# Patient Record
Sex: Male | Born: 1940 | Race: White | Hispanic: No | Smoking: Former smoker
Health system: Southern US, Community
[De-identification: ages and names within clinical notes are randomized; demographics above are authoritative.]

## PROBLEM LIST (undated history)

## (undated) DIAGNOSIS — H9190 Unspecified hearing loss, unspecified ear: Secondary | ICD-10-CM

## (undated) DIAGNOSIS — G4730 Sleep apnea, unspecified: Secondary | ICD-10-CM

## (undated) DIAGNOSIS — Z9981 Dependence on supplemental oxygen: Secondary | ICD-10-CM

## (undated) DIAGNOSIS — Z8719 Personal history of other diseases of the digestive system: Secondary | ICD-10-CM

## (undated) DIAGNOSIS — C801 Malignant (primary) neoplasm, unspecified: Secondary | ICD-10-CM

## (undated) DIAGNOSIS — I10 Essential (primary) hypertension: Secondary | ICD-10-CM

## (undated) DIAGNOSIS — J449 Chronic obstructive pulmonary disease, unspecified: Secondary | ICD-10-CM

## (undated) DIAGNOSIS — E785 Hyperlipidemia, unspecified: Secondary | ICD-10-CM

## (undated) DIAGNOSIS — I272 Pulmonary hypertension, unspecified: Secondary | ICD-10-CM

## (undated) DIAGNOSIS — R1310 Dysphagia, unspecified: Secondary | ICD-10-CM

## (undated) DIAGNOSIS — Z9989 Dependence on other enabling machines and devices: Secondary | ICD-10-CM

## (undated) DIAGNOSIS — J189 Pneumonia, unspecified organism: Secondary | ICD-10-CM

## (undated) DIAGNOSIS — J439 Emphysema, unspecified: Secondary | ICD-10-CM

## (undated) DIAGNOSIS — K219 Gastro-esophageal reflux disease without esophagitis: Secondary | ICD-10-CM

## (undated) DIAGNOSIS — N400 Enlarged prostate without lower urinary tract symptoms: Secondary | ICD-10-CM

## (undated) DIAGNOSIS — D649 Anemia, unspecified: Secondary | ICD-10-CM

## (undated) HISTORY — PX: TONSILLECTOMY: SUR1361

## (undated) HISTORY — PX: CATARACT EXTRACTION W/ INTRAOCULAR LENS  IMPLANT, BILATERAL: SHX1307

## (undated) HISTORY — PX: CARDIAC CATHETERIZATION: SHX172

## (undated) HISTORY — PX: LUNG SURGERY: SHX703

## (undated) HISTORY — DX: Emphysema, unspecified: J43.9

---

## 2013-11-22 DIAGNOSIS — K219 Gastro-esophageal reflux disease without esophagitis: Secondary | ICD-10-CM | POA: Diagnosis not present

## 2013-11-22 DIAGNOSIS — I2789 Other specified pulmonary heart diseases: Secondary | ICD-10-CM | POA: Diagnosis not present

## 2013-11-22 DIAGNOSIS — G4700 Insomnia, unspecified: Secondary | ICD-10-CM | POA: Diagnosis not present

## 2013-12-31 DIAGNOSIS — N401 Enlarged prostate with lower urinary tract symptoms: Secondary | ICD-10-CM | POA: Diagnosis not present

## 2013-12-31 DIAGNOSIS — N138 Other obstructive and reflux uropathy: Secondary | ICD-10-CM | POA: Diagnosis not present

## 2014-01-06 DIAGNOSIS — J449 Chronic obstructive pulmonary disease, unspecified: Secondary | ICD-10-CM | POA: Diagnosis not present

## 2014-01-06 DIAGNOSIS — J9610 Chronic respiratory failure, unspecified whether with hypoxia or hypercapnia: Secondary | ICD-10-CM | POA: Diagnosis not present

## 2014-01-06 DIAGNOSIS — I2789 Other specified pulmonary heart diseases: Secondary | ICD-10-CM | POA: Diagnosis not present

## 2014-01-11 DIAGNOSIS — L820 Inflamed seborrheic keratosis: Secondary | ICD-10-CM | POA: Diagnosis not present

## 2014-01-11 DIAGNOSIS — L570 Actinic keratosis: Secondary | ICD-10-CM | POA: Diagnosis not present

## 2014-01-11 DIAGNOSIS — Z85828 Personal history of other malignant neoplasm of skin: Secondary | ICD-10-CM | POA: Diagnosis not present

## 2014-01-11 DIAGNOSIS — D492 Neoplasm of unspecified behavior of bone, soft tissue, and skin: Secondary | ICD-10-CM | POA: Diagnosis not present

## 2014-01-26 DIAGNOSIS — L820 Inflamed seborrheic keratosis: Secondary | ICD-10-CM | POA: Diagnosis not present

## 2014-01-26 DIAGNOSIS — L570 Actinic keratosis: Secondary | ICD-10-CM | POA: Diagnosis not present

## 2014-02-21 DIAGNOSIS — E041 Nontoxic single thyroid nodule: Secondary | ICD-10-CM | POA: Diagnosis not present

## 2014-02-21 DIAGNOSIS — J449 Chronic obstructive pulmonary disease, unspecified: Secondary | ICD-10-CM | POA: Diagnosis not present

## 2014-02-21 DIAGNOSIS — I2789 Other specified pulmonary heart diseases: Secondary | ICD-10-CM | POA: Diagnosis not present

## 2014-02-23 DIAGNOSIS — E041 Nontoxic single thyroid nodule: Secondary | ICD-10-CM | POA: Diagnosis not present

## 2014-05-27 DIAGNOSIS — M2550 Pain in unspecified joint: Secondary | ICD-10-CM | POA: Diagnosis not present

## 2014-05-27 DIAGNOSIS — G4700 Insomnia, unspecified: Secondary | ICD-10-CM | POA: Diagnosis not present

## 2014-06-24 DIAGNOSIS — F5102 Adjustment insomnia: Secondary | ICD-10-CM | POA: Diagnosis not present

## 2014-07-08 DIAGNOSIS — J449 Chronic obstructive pulmonary disease, unspecified: Secondary | ICD-10-CM | POA: Diagnosis not present

## 2014-07-08 DIAGNOSIS — D485 Neoplasm of uncertain behavior of skin: Secondary | ICD-10-CM | POA: Diagnosis not present

## 2014-07-08 DIAGNOSIS — E041 Nontoxic single thyroid nodule: Secondary | ICD-10-CM | POA: Diagnosis not present

## 2014-08-11 DIAGNOSIS — E0789 Other specified disorders of thyroid: Secondary | ICD-10-CM | POA: Diagnosis not present

## 2014-08-11 DIAGNOSIS — E041 Nontoxic single thyroid nodule: Secondary | ICD-10-CM | POA: Diagnosis not present

## 2014-08-22 DIAGNOSIS — L219 Seborrheic dermatitis, unspecified: Secondary | ICD-10-CM | POA: Diagnosis not present

## 2014-08-22 DIAGNOSIS — L820 Inflamed seborrheic keratosis: Secondary | ICD-10-CM | POA: Diagnosis not present

## 2014-08-26 DIAGNOSIS — J449 Chronic obstructive pulmonary disease, unspecified: Secondary | ICD-10-CM | POA: Diagnosis not present

## 2014-08-26 DIAGNOSIS — I2510 Atherosclerotic heart disease of native coronary artery without angina pectoris: Secondary | ICD-10-CM | POA: Diagnosis not present

## 2014-08-26 DIAGNOSIS — R0600 Dyspnea, unspecified: Secondary | ICD-10-CM | POA: Diagnosis not present

## 2014-08-26 DIAGNOSIS — J439 Emphysema, unspecified: Secondary | ICD-10-CM | POA: Diagnosis not present

## 2014-08-26 DIAGNOSIS — Z7189 Other specified counseling: Secondary | ICD-10-CM | POA: Diagnosis not present

## 2014-09-05 DIAGNOSIS — D1801 Hemangioma of skin and subcutaneous tissue: Secondary | ICD-10-CM | POA: Diagnosis not present

## 2014-09-06 DIAGNOSIS — Z23 Encounter for immunization: Secondary | ICD-10-CM | POA: Diagnosis not present

## 2014-09-27 DIAGNOSIS — N401 Enlarged prostate with lower urinary tract symptoms: Secondary | ICD-10-CM | POA: Diagnosis not present

## 2014-09-27 DIAGNOSIS — N390 Urinary tract infection, site not specified: Secondary | ICD-10-CM | POA: Diagnosis not present

## 2014-09-30 DIAGNOSIS — F5101 Primary insomnia: Secondary | ICD-10-CM | POA: Diagnosis not present

## 2014-09-30 DIAGNOSIS — J449 Chronic obstructive pulmonary disease, unspecified: Secondary | ICD-10-CM | POA: Diagnosis not present

## 2014-09-30 DIAGNOSIS — R05 Cough: Secondary | ICD-10-CM | POA: Diagnosis not present

## 2014-10-21 DIAGNOSIS — I2510 Atherosclerotic heart disease of native coronary artery without angina pectoris: Secondary | ICD-10-CM | POA: Diagnosis not present

## 2014-10-21 DIAGNOSIS — J439 Emphysema, unspecified: Secondary | ICD-10-CM | POA: Diagnosis not present

## 2014-10-21 DIAGNOSIS — I272 Other secondary pulmonary hypertension: Secondary | ICD-10-CM | POA: Diagnosis not present

## 2014-10-21 DIAGNOSIS — J449 Chronic obstructive pulmonary disease, unspecified: Secondary | ICD-10-CM | POA: Diagnosis not present

## 2014-10-21 DIAGNOSIS — R0600 Dyspnea, unspecified: Secondary | ICD-10-CM | POA: Diagnosis not present

## 2014-10-25 DIAGNOSIS — J449 Chronic obstructive pulmonary disease, unspecified: Secondary | ICD-10-CM | POA: Diagnosis not present

## 2014-10-25 DIAGNOSIS — R0602 Shortness of breath: Secondary | ICD-10-CM | POA: Diagnosis not present

## 2014-10-31 DIAGNOSIS — L219 Seborrheic dermatitis, unspecified: Secondary | ICD-10-CM | POA: Diagnosis not present

## 2014-11-07 DIAGNOSIS — J449 Chronic obstructive pulmonary disease, unspecified: Secondary | ICD-10-CM | POA: Diagnosis not present

## 2014-11-21 DIAGNOSIS — E041 Nontoxic single thyroid nodule: Secondary | ICD-10-CM | POA: Diagnosis not present

## 2014-12-14 DIAGNOSIS — F5102 Adjustment insomnia: Secondary | ICD-10-CM | POA: Diagnosis not present

## 2015-01-23 DIAGNOSIS — E041 Nontoxic single thyroid nodule: Secondary | ICD-10-CM | POA: Diagnosis not present

## 2015-01-23 DIAGNOSIS — G4700 Insomnia, unspecified: Secondary | ICD-10-CM | POA: Diagnosis not present

## 2015-01-23 DIAGNOSIS — Z23 Encounter for immunization: Secondary | ICD-10-CM | POA: Diagnosis not present

## 2015-01-23 DIAGNOSIS — R64 Cachexia: Secondary | ICD-10-CM | POA: Diagnosis not present

## 2015-01-23 DIAGNOSIS — I272 Other secondary pulmonary hypertension: Secondary | ICD-10-CM | POA: Diagnosis not present

## 2015-02-13 DIAGNOSIS — D485 Neoplasm of uncertain behavior of skin: Secondary | ICD-10-CM | POA: Diagnosis not present

## 2015-02-13 DIAGNOSIS — Z85828 Personal history of other malignant neoplasm of skin: Secondary | ICD-10-CM | POA: Diagnosis not present

## 2015-02-13 DIAGNOSIS — D2262 Melanocytic nevi of left upper limb, including shoulder: Secondary | ICD-10-CM | POA: Diagnosis not present

## 2015-02-13 DIAGNOSIS — I781 Nevus, non-neoplastic: Secondary | ICD-10-CM | POA: Diagnosis not present

## 2015-02-13 DIAGNOSIS — L570 Actinic keratosis: Secondary | ICD-10-CM | POA: Diagnosis not present

## 2015-02-13 DIAGNOSIS — C44519 Basal cell carcinoma of skin of other part of trunk: Secondary | ICD-10-CM | POA: Diagnosis not present

## 2015-02-13 DIAGNOSIS — L814 Other melanin hyperpigmentation: Secondary | ICD-10-CM | POA: Diagnosis not present

## 2015-02-13 DIAGNOSIS — D1801 Hemangioma of skin and subcutaneous tissue: Secondary | ICD-10-CM | POA: Diagnosis not present

## 2015-02-17 DIAGNOSIS — J449 Chronic obstructive pulmonary disease, unspecified: Secondary | ICD-10-CM | POA: Diagnosis not present

## 2015-02-17 DIAGNOSIS — K219 Gastro-esophageal reflux disease without esophagitis: Secondary | ICD-10-CM | POA: Diagnosis not present

## 2015-02-17 DIAGNOSIS — J439 Emphysema, unspecified: Secondary | ICD-10-CM | POA: Diagnosis not present

## 2015-02-17 DIAGNOSIS — Z87891 Personal history of nicotine dependence: Secondary | ICD-10-CM | POA: Diagnosis not present

## 2015-02-17 DIAGNOSIS — R0902 Hypoxemia: Secondary | ICD-10-CM | POA: Diagnosis not present

## 2015-02-17 DIAGNOSIS — I272 Other secondary pulmonary hypertension: Secondary | ICD-10-CM | POA: Diagnosis not present

## 2015-02-27 DIAGNOSIS — L929 Granulomatous disorder of the skin and subcutaneous tissue, unspecified: Secondary | ICD-10-CM | POA: Diagnosis not present

## 2015-02-27 DIAGNOSIS — L570 Actinic keratosis: Secondary | ICD-10-CM | POA: Diagnosis not present

## 2015-02-27 DIAGNOSIS — C44519 Basal cell carcinoma of skin of other part of trunk: Secondary | ICD-10-CM | POA: Diagnosis not present

## 2015-02-27 DIAGNOSIS — D485 Neoplasm of uncertain behavior of skin: Secondary | ICD-10-CM | POA: Diagnosis not present

## 2015-03-29 DIAGNOSIS — N400 Enlarged prostate without lower urinary tract symptoms: Secondary | ICD-10-CM | POA: Diagnosis not present

## 2015-03-29 DIAGNOSIS — N390 Urinary tract infection, site not specified: Secondary | ICD-10-CM | POA: Diagnosis not present

## 2015-03-29 DIAGNOSIS — Z125 Encounter for screening for malignant neoplasm of prostate: Secondary | ICD-10-CM | POA: Diagnosis not present

## 2015-04-26 DIAGNOSIS — E041 Nontoxic single thyroid nodule: Secondary | ICD-10-CM | POA: Diagnosis not present

## 2015-04-26 DIAGNOSIS — Z6825 Body mass index (BMI) 25.0-25.9, adult: Secondary | ICD-10-CM | POA: Diagnosis not present

## 2015-04-26 DIAGNOSIS — E559 Vitamin D deficiency, unspecified: Secondary | ICD-10-CM | POA: Diagnosis not present

## 2015-04-26 DIAGNOSIS — Z125 Encounter for screening for malignant neoplasm of prostate: Secondary | ICD-10-CM | POA: Diagnosis not present

## 2015-04-26 DIAGNOSIS — F419 Anxiety disorder, unspecified: Secondary | ICD-10-CM | POA: Diagnosis not present

## 2015-04-26 DIAGNOSIS — Z131 Encounter for screening for diabetes mellitus: Secondary | ICD-10-CM | POA: Diagnosis not present

## 2015-04-26 DIAGNOSIS — N400 Enlarged prostate without lower urinary tract symptoms: Secondary | ICD-10-CM | POA: Diagnosis not present

## 2015-04-26 DIAGNOSIS — E784 Other hyperlipidemia: Secondary | ICD-10-CM | POA: Diagnosis not present

## 2015-04-26 DIAGNOSIS — Z79899 Other long term (current) drug therapy: Secondary | ICD-10-CM | POA: Diagnosis not present

## 2015-04-26 DIAGNOSIS — J449 Chronic obstructive pulmonary disease, unspecified: Secondary | ICD-10-CM | POA: Diagnosis not present

## 2015-04-26 DIAGNOSIS — I272 Other secondary pulmonary hypertension: Secondary | ICD-10-CM | POA: Diagnosis not present

## 2015-04-26 DIAGNOSIS — N401 Enlarged prostate with lower urinary tract symptoms: Secondary | ICD-10-CM | POA: Diagnosis not present

## 2015-04-26 DIAGNOSIS — I10 Essential (primary) hypertension: Secondary | ICD-10-CM | POA: Diagnosis not present

## 2015-04-26 DIAGNOSIS — R5383 Other fatigue: Secondary | ICD-10-CM | POA: Diagnosis not present

## 2015-04-28 DIAGNOSIS — E042 Nontoxic multinodular goiter: Secondary | ICD-10-CM | POA: Diagnosis not present

## 2015-04-28 DIAGNOSIS — E041 Nontoxic single thyroid nodule: Secondary | ICD-10-CM | POA: Diagnosis not present

## 2015-05-29 DIAGNOSIS — K5900 Constipation, unspecified: Secondary | ICD-10-CM | POA: Diagnosis not present

## 2015-05-29 DIAGNOSIS — Z6825 Body mass index (BMI) 25.0-25.9, adult: Secondary | ICD-10-CM | POA: Diagnosis not present

## 2015-05-29 DIAGNOSIS — F5101 Primary insomnia: Secondary | ICD-10-CM | POA: Diagnosis not present

## 2015-05-29 DIAGNOSIS — R630 Anorexia: Secondary | ICD-10-CM | POA: Diagnosis not present

## 2015-05-29 DIAGNOSIS — E041 Nontoxic single thyroid nodule: Secondary | ICD-10-CM | POA: Diagnosis not present

## 2015-06-26 DIAGNOSIS — D440 Neoplasm of uncertain behavior of thyroid gland: Secondary | ICD-10-CM | POA: Diagnosis not present

## 2015-06-26 DIAGNOSIS — E042 Nontoxic multinodular goiter: Secondary | ICD-10-CM | POA: Diagnosis not present

## 2015-07-31 DIAGNOSIS — L905 Scar conditions and fibrosis of skin: Secondary | ICD-10-CM | POA: Diagnosis not present

## 2015-07-31 DIAGNOSIS — D1801 Hemangioma of skin and subcutaneous tissue: Secondary | ICD-10-CM | POA: Diagnosis not present

## 2015-07-31 DIAGNOSIS — Z85828 Personal history of other malignant neoplasm of skin: Secondary | ICD-10-CM | POA: Diagnosis not present

## 2015-07-31 DIAGNOSIS — L570 Actinic keratosis: Secondary | ICD-10-CM | POA: Diagnosis not present

## 2015-07-31 DIAGNOSIS — L814 Other melanin hyperpigmentation: Secondary | ICD-10-CM | POA: Diagnosis not present

## 2015-08-01 DIAGNOSIS — Z23 Encounter for immunization: Secondary | ICD-10-CM | POA: Diagnosis not present

## 2015-08-17 DIAGNOSIS — K219 Gastro-esophageal reflux disease without esophagitis: Secondary | ICD-10-CM | POA: Diagnosis not present

## 2015-08-17 DIAGNOSIS — I272 Other secondary pulmonary hypertension: Secondary | ICD-10-CM | POA: Diagnosis not present

## 2015-08-17 DIAGNOSIS — J439 Emphysema, unspecified: Secondary | ICD-10-CM | POA: Diagnosis not present

## 2015-08-17 DIAGNOSIS — R0902 Hypoxemia: Secondary | ICD-10-CM | POA: Diagnosis not present

## 2015-08-21 DIAGNOSIS — L820 Inflamed seborrheic keratosis: Secondary | ICD-10-CM | POA: Diagnosis not present

## 2015-08-21 DIAGNOSIS — L720 Epidermal cyst: Secondary | ICD-10-CM | POA: Diagnosis not present

## 2015-08-21 DIAGNOSIS — R208 Other disturbances of skin sensation: Secondary | ICD-10-CM | POA: Diagnosis not present

## 2015-08-24 DIAGNOSIS — R509 Fever, unspecified: Secondary | ICD-10-CM | POA: Diagnosis not present

## 2015-08-24 DIAGNOSIS — B349 Viral infection, unspecified: Secondary | ICD-10-CM | POA: Diagnosis not present

## 2015-08-24 DIAGNOSIS — R0602 Shortness of breath: Secondary | ICD-10-CM | POA: Diagnosis not present

## 2015-08-28 DIAGNOSIS — F5101 Primary insomnia: Secondary | ICD-10-CM | POA: Diagnosis not present

## 2015-08-28 DIAGNOSIS — Z6825 Body mass index (BMI) 25.0-25.9, adult: Secondary | ICD-10-CM | POA: Diagnosis not present

## 2015-09-15 DIAGNOSIS — I10 Essential (primary) hypertension: Secondary | ICD-10-CM | POA: Diagnosis not present

## 2015-09-15 DIAGNOSIS — E785 Hyperlipidemia, unspecified: Secondary | ICD-10-CM | POA: Diagnosis not present

## 2015-09-15 DIAGNOSIS — G4700 Insomnia, unspecified: Secondary | ICD-10-CM | POA: Diagnosis not present

## 2015-09-15 DIAGNOSIS — I272 Other secondary pulmonary hypertension: Secondary | ICD-10-CM | POA: Diagnosis not present

## 2015-09-15 DIAGNOSIS — N400 Enlarged prostate without lower urinary tract symptoms: Secondary | ICD-10-CM | POA: Diagnosis not present

## 2015-09-15 DIAGNOSIS — E041 Nontoxic single thyroid nodule: Secondary | ICD-10-CM | POA: Diagnosis not present

## 2015-09-15 DIAGNOSIS — Z23 Encounter for immunization: Secondary | ICD-10-CM | POA: Diagnosis not present

## 2015-09-15 DIAGNOSIS — Z79899 Other long term (current) drug therapy: Secondary | ICD-10-CM | POA: Diagnosis not present

## 2015-09-15 DIAGNOSIS — K219 Gastro-esophageal reflux disease without esophagitis: Secondary | ICD-10-CM | POA: Diagnosis not present

## 2015-09-15 DIAGNOSIS — J439 Emphysema, unspecified: Secondary | ICD-10-CM | POA: Diagnosis not present

## 2015-10-02 ENCOUNTER — Ambulatory Visit (INDEPENDENT_AMBULATORY_CARE_PROVIDER_SITE_OTHER): Payer: Medicare Other | Admitting: Pulmonary Disease

## 2015-10-02 ENCOUNTER — Encounter: Payer: Self-pay | Admitting: Pulmonary Disease

## 2015-10-02 ENCOUNTER — Ambulatory Visit (INDEPENDENT_AMBULATORY_CARE_PROVIDER_SITE_OTHER)
Admission: RE | Admit: 2015-10-02 | Discharge: 2015-10-02 | Disposition: A | Discharge status: Home / Self Care | Payer: Medicare Other | Source: Ambulatory Visit | Attending: Pulmonary Disease | Admitting: Pulmonary Disease

## 2015-10-02 VITALS — BP 134/68 | HR 81 | Temp 98.1°F | Ht 67.0 in | Wt 155.2 lb

## 2015-10-02 DIAGNOSIS — I272 Other secondary pulmonary hypertension: Secondary | ICD-10-CM

## 2015-10-02 DIAGNOSIS — J438 Other emphysema: Secondary | ICD-10-CM

## 2015-10-02 DIAGNOSIS — Z8679 Personal history of other diseases of the circulatory system: Secondary | ICD-10-CM

## 2015-10-02 DIAGNOSIS — J9611 Chronic respiratory failure with hypoxia: Secondary | ICD-10-CM

## 2015-10-02 DIAGNOSIS — J439 Emphysema, unspecified: Secondary | ICD-10-CM | POA: Insufficient documentation

## 2015-10-02 DIAGNOSIS — R0600 Dyspnea, unspecified: Secondary | ICD-10-CM

## 2015-10-02 NOTE — Patient Instructions (Signed)
Jaren-- it was a pleasure meeting you today!  We discussed establishing a baseline data base for your lung condition>    We checked a CXR, Spriometry breathing test, and an ambulatory oxygen test today...    We will check your Alpha-1-antitrypsin blood level due to your bullous emphysema history...    We will check a follow up 2DEchocardiogram to get an estimate of your pulmonary artery pressure...         We will contact you w/ the results when available...   For now we will continue your stable pulmonary regien>>    [id number]- one inhalation twice daily...    SPIRIVA- inhale the contents of 1 capsule via handihaler daily...    OXYGEN- 2L/min via concentrator w/ pulse-dose administration...    BiPAP as you have been doing w/ settings per [facility]...    REVATIO 52m- one tab thrre times daily as you have done for years...  We will attempt to get your pertinent O[place]   ([place] of [place] we need this info as well)  Call for any questions...  Let's plan a follow up visit in [id number] if needed for problems...Marland KitchenMarland Kitchen

## 2015-10-02 NOTE — Progress Notes (Addendum)
Subjective:     Patient ID: Andrew [name], male   DOB: 1941-06-27, 74 y.o.   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  He describes himself as being rock-solid stable on this exact regimen since 2007- he had Cath (?left & right heart) in 2007, told 1 blockage, good LVF ?right heart results, started on O2, BiPAP, and Revatio but he does not know why?  He notes min cough when supine & in early AM attributed to reflux; min if any sput production, no hemoptysis, he denies SOB but states DOE "if I over-exert" eg- walking, lifting/carrying, stairs, etc; he notes that ADLs are ok- no problem (he is stoic);  He denies CP, palpit, f/c/s, edema... He hasn't been to an ER since 2007 he says & that was also the last time he had any Pred; he thinks he had CXR, PFT, 2DEcho all earlier this yr...    Smoking Hx>  He is an ex-smoker, started in his teens, smoked for 30 yrs up to 1ppd, quit in 1986; This is a 30 pack-yr hx, he does not recall ever being checked for A1AT defic...  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 at 2L/min since 2007;  He tells me that he was started on BiPAP about that same time but he doesn't know why- never had sleep study, not on CPAP prev, he does not know about pCO2 levels etc ("I like the fresh cool air");  His BiPAP came from [facility] in [place]- states he does not know the settings, machine never downloaded, etc;  He has also been on Revatio20Tid since 2007, apparently never tried on other meds, dose never adjusted- he knows about "pulmonary hypertension" but he doesn't know any details and it doesn't appear to have been followed up, and meds kept the same from doctor to doctor...   Medical Hx>  HBP, ?nonobstructive CAD, HL, thyroid nodule, GERD, constipation, BPH, insomnia...  Family Hx>  Father died w/ Emphysema & was a former smoker; no other hx lung dis in the family; Alpha-1 status is unknown...  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   Current Meds>  Oxygen 2L/min pulse-dose concentrator, Advair250Bid, Spiriva daily, Revatio20Tid, CardizemCD240,  Crestor10, Nexium20, Proscar5, Restoril30...   EXAM shows Afeb, VSS, O2sat=93% on 2L/min pulse-dose;  Heent- neg, mallampati1;  Chest- decr BS at bases, can't augment BS voluntarily, w/o w/r/r;  Heart- RR w/o m/r/g;  Abd- soft, neg;  Ext- neg w/o c/c/e;  Neuro- intact...  CXR 10/02/15 showed norm heart size, COPD, bullous emphysema/ hyperinflation, scarring right apex, NAD...   Spirometry 10/02/15 showed FVC=2.70 (69%), FEV1=1.12 (37%), %1sec=41, mid-flows reduced at 18% predicted; this is c/w severe airflow obstruction & GOLD Stage 3 COPD  Ambulatory oxygen saturation test 10/02/15> on O2 at 2L/min pulse-dose concentrator: O2sat=96% on 2L at rest w/ pulse=87; he walked 2 laps w/ his  O2, stopped due to dyspnea, lowest O2sat=89% w/ pulse=113/min...  LAB 10/02/15>  Alpha-1-Antitrypsin level => pending  2DEchocardiogram 10/09/15 showed norm LVF w/ EF=55-60%, norm wall motion, mild MR, mild RA dil, PAsys est59mmHg... Pt on Revatio 20Tid x 59yrs & I rec we wean slowly (Decr to Bid now)...     IMP >>     COPD/ bullous emphysema> s/p RUL "bleb-ectomy" in 2007, severe airflow obstruction w/ GOLD Stage3 COPD> on Advair250Bid & Spiriva daily; apparently he has no use for a rescue inhaler; prev on NEB w/ Albut but not for several yrs.     Chronic hypoxemic respiratory failure on O2 at 2L/min via pulse-dose concentrator...    Pt reports using BiPAP since 2007, never been on CPAP, never had sleep study he says, unknown ABGs or pCO2 data; machine from [place] we will try to get the old data...    Hx pulmonary hypertension on Revatio20Tid since 2007 w/o additional med trials or dose adjustments> we do not have any of the objective data from his prev physician teams... Current 2DEcho w/ PAsys est 29mmHg 7 we will wean the Revatio to Bid at this point...    Medical issues include:  HBP, ?nonobstructive CAD, HL, thyroid nodule, GERD, BPH, insomnia...  PLAN >>     Andrew [name] has a distinctly patchy history to go along w/ his severe airflow obstruction & bullous emphysema;  We really need his old objective data from 2007 when he was started on O2 & BiPAP after his RUL bleb reduction surg;  He will try to get names and numbers for [place]- in the meanwhile we will contact his last physician in [facility] for their more recent data as we establish out data base here in [place];  He is very concerned that he wants [place] to continue his current regimen which has served him well over the last 38yrs;  Continue Advair250Bid, Spiriva daily, Revatio20tid, O2 at 2L/min, and the BiPAP nightly as currently set... We plan ROV recheck in 1 month... NOTE> 2DEcho w/ PAsys est ~58mmHg & we will slowly wean  Revatio...    Past Medical History  Diagnosis Date  . Emphysema lung (HCC)   Medical Hx>  HBP, ?nonobstructive CAD, HL, thyroid nodule, GERD, constipation, BPH, insomnia...   No past surgical history on file. Hx Thoracotomy & RUL Bleb-ectomy 2007 in [place], [place]...   Outpatient Encounter Prescriptions as of 10/02/2015  Medication Sig  . ADVAIR DISKUS 250-50 MCG/DOSE AEPB 1 puff 2 (two) times daily.  Marland Kitchen diltiazem (CARDIZEM LA) 120 MG 24 hr tablet Take 240 mg by mouth daily.  Marland Kitchen esomeprazole (NEXIUM) 20 MG capsule Take 20 mg by mouth daily at 12 noon.  . finasteride (PROSCAR) 5 MG tablet Take 10 mg by mouth daily.  . rosuvastatin (CRESTOR) 10 MG tablet Take 10 mg by mouth  daily.  . sildenafil (REVATIO) 20 MG tablet Take 20 mg by mouth 3 (three) times daily.  Marland Kitchen SPIRIVA HANDIHALER 18 MCG inhalation capsule Place 1 capsule into inhaler and inhale daily.  . temazepam (RESTORIL) 30 MG capsule Take 30 mg by mouth at bedtime.   No facility-administered encounter medications on file as of 10/02/2015.    No Known Allergies   Family History  Problem Relation Age of Onset  . Bone cancer Maternal Uncle   Father died at 80 from emphysema, he was a smoker... Mother died at 72 from pancreatic cancer... He has 2 half-brothers and 3 half-sisters; no hx lung diseases, one Bro w/ prostate Ca...   Social History   Social History  . Marital Status: Divorced    Spouse Name: N/A  . Number of Children: N/A  . Years of Education: N/A   Occupational History  . Not on file.   Social History Main Topics  . Smoking status: Former Smoker -- 1.00 packs/day for 30 years    Types: Cigarettes    Quit date: 103  . Smokeless tobacco: Not on file  . Alcohol Use: Not on file  . Drug Use: Not on file  . Sexual Activity: Not on file   Other Topics Concern  . Not on file   Social History Narrative  . No narrative on file    Current Medications, Allergies, Past Medical History, Past Surgical  History, Family History, and Social History were reviewed in [facility] record.   Review of Systems             All symptoms NEG except where BOLDED >>  Constitutional:  F/C/S, fatigue, anorexia, unexpected weight change. HEENT:  HA, visual changes, hearing loss, earache, nasal symptoms, sore throat, mouth sores, hoarseness. Resp:  cough, sputum, hemoptysis; SOB, tightness, wheezing. Cardio:  CP, palpit, DOE, orthopnea, edema. GI:  N/V/D/C, blood in stool; reflux, abd pain, distention, gas. GU:  dysuria, freq, urgency, hematuria, flank pain, voiding difficulty. MS:  joint pain, swelling, tenderness, decr ROM; neck pain, back pain, etc. Neuro:  HA, tremors, seizures, dizziness, syncope, weakness, numbness, gait abn. Skin:  suspicious lesions or skin rash. Heme:  adenopathy, bruising, bleeding. Psyche:  confusion, agitation, sleep disturbance, hallucinations, anxiety, depression suicidal.   Objective:   Physical Exam       Vital Signs:  Reviewed...  General:  WD, WN, 74 y/o WM in NAD; alert & oriented; pleasant & cooperative... HEENT:  [place]/AT; Conjunctiva- pink, Sclera- nonicteric, EOM-wnl, PERRLA, EACs-clear, TMs-wnl; NOSE-clear; THROAT-clear & wnl. Neck:  Supple w/ fair ROM; no JVD; normal carotid impulses w/o bruits; no thyromegaly +small nodule palpated; no lymphadenopathy. Chest:  Overinflated, resonant percussion note, decr BS bilat & can't augment BS voluntarily, no w/r/r heard... Heart:  Regular Rhythm; norm S1 & S2 without murmurs, rubs, or gallops detected. Abdomen:  Soft & nontender- no guarding or rebound; normal bowel sounds; no organomegaly or masses palpated. Ext:  decr ROM; without deformities or arthritic changes; no varicose veins, +venous insuffic, no edema;  Pulses intact w/o bruits. Neuro:  No focal neuro deficits; sensory testing normal; gait normal & balance OK. Derm:  No lesions noted; no rash etc. Lymph:  No cervical, supraclavicular,  axillary, or inguinal adenopathy palpated.   Assessment:      IMP >>     COPD/ bullous emphysema> s/p RUL "bleb-ectomy" in 2007, severe airflow obstruction w/ GOLD Stage3 COPD> on Advair250Bid & Spiriva daily; apparently he has no use for a  rescue inhaler; prev on NEB w/ Albut but not for several yrs.     Chronic hypoxemic respiratory failure on O2 at 2L/min via pulse-dose concentrator...    Pt reports using BiPAP since 2007, never been on CPAP, never had sleep study he says, unknown ABGs or pCO2 data; machine from [place] we will try to get the old data...    Hx pulmonary hypertension on Revatio20Tid since 2007 w/o additional med trials or dose adjustments> we do not have any of the objective data from his prev physician teams...    Medical issues include:  HBP, ?nonobstructive CAD, HL, thyroid nodule, GERD, BPH, insomnia...  PLAN >>     Andrew [name] has a distinctly patchy history to go along w/ his severe airflow obstruction & bullous emphysema;  We really need his old objective data from 2007 when he was started on O2 & BiPAP after his RUL bleb reduction surg;  He will try to get names and numbers for [place]- in the meanwhile we will contact his last physician in [facility] for their more recent data as we establish out data base here in [place];  He is very concerned that he wants [place] to continue his current regimen which has served him well over the last 79yrs;  Continue Advair250Bid, Spiriva daily, Revatio20tid, O2 at 2L/min, and the BiPAP nightly as currently set... We plan ROV recheck in 1 month...     Plan:     Patient's Medications  New Prescriptions   No medications on file  Previous Medications   ADVAIR DISKUS 250-50 MCG/DOSE AEPB    1 puff 2 (two) times daily.   DILTIAZEM (CARDIZEM LA) 120 MG 24 HR TABLET    Take 240 mg by mouth daily.   ESOMEPRAZOLE (NEXIUM) 20 MG CAPSULE    Take 20 mg by mouth daily at 12 noon.   FINASTERIDE (PROSCAR) 5 MG TABLET    Take 10 mg by mouth daily.    ROSUVASTATIN (CRESTOR) 10 MG TABLET    Take 10 mg by mouth daily.   SILDENAFIL (REVATIO) 20 MG TABLET    Take 20 mg by mouth 3 (three) times daily.   SPIRIVA HANDIHALER 18 MCG INHALATION CAPSULE    Place 1 capsule into inhaler and inhale daily.   TEMAZEPAM (RESTORIL) 30 MG CAPSULE    Take 30 mg by mouth at bedtime.  Modified Medications   No medications on file  Discontinued Medications   No medications on file

## 2015-10-09 ENCOUNTER — Other Ambulatory Visit: Payer: Self-pay

## 2015-10-09 ENCOUNTER — Ambulatory Visit (HOSPITAL_COMMUNITY): Payer: Medicare Other | Attending: Cardiology

## 2015-10-09 DIAGNOSIS — Z8679 Personal history of other diseases of the circulatory system: Secondary | ICD-10-CM

## 2015-10-09 DIAGNOSIS — J9611 Chronic respiratory failure with hypoxia: Secondary | ICD-10-CM

## 2015-10-09 DIAGNOSIS — I272 Other secondary pulmonary hypertension: Secondary | ICD-10-CM | POA: Diagnosis not present

## 2015-10-09 DIAGNOSIS — I071 Rheumatic tricuspid insufficiency: Secondary | ICD-10-CM | POA: Diagnosis not present

## 2015-10-09 DIAGNOSIS — I340 Nonrheumatic mitral (valve) insufficiency: Secondary | ICD-10-CM | POA: Diagnosis not present

## 2015-10-09 DIAGNOSIS — J438 Other emphysema: Secondary | ICD-10-CM

## 2015-10-09 DIAGNOSIS — R0600 Dyspnea, unspecified: Secondary | ICD-10-CM | POA: Diagnosis not present

## 2015-10-10 ENCOUNTER — Other Ambulatory Visit (HOSPITAL_COMMUNITY): Payer: Medicare Other

## 2015-10-10 DIAGNOSIS — D485 Neoplasm of uncertain behavior of skin: Secondary | ICD-10-CM | POA: Diagnosis not present

## 2015-10-10 DIAGNOSIS — D0461 Carcinoma in situ of skin of right upper limb, including shoulder: Secondary | ICD-10-CM | POA: Diagnosis not present

## 2015-10-10 DIAGNOSIS — L570 Actinic keratosis: Secondary | ICD-10-CM | POA: Diagnosis not present

## 2015-10-10 DIAGNOSIS — J439 Emphysema, unspecified: Secondary | ICD-10-CM | POA: Diagnosis not present

## 2015-10-10 DIAGNOSIS — I10 Essential (primary) hypertension: Secondary | ICD-10-CM | POA: Diagnosis not present

## 2015-10-10 DIAGNOSIS — D225 Melanocytic nevi of trunk: Secondary | ICD-10-CM | POA: Diagnosis not present

## 2015-10-10 DIAGNOSIS — L821 Other seborrheic keratosis: Secondary | ICD-10-CM | POA: Diagnosis not present

## 2015-10-19 NOTE — Progress Notes (Signed)
Quick Note:  Called and spoke with patient. Informed him of results and recs. Patient voiced understanding and had no further questions. ______ 

## 2015-10-30 DIAGNOSIS — D045 Carcinoma in situ of skin of trunk: Secondary | ICD-10-CM | POA: Diagnosis not present

## 2015-11-01 ENCOUNTER — Encounter: Payer: Self-pay | Admitting: Pulmonary Disease

## 2015-11-01 ENCOUNTER — Telehealth: Payer: Self-pay | Admitting: Pulmonary Disease

## 2015-11-01 ENCOUNTER — Ambulatory Visit (INDEPENDENT_AMBULATORY_CARE_PROVIDER_SITE_OTHER): Payer: Medicare Other | Admitting: Pulmonary Disease

## 2015-11-01 VITALS — BP 132/60 | HR 83 | Temp 97.6°F | Wt 150.0 lb

## 2015-11-01 DIAGNOSIS — J438 Other emphysema: Secondary | ICD-10-CM | POA: Diagnosis not present

## 2015-11-01 DIAGNOSIS — I272 Other secondary pulmonary hypertension: Secondary | ICD-10-CM | POA: Diagnosis not present

## 2015-11-01 DIAGNOSIS — Z8679 Personal history of other diseases of the circulatory system: Secondary | ICD-10-CM | POA: Diagnosis not present

## 2015-11-01 DIAGNOSIS — R0600 Dyspnea, unspecified: Secondary | ICD-10-CM

## 2015-11-01 DIAGNOSIS — J9611 Chronic respiratory failure with hypoxia: Secondary | ICD-10-CM

## 2015-11-01 MED ORDER — ADVAIR DISKUS 250-50 MCG/DOSE IN AEPB: 1.0000 | INHALATION_SPRAY | Freq: Two times a day (BID) | RESPIRATORY_TRACT | Status: DC

## 2015-11-01 MED ORDER — AZITHROMYCIN 250 MG PO TABS: ORAL_TABLET | ORAL | Status: DC

## 2015-11-01 MED ORDER — SPIRIVA HANDIHALER 18 MCG IN CAPS: 1.0000 | ORAL_CAPSULE | Freq: Every day | RESPIRATORY_TRACT | Status: DC

## 2015-11-01 NOTE — Telephone Encounter (Signed)
Orders cancelled at local pharmacy and submitted to [facility] per request  Nothing further is needed

## 2015-11-01 NOTE — Patient Instructions (Signed)
Today we updated your med list in our EPIC system...    Continue your current medications the same...  We added a ZPak to use during the winter months as needed for infection...  Call for any questions...  Let's plan a follow up visit in 3-62mo, sooner if needed for problems.Marland KitchenMarland Kitchen

## 2015-11-01 NOTE — Progress Notes (Signed)
Subjective:     Patient ID: Andrew [name], male   DOB: 09/18/1941, 74 y.o.   MRN: [id number]  HPI  ~  October 02, 2015:  Initial pulmonary evaluation by SN>  His PCP is Dr. Lujean Amel, [name]...       Andrew [name] is a 74 y/o gentleman from [place]- moved here to [place] ~40mo ago to live w/ his daughter & son-in-law;  He has a long convoluted history & we have none of his prev objective data to review;  He tells me that he has known about COPD/Emphysema for 74 yrs and in 2007 he had right thoracotomy & "bleb-ectomy";  After this procedure he was placed on Oxygen at 2L/min and BiPAP to use at night, along w/ ADVAIR250Bid & SPIRIVA daily, plus REVATIO20Tid for pulmonary HTN;  He was also treated w/ NEBS for about 1-64yrs then this was discontinued;  He has pretty much been on this same regimen for the past 9 yrs w/o much change, despite or maybe because he has moved around a lot- [place] (surg done there in 2007), to [place], back to [place], on to [place] for 6 yrs, then back to [place] over the last 74 yr or so...  He describes himself as being rock-solid stable on this exact regimen since 2007- he had Cath (?left & right heart) in 2007, told 1 blockage, good LVF ?right heart results, started on O2, BiPAP, and Revatio but he does not know why?  He notes min cough when supine & in early AM attributed to reflux; min if any sput production, no hemoptysis, he denies SOB but states DOE "if I over-exert" eg- walking, lifting/carrying, stairs, etc; he notes that ADLs are ok- no problem (he is stoic);  He denies CP, palpit, f/c/s, edema... He hasn't been to an ER since 2007 he says & that was also the last time he had any Pred; he thinks he had CXR, PFT, 2DEcho all earlier this yr...   Smoking Hx>  He is an ex-smoker, started in his teens, smoked for 30 yrs up to 1ppd, quit in 1986; This is a 30 pack-yr hx, he does not recall ever being checked for A1AT defic...  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 at 2L/min since 2007;  He tells me that he was started on BiPAP about that same time but he doesn't know why- never had sleep study, not on CPAP prev, he does not know about pCO2 levels etc ("I like the fresh cool air");  His BiPAP came from [facility] in [place]- states he does not know the settings, machine never downloaded, etc;  He has also been on Revatio20Tid since 2007, apparently never tried on other meds, dose never adjusted- he knows about "pulmonary hypertension" but he doesn't know any details and it doesn't appear to have been followed up, and meds kept the same from doctor to doctor...   Medical Hx>  HBP, ?nonobstructive CAD, HL, thyroid nodule, GERD, constipation, BPH, insomnia...  Family Hx>  Father died w/ Emphysema & was a former smoker; no other hx lung dis in the family; Alpha-1 status is unknown...  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   Current Meds>  Oxygen  2L/min pulse-dose concentrator, Advair250Bid, Spiriva daily, Revatio20Tid, CardizemCD240, Crestor10, Nexium20, Proscar5, Restoril30...  EXAM shows Afeb, VSS, O2sat=93% on 2L/min pulse-dose;  Heent- neg, mallampati1;  Chest- decr BS at bases, can't augment BS voluntarily, w/o w/r/r;  Heart- RR w/o m/r/g;  Abd- soft, neg;  Ext- neg w/o c/c/e;  Neuro- intact...  CXR 10/02/15 showed norm heart size, COPD, bullous emphysema/ hyperinflation, scarring right apex, NAD...   Spirometry 10/02/15 showed FVC=2.70 (69%), FEV1=1.12 (37%), %1sec=41, mid-flows reduced at 18% predicted; this is c/w severe airflow obstruction & GOLD Stage 3 COPD  Ambulatory oxygen saturation test 10/02/15> on O2 at 2L/min pulse-dose concentrator: O2sat=96% on 2L  at rest w/ pulse=87; he walked 2 laps w/ his O2, stopped due to dyspnea, lowest O2sat=89% w/ pulse=113/min...  LAB 10/02/15>  Alpha-1-Antitrypsin level => pending (he never went to the lab for this blood test)  2DEchocardiogram 10/09/15 showed norm LVF w/ EF=55-60%, norm wall motion, mild MR, mild RA dil, PAsys est 43mmHg... Pt on Revatio 20Tid x 74yrs & I rec we wean slowly (Decr to Bid now)...    IMP >>     COPD/ bullous emphysema> s/p RUL "bleb-ectomy" in 2007, severe airflow obstruction w/ GOLD Stage3 COPD> on Advair250Bid & Spiriva daily; apparently he has no use for a rescue inhaler; prev on NEB w/ Albut but not for several yrs.     Chronic hypoxemic respiratory failure on O2 at 2L/min via pulse-dose concentrator...    Pt reports using BiPAP since 2007, never been on CPAP, never had sleep study he says, unknown ABGs or pCO2 data; machine from [facility] we will try to get the old data => none received.    Hx pulmonary hypertension on Revatio20Tid since 2007 w/o additional med trials or dose adjustments> we do not have any of the objective data from his prev physician teams... Current 2DEcho w/ PAsys est 103mmHg & we will wean the Revatio to Bid at this point => he does not want to wean further for "other" reasons...    Medical issues include:  HBP, ?nonobstructive CAD, HL, thyroid nodule, GERD, BPH, insomnia... PLAN >>     Andrew [name] has a distinctly patchy history to go along w/ his severe airflow obstruction & bullous emphysema;  We really need his old objective data from 2007 when he was started on O2 & BiPAP after his RUL bleb reduction surg;  He will try to get names and numbers for [place]- in the meanwhile we will contact his last physician in [facility] for their more recent data as we establish out data base here in [place];  He is very concerned that he wants [place] to continue his current regimen which has served him well over the last 76yrs;  Continue Advair250Bid, Spiriva daily,  Revatio20Tid=>Bid, O2 at 2L/min, and the BiPAP nightly as currently set... We plan ROV recheck in 1 month... NOTE> 2DEcho w/ PAsys est ~61mmHg & we will slowly wean Revatio...  ~  November 01, 2015:  81mo ROV & Andrew [name] reports stable, doing satis & notes no untoward effects from cutting the Revatio to 20mg Bid; he denies CP, palpit, incr SOB, edema, etc; he continues on the O2 at 2L/min, BiPAP from [facility], Advair250Bid & Spiriva once daily; we have not received any records from his mult physicians ([place], [place], [place])...    EXAM shows Afeb, VSS, O2sat=92% on 2L/min pulse-dose;  Heent- neg, mallampati1;  Chest- decr BS at bases, can't augment BS voluntarily, w/o w/r/r;  Heart- RR w/o m/r/g;  Abd-  soft, neg;  Ext- neg w/o c/c/e;  Neuro- intact... IMP/PLAN>>  See prob list above- he is stable on this regimen but does not want to wean the Revatio further for "other" reasons; OK to continue current med regimen- advised regular exercise vs pulm rehab program; given ZPak for prn use over the winter & he knows to call for any resp issues, incr dyspnea, etc... He remains on BiPAP but we do not have any data- no records received from prev pulm physicians, no notes from [place], no download data from his machine- we will again try to make contact w/ his DME company... We plan ROV in 3-22mo.    Past Medical History  Diagnosis Date  . Emphysema lung (HCC)   Medical Hx>  HBP, ?nonobstructive CAD, HL, thyroid nodule, GERD, constipation, BPH, insomnia... Meds include>  CardizemCD240, Crestor10, Nexium20, Miralax, Proscar10, Restoril30...   No past surgical history on file. Hx Thoracotomy & RUL Bleb-ectomy 2007 in [place], [place]...   Outpatient Encounter Prescriptions as of 11/01/2015  Medication Sig  . calcium citrate (CALCITRATE - DOSED IN MG ELEMENTAL CALCIUM) 950 MG tablet Take 200 mg of elemental calcium by mouth daily.  Marland Kitchen diltiazem (CARDIZEM LA) 120 MG 24 hr tablet Take 240 mg by mouth daily.    Marland Kitchen esomeprazole (NEXIUM) 20 MG capsule Take 20 mg by mouth daily at 12 noon.  . finasteride (PROSCAR) 5 MG tablet Take 10 mg by mouth daily.  . Multiple Vitamins-Minerals (MULTIVITAMIN WITH MINERALS) tablet Take 1 tablet by mouth daily.  . polyethylene glycol (MIRALAX / GLYCOLAX) packet Take 17 g by mouth at bedtime.  . rosuvastatin (CRESTOR) 10 MG tablet Take 10 mg by mouth daily.  . sildenafil (REVATIO) 20 MG tablet Take 20 mg by mouth 2 (two) times daily.   . temazepam (RESTORIL) 30 MG capsule Take 30 mg by mouth at bedtime.  . vitamin E 1000 UNIT capsule Take 1,000 Units by mouth daily.  Marland Kitchen ADVAIR DISKUS 250-50 MCG/DOSE AEPB Inhale 1 puff into the lungs 2 (two) times daily.  Marland Kitchen SPIRIVA HANDIHALER 18 MCG inhalation capsule Place 1 capsule (18 mcg total) into inhaler and inhale daily.    No Known Allergies   Family History  Problem Relation Age of Onset  . Bone cancer Maternal Uncle   Father died at 70 from emphysema, he was a smoker... Mother died at 64 from pancreatic cancer... He has 2 half-brothers and 3 half-sisters; no hx lung diseases, one Bro w/ prostate Ca...   Current Medications, Allergies, Past Medical History, Past Surgical History, Family History, and Social History were reviewed in [facility] record.   Review of Systems             All symptoms NEG except where BOLDED >>  Constitutional:  F/C/S, fatigue, anorexia, unexpected weight change. HEENT:  HA, visual changes, hearing loss, earache, nasal symptoms, sore throat, mouth sores, hoarseness. Resp:  cough, sputum, hemoptysis; SOB, tightness, wheezing. Cardio:  CP, palpit, DOE, orthopnea, edema. GI:  N/V/D/C, blood in stool; reflux, abd pain, distention, gas. GU:  dysuria, freq, urgency, hematuria, flank pain, voiding difficulty. MS:  joint pain, swelling, tenderness, decr ROM; neck pain, back pain, etc. Neuro:  HA, tremors, seizures, dizziness, syncope, weakness, numbness, gait abn. Skin:   suspicious lesions or skin rash. Heme:  adenopathy, bruising, bleeding. Psyche:  confusion, agitation, sleep disturbance, hallucinations, anxiety, depression suicidal.   Objective:   Physical Exam       Vital Signs:  Reviewed...  General:  WD, WN, 74 y/o WM in NAD; alert & oriented; pleasant & cooperative... HEENT:  Andrew [name]/AT; Conjunctiva- pink, Sclera- nonicteric, EOM-wnl, PERRLA, EACs-clear, TMs-wnl; NOSE-clear; THROAT-clear & wnl. Neck:  Supple w/ fair ROM; no JVD; normal carotid impulses w/o bruits; no thyromegaly +small nodule palpated; no lymphadenopathy. Chest:  Overinflated, resonant percussion note, decr BS bilat & can't augment BS voluntarily, no w/r/r heard... Heart:  Regular Rhythm; norm S1 & S2 without murmurs, rubs, or gallops detected. Abdomen:  Soft & nontender- no guarding or rebound; normal bowel sounds; no organomegaly or masses palpated. Ext:  decr ROM; without deformities or arthritic changes; no varicose veins, +venous insuffic, no edema;  Pulses intact w/o bruits. Neuro:  No focal neuro deficits; sensory testing normal; gait normal & balance OK. Derm:  No lesions noted; no rash etc. Lymph:  No cervical, supraclavicular, axillary, or inguinal adenopathy palpated.   Assessment:      IMP >>     1) COPD/ bullous emphysema> s/p RUL "bleb-ectomy" in 2007, severe airflow obstruction w/ GOLD Stage3 COPD> on Advair250Bid & Spiriva daily; apparently he has no use for a rescue inhaler; prev on NEB w/ Albut but not for several yrs.     2) Chronic hypoxemic respiratory failure on O2 at 2L/min via pulse-dose concentrator...    3) Pt reports using BiPAP since 2007, never been on CPAP, never had sleep study he says, unknown ABGs or pCO2 data; machine from [place] [facility] we will try to get the old data...    4) Hx pulmonary hypertension on Revatio20Tid since 2007 w/o additional med trials or dose adjustments> we do not have any of the objective data from his prev physician teams.Marland KitchenMarland Kitchen    5)  Medical issues include:  HBP, ?nonobstructive CAD, HL, thyroid nodule, GERD, BPH, insomnia...  PLAN >>  11/14>   [place] has a distinctly patchy history to go along w/ his severe airflow obstruction & bullous emphysema;  We really need his old objective data from 2007 when he was started on O2 & BiPAP after his RUL bleb reduction surg;  He will try to get names and numbers for [place]- in the meanwhile we will contact his last physician in [facility] for their more recent data as we establish out data base here in [place];  He is very concerned that he wants [place] to continue his current regimen which has served him well over the last 31yrs;  Continue Advair250Bid, Spiriva daily, Revatio20tid, O2 at 2L/min, and the BiPAP nightly as currently set...  12/14>  See prob list above- he is stable on this regimen but does not want to wean the Revatio further for "other" reasons; OK to continue current med regimen- advised regular exercise vs pulm rehab program; given ZPak for prn use over the winter & he knows to call for any resp issues, incr dyspnea, etc... He remains on BiPAP but we do not have any data- no records received from prev pulm physicians, no notes from [place], no download data from his machine- we will again try to make contact w/ his DME company...     Plan:     Patient's Medications  New Prescriptions   AZITHROMYCIN (ZITHROMAX Z-PAK) 250 MG TABLET    Use as directed  Previous Medications   CALCIUM CITRATE (CALCITRATE - DOSED IN MG ELEMENTAL CALCIUM) 950 MG TABLET    Take 200 mg of elemental calcium by mouth daily.   DILTIAZEM (CARDIZEM LA) 120 MG 24 HR TABLET    Take 240 mg  by mouth daily.   ESOMEPRAZOLE (NEXIUM) 20 MG CAPSULE    Take 20 mg by mouth daily at 12 noon.   FINASTERIDE (PROSCAR) 5 MG TABLET    Take 10 mg by mouth daily.   MULTIPLE VITAMINS-MINERALS (MULTIVITAMIN WITH MINERALS) TABLET    Take 1 tablet by mouth daily.   POLYETHYLENE GLYCOL (MIRALAX / GLYCOLAX) PACKET    Take 17 g by  mouth at bedtime.   ROSUVASTATIN (CRESTOR) 10 MG TABLET    Take 10 mg by mouth daily.   SILDENAFIL (REVATIO) 20 MG TABLET    Take 20 mg by mouth 2 (two) times daily.    TEMAZEPAM (RESTORIL) 30 MG CAPSULE    Take 30 mg by mouth at bedtime.   VITAMIN E 1000 UNIT CAPSULE    Take 1,000 Units by mouth daily.  Modified Medications   Modified Medication Previous Medication   ADVAIR DISKUS 250-50 MCG/DOSE AEPB ADVAIR DISKUS 250-50 MCG/DOSE AEPB      Inhale 1 puff into the lungs 2 (two) times daily.    1 puff 2 (two) times daily.   SPIRIVA HANDIHALER 18 MCG INHALATION CAPSULE SPIRIVA HANDIHALER 18 MCG inhalation capsule      Place 1 capsule (18 mcg total) into inhaler and inhale daily.    Place 1 capsule into inhaler and inhale daily.  Discontinued Medications   No medications on file

## 2015-11-28 ENCOUNTER — Telehealth: Payer: Self-pay | Admitting: Pulmonary Disease

## 2015-11-28 DIAGNOSIS — G4733 Obstructive sleep apnea (adult) (pediatric): Secondary | ICD-10-CM

## 2015-11-28 NOTE — Telephone Encounter (Signed)
Called # provided and went straight to VM--LMTCB x1

## 2015-11-29 ENCOUNTER — Telehealth: Payer: Self-pay | Admitting: Pulmonary Disease

## 2015-11-29 DIAGNOSIS — G4733 Obstructive sleep apnea (adult) (pediatric): Secondary | ICD-10-CM

## 2015-11-29 NOTE — Telephone Encounter (Signed)
Spoke with pt. States that he needs an order sent to [facility] for BiPAP supplies. Order will be sent in. Nothing further was needed.

## 2015-11-29 NOTE — Telephone Encounter (Signed)
Gilda from [facility] returned call.  Needing the patient's sleep study fax [phone].  CB is [phone].

## 2015-11-29 NOTE — Telephone Encounter (Signed)
Per SN >> pt will need to repeat sleep study.  Pt is aware that he will need to repeat his sleep study. Order has been placed for a split night study. lmtcb for [facility] to make them aware of this.

## 2015-11-29 NOTE — Telephone Encounter (Signed)
There isn't a sleep study in the system. lmtcb x1 for pt.

## 2015-11-29 NOTE — Telephone Encounter (Signed)
Spoke with [facility]'s answering service. They will call us back when they return from lunch.

## 2015-11-29 NOTE — Telephone Encounter (Signed)
Pt returning call and can be reached @ same.Andrew [name] ° °

## 2015-11-29 NOTE — Telephone Encounter (Signed)
Pt states that he does not have a copy of this study - states it was done about 10+ years ago.  Will send to Dr Lenna Gilford to advise. Thanks.

## 2015-11-30 NOTE — Telephone Encounter (Signed)
Spoke with Bethanne Ginger at [facility] to make aware.  Nothing further needed at this time.

## 2015-12-08 DIAGNOSIS — J439 Emphysema, unspecified: Secondary | ICD-10-CM | POA: Diagnosis not present

## 2015-12-08 DIAGNOSIS — J019 Acute sinusitis, unspecified: Secondary | ICD-10-CM | POA: Diagnosis not present

## 2015-12-08 DIAGNOSIS — D229 Melanocytic nevi, unspecified: Secondary | ICD-10-CM | POA: Diagnosis not present

## 2015-12-18 DIAGNOSIS — I10 Essential (primary) hypertension: Secondary | ICD-10-CM | POA: Diagnosis not present

## 2015-12-18 DIAGNOSIS — E785 Hyperlipidemia, unspecified: Secondary | ICD-10-CM | POA: Diagnosis not present

## 2015-12-18 DIAGNOSIS — I272 Other secondary pulmonary hypertension: Secondary | ICD-10-CM | POA: Diagnosis not present

## 2015-12-18 DIAGNOSIS — Z136 Encounter for screening for cardiovascular disorders: Secondary | ICD-10-CM | POA: Diagnosis not present

## 2015-12-18 DIAGNOSIS — Z79899 Other long term (current) drug therapy: Secondary | ICD-10-CM | POA: Diagnosis not present

## 2015-12-18 DIAGNOSIS — G4700 Insomnia, unspecified: Secondary | ICD-10-CM | POA: Diagnosis not present

## 2015-12-18 DIAGNOSIS — N400 Enlarged prostate without lower urinary tract symptoms: Secondary | ICD-10-CM | POA: Diagnosis not present

## 2015-12-18 DIAGNOSIS — Z0001 Encounter for general adult medical examination with abnormal findings: Secondary | ICD-10-CM | POA: Diagnosis not present

## 2015-12-18 DIAGNOSIS — E041 Nontoxic single thyroid nodule: Secondary | ICD-10-CM | POA: Diagnosis not present

## 2015-12-18 DIAGNOSIS — J439 Emphysema, unspecified: Secondary | ICD-10-CM | POA: Diagnosis not present

## 2015-12-18 DIAGNOSIS — K219 Gastro-esophageal reflux disease without esophagitis: Secondary | ICD-10-CM | POA: Diagnosis not present

## 2015-12-20 ENCOUNTER — Other Ambulatory Visit: Payer: Self-pay | Admitting: Family Medicine

## 2015-12-20 DIAGNOSIS — Z136 Encounter for screening for cardiovascular disorders: Secondary | ICD-10-CM

## 2015-12-20 DIAGNOSIS — E041 Nontoxic single thyroid nodule: Secondary | ICD-10-CM

## 2015-12-22 ENCOUNTER — Ambulatory Visit
Admission: RE | Admit: 2015-12-22 | Discharge: 2015-12-22 | Disposition: A | Discharge status: Home / Self Care | Payer: Medicare Other | Source: Ambulatory Visit | Attending: Family Medicine | Admitting: Family Medicine

## 2015-12-22 DIAGNOSIS — Z136 Encounter for screening for cardiovascular disorders: Secondary | ICD-10-CM | POA: Diagnosis not present

## 2015-12-22 DIAGNOSIS — E042 Nontoxic multinodular goiter: Secondary | ICD-10-CM | POA: Diagnosis not present

## 2015-12-22 DIAGNOSIS — E041 Nontoxic single thyroid nodule: Secondary | ICD-10-CM

## 2015-12-26 DIAGNOSIS — N401 Enlarged prostate with lower urinary tract symptoms: Secondary | ICD-10-CM | POA: Diagnosis not present

## 2015-12-26 DIAGNOSIS — R3912 Poor urinary stream: Secondary | ICD-10-CM | POA: Diagnosis not present

## 2015-12-26 DIAGNOSIS — R3121 Asymptomatic microscopic hematuria: Secondary | ICD-10-CM | POA: Diagnosis not present

## 2015-12-26 DIAGNOSIS — Z0000 Encounter for general adult medical examination without abnormal findings: Secondary | ICD-10-CM | POA: Diagnosis not present

## 2015-12-28 ENCOUNTER — Inpatient Hospital Stay (HOSPITAL_COMMUNITY)
Admission: EM | Admit: 2015-12-28 | Discharge: 2016-01-03 | DRG: 871 | Disposition: A | Discharge status: Home Health | Payer: Medicare Other | Attending: Family Medicine | Admitting: Family Medicine

## 2015-12-28 ENCOUNTER — Encounter (HOSPITAL_COMMUNITY): Payer: Self-pay | Admitting: Emergency Medicine

## 2015-12-28 ENCOUNTER — Emergency Department (HOSPITAL_COMMUNITY): Payer: Medicare Other

## 2015-12-28 DIAGNOSIS — Z79899 Other long term (current) drug therapy: Secondary | ICD-10-CM | POA: Diagnosis not present

## 2015-12-28 DIAGNOSIS — R0989 Other specified symptoms and signs involving the circulatory and respiratory systems: Secondary | ICD-10-CM

## 2015-12-28 DIAGNOSIS — A419 Sepsis, unspecified organism: Secondary | ICD-10-CM | POA: Diagnosis not present

## 2015-12-28 DIAGNOSIS — I2510 Atherosclerotic heart disease of native coronary artery without angina pectoris: Secondary | ICD-10-CM | POA: Diagnosis present

## 2015-12-28 DIAGNOSIS — Z66 Do not resuscitate: Secondary | ICD-10-CM | POA: Diagnosis present

## 2015-12-28 DIAGNOSIS — R109 Unspecified abdominal pain: Secondary | ICD-10-CM | POA: Diagnosis not present

## 2015-12-28 DIAGNOSIS — I10 Essential (primary) hypertension: Secondary | ICD-10-CM | POA: Diagnosis present

## 2015-12-28 DIAGNOSIS — Z87891 Personal history of nicotine dependence: Secondary | ICD-10-CM | POA: Diagnosis not present

## 2015-12-28 DIAGNOSIS — Z8679 Personal history of other diseases of the circulatory system: Secondary | ICD-10-CM | POA: Diagnosis not present

## 2015-12-28 DIAGNOSIS — R0600 Dyspnea, unspecified: Secondary | ICD-10-CM | POA: Diagnosis not present

## 2015-12-28 DIAGNOSIS — R338 Other retention of urine: Secondary | ICD-10-CM | POA: Diagnosis present

## 2015-12-28 DIAGNOSIS — R069 Unspecified abnormalities of breathing: Secondary | ICD-10-CM

## 2015-12-28 DIAGNOSIS — E872 Acidosis: Secondary | ICD-10-CM | POA: Diagnosis present

## 2015-12-28 DIAGNOSIS — R000 Tachycardia, unspecified: Secondary | ICD-10-CM | POA: Diagnosis not present

## 2015-12-28 DIAGNOSIS — J438 Other emphysema: Secondary | ICD-10-CM | POA: Diagnosis not present

## 2015-12-28 DIAGNOSIS — J441 Chronic obstructive pulmonary disease with (acute) exacerbation: Secondary | ICD-10-CM | POA: Diagnosis present

## 2015-12-28 DIAGNOSIS — R739 Hyperglycemia, unspecified: Secondary | ICD-10-CM | POA: Diagnosis not present

## 2015-12-28 DIAGNOSIS — N401 Enlarged prostate with lower urinary tract symptoms: Secondary | ICD-10-CM | POA: Diagnosis present

## 2015-12-28 DIAGNOSIS — R509 Fever, unspecified: Secondary | ICD-10-CM | POA: Diagnosis not present

## 2015-12-28 DIAGNOSIS — T380X5A Adverse effect of glucocorticoids and synthetic analogues, initial encounter: Secondary | ICD-10-CM | POA: Diagnosis not present

## 2015-12-28 DIAGNOSIS — J440 Chronic obstructive pulmonary disease with acute lower respiratory infection: Secondary | ICD-10-CM | POA: Diagnosis present

## 2015-12-28 DIAGNOSIS — J439 Emphysema, unspecified: Secondary | ICD-10-CM | POA: Diagnosis present

## 2015-12-28 DIAGNOSIS — R0603 Acute respiratory distress: Secondary | ICD-10-CM

## 2015-12-28 DIAGNOSIS — Z9981 Dependence on supplemental oxygen: Secondary | ICD-10-CM | POA: Diagnosis not present

## 2015-12-28 DIAGNOSIS — I272 Other secondary pulmonary hypertension: Secondary | ICD-10-CM | POA: Diagnosis present

## 2015-12-28 DIAGNOSIS — R05 Cough: Secondary | ICD-10-CM | POA: Diagnosis not present

## 2015-12-28 DIAGNOSIS — J9621 Acute and chronic respiratory failure with hypoxia: Secondary | ICD-10-CM | POA: Diagnosis not present

## 2015-12-28 DIAGNOSIS — J189 Pneumonia, unspecified organism: Secondary | ICD-10-CM | POA: Diagnosis not present

## 2015-12-28 DIAGNOSIS — N400 Enlarged prostate without lower urinary tract symptoms: Secondary | ICD-10-CM | POA: Diagnosis present

## 2015-12-28 HISTORY — DX: Essential (primary) hypertension: I10

## 2015-12-28 HISTORY — DX: Pulmonary hypertension, unspecified: I27.20

## 2015-12-28 LAB — URINE MICROSCOPIC-ADD ON

## 2015-12-28 LAB — CBC WITH DIFFERENTIAL/PLATELET
Basophils Absolute: 0 K/uL (ref 0.0–0.1)
Basophils Relative: 0 %
Eosinophils Absolute: 0 K/uL (ref 0.0–0.7)
Eosinophils Relative: 0 %
HCT: 35.8 % — ABNORMAL LOW (ref 39.0–52.0)
Hemoglobin: 11.9 g/dL — ABNORMAL LOW (ref 13.0–17.0)
Lymphocytes Relative: 4 %
Lymphs Abs: 0.9 K/uL (ref 0.7–4.0)
MCH: 31.8 pg (ref 26.0–34.0)
MCHC: 33.2 g/dL (ref 30.0–36.0)
MCV: 95.7 fL (ref 78.0–100.0)
Monocytes Absolute: 1 K/uL (ref 0.1–1.0)
Monocytes Relative: 5 %
Neutro Abs: 19.2 K/uL — ABNORMAL HIGH (ref 1.7–7.7)
Neutrophils Relative %: 91 %
Platelets: 188 K/uL (ref 150–400)
RBC: 3.74 MIL/uL — ABNORMAL LOW (ref 4.22–5.81)
RDW: 12.9 % (ref 11.5–15.5)
WBC: 21.1 K/uL — ABNORMAL HIGH (ref 4.0–10.5)

## 2015-12-28 LAB — COMPREHENSIVE METABOLIC PANEL
ALBUMIN: 3.9 g/dL (ref 3.5–5.0)
ALK PHOS: 69 U/L (ref 38–126)
ALT: 25 U/L (ref 17–63)
ALT: 22 U/L (ref 17–63)
ANION GAP: 15 (ref 5–15)
ANION GAP: 9 (ref 5–15)
AST: 20 U/L (ref 15–41)
AST: 22 U/L (ref 15–41)
Albumin: 3 g/dL — ABNORMAL LOW (ref 3.5–5.0)
Alkaline Phosphatase: 56 U/L (ref 38–126)
BILIRUBIN TOTAL: 0.8 mg/dL (ref 0.3–1.2)
BILIRUBIN TOTAL: 0.5 mg/dL (ref 0.3–1.2)
BUN: 16 mg/dL (ref 6–20)
BUN: 10 mg/dL (ref 6–20)
CALCIUM: 9.2 mg/dL (ref 8.9–10.3)
CO2: 23 mmol/L (ref 22–32)
CO2: 23 mmol/L (ref 22–32)
Calcium: 8.4 mg/dL — ABNORMAL LOW (ref 8.9–10.3)
Chloride: 100 mmol/L — ABNORMAL LOW (ref 101–111)
Chloride: 109 mmol/L (ref 101–111)
Creatinine, Ser: 1.05 mg/dL (ref 0.61–1.24)
Creatinine, Ser: 0.95 mg/dL (ref 0.61–1.24)
GFR calc Af Amer: >60 mL/min (ref >60)
GLUCOSE: 177 mg/dL — AB (ref 65–99)
Glucose, Bld: 150 mg/dL — ABNORMAL HIGH (ref 65–99)
POTASSIUM: 4 mmol/L (ref 3.5–5.1)
Potassium: 3.8 mmol/L (ref 3.5–5.1)
Sodium: 138 mmol/L (ref 135–145)
Sodium: 141 mmol/L (ref 135–145)
TOTAL PROTEIN: 6.8 g/dL (ref 6.5–8.1)
TOTAL PROTEIN: 5.8 g/dL — AB (ref 6.5–8.1)

## 2015-12-28 LAB — MAGNESIUM: Magnesium: 1.8 mg/dL (ref 1.7–2.4)

## 2015-12-28 LAB — URINALYSIS, ROUTINE W REFLEX MICROSCOPIC
Bilirubin Urine: NEGATIVE
Bilirubin Urine: NEGATIVE
GLUCOSE, UA: 100 mg/dL — AB
Glucose, UA: 100 mg/dL — AB
Ketones, ur: >80 mg/dL — AB
Ketones, ur: NEGATIVE mg/dL
LEUKOCYTES UA: NEGATIVE
LEUKOCYTES UA: NEGATIVE
NITRITE: NEGATIVE
Nitrite: NEGATIVE
PH: 5.5 (ref 5.0–8.0)
PH: 6.5 (ref 5.0–8.0)
Protein, ur: NEGATIVE mg/dL
Protein, ur: NEGATIVE mg/dL
SPECIFIC GRAVITY, URINE: 1.009 (ref 1.005–1.030)
Specific Gravity, Urine: 1.019 (ref 1.005–1.030)

## 2015-12-28 LAB — CBC
HCT: 41.1 % (ref 39.0–52.0)
HEMOGLOBIN: 13.9 g/dL (ref 13.0–17.0)
MCH: 32.4 pg (ref 26.0–34.0)
MCHC: 33.8 g/dL (ref 30.0–36.0)
MCV: 95.8 fL (ref 78.0–100.0)
PLATELETS: 226 10*3/uL (ref 150–400)
RBC: 4.29 MIL/uL (ref 4.22–5.81)
RDW: 12.8 % (ref 11.5–15.5)
WBC: 16.8 10*3/uL — ABNORMAL HIGH (ref 4.0–10.5)

## 2015-12-28 LAB — INFLUENZA PANEL BY PCR (TYPE A & B)
H1N1 flu by pcr: NOT DETECTED
Influenza A By PCR: NEGATIVE
Influenza B By PCR: NEGATIVE

## 2015-12-28 LAB — I-STAT TROPONIN, ED: Troponin i, poc: 0.01 ng/mL (ref 0.00–0.08)

## 2015-12-28 LAB — DIFFERENTIAL
Basophils Absolute: 0 10*3/uL (ref 0.0–0.1)
Basophils Relative: 0 %
EOS PCT: 0 %
Eosinophils Absolute: 0 10*3/uL (ref 0.0–0.7)
LYMPHS ABS: 0.8 10*3/uL (ref 0.7–4.0)
LYMPHS PCT: 5 %
MONO ABS: 0.3 10*3/uL (ref 0.1–1.0)
Monocytes Relative: 2 %
NEUTROS PCT: 93 %
Neutro Abs: 15.2 10*3/uL — ABNORMAL HIGH (ref 1.7–7.7)

## 2015-12-28 LAB — I-STAT CG4 LACTIC ACID, ED: LACTIC ACID, VENOUS: 2.53 mmol/L — AB (ref 0.5–2.0)

## 2015-12-28 LAB — LACTIC ACID, PLASMA
LACTIC ACID, VENOUS: 1.2 mmol/L (ref 0.5–2.0)
LACTIC ACID, VENOUS: 1.1 mmol/L (ref 0.5–2.0)
Lactic Acid, Venous: 2.4 mmol/L (ref 0.5–2.0)

## 2015-12-28 LAB — STREP PNEUMONIAE URINARY ANTIGEN: Strep Pneumo Urinary Antigen: NEGATIVE

## 2015-12-28 LAB — PROCALCITONIN: Procalcitonin: 0.36 ng/mL

## 2015-12-28 LAB — MRSA PCR SCREENING: MRSA by PCR: NEGATIVE

## 2015-12-28 MED ORDER — SODIUM CHLORIDE 0.9 % IV BOLUS (SEPSIS)
1000.0000 mL | Freq: Once | INTRAVENOUS | Status: AC
  Administered 2015-12-28: 1000 mL via INTRAVENOUS

## 2015-12-28 MED ORDER — MULTI-VITAMIN/MINERALS PO TABS
1.0000 | ORAL_TABLET | Freq: Every day | ORAL | Status: DC
Start: 2015-12-28 — End: 2015-12-28

## 2015-12-28 MED ORDER — ALBUTEROL SULFATE (2.5 MG/3ML) 0.083% IN NEBU
5.0000 mg | INHALATION_SOLUTION | Freq: Once | RESPIRATORY_TRACT | Status: AC
  Administered 2015-12-28: 5 mg via RESPIRATORY_TRACT
  Filled 2015-12-28: qty 6

## 2015-12-28 MED ORDER — SIMETHICONE 80 MG PO CHEW
160.0000 mg | CHEWABLE_TABLET | Freq: Four times a day (QID) | ORAL | Status: DC | PRN
  Administered 2015-12-30 – 2016-01-01 (×6): 160 mg via ORAL
  Filled 2015-12-28 (×6): qty 2

## 2015-12-28 MED ORDER — SILDENAFIL CITRATE 20 MG PO TABS
20.0000 mg | ORAL_TABLET | Freq: Two times a day (BID) | ORAL | Status: DC
  Administered 2015-12-28 – 2016-01-03 (×13): 20 mg via ORAL
  Filled 2015-12-28 (×16): qty 1

## 2015-12-28 MED ORDER — TEMAZEPAM 15 MG PO CAPS
30.0000 mg | ORAL_CAPSULE | Freq: Every day | ORAL | Status: DC
  Administered 2015-12-28 – 2016-01-02 (×6): 30 mg via ORAL
  Filled 2015-12-28 (×6): qty 2

## 2015-12-28 MED ORDER — SODIUM CHLORIDE 0.9% FLUSH
3.0000 mL | Freq: Two times a day (BID) | INTRAVENOUS | Status: DC
Start: 2015-12-28 — End: 2016-01-03
  Administered 2015-12-28 – 2016-01-03 (×10): 3 mL via INTRAVENOUS

## 2015-12-28 MED ORDER — CETYLPYRIDINIUM CHLORIDE 0.05 % MT LIQD
7.0000 mL | Freq: Two times a day (BID) | OROMUCOSAL | Status: DC
  Administered 2015-12-28 – 2016-01-03 (×11): 7 mL via OROMUCOSAL

## 2015-12-28 MED ORDER — VANCOMYCIN HCL 500 MG IV SOLR
500.0000 mg | Freq: Two times a day (BID) | INTRAVENOUS | Status: DC
  Administered 2015-12-28 – 2015-12-29 (×2): 500 mg via INTRAVENOUS
  Filled 2015-12-28 (×4): qty 500

## 2015-12-28 MED ORDER — METHYLPREDNISOLONE SODIUM SUCC 125 MG IJ SOLR
60.0000 mg | INTRAMUSCULAR | Status: DC
  Administered 2015-12-28 – 2016-01-02 (×6): 60 mg via INTRAVENOUS
  Filled 2015-12-28 (×6): qty 2

## 2015-12-28 MED ORDER — DM-GUAIFENESIN ER 30-600 MG PO TB12
1.0000 | ORAL_TABLET | Freq: Two times a day (BID) | ORAL | Status: DC
Start: 2015-12-28 — End: 2016-01-03
  Administered 2015-12-28 – 2016-01-03 (×13): 1 via ORAL
  Filled 2015-12-28 (×14): qty 1

## 2015-12-28 MED ORDER — FINASTERIDE 5 MG PO TABS
5.0000 mg | ORAL_TABLET | Freq: Every day | ORAL | Status: DC
  Administered 2015-12-28 – 2016-01-03 (×7): 5 mg via ORAL
  Filled 2015-12-28 (×7): qty 1

## 2015-12-28 MED ORDER — PIPERACILLIN-TAZOBACTAM 3.375 G IVPB 30 MIN
3.3750 g | Freq: Once | INTRAVENOUS | Status: AC
  Administered 2015-12-28: 3.375 g via INTRAVENOUS
  Filled 2015-12-28: qty 50

## 2015-12-28 MED ORDER — SODIUM CHLORIDE 0.9 % IV SOLN
INTRAVENOUS | Status: DC
  Administered 2015-12-28 – 2015-12-30 (×6): via INTRAVENOUS
  Administered 2015-12-30: 100 mL/h via INTRAVENOUS

## 2015-12-28 MED ORDER — ADULT MULTIVITAMIN W/MINERALS CH
1.0000 | ORAL_TABLET | Freq: Every day | ORAL | Status: DC
Start: 2015-12-28 — End: 2016-01-03
  Administered 2015-12-28 – 2016-01-03 (×7): 1 via ORAL
  Filled 2015-12-28 (×7): qty 1

## 2015-12-28 MED ORDER — ENOXAPARIN SODIUM 40 MG/0.4ML ~~LOC~~ SOLN
40.0000 mg | SUBCUTANEOUS | Status: DC
  Administered 2015-12-28 – 2016-01-02 (×6): 40 mg via SUBCUTANEOUS
  Filled 2015-12-28 (×7): qty 0.4

## 2015-12-28 MED ORDER — ACETAMINOPHEN 500 MG PO TABS
1000.0000 mg | ORAL_TABLET | Freq: Once | ORAL | Status: AC
  Administered 2015-12-28: 1000 mg via ORAL
  Filled 2015-12-28: qty 2

## 2015-12-28 MED ORDER — ROSUVASTATIN CALCIUM 10 MG PO TABS
10.0000 mg | ORAL_TABLET | Freq: Every day | ORAL | Status: DC
  Administered 2015-12-28 – 2016-01-02 (×6): 10 mg via ORAL
  Filled 2015-12-28 (×7): qty 1

## 2015-12-28 MED ORDER — TIOTROPIUM BROMIDE MONOHYDRATE 18 MCG IN CAPS
1.0000 | ORAL_CAPSULE | Freq: Every day | RESPIRATORY_TRACT | Status: DC
  Administered 2015-12-28: 18 ug via RESPIRATORY_TRACT
  Filled 2015-12-28: qty 5

## 2015-12-28 MED ORDER — PIPERACILLIN-TAZOBACTAM 3.375 G IVPB
3.3750 g | Freq: Three times a day (TID) | INTRAVENOUS | Status: DC
  Administered 2015-12-28 – 2015-12-31 (×10): 3.375 g via INTRAVENOUS
  Filled 2015-12-28 (×11): qty 50

## 2015-12-28 MED ORDER — SODIUM CHLORIDE 0.9 % IV BOLUS (SEPSIS): 1000.0000 mL | Freq: Once | INTRAVENOUS | Status: DC

## 2015-12-28 MED ORDER — MOMETASONE FURO-FORMOTEROL FUM 100-5 MCG/ACT IN AERO
2.0000 | INHALATION_SPRAY | Freq: Two times a day (BID) | RESPIRATORY_TRACT | Status: DC
  Administered 2015-12-28 – 2016-01-02 (×11): 2 via RESPIRATORY_TRACT
  Filled 2015-12-28 (×3): qty 8.8

## 2015-12-28 MED ORDER — PANTOPRAZOLE SODIUM 40 MG PO TBEC
40.0000 mg | DELAYED_RELEASE_TABLET | Freq: Every day | ORAL | Status: DC
  Administered 2015-12-28 – 2016-01-03 (×7): 40 mg via ORAL
  Filled 2015-12-28 (×7): qty 1

## 2015-12-28 MED ORDER — DILTIAZEM HCL ER COATED BEADS 240 MG PO CP24
240.0000 mg | ORAL_CAPSULE | Freq: Every day | ORAL | Status: DC
  Administered 2015-12-28 – 2016-01-03 (×7): 240 mg via ORAL
  Filled 2015-12-28 (×6): qty 2
  Filled 2015-12-28: qty 1
  Filled 2015-12-28 (×7): qty 2

## 2015-12-28 MED ORDER — POLYETHYLENE GLYCOL 3350 17 G PO PACK
17.0000 g | PACK | Freq: Every day | ORAL | Status: DC
  Administered 2015-12-28 – 2016-01-02 (×6): 17 g via ORAL
  Filled 2015-12-28 (×6): qty 1

## 2015-12-28 MED ORDER — SODIUM CHLORIDE 0.9 % IV SOLN
INTRAVENOUS | Status: DC
  Administered 2015-12-28: 250 mL via INTRAVENOUS

## 2015-12-28 MED ORDER — VANCOMYCIN HCL 10 G IV SOLR
1500.0000 mg | Freq: Once | INTRAVENOUS | Status: AC
  Administered 2015-12-28: 1500 mg via INTRAVENOUS
  Filled 2015-12-28: qty 1500

## 2015-12-28 MED ORDER — IPRATROPIUM-ALBUTEROL 0.5-2.5 (3) MG/3ML IN SOLN
3.0000 mL | Freq: Four times a day (QID) | RESPIRATORY_TRACT | Status: DC
  Administered 2015-12-28 – 2016-01-01 (×16): 3 mL via RESPIRATORY_TRACT
  Filled 2015-12-28 (×17): qty 3

## 2015-12-28 MED ORDER — ACETAMINOPHEN 325 MG PO TABS: 650.0000 mg | ORAL_TABLET | Freq: Four times a day (QID) | ORAL | Status: DC | PRN

## 2015-12-28 NOTE — Progress Notes (Signed)
Dr. Sherral Hammers notified of pt having difficulty voiding, and that in past admissions and has had to have foley inserted.  Orders received.  Will continue to monitor pt closely.

## 2015-12-28 NOTE — H&P (Signed)
[facility] History and Physical  [name] [id number] DOB: Apr 16, 1941 DOA: 12/28/2015  Referring physician: EDP PCP: Lujean Amel, MD   Chief Complaint: Respiratory distress   HPI: Andrew [name] is a 75 y.o. male with h/o COPD, PAH.  Patient is on 2L o2 during day and 3L at night time at baseline.  Patient presents to ED with respiratory distress, chills, nausea, vomiting, non-productive cough.  Symptoms of cough have persisted since a sinus infection 2 weeks ago which resolved.  Symptoms markedly worsened today and are associated with fevers, chills and dyspnea.  On arrival to ED patient has increased O2 requirement from baseline.  Fever 103.2, tachycardia to 140s, WBC 16.5k.  Work up including UA and CXR is negative for source though PNA is still suspected given the increased O2 requirement.  Review of Systems: Systems reviewed.  As above, otherwise negative  Past Medical History  Diagnosis Date  . Emphysema lung ([place])   . Hypertension   . Pulmonary hypertension [facility])    Past Surgical History  Procedure Laterality Date  . Tonsillectomy    . Lung surgery     Social History:  reports that he quit smoking about 31 years ago. His smoking use included Cigarettes. He smoked 0.00 packs per day for 0 years. He does not have any smokeless tobacco history on file. He reports that he does not drink alcohol or use illicit drugs.  No Known Allergies  Family History  Problem Relation Age of Onset  . Bone cancer Maternal Uncle      Prior to Admission medications   Medication Sig Start Date End Date Taking? Authorizing Provider  ADVAIR DISKUS 250-50 MCG/DOSE AEPB Inhale 1 puff into the lungs 2 (two) times daily. 11/01/15  Yes Noralee Space, MD  CALCIUM PO Take 1 tablet by mouth daily.   Yes Historical Provider, MD  diltiazem (CARDIZEM LA) 120 MG 24 hr tablet Take 240 mg by mouth daily.   Yes Historical Provider, MD  esomeprazole (NEXIUM) 20 MG capsule Take 20 mg by mouth  daily at 12 noon.   Yes Historical Provider, MD  finasteride (PROSCAR) 5 MG tablet Take 5 mg by mouth daily.    Yes Historical Provider, MD  Multiple Vitamins-Minerals (MULTIVITAMIN WITH MINERALS) tablet Take 1 tablet by mouth daily.   Yes Historical Provider, MD  polyethylene glycol (MIRALAX / GLYCOLAX) packet Take 17 g by mouth at bedtime.   Yes Historical Provider, MD  rosuvastatin (CRESTOR) 10 MG tablet Take 10 mg by mouth daily.   Yes Historical Provider, MD  sildenafil (REVATIO) 20 MG tablet Take 20 mg by mouth 2 (two) times daily.    Yes Historical Provider, MD  SPIRIVA HANDIHALER 18 MCG inhalation capsule Place 1 capsule (18 mcg total) into inhaler and inhale daily. 11/01/15  Yes Noralee Space, MD  temazepam (RESTORIL) 30 MG capsule Take 30 mg by mouth at bedtime.   Yes Historical Provider, MD  vitamin E 1000 UNIT capsule Take 1,000 Units by mouth daily.   Yes Historical Provider, MD  azithromycin (ZITHROMAX Z-PAK) 250 MG tablet Use as directed Patient not taking: Reported on 12/28/2015 11/01/15   Noralee Space, MD   Physical Exam: Filed Vitals:   12/28/15 0245 12/28/15 0300  BP: 142/68 146/67  Pulse: 116 116  Temp:    Resp: 20 19    BP 146/67 mmHg  Pulse 116  Temp(Src) 103.2 F (39.6 C) (Rectal)  Resp 19  SpO2 94%  General Appearance:  Alert, oriented, no distress, appears stated age  Head:    Normocephalic, atraumatic  Eyes:    PERRL, EOMI, sclera non-icteric        Nose:   Nares without drainage or epistaxis. Mucosa, turbinates normal  Throat:   Moist mucous membranes. Oropharynx without erythema or exudate.  Neck:   Supple. No carotid bruits.  No thyromegaly.  No lymphadenopathy.   Back:     No CVA tenderness, no spinal tenderness  Lungs:     Clear to auscultation bilaterally, without wheezes, rhonchi or rales  Chest wall:    No tenderness to palpitation  Heart:    Regular rate and rhythm without murmurs, gallops, rubs  Abdomen:     Soft, non-tender, nondistended,  normal bowel sounds, no organomegaly  Genitalia:    deferred  Rectal:    deferred  Extremities:   No clubbing, cyanosis or edema.  Pulses:   2+ and symmetric all extremities  Skin:   Skin color, texture, turgor normal, no rashes or lesions  Lymph nodes:   Cervical, supraclavicular, and axillary nodes normal  Neurologic:   CNII-XII intact. Normal strength, sensation and reflexes      throughout    Labs on Admission:  Basic Metabolic Panel:  Recent Labs Lab 12/28/15 0130  NA 138  K 3.8  CL 100*  CO2 23  GLUCOSE 177*  BUN 16  CREATININE 1.05  CALCIUM 9.2   Liver Function Tests:  Recent Labs Lab 12/28/15 0130  AST 20  ALT 25  ALKPHOS 69  BILITOT 0.8  PROT 6.8  ALBUMIN 3.9   No results for input(s): LIPASE, AMYLASE in the last 168 hours. No results for input(s): AMMONIA in the last 168 hours. CBC:  Recent Labs Lab 12/28/15 0130  WBC 16.8*  NEUTROABS 15.2*  HGB 13.9  HCT 41.1  MCV 95.8  PLT 226   Cardiac Enzymes: No results for input(s): CKTOTAL, CKMB, CKMBINDEX, TROPONINI in the last 168 hours.  BNP (last 3 results) No results for input(s): PROBNP in the last 8760 hours. CBG: No results for input(s): GLUCAP in the last 168 hours.  Radiological Exams on Admission: Dg Chest Port 1 View  12/28/2015  CLINICAL DATA:  75 year old male with fever and cough EXAM: PORTABLE CHEST 1 VIEW COMPARISON:  Chest radiograph dated 10/02/2015 FINDINGS: There is severe falls emphysematous changes of the lungs. There are bibasilar atelectatic changes/ scarring. There is a large bulla in the right upper lobe. No focal consolidation, pleural effusion, or pneumothorax. The cardiac silhouette is within normal limits. No acute osseous pathology. IMPRESSION: Emphysema.  No focal consolidation or pneumothorax. Electronically Signed   By: Anner Crete M.D.   On: 12/28/2015 02:01    EKG: Independently reviewed.  Assessment/Plan Principal Problem:   Sepsis ([place]) Active Problems:    COPD with emphysema ([place])   History of pulmonary hypertension   Acute on chronic respiratory failure with hypoxia ([place])   1. Sepsis - 1. Work up negative for source, but PNA is still suspicious given the increased O2 requirement. 2. Sepsis pathway 3. Cultures pending 4. Empiric zosyn and vanc for now 5. Tylenol PRN fever 6. procalcitonin pending 7. Serial lactates 8. IVF strategy noted below, getting 2nd L bolus in ED 2. COPD - continue home meds, no wheezing or obvious signs of exacerbation to indicate steroids at this piont 3. H/o pulm HTN - will do boluses for treatment of sepsis as opposed to a single large fluid bolus to try and keep  him from becoming fluid overloaded with this history.  Will continue PDE5 4. Acute on chronic respiratory failure with hypoxia - monitor O2 sat    Code Status: DNR - patient states he would like to live but "no heroics" please Family Communication: No family in room Disposition Plan: admit to inpatient   Time spent: 70 min  Caryl Manas M. [facility] Pager [phone]  If 7AM-7PM, please contact the day team taking care of the patient [email or web address] Password [email or web address] 12/28/2015, 3:17 AM

## 2015-12-28 NOTE — Progress Notes (Signed)
Utilization review completed. Kitzia Camus, RN, BSN. 

## 2015-12-28 NOTE — Progress Notes (Signed)
**Andrew Andrew** Andrew Andrew  Andrew Andrew [id number] DOB: 05/15/1941 DOA: 12/28/2015 PCP: Andrew Amel, MD  Admit HPI / Brief Narrative: 75 y.o. 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   Presents to ED with respiratory distress, chills, nausea, vomiting, non-productive cough. Symptoms of cough have persisted since a sinus infection 2 weeks ago which resolved. Symptoms markedly worsened today and are associated with fevers, chills and dyspnea.  On arrival to ED patient has increased O2 requirement from baseline. Fever 103.2, tachycardia to 140s, WBC 16.5k. Work up including UA and CXR is negative for source though PNA is still suspected given the increased O2 requirement  HPI/Subjective: 2/9 A/O 4, positive chills  Assessment/Plan: Sepsis unspecified organism/CAP/COPD exacerbation - -Bolus 2 L normal saline in ED -Work up negative for source, but PNA is still suspicious given the increased O2 requirement. -DuoNeb QID -Flutter valve -Mucinex DM -Continue current antibiotics -Titrate O2 to maintain SPO2 89-93%  Acute on chronic respiratory failure with hypoxia  - monitor O2 sat         Code Status: FULL Family Communication: no family present at time of exam Disposition Plan:     Consultants:   Procedure/Significant Events:    Culture 2/9 blood pending 2/9 urine pending 2/9 MRSA by PCR negative 2/9 strep pneumo/Legionella urine antigen pending 2/9 influenza panel pending 2/9 respiratory virus panel pending   Antibiotics: 2/9 Zosyn>> 2/9 vancomycin>>   DVT prophylaxis: Lovenox   Devices    LINES / TUBES:      Continuous Infusions: . sodium chloride 125 mL/hr at 12/28/15 0644    Objective: VITAL SIGNS: Temp: 97.5 F (36.4 C) (02/09 0630) Temp Source: Oral (02/09 0630) BP: 116/70 mmHg (02/09 0600) Pulse Rate: 101 (02/09  0600) SPO2; FIO2:   Intake/Output Summary (Last 24 hours) at 12/28/15 0733 Last data filed at 12/28/15 [id number]  Gross per 24 hour  Intake   2000 ml  Output      0 ml  Net   2000 ml     Exam: General:  A/O 4, positive chills, acute on chronic respiratory distress Eyes: Negative headache, negative scleral hemorrhage ENT: Negative Runny nose, negative gingival bleeding, Neck:  Negative scars, masses, torticollis, lymphadenopathy, JVD Lungs: diffusely diminished breath sounds (per patient bilateral lobe resection), without wheezes or crackles Cardiovascular: Tachycardia, Regular  rhythm without murmur gallop or rub normal S1 and S2 Abdomen:negative abdominal pain, nondistended, positive soft, bowel sounds, no rebound, no ascites, no appreciable mass Extremities: No significant cyanosis, clubbing, or edema bilateral lower extremities Psychiatric:  Negative depression, negative anxiety, negative fatigue, negative mania  Neurologic:  Cranial nerves II through XII intact, tongue/uvula midline, all extremities muscle strength 5/5, sensation intact throughout, negative dysarthria, negative expressive aphasia, negative receptive aphasia.   Data Reviewed: Basic Metabolic Panel:  Recent Labs Lab 12/28/15 0130  NA 138  K 3.8  CL 100*  CO2 23  GLUCOSE 177*  BUN 16  CREATININE 1.05  CALCIUM 9.2   Liver Function Tests:  Recent Labs Lab 12/28/15 0130  AST 20  ALT 25  ALKPHOS 69  BILITOT 0.8  PROT 6.8  ALBUMIN 3.9   No results for input(s): LIPASE, AMYLASE in the last 168 hours. No results for input(s): AMMONIA in the last 168 hours. CBC:  Recent Labs Lab 12/28/15 0130  WBC 16.8*  NEUTROABS 15.2*  HGB 13.9  HCT 41.1  MCV 95.8  PLT 226  Cardiac Enzymes: No results for input(s): CKTOTAL, CKMB, CKMBINDEX, TROPONINI in the last 168 hours. BNP (last 3 results) No results for input(s): BNP in the last 8760 hours.  ProBNP (last 3 results) No results for input(s): PROBNP  in the last 8760 hours.  CBG: No results for input(s): GLUCAP in the last 168 hours.  Recent Results (from the past 240 hour(s))  Blood Culture (routine x 2)     Status: None (Preliminary result)   Collection Time: 12/28/15  1:59 AM  Result Value Ref Range Status   Specimen Description BLOOD RIGHT FOREARM  Final   Special Requests BOTTLES DRAWN AEROBIC AND ANAEROBIC 5ML  Final   Culture PENDING  Incomplete   Report Status PENDING  Incomplete     Studies:  Recent x-ray studies have been reviewed in detail by the Attending Physician  Scheduled Meds:  Scheduled Meds: . diltiazem  240 mg Oral Daily  . enoxaparin (LOVENOX) injection  40 mg Subcutaneous Q24H  . finasteride  5 mg Oral Daily  . mometasone-formoterol  2 puff Inhalation BID  . multivitamin with minerals  1 tablet Oral Daily  . pantoprazole  40 mg Oral Daily  . piperacillin-tazobactam (ZOSYN)  IV  3.375 g Intravenous Q8H  . polyethylene glycol  17 g Oral QHS  . rosuvastatin  10 mg Oral q1800  . sildenafil  20 mg Oral BID  . sodium chloride flush  3 mL Intravenous Q12H  . temazepam  30 mg Oral QHS  . tiotropium  1 capsule Inhalation Daily  . vancomycin  500 mg Intravenous Q12H    Time spent on care of this patient: 40 mins   Andrew Andrew, Andrew Andrew , MD  [facility] Office  [phone] Pager [phone]  On-Call/Text Page:      [email or web address]      password [email or web address]  If 7PM-7AM, please contact night-coverage [email or web address] Password [email or web address] 12/28/2015, 7:33 AM   LOS: 0 days   Care during the described time interval was provided by me .  I have reviewed this patient's available data, including medical history, events of Andrew, physical examination, and all test results as part of my evaluation. I have personally reviewed and interpreted all radiology studies.   Andrew Crawford, MD [phone] Pager

## 2015-12-28 NOTE — ED Notes (Signed)
Pt. reports sudden onset SOB with productive cough , emesis and " shivering" this evening  . Denies fever .

## 2015-12-28 NOTE — ED Notes (Signed)
[facility] contacted to call Code Sepsis 

## 2015-12-28 NOTE — Progress Notes (Signed)
CRITICAL VALUE ALERT  Critical value received:  Lactic acid 2.4 Date of notification:  12/28/2015  Time of notification:  1130  Critical value read back:Yes.    Nurse who received alert:  Marian Sorrow  MD notified (1st page): Dr. Sherral Hammers  Time of first page:  1145  MD notified (2nd page)Dr. Sherral Hammers Time of second page:1300  Responding MD: Dia Crawford Time MD responded:  [phone]

## 2015-12-28 NOTE — Progress Notes (Signed)
Pharmacy Antibiotic Note  Andrew [name] is a 75 y.o. male admitted on 12/28/2015 with sepsis.  Pharmacy has been consulted for Vancocin and Zosyn dosing.  Plan: Rec'd vanc 1.5g and Zosyn 3.375g IV in ED; will continue with vancomycin 500mg  IV Q12H and Zosyn 3.375g IV Q8H and monitor CBC, Cx, levels prn.  Height: 5\' 7"  (170.2 cm) Weight: 149 lb 14.6 oz (68 kg) IBW/kg (Calculated) : 66.1  Temp (24hrs), Avg:101.2 F (38.4 C), Min:99.1 F (37.3 C), Max:103.2 F (39.6 C)   Recent Labs Lab 12/28/15 0130 12/28/15 0204  WBC 16.8*  --   CREATININE 1.05  --   LATICACIDVEN  --  2.53*    Estimated Creatinine Clearance: 56 mL/min (by C-G formula based on Cr of 1.05).    No Known Allergies   Thank you for allowing pharmacy to be a part of this patient's care.  Wynona Neat, PharmD, BCPS  12/28/2015 3:27 AM

## 2015-12-28 NOTE — ED Provider Notes (Signed)
CSN: [id number]     Arrival date & time 12/28/15  0109 History  By signing my name below, I, Andrew [name], attest that this documentation has been prepared under the direction and in the presence of [name], MD. Electronically Signed: [name], ED Scribe. 12/28/2015. 1:51 AM   Chief Complaint  Patient presents with  . COPD    The history is provided by the patient. No language interpreter was used.   Andrew [name] is a 75 y.o. male with history of emphysema and pulmonary HTN who presents to the Emergency Department complaining of a variably productive cough with onset 2 weeks ago. He states that he got over a sinus infection 2 weeks ago but the cough has lingered and notes that it is productive at night.  Pt is on 2 L of oxygen in the daytime and 3 L at night but has still been short of breath. He did not have a breathing treatment at home. Associated symptoms include chills/shivering at night, nausea, and emesis. Denies any change in bowel pattern (notes taking Miralax which occasionally makes his stools soft, but this is not new) and difficulty urinating.  Pt denies history of pneumonia and any recent hospitalization.  He takes a daily 81 mg aspirin but denies other blood thinners. Past surgical history includes lobectomy.    Past Medical History  Diagnosis Date  . Emphysema lung ([place])   . Hypertension   . Pulmonary hypertension [facility])    Past Surgical History  Procedure Laterality Date  . Tonsillectomy    . Lung surgery     Family History  Problem Relation Age of Onset  . Bone cancer Maternal Uncle    Social History  Substance Use Topics  . Smoking status: Former Smoker -- 0.00 packs/day for 0 years    Types: Cigarettes    Quit date: 10/01/1984  . Smokeless tobacco: None  . Alcohol Use: No    Review of Systems  10 Systems reviewed and all are negative for acute change except as noted in the HPI.  Allergies  Review of patient's allergies indicates no known  allergies.  Home Medications   Prior to Admission medications   Medication Sig Start Date End Date Taking? Authorizing Provider  ADVAIR DISKUS 250-50 MCG/DOSE AEPB Inhale 1 puff into the lungs 2 (two) times daily. 11/01/15   Noralee Space, MD  azithromycin (ZITHROMAX Z-PAK) 250 MG tablet Use as directed 11/01/15   Noralee Space, MD  calcium citrate (CALCITRATE - DOSED IN MG ELEMENTAL CALCIUM) 950 MG tablet Take 200 mg of elemental calcium by mouth daily.    Historical Provider, MD  diltiazem (CARDIZEM LA) 120 MG 24 hr tablet Take 240 mg by mouth daily.    Historical Provider, MD  esomeprazole (NEXIUM) 20 MG capsule Take 20 mg by mouth daily at 12 noon.    Historical Provider, MD  finasteride (PROSCAR) 5 MG tablet Take 10 mg by mouth daily.    Historical Provider, MD  Multiple Vitamins-Minerals (MULTIVITAMIN WITH MINERALS) tablet Take 1 tablet by mouth daily.    Historical Provider, MD  polyethylene glycol (MIRALAX / GLYCOLAX) packet Take 17 g by mouth at bedtime.    Historical Provider, MD  rosuvastatin (CRESTOR) 10 MG tablet Take 10 mg by mouth daily.    Historical Provider, MD  sildenafil (REVATIO) 20 MG tablet Take 20 mg by mouth 2 (two) times daily.     Historical Provider, MD  SPIRIVA HANDIHALER 18 MCG inhalation capsule Place 1  capsule (18 mcg total) into inhaler and inhale daily. 11/01/15   Noralee Space, MD  temazepam (RESTORIL) 30 MG capsule Take 30 mg by mouth at bedtime.    Historical Provider, MD  vitamin E 1000 UNIT capsule Take 1,000 Units by mouth daily.    Historical Provider, MD   BP 129/76 mmHg  Pulse 142  Temp(Src) 99.1 F (37.3 C) (Oral)  Resp 22  SpO2 80% Physical Exam  Constitutional: He is oriented to person, place, and time. Vital signs are normal. He appears well-developed and well-nourished.  Non-toxic appearance. He does not appear ill. He appears distressed. Nasal cannula in place.  HENT:  Head: Normocephalic and atraumatic.  Nose: Nose normal.   Mouth/Throat: Oropharynx is clear and moist. No oropharyngeal exudate.  Eyes: Conjunctivae and EOM are normal. Pupils are equal, round, and reactive to light. No scleral icterus.  Neck: Normal range of motion. Neck supple. No tracheal deviation, no edema, no erythema and normal range of motion present. No thyroid mass and no thyromegaly present.  Cardiovascular: Regular rhythm, S1 normal, S2 normal, normal heart sounds, intact distal pulses and normal pulses.  Tachycardia present.  Exam reveals no gallop and no friction rub.   No murmur heard. Pulmonary/Chest: Breath sounds normal. Tachypnea noted. He is in respiratory distress. He has no wheezes. He has no rhonchi. He has no rales.  [place] in place at 4L Increased work of breathing  Abdominal: Soft. Normal appearance and bowel sounds are normal. He exhibits no distension, no ascites and no mass. There is no hepatosplenomegaly. There is no tenderness. There is no rebound, no guarding and no CVA tenderness.  Musculoskeletal: Normal range of motion. He exhibits no edema or tenderness.  Lymphadenopathy:    He has no cervical adenopathy.  Neurological: He is alert and oriented to person, place, and time. He has normal strength. No cranial nerve deficit or sensory deficit.  Skin: Skin is warm and intact. No petechiae and no rash noted. He is diaphoretic. No erythema. No pallor.  Tactile fever  Psychiatric: He has a normal mood and affect. His behavior is normal. Judgment normal.  Nursing note and vitals reviewed.   ED Course  Procedures (including critical care time) COORDINATION OF CARE: 1:42 AM Discussed treatment plan which includes lab work, CXR, EKG, Zosyn, vancomycin, IVF, a breathing treatment, and admission to the hospital  with pt at bedside and pt agreed to plan.  Labs Review Labs Reviewed  CBC - Abnormal; Notable for the following:    WBC 16.8 (*)    All other components within normal limits  COMPREHENSIVE METABOLIC PANEL - Abnormal;  Notable for the following:    Chloride 100 (*)    Glucose, Bld 177 (*)    All other components within normal limits  DIFFERENTIAL - Abnormal; Notable for the following:    Neutro Abs 15.2 (*)    All other components within normal limits  I-STAT CG4 LACTIC ACID, ED - Abnormal; Notable for the following:    Lactic Acid, Venous 2.53 (*)    All other components within normal limits  CULTURE, BLOOD (ROUTINE X 2)  CULTURE, BLOOD (ROUTINE X 2)  URINE CULTURE  CBC WITH DIFFERENTIAL/PLATELET  URINALYSIS, ROUTINE W REFLEX MICROSCOPIC (NOT AT [facility])  Randolm Idol, ED    Imaging Review Dg Chest Port 1 View  12/28/2015  CLINICAL DATA:  75 year old male with fever and cough EXAM: PORTABLE CHEST 1 VIEW COMPARISON:  Chest radiograph dated 10/02/2015 FINDINGS: There is severe falls  emphysematous changes of the lungs. There are bibasilar atelectatic changes/ scarring. There is a large bulla in the right upper lobe. No focal consolidation, pleural effusion, or pneumothorax. The cardiac silhouette is within normal limits. No acute osseous pathology. IMPRESSION: Emphysema.  No focal consolidation or pneumothorax. Electronically Signed   By: Anner Crete M.D.   On: 12/28/2015 02:01   I have personally reviewed and evaluated these images and lab results as part of my medical decision-making.   EKG Interpretation None      MDM   Final diagnoses:  None    Patient presents to the ED for respiratory distress.  This is likely sepsis from pneumonia.  He is hypoxic, tachycardic, tachypneic and feels warm to touch.  Will obtain rectal temperature. Code sepsis called and patient will be given abx.  He will certainly be admitted for further care.  Surprisingly, CXR is negative for pneumonia.  Patient is still req inc O2 compared to his normal and is febrile.  He was given tylenol and broad spectrum abx still running.  Will obtain UA as well.  Lactate is 2.53 and WBC is 16.8.  Will page [facility]  for admission.    CRITICAL CARE Performed by: [name]   Total critical care time: 40 minutes - respiratory distress  Critical care time was exclusive of separately billable procedures and treating other patients.  Critical care was necessary to treat or prevent imminent or life-threatening deterioration.  Critical care was time spent personally by me on the following activities: development of treatment plan with patient and/or surrogate as well as nursing, discussions with consultants, evaluation of patient's response to treatment, examination of patient, obtaining history from patient or surrogate, ordering and performing treatments and interventions, ordering and review of laboratory studies, ordering and review of radiographic studies, pulse oximetry and re-evaluation of patient's condition.    I personally performed the services described in this documentation, which was scribed in my presence. The recorded information has been reviewed and is accurate.     [name], MD 12/28/15 [phone]

## 2015-12-29 DIAGNOSIS — N400 Enlarged prostate without lower urinary tract symptoms: Secondary | ICD-10-CM | POA: Diagnosis present

## 2015-12-29 LAB — CBC WITH DIFFERENTIAL/PLATELET
BASOS PCT: 0 %
Basophils Absolute: 0 10*3/uL (ref 0.0–0.1)
Eosinophils Absolute: 0 10*3/uL (ref 0.0–0.7)
Eosinophils Relative: 0 %
HEMATOCRIT: 31.6 % — AB (ref 39.0–52.0)
HEMOGLOBIN: 11 g/dL — AB (ref 13.0–17.0)
LYMPHS ABS: 0.4 10*3/uL — AB (ref 0.7–4.0)
Lymphocytes Relative: 3 %
MCH: 33.2 pg (ref 26.0–34.0)
MCHC: 34.8 g/dL (ref 30.0–36.0)
MCV: 95.5 fL (ref 78.0–100.0)
MONO ABS: 0.2 10*3/uL (ref 0.1–1.0)
MONOS PCT: 2 %
NEUTROS ABS: 10.5 10*3/uL — AB (ref 1.7–7.7)
NEUTROS PCT: 95 %
Platelets: 181 10*3/uL (ref 150–400)
RBC: 3.31 MIL/uL — ABNORMAL LOW (ref 4.22–5.81)
RDW: 13.1 % (ref 11.5–15.5)
WBC: 11.1 10*3/uL — ABNORMAL HIGH (ref 4.0–10.5)

## 2015-12-29 LAB — COMPREHENSIVE METABOLIC PANEL
ALBUMIN: 2.7 g/dL — AB (ref 3.5–5.0)
ALK PHOS: 49 U/L (ref 38–126)
ALT: 21 U/L (ref 17–63)
AST: 15 U/L (ref 15–41)
Anion gap: 7 (ref 5–15)
BUN: 9 mg/dL (ref 6–20)
CALCIUM: 8.3 mg/dL — AB (ref 8.9–10.3)
CHLORIDE: 110 mmol/L (ref 101–111)
CO2: 23 mmol/L (ref 22–32)
CREATININE: 0.81 mg/dL (ref 0.61–1.24)
GFR calc Af Amer: >60 mL/min (ref >60)
GFR calc non Af Amer: >60 mL/min (ref >60)
GLUCOSE: 133 mg/dL — AB (ref 65–99)
Potassium: 3.9 mmol/L (ref 3.5–5.1)
SODIUM: 140 mmol/L (ref 135–145)
Total Bilirubin: 0.5 mg/dL (ref 0.3–1.2)
Total Protein: 5.7 g/dL — ABNORMAL LOW (ref 6.5–8.1)

## 2015-12-29 LAB — LEGIONELLA ANTIGEN, URINE

## 2015-12-29 LAB — LACTIC ACID, PLASMA: LACTIC ACID, VENOUS: 1.2 mmol/L (ref 0.5–2.0)

## 2015-12-29 LAB — URINE CULTURE: Culture: NO GROWTH

## 2015-12-29 LAB — MAGNESIUM: MAGNESIUM: 1.8 mg/dL (ref 1.7–2.4)

## 2015-12-29 NOTE — Progress Notes (Signed)
Progress Note   Andrew [name] OFB:[id number] DOB: [birth year] DOA: 2016-01-03 PCP: Lujean Amel, MD   Brief Narrative:   Andrew [name] is an 75 y.o. male with a PMH of COPD, PAH, chronic respiratory failure on 2 L of home oxygen in the day and 3 L at bedtime who was admitted 2016-01-03 with a chief complaint of progressive cough that started with a sinus infection 2 weeks prior to presentation, the recent onset of nausea and vomiting, and worsening respiratory status. Upon arrival to the ED, the patient was noted to be febrile (103.93F), tachycardic with heart rate in the 140s, with increased oxygen requirement and a WBC of 16.5. Chest x-ray initially showed emphysema without evidence of focal consolidation.  Assessment/Plan:   Principal Problem:   Sepsis ([place]) secondary to suspected community-acquired, possibly viral pneumonia - Sepsis criteria met on admission. Given increased oxygen requirement and cough, source felt to be from lungs. - Influenza panel negative. Strep pneumonia antigen negative. Legionella antigen and respiratory virus panel pending. - Lactic acid improved calcitonin both elevated, consistent with sepsis. - Initially managed with vancomycin and Zosyn, but no concerns for HCAP & MRSA screen neg, so will d/c Vanc. - Received IV fluid boluses in the ED. - Continue droplet precautions until respiratory virus panel resulted. - Repeat lactic acid.  Active Problems:   COPD with emphysema ([place])   History of pulmonary hypertension   Acute on chronic respiratory failure with hypoxia ([facility]) - Continue supplemental oxygen. - Continue Solu-Medrol. - Continue sildenafil. - Continue flutter valve.  Acute urinary retention - Foley placed.  Will need voiding trial 12/30/15.    DVT Prophylaxis - Lovenox ordered.   Family Communication/Anticipated D/C date and plan/Code Status   Family Communication: No family currently at the bedside. Disposition Plan: Home when respiratory  status stable and patient afebrile for at least 24 hours. Code Status: DO NOT RESUSCITATE.   IV Access:    Peripheral IV   Procedures and diagnostic studies:   Dg Chest Port 1 View  2016-01-03  CLINICAL DATA:  75 year old male with fever and cough EXAM: PORTABLE CHEST 1 VIEW COMPARISON:  Chest radiograph dated 10/02/2015 FINDINGS: There is severe falls emphysematous changes of the lungs. There are bibasilar atelectatic changes/ scarring. There is a large bulla in the right upper lobe. No focal consolidation, pleural effusion, or pneumothorax. The cardiac silhouette is within normal limits. No acute osseous pathology. IMPRESSION: Emphysema.  No focal consolidation or pneumothorax. Electronically Signed   By: Anner Crete M.D.   On: Jan 03, 2016 02:01     Medical Consultants:    None.  Anti-Infectives:   Vancomycin January 03, 2016---> 12/29/15 Zosyn 2016/01/03--->   Subjective:    [name] is still having some dyspnea and cough productive of clear sputum.  Appetite is fair.  No N/V. Had to have a foley placed for urinary retention.  Objective:    Filed Vitals:   12/29/15 0700 12/29/15 0800 12/29/15 0833 12/29/15 0908  BP: 128/63 130/67  158/68  Pulse: 81 83    Temp:      TempSrc:      Resp: 19 21    Height:      Weight:      SpO2: 95% 96% 96%     Intake/Output Summary (Last 24 hours) at 12/29/15 0909 Last data filed at 12/29/15 0700  Gross per 24 hour  Intake 4626.52 ml  Output   2695 ml  Net 1931.52 ml   Filed Weights   2016-01-03 0300  12/28/15 0630  Weight: 68 kg (149 lb 14.6 oz) 66.2 kg (145 lb 15.1 oz)    Exam: Gen:  NAD Cardiovascular:  RRR, No M/R/G Respiratory:  Lungs CTAB Gastrointestinal:  Abdomen soft, NT/ND, + BS Extremities:  No C/E/C   Data Reviewed:    Labs: Basic Metabolic Panel:  Recent Labs Lab 12/28/15 0130 12/28/15 0954  NA 138 141  K 3.8 4.0  CL 100* 109  CO2 23 23  GLUCOSE 177* 150*  BUN 16 10  CREATININE 1.05 0.95  CALCIUM  9.2 8.4*  MG  --  1.8   GFR Estimated Creatinine Clearance: 61.9 mL/min (by C-G formula based on Cr of 0.95). Liver Function Tests:  Recent Labs Lab 12/28/15 0130 12/28/15 0954  AST 20 22  ALT 25 22  ALKPHOS 69 56  BILITOT 0.8 0.5  PROT 6.8 5.8*  ALBUMIN 3.9 3.0*   CBC:  Recent Labs Lab 12/28/15 0130 12/28/15 0954  WBC 16.8* 21.1*  NEUTROABS 15.2* 19.2*  HGB 13.9 11.9*  HCT 41.1 35.8*  MCV 95.8 95.7  PLT 226 188   Sepsis Labs:  Recent Labs Lab 12/28/15 0130 12/28/15 0204 12/28/15 0327 12/28/15 0637 12/28/15 0954  PROCALCITON  --   --  0.36  --   --   WBC 16.8*  --   --   --  21.1*  LATICACIDVEN  --  2.53* 1.2 1.1 2.4*   Microbiology Recent Results (from the past 240 hour(s))  Blood Culture (routine x 2)     Status: None (Preliminary result)   Collection Time: 12/28/15  1:59 AM  Result Value Ref Range Status   Specimen Description BLOOD RIGHT FOREARM  Final   Special Requests BOTTLES DRAWN AEROBIC AND ANAEROBIC 5ML  Final   Culture PENDING  Incomplete   Report Status PENDING  Incomplete  MRSA PCR Screening     Status: None   Collection Time: 12/28/15  6:30 AM  Result Value Ref Range Status   MRSA by PCR NEGATIVE NEGATIVE Final    Comment:        The GeneXpert MRSA Assay (FDA approved for NASAL specimens only), is one component of a comprehensive MRSA colonization surveillance program. It is not intended to diagnose MRSA infection nor to guide or monitor treatment for MRSA infections.      Medications:   . antiseptic oral rinse  7 mL Mouth Rinse BID  . dextromethorphan-guaiFENesin  1 tablet Oral BID  . diltiazem  240 mg Oral Daily  . enoxaparin (LOVENOX) injection  40 mg Subcutaneous Q24H  . finasteride  5 mg Oral Daily  . ipratropium-albuterol  3 mL Nebulization Q6H  . methylPREDNISolone (SOLU-MEDROL) injection  60 mg Intravenous Q24H  . mometasone-formoterol  2 puff Inhalation BID  . multivitamin with minerals  1 tablet Oral Daily  .  pantoprazole  40 mg Oral Daily  . piperacillin-tazobactam (ZOSYN)  IV  3.375 g Intravenous Q8H  . polyethylene glycol  17 g Oral QHS  . rosuvastatin  10 mg Oral q1800  . sildenafil  20 mg Oral BID  . sodium chloride flush  3 mL Intravenous Q12H  . temazepam  30 mg Oral QHS  . vancomycin  500 mg Intravenous Q12H   Continuous Infusions: . sodium chloride 125 mL/hr at 12/29/15 0600  . sodium chloride Stopped (12/28/15 6440)    Time spent: 35 minutes with > 50% of time discussing current diagnostic test results, clinical impression and plan of care.   LOS: 1 day  [facility] Pager [phone]. If unable to reach me by pager, please call my cell phone at [phone].  *Please refer to [email or web address], password [email or web address] to get updated schedule on who will round on this patient, as hospitalists switch teams weekly. If 7PM-7AM, please contact night-coverage at [email or web address], password [email or web address] for any overnight needs.  12/29/2015, 9:09 AM

## 2015-12-30 ENCOUNTER — Inpatient Hospital Stay (HOSPITAL_COMMUNITY): Payer: Medicare Other

## 2015-12-30 LAB — COMPREHENSIVE METABOLIC PANEL
ALBUMIN: 2.7 g/dL — AB (ref 3.5–5.0)
ALK PHOS: 48 U/L (ref 38–126)
ALT: 37 U/L (ref 17–63)
ANION GAP: 8 (ref 5–15)
AST: 20 U/L (ref 15–41)
BUN: 10 mg/dL (ref 6–20)
CO2: 24 mmol/L (ref 22–32)
Calcium: 8.4 mg/dL — ABNORMAL LOW (ref 8.9–10.3)
Chloride: 109 mmol/L (ref 101–111)
Creatinine, Ser: 0.75 mg/dL (ref 0.61–1.24)
GFR calc Af Amer: >60 mL/min (ref >60)
GFR calc non Af Amer: >60 mL/min (ref >60)
GLUCOSE: 166 mg/dL — AB (ref 65–99)
POTASSIUM: 3.4 mmol/L — AB (ref 3.5–5.1)
SODIUM: 141 mmol/L (ref 135–145)
Total Bilirubin: 0.4 mg/dL (ref 0.3–1.2)
Total Protein: 5.1 g/dL — ABNORMAL LOW (ref 6.5–8.1)

## 2015-12-30 LAB — CBC WITH DIFFERENTIAL/PLATELET
Basophils Absolute: 0 10*3/uL (ref 0.0–0.1)
Basophils Relative: 0 %
Eosinophils Absolute: 0 10*3/uL (ref 0.0–0.7)
Eosinophils Relative: 0 %
HEMATOCRIT: 32.5 % — AB (ref 39.0–52.0)
Hemoglobin: 10.7 g/dL — ABNORMAL LOW (ref 13.0–17.0)
LYMPHS ABS: 0.4 10*3/uL — AB (ref 0.7–4.0)
LYMPHS PCT: 6 %
MCH: 31.4 pg (ref 26.0–34.0)
MCHC: 32.9 g/dL (ref 30.0–36.0)
MCV: 95.3 fL (ref 78.0–100.0)
MONO ABS: 0.1 10*3/uL (ref 0.1–1.0)
MONOS PCT: 2 %
NEUTROS ABS: 6.1 10*3/uL (ref 1.7–7.7)
Neutrophils Relative %: 92 %
Platelets: 166 10*3/uL (ref 150–400)
RBC: 3.41 MIL/uL — ABNORMAL LOW (ref 4.22–5.81)
RDW: 13.1 % (ref 11.5–15.5)
WBC: 6.6 10*3/uL (ref 4.0–10.5)

## 2015-12-30 LAB — MAGNESIUM: Magnesium: 1.8 mg/dL (ref 1.7–2.4)

## 2015-12-30 MED ORDER — ENSURE ENLIVE PO LIQD
237.0000 mL | Freq: Two times a day (BID) | ORAL | Status: DC
  Administered 2015-12-30 – 2016-01-03 (×5): 237 mL via ORAL

## 2015-12-30 MED ORDER — SODIUM CHLORIDE 0.9 % IV SOLN: INTRAVENOUS | Status: DC

## 2015-12-30 MED ORDER — ALBUTEROL SULFATE (2.5 MG/3ML) 0.083% IN NEBU
2.5000 mg | INHALATION_SOLUTION | RESPIRATORY_TRACT | Status: DC | PRN
  Administered 2016-01-01: 2.5 mg via RESPIRATORY_TRACT
  Filled 2015-12-30: qty 3

## 2015-12-30 MED ORDER — ALPRAZOLAM 0.25 MG PO TABS
0.2500 mg | ORAL_TABLET | Freq: Three times a day (TID) | ORAL | Status: DC | PRN
  Administered 2016-01-01 – 2016-01-02 (×3): 0.25 mg via ORAL
  Filled 2015-12-30 (×3): qty 1

## 2015-12-30 NOTE — Progress Notes (Signed)
Progress Note   Andrew [name] [id number] DOB: 24-Oct-1941 DOA: 01/19/16 PCP: Lujean Amel, MD   Brief Narrative:   Andrew [name] is an 75 y.o. male with a PMH of COPD, PAH, chronic respiratory failure on 2 L of home oxygen in the day and 3 L at bedtime who was admitted Jan 19, 2016 with a chief complaint of progressive cough that started with a sinus infection 2 weeks prior to presentation, the recent onset of nausea and vomiting, and worsening respiratory status. Upon arrival to the ED, the patient was noted to be febrile (103.26F), tachycardic with heart rate in the 140s, with increased oxygen requirement and a WBC of 16.5. Chest x-ray initially showed emphysema without evidence of focal consolidation.  Assessment/Plan:   Principal Problem:   Sepsis ([place]) secondary to suspected community-acquired, possibly viral pneumonia - Sepsis criteria met on admission. Given increased oxygen requirement and cough, source felt to be from lungs. - Influenza panel negative. Strep pneumonia antigen negative. Legionella antigen negative  and respiratory virus panel pending. - Lactic acid improved calcitonin both elevated, consistent with sepsis. - Initially managed with vancomycin and Zosyn, but no concerns for HCAP & MRSA screen neg, so Vanc was discontinue 2-10 - Received IV fluid boluses in the ED. - Continue droplet precautions until respiratory virus panel resulted. - lactic acid decreased to 1.2.  -continue with zosyn.   Active Problems:   COPD with emphysema ([place])   History of pulmonary hypertension   Acute on chronic respiratory failure with hypoxia (HCC) - Continue supplemental oxygen. - Continue Solu-Medrol. - Continue sildenafil. - Continue flutter valve. -Nebulizer  Acute urinary retention - Foley placed.  Will ordered voiding trial 12/30/15.    DVT Prophylaxis - Lovenox ordered.   Family Communication/Anticipated D/C date and plan/Code Status   Family Communication: No  family currently at the bedside. Disposition Plan: Home when respiratory status stable and patient afebrile for at least 24 hours., transfer to med-surgery  Code Status: DO NOT RESUSCITATE.   IV Access:    Peripheral IV   Procedures and diagnostic studies:   Dg Chest Port 1 View  2016-01-19  CLINICAL DATA:  75 year old male with fever and cough EXAM: PORTABLE CHEST 1 VIEW COMPARISON:  Chest radiograph dated 10/02/2015 FINDINGS: There is severe falls emphysematous changes of the lungs. There are bibasilar atelectatic changes/ scarring. There is a large bulla in the right upper lobe. No focal consolidation, pleural effusion, or pneumothorax. The cardiac silhouette is within normal limits. No acute osseous pathology. IMPRESSION: Emphysema.  No focal consolidation or pneumothorax. Electronically Signed   By: Anner Crete M.D.   On: January 19, 2016 02:01     Medical Consultants:    None.  Anti-Infectives:   Vancomycin Jan 19, 2016---> 12/29/15 Zosyn Jan 19, 2016--->   Subjective:    Andrew [name] is feeling better, breathing better  . Had BM 2 days ago. Not eating well.   Objective:    Filed Vitals:   12/30/15 0358 12/30/15 0400 12/30/15 0800 12/30/15 0824  BP:  122/68 140/76   Pulse:  89 67   Temp: 97.6 F (36.4 C)  98.7 F (37.1 C)   TempSrc: Oral  Rectal   Resp:  18 17   Height:      Weight:      SpO2:  96% 96% 94%    Intake/Output Summary (Last 24 hours) at 12/30/15 1148 Last data filed at 12/30/15 1000  Gross per 24 hour  Intake   3488 ml  Output   2365 ml  Net   1123 ml   Filed Weights   12/28/15 0300 12/28/15 0630  Weight: 68 kg (149 lb 14.6 oz) 66.2 kg (145 lb 15.1 oz)    Exam: Gen:  NAD Cardiovascular:  RRR, No M/R/G Respiratory:  Lungs CTAB Gastrointestinal:  Abdomen soft, NT/ND, + BS Extremities:  No C/E/C   Data Reviewed:    Labs: Basic Metabolic Panel:  Recent Labs Lab 12/28/15 0130 12/28/15 0954 12/29/15 0541 12/30/15 0233  NA 138 141 140  141  K 3.8 4.0 3.9 3.4*  CL 100* 109 110 109  CO2 _0 GLUCOSE 177* 150* 133* 166*  BUN _1 CREATININE 1.05 0.95 0.81 0.75  CALCIUM 9.2 8.4* 8.3* 8.4*  MG  --  1.8 1.8 1.8   GFR Estimated Creatinine Clearance: 73.6 mL/min (by C-G formula based on Cr of 0.75). Liver Function Tests:  Recent Labs Lab 12/28/15 0130 12/28/15 0954 12/29/15 0541 12/30/15 0233  AST _2 ALT _3 37  ALKPHOS 69 56 49 48  BILITOT 0.8 0.5 0.5 0.4  PROT 6.8 5.8* 5.7* 5.1*  ALBUMIN 3.9 3.0* 2.7* 2.7*   CBC:  Recent Labs Lab 12/28/15 0130 12/28/15 0954 12/29/15 0541 12/30/15 0233  WBC 16.8* 21.1* 11.1* 6.6  NEUTROABS 15.2* 19.2* 10.5* 6.1  HGB 13.9 11.9* 11.0* 10.7*  HCT 41.1 35.8* 31.6* 32.5*  MCV 95.8 95.7 95.5 95.3  PLT 226 188 181 166   Sepsis Labs:  Recent Labs Lab 12/28/15 0130  12/28/15 0327 12/28/15 0637 12/28/15 0954 12/29/15 0541 12/29/15 1439 12/30/15 0233  PROCALCITON  --   --  0.36  --   --   --   --   --   WBC 16.8*  --   --   --  21.1* 11.1*  --  6.6  LATICACIDVEN  --   < > 1.2 1.1 2.4*  --  1.2  --   < > = values in this interval not displayed. Microbiology Recent Results (from the past 240 hour(s))  Blood Culture (routine x 2)     Status: None (Preliminary result)   Collection Time: 12/28/15  1:44 AM  Result Value Ref Range Status   Specimen Description BLOOD LEFT ARM  Final   Special Requests BOTTLES DRAWN AEROBIC AND ANAEROBIC 5ML  Final   Culture NO GROWTH 2 DAYS  Final   Report Status PENDING  Incomplete  Blood Culture (routine x 2)     Status: None (Preliminary result)   Collection Time: 12/28/15  1:59 AM  Result Value Ref Range Status   Specimen Description BLOOD RIGHT FOREARM  Final   Special Requests BOTTLES DRAWN AEROBIC AND ANAEROBIC 5ML  Final   Culture NO GROWTH 2 DAYS  Final   Report Status PENDING  Incomplete  Urine culture     Status: None   Collection Time: 12/28/15  2:30 AM  Result Value Ref Range Status    Specimen Description URINE, CATHETERIZED  Final   Special Requests NONE  Final   Culture NO GROWTH 1 DAY  Final   Report Status 12/29/2015 FINAL  Final  MRSA PCR Screening     Status: None   Collection Time: 12/28/15  6:30 AM  Result Value Ref Range Status   MRSA by PCR NEGATIVE NEGATIVE Final    Comment:        The GeneXpert MRSA Assay (FDA approved for NASAL specimens only), is one component of a comprehensive  MRSA colonization surveillance program. It is not intended to diagnose MRSA infection nor to guide or monitor treatment for MRSA infections.      Medications:   . antiseptic oral rinse  7 mL Mouth Rinse BID  . dextromethorphan-guaiFENesin  1 tablet Oral BID  . diltiazem  240 mg Oral Daily  . enoxaparin (LOVENOX) injection  40 mg Subcutaneous Q24H  . finasteride  5 mg Oral Daily  . ipratropium-albuterol  3 mL Nebulization Q6H  . methylPREDNISolone (SOLU-MEDROL) injection  60 mg Intravenous Q24H  . mometasone-formoterol  2 puff Inhalation BID  . multivitamin with minerals  1 tablet Oral Daily  . pantoprazole  40 mg Oral Daily  . piperacillin-tazobactam (ZOSYN)  IV  3.375 g Intravenous Q8H  . polyethylene glycol  17 g Oral QHS  . rosuvastatin  10 mg Oral q1800  . sildenafil  20 mg Oral BID  . sodium chloride flush  3 mL Intravenous Q12H  . temazepam  30 mg Oral QHS   Continuous Infusions: . sodium chloride 125 mL/hr at 12/30/15 0800  . sodium chloride 10 mL/hr at 12/29/15 1300    Time spent: 35 minutes with > 50% of time discussing current diagnostic test results, clinical impression and plan of care.   LOS: 2 days   Niel Hummer A  [facility] Pager [phone]  Please refer to [email or web address], password [email or web address] to get updated schedule on who will round on this patient, as hospitalists switch teams weekly. If 7PM-7AM, please contact night-coverage at [email or web address], password [email or web address] for any overnight needs.  12/30/2015, 11:48 AM

## 2015-12-30 NOTE — Progress Notes (Signed)
Order to d/c foley. Spoke with MD regarding order since pt had foley placed due to 1.7L retained urine evening of 12/27/15.  Pt says he has spasms at times and difficulty with passing urine when in hospital setting.  MD says to still remove catheter. Pt to transfer to med-surg.  If patient has difficulty with urine, bladder scan and call to place another foley catheter.

## 2015-12-31 DIAGNOSIS — J441 Chronic obstructive pulmonary disease with (acute) exacerbation: Secondary | ICD-10-CM

## 2015-12-31 LAB — CBC WITH DIFFERENTIAL/PLATELET
Basophils Absolute: 0 10*3/uL (ref 0.0–0.1)
Basophils Relative: 0 %
Eosinophils Absolute: 0 10*3/uL (ref 0.0–0.7)
Eosinophils Relative: 0 %
HEMATOCRIT: 33.6 % — AB (ref 39.0–52.0)
HEMOGLOBIN: 11.1 g/dL — AB (ref 13.0–17.0)
LYMPHS ABS: 0.4 10*3/uL — AB (ref 0.7–4.0)
Lymphocytes Relative: 8 %
MCH: 31.2 pg (ref 26.0–34.0)
MCHC: 33 g/dL (ref 30.0–36.0)
MCV: 94.4 fL (ref 78.0–100.0)
MONOS PCT: 4 %
Monocytes Absolute: 0.2 10*3/uL (ref 0.1–1.0)
NEUTROS ABS: 4.8 10*3/uL (ref 1.7–7.7)
NEUTROS PCT: 88 %
Platelets: 171 10*3/uL (ref 150–400)
RBC: 3.56 MIL/uL — AB (ref 4.22–5.81)
RDW: 13.1 % (ref 11.5–15.5)
WBC: 5.4 10*3/uL (ref 4.0–10.5)

## 2015-12-31 LAB — COMPREHENSIVE METABOLIC PANEL
ALT: 51 U/L (ref 17–63)
ANION GAP: 10 (ref 5–15)
AST: 20 U/L (ref 15–41)
Albumin: 2.8 g/dL — ABNORMAL LOW (ref 3.5–5.0)
Alkaline Phosphatase: 46 U/L (ref 38–126)
BUN: 12 mg/dL (ref 6–20)
CHLORIDE: 107 mmol/L (ref 101–111)
CO2: 23 mmol/L (ref 22–32)
Calcium: 8.6 mg/dL — ABNORMAL LOW (ref 8.9–10.3)
Creatinine, Ser: 0.73 mg/dL (ref 0.61–1.24)
Glucose, Bld: 152 mg/dL — ABNORMAL HIGH (ref 65–99)
POTASSIUM: 3.9 mmol/L (ref 3.5–5.1)
SODIUM: 140 mmol/L (ref 135–145)
Total Bilirubin: 0.3 mg/dL (ref 0.3–1.2)
Total Protein: 5.3 g/dL — ABNORMAL LOW (ref 6.5–8.1)

## 2015-12-31 LAB — MAGNESIUM: MAGNESIUM: 1.9 mg/dL (ref 1.7–2.4)

## 2015-12-31 MED ORDER — LEVOFLOXACIN IN D5W 500 MG/100ML IV SOLN
500.0000 mg | INTRAVENOUS | Status: AC
  Administered 2015-12-31 – 2016-01-02 (×3): 500 mg via INTRAVENOUS
  Filled 2015-12-31 (×4): qty 100

## 2015-12-31 MED ORDER — TAMSULOSIN HCL 0.4 MG PO CAPS
0.4000 mg | ORAL_CAPSULE | Freq: Every day | ORAL | Status: DC
  Administered 2015-12-31 – 2016-01-03 (×4): 0.4 mg via ORAL
  Filled 2015-12-31 (×5): qty 1

## 2015-12-31 NOTE — Progress Notes (Signed)
[facility] PROGRESS NOTE    Progress Note   Andrew [name] [id number] DOB: [birth year] DOA: 12/28/2015 PCP: Lujean Amel, MD   Brief Narrative:   Andrew [name] is an 75 y.o. male  with a PMH of COPD, PAH, chronic respiratory failure on 2 L of home oxygen in the day and 3 L at bedtime who was admitted 12/28/15 with a chief complaint of progressive cough that started with a sinus infection 2 weeks prior to presentation, the recent onset of nausea and vomiting, and worsening respiratory status. Upon arrival to the ED, the patient was noted to be febrile (103.36F), tachycardic with heart rate in the 140s, with increased oxygen requirement and a WBC of 16.5.  Assessment/Plan:   Sepsis ([place]) secondary suspected community-acquired pneumonia: Given oxygen requirement on admission with cough source felt to be likely from lung. Influenza PCR was negative, lactic acid resolve with IV fluids. He was started on admission on vancomycin and Zosyn MRSA swab screening was negative and vancomycin was eventually discontinued. De-escalate antibiotic regimen to Levaquin.  Acute on chronic respiratory failure with hypoxia COPD with emphysema (HCC)/pulmonary hypertension: Started on IV Solu-Medrol inhalers and antibiotics on admission. Saturations have remained stable. We'll get physical therapy patient can be transferred to regular floor. Continue send in a fill-in flutter valve.  Acute urinary retention: Foley placed, continue finasteride and start flomax. Family to follow-up with urology in 2 weeks. We'll give a voiding trial in 24 hours. History of pulmonary hypertension     DVT Prophylaxis - Lovenox ordered.  Family Communication: none Disposition Plan: Home in 2-3 days Code Status:     Code Status Orders        Start     Ordered   12/28/15 0319  Do not attempt resuscitation (DNR)   Continuous    Question Answer Comment  In the event of cardiac or respiratory ARREST Do not call a  "code blue"   In the event of cardiac or respiratory ARREST Do not perform Intubation, CPR, defibrillation or ACLS   In the event of cardiac or respiratory ARREST Use medication by any route, position, wound care, and other measures to relive pain and suffering. May use oxygen, suction and manual treatment of airway obstruction as needed for comfort.   Comments "No heroics" per patient      12/28/15 0318    Code Status History    Date Active Date Inactive Code Status Order ID Comments User Context   12/28/2015  3:17 AM 12/28/2015  3:18 AM Full Code [id number]  Etta Quill, DO ED        IV Access:    Peripheral IV   Procedures and diagnostic studies:   Dg Chest Port 1 View  2016/01/13  CLINICAL DATA:  Decreased respirations EXAM: PORTABLE CHEST 1 VIEW COMPARISON:  12/28/2015 FINDINGS: Emphysematous changes are again identified. There is new linear density identified in the left mid lung consistent with atelectasis or early infiltrate. Cardiac shadow is stable. No acute bony abnormality is noted. IMPRESSION: New linear density in the right mid lung. Electronically Signed   By: Inez Catalina M.D.   On: 01-13-2016 18:49   Dg Abd Portable 1v  January 13, 2016  CLINICAL DATA:  Abdominal pain EXAM: PORTABLE ABDOMEN - 1 VIEW COMPARISON:  None. FINDINGS: Scattered large and small bowel gas is noted. No obstructive changes are seen. No free air is noted. No acute bony abnormality is seen. IMPRESSION: No acute abnormality noted. Electronically Signed   By: Inez Catalina  M.D.   On: 12/30/2015 18:50     Medical Consultants:    None.  Anti-Infectives:   Anti-infectives    Start     Dose/Rate Route Frequency Ordered Stop   12/28/15 1200  vancomycin (VANCOCIN) 500 mg in sodium chloride 0.9 % 100 mL IVPB  Status:  Discontinued     500 mg 100 mL/hr over 60 Minutes Intravenous Every 12 hours 12/28/15 0330 12/29/15 0923   12/28/15 0800  piperacillin-tazobactam (ZOSYN) IVPB 3.375 g     3.375 g 12.5  mL/hr over 240 Minutes Intravenous Every 8 hours 12/28/15 0330     12/28/15 0200  piperacillin-tazobactam (ZOSYN) IVPB 3.375 g     3.375 g 100 mL/hr over 30 Minutes Intravenous  Once 12/28/15 0145 12/28/15 0241   12/28/15 0145  vancomycin (VANCOCIN) 1,500 mg in sodium chloride 0.9 % 500 mL IVPB     1,500 mg 250 mL/hr over 120 Minutes Intravenous  Once 12/28/15 0145 12/28/15 0411     Van 2.9.2017>>2.10.2017  Subjective:    Andrew [name] he relates his breathing and cough are much better. He still feels mildly distended.  Objective:    Filed Vitals:   12/31/15 0329 12/31/15 0740 12/31/15 0755 12/31/15 0900  BP: 131/64  145/74   Pulse: 65   94  Temp: 97.8 F (36.6 C)  98 F (36.7 C)   TempSrc: Oral  Oral   Resp: 19  17 17   Height:      Weight:      SpO2: 95% 94% 94% 94%    Intake/Output Summary (Last 24 hours) at 12/31/15 1025 Last data filed at 12/31/15 0950  Gross per 24 hour  Intake 2359.58 ml  Output   2700 ml  Net -340.42 ml   Filed Weights   12/28/15 0300 12/28/15 0630  Weight: 68 kg (149 lb 14.6 oz) 66.2 kg (145 lb 15.1 oz)    Exam: Gen:  NAD Cardiovascular:  RRR, No M/R/G Chest and lungs:   Moderate air movement and clear bilaterally. Abdomen:  Abdomen soft, NT/ND, + BS Extremities:  No edema   Data Reviewed:    Labs: Basic Metabolic Panel:  Recent Labs Lab 12/28/15 0130 12/28/15 0954 12/29/15 0541 12/30/15 0233 12/31/15 0550  NA 138 141 140 141 140  K 3.8 4.0 3.9 3.4* 3.9  CL 100* 109 110 109 107  CO2 23 23 23 24 23   GLUCOSE 177* 150* 133* 166* 152*  BUN 16 10 9 10 12   CREATININE 1.05 0.95 0.81 0.75 0.73  CALCIUM 9.2 8.4* 8.3* 8.4* 8.6*  MG  --  1.8 1.8 1.8 1.9   GFR Estimated Creatinine Clearance: 73.6 mL/min (by C-G formula based on Cr of 0.73). Liver Function Tests:  Recent Labs Lab 12/28/15 0130 12/28/15 0954 12/29/15 0541 12/30/15 0233 12/31/15 0550  AST 20 22 15 20 20   ALT 25 22 21  37 51  ALKPHOS 69 56 49 48 46    BILITOT 0.8 0.5 0.5 0.4 0.3  PROT 6.8 5.8* 5.7* 5.1* 5.3*  ALBUMIN 3.9 3.0* 2.7* 2.7* 2.8*   No results for input(s): LIPASE, AMYLASE in the last 168 hours. No results for input(s): AMMONIA in the last 168 hours. Coagulation profile No results for input(s): INR, PROTIME in the last 168 hours.  CBC:  Recent Labs Lab 12/28/15 0130 12/28/15 0954 12/29/15 0541 12/30/15 0233 12/31/15 0550  WBC 16.8* 21.1* 11.1* 6.6 5.4  NEUTROABS 15.2* 19.2* 10.5* 6.1 4.8  HGB 13.9 11.9* 11.0* 10.7* 11.1*  HCT 41.1 35.8* 31.6* 32.5* 33.6*  MCV 95.8 95.7 95.5 95.3 94.4  PLT 226 188 181 166 171   Cardiac Enzymes: No results for input(s): CKTOTAL, CKMB, CKMBINDEX, TROPONINI in the last 168 hours. BNP (last 3 results) No results for input(s): PROBNP in the last 8760 hours. CBG: No results for input(s): GLUCAP in the last 168 hours. D-Dimer: No results for input(s): DDIMER in the last 72 hours. Hgb A1c: No results for input(s): HGBA1C in the last 72 hours. Lipid Profile: No results for input(s): CHOL, HDL, LDLCALC, TRIG, CHOLHDL, LDLDIRECT in the last 72 hours. Thyroid function studies: No results for input(s): TSH, T4TOTAL, T3FREE, THYROIDAB in the last 72 hours.  Invalid input(s): FREET3 Anemia work up: No results for input(s): VITAMINB12, FOLATE, FERRITIN, TIBC, IRON, RETICCTPCT in the last 72 hours. Sepsis Labs:  Recent Labs Lab 12/28/15 0327 12/28/15 [id number] 12/28/15 0954 12/29/15 0541 12/29/15 1439 12/30/15 0233 12/31/15 0550  PROCALCITON 0.36  --   --   --   --   --   --   WBC  --   --  21.1* 11.1*  --  6.6 5.4  LATICACIDVEN 1.2 1.1 2.4*  --  1.2  --   --    Microbiology Recent Results (from the past 240 hour(s))  Blood Culture (routine x 2)     Status: None (Preliminary result)   Collection Time: 12/28/15  1:44 AM  Result Value Ref Range Status   Specimen Description BLOOD LEFT ARM  Final   Special Requests BOTTLES DRAWN AEROBIC AND ANAEROBIC 5ML  Final   Culture NO  GROWTH 2 DAYS  Final   Report Status PENDING  Incomplete  Blood Culture (routine x 2)     Status: None (Preliminary result)   Collection Time: 12/28/15  1:59 AM  Result Value Ref Range Status   Specimen Description BLOOD RIGHT FOREARM  Final   Special Requests BOTTLES DRAWN AEROBIC AND ANAEROBIC 5ML  Final   Culture NO GROWTH 2 DAYS  Final   Report Status PENDING  Incomplete  Urine culture     Status: None   Collection Time: 12/28/15  2:30 AM  Result Value Ref Range Status   Specimen Description URINE, CATHETERIZED  Final   Special Requests NONE  Final   Culture NO GROWTH 1 DAY  Final   Report Status 12/29/2015 FINAL  Final  MRSA PCR Screening     Status: None   Collection Time: 12/28/15  6:30 AM  Result Value Ref Range Status   MRSA by PCR NEGATIVE NEGATIVE Final    Comment:        The GeneXpert MRSA Assay (FDA approved for NASAL specimens only), is one component of a comprehensive MRSA colonization surveillance program. It is not intended to diagnose MRSA infection nor to guide or monitor treatment for MRSA infections.      Medications:   . antiseptic oral rinse  7 mL Mouth Rinse BID  . dextromethorphan-guaiFENesin  1 tablet Oral BID  . diltiazem  240 mg Oral Daily  . enoxaparin (LOVENOX) injection  40 mg Subcutaneous Q24H  . feeding supplement (ENSURE ENLIVE)  237 mL Oral BID BM  . finasteride  5 mg Oral Daily  . ipratropium-albuterol  3 mL Nebulization Q6H  . methylPREDNISolone (SOLU-MEDROL) injection  60 mg Intravenous Q24H  . mometasone-formoterol  2 puff Inhalation BID  . multivitamin with minerals  1 tablet Oral Daily  . pantoprazole  40 mg Oral Daily  . piperacillin-tazobactam (ZOSYN)  IV  3.375 g Intravenous Q8H  . polyethylene glycol  17 g Oral QHS  . rosuvastatin  10 mg Oral q1800  . sildenafil  20 mg Oral BID  . sodium chloride flush  3 mL Intravenous Q12H  . temazepam  30 mg Oral QHS   Continuous Infusions: . sodium chloride 10 mL/hr at 12/31/15  0800  . sodium chloride      Time spent: 25 min   LOS: 3 days   Charlynne Cousins  [facility] Pager [phone]  *Please refer to [email or web address], password [email or web address] to get updated schedule on who will round on this patient, as hospitalists switch teams weekly. If 7PM-7AM, please contact night-coverage at [email or web address], password [email or web address] for any overnight needs.  12/31/2015, 10:25 AM

## 2015-12-31 NOTE — Progress Notes (Signed)
Pharmacy Antibiotic Note  Andrew [name] is a 75 y.o. male admitted on 12/28/2015 with sepsis.  Pharmacy has been consulted for Zosyn dosing. WBC= 16.8>11.1, afebrile, LA= 2.53>>1.1>>2.4, PCT= 0.36  Plan: Continue Zosyn 3.375g IV Q8h (4 hr inf) F/U c/s, renal fxn, LOT  Height: 5\' 9"  (175.3 cm) Weight: 145 lb 15.1 oz (66.2 kg) IBW/kg (Calculated) : 70.7  Temp (24hrs), Avg:98 F (36.7 C), Min:97.8 F (36.6 C), Max:98.1 F (36.7 C)   Recent Labs Lab 12/28/15 0130 12/28/15 0204 12/28/15 0327 12/28/15 [id number] 12/28/15 0954 12/29/15 0541 12/29/15 1439 12/30/15 0233 12/31/15 0550  WBC 16.8*  --   --   --  21.1* 11.1*  --  6.6 5.4  CREATININE 1.05  --   --   --  0.95 0.81  --  0.75 0.73  LATICACIDVEN  --  2.53* 1.2 1.1 2.4*  --  1.2  --   --     Estimated Creatinine Clearance: 73.6 mL/min (by C-G formula based on Cr of 0.73).    No Known Allergies  Antimicrobials this admission: 2/9 zosyn>>  2/9 vanc>>2/10   Microbiology results: 2/9 urine>>ng2d  2/9 blood>>ng2d  Thank you for allowing pharmacy to be a part of this patient's care.  Conrad Westmorland 12/31/2015 9:17 AM

## 2015-12-31 NOTE — Progress Notes (Signed)
Pt only able to void small amounts of urine at a time. Pt bladder scanned with greater than 200 ml in bladder. Pt complaining of increasing pressure in his lower abdomen. Order received to reinsert foley catheter. Foley huddle preformed. Catheter was inserted with Page RN using sterile technique. Peri care was preformed before and after insertion. 850 ml of clear yellow urine returned with relief of pts abdominal pressure.

## 2016-01-01 LAB — CBC WITH DIFFERENTIAL/PLATELET
Basophils Absolute: 0 10*3/uL (ref 0.0–0.1)
Basophils Relative: 0 %
Eosinophils Absolute: 0 10*3/uL (ref 0.0–0.7)
Eosinophils Relative: 0 %
HCT: 34.2 % — ABNORMAL LOW (ref 39.0–52.0)
HEMOGLOBIN: 11.4 g/dL — AB (ref 13.0–17.0)
LYMPHS ABS: 0.3 10*3/uL — AB (ref 0.7–4.0)
LYMPHS PCT: 7 %
MCH: 31.5 pg (ref 26.0–34.0)
MCHC: 33.3 g/dL (ref 30.0–36.0)
MCV: 94.5 fL (ref 78.0–100.0)
MONOS PCT: 3 %
Monocytes Absolute: 0.2 10*3/uL (ref 0.1–1.0)
NEUTROS PCT: 90 %
Neutro Abs: 4.3 10*3/uL (ref 1.7–7.7)
Platelets: 201 10*3/uL (ref 150–400)
RBC: 3.62 MIL/uL — AB (ref 4.22–5.81)
RDW: 12.9 % (ref 11.5–15.5)
WBC: 4.8 10*3/uL (ref 4.0–10.5)

## 2016-01-01 LAB — COMPREHENSIVE METABOLIC PANEL
ALT: 51 U/L (ref 17–63)
AST: 15 U/L (ref 15–41)
Albumin: 2.7 g/dL — ABNORMAL LOW (ref 3.5–5.0)
Alkaline Phosphatase: 47 U/L (ref 38–126)
Anion gap: 11 (ref 5–15)
BUN: 15 mg/dL (ref 6–20)
CHLORIDE: 104 mmol/L (ref 101–111)
CO2: 25 mmol/L (ref 22–32)
CREATININE: 0.76 mg/dL (ref 0.61–1.24)
Calcium: 8.7 mg/dL — ABNORMAL LOW (ref 8.9–10.3)
GFR calc Af Amer: >60 mL/min (ref >60)
Glucose, Bld: 146 mg/dL — ABNORMAL HIGH (ref 65–99)
POTASSIUM: 3.9 mmol/L (ref 3.5–5.1)
SODIUM: 140 mmol/L (ref 135–145)
Total Bilirubin: 0.4 mg/dL (ref 0.3–1.2)
Total Protein: 5.3 g/dL — ABNORMAL LOW (ref 6.5–8.1)

## 2016-01-01 LAB — MAGNESIUM: Magnesium: 2 mg/dL (ref 1.7–2.4)

## 2016-01-01 MED ORDER — IPRATROPIUM-ALBUTEROL 0.5-2.5 (3) MG/3ML IN SOLN
3.0000 mL | Freq: Four times a day (QID) | RESPIRATORY_TRACT | Status: DC
  Administered 2016-01-01 – 2016-01-03 (×7): 3 mL via RESPIRATORY_TRACT
  Filled 2016-01-01 (×7): qty 3

## 2016-01-01 NOTE — Evaluation (Signed)
Physical Therapy Evaluation Patient Details Name: Andrew [name] MRN: [id number] DOB: 01/14/1941 Today's Date: 01/01/2016   History of Present Illness  75 y.o. male with a PMH of COPD, PAH, chronic respiratory failure on 2 L of home oxygen in the day and 3 L at bedtime who was admitted 12/28/15 with a chief complaint of progressive cough that started with a sinus infection 2 weeks prior to presentation, the recent onset of nausea and vomiting, and worsening respiratory status. Work up revealed Sepsis with suspects community acquired pneumonia.  Clinical Impression  Patient demonstrates deficits in functional mobility and activity tolerance as indicated below. Will benefit from continued skilled PT to address deficits and maximize function. Will see as indicated and progress as tolerated. Patient receptive to HHPT to aide functional recovery and safety with mobility upon discharge.  OF NOTE: BP prior to activity 150s/70s, after activity 170/85.  HR at rest 90s, elevated to 130s with activity, non-sustained intermittent Vtac on monitor, asymptomic (question artifact, nsg aware).  O2 saturations on 2 liters >92%  98% at rest    Follow Up Recommendations Home health PT;Supervision - Intermittent    Equipment Recommendations  None recommended by PT    Recommendations for Other Services       Precautions / Restrictions Precautions Precautions:  (Oxygen needs) Restrictions Weight Bearing Restrictions: No      Mobility  Bed Mobility Overal bed mobility: Modified Independent             General bed mobility comments: increased time to perform. no physical assist needed  Transfers Overall transfer level: Needs assistance Equipment used: None Transfers: Sit to/from Stand Sit to Stand: Min guard         General transfer comment: min guard for stability and line management. no physical assist required.  Ambulation/Gait Ambulation/Gait assistance: Min assist Ambulation Distance  (Feet): 40 Feet Assistive device:  (pushing IV pole) Gait Pattern/deviations: Step-through pattern;Decreased stride length;Narrow base of support Gait velocity: decreased Gait velocity interpretation: Below normal speed for age/gender General Gait Details: generalized fatigue and limited activity tolerance with ambulation on 2 liters in room. Elevated HR 130s, but came down to 100s with short rest.  Stairs            Wheelchair Mobility    Modified Rankin (Stroke Patients Only)       Balance Overall balance assessment: No apparent balance deficits (not formally assessed)                                           Pertinent Vitals/Pain Pain Assessment: No/denies pain    Home Living Family/patient expects to be discharged to:: Private residence Living Arrangements: Children Available Help at Discharge: Family Type of Home: House Home Access: Level entry     Home Layout: One level Home Equipment: None      Prior Function Level of Independence: Independent               Hand Dominance   Dominant Hand: Right    Extremity/Trunk Assessment   Upper Extremity Assessment: Overall WFL for tasks assessed           Lower Extremity Assessment: Overall WFL for tasks assessed         Communication   Communication: No difficulties  Cognition Arousal/Alertness: Awake/alert Behavior During Therapy: WFL for tasks assessed/performed Overall Cognitive Status: Within Functional Limits for tasks assessed  General Comments General comments (skin integrity, edema, etc.): spoke with patient at length regarding activity tolerance and home management. Patient with concerns regarding level of fatigue. discussed potential benefits of home PT services.     Exercises        Assessment/Plan    PT Assessment Patient needs continued PT services  PT Diagnosis Difficulty walking   PT Problem List Decreased  strength;Decreased activity tolerance;Decreased balance;Decreased mobility;Cardiopulmonary status limiting activity  PT Treatment Interventions Gait training;Functional mobility training;Therapeutic activities;Balance training;Therapeutic exercise;Patient/family education   PT Goals (Current goals can be found in the Care Plan section) Acute Rehab PT Goals Patient Stated Goal: to get back to normal PT Goal Formulation: With patient Time For Goal Achievement: 01/15/16 Potential to Achieve Goals: Good    Frequency Min 3X/week   Barriers to discharge        Co-evaluation               End of Session Equipment Utilized During Treatment: Gait belt;Oxygen Activity Tolerance: Patient limited by fatigue Patient left: in bed;with call bell/phone within reach Nurse Communication: Mobility status         Time: [id number] PT Time Calculation (min) (ACUTE ONLY): 22 min   Charges:   PT Evaluation $PT Eval Moderate Complexity: 1 Procedure     PT G CodesDuncan Dull 01-14-16, 1:59 PM Alben Deeds, [place] DPT  [phone]

## 2016-01-01 NOTE — Progress Notes (Signed)
[place] TEAM 1 - Stepdown/ICU TEAM PROGRESS NOTE  Andrew [name] [id number] DOB: 12-May-1941 DOA: 12/28/2015 PCP: Lujean Amel, MD  Admit HPI / Brief Narrative: 74 y.o. male with a hx of COPD, PAH, chronic respiratory failure on 2 L of home oxygen in the day and 3 L at bedtime who was admitted 12/28/15 with a chief complaint of progressive cough that started with a "sinus infection" 2 weeks prior to presentation. Upon arrival to the ED, the patient was noted to be febrile (103.36F), tachycardic in the 140s, and with increased oxygen requirement and a WBC of 16.5.  HPI/Subjective: The patient states he feels very weak in general.  Attempts to discontinue his Foley yesterday failed.  He denies chest pain shortness of breath fevers chills nausea or vomiting.  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 Lenna Gilford  Hyperglycemia  Most likely simply due to steroid dosing - check A1c  Acute urinary retention Foley re-placed - continue finasteride and flomax - attempt to remove foley again in AM   HTN BP currently reasonably controlled - follow   Code Status: DNR / NO CODE  Family Communication: no family present at time of exam Disposition Plan: transfer to med bed - ambulate - d/c foley in AM   Consultants: none  Procedures: none  Antibiotics: Zosyn 2/8 > 2/12 Vancomycin 2/8 > 2/9 Levaquin 2/12 >  DVT prophylaxis: lovenox   Objective: Blood pressure 154/89, pulse 81, temperature 98.3 F (36.8 C),  temperature source Oral, resp. rate 18, height 5\' 9"  (1.753 m), weight 66.2 kg (145 lb 15.1 oz), SpO2 96 %.  Intake/Output Summary (Last 24 hours) at 01/01/16 1508 Last data filed at 01/01/16 1300  Gross per 24 hour  Intake   1420 ml  Output   1800 ml  Net   -380 ml   Exam: General: No acute respiratory distress in bed  Lungs: Distant breath sounds in all fields  - mild right midfield crackles  - no wheeze  Cardiovascular: Regular rate and rhythm without murmur gallop or rub - distant heart sounds  Abdomen: Nontender, nondistended, soft, bowel sounds positive, no rebound, no ascites, no appreciable mass Extremities: No significant cyanosis, clubbing, or edema bilateral lower extremities  Data Reviewed:  Basic Metabolic Panel:  Recent Labs Lab 12/28/15 0954 12/29/15 0541 12/30/15 0233 12/31/15 0550 01/01/16 0251  NA 141 140 141 140 140  K 4.0 3.9 3.4* 3.9 3.9  CL 109 110 109 107 104  CO2 23 23 24 23 25   GLUCOSE 150* 133* 166* 152* 146*  BUN 10 9 10 12 15   CREATININE 0.95 0.81 0.75 0.73 0.76  CALCIUM 8.4* 8.3* 8.4* 8.6* 8.7*  MG 1.8 1.8 1.8 1.9 2.0    CBC:  Recent Labs Lab 12/28/15 0954 12/29/15 0541 12/30/15 0233 12/31/15 0550 01/01/16 0251  WBC 21.1* 11.1* 6.6 5.4 4.8  NEUTROABS 19.2* 10.5* 6.1 4.8 4.3  HGB 11.9* 11.0* 10.7* 11.1* 11.4*  HCT 35.8* 31.6* 32.5* 33.6* 34.2*  MCV 95.7 95.5 95.3 94.4 94.5  PLT 188 181 166 171 201    Liver Function Tests:  Recent Labs Lab 12/28/15 0954 12/29/15 0541 12/30/15 0233 12/31/15 0550 01/01/16 0251  AST 22 15 20 20 15   ALT 22 21 37 51 51  ALKPHOS 56 49 48 46 47  BILITOT 0.5 0.5 0.4 0.3 0.4  PROT 5.8* 5.7* 5.1* 5.3* 5.3*  ALBUMIN 3.0* 2.7* 2.7* 2.8* 2.7*    Recent Results (from the past 240 hour(s))  Blood Culture (routine x 2)     Status: None (Preliminary result)   Collection Time: 12/28/15  1:44 AM  Result Value Ref Range Status   Specimen Description BLOOD LEFT ARM  Final   Special Requests BOTTLES  DRAWN AEROBIC AND ANAEROBIC 5ML  Final   Culture NO GROWTH 4 DAYS  Final   Report Status PENDING  Incomplete  Blood Culture (routine x 2)     Status: None (Preliminary result)   Collection Time: 12/28/15  1:59 AM  Result Value Ref Range Status   Specimen Description BLOOD RIGHT FOREARM  Final   Special Requests BOTTLES DRAWN AEROBIC AND ANAEROBIC 5ML  Final   Culture NO GROWTH 4 DAYS  Final   Report Status PENDING  Incomplete  Urine culture     Status: None   Collection Time: 12/28/15  2:30 AM  Result Value Ref Range Status   Specimen Description URINE, CATHETERIZED  Final   Special Requests NONE  Final   Culture NO GROWTH 1 DAY  Final   Report Status 12/29/2015 FINAL  Final  MRSA PCR Screening     Status: None   Collection Time: 12/28/15  6:30 AM  Result Value Ref Range Status   MRSA by PCR NEGATIVE NEGATIVE Final    Comment:        The GeneXpert MRSA Assay (FDA approved for NASAL specimens only), is one component of a comprehensive MRSA colonization surveillance program. It is not intended to diagnose MRSA infection nor to guide or monitor treatment for MRSA infections.      Studies:   Recent x-ray studies have been reviewed in detail by the Attending Physician  Scheduled Meds:  Scheduled Meds: . antiseptic oral rinse  7 mL Mouth Rinse BID  . dextromethorphan-guaiFENesin  1 tablet Oral BID  . diltiazem  240 mg Oral Daily  . enoxaparin (LOVENOX) injection  40 mg Subcutaneous Q24H  . feeding supplement (ENSURE ENLIVE)  237 mL Oral BID BM  . finasteride  5 mg Oral Daily  . ipratropium-albuterol  3 mL Nebulization Q6H  . levofloxacin (LEVAQUIN) IV  500 mg Intravenous Q24H  . methylPREDNISolone (SOLU-MEDROL) injection  60 mg Intravenous Q24H  . mometasone-formoterol  2 puff Inhalation BID  . multivitamin with minerals  1 tablet Oral Daily  . pantoprazole  40 mg Oral Daily  . polyethylene glycol  17 g Oral QHS  . rosuvastatin  10 mg Oral q1800  . sildenafil  20 mg  Oral BID  . sodium chloride flush  3 mL Intravenous Q12H  . tamsulosin  0.4 mg Oral QPC breakfast  . temazepam  30 mg Oral QHS    Time spent on care of this patient: 35 mins   MCCLUNG,JEFFREY T , MD   [facility] Office  [phone] Pager - Text Page per Shea Evans as per below:  On-Call/Text Page:      [email or web address]      password [email or web address]  If 7PM-7AM, please contact night-coverage [email or web address] Password [email or web address] 01/01/2016, 3:08 PM   LOS: 4 days

## 2016-01-01 NOTE — Care Management Important Message (Signed)
Important Message  Patient Details  Name: Andrew [name] MRN: [id number] Date of Birth: 1941/08/12   Medicare Important Message Given:  Yes    Lacretia Leigh, RN 01/01/2016, 12:06 PM

## 2016-01-02 DIAGNOSIS — J189 Pneumonia, unspecified organism: Secondary | ICD-10-CM | POA: Insufficient documentation

## 2016-01-02 LAB — CBC
HCT: 34.3 % — ABNORMAL LOW (ref 39.0–52.0)
Hemoglobin: 11.6 g/dL — ABNORMAL LOW (ref 13.0–17.0)
MCH: 31.9 pg (ref 26.0–34.0)
MCHC: 33.8 g/dL (ref 30.0–36.0)
MCV: 94.2 fL (ref 78.0–100.0)
PLATELETS: 221 10*3/uL (ref 150–400)
RBC: 3.64 MIL/uL — AB (ref 4.22–5.81)
RDW: 12.7 % (ref 11.5–15.5)
WBC: 4.9 10*3/uL (ref 4.0–10.5)

## 2016-01-02 LAB — RESPIRATORY VIRUS PANEL
ADENOVIRUS: NEGATIVE
INFLUENZA B 1: NEGATIVE
Influenza A: NEGATIVE
METAPNEUMOVIRUS: NEGATIVE
PARAINFLUENZA 1 A: NEGATIVE
PARAINFLUENZA 2 A: NEGATIVE
Parainfluenza 3: NEGATIVE
Respiratory Syncytial Virus A: NEGATIVE
Respiratory Syncytial Virus B: NEGATIVE
Rhinovirus: NEGATIVE

## 2016-01-02 LAB — CULTURE, BLOOD (ROUTINE X 2)
Culture: NO GROWTH
Culture: NO GROWTH

## 2016-01-02 LAB — BASIC METABOLIC PANEL
Anion gap: 12 (ref 5–15)
BUN: 16 mg/dL (ref 6–20)
CO2: 26 mmol/L (ref 22–32)
CREATININE: 0.72 mg/dL (ref 0.61–1.24)
Calcium: 8.9 mg/dL (ref 8.9–10.3)
Chloride: 101 mmol/L (ref 101–111)
GFR calc Af Amer: >60 mL/min (ref >60)
GLUCOSE: 145 mg/dL — AB (ref 65–99)
POTASSIUM: 3.9 mmol/L (ref 3.5–5.1)
Sodium: 139 mmol/L (ref 135–145)

## 2016-01-02 LAB — BRAIN NATRIURETIC PEPTIDE: B Natriuretic Peptide: 60 pg/mL (ref 0.0–100.0)

## 2016-01-02 MED ORDER — LACTULOSE 10 GM/15ML PO SOLN
20.0000 g | ORAL | Status: AC
  Administered 2016-01-02: 20 g via ORAL
  Filled 2016-01-02: qty 30

## 2016-01-02 NOTE — Progress Notes (Signed)
Physical Therapy Treatment Patient Details Name: Andrew [name] MRN: [id number] DOB: 10-18-1941 Today's Date: 01/02/2016    History of Present Illness 75 y.o. male with a PMH of COPD, PAH, chronic respiratory failure on 2 L of home oxygen in the day and 3 L at bedtime who was admitted 12/28/15 with a chief complaint of progressive cough that started with a sinus infection 2 weeks prior to presentation, the recent onset of nausea and vomiting, and worsening respiratory status. Work up revealed Sepsis with suspects community acquired pneumonia.    PT Comments    Patient seen for mobility progression. Significantly limited with minimal ambulation. Ambulated on 4 liters  with desaturation to 86% and HR elevating to upper 160s sustained and lips becoming discolored with labored breaths. Patient stopped for extended rest break at 32ft. And then returned to room. Patient very DOE.  At this time, patient is not maintaining saturations on 4 liters (usually uses 2 at baseline), may need to consider increased O2 for activity. Will continue to see and progress as tolerated.  Follow Up Recommendations  Home health PT;Supervision - Intermittent     Equipment Recommendations  None recommended by PT    Recommendations for Other Services       Precautions / Restrictions Restrictions Weight Bearing Restrictions: No    Mobility  Bed Mobility Overal bed mobility: Modified Independent             General bed mobility comments: increased time to perform. no physical assist needed  Transfers Overall transfer level: Needs assistance Equipment used: None Transfers: Sit to/from Stand Sit to Stand: Min guard         General transfer comment: min guard for stability and line management. no physical assist required.  Ambulation/Gait Ambulation/Gait assistance: Min guard Ambulation Distance (Feet): 120 Feet Assistive device:  (holding on to railings) Gait Pattern/deviations: Step-through  pattern;Decreased stride length;Narrow base of support Gait velocity: decreased Gait velocity interpretation: Below normal speed for age/gender General Gait Details: ambulated on 4 liters, significant DOE, HR elevated upper 160s sustained, increased rest time required at 60 ft to calm HR. Some modest instability noted   Stairs            Wheelchair Mobility    Modified Rankin (Stroke Patients Only)       Balance                                    Cognition Arousal/Alertness: Awake/alert Behavior During Therapy: WFL for tasks assessed/performed Overall Cognitive Status: Within Functional Limits for tasks assessed                      Exercises      General Comments        Pertinent Vitals/Pain Pain Assessment: No/denies pain    Home Living                      Prior Function            PT Goals (current goals can now be found in the care plan section) Acute Rehab PT Goals Patient Stated Goal: to get back to normal PT Goal Formulation: With patient Time For Goal Achievement: 01/15/16 Potential to Achieve Goals: Good Progress towards PT goals: Progressing toward goals    Frequency  Min 3X/week    PT Plan Current plan remains appropriate    Co-evaluation  End of Session Equipment Utilized During Treatment: Gait belt;Oxygen Activity Tolerance: Patient limited by fatigue;Treatment limited secondary to medical complications (Comment) (HR to 160s, O2 dropping to 86% on 4 liters with ambulation) Patient left: in bed;with call bell/phone within reach (sitting EOB)     Time: [id number] PT Time Calculation (min) (ACUTE ONLY): 18 min  Charges:  $Gait Training: 8-22 mins                    G CodesDuncan Dull 2016-01-26, 11:25 AM Alben Deeds, PT DPT  [phone]

## 2016-01-02 NOTE — Progress Notes (Signed)
PROGRESS NOTE  Andrew [name] [id number] DOB: 24-May-1941 DOA: 12/28/2015 PCP: Lujean Amel, MD  HPI/Recap of past 25 hours: 75 year old male with past history of COPD, BPH, pulmonary hypertension and chronic respiratory failure with a baseline of 2 L oxygen daytime/3 L daily at bedtime admitted on 2/9 after 2 weeks of progressive cough with sinus infection. Patient found to be septic felt to be secondary to pneumonia as well as had worsening respiratory failure.  Patient treated for COPD exacerbation with IV steroids, nebulizers, IV antibiotics and has made some improvements. Hospital course complicated by some acute urinary retention requiring Foley replacement.  Initially oxygen able to be weaned down to baseline as of 2/13  Today patient doing okay. Ambulate with PT which led to him feeling very short of breath and found to be more hypoxic acquiring increase in O2 up to 4 L. For catheter re--removed this morning and waiting to see patient will void. He himself otherwise has no complaints other than some abdominal distention and feeling gassy. He is only had some very small bowel movements in the last few days when normally he goes twice a day. No other complaints.  Assessment/Plan: Principal Problem:   Sepsis ([employer]) secondary to community-acquired pneumonia: Patient met criteria on admission given tachypnea, tachycardia, leukocytosis and lactic acidosis with pulmonary source. With IV fluids and IV antibiotics, patient has significantly improved and sepsis resolved. Antibiotics D escalated to Levaquin. Active Problems:  Acute on chronic respiratory failure with hypoxia secondary to COPD exacerbation with emphysema/pulmonary hypertension and acute community-acquired pneumonia: Tapering down Solu-Medrol, continue oxygen, continue nebulizers and antibiotics. Patient not yet at baseline. Checking BNP    Acute urinary retention: For catheter removed again. Awaiting voiding. If he is not able to  void, we'll replace Foley catheter and have patient follow up with urology as outpatient. Continue BPH meds. Patient with history of BPH  Code Status: Full code   Family Communication: left message with family  Disposition Plan:   anticipate discharge hopefully in next 1-2 days once oxygenation at baseline    Consultants None  Procedures:  None  Antibiotics:  Zosyn 2/8-2/12  Vancomycin 2/80-2/9  Levaquin 2/12-present    Objective: BP 152/78 mmHg  Pulse 97  Temp(Src) 97.4 F (36.3 C) (Oral)  Resp 17  Ht _0  (1.753 m)  Wt 66.2 kg (145 lb 15.1 oz)  BMI 21.54 kg/m2  SpO2 94%  Intake/Output Summary (Last 24 hours) at 01/02/16 1352 Last data filed at 01/02/16 0900  Gross per 24 hour  Intake    220 ml  Output   2675 ml  Net  -2455 ml   Filed Weights   12/28/15 0300 12/28/15 0630  Weight: 68 kg (149 lb 14.6 oz) 66.2 kg (145 lb 15.1 oz)    Exam:   General:  alert and oriented 3    Cardiovascular: Regular rate and rhythm, S1-S2    Respiratory: decreased breath sounds throughout, no crackles or wheezes    Abdomen: Soft, nontender, nondistended, positive bowel sounds   Musculoskeletal: No clubbing or cyanosis or edema     Data Reviewed: Basic Metabolic Panel:  Recent Labs Lab 12/28/15 0954 12/29/15 0541 12/30/15 0233 12/31/15 0550 01/01/16 0251 01/02/16 0229  NA 141 140 141 140 140 139  K 4.0 3.9 3.4* 3.9 3.9 3.9  CL 109 110 109 107 104 101  CO2 _1 GLUCOSE 150* 133* 166* 152* 146* 145*  BUN _2 CREATININE  0.95 0.81 0.75 0.73 0.76 0.72  CALCIUM 8.4* 8.3* 8.4* 8.6* 8.7* 8.9  MG 1.8 1.8 1.8 1.9 2.0  --    Liver Function Tests:  Recent Labs Lab 12/28/15 0954 12/29/15 0541 12/30/15 0233 12/31/15 0550 01/01/16 0251  AST _0 ALT 22 21 37 51 51  ALKPHOS 56 49 48 46 47  BILITOT 0.5 0.5 0.4 0.3 0.4  PROT 5.8* 5.7* 5.1* 5.3* 5.3*  ALBUMIN 3.0* 2.7* 2.7* 2.8* 2.7*   No results for input(s): LIPASE,  AMYLASE in the last 168 hours. No results for input(s): AMMONIA in the last 168 hours. CBC:  Recent Labs Lab 12/28/15 0954 12/29/15 0541 12/30/15 0233 12/31/15 0550 01/01/16 0251 01/02/16 0229  WBC 21.1* 11.1* 6.6 5.4 4.8 4.9  NEUTROABS 19.2* 10.5* 6.1 4.8 4.3  --   HGB 11.9* 11.0* 10.7* 11.1* 11.4* 11.6*  HCT 35.8* 31.6* 32.5* 33.6* 34.2* 34.3*  MCV 95.7 95.5 95.3 94.4 94.5 94.2  PLT 188 181 166 171 201 221   Cardiac Enzymes:   No results for input(s): CKTOTAL, CKMB, CKMBINDEX, TROPONINI in the last 168 hours. BNP (last 3 results) No results for input(s): BNP in the last 8760 hours.  ProBNP (last 3 results) No results for input(s): PROBNP in the last 8760 hours.  CBG: No results for input(s): GLUCAP in the last 168 hours.  Recent Results (from the past 240 hour(s))  Blood Culture (routine x 2)     Status: None (Preliminary result)   Collection Time: 12/28/15  1:44 AM  Result Value Ref Range Status   Specimen Description BLOOD LEFT ARM  Final   Special Requests BOTTLES DRAWN AEROBIC AND ANAEROBIC 5ML  Final   Culture NO GROWTH 4 DAYS  Final   Report Status PENDING  Incomplete  Blood Culture (routine x 2)     Status: None (Preliminary result)   Collection Time: 12/28/15  1:59 AM  Result Value Ref Range Status   Specimen Description BLOOD RIGHT FOREARM  Final   Special Requests BOTTLES DRAWN AEROBIC AND ANAEROBIC 5ML  Final   Culture NO GROWTH 4 DAYS  Final   Report Status PENDING  Incomplete  Urine culture     Status: None   Collection Time: 12/28/15  2:30 AM  Result Value Ref Range Status   Specimen Description URINE, CATHETERIZED  Final   Special Requests NONE  Final   Culture NO GROWTH 1 DAY  Final   Report Status 12/29/2015 FINAL  Final  MRSA PCR Screening     Status: None   Collection Time: 12/28/15  6:30 AM  Result Value Ref Range Status   MRSA by PCR NEGATIVE NEGATIVE Final    Comment:        The GeneXpert MRSA Assay (FDA approved for NASAL  specimens only), is one component of a comprehensive MRSA colonization surveillance program. It is not intended to diagnose MRSA infection nor to guide or monitor treatment for MRSA infections.   Respiratory virus panel     Status: None   Collection Time: 12/28/15  2:00 PM  Result Value Ref Range Status   Respiratory Syncytial Virus A Negative Negative Final   Respiratory Syncytial Virus B Negative Negative Final   Influenza A Negative Negative Final   Influenza B Negative Negative Final   Parainfluenza 1 Negative Negative Final   Parainfluenza 2 Negative Negative Final   Parainfluenza 3 Negative Negative Final   Metapneumovirus Negative Negative Final   Rhinovirus Negative Negative Final  Adenovirus Negative Negative Final    Comment: (NOTE) Performed At: [facility] [place], [place] [id number] Lindon Romp MD PH:[phone]      Studies: No results found.  Scheduled Meds: . antiseptic oral rinse  7 mL Mouth Rinse BID  . dextromethorphan-guaiFENesin  1 tablet Oral BID  . diltiazem  240 mg Oral Daily  . enoxaparin (LOVENOX) injection  40 mg Subcutaneous Q24H  . feeding supplement (ENSURE ENLIVE)  237 mL Oral BID BM  . finasteride  5 mg Oral Daily  . ipratropium-albuterol  3 mL Nebulization QID  . lactulose  20 g Oral NOW  . levofloxacin (LEVAQUIN) IV  500 mg Intravenous Q24H  . methylPREDNISolone (SOLU-MEDROL) injection  60 mg Intravenous Q24H  . mometasone-formoterol  2 puff Inhalation BID  . multivitamin with minerals  1 tablet Oral Daily  . pantoprazole  40 mg Oral Daily  . polyethylene glycol  17 g Oral QHS  . rosuvastatin  10 mg Oral q1800  . sildenafil  20 mg Oral BID  . sodium chloride flush  3 mL Intravenous Q12H  . tamsulosin  0.4 mg Oral QPC breakfast  . temazepam  30 mg Oral QHS    Continuous Infusions:    Time 25 minutes   [facility] Pager [phone]  . If 7PM-7AM, please contact  night-coverage at [email or web address], password [facility] 01/02/2016, 1:52 PM  LOS: 5 days

## 2016-01-03 DIAGNOSIS — R338 Other retention of urine: Secondary | ICD-10-CM

## 2016-01-03 DIAGNOSIS — A419 Sepsis, unspecified organism: Principal | ICD-10-CM

## 2016-01-03 DIAGNOSIS — J189 Pneumonia, unspecified organism: Secondary | ICD-10-CM

## 2016-01-03 DIAGNOSIS — J438 Other emphysema: Secondary | ICD-10-CM

## 2016-01-03 LAB — HEMOGLOBIN A1C
HEMOGLOBIN A1C: 6.2 % — AB (ref 4.8–5.6)
Mean Plasma Glucose: 131 mg/dL

## 2016-01-03 MED ORDER — DM-GUAIFENESIN ER 30-600 MG PO TB12: 1.0000 | ORAL_TABLET | Freq: Two times a day (BID) | ORAL | Status: DC

## 2016-01-03 MED ORDER — PREDNISONE 20 MG PO TABS
40.0000 mg | ORAL_TABLET | Freq: Every day | ORAL | Status: DC
  Administered 2016-01-03: 40 mg via ORAL
  Filled 2016-01-03: qty 2

## 2016-01-03 MED ORDER — ALBUTEROL SULFATE HFA 108 (90 BASE) MCG/ACT IN AERS: 2.0000 | INHALATION_SPRAY | Freq: Four times a day (QID) | RESPIRATORY_TRACT | Status: DC | PRN

## 2016-01-03 MED ORDER — TAMSULOSIN HCL 0.4 MG PO CAPS: 0.4000 mg | ORAL_CAPSULE | Freq: Every day | ORAL | Status: DC

## 2016-01-03 MED ORDER — SALINE SPRAY 0.65 % NA SOLN
1.0000 | NASAL | Status: DC | PRN
  Administered 2016-01-03: 1 via NASAL
  Filled 2016-01-03: qty 44

## 2016-01-03 MED ORDER — PREDNISONE 20 MG PO TABS: 40.0000 mg | ORAL_TABLET | Freq: Every day | ORAL | Status: DC

## 2016-01-03 MED ORDER — TAMSULOSIN HCL 0.4 MG PO CAPS: 0.4000 mg | ORAL_CAPSULE | Freq: Every day | ORAL | Status: AC

## 2016-01-03 NOTE — Care Management Note (Signed)
Case Management Note  Patient Details  Name: Andrew [name] MRN: [id number] Date of Birth: 09-10-1941  Subjective/Objective:                    Action/Plan: Confirmed face sheet information with patient .   Patient receives home oxygen through [facility] [id number], flow increasing to 4 L continuous .   Patient has portable oxygen  concentrator in hospital room he can use on way home.  Called Amanda at [facility] , made aware of increase in liter flow . Estill Bamberg has assess to [facility] . She requests patient to call [employer] when he arrives home and [employer] will pick up concentrator and provide tanks . Patient aware and [employer] number given to him. He states he will call.  Expected Discharge Date:                  Expected Discharge Plan:  [place]  In-House Referral:     Discharge planning Services  CM Consult  Post Acute Care Choice:  Durable Medical Equipment, Home Health Choice offered to:  Patient  DME Arranged:  Oxygen DME Agency:  [facility]  HH Arranged:  PT, OT, Nurse's Aide [place] Agency:  [facility]  Status of Service:  Completed, signed off  Medicare Important Message Given:  Yes Date Medicare IM Given:    Medicare IM give by:    Date Additional Medicare IM Given:    Additional Medicare Important Message give by:     If discussed at [place] of Stay Meetings, dates discussed:    Additional Comments:  Marilu Favre, RN 01/03/2016, 11:19 AM

## 2016-01-03 NOTE — Progress Notes (Signed)
Discharge instructions reviewed with patient. Questions answered re: medications and new prescriptions. M.D. Notified of patient's preference for pharmacy. Printed AVS given to patient. Patient discharged to home on oxygen via wheelchair.

## 2016-01-03 NOTE — Discharge Summary (Signed)
Physician Discharge Summary  Andrew [name] PNT:[id number] DOB: 02/09/1941 DOA: 12/28/2015  PCP: Lujean Amel, MD  Admit date: 12/28/2015 Discharge date: 01/03/2016  Time spent: 50 minutes  Recommendations for Outpatient Follow-up:  1. Needs screening CXR in 1 mo denote clearing 2. Might benefit from repeat screening for diabetes mellitus-was on steroids during this admission 3. Repeat CBC as well as Chem-7 in about one week 4. Should follow-up with Dr. Lenna Gilford of pulmonology in about 2-3 weeks 5. Medications on discharge include albuterol inhaler, prednisone burst of 40 mg for 5 days, Flomax.  Completed 7 days of antibiotics in hospital. 6. Will temporarily need 4 L of oxygen up from baseline of 2 L of oxygen and this can be rechecked as an outpatient  Discharge Diagnoses:  Principal Problem:   Sepsis ([place]) Active Problems:   COPD with emphysema ([place])   History of pulmonary hypertension   Acute on chronic respiratory failure with hypoxia ([place])   Acute urinary retention   CAP (community acquired pneumonia)   Discharge Condition: Improved  Diet recommendation: Heart healthy low-salt  Filed Weights   12/28/15 0300 12/28/15 0630  Weight: 68 kg (149 lb 14.6 oz) 66.2 kg (145 lb 39.35 oz)   75 year old male  Recently moved from [place] November 2016 known COPD/emphysema s/p right thoracotomy and blebectomy-on chronic oxygen 2 L.min, 3 L at night Pulmonary hypertension-on Revatio 20 3 times a day CAD? Left /right heart cath 1 blockage Smoker 1 pack per day and quit in [place] to stepdown unit 12/28/15 fever--had a sinus infection 2 weeks prior to admission Found to have sepsis secondary to community-acquired pneumonia, also developed urinary retention Initially febrile 103.2, tachycardic 140s oxygen requirement increased and WBC 16  Patient met criteria of tachycardia 10 A leukocytosis for sepsis and improved and resolved. His acute on chronic respiratory failure (pulmonary  hypertension, emphysema, COPD) also did resolve although he still required slightly higher oxygen levels of 4 L on discharge Acute urinary retention resolved and he was voiding reasonably well.  Patient was stabilized and physical therapy recommended home health therapy on discharge Should follow with his primary physician as an outpatient  -Would recommend diabetes mellitus screening as an outpatient   Discharge Exam: Filed Vitals:   01/03/16 0625 01/03/16 1026  BP: 150/75 154/74  Pulse: 71 87  Temp: 97.3 F (36.3 C)   Resp: 17     General: EOMI NCAT Cardiovascular: S1-S2 no murmur rub or gallop clinically clear otherwise Respiratory: No wheeze, no rales no rhonchi  Discharge Instructions   Discharge Instructions    Diet - low sodium heart healthy    Complete by:  As directed      Discharge instructions    Complete by:  As directed   Patient should complete 5 days more of prednisone 40 mg daily in an effort to decrease shortness of breath wheezing We have added on this admission Flomax to finasteride as he had some urinary retention which is now resolved New prescription of albuterol will be prescribed to be taken every 4 hours as needed for wheezing or shortness of breath No other significant changes to medications Please follow-up with the primary physician as you may need next ray in about one month and you should have some formalized testing for diabetes at some point [U on steroids and this can raise the blood sugar artificially and cause you to appear to have diabetes and this may need to be retested]     Increase activity slowly  Complete by:  As directed           Current Discharge Medication List    START taking these medications   Details  albuterol (PROVENTIL HFA;VENTOLIN HFA) 108 (90 Base) MCG/ACT inhaler Inhale 2 puffs into the lungs every 6 (six) hours as needed for wheezing or shortness of breath. Qty: 1 Inhaler, Refills: 2     dextromethorphan-guaiFENesin (MUCINEX DM) 30-600 MG 12hr tablet Take 1 tablet by mouth 2 (two) times daily. Qty: 60 tablet, Refills: 0    predniSONE (DELTASONE) 20 MG tablet Take 2 tablets (40 mg total) by mouth daily before breakfast. Qty: 8 tablet, Refills: 0    tamsulosin (FLOMAX) 0.4 MG CAPS capsule Take 1 capsule (0.4 mg total) by mouth daily after breakfast. Qty: 30 capsule, Refills: 0      CONTINUE these medications which have NOT CHANGED   Details  ADVAIR DISKUS 250-50 MCG/DOSE AEPB Inhale 1 puff into the lungs 2 (two) times daily. Qty: 60 each, Refills: 2   Associated Diagnoses: Pulmonary hypertension ([place]); Other emphysema ([place]); Chronic hypoxemic respiratory failure ([place]); History of pulmonary hypertension; Dyspnea    CALCIUM PO Take 1 tablet by mouth daily.    diltiazem (CARDIZEM LA) 120 MG 24 hr tablet Take 240 mg by mouth daily.   Associated Diagnoses: Pulmonary hypertension ([place]); Other emphysema ([place]); Chronic hypoxemic respiratory failure ([place]); History of pulmonary hypertension; Dyspnea    esomeprazole (NEXIUM) 20 MG capsule Take 20 mg by mouth daily at 12 noon.   Associated Diagnoses: Pulmonary hypertension ([place]); Other emphysema ([place]); Chronic hypoxemic respiratory failure ([place]); History of pulmonary hypertension; Dyspnea    finasteride (PROSCAR) 5 MG tablet Take 5 mg by mouth daily.    Associated Diagnoses: Pulmonary hypertension ([place]); Other emphysema ([place]); Chronic hypoxemic respiratory failure ([place]); History of pulmonary hypertension; Dyspnea    Multiple Vitamins-Minerals (MULTIVITAMIN WITH MINERALS) tablet Take 1 tablet by mouth daily.    polyethylene glycol (MIRALAX / GLYCOLAX) packet Take 17 g by mouth at bedtime.    rosuvastatin (CRESTOR) 10 MG tablet Take 10 mg by mouth daily.   Associated Diagnoses: Pulmonary hypertension ([place]); Other emphysema ([place]); Chronic hypoxemic respiratory failure ([place]); History of pulmonary hypertension; Dyspnea    sildenafil  (REVATIO) 20 MG tablet Take 20 mg by mouth 2 (two) times daily.    Associated Diagnoses: Pulmonary hypertension ([place]); Other emphysema ([place]); Chronic hypoxemic respiratory failure ([place]); History of pulmonary hypertension; Dyspnea    SPIRIVA HANDIHALER 18 MCG inhalation capsule Place 1 capsule (18 mcg total) into inhaler and inhale daily. Qty: 30 capsule, Refills: 2   Associated Diagnoses: Pulmonary hypertension ([place]); Other emphysema ([place]); Chronic hypoxemic respiratory failure ([place]); History of pulmonary hypertension; Dyspnea    temazepam (RESTORIL) 30 MG capsule Take 30 mg by mouth at bedtime.   Associated Diagnoses: Pulmonary hypertension ([place]); Other emphysema ([place]); Chronic hypoxemic respiratory failure ([place]); History of pulmonary hypertension; Dyspnea    vitamin E 1000 UNIT capsule Take 1,000 Units by mouth daily.       No Known Allergies    The results of significant diagnostics from this hospitalization (including imaging, microbiology, ancillary and laboratory) are listed below for reference.    Significant Diagnostic Studies: US Soft Tissue Head/neck  12/22/2015  CLINICAL DATA:  Thyroid nodule EXAM: THYROID ULTRASOUND TECHNIQUE: Ultrasound examination of the thyroid gland and adjacent soft tissues was performed. COMPARISON:  None. FINDINGS: Right thyroid lobe Measurements: 41 x 18 x 14 mm. Mild hyperemia. Several cysts, largest 8 x 5 x 8 mm,  mid lobe. 10 x 7 x 8 mm solid nodule, superior pole. Left thyroid lobe Measurements: 46 x 19 x 22 mm. Heterogeneous background echotexture. 10 x 7 x 7 mm nodule and adjacent 10 x 7 x 6 mm nodule in the background of relatively hypoechoic parenchyma. Isthmus Thickness: 5 mm.  No nodules visualized. Lymphadenopathy None visualized. IMPRESSION: 1. Normal-sized thyroid with small bilateral nodules. Findings do not meet current consensus criteria for biopsy. Follow-up by clinical exam is recommended. If patient has known risk factors for thyroid  carcinoma, consider follow-up ultrasound in 12 months. If patient is clinically hyperthyroid, consider nuclear medicine thyroid uptake and scan. This recommendation follows the consensus statement: Management of Thyroid Nodules Detected as [place]: Society of Radiologists in [place]. Radiology 2005; [id number]. Electronically Signed   By: Lucrezia Europe M.D.   On: 12/22/2015 15:12   [place] Screening Aaa  12/22/2015  CLINICAL DATA:  Medicare screening exam for abdominal aortic aneurysm. EXAM: ABDOMINAL AORTA SCREENING ULTRASOUND TECHNIQUE: Ultrasound examination of the abdominal aorta was performed as a screening evaluation for abdominal aortic aneurysm. COMPARISON:  None. FINDINGS: Abdominal Aorta No aneurysm identified. Maximum Diameter: 2.6 cm, proximal segment. Iliac arteries both measure 1 cm maximum diameter. IMPRESSION: 1. Negative for abdominal aortic aneurysm. Electronically Signed   By: Lucrezia Europe M.D.   On: 12/22/2015 15:13   Dg Chest Port 1 View  12/30/2015  CLINICAL DATA:  Decreased respirations EXAM: PORTABLE CHEST 1 VIEW COMPARISON:  12/28/2015 FINDINGS: Emphysematous changes are again identified. There is new linear density identified in the left mid lung consistent with atelectasis or early infiltrate. Cardiac shadow is stable. No acute bony abnormality is noted. IMPRESSION: New linear density in the right mid lung. Electronically Signed   By: Inez Catalina M.D.   On: 12/30/2015 18:49   Dg Chest Port 1 View  12/28/2015  CLINICAL DATA:  75 year old male with fever and cough EXAM: PORTABLE CHEST 1 VIEW COMPARISON:  Chest radiograph dated 10/02/2015 FINDINGS: There is severe falls emphysematous changes of the lungs. There are bibasilar atelectatic changes/ scarring. There is a large bulla in the right upper lobe. No focal consolidation, pleural effusion, or pneumothorax. The cardiac silhouette is within normal limits. No acute osseous pathology. IMPRESSION: Emphysema.  No  focal consolidation or pneumothorax. Electronically Signed   By: Anner Crete M.D.   On: 12/28/2015 02:01   Dg Abd Portable 1v  12/30/2015  CLINICAL DATA:  Abdominal pain EXAM: PORTABLE ABDOMEN - 1 VIEW COMPARISON:  None. FINDINGS: Scattered large and small bowel gas is noted. No obstructive changes are seen. No free air is noted. No acute bony abnormality is seen. IMPRESSION: No acute abnormality noted. Electronically Signed   By: Inez Catalina M.D.   On: 12/30/2015 18:50    Microbiology: Recent Results (from the past 240 hour(s))  Blood Culture (routine x 2)     Status: None   Collection Time: 12/28/15  1:44 AM  Result Value Ref Range Status   Specimen Description BLOOD LEFT ARM  Final   Special Requests BOTTLES DRAWN AEROBIC AND ANAEROBIC 5ML  Final   Culture NO GROWTH 5 DAYS  Final   Report Status 01/02/2016 FINAL  Final  Blood Culture (routine x 2)     Status: None   Collection Time: 12/28/15  1:59 AM  Result Value Ref Range Status   Specimen Description BLOOD RIGHT FOREARM  Final   Special Requests BOTTLES DRAWN AEROBIC AND ANAEROBIC 5ML  Final   Culture  NO GROWTH 5 DAYS  Final   Report Status 01/02/2016 FINAL  Final  Urine culture     Status: None   Collection Time: 12/28/15  2:30 AM  Result Value Ref Range Status   Specimen Description URINE, CATHETERIZED  Final   Special Requests NONE  Final   Culture NO GROWTH 1 DAY  Final   Report Status 12/29/2015 FINAL  Final  MRSA PCR Screening     Status: None   Collection Time: 12/28/15  6:30 AM  Result Value Ref Range Status   MRSA by PCR NEGATIVE NEGATIVE Final    Comment:        The GeneXpert MRSA Assay (FDA approved for NASAL specimens only), is one component of a comprehensive MRSA colonization surveillance program. It is not intended to diagnose MRSA infection nor to guide or monitor treatment for MRSA infections.   Respiratory virus panel     Status: None   Collection Time: 12/28/15  2:00 PM  Result Value Ref  Range Status   Respiratory Syncytial Virus A Negative Negative Final   Respiratory Syncytial Virus B Negative Negative Final   Influenza A Negative Negative Final   Influenza B Negative Negative Final   Parainfluenza 1 Negative Negative Final   Parainfluenza 2 Negative Negative Final   Parainfluenza 3 Negative Negative Final   Metapneumovirus Negative Negative Final   Rhinovirus Negative Negative Final   Adenovirus Negative Negative Final    Comment: (NOTE) Performed At: [facility] [place], [place] [id number] Lindon Romp MD IO:[id number]      Labs: Basic Metabolic Panel:  Recent Labs Lab 12/28/15 0954 12/29/15 0541 12/30/15 0233 12/31/15 0550 01/01/16 0251 01/02/16 0229  NA 141 140 141 140 140 139  K 4.0 3.9 3.4* 3.9 3.9 3.9  CL 109 110 109 107 104 101  CO2 _0 GLUCOSE 150* 133* 166* 152* 146* 145*  BUN _1 CREATININE 0.95 0.81 0.75 0.73 0.76 0.72  CALCIUM 8.4* 8.3* 8.4* 8.6* 8.7* 8.9  MG 1.8 1.8 1.8 1.9 2.0  --    Liver Function Tests:  Recent Labs Lab 12/28/15 0954 12/29/15 0541 12/30/15 0233 12/31/15 0550 01/01/16 0251  AST _2 ALT 22 21 37 51 51  ALKPHOS 56 49 48 46 47  BILITOT 0.5 0.5 0.4 0.3 0.4  PROT 5.8* 5.7* 5.1* 5.3* 5.3*  ALBUMIN 3.0* 2.7* 2.7* 2.8* 2.7*   No results for input(s): LIPASE, AMYLASE in the last 168 hours. No results for input(s): AMMONIA in the last 168 hours. CBC:  Recent Labs Lab 12/28/15 0954 12/29/15 0541 12/30/15 0233 12/31/15 0550 01/01/16 0251 01/02/16 0229  WBC 21.1* 11.1* 6.6 5.4 4.8 4.9  NEUTROABS 19.2* 10.5* 6.1 4.8 4.3  --   HGB 11.9* 11.0* 10.7* 11.1* 11.4* 11.6*  HCT 35.8* 31.6* 32.5* 33.6* 34.2* 34.3*  MCV 95.7 95.5 95.3 94.4 94.5 94.2  PLT 188 181 166 171 201 221   Cardiac Enzymes: No results for input(s): CKTOTAL, CKMB, CKMBINDEX, TROPONINI in the last 168 hours. BNP: BNP (last 3 results)  Recent Labs  01/02/16 1318  BNP  60.0    ProBNP (last 3 results) No results for input(s): PROBNP in the last 8760 hours.  CBG: No results for input(s): GLUCAP in the last 168 hours.     SignedNita Sells MD   [facility] 01/03/2016, 10:56 AM

## 2016-01-07 DIAGNOSIS — Z87891 Personal history of nicotine dependence: Secondary | ICD-10-CM | POA: Diagnosis not present

## 2016-01-07 DIAGNOSIS — R339 Retention of urine, unspecified: Secondary | ICD-10-CM | POA: Diagnosis not present

## 2016-01-07 DIAGNOSIS — J9621 Acute and chronic respiratory failure with hypoxia: Secondary | ICD-10-CM | POA: Diagnosis not present

## 2016-01-07 DIAGNOSIS — I10 Essential (primary) hypertension: Secondary | ICD-10-CM | POA: Diagnosis not present

## 2016-01-07 DIAGNOSIS — I272 Other secondary pulmonary hypertension: Secondary | ICD-10-CM | POA: Diagnosis not present

## 2016-01-07 DIAGNOSIS — Z8701 Personal history of pneumonia (recurrent): Secondary | ICD-10-CM | POA: Diagnosis not present

## 2016-01-07 DIAGNOSIS — J449 Chronic obstructive pulmonary disease, unspecified: Secondary | ICD-10-CM | POA: Diagnosis not present

## 2016-01-10 ENCOUNTER — Emergency Department (HOSPITAL_COMMUNITY): Payer: Medicare Other

## 2016-01-10 ENCOUNTER — Encounter (HOSPITAL_COMMUNITY): Payer: Self-pay | Admitting: Emergency Medicine

## 2016-01-10 ENCOUNTER — Emergency Department (HOSPITAL_COMMUNITY)
Admission: EM | Admit: 2016-01-10 | Discharge: 2016-01-10 | Disposition: A | Discharge status: Home / Self Care | Payer: Medicare Other | Attending: Emergency Medicine | Admitting: Emergency Medicine

## 2016-01-10 DIAGNOSIS — Y9289 Other specified places as the place of occurrence of the external cause: Secondary | ICD-10-CM | POA: Insufficient documentation

## 2016-01-10 DIAGNOSIS — I951 Orthostatic hypotension: Secondary | ICD-10-CM | POA: Diagnosis not present

## 2016-01-10 DIAGNOSIS — W01198A Fall on same level from slipping, tripping and stumbling with subsequent striking against other object, initial encounter: Secondary | ICD-10-CM | POA: Diagnosis not present

## 2016-01-10 DIAGNOSIS — Z87891 Personal history of nicotine dependence: Secondary | ICD-10-CM | POA: Diagnosis not present

## 2016-01-10 DIAGNOSIS — S0003XA Contusion of scalp, initial encounter: Secondary | ICD-10-CM | POA: Diagnosis not present

## 2016-01-10 DIAGNOSIS — R531 Weakness: Secondary | ICD-10-CM | POA: Diagnosis not present

## 2016-01-10 DIAGNOSIS — J439 Emphysema, unspecified: Secondary | ICD-10-CM | POA: Diagnosis not present

## 2016-01-10 DIAGNOSIS — R404 Transient alteration of awareness: Secondary | ICD-10-CM | POA: Diagnosis not present

## 2016-01-10 DIAGNOSIS — Z79899 Other long term (current) drug therapy: Secondary | ICD-10-CM | POA: Insufficient documentation

## 2016-01-10 DIAGNOSIS — I10 Essential (primary) hypertension: Secondary | ICD-10-CM | POA: Diagnosis not present

## 2016-01-10 DIAGNOSIS — Y9389 Activity, other specified: Secondary | ICD-10-CM | POA: Insufficient documentation

## 2016-01-10 DIAGNOSIS — Y998 Other external cause status: Secondary | ICD-10-CM | POA: Diagnosis not present

## 2016-01-10 DIAGNOSIS — R55 Syncope and collapse: Secondary | ICD-10-CM | POA: Diagnosis present

## 2016-01-10 LAB — I-STAT CHEM 8, ED
BUN: 17 mg/dL (ref 6–20)
CALCIUM ION: 0.99 mmol/L — AB (ref 1.13–1.30)
CREATININE: 0.8 mg/dL (ref 0.61–1.24)
Chloride: 99 mmol/L — ABNORMAL LOW (ref 101–111)
GLUCOSE: 103 mg/dL — AB (ref 65–99)
HCT: 39 % (ref 39.0–52.0)
HEMOGLOBIN: 13.3 g/dL (ref 13.0–17.0)
Potassium: 4.1 mmol/L (ref 3.5–5.1)
Sodium: 137 mmol/L (ref 135–145)
TCO2: 26 mmol/L (ref 0–100)

## 2016-01-10 LAB — I-STAT TROPONIN, ED: TROPONIN I, POC: 0 ng/mL (ref 0.00–0.08)

## 2016-01-10 MED ORDER — SODIUM CHLORIDE 0.9 % IV BOLUS (SEPSIS)
1000.0000 mL | Freq: Once | INTRAVENOUS | Status: AC
  Administered 2016-01-10: 1000 mL via INTRAVENOUS

## 2016-01-10 NOTE — ED Notes (Signed)
MD at bedside. 

## 2016-01-10 NOTE — ED Notes (Signed)
Per [facility] pt from home, per family pt had syncopal episode that lasted 30 second to 1 min. Per pt he was laying down for 1 hour and when he stood up he felt dizzy walked to kitchen where he had syncope , fell backward, right shoulder and back of head injury. per ems pt has occipital hematoma. Pt denies anticoagulant intake. Per EMS pt is orthostatic at scene. denies pain at all at this time. Alert and oriented x 4. Pt is on home oxygen 4 L [place]. Pt denies headache nor weakness to extremities per ems per pt.

## 2016-01-10 NOTE — Discharge Instructions (Signed)

## 2016-01-10 NOTE — ED Provider Notes (Signed)
CSN: [id number]     Arrival date & time 01/10/16  [id number] History   First MD Initiated Contact with Patient 01/10/16 [phone]     Chief Complaint  Patient presents with  . Loss of Consciousness      HPI Patient stated had been laying down when he got up.  He felt lightheaded and had brief syncopal episode.  He stated he felt like he got up too fast.  He's had similar events in the past.  Patient denies neck pain.  Did hit his head on the back.  Is not currently taking blood thinners.  Patient denies chest pain or abdominal pain.  Patient denies weakness or numbness.  Feels back to baseline. Past Medical History  Diagnosis Date  . Emphysema lung ([place])   . Hypertension   . Pulmonary hypertension [facility])    Past Surgical History  Procedure Laterality Date  . Tonsillectomy    . Lung surgery     Family History  Problem Relation Age of Onset  . Bone cancer Maternal Uncle    Social History  Substance Use Topics  . Smoking status: Former Smoker -- 0.00 packs/day for 0 years    Types: Cigarettes    Quit date: 10/01/1984  . Smokeless tobacco: None  . Alcohol Use: No    Review of Systems  All other systems reviewed and are negative.     Allergies  Review of patient's allergies indicates no known allergies.  Home Medications   Prior to Admission medications   Medication Sig Start Date End Date Taking? Authorizing Provider  ADVAIR DISKUS 250-50 MCG/DOSE AEPB Inhale 1 puff into the lungs 2 (two) times daily. 11/01/15  Yes Noralee Space, MD  albuterol (PROVENTIL HFA;VENTOLIN HFA) 108 (90 Base) MCG/ACT inhaler Inhale 2 puffs into the lungs every 6 (six) hours as needed for wheezing or shortness of breath. 01/03/16  Yes Nita Sells, MD  CALCIUM PO Take 1 tablet by mouth daily.   Yes Historical Provider, MD  dextromethorphan-guaiFENesin (MUCINEX DM) 30-600 MG 12hr tablet Take 1 tablet by mouth 2 (two) times daily. 01/03/16  Yes Nita Sells, MD  diltiazem (TIAZAC) 240 MG 24 hr  capsule Take 240 mg by mouth daily.   Yes Historical Provider, MD  esomeprazole (NEXIUM) 20 MG capsule Take 20 mg by mouth 2 (two) times daily before a meal.    Yes Historical Provider, MD  finasteride (PROSCAR) 5 MG tablet Take 5 mg by mouth daily.    Yes Historical Provider, MD  magic mouthwash SOLN Take 5-10 mLs by mouth every 4 (four) hours as needed for mouth pain. Gargle and spit 01/08/16  Yes Historical Provider, MD  Multiple Vitamins-Minerals (MULTIVITAMIN WITH MINERALS) tablet Take 1 tablet by mouth daily.   Yes Historical Provider, MD  polyethylene glycol (MIRALAX / GLYCOLAX) packet Take 17 g by mouth at bedtime.   Yes Historical Provider, MD  rosuvastatin (CRESTOR) 10 MG tablet Take 10 mg by mouth daily.   Yes Historical Provider, MD  sildenafil (REVATIO) 20 MG tablet Take 20 mg by mouth 2 (two) times daily.    Yes Historical Provider, MD  SPIRIVA HANDIHALER 18 MCG inhalation capsule Place 1 capsule (18 mcg total) into inhaler and inhale daily. 11/01/15  Yes Noralee Space, MD  tamsulosin (FLOMAX) 0.4 MG CAPS capsule Take 1 capsule (0.4 mg total) by mouth daily after breakfast. 01/03/16  Yes Nita Sells, MD  temazepam (RESTORIL) 30 MG capsule Take 30 mg by mouth at bedtime.  Yes Historical Provider, MD  vitamin E 1000 UNIT capsule Take 1,000 Units by mouth daily.   Yes Historical Provider, MD  nystatin (MYCOSTATIN) 100000 UNIT/ML suspension Take 6 mLs by mouth 4 (four) times daily. Reported on 01/10/2016 01/08/16   Historical Provider, MD  predniSONE (DELTASONE) 20 MG tablet Take 2 tablets (40 mg total) by mouth daily before breakfast. Patient not taking: Reported on 01/10/2016 01/04/16   Nita Sells, MD   BP 110/64 mmHg  Pulse 85  Temp(Src) 97.9 F (36.6 C) (Oral)  Resp 19  SpO2 96% Physical Exam  Constitutional: He is oriented to person, place, and time. He appears well-developed and well-nourished. No distress.  HENT:  Head: Normocephalic.    Eyes: Pupils are  equal, round, and reactive to light.  Neck: Normal range of motion.  Cardiovascular: Normal rate and intact distal pulses.   Pulmonary/Chest: No respiratory distress.  Abdominal: Normal appearance. He exhibits no distension.  Musculoskeletal: Normal range of motion.  Neurological: He is alert and oriented to person, place, and time. He has normal strength. No cranial nerve deficit or sensory deficit. GCS eye subscore is 4. GCS verbal subscore is 5. GCS motor subscore is 6.  Skin: Skin is warm and dry. No rash noted.  Psychiatric: He has a normal mood and affect. His behavior is normal.  Nursing note and vitals reviewed.   ED Course  Procedures (including critical care time) Labs Review Labs Reviewed  I-STAT CHEM 8, ED - Abnormal; Notable for the following:    Chloride 99 (*)    Glucose, Bld 103 (*)    Calcium, Ion 0.99 (*)    All other components within normal limits  I-STAT TROPOININ, ED    Imaging Review Ct Head Wo Contrast  01/10/2016  CLINICAL DATA:  75 year old male with syncope, loss of consciousness lasting up to 1 minute. Occipital hematoma. Initial encounter. EXAM: CT HEAD WITHOUT CONTRAST TECHNIQUE: Contiguous axial images were obtained from the base of the skull through the vertex without intravenous contrast. COMPARISON:  None. FINDINGS: Low-density fluid level in the right maxillary sinus. Trace inferior mastoid fluid bilaterally. Small volume retained secretions in the nasopharynx. Other Visualized paranasal sinuses and mastoids are clear. Mild right posterior convexity scalp hematoma measuring up to 6 mm in thickness. Underlying calvarium intact. No acute osseous abnormality identified. No other acute orbit or scalp soft tissue finding. Calcified atherosclerosis at the skull base. Cerebral volume is within normal limits for age. No midline shift, ventriculomegaly, mass effect, evidence of mass lesion, intracranial hemorrhage or evidence of cortically based acute infarction.  Gray-white matter differentiation is within normal limits throughout the brain. No suspicious intracranial vascular hyperdensity. Mild dural calcifications suspected along the tentorium (series 2, image 12). IMPRESSION: 1.  Normal for age non contrast CT appearance of the brain. 2. Right posterior scalp hematoma without underlying fracture. 3. Low-density fluid level in the right maxillary sinus, favor inflammatory. Electronically Signed   By: Genevie Ann M.D.   On: 01/10/2016 10:29   I have personally reviewed and evaluated these images and lab results as part of my medical decision-making.   EKG Interpretation   Date/Time:  Wednesday January 10 2016 09:36:23 EST Ventricular Rate:  69 PR Interval:  140 QRS Duration: 86 QT Interval:  398 QTC Calculation: 426 R Axis:   29 Text Interpretation:  Normal sinus rhythm Septal infarct , age  undetermined Abnormal ECG No significant change since last tracing  Baseline wander Confirmed by Jaedyn Lard  MD, Hailley Byers ([id number])  on 01/10/2016  9:45:49 AM     After 2 L of fluid patient's symptoms have improved dramatically and he is no longer dizzy when he stands up.  We discharged home with encouragement to drink plenty of fluids. MDM   Final diagnoses:  Syncope due to orthostatic hypotension        Leonard Schwartz, MD 01/10/16 1524

## 2016-01-10 NOTE — ED Notes (Signed)
Meal tray was given to pt.  

## 2016-01-10 NOTE — ED Notes (Signed)
Bed: [facility] Expected date:  Expected time:  Means of arrival:  Comments: EMS 50yr syncope  HX Phneumonia

## 2016-01-11 DIAGNOSIS — I10 Essential (primary) hypertension: Secondary | ICD-10-CM | POA: Diagnosis not present

## 2016-01-11 DIAGNOSIS — J9621 Acute and chronic respiratory failure with hypoxia: Secondary | ICD-10-CM | POA: Diagnosis not present

## 2016-01-11 DIAGNOSIS — J449 Chronic obstructive pulmonary disease, unspecified: Secondary | ICD-10-CM | POA: Diagnosis not present

## 2016-01-11 DIAGNOSIS — R339 Retention of urine, unspecified: Secondary | ICD-10-CM | POA: Diagnosis not present

## 2016-01-11 DIAGNOSIS — Z8701 Personal history of pneumonia (recurrent): Secondary | ICD-10-CM | POA: Diagnosis not present

## 2016-01-11 DIAGNOSIS — I272 Other secondary pulmonary hypertension: Secondary | ICD-10-CM | POA: Diagnosis not present

## 2016-01-12 DIAGNOSIS — R339 Retention of urine, unspecified: Secondary | ICD-10-CM | POA: Diagnosis not present

## 2016-01-12 DIAGNOSIS — Z8701 Personal history of pneumonia (recurrent): Secondary | ICD-10-CM | POA: Diagnosis not present

## 2016-01-12 DIAGNOSIS — I272 Other secondary pulmonary hypertension: Secondary | ICD-10-CM | POA: Diagnosis not present

## 2016-01-12 DIAGNOSIS — I10 Essential (primary) hypertension: Secondary | ICD-10-CM | POA: Diagnosis not present

## 2016-01-12 DIAGNOSIS — J449 Chronic obstructive pulmonary disease, unspecified: Secondary | ICD-10-CM | POA: Diagnosis not present

## 2016-01-12 DIAGNOSIS — J9621 Acute and chronic respiratory failure with hypoxia: Secondary | ICD-10-CM | POA: Diagnosis not present

## 2016-01-13 DIAGNOSIS — Z8701 Personal history of pneumonia (recurrent): Secondary | ICD-10-CM | POA: Diagnosis not present

## 2016-01-13 DIAGNOSIS — R339 Retention of urine, unspecified: Secondary | ICD-10-CM | POA: Diagnosis not present

## 2016-01-13 DIAGNOSIS — J9621 Acute and chronic respiratory failure with hypoxia: Secondary | ICD-10-CM | POA: Diagnosis not present

## 2016-01-13 DIAGNOSIS — I272 Other secondary pulmonary hypertension: Secondary | ICD-10-CM | POA: Diagnosis not present

## 2016-01-13 DIAGNOSIS — I10 Essential (primary) hypertension: Secondary | ICD-10-CM | POA: Diagnosis not present

## 2016-01-13 DIAGNOSIS — J449 Chronic obstructive pulmonary disease, unspecified: Secondary | ICD-10-CM | POA: Diagnosis not present

## 2016-01-15 DIAGNOSIS — R339 Retention of urine, unspecified: Secondary | ICD-10-CM | POA: Diagnosis not present

## 2016-01-15 DIAGNOSIS — J449 Chronic obstructive pulmonary disease, unspecified: Secondary | ICD-10-CM | POA: Diagnosis not present

## 2016-01-15 DIAGNOSIS — I272 Other secondary pulmonary hypertension: Secondary | ICD-10-CM | POA: Diagnosis not present

## 2016-01-15 DIAGNOSIS — I10 Essential (primary) hypertension: Secondary | ICD-10-CM | POA: Diagnosis not present

## 2016-01-15 DIAGNOSIS — Z8701 Personal history of pneumonia (recurrent): Secondary | ICD-10-CM | POA: Diagnosis not present

## 2016-01-15 DIAGNOSIS — J9621 Acute and chronic respiratory failure with hypoxia: Secondary | ICD-10-CM | POA: Diagnosis not present

## 2016-01-16 DIAGNOSIS — R339 Retention of urine, unspecified: Secondary | ICD-10-CM | POA: Diagnosis not present

## 2016-01-16 DIAGNOSIS — J9621 Acute and chronic respiratory failure with hypoxia: Secondary | ICD-10-CM | POA: Diagnosis not present

## 2016-01-16 DIAGNOSIS — I10 Essential (primary) hypertension: Secondary | ICD-10-CM | POA: Diagnosis not present

## 2016-01-16 DIAGNOSIS — Z8701 Personal history of pneumonia (recurrent): Secondary | ICD-10-CM | POA: Diagnosis not present

## 2016-01-16 DIAGNOSIS — J449 Chronic obstructive pulmonary disease, unspecified: Secondary | ICD-10-CM | POA: Diagnosis not present

## 2016-01-16 DIAGNOSIS — I272 Other secondary pulmonary hypertension: Secondary | ICD-10-CM | POA: Diagnosis not present

## 2016-01-17 DIAGNOSIS — Z79899 Other long term (current) drug therapy: Secondary | ICD-10-CM | POA: Diagnosis not present

## 2016-01-17 DIAGNOSIS — R55 Syncope and collapse: Secondary | ICD-10-CM | POA: Diagnosis not present

## 2016-01-17 DIAGNOSIS — B001 Herpesviral vesicular dermatitis: Secondary | ICD-10-CM | POA: Diagnosis not present

## 2016-01-17 DIAGNOSIS — I272 Other secondary pulmonary hypertension: Secondary | ICD-10-CM | POA: Diagnosis not present

## 2016-01-17 DIAGNOSIS — J439 Emphysema, unspecified: Secondary | ICD-10-CM | POA: Diagnosis not present

## 2016-01-17 DIAGNOSIS — I10 Essential (primary) hypertension: Secondary | ICD-10-CM | POA: Diagnosis not present

## 2016-01-18 DIAGNOSIS — I272 Other secondary pulmonary hypertension: Secondary | ICD-10-CM | POA: Diagnosis not present

## 2016-01-18 DIAGNOSIS — R339 Retention of urine, unspecified: Secondary | ICD-10-CM | POA: Diagnosis not present

## 2016-01-18 DIAGNOSIS — J449 Chronic obstructive pulmonary disease, unspecified: Secondary | ICD-10-CM | POA: Diagnosis not present

## 2016-01-18 DIAGNOSIS — J9621 Acute and chronic respiratory failure with hypoxia: Secondary | ICD-10-CM | POA: Diagnosis not present

## 2016-01-18 DIAGNOSIS — I10 Essential (primary) hypertension: Secondary | ICD-10-CM | POA: Diagnosis not present

## 2016-01-18 DIAGNOSIS — Z8701 Personal history of pneumonia (recurrent): Secondary | ICD-10-CM | POA: Diagnosis not present

## 2016-01-19 DIAGNOSIS — I272 Other secondary pulmonary hypertension: Secondary | ICD-10-CM | POA: Diagnosis not present

## 2016-01-19 DIAGNOSIS — J9621 Acute and chronic respiratory failure with hypoxia: Secondary | ICD-10-CM | POA: Diagnosis not present

## 2016-01-19 DIAGNOSIS — Z8701 Personal history of pneumonia (recurrent): Secondary | ICD-10-CM | POA: Diagnosis not present

## 2016-01-19 DIAGNOSIS — R339 Retention of urine, unspecified: Secondary | ICD-10-CM | POA: Diagnosis not present

## 2016-01-19 DIAGNOSIS — J449 Chronic obstructive pulmonary disease, unspecified: Secondary | ICD-10-CM | POA: Diagnosis not present

## 2016-01-19 DIAGNOSIS — I10 Essential (primary) hypertension: Secondary | ICD-10-CM | POA: Diagnosis not present

## 2016-01-22 ENCOUNTER — Encounter (HOSPITAL_BASED_OUTPATIENT_CLINIC_OR_DEPARTMENT_OTHER): Payer: Medicare Other

## 2016-01-22 DIAGNOSIS — J9621 Acute and chronic respiratory failure with hypoxia: Secondary | ICD-10-CM | POA: Diagnosis not present

## 2016-01-22 DIAGNOSIS — Z8701 Personal history of pneumonia (recurrent): Secondary | ICD-10-CM | POA: Diagnosis not present

## 2016-01-22 DIAGNOSIS — I10 Essential (primary) hypertension: Secondary | ICD-10-CM | POA: Diagnosis not present

## 2016-01-22 DIAGNOSIS — J449 Chronic obstructive pulmonary disease, unspecified: Secondary | ICD-10-CM | POA: Diagnosis not present

## 2016-01-22 DIAGNOSIS — R339 Retention of urine, unspecified: Secondary | ICD-10-CM | POA: Diagnosis not present

## 2016-01-22 DIAGNOSIS — I272 Other secondary pulmonary hypertension: Secondary | ICD-10-CM | POA: Diagnosis not present

## 2016-01-23 DIAGNOSIS — Z8701 Personal history of pneumonia (recurrent): Secondary | ICD-10-CM | POA: Diagnosis not present

## 2016-01-23 DIAGNOSIS — J9621 Acute and chronic respiratory failure with hypoxia: Secondary | ICD-10-CM | POA: Diagnosis not present

## 2016-01-23 DIAGNOSIS — J449 Chronic obstructive pulmonary disease, unspecified: Secondary | ICD-10-CM | POA: Diagnosis not present

## 2016-01-23 DIAGNOSIS — I272 Other secondary pulmonary hypertension: Secondary | ICD-10-CM | POA: Diagnosis not present

## 2016-01-23 DIAGNOSIS — R339 Retention of urine, unspecified: Secondary | ICD-10-CM | POA: Diagnosis not present

## 2016-01-23 DIAGNOSIS — I10 Essential (primary) hypertension: Secondary | ICD-10-CM | POA: Diagnosis not present

## 2016-01-24 DIAGNOSIS — J449 Chronic obstructive pulmonary disease, unspecified: Secondary | ICD-10-CM | POA: Diagnosis not present

## 2016-01-24 DIAGNOSIS — J9621 Acute and chronic respiratory failure with hypoxia: Secondary | ICD-10-CM | POA: Diagnosis not present

## 2016-01-24 DIAGNOSIS — Z8701 Personal history of pneumonia (recurrent): Secondary | ICD-10-CM | POA: Diagnosis not present

## 2016-01-24 DIAGNOSIS — I272 Other secondary pulmonary hypertension: Secondary | ICD-10-CM | POA: Diagnosis not present

## 2016-01-24 DIAGNOSIS — I10 Essential (primary) hypertension: Secondary | ICD-10-CM | POA: Diagnosis not present

## 2016-01-24 DIAGNOSIS — R339 Retention of urine, unspecified: Secondary | ICD-10-CM | POA: Diagnosis not present

## 2016-01-25 DIAGNOSIS — Z8701 Personal history of pneumonia (recurrent): Secondary | ICD-10-CM | POA: Diagnosis not present

## 2016-01-25 DIAGNOSIS — J9621 Acute and chronic respiratory failure with hypoxia: Secondary | ICD-10-CM | POA: Diagnosis not present

## 2016-01-25 DIAGNOSIS — R339 Retention of urine, unspecified: Secondary | ICD-10-CM | POA: Diagnosis not present

## 2016-01-25 DIAGNOSIS — I272 Other secondary pulmonary hypertension: Secondary | ICD-10-CM | POA: Diagnosis not present

## 2016-01-25 DIAGNOSIS — I10 Essential (primary) hypertension: Secondary | ICD-10-CM | POA: Diagnosis not present

## 2016-01-25 DIAGNOSIS — J449 Chronic obstructive pulmonary disease, unspecified: Secondary | ICD-10-CM | POA: Diagnosis not present

## 2016-01-26 DIAGNOSIS — R339 Retention of urine, unspecified: Secondary | ICD-10-CM | POA: Diagnosis not present

## 2016-01-26 DIAGNOSIS — I10 Essential (primary) hypertension: Secondary | ICD-10-CM | POA: Diagnosis not present

## 2016-01-26 DIAGNOSIS — I272 Other secondary pulmonary hypertension: Secondary | ICD-10-CM | POA: Diagnosis not present

## 2016-01-26 DIAGNOSIS — J9621 Acute and chronic respiratory failure with hypoxia: Secondary | ICD-10-CM | POA: Diagnosis not present

## 2016-01-26 DIAGNOSIS — J449 Chronic obstructive pulmonary disease, unspecified: Secondary | ICD-10-CM | POA: Diagnosis not present

## 2016-01-26 DIAGNOSIS — Z8701 Personal history of pneumonia (recurrent): Secondary | ICD-10-CM | POA: Diagnosis not present

## 2016-01-29 DIAGNOSIS — J449 Chronic obstructive pulmonary disease, unspecified: Secondary | ICD-10-CM | POA: Diagnosis not present

## 2016-01-29 DIAGNOSIS — I272 Other secondary pulmonary hypertension: Secondary | ICD-10-CM | POA: Diagnosis not present

## 2016-01-29 DIAGNOSIS — Z8701 Personal history of pneumonia (recurrent): Secondary | ICD-10-CM | POA: Diagnosis not present

## 2016-01-29 DIAGNOSIS — I10 Essential (primary) hypertension: Secondary | ICD-10-CM | POA: Diagnosis not present

## 2016-01-29 DIAGNOSIS — J9621 Acute and chronic respiratory failure with hypoxia: Secondary | ICD-10-CM | POA: Diagnosis not present

## 2016-01-29 DIAGNOSIS — R339 Retention of urine, unspecified: Secondary | ICD-10-CM | POA: Diagnosis not present

## 2016-01-30 DIAGNOSIS — J449 Chronic obstructive pulmonary disease, unspecified: Secondary | ICD-10-CM | POA: Diagnosis not present

## 2016-01-30 DIAGNOSIS — I272 Other secondary pulmonary hypertension: Secondary | ICD-10-CM | POA: Diagnosis not present

## 2016-01-30 DIAGNOSIS — J9621 Acute and chronic respiratory failure with hypoxia: Secondary | ICD-10-CM | POA: Diagnosis not present

## 2016-01-30 DIAGNOSIS — Z8701 Personal history of pneumonia (recurrent): Secondary | ICD-10-CM | POA: Diagnosis not present

## 2016-01-30 DIAGNOSIS — I10 Essential (primary) hypertension: Secondary | ICD-10-CM | POA: Diagnosis not present

## 2016-01-30 DIAGNOSIS — R339 Retention of urine, unspecified: Secondary | ICD-10-CM | POA: Diagnosis not present

## 2016-01-31 DIAGNOSIS — J9621 Acute and chronic respiratory failure with hypoxia: Secondary | ICD-10-CM | POA: Diagnosis not present

## 2016-01-31 DIAGNOSIS — R339 Retention of urine, unspecified: Secondary | ICD-10-CM | POA: Diagnosis not present

## 2016-01-31 DIAGNOSIS — I272 Other secondary pulmonary hypertension: Secondary | ICD-10-CM | POA: Diagnosis not present

## 2016-01-31 DIAGNOSIS — I10 Essential (primary) hypertension: Secondary | ICD-10-CM | POA: Diagnosis not present

## 2016-01-31 DIAGNOSIS — Z8701 Personal history of pneumonia (recurrent): Secondary | ICD-10-CM | POA: Diagnosis not present

## 2016-01-31 DIAGNOSIS — J449 Chronic obstructive pulmonary disease, unspecified: Secondary | ICD-10-CM | POA: Diagnosis not present

## 2016-02-01 DIAGNOSIS — I10 Essential (primary) hypertension: Secondary | ICD-10-CM | POA: Diagnosis not present

## 2016-02-01 DIAGNOSIS — J9621 Acute and chronic respiratory failure with hypoxia: Secondary | ICD-10-CM | POA: Diagnosis not present

## 2016-02-01 DIAGNOSIS — J449 Chronic obstructive pulmonary disease, unspecified: Secondary | ICD-10-CM | POA: Diagnosis not present

## 2016-02-01 DIAGNOSIS — I272 Other secondary pulmonary hypertension: Secondary | ICD-10-CM | POA: Diagnosis not present

## 2016-02-01 DIAGNOSIS — Z8701 Personal history of pneumonia (recurrent): Secondary | ICD-10-CM | POA: Diagnosis not present

## 2016-02-01 DIAGNOSIS — R339 Retention of urine, unspecified: Secondary | ICD-10-CM | POA: Diagnosis not present

## 2016-02-05 DIAGNOSIS — I272 Other secondary pulmonary hypertension: Secondary | ICD-10-CM | POA: Diagnosis not present

## 2016-02-05 DIAGNOSIS — R339 Retention of urine, unspecified: Secondary | ICD-10-CM | POA: Diagnosis not present

## 2016-02-05 DIAGNOSIS — Z8701 Personal history of pneumonia (recurrent): Secondary | ICD-10-CM | POA: Diagnosis not present

## 2016-02-05 DIAGNOSIS — I10 Essential (primary) hypertension: Secondary | ICD-10-CM | POA: Diagnosis not present

## 2016-02-05 DIAGNOSIS — J449 Chronic obstructive pulmonary disease, unspecified: Secondary | ICD-10-CM | POA: Diagnosis not present

## 2016-02-05 DIAGNOSIS — J9621 Acute and chronic respiratory failure with hypoxia: Secondary | ICD-10-CM | POA: Diagnosis not present

## 2016-02-06 DIAGNOSIS — R339 Retention of urine, unspecified: Secondary | ICD-10-CM | POA: Diagnosis not present

## 2016-02-06 DIAGNOSIS — I10 Essential (primary) hypertension: Secondary | ICD-10-CM | POA: Diagnosis not present

## 2016-02-06 DIAGNOSIS — I272 Other secondary pulmonary hypertension: Secondary | ICD-10-CM | POA: Diagnosis not present

## 2016-02-06 DIAGNOSIS — J9621 Acute and chronic respiratory failure with hypoxia: Secondary | ICD-10-CM | POA: Diagnosis not present

## 2016-02-06 DIAGNOSIS — Z8701 Personal history of pneumonia (recurrent): Secondary | ICD-10-CM | POA: Diagnosis not present

## 2016-02-06 DIAGNOSIS — J449 Chronic obstructive pulmonary disease, unspecified: Secondary | ICD-10-CM | POA: Diagnosis not present

## 2016-02-07 DIAGNOSIS — J9621 Acute and chronic respiratory failure with hypoxia: Secondary | ICD-10-CM | POA: Diagnosis not present

## 2016-02-07 DIAGNOSIS — I10 Essential (primary) hypertension: Secondary | ICD-10-CM | POA: Diagnosis not present

## 2016-02-07 DIAGNOSIS — Z8701 Personal history of pneumonia (recurrent): Secondary | ICD-10-CM | POA: Diagnosis not present

## 2016-02-07 DIAGNOSIS — I272 Other secondary pulmonary hypertension: Secondary | ICD-10-CM | POA: Diagnosis not present

## 2016-02-07 DIAGNOSIS — J449 Chronic obstructive pulmonary disease, unspecified: Secondary | ICD-10-CM | POA: Diagnosis not present

## 2016-02-07 DIAGNOSIS — R339 Retention of urine, unspecified: Secondary | ICD-10-CM | POA: Diagnosis not present

## 2016-02-08 DIAGNOSIS — R339 Retention of urine, unspecified: Secondary | ICD-10-CM | POA: Diagnosis not present

## 2016-02-08 DIAGNOSIS — J449 Chronic obstructive pulmonary disease, unspecified: Secondary | ICD-10-CM | POA: Diagnosis not present

## 2016-02-08 DIAGNOSIS — I10 Essential (primary) hypertension: Secondary | ICD-10-CM | POA: Diagnosis not present

## 2016-02-08 DIAGNOSIS — Z8701 Personal history of pneumonia (recurrent): Secondary | ICD-10-CM | POA: Diagnosis not present

## 2016-02-08 DIAGNOSIS — J9621 Acute and chronic respiratory failure with hypoxia: Secondary | ICD-10-CM | POA: Diagnosis not present

## 2016-02-08 DIAGNOSIS — I272 Other secondary pulmonary hypertension: Secondary | ICD-10-CM | POA: Diagnosis not present

## 2016-02-09 DIAGNOSIS — J9621 Acute and chronic respiratory failure with hypoxia: Secondary | ICD-10-CM | POA: Diagnosis not present

## 2016-02-09 DIAGNOSIS — I10 Essential (primary) hypertension: Secondary | ICD-10-CM | POA: Diagnosis not present

## 2016-02-09 DIAGNOSIS — I272 Other secondary pulmonary hypertension: Secondary | ICD-10-CM | POA: Diagnosis not present

## 2016-02-09 DIAGNOSIS — J449 Chronic obstructive pulmonary disease, unspecified: Secondary | ICD-10-CM | POA: Diagnosis not present

## 2016-02-09 DIAGNOSIS — Z8701 Personal history of pneumonia (recurrent): Secondary | ICD-10-CM | POA: Diagnosis not present

## 2016-02-09 DIAGNOSIS — R339 Retention of urine, unspecified: Secondary | ICD-10-CM | POA: Diagnosis not present

## 2016-02-11 ENCOUNTER — Emergency Department (HOSPITAL_COMMUNITY): Payer: Medicare Other

## 2016-02-11 ENCOUNTER — Encounter (HOSPITAL_COMMUNITY): Payer: Self-pay | Admitting: Emergency Medicine

## 2016-02-11 ENCOUNTER — Inpatient Hospital Stay (HOSPITAL_COMMUNITY)
Admission: EM | Admit: 2016-02-11 | Discharge: 2016-02-15 | DRG: 190 | Disposition: A | Discharge status: Skilled Nursing Facility | Payer: Medicare Other | Attending: Internal Medicine | Admitting: Internal Medicine

## 2016-02-11 DIAGNOSIS — N401 Enlarged prostate with lower urinary tract symptoms: Secondary | ICD-10-CM | POA: Diagnosis present

## 2016-02-11 DIAGNOSIS — Z87891 Personal history of nicotine dependence: Secondary | ICD-10-CM | POA: Diagnosis not present

## 2016-02-11 DIAGNOSIS — J449 Chronic obstructive pulmonary disease, unspecified: Secondary | ICD-10-CM | POA: Diagnosis not present

## 2016-02-11 DIAGNOSIS — J9600 Acute respiratory failure, unspecified whether with hypoxia or hypercapnia: Secondary | ICD-10-CM | POA: Diagnosis not present

## 2016-02-11 DIAGNOSIS — R0689 Other abnormalities of breathing: Secondary | ICD-10-CM | POA: Diagnosis present

## 2016-02-11 DIAGNOSIS — J438 Other emphysema: Secondary | ICD-10-CM

## 2016-02-11 DIAGNOSIS — R0902 Hypoxemia: Secondary | ICD-10-CM | POA: Diagnosis not present

## 2016-02-11 DIAGNOSIS — J9601 Acute respiratory failure with hypoxia: Secondary | ICD-10-CM | POA: Diagnosis present

## 2016-02-11 DIAGNOSIS — Z7952 Long term (current) use of systemic steroids: Secondary | ICD-10-CM

## 2016-02-11 DIAGNOSIS — R069 Unspecified abnormalities of breathing: Secondary | ICD-10-CM | POA: Diagnosis not present

## 2016-02-11 DIAGNOSIS — Z8701 Personal history of pneumonia (recurrent): Secondary | ICD-10-CM | POA: Diagnosis not present

## 2016-02-11 DIAGNOSIS — M6281 Muscle weakness (generalized): Secondary | ICD-10-CM | POA: Diagnosis not present

## 2016-02-11 DIAGNOSIS — J9621 Acute and chronic respiratory failure with hypoxia: Secondary | ICD-10-CM | POA: Diagnosis present

## 2016-02-11 DIAGNOSIS — E785 Hyperlipidemia, unspecified: Secondary | ICD-10-CM | POA: Diagnosis not present

## 2016-02-11 DIAGNOSIS — I272 Other secondary pulmonary hypertension: Secondary | ICD-10-CM | POA: Diagnosis present

## 2016-02-11 DIAGNOSIS — R0602 Shortness of breath: Secondary | ICD-10-CM

## 2016-02-11 DIAGNOSIS — J441 Chronic obstructive pulmonary disease with (acute) exacerbation: Principal | ICD-10-CM | POA: Diagnosis present

## 2016-02-11 DIAGNOSIS — J439 Emphysema, unspecified: Secondary | ICD-10-CM | POA: Diagnosis not present

## 2016-02-11 DIAGNOSIS — I10 Essential (primary) hypertension: Secondary | ICD-10-CM | POA: Diagnosis not present

## 2016-02-11 DIAGNOSIS — Z9981 Dependence on supplemental oxygen: Secondary | ICD-10-CM

## 2016-02-11 DIAGNOSIS — Z8679 Personal history of other diseases of the circulatory system: Secondary | ICD-10-CM | POA: Diagnosis not present

## 2016-02-11 DIAGNOSIS — D72829 Elevated white blood cell count, unspecified: Secondary | ICD-10-CM | POA: Diagnosis present

## 2016-02-11 DIAGNOSIS — G4733 Obstructive sleep apnea (adult) (pediatric): Secondary | ICD-10-CM | POA: Diagnosis present

## 2016-02-11 DIAGNOSIS — R338 Other retention of urine: Secondary | ICD-10-CM | POA: Diagnosis present

## 2016-02-11 DIAGNOSIS — R2681 Unsteadiness on feet: Secondary | ICD-10-CM | POA: Diagnosis not present

## 2016-02-11 DIAGNOSIS — R339 Retention of urine, unspecified: Secondary | ICD-10-CM | POA: Diagnosis not present

## 2016-02-11 DIAGNOSIS — Z79899 Other long term (current) drug therapy: Secondary | ICD-10-CM | POA: Diagnosis not present

## 2016-02-11 DIAGNOSIS — R1311 Dysphagia, oral phase: Secondary | ICD-10-CM | POA: Diagnosis not present

## 2016-02-11 LAB — BASIC METABOLIC PANEL
ANION GAP: 12 (ref 5–15)
BUN: 12 mg/dL (ref 6–20)
CALCIUM: 9.1 mg/dL (ref 8.9–10.3)
CO2: 24 mmol/L (ref 22–32)
CREATININE: 0.86 mg/dL (ref 0.61–1.24)
Chloride: 103 mmol/L (ref 101–111)
GFR calc Af Amer: >60 mL/min (ref >60)
GLUCOSE: 144 mg/dL — AB (ref 65–99)
Potassium: 4.3 mmol/L (ref 3.5–5.1)
Sodium: 139 mmol/L (ref 135–145)

## 2016-02-11 LAB — CBC WITH DIFFERENTIAL/PLATELET
BASOS PCT: 0 %
Basophils Absolute: 0 10*3/uL (ref 0.0–0.1)
Eosinophils Absolute: 0 10*3/uL (ref 0.0–0.7)
Eosinophils Relative: 0 %
HEMATOCRIT: 36.8 % — AB (ref 39.0–52.0)
HEMOGLOBIN: 12.6 g/dL — AB (ref 13.0–17.0)
LYMPHS PCT: 2 %
Lymphs Abs: 0.5 10*3/uL — ABNORMAL LOW (ref 0.7–4.0)
MCH: 31.4 pg (ref 26.0–34.0)
MCHC: 34.2 g/dL (ref 30.0–36.0)
MCV: 91.8 fL (ref 78.0–100.0)
Monocytes Absolute: 0.5 10*3/uL (ref 0.1–1.0)
Monocytes Relative: 3 %
NEUTROS ABS: 19.5 10*3/uL — AB (ref 1.7–7.7)
NEUTROS PCT: 95 %
PLATELETS: 314 10*3/uL (ref 150–400)
RBC: 4.01 MIL/uL — ABNORMAL LOW (ref 4.22–5.81)
RDW: 12.7 % (ref 11.5–15.5)
WBC: 20.6 10*3/uL — ABNORMAL HIGH (ref 4.0–10.5)

## 2016-02-11 LAB — PHOSPHORUS: Phosphorus: 2.8 mg/dL (ref 2.5–4.6)

## 2016-02-11 LAB — MAGNESIUM: Magnesium: 1.9 mg/dL (ref 1.7–2.4)

## 2016-02-11 LAB — MRSA PCR SCREENING: MRSA BY PCR: NEGATIVE

## 2016-02-11 MED ORDER — TAMSULOSIN HCL 0.4 MG PO CAPS
0.4000 mg | ORAL_CAPSULE | Freq: Every day | ORAL | Status: DC
  Administered 2016-02-12 – 2016-02-15 (×4): 0.4 mg via ORAL
  Filled 2016-02-11 (×5): qty 1

## 2016-02-11 MED ORDER — MAGNESIUM SULFATE 50 % IJ SOLN: 2.0000 g | Freq: Once | INTRAMUSCULAR | Status: DC

## 2016-02-11 MED ORDER — ONDANSETRON HCL 4 MG/2ML IJ SOLN: 4.0000 mg | Freq: Four times a day (QID) | INTRAMUSCULAR | Status: DC | PRN

## 2016-02-11 MED ORDER — SODIUM CHLORIDE 0.9 % IV SOLN
INTRAVENOUS | Status: AC
  Administered 2016-02-11 – 2016-02-12 (×2): via INTRAVENOUS

## 2016-02-11 MED ORDER — DILTIAZEM HCL ER BEADS 240 MG PO CP24
240.0000 mg | ORAL_CAPSULE | Freq: Every day | ORAL | Status: DC
  Administered 2016-02-11 – 2016-02-15 (×5): 240 mg via ORAL
  Filled 2016-02-11 (×3): qty 1
  Filled 2016-02-11 (×2): qty 2
  Filled 2016-02-11 (×2): qty 1

## 2016-02-11 MED ORDER — ACETAMINOPHEN 650 MG RE SUPP: 650.0000 mg | Freq: Four times a day (QID) | RECTAL | Status: DC | PRN

## 2016-02-11 MED ORDER — SODIUM CHLORIDE 0.9% FLUSH
3.0000 mL | Freq: Two times a day (BID) | INTRAVENOUS | Status: DC
Start: 2016-02-11 — End: 2016-02-15
  Administered 2016-02-12 – 2016-02-13 (×4): 3 mL via INTRAVENOUS

## 2016-02-11 MED ORDER — MAGNESIUM SULFATE 2 GM/50ML IV SOLN
2.0000 g | Freq: Once | INTRAVENOUS | Status: AC
  Administered 2016-02-11: 2 g via INTRAVENOUS
  Filled 2016-02-11: qty 50

## 2016-02-11 MED ORDER — IPRATROPIUM-ALBUTEROL 0.5-2.5 (3) MG/3ML IN SOLN
3.0000 mL | Freq: Four times a day (QID) | RESPIRATORY_TRACT | Status: DC
  Administered 2016-02-11 – 2016-02-14 (×11): 3 mL via RESPIRATORY_TRACT
  Filled 2016-02-11 (×12): qty 3

## 2016-02-11 MED ORDER — DEXTROSE 5 % IV SOLN
500.0000 mg | INTRAVENOUS | Status: DC
  Administered 2016-02-11 – 2016-02-13 (×3): 500 mg via INTRAVENOUS
  Filled 2016-02-11 (×3): qty 500

## 2016-02-11 MED ORDER — SILDENAFIL CITRATE 20 MG PO TABS
20.0000 mg | ORAL_TABLET | Freq: Two times a day (BID) | ORAL | Status: DC
  Administered 2016-02-11 – 2016-02-15 (×8): 20 mg via ORAL
  Filled 2016-02-11 (×10): qty 1

## 2016-02-11 MED ORDER — ALBUTEROL (5 MG/ML) CONTINUOUS INHALATION SOLN
10.0000 mg/h | INHALATION_SOLUTION | Freq: Once | RESPIRATORY_TRACT | Status: AC
  Administered 2016-02-11: 10 mg/h via RESPIRATORY_TRACT
  Filled 2016-02-11: qty 20

## 2016-02-11 MED ORDER — ALBUTEROL SULFATE (2.5 MG/3ML) 0.083% IN NEBU
2.5000 mg | INHALATION_SOLUTION | RESPIRATORY_TRACT | Status: DC | PRN
  Administered 2016-02-12 – 2016-02-15 (×3): 2.5 mg via RESPIRATORY_TRACT
  Filled 2016-02-11 (×3): qty 3

## 2016-02-11 MED ORDER — POLYETHYLENE GLYCOL 3350 17 G PO PACK
17.0000 g | PACK | Freq: Every day | ORAL | Status: DC
  Administered 2016-02-12 – 2016-02-14 (×3): 17 g via ORAL
  Filled 2016-02-11 (×6): qty 1

## 2016-02-11 MED ORDER — ACETAMINOPHEN 325 MG PO TABS: 650.0000 mg | ORAL_TABLET | Freq: Four times a day (QID) | ORAL | Status: DC | PRN

## 2016-02-11 MED ORDER — DM-GUAIFENESIN ER 30-600 MG PO TB12
1.0000 | ORAL_TABLET | Freq: Two times a day (BID) | ORAL | Status: DC
  Administered 2016-02-11 – 2016-02-15 (×8): 1 via ORAL
  Filled 2016-02-11 (×9): qty 1

## 2016-02-11 MED ORDER — ENOXAPARIN SODIUM 40 MG/0.4ML ~~LOC~~ SOLN
40.0000 mg | SUBCUTANEOUS | Status: DC
  Administered 2016-02-11 – 2016-02-14 (×4): 40 mg via SUBCUTANEOUS
  Filled 2016-02-11 (×5): qty 0.4

## 2016-02-11 MED ORDER — PANTOPRAZOLE SODIUM 40 MG PO TBEC
40.0000 mg | DELAYED_RELEASE_TABLET | Freq: Every day | ORAL | Status: DC
  Administered 2016-02-12 – 2016-02-15 (×4): 40 mg via ORAL
  Filled 2016-02-11 (×4): qty 1

## 2016-02-11 MED ORDER — ONDANSETRON HCL 4 MG PO TABS: 4.0000 mg | ORAL_TABLET | Freq: Four times a day (QID) | ORAL | Status: DC | PRN

## 2016-02-11 MED ORDER — TEMAZEPAM 15 MG PO CAPS
30.0000 mg | ORAL_CAPSULE | Freq: Every day | ORAL | Status: DC
  Administered 2016-02-11 – 2016-02-14 (×4): 30 mg via ORAL
  Filled 2016-02-11 (×4): qty 2

## 2016-02-11 MED ORDER — LORAZEPAM 2 MG/ML IJ SOLN: 1.0000 mg | Freq: Four times a day (QID) | INTRAMUSCULAR | Status: DC | PRN

## 2016-02-11 MED ORDER — SODIUM CHLORIDE 0.9 % IV SOLN
INTRAVENOUS | Status: DC
  Administered 2016-02-11: 18:00:00 via INTRAVENOUS

## 2016-02-11 MED ORDER — METHYLPREDNISOLONE SODIUM SUCC 125 MG IJ SOLR
80.0000 mg | Freq: Four times a day (QID) | INTRAMUSCULAR | Status: DC
  Administered 2016-02-11 – 2016-02-12 (×2): 80 mg via INTRAVENOUS
  Filled 2016-02-11 (×2): qty 2

## 2016-02-11 MED ORDER — ROSUVASTATIN CALCIUM 10 MG PO TABS
10.0000 mg | ORAL_TABLET | Freq: Every day | ORAL | Status: DC
  Administered 2016-02-11 – 2016-02-15 (×5): 10 mg via ORAL
  Filled 2016-02-11 (×5): qty 1

## 2016-02-11 MED ORDER — FINASTERIDE 5 MG PO TABS
5.0000 mg | ORAL_TABLET | Freq: Every day | ORAL | Status: DC
  Administered 2016-02-12 – 2016-02-15 (×4): 5 mg via ORAL
  Filled 2016-02-11 (×4): qty 1

## 2016-02-11 MED ORDER — METHYLPREDNISOLONE SODIUM SUCC 125 MG IJ SOLR
125.0000 mg | Freq: Once | INTRAMUSCULAR | Status: AC
  Administered 2016-02-11: 125 mg via INTRAVENOUS
  Filled 2016-02-11: qty 2

## 2016-02-11 MED ORDER — CEFTRIAXONE SODIUM 1 G IJ SOLR
1.0000 g | INTRAMUSCULAR | Status: DC
  Administered 2016-02-11: 1 g via INTRAVENOUS
  Filled 2016-02-11: qty 10

## 2016-02-11 NOTE — ED Notes (Signed)
Pt stated he does not feel like putting on the gown at this time.

## 2016-02-11 NOTE — ED Notes (Signed)
Patient needs to be held down in the ED per Dr. Keturah Barre. Andrew [name] til he reassess patients condition to make sure patient is appropriate for the assigned floor.

## 2016-02-11 NOTE — H&P (Signed)
[facility] History and Physical  Junius Vien [id number] DOB: October 09, 1941 DOA: 02/11/2016  Referring physician: Lacretia Leigh, M.D. PCP: Lujean Amel, MD   Chief Complaint: Shortness of breath.  HPI: Andrew [name] is a 75 y.o. male with a past medical history hypertension, hyperlipidemia, pulmonary hypertension, COPD/emphysema who comes to the emergency department via EMS due to shortness of breath since yesterday.  Per patient, since yesterday he has been having worsening dyspnea with wheezing and occasionally productive cough, which does not respond to his rescue inhaler. Today he felt so this neck that he called EMS. When EMS arrived, the patient was in respiratory distress he was given supplemental oxygen, 5 mg albuterol nebulizer treatment and 125 mg of IV Solu-Medrol. He denies chest pain, palpitations, dizziness, diaphoresis, pitting edema lower extremities.  When seen in the emergency department, the patient was in respiratory distress and was upgraded from telemetry to the stepdown unit. Workup was significant for leukocytosis and a chest radiograph showing chronic emphysematous changes, but no acute infiltrates.   Review of Systems:  Constitutional:  Positive fatigue.  No weight loss, night sweats, Fevers, chills HEENT:  No headaches, Difficulty swallowing,Tooth/dental problems,Sore throat,  No sneezing, itching, ear ache, nasal congestion, post nasal drip,  Cardio-vascular:  No chest pain, Orthopnea, PND, swelling in lower extremities, anasarca, dizziness, palpitations  GI:  No heartburn, indigestion, abdominal pain, nausea, vomiting, diarrhea, change in bowel habits, loss of appetite  Resp:  Positive for dyspnea, wheezing, cough with occasional sputum production. No hemoptysis. Skin:  no rash or lesions.  GU:  no dysuria, change in color of urine, no urgency or frequency. No flank pain.  Musculoskeletal:  No joint pain or swelling. No decreased range of  motion. No back pain.  Psych:  No change in mood or affect. No depression or anxiety. No memory loss.   Past Medical History  Diagnosis Date  . Emphysema lung ([place])   . Hypertension   . Pulmonary hypertension [facility])    Past Surgical History  Procedure Laterality Date  . Tonsillectomy    . Lung surgery     Social History:  reports that he quit smoking about 31 years ago. His smoking use included Cigarettes. He smoked 0.00 packs per day for 0 years. He does not have any smokeless tobacco history on file. He reports that he does not drink alcohol or use illicit drugs.  No Known Allergies  Family History  Problem Relation Age of Onset  . Bone cancer Maternal Uncle     Prior to Admission medications   Medication Sig Start Date End Date Taking? Authorizing Provider  ADVAIR DISKUS 250-50 MCG/DOSE AEPB Inhale 1 puff into the lungs 2 (two) times daily. 11/01/15  Yes Noralee Space, MD  albuterol (PROVENTIL HFA;VENTOLIN HFA) 108 (90 Base) MCG/ACT inhaler Inhale 2 puffs into the lungs every 6 (six) hours as needed for wheezing or shortness of breath. 01/03/16  Yes Nita Sells, MD  Ascorbic Acid (VITAMIN C PO) Take 1 tablet by mouth daily.   Yes Historical Provider, MD  CALCIUM PO Take 1 tablet by mouth daily.   Yes Historical Provider, MD  dextromethorphan-guaiFENesin (MUCINEX DM) 30-600 MG 12hr tablet Take 1 tablet by mouth 2 (two) times daily. 01/03/16  Yes Nita Sells, MD  diltiazem (TIAZAC) 240 MG 24 hr capsule Take 240 mg by mouth daily.   Yes Historical Provider, MD  esomeprazole (NEXIUM) 20 MG capsule Take 20 mg by mouth 2 (two) times daily before a meal.  Yes Historical Provider, MD  finasteride (PROSCAR) 5 MG tablet Take 5 mg by mouth daily.    Yes Historical Provider, MD  Multiple Vitamins-Minerals (MULTIVITAMIN WITH MINERALS) tablet Take 1 tablet by mouth daily.   Yes Historical Provider, MD  mupirocin ointment (BACTROBAN) 2 % Apply 1 application topically 2 (two)  times daily. 01/18/16  Yes Historical Provider, MD  nystatin (MYCOSTATIN) 100000 UNIT/ML suspension Take 6 mLs by mouth 4 (four) times daily. Reported on 01/10/2016 01/08/16  Yes Historical Provider, MD  polyethylene glycol (MIRALAX / GLYCOLAX) packet Take 17 g by mouth at bedtime.   Yes Historical Provider, MD  rosuvastatin (CRESTOR) 10 MG tablet Take 10 mg by mouth daily.   Yes Historical Provider, MD  sildenafil (REVATIO) 20 MG tablet Take 20 mg by mouth 2 (two) times daily.    Yes Historical Provider, MD  SPIRIVA HANDIHALER 18 MCG inhalation capsule Place 1 capsule (18 mcg total) into inhaler and inhale daily. 11/01/15  Yes Noralee Space, MD  tamsulosin (FLOMAX) 0.4 MG CAPS capsule Take 1 capsule (0.4 mg total) by mouth daily after breakfast. 01/03/16  Yes Nita Sells, MD  temazepam (RESTORIL) 30 MG capsule Take 30 mg by mouth at bedtime.   Yes Historical Provider, MD  vitamin E 1000 UNIT capsule Take 1,000 Units by mouth daily.   Yes Historical Provider, MD  predniSONE (DELTASONE) 20 MG tablet Take 2 tablets (40 mg total) by mouth daily before breakfast. Patient not taking: Reported on 01/10/2016 01/04/16   Nita Sells, MD   Physical Exam: Filed Vitals:   02/11/16 1930 02/11/16 1942 02/11/16 2000 02/11/16 2031  BP: 132/72 132/75 128/75 131/74  Pulse: 109 111 115 112  Temp:      TempSrc:      Resp: 21 20 22 20   SpO2: 93% 93% 97% 96%    Wt Readings from Last 3 Encounters:  12/28/15 66.2 kg (145 lb 15.1 oz)  11/01/15 68.04 kg (150 lb)  10/02/15 70.398 kg (155 lb 3.2 oz)    General:  Appears calm and comfortable Eyes: PERRL, normal lids, irises & conjunctiva ENT: grossly normal hearing, lips & tongue Neck: no LAD, masses or thyromegaly Cardiovascular: Tachycardic, no m/r/g. No LE edema. Telemetry: Sinus tachycardia at 112 bpm. Respiratory: Positive accessory muscle use. Decreased breath sounds bilaterally with scattered wheezing and rhonchi. No crackles. Abdomen: soft,  ntnd Skin: no rash or induration seen on limited exam Musculoskeletal: grossly normal tone BUE/BLE Psychiatric: grossly normal mood and affect, speech fluent and appropriate Neurologic: Awake, alert, oriented 3, grossly non-focal.          Labs on Admission:  Basic Metabolic Panel:  Recent Labs Lab 02/11/16 1656  NA 139  K 4.3  CL 103  CO2 24  GLUCOSE 144*  BUN 12  CREATININE 0.86  CALCIUM 9.1  MG 1.9  PHOS 2.8   CBC:  Recent Labs Lab 02/11/16 1656  WBC 20.6*  NEUTROABS 19.5*  HGB 12.6*  HCT 36.8*  MCV 91.8  PLT 314    BNP (last 3 results)  Recent Labs  01/02/16 1318  BNP 60.0    Radiological Exams on Admission: Dg Chest 2 View  02/11/2016  CLINICAL DATA:  Shortness of breath, COPD, emphysema EXAM: CHEST  2 VIEW COMPARISON:  12/30/2015 FINDINGS: Cardiomediastinal silhouette is stable. Hyperinflation and emphysematous bullous changes again noted. Stable streaky scarring in right midlung and bilateral lower lobes. No definite superimposed infiltrate or pulmonary edema. IMPRESSION: Hyperinflation and emphysematous bullous changes again noted.  Stable streaky scarring in right midlung and bilateral lower lobes. No definite superimposed infiltrate or pulmonary edema. Electronically Signed   By: Lahoma Crocker M.D.   On: 02/11/2016 17:09  Echocardiogram 10/09/2015  ------------------------------------------------------------------- LV EF: 55% - 60%  ------------------------------------------------------------------- Indications: Dyspnea (R06.0).  ------------------------------------------------------------------- Study Conclusions  - Left ventricle: The cavity size was normal. Systolic function was  normal. The estimated ejection fraction was in the range of 55%  to 60%. Wall motion was normal; there were no regional wall  motion abnormalities. - Mitral valve: There was mild regurgitation. - Right atrium: The atrium was mildly dilated. - Tricuspid  valve: There was trivial regurgitation. - Pulmonary arteries: PA peak pressure: 36 mm Hg (S).  Impressions:  - The right ventricular systolic pressure was increased consistent  with mild pulmonary hypertension.  Assessment/Plan Principal Problem:   Acute respiratory insufficiency   COPD (chronic obstructive pulmonary disease) (HCC) Admit to the stepdown unit. Continue supplemental oxygen. Will start BiPAP ventilation to provide relief to the patient. The patient is on BiPAP at night at home. Continue bronchodilators. Continue glucocorticoids.    Leukocytosis Given the patient's significant respiratory symptomatology and drastic change in WBC, will cover with Rocephin and Zithromax IV.  Active Problems:   History of pulmonary hypertension Continue calcium channel blocker.     Hypertension Continue calcium channel blocker and monitor blood pressure periodically.    Hyperlipidemia Continue Crestor and monitor LFTs periodically.    Code Status: Full code. DVT Prophylaxis: Lovenox SQ. Family Communication:  Disposition Plan: Admit to a stepdown for close monitoring.   Time spent: Over 70 minutes were used in the presence admission.  Reubin Milan [facility] Pager [phone].

## 2016-02-11 NOTE — ED Notes (Signed)
Dr. Abundio Miu is changing his level of care to [facility]. Will await for bed assignment.

## 2016-02-11 NOTE — ED Provider Notes (Signed)
CSN: [id number]     Arrival date & time 02/11/16  1551 History   First MD Initiated Contact with Patient 02/11/16 1601     Chief Complaint  Patient presents with  . Shortness of Breath     (Consider location/radiation/quality/duration/timing/severity/associated sxs/prior Treatment) HPI Comments: Patient here with 24 hours a worsening shortness of breath. Has history of COPD and has been using his home inhalers without relief. Has had no fever but has had no productive cough. He chronically uses 2-3 L of oxygen at home. No vomiting or diarrhea. No recent URI symptoms. EMS was called and patient given one 5 mg treatment along with one treatment, Solu-Medrol and transported here. States he feels better after the breathing treatment.  Patient is a 75 y.o. male presenting with shortness of breath. The history is provided by the patient.  Shortness of Breath   Past Medical History  Diagnosis Date  . Emphysema lung ([place])   . Hypertension   . Pulmonary hypertension [facility])    Past Surgical History  Procedure Laterality Date  . Tonsillectomy    . Lung surgery     Family History  Problem Relation Age of Onset  . Bone cancer Maternal Uncle    Social History  Substance Use Topics  . Smoking status: Former Smoker -- 0.00 packs/day for 0 years    Types: Cigarettes    Quit date: 10/01/1984  . Smokeless tobacco: Not on file  . Alcohol Use: No    Review of Systems  Respiratory: Positive for shortness of breath.   All other systems reviewed and are negative.     Allergies  Review of patient's allergies indicates no known allergies.  Home Medications   Prior to Admission medications   Medication Sig Start Date End Date Taking? Authorizing Provider  ADVAIR DISKUS 250-50 MCG/DOSE AEPB Inhale 1 puff into the lungs 2 (two) times daily. 11/01/15   Noralee Space, MD  albuterol (PROVENTIL HFA;VENTOLIN HFA) 108 (90 Base) MCG/ACT inhaler Inhale 2 puffs into the lungs every 6 (six) hours as  needed for wheezing or shortness of breath. 01/03/16   Nita Sells, MD  CALCIUM PO Take 1 tablet by mouth daily.    Historical Provider, MD  dextromethorphan-guaiFENesin (MUCINEX DM) 30-600 MG 12hr tablet Take 1 tablet by mouth 2 (two) times daily. 01/03/16   Nita Sells, MD  diltiazem (TIAZAC) 240 MG 24 hr capsule Take 240 mg by mouth daily.    Historical Provider, MD  esomeprazole (NEXIUM) 20 MG capsule Take 20 mg by mouth 2 (two) times daily before a meal.     Historical Provider, MD  finasteride (PROSCAR) 5 MG tablet Take 5 mg by mouth daily.     Historical Provider, MD  magic mouthwash SOLN Take 5-10 mLs by mouth every 4 (four) hours as needed for mouth pain. Gargle and spit 01/08/16   Historical Provider, MD  Multiple Vitamins-Minerals (MULTIVITAMIN WITH MINERALS) tablet Take 1 tablet by mouth daily.    Historical Provider, MD  nystatin (MYCOSTATIN) 100000 UNIT/ML suspension Take 6 mLs by mouth 4 (four) times daily. Reported on 01/10/2016 01/08/16   Historical Provider, MD  polyethylene glycol (MIRALAX / GLYCOLAX) packet Take 17 g by mouth at bedtime.    Historical Provider, MD  predniSONE (DELTASONE) 20 MG tablet Take 2 tablets (40 mg total) by mouth daily before breakfast. Patient not taking: Reported on 01/10/2016 01/04/16   Nita Sells, MD  rosuvastatin (CRESTOR) 10 MG tablet Take 10 mg by mouth daily.  Historical Provider, MD  sildenafil (REVATIO) 20 MG tablet Take 20 mg by mouth 2 (two) times daily.     Historical Provider, MD  SPIRIVA HANDIHALER 18 MCG inhalation capsule Place 1 capsule (18 mcg total) into inhaler and inhale daily. 11/01/15   Noralee Space, MD  tamsulosin (FLOMAX) 0.4 MG CAPS capsule Take 1 capsule (0.4 mg total) by mouth daily after breakfast. 01/03/16   Nita Sells, MD  temazepam (RESTORIL) 30 MG capsule Take 30 mg by mouth at bedtime.    Historical Provider, MD  vitamin E 1000 UNIT capsule Take 1,000 Units by mouth daily.    Historical  Provider, MD   BP 152/63 mmHg  Pulse 112  Temp(Src) 98.1 F (36.7 C) (Oral)  Resp 22  SpO2 100% Physical Exam  Constitutional: He is oriented to person, place, and time. He appears well-developed and well-nourished.  Non-toxic appearance. No distress.  HENT:  Head: Normocephalic and atraumatic.  Eyes: Conjunctivae, EOM and lids are normal. Pupils are equal, round, and reactive to light.  Neck: Normal range of motion. Neck supple. No tracheal deviation present. No thyroid mass present.  Cardiovascular: Regular rhythm and normal heart sounds.  Tachycardia present.  Exam reveals no gallop.   No murmur heard. Pulmonary/Chest: Effort normal. No stridor. No respiratory distress. He has decreased breath sounds. He has wheezes. He has no rhonchi. He has no rales.  Abdominal: Soft. Normal appearance and bowel sounds are normal. He exhibits no distension. There is no tenderness. There is no rebound and no CVA tenderness.  Musculoskeletal: Normal range of motion. He exhibits no edema or tenderness.  Neurological: He is alert and oriented to person, place, and time. He has normal strength. No cranial nerve deficit or sensory deficit. GCS eye subscore is 4. GCS verbal subscore is 5. GCS motor subscore is 6.  Skin: Skin is warm and dry. No abrasion and no rash noted.  Psychiatric: He has a normal mood and affect. His speech is normal and behavior is normal.  Nursing note and vitals reviewed.   ED Course  Procedures (including critical care time) Labs Review Labs Reviewed - No data to display  Imaging Review No results found. I have personally reviewed and evaluated these images and lab results as part of my medical decision-making.   EKG Interpretation None      MDM   Final diagnoses:  None    Patient given albuterol treatment and continues to have wheezing. Also given magnesium and is still not improved. Will admit to the hospital    Lacretia Leigh, MD 02/11/16 [phone]

## 2016-02-11 NOTE — ED Notes (Signed)
Pt placed on 3 liters nasal canula per Dr. Zenia Resides

## 2016-02-11 NOTE — ED Notes (Signed)
Hospitalist at bedside 

## 2016-02-11 NOTE — ED Notes (Signed)
Pt from home via EMS c/o SOB x1 day. Pt has hx of COPD and  reports that he used inhaler w/o relief. Pt was given 1 neb (5 Albuterol) and 125 solumedrol en route with EMS. Pt is A&O.

## 2016-02-12 ENCOUNTER — Inpatient Hospital Stay (HOSPITAL_COMMUNITY): Payer: Medicare Other

## 2016-02-12 DIAGNOSIS — J9621 Acute and chronic respiratory failure with hypoxia: Secondary | ICD-10-CM

## 2016-02-12 DIAGNOSIS — J9601 Acute respiratory failure with hypoxia: Secondary | ICD-10-CM

## 2016-02-12 DIAGNOSIS — J438 Other emphysema: Secondary | ICD-10-CM

## 2016-02-12 LAB — CBC WITH DIFFERENTIAL/PLATELET
BASOS ABS: 0 10*3/uL (ref 0.0–0.1)
BASOS PCT: 0 %
EOS ABS: 0 10*3/uL (ref 0.0–0.7)
EOS PCT: 0 %
HCT: 34.3 % — ABNORMAL LOW (ref 39.0–52.0)
Hemoglobin: 11.8 g/dL — ABNORMAL LOW (ref 13.0–17.0)
Lymphocytes Relative: 2 %
Lymphs Abs: 0.4 10*3/uL — ABNORMAL LOW (ref 0.7–4.0)
MCH: 31.4 pg (ref 26.0–34.0)
MCHC: 34.4 g/dL (ref 30.0–36.0)
MCV: 91.2 fL (ref 78.0–100.0)
MONO ABS: 0.3 10*3/uL (ref 0.1–1.0)
Monocytes Relative: 1 %
Neutro Abs: 19.7 10*3/uL — ABNORMAL HIGH (ref 1.7–7.7)
Neutrophils Relative %: 97 %
PLATELETS: 304 10*3/uL (ref 150–400)
RBC: 3.76 MIL/uL — ABNORMAL LOW (ref 4.22–5.81)
RDW: 12.6 % (ref 11.5–15.5)
WBC: 20.3 10*3/uL — ABNORMAL HIGH (ref 4.0–10.5)

## 2016-02-12 LAB — COMPREHENSIVE METABOLIC PANEL
ALBUMIN: 3.4 g/dL — AB (ref 3.5–5.0)
ALT: 18 U/L (ref 17–63)
AST: 14 U/L — AB (ref 15–41)
Alkaline Phosphatase: 72 U/L (ref 38–126)
Anion gap: 10 (ref 5–15)
BUN: 12 mg/dL (ref 6–20)
CHLORIDE: 104 mmol/L (ref 101–111)
CO2: 25 mmol/L (ref 22–32)
Calcium: 8.8 mg/dL — ABNORMAL LOW (ref 8.9–10.3)
Creatinine, Ser: 0.66 mg/dL (ref 0.61–1.24)
GFR calc Af Amer: >60 mL/min (ref >60)
GLUCOSE: 199 mg/dL — AB (ref 65–99)
POTASSIUM: 4 mmol/L (ref 3.5–5.1)
SODIUM: 139 mmol/L (ref 135–145)
Total Bilirubin: 0.6 mg/dL (ref 0.3–1.2)
Total Protein: 6.6 g/dL (ref 6.5–8.1)

## 2016-02-12 MED ORDER — METHYLPREDNISOLONE SODIUM SUCC 40 MG IJ SOLR
40.0000 mg | Freq: Two times a day (BID) | INTRAMUSCULAR | Status: DC
  Administered 2016-02-12 – 2016-02-13 (×2): 40 mg via INTRAVENOUS
  Filled 2016-02-12 (×2): qty 1

## 2016-02-12 MED ORDER — AZITHROMYCIN 500 MG PO TABS
500.0000 mg | ORAL_TABLET | Freq: Every day | ORAL | Status: DC
  Administered 2016-02-12 – 2016-02-15 (×4): 500 mg via ORAL
  Filled 2016-02-12 (×2): qty 2
  Filled 2016-02-12 (×2): qty 1

## 2016-02-12 NOTE — Care Management Note (Signed)
Case Management Note  Patient Details  Name: Andrew [name] MRN: [id number] Date of Birth: 05/12/1941  Subjective/Objective:    copd                Action/Plan:Date:  February 12, 2016 Chart reviewed for concurrent status and case management needs. Will continue to follow patient for changes and needs: Velva Harman, BSN, RN, [place]   [phone]   Expected Discharge Date:                  Expected Discharge Plan:  Home/Self Care  In-House Referral:  NA  Discharge planning Services  CM Consult  Post Acute Care Choice:  NA Choice offered to:  NA  DME Arranged:    DME Agency:     HH Arranged:    [place] Agency:     Status of Service:  Completed, signed off  Medicare Important Message Given:    Date Medicare IM Given:    Medicare IM give by:    Date Additional Medicare IM Given:    Additional Medicare Important Message give by:     If discussed at [place] of Stay Meetings, dates discussed:    Additional Comments:  Leeroy Cha, RN 02/12/2016, 9:25 AM

## 2016-02-12 NOTE — Progress Notes (Signed)
[facility] Progress Note   Andrew [name]  [id number]  DOB: Apr 01, 1941  DOA: 02/11/2016 PCP: Lujean Amel, MD  Brief narrative: Andrew [name] is a 75 y.o. 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 Found to have wheezing, productive cough. Not improving with rescue inhaler. Admitted for COPD exacerbation.  Recently admitted for community acquired pneumonia from 2/9 through 2/15   Subjective: Dyspnea and cough improving.   Assessment/Plan: Principal Problem:   Acute respiratory failure with hypoxia/ COPD exacerbation/   Leukocytosis - CXR x 2 negative for pneumonia- stop Rocephin- cont Zithromax to cover for acute bronchitiis  - taper solumedrol to Q 12 - cont Duonebs - wean O2  Active Problems:    History of pulmonary hypertension  - on Revatio     Hypertension - Diltiazem    BPH - Flomax, Proscar    Antibiotics: Anti-infectives    Start     Dose/Rate Route Frequency Ordered Stop   02/11/16 2200  cefTRIAXone (ROCEPHIN) 1 g in dextrose 5 % 50 mL IVPB     1 g 100 mL/hr over 30 Minutes Intravenous Every 24 hours 02/11/16 2135     02/11/16 2200  azithromycin (ZITHROMAX) 500 mg in dextrose 5 % 250 mL IVPB     500 mg 250 mL/hr over 60 Minutes Intravenous Every 24 hours 02/11/16 2135       Code Status:     Code Status Orders        Start     Ordered   02/11/16 2136  Full code   Continuous     02/11/16 2135    Code Status History    Date Active Date Inactive Code Status Order ID Comments User Context   12/28/2015  3:18 AM 01/03/2016  5:39 PM DNR [id number]  Etta Quill, DO ED   12/28/2015  3:17 AM 12/28/2015  3:18 AM Full Code [id number]  Etta Quill, DO ED     Family Communication:   Disposition Plan: home in 1-2 days DVT prophylaxis:  Lovenox Consultants:  Procedures:     Objective: Filed Weights   02/12/16 0000  Weight: 58.4 kg (128 lb 12 oz)    Intake/Output Summary (Last 24 hours) at 02/12/16 1205 Last data filed at 02/12/16 1100  Gross per 24 hour  Intake    866 ml  Output   1300 ml  Net   -434 ml     Vitals Filed Vitals:   02/12/16 0400 02/12/16 0500 02/12/16 0600 02/12/16 0739  BP: 125/63  127/55   Pulse: 81 84 83   Temp: 97.4 F (36.3 C)   97.7 F (36.5 C)  TempSrc: Axillary   Axillary  Resp: 15 16 13    Height:      Weight:      SpO2: 98% 98% 99%     Exam:  General:  Pt is alert, not in acute distress  HEENT: No icterus, No thrush, oral mucosa moist  Cardiovascular: regular rate and rhythm, S1/S2 No murmur  Respiratory: rhonchi, congestion, no wheeze or crackles.   Abdomen: Soft, +Bowel sounds, non tender, non distended, no guarding  MSK: No cyanosis or clubbing- no pedal edema   Data Reviewed: Basic Metabolic Panel:  Recent Labs Lab 02/11/16 1656 02/12/16 0327  NA 139 139  K 4.3 4.0  CL 103 104  CO2 24  25  GLUCOSE 144* 199*  BUN 12 12  CREATININE 0.86 0.66  CALCIUM 9.1 8.8*  MG 1.9  --   PHOS 2.8  --    Liver Function Tests:  Recent Labs Lab 02/12/16 0327  AST 14*  ALT 18  ALKPHOS 72  BILITOT 0.6  PROT 6.6  ALBUMIN 3.4*   No results for input(s): LIPASE, AMYLASE in the last 168 hours. No results for input(s): AMMONIA in the last 168 hours. CBC:  Recent Labs Lab 02/11/16 1656 02/12/16 0327  WBC 20.6* 20.3*  NEUTROABS 19.5* 19.7*  HGB 12.6* 11.8*  HCT 36.8* 34.3*  MCV 91.8 91.2  PLT 314 304   Cardiac Enzymes: No results for input(s): CKTOTAL, CKMB, CKMBINDEX, TROPONINI in the last 168 hours. BNP (last 3 results)  Recent Labs  01/02/16 1318  BNP 60.0    ProBNP (last 3 results) No results for input(s): PROBNP in the last 8760 hours.  CBG: No results for input(s): GLUCAP in the last 168 hours.  Recent Results (from the past 240  hour(s))  MRSA PCR Screening     Status: None   Collection Time: 02/11/16  9:38 PM  Result Value Ref Range Status   MRSA by PCR NEGATIVE NEGATIVE Final    Comment:        The GeneXpert MRSA Assay (FDA approved for NASAL specimens only), is one component of a comprehensive MRSA colonization surveillance program. It is not intended to diagnose MRSA infection nor to guide or monitor treatment for MRSA infections.      Studies: Dg Chest 2 View  02/11/2016  CLINICAL DATA:  Shortness of breath, COPD, emphysema EXAM: CHEST  2 VIEW COMPARISON:  12/30/2015 FINDINGS: Cardiomediastinal silhouette is stable. Hyperinflation and emphysematous bullous changes again noted. Stable streaky scarring in right midlung and bilateral lower lobes. No definite superimposed infiltrate or pulmonary edema. IMPRESSION: Hyperinflation and emphysematous bullous changes again noted. Stable streaky scarring in right midlung and bilateral lower lobes. No definite superimposed infiltrate or pulmonary edema. Electronically Signed   By: Lahoma Crocker M.D.   On: 02/11/2016 17:09   Dg Chest Port 1 View  02/12/2016  CLINICAL DATA:  Hypoxia, emphysema EXAM: PORTABLE CHEST 1 VIEW COMPARISON:  02/11/2016 FINDINGS: Hyperinflation and severe bullous emphysematous changes within the lungs. Stable scarring in the right mid lung and lung bases. No acute airspace opacities. Heart is normal size. No effusions or acute bony abnormality. 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 No active disease. Electronically Signed   By: Rolm Baptise M.D.   On: 02/12/2016 09:06    Scheduled Meds:  Scheduled Meds: . azithromycin  500 mg Intravenous Q24H  . cefTRIAXone (ROCEPHIN)  IV  1 g Intravenous Q24H  . dextromethorphan-guaiFENesin  1 tablet Oral BID  . diltiazem  240 mg Oral Daily  . enoxaparin (LOVENOX) injection  40 mg Subcutaneous Q24H  . finasteride  5 mg Oral Daily  . ipratropium-albuterol  3 mL Nebulization Q6H   . methylPREDNISolone (SOLU-MEDROL) injection  40 mg Intravenous Q12H  . pantoprazole  40 mg Oral Daily  . polyethylene glycol  17 g Oral QHS  . rosuvastatin  10 mg Oral Daily  . sildenafil  20 mg Oral BID  . sodium chloride flush  3 mL Intravenous Q12H  . tamsulosin  0.4 mg Oral QPC breakfast  . temazepam  30 mg Oral QHS   Continuous Infusions: . sodium chloride 50 mL/hr at 02/11/16 2157    Time spent on care of  this patient: 56 min   [place], MD 02/12/2016, 12:05 PM  LOS: 1 day   [facility] Office  [phone] Pager - Text Page per [email or web address] If 7PM-7AM, please contact night-coverage [email or web address]

## 2016-02-13 ENCOUNTER — Inpatient Hospital Stay: Payer: Medicare Other | Admitting: Pulmonary Disease

## 2016-02-13 DIAGNOSIS — J441 Chronic obstructive pulmonary disease with (acute) exacerbation: Secondary | ICD-10-CM

## 2016-02-13 LAB — CBC WITH DIFFERENTIAL/PLATELET
BASOS ABS: 0 10*3/uL (ref 0.0–0.1)
BASOS PCT: 0 %
EOS ABS: 0 10*3/uL (ref 0.0–0.7)
EOS PCT: 0 %
HCT: 31.3 % — ABNORMAL LOW (ref 39.0–52.0)
HEMOGLOBIN: 10.4 g/dL — AB (ref 13.0–17.0)
LYMPHS ABS: 0.4 10*3/uL — AB (ref 0.7–4.0)
Lymphocytes Relative: 2 %
MCH: 30.4 pg (ref 26.0–34.0)
MCHC: 33.2 g/dL (ref 30.0–36.0)
MCV: 91.5 fL (ref 78.0–100.0)
Monocytes Absolute: 0.5 10*3/uL (ref 0.1–1.0)
Monocytes Relative: 3 %
NEUTROS PCT: 95 %
Neutro Abs: 18.2 10*3/uL — ABNORMAL HIGH (ref 1.7–7.7)
PLATELETS: 281 10*3/uL (ref 150–400)
RBC: 3.42 MIL/uL — AB (ref 4.22–5.81)
RDW: 12.8 % (ref 11.5–15.5)
WBC: 19.1 10*3/uL — AB (ref 4.0–10.5)

## 2016-02-13 LAB — BASIC METABOLIC PANEL
ANION GAP: 8 (ref 5–15)
BUN: 19 mg/dL (ref 6–20)
CHLORIDE: 107 mmol/L (ref 101–111)
CO2: 25 mmol/L (ref 22–32)
Calcium: 8.8 mg/dL — ABNORMAL LOW (ref 8.9–10.3)
Creatinine, Ser: 0.69 mg/dL (ref 0.61–1.24)
Glucose, Bld: 166 mg/dL — ABNORMAL HIGH (ref 65–99)
POTASSIUM: 4.4 mmol/L (ref 3.5–5.1)
SODIUM: 140 mmol/L (ref 135–145)

## 2016-02-13 MED ORDER — METHYLPREDNISOLONE SODIUM SUCC 40 MG IJ SOLR: 40.0000 mg | Freq: Every day | INTRAMUSCULAR | Status: DC

## 2016-02-13 MED ORDER — PREDNISONE 50 MG PO TABS
60.0000 mg | ORAL_TABLET | Freq: Every day | ORAL | Status: DC
  Administered 2016-02-14 – 2016-02-15 (×2): 60 mg via ORAL
  Filled 2016-02-13 (×3): qty 1

## 2016-02-13 MED ORDER — MENTHOL 3 MG MT LOZG: 1.0000 | LOZENGE | OROMUCOSAL | Status: DC | PRN

## 2016-02-13 NOTE — Progress Notes (Addendum)
[facility] Progress Note   Andrew [name]  [id number]  DOB: 03-28-1941  DOA: 02/11/2016 PCP: Lujean Amel, MD  Brief narrative: Andrew [name] is a 75 y.o. 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 Found to have wheezing with productive cough. Not improving with rescue inhaler. Admitted for COPD exacerbation.  Recently admitted for community acquired pneumonia from 2/9 through 2/15   Subjective: Cough nearly resolved but very short or breath with walking in hall today.   Assessment/Plan: Principal Problem:   Acute respiratory failure with hypoxia/ COPD exacerbation/   Leukocytosis - CXR x 2 negative for pneumonia- stopped Rocephin- cont Zithromax to cover for acute bronchitiis  - tapered solumedrol to daily - transiton to Prednisone tomorrow - cont Duonebs - wean O2  Active Problems:    History of pulmonary hypertension  - on Revatio     Hypertension - Diltiazem    BPH - Flomax, Proscar  OSA - BiPAP at bedtime    Antibiotics: Anti-infectives    Start     Dose/Rate Route Frequency Ordered Stop   02/12/16 1300  azithromycin (ZITHROMAX) tablet 500 mg     500 mg Oral Daily 02/12/16 1209     02/11/16 2200  cefTRIAXone (ROCEPHIN) 1 g in dextrose 5 % 50 mL IVPB  Status:  Discontinued     1 g 100 mL/hr over 30 Minutes Intravenous Every 24 hours 02/11/16 2135 02/12/16 1208   02/11/16 2200  azithromycin (ZITHROMAX) 500 mg in dextrose 5 % 250 mL IVPB     500 mg 250 mL/hr over 60 Minutes Intravenous Every 24 hours 02/11/16 2135       Code Status:     Code Status Orders        Start     Ordered   02/11/16 2136  Full code   Continuous     02/11/16 2135    Code Status History    Date Active Date Inactive Code Status Order ID Comments User Context   12/28/2015   3:18 AM 01/03/2016  5:39 PM DNR [id number]  Andrew Quill, DO ED   12/28/2015  3:17 AM 12/28/2015  3:18 AM Full Code [id number]  Andrew Quill, DO ED     Family Communication:   Disposition Plan: possible SNF- PT eval- tx out of SDU to med surg today  DVT prophylaxis: Lovenox Consultants:  Procedures:     Objective: Filed Weights   02/12/16 0000  Weight: 58.4 kg (128 lb 12 oz)    Intake/Output Summary (Last 24 hours) at 02/13/16 1555 Last data filed at 02/13/16 1215  Gross per 24 hour  Intake    500 ml  Output    550 ml  Net    -50 ml     Vitals Filed Vitals:   02/13/16 0900 02/13/16 1000 02/13/16 1134 02/13/16 1414  BP: 113/41 115/44 124/60   Pulse: 78 84 79   Temp:      TempSrc:      Resp: 13 20 20    Height:      Weight:      SpO2: 95% 94% 97% 98%    Exam:  General:  Pt is alert, not in acute distress  HEENT: No icterus, No thrush, oral mucosa moist  Cardiovascular: regular rate and rhythm, S1/S2 No murmur  Respiratory: clear to auscultation bilaterally today  Abdomen: Soft, +Bowel sounds, non tender, non distended, no guarding  MSK: No cyanosis or clubbing- no pedal edema   Data Reviewed: Basic Metabolic Panel:  Recent Labs Lab 02/11/16 1656 02/12/16 0327 02/13/16 0330  NA 139 139 140  K 4.3 4.0 4.4  CL 103 104 107  CO2 24 25 25   GLUCOSE 144* 199* 166*  BUN 12 12 19   CREATININE 0.86 0.66 0.69  CALCIUM 9.1 8.8* 8.8*  MG 1.9  --   --   PHOS 2.8  --   --    Liver Function Tests:  Recent Labs Lab 02/12/16 0327  AST 14*  ALT 18  ALKPHOS 72  BILITOT 0.6  PROT 6.6  ALBUMIN 3.4*   No results for input(s): LIPASE, AMYLASE in the last 168 hours. No results for input(s): AMMONIA in the last 168 hours. CBC:  Recent Labs Lab 02/11/16 1656 02/12/16 0327 02/13/16 0330  WBC 20.6* 20.3* 19.1*  NEUTROABS 19.5* 19.7* 18.2*  HGB 12.6* 11.8* 10.4*  HCT 36.8* 34.3* 31.3*  MCV 91.8 91.2 91.5  PLT 314 304 281   Cardiac Enzymes: No results  for input(s): CKTOTAL, CKMB, CKMBINDEX, TROPONINI in the last 168 hours. BNP (last 3 results)  Recent Labs  01/02/16 1318  BNP 60.0    ProBNP (last 3 results) No results for input(s): PROBNP in the last 8760 hours.  CBG: No results for input(s): GLUCAP in the last 168 hours.  Recent Results (from the past 240 hour(s))  MRSA PCR Screening     Status: None   Collection Time: 02/11/16  9:38 PM  Result Value Ref Range Status   MRSA by PCR NEGATIVE NEGATIVE Final    Comment:        The GeneXpert MRSA Assay (FDA approved for NASAL specimens only), is one component of a comprehensive MRSA colonization surveillance program. It is not intended to diagnose MRSA infection nor to guide or monitor treatment for MRSA infections.      Studies: Dg Chest 2 View  02/11/2016  CLINICAL DATA:  Shortness of breath, COPD, emphysema EXAM: CHEST  2 VIEW COMPARISON:  12/30/2015 FINDINGS: Cardiomediastinal silhouette is stable. Hyperinflation and emphysematous bullous changes again noted. Stable streaky scarring in right midlung and bilateral lower lobes. No definite superimposed infiltrate or pulmonary edema. IMPRESSION: Hyperinflation and emphysematous bullous changes again noted. Stable streaky scarring in right midlung and bilateral lower lobes. No definite superimposed infiltrate or pulmonary edema. Electronically Signed   By: Lahoma Crocker M.D.   On: 02/11/2016 17:09   Dg Chest Port 1 View  02/12/2016  CLINICAL DATA:  Hypoxia, emphysema EXAM: PORTABLE CHEST 1 VIEW COMPARISON:  02/11/2016 FINDINGS: Hyperinflation and severe bullous emphysematous changes within the lungs. Stable scarring in the right mid lung and lung bases. No acute airspace opacities. Heart is normal size. No effusions or acute bony abnormality. 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 No active disease. Electronically Signed   By: Rolm Baptise M.D.   On: 02/12/2016 09:06    Scheduled Meds:  Scheduled  Meds: . azithromycin  500 mg Intravenous Q24H  . azithromycin  500 mg Oral Daily  . dextromethorphan-guaiFENesin  1 tablet Oral BID  . diltiazem  240 mg Oral Daily  . enoxaparin (LOVENOX) injection  40 mg Subcutaneous Q24H  . finasteride  5 mg Oral Daily  . ipratropium-albuterol  3 mL Nebulization Q6H  . pantoprazole  40 mg Oral Daily  . polyethylene glycol  17 g Oral QHS  . [START  ON 02/14/2016] predniSONE  60 mg Oral Q breakfast  . rosuvastatin  10 mg Oral Daily  . sildenafil  20 mg Oral BID  . sodium chloride flush  3 mL Intravenous Q12H  . tamsulosin  0.4 mg Oral QPC breakfast  . temazepam  30 mg Oral QHS   Continuous Infusions:    Time spent on care of this patient: 35 min   [place], MD 02/13/2016, 3:55 PM  LOS: 2 days   [facility] Office  [phone] Pager - Text Page per [email or web address] If 7PM-7AM, please contact night-coverage [email or web address]

## 2016-02-13 NOTE — Progress Notes (Signed)
Pt transferred from ICU to 1514. Pt AO x 4. Pt made aware of unit procedures and when to call for assitance to get out of bed. Pt has no questions or concerns at this time. Pt belonings are at bedside. Report received from Ginger, RN  Novella Olive, RN

## 2016-02-13 NOTE — Progress Notes (Signed)
[facility]  Patient Status: Active (receiving services up to time of hospitalization)  [facility] is providing the following services: RN, PT, OT and HHA  If patient discharges after hours, please call [phone].   Andrew [name] 02/13/2016, 12:21 PM

## 2016-02-14 DIAGNOSIS — R0689 Other abnormalities of breathing: Secondary | ICD-10-CM

## 2016-02-14 DIAGNOSIS — E785 Hyperlipidemia, unspecified: Secondary | ICD-10-CM

## 2016-02-14 DIAGNOSIS — I10 Essential (primary) hypertension: Secondary | ICD-10-CM

## 2016-02-14 DIAGNOSIS — J9601 Acute respiratory failure with hypoxia: Secondary | ICD-10-CM

## 2016-02-14 DIAGNOSIS — D72829 Elevated white blood cell count, unspecified: Secondary | ICD-10-CM

## 2016-02-14 DIAGNOSIS — J441 Chronic obstructive pulmonary disease with (acute) exacerbation: Principal | ICD-10-CM

## 2016-02-14 DIAGNOSIS — Z8679 Personal history of other diseases of the circulatory system: Secondary | ICD-10-CM

## 2016-02-14 MED ORDER — IPRATROPIUM-ALBUTEROL 0.5-2.5 (3) MG/3ML IN SOLN
3.0000 mL | Freq: Three times a day (TID) | RESPIRATORY_TRACT | Status: DC
  Administered 2016-02-15 (×2): 3 mL via RESPIRATORY_TRACT
  Filled 2016-02-14 (×2): qty 3

## 2016-02-14 NOTE — Evaluation (Signed)
Physical Therapy Evaluation Patient Details Name: Andrew [name] MRN: [id number] DOB: Dec 19, 1940 Today's Date: 02/14/2016   History of Present Illness  75 y.o. male with a past medical history hypertension, hyperlipidemia, pulmonary hypertension, COPD/emphysema chronically on 2L O2 during the day and 3L at night, s/p right thoracotomy and blebectomy and admitted COPD exacerbation  Clinical Impression  Pt admitted with above diagnosis. Pt currently with functional limitations due to the deficits listed below (see PT Problem List).  Pt will benefit from skilled PT to increase their independence and safety with mobility to allow discharge to the venue listed below.   Pt reports recent admission one month ago for pneumonia and states he plans to d/c to SNF for rehab prior to return home.     Follow Up Recommendations SNF    Equipment Recommendations  None recommended by PT    Recommendations for Other Services       Precautions / Restrictions Precautions Precautions: Fall Precaution Comments: chronic oxygen      Mobility  Bed Mobility Overal bed mobility: Modified Independent                Transfers Overall transfer level: Needs assistance Equipment used: None Transfers: Sit to/from Stand Sit to Stand: Min guard         General transfer comment: min/guard for safety  Ambulation/Gait Ambulation/Gait assistance: Min guard Ambulation Distance (Feet): 200 Feet Assistive device:  (pushing IV pole) Gait Pattern/deviations: Step-through pattern;Decreased stride length     General Gait Details: pt pushed IV pole for a little support, ambulated on 4L O2 [place] with SpO2 93%  Stairs            Wheelchair Mobility    Modified Rankin (Stroke Patients Only)       Balance                                             Pertinent Vitals/Pain Pain Assessment: No/denies pain    Home Living Family/patient expects to be discharged to:: Private  residence Living Arrangements: Children Available Help at Discharge: Family Type of Home: House Home Access: Level entry     Home Layout: One level Home Equipment: None      Prior Function Level of Independence: Independent         Comments: Chronic 2LO2 during day and 3L at night     Hand Dominance        Extremity/Trunk Assessment               Lower Extremity Assessment: Generalized weakness         Communication   Communication: No difficulties  Cognition Arousal/Alertness: Awake/alert Behavior During Therapy: WFL for tasks assessed/performed Overall Cognitive Status: Within Functional Limits for tasks assessed                      General Comments      Exercises        Assessment/Plan    PT Assessment Patient needs continued PT services  PT Diagnosis Difficulty walking   PT Problem List Decreased strength;Decreased activity tolerance;Decreased mobility;Cardiopulmonary status limiting activity  PT Treatment Interventions DME instruction;Gait training;Functional mobility training;Patient/family education;Therapeutic activities;Therapeutic exercise   PT Goals (Current goals can be found in the Care Plan section) Acute Rehab PT Goals PT Goal Formulation: With patient Time For Goal Achievement: 02/21/16 Potential to Achieve  Goals: Good    Frequency Min 3X/week   Barriers to discharge        Co-evaluation               End of Session Equipment Utilized During Treatment: Oxygen;Gait belt Activity Tolerance: Patient tolerated treatment well Patient left: in chair;with call bell/phone within reach           Time: 1243-1256 PT Time Calculation (min) (ACUTE ONLY): 13 min   Charges:   PT Evaluation $PT Eval Low Complexity: 1 Procedure     PT G Codes:        Rayshaun Needle,KATHrine E 02/14/2016, 2:42 PM Carmelia Bake, PT, DPT 02/14/2016 Pager: [phone]

## 2016-02-14 NOTE — Care Management Important Message (Signed)
Important Message  Patient Details  Name: Andrew [name] MRN: [id number] Date of Birth: 1941-07-25   Medicare Important Message Given:  Yes    Camillo Flaming 02/14/2016, 9:09 AMImportant Message  Patient Details  Name: Andrew [name] MRN: [id number] Date of Birth: 1941/01/30   Medicare Important Message Given:  Yes    Camillo Flaming 02/14/2016, 9:09 AM

## 2016-02-14 NOTE — Progress Notes (Signed)
[facility] Progress Note   Andrew [name]  [id number]  DOB: Jan 16, 1941  DOA: 02/11/2016 PCP: Lujean Amel, MD  Brief narrative: Andrew [name] is a 75 y.o. 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 Found to have wheezing with productive cough. Not improving with rescue inhaler. Admitted for COPD exacerbation.  Recently admitted for community acquired pneumonia from 2/9 through 2/15  Subjective: Feels better, improved.  Assessment/Plan:    Acute COPD exacerbation - CXR x 2 negative for pneumonia- stopped Rocephin- cont Zithromax to cover for acute bronchitiis  - tapered solumedrol to daily, currently on prednisone. - Continue supportive management with bronchodilators, mucolytics, antitussives and oxygen as needed.  Chronic hypoxic respiratory failure Chronically on oxygen, continue to wean off of oxygen and keep oxygen saturation above 90%.    History of pulmonary hypertension  - on Revatio     Hypertension - Diltiazem    BPH - Flomax, Proscar  OSA - BiPAP at bedtime  Debility Seen by PT/OT and recommended SNF, CSW to evaluate for SNF placement.  Antibiotics: Anti-infectives    Start     Dose/Rate Route Frequency Ordered Stop   02/12/16 1300  azithromycin (ZITHROMAX) tablet 500 mg     500 mg Oral Daily 02/12/16 1209     02/11/16 2200  cefTRIAXone (ROCEPHIN) 1 g in dextrose 5 % 50 mL IVPB  Status:  Discontinued     1 g 100 mL/hr over 30 Minutes Intravenous Every 24 hours 02/11/16 2135 02/12/16 1208   02/11/16 2200  azithromycin (ZITHROMAX) 500 mg in dextrose 5 % 250 mL IVPB  Status:  Discontinued     500 mg 250 mL/hr over 60 Minutes Intravenous Every 24 hours 02/11/16 2135 02/14/16 1022     Code Status:     Code Status Orders        Start     Ordered    02/11/16 2136  Full code   Continuous     02/11/16 2135    Code Status History    Date Active Date Inactive Code Status Order ID Comments User Context   12/28/2015  3:18 AM 01/03/2016  5:39 PM DNR [id number]  Etta Quill, DO ED   12/28/2015  3:17 AM 12/28/2015  3:18 AM Full Code [id number]  Etta Quill, DO ED     Family Communication:   Disposition Plan: possible SNF- PT eval- tx out of SDU to med surg today  DVT prophylaxis: Lovenox Consultants:  Procedures:     Objective: Filed Weights   02/12/16 0000  Weight: 58.4 kg (128 lb 12 oz)    Intake/Output Summary (Last 24 hours) at 02/14/16 1656 Last data filed at 02/14/16 1351  Gross per 24 hour  Intake    850 ml  Output   1325 ml  Net   -475 ml     Vitals Filed Vitals:   02/14/16 0417 02/14/16 0849 02/14/16 0959 02/14/16 1458  BP: 123/62  128/52 110/62  Pulse: 96 91  77  Temp: 97.4 F (36.3 C)   97.4 F (36.3 C)  TempSrc: Axillary   Oral  Resp: 20 20  20   Height:      Weight:      SpO2: 98% 95%  100%    Exam:  General:  Pt is alert, not in acute distress  HEENT: No icterus, No  thrush, oral mucosa moist  Cardiovascular: regular rate and rhythm, S1/S2 No murmur  Respiratory: clear to auscultation bilaterally today   Abdomen: Soft, +Bowel sounds, non tender, non distended, no guarding  MSK: No cyanosis or clubbing- no pedal edema   Data Reviewed: Basic Metabolic Panel:  Recent Labs Lab 02/11/16 1656 02/12/16 0327 02/13/16 0330  NA 139 139 140  K 4.3 4.0 4.4  CL 103 104 107  CO2 24 25 25   GLUCOSE 144* 199* 166*  BUN 12 12 19   CREATININE 0.86 0.66 0.69  CALCIUM 9.1 8.8* 8.8*  MG 1.9  --   --   PHOS 2.8  --   --    Liver Function Tests:  Recent Labs Lab 02/12/16 0327  AST 14*  ALT 18  ALKPHOS 72  BILITOT 0.6  PROT 6.6  ALBUMIN 3.4*   No results for input(s): LIPASE, AMYLASE in the last 168 hours. No results for input(s): AMMONIA in the last 168 hours. CBC:  Recent Labs Lab  02/11/16 1656 02/12/16 0327 02/13/16 0330  WBC 20.6* 20.3* 19.1*  NEUTROABS 19.5* 19.7* 18.2*  HGB 12.6* 11.8* 10.4*  HCT 36.8* 34.3* 31.3*  MCV 91.8 91.2 91.5  PLT 314 304 281   Cardiac Enzymes: No results for input(s): CKTOTAL, CKMB, CKMBINDEX, TROPONINI in the last 168 hours. BNP (last 3 results)  Recent Labs  01/02/16 1318  BNP 60.0    ProBNP (last 3 results) No results for input(s): PROBNP in the last 8760 hours.  CBG: No results for input(s): GLUCAP in the last 168 hours.  Recent Results (from the past 240 hour(s))  MRSA PCR Screening     Status: None   Collection Time: 02/11/16  9:38 PM  Result Value Ref Range Status   MRSA by PCR NEGATIVE NEGATIVE Final    Comment:        The GeneXpert MRSA Assay (FDA approved for NASAL specimens only), is one component of a comprehensive MRSA colonization surveillance program. It is not intended to diagnose MRSA infection nor to guide or monitor treatment for MRSA infections.      Studies: No results found.  Scheduled Meds:  Scheduled Meds: . azithromycin  500 mg Oral Daily  . dextromethorphan-guaiFENesin  1 tablet Oral BID  . diltiazem  240 mg Oral Daily  . enoxaparin (LOVENOX) injection  40 mg Subcutaneous Q24H  . finasteride  5 mg Oral Daily  . ipratropium-albuterol  3 mL Nebulization Q6H  . pantoprazole  40 mg Oral Daily  . polyethylene glycol  17 g Oral QHS  . predniSONE  60 mg Oral Q breakfast  . rosuvastatin  10 mg Oral Daily  . sildenafil  20 mg Oral BID  . sodium chloride flush  3 mL Intravenous Q12H  . tamsulosin  0.4 mg Oral QPC breakfast  . temazepam  30 mg Oral QHS   Continuous Infusions:    Time spent on care of this patient: 35 min   Hanne Kegg A, MD 02/14/2016, 4:56 PM  LOS: 3 days   [facility] Office  [phone] Pager - Text Page per [email or web address] If 7PM-7AM, please contact night-coverage [email or web address]

## 2016-02-14 NOTE — Progress Notes (Signed)
44fr foley catheter inserted due to urinary retention. Initially upon insertion of catheter bright red blood began to drain around insertion site and into catheter but then clear, yellow urine followed and continues to drain into catheter at this time. 1000cc of urine emptied from catheter. Pt tolerated insertion well.

## 2016-02-14 NOTE — Progress Notes (Signed)
Pt voided 125cc at approximately 0400. Pt had c/o feeling pressure in lower abdomen but did not feel the urge to void. Pt bladder scanned and results show >986cc in bladder. Josephine Cables paged. Awaiting further orders.

## 2016-02-15 DIAGNOSIS — E785 Hyperlipidemia, unspecified: Secondary | ICD-10-CM | POA: Diagnosis not present

## 2016-02-15 DIAGNOSIS — M6281 Muscle weakness (generalized): Secondary | ICD-10-CM | POA: Diagnosis not present

## 2016-02-15 DIAGNOSIS — J9601 Acute respiratory failure with hypoxia: Secondary | ICD-10-CM | POA: Diagnosis not present

## 2016-02-15 DIAGNOSIS — J9611 Chronic respiratory failure with hypoxia: Secondary | ICD-10-CM | POA: Diagnosis not present

## 2016-02-15 DIAGNOSIS — R1311 Dysphagia, oral phase: Secondary | ICD-10-CM | POA: Diagnosis not present

## 2016-02-15 DIAGNOSIS — K219 Gastro-esophageal reflux disease without esophagitis: Secondary | ICD-10-CM | POA: Diagnosis not present

## 2016-02-15 DIAGNOSIS — I272 Other secondary pulmonary hypertension: Secondary | ICD-10-CM | POA: Diagnosis not present

## 2016-02-15 DIAGNOSIS — R0902 Hypoxemia: Secondary | ICD-10-CM | POA: Diagnosis not present

## 2016-02-15 DIAGNOSIS — N400 Enlarged prostate without lower urinary tract symptoms: Secondary | ICD-10-CM | POA: Diagnosis not present

## 2016-02-15 DIAGNOSIS — R0689 Other abnormalities of breathing: Secondary | ICD-10-CM | POA: Diagnosis not present

## 2016-02-15 DIAGNOSIS — J9621 Acute and chronic respiratory failure with hypoxia: Secondary | ICD-10-CM | POA: Diagnosis not present

## 2016-02-15 DIAGNOSIS — R0602 Shortness of breath: Secondary | ICD-10-CM | POA: Diagnosis not present

## 2016-02-15 DIAGNOSIS — Z9981 Dependence on supplemental oxygen: Secondary | ICD-10-CM | POA: Diagnosis not present

## 2016-02-15 DIAGNOSIS — G4733 Obstructive sleep apnea (adult) (pediatric): Secondary | ICD-10-CM | POA: Diagnosis not present

## 2016-02-15 DIAGNOSIS — J438 Other emphysema: Secondary | ICD-10-CM | POA: Diagnosis not present

## 2016-02-15 DIAGNOSIS — R2681 Unsteadiness on feet: Secondary | ICD-10-CM | POA: Diagnosis not present

## 2016-02-15 DIAGNOSIS — Z8679 Personal history of other diseases of the circulatory system: Secondary | ICD-10-CM | POA: Diagnosis not present

## 2016-02-15 DIAGNOSIS — J9600 Acute respiratory failure, unspecified whether with hypoxia or hypercapnia: Secondary | ICD-10-CM | POA: Diagnosis not present

## 2016-02-15 DIAGNOSIS — I10 Essential (primary) hypertension: Secondary | ICD-10-CM | POA: Diagnosis not present

## 2016-02-15 DIAGNOSIS — J441 Chronic obstructive pulmonary disease with (acute) exacerbation: Secondary | ICD-10-CM | POA: Diagnosis not present

## 2016-02-15 MED ORDER — LEVOFLOXACIN 750 MG PO TABS: 750.0000 mg | ORAL_TABLET | Freq: Every day | ORAL | Status: DC

## 2016-02-15 MED ORDER — PREDNISONE 10 MG PO TABS: ORAL_TABLET | ORAL | Status: DC

## 2016-02-15 NOTE — Clinical Social Work Note (Signed)
[facility] has a bed for pt today to begin rehab.  CSW coordinated pt's daughter and husband picking up pt and transporting to SNF and will sign paperwork at facility.  CSW will send d/c summary through the HUB once completed.   Dede Query, LCSW [facility] Worker - Weekend Coverage cell #: [phone]

## 2016-02-15 NOTE — Clinical Social Work Placement (Signed)
   CLINICAL SOCIAL WORK PLACEMENT  NOTE  Date:  02/15/2016  Patient Details  Name: Andrew [name] MRN: [id number] Date of Birth: 1941-07-17  Clinical Social Work is seeking post-discharge placement for this patient at the [place] level of care (*CSW will initial, date and re-position this form in  chart as items are completed):  Yes   Patient/family provided with [place] Work Department's list of facilities offering this level of care within the geographic area requested by the patient (or if unable, by the patient's family).  Yes   Patient/family informed of their freedom to choose among providers that offer the needed level of care, that participate in Medicare, Medicaid or managed care program needed by the patient, have an available bed and are willing to accept the patient.  Yes   Patient/family informed of [facility]'s ownership interest in [facility] and [facility], as well as of the fact that they are under no obligation to receive care at these facilities.  PASRR submitted to EDS on 02/15/16     PASRR number received on 02/15/16     Existing PASRR number confirmed on       FL2 transmitted to all facilities in geographic area requested by pt/family on 02/15/16     FL2 transmitted to all facilities within larger geographic area on       Patient informed that his/her managed care company has contracts with or will negotiate with certain facilities, including the following:        Yes   Patient/family informed of bed offers received.  Patient chooses bed at  ([facility])     Physician recommends and patient chooses bed at      Patient to be transferred to [facility] on 02/15/16.  Patient to be transferred to facility by ambulance     Patient family notified on 02/15/16 of transfer.  Name of family member notified:  daughter/lorie     PHYSICIAN       Additional Comment:     _______________________________________________ Carlean Jews, LCSW 02/15/2016, 3:37 PM

## 2016-02-15 NOTE — NC FL2 (Signed)
[place] LEVEL OF CARE SCREENING TOOL     IDENTIFICATION  Patient Name: Andrew [name] Birthdate: 1940/11/27 Sex: male Admission Date (Current Location): 02/11/2016  [facility] and [place] Number:  Herbalist and Address:  [facility],  [place] [address], [place]      Provider Number: [id number]  Attending Physician Name and Address:  Verlee Monte, MD  Relative Name and Phone Number:       Current Level of Care: Hospital Recommended Level of Care: [place] Prior Approval Number:    Date Approved/Denied:   PASRR Number: [id number]  Discharge Plan: SNF    Current Diagnoses: Patient Active Problem List   Diagnosis Date Noted  . COPD exacerbation ([place]) 02/13/2016  . Acute respiratory failure with hypoxia ([place]) 02/12/2016  . Leukocytosis 02/11/2016  . Hypertension 02/11/2016  . Hyperlipidemia 02/11/2016  . CAP (community acquired pneumonia)   . Acute urinary retention 12/29/2015  . Sepsis ([place]) 12/28/2015  . Acute on chronic respiratory failure with hypoxia ([place]) 12/28/2015  . 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 Chronic hypoxemic respiratory failure ([place]) 10/02/2015  . History of pulmonary hypertension 10/02/2015    Orientation RESPIRATION BLADDER Height & Weight     Self, Time, Situation, Place  O2 (4 liters) Continent, Indwelling catheter Weight: 128 lb 12 oz (58.4 kg) Height:  5\' 7"  (170.2 cm)  BEHAVIORAL SYMPTOMS/MOOD NEUROLOGICAL BOWEL NUTRITION STATUS      Continent    AMBULATORY STATUS COMMUNICATION OF NEEDS Skin   Limited Assist Verbally Normal                       Personal Care Assistance Level of Assistance  Bathing, Dressing Bathing Assistance: Limited assistance   Dressing Assistance: Limited assistance     Functional Limitations Info             SPECIAL CARE FACTORS FREQUENCY                       Contractures      Additional Factors Info  Code Status,  Allergies Code Status Info: full code Allergies Info: no known allergies           Current Medications (02/15/2016):  This is the current hospital active medication list Current Facility-Administered Medications  Medication Dose Route Frequency Provider Last Rate Last Dose  . acetaminophen (TYLENOL) tablet 650 mg  650 mg Oral Q6H PRN Reubin Milan, MD       Or  . acetaminophen (TYLENOL) suppository 650 mg  650 mg Rectal Q6H PRN Reubin Milan, MD      . albuterol (PROVENTIL) (2.5 MG/3ML) 0.083% nebulizer solution 2.5 mg  2.5 mg Nebulization Q4H PRN Reubin Milan, MD   2.5 mg at 02/15/16 0253  . azithromycin (ZITHROMAX) tablet 500 mg  500 mg Oral Daily Debbe Odea, MD   500 mg at 02/15/16 0950  . dextromethorphan-guaiFENesin (MUCINEX DM) 30-600 MG per 12 hr tablet 1 tablet  1 tablet Oral BID Reubin Milan, MD   1 tablet at 02/15/16 0950  . diltiazem (TIAZAC) 24 hr capsule 240 mg  240 mg Oral Daily Reubin Milan, MD   240 mg at 02/15/16 0950  . enoxaparin (LOVENOX) injection 40 mg  40 mg Subcutaneous Q24H Reubin Milan, MD   40 mg at 02/14/16 2150  . finasteride (PROSCAR) tablet 5 mg  5 mg Oral Daily Reubin Milan, MD  5 mg at 02/15/16 0950  . ipratropium-albuterol (DUONEB) 0.5-2.5 (3) MG/3ML nebulizer solution 3 mL  3 mL Nebulization TID Verlee Monte, MD   3 mL at 02/15/16 0752  . LORazepam (ATIVAN) injection 1 mg  1 mg Intravenous Q6H PRN Reubin Milan, MD      . menthol-cetylpyridinium (CEPACOL) lozenge 3 mg  1 lozenge Oral PRN Debbe Odea, MD      . ondansetron [facility]) tablet 4 mg  4 mg Oral Q6H PRN Reubin Milan, MD       Or  . ondansetron [facility]) injection 4 mg  4 mg Intravenous Q6H PRN Reubin Milan, MD      . pantoprazole (PROTONIX) EC tablet 40 mg  40 mg Oral Daily Reubin Milan, MD   40 mg at 02/15/16 0950  . polyethylene glycol (MIRALAX / GLYCOLAX) packet 17 g  17 g Oral QHS Reubin Milan, MD   17 g at 02/14/16 2150  .  predniSONE (DELTASONE) tablet 60 mg  60 mg Oral Q breakfast Debbe Odea, MD   60 mg at 02/15/16 0826  . rosuvastatin (CRESTOR) tablet 10 mg  10 mg Oral Daily Reubin Milan, MD   10 mg at 02/15/16 0950  . sildenafil (REVATIO) tablet 20 mg  20 mg Oral BID Reubin Milan, MD   20 mg at 02/15/16 0950  . sodium chloride flush (NS) 0.9 % injection 3 mL  3 mL Intravenous Q12H Reubin Milan, MD   3 mL at 02/13/16 2150  . tamsulosin (FLOMAX) capsule 0.4 mg  0.4 mg Oral QPC breakfast Reubin Milan, MD   0.4 mg at 02/15/16 0825  . temazepam (RESTORIL) capsule 30 mg  30 mg Oral QHS Reubin Milan, MD   30 mg at 02/14/16 2150     Discharge Medications: Please see discharge summary for a list of discharge medications.  Relevant Imaging Results:  Relevant Lab Results:   Additional Information [phone]  Carlean Jews, [place]

## 2016-02-15 NOTE — Clinical Social Work Note (Signed)
CSW spoke with pt's daughter and provided bed options.  Pt's first choice is [place], Second is [facility].  CSW  left messages with [facility] and [facility] to assess bed availability.   Dede Query, LCSW [place] Worker - Weekend Coverage cell #: [phone]

## 2016-02-15 NOTE — Progress Notes (Signed)
Discharge instructions accompanied pt, left the unit in stable condition via ambulance  to SNF.

## 2016-02-15 NOTE — Clinical Social Work Note (Signed)
Clinical Social Work Assessment  Patient Details  Name: Andrew [name] MRN: [id number] Date of Birth: 11-Oct-1941  Date of referral:  02/15/16               Reason for consult:  Facility Placement                Permission sought to share information with:  Facility Sport and exercise psychologist, Family Supports Permission granted to share information::  Yes, Verbal Permission Granted  Name::        Agency::     Relationship::     Contact Information:     Housing/Transportation Living arrangements for the past 2 months:  Single Family Home Source of Information:  Patient Patient Interpreter Needed:    Criminal Activity/Legal Involvement Pertinent to Current Situation/Hospitalization:    Significant Relationships:  Adult Children Lives with:    Do you feel safe going back to the place where you live?    Need for family participation in patient care:  Yes (Comment)  Care giving concerns:  CSW waiting to hear back from pt's daughter   Social Worker assessment / plan:  CSW met with pt at bedside to discuss discharge needs.  CSW met with pt at bedside to discuss discharge needs.  CSW prompted pt to discuss history and needs.  CSW provided information regarding rehab and protocol for finding SNF bed.  CSW encouraged pt to explore thoughts and feelings related to rehab at discharge.  CSW will fax pt information to SNF's in [place] and follow up with pt's daughter with help identifying rehab bed for pt.  Pt stated "I feel good today" and was ready to leave the hospital.   Employment status:  Retired Insurance underwriter information:  Medicare PT Recommendations:  [place] / Referral to community resources:     Patient/Family's Response to care:  Pt discussed moving from [place] 5 months ago to live with his daughter/Andrew [name].  Pt discussed past physical therapy but no SNF placement history.  Pt wants CSW to send information out and follow up with his daughter.  Pt discussed having a  son who resides in [name]/Andrew [name] [phone].    Patient/Family's Understanding of and Emotional Response to Diagnosis, Current Treatment, and Prognosis:  Pt understands that he requires rehab at discharge to gain his strength back before returning home with his daughter.   Emotional Assessment Appearance:  Appears stated age Attitude/Demeanor/Rapport:  Lethargic Affect (typically observed):  Accepting Orientation:  Oriented to Self, Oriented to Place, Oriented to  Time, Oriented to Situation Alcohol / Substance use:    Psych involvement (Current and /or in the community):  No (Comment)  Discharge Needs  Concerns to be addressed:    Readmission within the last 30 days:  No Current discharge risk:    Barriers to Discharge:  No Barriers Identified   Andrew [name] G, LCSW 02/15/2016, 1:10 PM

## 2016-02-15 NOTE — Discharge Summary (Signed)
Physician Discharge Summary  Andrew [name] [id number] DOB: 1941/01/07 DOA: 02/11/2016  PCP: Lujean Amel, MD  Admit date: 02/11/2016 Discharge date: 02/15/2016  Time spent: 40 minutes  Recommendations for Outpatient Follow-up:  1. Follow-up with primary care physician within one week. 2. Levofloxacin for 4 more days and prednisone taper. 3. Keep Foley catheter in on discharge, voiding trials in 3-4 days, if fails reinsert Foley and referred to urology as outpatient.   Discharge Diagnoses:  Principal Problem:   Acute respiratory failure with hypoxia ([place]) Active Problems:   History of pulmonary hypertension   Leukocytosis   Hypertension   Hyperlipidemia   COPD exacerbation ([place])   Discharge Condition: Stable  Diet recommendation: Heart healthy  Filed Weights   02/12/16 0000  Weight: 58.4 kg (128 lb 12 oz)    History of present illness:  Andrew [name] is a 75 y.o. male with a past medical history hypertension, hyperlipidemia, pulmonary hypertension, COPD/emphysema who comes to the emergency department via EMS due to shortness of breath since yesterday. Per patient, since yesterday he has been having worsening dyspnea with wheezing and occasionally productive cough, which does not respond to his rescue inhaler. Today he felt so this neck that he called EMS. When EMS arrived, the patient was in respiratory distress he was given supplemental oxygen, 5 mg albuterol nebulizer treatment and 125 mg of IV Solu-Medrol. He denies chest pain, palpitations, dizziness, diaphoresis, pitting edema lower extremities.  When seen in the emergency department, the patient was in respiratory distress and was upgraded from telemetry to the stepdown unit. Workup was significant for leukocytosis and a chest radiograph showing chronic emphysematous changes, but no acute infiltrates.  Hospital Course:   Acute COPD exacerbation - CXR x 2 negative for pneumonia, Was on Rocephin and Zithromax.  -  Respiratory virus panel and influenza PCR negative for coronary, not repeated this time. - Started on Solu-Medrol and Zithromax continued. - Treated with supportive management with bronchodilators, mucolytics, antitussives and oxygen. -  At discharge levofloxacin for 4 more days and prednisone taper.  Chronic hypoxic respiratory failure -Chronically on oxygen, wean off to keep on his home dose of 2-3 L of oxygen.   History of pulmonary hypertension - on Revatio, continue oxygen and home medications.   Hypertension - Diltiazem   BPH - Flomax, Proscar, developed retention while in the hospital, Foley catheter placed. - Foley catheter removed and he still has urine retention, Foley reinserted. - Keep Foley on discharge, voiding trial in 3-4 days if continues to have urine retention reinserted Foley and referred to urology as outpatient.  OSA - BiPAP at bedtime  Debility -Seen by PT/OT and recommended SNF, CSW to evaluate for SNF placement.    Procedures:  None  Consultations:  None  Discharge Exam: Filed Vitals:   02/15/16 1300 02/15/16 1400  BP:  109/53  Pulse: 86 91  Temp:  97.5 F (36.4 C)  Resp: 18 18   General: Alert and awake, oriented x3, not in any acute distress. HEENT: anicteric sclera, pupils reactive to light and accommodation, EOMI CVS: S1-S2 clear, no murmur rubs or gallops Chest: clear to auscultation bilaterally, no wheezing, rales or rhonchi Abdomen: soft nontender, nondistended, normal bowel sounds, no organomegaly Extremities: no cyanosis, clubbing or edema noted bilaterally Neuro: Cranial nerves II-XII intact, no focal neurological deficits  Discharge Instructions   Discharge Instructions    Diet - low sodium heart healthy    Complete by:  As directed      Increase activity slowly  Complete by:  As directed           Current Discharge Medication List    START taking these medications   Details  levofloxacin (LEVAQUIN) 750 MG  tablet Take 1 tablet (750 mg total) by mouth daily. Qty: 4 tablet, Refills: 0      CONTINUE these medications which have CHANGED   Details  predniSONE (DELTASONE) 10 MG tablet Take 4 tabs PO for 2 days, then take 3 tabs PO for 2 days, then take 2 tabs PO for 2 days, then take 1 tabs PO for 2 days, then stop. Qty: 21 tablet, Refills: 0      CONTINUE these medications which have NOT CHANGED   Details  ADVAIR DISKUS 250-50 MCG/DOSE AEPB Inhale 1 puff into the lungs 2 (two) times daily. Qty: 60 each, Refills: 2   Associated Diagnoses: Pulmonary hypertension ([place]); Other emphysema ([place]); Chronic hypoxemic respiratory failure ([place]); History of pulmonary hypertension; Dyspnea    albuterol (PROVENTIL HFA;VENTOLIN HFA) 108 (90 Base) MCG/ACT inhaler Inhale 2 puffs into the lungs every 6 (six) hours as needed for wheezing or shortness of breath. Qty: 1 Inhaler, Refills: 2    Ascorbic Acid (VITAMIN C PO) Take 1 tablet by mouth daily.    CALCIUM PO Take 1 tablet by mouth daily.    dextromethorphan-guaiFENesin (MUCINEX DM) 30-600 MG 12hr tablet Take 1 tablet by mouth 2 (two) times daily. Qty: 60 tablet, Refills: 0    diltiazem (TIAZAC) 240 MG 24 hr capsule Take 240 mg by mouth daily.    esomeprazole (NEXIUM) 20 MG capsule Take 20 mg by mouth 2 (two) times daily before a meal.    Associated Diagnoses: Pulmonary hypertension ([place]); Other emphysema ([place]); Chronic hypoxemic respiratory failure ([place]); History of pulmonary hypertension; Dyspnea    finasteride (PROSCAR) 5 MG tablet Take 5 mg by mouth daily.    Associated Diagnoses: Pulmonary hypertension ([place]); Other emphysema ([place]); Chronic hypoxemic respiratory failure ([place]); History of pulmonary hypertension; Dyspnea    Multiple Vitamins-Minerals (MULTIVITAMIN WITH MINERALS) tablet Take 1 tablet by mouth daily.    nystatin (MYCOSTATIN) 100000 UNIT/ML suspension Take 6 mLs by mouth 4 (four) times daily. Reported on 01/10/2016    polyethylene glycol  (MIRALAX / GLYCOLAX) packet Take 17 g by mouth at bedtime.    rosuvastatin (CRESTOR) 10 MG tablet Take 10 mg by mouth daily.   Associated Diagnoses: Pulmonary hypertension ([place]); Other emphysema ([place]); Chronic hypoxemic respiratory failure ([place]); History of pulmonary hypertension; Dyspnea    sildenafil (REVATIO) 20 MG tablet Take 20 mg by mouth 2 (two) times daily.    Associated Diagnoses: Pulmonary hypertension ([place]); Other emphysema ([place]); Chronic hypoxemic respiratory failure ([place]); History of pulmonary hypertension; Dyspnea    SPIRIVA HANDIHALER 18 MCG inhalation capsule Place 1 capsule (18 mcg total) into inhaler and inhale daily. Qty: 30 capsule, Refills: 2   Associated Diagnoses: Pulmonary hypertension ([place]); Other emphysema ([place]); Chronic hypoxemic respiratory failure ([place]); History of pulmonary hypertension; Dyspnea    tamsulosin (FLOMAX) 0.4 MG CAPS capsule Take 1 capsule (0.4 mg total) by mouth daily after breakfast. Qty: 30 capsule, Refills: 0    temazepam (RESTORIL) 30 MG capsule Take 30 mg by mouth at bedtime.   Associated Diagnoses: Pulmonary hypertension ([place]); Other emphysema ([place]); Chronic hypoxemic respiratory failure ([place]); History of pulmonary hypertension; Dyspnea    vitamin E 1000 UNIT capsule Take 1,000 Units by mouth daily.      STOP taking these medications     mupirocin ointment (BACTROBAN) 2 %  No Known Allergies    The results of significant diagnostics from this hospitalization (including imaging, microbiology, ancillary and laboratory) are listed below for reference.    Significant Diagnostic Studies: Dg Chest 2 View  02/11/2016  CLINICAL DATA:  Shortness of breath, COPD, emphysema EXAM: CHEST  2 VIEW COMPARISON:  12/30/2015 FINDINGS: Cardiomediastinal silhouette is stable. Hyperinflation and emphysematous bullous changes again noted. Stable streaky scarring in right midlung and bilateral lower lobes. No definite superimposed infiltrate or  pulmonary edema. IMPRESSION: Hyperinflation and emphysematous bullous changes again noted. Stable streaky scarring in right midlung and bilateral lower lobes. No definite superimposed infiltrate or pulmonary edema. Electronically Signed   By: Lahoma Crocker M.D.   On: 02/11/2016 17:09   Dg Chest Port 1 View  02/12/2016  CLINICAL DATA:  Hypoxia, emphysema EXAM: PORTABLE CHEST 1 VIEW COMPARISON:  02/11/2016 FINDINGS: Hyperinflation and severe bullous emphysematous changes within the lungs. Stable scarring in the right mid lung and lung bases. No acute airspace opacities. Heart is normal size. No effusions or acute bony abnormality. 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 No active disease. Electronically Signed   By: Rolm Baptise M.D.   On: 02/12/2016 09:06    Microbiology: Recent Results (from the past 240 hour(s))  MRSA PCR Screening     Status: None   Collection Time: 02/11/16  9:38 PM  Result Value Ref Range Status   MRSA by PCR NEGATIVE NEGATIVE Final    Comment:        The GeneXpert MRSA Assay (FDA approved for NASAL specimens only), is one component of a comprehensive MRSA colonization surveillance program. It is not intended to diagnose MRSA infection nor to guide or monitor treatment for MRSA infections.      Labs: Basic Metabolic Panel:  Recent Labs Lab 02/11/16 1656 02/12/16 0327 02/13/16 0330  NA 139 139 140  K 4.3 4.0 4.4  CL 103 104 107  CO2 24 25 25   GLUCOSE 144* 199* 166*  BUN 12 12 19   CREATININE 0.86 0.66 0.69  CALCIUM 9.1 8.8* 8.8*  MG 1.9  --   --   PHOS 2.8  --   --    Liver Function Tests:  Recent Labs Lab 02/12/16 0327  AST 14*  ALT 18  ALKPHOS 72  BILITOT 0.6  PROT 6.6  ALBUMIN 3.4*   No results for input(s): LIPASE, AMYLASE in the last 168 hours. No results for input(s): AMMONIA in the last 168 hours. CBC:  Recent Labs Lab 02/11/16 1656 02/12/16 0327 02/13/16 0330  WBC 20.6* 20.3* 19.1*  NEUTROABS 19.5*  19.7* 18.2*  HGB 12.6* 11.8* 10.4*  HCT 36.8* 34.3* 31.3*  MCV 91.8 91.2 91.5  PLT 314 304 281   Cardiac Enzymes: No results for input(s): CKTOTAL, CKMB, CKMBINDEX, TROPONINI in the last 168 hours. BNP: BNP (last 3 results)  Recent Labs  01/02/16 1318  BNP 60.0    ProBNP (last 3 results) No results for input(s): PROBNP in the last 8760 hours.  CBG: No results for input(s): GLUCAP in the last 168 hours.     Signed:  Birdie Hopes MD.  [facility] 02/15/2016, 2:42 PM

## 2016-02-15 NOTE — Clinical Social Work Note (Signed)
CSW called [facility] for pt transport to [facility].  Pt's daughter brought pt some clothes and will be heading over to sign paperwork.    Dede Query, LCSW [place] Worker - Weekend Coverage cell #: [phone]

## 2016-02-15 NOTE — Clinical Social Work Note (Signed)
CSW called and left message for pt's daughter to provide SNF bed offers.   Dede Query, LCSW [place] Worker - Weekend Coverage cell #: [phone]

## 2016-02-16 ENCOUNTER — Encounter: Payer: Self-pay | Admitting: Internal Medicine

## 2016-02-16 ENCOUNTER — Non-Acute Institutional Stay (SKILLED_NURSING_FACILITY): Payer: Medicare Other | Admitting: Internal Medicine

## 2016-02-16 DIAGNOSIS — G4733 Obstructive sleep apnea (adult) (pediatric): Secondary | ICD-10-CM

## 2016-02-16 DIAGNOSIS — J441 Chronic obstructive pulmonary disease with (acute) exacerbation: Secondary | ICD-10-CM

## 2016-02-16 DIAGNOSIS — J9611 Chronic respiratory failure with hypoxia: Secondary | ICD-10-CM

## 2016-02-16 DIAGNOSIS — I10 Essential (primary) hypertension: Secondary | ICD-10-CM | POA: Diagnosis not present

## 2016-02-16 DIAGNOSIS — N401 Enlarged prostate with lower urinary tract symptoms: Secondary | ICD-10-CM

## 2016-02-16 DIAGNOSIS — N400 Enlarged prostate without lower urinary tract symptoms: Secondary | ICD-10-CM

## 2016-02-16 DIAGNOSIS — E785 Hyperlipidemia, unspecified: Secondary | ICD-10-CM

## 2016-02-16 DIAGNOSIS — R338 Other retention of urine: Secondary | ICD-10-CM

## 2016-02-16 DIAGNOSIS — I272 Other secondary pulmonary hypertension: Secondary | ICD-10-CM

## 2016-02-16 DIAGNOSIS — K219 Gastro-esophageal reflux disease without esophagitis: Secondary | ICD-10-CM

## 2016-02-16 NOTE — Progress Notes (Signed)
Patient ID: Andrew [name], male   DOB: Apr 07, 1941, 75 y.o.   MRN: [id number] MRN: [id number] Name: Andrew [name]  Sex: male Age: 75 y.o. DOB: 1941/04/27  [place] [facility] Facility/Room: 104 P Level Of Care: SNF Provider: Hennie Duos Emergency Contacts: Extended Emergency Contact Information Primary Emergency Contact: [place] Phone: [phone] Relation: Daughter  Code Status:   Allergies: Review of patient's allergies indicates no known allergies.  Chief Complaint  Patient presents with  . New Admit To SNF    Admission to facility    HPI: Patient is 75 y.o. male whhypertension, hyperlipidemia, pulmonary hypertension, COPD/emphysema who comes to the emergency department via EMS due to shortness of breath  FOR 1 DAY. PT admitted to [facility] from 3/26-30 were he was treated for acute COPD exacerbation. Hospital course was complicated by acute urinary retention requiring a foley. Pt is admitted to SNF for generalized weakness for OT/PT. While at SNF pt will be followed for HTN, tx withdiltiazem, pulmonary HTN, tx with revatio and OSA, tx with BiPAP.  Past Medical History  Diagnosis Date  . Emphysema lung ([place])   . Hypertension   . Pulmonary hypertension [facility])     Past Surgical History  Procedure Laterality Date  . Tonsillectomy    . Lung surgery        Medication List       This list is accurate as of: 02/16/16 11:59 PM.  Always use your most recent med list.               ADVAIR DISKUS 250-50 MCG/DOSE Aepb  Generic drug:  Fluticasone-Salmeterol  Inhale 1 puff into the lungs 2 (two) times daily.     albuterol 108 (90 Base) MCG/ACT inhaler  Commonly known as:  PROVENTIL HFA;VENTOLIN HFA  Inhale 2 puffs into the lungs every 6 (six) hours as needed for wheezing or shortness of breath.     CALCIUM PO  Take 500 mg by mouth daily.     dextromethorphan-guaiFENesin 30-600 MG 12hr tablet  Commonly known as:  MUCINEX DM  Take 1  tablet by mouth 2 (two) times daily.     diltiazem 240 MG 24 hr capsule  Commonly known as:  TIAZAC  Take 240 mg by mouth daily.     esomeprazole 20 MG capsule  Commonly known as:  NEXIUM  Take 20 mg by mouth 2 (two) times daily before a meal.     finasteride 5 MG tablet  Commonly known as:  PROSCAR  Take 5 mg by mouth daily.     levofloxacin 750 MG tablet  Commonly known as:  LEVAQUIN  Take 1 tablet (750 mg total) by mouth daily.     multivitamin with minerals tablet  Take 1 tablet by mouth daily.     nystatin 100000 UNIT/ML suspension  Commonly known as:  MYCOSTATIN  Take 6 mLs by mouth 4 (four) times daily. Reported on 01/10/2016     polyethylene glycol packet  Commonly known as:  MIRALAX / GLYCOLAX  Take 17 g by mouth at bedtime.     predniSONE 10 MG tablet  Commonly known as:  DELTASONE  Take 4 tabs PO for 2 days, then take 3 tabs PO for 2 days, then take 2 tabs PO for 2 days, then take 1 tabs PO for 2 days, then stop.     RESTORIL 30 MG capsule  Generic drug:  temazepam  Take 30 mg by mouth at bedtime.  rosuvastatin 10 MG tablet  Commonly known as:  CRESTOR  Take 10 mg by mouth daily.     sildenafil 20 MG tablet  Commonly known as:  REVATIO  Take 20 mg by mouth 2 (two) times daily.     SPIRIVA HANDIHALER 18 MCG inhalation capsule  Generic drug:  tiotropium  Place 1 capsule (18 mcg total) into inhaler and inhale daily.     tamsulosin 0.4 MG Caps capsule  Commonly known as:  FLOMAX  Take 1 capsule (0.4 mg total) by mouth daily after breakfast.     VITAMIN C PO  Take 500 mg by mouth daily.     vitamin E 1000 UNIT capsule  Take 1,000 Units by mouth daily.        No orders of the defined types were placed in this encounter.    Immunization History  Administered Date(s) Administered  . Influenza-Unspecified 09/19/2015  . PPD Test 02/15/2016  . Pneumococcal Conjugate-13 09/19/2015    Social History  Substance Use Topics  . Smoking status:  Former Smoker -- 0.00 packs/day for 0 years    Types: Cigarettes    Quit date: 10/01/1984  . Smokeless tobacco: Not on file  . Alcohol Use: No    Family history is + bone CA  Review of Systems  DATA OBTAINED: from patient GENERAL:  no fevers,+ fatigue, gets SOB chewing food-would like ensure SKIN: No itching, rash or wounds EYES: No eye pain, redness, discharge EARS: No earache, tinnitus, change in hearing NOSE: No congestion, drainage or bleeding  MOUTH/THROAT: No mouth or tooth pain, No sore throat RESPIRATORY: No cough, wheezing, SOB CARDIAC: No chest pain, palpitations, lower extremity edema  GI: No abdominal pain, No N/V/D or constipation, No heartburn or reflux  GU: No dysuria, frequency or urgency, or incontinence  MUSCULOSKELETAL: No unrelieved bone/joint pain NEUROLOGIC: No headache, dizziness or focal weakness PSYCHIATRIC: No c/o anxiety or sadness   Filed Vitals:   02/16/16 1057  BP: 109/53  Pulse: 91  Temp: 97.5 F (36.4 C)  Resp: 18    SpO2 Readings from Last 1 Encounters:  02/15/16 97%        Physical Exam  GENERAL APPEARANCE: Alert, conversant,  No acute distress.  SKIN: No diaphoresis rash HEAD: Normocephalic, atraumatic  EYES: Conjunctiva/lids clear. Pupils round, reactive. EOMs intact.  EARS: External exam WNL, canals clear. Hearing grossly normal.  NOSE: No deformity or discharge.  MOUTH/THROAT: Lips w/o lesions  RESPIRATORY: Breathing is even, unlabored. Lung sounds are diffusely decreased; pt with SOB while wearing [place] with speaking    CARDIOVASCULAR: Heart RRR no murmurs, rubs or gallops. No peripheral edema.   GASTROINTESTINAL: Abdomen is soft, non-tender, not distended w/ normal bowel sounds. GENITOURINARY: Bladder non tender, not distended  MUSCULOSKELETAL: No abnormal joints or musculature NEUROLOGIC:  Cranial nerves 2-12 grossly intact. Moves all extremities  PSYCHIATRIC: Mood and affect appropriate to situation, no behavioral  issues  Patient Active Problem List   Diagnosis Date Noted  . Pulmonary hypertension ([place]) 02/20/2016  . OSA (obstructive sleep apnea) 02/20/2016  . GERD (gastroesophageal reflux disease) 02/20/2016  . COPD exacerbation ([place]) 02/13/2016  . Acute respiratory failure with hypoxia ([place]) 02/12/2016  . Leukocytosis 02/11/2016  . Hypertension 02/11/2016  . Hyperlipidemia 02/11/2016  . CAP (community acquired pneumonia)   . BPH (benign prostatic hypertrophy) with urinary retention 12/29/2015  . Sepsis ([place]) 12/28/2015  . Acute on chronic respiratory failure with hypoxia ([place]) 12/28/2015  . COPD with emphysema ([place]) 10/02/2015  .  Chronic hypoxemic respiratory failure ([place]) 10/02/2015  . History of pulmonary hypertension 10/02/2015    CBC    Component Value Date/Time   WBC 19.1* 02/13/2016 0330   RBC 3.42* 02/13/2016 0330   HGB 10.4* 02/13/2016 0330   HCT 31.3* 02/13/2016 0330   PLT 281 02/13/2016 0330   MCV 91.5 02/13/2016 0330   LYMPHSABS 0.4* 02/13/2016 0330   MONOABS 0.5 02/13/2016 0330   EOSABS 0.0 02/13/2016 0330   BASOSABS 0.0 02/13/2016 0330    CMP     Component Value Date/Time   NA 140 02/13/2016 0330   K 4.4 02/13/2016 0330   CL 107 02/13/2016 0330   CO2 25 02/13/2016 0330   GLUCOSE 166* 02/13/2016 0330   BUN 19 02/13/2016 0330   CREATININE 0.69 02/13/2016 0330   CALCIUM 8.8* 02/13/2016 0330   PROT 6.6 02/12/2016 0327   ALBUMIN 3.4* 02/12/2016 0327   AST 14* 02/12/2016 0327   ALT 18 02/12/2016 0327   ALKPHOS 72 02/12/2016 0327   BILITOT 0.6 02/12/2016 0327   GFRNONAA >60 02/13/2016 0330   GFRAA >60 02/13/2016 0330    Lab Results  Component Value Date   HGBA1C 6.2* 01/02/2016     Dg Chest 2 View  02/11/2016  CLINICAL DATA:  Shortness of breath, COPD, emphysema EXAM: CHEST  2 VIEW COMPARISON:  12/30/2015 FINDINGS: Cardiomediastinal silhouette is stable. Hyperinflation and emphysematous bullous changes again noted. Stable streaky scarring in right midlung  and bilateral lower lobes. No definite superimposed infiltrate or pulmonary edema. IMPRESSION: Hyperinflation and emphysematous bullous changes again noted. Stable streaky scarring in right midlung and bilateral lower lobes. No definite superimposed infiltrate or pulmonary edema. Electronically Signed   By: Lahoma Crocker M.D.   On: 02/11/2016 17:09   Dg Chest Port 1 View  02/12/2016  CLINICAL DATA:  Hypoxia, emphysema EXAM: PORTABLE CHEST 1 VIEW COMPARISON:  02/11/2016 FINDINGS: Hyperinflation and severe bullous emphysematous changes within the lungs. Stable scarring in the right mid lung and lung bases. No acute airspace opacities. Heart is normal size. No effusions or acute bony abnormality. 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 No active disease. Electronically Signed   By: Rolm Baptise M.D.   On: 02/12/2016 09:06    Not all labs, radiology exams or other studies done during hospitalization come through on my EPIC note; however they are reviewed by me.    Assessment and Plan  COPD exacerbation ([place]) CXR x 2 negative for pneumonia, Was on Rocephin and Zithromax.  - Respiratory virus panel and influenza PCR negative for coronary, not repeated this time. - Started on Solu-Medrol and Zithromax continued. - Treated with supportive management with bronchodilators, mucolytics, antitussives and oxygen. - At discharge levofloxacin for 4 more days and prednisone taper. SNF- cont with spiriva,muccinex,advair  Chronic hypoxemic respiratory failure (HCC) SNF - Chronically on oxygen, wean off to keep on his home dose of 2-3 L of oxygen.   Pulmonary hypertension (HCC) SNF - on Revatio, continue oxygen   Hypertension SNF - controlled;cont diltiazem 240 mg daily  BPH (benign prostatic hypertrophy) with urinary retention Flomax, Proscar, developed retention while in the hospital, Foley catheter placed. - Foley catheter removed and he still has urine retention, Foley  reinserted. SNF -  Foley on discharge, voiding trial in 3-4 days if continues to have urine retention reinserted Foley and referred to urology as outpatient  OSA (obstructive sleep apnea) SNF - cont nightly BiPAP  Hyperlipidemia SNF - not stated as uncontrolled ;cont crestor 10  mg daily  GERD (gastroesophageal reflux disease) SNF - not stated as uncontrolled; cont nexium 20 mg BID   Time spent > 45 min;> 50% of time with patient was spent reviewing records, labs, tests and studies, counseling and developing plan of care  Inocencio Homes, MD

## 2016-02-20 DIAGNOSIS — I272 Pulmonary hypertension, unspecified: Secondary | ICD-10-CM | POA: Insufficient documentation

## 2016-02-20 DIAGNOSIS — K219 Gastro-esophageal reflux disease without esophagitis: Secondary | ICD-10-CM | POA: Insufficient documentation

## 2016-02-20 NOTE — Assessment & Plan Note (Signed)
SNF - not stated as uncontrolled ;cont crestor 10 mg daily

## 2016-02-20 NOTE — Assessment & Plan Note (Signed)
SNF - cont nightly BiPAP

## 2016-02-20 NOTE — Assessment & Plan Note (Signed)
SNF - on Revatio, continue oxygen

## 2016-02-20 NOTE — Assessment & Plan Note (Signed)
CXR x 2 negative for pneumonia, Was on Rocephin and Zithromax.  - Respiratory virus panel and influenza PCR negative for coronary, not repeated this time. - Started on Solu-Medrol and Zithromax continued. - Treated with supportive management with bronchodilators, mucolytics, antitussives and oxygen. - At discharge levofloxacin for 4 more days and prednisone taper. SNF- cont with spiriva,muccinex,advair

## 2016-02-20 NOTE — Assessment & Plan Note (Signed)
SNF - controlled;cont diltiazem 240 mg daily

## 2016-02-20 NOTE — Assessment & Plan Note (Signed)
Flomax, Proscar, developed retention while in the hospital, Foley catheter placed. - Foley catheter removed and he still has urine retention, Foley reinserted. SNF -  Foley on discharge, voiding trial in 3-4 days if continues to have urine retention reinserted Foley and referred to urology as outpatient

## 2016-02-20 NOTE — Assessment & Plan Note (Signed)
SNF - not stated as uncontrolled; cont nexium 20 mg BID

## 2016-02-20 NOTE — Assessment & Plan Note (Signed)
SNF - Chronically on oxygen, wean off to keep on his home dose of 2-3 L of oxygen.

## 2016-03-01 ENCOUNTER — Non-Acute Institutional Stay (SKILLED_NURSING_FACILITY): Payer: Medicare Other | Admitting: Internal Medicine

## 2016-03-01 ENCOUNTER — Encounter: Payer: Self-pay | Admitting: Internal Medicine

## 2016-03-01 DIAGNOSIS — J9611 Chronic respiratory failure with hypoxia: Secondary | ICD-10-CM

## 2016-03-01 DIAGNOSIS — I272 Other secondary pulmonary hypertension: Secondary | ICD-10-CM

## 2016-03-01 DIAGNOSIS — G4733 Obstructive sleep apnea (adult) (pediatric): Secondary | ICD-10-CM

## 2016-03-01 DIAGNOSIS — J438 Other emphysema: Secondary | ICD-10-CM

## 2016-03-01 DIAGNOSIS — E785 Hyperlipidemia, unspecified: Secondary | ICD-10-CM | POA: Diagnosis not present

## 2016-03-01 DIAGNOSIS — I10 Essential (primary) hypertension: Secondary | ICD-10-CM | POA: Diagnosis not present

## 2016-03-01 DIAGNOSIS — K219 Gastro-esophageal reflux disease without esophagitis: Secondary | ICD-10-CM | POA: Diagnosis not present

## 2016-03-01 DIAGNOSIS — N400 Enlarged prostate without lower urinary tract symptoms: Secondary | ICD-10-CM

## 2016-03-01 DIAGNOSIS — N401 Enlarged prostate with lower urinary tract symptoms: Secondary | ICD-10-CM

## 2016-03-01 DIAGNOSIS — R338 Other retention of urine: Secondary | ICD-10-CM

## 2016-03-01 NOTE — Progress Notes (Signed)
MRN: [id number] Name: Andrew [name]  Sex: male Age: 75 y.o. DOB: Dec 22, 1940  [place] [facility] Facility/Room: 104 Level Of Care: SNF Provider: Inocencio Homes D Emergency Contacts: Extended Emergency Contact Information Primary Emergency Contact: [place] Phone: [phone] Relation: Daughter  Code Status:   Allergies: Review of patient's allergies indicates no known allergies.  Chief Complaint  Patient presents with  . Discharge Note    HPI: Patient is 75 y.o. male with HTN, hyperlipidemia, pulmonary hypertension, COPD/emphysema who comes to the emergency department via EMS due to shortness of breath FOR 1 DAY. PT admitted to [facility] from 3/26-30 were he was treated for acute COPD exacerbation. Hospital course was complicated by acute urinary retention requiring a foley. Pt was admitted to SNF for generalized weakness for OT/PT andis now ready to be d/c to home.  Past Medical History  Diagnosis Date  . Emphysema lung ([place])   . Hypertension   . Pulmonary hypertension [facility])     Past Surgical History  Procedure Laterality Date  . Tonsillectomy    . Lung surgery        Medication List       This list is accurate as of: 03/01/16  4:46 PM.  Always use your most recent med list.               ADVAIR DISKUS 250-50 MCG/DOSE Aepb  Generic drug:  Fluticasone-Salmeterol  Inhale 1 puff into the lungs 2 (two) times daily.     albuterol 108 (90 Base) MCG/ACT inhaler  Commonly known as:  PROVENTIL HFA;VENTOLIN HFA  Inhale 2 puffs into the lungs every 6 (six) hours as needed for wheezing or shortness of breath.     CALCIUM PO  Take 500 mg by mouth daily.     dextromethorphan-guaiFENesin 30-600 MG 12hr tablet  Commonly known as:  MUCINEX DM  Take 1 tablet by mouth 2 (two) times daily.     diltiazem 240 MG 24 hr capsule  Commonly known as:  TIAZAC  Take 240 mg by mouth daily.     esomeprazole 20 MG capsule  Commonly known as:  NEXIUM   Take 20 mg by mouth 2 (two) times daily before a meal.     finasteride 5 MG tablet  Commonly known as:  PROSCAR  Take 5 mg by mouth daily.     levofloxacin 750 MG tablet  Commonly known as:  LEVAQUIN  Take 1 tablet (750 mg total) by mouth daily.     multivitamin with minerals tablet  Take 1 tablet by mouth daily.     nystatin 100000 UNIT/ML suspension  Commonly known as:  MYCOSTATIN  Take 6 mLs by mouth 4 (four) times daily. Reported on 01/10/2016     polyethylene glycol packet  Commonly known as:  MIRALAX / GLYCOLAX  Take 17 g by mouth at bedtime.     predniSONE 10 MG tablet  Commonly known as:  DELTASONE  Take 4 tabs PO for 2 days, then take 3 tabs PO for 2 days, then take 2 tabs PO for 2 days, then take 1 tabs PO for 2 days, then stop.     RESTORIL 30 MG capsule  Generic drug:  temazepam  Take 30 mg by mouth at bedtime.     rosuvastatin 10 MG tablet  Commonly known as:  CRESTOR  Take 10 mg by mouth daily.     sildenafil 20 MG tablet  Commonly known as:  REVATIO  Take  20 mg by mouth 2 (two) times daily.     SPIRIVA HANDIHALER 18 MCG inhalation capsule  Generic drug:  tiotropium  Place 1 capsule (18 mcg total) into inhaler and inhale daily.     tamsulosin 0.4 MG Caps capsule  Commonly known as:  FLOMAX  Take 1 capsule (0.4 mg total) by mouth daily after breakfast.     VITAMIN C PO  Take 500 mg by mouth daily.     vitamin E 1000 UNIT capsule  Take 1,000 Units by mouth daily.        No orders of the defined types were placed in this encounter.    Immunization History  Administered Date(s) Administered  . Influenza-Unspecified 09/19/2015  . PPD Test 02/15/2016  . Pneumococcal Conjugate-13 09/19/2015    Social History  Substance Use Topics  . Smoking status: Former Smoker -- 0.00 packs/day for 0 years    Types: Cigarettes    Quit date: 10/01/1984  . Smokeless tobacco: Not on file  . Alcohol Use: No    Filed Vitals:   03/01/16 1642  BP: 133/74   Pulse: 68  Temp: 97.6 F (36.4 C)  Resp: 18    Physical Exam  GENERAL APPEARANCE: Alert, conversant. No acute distress.  HEENT: Unremarkable. RESPIRATORY: Breathing is even, unlabored. Lung sounds are clear   CARDIOVASCULAR: Heart RRR no murmurs, rubs or gallops. No peripheral edema.  GASTROINTESTINAL: Abdomen is soft, non-tender, not distended w/ normal bowel sounds.  NEUROLOGIC: Cranial nerves 2-12 grossly intact. Moves all extremities  Patient Active Problem List   Diagnosis Date Noted  . Pulmonary hypertension ([place]) 02/20/2016  . OSA (obstructive sleep apnea) 02/20/2016  . GERD (gastroesophageal reflux disease) 02/20/2016  . COPD exacerbation ([place]) 02/13/2016  . Acute respiratory failure with hypoxia ([place]) 02/12/2016  . Leukocytosis 02/11/2016  . Hypertension 02/11/2016  . Hyperlipidemia 02/11/2016  . CAP (community acquired pneumonia)   . BPH (benign prostatic hypertrophy) with urinary retention 12/29/2015  . Sepsis ([place]) 12/28/2015  . Acute on chronic respiratory failure with hypoxia ([place]) 12/28/2015  . COPD with emphysema ([place]) 10/02/2015  . Chronic hypoxemic respiratory failure ([place]) 10/02/2015  . History of pulmonary hypertension 10/02/2015    CBC    Component Value Date/Time   WBC 19.1* 02/13/2016 0330   RBC 3.42* 02/13/2016 0330   HGB 10.4* 02/13/2016 0330   HCT 31.3* 02/13/2016 0330   PLT 281 02/13/2016 0330   MCV 91.5 02/13/2016 0330   LYMPHSABS 0.4* 02/13/2016 0330   MONOABS 0.5 02/13/2016 0330   EOSABS 0.0 02/13/2016 0330   BASOSABS 0.0 02/13/2016 0330    CMP     Component Value Date/Time   NA 140 02/13/2016 0330   K 4.4 02/13/2016 0330   CL 107 02/13/2016 0330   CO2 25 02/13/2016 0330   GLUCOSE 166* 02/13/2016 0330   BUN 19 02/13/2016 0330   CREATININE 0.69 02/13/2016 0330   CALCIUM 8.8* 02/13/2016 0330   PROT 6.6 02/12/2016 0327   ALBUMIN 3.4* 02/12/2016 0327   AST 14* 02/12/2016 0327   ALT 18 02/12/2016 0327   ALKPHOS 72 02/12/2016  0327   BILITOT 0.6 02/12/2016 0327   GFRNONAA >60 02/13/2016 0330   GFRAA >60 02/13/2016 0330    Assessment and Plan  Pt is d/c to home with HH/OT/PT/Nursing. Medications have been reconciled and Rx's written.  Hennie Duos, MD

## 2016-03-04 ENCOUNTER — Ambulatory Visit: Payer: Medicare Other | Admitting: Pulmonary Disease

## 2016-03-06 DIAGNOSIS — I10 Essential (primary) hypertension: Secondary | ICD-10-CM | POA: Diagnosis not present

## 2016-03-06 DIAGNOSIS — Z8701 Personal history of pneumonia (recurrent): Secondary | ICD-10-CM | POA: Diagnosis not present

## 2016-03-06 DIAGNOSIS — J9621 Acute and chronic respiratory failure with hypoxia: Secondary | ICD-10-CM | POA: Diagnosis not present

## 2016-03-06 DIAGNOSIS — I272 Other secondary pulmonary hypertension: Secondary | ICD-10-CM | POA: Diagnosis not present

## 2016-03-06 DIAGNOSIS — J449 Chronic obstructive pulmonary disease, unspecified: Secondary | ICD-10-CM | POA: Diagnosis not present

## 2016-03-06 DIAGNOSIS — R339 Retention of urine, unspecified: Secondary | ICD-10-CM | POA: Diagnosis not present

## 2016-03-07 DIAGNOSIS — L89151 Pressure ulcer of sacral region, stage 1: Secondary | ICD-10-CM | POA: Diagnosis not present

## 2016-03-07 DIAGNOSIS — I10 Essential (primary) hypertension: Secondary | ICD-10-CM | POA: Diagnosis not present

## 2016-03-07 DIAGNOSIS — K219 Gastro-esophageal reflux disease without esophagitis: Secondary | ICD-10-CM | POA: Diagnosis not present

## 2016-03-07 DIAGNOSIS — E785 Hyperlipidemia, unspecified: Secondary | ICD-10-CM | POA: Diagnosis not present

## 2016-03-07 DIAGNOSIS — J441 Chronic obstructive pulmonary disease with (acute) exacerbation: Secondary | ICD-10-CM | POA: Diagnosis not present

## 2016-03-07 DIAGNOSIS — R339 Retention of urine, unspecified: Secondary | ICD-10-CM | POA: Diagnosis not present

## 2016-03-07 DIAGNOSIS — N400 Enlarged prostate without lower urinary tract symptoms: Secondary | ICD-10-CM | POA: Diagnosis not present

## 2016-03-07 DIAGNOSIS — J439 Emphysema, unspecified: Secondary | ICD-10-CM | POA: Diagnosis not present

## 2016-03-07 DIAGNOSIS — I272 Other secondary pulmonary hypertension: Secondary | ICD-10-CM | POA: Diagnosis not present

## 2016-03-07 DIAGNOSIS — G4733 Obstructive sleep apnea (adult) (pediatric): Secondary | ICD-10-CM | POA: Diagnosis not present

## 2016-03-07 DIAGNOSIS — Z8701 Personal history of pneumonia (recurrent): Secondary | ICD-10-CM | POA: Diagnosis not present

## 2016-03-07 DIAGNOSIS — J9621 Acute and chronic respiratory failure with hypoxia: Secondary | ICD-10-CM | POA: Diagnosis not present

## 2016-03-07 DIAGNOSIS — Z87891 Personal history of nicotine dependence: Secondary | ICD-10-CM | POA: Diagnosis not present

## 2016-03-11 DIAGNOSIS — G4700 Insomnia, unspecified: Secondary | ICD-10-CM | POA: Diagnosis not present

## 2016-03-11 DIAGNOSIS — J441 Chronic obstructive pulmonary disease with (acute) exacerbation: Secondary | ICD-10-CM | POA: Diagnosis not present

## 2016-03-11 DIAGNOSIS — K219 Gastro-esophageal reflux disease without esophagitis: Secondary | ICD-10-CM | POA: Diagnosis not present

## 2016-03-11 DIAGNOSIS — J9621 Acute and chronic respiratory failure with hypoxia: Secondary | ICD-10-CM | POA: Diagnosis not present

## 2016-03-11 DIAGNOSIS — J439 Emphysema, unspecified: Secondary | ICD-10-CM | POA: Diagnosis not present

## 2016-03-11 DIAGNOSIS — R339 Retention of urine, unspecified: Secondary | ICD-10-CM | POA: Diagnosis not present

## 2016-03-11 DIAGNOSIS — I272 Other secondary pulmonary hypertension: Secondary | ICD-10-CM | POA: Diagnosis not present

## 2016-03-11 DIAGNOSIS — I10 Essential (primary) hypertension: Secondary | ICD-10-CM | POA: Diagnosis not present

## 2016-03-11 DIAGNOSIS — N400 Enlarged prostate without lower urinary tract symptoms: Secondary | ICD-10-CM | POA: Diagnosis not present

## 2016-03-11 DIAGNOSIS — Z79899 Other long term (current) drug therapy: Secondary | ICD-10-CM | POA: Diagnosis not present

## 2016-03-11 DIAGNOSIS — L89151 Pressure ulcer of sacral region, stage 1: Secondary | ICD-10-CM | POA: Diagnosis not present

## 2016-03-12 DIAGNOSIS — J9621 Acute and chronic respiratory failure with hypoxia: Secondary | ICD-10-CM | POA: Diagnosis not present

## 2016-03-12 DIAGNOSIS — I272 Other secondary pulmonary hypertension: Secondary | ICD-10-CM | POA: Diagnosis not present

## 2016-03-12 DIAGNOSIS — L89151 Pressure ulcer of sacral region, stage 1: Secondary | ICD-10-CM | POA: Diagnosis not present

## 2016-03-12 DIAGNOSIS — I10 Essential (primary) hypertension: Secondary | ICD-10-CM | POA: Diagnosis not present

## 2016-03-12 DIAGNOSIS — R339 Retention of urine, unspecified: Secondary | ICD-10-CM | POA: Diagnosis not present

## 2016-03-12 DIAGNOSIS — J441 Chronic obstructive pulmonary disease with (acute) exacerbation: Secondary | ICD-10-CM | POA: Diagnosis not present

## 2016-03-13 DIAGNOSIS — I10 Essential (primary) hypertension: Secondary | ICD-10-CM | POA: Diagnosis not present

## 2016-03-13 DIAGNOSIS — R339 Retention of urine, unspecified: Secondary | ICD-10-CM | POA: Diagnosis not present

## 2016-03-13 DIAGNOSIS — L89151 Pressure ulcer of sacral region, stage 1: Secondary | ICD-10-CM | POA: Diagnosis not present

## 2016-03-13 DIAGNOSIS — I272 Other secondary pulmonary hypertension: Secondary | ICD-10-CM | POA: Diagnosis not present

## 2016-03-13 DIAGNOSIS — J9621 Acute and chronic respiratory failure with hypoxia: Secondary | ICD-10-CM | POA: Diagnosis not present

## 2016-03-13 DIAGNOSIS — J441 Chronic obstructive pulmonary disease with (acute) exacerbation: Secondary | ICD-10-CM | POA: Diagnosis not present

## 2016-03-14 DIAGNOSIS — L89151 Pressure ulcer of sacral region, stage 1: Secondary | ICD-10-CM | POA: Diagnosis not present

## 2016-03-14 DIAGNOSIS — J9621 Acute and chronic respiratory failure with hypoxia: Secondary | ICD-10-CM | POA: Diagnosis not present

## 2016-03-14 DIAGNOSIS — I272 Other secondary pulmonary hypertension: Secondary | ICD-10-CM | POA: Diagnosis not present

## 2016-03-14 DIAGNOSIS — I10 Essential (primary) hypertension: Secondary | ICD-10-CM | POA: Diagnosis not present

## 2016-03-14 DIAGNOSIS — J441 Chronic obstructive pulmonary disease with (acute) exacerbation: Secondary | ICD-10-CM | POA: Diagnosis not present

## 2016-03-14 DIAGNOSIS — R339 Retention of urine, unspecified: Secondary | ICD-10-CM | POA: Diagnosis not present

## 2016-03-15 DIAGNOSIS — J441 Chronic obstructive pulmonary disease with (acute) exacerbation: Secondary | ICD-10-CM | POA: Diagnosis not present

## 2016-03-15 DIAGNOSIS — J9621 Acute and chronic respiratory failure with hypoxia: Secondary | ICD-10-CM | POA: Diagnosis not present

## 2016-03-15 DIAGNOSIS — R339 Retention of urine, unspecified: Secondary | ICD-10-CM | POA: Diagnosis not present

## 2016-03-15 DIAGNOSIS — I10 Essential (primary) hypertension: Secondary | ICD-10-CM | POA: Diagnosis not present

## 2016-03-15 DIAGNOSIS — L89151 Pressure ulcer of sacral region, stage 1: Secondary | ICD-10-CM | POA: Diagnosis not present

## 2016-03-15 DIAGNOSIS — I272 Other secondary pulmonary hypertension: Secondary | ICD-10-CM | POA: Diagnosis not present

## 2016-03-16 DIAGNOSIS — L89151 Pressure ulcer of sacral region, stage 1: Secondary | ICD-10-CM | POA: Diagnosis not present

## 2016-03-16 DIAGNOSIS — J441 Chronic obstructive pulmonary disease with (acute) exacerbation: Secondary | ICD-10-CM | POA: Diagnosis not present

## 2016-03-16 DIAGNOSIS — J9621 Acute and chronic respiratory failure with hypoxia: Secondary | ICD-10-CM | POA: Diagnosis not present

## 2016-03-16 DIAGNOSIS — I10 Essential (primary) hypertension: Secondary | ICD-10-CM | POA: Diagnosis not present

## 2016-03-16 DIAGNOSIS — I272 Other secondary pulmonary hypertension: Secondary | ICD-10-CM | POA: Diagnosis not present

## 2016-03-16 DIAGNOSIS — R339 Retention of urine, unspecified: Secondary | ICD-10-CM | POA: Diagnosis not present

## 2016-03-18 ENCOUNTER — Encounter: Payer: Self-pay | Admitting: Pulmonary Disease

## 2016-03-18 ENCOUNTER — Ambulatory Visit (INDEPENDENT_AMBULATORY_CARE_PROVIDER_SITE_OTHER): Payer: Medicare Other | Admitting: Pulmonary Disease

## 2016-03-18 VITALS — BP 118/64 | HR 78 | Temp 98.0°F | Ht 67.0 in | Wt 133.4 lb

## 2016-03-18 DIAGNOSIS — R600 Localized edema: Secondary | ICD-10-CM | POA: Diagnosis not present

## 2016-03-18 DIAGNOSIS — J441 Chronic obstructive pulmonary disease with (acute) exacerbation: Secondary | ICD-10-CM

## 2016-03-18 DIAGNOSIS — G4733 Obstructive sleep apnea (adult) (pediatric): Secondary | ICD-10-CM | POA: Diagnosis not present

## 2016-03-18 DIAGNOSIS — J9611 Chronic respiratory failure with hypoxia: Secondary | ICD-10-CM

## 2016-03-18 DIAGNOSIS — I272 Other secondary pulmonary hypertension: Secondary | ICD-10-CM | POA: Diagnosis not present

## 2016-03-18 DIAGNOSIS — J432 Centrilobular emphysema: Secondary | ICD-10-CM

## 2016-03-18 MED ORDER — IPRATROPIUM-ALBUTEROL 0.5-2.5 (3) MG/3ML IN SOLN: 3.0000 mL | Freq: Four times a day (QID) | RESPIRATORY_TRACT | Status: DC | PRN

## 2016-03-18 NOTE — Patient Instructions (Signed)
Today we updated your med list in our EPIC system...    Continue your current medications the same...  We decided to change your NEBULIZER medication from plain Albuterol to the combination DUONEB>    Use in NEBULIZER three times daily followed by the [place] as we discussed...  It looks like you will need a diuretic added to eliminate the edema>    Be sure to avoid sodium (salt) in your diet...    Your PCP should prescribe a diuretic- let me know if you have any questions...  Try to stay as active as possible, but avoid upper resp infections!!!  Call for any questions...  Let's plan a follow up visit in 56mo, sooner if needed for problems.Marland KitchenMarland Kitchen

## 2016-03-19 ENCOUNTER — Encounter: Payer: Self-pay | Admitting: Pulmonary Disease

## 2016-03-19 DIAGNOSIS — I10 Essential (primary) hypertension: Secondary | ICD-10-CM | POA: Diagnosis not present

## 2016-03-19 DIAGNOSIS — I272 Other secondary pulmonary hypertension: Secondary | ICD-10-CM | POA: Diagnosis not present

## 2016-03-19 DIAGNOSIS — J441 Chronic obstructive pulmonary disease with (acute) exacerbation: Secondary | ICD-10-CM | POA: Diagnosis not present

## 2016-03-19 DIAGNOSIS — J9621 Acute and chronic respiratory failure with hypoxia: Secondary | ICD-10-CM | POA: Diagnosis not present

## 2016-03-19 DIAGNOSIS — R339 Retention of urine, unspecified: Secondary | ICD-10-CM | POA: Diagnosis not present

## 2016-03-19 DIAGNOSIS — L89151 Pressure ulcer of sacral region, stage 1: Secondary | ICD-10-CM | POA: Diagnosis not present

## 2016-03-19 NOTE — Progress Notes (Signed)
Subjective:     Patient ID: Andrew [name], male   DOB: 1941/01/28, 75 y.o.   MRN: [id number]  HPI  ~  October 02, 2015:  Initial pulmonary evaluation by SN>  His PCP is Dr. Lujean Amel, [name]...       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  He describes himself as being rock-solid stable on this exact regimen since 2007- he had Cath (?left & right heart) in 2007, told 1 blockage, good LVF ?right heart results, started on O2, BiPAP, and Revatio but he does not know why?  He notes min cough when supine & in early AM attributed to reflux; min if any sput production, no hemoptysis, he denies SOB but states DOE "if I over-exert" eg- walking, lifting/carrying, stairs, etc; he notes that ADLs are ok- no problem (he is stoic);  He denies CP, palpit, f/c/s, edema... He hasn't been to an ER since 2007 he says & that was also the last time he had any Pred; he thinks he had CXR, PFT, 2DEcho all earlier this yr...   Smoking Hx>  He is an ex-smoker, started in his teens, smoked for 30 yrs up to 1ppd, quit in 1986; This is a 30 pack-yr hx, he does not recall ever being checked for A1AT defic...  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 at 2L/min since 2007;  He tells me that he was started on BiPAP about that same time but he doesn't know why- never had sleep study, not on CPAP prev, he does not know about pCO2 levels etc ("I like the fresh cool air");  His BiPAP came from [facility] in [place]- states he does not know the settings, machine never downloaded, etc;  He has also been on Revatio20Tid since 2007, apparently never tried on other meds, dose never adjusted- he knows about "pulmonary hypertension" but he doesn't know any details and it doesn't appear to have been followed up, and meds kept the same from doctor to doctor...   Medical Hx>  HBP, ?nonobstructive CAD, HL, thyroid nodule, GERD, constipation, BPH, insomnia...  Family Hx>  Father died w/ Emphysema & was a former smoker; no other hx lung dis in the family; Alpha-1 status is unknown...  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   Current Meds>  Oxygen  2L/min pulse-dose concentrator, Advair250Bid, Spiriva daily, Revatio20Tid, CardizemCD240, Crestor10, Nexium20, Proscar5, Restoril30...  EXAM shows Afeb, VSS, O2sat=93% on 2L/min pulse-dose;  Heent- neg, mallampati1;  Chest- decr BS at bases, can't augment BS voluntarily, w/o w/r/r;  Heart- RR w/o m/r/g;  Abd- soft, neg;  Ext- neg w/o c/c/e;  Neuro- intact...  CXR 10/02/15 showed norm heart size, COPD, bullous emphysema/ hyperinflation, scarring right apex, NAD...   Spirometry 10/02/15 showed FVC=2.70 (69%), FEV1=1.12 (37%), %1sec=41, mid-flows reduced at 18% predicted; this is c/w severe airflow obstruction & GOLD Stage 3 COPD  Ambulatory oxygen saturation test 10/02/15> on O2 at 2L/min pulse-dose concentrator: O2sat=96% on 2L  at rest w/ pulse=87; he walked 2 laps w/ his O2, stopped due to dyspnea, lowest O2sat=89% w/ pulse=113/min...  LAB 10/02/15>  Alpha-1-Antitrypsin level => pending (he never went to the lab for this blood test)  2DEchocardiogram 10/09/15 showed norm LVF w/ EF=55-60%, norm wall motion, mild MR, mild RA dil, PAsys est 9mmHg... Pt on Revatio 20Tid x 83yrs & I rec we wean slowly (Decr to Bid now)...    IMP >>     COPD/ bullous emphysema> s/p RUL "bleb-ectomy" in 2007, severe airflow obstruction w/ GOLD Stage3 COPD> on Advair250Bid & Spiriva daily; apparently he has no use for a rescue inhaler; prev on NEB w/ Albut but not for several yrs.     Chronic hypoxemic respiratory failure on O2 at 2L/min via pulse-dose concentrator...    Pt reports using BiPAP since 2007, never been on CPAP, never had sleep study he says, unknown ABGs or pCO2 data; machine from [place] we will try to get the old data => none received.    Hx pulmonary hypertension on Revatio20Tid since 2007 w/o additional med trials or dose adjustments> we do not have any of the objective data from his prev physician teams... Current 2DEcho w/ PAsys est 48mmHg & we will wean the Revatio to Bid at this point => he does not want to wean further for "other" reasons...    Medical issues include:  HBP, ?nonobstructive CAD, HL, thyroid nodule, GERD, BPH, insomnia... PLAN >>     Andrew [name] has a distinctly patchy history to go along w/ his severe airflow obstruction & bullous emphysema;  We really need his old objective data from 2007 when he was started on O2 & BiPAP after his RUL bleb reduction surg;  He will try to get names and numbers for [place]- in the meanwhile we will contact his last physician in [facility] for their more recent data as we establish out data base here in [place];  He is very concerned that he wants [place] to continue his current regimen which has served him well over the last 74yrs;  Continue Advair250Bid, Spiriva daily,  Revatio20Tid=>Bid, O2 at 2L/min, and the BiPAP nightly as currently set... We plan ROV recheck in 1 month... NOTE> 2DEcho w/ PAsys est ~9mmHg & we will slowly wean Revatio...  ~  November 01, 2015:  68mo ROV & Andrew [name] reports stable, doing satis & notes no untoward effects from cutting the Revatio to 20mg Bid; he denies CP, palpit, incr SOB, edema, etc; he continues on the O2 at 2L/min, BiPAP from [employer], [place] once daily; we have not received any records from his mult physicians ([place], [place], [place])...    EXAM shows Afeb, VSS, O2sat=92% on 2L/min pulse-dose;  Heent- neg, mallampati1;  Chest- decr BS at bases, can't augment BS voluntarily, w/o w/r/r;  Heart- RR w/o m/r/g;  Abd-  soft, neg;  Ext- neg w/o c/c/e;  Neuro- intact... IMP/PLAN>>  See prob list above- he is stable on this regimen but does not want to wean the Revatio further for "other" reasons; OK to continue current med regimen- advised regular exercise vs pulm rehab program; given ZPak for prn use over the winter & he knows to call for any resp issues, incr dyspnea, etc... He remains on BiPAP but we do not have any data- no records received from prev pulm physicians, no notes from [place], no download data from his machine- we will again try to make contact w/ his DME company... We plan ROV in 3-87mo.  ~  Mar 18, 2016:  4-1mo ROV & post hosp visit>  Andrew [name] has severe COPD/Emphysema, GOLD Stage 3 w/ FEV1=1.12L (37%predicted) in [id number],  Hx right thoracotomy & "bleb-ectomy" in 2007;  after this procedure he says he was placed on Oxygen at 2L/min and BiPAP to use at night, along w/ ADVAIR250Bid, SPIRIVA daily, and REVATIO20Tid for pulmonary HTN (2DEcho here 11/16 showed PAsys est=69mmHg)-- we do not have any of that data from [place] (we have been unsuccessful in obtaining old data from any of his prev physicians); he has been resistent to any adjustment in his medication regimen for various reasons...     He's been [place]  x2 since last OV> 1st [facility] 2/9 - 01/03/16 by [facility] w/ CAP- CXR showed severe bullous emphysema, incr markings in RLL and atelectasis in R-mid lung, Temp 103, Lactate=2.4, WBC 16K; treated w/ O2, Solumedrol=>Pred, Zosyn/Vanco=>Levaquin, NEBs, etc; disch home w/ home health help- ?seen by his PCP after disch...    2nd [facility] 3/26 - 02/15/16 by [facility] w/ 1d hx incr SOB, wheezing, productive cough & felt to have a COPD exac; CXR showed his COPE/E, persistent incr markings in right base, WBC was elev at 20K, BNP=60, no pos cultures; he was treaed w/ O2, Solumedrol, Roceph/Zithro, NEBs, etc; he was disch on Levaquin & Pred; he developed urinary retention & foley placed (weaned off in NH); he was debilitated & sent to NH for rehab...     He was disch to [facility] for rehab & attended by [facility] note dated 03/01/16 reviewed-- finished Levaquin, weaned off the Pred, they were able to discontinue the foley & he passed voiding trial; disch home after 19d in rehab...     Now back home on same O2= 2L/min days & 3L/min night w/ BiPAP ?settings (he has been on this for yrs and never re-assessed), NEB w/ AlbutTid, Advair250Bid, Spiriva daily, Revatio20Bid (pt refuses to taper this med further);  He has developed pedal edema x3d & needs a low sodium diet + diuretic started but he tells me he is sched to see his PCP soon for this problem...    EXAM shows Afeb, VSS, O2sat=95% on 2L/min pulse-dose;  Heent- neg, mallampati1;  Chest- decr BS at bases, can't augment BS voluntarily, w/o w/r/r;  Heart- RR w/o m/r/g;  Abd- soft, neg;  Ext- neg w/o c/c/e;  Neuro- intact...  CXR 02/11/16 showed hyperinflation, bullous emphysema, some scarring in right mid lung & both lower lobes, no infiltrate, no edema...  LABS 01/2016> all reviewed in Epic... IMP/PLAN>>  Andrew [name] has had a protracted resp exac- triggered by prob RLL pneumonia (NOS) superimposed on his severe COPD/bullous emphysema;  Rec to change the Albut for Neb to [place] &  continue treatments Tid; continue other meds regularly as outlined;  He is referred to [facility] & hopes to start  soon;  We will plan ROV in 57mo sooner if needed.    Past Medical History  Diagnosis Date  . Emphysema lung ([place])   . Hypertension   . Pulmonary hypertension ([facility])   Medical Hx>  HBP, ?nonobstructive CAD, HL, thyroid nodule, GERD, constipation, BPH, insomnia... Meds include>  CardizemCD240, Crestor10, Nexium20, Miralax, Proscar10, Restoril30...   Past Surgical History  Procedure Laterality Date  . Tonsillectomy    . Lung surgery    Hx Thoracotomy & RUL Bleb-ectomy 2007 in [place], [place]...   Outpatient Encounter Prescriptions as of 11/01/2015  Medication Sig  . calcium citrate (CALCITRATE - DOSED IN MG ELEMENTAL CALCIUM) 950 MG tablet Take 200 mg of elemental calcium by mouth daily.  Marland Kitchen diltiazem (CARDIZEM LA) 120 MG 24 hr tablet Take 240 mg by mouth daily.  Marland Kitchen esomeprazole (NEXIUM) 20 MG capsule Take 20 mg by mouth daily at 12 noon.  . finasteride (PROSCAR) 5 MG tablet Take 10 mg by mouth daily.  . Multiple Vitamins-Minerals (MULTIVITAMIN WITH MINERALS) tablet Take 1 tablet by mouth daily.  . polyethylene glycol (MIRALAX / GLYCOLAX) packet Take 17 g by mouth at bedtime.  . rosuvastatin (CRESTOR) 10 MG tablet Take 10 mg by mouth daily.  . sildenafil (REVATIO) 20 MG tablet Take 20 mg by mouth 2 (two) times daily.   . temazepam (RESTORIL) 30 MG capsule Take 30 mg by mouth at bedtime.  . vitamin E 1000 UNIT capsule Take 1,000 Units by mouth daily.  Marland Kitchen ADVAIR DISKUS 250-50 MCG/DOSE AEPB Inhale 1 puff into the lungs 2 (two) times daily.  Marland Kitchen SPIRIVA HANDIHALER 18 MCG inhalation capsule Place 1 capsule (18 mcg total) into inhaler and inhale daily.    No Known Allergies   Current Medications, Allergies, Past Medical History, Past Surgical History, Family History, and Social History were reviewed in [employer] record.   Review of Systems              All symptoms NEG except where BOLDED >>  Constitutional:  F/C/S, fatigue, anorexia, unexpected weight change. HEENT:  HA, visual changes, hearing loss, earache, nasal symptoms, sore throat, mouth sores, hoarseness. Resp:  cough, sputum, hemoptysis; SOB, tightness, wheezing. Cardio:  CP, palpit, DOE, orthopnea, edema. GI:  N/V/D/C, blood in stool; reflux, abd pain, distention, gas. GU:  dysuria, freq, urgency, hematuria, flank pain, voiding difficulty. MS:  joint pain, swelling, tenderness, decr ROM; neck pain, back pain, etc. Neuro:  HA, tremors, seizures, dizziness, syncope, weakness, numbness, gait abn. Skin:  suspicious lesions or skin rash. Heme:  adenopathy, bruising, bleeding. Psyche:  confusion, agitation, sleep disturbance, hallucinations, anxiety, depression suicidal.   Objective:   Physical Exam       Vital Signs:  Reviewed...   General:  WD, WN, 75 y/o WM in NAD; alert & oriented; pleasant & cooperative... HEENT:  [place]/AT; Conjunctiva- pink, Sclera- nonicteric, EOM-wnl, PERRLA, EACs-clear, TMs-wnl; NOSE-clear; THROAT-clear & wnl.  Neck:  Supple w/ fair ROM; no JVD; normal carotid impulses w/o bruits; no thyromegaly +small nodule palpated; no lymphadenopathy.  Chest:  Overinflated, resonant percussion note, decr BS bilat & can't augment BS voluntarily, no w/r/r heard... Heart:  Regular Rhythm; norm S1 & S2 without murmurs, rubs, or gallops detected. Abdomen:  Soft & nontender- no guarding or rebound; normal bowel sounds; no organomegaly or masses palpated. Ext:  decr ROM; without deformities or arthritic changes; no varicose veins, +venous insuffic, no edema;  Pulses intact w/o bruits. Neuro:  No focal neuro deficits; sensory testing normal;  gait normal & balance OK. Derm:  No lesions noted; no rash etc. Lymph:  No cervical, supraclavicular, axillary, or inguinal adenopathy palpated.   Assessment:      IMP >>     1) COPD/ bullous emphysema> s/p RUL "bleb-ectomy" in 2007,  severe airflow obstruction w/ GOLD Stage3 COPD> on Advair250Bid & Spiriva daily; apparently he has no use for a rescue inhaler; prev on NEB w/ Albut but not for several yrs.     2) Chronic hypoxemic respiratory failure on O2 at 2L/min via pulse-dose concentrator...    3) Pt reports using BiPAP since 2007, never been on CPAP, never had sleep study he says, unknown ABGs or pCO2 data; machine from [facility] we will try to get the old data...    4) Hx pulmonary hypertension on Revatio20Tid since 2007 w/o additional med trials or dose adjustments> we do not have any of the objective data from his prev physician teams.Marland KitchenMarland Kitchen    5) Medical issues include:  HBP, ?nonobstructive CAD, HL, thyroid nodule, GERD, BPH, insomnia...  PLAN >>  10/02/15>   Elysha has a distinctly patchy history to go along w/ his severe airflow obstruction & bullous emphysema;  We really need his old objective data from 2007 when he was started on O2 & BiPAP after his RUL bleb reduction surg;  He will try to get names and numbers for [place]- in the meanwhile we will contact his last physician in [facility] for their more recent data as we establish out data base here in [place];  He is very concerned that he wants [place] to continue his current regimen which has served him well over the last 54yrs;  Continue Advair250Bid, Spiriva daily, Revatio20tid, O2 at 2L/min, and the BiPAP nightly as currently set...  11/01/15>  See prob list above- he is stable on this regimen but does not want to wean the Revatio further for "other" reasons; OK to continue current med regimen- advised regular exercise vs pulm rehab program; given ZPak for prn use over the winter & he knows to call for any resp issues, incr dyspnea, etc... He remains on BiPAP but we do not have any data- no records received from prev pulm physicians, no notes from [place], no download data from his machine- we will again try to make contact w/ his DME company... 03/18/16>   Andrew [name] has had a  protracted resp [place] x2 this spring- triggered by prob RLL pneumonia (NOS) superimposed on his severe COPD/bullous emphysema;  Rec to change the Albut for Neb to [place] & continue treatments Tid; continue other meds regularly as outlined;  He is referred to [facility] & hopes to start soon;  We will plan ROV in 46mo sooner if needed.     Plan:     Patient's Medications  New Prescriptions   IPRATROPIUM-ALBUTEROL (DUONEB) 0.5-2.5 (3) MG/3ML SOLN    Take 3 mLs by nebulization every 6 (six) hours as needed. Dx: J44.9  Previous Medications   ADVAIR DISKUS 250-50 MCG/DOSE AEPB    Inhale 1 puff into the lungs 2 (two) times daily.   ALBUTEROL (PROVENTIL HFA;VENTOLIN HFA) 108 (90 BASE) MCG/ACT INHALER    Inhale 2 puffs into the lungs every 6 (six) hours as needed for wheezing or shortness of breath.   ASCORBIC ACID (VITAMIN C PO)    Take 500 mg by mouth daily.    CALCIUM PO    Take 500 mg by mouth daily.    DILTIAZEM (TIAZAC) 240  MG 24 HR CAPSULE    Take 240 mg by mouth daily.   ESOMEPRAZOLE (NEXIUM) 20 MG CAPSULE    Take 20 mg by mouth 2 (two) times daily before a meal.    FINASTERIDE (PROSCAR) 5 MG TABLET    Take 5 mg by mouth daily.    MULTIPLE VITAMINS-MINERALS (MULTIVITAMIN WITH MINERALS) TABLET    Take 1 tablet by mouth daily.   POLYETHYLENE GLYCOL (MIRALAX / GLYCOLAX) PACKET    Take 17 g by mouth at bedtime.   ROSUVASTATIN (CRESTOR) 10 MG TABLET    Take 10 mg by mouth daily.   SILDENAFIL (REVATIO) 20 MG TABLET    Take 20 mg by mouth 2 (two) times daily.    SPIRIVA HANDIHALER 18 MCG INHALATION CAPSULE    Place 1 capsule (18 mcg total) into inhaler and inhale daily.   TAMSULOSIN (FLOMAX) 0.4 MG CAPS CAPSULE    Take 1 capsule (0.4 mg total) by mouth daily after breakfast.   TEMAZEPAM (RESTORIL) 30 MG CAPSULE    Take 30 mg by mouth at bedtime.   VITAMIN E 1000 UNIT CAPSULE    Take 1,000 Units by mouth daily.  Modified Medications   No medications on file  Discontinued Medications    ALBUTEROL (PROVENTIL) (2.5 MG/3ML) 0.083% NEBULIZER SOLUTION    Inhale 3 mLs into the lungs 3 (three) times daily.   DEXTROMETHORPHAN-GUAIFENESIN (MUCINEX DM) 30-600 MG 12HR TABLET    Take 1 tablet by mouth 2 (two) times daily.

## 2016-03-20 DIAGNOSIS — R339 Retention of urine, unspecified: Secondary | ICD-10-CM | POA: Diagnosis not present

## 2016-03-20 DIAGNOSIS — J9621 Acute and chronic respiratory failure with hypoxia: Secondary | ICD-10-CM | POA: Diagnosis not present

## 2016-03-20 DIAGNOSIS — I272 Other secondary pulmonary hypertension: Secondary | ICD-10-CM | POA: Diagnosis not present

## 2016-03-20 DIAGNOSIS — L89151 Pressure ulcer of sacral region, stage 1: Secondary | ICD-10-CM | POA: Diagnosis not present

## 2016-03-20 DIAGNOSIS — J441 Chronic obstructive pulmonary disease with (acute) exacerbation: Secondary | ICD-10-CM | POA: Diagnosis not present

## 2016-03-20 DIAGNOSIS — I10 Essential (primary) hypertension: Secondary | ICD-10-CM | POA: Diagnosis not present

## 2016-03-21 DIAGNOSIS — L89151 Pressure ulcer of sacral region, stage 1: Secondary | ICD-10-CM | POA: Diagnosis not present

## 2016-03-21 DIAGNOSIS — I272 Other secondary pulmonary hypertension: Secondary | ICD-10-CM | POA: Diagnosis not present

## 2016-03-21 DIAGNOSIS — J441 Chronic obstructive pulmonary disease with (acute) exacerbation: Secondary | ICD-10-CM | POA: Diagnosis not present

## 2016-03-21 DIAGNOSIS — I10 Essential (primary) hypertension: Secondary | ICD-10-CM | POA: Diagnosis not present

## 2016-03-21 DIAGNOSIS — R339 Retention of urine, unspecified: Secondary | ICD-10-CM | POA: Diagnosis not present

## 2016-03-21 DIAGNOSIS — J9621 Acute and chronic respiratory failure with hypoxia: Secondary | ICD-10-CM | POA: Diagnosis not present

## 2016-03-26 ENCOUNTER — Telehealth: Payer: Self-pay | Admitting: *Deleted

## 2016-03-26 NOTE — Telephone Encounter (Signed)
Initiated PA for Xopenex thru [facility]. Key: [place] for review.  [place] (p) [phone]   (f) [phone]

## 2016-03-26 NOTE — Telephone Encounter (Signed)
Ipratropium-Albuterol has been denied by insurance. No alternatives given.

## 2016-03-27 ENCOUNTER — Telehealth: Payer: Self-pay | Admitting: Pulmonary Disease

## 2016-03-27 ENCOUNTER — Ambulatory Visit: Payer: Medicare Other | Admitting: Pulmonary Disease

## 2016-03-27 DIAGNOSIS — I10 Essential (primary) hypertension: Secondary | ICD-10-CM | POA: Diagnosis not present

## 2016-03-27 DIAGNOSIS — I272 Other secondary pulmonary hypertension: Secondary | ICD-10-CM | POA: Diagnosis not present

## 2016-03-27 DIAGNOSIS — J9621 Acute and chronic respiratory failure with hypoxia: Secondary | ICD-10-CM | POA: Diagnosis not present

## 2016-03-27 DIAGNOSIS — L89151 Pressure ulcer of sacral region, stage 1: Secondary | ICD-10-CM | POA: Diagnosis not present

## 2016-03-27 DIAGNOSIS — J441 Chronic obstructive pulmonary disease with (acute) exacerbation: Secondary | ICD-10-CM | POA: Diagnosis not present

## 2016-03-27 DIAGNOSIS — R339 Retention of urine, unspecified: Secondary | ICD-10-CM | POA: Diagnosis not present

## 2016-03-27 NOTE — Telephone Encounter (Signed)
LMTCB

## 2016-03-28 MED ORDER — PREDNISONE 10 MG (21) PO TBPK: ORAL_TABLET | ORAL | Status: DC

## 2016-03-28 NOTE — Telephone Encounter (Signed)
Katlin returned call, CB [phone]

## 2016-03-28 NOTE — Telephone Encounter (Signed)
Katelin called back. She states that patient called triage line last night and is having congestion and a hard time breathing. She states her vm is confidential so you can leave a detailed message. She can be reached at [phone]. -prm

## 2016-03-28 NOTE — Telephone Encounter (Signed)
Spoke with Fara Chute - pt having increased nasal congestion with difficulty breathing at times d/t stuffiness. Pt using humidifier and nasal spray. Denies any use OTC antihistamines.  Please advise Dr Lenna Gilford. Thanks.   No Known Allergies   Medication List       This list is accurate as of: 03/27/16 11:59 PM.  Always use your most recent med list.               ADVAIR DISKUS 250-50 MCG/DOSE Aepb  Generic drug:  Fluticasone-Salmeterol  Inhale 1 puff into the lungs 2 (two) times daily.     albuterol 108 (90 Base) MCG/ACT inhaler  Commonly known as:  PROVENTIL HFA;VENTOLIN HFA  Inhale 2 puffs into the lungs every 6 (six) hours as needed for wheezing or shortness of breath.     CALCIUM PO  Take 500 mg by mouth daily.     diltiazem 240 MG 24 hr capsule  Commonly known as:  TIAZAC  Take 240 mg by mouth daily.     esomeprazole 20 MG capsule  Commonly known as:  NEXIUM  Take 20 mg by mouth 2 (two) times daily before a meal.     finasteride 5 MG tablet  Commonly known as:  PROSCAR  Take 5 mg by mouth daily.     ipratropium-albuterol 0.5-2.5 (3) MG/3ML Soln  Commonly known as:  DUONEB  Take 3 mLs by nebulization every 6 (six) hours as needed. Dx: J44.9     multivitamin with minerals tablet  Take 1 tablet by mouth daily.     polyethylene glycol packet  Commonly known as:  MIRALAX / GLYCOLAX  Take 17 g by mouth at bedtime.     RESTORIL 30 MG capsule  Generic drug:  temazepam  Take 30 mg by mouth at bedtime.     rosuvastatin 10 MG tablet  Commonly known as:  CRESTOR  Take 10 mg by mouth daily.     sildenafil 20 MG tablet  Commonly known as:  REVATIO  Take 20 mg by mouth 2 (two) times daily.     SPIRIVA HANDIHALER 18 MCG inhalation capsule  Generic drug:  tiotropium  Place 1 capsule (18 mcg total) into inhaler and inhale daily.     tamsulosin 0.4 MG Caps capsule  Commonly known as:  FLOMAX  Take 1 capsule (0.4 mg total) by mouth daily after breakfast.     VITAMIN C  PO  Take 500 mg by mouth daily.     vitamin E 1000 UNIT capsule  Take 1,000 Units by mouth daily.

## 2016-03-28 NOTE — Telephone Encounter (Signed)
Called, spoke with Andrew [name].  Discussed below per SN.  Andrew [name] verbalized understanding and is aware we have also spoken with pt regarding recs.  Andrew [name] voiced no further questions or concerns at this time.

## 2016-03-28 NOTE — Telephone Encounter (Signed)
Per SN:  (1)For the nasal congestion, use Afrin 2sprays each nostril BID.  (2)Nasal saline wash 1-2 times a day.  (3)Allegra 180mg  QD.  (4)Pred dose pack 10mg  6day pack take as directed.  (5)Next step is ENT eval.

## 2016-03-28 NOTE — Telephone Encounter (Signed)
Called, spoke with pt.  Discussed below per SN.  Pt verbalized understanding, is in agreement with plan, and will call back if symptoms do not improve for ENT referral.  Pt aware pred pack rx sent to [facility].  lmomtcb for Katlin to advise of below per SN as well

## 2016-03-28 NOTE — Telephone Encounter (Signed)
lmomtcb for Andrew [name] with [facility].

## 2016-04-10 ENCOUNTER — Telehealth: Payer: Self-pay | Admitting: Pulmonary Disease

## 2016-04-10 NOTE — Telephone Encounter (Signed)
Per chart no PFT done. Order for spiro but not completed yet.  LMTCB for [employer]

## 2016-04-11 NOTE — Telephone Encounter (Signed)
lmtcb x2 for [place].

## 2016-04-11 NOTE — Telephone Encounter (Signed)
Spoke with Andrew [name]. She is aware that we do not have a PFT on file for the pt. Andrew [name] will let pulmonary rehab know. Nothing further was needed.

## 2016-04-13 ENCOUNTER — Telehealth: Payer: Self-pay | Admitting: Internal Medicine

## 2016-04-13 NOTE — Telephone Encounter (Signed)
Misunderstood how to use afrin and still on it from 5/10 with rebound issues though  worked initially  so Scientist, physiological otc bid and only use afrin on one designated side for the next 5 days then no more afrin   Change allergra to allegra d prn  F/u ent next week if not all better

## 2016-04-22 ENCOUNTER — Other Ambulatory Visit: Payer: Self-pay | Admitting: Pulmonary Disease

## 2016-04-23 DIAGNOSIS — D1801 Hemangioma of skin and subcutaneous tissue: Secondary | ICD-10-CM | POA: Diagnosis not present

## 2016-04-23 DIAGNOSIS — L570 Actinic keratosis: Secondary | ICD-10-CM | POA: Diagnosis not present

## 2016-04-23 DIAGNOSIS — Z85828 Personal history of other malignant neoplasm of skin: Secondary | ICD-10-CM | POA: Diagnosis not present

## 2016-04-23 DIAGNOSIS — L821 Other seborrheic keratosis: Secondary | ICD-10-CM | POA: Diagnosis not present

## 2016-04-29 ENCOUNTER — Encounter (HOSPITAL_COMMUNITY): Payer: Self-pay

## 2016-04-29 ENCOUNTER — Telehealth: Payer: Self-pay | Admitting: Pulmonary Disease

## 2016-04-29 ENCOUNTER — Telehealth: Payer: Self-pay | Admitting: Internal Medicine

## 2016-04-29 ENCOUNTER — Encounter (HOSPITAL_COMMUNITY)
Admission: RE | Admit: 2016-04-29 | Discharge: 2016-04-29 | Disposition: A | Discharge status: Home / Self Care | Payer: Medicare Other | Source: Ambulatory Visit | Attending: Family Medicine | Admitting: Family Medicine

## 2016-04-29 VITALS — BP 132/55 | HR 92 | Ht 67.0 in | Wt 132.0 lb

## 2016-04-29 DIAGNOSIS — J439 Emphysema, unspecified: Secondary | ICD-10-CM | POA: Insufficient documentation

## 2016-04-29 DIAGNOSIS — Z79899 Other long term (current) drug therapy: Secondary | ICD-10-CM | POA: Diagnosis not present

## 2016-04-29 DIAGNOSIS — Z87891 Personal history of nicotine dependence: Secondary | ICD-10-CM | POA: Diagnosis not present

## 2016-04-29 DIAGNOSIS — R0981 Nasal congestion: Secondary | ICD-10-CM

## 2016-04-29 DIAGNOSIS — I272 Other secondary pulmonary hypertension: Secondary | ICD-10-CM | POA: Insufficient documentation

## 2016-04-29 MED ORDER — METHYLPREDNISOLONE 4 MG PO TBPK: ORAL_TABLET | ORAL | Status: DC

## 2016-04-29 NOTE — Telephone Encounter (Signed)
Per SN: continue with what he is doing and we will call in medrol dosepack  Spoke with the pt and notified of recs and he verbalized understanding  Rx was sent to pharm

## 2016-04-29 NOTE — Telephone Encounter (Signed)
Spoke with the pt  He called earlier today for ENT eval for nasal congestion and this was made  He is now calling asking what to do in the meantime "nose is completely stopped up" He is still taking allegra, nasal washes and taking afrin 3 days on 3 days off  He does not want to call his PCP for this b/c SN has already addressed this issue before  Please advise thanks!

## 2016-04-29 NOTE — Telephone Encounter (Signed)
Spoke with the pt  He stated called in May with c/o's of nasal congestion, and was advised would need ENT ref if not improving  He states that he is needing referral now, he had improved and now worse again   See phone note dated 03/27/16:  Rinaldo Ratel, CMA at 03/28/2016 4:03 PM     Status: Signed       Expand All Collapse All   Per SN: (1)For the nasal congestion, use Afrin 2sprays each nostril BID. (2)Nasal saline wash 1-2 times a day. (3)Allegra 180mg  QD. (4)Pred dose pack 10mg  6day pack take as directed. (5)Next step is ENT eval.       Referral was made and nothing further needed per pt

## 2016-04-29 NOTE — Progress Notes (Signed)
Andrew [name] 75 y.o. male Pulmonary Rehab Orientation Note Patient arrived today in Cardiac and Pulmonary Rehab for orientation to Pulmonary Rehab. He was transported from [employer] via wheel chair. He does carry portable oxygen. Per pt, he uses oxygen continuously. Color good, skin warm and dry. Patient is oriented to time and place. Patient's medical history, psychosocial health, and medications reviewed. Psychosocial assessment reveals pt lives with their daughter, his son-in-law, and 3 grandchildren. He moved from [place] 9 months ago to live with family. Pt is currently retired since 2006 and actually worked for [employer] in the past. Pt hobbies include vintage cars. He has a [employer] and participates in cruise in's.  He states he has no stress.   Pt does not exhibit signs of depression.  PHQ2/9 score 0/0. Pt shows good  coping skills with positive outlook .  Will continue to monitor and evaluate progress toward psychosocial goal(s) of increased strength and stamina . Physical assessment reveals heart rate is normal, breath sounds clear to auscultation, no wheezes, rales, or rhonchi. Grip strength equal, strong. Distal pulses 2+ posterior tibial pulses present. Patient reports he does take medications as prescribed. Patient states he follows a Regular diet. The patient has been trying to gain weight by eating higher caloric foods and has gained 10 poonds recently.  He lost 30 pounds after 2 hospitalization with pneumonia.  .. Patient's weight will be monitored closely. Demonstration and practice of PLB using pulse oximeter. Patient able to return demonstration satisfactorily. Safety and hand hygiene in the exercise area reviewed with patient. Patient voices understanding of the information reviewed. Department expectations discussed with patient and achievable goals were set. The patient shows enthusiasm about attending the program and we look forward to working with this nice gentleman. The patient  is scheduled for a 6 min walk test on Thursday, May 02, 2016 @ 3:30 pm and to begin exercise on Thursday, May 09, 2016 in the 1030 class.   [id number]

## 2016-05-02 ENCOUNTER — Encounter (HOSPITAL_COMMUNITY)
Admission: RE | Admit: 2016-05-02 | Discharge: 2016-05-02 | Disposition: A | Discharge status: Home / Self Care | Payer: Medicare Other | Source: Ambulatory Visit | Attending: Family Medicine | Admitting: Family Medicine

## 2016-05-02 DIAGNOSIS — I272 Other secondary pulmonary hypertension: Secondary | ICD-10-CM | POA: Diagnosis not present

## 2016-05-02 DIAGNOSIS — J439 Emphysema, unspecified: Secondary | ICD-10-CM | POA: Diagnosis not present

## 2016-05-02 DIAGNOSIS — Z79899 Other long term (current) drug therapy: Secondary | ICD-10-CM | POA: Diagnosis not present

## 2016-05-02 DIAGNOSIS — Z87891 Personal history of nicotine dependence: Secondary | ICD-10-CM | POA: Diagnosis not present

## 2016-05-03 ENCOUNTER — Telehealth: Payer: Self-pay | Admitting: Pulmonary Disease

## 2016-05-03 NOTE — Telephone Encounter (Signed)
I called pt & discussed what I knew about the [facility] and their stem cell treatment for lung disease.., he told me their treatments cost $8,500-$12,000 and the web site indicates that all claims of efficacy are anecdotal... I mentioned to [facility] that if he is truly interested in experimental/ cutting-edge pulnmonary research/ trials/ etc that he would be better served going to [facility] or [facility] or [facility] for a pulmonary second opinion & review of his situation... He will let me know

## 2016-05-03 NOTE — Progress Notes (Signed)
Pulmonary Individual Treatment Plan  Patient Details  Name: Andrew [name] MRN: [id number] Date of Birth: October 22, 1941 Referring Provider:        Pulmonary Rehab Walk Test from 05/02/2016 in [place]   Referring Provider  Dr. Dorthy Cooler      Initial Encounter Date:       Pulmonary Rehab Walk Test from 05/02/2016 in [place]   Date  05/02/16   Referring Provider  Dr. Dorthy Cooler      Visit Diagnosis: No diagnosis found.  Patient's Home Medications on Admission:   Current outpatient prescriptions:  .  ADVAIR DISKUS 250-50 MCG/DOSE AEPB, Inhale 1 puff into the lungs 2 (two) times daily., Disp: 60 each, Rfl: 2 .  albuterol (PROVENTIL HFA;VENTOLIN HFA) 108 (90 Base) MCG/ACT inhaler, Inhale 2 puffs into the lungs every 6 (six) hours as needed for wheezing or shortness of breath. (Patient taking differently: Inhale 2 puffs into the lungs 3 (three) times daily. And q 6 hr PRN), Disp: 1 Inhaler, Rfl: 2 .  Ascorbic Acid (VITAMIN C PO), Take 500 mg by mouth daily. , Disp: , Rfl:  .  CALCIUM PO, Take 500 mg by mouth daily. , Disp: , Rfl:  .  diltiazem (TIAZAC) 240 MG 24 hr capsule, Take 240 mg by mouth daily., Disp: , Rfl:  .  esomeprazole (NEXIUM) 20 MG capsule, Take 20 mg by mouth 2 (two) times daily before a meal. , Disp: , Rfl:  .  finasteride (PROSCAR) 5 MG tablet, Take 5 mg by mouth daily. , Disp: , Rfl:  .  ipratropium-albuterol (DUONEB) 0.5-2.5 (3) MG/3ML SOLN, Take 3 mLs by nebulization every 6 (six) hours as needed. Dx: J44.9, Disp: 360 mL, Rfl: 12 .  methylPREDNISolone (MEDROL DOSEPAK) 4 MG TBPK tablet, Take as directed, Disp: 21 tablet, Rfl: 0 .  Multiple Vitamins-Minerals (MULTIVITAMIN WITH MINERALS) tablet, Take 1 tablet by mouth daily., Disp: , Rfl:  .  polyethylene glycol (MIRALAX / GLYCOLAX) packet, Take 17 g by mouth at bedtime., Disp: , Rfl:  .  predniSONE (STERAPRED UNI-PAK 21 TAB) 10 MG (21) TBPK tablet, Take as  directed - 6 day pack (Patient not taking: Reported on 04/29/2016), Disp: 21 tablet, Rfl: 0 .  rosuvastatin (CRESTOR) 10 MG tablet, Take 10 mg by mouth daily., Disp: , Rfl:  .  sildenafil (REVATIO) 20 MG tablet, Take 20 mg by mouth 2 (two) times daily. , Disp: , Rfl:  .  SPIRIVA HANDIHALER 18 MCG inhalation capsule, INHALE THE CONTENTS OF 1 CAPSULE EVERY DAY, Disp: 90 capsule, Rfl: 2 .  tamsulosin (FLOMAX) 0.4 MG CAPS capsule, Take 1 capsule (0.4 mg total) by mouth daily after breakfast., Disp: 30 capsule, Rfl: 0 .  temazepam (RESTORIL) 30 MG capsule, Take 30 mg by mouth at bedtime., Disp: , Rfl:  .  vitamin E 1000 UNIT capsule, Take 1,000 Units by mouth daily., Disp: , Rfl:   Past Medical History: Past Medical History  Diagnosis Date  . 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 Hypertension   . Pulmonary hypertension ([facility])     Tobacco Use: History  Smoking status  . Former Smoker -- 0.00 packs/day for 0 years  . Types: Cigarettes  . Quit date: 10/01/1984  Smokeless tobacco  . Not on file    Labs: Recent Review Flowsheet Data    Labs for ITP Cardiac and Pulmonary Rehab Latest Ref Rng 01/02/2016 01/10/2016   Hemoglobin A1c 4.8 - 5.6 % 6.2(H) -  TCO2 0 - 100 mmol/L - 26      Capillary Blood Glucose: No results found for: GLUCAP   ADL UCSD:   Pulmonary Function Assessment:   Exercise Target Goals: Date: 05/02/16  Exercise Program Goal: Individual exercise prescription set with THRR, safety & activity barriers. Participant demonstrates ability to understand and report RPE using BORG scale, to self-measure pulse accurately, and to acknowledge the importance of the exercise prescription.  Exercise Prescription Goal: Starting with aerobic activity 30 plus minutes a day, 3 days per week for initial exercise prescription. Provide home exercise prescription and guidelines that participant acknowledges understanding prior to discharge.  Activity Barriers & Risk Stratification:     Activity  Barriers & Cardiac Risk Stratification - 04/29/16 1233    Activity Barriers & Cardiac Risk Stratification   Activity Barriers Shortness of Breath;Muscular Weakness;Deconditioning      6 Minute Walk:     6 Minute Walk      05/02/16 1704       6 Minute Walk   Phase Initial     Distance 900 feet     Walk Time 5.45 minutes     # of Rest Breaks 1     MPH 1.77     METS 2.77     RPE 13     Perceived Dyspnea  2     VO2 Peak 9.72     Symptoms No     Resting HR 111 bpm     Resting BP 130/60 mmHg     Max Ex. HR 126 bpm     Max Ex. BP 160/70 mmHg     2 Minute Post BP 160/70 mmHg     Interval HR   Baseline HR 111     1 Minute HR 119     2 Minute HR 122     3 Minute HR 126     4 Minute HR 123     5 Minute HR 120     6 Minute HR 118     2 Minute Post HR 116     Interval Heart Rate? Yes     Interval Oxygen   Interval Oxygen? Yes     Baseline Oxygen Saturation % 94 %     Baseline Liters of Oxygen 2 L     1 Minute Oxygen Saturation % 119 %     1 Minute Liters of Oxygen 2 L     2 Minute Oxygen Saturation % 122 %     2 Minute Liters of Oxygen 2 L     3 Minute Oxygen Saturation % 126 %     3 Minute Liters of Oxygen 2 L     4 Minute Oxygen Saturation % 123 %     4 Minute Liters of Oxygen 2 L     5 Minute Oxygen Saturation % 120 %     5 Minute Liters of Oxygen 2 L     6 Minute Oxygen Saturation % 118 %     6 Minute Liters of Oxygen 2 L     2 Minute Post Oxygen Saturation % 116 %     2 Minute Post Liters of Oxygen 2 L        Initial Exercise Prescription:     Initial Exercise Prescription - 05/03/16 1600    Date of Initial Exercise RX and Referring Provider   Date 05/02/16   Referring Provider Dr. Dorthy Cooler   Oxygen   Oxygen Continuous  Liters 2   Bike   Level 0.3   Minutes 15   NuStep   Level 2   Minutes 15   METs 1.7   Track   Laps 7   Minutes 15   Prescription Details   Frequency (times per week) 2   Duration Progress to 45 minutes of aerobic exercise  without signs/symptoms of physical distress   Intensity   THRR 40-80% of Max Heartrate 58-115   Ratings of Perceived Exertion 11-13   Perceived Dyspnea 0-4   Progression   Progression Continue progressive overload as per policy without signs/symptoms or physical distress.   Resistance Training   Training Prescription Yes   Weight orange bands   Reps 10-12      Perform Capillary Blood Glucose checks as needed.  Exercise Prescription Changes:   Exercise Comments:   Discharge Exercise Prescription (Final Exercise Prescription Changes):    Nutrition:  Target Goals: Understanding of nutrition guidelines, daily intake of sodium 1500mg , cholesterol 200mg , calories 30% from fat and 7% or less from saturated fats, daily to have 5 or more servings of fruits and vegetables.  Biometrics:     Pre Biometrics - 04/29/16 1235    Pre Biometrics   Grip Strength 28 kg       Nutrition Therapy Plan and Nutrition Goals:   Nutrition Discharge: Rate Your Plate Scores:   Psychosocial: Target Goals: Acknowledge presence or absence of depression, maximize coping skills, provide positive support system. Participant is able to verbalize types and ability to use techniques and skills needed for reducing stress and depression.  Initial Review & Psychosocial Screening:     Initial Psych Review & Screening - 04/29/16 1243    Initial Review   Current issues with --  none identified   Family Dynamics   Good Support System? Yes   Barriers   Psychosocial barriers to participate in program There are no identifiable barriers or psychosocial needs.   Screening Interventions   Interventions Encouraged to exercise      Quality of Life Scores:   PHQ-9:     Recent Review Flowsheet Data    Depression screen [facility] 2/9 04/29/2016   Decreased Interest 0   Down, Depressed, Hopeless 0   PHQ - 2 Score 0      Psychosocial Evaluation and Intervention:     Psychosocial Evaluation - 04/29/16  1247    Psychosocial Evaluation & Interventions   Interventions Encouraged to exercise with the program and follow exercise prescription   Comments none identified   Continued Psychosocial Services Needed No      Psychosocial Re-Evaluation:  Education: Education Goals: Education classes will be provided on a weekly basis, covering required topics. Participant will state understanding/return demonstration of topics presented.  Learning Barriers/Preferences:     Learning Barriers/Preferences - 04/29/16 1233    Learning Barriers/Preferences   Learning Barriers None   Learning Preferences Skilled Demonstration;Group Instruction      Education Topics: Risk Factor Reduction:  -Group instruction that is supported by a PowerPoint presentation. Instructor discusses the definition of a risk factor, different risk factors for pulmonary disease, and how the heart and lungs work together.     Nutrition for Pulmonary Patient:  -Group instruction provided by PowerPoint slides, verbal discussion, and written materials to support subject matter. The instructor gives an explanation and review of healthy diet recommendations, which includes a discussion on weight management, recommendations for fruit and vegetable consumption, as well as protein, fluid, caffeine, fiber, sodium, sugar, and  alcohol. Tips for eating when patients are short of breath are discussed.   Pursed Lip Breathing:  -Group instruction that is supported by demonstration and informational handouts. Instructor discusses the benefits of pursed lip and diaphragmatic breathing and detailed demonstration on how to preform both.     Oxygen Safety:  -Group instruction provided by PowerPoint, verbal discussion, and written material to support subject matter. There is an overview of "What is Oxygen" and "Why do we need it".  Instructor also reviews how to create a safe environment for oxygen use, the importance of using oxygen as  prescribed, and the risks of noncompliance. There is a brief discussion on traveling with oxygen and resources the patient may utilize.   Oxygen Equipment:  -Group instruction provided by [facility] Staff utilizing handouts, written materials, and equipment demonstrations.   Signs and Symptoms:  -Group instruction provided by written material and verbal discussion to support subject matter. Warning signs and symptoms of infection, stroke, and heart attack are reviewed and when to call the physician/911 reinforced. Tips for preventing the spread of infection discussed.   Advanced Directives:  -Group instruction provided by verbal instruction and written material to support subject matter. Instructor reviews Advanced Directive laws and proper instruction for filling out document.   Pulmonary Video:  -Group video education that reviews the importance of medication and oxygen compliance, exercise, good nutrition, pulmonary hygiene, and pursed lip and diaphragmatic breathing for the pulmonary patient.   Exercise for the Pulmonary Patient:  -Group instruction that is supported by a PowerPoint presentation. Instructor discusses benefits of exercise, core components of exercise, frequency, duration, and intensity of an exercise routine, importance of utilizing pulse oximetry during exercise, safety while exercising, and options of places to exercise outside of rehab.     Pulmonary Medications:  -Verbally interactive group education provided by instructor with focus on inhaled medications and proper administration.   Anatomy and Physiology of the Respiratory System and Intimacy:  -Group instruction provided by PowerPoint, verbal discussion, and written material to support subject matter. Instructor reviews respiratory cycle and anatomical components of the respiratory system and their functions. Instructor also reviews differences in obstructive and restrictive respiratory diseases with examples  of each. Intimacy, Sex, and Sexuality differences are reviewed with a discussion on how relationships can change when diagnosed with pulmonary disease. Common sexual concerns are reviewed.   Knowledge Questionnaire Score:   Core Components/Risk Factors/Patient Goals at Admission:     Personal Goals and Risk Factors at Admission - 04/29/16 1239    Core Components/Risk Factors/Patient Goals on Admission   Increase Strength and Stamina Yes   Intervention Provide advice, education, support and counseling about physical activity/exercise needs.;Develop an individualized exercise prescription for aerobic and resistive training based on initial evaluation findings, risk stratification, comorbidities and participant's personal goals.   Expected Outcomes Achievement of increased cardiorespiratory fitness and enhanced flexibility, muscular endurance and strength shown through measurements of functional capacity and personal statement of participant.   Improve shortness of breath with ADL's Yes   Intervention Provide education, individualized exercise plan and daily activity instruction to help decrease symptoms of SOB with activities of daily living.   Expected Outcomes Short Term: Achieves a reduction of symptoms when performing activities of daily living.   Develop more efficient breathing techniques such as purse lipped breathing and diaphragmatic breathing; and practicing self-pacing with activity Yes   Intervention Provide education, demonstration and support about specific breathing techniuqes utilized for more efficient breathing. Include techniques such as pursed  lipped breathing, diaphragmatic breathing and self-pacing activity.   Expected Outcomes Short Term: Participant will be able to demonstrate and use breathing techniques as needed throughout daily activities.      Core Components/Risk Factors/Patient Goals Review:      Goals and Risk Factor Review      04/29/16 1242            Core Components/Risk Factors/Patient Goals Review   Personal Goals Review Increase Strength and Stamina;Develop more efficient breathing techniques such as purse lipped breathing and diaphragmatic breathing and practicing self-pacing with activity.;Improve shortness of breath with ADL's       Review Increase in stamina through aerobic exercise       Expected Outcomes Improved strength and stamina          Core Components/Risk Factors/Patient Goals at Discharge (Final Review):      Goals and Risk Factor Review - 04/29/16 1242    Core Components/Risk Factors/Patient Goals Review   Personal Goals Review Increase Strength and Stamina;Develop more efficient breathing techniques such as purse lipped breathing and diaphragmatic breathing and practicing self-pacing with activity.;Improve shortness of breath with ADL's   Review Increase in stamina through aerobic exercise   Expected Outcomes Improved strength and stamina      ITP Comments:   Comments:

## 2016-05-03 NOTE — Telephone Encounter (Signed)
Patient received email from [place] that does reginerative lung tissue and he is wanting to see if Dr. Lenna Gilford has any knowledge of this and what his input is.  Patient requesting call from Dr. Lenna Gilford to discuss.  Dr. Lenna Gilford, patient can be reached at [phone]

## 2016-05-06 DIAGNOSIS — N401 Enlarged prostate with lower urinary tract symptoms: Secondary | ICD-10-CM | POA: Diagnosis not present

## 2016-05-06 DIAGNOSIS — R3912 Poor urinary stream: Secondary | ICD-10-CM | POA: Diagnosis not present

## 2016-05-06 DIAGNOSIS — R3121 Asymptomatic microscopic hematuria: Secondary | ICD-10-CM | POA: Diagnosis not present

## 2016-05-08 ENCOUNTER — Emergency Department (HOSPITAL_COMMUNITY): Payer: Medicare Other

## 2016-05-08 ENCOUNTER — Emergency Department (HOSPITAL_COMMUNITY)
Admission: EM | Admit: 2016-05-08 | Discharge: 2016-05-08 | Disposition: A | Discharge status: Home / Self Care | Payer: Medicare Other | Attending: Emergency Medicine | Admitting: Emergency Medicine

## 2016-05-08 ENCOUNTER — Encounter (HOSPITAL_COMMUNITY): Payer: Self-pay | Admitting: Emergency Medicine

## 2016-05-08 DIAGNOSIS — K6389 Other specified diseases of intestine: Secondary | ICD-10-CM | POA: Diagnosis not present

## 2016-05-08 DIAGNOSIS — Z87891 Personal history of nicotine dependence: Secondary | ICD-10-CM | POA: Insufficient documentation

## 2016-05-08 DIAGNOSIS — R509 Fever, unspecified: Secondary | ICD-10-CM | POA: Insufficient documentation

## 2016-05-08 DIAGNOSIS — K5900 Constipation, unspecified: Secondary | ICD-10-CM | POA: Insufficient documentation

## 2016-05-08 DIAGNOSIS — Z79899 Other long term (current) drug therapy: Secondary | ICD-10-CM | POA: Diagnosis not present

## 2016-05-08 DIAGNOSIS — I10 Essential (primary) hypertension: Secondary | ICD-10-CM | POA: Diagnosis not present

## 2016-05-08 DIAGNOSIS — R1084 Generalized abdominal pain: Secondary | ICD-10-CM | POA: Diagnosis present

## 2016-05-08 DIAGNOSIS — J439 Emphysema, unspecified: Secondary | ICD-10-CM | POA: Diagnosis not present

## 2016-05-08 DIAGNOSIS — N3289 Other specified disorders of bladder: Secondary | ICD-10-CM | POA: Diagnosis not present

## 2016-05-08 LAB — COMPREHENSIVE METABOLIC PANEL
ALT: 17 U/L (ref 17–63)
ANION GAP: 6 (ref 5–15)
AST: 13 U/L — ABNORMAL LOW (ref 15–41)
Albumin: 3.6 g/dL (ref 3.5–5.0)
Alkaline Phosphatase: 57 U/L (ref 38–126)
BILIRUBIN TOTAL: 0.8 mg/dL (ref 0.3–1.2)
BUN: 15 mg/dL (ref 6–20)
CALCIUM: 8.6 mg/dL — AB (ref 8.9–10.3)
CHLORIDE: 99 mmol/L — AB (ref 101–111)
CO2: 27 mmol/L (ref 22–32)
Creatinine, Ser: 0.85 mg/dL (ref 0.61–1.24)
GLUCOSE: 149 mg/dL — AB (ref 65–99)
POTASSIUM: 4 mmol/L (ref 3.5–5.1)
Sodium: 132 mmol/L — ABNORMAL LOW (ref 135–145)
Total Protein: 6.1 g/dL — ABNORMAL LOW (ref 6.5–8.1)

## 2016-05-08 LAB — LIPASE, BLOOD: LIPASE: 17 U/L (ref 11–51)

## 2016-05-08 LAB — CBC
HCT: 34.9 % — ABNORMAL LOW (ref 39.0–52.0)
HEMOGLOBIN: 11.7 g/dL — AB (ref 13.0–17.0)
MCH: 29.3 pg (ref 26.0–34.0)
MCHC: 33.5 g/dL (ref 30.0–36.0)
MCV: 87.5 fL (ref 78.0–100.0)
Platelets: 268 10*3/uL (ref 150–400)
RBC: 3.99 MIL/uL — AB (ref 4.22–5.81)
RDW: 14.9 % (ref 11.5–15.5)
WBC: 15 10*3/uL — ABNORMAL HIGH (ref 4.0–10.5)

## 2016-05-08 LAB — URINALYSIS, ROUTINE W REFLEX MICROSCOPIC
Bilirubin Urine: NEGATIVE
Glucose, UA: NEGATIVE mg/dL
Hgb urine dipstick: NEGATIVE
Ketones, ur: NEGATIVE mg/dL
LEUKOCYTES UA: NEGATIVE
NITRITE: NEGATIVE
Protein, ur: NEGATIVE mg/dL
SPECIFIC GRAVITY, URINE: 1.011 (ref 1.005–1.030)
pH: 6.5 (ref 5.0–8.0)

## 2016-05-08 MED ORDER — IOPAMIDOL (ISOVUE-300) INJECTION 61%
100.0000 mL | Freq: Once | INTRAVENOUS | Status: AC | PRN
  Administered 2016-05-08: 100 mL via INTRAVENOUS

## 2016-05-08 MED ORDER — FLEET ENEMA 7-19 GM/118ML RE ENEM: 1.0000 | ENEMA | Freq: Every day | RECTAL | Status: DC | PRN

## 2016-05-08 MED ORDER — POLYETHYLENE GLYCOL 3350 17 G PO PACK: 17.0000 g | PACK | Freq: Every day | ORAL | Status: AC

## 2016-05-08 NOTE — ED Notes (Signed)
Patient transported to X-ray 

## 2016-05-08 NOTE — ED Provider Notes (Signed)
CSN: [id number]     Arrival date & time 05/08/16  1349 History   First MD Initiated Contact with Patient 05/08/16 1606     Chief Complaint  Patient presents with  . Abdominal Pain     (Consider location/radiation/quality/duration/timing/severity/associated sxs/prior Treatment) Patient is a 75 y.o. male presenting with abdominal pain.  Abdominal Pain Pain location:  Generalized Pain quality: aching, bloating and pressure   Pain radiates to:  Does not radiate Pain severity:  Mild Onset quality:  Gradual Duration:  3 days Timing:  Constant Progression:  Worsening Chronicity:  New Context: not alcohol use and not sick contacts   Associated symptoms: fatigue and fever   Associated symptoms: no chest pain, no chills, no cough and no shortness of breath     Past Medical History  Diagnosis Date  . Emphysema lung ([place])   . Hypertension   . Pulmonary hypertension [facility])    Past Surgical History  Procedure Laterality Date  . Tonsillectomy    . Lung surgery     Family History  Problem Relation Age of Onset  . Bone cancer Maternal Uncle    Social History  Substance Use Topics  . Smoking status: Former Smoker -- 0.00 packs/day for 0 years    Types: Cigarettes    Quit date: 10/01/1984  . Smokeless tobacco: None  . Alcohol Use: No    Review of Systems  Constitutional: Positive for fever, appetite change, fatigue and unexpected weight change. Negative for chills.  Eyes: Negative for pain.  Respiratory: Negative for cough and shortness of breath.   Cardiovascular: Negative for chest pain.  Gastrointestinal: Positive for abdominal pain.  Endocrine: Negative for polyuria.  Genitourinary: Negative for enuresis and genital sores.  All other systems reviewed and are negative.     Allergies  Review of patient's allergies indicates no known allergies.  Home Medications   Prior to Admission medications   Medication Sig Start Date End Date Taking? Authorizing Provider   ADVAIR DISKUS 250-50 MCG/DOSE AEPB Inhale 1 puff into the lungs 2 (two) times daily. 11/01/15  Yes Noralee Space, MD  albuterol (PROVENTIL HFA;VENTOLIN HFA) 108 (90 Base) MCG/ACT inhaler Inhale 2 puffs into the lungs every 6 (six) hours as needed for wheezing or shortness of breath. 01/03/16  Yes Nita Sells, MD  Ascorbic Acid (VITAMIN C PO) Take 500 mg by mouth daily.    Yes Historical Provider, MD  CALCIUM PO Take 500 mg by mouth daily.    Yes Historical Provider, MD  diltiazem (TIAZAC) 240 MG 24 hr capsule Take 240 mg by mouth daily.   Yes Historical Provider, MD  esomeprazole (NEXIUM) 20 MG capsule Take 20 mg by mouth 2 (two) times daily before a meal.    Yes Historical Provider, MD  finasteride (PROSCAR) 5 MG tablet Take 5 mg by mouth daily.    Yes Historical Provider, MD  ipratropium-albuterol (DUONEB) 0.5-2.5 (3) MG/3ML SOLN Take 3 mLs by nebulization every 6 (six) hours as needed. Dx: J44.9 Patient taking differently: Take 3 mLs by nebulization 3 (three) times daily. Dx: J44.9 03/18/16  Yes Noralee Space, MD  Multiple Vitamins-Minerals (MULTIVITAMIN WITH MINERALS) tablet Take 1 tablet by mouth daily.   Yes Historical Provider, MD  rosuvastatin (CRESTOR) 10 MG tablet Take 10 mg by mouth daily.   Yes Historical Provider, MD  sildenafil (REVATIO) 20 MG tablet Take 20 mg by mouth 2 (two) times daily.    Yes Historical Provider, MD  SPIRIVA HANDIHALER 18 MCG inhalation  capsule INHALE THE CONTENTS OF 1 CAPSULE EVERY DAY 04/23/16  Yes Noralee Space, MD  tamsulosin [facility]) 0.4 MG CAPS capsule Take 1 capsule (0.4 mg total) by mouth daily after breakfast. 01/03/16  Yes Nita Sells, MD  temazepam (RESTORIL) 30 MG capsule Take 30 mg by mouth at bedtime.   Yes Historical Provider, MD  vitamin E 1000 UNIT capsule Take 1,000 Units by mouth daily.   Yes Historical Provider, MD  polyethylene glycol (MIRALAX / GLYCOLAX) packet Take 17 g by mouth daily. 05/08/16   Merrily Pew, MD  sodium phosphate  (FLEET) 7-19 GM/118ML ENEM Place 133 mLs (1 enema total) rectally daily as needed for moderate constipation or severe constipation. 05/08/16   Corene Cornea Tarini Carrier, MD   BP 105/65 mmHg  Pulse 76  Temp(Src) 98.8 F (37.1 C) (Oral)  Resp 17  SpO2 100% Physical Exam  Constitutional: He is oriented to person, place, and time. He appears well-developed and well-nourished.  HENT:  Head: Normocephalic and atraumatic.  Eyes: Pupils are equal, round, and reactive to light.  Neck: Normal range of motion.  Cardiovascular: Normal rate.   Pulmonary/Chest: Effort normal and breath sounds normal. No respiratory distress. He has no rales.  Abdominal: Soft. He exhibits no distension. There is no rebound.  Musculoskeletal: Normal range of motion. He exhibits no edema or tenderness.  Neurological: He is alert and oriented to person, place, and time.  Skin: Skin is warm and dry. No erythema.  Nursing note and vitals reviewed.   ED Course  Procedures (including critical care time) Labs Review Labs Reviewed  COMPREHENSIVE METABOLIC PANEL - Abnormal; Notable for the following:    Sodium 132 (*)    Chloride 99 (*)    Glucose, Bld 149 (*)    Calcium 8.6 (*)    Total Protein 6.1 (*)    AST 13 (*)    All other components within normal limits  CBC - Abnormal; Notable for the following:    WBC 15.0 (*)    RBC 3.99 (*)    Hemoglobin 11.7 (*)    HCT 34.9 (*)    All other components within normal limits  LIPASE, BLOOD  URINALYSIS, ROUTINE W REFLEX MICROSCOPIC (NOT AT [facility])    Imaging Review Dg Chest 2 View  05/08/2016  CLINICAL DATA:  Abdominal pain and distension 3 days. Fever. Two previous right-sided lobectomies. EXAM: CHEST  2 VIEW COMPARISON:  02/12/2016 and 02/11/2016 as well as 10/02/2015 FINDINGS: Lungs are adequately inflated demonstrate moderate bullous emphysematous disease with mild chronic changes in the lung bases. Surgical suture lines over the right mid and lower lung. Minimal blunting of the  right costophrenic angle which may represent pleural parenchymal scarring versus a small amount right pleural fluid. Cardiomediastinal silhouette is within normal. There is very minimal calcified plaque over the thoracic aorta. Remainder of the exam is unchanged. IMPRESSION: No acute cardiopulmonary disease. Moderate emphysematous disease with postsurgical changes of the right lung. Chronic bibasilar changes. Possible small amount right pleural fluid versus pleural parenchymal scarring. Aortic atherosclerosis. Electronically Signed   By: Marin Olp M.D.   On: 05/08/2016 17:37   Ct Abdomen Pelvis W Contrast  05/08/2016  CLINICAL DATA:  Abdominal pain and distention for 2 days. Low grade fever. On home oxygen. EXAM: CT ABDOMEN AND PELVIS WITH CONTRAST TECHNIQUE: Multidetector CT imaging of the abdomen and pelvis was performed using the standard protocol following bolus administration of intravenous contrast. CONTRAST:  142mL ISOVUE-300 IOPAMIDOL (ISOVUE-300) INJECTION 61% COMPARISON:  Abdominal  radiographs 12/30/2015. FINDINGS: Lower chest: Severe emphysematous changes are present at both lung bases. There is superimposed scarring on the right. No significant pleural or pericardial effusion. Hepatobiliary: 7 mm hypervascular lesion in the right hepatic lobe on image 14 is nonspecific, although likely a small hemangioma or other incidental vascular finding. There is a tiny cyst inferiorly in the right hepatic lobe, best seen on coronal image 43. No evidence of gallstones, gallbladder wall thickening or biliary dilatation. Pancreas: Unremarkable. No pancreatic ductal dilatation or surrounding inflammatory changes. Spleen: Normal in size with scattered calcified granulomas. No suspicious findings. Adrenals/Urinary Tract: Both adrenal glands appear normal. The kidneys appear normal without evidence of urinary tract calculus, suspicious lesion or hydronephrosis. The bladder is moderately distended without focal  abnormality. Stomach/Bowel: No evidence of bowel wall thickening, distention or surrounding inflammatory change. There is a large amount of stool throughout the colon. The appendix appears normal. Vascular/Lymphatic: There are no enlarged abdominal or pelvic lymph nodes. There is aortic and branch vessel atherosclerosis. No evidence of large vessel occlusion. Reproductive: The prostate gland and seminal vesicles appear unremarkable. Other: No evidence of abdominal wall hernia or ascites. Musculoskeletal: There are bilateral L5 pars defects with a minimal anterolisthesis and mild biforaminal narrowing at L5-S1. Mild lumbar spine degenerative changes are present. IMPRESSION: 1. The colon is distended with a large amount of stool. There is no evidence of bowel wall thickening, obstruction or surrounding inflammation. The appendix appears normal. 2. No ascites. 3. Distended bladder without hydronephrosis. 4. Aortic atherosclerosis. 5. Probable incidental hypervascular liver lesion. 6. Bilateral L5 pars defects. Electronically Signed   By: Richardean Sale M.D.   On: 05/08/2016 17:52   I have personally reviewed and evaluated these images and lab results as part of my medical decision-making.   EKG Interpretation None      MDM   Final diagnoses:  Constipation, unspecified constipation type   Need evaluation for obstruction v free air v ascites. Plan for ct scan.  CT without any evidence for infection. This has marked constipation. No evidence of free air either. Just has significant distention secondary to the gas. Patient will try MiraLAX and enemas at home to relieve his constipation. If he is not improving in 2 days to return here for further evaluation.  New Prescriptions: New Prescriptions   POLYETHYLENE GLYCOL (MIRALAX / GLYCOLAX) PACKET    Take 17 g by mouth daily.   SODIUM PHOSPHATE (FLEET) 7-19 GM/118ML ENEM    Place 133 mLs (1 enema total) rectally daily as needed for moderate constipation or  severe constipation.     I have personally and contemperaneously reviewed labs and imaging and used in my decision making as above.   A medical screening exam was performed and I feel the patient has had an appropriate workup for their chief complaint at this time and likelihood of emergent condition existing is low and thus workup can continue on an outpatient basis.. Their vital signs are stable. They have been counseled on decision, discharge, follow up and which symptoms necessitate immediate return to the emergency department.  They verbally stated understanding and agreement with plan and discharged in stable condition.      Merrily Pew, MD 05/08/16 [phone]

## 2016-05-08 NOTE — ED Notes (Signed)
Made request for urine sample,pt unable to provide one at this time

## 2016-05-08 NOTE — Discharge Instructions (Signed)
MiraLAX is an osmotic laxative. This means that it will keep electrolytes and water in your stool and allow you to have easier bowel movements. You  can take this medication up to 3 times daily as needed produce bowel movements. However while you're taking this much MiraLAX you need to make sure you are replenishing her electrolytes and water loss. He should also not take it more than 2 days in a row at this level. Generally I recommend people workup to this. For example take 1 capful of MiraLAX once a day for 2-3 days and if you do not get improvement in your bowel habits then take 1 capful twice a day for 2-3 days and if they don't get relief from this and you can take 1 capful 3 times a day for 2 days. If you start having diarrhea, decrease the amount of MiraLAX you are using until you have soft formed stools once to twice a day. Remember during this process that you need to increase your electrolytes and water intake, so oftentimes sports drinks are good for this. Some people end up taking half or one whole capful daily for the rest of her lives to have regular bowel movements you can do this as you feel is proper. ° °

## 2016-05-08 NOTE — ED Notes (Signed)
Pt reports abd pain and distention since Monday night. Also reports elevated temp of 100.2F this am that decreased with ibuprofen. No nvd. Pt on home O2

## 2016-05-09 ENCOUNTER — Encounter (HOSPITAL_COMMUNITY)
Admission: RE | Admit: 2016-05-09 | Discharge: 2016-05-09 | Disposition: A | Discharge status: Home / Self Care | Payer: Medicare Other | Source: Ambulatory Visit | Attending: Family Medicine | Admitting: Family Medicine

## 2016-05-09 VITALS — Wt 131.2 lb

## 2016-05-09 DIAGNOSIS — Z79899 Other long term (current) drug therapy: Secondary | ICD-10-CM | POA: Diagnosis not present

## 2016-05-09 DIAGNOSIS — Z87891 Personal history of nicotine dependence: Secondary | ICD-10-CM | POA: Diagnosis not present

## 2016-05-09 DIAGNOSIS — J439 Emphysema, unspecified: Secondary | ICD-10-CM

## 2016-05-09 DIAGNOSIS — I272 Other secondary pulmonary hypertension: Secondary | ICD-10-CM | POA: Diagnosis not present

## 2016-05-09 NOTE — Progress Notes (Signed)
Daily Session Note  Patient Details  Name: Andrew [name] MRN: [id number] Date of Birth: 06-May-1941 Referring Provider:        Pulmonary Rehab Walk Test from 05/02/2016 in [place]   Referring Provider  Dr. Dorthy Cooler      Encounter Date: 05/09/2016  Check In:     Session Check In - 05/09/16 1050    Check-In   Location [facility]   Staff Present Su Hilt, MS, ACSM RCEP, Exercise Physiologist;Lisa Ysidro Evert, RN;Joan Leonia Reeves, RN, BSN   Supervising physician immediately available to respond to emergencies [facility] immediately available   Physician(s) Dr. Marily Memos   Medication changes reported     No   Fall or balance concerns reported    No   Warm-up and Cool-down Performed as group-led instruction   Resistance Training Performed Yes   VAD Patient? No   Pain Assessment   Currently in Pain? No/denies   Multiple Pain Sites No      Capillary Blood Glucose: Results for orders placed or performed during the hospital encounter of 05/08/16 (from the past 24 hour(s))  Lipase, blood     Status: None   Collection Time: 05/08/16  3:06 PM  Result Value Ref Range   Lipase 17 11 - 51 U/L  Comprehensive metabolic panel     Status: Abnormal   Collection Time: 05/08/16  3:06 PM  Result Value Ref Range   Sodium 132 (L) 135 - 145 mmol/L   Potassium 4.0 3.5 - 5.1 mmol/L   Chloride 99 (L) 101 - 111 mmol/L   CO2 27 22 - 32 mmol/L   Glucose, Bld 149 (H) 65 - 99 mg/dL   BUN 15 6 - 20 mg/dL   Creatinine, Ser 0.85 0.61 - 1.24 mg/dL   Calcium 8.6 (L) 8.9 - 10.3 mg/dL   Total Protein 6.1 (L) 6.5 - 8.1 g/dL   Albumin 3.6 3.5 - 5.0 g/dL   AST 13 (L) 15 - 41 U/L   ALT 17 17 - 63 U/L   Alkaline Phosphatase 57 38 - 126 U/L   Total Bilirubin 0.8 0.3 - 1.2 mg/dL   GFR calc non Af Amer >60 >60 mL/min   GFR calc Af Amer >60 >60 mL/min   Anion gap 6 5 - 15  CBC     Status: Abnormal   Collection Time: 05/08/16  3:06 PM  Result Value Ref  Range   WBC 15.0 (H) 4.0 - 10.5 K/uL   RBC 3.99 (L) 4.22 - 5.81 MIL/uL   Hemoglobin 11.7 (L) 13.0 - 17.0 g/dL   HCT 34.9 (L) 39.0 - 52.0 %   MCV 87.5 78.0 - 100.0 fL   MCH 29.3 26.0 - 34.0 pg   MCHC 33.5 30.0 - 36.0 g/dL   RDW 14.9 11.5 - 15.5 %   Platelets 268 150 - 400 K/uL  Urinalysis, Routine w reflex microscopic     Status: None   Collection Time: 05/08/16  6:30 PM  Result Value Ref Range   Color, Urine YELLOW YELLOW   APPearance CLEAR CLEAR   Specific Gravity, Urine 1.011 1.005 - 1.030   pH 6.5 5.0 - 8.0   Glucose, UA NEGATIVE NEGATIVE mg/dL   Hgb urine dipstick NEGATIVE NEGATIVE   Bilirubin Urine NEGATIVE NEGATIVE   Ketones, ur NEGATIVE NEGATIVE mg/dL   Protein, ur NEGATIVE NEGATIVE mg/dL   Nitrite NEGATIVE NEGATIVE   Leukocytes, UA NEGATIVE NEGATIVE        Exercise  Prescription Changes - 05/09/16 1200    Response to Exercise   Blood Pressure (Admit) 100/50 mmHg   Blood Pressure (Exercise) 130/60 mmHg   Blood Pressure (Exit) 104/60 mmHg   Heart Rate (Admit) 87 bpm   Heart Rate (Exercise) 103 bpm   Heart Rate (Exit) 90 bpm   Oxygen Saturation (Admit) 98 %   Oxygen Saturation (Exercise) 94 %   Oxygen Saturation (Exit) 100 %   Rating of Perceived Exertion (Exercise) 13   Perceived Dyspnea (Exercise) 2   Duration Progress to 45 minutes of aerobic exercise without signs/symptoms of physical distress   Intensity THRR unchanged   Progression   Progression Continue progressive overload as per policy without signs/symptoms or physical distress.   Resistance Training   Training Prescription Yes   Weight orange bands   Reps 10-12   Oxygen   Oxygen Continuous   Liters 2   Bike   Level 0.3   Minutes 15   Track   Laps 8   Minutes 15     Goals Met:  Exercise tolerated well No report of cardiac concerns or symptoms Strength training completed today  Goals Unmet:  Not Applicable  Comments: Service time is from 10:30am to 12:05pm    Dr. Rush Farmer is  Medical Director for Pulmonary Rehab at [facility].

## 2016-05-14 ENCOUNTER — Encounter (HOSPITAL_COMMUNITY)
Admission: RE | Admit: 2016-05-14 | Discharge: 2016-05-14 | Disposition: A | Discharge status: Home / Self Care | Payer: Medicare Other | Source: Ambulatory Visit | Attending: Family Medicine | Admitting: Family Medicine

## 2016-05-14 VITALS — Wt 132.5 lb

## 2016-05-14 DIAGNOSIS — Z87891 Personal history of nicotine dependence: Secondary | ICD-10-CM | POA: Diagnosis not present

## 2016-05-14 DIAGNOSIS — I272 Other secondary pulmonary hypertension: Secondary | ICD-10-CM | POA: Diagnosis not present

## 2016-05-14 DIAGNOSIS — Z79899 Other long term (current) drug therapy: Secondary | ICD-10-CM | POA: Diagnosis not present

## 2016-05-14 DIAGNOSIS — J439 Emphysema, unspecified: Secondary | ICD-10-CM | POA: Diagnosis not present

## 2016-05-14 NOTE — Progress Notes (Signed)
Daily Session Note  Patient Details  Name: Andrew [name] MRN: [id number] Date of Birth: 1941/09/29 Referring Provider:        Pulmonary Rehab Walk Test from 05/02/2016 in [place]   Referring Provider  Dr. Dorthy Cooler      Encounter Date: 05/14/2016  Check In:     Session Check In - 05/14/16 1035    Check-In   Location [facility]   Staff Present Rosebud Poles, RN, BSN;Lisa Ysidro Evert, RN;Portia Rollene Rotunda, RN, BSN;Ramon Dredge, RN, MHA;Dary Dilauro, MS, ACSM RCEP, Exercise Physiologist   Supervising physician immediately available to respond to emergencies [facility] immediately available   Physician(s) Dr. Marily Memos   Medication changes reported     No   Fall or balance concerns reported    No   Warm-up and Cool-down Performed as group-led instruction   Resistance Training Performed Yes   VAD Patient? No   Pain Assessment   Currently in Pain? No/denies   Multiple Pain Sites No      Capillary Blood Glucose: No results found for this or any previous visit (from the past 24 hour(s)).      Exercise Prescription Changes - 05/14/16 1200    Response to Exercise   Blood Pressure (Admit) 100/46 mmHg   Blood Pressure (Exercise) 132/68 mmHg   Blood Pressure (Exit) 102/60 mmHg   Heart Rate (Admit) 92 bpm   Heart Rate (Exercise) 111 bpm   Heart Rate (Exit) 93 bpm   Oxygen Saturation (Admit) 99 %   Oxygen Saturation (Exercise) 92 %   Oxygen Saturation (Exit) 100 %   Rating of Perceived Exertion (Exercise) 12   Perceived Dyspnea (Exercise) 1   Duration Progress to 45 minutes of aerobic exercise without signs/symptoms of physical distress   Intensity THRR unchanged   Progression   Progression Continue progressive overload as per policy without signs/symptoms or physical distress.   Resistance Training   Training Prescription Yes   Weight orange bands   Reps 10-12   Oxygen   Oxygen Continuous   Liters 2   Bike   Level  0.3   Minutes 17   NuStep   Level 1   Minutes 17   METs 1.6   Track   Laps 12   Minutes 17     Goals Met:  Exercise tolerated well No report of cardiac concerns or symptoms Strength training completed today  Goals Unmet:  Not Applicable  Comments: Service time is from 10:30am to 12:10pm    Dr. Rush Farmer is Medical Director for Pulmonary Rehab at [facility].

## 2016-05-16 ENCOUNTER — Encounter (HOSPITAL_COMMUNITY)
Admission: RE | Admit: 2016-05-16 | Discharge: 2016-05-16 | Disposition: A | Discharge status: Home / Self Care | Payer: Medicare Other | Source: Ambulatory Visit | Attending: Family Medicine | Admitting: Family Medicine

## 2016-05-16 ENCOUNTER — Encounter (HOSPITAL_COMMUNITY): Payer: Self-pay

## 2016-05-16 VITALS — Wt 135.1 lb

## 2016-05-16 DIAGNOSIS — Z87891 Personal history of nicotine dependence: Secondary | ICD-10-CM | POA: Diagnosis not present

## 2016-05-16 DIAGNOSIS — I272 Other secondary pulmonary hypertension: Secondary | ICD-10-CM | POA: Diagnosis not present

## 2016-05-16 DIAGNOSIS — J310 Chronic rhinitis: Secondary | ICD-10-CM | POA: Diagnosis not present

## 2016-05-16 DIAGNOSIS — K219 Gastro-esophageal reflux disease without esophagitis: Secondary | ICD-10-CM | POA: Diagnosis not present

## 2016-05-16 DIAGNOSIS — Z79899 Other long term (current) drug therapy: Secondary | ICD-10-CM | POA: Diagnosis not present

## 2016-05-16 DIAGNOSIS — J439 Emphysema, unspecified: Secondary | ICD-10-CM

## 2016-05-16 NOTE — Progress Notes (Signed)
Daily Session Note  Patient Details  Name: Andrew [name] MRN: [id number] Date of Birth: October 16, 1941 Referring Provider:        Pulmonary Rehab Walk Test from 05/02/2016 in [place]   Referring Provider  Dr. Dorthy Cooler      Encounter Date: 05/16/2016  Check In:     Session Check In - 05/16/16 1022    Check-In   Location [facility]   Staff Present Rosebud Poles, RN, BSN;Lisa Ysidro Evert, RN;Portia Rollene Rotunda, RN, BSN;Molly diVincenzo, MS, ACSM RCEP, Exercise Physiologist   Supervising physician immediately available to respond to emergencies [facility] immediately available   Physician(s) Dr. Waldron Labs   Medication changes reported     No   Fall or balance concerns reported    No   Warm-up and Cool-down Performed as group-led instruction   Resistance Training Performed Yes   VAD Patient? No   Pain Assessment   Currently in Pain? No/denies   Multiple Pain Sites No      Capillary Blood Glucose: No results found for this or any previous visit (from the past 24 hour(s)).      Exercise Prescription Changes - 05/16/16 1600    Exercise Review   Progression Yes   Response to Exercise   Blood Pressure (Admit) 124/60 mmHg   Blood Pressure (Exercise) 120/60 mmHg   Blood Pressure (Exit) 110/60 mmHg   Heart Rate (Admit) 80 bpm   Heart Rate (Exercise) 108 bpm   Heart Rate (Exit) 88 bpm   Oxygen Saturation (Admit) 96 %   Oxygen Saturation (Exercise) 88 %   Oxygen Saturation (Exit) 88 %   Rating of Perceived Exertion (Exercise) 13   Perceived Dyspnea (Exercise) 3   Duration Progress to 45 minutes of aerobic exercise without signs/symptoms of physical distress   Intensity THRR unchanged   Progression   Progression Continue progressive overload as per policy without signs/symptoms or physical distress.   Resistance Training   Training Prescription Yes   Weight orange bands   Reps 10-12   Interval Training   Interval Training Yes   Oxygen   Oxygen Continuous   Liters 2   Bike   Level 0.3   Minutes 17   NuStep   Level 3   Minutes 17   METs 1.9     Goals Met:  Exercise tolerated well Queuing for purse lip breathing Strength training completed today  Goals Unmet:  Not Applicable  Comments: Service time is from 1030 to 1245    Dr. Rush Farmer is Medical Director for Pulmonary Rehab at [facility].

## 2016-05-17 NOTE — Telephone Encounter (Signed)
Andrew [name] - do you know if this was ever handled? I don't see any alternative medication prescribed. Thanks!

## 2016-05-21 ENCOUNTER — Encounter (HOSPITAL_COMMUNITY): Payer: Medicare Other

## 2016-05-23 ENCOUNTER — Encounter (HOSPITAL_COMMUNITY)
Admission: RE | Admit: 2016-05-23 | Discharge: 2016-05-23 | Disposition: A | Discharge status: Home / Self Care | Payer: Medicare Other | Source: Ambulatory Visit | Attending: Family Medicine | Admitting: Family Medicine

## 2016-05-23 VITALS — Wt 130.7 lb

## 2016-05-23 DIAGNOSIS — Z79899 Other long term (current) drug therapy: Secondary | ICD-10-CM | POA: Insufficient documentation

## 2016-05-23 DIAGNOSIS — Z87891 Personal history of nicotine dependence: Secondary | ICD-10-CM | POA: Diagnosis not present

## 2016-05-23 DIAGNOSIS — J439 Emphysema, unspecified: Secondary | ICD-10-CM | POA: Diagnosis not present

## 2016-05-23 DIAGNOSIS — I272 Other secondary pulmonary hypertension: Secondary | ICD-10-CM | POA: Diagnosis not present

## 2016-05-23 NOTE — Progress Notes (Signed)
Daily Session Note  Patient Details  Name: Andrew [name] MRN: [id number] Date of Birth: 16-Jul-1941 Referring Provider:        Pulmonary Rehab Walk Test from 05/02/2016 in [place]   Referring Provider  Dr. Dorthy Cooler      Encounter Date: 05/23/2016  Check In:     Session Check In - 05/23/16 1017    Check-In   Location [facility]   Staff Present Su Hilt, MS, ACSM RCEP, Exercise Physiologist;Joan Leonia Reeves, RN, Roque Cash, RN   Supervising physician immediately available to respond to emergencies [facility] immediately available   Physician(s) Dr. Cruzita Lederer   Medication changes reported     No   Fall or balance concerns reported    No   Warm-up and Cool-down Performed as group-led instruction   Resistance Training Performed Yes   VAD Patient? No   Pain Assessment   Currently in Pain? No/denies   Multiple Pain Sites No      Capillary Blood Glucose: No results found for this or any previous visit (from the past 24 hour(s)).      Exercise Prescription Changes - 05/23/16 1200    Exercise Review   Progression Yes   Response to Exercise   Blood Pressure (Admit) 118/72 mmHg   Blood Pressure (Exercise) 138/64 mmHg   Blood Pressure (Exit) 98/60 mmHg   Heart Rate (Admit) 87 bpm   Heart Rate (Exercise) 114 bpm   Heart Rate (Exit) 89 bpm   Oxygen Saturation (Admit) 98 %   Oxygen Saturation (Exercise) 89 %   Oxygen Saturation (Exit) 95 %   Rating of Perceived Exertion (Exercise) 13   Perceived Dyspnea (Exercise) 2   Duration Progress to 45 minutes of aerobic exercise without signs/symptoms of physical distress   Intensity THRR unchanged   Progression   Progression Continue to progress workloads to maintain intensity without signs/symptoms of physical distress.   Resistance Training   Training Prescription Yes   Weight orange bands   Reps 10-12   Interval Training   Interval Training No   Oxygen   Oxygen  Continuous   Liters 2   Bike   Level 0.4   Minutes 17   NuStep   Level 4   Minutes 17   METs 2.4     Goals Met:  Exercise tolerated well No report of cardiac concerns or symptoms Strength training completed today  Goals Unmet:  Not Applicable  Comments: Service time is from 1030 to 1215    Dr. Rush Farmer is Medical Director for Pulmonary Rehab at [facility].

## 2016-05-27 NOTE — Telephone Encounter (Signed)
Dr Lenna Gilford, please advise on an alternative for Ipratropium-Albuterol neb soln.  Thanks.

## 2016-05-28 ENCOUNTER — Encounter (HOSPITAL_COMMUNITY)
Admission: RE | Admit: 2016-05-28 | Discharge: 2016-05-28 | Disposition: A | Discharge status: Home / Self Care | Payer: Medicare Other | Source: Ambulatory Visit | Attending: Family Medicine | Admitting: Family Medicine

## 2016-05-28 VITALS — Wt 133.2 lb

## 2016-05-28 DIAGNOSIS — Z79899 Other long term (current) drug therapy: Secondary | ICD-10-CM | POA: Diagnosis not present

## 2016-05-28 DIAGNOSIS — J439 Emphysema, unspecified: Secondary | ICD-10-CM | POA: Diagnosis not present

## 2016-05-28 DIAGNOSIS — Z87891 Personal history of nicotine dependence: Secondary | ICD-10-CM | POA: Diagnosis not present

## 2016-05-28 DIAGNOSIS — I272 Other secondary pulmonary hypertension: Secondary | ICD-10-CM | POA: Diagnosis not present

## 2016-05-28 NOTE — Telephone Encounter (Signed)
Per SN: submit separate Albuterol neb TID and Ipratropium neb TID #90 vials of each.  [facility] and spoke with pharmacist Clarise Cruz.  Verbal Rx's given for the above medications w/ dx of J43.9 and run through insurance while I was on the phone with her.  Clarise Cruz then stated that pt's Duoneb is actually covered by his insurance at $3 per month.  Clarise Cruz is aware to keep Rx as is.  Nothing further needed; will sign off.

## 2016-05-28 NOTE — Progress Notes (Signed)
Daily Session Note  Patient Details  Name: Andrew [name] MRN: [id number] Date of Birth: Sep 19, 1941 Referring Provider:        Pulmonary Rehab Walk Test from 05/02/2016 in [place]   Referring Provider  Dr. Dorthy Cooler      Encounter Date: 05/28/2016  Check In:     Session Check In - 05/28/16 1025    Check-In   Location [facility]   Staff Present Rosebud Poles, RN, BSN;Lisa Ysidro Evert, RN;Portia Rollene Rotunda, RN, BSN;Ramon Dredge, RN, [facility]   Supervising physician immediately available to respond to emergencies [facility] immediately available   Physician(s) Dr. Marily Memos   Medication changes reported     No   Fall or balance concerns reported    No   Warm-up and Cool-down Performed as group-led instruction   Resistance Training Performed Yes   VAD Patient? No   Pain Assessment   Currently in Pain? No/denies   Multiple Pain Sites No      Capillary Blood Glucose: No results found for this or any previous visit (from the past 24 hour(s)).      Exercise Prescription Changes - 05/28/16 1200    Response to Exercise   Blood Pressure (Admit) 100/50 mmHg   Blood Pressure (Exercise) 136/58 mmHg   Blood Pressure (Exit) 100/60 mmHg   Heart Rate (Admit) 68 bpm   Heart Rate (Exercise) 103 bpm   Heart Rate (Exit) 76 bpm   Oxygen Saturation (Admit) 98 %   Oxygen Saturation (Exercise) 92 %   Oxygen Saturation (Exit) 98 %   Rating of Perceived Exertion (Exercise) 13   Perceived Dyspnea (Exercise) 2   Duration Progress to 45 minutes of aerobic exercise without signs/symptoms of physical distress   Intensity THRR unchanged   Progression   Progression Continue to progress workloads to maintain intensity without signs/symptoms of physical distress.   Resistance Training   Training Prescription Yes   Weight orange bands   Reps 10-12   Interval Training   Interval Training No   Oxygen   Oxygen Continuous   Liters 2   Bike   Level 0.3    Minutes 17   NuStep   Level 4   Minutes 17   METs 2.3   Track   Laps 11   Minutes 17     Goals Met:  Improved SOB with ADL's Exercise tolerated well Strength training completed today  Goals Unmet:  Not Applicable  Comments: Service time is from 1030 to 1205    Dr. Rush Farmer is Medical Director for Pulmonary Rehab at [facility].

## 2016-05-29 DIAGNOSIS — B078 Other viral warts: Secondary | ICD-10-CM | POA: Diagnosis not present

## 2016-05-29 DIAGNOSIS — D485 Neoplasm of uncertain behavior of skin: Secondary | ICD-10-CM | POA: Diagnosis not present

## 2016-05-29 DIAGNOSIS — L570 Actinic keratosis: Secondary | ICD-10-CM | POA: Diagnosis not present

## 2016-05-30 ENCOUNTER — Encounter (HOSPITAL_COMMUNITY)
Admission: RE | Admit: 2016-05-30 | Discharge: 2016-05-30 | Disposition: A | Discharge status: Home / Self Care | Payer: Medicare Other | Source: Ambulatory Visit | Attending: Family Medicine | Admitting: Family Medicine

## 2016-05-30 DIAGNOSIS — Z87891 Personal history of nicotine dependence: Secondary | ICD-10-CM | POA: Diagnosis not present

## 2016-05-30 DIAGNOSIS — Z79899 Other long term (current) drug therapy: Secondary | ICD-10-CM | POA: Diagnosis not present

## 2016-05-30 DIAGNOSIS — I272 Other secondary pulmonary hypertension: Secondary | ICD-10-CM | POA: Diagnosis not present

## 2016-05-30 DIAGNOSIS — J439 Emphysema, unspecified: Secondary | ICD-10-CM | POA: Diagnosis not present

## 2016-05-30 NOTE — Progress Notes (Signed)
Daily Session Note  Patient Details  Name: Andrew [name] MRN: [id number] Date of Birth: 1941-07-11 Referring Provider:        Pulmonary Rehab Walk Test from 05/02/2016 in [place]   Referring Provider  Dr. Dorthy Cooler      Encounter Date: 05/30/2016  Check In:     Session Check In - 05/30/16 1240    Check-In   Location [facility]   Staff Present Rosebud Poles, RN, BSN;Lisa Ysidro Evert, RN;Portia Rollene Rotunda, RN, BSN;Ramon Dredge, RN, [facility]   Supervising physician immediately available to respond to emergencies [facility] immediately available   Physician(s) [place]   Medication changes reported     No   Fall or balance concerns reported    No   Warm-up and Cool-down Performed as group-led instruction   Resistance Training Performed Yes   VAD Patient? No   Pain Assessment   Currently in Pain? No/denies   Multiple Pain Sites No      Capillary Blood Glucose: No results found for this or any previous visit (from the past 24 hour(s)).      Exercise Prescription Changes - 05/30/16 1200    Response to Exercise   Blood Pressure (Admit) 108/50 mmHg   Blood Pressure (Exercise) 108/60 mmHg   Blood Pressure (Exit) 94/50 mmHg   Heart Rate (Admit) 73 bpm   Heart Rate (Exercise) 104 bpm   Heart Rate (Exit) 76 bpm   Oxygen Saturation (Admit) 98 %   Oxygen Saturation (Exercise) 92 %   Oxygen Saturation (Exit) 99 %   Rating of Perceived Exertion (Exercise) 11   Perceived Dyspnea (Exercise) 1   Duration Progress to 45 minutes of aerobic exercise without signs/symptoms of physical distress   Intensity THRR unchanged   Progression   Progression Continue to progress workloads to maintain intensity without signs/symptoms of physical distress.   Resistance Training   Training Prescription Yes   Weight orange bands   Reps 10-12   Interval Training   Interval Training No   Oxygen   Oxygen Continuous   Liters 2   Bike   Level 0.4   Minutes 17   Track   Laps 14   Minutes 17     Goals Met:  Improved SOB with ADL's Using PLB without cueing & demonstrates good technique Exercise tolerated well Strength training completed today  Goals Unmet:  Not Applicable  Comments: Service time is from 1030 to 1220    Dr. Rush Farmer is Medical Director for Pulmonary Rehab at [facility].

## 2016-05-30 NOTE — Progress Notes (Signed)
Pulmonary Individual Treatment Plan  Patient Details  Name: Andrew [name] MRN: [id number] Date of Birth: [birth year] Referring Provider: Dr. Lenna Gilford      Pulmonary Rehab Walk Test from 05/02/2016 in [place]   Referring Provider  Dr. Dorthy Cooler      Initial Encounter Date:       Pulmonary Rehab Walk Test from 05/02/2016 in [place]   Date  05/02/16   Referring Provider  Dr. Dorthy Cooler      Visit Diagnosis: Pulmonary emphysema, unspecified emphysema type ([place])  Patient's Home Medications on Admission:   Current outpatient prescriptions:  .  ADVAIR DISKUS 250-50 MCG/DOSE AEPB, Inhale 1 puff into the lungs 2 (two) times daily., Disp: 60 each, Rfl: 2 .  albuterol (PROVENTIL HFA;VENTOLIN HFA) 108 (90 Base) MCG/ACT inhaler, Inhale 2 puffs into the lungs every 6 (six) hours as needed for wheezing or shortness of breath., Disp: 1 Inhaler, Rfl: 2 .  Ascorbic Acid (VITAMIN C PO), Take 500 mg by mouth daily. , Disp: , Rfl:  .  CALCIUM PO, Take 500 mg by mouth daily. , Disp: , Rfl:  .  diltiazem (TIAZAC) 240 MG 24 hr capsule, Take 240 mg by mouth daily., Disp: , Rfl:  .  esomeprazole (NEXIUM) 20 MG capsule, Take 20 mg by mouth 2 (two) times daily before a meal. , Disp: , Rfl:  .  finasteride (PROSCAR) 5 MG tablet, Take 5 mg by mouth daily. , Disp: , Rfl:  .  ipratropium-albuterol (DUONEB) 0.5-2.5 (3) MG/3ML SOLN, Take 3 mLs by nebulization every 6 (six) hours as needed. Dx: J44.9 (Patient taking differently: Take 3 mLs by nebulization 3 (three) times daily. Dx: J44.9), Disp: 360 mL, Rfl: 12 .  Multiple Vitamins-Minerals (MULTIVITAMIN WITH MINERALS) tablet, Take 1 tablet by mouth daily., Disp: , Rfl:  .  polyethylene glycol (MIRALAX / GLYCOLAX) packet, Take 17 g by mouth daily., Disp: 14 each, Rfl: 1 .  rosuvastatin (CRESTOR) 10 MG tablet, Take 10 mg by mouth daily., Disp: , Rfl:  .  sildenafil (REVATIO) 20 MG tablet, Take 20 mg by mouth 2  (two) times daily. , Disp: , Rfl:  .  sodium phosphate (FLEET) 7-19 GM/118ML ENEM, Place 133 mLs (1 enema total) rectally daily as needed for moderate constipation or severe constipation., Disp: 10 enema, Rfl: 0 .  SPIRIVA HANDIHALER 18 MCG inhalation capsule, INHALE THE CONTENTS OF 1 CAPSULE EVERY DAY, Disp: 90 capsule, Rfl: 2 .  tamsulosin (FLOMAX) 0.4 MG CAPS capsule, Take 1 capsule (0.4 mg total) by mouth daily after breakfast., Disp: 30 capsule, Rfl: 0 .  temazepam (RESTORIL) 30 MG capsule, Take 30 mg by mouth at bedtime., Disp: , Rfl:  .  vitamin E 1000 UNIT capsule, Take 1,000 Units by mouth daily., Disp: , Rfl:   Past Medical History: Past Medical History  Diagnosis Date  . Emphysema lung ([place])   . Hypertension   . Pulmonary hypertension ([facility])     Tobacco Use: History  Smoking status  . Former Smoker -- 0.00 packs/day for 0 years  . Types: Cigarettes  . Quit date: 10/01/1984  Smokeless tobacco  . Not on file    Labs: Recent Review Flowsheet Data    Labs for ITP Cardiac and Pulmonary Rehab Latest Ref Rng 01/02/2016 01/10/2016   Hemoglobin A1c 4.8 - 5.6 % 6.2(H) -   TCO2 0 - 100 mmol/L - 26      Capillary Blood Glucose: No results found  for: GLUCAP   ADL UCSD:     Pulmonary Assessment Scores      05/09/16 0756       ADL UCSD   ADL Phase Entry     SOB Score total 66        Pulmonary Function Assessment:   Exercise Target Goals:    Exercise Program Goal: Individual exercise prescription set with THRR, safety & activity barriers. Participant demonstrates ability to understand and report RPE using BORG scale, to self-measure pulse accurately, and to acknowledge the importance of the exercise prescription.  Exercise Prescription Goal: Starting with aerobic activity 30 plus minutes a day, 3 days per week for initial exercise prescription. Provide home exercise prescription and guidelines that participant acknowledges understanding prior to  discharge.  Activity Barriers & Risk Stratification:     Activity Barriers & Cardiac Risk Stratification - 04/29/16 1233    Activity Barriers & Cardiac Risk Stratification   Activity Barriers Shortness of Breath;Muscular Weakness;Deconditioning      6 Minute Walk:     6 Minute Walk      05/02/16 1704       6 Minute Walk   Phase Initial     Distance 900 feet     Walk Time 5.45 minutes     # of Rest Breaks 1     MPH 1.77     METS 2.77     RPE 13     Perceived Dyspnea  2     VO2 Peak 9.72     Symptoms No     Resting HR 111 bpm     Resting BP 130/60 mmHg     Max Ex. HR 126 bpm     Max Ex. BP 160/70 mmHg     2 Minute Post BP 160/70 mmHg     Interval HR   Baseline HR 111     1 Minute HR 119     2 Minute HR 122     3 Minute HR 126     4 Minute HR 123     5 Minute HR 120     6 Minute HR 118     2 Minute Post HR 116     Interval Heart Rate? Yes     Interval Oxygen   Interval Oxygen? Yes     Baseline Oxygen Saturation % 94 %     Baseline Liters of Oxygen 2 L     1 Minute Oxygen Saturation % 119 %     1 Minute Liters of Oxygen 2 L     2 Minute Oxygen Saturation % 122 %     2 Minute Liters of Oxygen 2 L     3 Minute Oxygen Saturation % 126 %     3 Minute Liters of Oxygen 2 L     4 Minute Oxygen Saturation % 123 %     4 Minute Liters of Oxygen 2 L     5 Minute Oxygen Saturation % 120 %     5 Minute Liters of Oxygen 2 L     6 Minute Oxygen Saturation % 118 %     6 Minute Liters of Oxygen 2 L     2 Minute Post Oxygen Saturation % 116 %     2 Minute Post Liters of Oxygen 2 L        Initial Exercise Prescription:     Initial Exercise Prescription - 05/03/16 1600    Date of Initial Exercise RX  and Referring Provider   Date 05/02/16   Referring Provider Dr. Dorthy Cooler   Oxygen   Oxygen Continuous   Liters 2   Bike   Level 0.3   Minutes 15   NuStep   Level 2   Minutes 15   METs 1.7   Track   Laps 7   Minutes 15   Prescription Details   Frequency (times  per week) 2   Duration Progress to 45 minutes of aerobic exercise without signs/symptoms of physical distress   Intensity   THRR 40-80% of Max Heartrate 58-115   Ratings of Perceived Exertion 11-13   Perceived Dyspnea 0-4   Progression   Progression Continue progressive overload as per policy without signs/symptoms or physical distress.   Resistance Training   Training Prescription Yes   Weight orange bands   Reps 10-12      Perform Capillary Blood Glucose checks as needed.  Exercise Prescription Changes:     Exercise Prescription Changes      05/09/16 1200 05/14/16 1200 05/16/16 1600 05/23/16 1200 05/28/16 1200   Exercise Review   Progression   Yes Yes    Response to Exercise   Blood Pressure (Admit) 100/50 mmHg 100/46 mmHg 124/60 mmHg 118/72 mmHg 100/50 mmHg   Blood Pressure (Exercise) 130/60 mmHg 132/68 mmHg 120/60 mmHg 138/64 mmHg 136/58 mmHg   Blood Pressure (Exit) 104/60 mmHg 102/60 mmHg 110/60 mmHg 98/60 mmHg 100/60 mmHg   Heart Rate (Admit) 87 bpm 92 bpm 80 bpm 87 bpm 68 bpm   Heart Rate (Exercise) 103 bpm 111 bpm 108 bpm 114 bpm 103 bpm   Heart Rate (Exit) 90 bpm 93 bpm 88 bpm 89 bpm 76 bpm   Oxygen Saturation (Admit) 98 % 99 % 96 % 98 % 98 %   Oxygen Saturation (Exercise) 94 % 92 % 88 % 89 % 92 %   Oxygen Saturation (Exit) 100 % 100 % 88 % 95 % 98 %   Rating of Perceived Exertion (Exercise) 13 12 13 13 13    Perceived Dyspnea (Exercise) 2 1 3 2 2    Duration Progress to 45 minutes of aerobic exercise without signs/symptoms of physical distress Progress to 45 minutes of aerobic exercise without signs/symptoms of physical distress Progress to 45 minutes of aerobic exercise without signs/symptoms of physical distress Progress to 45 minutes of aerobic exercise without signs/symptoms of physical distress Progress to 45 minutes of aerobic exercise without signs/symptoms of physical distress   Intensity THRR unchanged THRR unchanged THRR unchanged THRR unchanged THRR unchanged    Progression   Progression Continue progressive overload as per policy without signs/symptoms or physical distress. Continue progressive overload as per policy without signs/symptoms or physical distress. Continue progressive overload as per policy without signs/symptoms or physical distress. Continue to progress workloads to maintain intensity without signs/symptoms of physical distress. Continue to progress workloads to maintain intensity without signs/symptoms of physical distress.   Resistance Training   Training Prescription Yes Yes Yes Yes Yes   Weight orange bands orange bands orange bands orange bands orange bands   Reps 10-12 10-12 10-12 10-12 10-12   Interval Training   Interval Training   Yes No No   Oxygen   Oxygen Continuous Continuous Continuous Continuous Continuous   Liters 2 2 2 2 2    Bike   Level 0.3 0.3 0.3 0.4 0.3   Minutes 15 17 17 17 17    NuStep   Level  1 3 4 4    Minutes  [id number]  17 17   METs  1.6 1.9 2.4 2.3   Track   Laps 8 12   11    Minutes 15 17   17       Exercise Comments:     Exercise Comments      05/30/16 0807           Exercise Comments Patient is tolerating workload increases and is showing improvement in stamina and strength          Discharge Exercise Prescription (Final Exercise Prescription Changes):     Exercise Prescription Changes - 05/28/16 1200    Response to Exercise   Blood Pressure (Admit) 100/50 mmHg   Blood Pressure (Exercise) 136/58 mmHg   Blood Pressure (Exit) 100/60 mmHg   Heart Rate (Admit) 68 bpm   Heart Rate (Exercise) 103 bpm   Heart Rate (Exit) 76 bpm   Oxygen Saturation (Admit) 98 %   Oxygen Saturation (Exercise) 92 %   Oxygen Saturation (Exit) 98 %   Rating of Perceived Exertion (Exercise) 13   Perceived Dyspnea (Exercise) 2   Duration Progress to 45 minutes of aerobic exercise without signs/symptoms of physical distress   Intensity THRR unchanged   Progression   Progression Continue to progress workloads  to maintain intensity without signs/symptoms of physical distress.   Resistance Training   Training Prescription Yes   Weight orange bands   Reps 10-12   Interval Training   Interval Training No   Oxygen   Oxygen Continuous   Liters 2   Bike   Level 0.3   Minutes 17   NuStep   Level 4   Minutes 17   METs 2.3   Track   Laps 11   Minutes 17       Nutrition:  Target Goals: Understanding of nutrition guidelines, daily intake of sodium 1500mg , cholesterol 200mg , calories 30% from fat and 7% or less from saturated fats, daily to have 5 or more servings of fruits and vegetables.  Biometrics:     Pre Biometrics - 04/29/16 1235    Pre Biometrics   Grip Strength 28 kg       Nutrition Therapy Plan and Nutrition Goals:   Nutrition Discharge: Rate Your Plate Scores:   Psychosocial: Target Goals: Acknowledge presence or absence of depression, maximize coping skills, provide positive support system. Participant is able to verbalize types and ability to use techniques and skills needed for reducing stress and depression.  Initial Review & Psychosocial Screening:     Initial Psych Review & Screening - 04/29/16 1243    Initial Review   Current issues with --  none identified   Family Dynamics   Good Support System? Yes   Barriers   Psychosocial barriers to participate in program There are no identifiable barriers or psychosocial needs.   Screening Interventions   Interventions Encouraged to exercise      Quality of Life Scores:     Quality of Life - 05/09/16 0755    Quality of Life Scores   Health/Function Pre 21.83 %   Socioeconomic Pre 24.43 %   Psych/Spiritual Pre 22.5 %   Family Pre 25.5 %   GLOBAL Pre 22.97 %      PHQ-9:     Recent Review Flowsheet Data    Depression screen [facility] 2/9 04/29/2016   Decreased Interest 0   Down, Depressed, Hopeless 0   PHQ - 2 Score 0      Psychosocial Evaluation and Intervention:     Psychosocial  Evaluation -  04/29/16 1247    Psychosocial Evaluation & Interventions   Interventions Encouraged to exercise with the program and follow exercise prescription   Comments none identified   Continued Psychosocial Services Needed No      Psychosocial Re-Evaluation:     Psychosocial Re-Evaluation      05/30/16 0716           Psychosocial Re-Evaluation   Interventions Encouraged to attend Pulmonary Rehabilitation for the exercise       Comments no psychosocial barriers identified within his first 30 days         Education: Education Goals: Education classes will be provided on a weekly basis, covering required topics. Participant will state understanding/return demonstration of topics presented.  Learning Barriers/Preferences:     Learning Barriers/Preferences - 04/29/16 1233    Learning Barriers/Preferences   Learning Barriers None   Learning Preferences Skilled Demonstration;Group Instruction      Education Topics: Risk Factor Reduction:  -Group instruction that is supported by a PowerPoint presentation. Instructor discusses the definition of a risk factor, different risk factors for pulmonary disease, and how the heart and lungs work together.     Nutrition for Pulmonary Patient:  -Group instruction provided by PowerPoint slides, verbal discussion, and written materials to support subject matter. The instructor gives an explanation and review of healthy diet recommendations, which includes a discussion on weight management, recommendations for fruit and vegetable consumption, as well as protein, fluid, caffeine, fiber, sodium, sugar, and alcohol. Tips for eating when patients are short of breath are discussed.   Pursed Lip Breathing:  -Group instruction that is supported by demonstration and informational handouts. Instructor discusses the benefits of pursed lip and diaphragmatic breathing and detailed demonstration on how to preform both.            PULMONARY REHAB OTHER RESPIRATORY  from 05/23/2016 in [place]   Date  05/09/16   Educator  rt   Instruction Review Code  2- meets goals/outcomes      Oxygen Safety:  -Group instruction provided by PowerPoint, verbal discussion, and written material to support subject matter. There is an overview of "What is Oxygen" and "Why do we need it".  Instructor also reviews how to create a safe environment for oxygen use, the importance of using oxygen as prescribed, and the risks of noncompliance. There is a brief discussion on traveling with oxygen and resources the patient may utilize.   Oxygen Equipment:  -Group instruction provided by [facility] Staff utilizing handouts, written materials, and equipment demonstrations.      PULMONARY REHAB OTHER RESPIRATORY from 05/23/2016 in [place]   Date  05/16/16   Educator  [employer] rep   Instruction Review Code  2- meets goals/outcomes      Signs and Symptoms:  -Group instruction provided by written material and verbal discussion to support subject matter. Warning signs and symptoms of infection, stroke, and heart attack are reviewed and when to call the physician/911 reinforced. Tips for preventing the spread of infection discussed.   Advanced Directives:  -Group instruction provided by verbal instruction and written material to support subject matter. Instructor reviews Advanced Directive laws and proper instruction for filling out document.   Pulmonary Video:  -Group video education that reviews the importance of medication and oxygen compliance, exercise, good nutrition, pulmonary hygiene, and pursed lip and diaphragmatic breathing for the pulmonary patient.   Exercise for the Pulmonary Patient:  -Group instruction  that is supported by a PowerPoint presentation. Instructor discusses benefits of exercise, core components of exercise, frequency, duration, and intensity of an exercise routine, importance of utilizing  pulse oximetry during exercise, safety while exercising, and options of places to exercise outside of rehab.        PULMONARY REHAB OTHER RESPIRATORY from 05/23/2016 in [place]   Date  05/23/16   Educator  EP   Instruction Review Code  2- meets goals/outcomes      Pulmonary Medications:  -Verbally interactive group education provided by instructor with focus on inhaled medications and proper administration.   Anatomy and Physiology of the Respiratory System and Intimacy:  -Group instruction provided by PowerPoint, verbal discussion, and written material to support subject matter. Instructor reviews respiratory cycle and anatomical components of the respiratory system and their functions. Instructor also reviews differences in obstructive and restrictive respiratory diseases with examples of each. Intimacy, Sex, and Sexuality differences are reviewed with a discussion on how relationships can change when diagnosed with pulmonary disease. Common sexual concerns are reviewed.   Knowledge Questionnaire Score:     Knowledge Questionnaire Score - 05/09/16 0754    Knowledge Questionnaire Score   Pre Score 10/13      Core Components/Risk Factors/Patient Goals at Admission:     Personal Goals and Risk Factors at Admission - 04/29/16 1239    Core Components/Risk Factors/Patient Goals on Admission   Increase Strength and Stamina Yes   Intervention Provide advice, education, support and counseling about physical activity/exercise needs.;Develop an individualized exercise prescription for aerobic and resistive training based on initial evaluation findings, risk stratification, comorbidities and participant's personal goals.   Expected Outcomes Achievement of increased cardiorespiratory fitness and enhanced flexibility, muscular endurance and strength shown through measurements of functional capacity and personal statement of participant.   Improve shortness of  breath with ADL's Yes   Intervention Provide education, individualized exercise plan and daily activity instruction to help decrease symptoms of SOB with activities of daily living.   Expected Outcomes Short Term: Achieves a reduction of symptoms when performing activities of daily living.   Develop more efficient breathing techniques such as purse lipped breathing and diaphragmatic breathing; and practicing self-pacing with activity Yes   Intervention Provide education, demonstration and support about specific breathing techniuqes utilized for more efficient breathing. Include techniques such as pursed lipped breathing, diaphragmatic breathing and self-pacing activity.   Expected Outcomes Short Term: Participant will be able to demonstrate and use breathing techniques as needed throughout daily activities.      Core Components/Risk Factors/Patient Goals Review:      Goals and Risk Factor Review      04/29/16 1242 05/30/16 0717         Core Components/Risk Factors/Patient Goals Review   Personal Goals Review Increase Strength and Stamina;Develop more efficient breathing techniques such as purse lipped breathing and diaphragmatic breathing and practicing self-pacing with activity.;Improve shortness of breath with ADL's Increase Strength and Stamina;Develop more efficient breathing techniques such as purse lipped breathing and diaphragmatic breathing and practicing self-pacing with activity.;Improve shortness of breath with ADL's      Review Increase in stamina through aerobic exercise see comment section on ITP      Expected Outcomes Improved strength and stamina see admission expected outcomes         Core Components/Risk Factors/Patient Goals at Discharge (Final Review):      Goals and Risk Factor Review - 05/30/16 0717    Core Components/Risk Factors/Patient  Goals Review   Personal Goals Review Increase Strength and Stamina;Develop more efficient breathing techniques such as purse  lipped breathing and diaphragmatic breathing and practicing self-pacing with activity.;Improve shortness of breath with ADL's   Review see comment section on ITP   Expected Outcomes see admission expected outcomes      ITP Comments:   Comments: ITP REVIEW Pt is making expected progress toward personal goals after completing 5 sessions. Recommend continued exercise, life style modification, education, and utilization of breathing techniques to increase stamina and strength and decrease shortness of breath with exertion.

## 2016-06-04 ENCOUNTER — Encounter (HOSPITAL_COMMUNITY)
Admission: RE | Admit: 2016-06-04 | Discharge: 2016-06-04 | Disposition: A | Discharge status: Home / Self Care | Payer: Medicare Other | Source: Ambulatory Visit | Attending: Family Medicine | Admitting: Family Medicine

## 2016-06-04 VITALS — Wt 135.1 lb

## 2016-06-04 DIAGNOSIS — Z79899 Other long term (current) drug therapy: Secondary | ICD-10-CM | POA: Diagnosis not present

## 2016-06-04 DIAGNOSIS — J439 Emphysema, unspecified: Secondary | ICD-10-CM

## 2016-06-04 DIAGNOSIS — Z87891 Personal history of nicotine dependence: Secondary | ICD-10-CM | POA: Diagnosis not present

## 2016-06-04 DIAGNOSIS — I272 Other secondary pulmonary hypertension: Secondary | ICD-10-CM | POA: Diagnosis not present

## 2016-06-04 NOTE — Progress Notes (Signed)
Daily Session Note  Patient Details  Name: Andrew [name] MRN: [id number] Date of Birth: 05/17/1941 Referring Provider:        Pulmonary Rehab Walk Test from 05/02/2016 in [place]   Referring Provider  Dr. Dorthy Cooler      Encounter Date: 06/04/2016  Check In:     Session Check In - 06/04/16 1126    Check-In   Staff Present Rosebud Poles, RN, BSN;Lisa Ysidro Evert, RN;Portia Rollene Rotunda, RN, BSN;Ramon Dredge, RN, MHA;Molly diVincenzo, MS, ACSM RCEP, Exercise Physiologist   Supervising physician immediately available to respond to emergencies [facility] immediately available   Physician(s) Dr. Marily Memos   Medication changes reported     No   Fall or balance concerns reported    No   Warm-up and Cool-down Performed as group-led instruction   Resistance Training Performed Yes   VAD Patient? No   Pain Assessment   Currently in Pain? No/denies   Multiple Pain Sites No      Capillary Blood Glucose: No results found for this or any previous visit (from the past 24 hour(s)).      Exercise Prescription Changes - 06/04/16 1200    Response to Exercise   Blood Pressure (Admit) 110/54 mmHg   Blood Pressure (Exercise) 124/50 mmHg   Blood Pressure (Exit) 100/60 mmHg   Heart Rate (Admit) 73 bpm   Heart Rate (Exercise) 94 bpm   Heart Rate (Exit) 72 bpm   Oxygen Saturation (Admit) 100 %   Oxygen Saturation (Exercise) 94 %   Oxygen Saturation (Exit) 99 %   Rating of Perceived Exertion (Exercise) 11   Perceived Dyspnea (Exercise) 1   Duration Progress to 45 minutes of aerobic exercise without signs/symptoms of physical distress   Intensity THRR unchanged   Progression   Progression Continue to progress workloads to maintain intensity without signs/symptoms of physical distress.   Resistance Training   Training Prescription Yes   Weight orange bands   Reps 10-12  10 minutes of strength training   Oxygen   Oxygen Continuous   Liters 2   Bike   Level  0.4   Minutes 17   NuStep   Level 4   Minutes 17   METs 2.9   Track   Laps 14   Minutes 17     Goals Met:  Exercise tolerated well Strength training completed today  Goals Unmet:  Not Applicable  Comments: Service time is from 1030 to 1205    Dr. Rush Farmer is Medical Director for Pulmonary Rehab at [facility].

## 2016-06-06 ENCOUNTER — Telehealth: Payer: Self-pay | Admitting: Pulmonary Disease

## 2016-06-06 ENCOUNTER — Encounter (HOSPITAL_COMMUNITY)
Admission: RE | Admit: 2016-06-06 | Discharge: 2016-06-06 | Disposition: A | Discharge status: Home / Self Care | Payer: Medicare Other | Source: Ambulatory Visit | Attending: Family Medicine | Admitting: Family Medicine

## 2016-06-06 VITALS — Wt 135.8 lb

## 2016-06-06 DIAGNOSIS — J439 Emphysema, unspecified: Secondary | ICD-10-CM

## 2016-06-06 DIAGNOSIS — I272 Other secondary pulmonary hypertension: Secondary | ICD-10-CM | POA: Diagnosis not present

## 2016-06-06 DIAGNOSIS — J432 Centrilobular emphysema: Secondary | ICD-10-CM

## 2016-06-06 DIAGNOSIS — Z87891 Personal history of nicotine dependence: Secondary | ICD-10-CM | POA: Diagnosis not present

## 2016-06-06 DIAGNOSIS — Z79899 Other long term (current) drug therapy: Secondary | ICD-10-CM | POA: Diagnosis not present

## 2016-06-06 NOTE — Telephone Encounter (Signed)
Called and lm for molly to return our call.  i dont see where a POC was discussed with pt by SN.

## 2016-06-06 NOTE — Progress Notes (Signed)
Daily Session Note  Patient Details  Name: Andrew [name] MRN: [id number] Date of Birth: Jan 26, 1941 Referring Provider:        Pulmonary Rehab Walk Test from 05/02/2016 in [place]   Referring Provider  Dr. Dorthy Cooler      Encounter Date: 06/06/2016  Check In:     Session Check In - 06/06/16 1016    Check-In   Location [facility]   Staff Present Su Hilt, MS, ACSM RCEP, Exercise Physiologist;Portia Rollene Rotunda, RN, Maxcine Ham, RN, BSN   Supervising physician immediately available to respond to emergencies [facility] immediately available   Physician(s) Dr. Cruzita Lederer   Medication changes reported     No   Fall or balance concerns reported    No   Warm-up and Cool-down Performed as group-led instruction   Resistance Training Performed Yes   VAD Patient? No   Pain Assessment   Currently in Pain? No/denies   Multiple Pain Sites No      Capillary Blood Glucose: No results found for this or any previous visit (from the past 24 hour(s)).      Exercise Prescription Changes - 06/06/16 1300    Response to Exercise   Blood Pressure (Admit) 110/54 mmHg   Blood Pressure (Exercise) 136/78 mmHg   Blood Pressure (Exit) 102/50 mmHg   Heart Rate (Admit) 75 bpm   Heart Rate (Exercise) 99 bpm   Heart Rate (Exit) 78 bpm   Oxygen Saturation (Admit) 99 %   Oxygen Saturation (Exercise) 94 %   Oxygen Saturation (Exit) 99 %   Rating of Perceived Exertion (Exercise) 11   Perceived Dyspnea (Exercise) 1   Duration Progress to 45 minutes of aerobic exercise without signs/symptoms of physical distress   Intensity THRR unchanged   Progression   Progression Continue to progress workloads to maintain intensity without signs/symptoms of physical distress.   Resistance Training   Training Prescription Yes   Weight orange bands   Reps 10-12  10 minutes of strength training   Oxygen   Oxygen Continuous   Liters 2   NuStep   Level 4    Minutes 17   METs 2.2   Track   Laps 16   Minutes 17     Goals Met:  Exercise tolerated well No report of cardiac concerns or symptoms Strength training completed today  Goals Unmet:  Not Applicable  Comments: Service time is from 10:30am to 12:30pm    Dr. Rush Farmer is Medical Director for Pulmonary Rehab at [facility].

## 2016-06-07 NOTE — Telephone Encounter (Signed)
Molly from pulm rehab calling back [phone]

## 2016-06-07 NOTE — Telephone Encounter (Signed)
LM for molly to return call.

## 2016-06-07 NOTE — Telephone Encounter (Signed)
LM for Molly to return call. 

## 2016-06-11 ENCOUNTER — Encounter (HOSPITAL_COMMUNITY)
Admission: RE | Admit: 2016-06-11 | Discharge: 2016-06-11 | Disposition: A | Discharge status: Home / Self Care | Payer: Medicare Other | Source: Ambulatory Visit | Attending: Family Medicine | Admitting: Family Medicine

## 2016-06-11 VITALS — Wt 134.9 lb

## 2016-06-11 DIAGNOSIS — J439 Emphysema, unspecified: Secondary | ICD-10-CM

## 2016-06-11 DIAGNOSIS — Z79899 Other long term (current) drug therapy: Secondary | ICD-10-CM | POA: Diagnosis not present

## 2016-06-11 DIAGNOSIS — I272 Other secondary pulmonary hypertension: Secondary | ICD-10-CM | POA: Diagnosis not present

## 2016-06-11 DIAGNOSIS — Z87891 Personal history of nicotine dependence: Secondary | ICD-10-CM | POA: Diagnosis not present

## 2016-06-11 NOTE — Telephone Encounter (Signed)
Last ov with SN on 03/18/16 Instructions   Patient Instructions    Today we updated your med list in our EPIC system...    Continue your current medications the same...  We decided to change your NEBULIZER medication from plain Albuterol to the combination DUONEB>    Use in NEBULIZER three times daily followed by the [place] as we discussed...  It looks like you will need a diuretic added to eliminate the edema>    Be sure to avoid sodium (salt) in your diet...    Your PCP should prescribe a diuretic- let me know if you have any questions...  Try to stay as active as possible, but avoid upper resp infections!!!  Call for any questions...  Let's plan a follow up visit in 42mo, sooner if needed for problems...     Called spoke with [facility] at Pulmonary rehab. She states that the patient has been complaining that he has not received his POC. I explained to her that a order was not placed for a POC. She requested a message be sent to [facility] for his recs. She states that the patient is currently using 2L continuously with activity at pulmonary rehab. I informed her that I would send the message and return her call with SN's recs. She voiced understanding and had no further questions.   SN please advise

## 2016-06-11 NOTE — Progress Notes (Signed)
Daily Session Note  Patient Details  Name: Andrew [name] MRN: [id number] Date of Birth: 1941-06-05 Referring Provider:   [name] Pulmonary Rehab Walk Test from 05/02/2016 in [place]  Referring Provider  Dr. Dorthy Cooler      Encounter Date: 06/11/2016  Check In:     Session Check In - 06/11/16 1012      Check-In   Location [facility]   Staff Present Rosebud Poles, RN, BSN;Lisa Ysidro Evert, RN;Portia Rollene Rotunda, RN, BSN;Ramon Dredge, RN, MHA;Olinty Celesta Aver, MS, ACSM CEP, Exercise Physiologist   Supervising physician immediately available to respond to emergencies [facility] immediately available   Physician(s) Dr. Marily Memos   Medication changes reported     No   Fall or balance concerns reported    No   Warm-up and Cool-down Performed as group-led instruction   Resistance Training Performed Yes   VAD Patient? No     Pain Assessment   Currently in Pain? No/denies   Multiple Pain Sites No      Capillary Blood Glucose: No results found for this or any previous visit (from the past 24 hour(s)).      Exercise Prescription Changes - 06/11/16 1200      Response to Exercise   Blood Pressure (Admit) 104/60   Blood Pressure (Exercise) 132/70   Blood Pressure (Exit) 118/66   Heart Rate (Admit) 79 bpm   Heart Rate (Exercise) 114 bpm   Heart Rate (Exit) 80 bpm   Oxygen Saturation (Admit) 99 %   Oxygen Saturation (Exercise) 92 %   Oxygen Saturation (Exit) 99 %   Rating of Perceived Exertion (Exercise) 12   Perceived Dyspnea (Exercise) 1   Duration Progress to 45 minutes of aerobic exercise without signs/symptoms of physical distress   Intensity THRR unchanged     Progression   Progression Continue to progress workloads to maintain intensity without signs/symptoms of physical distress.     Resistance Training   Training Prescription Yes   Weight orange bands   Reps 10-12  10 minutes of strength training     Interval Training   Interval Training No     Oxygen   Oxygen Continuous   Liters 2     Bike   Level 0.4   Minutes 17     NuStep   Level 4   Minutes 17   METs 2.5     Track   Laps 15   Minutes 17     Goals Met:  [place] with exercise equipment Improved SOB with ADL's Exercise tolerated well Strength training completed today  Goals Unmet:  Not Applicable  Comments: Service time is from 1030 to 1215    Dr. Rush Farmer is Medical Director for Pulmonary Rehab at [facility].

## 2016-06-11 NOTE — Telephone Encounter (Signed)
Called and lm for molly to return my call

## 2016-06-13 ENCOUNTER — Encounter (HOSPITAL_COMMUNITY)
Admission: RE | Admit: 2016-06-13 | Discharge: 2016-06-13 | Disposition: A | Discharge status: Home / Self Care | Payer: Medicare Other | Source: Ambulatory Visit | Attending: Family Medicine | Admitting: Family Medicine

## 2016-06-13 VITALS — Wt 136.9 lb

## 2016-06-13 DIAGNOSIS — Z87891 Personal history of nicotine dependence: Secondary | ICD-10-CM | POA: Diagnosis not present

## 2016-06-13 DIAGNOSIS — J439 Emphysema, unspecified: Secondary | ICD-10-CM | POA: Diagnosis not present

## 2016-06-13 DIAGNOSIS — Z79899 Other long term (current) drug therapy: Secondary | ICD-10-CM | POA: Diagnosis not present

## 2016-06-13 DIAGNOSIS — I272 Other secondary pulmonary hypertension: Secondary | ICD-10-CM | POA: Diagnosis not present

## 2016-06-13 NOTE — Telephone Encounter (Signed)
Called pt, LM to return call x 1

## 2016-06-13 NOTE — Telephone Encounter (Signed)
Spoke with the patient - pt states that he currently has O2 bottles and wants something lighter weight and more portable. Pt states that he used to have one in the past. Pt is requesting if we order something to make sure that it is specified in the order that it can be used to travel with and is Cytogeneticist Approved.  Pt states that we should have record of his previous use of a POC in the past - pt states that his previous POC came from [facility] here in [place], not [place]. Pt states that he has been working on trying to his this ordered for 4 months and wants this done as soon as possible.   Please advise Dr Lenna Gilford if you are okay with [place] ordering a POC eval with [facility] for a small, lightweight POC. Thanks.

## 2016-06-13 NOTE — Telephone Encounter (Signed)
Per SN---  At pts last ov the POC was not discussed with SN.  Pt has been on his oxygen since 2007 with [facility] in [place]. He came to [place] on oxygen at 2 liters during the days and BIPAP at night.    Need to speak with the pt about issues that he may be having. thanks

## 2016-06-13 NOTE — Progress Notes (Signed)
Daily Session Note  Patient Details  Name: Andrew [name] MRN: [id number] Date of Birth: December 27, 1940 Referring Provider:   [name] Pulmonary Rehab Walk Test from 05/02/2016 in [place]  Referring Provider  Dr. Dorthy Cooler      Encounter Date: 06/13/2016  Check In:     Session Check In - 06/13/16 1030      Check-In   Location [facility]   Staff Present Rosebud Poles, RN, Luisa Hart, RN, BSN;Ramon Dredge, RN, MHA;Molly diVincenzo, MS, ACSM RCEP, Exercise Physiologist   Supervising physician immediately available to respond to emergencies [facility] immediately available   Physician(s) Dr. Waldron Labs   Medication changes reported     No   Fall or balance concerns reported    No   Warm-up and Cool-down Performed as group-led instruction   Resistance Training Performed Yes   VAD Patient? No     Pain Assessment   Currently in Pain? No/denies   Multiple Pain Sites No      Capillary Blood Glucose: No results found for this or any previous visit (from the past 24 hour(s)).      Exercise Prescription Changes - 06/13/16 1254      Response to Exercise   Blood Pressure (Admit) 104/60   Blood Pressure (Exercise) 148/64   Blood Pressure (Exit) 118/60   Heart Rate (Admit) 72 bpm   Heart Rate (Exercise) 85 bpm   Heart Rate (Exit) 78 bpm   Oxygen Saturation (Admit) 100 %   Oxygen Saturation (Exercise) 96 %   Oxygen Saturation (Exit) 97 %   Rating of Perceived Exertion (Exercise) 11   Perceived Dyspnea (Exercise) 1   Duration Progress to 45 minutes of aerobic exercise without signs/symptoms of physical distress   Intensity THRR unchanged     Progression   Progression Continue to progress workloads to maintain intensity without signs/symptoms of physical distress.     Resistance Training   Training Prescription Yes   Weight orange bands   Reps 10-12  10 minutes of strength training     Interval Training    Interval Training No     Oxygen   Oxygen Continuous   Liters 2     Bike   Level 0.4   Minutes 17     NuStep   Level 4   Minutes 17   METs 2.4     Goals Met:  Improved SOB with ADL's Using PLB without cueing & demonstrates good technique Exercise tolerated well No report of cardiac concerns or symptoms Strength training completed today  Goals Unmet:  Not Applicable  Comments: Service time is from 1030 to 1210   Dr. Rush Farmer is Medical Director for Pulmonary Rehab at [facility].

## 2016-06-13 NOTE — Telephone Encounter (Signed)
Per SN---  Ok to place the order for the POC.  This has been done and I have called the pt to make him aware.  Nothing further is needed.

## 2016-06-14 DIAGNOSIS — K219 Gastro-esophageal reflux disease without esophagitis: Secondary | ICD-10-CM | POA: Diagnosis not present

## 2016-06-14 DIAGNOSIS — I272 Other secondary pulmonary hypertension: Secondary | ICD-10-CM | POA: Diagnosis not present

## 2016-06-14 DIAGNOSIS — G4700 Insomnia, unspecified: Secondary | ICD-10-CM | POA: Diagnosis not present

## 2016-06-14 DIAGNOSIS — I10 Essential (primary) hypertension: Secondary | ICD-10-CM | POA: Diagnosis not present

## 2016-06-14 DIAGNOSIS — E041 Nontoxic single thyroid nodule: Secondary | ICD-10-CM | POA: Diagnosis not present

## 2016-06-14 DIAGNOSIS — Z79899 Other long term (current) drug therapy: Secondary | ICD-10-CM | POA: Diagnosis not present

## 2016-06-14 DIAGNOSIS — J439 Emphysema, unspecified: Secondary | ICD-10-CM | POA: Diagnosis not present

## 2016-06-18 ENCOUNTER — Encounter (HOSPITAL_COMMUNITY)
Admission: RE | Admit: 2016-06-18 | Discharge: 2016-06-18 | Disposition: A | Discharge status: Home / Self Care | Payer: Medicare Other | Source: Ambulatory Visit | Attending: Family Medicine | Admitting: Family Medicine

## 2016-06-18 VITALS — Wt 138.0 lb

## 2016-06-18 DIAGNOSIS — I272 Other secondary pulmonary hypertension: Secondary | ICD-10-CM | POA: Insufficient documentation

## 2016-06-18 DIAGNOSIS — Z87891 Personal history of nicotine dependence: Secondary | ICD-10-CM | POA: Insufficient documentation

## 2016-06-18 DIAGNOSIS — J439 Emphysema, unspecified: Secondary | ICD-10-CM | POA: Insufficient documentation

## 2016-06-18 DIAGNOSIS — D485 Neoplasm of uncertain behavior of skin: Secondary | ICD-10-CM | POA: Diagnosis not present

## 2016-06-18 DIAGNOSIS — D225 Melanocytic nevi of trunk: Secondary | ICD-10-CM | POA: Diagnosis not present

## 2016-06-18 DIAGNOSIS — Z79899 Other long term (current) drug therapy: Secondary | ICD-10-CM | POA: Diagnosis not present

## 2016-06-18 DIAGNOSIS — B078 Other viral warts: Secondary | ICD-10-CM | POA: Diagnosis not present

## 2016-06-18 DIAGNOSIS — C44629 Squamous cell carcinoma of skin of left upper limb, including shoulder: Secondary | ICD-10-CM | POA: Diagnosis not present

## 2016-06-18 NOTE — Progress Notes (Signed)
Daily Session Note  Patient Details  Name: Andrew [name] MRN: [id number] Date of Birth: 05/20/1941 Referring Provider:   [name] Pulmonary Rehab Walk Test from 05/02/2016 in [place]  Referring Provider  Dr. Dorthy Cooler      Encounter Date: 06/18/2016  Check In:     Session Check In - 06/18/16 1016      Check-In   Location [facility]   Staff Present Rosebud Poles, RN, Luisa Hart, RN, BSN;Ramon Dredge, RN, MHA;Rhona Fusilier Ysidro Evert, RN;Molly diVincenzo, MS, ACSM RCEP, Exercise Physiologist   Supervising physician immediately available to respond to emergencies [facility] immediately available   Physician(s) Dr. Marily Memos   Medication changes reported     No   Fall or balance concerns reported    No   Warm-up and Cool-down Performed as group-led instruction   Resistance Training Performed Yes   VAD Patient? No     Pain Assessment   Currently in Pain? No/denies   Multiple Pain Sites No      Capillary Blood Glucose: No results found for this or any previous visit (from the past 24 hour(s)).      Exercise Prescription Changes - 06/18/16 1200      Response to Exercise   Blood Pressure (Admit) 106/60   Blood Pressure (Exercise) 122/46   Blood Pressure (Exit) 106/70   Heart Rate (Admit) 82 bpm   Heart Rate (Exercise) 98 bpm   Heart Rate (Exit) 80 bpm   Oxygen Saturation (Admit) 98 %   Oxygen Saturation (Exercise) 94 %   Oxygen Saturation (Exit) 100 %   Rating of Perceived Exertion (Exercise) 11   Perceived Dyspnea (Exercise) 1   Duration Progress to 45 minutes of aerobic exercise without signs/symptoms of physical distress   Intensity THRR unchanged     Progression   Progression Continue to progress workloads to maintain intensity without signs/symptoms of physical distress.     Resistance Training   Training Prescription Yes   Weight orange bands   Reps 10-12  10 minutes of strength training     Interval Training   Interval Training No     Oxygen   Oxygen Continuous   Liters 2     Bike   Level 0.4   Minutes 17     NuStep   Level 4   Minutes 17   METs 2.4     Track   Laps 15   Minutes 17     Goals Met:  Exercise tolerated well No report of cardiac concerns or symptoms Strength training completed today  Goals Unmet:  Not Applicable  Comments: Service time is from 1030 to 1200     Dr. Rush Farmer is Medical Director for Pulmonary Rehab at [facility].

## 2016-06-20 ENCOUNTER — Encounter (HOSPITAL_COMMUNITY): Payer: Medicare Other

## 2016-06-20 ENCOUNTER — Ambulatory Visit (INDEPENDENT_AMBULATORY_CARE_PROVIDER_SITE_OTHER): Payer: Medicare Other | Admitting: Pulmonary Disease

## 2016-06-20 ENCOUNTER — Encounter: Payer: Self-pay | Admitting: Pulmonary Disease

## 2016-06-20 VITALS — BP 120/62 | HR 76 | Temp 97.9°F | Ht 67.0 in | Wt 136.1 lb

## 2016-06-20 DIAGNOSIS — I272 Other secondary pulmonary hypertension: Secondary | ICD-10-CM | POA: Diagnosis not present

## 2016-06-20 DIAGNOSIS — J9611 Chronic respiratory failure with hypoxia: Secondary | ICD-10-CM | POA: Diagnosis not present

## 2016-06-20 DIAGNOSIS — J432 Centrilobular emphysema: Secondary | ICD-10-CM | POA: Diagnosis not present

## 2016-06-20 DIAGNOSIS — G4733 Obstructive sleep apnea (adult) (pediatric): Secondary | ICD-10-CM

## 2016-06-20 NOTE — Patient Instructions (Addendum)
Today we updated your med list in our EPIC system...    Continue your current medications the same...  Keep up the great job w/ your PULMONARY REHAB...  Be sure to get the 2017 Flu vaccine when avail...  Call for any questions...  Let's plan a follow up visit in 69mo, sooner if needed for problems.Marland KitchenMarland Kitchen

## 2016-06-20 NOTE — Progress Notes (Signed)
Subjective:     Patient ID: Andrew [name], male   DOB: 1940/12/26, 75 y.o.   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 Lujean Amel, [name]...       Aliberti is a 75 y/o gentleman from [place]- moved here to [place] ~37mo ago to live w/ his daughter & son-in-law;  He has a long convoluted history & we have none of his prev objective data to review;  He tells me that he has known about COPD/Emphysema for >10 yrs and in 2007 he had right thoracotomy & "bleb-ectomy";  After this procedure he was placed on Oxygen at 2L/min and BiPAP to use at night, along w/ ADVAIR250Bid & SPIRIVA daily, plus REVATIO20Tid for pulmonary HTN;  He was also treated w/ NEBS for about 1-107yrs then this was discontinued;  He has pretty much been on this same regimen for the past 9 yrs w/o much change, despite or maybe because he has moved around a lot- [employer] (surg done there in 2007), to [place], back to [place], on to [place] for 6 yrs, then back to [place] over the last yr or so...  He describes himself as being rock-solid stable on this exact regimen since 2007- he had Cath (?left & right heart) in 2007, told 1 blockage, good LVF ?right heart results, started on O2, BiPAP, and Revatio but he does not know why?  He notes min cough when supine & in early AM attributed to reflux; min if any sput production, no hemoptysis, he denies SOB but states DOE "if I over-exert" eg- walking, lifting/carrying, stairs, etc; he notes that ADLs are ok- no problem (he is stoic);  He denies CP, palpit, f/c/s, edema... He hasn't been  to an ER since 2007 he says & that was also the last time he had any Pred; he thinks he had CXR, PFT, 2DEcho all earlier this yr...   Smoking Hx>  He is an ex-smoker, started in his teens, smoked for 30 yrs up to 1ppd, quit in 1986; This is a 30 pack-yr hx, he does not recall ever being checked for A1AT defic...  Pulmonary Hx>  COPD/ Emphysema w/ right upper lobe "bleb-ectomy) 2007 in [place]; he has chronic hypoxemic resp failure on O2 at 2L/min since 2007;  He tells me that he was started on BiPAP about that same time but he doesn't know why- never had sleep study, not on CPAP prev, he does not know about pCO2 levels etc ("I like the fresh cool air");  His BiPAP came from [facility] in [place]- states he does not know the settings, machine never downloaded, etc;  He has also been on Revatio20Tid since 2007, apparently never tried on other meds, dose never adjusted- he knows about "pulmonary hypertension" but he doesn't know any details and it doesn't appear to have been followed up, and meds kept the same from doctor to doctor...   Medical Hx>  HBP, ?nonobstructive CAD, HL, thyroid nodule, GERD, constipation, BPH, insomnia...  Family Hx>  Father died  w/ Emphysema & was a former smoker; no other hx lung dis in the family; Alpha-1 status is unknown...  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   Current Meds>  Oxygen 2L/min pulse-dose concentrator, Advair250Bid, Spiriva daily, Revatio20Tid, CardizemCD240, Crestor10, Nexium20, Proscar5, Restoril30...  EXAM shows Afeb, VSS, O2sat=93% on 2L/min pulse-dose;  Heent- neg, mallampati1;  Chest- decr BS at bases, can't augment BS voluntarily, w/o w/r/r;  Heart- RR w/o m/r/g;  Abd- soft, neg;  Ext- neg w/o c/c/e;  Neuro- intact...  CXR 10/02/15 showed norm heart size,  COPD, bullous emphysema/ hyperinflation, scarring right apex, NAD...   Spirometry 10/02/15 showed FVC=2.70 (69%), FEV1=1.12 (37%), %1sec=41, mid-flows reduced at 18% predicted; this is c/w severe airflow obstruction & GOLD Stage 3 COPD  Ambulatory oxygen saturation test 10/02/15> on O2 at 2L/min pulse-dose concentrator: O2sat=96% on 2L at rest w/ pulse=87; he walked 2 laps w/ his O2, stopped due to dyspnea, lowest O2sat=89% w/ pulse=113/min...  LAB 10/02/15>  Alpha-1-Antitrypsin level => pending (he never went to the lab for this blood test)  2DEchocardiogram 10/09/15 showed norm LVF w/ EF=55-60%, norm wall motion, mild MR, mild RA dil, PAsys est 79mmHg... Pt on Revatio 20Tid x 87yrs & I rec we wean slowly (Decr to Bid now)...    IMP >>     COPD/ bullous emphysema> s/p RUL "bleb-ectomy" in 2007, severe airflow obstruction w/ GOLD Stage3 COPD> on Advair250Bid & Spiriva daily; apparently he has no use for a rescue inhaler; prev on NEB w/ Albut but not for several yrs.     Chronic hypoxemic respiratory failure on O2 at 2L/min via pulse-dose concentrator...    Pt reports using BiPAP since 2007, never been on CPAP, never had sleep study he says, unknown ABGs or pCO2 data; machine from [place] we will try to get the old data => none received.    Hx pulmonary hypertension on Revatio20Tid since 2007 w/o additional med trials or dose adjustments> we do not have any of the objective data from his prev physician teams... Current 2DEcho w/ PAsys est 32mmHg & we will wean the Revatio to Bid at this point => he does not want to wean further for "other" reasons...    Medical issues include:  HBP, ?nonobstructive CAD, HL, thyroid nodule, GERD, BPH, insomnia... PLAN >>     Dakhari has a distinctly patchy history to go along w/ his severe airflow obstruction & bullous emphysema;  We really need his old objective data from 2007 when he was started on O2 & BiPAP after his RUL bleb reduction surg;  He will try to get  names and numbers for [place]- in the meanwhile we will contact his last physician in [facility] for their more recent data as we establish out data base here in [place];  He is very concerned that he wants [place] to continue his current regimen which has served him well over the last 30yrs;  Continue Advair250Bid, Spiriva daily, Revatio20Tid=>Bid, O2 at 2L/min, and the BiPAP nightly as currently set... We plan ROV recheck in 1 month... NOTE> 2DEcho w/ PAsys est ~39mmHg & we will slowly wean Revatio...  ~  November 01, 2015:  19mo ROV & MrBowman reports stable, doing satis & notes no untoward effects from cutting the Revatio to 20mg Bid; he denies CP, palpit, incr SOB,  edema, etc; he continues on the O2 at 2L/min, BiPAP from [place], [place] once daily; we have not received any records from his mult physicians ([place], [place], [place])...    EXAM shows Afeb, VSS, O2sat=92% on 2L/min pulse-dose;  Heent- neg, mallampati1;  Chest- decr BS at bases, can't augment BS voluntarily, w/o w/r/r;  Heart- RR w/o m/r/g;  Abd- soft, neg;  Ext- neg w/o c/c/e;  Neuro- intact... IMP/PLAN>>  See prob list above- he is stable on this regimen but does not want to wean the Revatio further for "other" reasons; OK to continue current med regimen- advised regular exercise vs pulm rehab program; given ZPak for prn use over the winter & he knows to call for any resp issues, incr dyspnea, etc... He remains on BiPAP but we do not have any data- no records received from prev pulm physicians, no notes from [place], no download data from his machine- we will again try to make contact w/ his DME company... We plan ROV in 3-71mo.  ~  Mar 18, 2016:  4-47mo ROV & post hosp visit>  Jontavis has severe COPD/Emphysema, GOLD Stage 3 w/ FEV1=1.12L (37%predicted) in [id number],  Hx right thoracotomy & "bleb-ectomy" in 2007;  after this procedure he says he was placed on Oxygen at 2L/min and BiPAP to use at night, along w/ ADVAIR250Bid, SPIRIVA  daily, and REVATIO20Tid for pulmonary HTN (2DEcho here 11/16 showed PAsys est=20mmHg)-- we do not have any of that data from [place] (we have been unsuccessful in obtaining old data from any of his prev physicians); he has been resistent to any adjustment in his medication regimen for various reasons...     He's been [place] x2 since last OV> 1st [place] 2/9 - 01/03/16 by [facility] w/ CAP- CXR showed severe bullous emphysema, incr markings in RLL and atelectasis in R-mid lung, Temp 103, Lactate=2.4, WBC 16K; treated w/ O2, Solumedrol=>Pred, Zosyn/Vanco=>Levaquin, NEBs, etc; disch home w/ home health help- ?seen by his PCP after disch...    2nd [facility] 3/26 - 02/15/16 by [facility] w/ 1d hx incr SOB, wheezing, productive cough & felt to have a COPD exac; CXR showed his COPE/E, persistent incr markings in right base, WBC was elev at 20K, BNP=60, no pos cultures; he was treaed w/ O2, Solumedrol, Roceph/Zithro, NEBs, etc; he was disch on Levaquin & Pred; he developed urinary retention & foley placed (weaned off in NH); he was debilitated & sent to NH for rehab...     He was disch to [facility] for rehab & attended by [facility] note dated 03/01/16 reviewed-- finished Levaquin, weaned off the Pred, they were able to discontinue the foley & he passed voiding trial; disch home after 19d in rehab...     Now back home on same O2= 2L/min days & 3L/min night w/ BiPAP ?settings (he has been on this for yrs and never re-assessed), NEB w/ AlbutTid, Advair250Bid, Spiriva daily, Revatio20Bid (pt refuses to taper this med further);  He has developed pedal edema x3d & needs a low sodium diet + diuretic started but he tells me he is sched to see his PCP soon for this problem...    EXAM shows Afeb, VSS, O2sat=95% on 2L/min pulse-dose;  Heent- neg, mallampati1;  Chest- decr BS at bases, can't augment BS voluntarily, w/o w/r/r;  Heart- RR w/o m/r/g;  Abd- soft, neg;  Ext- neg w/o c/c/e;  Neuro- intact...  CXR 02/11/16 showed  hyperinflation, bullous emphysema, some scarring in right mid lung & both  lower lobes, no infiltrate, no edema...  LABS 01/2016> all reviewed in Epic... IMP/PLAN>>  Elnora has had a protracted resp exac- triggered by prob RLL pneumonia (NOS) superimposed on his severe COPD/bullous emphysema;  Rec to change the Albut for Neb to [place] & continue treatments Tid; continue other meds regularly as outlined;  He is referred to [facility] & hopes to start soon;  We will plan ROV in 44mo sooner if needed.  ~  June 20, 2016:  77mo ROV w/ SN>  Marv returns for a 77mo ROV & states that he is doing very well- no new complaints or concerns at this time, his PCP is DrKoirala; He started [facility] in June & continues in this program at present; Pt prev inquired about treatments at "[facility]" for regenerative lung tissue (stem cell therapy) which costs ~$10K per treatment & all benefits are antecdotal; I offered to refer him to [facility] [place] [facility] if he wants cutting edge research approach...     EPIC records indicate ER visit 05/08/16 for Abd Pain> VSS, exam was neg, CT Abd showed large fecal burden otherw neg, distended urinary bladder, atherosclerosis, bilat L5 pars defects; Rec to take laxatives...     Severe COPD- GOLD Stage3, bullous emphysema, s/p resection of RUL bleb in 2007> on Advair250Bid & Spiriva daily; he has NEB w/ Duoneb for prn use; Spirometry 09/2015 w/ FEV1=1.12 (37%); he is enrolled in [place] & wants a POC instead of tanks;     Chronic hypoxemic respiratory failure on O2 at 2L/min via POC> ambulatory O2sats 09/2015 dropped to 89% on 2L/min after 2 Laps.    Hx prob hypercarbic resp failure inferred from Pt report of BiPAP ([facility]) since 2007, never been on CPAP, never had sleep study, unknown ABGs or pCO2 data>    Hx cor pulmonale/ pulmonary hypertension on Revatio20Tid since 2007 w/o additional med trials or dose adjustments> we do not have any of the objective data from his prev  physician teams; 2DEcho here 09/2015 showed PAsys=36 and we tried to wean his Revatio but he refused due to "other reasons", finally compromised on Revatio20Bid...    Hx CAP- Hosp 12/2015 w/ RLL opac (nos) & responded to broad spectrum Ab coverage + Sulomedrol, O2, Nebs, etc; readmitted 01/2016 w/ COPD exac- similar Rx but sent to NH for rehab at disch...    Cardiac issues>  ?nonobstructive CAD, cor pulmonale w/ mild pulmHTN & 2DEcho 09/2015 showing norm LVF w/ EF=55-60%, norm wall motion, mild MR, mild RA dil, PAsys est 50mmHg...    Medical issues include:  HBP (on CardizemCD240), HL (on Cres10), hx thyroid nodule, GERD (on Nexium40), Constipation, BPH (on Proscar5 & Flomax0.4) w/ hx urinary retention, insomnia (on Restoril30)... EXAM shows Afeb, VSS, O2sat=93% on 2L/min pulse-dose;  Heent- neg, mallampati1;  Chest- decr BS at bases, can't augment BS voluntarily, w/o w/r/r;  Heart- RR w/o m/r/g;  Abd- soft, neg;  Ext- neg w/o c/c/e;  Neuro- intact...  CXR 05/08/16>  Mod bullous emphysema w/ chr changes at the lung bases w/ pleuroparenchymal scarring, surg suture lines ove the right mid lung...  LABS 04/2016 in epic> [place]- ok, BS=149;  CBC- anemia w/ Hg= 10.4-11.7, WBC=15K IMP/PLAN>>  Marv is stable on his baseline regimen + pulm rehab, etc; rec to continue current meds and exercise; he needs the 2017 Flu vaccine when avail & will call prn any breathing problems; we plan ROV in 32mo...     Past Medical History:  Diagnosis  Date  . Emphysema lung ([place])   . Hypertension   . Pulmonary hypertension ([facility])   Medical Hx>  HBP, ?nonobstructive CAD, HL, thyroid nodule, GERD, constipation, BPH, insomnia... Meds include>  CardizemCD240, Crestor10, Nexium20, Miralax, Proscar10, Restoril30...   Past Surgical History:  Procedure Laterality Date  . LUNG SURGERY    . TONSILLECTOMY    Hx Thoracotomy & RUL Bleb-ectomy 2007 in [place], [place]...   Outpatient Encounter Prescriptions as of 06/20/2016  Medication Sig   . ADVAIR DISKUS 250-50 MCG/DOSE AEPB Inhale 1 puff into the lungs 2 (two) times daily.  Marland Kitchen albuterol (PROVENTIL HFA;VENTOLIN HFA) 108 (90 Base) MCG/ACT inhaler Inhale 2 puffs into the lungs every 6 (six) hours as needed for wheezing or shortness of breath.  . esomeprazole (NEXIUM) 20 MG capsule Take 20 mg by mouth 2 (two) times daily before a meal.   . finasteride (PROSCAR) 5 MG tablet Take 5 mg by mouth at bedtime.   Marland Kitchen ipratropium-albuterol (DUONEB) 0.5-2.5 (3) MG/3ML SOLN Take 3 mLs by nebulization every 6 (six) hours as needed. Dx: J44.9 (Patient taking differently: Take 3 mLs by nebulization every 6 (six) hours as needed (shortness of breath/ wheezing). Dx: J44.9)  . Multiple Vitamins-Minerals (MULTIVITAMIN WITH MINERALS) tablet Take 1 tablet by mouth daily.  . polyethylene glycol (MIRALAX / GLYCOLAX) packet Take 17 g by mouth daily. (Patient taking differently: Take 17 g by mouth at bedtime. Mix in 8 oz liquid and drink)  . rosuvastatin (CRESTOR) 10 MG tablet Take 10 mg by mouth daily.  Marland Kitchen SPIRIVA HANDIHALER 18 MCG inhalation capsule INHALE THE CONTENTS OF 1 CAPSULE EVERY DAY (Patient taking differently: INHALE THE CONTENTS OF 1 CAPSULE EVERY DAY AT NOON)  . tamsulosin (FLOMAX) 0.4 MG CAPS capsule Take 1 capsule (0.4 mg total) by mouth daily after breakfast.  . temazepam (RESTORIL) 30 MG capsule Take 30 mg by mouth at bedtime.    No Known Allergies   Current Medications, Allergies, Past Medical History, Past Surgical History, Family History, and Social History were reviewed in [employer] record.   Review of Systems             All symptoms NEG except where BOLDED >>  Constitutional:  F/C/S, fatigue, anorexia, unexpected weight change. HEENT:  HA, visual changes, hearing loss, earache, nasal symptoms, sore throat, mouth sores, hoarseness. Resp:  cough, sputum, hemoptysis; SOB, tightness, wheezing. Cardio:  CP, palpit, DOE, orthopnea, edema. GI:  N/V/D/C, blood in  stool; reflux, abd pain, distention, gas. GU:  dysuria, freq, urgency, hematuria, flank pain, voiding difficulty. MS:  joint pain, swelling, tenderness, decr ROM; neck pain, back pain, etc. Neuro:  HA, tremors, seizures, dizziness, syncope, weakness, numbness, gait abn. Skin:  suspicious lesions or skin rash. Heme:  adenopathy, bruising, bleeding. Psyche:  confusion, agitation, sleep disturbance, hallucinations, anxiety, depression suicidal.   Objective:   Physical Exam       Vital Signs:  Reviewed...   General:  WD, WN, 75 y/o WM in NAD; alert & oriented; pleasant & cooperative... HEENT:  [place]/AT; Conjunctiva- pink, Sclera- nonicteric, EOM-wnl, PERRLA, EACs-clear, TMs-wnl; NOSE-clear; THROAT-clear & wnl.  Neck:  Supple w/ fair ROM; no JVD; normal carotid impulses w/o bruits; no thyromegaly +small nodule palpated; no lymphadenopathy.  Chest:  Overinflated, resonant percussion note, decr BS bilat & can't augment BS voluntarily, no w/r/r heard... Heart:  Regular Rhythm; norm S1 & S2 without murmurs, rubs, or gallops detected. Abdomen:  Soft & nontender- no guarding or rebound; normal bowel sounds; no  organomegaly or masses palpated. Ext:  decr ROM; without deformities or arthritic changes; no varicose veins, +venous insuffic, no edema;  Pulses intact w/o bruits. Neuro:  No focal neuro deficits; sensory testing normal; gait normal & balance OK. Derm:  No lesions noted; no rash etc. Lymph:  No cervical, supraclavicular, axillary, or inguinal adenopathy palpated.   Assessment:      IMP >>     Severe COPD- GOLD Stage3, bullous emphysema, s/p resection of RUL bleb in 2007> on Advair250Bid & Spiriva daily; he has NEB w/ Duoneb for prn use; Spirometry 09/2015 w/ FEV1=1.12 (37%); he is enrolled in [place] & wants a POC instead of tanks;     Chronic hypoxemic respiratory failure on O2 at 2L/min via POC> ambulatory O2sats 09/2015 dropped to 89% on 2L/min after 2 Laps.    Hx prob hypercarbic resp  failure inferred from Pt report of BiPAP ([facility]) since 2007, never been on CPAP, never had sleep study, unknown ABGs or pCO2 data>    Hx cor pulmonale/ pulmonary hypertension on Revatio20Tid since 2007 w/o additional med trials or dose adjustments> we do not have any of the objective data from his prev physician teams; 2DEcho here 09/2015 showed PAsys=36 and we tried to wean his Revatio but he refused due to "other reasons", finally compromised on Revatio20Bid...    Hx CAP- Hosp 12/2015 w/ RLL opac (nos) & responded to broad spectrum Ab coverage + Sulomedrol, O2, Nebs, etc; readmitted 01/2016 w/ COPD exac- similar Rx but sent to NH for rehab at disch...    Cardiac issues>  ?nonobstructive CAD, cor pulmonale w/ mild pulmHTN & 2DEcho 09/2015 showing norm LVF w/ EF=55-60%, norm wall motion, mild MR, mild RA dil, PAsys est 65mmHg...    Medical issues include:  HBP (on CardizemCD240), HL (on Cres10), hx thyroid nodule, GERD (on Nexium40), constipation, BPH (on Proscar5 & Flomax0.4) w/ hx urinary retention, insomnia (on Restoril30)...  PLAN >>  10/02/15>   Genie has a distinctly patchy history to go along w/ his severe airflow obstruction & bullous emphysema;  We really need his old objective data from 2007 when he was started on O2 & BiPAP after his RUL bleb reduction surg;  He will try to get names and numbers for [place]- in the meanwhile we will contact his last physician in [facility] for their more recent data as we establish out data base here in [place];  He is very concerned that he wants [place] to continue his current regimen which has served him well over the last 53yrs;  Continue Advair250Bid, Spiriva daily, Revatio20tid, O2 at 2L/min, and the BiPAP nightly as currently set...  11/01/15>  See prob list above- he is stable on this regimen but does not want to wean the Revatio further for "other" reasons; OK to continue current med regimen- advised regular exercise vs pulm rehab program; given ZPak for prn  use over the winter & he knows to call for any resp issues, incr dyspnea, etc... He remains on BiPAP but we do not have any data- no records received from prev pulm physicians, no notes from [place], no download data from his machine- we will again try to make contact w/ his DME company... 03/18/16>   Jonpaul has had a protracted resp [place] x2 this spring- triggered by prob RLL pneumonia (NOS) superimposed on his severe COPD/bullous emphysema;  Rec to change the Albut for Neb to [place] & continue treatments Tid; continue other meds regularly as outlined;  He  is referred to [facility] & hopes to start soon;  We will plan ROV in 50mo sooner if needed. 06/20/16>   Marv is stable on his baseline regimen + pulm rehab, etc; rec to continue current meds and exercise; he needs the 2017 Flu vaccine when avail & will call prn any breathing problems; we plan ROV in 5mo...      Plan:     Patient's Medications  New Prescriptions   No medications on file  Previous Medications   ADVAIR DISKUS 250-50 MCG/DOSE AEPB    Inhale 1 puff into the lungs 2 (two) times daily.   ALBUTEROL (PROVENTIL HFA;VENTOLIN HFA) 108 (90 BASE) MCG/ACT INHALER    Inhale 2 puffs into the lungs every 6 (six) hours as needed for wheezing or shortness of breath.   ASCORBIC ACID (VITAMIN C PO)    Take 500 mg by mouth daily.    CALCIUM PO    Take 500 mg by mouth daily.    DILTIAZEM (TIAZAC) 240 MG 24 HR CAPSULE    Take 240 mg by mouth daily.   ESOMEPRAZOLE (NEXIUM) 20 MG CAPSULE    Take 20 mg by mouth 2 (two) times daily before a meal.    FINASTERIDE (PROSCAR) 5 MG TABLET    Take 5 mg by mouth daily.    IPRATROPIUM-ALBUTEROL (DUONEB) 0.5-2.5 (3) MG/3ML SOLN    Take 3 mLs by nebulization every 6 (six) hours as needed. Dx: J44.9   MULTIPLE VITAMINS-MINERALS (MULTIVITAMIN WITH MINERALS) TABLET    Take 1 tablet by mouth daily.   POLYETHYLENE GLYCOL (MIRALAX / GLYCOLAX) PACKET    Take 17 g by mouth daily.   ROSUVASTATIN (CRESTOR) 10 MG TABLET     Take 10 mg by mouth daily.   SILDENAFIL (REVATIO) 20 MG TABLET    Take 20 mg by mouth 2 (two) times daily.    SPIRIVA HANDIHALER 18 MCG INHALATION CAPSULE    INHALE THE CONTENTS OF 1 CAPSULE EVERY DAY   TAMSULOSIN (FLOMAX) 0.4 MG CAPS CAPSULE    Take 1 capsule (0.4 mg total) by mouth daily after breakfast.   TEMAZEPAM (RESTORIL) 30 MG CAPSULE    Take 30 mg by mouth at bedtime.   VITAMIN E 1000 UNIT CAPSULE    Take 1,000 Units by mouth daily.  Modified Medications   No medications on file  Discontinued Medications   SODIUM PHOSPHATE (FLEET) 7-19 GM/118ML ENEM    Place 133 mLs (1 enema total) rectally daily as needed for moderate constipation or severe constipation.

## 2016-06-25 ENCOUNTER — Encounter (HOSPITAL_COMMUNITY)
Admission: RE | Admit: 2016-06-25 | Discharge: 2016-06-25 | Disposition: A | Discharge status: Home / Self Care | Payer: Medicare Other | Source: Ambulatory Visit | Attending: Family Medicine | Admitting: Family Medicine

## 2016-06-25 VITALS — Wt 136.0 lb

## 2016-06-25 DIAGNOSIS — Z87891 Personal history of nicotine dependence: Secondary | ICD-10-CM | POA: Diagnosis not present

## 2016-06-25 DIAGNOSIS — J439 Emphysema, unspecified: Secondary | ICD-10-CM

## 2016-06-25 DIAGNOSIS — Z79899 Other long term (current) drug therapy: Secondary | ICD-10-CM | POA: Diagnosis not present

## 2016-06-25 DIAGNOSIS — I272 Other secondary pulmonary hypertension: Secondary | ICD-10-CM | POA: Diagnosis not present

## 2016-06-25 NOTE — Progress Notes (Signed)
Daily Session Note  Patient Details  Name: Andrew [name] MRN: [id number] Date of Birth: 22-Mar-1941 Referring Provider:   [name] [facility] Walk Test from 05/02/2016 in [place]  Referring Provider  Dr. Dorthy Cooler      Encounter Date: 06/25/2016  Check In:     Session Check In - 06/25/16 1016      Check-In   Location [facility]   Staff Present Rosebud Poles, RN, BSN;Ramon Dredge, RN, MHA;Lisa Ysidro Evert, RN;Keisuke Hollabaugh, MS, ACSM RCEP, Exercise Physiologist   Supervising physician immediately available to respond to emergencies [facility] immediately available   Physician(s) Dr. Marily Memos   Medication changes reported     No   Fall or balance concerns reported    No   Warm-up and Cool-down Performed as group-led instruction   Resistance Training Performed Yes   VAD Patient? No     Pain Assessment   Currently in Pain? No/denies   Multiple Pain Sites No      Capillary Blood Glucose: No results found for this or any previous visit (from the past 24 hour(s)).      Exercise Prescription Changes - 06/25/16 1200      Response to Exercise   Blood Pressure (Admit) 98/62   Blood Pressure (Exercise) 120/50   Blood Pressure (Exit) 100/58   Heart Rate (Admit) 66 bpm   Heart Rate (Exercise) 101 bpm   Heart Rate (Exit) 76 bpm   Oxygen Saturation (Admit) 98 %   Oxygen Saturation (Exercise) 96 %   Oxygen Saturation (Exit) 100 %   Rating of Perceived Exertion (Exercise) 11   Perceived Dyspnea (Exercise) 1   Duration Progress to 45 minutes of aerobic exercise without signs/symptoms of physical distress   Intensity THRR unchanged     Progression   Progression Continue to progress workloads to maintain intensity without signs/symptoms of physical distress.     Resistance Training   Training Prescription Yes   Weight orange bands   Reps 10-12  10 minutes of strength training     Interval Training   Interval Training No     Oxygen   Oxygen Continuous   Liters 2     Bike   Level 0.4   Minutes 17     NuStep   Level 4   Minutes 17   METs 2.3     Track   Laps 14   Minutes 17     Goals Met:  Exercise tolerated well No report of cardiac concerns or symptoms Strength training completed today  Goals Unmet:  Not Applicable  Comments: Service time is from 10:30am to 12:00noon    Dr. Rush Farmer is Medical Director for Pulmonary Rehab at [facility].

## 2016-06-27 ENCOUNTER — Encounter (HOSPITAL_COMMUNITY)
Admission: RE | Admit: 2016-06-27 | Discharge: 2016-06-27 | Disposition: A | Discharge status: Home / Self Care | Payer: Medicare Other | Source: Ambulatory Visit | Attending: Family Medicine | Admitting: Family Medicine

## 2016-06-27 VITALS — Wt 136.0 lb

## 2016-06-27 DIAGNOSIS — J439 Emphysema, unspecified: Secondary | ICD-10-CM

## 2016-06-27 NOTE — Progress Notes (Signed)
Daily Session Note  Patient Details  Name: Andrew [name] MRN: [id number] Date of Birth: 10/18/1941 Referring Provider:   [name] [facility]monary Rehab Walk Test from 05/02/2016 in [place]  Referring Provider  Dr. Dorthy Cooler      Encounter Date: 06/27/2016  Check In:     Session Check In - 06/27/16 1018      Check-In   Location [facility]   Staff Present Rosebud Poles, RN, BSN;Molly diVincenzo, MS, ACSM RCEP, Exercise Physiologist;Deforrest Bogle Ysidro Evert, RN;Other   Supervising physician immediately available to respond to emergencies [facility] immediately available   Physician(s) Dr. Marthenia Rolling   Medication changes reported     No   Fall or balance concerns reported    No   Warm-up and Cool-down Performed as group-led instruction   Resistance Training Performed Yes   VAD Patient? No     Pain Assessment   Currently in Pain? No/denies   Multiple Pain Sites No      Capillary Blood Glucose: No results found for this or any previous visit (from the past 24 hour(s)).      Exercise Prescription Changes - 06/27/16 1200      Response to Exercise   Blood Pressure (Admit) 116/60   Blood Pressure (Exercise) 140/66   Blood Pressure (Exit) 90/50  gave gatoraide recheck BP 108/60   Heart Rate (Admit) 77 bpm   Heart Rate (Exercise) 115 bpm   Heart Rate (Exit) 71 bpm   Oxygen Saturation (Admit) 98 %   Oxygen Saturation (Exercise) 93 %   Oxygen Saturation (Exit) 96 %   Rating of Perceived Exertion (Exercise) 11   Perceived Dyspnea (Exercise) 2   Duration Progress to 45 minutes of aerobic exercise without signs/symptoms of physical distress   Intensity THRR unchanged     Progression   Progression Continue to progress workloads to maintain intensity without signs/symptoms of physical distress.     Resistance Training   Training Prescription Yes   Weight orange bands   Reps 10-12  10 minutes of strength training     Interval Training    Interval Training No     Oxygen   Oxygen Continuous   Liters 2     NuStep   Level 4   Minutes 17   METs 2.4     Track   Laps 15   Minutes 17     Goals Met:  Exercise tolerated well Queuing for purse lip breathing Strength training completed today  Goals Unmet:  Not Applicable  Comments: Service time is from 1030 to 1240    Dr. Rush Farmer is Medical Director for Pulmonary Rehab at [facility].

## 2016-06-28 ENCOUNTER — Inpatient Hospital Stay (HOSPITAL_COMMUNITY)
Admission: EM | Admit: 2016-06-28 | Discharge: 2016-07-01 | DRG: 871 | Disposition: A | Discharge status: Home / Self Care | Payer: Medicare Other | Attending: Internal Medicine | Admitting: Internal Medicine

## 2016-06-28 ENCOUNTER — Encounter (HOSPITAL_COMMUNITY): Payer: Self-pay

## 2016-06-28 ENCOUNTER — Emergency Department (HOSPITAL_COMMUNITY): Payer: Medicare Other

## 2016-06-28 DIAGNOSIS — D649 Anemia, unspecified: Secondary | ICD-10-CM | POA: Diagnosis present

## 2016-06-28 DIAGNOSIS — I272 Other secondary pulmonary hypertension: Secondary | ICD-10-CM | POA: Diagnosis present

## 2016-06-28 DIAGNOSIS — Z7951 Long term (current) use of inhaled steroids: Secondary | ICD-10-CM

## 2016-06-28 DIAGNOSIS — R0602 Shortness of breath: Secondary | ICD-10-CM | POA: Diagnosis not present

## 2016-06-28 DIAGNOSIS — G4733 Obstructive sleep apnea (adult) (pediatric): Secondary | ICD-10-CM | POA: Diagnosis present

## 2016-06-28 DIAGNOSIS — Z808 Family history of malignant neoplasm of other organs or systems: Secondary | ICD-10-CM

## 2016-06-28 DIAGNOSIS — Z9981 Dependence on supplemental oxygen: Secondary | ICD-10-CM

## 2016-06-28 DIAGNOSIS — Z87891 Personal history of nicotine dependence: Secondary | ICD-10-CM

## 2016-06-28 DIAGNOSIS — J441 Chronic obstructive pulmonary disease with (acute) exacerbation: Secondary | ICD-10-CM | POA: Diagnosis not present

## 2016-06-28 DIAGNOSIS — J440 Chronic obstructive pulmonary disease with acute lower respiratory infection: Secondary | ICD-10-CM | POA: Diagnosis present

## 2016-06-28 DIAGNOSIS — I10 Essential (primary) hypertension: Secondary | ICD-10-CM | POA: Diagnosis present

## 2016-06-28 DIAGNOSIS — K219 Gastro-esophageal reflux disease without esophagitis: Secondary | ICD-10-CM | POA: Diagnosis not present

## 2016-06-28 DIAGNOSIS — J189 Pneumonia, unspecified organism: Secondary | ICD-10-CM | POA: Diagnosis present

## 2016-06-28 DIAGNOSIS — L89329 Pressure ulcer of left buttock, unspecified stage: Secondary | ICD-10-CM | POA: Diagnosis present

## 2016-06-28 DIAGNOSIS — A419 Sepsis, unspecified organism: Secondary | ICD-10-CM | POA: Diagnosis not present

## 2016-06-28 DIAGNOSIS — Z79899 Other long term (current) drug therapy: Secondary | ICD-10-CM | POA: Diagnosis not present

## 2016-06-28 DIAGNOSIS — R509 Fever, unspecified: Secondary | ICD-10-CM | POA: Diagnosis not present

## 2016-06-28 DIAGNOSIS — J9621 Acute and chronic respiratory failure with hypoxia: Secondary | ICD-10-CM | POA: Diagnosis not present

## 2016-06-28 DIAGNOSIS — G4700 Insomnia, unspecified: Secondary | ICD-10-CM | POA: Diagnosis present

## 2016-06-28 DIAGNOSIS — L89309 Pressure ulcer of unspecified buttock, unspecified stage: Secondary | ICD-10-CM | POA: Diagnosis present

## 2016-06-28 LAB — COMPREHENSIVE METABOLIC PANEL
ALK PHOS: 74 U/L (ref 38–126)
ALT: 22 U/L (ref 17–63)
AST: 21 U/L (ref 15–41)
Albumin: 4 g/dL (ref 3.5–5.0)
Anion gap: 8 (ref 5–15)
BUN: 14 mg/dL (ref 6–20)
CALCIUM: 8.9 mg/dL (ref 8.9–10.3)
CHLORIDE: 105 mmol/L (ref 101–111)
CO2: 26 mmol/L (ref 22–32)
CREATININE: 1.02 mg/dL (ref 0.61–1.24)
GFR calc Af Amer: >60 mL/min (ref >60)
GFR calc non Af Amer: >60 mL/min (ref >60)
Glucose, Bld: 121 mg/dL — ABNORMAL HIGH (ref 65–99)
Potassium: 3.9 mmol/L (ref 3.5–5.1)
SODIUM: 139 mmol/L (ref 135–145)
Total Bilirubin: 0.3 mg/dL (ref 0.3–1.2)
Total Protein: 6.7 g/dL (ref 6.5–8.1)

## 2016-06-28 LAB — URINALYSIS, ROUTINE W REFLEX MICROSCOPIC
BILIRUBIN URINE: NEGATIVE
Glucose, UA: NEGATIVE mg/dL
HGB URINE DIPSTICK: NEGATIVE
Ketones, ur: 15 mg/dL — AB
Leukocytes, UA: NEGATIVE
Nitrite: NEGATIVE
PROTEIN: NEGATIVE mg/dL
Specific Gravity, Urine: 1.017 (ref 1.005–1.030)
pH: 7.5 (ref 5.0–8.0)

## 2016-06-28 LAB — CBC WITH DIFFERENTIAL/PLATELET
BASOS ABS: 0.2 10*3/uL — AB (ref 0.0–0.1)
Basophils Relative: 1 %
EOS ABS: 0 10*3/uL (ref 0.0–0.7)
EOS PCT: 0 %
HCT: 35.1 % — ABNORMAL LOW (ref 39.0–52.0)
HEMOGLOBIN: 11.3 g/dL — AB (ref 13.0–17.0)
LYMPHS ABS: 0.8 10*3/uL (ref 0.7–4.0)
LYMPHS PCT: 5 %
MCH: 28.4 pg (ref 26.0–34.0)
MCHC: 32.2 g/dL (ref 30.0–36.0)
MCV: 88.2 fL (ref 78.0–100.0)
Monocytes Absolute: 0.9 10*3/uL (ref 0.1–1.0)
Monocytes Relative: 6 %
NEUTROS PCT: 88 %
Neutro Abs: 13.3 10*3/uL — ABNORMAL HIGH (ref 1.7–7.7)
PLATELETS: 242 10*3/uL (ref 150–400)
RBC: 3.98 MIL/uL — AB (ref 4.22–5.81)
RDW: 15.4 % (ref 11.5–15.5)
WBC: 15.1 10*3/uL — AB (ref 4.0–10.5)

## 2016-06-28 LAB — BRAIN NATRIURETIC PEPTIDE: B Natriuretic Peptide: 24.3 pg/mL (ref 0.0–100.0)

## 2016-06-28 LAB — I-STAT ARTERIAL BLOOD GAS, ED
BICARBONATE: 24.1 meq/L — AB (ref 20.0–24.0)
O2 Saturation: 94 %
PCO2 ART: 35.2 mmHg (ref 35.0–45.0)
PO2 ART: 69 mmHg — AB (ref 80.0–100.0)
Patient temperature: 98.6
TCO2: 25 mmol/L (ref 0–100)
pH, Arterial: 7.444 (ref 7.350–7.450)

## 2016-06-28 LAB — D-DIMER, QUANTITATIVE: D-Dimer, Quant: 0.4 ug/mL-FEU (ref 0.00–0.50)

## 2016-06-28 LAB — I-STAT CG4 LACTIC ACID, ED
LACTIC ACID, VENOUS: 1.29 mmol/L (ref 0.5–1.9)
LACTIC ACID, VENOUS: 0.58 mmol/L (ref 0.5–1.9)

## 2016-06-28 LAB — TROPONIN I: Troponin I: <0.03 ng/mL (ref <0.03)

## 2016-06-28 MED ORDER — METHYLPREDNISOLONE SODIUM SUCC 125 MG IJ SOLR
60.0000 mg | Freq: Four times a day (QID) | INTRAMUSCULAR | Status: DC
  Administered 2016-06-29 – 2016-07-01 (×9): 60 mg via INTRAVENOUS
  Filled 2016-06-28 (×10): qty 2

## 2016-06-28 MED ORDER — DEXTROSE 5 % IV SOLN
1.0000 g | INTRAVENOUS | Status: DC
  Administered 2016-06-28 – 2016-07-01 (×3): 1 g via INTRAVENOUS
  Filled 2016-06-28 (×3): qty 10

## 2016-06-28 MED ORDER — SODIUM CHLORIDE 0.9 % IV SOLN
INTRAVENOUS | Status: AC
  Administered 2016-06-28: 22:00:00 via INTRAVENOUS

## 2016-06-28 MED ORDER — IPRATROPIUM-ALBUTEROL 0.5-2.5 (3) MG/3ML IN SOLN
3.0000 mL | RESPIRATORY_TRACT | Status: DC | PRN
  Administered 2016-06-28 – 2016-06-30 (×4): 3 mL via RESPIRATORY_TRACT
  Filled 2016-06-28 (×4): qty 3

## 2016-06-28 MED ORDER — DEXTROSE 5 % IV SOLN: 500.0000 mg | Freq: Once | INTRAVENOUS | Status: DC

## 2016-06-28 MED ORDER — VANCOMYCIN HCL IN DEXTROSE 1-5 GM/200ML-% IV SOLN: 1000.0000 mg | Freq: Once | INTRAVENOUS | Status: DC

## 2016-06-28 MED ORDER — SODIUM CHLORIDE 0.9 % IV BOLUS (SEPSIS)
1000.0000 mL | Freq: Once | INTRAVENOUS | Status: AC
  Administered 2016-06-28: 1000 mL via INTRAVENOUS

## 2016-06-28 MED ORDER — FINASTERIDE 5 MG PO TABS
5.0000 mg | ORAL_TABLET | Freq: Every day | ORAL | Status: DC
  Administered 2016-06-28 – 2016-07-01 (×3): 5 mg via ORAL
  Filled 2016-06-28 (×3): qty 1

## 2016-06-28 MED ORDER — TAMSULOSIN HCL 0.4 MG PO CAPS
0.4000 mg | ORAL_CAPSULE | Freq: Every day | ORAL | Status: DC
  Administered 2016-06-29 – 2016-07-01 (×3): 0.4 mg via ORAL
  Filled 2016-06-28 (×3): qty 1

## 2016-06-28 MED ORDER — VANCOMYCIN HCL 10 G IV SOLR
1500.0000 mg | Freq: Once | INTRAVENOUS | Status: AC
  Administered 2016-06-28: 1500 mg via INTRAVENOUS
  Filled 2016-06-28: qty 1500

## 2016-06-28 MED ORDER — MOMETASONE FURO-FORMOTEROL FUM 200-5 MCG/ACT IN AERO
2.0000 | INHALATION_SPRAY | Freq: Two times a day (BID) | RESPIRATORY_TRACT | Status: DC
  Administered 2016-06-29 – 2016-07-01 (×5): 2 via RESPIRATORY_TRACT
  Filled 2016-06-28: qty 8.8

## 2016-06-28 MED ORDER — PANTOPRAZOLE SODIUM 40 MG PO TBEC
40.0000 mg | DELAYED_RELEASE_TABLET | Freq: Every day | ORAL | Status: DC
  Administered 2016-06-29 – 2016-07-01 (×3): 40 mg via ORAL
  Filled 2016-06-28 (×3): qty 1

## 2016-06-28 MED ORDER — DEXTROSE 5 % IV SOLN: 1.0000 g | Freq: Once | INTRAVENOUS | Status: DC

## 2016-06-28 MED ORDER — PIPERACILLIN-TAZOBACTAM 3.375 G IVPB 30 MIN
3.3750 g | Freq: Once | INTRAVENOUS | Status: AC
  Administered 2016-06-28: 3.375 g via INTRAVENOUS
  Filled 2016-06-28: qty 50

## 2016-06-28 MED ORDER — TIOTROPIUM BROMIDE MONOHYDRATE 18 MCG IN CAPS
18.0000 ug | ORAL_CAPSULE | Freq: Every day | RESPIRATORY_TRACT | Status: DC
  Administered 2016-06-29 – 2016-07-01 (×3): 18 ug via RESPIRATORY_TRACT
  Filled 2016-06-28: qty 5

## 2016-06-28 MED ORDER — SODIUM CHLORIDE 0.9 % IV BOLUS (SEPSIS): 1000.0000 mL | Freq: Once | INTRAVENOUS | Status: DC

## 2016-06-28 MED ORDER — METHYLPREDNISOLONE SODIUM SUCC 125 MG IJ SOLR
125.0000 mg | Freq: Once | INTRAMUSCULAR | Status: AC
  Administered 2016-06-28: 125 mg via INTRAVENOUS
  Filled 2016-06-28: qty 2

## 2016-06-28 MED ORDER — ROSUVASTATIN CALCIUM 10 MG PO TABS
10.0000 mg | ORAL_TABLET | Freq: Every day | ORAL | Status: DC
  Administered 2016-06-29 – 2016-07-01 (×3): 10 mg via ORAL
  Filled 2016-06-28 (×4): qty 1

## 2016-06-28 MED ORDER — SODIUM CHLORIDE 0.9 % IV SOLN
INTRAVENOUS | Status: DC
  Administered 2016-06-28: 21:00:00 via INTRAVENOUS

## 2016-06-28 MED ORDER — TEMAZEPAM 15 MG PO CAPS
30.0000 mg | ORAL_CAPSULE | Freq: Every day | ORAL | Status: DC
  Administered 2016-06-28 – 2016-07-01 (×3): 30 mg via ORAL
  Filled 2016-06-28 (×4): qty 2

## 2016-06-28 MED ORDER — DILTIAZEM HCL ER COATED BEADS 120 MG PO CP24
240.0000 mg | ORAL_CAPSULE | Freq: Every day | ORAL | Status: DC
  Administered 2016-06-28 – 2016-07-01 (×3): 240 mg via ORAL
  Filled 2016-06-28 (×3): qty 2

## 2016-06-28 MED ORDER — PIPERACILLIN-TAZOBACTAM 3.375 G IVPB
3.3750 g | Freq: Three times a day (TID) | INTRAVENOUS | Status: DC
  Filled 2016-06-28: qty 50

## 2016-06-28 MED ORDER — POLYETHYLENE GLYCOL 3350 17 G PO PACK
17.0000 g | PACK | Freq: Every day | ORAL | Status: DC
  Administered 2016-06-28 – 2016-07-01 (×3): 17 g via ORAL
  Filled 2016-06-28 (×3): qty 1

## 2016-06-28 MED ORDER — IPRATROPIUM-ALBUTEROL 0.5-2.5 (3) MG/3ML IN SOLN
3.0000 mL | Freq: Once | RESPIRATORY_TRACT | Status: AC
  Administered 2016-06-28: 3 mL via RESPIRATORY_TRACT
  Filled 2016-06-28: qty 3

## 2016-06-28 MED ORDER — ENOXAPARIN SODIUM 40 MG/0.4ML ~~LOC~~ SOLN
40.0000 mg | SUBCUTANEOUS | Status: DC
  Administered 2016-06-28 – 2016-07-01 (×3): 40 mg via SUBCUTANEOUS
  Filled 2016-06-28 (×3): qty 0.4

## 2016-06-28 MED ORDER — DEXTROSE 5 % IV SOLN
500.0000 mg | INTRAVENOUS | Status: DC
  Administered 2016-06-28 – 2016-07-01 (×3): 500 mg via INTRAVENOUS
  Filled 2016-06-28 (×3): qty 500

## 2016-06-28 MED ORDER — VANCOMYCIN HCL 500 MG IV SOLR
500.0000 mg | Freq: Two times a day (BID) | INTRAVENOUS | Status: DC
  Filled 2016-06-28: qty 500

## 2016-06-28 MED ORDER — ACETAMINOPHEN 325 MG PO TABS
650.0000 mg | ORAL_TABLET | Freq: Once | ORAL | Status: AC
  Administered 2016-06-28: 650 mg via ORAL
  Filled 2016-06-28: qty 2

## 2016-06-28 NOTE — ED Provider Notes (Signed)
[facility] DEPT Provider Note   CSN: [id number] Arrival date & time: 06/28/16  1729  First Provider Contact:  First MD Initiated Contact with Patient 06/28/16 1733        History   Chief Complaint Chief Complaint  Patient presents with  . Shortness of Breath    HPI Andrew [name] is a 75 y.o. male.  Patient presents from home by EMS with acute onset of shortness of breath this evening. States he went for a walk which he usually does and became more dyspneic with exertion and desaturated into the 70s. He normally wears 2 L at all times. He denies any chest pain is felt chills and subjective fever without checked his temperature. His cough is not productive. Denies any leg pain or leg swelling. Denies abdominal pain, nausea or vomiting. Denies any history of heart attack stents in his heart. He has a history of COPD and sleep apnea on BiPAP at night. Also with history of pulmonary hypertension. He's been taking his Advair and Symbicort but has not used his rescue inhaler lately.   The history is provided by the patient and the EMS personnel. The history is limited by the condition of the patient.  Shortness of Breath  Associated symptoms include a fever and cough. Pertinent negatives include no chest pain, no vomiting, no abdominal pain, no rash and no leg swelling.    Past Medical History:  Diagnosis Date  . 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 Hypertension   . Pulmonary hypertension [facility])     Patient Active Problem List   Diagnosis Date Noted  . Pulmonary hypertension ([place]) 02/20/2016  . OSA (obstructive sleep apnea) 02/20/2016  . GERD (gastroesophageal reflux disease) 02/20/2016  . COPD exacerbation ([place]) 02/13/2016  . Acute respiratory failure with hypoxia ([place]) 02/12/2016  . Leukocytosis 02/11/2016  . Hypertension 02/11/2016  . Hyperlipidemia 02/11/2016  . CAP (community acquired pneumonia)   . BPH (benign prostatic hypertrophy) with urinary retention 12/29/2015  . Sepsis  ([place]) 12/28/2015  . Acute on chronic respiratory failure with hypoxia ([place]) 12/28/2015  . 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 History of pulmonary hypertension 10/02/2015    Past Surgical History:  Procedure Laterality Date  . LUNG SURGERY    . TONSILLECTOMY         Home Medications    Prior to Admission medications   Medication Sig Start Date End Date Taking? Authorizing Provider  ADVAIR DISKUS 250-50 MCG/DOSE AEPB Inhale 1 puff into the lungs 2 (two) times daily. 11/01/15   Noralee Space, MD  albuterol (PROVENTIL HFA;VENTOLIN HFA) 108 (90 Base) MCG/ACT inhaler Inhale 2 puffs into the lungs every 6 (six) hours as needed for wheezing or shortness of breath. 01/03/16   Nita Sells, MD  Ascorbic Acid (VITAMIN C PO) Take 500 mg by mouth daily.     Historical Provider, MD  CALCIUM PO Take 500 mg by mouth daily.     Historical Provider, MD  diltiazem (TIAZAC) 240 MG 24 hr capsule Take 240 mg by mouth daily.    Historical Provider, MD  esomeprazole (NEXIUM) 20 MG capsule Take 20 mg by mouth 2 (two) times daily before a meal.     Historical Provider, MD  finasteride (PROSCAR) 5 MG tablet Take 5 mg by mouth daily.     Historical Provider, MD  ipratropium-albuterol (DUONEB) 0.5-2.5 (3) MG/3ML SOLN Take 3 mLs by nebulization every 6 (six) hours as needed. Dx: [id number] Patient  taking differently: Take 3 mLs by nebulization 3 (three) times daily. Dx: [id number] 03/18/16   Noralee Space, MD  Multiple Vitamins-Minerals (MULTIVITAMIN WITH MINERALS) tablet Take 1 tablet by mouth daily.    Historical Provider, MD  polyethylene glycol (MIRALAX / GLYCOLAX) packet Take 17 g by mouth daily. 05/08/16   Merrily Pew, MD  rosuvastatin (CRESTOR) 10 MG tablet Take 10 mg by mouth daily.    Historical Provider, MD  sildenafil (REVATIO) 20 MG tablet Take 20 mg by mouth 2 (two) times daily.     Historical Provider, MD  SPIRIVA HANDIHALER 18 MCG inhalation  capsule INHALE THE CONTENTS OF 1 CAPSULE EVERY DAY 04/23/16   Noralee Space, MD  tamsulosin (FLOMAX) 0.4 MG CAPS capsule Take 1 capsule (0.4 mg total) by mouth daily after breakfast. 01/03/16   Nita Sells, MD  temazepam (RESTORIL) 30 MG capsule Take 30 mg by mouth at bedtime.    Historical Provider, MD  vitamin E 1000 UNIT capsule Take 1,000 Units by mouth daily.    Historical Provider, MD    Family History Family History  Problem Relation Age of Onset  . Bone cancer Maternal Uncle     Social History Social History  Substance Use Topics  . Smoking status: Former Smoker    Packs/day: 0.00    Years: 0.00    Types: Cigarettes    Quit date: 10/01/1984  . Smokeless tobacco: Never Used  . Alcohol use No     Allergies   Review of patient's allergies indicates no known allergies.   Review of Systems Review of Systems  Constitutional: Positive for activity change, appetite change, chills, fatigue and fever.  HENT: Positive for congestion.   Eyes: Negative for visual disturbance.  Respiratory: Positive for cough and shortness of breath. Negative for chest tightness.   Cardiovascular: Negative for chest pain and leg swelling.  Gastrointestinal: Negative for abdominal pain, nausea and vomiting.  Genitourinary: Negative for dysuria, testicular pain and urgency.  Musculoskeletal: Positive for arthralgias and myalgias.  Skin: Negative for rash.  Neurological: Positive for weakness. Negative for dizziness.  A complete 10 system review of systems was obtained and all systems are negative except as noted in the HPI and PMH.     Physical Exam Updated Vital Signs BP 161/77   Pulse 118   Temp 100.3 F (37.9 C)   Resp 20   SpO2 97%   Physical Exam  Constitutional: He is oriented to person, place, and time. He appears well-developed and well-nourished. He appears distressed.  Shaking chills  HENT:  Head: Normocephalic and atraumatic.  Mouth/Throat: No oropharyngeal exudate.    Eyes: Conjunctivae and EOM are normal. Pupils are equal, round, and reactive to light.  Neck: Normal range of motion. Neck supple.  Cardiovascular: Normal rate, regular rhythm and normal heart sounds.   tachycardic  Pulmonary/Chest: He is in respiratory distress. He has wheezes.  Decreased throughout with scattered expiratory wheezing  Abdominal: Soft. He exhibits no mass. There is no tenderness. There is no guarding.  Musculoskeletal: Normal range of motion. He exhibits tenderness. He exhibits no edema.  Neurological: He is alert and oriented to person, place, and time. He has normal reflexes. No cranial nerve deficit.  Skin: Skin is warm.     ED Treatments / Results  Labs (all labs ordered are listed, but only abnormal results are displayed) Labs Reviewed  CBC WITH DIFFERENTIAL/PLATELET - Abnormal; Notable for the following:       Result Value  WBC 15.1 (*)    RBC 3.98 (*)    Hemoglobin 11.3 (*)    HCT 35.1 (*)    Neutro Abs 13.3 (*)    Basophils Absolute 0.2 (*)    All other components within normal limits  COMPREHENSIVE METABOLIC PANEL - Abnormal; Notable for the following:    Glucose, Bld 121 (*)    All other components within normal limits  URINALYSIS, ROUTINE W REFLEX MICROSCOPIC (NOT AT [facility]) - Abnormal; Notable for the following:    Ketones, ur 15 (*)    All other components within normal limits  I-STAT ARTERIAL BLOOD GAS, ED - Abnormal; Notable for the following:    pO2, Arterial 69.0 (*)    Bicarbonate 24.1 (*)    All other components within normal limits  CULTURE, BLOOD (ROUTINE X 2)  CULTURE, BLOOD (ROUTINE X 2)  URINE CULTURE  CULTURE, EXPECTORATED SPUTUM-ASSESSMENT  GRAM STAIN  BRAIN NATRIURETIC PEPTIDE  TROPONIN I  D-DIMER, QUANTITATIVE (NOT AT [facility])  HIV ANTIBODY (ROUTINE TESTING)  STREP PNEUMONIAE URINARY ANTIGEN  LEGIONELLA PNEUMOPHILA SEROGP 1 UR AG  BASIC METABOLIC PANEL  I-STAT CG4 LACTIC ACID, ED  I-STAT CG4 LACTIC ACID, ED    EKG  EKG  Interpretation  Date/Time:  Friday June 28 2016 17:57:17 EDT Ventricular Rate:  117 PR Interval:    QRS Duration: 96 QT Interval:  295 QTC Calculation: 412 R Axis:   -27 Text Interpretation:  Sinus tachycardia Borderline left axis deviation RSR' in V1 or V2, right VCD or RVH Rate faster worsening ST depressions Confirmed by Wyvonnia Dusky  MD, Matilynn Dacey [phone]) on 06/28/2016 6:16:32 PM       Radiology Dg Chest Portable 1 View  Result Date: 06/28/2016 CLINICAL DATA:  Shortness of breath since 3 p.m. today. Hard to breathe with or without oxygen. EXAM: PORTABLE CHEST 1 VIEW COMPARISON:  05/08/2016 FINDINGS: Lungs are hyperinflated. Marked emphysematous changes are identified at the apices. Crowded lung markings are identified at the bases. No focal consolidations or pleural effusions. IMPRESSION: Hyperinflation/emphysema. No focal acute pulmonary abnormality. Electronically Signed   By: Nolon Nations M.D.   On: 06/28/2016 17:56    Procedures Procedures (including critical care time)  Medications Ordered in ED Medications  ipratropium-albuterol (DUONEB) 0.5-2.5 (3) MG/3ML nebulizer solution 3 mL (not administered)  sodium chloride 0.9 % bolus 1,000 mL (not administered)     Initial Impression / Assessment and Plan / ED Course  I have reviewed the triage vital signs and the nursing notes.  Pertinent labs & imaging results that were available during my care of the patient were reviewed by me and considered in my medical decision making (see chart for details).  Clinical Course  COPD patient home oxygen presenting with acute onset of shaking chills, shortness of breath and hypoxia. Tachycardic and febrile on arrival. O2 saturation 97% on 2 L.  Nebulizer given. Code sepsis activated, temp 103 with tachycardia to 120s.  IVF, broad spectrum antibiotics given after cultures obtained.  CXR without obvious infiltrate but still suspect pneumonia. ABG with low PO2, no CO2 retention.  D-dimer  negative, doubt PE.  Improving with fluids and antipyretics in ED. Antibiotics given. Admission for sepsis and presumed CAP d/w Dr. Myna Hidalgo.   CRITICAL CARE Performed by: Ezequiel Essex Total critical care time: 35 minutes Critical care time was exclusive of separately billable procedures and treating other patients. Critical care was necessary to treat or prevent imminent or life-threatening deterioration. Critical care was time spent personally by me on the  following activities: development of treatment plan with patient and/or surrogate as well as nursing, discussions with consultants, evaluation of patient's response to treatment, examination of patient, obtaining history from patient or surrogate, ordering and performing treatments and interventions, ordering and review of laboratory studies, ordering and review of radiographic studies, pulse oximetry and re-evaluation of patient's condition.  Final Clinical Impressions(s) / ED Diagnoses   Final diagnoses:  Sepsis, due to unspecified organism ([place])  CAP (community acquired pneumonia)    New Prescriptions New Prescriptions   No medications on file     Ezequiel Essex, MD 06/29/16 0105

## 2016-06-28 NOTE — ED Notes (Signed)
ACTIVATED CODE SEPSIS WITH CARELINK @ 18:20

## 2016-06-28 NOTE — Progress Notes (Signed)
Pharmacy Antibiotic Note  Andrew [name] is a 75 y.o. male admitted on 06/28/2016 with sepsis.  Pharmacy has been consulted for vanc/zosyn dosing. Given 1x doses of CTX/azithro in the ED. Tmax 103, labs pending.  Plan: Vanc 1500mg  IV x1; then 500mg  IV q12h Zosyn 3.375g IV (6min inf) x1; then 3.375g IV q8h (4h inf) Monitor clinical progress, c/s, renal function, abx plan/LOT VT@SS  as indicated   Height: 5\' 7"  (170.2 cm) Weight: 136 lb (61.7 kg) IBW/kg (Calculated) : 66.1  Temp (24hrs), Avg:101.7 F (38.7 C), Min:100.3 F (37.9 C), Max:103 F (39.4 C)  No results for input(s): WBC, CREATININE, LATICACIDVEN, VANCOTROUGH, VANCOPEAK, VANCORANDOM, GENTTROUGH, GENTPEAK, GENTRANDOM, TOBRATROUGH, TOBRAPEAK, TOBRARND, AMIKACINPEAK, AMIKACINTROU, AMIKACIN in the last 168 hours.  CrCl cannot be calculated (Patient's most recent lab result is older than the maximum 21 days allowed.).    No Known Allergies  Antimicrobials this admission: 8/11 vanc >>  8/11 zosyn >>   Dose adjustments this admission:   Microbiology results:    Elicia Lamp, PharmD, [facility] Clinical Pharmacist Pager [phone] 06/28/2016 6:47 PM

## 2016-06-28 NOTE — H&P (Signed)
History and Physical    Andrew [name] [id number] DOB: [birth year] DOA: 06/28/2016  PCP: Lujean Amel, MD   Patient coming from: Home   Chief Complaint: Dyspnea, chills   HPI: Andrew [name] is a 75 y.o. male with medical history significant for COPD on 2 L/m supplemental oxygen at baseline, hypertension, and GERD who presents the emergency department for evaluation of acute dyspnea and chills. Patient is taking part in outpatient pulmonary rehabilitation and reports walking daily. He went for his typical walk today, but found himself to be unusually short of breath, checked his oxygen saturation and found it to be in the 70s. He tended to walk prematurely, return back home, and notes that it took longer than expected for his oxygen saturations to stabilize in the high 80s despite resting. He had also developed chills by this point and came into the ED for evaluation. He reports going to bed yesterday in his usual state and felt normal this morning upon waking. There is been no increase in his cough or sputum production, no chest pain, and no palpitations. He denies lower extremity edema, orthopnea, or PND. He denies sick contacts per se, but notes that he does live with his school-age grandchild. There is been no recent long distance travel and has not been on antibiotics recently.  ED Course: Upon arrival to the ED, patient is found to be febrile to 39.4 C, saturating 96% on 3 L/m supplemental oxygen, tachypneic to 27, tachycardic in the 120s, and with stable blood pressure. EKG demonstrates a sinus tachycardia with rate 117 and chest x-ray is notable for hyperinflation and emphysema but negative for acute cardiopulmonary disease. Chemistry panel is largely unremarkable and CBC is notable for a stable normocytic anemia with hemoglobin of 11.3, and stable leukocytosis with WBC of 15,100. Lactic acid is reassuring at 1.29, troponin is undetectable, BNP is within the normal limits, and urinalysis is  unremarkable. Given the acute onset of the patient's symptoms, d-dimer was obtained and within the normal limits at 0.40. Blood and urine cultures were obtained, 2 L of normal saline were given as a bolus, patient was treated with duo nebs and acetaminophen, and empiric antibiotics were initiated in the form of vancomycin and Zosyn. Tachycardia improved considerably in the emergency department and the patient reported some subjective improvement in his dyspnea following the breathing treatment. He remains hemodynamically stable and will be admitted to the telemetry unit for ongoing evaluation and management of acute on chronic hypoxic respiratory failure with acute exacerbation and COPD, suspected secondary to CAP.  Review of Systems:  All other systems reviewed and apart from HPI, are negative.  Past Medical History:  Diagnosis Date  . Emphysema lung ([place])   . Hypertension   . Pulmonary hypertension ([place])     Past Surgical History:  Procedure Laterality Date  . LUNG SURGERY    . TONSILLECTOMY       reports that he quit smoking about 31 years ago. His smoking use included Cigarettes. He smoked 0.00 packs per day for 0.00 years. He has never used smokeless tobacco. He reports that he does not drink alcohol or use drugs.  No Known Allergies  Family History  Problem Relation Age of Onset  . Bone cancer Maternal Uncle      Prior to Admission medications   Medication Sig Start Date End Date Taking? Authorizing Provider  ADVAIR DISKUS 250-50 MCG/DOSE AEPB Inhale 1 puff into the lungs 2 (two) times daily. 11/01/15  Yes Noralee Space, MD  diltiazem (CARTIA XT) 240 MG 24 hr capsule Take 240 mg by mouth at bedtime.   Yes Historical Provider, MD  esomeprazole (NEXIUM) 20 MG capsule Take 20 mg by mouth 2 (two) times daily before a meal.    Yes Historical Provider, MD  finasteride (PROSCAR) 5 MG tablet Take 5 mg by mouth at bedtime.    Yes Historical Provider, MD  ibuprofen (ADVIL,MOTRIN) 200 MG  tablet Take 400 mg by mouth every 6 (six) hours as needed (pain).   Yes Historical Provider, MD  ipratropium-albuterol (DUONEB) 0.5-2.5 (3) MG/3ML SOLN Take 3 mLs by nebulization every 6 (six) hours as needed. Dx: J44.9 Patient taking differently: Take 3 mLs by nebulization every 6 (six) hours as needed (shortness of breath/ wheezing). Dx: J44.9 03/18/16  Yes Noralee Space, MD  liver oil-zinc oxide (DESITIN) 40 % ointment Apply 1 application topically See admin instructions. Apply to left butt cheek for wound care approximately every 4 hours while awake   Yes Historical Provider, MD  Multiple Vitamins-Minerals (MULTIVITAMIN WITH MINERALS) tablet Take 1 tablet by mouth daily.   Yes Historical Provider, MD  OVER THE COUNTER MEDICATION Apply 1 application topically See admin instructions. Protective ointment from home health care - apply approximately every 4 hours to left butt cheek for wound care   Yes Historical Provider, MD  OXYGEN Inhale 2-3 L into the lungs continuous. 2L daytime, 3L at night   Yes Historical Provider, MD  polyethylene glycol (MIRALAX / GLYCOLAX) packet Take 17 g by mouth daily. Patient taking differently: Take 17 g by mouth at bedtime. Mix in 8 oz liquid and drink 05/08/16  Yes Merrily Pew, MD  rosuvastatin (CRESTOR) 10 MG tablet Take 10 mg by mouth daily.   Yes Historical Provider, MD  sildenafil (REVATIO) 20 MG tablet Take 20 mg by mouth 2 (two) times daily.    Yes Historical Provider, MD  SPIRIVA HANDIHALER 18 MCG inhalation capsule INHALE THE CONTENTS OF 1 CAPSULE EVERY DAY Patient taking differently: INHALE THE CONTENTS OF 1 CAPSULE EVERY DAY AT NOON 04/23/16  Yes Noralee Space, MD  tamsulosin (FLOMAX) 0.4 MG CAPS capsule Take 1 capsule (0.4 mg total) by mouth daily after breakfast. 01/03/16  Yes Nita Sells, MD  temazepam (RESTORIL) 30 MG capsule Take 30 mg by mouth at bedtime.   Yes Historical Provider, MD  vitamin C (ASCORBIC ACID) 500 MG tablet Take 500 mg by mouth  daily.   Yes Historical Provider, MD  VITAMIN E PO Take 1 capsule by mouth daily.   Yes Historical Provider, MD  albuterol (PROVENTIL HFA;VENTOLIN HFA) 108 (90 Base) MCG/ACT inhaler Inhale 2 puffs into the lungs every 6 (six) hours as needed for wheezing or shortness of breath. Patient not taking: Reported on 06/28/2016 01/03/16   Nita Sells, MD    Physical Exam: Vitals:   06/28/16 2030 06/28/16 2035 06/28/16 2100 06/28/16 2143  BP: 124/55  132/57 140/67  Pulse: 102  105 96  Resp: (!) 27  21 (!) 21  Temp:  100.6 F (38.1 C)  98.3 F (36.8 C)  TempSrc:  Oral  Oral  SpO2: 96%  95% 97%  Weight:    61.7 kg (136 lb)  Height:    5\' 7"  (1.702 m)      Constitutional: In respiratory distress with accessory muscle use, no pallor or cyanosis Eyes: PERTLA, lids and conjunctivae normal ENMT: Mucous membranes are moist. Posterior pharynx clear of any exudate or lesions.   Neck: normal, supple, no  masses, no thyromegaly Respiratory: Diminished bilaterally with occasional expiratory wheeze. Increased work of breathing.  Cardiovascular: Rate ~100 and regular. No extremity edema. No significant JVD. Abdomen: No distension, no tenderness, no masses palpated. Bowel sounds normal.  Musculoskeletal: no clubbing / cyanosis. No joint deformity upper and lower extremities. Normal muscle tone.  Skin: no significant rashes, lesions, ulcers. Faint erythema overlies superior left buttocks. Warm, dry, well-perfused. Neurologic: CN 2-12 grossly intact. Sensation intact, DTR normal. Strength 5/5 in all 4 limbs.  Psychiatric: Normal judgment and insight. Alert and oriented x 3. Normal mood and affect.     Labs on Admission: I have personally reviewed following labs and imaging studies  CBC:  Recent Labs Lab 06/28/16 1807  WBC 15.1*  NEUTROABS 13.3*  HGB 11.3*  HCT 35.1*  MCV 88.2  PLT XX123456   Basic Metabolic Panel:  Recent Labs Lab 06/28/16 1807  NA 139  K 3.9  CL 105  CO2 26  GLUCOSE  121*  BUN 14  CREATININE 1.02  CALCIUM 8.9   GFR: Estimated Creatinine Clearance: 53.8 mL/min (by C-G formula based on SCr of 1.02 mg/dL). Liver Function Tests:  Recent Labs Lab 06/28/16 1807  AST 21  ALT 22  ALKPHOS 74  BILITOT 0.3  PROT 6.7  ALBUMIN 4.0   No results for input(s): LIPASE, AMYLASE in the last 168 hours. No results for input(s): AMMONIA in the last 168 hours. Coagulation Profile: No results for input(s): INR, PROTIME in the last 168 hours. Cardiac Enzymes:  Recent Labs Lab 06/28/16 1807  TROPONINI <0.03   BNP (last 3 results) No results for input(s): PROBNP in the last 8760 hours. HbA1C: No results for input(s): HGBA1C in the last 72 hours. CBG: No results for input(s): GLUCAP in the last 168 hours. Lipid Profile: No results for input(s): CHOL, HDL, LDLCALC, TRIG, CHOLHDL, LDLDIRECT in the last 72 hours. Thyroid Function Tests: No results for input(s): TSH, T4TOTAL, FREET4, T3FREE, THYROIDAB in the last 72 hours. Anemia Panel: No results for input(s): VITAMINB12, FOLATE, FERRITIN, TIBC, IRON, RETICCTPCT in the last 72 hours. Urine analysis:    Component Value Date/Time   COLORURINE YELLOW 06/28/2016 1913   APPEARANCEUR CLEAR 06/28/2016 1913   LABSPEC 1.017 06/28/2016 1913   PHURINE 7.5 06/28/2016 1913   GLUCOSEU NEGATIVE 06/28/2016 1913   HGBUR NEGATIVE 06/28/2016 [place] NEGATIVE 06/28/2016 1913   KETONESUR 15 (A) 06/28/2016 1913   PROTEINUR NEGATIVE 06/28/2016 1913   NITRITE NEGATIVE 06/28/2016 1913   LEUKOCYTESUR NEGATIVE 06/28/2016 1913   Sepsis Labs: @LABRCNTIP (procalcitonin:4,lacticidven:4) )No results found for this or any previous visit (from the past 240 hour(s)).   Radiological Exams on Admission: Dg Chest Portable 1 View  Result Date: 06/28/2016 CLINICAL DATA:  Shortness of breath since 3 p.m. today. Hard to breathe with or without oxygen. EXAM: PORTABLE CHEST 1 VIEW COMPARISON:  05/08/2016 FINDINGS: Lungs are  hyperinflated. Marked emphysematous changes are identified at the apices. Crowded lung markings are identified at the bases. No focal consolidations or pleural effusions. IMPRESSION: Hyperinflation/emphysema. No focal acute pulmonary abnormality. Electronically Signed   By: Nolon Nations M.D.   On: 06/28/2016 17:56    EKG: Independently reviewed. Sinus tachycardia (rate 117), ST-depression anteriorly  Assessment/Plan  1. Sepsis, suspected secondary to CAP  - Pt presents with tachypnea, tachycardia, fever, leukocytosis, and suspected pulmonary etiology  - No definite infiltrate noted on CXR, though hazy opacity in RLL  - 30 cc/kg NS bolus given in ED  - Blood and urine  cultures are incubating, sputum culture and GS requested  - Check urine for strep pneumo and legionella antigens - Lactate reassuring at 1.29; trend PCT  - Empiric treatment with vancomycin and Zosyn started in ED, will continue with Rocephin and azithromycin while awaiting culture data   2. COPD with acute exacerbation and chronic hypoxic respiratory failure  - Requires 2 Lpm supplemental O2 around the clock  - Suspect an infectious etiology for the current exacerbation given fever  - Systemic steroids with Solu-Medrol 60 mg IV q6h  - DuoNebs prn; scheduled Dulera and Spiriva  - Monitor with continuous pulse oximetry and titrate FiO2 to maintain sat 88-92%  - Wean steroids as appropriate   3. OSA  - Stable, continue CPAP qHS    4. GERD  - Stable, managed with Nexium at home  - Continue PPI therapy with Protonix while in hospital   5. Normocytic anemia  - Hgb 11.3 on admission and stable relative to priors  - No sign of active blood-loss  6. Hypertension  - At goal currently  - Continue current management with diltiazem   7. Insomnia  - Continue temazepam prn   8. Pressure sore  - Present on arrival at left buttock - Wound consult requested    DVT prophylaxis: sq Lovenox  Code Status: Full  Family  Communication: Discussed with patient Disposition Plan: Admit to telemetry Consults called: None Admission status: Inpatient    Vianne Bulls, MD [facility] Pager [phone]  If 7PM-7AM, please contact night-coverage [email or web address] Password [email or web address]  06/28/2016, 10:06 PM

## 2016-06-28 NOTE — ED Notes (Signed)
On checking rectal temp noted redness to sacrum; it blanches, there is no broken skin. There is a dry, scaly patch of skin on the buttock below sacrum as well. Patient reports this is not new.

## 2016-06-28 NOTE — ED Triage Notes (Signed)
Pt presents to the ed for shortness of breath x 24 hours , patient has a history of pneumonia, patient is 95% on 3L, patient reports being on 2L at home, denies pain

## 2016-06-29 DIAGNOSIS — L89300 Pressure ulcer of unspecified buttock, unstageable: Secondary | ICD-10-CM

## 2016-06-29 DIAGNOSIS — I10 Essential (primary) hypertension: Secondary | ICD-10-CM

## 2016-06-29 DIAGNOSIS — A419 Sepsis, unspecified organism: Principal | ICD-10-CM

## 2016-06-29 DIAGNOSIS — D72829 Elevated white blood cell count, unspecified: Secondary | ICD-10-CM

## 2016-06-29 DIAGNOSIS — J441 Chronic obstructive pulmonary disease with (acute) exacerbation: Secondary | ICD-10-CM

## 2016-06-29 DIAGNOSIS — J9621 Acute and chronic respiratory failure with hypoxia: Secondary | ICD-10-CM

## 2016-06-29 DIAGNOSIS — D649 Anemia, unspecified: Secondary | ICD-10-CM

## 2016-06-29 DIAGNOSIS — J189 Pneumonia, unspecified organism: Secondary | ICD-10-CM

## 2016-06-29 DIAGNOSIS — K219 Gastro-esophageal reflux disease without esophagitis: Secondary | ICD-10-CM

## 2016-06-29 DIAGNOSIS — G4733 Obstructive sleep apnea (adult) (pediatric): Secondary | ICD-10-CM

## 2016-06-29 LAB — HIV ANTIBODY (ROUTINE TESTING W REFLEX): HIV Screen 4th Generation wRfx: NONREACTIVE

## 2016-06-29 LAB — BASIC METABOLIC PANEL
Anion gap: 9 (ref 5–15)
BUN: 9 mg/dL (ref 6–20)
CHLORIDE: 107 mmol/L (ref 101–111)
CO2: 25 mmol/L (ref 22–32)
CREATININE: 0.78 mg/dL (ref 0.61–1.24)
Calcium: 8.7 mg/dL — ABNORMAL LOW (ref 8.9–10.3)
GFR calc Af Amer: >60 mL/min (ref >60)
GFR calc non Af Amer: >60 mL/min (ref >60)
GLUCOSE: 158 mg/dL — AB (ref 65–99)
POTASSIUM: 3.8 mmol/L (ref 3.5–5.1)
SODIUM: 141 mmol/L (ref 135–145)

## 2016-06-29 LAB — URINE CULTURE

## 2016-06-29 LAB — STREP PNEUMONIAE URINARY ANTIGEN: STREP PNEUMO URINARY ANTIGEN: NEGATIVE

## 2016-06-29 MED ORDER — CETYLPYRIDINIUM CHLORIDE 0.05 % MT LIQD
7.0000 mL | Freq: Two times a day (BID) | OROMUCOSAL | Status: DC
  Administered 2016-06-29 – 2016-07-01 (×6): 7 mL via OROMUCOSAL

## 2016-06-29 NOTE — Consult Note (Signed)
[place] Nurse wound consult note Reason for Consult:Intertriginous dermatitis at the gluteal cleft with two areas of partial thickness tissue loss Wound type:Moisture associated skin damage, specifically intertriginous dermatitis Pressure Ulcer POA: No Measurement:Left area of denudation is > right.  Left area of partial thickness tissue loss measures 0.6cm round x 0.1cm.  The right area measures 0.4cm round and is recently reepithelialized.  Patient reports that he sits on a ROHO pressure redistribution chair cushion at home (a high end product with superior pressure redistribution properties), so no cushion will be provided for him here.  He will turn and reposition off of the affected area while here, avoiding the supine position except for meals. Wound DO:[id number], pink, dry Drainage (amount, consistency, odor) None Periwound: Mild blanching erythema in the gluteal cleft Dressing procedure/placement/frequency: I have provided the patient with our house, no rinse, pH balanced spray cleanser (EasiCleanse) and two tubes of our house moisture barrier ointment (Critic Aid).  He is taught to use a pea sized drop of the product to provide coverage to the area as "less is more".  He indicates understanding and appreciation for the visit and product. [place] nursing team will not follow, but will remain available to this patient, the nursing and medical teams.  Please re-consult if needed. Thanks, Maudie Flakes, MSN, RN, [place], Arther Abbott  Pager# [phone]

## 2016-06-29 NOTE — Progress Notes (Signed)
Patient arrived on unit via stretcher. Patient alert and oriented x4. Patient oriented to room, staff and unit. No family at bedside. Patient placed on telemetry, [place] notified. Skin assessment completed with Andrew Hua, RN, check flowsheets. Patient denies pain. NS infusing through L AC IV. Safety Fall Prevention Plan discussed with patient. Orders have been reviewed and implemented. Will continue to monitor the patient. Call light has been placed within reach and bed alarm has been activated.   [name] BSN, RN  Phone Number: [phone]

## 2016-06-29 NOTE — Progress Notes (Addendum)
PROGRESS NOTE    Benjermin Curtsinger  [id number] DOB: 1941/05/21 DOA: 06/28/2016 PCP: Lujean Amel, MD   Chief Complaint  Patient presents with  . Shortness of Breath    Brief Narrative:  HPI on 06/28/2016 by Dr. Christia Reading Opyd Zyien Dieken is a 75 y.o. male with medical history significant for COPD on 2 L/m supplemental oxygen at baseline, hypertension, and GERD who presents the emergency department for evaluation of acute dyspnea and chills. Patient is taking part in outpatient pulmonary rehabilitation and reports walking daily. He went for his typical walk today, but found himself to be unusually short of breath, checked his oxygen saturation and found it to be in the 70s. He tended to walk prematurely, return back home, and notes that it took longer than expected for his oxygen saturations to stabilize in the high 80s despite resting. He had also developed chills by this point and came into the ED for evaluation. He reports going to bed yesterday in his usual state and felt normal this morning upon waking. There is been no increase in his cough or sputum production, no chest pain, and no palpitations. He denies lower extremity edema, orthopnea, or PND. He denies sick contacts per se, but notes that he does live with his school-age grandchild. There is been no recent long distance travel and has not been on antibiotics recently.  Assessment & Plan   Sepsis, suspected secondary to CAP  -Upon admission, patient was tachypneic, tachycardic, febrile with leukocytosis, and suspected pulmonary etiology  -CXR: hyperinflation/emphysema, no definite infiltrate, ?RLL opacity -Blood cultures show no growth to date -strep pneumonia urine antigen negative, legionella pending - Check urine for strep pneumo and legionella antigens -Patient was given vancomycin and zosyn in the ED -Continue azithromycin and ceftriaxone  COPD with acute exacerbation and chronic hypoxic respiratory failure -Requires 2 L  supplemental O2 during the day, and sometimes 3L at night -Suspect an infectious etiology for the current exacerbation given fever  -Continue solumedrol, scheduled dulera and spiriva, debs PRN  OSA  -Stable, continue CPAP qHS    GERD  -Stable, continue PPI  Normocytic anemia  -Hemoglobin 11.3 on admission and stable relative to priors  -Continue to monitor CBC  Hypertension  -Stable, continue cardizem  Insomnia  -Continue temazepam prn   Pressure sore  -Present on arrival at left buttock -Wound care consulted.   DVT Prophylaxis  lovenox  Code Status: Full  Family Communication: None at bedside  Disposition Plan: Admitted.   Consultants None  Procedures  None  Antibiotics   Anti-infectives    Start     Dose/Rate Route Frequency Ordered Stop   06/29/16 0630  vancomycin (VANCOCIN) 500 mg in sodium chloride 0.9 % 100 mL IVPB  Status:  Discontinued     500 mg 100 mL/hr over 60 Minutes Intravenous Every 12 hours 06/28/16 1922 06/28/16 2205   06/29/16 0230  piperacillin-tazobactam (ZOSYN) IVPB 3.375 g  Status:  Discontinued     3.375 g 12.5 mL/hr over 240 Minutes Intravenous Every 8 hours 06/28/16 1922 06/28/16 2205   06/28/16 2330  cefTRIAXone (ROCEPHIN) 1 g in dextrose 5 % 50 mL IVPB     1 g 100 mL/hr over 30 Minutes Intravenous Every 24 hours 06/28/16 2205 07/05/16 2329   06/28/16 2330  azithromycin (ZITHROMAX) 500 mg in dextrose 5 % 250 mL IVPB     500 mg 250 mL/hr over 60 Minutes Intravenous Every 24 hours 06/28/16 2205 07/05/16 2329   06/28/16 1830  vancomycin (VANCOCIN)  1,500 mg in sodium chloride 0.9 % 500 mL IVPB     1,500 mg 250 mL/hr over 120 Minutes Intravenous  Once 06/28/16 1829 06/28/16 2045   06/28/16 1815  cefTRIAXone (ROCEPHIN) 1 g in dextrose 5 % 50 mL IVPB  Status:  Discontinued     1 g 100 mL/hr over 30 Minutes Intravenous  Once 06/28/16 1815 06/28/16 1827   06/28/16 1815  azithromycin (ZITHROMAX) 500 mg in dextrose 5 % 250 mL IVPB   Status:  Discontinued     500 mg 250 mL/hr over 60 Minutes Intravenous  Once 06/28/16 1815 06/28/16 1827   06/28/16 1815  piperacillin-tazobactam (ZOSYN) IVPB 3.375 g     3.375 g 100 mL/hr over 30 Minutes Intravenous  Once 06/28/16 1815 06/28/16 1915   06/28/16 1815  vancomycin (VANCOCIN) IVPB 1000 mg/200 mL premix  Status:  Discontinued     1,000 mg 200 mL/hr over 60 Minutes Intravenous  Once 06/28/16 1815 06/28/16 1829      Subjective:   [name] seen and examined today.  Patient states he is feeling mildly better as compared to yesterday but still does not "feel like himself."   Denies chest pain.  Does have occasional cough. Denies wheezing. States he had been using his neb treatments at home but did not feel they made a difference.  Denies abdominal pain, nausea, vomiting, diarrhea, constipation, dizziness, headache.  Objective:   Vitals:   06/28/16 2335 06/29/16 0510 06/29/16 0817 06/29/16 0944  BP:  (!) 114/53  127/66  Pulse:  83  83  Resp:  20  18  Temp:  98.7 F (37.1 C)  97.9 F (36.6 C)  TempSrc:  Oral  Oral  SpO2: 99% 97% 95% 99%  Weight:      Height:        Intake/Output Summary (Last 24 hours) at 06/29/16 1048 Last data filed at 06/29/16 0900  Gross per 24 hour  Intake          1601.25 ml  Output             1915 ml  Net          -313.75 ml   Filed Weights   06/28/16 1801 06/28/16 2143  Weight: 61.7 kg (136 lb) 61.7 kg (136 lb)    Exam  General: Well developed, well nourished, NAD, appears stated age  HEENT: NCAT, mucous membranes moist.   Cardiovascular: S1 S2 auscultated, RRR, no murmurs  Respiratory: Clear but diminished, mild exp wheezing.   Abdomen: Soft, nontender, nondistended, + bowel sounds  Extremities: warm dry without cyanosis clubbing or edema  Neuro: AAOx3, nonfocal  Psych: Normal affect and demeanor with intact judgement and insight   Data Reviewed: I have personally reviewed following labs and imaging  studies  CBC:  Recent Labs Lab 06/28/16 1807  WBC 15.1*  NEUTROABS 13.3*  HGB 11.3*  HCT 35.1*  MCV 88.2  PLT [id number]   Basic Metabolic Panel:  Recent Labs Lab 06/28/16 1807 06/29/16 0612  NA 139 141  K 3.9 3.8  CL 105 107  CO2 26 25  GLUCOSE 121* 158*  BUN 14 9  CREATININE 1.02 0.78  CALCIUM 8.9 8.7*   GFR: Estimated Creatinine Clearance: 68.6 mL/min (by C-G formula based on SCr of 0.8 mg/dL). Liver Function Tests:  Recent Labs Lab 06/28/16 1807  AST 21  ALT 22  ALKPHOS 74  BILITOT 0.3  PROT 6.7  ALBUMIN 4.0   No results for input(s): LIPASE,  AMYLASE in the last 168 hours. No results for input(s): AMMONIA in the last 168 hours. Coagulation Profile: No results for input(s): INR, PROTIME in the last 168 hours. Cardiac Enzymes:  Recent Labs Lab 06/28/16 1807  TROPONINI <0.03   BNP (last 3 results) No results for input(s): PROBNP in the last 8760 hours. HbA1C: No results for input(s): HGBA1C in the last 72 hours. CBG: No results for input(s): GLUCAP in the last 168 hours. Lipid Profile: No results for input(s): CHOL, HDL, LDLCALC, TRIG, CHOLHDL, LDLDIRECT in the last 72 hours. Thyroid Function Tests: No results for input(s): TSH, T4TOTAL, FREET4, T3FREE, THYROIDAB in the last 72 hours. Anemia Panel: No results for input(s): VITAMINB12, FOLATE, FERRITIN, TIBC, IRON, RETICCTPCT in the last 72 hours. Urine analysis:    Component Value Date/Time   COLORURINE YELLOW 06/28/2016 1913   APPEARANCEUR CLEAR 06/28/2016 1913   LABSPEC 1.017 06/28/2016 1913   PHURINE 7.5 06/28/2016 1913   GLUCOSEU NEGATIVE 06/28/2016 1913   HGBUR NEGATIVE 06/28/2016 [place] NEGATIVE 06/28/2016 1913   KETONESUR 15 (A) 06/28/2016 1913   PROTEINUR NEGATIVE 06/28/2016 1913   NITRITE NEGATIVE 06/28/2016 1913   LEUKOCYTESUR NEGATIVE 06/28/2016 1913   Sepsis Labs: @LABRCNTIP (procalcitonin:4,lacticidven:4)  )No results found for this or any previous visit (from the  past 240 hour(s)).    Radiology Studies: Dg Chest Portable 1 View  Result Date: 06/28/2016 CLINICAL DATA:  Shortness of breath since 3 p.m. today. Hard to breathe with or without oxygen. EXAM: PORTABLE CHEST 1 VIEW COMPARISON:  05/08/2016 FINDINGS: Lungs are hyperinflated. Marked emphysematous changes are identified at the apices. Crowded lung markings are identified at the bases. No focal consolidations or pleural effusions. IMPRESSION: Hyperinflation/emphysema. No focal acute pulmonary abnormality. Electronically Signed   By: Nolon Nations M.D.   On: 06/28/2016 17:56     Scheduled Meds: . antiseptic oral rinse  7 mL Mouth Rinse BID  . azithromycin  500 mg Intravenous Q24H  . cefTRIAXone (ROCEPHIN)  IV  1 g Intravenous Q24H  . diltiazem  240 mg Oral QHS  . enoxaparin (LOVENOX) injection  40 mg Subcutaneous Q24H  . finasteride  5 mg Oral QHS  . methylPREDNISolone (SOLU-MEDROL) injection  60 mg Intravenous Q6H  . mometasone-formoterol  2 puff Inhalation BID  . pantoprazole  40 mg Oral Daily  . polyethylene glycol  17 g Oral QHS  . rosuvastatin  10 mg Oral Daily  . tamsulosin  0.4 mg Oral QPC breakfast  . temazepam  30 mg Oral QHS  . tiotropium  18 mcg Inhalation Daily   Continuous Infusions:    LOS: 1 day   Time Spent in minutes   30 minutes  Cadee Agro D.O. on 06/29/2016 at 10:48 AM  Between 7am to 7pm - Pager - [phone]  After 7pm go to [email or web address] - password [email or web address]  And look for the night coverage person covering for me after hours  [facility] Office  [phone]

## 2016-06-30 MED ORDER — SILDENAFIL CITRATE 20 MG PO TABS
20.0000 mg | ORAL_TABLET | Freq: Two times a day (BID) | ORAL | Status: DC
  Administered 2016-06-30 – 2016-07-01 (×3): 20 mg via ORAL
  Filled 2016-06-30 (×3): qty 1

## 2016-06-30 MED ORDER — IPRATROPIUM-ALBUTEROL 0.5-2.5 (3) MG/3ML IN SOLN
3.0000 mL | Freq: Three times a day (TID) | RESPIRATORY_TRACT | Status: DC
  Administered 2016-06-30 – 2016-07-01 (×3): 3 mL via RESPIRATORY_TRACT
  Filled 2016-06-30 (×3): qty 3

## 2016-06-30 MED ORDER — SALINE SPRAY 0.65 % NA SOLN
1.0000 | NASAL | Status: DC | PRN
  Administered 2016-06-30 – 2016-07-01 (×3): 1 via NASAL
  Filled 2016-06-30: qty 44

## 2016-06-30 NOTE — Progress Notes (Signed)
PROGRESS NOTE    Andrew [name]  [id number] DOB: 06-09-1941 DOA: 06/28/2016 PCP: Lujean Amel, MD   Chief Complaint  Patient presents with  . Shortness of Breath    Brief Narrative:  HPI on 06/28/2016 by Dr. Christia Reading Opyd Artemus Andrew [name] is a 75 y.o. male with medical history significant for COPD on 2 L/m supplemental oxygen at baseline, hypertension, and GERD who presents the emergency department for evaluation of acute dyspnea and chills. Patient is taking part in outpatient pulmonary rehabilitation and reports walking daily. He went for his typical walk today, but found himself to be unusually short of breath, checked his oxygen saturation and found it to be in the 70s. He tended to walk prematurely, return back home, and notes that it took longer than expected for his oxygen saturations to stabilize in the high 80s despite resting. He had also developed chills by this point and came into the ED for evaluation. He reports going to bed yesterday in his usual state and felt normal this morning upon waking. There is been no increase in his cough or sputum production, no chest pain, and no palpitations. He denies lower extremity edema, orthopnea, or PND. He denies sick contacts per se, but notes that he does live with his school-age grandchild. There is been no recent long distance travel and has not been on antibiotics recently.  Assessment & Plan   Sepsis, suspected secondary to CAP  -Upon admission, patient was tachypneic, tachycardic, febrile with leukocytosis, and suspected pulmonary etiology  -CXR: hyperinflation/emphysema, no definite infiltrate, ?RLL opacity -Blood cultures show no growth to date -strep pneumonia urine antigen negative, legionella pending - Check urine for strep pneumo and legionella antigens -Patient was given vancomycin and zosyn in the ED -Continue azithromycin and ceftriaxone -Would like to patient to get out of bed today  COPD with acute exacerbation and  chronic hypoxic respiratory failure/ PAH -Requires 2 L supplemental O2 during the day, and sometimes 3L at night -Suspect an infectious etiology for the current exacerbation given fever  -Continue solumedrol, scheduled dulera and spiriva, debs PRN -Will restart Sildenafil  OSA  -Stable, continue CPAP qHS    GERD  -Stable, continue PPI  Normocytic anemia  -Hemoglobin 11.3 on admission and stable relative to priors  -Continue to monitor CBC  Hypertension  -Stable, continue cardizem  Insomnia  -Continue temazepam prn   Pressure sore  -Present on arrival at left buttock -Wound care consulted.   Nasal congestion -Will order nasal saline  DVT Prophylaxis  lovenox  Code Status: Full  Family Communication: None at bedside  Disposition Plan: Admitted. Possible discharge within 24-48 hours  Consultants None  Procedures  None  Antibiotics   Anti-infectives    Start     Dose/Rate Route Frequency Ordered Stop   06/29/16 0630  vancomycin (VANCOCIN) 500 mg in sodium chloride 0.9 % 100 mL IVPB  Status:  Discontinued     500 mg 100 mL/hr over 60 Minutes Intravenous Every 12 hours 06/28/16 1922 06/28/16 2205   06/29/16 0230  piperacillin-tazobactam (ZOSYN) IVPB 3.375 g  Status:  Discontinued     3.375 g 12.5 mL/hr over 240 Minutes Intravenous Every 8 hours 06/28/16 1922 06/28/16 2205   06/28/16 2330  cefTRIAXone (ROCEPHIN) 1 g in dextrose 5 % 50 mL IVPB     1 g 100 mL/hr over 30 Minutes Intravenous Every 24 hours 06/28/16 2205 07/05/16 2329   06/28/16 2330  azithromycin (ZITHROMAX) 500 mg in dextrose 5 % 250 mL IVPB  500 mg 250 mL/hr over 60 Minutes Intravenous Every 24 hours 06/28/16 2205 07/05/16 2329   06/28/16 1830  vancomycin (VANCOCIN) 1,500 mg in sodium chloride 0.9 % 500 mL IVPB     1,500 mg 250 mL/hr over 120 Minutes Intravenous  Once 06/28/16 1829 06/28/16 2045   06/28/16 1815  cefTRIAXone (ROCEPHIN) 1 g in dextrose 5 % 50 mL IVPB  Status:  Discontinued      1 g 100 mL/hr over 30 Minutes Intravenous  Once 06/28/16 1815 06/28/16 1827   06/28/16 1815  azithromycin (ZITHROMAX) 500 mg in dextrose 5 % 250 mL IVPB  Status:  Discontinued     500 mg 250 mL/hr over 60 Minutes Intravenous  Once 06/28/16 1815 06/28/16 1827   06/28/16 1815  piperacillin-tazobactam (ZOSYN) IVPB 3.375 g     3.375 g 100 mL/hr over 30 Minutes Intravenous  Once 06/28/16 1815 06/28/16 1915   06/28/16 1815  vancomycin (VANCOCIN) IVPB 1000 mg/200 mL premix  Status:  Discontinued     1,000 mg 200 mL/hr over 60 Minutes Intravenous  Once 06/28/16 1815 06/28/16 1829      Subjective:   Andrew [name] seen and examined today.  Patient feels his breathing has improved and close to his baseline. Does complain of nasal congestion.  Denies chest pain.   Denies abdominal pain, nausea, vomiting, diarrhea, constipation, dizziness, headache.  Objective:   Vitals:   06/29/16 2019 06/29/16 2053 06/30/16 0528 06/30/16 0939  BP:  125/60 114/61 116/61  Pulse:  90 66 80  Resp:  16 16 18   Temp:  98.6 F (37 C) 98.7 F (37.1 C)   TempSrc:  Oral Oral   SpO2: 98% 98% 96% 100%  Weight:      Height:        Intake/Output Summary (Last 24 hours) at 06/30/16 1053 Last data filed at 06/30/16 0900  Gross per 24 hour  Intake              600 ml  Output             1101 ml  Net             -501 ml   Filed Weights   06/28/16 1801 06/28/16 2143  Weight: 61.7 kg (136 lb) 61.7 kg (136 lb)    Exam  General: Well developed, well nourished, NAD, appears stated age  HEENT: NCAT, mucous membranes moist.   Cardiovascular: S1 S2 auscultated, RRR, no murmurs  Respiratory: Clear but diminished, no wheezing noted.   Abdomen: Soft, nontender, nondistended, + bowel sounds  Extremities: warm dry without cyanosis clubbing or edema  Neuro: AAOx3, nonfocal  Psych: Normal affect and demeanor with intact judgement and insight, pleasant   Data Reviewed: I have personally reviewed following labs  and imaging studies  CBC:  Recent Labs Lab 06/28/16 1807  WBC 15.1*  NEUTROABS 13.3*  HGB 11.3*  HCT 35.1*  MCV 88.2  PLT [id number]   Basic Metabolic Panel:  Recent Labs Lab 06/28/16 1807 06/29/16 0612  NA 139 141  K 3.9 3.8  CL 105 107  CO2 26 25  GLUCOSE 121* 158*  BUN 14 9  CREATININE 1.02 0.78  CALCIUM 8.9 8.7*   GFR: Estimated Creatinine Clearance: 68.6 mL/min (by C-G formula based on SCr of 0.8 mg/dL). Liver Function Tests:  Recent Labs Lab 06/28/16 1807  AST 21  ALT 22  ALKPHOS 74  BILITOT 0.3  PROT 6.7  ALBUMIN 4.0   No results for  input(s): LIPASE, AMYLASE in the last 168 hours. No results for input(s): AMMONIA in the last 168 hours. Coagulation Profile: No results for input(s): INR, PROTIME in the last 168 hours. Cardiac Enzymes:  Recent Labs Lab 06/28/16 1807  TROPONINI <0.03   BNP (last 3 results) No results for input(s): PROBNP in the last 8760 hours. HbA1C: No results for input(s): HGBA1C in the last 72 hours. CBG: No results for input(s): GLUCAP in the last 168 hours. Lipid Profile: No results for input(s): CHOL, HDL, LDLCALC, TRIG, CHOLHDL, LDLDIRECT in the last 72 hours. Thyroid Function Tests: No results for input(s): TSH, T4TOTAL, FREET4, T3FREE, THYROIDAB in the last 72 hours. Anemia Panel: No results for input(s): VITAMINB12, FOLATE, FERRITIN, TIBC, IRON, RETICCTPCT in the last 72 hours. Urine analysis:    Component Value Date/Time   COLORURINE YELLOW 06/28/2016 1913   APPEARANCEUR CLEAR 06/28/2016 1913   LABSPEC 1.017 06/28/2016 1913   PHURINE 7.5 06/28/2016 1913   GLUCOSEU NEGATIVE 06/28/2016 1913   HGBUR NEGATIVE 06/28/2016 [place] 06/28/2016 1913   KETONESUR 15 (A) 06/28/2016 1913   PROTEINUR NEGATIVE 06/28/2016 1913   NITRITE NEGATIVE 06/28/2016 1913   LEUKOCYTESUR NEGATIVE 06/28/2016 1913   Sepsis Labs: @LABRCNTIP (procalcitonin:4,lacticidven:4)  ) Recent Results (from the past 240 hour(s))    Blood culture (routine x 2)     Status: None (Preliminary result)   Collection Time: 06/28/16  6:07 PM  Result Value Ref Range Status   Specimen Description BLOOD LEFT ANTECUBITAL  Final   Special Requests BOTTLES DRAWN AEROBIC AND ANAEROBIC 5ML   Final   Culture NO GROWTH < 24 HOURS  Final   Report Status PENDING  Incomplete  Blood culture (routine x 2)     Status: None (Preliminary result)   Collection Time: 06/28/16  6:09 PM  Result Value Ref Range Status   Specimen Description BLOOD RIGHT HAND  Final   Special Requests BOTTLES DRAWN AEROBIC ONLY 5CC  Final   Culture NO GROWTH < 24 HOURS  Final   Report Status PENDING  Incomplete  Urine culture     Status: Abnormal   Collection Time: 06/28/16  7:14 PM  Result Value Ref Range Status   Specimen Description URINE, RANDOM  Final   Special Requests NONE  Final   Culture MULTIPLE SPECIES PRESENT, SUGGEST RECOLLECTION (A)  Final   Report Status 06/29/2016 FINAL  Final      Radiology Studies: Dg Chest Portable 1 View  Result Date: 06/28/2016 CLINICAL DATA:  Shortness of breath since 3 p.m. today. Hard to breathe with or without oxygen. EXAM: PORTABLE CHEST 1 VIEW COMPARISON:  05/08/2016 FINDINGS: Lungs are hyperinflated. Marked emphysematous changes are identified at the apices. Crowded lung markings are identified at the bases. No focal consolidations or pleural effusions. IMPRESSION: Hyperinflation/emphysema. No focal acute pulmonary abnormality. Electronically Signed   By: Nolon Nations M.D.   On: 06/28/2016 17:56     Scheduled Meds: . antiseptic oral rinse  7 mL Mouth Rinse BID  . azithromycin  500 mg Intravenous Q24H  . cefTRIAXone (ROCEPHIN)  IV  1 g Intravenous Q24H  . diltiazem  240 mg Oral QHS  . enoxaparin (LOVENOX) injection  40 mg Subcutaneous Q24H  . finasteride  5 mg Oral QHS  . ipratropium-albuterol  3 mL Nebulization TID  . methylPREDNISolone (SOLU-MEDROL) injection  60 mg Intravenous Q6H  .  mometasone-formoterol  2 puff Inhalation BID  . pantoprazole  40 mg Oral Daily  . polyethylene glycol  17 g Oral QHS  . rosuvastatin  10 mg Oral Daily  . sildenafil  20 mg Oral BID  . tamsulosin  0.4 mg Oral QPC breakfast  . temazepam  30 mg Oral QHS  . tiotropium  18 mcg Inhalation Daily   Continuous Infusions:    LOS: 2 days   Time Spent in minutes   30 minutes  Jacklyne Baik D.O. on 06/30/2016 at 10:53 AM  Between 7am to 7pm - Pager - [phone]  After 7pm go to [email or web address] - password [email or web address]  And look for the night coverage person covering for me after hours  [facility] Office  [phone]

## 2016-07-01 LAB — BASIC METABOLIC PANEL
Anion gap: 8 (ref 5–15)
BUN: 20 mg/dL (ref 6–20)
CHLORIDE: 107 mmol/L (ref 101–111)
CO2: 24 mmol/L (ref 22–32)
CREATININE: 0.88 mg/dL (ref 0.61–1.24)
Calcium: 8.9 mg/dL (ref 8.9–10.3)
GFR calc non Af Amer: >60 mL/min (ref >60)
Glucose, Bld: 167 mg/dL — ABNORMAL HIGH (ref 65–99)
Potassium: 3.8 mmol/L (ref 3.5–5.1)
Sodium: 139 mmol/L (ref 135–145)

## 2016-07-01 LAB — CBC
HEMATOCRIT: 30.8 % — AB (ref 39.0–52.0)
HEMOGLOBIN: 9.8 g/dL — AB (ref 13.0–17.0)
MCH: 28.3 pg (ref 26.0–34.0)
MCHC: 31.8 g/dL (ref 30.0–36.0)
MCV: 89 fL (ref 78.0–100.0)
Platelets: 181 10*3/uL (ref 150–400)
RBC: 3.46 MIL/uL — ABNORMAL LOW (ref 4.22–5.81)
RDW: 16.2 % — ABNORMAL HIGH (ref 11.5–15.5)
WBC: 12.2 10*3/uL — ABNORMAL HIGH (ref 4.0–10.5)

## 2016-07-01 LAB — LEGIONELLA PNEUMOPHILA SEROGP 1 UR AG: L. pneumophila Serogp 1 Ur Ag: NEGATIVE

## 2016-07-01 MED ORDER — PREDNISONE 10 MG PO TABS: ORAL_TABLET | ORAL | 0 refills | Status: DC

## 2016-07-01 MED ORDER — AZITHROMYCIN 500 MG PO TABS: 500.0000 mg | ORAL_TABLET | Freq: Every day | ORAL | 0 refills | Status: DC

## 2016-07-01 MED ORDER — CEFUROXIME AXETIL 500 MG PO TABS: 500.0000 mg | ORAL_TABLET | Freq: Two times a day (BID) | ORAL | 0 refills | Status: DC

## 2016-07-01 NOTE — Discharge Summary (Signed)
Physician Discharge Summary  Andrew [name] [id number] DOB: 12-06-1940 DOA: 06/28/2016  PCP: Lujean Amel, MD  Admit date: 06/28/2016 Discharge date: 07/01/2016  Time spent: 45 minutes  Recommendations for Outpatient Follow-up:  Patient will be discharged to home.  Patient will need to follow up with primary care provider within one week of discharge, repeat CBC. Follow up with pulmonology. Patient should continue medications as prescribed.  Patient should follow a regualr diet.   Discharge Diagnoses:  Principal Problem:   CAP (community acquired pneumonia) Active Problems:   Sepsis ([place])   Acute on chronic respiratory failure with hypoxia (HCC)   Leukocytosis   Hypertension   COPD exacerbation (HCC)   OSA (obstructive sleep apnea)   GERD (gastroesophageal reflux disease)   Normocytic anemia   Pressure sore on buttocks   Discharge Condition: Stable  Diet recommendation: regular  Filed Weights   06/28/16 1801 06/28/16 2143  Weight: 61.7 kg (136 lb) 61.7 kg (136 lb)    History of present illness:  on 06/28/2016 by Dr. Benjamine Mola Bowmanis a 75 y.o.malewith medical history significant for COPD on 2 L/m supplemental oxygen at baseline, hypertension, and GERD who presents the emergency department for evaluation of acute dyspnea and chills. Patient is taking part in outpatient pulmonary rehabilitation and reports walking daily. He went for his typical walk today, but found himself to be unusually short of breath, checked his oxygen saturation and found it to be in the 70s. He tended to walk prematurely, return back home, and notes that it took longer than expected for his oxygen saturations to stabilize in the high 80s despite resting. He had also developed chills by this point and came into the ED for evaluation. He reports going to bed yesterday in his usual state and felt normal this morning upon waking. There is been no increase in his cough or sputum production, no  chest pain, and no palpitations. He denies lower extremity edema, orthopnea, or PND. He denies sick contacts per se, but notes that he does live with his school-age grandchild. There is been no recent long distance travel and has not been on antibiotics recently.   Hospital Course:  Sepsis, suspected secondary to CAP  -Upon admission, patient was tachypneic, tachycardic, febrile with leukocytosis, and suspected pulmonary etiology  -leukocytosis improving, vital signs stablized -CXR: hyperinflation/emphysema, no definite infiltrate, ?RLL opacity -Blood cultures show no growth to date -strep pneumonia and legionella urine antigens negative -Check urine for strep pneumo and legionella antigens -Patient was given vancomycin and zosyn in the ED -placed onazithromycin and ceftriaxone -Will discharge with azithromycin and ceftin  COPD with acute exacerbation and chronic hypoxic respiratory failure/ PAH -Requires 2 L supplemental O2 during the day, and sometimes 3L at night -Suspect an infectious etiology for the current exacerbation given fever  -Continue solumedrol, scheduled dulera and spiriva, debs PRN -Continue Sildenafil -Follow up with pulmonology  -Discharge with prednisone taper  OSA  -Stable, continue CPAP qHS   GERD -Stable, continue PPI  Normocytic anemia  -Hemoglobin 9.8 -Repeat CBC in one week  Hypertension  -Stable, continue cardizem  Insomnia  -Continue temazepam prn   Pressure sore  -Present on arrival at left buttock -Wound care consulted.   Nasal congestion -Continue nasal saline  Consultants None  Procedures  None  Discharge Exam: Vitals:   07/01/16 0539 07/01/16 0900  BP: (!) 128/58 (!) 128/48  Pulse: 72 78  Resp: 16 18  Temp: 98.7 F (37.1 C) 97.4 F (36.3 C)  Exam  General: Well developed, well nourished, NAD, appears stated age  HEENT: NCAT, mucous membranes moist.   Cardiovascular: S1 S2 auscultated, RRR, no  murmurs  Respiratory: Clear but diminished, no wheezing noted.   Abdomen: Soft, nontender, nondistended, + bowel sounds  Extremities: warm dry without cyanosis clubbing or edema  Neuro: AAOx3, nonfocal  Psych: Normal affect and demeanor with intact judgement and insight, pleasant  Discharge Instructions Discharge Instructions    Discharge instructions    Complete by:  As directed   Patient will be discharged to home.  Patient will need to follow up with primary care provider within one week of discharge, repeat CBC. Follow up with pulmonology. Patient should continue medications as prescribed.  Patient should follow a regualr diet.     Current Discharge Medication List    START taking these medications   Details  azithromycin (ZITHROMAX) 500 MG tablet Take 1 tablet (500 mg total) by mouth daily. Qty: 5 tablet, Refills: 0    cefUROXime (CEFTIN) 500 MG tablet Take 1 tablet (500 mg total) by mouth 2 (two) times daily with a meal. Qty: 10 tablet, Refills: 0    predniSONE (DELTASONE) 10 MG tablet Take (4 tabs) x 2 days, then taper to 30mg  (3 tabs) x 2 days, then 20mg  (2 tabs) x 2days, then 10mg  (1 tab) x 2days Qty: 20 tablet, Refills: 0      CONTINUE these medications which have NOT CHANGED   Details  ADVAIR DISKUS 250-50 MCG/DOSE AEPB Inhale 1 puff into the lungs 2 (two) times daily. Qty: 60 each, Refills: 2   Associated Diagnoses: Pulmonary hypertension ([place]); Other emphysema ([place]); Chronic hypoxemic respiratory failure ([place]); History of pulmonary hypertension; Dyspnea    diltiazem (CARTIA XT) 240 MG 24 hr capsule Take 240 mg by mouth at bedtime.    esomeprazole (NEXIUM) 20 MG capsule Take 20 mg by mouth 2 (two) times daily before a meal.    Associated Diagnoses: Pulmonary hypertension ([place]); Other emphysema ([place]); Chronic hypoxemic respiratory failure ([place]); History of pulmonary hypertension; Dyspnea    finasteride (PROSCAR) 5 MG tablet Take 5 mg by mouth at bedtime.     Associated Diagnoses: Pulmonary hypertension ([place]); Other emphysema ([place]); Chronic hypoxemic respiratory failure ([place]); History of pulmonary hypertension; Dyspnea    ipratropium-albuterol (DUONEB) 0.5-2.5 (3) MG/3ML SOLN Take 3 mLs by nebulization every 6 (six) hours as needed. Dx: J44.9 Qty: 360 mL, Refills: 12   Associated Diagnoses: Centrilobular emphysema ([place]); Chronic hypoxemic respiratory failure ([place]); Pulmonary hypertension (HCC); OSA (obstructive sleep apnea); COPD exacerbation (HCC)    liver oil-zinc oxide (DESITIN) 40 % ointment Apply 1 application topically See admin instructions. Apply to left butt cheek for wound care approximately every 4 hours while awake    Multiple Vitamins-Minerals (MULTIVITAMIN WITH MINERALS) tablet Take 1 tablet by mouth daily.    OVER THE COUNTER MEDICATION Apply 1 application topically See admin instructions. Protective ointment from home health care - apply approximately every 4 hours to left butt cheek for wound care    OXYGEN Inhale 2-3 L into the lungs continuous. 2L daytime, 3L at night    polyethylene glycol (MIRALAX / GLYCOLAX) packet Take 17 g by mouth daily. Qty: 14 each, Refills: 1    rosuvastatin (CRESTOR) 10 MG tablet Take 10 mg by mouth daily.   Associated Diagnoses: Pulmonary hypertension ([place]); Other emphysema ([place]); Chronic hypoxemic respiratory failure ([place]); History of pulmonary hypertension; Dyspnea    sildenafil (REVATIO) 20 MG tablet Take 20 mg by mouth 2 (  two) times daily.    Associated Diagnoses: Pulmonary hypertension ([place]); Other emphysema ([place]); Chronic hypoxemic respiratory failure ([place]); History of pulmonary hypertension; Dyspnea    SPIRIVA HANDIHALER 18 MCG inhalation capsule INHALE THE CONTENTS OF 1 CAPSULE EVERY DAY Qty: 90 capsule, Refills: 2    tamsulosin (FLOMAX) 0.4 MG CAPS capsule Take 1 capsule (0.4 mg total) by mouth daily after breakfast. Qty: 30 capsule, Refills: 0    temazepam (RESTORIL) 30 MG capsule Take  30 mg by mouth at bedtime.   Associated Diagnoses: Pulmonary hypertension ([place]); Other emphysema ([place]); Chronic hypoxemic respiratory failure ([place]); History of pulmonary hypertension; Dyspnea    vitamin C (ASCORBIC ACID) 500 MG tablet Take 500 mg by mouth daily.    VITAMIN E PO Take 1 capsule by mouth daily.    albuterol (PROVENTIL HFA;VENTOLIN HFA) 108 (90 Base) MCG/ACT inhaler Inhale 2 puffs into the lungs every 6 (six) hours as needed for wheezing or shortness of breath. Qty: 1 Inhaler, Refills: 2      STOP taking these medications     ibuprofen (ADVIL,MOTRIN) 200 MG tablet        No Known Allergies Follow-up Information    KOIRALA,DIBAS, MD. Call in 1 week(s).   Specialty:  Family Medicine Why:  Hospital follow up Contact information: [address] [place] [address] [phone]            The results of significant diagnostics from this hospitalization (including imaging, microbiology, ancillary and laboratory) are listed below for reference.    Significant Diagnostic Studies: Dg Chest Portable 1 View  Result Date: 06/28/2016 CLINICAL DATA:  Shortness of breath since 3 p.m. today. Hard to breathe with or without oxygen. EXAM: PORTABLE CHEST 1 VIEW COMPARISON:  05/08/2016 FINDINGS: Lungs are hyperinflated. Marked emphysematous changes are identified at the apices. Crowded lung markings are identified at the bases. No focal consolidations or pleural effusions. IMPRESSION: Hyperinflation/emphysema. No focal acute pulmonary abnormality. Electronically Signed   By: Nolon Nations M.D.   On: 06/28/2016 17:56    Microbiology: Recent Results (from the past 240 hour(s))  Blood culture (routine x 2)     Status: None (Preliminary result)   Collection Time: 06/28/16  6:07 PM  Result Value Ref Range Status   Specimen Description BLOOD LEFT ANTECUBITAL  Final   Special Requests BOTTLES DRAWN AEROBIC AND ANAEROBIC 5ML   Final   Culture NO GROWTH 2 DAYS   Final   Report Status PENDING  Incomplete  Blood culture (routine x 2)     Status: None (Preliminary result)   Collection Time: 06/28/16  6:09 PM  Result Value Ref Range Status   Specimen Description BLOOD RIGHT HAND  Final   Special Requests BOTTLES DRAWN AEROBIC ONLY 5CC  Final   Culture NO GROWTH 2 DAYS  Final   Report Status PENDING  Incomplete  Urine culture     Status: Abnormal   Collection Time: 06/28/16  7:14 PM  Result Value Ref Range Status   Specimen Description URINE, RANDOM  Final   Special Requests NONE  Final   Culture MULTIPLE SPECIES PRESENT, SUGGEST RECOLLECTION (A)  Final   Report Status 06/29/2016 FINAL  Final     Labs: Basic Metabolic Panel:  Recent Labs Lab 06/28/16 1807 06/29/16 0612 07/01/16 0548  NA 139 141 139  K 3.9 3.8 3.8  CL 105 107 107  CO2 26 25 24   GLUCOSE 121* 158* 167*  BUN 14 9 20   CREATININE 1.02 0.78  0.88  CALCIUM 8.9 8.7* 8.9   Liver Function Tests:  Recent Labs Lab 06/28/16 1807  AST 21  ALT 22  ALKPHOS 74  BILITOT 0.3  PROT 6.7  ALBUMIN 4.0   No results for input(s): LIPASE, AMYLASE in the last 168 hours. No results for input(s): AMMONIA in the last 168 hours. CBC:  Recent Labs Lab 06/28/16 1807 07/01/16 0548  WBC 15.1* 12.2*  NEUTROABS 13.3*  --   HGB 11.3* 9.8*  HCT 35.1* 30.8*  MCV 88.2 89.0  PLT 242 181   Cardiac Enzymes:  Recent Labs Lab 06/28/16 1807  TROPONINI <0.03   BNP: BNP (last 3 results)  Recent Labs  01/02/16 1318 06/28/16 1828  BNP 60.0 24.3    ProBNP (last 3 results) No results for input(s): PROBNP in the last 8760 hours.  CBG: No results for input(s): GLUCAP in the last 168 hours.     SignedCristal Ford  [facility] 07/01/2016, 10:36 AM

## 2016-07-01 NOTE — Progress Notes (Signed)
discharge instruction Rx's and follow up appts explained and provided to patient and daughter, verbalized understanding. Left floor via wheelchair accompanied by staff no c/o pain or shortness of breath.  Hilliard Borges, Tivis Ringer, RN

## 2016-07-01 NOTE — Discharge Instructions (Signed)
Chronic Obstructive Pulmonary Disease Chronic obstructive pulmonary disease (COPD) is a common lung condition in which airflow from the lungs is limited. COPD is a general term that can be used to describe many different lung problems that limit airflow, including both chronic bronchitis and emphysema. If you have COPD, your lung function will probably never return to normal, but there are measures you can take to improve lung function and make yourself feel better. CAUSES   Smoking (common).  Exposure to secondhand smoke.  Genetic problems.  Chronic inflammatory lung diseases or recurrent infections. SYMPTOMS  Shortness of breath, especially with physical activity.  Deep, persistent (chronic) cough with a large amount of thick mucus.  Wheezing.  Rapid breaths (tachypnea).  Gray or bluish discoloration (cyanosis) of the skin, especially in your fingers, toes, or lips.  Fatigue.  Weight loss.  Frequent infections or episodes when breathing symptoms become much worse (exacerbations).  Chest tightness. DIAGNOSIS Your health care provider will take a medical history and perform a physical examination to diagnose COPD. Additional tests for COPD may include:  Lung (pulmonary) function tests.  Chest X-ray.  CT scan.  Blood tests. TREATMENT  Treatment for COPD may include:  Inhaler and nebulizer medicines. These help manage the symptoms of COPD and make your breathing more comfortable.  Supplemental oxygen. Supplemental oxygen is only helpful if you have a low oxygen level in your blood.  Exercise and physical activity. These are beneficial for nearly all people with COPD.  Lung surgery or transplant.  Nutrition therapy to gain weight, if you are underweight.  Pulmonary rehabilitation. This may involve working with a team of health care providers and specialists, such as respiratory, occupational, and physical therapists. HOME CARE INSTRUCTIONS  Take all medicines  (inhaled or pills) as directed by your health care provider.  Avoid over-the-counter medicines or cough syrups that dry up your airway (such as antihistamines) and slow down the elimination of secretions unless instructed otherwise by your health care provider.  If you are a smoker, the most important thing that you can do is stop smoking. Continuing to smoke will cause further lung damage and breathing trouble. Ask your health care provider for help with quitting smoking. He or she can direct you to community resources or hospitals that provide support.  Avoid exposure to irritants such as smoke, chemicals, and fumes that aggravate your breathing.  Use oxygen therapy and pulmonary rehabilitation if directed by your health care provider. If you require home oxygen therapy, ask your health care provider whether you should purchase a pulse oximeter to measure your oxygen level at home.  Avoid contact with individuals who have a contagious illness.  Avoid extreme temperature and humidity changes.  Eat healthy foods. Eating smaller, more frequent meals and resting before meals may help you maintain your strength.  Stay active, but balance activity with periods of rest. Exercise and physical activity will help you maintain your ability to do things you want to do.  Preventing infection and hospitalization is very important when you have COPD. Make sure to receive all the vaccines your health care provider recommends, especially the pneumococcal and influenza vaccines. Ask your health care provider whether you need a pneumonia vaccine.  Learn and use relaxation techniques to manage stress.  Learn and use controlled breathing techniques as directed by your health care provider. Controlled breathing techniques include:  Pursed lip breathing. Start by breathing in (inhaling) through your nose for 1 second. Then, purse your lips as if you were  going to whistle and breathe out (exhale) through the  pursed lips for 2 seconds. °¨ Diaphragmatic breathing. Start by putting one hand on your abdomen just above your waist. Inhale slowly through your nose. The hand on your abdomen should move out. Then purse your lips and exhale slowly. You should be able to feel the hand on your abdomen moving in as you exhale. °· Learn and use controlled coughing to clear mucus from your lungs. Controlled coughing is a series of short, progressive coughs. The steps of controlled coughing are: °1. Lean your head slightly forward. °2. Breathe in deeply using diaphragmatic breathing. °3. Try to hold your breath for 3 seconds. °4. Keep your mouth slightly open while coughing twice. °5. Spit any mucus out into a tissue. °6. Rest and repeat the steps once or twice as needed. °SEEK MEDICAL CARE IF: °· You are coughing up more mucus than usual. °· There is a change in the color or thickness of your mucus. °· Your breathing is more labored than usual. °· Your breathing is faster than usual. °SEEK IMMEDIATE MEDICAL CARE IF: °· You have shortness of breath while you are resting. °· You have shortness of breath that prevents you from: °¨ Being able to talk. °¨ Performing your usual physical activities. °· You have chest pain lasting longer than 5 minutes. °· Your skin color is more cyanotic than usual. °· You measure low oxygen saturations for longer than 5 minutes with a pulse oximeter. °MAKE SURE YOU: °· Understand these instructions. °· Will watch your condition. °· Will get help right away if you are not doing well or get worse. °  °This information is not intended to replace advice given to you by your health care provider. Make sure you discuss any questions you have with your health care provider. °  °Document Released: 08/14/2005 Document Revised: 11/25/2014 Document Reviewed: 07/01/2013 °[employer] Interactive Patient Education ©2016 [employer] °Community-Acquired Pneumonia, Adult °Pneumonia is an infection of the lungs. There are  different types of pneumonia. One type can develop while a person is in a hospital. A different type, called community-acquired pneumonia, develops in people who are not, or have not recently been, in the hospital or other health care facility.  °CAUSES °Pneumonia may be caused by bacteria, viruses, or funguses. Community-acquired pneumonia is often caused by Streptococcus pneumonia bacteria. These bacteria are often passed from one person to another by breathing in droplets from the cough or sneeze of an infected person. °RISK FACTORS °The condition is more likely to develop in: °· People who have chronic diseases, such as chronic obstructive pulmonary disease (COPD), asthma, congestive heart failure, cystic fibrosis, diabetes, or kidney disease. °· People who have early-stage or late-stage HIV. °· People who have sickle cell disease. °· People who have had their spleen removed (splenectomy). °· People who have poor dental hygiene. °· People who have medical conditions that increase the risk of breathing in (aspirating) secretions their own mouth and nose.   °· People who have a weakened immune system (immunocompromised). °· People who smoke. °· People who travel to areas where pneumonia-causing germs commonly exist. °· People who are around animal habitats or animals that have pneumonia-causing germs, including birds, bats, rabbits, cats, and farm animals. °SYMPTOMS °Symptoms of this condition include: °· A dry cough. °· A wet (productive) cough. °· Fever. °· Sweating. °· Chest pain, especially when breathing deeply or coughing. °· Rapid breathing or difficulty breathing. °· Shortness of breath. °· Shaking chills. °· Fatigue. °· Muscle aches. °DIAGNOSIS °Your health care provider will take a   medical history and perform a physical exam. You may also have other tests, including:  Imaging studies of your chest, including X-rays.  Tests to check your blood oxygen level and other blood gases.  Other tests on  blood, mucus (sputum), fluid around your lungs (pleural fluid), and urine. If your pneumonia is severe, other tests may be done to identify the specific cause of your illness. TREATMENT The type of treatment that you receive depends on many factors, such as the cause of your pneumonia, the medicines you take, and other medical conditions that you have. For most adults, treatment and recovery from pneumonia may occur at home. In some cases, treatment must happen in a hospital. Treatment may include:  Antibiotic medicines, if the pneumonia was caused by bacteria.  Antiviral medicines, if the pneumonia was caused by a virus.  Medicines that are given by mouth or through an IV tube.  Oxygen.  Respiratory therapy. Although rare, treating severe pneumonia may include:  Mechanical ventilation. This is done if you are not breathing well on your own and you cannot maintain a safe blood oxygen level.  Thoracentesis. This procedureremoves fluid around one lung or both lungs to help you breathe better. HOME CARE INSTRUCTIONS  Take over-the-counter and prescription medicines only as told by your health care provider.  Only takecough medicine if you are losing sleep. Understand that cough medicine can prevent your body's natural ability to remove mucus from your lungs.  If you were prescribed an antibiotic medicine, take it as told by your health care provider. Do not stop taking the antibiotic even if you start to feel better.  Sleep in a semi-upright position at night. Try sleeping in a reclining chair, or place a few pillows under your head.  Do not use tobacco products, including cigarettes, chewing tobacco, and e-cigarettes. If you need help quitting, ask your health care provider.  Drink enough water to keep your urine clear or pale yellow. This will help to thin out mucus secretions in your lungs. PREVENTION There are ways that you can decrease your risk of developing community-acquired  pneumonia. Consider getting a pneumococcal vaccine if:  You are older than 77 years of age.  You are older than 77 years of age and are undergoing cancer treatment, have chronic lung disease, or have other medical conditions that affect your immune system. Ask your health care provider if this applies to you. There are different types and schedules of pneumococcal vaccines. Ask your health care provider which vaccination option is best for you. You may also prevent community-acquired pneumonia if you take these actions:  Get an influenza vaccine every year. Ask your health care provider which type of influenza vaccine is best for you.  Go to the dentist on a regular basis.  Wash your hands often. Use hand sanitizer if soap and water are not available. SEEK MEDICAL CARE IF:  You have a fever.  You are losing sleep because you cannot control your cough with cough medicine. SEEK IMMEDIATE MEDICAL CARE IF:  You have worsening shortness of breath.  You have increased chest pain.  Your sickness becomes worse, especially if you are an older adult or have a weakened immune system.  You cough up blood.   This information is not intended to replace advice given to you by your health care provider. Make sure you discuss any questions you have with your health care provider.   Document Released: 11/04/2005 Document Revised: 07/26/2015 Document Reviewed: 03/01/2015 [employer] Interactive Patient  Education ©2016 [employer] ° °

## 2016-07-02 ENCOUNTER — Encounter (HOSPITAL_COMMUNITY): Payer: Self-pay

## 2016-07-02 ENCOUNTER — Encounter (HOSPITAL_COMMUNITY)
Admission: RE | Admit: 2016-07-02 | Discharge: 2016-07-02 | Disposition: A | Discharge status: Home / Self Care | Payer: Medicare Other | Source: Ambulatory Visit | Attending: Family Medicine | Admitting: Family Medicine

## 2016-07-02 DIAGNOSIS — J439 Emphysema, unspecified: Secondary | ICD-10-CM

## 2016-07-02 NOTE — Progress Notes (Signed)
Pulmonary Individual Treatment Plan  Patient Details  Name: Andrew [name] MRN: [id number] Date of Birth: 1941-06-16 Referring Provider:   [name] Pulmonary Rehab Walk Test from 05/02/2016 in [place]  Referring Provider  Dr. Dorthy Cooler      Initial Encounter Date:  Flowsheet Row Pulmonary Rehab Walk Test from 05/02/2016 in [place]  Date  05/02/16  Referring Provider  Dr. Dorthy Cooler      Visit Diagnosis: Pulmonary emphysema, unspecified emphysema type ([place])  Patient's Home Medications on Admission:   Current Outpatient Prescriptions:  .  ADVAIR DISKUS 250-50 MCG/DOSE AEPB, Inhale 1 puff into the lungs 2 (two) times daily., Disp: 60 each, Rfl: 2 .  albuterol (PROVENTIL HFA;VENTOLIN HFA) 108 (90 Base) MCG/ACT inhaler, Inhale 2 puffs into the lungs every 6 (six) hours as needed for wheezing or shortness of breath. (Patient not taking: Reported on 06/28/2016), Disp: 1 Inhaler, Rfl: 2 .  azithromycin (ZITHROMAX) 500 MG tablet, Take 1 tablet (500 mg total) by mouth daily., Disp: 5 tablet, Rfl: 0 .  cefUROXime (CEFTIN) 500 MG tablet, Take 1 tablet (500 mg total) by mouth 2 (two) times daily with a meal., Disp: 10 tablet, Rfl: 0 .  diltiazem (CARTIA XT) 240 MG 24 hr capsule, Take 240 mg by mouth at bedtime., Disp: , Rfl:  .  esomeprazole (NEXIUM) 20 MG capsule, Take 20 mg by mouth 2 (two) times daily before a meal. , Disp: , Rfl:  .  finasteride (PROSCAR) 5 MG tablet, Take 5 mg by mouth at bedtime. , Disp: , Rfl:  .  ipratropium-albuterol (DUONEB) 0.5-2.5 (3) MG/3ML SOLN, Take 3 mLs by nebulization every 6 (six) hours as needed. Dx: J44.9 (Patient taking differently: Take 3 mLs by nebulization every 6 (six) hours as needed (shortness of breath/ wheezing). Dx: J44.9), Disp: 360 mL, Rfl: 12 .  liver oil-zinc oxide (DESITIN) 40 % ointment, Apply 1 application topically See admin instructions. Apply to left butt cheek for wound care  approximately every 4 hours while awake, Disp: , Rfl:  .  Multiple Vitamins-Minerals (MULTIVITAMIN WITH MINERALS) tablet, Take 1 tablet by mouth daily., Disp: , Rfl:  .  OVER THE COUNTER MEDICATION, Apply 1 application topically See admin instructions. Protective ointment from home health care - apply approximately every 4 hours to left butt cheek for wound care, Disp: , Rfl:  .  OXYGEN, Inhale 2-3 L into the lungs continuous. 2L daytime, 3L at night, Disp: , Rfl:  .  polyethylene glycol (MIRALAX / GLYCOLAX) packet, Take 17 g by mouth daily. (Patient taking differently: Take 17 g by mouth at bedtime. Mix in 8 oz liquid and drink), Disp: 14 each, Rfl: 1 .  predniSONE (DELTASONE) 10 MG tablet, Take (4 tabs) x 2 days, then taper to 30mg  (3 tabs) x 2 days, then 20mg  (2 tabs) x 2days, then 10mg  (1 tab) x 2days, Disp: 20 tablet, Rfl: 0 .  rosuvastatin (CRESTOR) 10 MG tablet, Take 10 mg by mouth daily., Disp: , Rfl:  .  sildenafil (REVATIO) 20 MG tablet, Take 20 mg by mouth 2 (two) times daily. , Disp: , Rfl:  .  SPIRIVA HANDIHALER 18 MCG inhalation capsule, INHALE THE CONTENTS OF 1 CAPSULE EVERY DAY (Patient taking differently: INHALE THE CONTENTS OF 1 CAPSULE EVERY DAY AT NOON), Disp: 90 capsule, Rfl: 2 .  tamsulosin (FLOMAX) 0.4 MG CAPS capsule, Take 1 capsule (0.4 mg total) by mouth daily after breakfast., Disp: 30 capsule, Rfl: 0 .  temazepam (RESTORIL) 30 MG capsule, Take 30 mg by mouth at bedtime., Disp: , Rfl:  .  vitamin C (ASCORBIC ACID) 500 MG tablet, Take 500 mg by mouth daily., Disp: , Rfl:  .  VITAMIN E PO, Take 1 capsule by mouth daily., Disp: , Rfl:   Past Medical History: Past Medical History:  Diagnosis Date  . Emphysema lung ([place])   . Hypertension   . Pulmonary hypertension ([facility])     Tobacco Use: History  Smoking Status  . Former Smoker  . Packs/day: 0.00  . Years: 0.00  . Types: Cigarettes  . Quit date: 10/01/1984  Smokeless Tobacco  . Never Used    Labs: Recent Review  Flowsheet Data    Labs for ITP Cardiac and Pulmonary Rehab Latest Ref Rng & Units 01/02/2016 01/10/2016 06/28/2016   Hemoglobin A1c 4.8 - 5.6 % 6.2(H) - -   PHART 7.350 - 7.450 - - 7.444   PCO2ART 35.0 - 45.0 mmHg - - 35.2   HCO3 20.0 - 24.0 mEq/L - - 24.1(H)   TCO2 0 - 100 mmol/L - 26 25   O2SAT % - - 94.0      Capillary Blood Glucose: No results found for: GLUCAP   ADL UCSD:     Pulmonary Assessment Scores    Row Name 05/09/16 0756         ADL UCSD   ADL Phase Entry     SOB Score total 66        Pulmonary Function Assessment:   Exercise Target Goals:    Exercise Program Goal: Individual exercise prescription set with THRR, safety & activity barriers. Participant demonstrates ability to understand and report RPE using BORG scale, to self-measure pulse accurately, and to acknowledge the importance of the exercise prescription.  Exercise Prescription Goal: Starting with aerobic activity 30 plus minutes a day, 3 days per week for initial exercise prescription. Provide home exercise prescription and guidelines that participant acknowledges understanding prior to discharge.  Activity Barriers & Risk Stratification:   6 Minute Walk:   Initial Exercise Prescription:   Perform Capillary Blood Glucose checks as needed.  Exercise Prescription Changes:     Exercise Prescription Changes    Row Name 05/09/16 1200 05/14/16 1200 05/16/16 1600 05/23/16 1200 05/28/16 1200     Exercise Review   Progression  -  - Yes Yes  -     Response to Exercise   Blood Pressure (Admit) 100/50 100/46 124/60 118/72 100/50   Blood Pressure (Exercise) 130/60 132/68 120/60 138/64 136/58   Blood Pressure (Exit) 104/60 102/60 110/60 98/60 100/60   Heart Rate (Admit) 87 bpm 92 bpm 80 bpm 87 bpm 68 bpm   Heart Rate (Exercise) 103 bpm 111 bpm 108 bpm 114 bpm 103 bpm   Heart Rate (Exit) 90 bpm 93 bpm 88 bpm 89 bpm 76 bpm   Oxygen Saturation (Admit) 98 % 99 % 96 % 98 % 98 %   Oxygen Saturation  (Exercise) 94 % 92 % 88 % 89 % 92 %   Oxygen Saturation (Exit) 100 % 100 % 88 % 95 % 98 %   Rating of Perceived Exertion (Exercise) 13 12 13 13 13    Perceived Dyspnea (Exercise) 2 1 3 2 2    Duration Progress to 45 minutes of aerobic exercise without signs/symptoms of physical distress Progress to 45 minutes of aerobic exercise without signs/symptoms of physical distress Progress to 45 minutes of aerobic exercise without signs/symptoms of physical distress Progress to 45  minutes of aerobic exercise without signs/symptoms of physical distress Progress to 45 minutes of aerobic exercise without signs/symptoms of physical distress   Intensity THRR unchanged THRR unchanged THRR unchanged THRR unchanged THRR unchanged     Progression   Progression Continue progressive overload as per policy without signs/symptoms or physical distress. Continue progressive overload as per policy without signs/symptoms or physical distress. Continue progressive overload as per policy without signs/symptoms or physical distress. Continue to progress workloads to maintain intensity without signs/symptoms of physical distress. Continue to progress workloads to maintain intensity without signs/symptoms of physical distress.     Resistance Training   Training Prescription Yes Yes Yes Yes Yes   Weight orange bands orange bands orange bands orange bands orange bands   Reps 10-12 10-12 10-12 10-12 10-12     Interval Training   Interval Training  -  - Yes No No     Oxygen   Oxygen Continuous Continuous Continuous Continuous Continuous   Liters 2 2 2 2 2      Bike   Level 0.3 0.3 0.3 0.4 0.3   Minutes 15 17 17 17 17      NuStep   Level  - 1 3 4 4    Minutes  - 17 17 17 17    METs  - 1.6 1.9 2.4 2.3     Track   Laps 8 12  -  - 11   Minutes 15 17  -  - 17   Row Name 05/30/16 1200 06/04/16 1200 06/06/16 1300 06/11/16 1200 06/13/16 1254     Response to Exercise   Blood Pressure (Admit) 108/50 110/54 110/54 104/60 104/60    Blood Pressure (Exercise) 108/60 124/50 136/78 132/70 148/64   Blood Pressure (Exit) 94/50 100/60 102/50 118/66 118/60   Heart Rate (Admit) 73 bpm 73 bpm 75 bpm 79 bpm 72 bpm   Heart Rate (Exercise) 104 bpm 94 bpm 99 bpm 114 bpm 85 bpm   Heart Rate (Exit) 76 bpm 72 bpm 78 bpm 80 bpm 78 bpm   Oxygen Saturation (Admit) 98 % 100 % 99 % 99 % 100 %   Oxygen Saturation (Exercise) 92 % 94 % 94 % 92 % 96 %   Oxygen Saturation (Exit) 99 % 99 % 99 % 99 % 97 %   Rating of Perceived Exertion (Exercise) 11 11 11 12 11    Perceived Dyspnea (Exercise) 1 1 1 1 1    Duration Progress to 45 minutes of aerobic exercise without signs/symptoms of physical distress Progress to 45 minutes of aerobic exercise without signs/symptoms of physical distress Progress to 45 minutes of aerobic exercise without signs/symptoms of physical distress Progress to 45 minutes of aerobic exercise without signs/symptoms of physical distress Progress to 45 minutes of aerobic exercise without signs/symptoms of physical distress   Intensity THRR unchanged THRR unchanged THRR unchanged THRR unchanged THRR unchanged     Progression   Progression Continue to progress workloads to maintain intensity without signs/symptoms of physical distress. Continue to progress workloads to maintain intensity without signs/symptoms of physical distress. Continue to progress workloads to maintain intensity without signs/symptoms of physical distress. Continue to progress workloads to maintain intensity without signs/symptoms of physical distress. Continue to progress workloads to maintain intensity without signs/symptoms of physical distress.     Resistance Training   Training Prescription Yes Yes Yes Yes Yes   Weight orange bands orange bands orange bands orange bands orange bands   Reps 10-12 10-12  10 minutes of strength training 10-12  10 minutes of strength training 10-12  10 minutes of strength training 10-12  10 minutes of strength training      Interval Training   Interval Training No  -  - No No     Oxygen   Oxygen Continuous Continuous Continuous Continuous Continuous   Liters 2 2 2 2 2      Bike   Level 0.4 0.4  - 0.4 0.4   Minutes 17 17  - 17 17     NuStep   Level  - 4 4 4 4    Minutes  - 17 17 17 17    METs  - 2.9 2.2 2.5 2.4     Track   Laps 14 14 16 15   -   Minutes 17 17 17 17   -     Home Exercise Plan   Plans to continue exercise at  -  - Home  -  -   Frequency  -  - Add 3 additional days to program exercise sessions.  -  -   Row Name 06/18/16 1200 06/25/16 1200 06/27/16 1200         Response to Exercise   Blood Pressure (Admit) 106/60 98/62 116/60     Blood Pressure (Exercise) 122/46 120/50 140/66     Blood Pressure (Exit) 106/70 100/58 90/50  gave gatoraide recheck BP 108/60     Heart Rate (Admit) 82 bpm 66 bpm 77 bpm     Heart Rate (Exercise) 98 bpm 101 bpm 115 bpm     Heart Rate (Exit) 80 bpm 76 bpm 71 bpm     Oxygen Saturation (Admit) 98 % 98 % 98 %     Oxygen Saturation (Exercise) 94 % 96 % 93 %     Oxygen Saturation (Exit) 100 % 100 % 96 %     Rating of Perceived Exertion (Exercise) 11 11 11      Perceived Dyspnea (Exercise) 1 1 2      Duration Progress to 45 minutes of aerobic exercise without signs/symptoms of physical distress Progress to 45 minutes of aerobic exercise without signs/symptoms of physical distress Progress to 45 minutes of aerobic exercise without signs/symptoms of physical distress     Intensity THRR unchanged THRR unchanged THRR unchanged       Progression   Progression Continue to progress workloads to maintain intensity without signs/symptoms of physical distress. Continue to progress workloads to maintain intensity without signs/symptoms of physical distress. Continue to progress workloads to maintain intensity without signs/symptoms of physical distress.       Resistance Training   Training Prescription Yes Yes Yes     Weight orange bands orange bands orange bands     Reps  10-12  10 minutes of strength training 10-12  10 minutes of strength training 10-12  10 minutes of strength training       Interval Training   Interval Training No No No       Oxygen   Oxygen Continuous Continuous Continuous     Liters 2 2 2        Bike   Level 0.4 0.4  -     Minutes 17 17  -       NuStep   Level 4 4 4      Minutes 17 17 17      METs 2.4 2.3 2.4       Track   Laps 15 14 15      Minutes [id number]  Exercise Comments:     Exercise Comments    Row Name 05/30/16 [id number] 06/06/16 1410 07/02/16 0733       Exercise Comments Patient is tolerating workload increases and is showing improvement in stamina and strength home exercise completed Patient is progressing well at Rehab. Patient is walking at home on his days off from rehab. Will cont. to monitor.         Discharge Exercise Prescription (Final Exercise Prescription Changes):     Exercise Prescription Changes - 06/27/16 1200      Response to Exercise   Blood Pressure (Admit) 116/60   Blood Pressure (Exercise) 140/66   Blood Pressure (Exit) 90/50  gave gatoraide recheck BP 108/60   Heart Rate (Admit) 77 bpm   Heart Rate (Exercise) 115 bpm   Heart Rate (Exit) 71 bpm   Oxygen Saturation (Admit) 98 %   Oxygen Saturation (Exercise) 93 %   Oxygen Saturation (Exit) 96 %   Rating of Perceived Exertion (Exercise) 11   Perceived Dyspnea (Exercise) 2   Duration Progress to 45 minutes of aerobic exercise without signs/symptoms of physical distress   Intensity THRR unchanged     Progression   Progression Continue to progress workloads to maintain intensity without signs/symptoms of physical distress.     Resistance Training   Training Prescription Yes   Weight orange bands   Reps 10-12  10 minutes of strength training     Interval Training   Interval Training No     Oxygen   Oxygen Continuous   Liters 2     NuStep   Level 4   Minutes 17   METs 2.4     Track   Laps 15   Minutes 17        Nutrition:  Target Goals: Understanding of nutrition guidelines, daily intake of sodium 1500mg , cholesterol 200mg , calories 30% from fat and 7% or less from saturated fats, daily to have 5 or more servings of fruits and vegetables.  Biometrics:    Nutrition Therapy Plan and Nutrition Goals:   Nutrition Discharge: Rate Your Plate Scores:   Psychosocial: Target Goals: Acknowledge presence or absence of depression, maximize coping skills, provide positive support system. Participant is able to verbalize types and ability to use techniques and skills needed for reducing stress and depression.  Initial Review & Psychosocial Screening:   Quality of Life Scores:     Quality of Life - 05/09/16 0755      Quality of Life Scores   Health/Function Pre 21.83 %   Socioeconomic Pre 24.43 %   Psych/Spiritual Pre 22.5 %   Family Pre 25.5 %   GLOBAL Pre 22.97 %      PHQ-9: Recent Review Flowsheet Data    Depression screen [facility] 2/9 06/20/2016 04/29/2016   Decreased Interest 0 0   Down, Depressed, Hopeless 0 0   PHQ - 2 Score 0 0      Psychosocial Evaluation and Intervention:   Psychosocial Re-Evaluation:     Psychosocial Re-Evaluation    Row Name 05/30/16 0716 07/01/16 1002           Psychosocial Re-Evaluation   Interventions Encouraged to attend Pulmonary Rehabilitation for the exercise Encouraged to attend Pulmonary Rehabilitation for the exercise      Comments no psychosocial barriers identified within his first 30 days no psychosocial barriers identified during the last 30 days        Education: Education Goals: Education classes will be provided on a weekly basis,  covering required topics. Participant will state understanding/return demonstration of topics presented.  Learning Barriers/Preferences:   Education Topics: Risk Factor Reduction:  -Group instruction that is supported by a PowerPoint presentation. Instructor discusses the definition of a risk  factor, different risk factors for pulmonary disease, and how the heart and lungs work together.     Nutrition for Pulmonary Patient:  -Group instruction provided by PowerPoint slides, verbal discussion, and written materials to support subject matter. The instructor gives an explanation and review of healthy diet recommendations, which includes a discussion on weight management, recommendations for fruit and vegetable consumption, as well as protein, fluid, caffeine, fiber, sodium, sugar, and alcohol. Tips for eating when patients are short of breath are discussed. Flowsheet Row PULMONARY REHAB OTHER RESPIRATORY from 06/27/2016 in [name]  Date  06/27/16  Educator  edna  Instruction Review Code  2- meets goals/outcomes      Pursed Lip Breathing:  -Group instruction that is supported by demonstration and informational handouts. Instructor discusses the benefits of pursed lip and diaphragmatic breathing and detailed demonstration on how to preform both.   Flowsheet Row PULMONARY REHAB OTHER RESPIRATORY from 06/27/2016 in [place]  Date  06/13/16  Educator  EP  Instruction Review Code  2- meets goals/outcomes      Oxygen Safety:  -Group instruction provided by PowerPoint, verbal discussion, and written material to support subject matter. There is an overview of "What is Oxygen" and "Why do we need it".  Instructor also reviews how to create a safe environment for oxygen use, the importance of using oxygen as prescribed, and the risks of noncompliance. There is a brief discussion on traveling with oxygen and resources the patient may utilize.   Oxygen Equipment:  -Group instruction provided by [facility] Staff utilizing handouts, written materials, and equipment demonstrations. Flowsheet Row PULMONARY REHAB OTHER RESPIRATORY from 05/30/2016 in [place]  Date  05/16/16  Educator  [employer]   Instruction Review Code  2- meets goals/outcomes      Signs and Symptoms:  -Group instruction provided by written material and verbal discussion to support subject matter. Warning signs and symptoms of infection, stroke, and heart attack are reviewed and when to call the physician/911 reinforced. Tips for preventing the spread of infection discussed.   Advanced Directives:  -Group instruction provided by verbal instruction and written material to support subject matter. Instructor reviews Advanced Directive laws and proper instruction for filling out document.   Pulmonary Video:  -Group video education that reviews the importance of medication and oxygen compliance, exercise, good nutrition, pulmonary hygiene, and pursed lip and diaphragmatic breathing for the pulmonary patient.   Exercise for the Pulmonary Patient:  -Group instruction that is supported by a PowerPoint presentation. Instructor discusses benefits of exercise, core components of exercise, frequency, duration, and intensity of an exercise routine, importance of utilizing pulse oximetry during exercise, safety while exercising, and options of places to exercise outside of rehab.   Flowsheet Row PULMONARY REHAB OTHER RESPIRATORY from 05/30/2016 in [place]  Date  05/23/16  Educator  EP  Instruction Review Code  2- meets goals/outcomes      Pulmonary Medications:  -Verbally interactive group education provided by instructor with focus on inhaled medications and proper administration.   Anatomy and Physiology of the Respiratory System and Intimacy:  -Group instruction provided by PowerPoint, verbal discussion, and written material to support subject matter. Instructor  reviews respiratory cycle and anatomical components of the respiratory system and their functions. Instructor also reviews differences in obstructive and restrictive respiratory diseases with examples of each. Intimacy, Sex, and  Sexuality differences are reviewed with a discussion on how relationships can change when diagnosed with pulmonary disease. Common sexual concerns are reviewed. Flowsheet Row PULMONARY REHAB OTHER RESPIRATORY from 06/27/2016 in [place]  Date  06/06/16  Educator  RN  Instruction Review Code  2- meets goals/outcomes      Knowledge Questionnaire Score:     Knowledge Questionnaire Score - 05/09/16 0754      Knowledge Questionnaire Score   Pre Score 10/13      Core Components/Risk Factors/Patient Goals at Admission:   Core Components/Risk Factors/Patient Goals Review:      Goals and Risk Factor Review    Row Name 05/30/16 0717 07/01/16 1002           Core Components/Risk Factors/Patient Goals Review   Personal Goals Review Increase Strength and Stamina;Develop more efficient breathing techniques such as purse lipped breathing and diaphragmatic breathing and practicing self-pacing with activity.;Improve shortness of breath with ADL's Increase Strength and Stamina;Develop more efficient breathing techniques such as purse lipped breathing and diaphragmatic breathing and practicing self-pacing with activity.;Improve shortness of breath with ADL's      Review see comment section on ITP see comment section on ITP      Expected Outcomes see admission expected outcomes see admission expected outcomes         Core Components/Risk Factors/Patient Goals at Discharge (Final Review):      Goals and Risk Factor Review - 07/01/16 1002      Core Components/Risk Factors/Patient Goals Review   Personal Goals Review Increase Strength and Stamina;Develop more efficient breathing techniques such as purse lipped breathing and diaphragmatic breathing and practicing self-pacing with activity.;Improve shortness of breath with ADL's   Review see comment section on ITP   Expected Outcomes see admission expected outcomes      ITP Comments:   Comments: ITP  REVIEW Pt is making expected progress toward personal goals after completing 13 sessions. He has had a hospitalization for pneumonia and was discharged today. Will follow-up with patient via telephone. Will recommend continued exercise, life style modification, education, and utilization of breathing techniques once cleared to resume exercise inorder to increase stamina and strength and decrease shortness of breath with exertion.

## 2016-07-03 LAB — CULTURE, BLOOD (ROUTINE X 2)
CULTURE: NO GROWTH
Culture: NO GROWTH

## 2016-07-04 ENCOUNTER — Encounter (HOSPITAL_COMMUNITY): Admission: RE | Admit: 2016-07-04 | Payer: Medicare Other | Source: Ambulatory Visit

## 2016-07-09 ENCOUNTER — Encounter (HOSPITAL_COMMUNITY): Payer: Medicare Other

## 2016-07-10 DIAGNOSIS — J439 Emphysema, unspecified: Secondary | ICD-10-CM | POA: Diagnosis not present

## 2016-07-10 DIAGNOSIS — Z79899 Other long term (current) drug therapy: Secondary | ICD-10-CM | POA: Diagnosis not present

## 2016-07-10 DIAGNOSIS — I10 Essential (primary) hypertension: Secondary | ICD-10-CM | POA: Diagnosis not present

## 2016-07-10 DIAGNOSIS — G4700 Insomnia, unspecified: Secondary | ICD-10-CM | POA: Diagnosis not present

## 2016-07-10 DIAGNOSIS — L89151 Pressure ulcer of sacral region, stage 1: Secondary | ICD-10-CM | POA: Diagnosis not present

## 2016-07-11 ENCOUNTER — Encounter (HOSPITAL_COMMUNITY)
Admission: RE | Admit: 2016-07-11 | Discharge: 2016-07-11 | Disposition: A | Discharge status: Home / Self Care | Payer: Medicare Other | Source: Ambulatory Visit | Attending: Family Medicine | Admitting: Family Medicine

## 2016-07-11 VITALS — Wt 133.6 lb

## 2016-07-11 DIAGNOSIS — I272 Other secondary pulmonary hypertension: Secondary | ICD-10-CM | POA: Diagnosis not present

## 2016-07-11 DIAGNOSIS — Z87891 Personal history of nicotine dependence: Secondary | ICD-10-CM | POA: Diagnosis not present

## 2016-07-11 DIAGNOSIS — J439 Emphysema, unspecified: Secondary | ICD-10-CM

## 2016-07-11 DIAGNOSIS — Z79899 Other long term (current) drug therapy: Secondary | ICD-10-CM | POA: Diagnosis not present

## 2016-07-11 NOTE — Progress Notes (Signed)
Daily Session Note  Patient Details  Name: Andrew [name] MRN: [id number] Date of Birth: 08-24-1941 Referring Provider:   [name] Pulmonary Rehab Walk Test from 05/02/2016 in [place]  Referring Provider  Dr. Dorthy Cooler      Encounter Date: 07/11/2016  Check In:     Session Check In - 07/11/16 1024      Check-In   Location [facility]   Staff Present Su Hilt, MS, ACSM RCEP, Exercise Physiologist;Annedrea Stackhouse, RN, MHA;Tomasz Steeves Rollene Rotunda, RN, Roque Cash, RN   Supervising physician immediately available to respond to emergencies [facility] immediately available   Physician(s) Dr. Allyson Sabal   Medication changes reported     No   Fall or balance concerns reported    No   Warm-up and Cool-down Performed as group-led instruction   Resistance Training Performed Yes   VAD Patient? No     Pain Assessment   Currently in Pain? No/denies   Multiple Pain Sites No      Capillary Blood Glucose: No results found for this or any previous visit (from the past 24 hour(s)).      Exercise Prescription Changes - 07/11/16 1259      Response to Exercise   Blood Pressure (Admit) 108/50   Blood Pressure (Exercise) 110/58   Blood Pressure (Exit) 100/56  gave gatoraide recheck BP 108/60   Heart Rate (Admit) 85 bpm   Heart Rate (Exercise) 96 bpm   Heart Rate (Exit) 81 bpm   Oxygen Saturation (Admit) 97 %   Oxygen Saturation (Exercise) 97 %   Oxygen Saturation (Exit) 100 %   Rating of Perceived Exertion (Exercise) 12   Perceived Dyspnea (Exercise) 3   Duration Progress to 45 minutes of aerobic exercise without signs/symptoms of physical distress   Intensity THRR unchanged     Progression   Progression Continue to progress workloads to maintain intensity without signs/symptoms of physical distress.     Resistance Training   Training Prescription Yes   Weight orange bands   Reps 10-12  10 minutes of strength training      Interval Training   Interval Training No     Oxygen   Oxygen Continuous   Liters 2     NuStep   Level 4   Minutes 17     Track   Laps 10   Minutes 17     Goals Met:  Improved SOB with ADL's Using PLB without cueing & demonstrates good technique Exercise tolerated well No report of cardiac concerns or symptoms Strength training completed today  Goals Unmet:  Not Applicable  Comments: Service time is from 1030 to 1235   Dr. Rush Farmer is Medical Director for Pulmonary Rehab at [facility].

## 2016-07-16 ENCOUNTER — Encounter (HOSPITAL_COMMUNITY)
Admission: RE | Admit: 2016-07-16 | Discharge: 2016-07-16 | Disposition: A | Discharge status: Home / Self Care | Payer: Medicare Other | Source: Ambulatory Visit | Attending: Family Medicine | Admitting: Family Medicine

## 2016-07-16 ENCOUNTER — Other Ambulatory Visit (HOSPITAL_COMMUNITY): Payer: Self-pay | Admitting: Physician Assistant

## 2016-07-16 VITALS — Wt 133.6 lb

## 2016-07-16 DIAGNOSIS — R1310 Dysphagia, unspecified: Secondary | ICD-10-CM

## 2016-07-16 DIAGNOSIS — G4733 Obstructive sleep apnea (adult) (pediatric): Secondary | ICD-10-CM | POA: Diagnosis not present

## 2016-07-16 DIAGNOSIS — Z79899 Other long term (current) drug therapy: Secondary | ICD-10-CM | POA: Diagnosis not present

## 2016-07-16 DIAGNOSIS — Z9989 Dependence on other enabling machines and devices: Secondary | ICD-10-CM | POA: Diagnosis not present

## 2016-07-16 DIAGNOSIS — Z9981 Dependence on supplemental oxygen: Secondary | ICD-10-CM | POA: Diagnosis not present

## 2016-07-16 DIAGNOSIS — J342 Deviated nasal septum: Secondary | ICD-10-CM | POA: Diagnosis not present

## 2016-07-16 DIAGNOSIS — I272 Other secondary pulmonary hypertension: Secondary | ICD-10-CM | POA: Diagnosis not present

## 2016-07-16 DIAGNOSIS — Z87891 Personal history of nicotine dependence: Secondary | ICD-10-CM | POA: Diagnosis not present

## 2016-07-16 DIAGNOSIS — J439 Emphysema, unspecified: Secondary | ICD-10-CM | POA: Diagnosis not present

## 2016-07-16 DIAGNOSIS — K219 Gastro-esophageal reflux disease without esophagitis: Secondary | ICD-10-CM | POA: Diagnosis not present

## 2016-07-16 NOTE — Progress Notes (Signed)
Daily Session Note  Patient Details  Name: Andrew [name] MRN: [id number] Date of Birth: 02-13-1941 Referring Provider:   [name] Pulmonary Rehab Walk Test from 05/02/2016 in [place]  Referring Provider  Dr. Dorthy Cooler      Encounter Date: 07/16/2016  Check In:     Session Check In - 07/16/16 1059      Check-In   Location [facility]   Staff Present Rosebud Poles, RN, BSN;Molly diVincenzo, MS, ACSM RCEP, Exercise Physiologist;Lisa Ysidro Evert, Felipe Drone, RN, MHA;Alexus Michael Rollene Rotunda, RN, BSN   Supervising physician immediately available to respond to emergencies [facility] immediately available   Physician(s) Dr. Marily Memos   Medication changes reported     No   Fall or balance concerns reported    No   Warm-up and Cool-down Performed as group-led instruction   Resistance Training Performed Yes   VAD Patient? No     Pain Assessment   Currently in Pain? No/denies   Multiple Pain Sites No      Capillary Blood Glucose: No results found for this or any previous visit (from the past 24 hour(s)).      Exercise Prescription Changes - 07/16/16 1223      Response to Exercise   Blood Pressure (Admit) 110/60   Blood Pressure (Exercise) 116/58   Blood Pressure (Exit) 100/54   Heart Rate (Admit) 83 bpm   Heart Rate (Exercise) 100 bpm   Heart Rate (Exit) 78 bpm   Oxygen Saturation (Admit) 98 %   Oxygen Saturation (Exercise) 95 %   Oxygen Saturation (Exit) 100 %   Rating of Perceived Exertion (Exercise) 11   Perceived Dyspnea (Exercise) 0   Duration Progress to 45 minutes of aerobic exercise without signs/symptoms of physical distress   Intensity THRR unchanged     Progression   Progression Continue to progress workloads to maintain intensity without signs/symptoms of physical distress.     Resistance Training   Training Prescription Yes   Weight orange bands   Reps 10-12  10 minutes of strength training     Interval Training   Interval Training No     Oxygen   Oxygen Continuous   Liters 2     NuStep   Level 4   Minutes 34   METs 1.8     Track   Laps 6   Minutes 17     Goals Met:  Improved SOB with ADL's Exercise tolerated well Queuing for purse lip breathing No report of cardiac concerns or symptoms Strength training completed today  Goals Unmet:  Not Applicable  Comments: Service time is from 1030 to 1200   Dr. Rush Farmer is Medical Director for Pulmonary Rehab at [facility].

## 2016-07-16 NOTE — Progress Notes (Signed)
Andrew [name] 75 y.o. male Nutrition Note Spoke with pt. Pt is at increased nutrition risk for a pulmonary pt due to BMI < 21. Pt is 24 lb below his reported UBW of 160 lb.  Pt eats 4-5 meals a day. Pt's Rate Your Plate results not reviewed with pt due to pt's need to gain wt. Pt educated re: high calorie, high protein diet. Pt is pre-diabetic according to his last A1c. Pt was unaware of pre-diabetes. Pre-diabetes discussed. Pt encouraged to focus on wt gain as the first priority and to discuss pre-diabetes/further testing with his PCP. Pt expressed understanding of the information reviewed via feedback method.    Lab Results  Component Value Date   HGBA1C 6.2 (H) 01/02/2016   Nutrition Diagnosis ? Food-and nutrition-related knowledge deficit related to lack of exposure to information as related to diagnosis of pulmonary disease ? Increased energy expenditure related to increased energy requirements during pna as evidenced by BMI <21 and recent h/o wt loss. Nutrition Intervention ? Pt's individual nutrition plan and goals reviewed with pt. ? Benefits of adopting healthy eating habits discussed when pt's Rate Your Plate reviewed. ? Pt given handouts for: ? Pre-diabetes; High Calorie, High Protein diet, Suggestions for increasing kcal and prot; and High Calorie, High Protein recipes ? Pt to attend the Nutrition and Lung Disease class ? Continual client-centered nutrition education by RD, as part of interdisciplinary care. Goal(s) 1. The pt will recognize symptoms that can interfere with adequate oral intake, such as shortness of breath, N/V, early satiety, fatigue, ability to secure and prepare food, taste and smell changes, chewing/swallowing difficulties, and/ or pain when eating. 2. Identify food quantities necessary to achieve wt loss of 1 -2# per week to a goal wt loss of 2.7-10.9 kg (6-24 lb) at graduation from pulmonary rehab. Monitor and Evaluate progress toward nutrition goal with team.    Derek Mound, M.Ed, RD, LDN, CDE 07/16/2016 3:18 PM

## 2016-07-17 ENCOUNTER — Other Ambulatory Visit (INDEPENDENT_AMBULATORY_CARE_PROVIDER_SITE_OTHER): Payer: Medicare Other

## 2016-07-17 ENCOUNTER — Encounter: Payer: Self-pay | Admitting: Adult Health

## 2016-07-17 ENCOUNTER — Telehealth: Payer: Self-pay | Admitting: Adult Health

## 2016-07-17 ENCOUNTER — Ambulatory Visit (INDEPENDENT_AMBULATORY_CARE_PROVIDER_SITE_OTHER): Payer: Medicare Other | Admitting: Adult Health

## 2016-07-17 VITALS — BP 108/58 | HR 78 | Ht 67.0 in | Wt 132.6 lb

## 2016-07-17 DIAGNOSIS — J189 Pneumonia, unspecified organism: Secondary | ICD-10-CM

## 2016-07-17 DIAGNOSIS — J9621 Acute and chronic respiratory failure with hypoxia: Secondary | ICD-10-CM | POA: Diagnosis not present

## 2016-07-17 DIAGNOSIS — J432 Centrilobular emphysema: Secondary | ICD-10-CM

## 2016-07-17 LAB — CBC
HCT: 33.6 % — ABNORMAL LOW (ref 39.0–52.0)
HEMOGLOBIN: 11.1 g/dL — AB (ref 13.0–17.0)
MCHC: 32.9 g/dL (ref 30.0–36.0)
MCV: 87.9 fl (ref 78.0–100.0)
Platelets: 248 10*3/uL (ref 150.0–400.0)
RBC: 3.82 Mil/uL — ABNORMAL LOW (ref 4.22–5.81)
RDW: 16.9 % — AB (ref 11.5–15.5)
WBC: 5.3 10*3/uL (ref 4.0–10.5)

## 2016-07-17 NOTE — Patient Instructions (Signed)
Glad you are feeling better!  Continue oxygen as before 24/7  We will check your blood counts on your way out today  Continue pulmonary rehab  Follow up with Dr. Lenna Gilford in February as previously scheduled  Please contact office for sooner follow up if symptoms do not improve or worsen or seek emergency care

## 2016-07-17 NOTE — Progress Notes (Signed)
Chief Complaint  Patient presents with  . Hospitalization Follow-up    SN patient: Pt seen in [facility] 8/11-8/14 for PNA. Pt states that since d/c he has been doing well. Denies cough, wheeze and chest congestion.      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 Emphysema lung (Andrew [name])   . Hypertension   . Pulmonary hypertension ([facility])      Past surgical hx, Allergies, Family hx, Social hx all reviewed.  Current Outpatient Prescriptions on File Prior to Visit  Medication Sig  . ADVAIR DISKUS 250-50 MCG/DOSE AEPB Inhale 1 puff into the lungs 2 (two) times daily.  Marland Kitchen albuterol (PROVENTIL HFA;VENTOLIN HFA) 108 (90 Base) MCG/ACT inhaler Inhale 2 puffs into the lungs every 6 (six) hours as needed for wheezing or shortness of breath.  . diltiazem (CARTIA XT) 240 MG 24 hr capsule Take 240 mg by mouth at bedtime.  Marland Kitchen esomeprazole (NEXIUM) 20 MG capsule Take 20 mg by mouth 2 (two) times daily before a meal.   . finasteride (PROSCAR) 5 MG tablet Take 5 mg by mouth at bedtime.   Marland Kitchen ipratropium-albuterol (DUONEB) 0.5-2.5 (3) MG/3ML SOLN Take 3 mLs by nebulization every 6 (six) hours as needed. Dx: J44.9 (Patient taking differently: Take 3 mLs by nebulization every 6 (six) hours as needed (shortness of breath/ wheezing). Dx: J44.9)  . liver oil-zinc oxide (DESITIN) 40 % ointment Apply 1 application topically See admin instructions. Apply to left butt cheek for wound care approximately every 4 hours while awake  . Multiple Vitamins-Minerals (MULTIVITAMIN WITH MINERALS) tablet Take 1 tablet by mouth daily.  Marland Kitchen OVER THE COUNTER MEDICATION Apply 1 application topically See admin instructions. Protective ointment from home health care - apply approximately every 4 hours to left butt cheek for wound care  . OXYGEN Inhale 2-3  L into the lungs continuous. 2L daytime, 3L at night  . polyethylene glycol (MIRALAX / GLYCOLAX) packet Take 17 g by mouth daily. (Patient taking differently: Take 17 g by mouth at bedtime. Mix in 8 oz liquid and drink)  . rosuvastatin (CRESTOR) 10 MG tablet Take 10 mg by mouth daily.  . sildenafil (REVATIO) 20 MG tablet Take 20 mg by mouth 2 (two) times daily.   Marland Kitchen SPIRIVA HANDIHALER 18 MCG inhalation capsule INHALE THE CONTENTS OF 1 CAPSULE EVERY DAY (Patient taking differently: INHALE THE CONTENTS OF 1 CAPSULE EVERY DAY AT NOON)  . tamsulosin (FLOMAX) 0.4 MG CAPS capsule Take 1 capsule (0.4 mg total) by mouth daily after breakfast.  . temazepam (RESTORIL) 30 MG capsule Take 30 mg by mouth at bedtime.  . vitamin C (ASCORBIC ACID) 500 MG tablet Take 500 mg by mouth daily.  Marland Kitchen VITAMIN E PO Take 1 capsule by mouth daily.   No current facility-administered medications on file prior to visit.      Vital Signs BP (!) 108/58 (BP Location: Left Arm, Cuff Size: Normal)   Pulse 78   Ht 5\' 7"  (1.702 m)   Wt 132 lb 9.6 oz (60.1 kg)   SpO2 97%   BMI 20.77 kg/m   History of Present Illness Andrew [name] is a 75 y.o. male with hx COPD, HTN, OSA, GERD with recent hospital admission from 8/11-8/14 for acute on chronic respiratory failure r/t CAP and AECOPD with sepsis.  He was treated with  IV abx, IV steroids and BD's.  He was transitioned to PO azithro and ceftin on discharge.  He presents today for hospital follow up.   Breathing feels better, back to baseline but he remains weak and tires easily.  Denies cough, chest pain, hemoptysis, purulent sputum, fevers, chills.  Back to his baseline 2L Andrew [name].    Physical Exam  General - thin male, No distress  ENT - No sinus tenderness, no oral exudate, no LAN Cardiac - s1s2 regular, no murmur Chest - resps even non labored on [place], slightly diminished R, no audible wheeze  Back - No focal tenderness Abd - Soft, non-tender Ext - No edema Neuro - Normal  strength Skin - No rashes Psych - normal mood, and behavior   Assessment/Plan  Acute on chronic respiratory failure - resolved.  COPD  CAP - resolved  Leukocytosis   Patient Instructions  Glad you are feeling better!  Continue oxygen as before 24/7  We will check your blood counts on your way out today  Continue pulmonary rehab  Follow up with Dr. Lenna Gilford in February as previously scheduled  Please contact office for sooner follow up if symptoms do not improve or worsen or seek emergency care    Nickolas Madrid, NP 07/17/2016  3:15 PM

## 2016-07-17 NOTE — Telephone Encounter (Signed)
Notes Recorded by Inge Rise, [place] on 07/17/2016 at 4:21 PM EDT [facility] x1 ------  Notes Recorded by Marijean Heath, NP on 07/17/2016 at 3:08 PM EDT Please let patient know that his WBC is back to normal after his pneumonia. ----------------------------------------------  Spoke with pt. He is aware of results. Nothing further was needed.

## 2016-07-18 ENCOUNTER — Encounter (HOSPITAL_COMMUNITY)
Admission: RE | Admit: 2016-07-18 | Discharge: 2016-07-18 | Disposition: A | Discharge status: Home / Self Care | Payer: Medicare Other | Source: Ambulatory Visit | Attending: Family Medicine | Admitting: Family Medicine

## 2016-07-18 VITALS — Wt 133.4 lb

## 2016-07-18 DIAGNOSIS — Z87891 Personal history of nicotine dependence: Secondary | ICD-10-CM | POA: Diagnosis not present

## 2016-07-18 DIAGNOSIS — I272 Other secondary pulmonary hypertension: Secondary | ICD-10-CM | POA: Diagnosis not present

## 2016-07-18 DIAGNOSIS — J439 Emphysema, unspecified: Secondary | ICD-10-CM | POA: Diagnosis not present

## 2016-07-18 DIAGNOSIS — Z79899 Other long term (current) drug therapy: Secondary | ICD-10-CM | POA: Diagnosis not present

## 2016-07-18 NOTE — Progress Notes (Signed)
Daily Session Note  Patient Details  Name: Andrew [name] MRN: [id number] Date of Birth: [birth year] Referring Provider:   [name] Pulmonary Rehab Walk Test from 05/02/2016 in [place]  Referring Provider  Dr. Dorthy Cooler      Encounter Date: 07/18/2016  Check In:     Session Check In - 07/18/16 1020      Check-In   Location [facility]   Staff Present Su Hilt, MS, ACSM RCEP, Exercise Physiologist;Annedrea Stackhouse, RN, MHA;Portia Rollene Rotunda, RN, Maxcine Ham, RN, Roque Cash, RN   Supervising physician immediately available to respond to emergencies [facility] immediately available   Physician(s) Dr. Marily Memos   Medication changes reported     No   Fall or balance concerns reported    No   Warm-up and Cool-down Performed as group-led instruction   Resistance Training Performed Yes   VAD Patient? No     Pain Assessment   Currently in Pain? No/denies   Multiple Pain Sites No      Capillary Blood Glucose: No results found for this or any previous visit (from the past 24 hour(s)).      Exercise Prescription Changes - 07/18/16 1200      Response to Exercise   Blood Pressure (Admit) 100/60   Blood Pressure (Exercise) 126/60   Blood Pressure (Exit) 108/54   Heart Rate (Admit) 79 bpm   Heart Rate (Exercise) 110 bpm   Heart Rate (Exit) 89 bpm   Oxygen Saturation (Admit) 100 %   Oxygen Saturation (Exercise) 96 %   Oxygen Saturation (Exit) 100 %   Rating of Perceived Exertion (Exercise) 11   Perceived Dyspnea (Exercise) 1   Duration Progress to 45 minutes of aerobic exercise without signs/symptoms of physical distress   Intensity THRR unchanged     Progression   Progression Continue to progress workloads to maintain intensity without signs/symptoms of physical distress.     Resistance Training   Training Prescription Yes   Weight orange bands   Reps 10-12  10 minutes of strength training     Interval Training   Interval Training No     Oxygen   Oxygen Continuous   Liters 2     NuStep   Level 4   Minutes 17   METs 2.2     Track   Laps 12   Minutes 17     Goals Met:  Exercise tolerated well No report of cardiac concerns or symptoms Strength training completed today  Goals Unmet:  Not Applicable  Comments: Service time is from 10:30am to 12:00pm    Dr. Rush Farmer is Medical Director for Pulmonary Rehab at [facility].

## 2016-07-23 ENCOUNTER — Encounter (HOSPITAL_COMMUNITY)
Admission: RE | Admit: 2016-07-23 | Discharge: 2016-07-23 | Disposition: A | Discharge status: Home / Self Care | Payer: Medicare Other | Source: Ambulatory Visit | Attending: Family Medicine | Admitting: Family Medicine

## 2016-07-23 VITALS — Wt 134.7 lb

## 2016-07-23 DIAGNOSIS — Z79899 Other long term (current) drug therapy: Secondary | ICD-10-CM | POA: Insufficient documentation

## 2016-07-23 DIAGNOSIS — Z87891 Personal history of nicotine dependence: Secondary | ICD-10-CM | POA: Insufficient documentation

## 2016-07-23 DIAGNOSIS — I272 Other secondary pulmonary hypertension: Secondary | ICD-10-CM | POA: Insufficient documentation

## 2016-07-23 DIAGNOSIS — J439 Emphysema, unspecified: Secondary | ICD-10-CM | POA: Insufficient documentation

## 2016-07-23 NOTE — Progress Notes (Addendum)
Daily Session Note  Patient Details  Name: Andrew [name] MRN: [id number] Date of Birth: 12/21/1940 Referring Provider:   [name] Pulmonary Rehab Walk Test from 05/02/2016 in [place]  Referring Provider  Dr. Dorthy Cooler      Encounter Date: 07/23/2016  Check In:     Session Check In - 07/23/16 1030      Check-In   Location [facility]   Staff Present Rosebud Poles, RN, BSN;Chaniah Cisse Ysidro Evert, Felipe Drone, RN, MHA;Portia Rollene Rotunda, RN, BSN   Supervising physician immediately available to respond to emergencies [facility] immediately available   Physician(s) Dr. Marily Memos   Medication changes reported     No   Fall or balance concerns reported    No   Warm-up and Cool-down Performed as group-led instruction   Resistance Training Performed Yes   VAD Patient? No     Pain Assessment   Currently in Pain? No/denies   Multiple Pain Sites No      Capillary Blood Glucose: No results found for this or any previous visit (from the past 24 hour(s)).      Exercise Prescription Changes - 07/23/16 1200      Exercise Review   Progression Yes     Response to Exercise   Blood Pressure (Admit) 104/60   Blood Pressure (Exercise) 134/60   Blood Pressure (Exit) 104/62   Heart Rate (Admit) 78 bpm   Heart Rate (Exercise) 111 bpm   Heart Rate (Exit) 83 bpm   Oxygen Saturation (Admit) 98 %   Oxygen Saturation (Exercise) 94 %   Oxygen Saturation (Exit) 100 %   Rating of Perceived Exertion (Exercise) 11   Perceived Dyspnea (Exercise) 1   Duration Progress to 45 minutes of aerobic exercise without signs/symptoms of physical distress   Intensity THRR unchanged     Progression   Progression Continue to progress workloads to maintain intensity without signs/symptoms of physical distress.     Resistance Training   Training Prescription Yes   Weight orange bands   Reps 10-12  10 minutes of strength training     Interval Training    Interval Training No     Oxygen   Oxygen Continuous   Liters 2     NuStep   Level 5   Minutes 34   METs 2     Track   Laps 10   Minutes 17     Goals Met:  Exercise tolerated well No report of cardiac concerns or symptoms Strength training completed today  Goals Unmet:  Not Applicable  Comments: Service time is from 1030 to 1215    Dr. Rush Farmer is Medical Director for Pulmonary Rehab at [facility].

## 2016-07-25 ENCOUNTER — Ambulatory Visit (HOSPITAL_COMMUNITY)
Admission: RE | Admit: 2016-07-25 | Discharge: 2016-07-25 | Disposition: A | Discharge status: Home / Self Care | Payer: Medicare Other | Source: Ambulatory Visit | Attending: Physician Assistant | Admitting: Physician Assistant

## 2016-07-25 ENCOUNTER — Encounter (HOSPITAL_COMMUNITY)
Admission: RE | Admit: 2016-07-25 | Discharge: 2016-07-25 | Disposition: A | Discharge status: Home / Self Care | Payer: Medicare Other | Source: Ambulatory Visit | Attending: Family Medicine | Admitting: Family Medicine

## 2016-07-25 VITALS — Wt 134.0 lb

## 2016-07-25 DIAGNOSIS — J439 Emphysema, unspecified: Secondary | ICD-10-CM

## 2016-07-25 DIAGNOSIS — K219 Gastro-esophageal reflux disease without esophagitis: Secondary | ICD-10-CM | POA: Insufficient documentation

## 2016-07-25 DIAGNOSIS — Z87891 Personal history of nicotine dependence: Secondary | ICD-10-CM | POA: Diagnosis not present

## 2016-07-25 DIAGNOSIS — Z79899 Other long term (current) drug therapy: Secondary | ICD-10-CM | POA: Diagnosis not present

## 2016-07-25 DIAGNOSIS — K449 Diaphragmatic hernia without obstruction or gangrene: Secondary | ICD-10-CM | POA: Insufficient documentation

## 2016-07-25 DIAGNOSIS — I272 Other secondary pulmonary hypertension: Secondary | ICD-10-CM | POA: Diagnosis not present

## 2016-07-25 DIAGNOSIS — R1310 Dysphagia, unspecified: Secondary | ICD-10-CM | POA: Insufficient documentation

## 2016-07-25 NOTE — Progress Notes (Signed)
Daily Session Note  Patient Details  Name: Andrew [name] MRN: [id number] Date of Birth: Sep 03, 1941 Referring Provider:   [name] Pulmonary Rehab Walk Test from 05/02/2016 in [place]  Referring Provider  Dr. Dorthy Cooler      Encounter Date: 07/25/2016  Check In:     Session Check In - 07/25/16 1018      Check-In   Location [facility]   Staff Present Su Hilt, MS, ACSM RCEP, Exercise Physiologist;Joan Leonia Reeves, Therapist, sports, BSN;Ramon Dredge, RN, MHA;Portia Rollene Rotunda, RN, BSN   Supervising physician immediately available to respond to emergencies [facility] immediately available   Physician(s) Dr. Allyson Sabal   Medication changes reported     No   Fall or balance concerns reported    No   Warm-up and Cool-down Performed as group-led instruction   Resistance Training Performed Yes   VAD Patient? No     Pain Assessment   Currently in Pain? No/denies   Multiple Pain Sites No      Capillary Blood Glucose: No results found for this or any previous visit (from the past 24 hour(s)).      Exercise Prescription Changes - 07/25/16 1300      Response to Exercise   Blood Pressure (Admit) 122/56   Blood Pressure (Exercise) 150/62   Blood Pressure (Exit) 114/62   Heart Rate (Admit) 81 bpm   Heart Rate (Exercise) 105 bpm   Heart Rate (Exit) 84 bpm   Oxygen Saturation (Admit) 89 %   Oxygen Saturation (Exercise) 94 %   Oxygen Saturation (Exit) 99 %   Rating of Perceived Exertion (Exercise) 11   Perceived Dyspnea (Exercise) 1   Duration Progress to 45 minutes of aerobic exercise without signs/symptoms of physical distress   Intensity THRR unchanged     Progression   Progression Continue to progress workloads to maintain intensity without signs/symptoms of physical distress.     Resistance Training   Training Prescription Yes   Weight orange bands   Reps 10-12  10 minutes of strength training     Interval Training   Interval Training No     Oxygen   Oxygen Continuous   Liters 2     NuStep   Level 5   Minutes 17   METs 2.4     Track   Laps 13   Minutes 17     Goals Met:  Exercise tolerated well No report of cardiac concerns or symptoms Strength training completed today  Goals Unmet:  Not Applicable  Comments: Service time is from 10:30am to 12:10pm    Dr. Rush Farmer is Medical Director for Pulmonary Rehab at [facility].

## 2016-07-29 DIAGNOSIS — Z23 Encounter for immunization: Secondary | ICD-10-CM | POA: Diagnosis not present

## 2016-07-30 ENCOUNTER — Encounter (HOSPITAL_COMMUNITY)
Admission: RE | Admit: 2016-07-30 | Discharge: 2016-07-30 | Disposition: A | Discharge status: Home / Self Care | Payer: Medicare Other | Source: Ambulatory Visit | Attending: Family Medicine | Admitting: Family Medicine

## 2016-07-30 VITALS — Wt 135.4 lb

## 2016-07-30 DIAGNOSIS — Z79899 Other long term (current) drug therapy: Secondary | ICD-10-CM | POA: Diagnosis not present

## 2016-07-30 DIAGNOSIS — J439 Emphysema, unspecified: Secondary | ICD-10-CM

## 2016-07-30 DIAGNOSIS — I272 Other secondary pulmonary hypertension: Secondary | ICD-10-CM | POA: Diagnosis not present

## 2016-07-30 DIAGNOSIS — Z87891 Personal history of nicotine dependence: Secondary | ICD-10-CM | POA: Diagnosis not present

## 2016-07-30 NOTE — Progress Notes (Signed)
Daily Session Note  Patient Details  Name: Andrew [name] MRN: [id number] Date of Birth: November 13, 1941 Referring Provider:   [name] Pulmonary Rehab Walk Test from 05/02/2016 in [place]  Referring Provider  Dr. Dorthy Cooler      Encounter Date: 07/30/2016  Check In:     Session Check In - 07/30/16 1016      Check-In   Location [facility]   Staff Present Su Hilt, MS, ACSM RCEP, Exercise Physiologist;Lisa Ysidro Evert, RN;Portia Rollene Rotunda, Therapist, sports, BSN;Ramon Dredge, RN, [facility]   Supervising physician immediately available to respond to emergencies [facility] immediately available   Physician(s) Dr. Marily Memos   Medication changes reported     No   Fall or balance concerns reported    No   Warm-up and Cool-down Performed as group-led instruction   Resistance Training Performed Yes   VAD Patient? No     Pain Assessment   Currently in Pain? No/denies   Multiple Pain Sites No      Capillary Blood Glucose: No results found for this or any previous visit (from the past 24 hour(s)).      Exercise Prescription Changes - 07/30/16 1200      Exercise Review   Progression Yes     Response to Exercise   Blood Pressure (Admit) 118/64   Blood Pressure (Exercise) 108/60   Blood Pressure (Exit) 102/60   Heart Rate (Admit) 85 bpm   Heart Rate (Exercise) 120 bpm   Heart Rate (Exit) 93 bpm   Oxygen Saturation (Admit) 100 %   Oxygen Saturation (Exercise) 93 %   Oxygen Saturation (Exit) 93 %   Rating of Perceived Exertion (Exercise) 11   Perceived Dyspnea (Exercise) 2   Duration Progress to 45 minutes of aerobic exercise without signs/symptoms of physical distress   Intensity THRR unchanged     Progression   Progression Continue to progress workloads to maintain intensity without signs/symptoms of physical distress.     Resistance Training   Training Prescription Yes   Weight orange bands   Reps 10-12  10 minutes of strength  training     Interval Training   Interval Training No     Oxygen   Oxygen Continuous   Liters 2     NuStep   Level 5   Minutes 34   METs 2.3     Track   Laps 14   Minutes 17     Goals Met:  Exercise tolerated well No report of cardiac concerns or symptoms Strength training completed today  Goals Unmet:  Not Applicable  Comments: Service time is from 10:30AM to 12:10PM    Dr. Rush Farmer is Medical Director for Pulmonary Rehab at [facility].

## 2016-08-01 ENCOUNTER — Encounter (HOSPITAL_COMMUNITY)
Admission: RE | Admit: 2016-08-01 | Discharge: 2016-08-01 | Disposition: A | Discharge status: Home / Self Care | Payer: Medicare Other | Source: Ambulatory Visit | Attending: Family Medicine | Admitting: Family Medicine

## 2016-08-01 DIAGNOSIS — Z87891 Personal history of nicotine dependence: Secondary | ICD-10-CM | POA: Diagnosis not present

## 2016-08-01 DIAGNOSIS — Z79899 Other long term (current) drug therapy: Secondary | ICD-10-CM | POA: Diagnosis not present

## 2016-08-01 DIAGNOSIS — J439 Emphysema, unspecified: Secondary | ICD-10-CM

## 2016-08-01 DIAGNOSIS — I272 Other secondary pulmonary hypertension: Secondary | ICD-10-CM | POA: Diagnosis not present

## 2016-08-01 NOTE — Progress Notes (Signed)
Daily Session Note  Patient Details  Name: Andrew [name] MRN: [id number] Date of Birth: 12-26-1940 Referring Provider:   [name] Pulmonary Rehab Walk Test from 05/02/2016 in [place]  Referring Provider  Dr. Dorthy Cooler      Encounter Date: 08/01/2016  Check In:     Session Check In - 08/01/16 1031      Check-In   [facility]   Staff Present Rosebud Poles, RN, BSN;Molly diVincenzo, MS, ACSM RCEP, Exercise Physiologist;Lisa Ysidro Evert, Felipe Drone, RN, MHA;Portia Rollene Rotunda, RN, BSN   Supervising physician immediately available to respond to emergencies [facility] immediately available   Physician(s) Dr. Waldron Labs   Medication changes reported     No   Fall or balance concerns reported    No   Warm-up and Cool-down Performed as group-led instruction   Resistance Training Performed Yes   VAD Patient? No     Pain Assessment   Currently in Pain? No/denies   Multiple Pain Sites No      Capillary Blood Glucose: No results found for this or any previous visit (from the past 24 hour(s)).      Exercise Prescription Changes - 08/01/16 1200      Response to Exercise   Blood Pressure (Admit) 98/50   Blood Pressure (Exercise) 124/56   Blood Pressure (Exit) 90/50   Heart Rate (Admit) 84 bpm   Heart Rate (Exercise) 114 bpm   Heart Rate (Exit) 94 bpm   Oxygen Saturation (Admit) 98 %   Oxygen Saturation (Exercise) 94 %   Oxygen Saturation (Exit) 100 %   Rating of Perceived Exertion (Exercise) 12   Perceived Dyspnea (Exercise) 1   Duration Progress to 45 minutes of aerobic exercise without signs/symptoms of physical distress   Intensity THRR unchanged     Progression   Progression Continue to progress workloads to maintain intensity without signs/symptoms of physical distress.     Resistance Training   Training Prescription Yes   Weight orange bands   Reps 10-12  10 minutes of strength training     Interval Training   Interval Training No     Oxygen   Oxygen Continuous   Liters 2     NuStep   Level 5   Minutes 17   METs 2.6     Track   Laps 16   Minutes 17     Goals Met:  Exercise tolerated well Strength training completed today  Goals Unmet:  Not Applicable  Comments: Service time is from 1030 to 1205    Dr. Rush Farmer is Medical Director for Pulmonary Rehab at [facility].

## 2016-08-01 NOTE — Progress Notes (Signed)
Pulmonary Individual Treatment Plan  Patient Details  Name: Andrew [name] MRN: [id number] Date of Birth: 10-23-1941 Referring Provider:   [name] Pulmonary Rehab Walk Test from 05/02/2016 in [place]  Referring Provider  Dr. Dorthy Cooler      Initial Encounter Date:  Flowsheet Row Pulmonary Rehab Walk Test from 05/02/2016 in [place]  Date  05/02/16  Referring Provider  Dr. Dorthy Cooler      Visit Diagnosis: Pulmonary emphysema, unspecified emphysema type ([place])  Patient's Home Medications on Admission:   Current Outpatient Prescriptions:  .  ADVAIR DISKUS 250-50 MCG/DOSE AEPB, Inhale 1 puff into the lungs 2 (two) times daily., Disp: 60 each, Rfl: 2 .  albuterol (PROVENTIL HFA;VENTOLIN HFA) 108 (90 Base) MCG/ACT inhaler, Inhale 2 puffs into the lungs every 6 (six) hours as needed for wheezing or shortness of breath., Disp: 1 Inhaler, Rfl: 2 .  diltiazem (CARTIA XT) 240 MG 24 hr capsule, Take 240 mg by mouth at bedtime., Disp: , Rfl:  .  esomeprazole (NEXIUM) 20 MG capsule, Take 20 mg by mouth 2 (two) times daily before a meal. , Disp: , Rfl:  .  finasteride (PROSCAR) 5 MG tablet, Take 5 mg by mouth at bedtime. , Disp: , Rfl:  .  ipratropium-albuterol (DUONEB) 0.5-2.5 (3) MG/3ML SOLN, Take 3 mLs by nebulization every 6 (six) hours as needed. Dx: J44.9 (Patient taking differently: Take 3 mLs by nebulization every 6 (six) hours as needed (shortness of breath/ wheezing). Dx: J44.9), Disp: 360 mL, Rfl: 12 .  liver oil-zinc oxide (DESITIN) 40 % ointment, Apply 1 application topically See admin instructions. Apply to left butt cheek for wound care approximately every 4 hours while awake, Disp: , Rfl:  .  Multiple Vitamins-Minerals (MULTIVITAMIN WITH MINERALS) tablet, Take 1 tablet by mouth daily., Disp: , Rfl:  .  OVER THE COUNTER MEDICATION, Apply 1 application topically See admin instructions. Protective ointment from home health  care - apply approximately every 4 hours to left butt cheek for wound care, Disp: , Rfl:  .  OXYGEN, Inhale 2-3 L into the lungs continuous. 2L daytime, 3L at night, Disp: , Rfl:  .  polyethylene glycol (MIRALAX / GLYCOLAX) packet, Take 17 g by mouth daily. (Patient taking differently: Take 17 g by mouth at bedtime. Mix in 8 oz liquid and drink), Disp: 14 each, Rfl: 1 .  rosuvastatin (CRESTOR) 10 MG tablet, Take 10 mg by mouth daily., Disp: , Rfl:  .  sildenafil (REVATIO) 20 MG tablet, Take 20 mg by mouth 2 (two) times daily. , Disp: , Rfl:  .  SPIRIVA HANDIHALER 18 MCG inhalation capsule, INHALE THE CONTENTS OF 1 CAPSULE EVERY DAY (Patient taking differently: INHALE THE CONTENTS OF 1 CAPSULE EVERY DAY AT NOON), Disp: 90 capsule, Rfl: 2 .  tamsulosin (FLOMAX) 0.4 MG CAPS capsule, Take 1 capsule (0.4 mg total) by mouth daily after breakfast., Disp: 30 capsule, Rfl: 0 .  temazepam (RESTORIL) 30 MG capsule, Take 30 mg by mouth at bedtime., Disp: , Rfl:  .  vitamin C (ASCORBIC ACID) 500 MG tablet, Take 500 mg by mouth daily., Disp: , Rfl:  .  VITAMIN E PO, Take 1 capsule by mouth daily., Disp: , Rfl:   Past Medical History: Past Medical History:  Diagnosis Date  . Emphysema lung ([place])   . Hypertension   . Pulmonary hypertension ([facility])     Tobacco Use: History  Smoking Status  . Former Smoker  .  Packs/day: 0.00  . Years: 0.00  . Types: Cigarettes  . Quit date: 10/01/1984  Smokeless Tobacco  . Never Used    Labs: Recent Review Flowsheet Data    Labs for ITP Cardiac and Pulmonary Rehab Latest Ref Rng & Units 01/02/2016 01/10/2016 06/28/2016   Hemoglobin A1c 4.8 - 5.6 % 6.2(H) - -   PHART 7.350 - 7.450 - - 7.444   PCO2ART 35.0 - 45.0 mmHg - - 35.2   HCO3 20.0 - 24.0 mEq/L - - 24.1(H)   TCO2 0 - 100 mmol/L - 26 25   O2SAT % - - 94.0      Capillary Blood Glucose: No results found for: GLUCAP   ADL UCSD:     Pulmonary Assessment Scores    Row Name 05/09/16 0756         ADL  UCSD   ADL Phase Entry     SOB Score total 66        Pulmonary Function Assessment:   Exercise Target Goals:    Exercise Program Goal: Individual exercise prescription set with THRR, safety & activity barriers. Participant demonstrates ability to understand and report RPE using BORG scale, to self-measure pulse accurately, and to acknowledge the importance of the exercise prescription.  Exercise Prescription Goal: Starting with aerobic activity 30 plus minutes a day, 3 days per week for initial exercise prescription. Provide home exercise prescription and guidelines that participant acknowledges understanding prior to discharge.  Activity Barriers & Risk Stratification:     Activity Barriers & Cardiac Risk Stratification - 04/29/16 1233      Activity Barriers & Cardiac Risk Stratification   Activity Barriers Shortness of Breath;Muscular Weakness;Deconditioning      6 Minute Walk:     6 Minute Walk    Row Name 05/02/16 1704         6 Minute Walk   Phase Initial     Distance 900 feet     Walk Time 5.45 minutes     # of Rest Breaks 1     MPH 1.77     METS 2.77     RPE 13     Perceived Dyspnea  2     VO2 Peak 9.72     Symptoms No     Resting HR 111 bpm     Resting BP 130/60     Max Ex. HR 126 bpm     Max Ex. BP 160/70     2 Minute Post BP 160/70       Interval HR   Baseline HR 111     1 Minute HR 119     2 Minute HR 122     3 Minute HR 126     4 Minute HR 123     5 Minute HR 120     6 Minute HR 118     2 Minute Post HR 116     Interval Heart Rate? Yes       Interval Oxygen   Interval Oxygen? Yes     Baseline Oxygen Saturation % 94 %     Baseline Liters of Oxygen 2 L     1 Minute Oxygen Saturation % 119 %     1 Minute Liters of Oxygen 2 L     2 Minute Oxygen Saturation % 122 %     2 Minute Liters of Oxygen 2 L     3 Minute Oxygen Saturation % 126 %     3 Minute  Liters of Oxygen 2 L     4 Minute Oxygen Saturation % 123 %     4 Minute Liters of  Oxygen 2 L     5 Minute Oxygen Saturation % 120 %     5 Minute Liters of Oxygen 2 L     6 Minute Oxygen Saturation % 118 %     6 Minute Liters of Oxygen 2 L     2 Minute Post Oxygen Saturation % 116 %     2 Minute Post Liters of Oxygen 2 L        Initial Exercise Prescription:     Initial Exercise Prescription - 05/03/16 1600      Date of Initial Exercise RX and Referring Provider   Date 05/02/16   Referring Provider Dr. Dorthy Cooler     Oxygen   Oxygen Continuous   Liters 2     Bike   Level 0.3   Minutes 15     NuStep   Level 2   Minutes 15   METs 1.7     Track   Laps 7   Minutes 15     Prescription Details   Frequency (times per week) 2   Duration Progress to 45 minutes of aerobic exercise without signs/symptoms of physical distress     Intensity   THRR 40-80% of Max Heartrate 58-115   Ratings of Perceived Exertion 11-13   Perceived Dyspnea 0-4     Progression   Progression Continue progressive overload as per policy without signs/symptoms or physical distress.     Resistance Training   Training Prescription Yes   Weight orange bands   Reps 10-12      Perform Capillary Blood Glucose checks as needed.  Exercise Prescription Changes:     Exercise Prescription Changes    Row Name 05/09/16 1200 05/14/16 1200 05/16/16 1600 05/23/16 1200 05/28/16 1200     Exercise Review   Progression  -  - Yes Yes  -     Response to Exercise   Blood Pressure (Admit) 100/50 100/46 124/60 118/72 100/50   Blood Pressure (Exercise) 130/60 132/68 120/60 138/64 136/58   Blood Pressure (Exit) 104/60 102/60 110/60 98/60 100/60   Heart Rate (Admit) 87 bpm 92 bpm 80 bpm 87 bpm 68 bpm   Heart Rate (Exercise) 103 bpm 111 bpm 108 bpm 114 bpm 103 bpm   Heart Rate (Exit) 90 bpm 93 bpm 88 bpm 89 bpm 76 bpm   Oxygen Saturation (Admit) 98 % 99 % 96 % 98 % 98 %   Oxygen Saturation (Exercise) 94 % 92 % 88 % 89 % 92 %   Oxygen Saturation (Exit) 100 % 100 % 88 % 95 % 98 %   Rating of  Perceived Exertion (Exercise) 13 12 13 13 13    Perceived Dyspnea (Exercise) 2 1 3 2 2    Duration Progress to 45 minutes of aerobic exercise without signs/symptoms of physical distress Progress to 45 minutes of aerobic exercise without signs/symptoms of physical distress Progress to 45 minutes of aerobic exercise without signs/symptoms of physical distress Progress to 45 minutes of aerobic exercise without signs/symptoms of physical distress Progress to 45 minutes of aerobic exercise without signs/symptoms of physical distress   Intensity THRR unchanged THRR unchanged THRR unchanged THRR unchanged THRR unchanged     Progression   Progression Continue progressive overload as per policy without signs/symptoms or physical distress. Continue progressive overload as per policy without signs/symptoms or physical distress. Continue progressive  overload as per policy without signs/symptoms or physical distress. Continue to progress workloads to maintain intensity without signs/symptoms of physical distress. Continue to progress workloads to maintain intensity without signs/symptoms of physical distress.     Resistance Training   Training Prescription Yes Yes Yes Yes Yes   Weight orange bands orange bands orange bands orange bands orange bands   Reps 10-12 10-12 10-12 10-12 10-12     Interval Training   Interval Training  -  - Yes No No     Oxygen   Oxygen Continuous Continuous Continuous Continuous Continuous   Liters 2 2 2 2 2      Bike   Level 0.3 0.3 0.3 0.4 0.3   Minutes 15 17 17 17 17      NuStep   Level  - 1 3 4 4    Minutes  - 17 17 17 17    METs  - 1.6 1.9 2.4 2.3     Track   Laps 8 12  -  - 11   Minutes 15 17  -  - 17   Row Name 05/30/16 1200 06/04/16 1200 06/06/16 1300 06/11/16 1200 06/13/16 1254     Response to Exercise   Blood Pressure (Admit) 108/50 110/54 110/54 104/60 104/60   Blood Pressure (Exercise) 108/60 124/50 136/78 132/70 148/64   Blood Pressure (Exit) 94/50 100/60  102/50 118/66 118/60   Heart Rate (Admit) 73 bpm 73 bpm 75 bpm 79 bpm 72 bpm   Heart Rate (Exercise) 104 bpm 94 bpm 99 bpm 114 bpm 85 bpm   Heart Rate (Exit) 76 bpm 72 bpm 78 bpm 80 bpm 78 bpm   Oxygen Saturation (Admit) 98 % 100 % 99 % 99 % 100 %   Oxygen Saturation (Exercise) 92 % 94 % 94 % 92 % 96 %   Oxygen Saturation (Exit) 99 % 99 % 99 % 99 % 97 %   Rating of Perceived Exertion (Exercise) 11 11 11 12 11    Perceived Dyspnea (Exercise) 1 1 1 1 1    Duration Progress to 45 minutes of aerobic exercise without signs/symptoms of physical distress Progress to 45 minutes of aerobic exercise without signs/symptoms of physical distress Progress to 45 minutes of aerobic exercise without signs/symptoms of physical distress Progress to 45 minutes of aerobic exercise without signs/symptoms of physical distress Progress to 45 minutes of aerobic exercise without signs/symptoms of physical distress   Intensity THRR unchanged THRR unchanged THRR unchanged THRR unchanged THRR unchanged     Progression   Progression Continue to progress workloads to maintain intensity without signs/symptoms of physical distress. Continue to progress workloads to maintain intensity without signs/symptoms of physical distress. Continue to progress workloads to maintain intensity without signs/symptoms of physical distress. Continue to progress workloads to maintain intensity without signs/symptoms of physical distress. Continue to progress workloads to maintain intensity without signs/symptoms of physical distress.     Resistance Training   Training Prescription Yes Yes Yes Yes Yes   Weight orange bands orange bands orange bands orange bands orange bands   Reps 10-12 10-12  10 minutes of strength training 10-12  10 minutes of strength training 10-12  10 minutes of strength training 10-12  10 minutes of strength training     Interval Training   Interval Training No  -  - No No     Oxygen   Oxygen Continuous Continuous  Continuous Continuous Continuous   Liters 2 2 2 2 2      Bike   Level  0.4 0.4  - 0.4 0.4   Minutes 17 17  - 17 17     NuStep   Level  - 4 4 4 4    Minutes  - 17 17 17 17    METs  - 2.9 2.2 2.5 2.4     Track   Laps 14 14 16 15   -   Minutes 17 17 17 17   -     Home Exercise Plan   Plans to continue exercise at  -  - Home  -  -   Frequency  -  - Add 3 additional days to program exercise sessions.  -  -   Row Name 06/18/16 1200 06/25/16 1200 06/27/16 1200 07/11/16 1259 07/16/16 1223     Response to Exercise   Blood Pressure (Admit) 106/60 98/62 116/60 108/50 110/60   Blood Pressure (Exercise) 122/46 120/50 140/66 110/58 116/58   Blood Pressure (Exit) 106/70 100/58 90/50  gave gatoraide recheck BP 108/60 100/56  gave gatoraide recheck BP 108/60 100/54   Heart Rate (Admit) 82 bpm 66 bpm 77 bpm 85 bpm 83 bpm   Heart Rate (Exercise) 98 bpm 101 bpm 115 bpm 96 bpm 100 bpm   Heart Rate (Exit) 80 bpm 76 bpm 71 bpm 81 bpm 78 bpm   Oxygen Saturation (Admit) 98 % 98 % 98 % 97 % 98 %   Oxygen Saturation (Exercise) 94 % 96 % 93 % 97 % 95 %   Oxygen Saturation (Exit) 100 % 100 % 96 % 100 % 100 %   Rating of Perceived Exertion (Exercise) 11 11 11 12 11    Perceived Dyspnea (Exercise) 1 1 2 3  0   Duration Progress to 45 minutes of aerobic exercise without signs/symptoms of physical distress Progress to 45 minutes of aerobic exercise without signs/symptoms of physical distress Progress to 45 minutes of aerobic exercise without signs/symptoms of physical distress Progress to 45 minutes of aerobic exercise without signs/symptoms of physical distress Progress to 45 minutes of aerobic exercise without signs/symptoms of physical distress   Intensity THRR unchanged THRR unchanged THRR unchanged THRR unchanged THRR unchanged     Progression   Progression Continue to progress workloads to maintain intensity without signs/symptoms of physical distress. Continue to progress workloads to maintain intensity without  signs/symptoms of physical distress. Continue to progress workloads to maintain intensity without signs/symptoms of physical distress. Continue to progress workloads to maintain intensity without signs/symptoms of physical distress. Continue to progress workloads to maintain intensity without signs/symptoms of physical distress.     Resistance Training   Training Prescription Yes Yes Yes Yes Yes   Weight orange bands orange bands orange bands orange bands orange bands   Reps 10-12  10 minutes of strength training 10-12  10 minutes of strength training 10-12  10 minutes of strength training 10-12  10 minutes of strength training 10-12  10 minutes of strength training     Interval Training   Interval Training No No No No No     Oxygen   Oxygen Continuous Continuous Continuous Continuous Continuous   Liters 2 2 2 2 2      Bike   Level 0.4 0.4  -  -  -   Minutes 17 17  -  -  -     NuStep   Level 4 4 4 4 4    Minutes 17 17 17 17  34   METs 2.4 2.3 2.4  - 1.8     Track   Laps 15  14 15 10 6    Minutes 17 17 17 17 17    Row Name 07/18/16 1200 07/23/16 1200 07/25/16 1300 07/30/16 1200       Exercise Review   Progression  - Yes  - Yes      Response to Exercise   Blood Pressure (Admit) 100/60 104/60 122/56 118/64    Blood Pressure (Exercise) 126/60 134/60 150/62 108/60    Blood Pressure (Exit) 108/54 104/62 114/62 102/60    Heart Rate (Admit) 79 bpm 78 bpm 81 bpm 85 bpm    Heart Rate (Exercise) 110 bpm 111 bpm 105 bpm 120 bpm    Heart Rate (Exit) 89 bpm 83 bpm 84 bpm 93 bpm    Oxygen Saturation (Admit) 100 % 98 % 89 % 100 %    Oxygen Saturation (Exercise) 96 % 94 % 94 % 93 %    Oxygen Saturation (Exit) 100 % 100 % 99 % 93 %    Rating of Perceived Exertion (Exercise) 11 11 11 11     Perceived Dyspnea (Exercise) 1 1 1 2     Duration Progress to 45 minutes of aerobic exercise without signs/symptoms of physical distress Progress to 45 minutes of aerobic exercise without signs/symptoms of  physical distress Progress to 45 minutes of aerobic exercise without signs/symptoms of physical distress Progress to 45 minutes of aerobic exercise without signs/symptoms of physical distress    Intensity THRR unchanged THRR unchanged THRR unchanged THRR unchanged      Progression   Progression Continue to progress workloads to maintain intensity without signs/symptoms of physical distress. Continue to progress workloads to maintain intensity without signs/symptoms of physical distress. Continue to progress workloads to maintain intensity without signs/symptoms of physical distress. Continue to progress workloads to maintain intensity without signs/symptoms of physical distress.      Resistance Training   Training Prescription Yes Yes Yes Yes    Weight orange bands orange bands orange bands orange bands    Reps 10-12  10 minutes of strength training 10-12  10 minutes of strength training 10-12  10 minutes of strength training 10-12  10 minutes of strength training      Interval Training   Interval Training No No No No      Oxygen   Oxygen Continuous Continuous Continuous Continuous    Liters 2 2 2 2       NuStep   Level 4 5 5 5     Minutes 17 34 17 34    METs 2.2 2 2.4 2.3      Track   Laps 12 10 13 14     Minutes 17 17 17 17        Exercise Comments:     Exercise Comments    Row Name 05/30/16 0807 06/06/16 1410 07/02/16 0733 07/29/16 1140     Exercise Comments Patient is tolerating workload increases and is showing improvement in stamina and strength home exercise completed Patient is progressing well at Rehab. Patient is walking at home on his days off from rehab. Will cont. to monitor.  Patient missed two weeks due to hospitalization--trying to regain strength. Will continue to monitor.        Discharge Exercise Prescription (Final Exercise Prescription Changes):     Exercise Prescription Changes - 07/30/16 1200      Exercise Review   Progression Yes     Response to  Exercise   Blood Pressure (Admit) 118/64   Blood Pressure (Exercise) 108/60   Blood Pressure (Exit) 102/60   Heart  Rate (Admit) 85 bpm   Heart Rate (Exercise) 120 bpm   Heart Rate (Exit) 93 bpm   Oxygen Saturation (Admit) 100 %   Oxygen Saturation (Exercise) 93 %   Oxygen Saturation (Exit) 93 %   Rating of Perceived Exertion (Exercise) 11   Perceived Dyspnea (Exercise) 2   Duration Progress to 45 minutes of aerobic exercise without signs/symptoms of physical distress   Intensity THRR unchanged     Progression   Progression Continue to progress workloads to maintain intensity without signs/symptoms of physical distress.     Resistance Training   Training Prescription Yes   Weight orange bands   Reps 10-12  10 minutes of strength training     Interval Training   Interval Training No     Oxygen   Oxygen Continuous   Liters 2     NuStep   Level 5   Minutes 34   METs 2.3     Track   Laps 14   Minutes 17       Nutrition:  Target Goals: Understanding of nutrition guidelines, daily intake of sodium 1500mg , cholesterol 200mg , calories 30% from fat and 7% or less from saturated fats, daily to have 5 or more servings of fruits and vegetables.  Biometrics:     Pre Biometrics - 04/29/16 1235      Pre Biometrics   Grip Strength 28 kg       Nutrition Therapy Plan and Nutrition Goals:     Nutrition Therapy & Goals - 07/16/16 1516      Nutrition Therapy   Diet High Calorie, High Protein diet     Personal Nutrition Goals   Personal Goal #1 1-2 lb wt gain per week to a wt gain goal of 6-24 lb at graduation from [place], educate and counsel regarding individualized specific dietary modifications aiming towards targeted core components such as weight, hypertension, lipid management, diabetes, heart failure and other comorbidities.;Nutrition handout(s) given to patient.  High Calorie, High Protein diet;  Suggestions for increasing calories and protein; High Calorie, High Protein recipes; and Pre-diabetes   Expected Outcomes Long Term Goal: Adherence to prescribed nutrition plan.;Short Term Goal: A plan has been developed with personal nutrition goals set during dietitian appointment.      Nutrition Discharge: Rate Your Plate Scores:     Nutrition Assessments - 07/16/16 1515      Rate Your Plate Scores   Pre Score 42  Score less than 49 desired due to pt needs to gain wt      Psychosocial: Target Goals: Acknowledge presence or absence of depression, maximize coping skills, provide positive support system. Participant is able to verbalize types and ability to use techniques and skills needed for reducing stress and depression.  Initial Review & Psychosocial Screening:     Initial Psych Review & Screening - 04/29/16 1243      Initial Review   Current issues with --  none identified     Family Dynamics   Good Support System? Yes     Barriers   Psychosocial barriers to participate in program There are no identifiable barriers or psychosocial needs.     Screening Interventions   Interventions Encouraged to exercise      Quality of Life Scores:     Quality of Life - 05/09/16 0755      Quality of Life Scores   Health/Function Pre 21.83 %   Socioeconomic Pre 24.43 %  Psych/Spiritual Pre 22.5 %   Family Pre 25.5 %   GLOBAL Pre 22.97 %      PHQ-9: Recent Review Flowsheet Data    Depression screen [facility] 2/9 06/20/2016 04/29/2016   Decreased Interest 0 0   Down, Depressed, Hopeless 0 0   PHQ - 2 Score 0 0      Psychosocial Evaluation and Intervention:     Psychosocial Evaluation - 04/29/16 1247      Psychosocial Evaluation & Interventions   Interventions Encouraged to exercise with the program and follow exercise prescription   Comments none identified   Continued Psychosocial Services Needed No      Psychosocial Re-Evaluation:     Psychosocial  Re-Evaluation    [place] Name 05/30/16 0716 07/01/16 1002 07/30/16 0722         Psychosocial Re-Evaluation   Interventions Encouraged to attend Pulmonary Rehabilitation for the exercise Encouraged to attend Pulmonary Rehabilitation for the exercise Encouraged to attend Pulmonary Rehabilitation for the exercise     Comments no psychosocial barriers identified within his first 30 days no psychosocial barriers identified during the last 30 days no psychosocial barriers identified during the last 30 days       Education: Education Goals: Education classes will be provided on a weekly basis, covering required topics. Participant will state understanding/return demonstration of topics presented.  Learning Barriers/Preferences:     Learning Barriers/Preferences - 04/29/16 1233      Learning Barriers/Preferences   Learning Barriers None   Learning Preferences Skilled Demonstration;Group Instruction      Education Topics: Risk Factor Reduction:  -Group instruction that is supported by a PowerPoint presentation. Instructor discusses the definition of a risk factor, different risk factors for pulmonary disease, and how the heart and lungs work together.     Nutrition for Pulmonary Patient:  -Group instruction provided by PowerPoint slides, verbal discussion, and written materials to support subject matter. The instructor gives an explanation and review of healthy diet recommendations, which includes a discussion on weight management, recommendations for fruit and vegetable consumption, as well as protein, fluid, caffeine, fiber, sodium, sugar, and alcohol. Tips for eating when patients are short of breath are discussed. Flowsheet Row PULMONARY REHAB OTHER RESPIRATORY from 07/25/2016 in [place]  Date  06/27/16  Educator  [place]  Instruction Review Code  2- meets goals/outcomes      Pursed Lip Breathing:  -Group instruction that is supported by demonstration and  informational handouts. Instructor discusses the benefits of pursed lip and diaphragmatic breathing and detailed demonstration on how to preform both.   Flowsheet Row PULMONARY REHAB OTHER RESPIRATORY from 07/25/2016 in [place]  Date  07/25/16  Educator  RT  Instruction Review Code  R- Review/reinforce      Oxygen Safety:  -Group instruction provided by PowerPoint, verbal discussion, and written material to support subject matter. There is an overview of "What is Oxygen" and "Why do we need it".  Instructor also reviews how to create a safe environment for oxygen use, the importance of using oxygen as prescribed, and the risks of noncompliance. There is a brief discussion on traveling with oxygen and resources the patient may utilize. Flowsheet Row PULMONARY REHAB OTHER RESPIRATORY from 07/25/2016 in [place]  Date  07/11/16  Educator  RN  Instruction Review Code  2- meets goals/outcomes      Oxygen Equipment:  -Group instruction provided by [facility] Staff utilizing handouts, written  materials, and Insurance underwriter. Flowsheet Row PULMONARY REHAB OTHER RESPIRATORY from 05/30/2016 in [place]  Date  05/16/16  Educator  [employer] rep  Instruction Review Code  2- meets goals/outcomes      Signs and Symptoms:  -Group instruction provided by written material and verbal discussion to support subject matter. Warning signs and symptoms of infection, stroke, and heart attack are reviewed and when to call the physician/911 reinforced. Tips for preventing the spread of infection discussed.   Advanced Directives:  -Group instruction provided by verbal instruction and written material to support subject matter. Instructor reviews Advanced Directive laws and proper instruction for filling out document.   Pulmonary Video:  -Group video education that reviews the importance of medication and  oxygen compliance, exercise, good nutrition, pulmonary hygiene, and pursed lip and diaphragmatic breathing for the pulmonary patient. Flowsheet Row PULMONARY REHAB OTHER RESPIRATORY from 07/25/2016 in [place]  Date  07/18/16  Instruction Review Code  2- meets goals/outcomes      Exercise for the Pulmonary Patient:  -Group instruction that is supported by a PowerPoint presentation. Instructor discusses benefits of exercise, core components of exercise, frequency, duration, and intensity of an exercise routine, importance of utilizing pulse oximetry during exercise, safety while exercising, and options of places to exercise outside of rehab.   Flowsheet Row PULMONARY REHAB OTHER RESPIRATORY from 05/30/2016 in [place]  Date  05/23/16  Educator  EP  Instruction Review Code  2- meets goals/outcomes      Pulmonary Medications:  -Verbally interactive group education provided by instructor with focus on inhaled medications and proper administration.   Anatomy and Physiology of the Respiratory System and Intimacy:  -Group instruction provided by PowerPoint, verbal discussion, and written material to support subject matter. Instructor reviews respiratory cycle and anatomical components of the respiratory system and their functions. Instructor also reviews differences in obstructive and restrictive respiratory diseases with examples of each. Intimacy, Sex, and Sexuality differences are reviewed with a discussion on how relationships can change when diagnosed with pulmonary disease. Common sexual concerns are reviewed. Flowsheet Row PULMONARY REHAB OTHER RESPIRATORY from 07/25/2016 in [place]  Date  06/06/16  Educator  RN  Instruction Review Code  2- meets goals/outcomes      Knowledge Questionnaire Score:     Knowledge Questionnaire Score - 05/09/16 0754      Knowledge Questionnaire Score   Pre  Score 10/13      Core Components/Risk Factors/Patient Goals at Admission:     Personal Goals and Risk Factors at Admission - 04/29/16 1239      Core Components/Risk Factors/Patient Goals on Admission   Increase Strength and Stamina Yes   Intervention Provide advice, education, support and counseling about physical activity/exercise needs.;Develop an individualized exercise prescription for aerobic and resistive training based on initial evaluation findings, risk stratification, comorbidities and participant's personal goals.   Expected Outcomes Achievement of increased cardiorespiratory fitness and enhanced flexibility, muscular endurance and strength shown through measurements of functional capacity and personal statement of participant.   Improve shortness of breath with ADL's Yes   Intervention Provide education, individualized exercise plan and daily activity instruction to help decrease symptoms of SOB with activities of daily living.   Expected Outcomes Short Term: Achieves a reduction of symptoms when performing activities of daily living.   Develop more efficient breathing techniques such as purse lipped breathing and diaphragmatic breathing; and practicing self-pacing  with activity Yes   Intervention Provide education, demonstration and support about specific breathing techniuqes utilized for more efficient breathing. Include techniques such as pursed lipped breathing, diaphragmatic breathing and self-pacing activity.   Expected Outcomes Short Term: Participant will be able to demonstrate and use breathing techniques as needed throughout daily activities.      Core Components/Risk Factors/Patient Goals Review:      Goals and Risk Factor Review    Row Name 04/29/16 1242 05/30/16 0717 07/01/16 1002 07/30/16 0721       Core Components/Risk Factors/Patient Goals Review   Personal Goals Review Increase Strength and Stamina;Develop more efficient breathing techniques such as purse  lipped breathing and diaphragmatic breathing and practicing self-pacing with activity.;Improve shortness of breath with ADL's Increase Strength and Stamina;Develop more efficient breathing techniques such as purse lipped breathing and diaphragmatic breathing and practicing self-pacing with activity.;Improve shortness of breath with ADL's Increase Strength and Stamina;Develop more efficient breathing techniques such as purse lipped breathing and diaphragmatic breathing and practicing self-pacing with activity.;Improve shortness of breath with ADL's  -    Review Increase in stamina through aerobic exercise see comment section on ITP see comment section on ITP see comment section on ITP    Expected Outcomes Improved strength and stamina see admission expected outcomes see admission expected outcomes see admission expected outcomes       Core Components/Risk Factors/Patient Goals at Discharge (Final Review):      Goals and Risk Factor Review - 07/30/16 0721      Core Components/Risk Factors/Patient Goals Review   Review see comment section on ITP   Expected Outcomes see admission expected outcomes      ITP Comments:   Comments: ITP REVIEW Pt is making expected progress toward pulmonary rehab goals after completing 19 sessions. Recommend continued exercise, life style modification, education, and utilization of breathing techniques to increase stamina and strength and decrease shortness of breath with exertion.

## 2016-08-06 ENCOUNTER — Encounter (HOSPITAL_COMMUNITY)
Admission: RE | Admit: 2016-08-06 | Discharge: 2016-08-06 | Disposition: A | Discharge status: Home / Self Care | Payer: Medicare Other | Source: Ambulatory Visit | Attending: Family Medicine | Admitting: Family Medicine

## 2016-08-06 VITALS — Wt 134.7 lb

## 2016-08-06 DIAGNOSIS — I272 Other secondary pulmonary hypertension: Secondary | ICD-10-CM | POA: Diagnosis not present

## 2016-08-06 DIAGNOSIS — J439 Emphysema, unspecified: Secondary | ICD-10-CM | POA: Diagnosis not present

## 2016-08-06 DIAGNOSIS — Z87891 Personal history of nicotine dependence: Secondary | ICD-10-CM | POA: Diagnosis not present

## 2016-08-06 DIAGNOSIS — Z79899 Other long term (current) drug therapy: Secondary | ICD-10-CM | POA: Diagnosis not present

## 2016-08-06 NOTE — Progress Notes (Signed)
Daily Session Note  Patient Details  Name: Andrew [name] MRN: [id number] Date of Birth: 1941-03-31 Referring Provider:   [name] Pulmonary Rehab Walk Test from 05/02/2016 in [place]  Referring Provider  Dr. Dorthy Cooler      Encounter Date: 08/06/2016  Check In:     Session Check In - 08/06/16 1026      Check-In   Location [facility]   Staff Present Rosebud Poles, RN, BSN;Elven Laboy, MS, ACSM RCEP, Exercise Physiologist;Lisa Ysidro Evert, RN;Portia Rollene Rotunda, RN, BSN   Supervising physician immediately available to respond to emergencies [facility] immediately available   Physician(s) Dr. Waldron Labs   Medication changes reported     No   Fall or balance concerns reported    No   Warm-up and Cool-down Performed as group-led instruction   Resistance Training Performed Yes     Pain Assessment   Currently in Pain? No/denies   Multiple Pain Sites No      Capillary Blood Glucose: No results found for this or any previous visit (from the past 24 hour(s)).      Exercise Prescription Changes - 08/06/16 1200      Response to Exercise   Blood Pressure (Admit) 112/52   Blood Pressure (Exercise) 130/60   Blood Pressure (Exit) 108/60   Heart Rate (Admit) 87 bpm   Heart Rate (Exercise) 111 bpm   Heart Rate (Exit) 90 bpm   Oxygen Saturation (Admit) 98 %   Oxygen Saturation (Exercise) 92 %   Oxygen Saturation (Exit) 98 %   Rating of Perceived Exertion (Exercise) 11   Perceived Dyspnea (Exercise) 1   Duration Progress to 45 minutes of aerobic exercise without signs/symptoms of physical distress   Intensity THRR unchanged     Progression   Progression Continue to progress workloads to maintain intensity without signs/symptoms of physical distress.     Resistance Training   Training Prescription Yes   Weight orange bands   Reps 10-12  10 minutes of strength training     Interval Training   Interval Training No     Oxygen   Oxygen Continuous   Liters 2     NuStep   Level 5   Minutes 30   METs 2.4     Track   Laps 13   Minutes 17     Goals Met:  Exercise tolerated well No report of cardiac concerns or symptoms Strength training completed today  Goals Unmet:  Not Applicable  Comments: Service time is from 10:30AM to 12:10PM    Dr. Rush Farmer is Medical Director for Pulmonary Rehab at [facility].

## 2016-08-08 ENCOUNTER — Encounter (HOSPITAL_COMMUNITY)
Admission: RE | Admit: 2016-08-08 | Discharge: 2016-08-08 | Disposition: A | Discharge status: Home / Self Care | Payer: Medicare Other | Source: Ambulatory Visit | Attending: Family Medicine | Admitting: Family Medicine

## 2016-08-08 VITALS — Wt 132.7 lb

## 2016-08-08 DIAGNOSIS — J439 Emphysema, unspecified: Secondary | ICD-10-CM

## 2016-08-08 DIAGNOSIS — Z87891 Personal history of nicotine dependence: Secondary | ICD-10-CM | POA: Diagnosis not present

## 2016-08-08 DIAGNOSIS — Z79899 Other long term (current) drug therapy: Secondary | ICD-10-CM | POA: Diagnosis not present

## 2016-08-08 DIAGNOSIS — I272 Other secondary pulmonary hypertension: Secondary | ICD-10-CM | POA: Diagnosis not present

## 2016-08-08 NOTE — Progress Notes (Signed)
Daily Session Note  Patient Details  Name: Andrew [name] MRN: [id number] Date of Birth: 03/07/1941 Referring Provider:   [name] Pulmonary Rehab Walk Test from 05/02/2016 in [place]  Referring Provider  Dr. Dorthy Cooler      Encounter Date: 08/08/2016  Check In:     Session Check In - 08/08/16 1025      Check-In   Location [facility]   Staff Present Su Hilt, MS, ACSM RCEP, Exercise Physiologist;Xabi Wittler Leonia Reeves, RN, BSN;Lisa Hughes, RN;Portia Rollene Rotunda, RN, BSN   Supervising physician immediately available to respond to emergencies [facility] immediately available   Physician(s) Dr. Dyann Kief   Medication changes reported     No   Fall or balance concerns reported    No   Warm-up and Cool-down Performed as group-led instruction   Resistance Training Performed Yes   VAD Patient? No     Pain Assessment   Currently in Pain? No/denies   Multiple Pain Sites No      Capillary Blood Glucose: No results found for this or any previous visit (from the past 24 hour(s)).   Goals Met:  Exercise tolerated well Strength training completed today  Goals Unmet:  Not Applicable  Comments: Service time is from 1030 to 1225    Dr. Rush Farmer is Medical Director for Pulmonary Rehab at [facility].

## 2016-08-09 DIAGNOSIS — R1310 Dysphagia, unspecified: Secondary | ICD-10-CM | POA: Diagnosis not present

## 2016-08-09 DIAGNOSIS — K219 Gastro-esophageal reflux disease without esophagitis: Secondary | ICD-10-CM | POA: Diagnosis not present

## 2016-08-13 ENCOUNTER — Encounter (HOSPITAL_COMMUNITY)
Admission: RE | Admit: 2016-08-13 | Discharge: 2016-08-13 | Disposition: A | Discharge status: Home / Self Care | Payer: Medicare Other | Source: Ambulatory Visit | Attending: Family Medicine | Admitting: Family Medicine

## 2016-08-13 VITALS — Wt 134.0 lb

## 2016-08-13 DIAGNOSIS — J439 Emphysema, unspecified: Secondary | ICD-10-CM | POA: Diagnosis not present

## 2016-08-13 DIAGNOSIS — Z79899 Other long term (current) drug therapy: Secondary | ICD-10-CM | POA: Diagnosis not present

## 2016-08-13 DIAGNOSIS — I272 Other secondary pulmonary hypertension: Secondary | ICD-10-CM | POA: Diagnosis not present

## 2016-08-13 DIAGNOSIS — Z87891 Personal history of nicotine dependence: Secondary | ICD-10-CM | POA: Diagnosis not present

## 2016-08-13 NOTE — Progress Notes (Signed)
Daily Session Note  Patient Details  Name: Andrew [name] MRN: [id number] Date of Birth: 02-23-1941 Referring Provider:   [name] Pulmonary Rehab Walk Test from 05/02/2016 in [place]  Referring Provider  Dr. Dorthy Cooler      Encounter Date: 08/13/2016  Check In:     Session Check In - 08/13/16 1022      Check-In   Location [facility]   Staff Present Su Hilt, MS, ACSM RCEP, Exercise Physiologist;Mekai Wilkinson Leonia Reeves, RN, BSN;Lisa Ysidro Evert, RN;Portia Rollene Rotunda, RN, BSN   Supervising physician immediately available to respond to emergencies [facility] immediately available   Physician(s) Dr. Dyann Kief   Medication changes reported     No   Fall or balance concerns reported    No   Warm-up and Cool-down Performed as group-led instruction   Resistance Training Performed Yes   VAD Patient? No     Pain Assessment   Currently in Pain? No/denies   Multiple Pain Sites No      Capillary Blood Glucose: No results found for this or any previous visit (from the past 24 hour(s)).      Exercise Prescription Changes - 08/13/16 1200      Response to Exercise   Blood Pressure (Admit) 120/64   Blood Pressure (Exercise) 124/80   Blood Pressure (Exit) 140/62   Heart Rate (Admit) 85 bpm   Heart Rate (Exercise) 116 bpm   Heart Rate (Exit) 93 bpm   Oxygen Saturation (Admit) 99 %   Oxygen Saturation (Exercise) 95 %   Oxygen Saturation (Exit) 98 %   Rating of Perceived Exertion (Exercise) 11   Perceived Dyspnea (Exercise) 1   Duration Progress to 45 minutes of aerobic exercise without signs/symptoms of physical distress   Intensity THRR unchanged     Progression   Progression Continue to progress workloads to maintain intensity without signs/symptoms of physical distress.     Resistance Training   Training Prescription Yes   Weight orange bands   Reps 10-12  10 minutes of strength training     Interval Training   Interval  Training No     Oxygen   Oxygen Continuous   Liters 2     NuStep   Level 5   Minutes 34   METs 2.3     Track   Laps 19   Minutes 17     Goals Met:  Exercise tolerated well Strength training completed today  Goals Unmet:  Not Applicable  Comments: Service time is from 1030 to 1210    Dr. Rush Farmer is Medical Director for Pulmonary Rehab at [facility].

## 2016-08-15 ENCOUNTER — Encounter (HOSPITAL_COMMUNITY)
Admission: RE | Admit: 2016-08-15 | Discharge: 2016-08-15 | Disposition: A | Discharge status: Home / Self Care | Payer: Medicare Other | Source: Ambulatory Visit | Attending: Family Medicine | Admitting: Family Medicine

## 2016-08-15 DIAGNOSIS — I272 Other secondary pulmonary hypertension: Secondary | ICD-10-CM | POA: Diagnosis not present

## 2016-08-15 DIAGNOSIS — J439 Emphysema, unspecified: Secondary | ICD-10-CM

## 2016-08-15 DIAGNOSIS — Z87891 Personal history of nicotine dependence: Secondary | ICD-10-CM | POA: Diagnosis not present

## 2016-08-15 DIAGNOSIS — Z79899 Other long term (current) drug therapy: Secondary | ICD-10-CM | POA: Diagnosis not present

## 2016-08-20 ENCOUNTER — Encounter (HOSPITAL_COMMUNITY): Admission: RE | Admit: 2016-08-20 | Payer: Medicare Other | Source: Ambulatory Visit

## 2016-08-21 NOTE — Progress Notes (Signed)
other [facility] Weekly Session   Patient Details  Name: [name] MRN: [id number] Date of Birth: 1941/03/01 Age: 75 y.o. PCP: Lujean Amel, MD  Vitals:   08/21/16 1245  Weight: 137 lb 12.8 oz (62.5 kg)        [facility] Weekly seesion - 08/21/16 1200      Weekly Session   Topic Discussed Hitting roadblocks   Minutes exercised this week 80 minutes  cardio   Classes attended to date 1   Comments recovering from pna        Vanita Ingles 08/21/2016, 12:46 PM

## 2016-08-22 ENCOUNTER — Encounter (HOSPITAL_COMMUNITY): Payer: Self-pay | Admitting: *Deleted

## 2016-08-22 ENCOUNTER — Other Ambulatory Visit: Payer: Self-pay | Admitting: Gastroenterology

## 2016-08-22 NOTE — Progress Notes (Signed)
Pt stated that he does not take Aspirin but made aware to stop all vitamins, fish oil and herbal medications.  Do not take any NSAIDs ie: Ibuprofen, Advil, Naproxen, BC and Goody Powder. Pt stated that he is dependent on supplemental oxygen ( 2L during the day and 3L at HS). Pt denies history of OSA but stated that he wears a  Bipap. Pt verbalized understanding of all pre-op instructions.

## 2016-08-23 ENCOUNTER — Ambulatory Visit (HOSPITAL_COMMUNITY): Payer: Medicare Other | Admitting: Emergency Medicine

## 2016-08-23 ENCOUNTER — Encounter (HOSPITAL_COMMUNITY)
Admission: RE | Disposition: A | Discharge status: Home / Self Care | Payer: Self-pay | Source: Ambulatory Visit | Attending: Gastroenterology

## 2016-08-23 ENCOUNTER — Ambulatory Visit (HOSPITAL_COMMUNITY)
Admission: RE | Admit: 2016-08-23 | Discharge: 2016-08-23 | Disposition: A | Discharge status: Home / Self Care | Payer: Medicare Other | Source: Ambulatory Visit | Attending: Gastroenterology | Admitting: Gastroenterology

## 2016-08-23 ENCOUNTER — Encounter (HOSPITAL_COMMUNITY): Payer: Self-pay | Admitting: *Deleted

## 2016-08-23 DIAGNOSIS — B3781 Candidal esophagitis: Secondary | ICD-10-CM | POA: Diagnosis not present

## 2016-08-23 DIAGNOSIS — Z9981 Dependence on supplemental oxygen: Secondary | ICD-10-CM | POA: Diagnosis not present

## 2016-08-23 DIAGNOSIS — B379 Candidiasis, unspecified: Secondary | ICD-10-CM | POA: Diagnosis not present

## 2016-08-23 DIAGNOSIS — Z87891 Personal history of nicotine dependence: Secondary | ICD-10-CM | POA: Diagnosis not present

## 2016-08-23 DIAGNOSIS — E785 Hyperlipidemia, unspecified: Secondary | ICD-10-CM | POA: Diagnosis not present

## 2016-08-23 DIAGNOSIS — N529 Male erectile dysfunction, unspecified: Secondary | ICD-10-CM | POA: Insufficient documentation

## 2016-08-23 DIAGNOSIS — R1310 Dysphagia, unspecified: Secondary | ICD-10-CM | POA: Diagnosis not present

## 2016-08-23 DIAGNOSIS — I10 Essential (primary) hypertension: Secondary | ICD-10-CM | POA: Diagnosis not present

## 2016-08-23 DIAGNOSIS — N400 Enlarged prostate without lower urinary tract symptoms: Secondary | ICD-10-CM | POA: Insufficient documentation

## 2016-08-23 DIAGNOSIS — J449 Chronic obstructive pulmonary disease, unspecified: Secondary | ICD-10-CM | POA: Insufficient documentation

## 2016-08-23 DIAGNOSIS — K449 Diaphragmatic hernia without obstruction or gangrene: Secondary | ICD-10-CM | POA: Insufficient documentation

## 2016-08-23 DIAGNOSIS — J9601 Acute respiratory failure with hypoxia: Secondary | ICD-10-CM | POA: Diagnosis not present

## 2016-08-23 DIAGNOSIS — K210 Gastro-esophageal reflux disease with esophagitis: Secondary | ICD-10-CM | POA: Diagnosis not present

## 2016-08-23 DIAGNOSIS — G4700 Insomnia, unspecified: Secondary | ICD-10-CM | POA: Diagnosis not present

## 2016-08-23 DIAGNOSIS — K219 Gastro-esophageal reflux disease without esophagitis: Secondary | ICD-10-CM | POA: Diagnosis not present

## 2016-08-23 HISTORY — DX: Unspecified hearing loss, unspecified ear: H91.90

## 2016-08-23 HISTORY — DX: Dependence on supplemental oxygen: Z99.81

## 2016-08-23 HISTORY — DX: Malignant (primary) neoplasm, unspecified: C80.1

## 2016-08-23 HISTORY — DX: Dysphagia, unspecified: R13.10

## 2016-08-23 HISTORY — DX: Pneumonia, unspecified organism: J18.9

## 2016-08-23 HISTORY — DX: Gastro-esophageal reflux disease without esophagitis: K21.9

## 2016-08-23 HISTORY — DX: Dependence on other enabling machines and devices: Z99.89

## 2016-08-23 HISTORY — PX: ESOPHAGOGASTRODUODENOSCOPY (EGD) WITH PROPOFOL: SHX5813

## 2016-08-23 SURGERY — ESOPHAGOGASTRODUODENOSCOPY (EGD) WITH PROPOFOL
Anesthesia: Monitor Anesthesia Care

## 2016-08-23 MED ORDER — LACTATED RINGERS IV SOLN
INTRAVENOUS | Status: DC
  Administered 2016-08-23 (×2): via INTRAVENOUS

## 2016-08-23 MED ORDER — SODIUM CHLORIDE 0.9 % IV SOLN: INTRAVENOUS | Status: DC

## 2016-08-23 MED ORDER — PROPOFOL 10 MG/ML IV BOLUS
INTRAVENOUS | Status: DC | PRN
  Administered 2016-08-23: 25 mg via INTRAVENOUS

## 2016-08-23 MED ORDER — PROPOFOL 500 MG/50ML IV EMUL
INTRAVENOUS | Status: DC | PRN
  Administered 2016-08-23: 50 ug/kg/min via INTRAVENOUS

## 2016-08-23 MED ORDER — BUTAMBEN-TETRACAINE-BENZOCAINE 2-2-14 % EX AERO
INHALATION_SPRAY | CUTANEOUS | Status: DC | PRN
  Administered 2016-08-23: 2 via TOPICAL

## 2016-08-23 NOTE — H&P (Signed)
Date of Initial H&P: 08/09/16  History reviewed, patient examined, no change in status, stable for surgery.

## 2016-08-23 NOTE — Discharge Instructions (Addendum)
YOU HAD AN ENDOSCOPIC PROCEDURE TODAY: Refer to the procedure report and other information in the discharge instructions given to you for any specific questions about what was found during the examination. If this information does not answer your questions, please call [facility] office at [phone] to clarify.   YOU SHOULD EXPECT: Some feelings of bloating in the abdomen. Passage of more gas than usual. Walking can help get rid of the air that was put into your GI tract during the procedure and reduce the bloating. If you had a lower endoscopy (such as a colonoscopy or flexible sigmoidoscopy) you may notice spotting of blood in your stool or on the toilet paper. Some abdominal soreness may be present for a day or two, also.  DIET: Your first meal following the procedure should be a light meal and then it is ok to progress to your normal diet. A half-sandwich or bowl of soup is an example of a good first meal. Heavy or fried foods are harder to digest and may make you feel nauseous or bloated. Drink plenty of fluids but you should avoid alcoholic beverages for 24 hours. If you had a esophageal dilation, please see attached instructions for diet.   ACTIVITY: Your care partner should take you home directly after the procedure. You should plan to take it easy, moving slowly for the rest of the day. You can resume normal activity the day after the procedure however YOU SHOULD NOT DRIVE, use power tools, machinery or perform tasks that involve climbing or major physical exertion for 24 hours (because of the sedation medicines used during the test).   SYMPTOMS TO REPORT IMMEDIATELY: A gastroenterologist can be reached at any hour. Please call [phone]  for any of the following symptoms:  Following lower endoscopy (colonoscopy, flexible sigmoidoscopy) Excessive amounts of blood in the stool  Significant tenderness, worsening of abdominal pains  Swelling of the abdomen that is new, acute  Fever of 100 or  higher  Following upper endoscopy (EGD, EUS, ERCP, esophageal dilation) Vomiting of blood or coffee ground material  New, significant abdominal pain  New, significant chest pain or pain under the shoulder blades  Painful or persistently difficult swallowing  New shortness of breath  Black, tarry-looking or red, bloody stools  FOLLOW UP:  If any biopsies were taken you will be contacted by phone or by letter within the next 1-3 weeks. Call [phone]  if you have not heard about the biopsies in 3 weeks.  Please also call with any specific questions about appointments or follow up tests. Will prescribe Nystatin swish and swallow (see bottle for directions) to treat mild yeast in the esophagus.

## 2016-08-23 NOTE — Op Note (Signed)
[facility] Patient Name: Andrew [name] Procedure Date : 08/23/2016 MRN: [id number] Attending MD: Lear Ng , MD Date of Birth: 11/04/1941 CSN: [id number] Age: 75 Admit Type: Outpatient Procedure:                Upper GI endoscopy Indications:              Dysphagia, Esophageal reflux Providers:                Lear Ng, MD, Malka So, RN, Alfonso Patten, Technician, Rejeana Brock, CRNA Referring MD:              Medicines:                Propofol per Anesthesia, Monitored Anesthesia Care Complications:            No immediate complications. Estimated Blood Loss:     Estimated blood loss: none. Procedure:                Pre-Anesthesia Assessment:                           - Prior to the procedure, a History and Physical                            was performed, and patient medications and                            allergies were reviewed. The patient's tolerance of                            previous anesthesia was also reviewed. The risks                            and benefits of the procedure and the sedation                            options and risks were discussed with the patient.                            All questions were answered, and informed consent                            was obtained. Prior Anticoagulants: The patient has                            taken no previous anticoagulant or antiplatelet                            agents. ASA Grade Assessment: III - A patient with                            severe systemic disease. After reviewing the risks  and benefits, the patient was deemed in                            satisfactory condition to undergo the procedure.                           After obtaining informed consent, the endoscope was                            passed under direct vision. Throughout the                            procedure, the patient's blood pressure,  pulse, and                            oxygen saturations were monitored continuously. The                            [id number]) scope was introduced through the                            mouth, and advanced to the second part of duodenum.                            The upper GI endoscopy was accomplished without                            difficulty. The patient tolerated the procedure                            fairly well. Scope In: Scope Out: Findings:      The Z-line was regular and was found 46 cm from the incisors.      Patchy candidiasis was found in the middle third of the esophagus and in       the lower third of the esophagus. Cells for cytology were obtained by       brushing.      A small hiatal hernia was present.      Localized mildly erythematous mucosa without bleeding was found in the       prepyloric region of the stomach.      The examined duodenum was normal. Impression:               - Z-line regular, 46 cm from the incisors.                           - Monilial esophagitis. Cells for cytology obtained.                           - Small hiatal hernia.                           - Erythematous mucosa in the prepyloric region of                            the  stomach.                           - Normal examined duodenum. Moderate Sedation:      N/A - MAC procedure Recommendation:           - Await pathology results.                           - Nystatin suspension 100,000 units PO QID for 1                            week.                           - Resume previous diet.                           - Continue present medications.                           - Advance diet as tolerated and full liquid diet. Procedure Code(s):        --- Professional ---                           [phone], Esophagogastroduodenoscopy, flexible,                            transoral; diagnostic, including collection of                            specimen(s) by brushing or washing,  when performed                            (separate procedure) Diagnosis Code(s):        --- Professional ---                           R13.10, Dysphagia, unspecified                           K21.9, Gastro-esophageal reflux disease without                            esophagitis                           B37.81, Candidal esophagitis                           K44.9, Diaphragmatic hernia without obstruction or                            gangrene                           K31.89, Other diseases of stomach and duodenum CPT copyright 2016 American Medical Association. All rights reserved. The codes documented in this report are preliminary and upon coder review may  be revised to  meet current compliance requirements. Lear Ng, MD 08/23/2016 9:54:51 AM This report has been signed electronically. Number of Addenda: 0

## 2016-08-23 NOTE — Transfer of Care (Signed)
Immediate Anesthesia Transfer of Care Note  Patient: Andrew [name]  Procedure(s) Performed: Procedure(s): ESOPHAGOGASTRODUODENOSCOPY (EGD) WITH PROPOFOL (N/A)  Patient Location: PACU  Anesthesia Type:MAC  Level of Consciousness: awake, alert  and oriented  Airway & Oxygen Therapy: Patient Spontanous Breathing  Post-op Assessment: Report given to RN, Post -op Vital signs reviewed and stable and Patient moving all extremities X 4  Post vital signs: Reviewed and stable  Last Vitals:  Vitals:   08/23/16 0750 08/23/16 0951  BP: (!) 121/54 (!) 107/59  Pulse: 65 67  Resp:  20  Temp: 36.6 C     Last Pain:  Vitals:   08/23/16 0750  TempSrc: Oral         Complications: No apparent anesthesia complications

## 2016-08-23 NOTE — Interval H&P Note (Signed)
History and Physical Interval Note:  08/23/2016 8:30 AM  Andrew [name]  has presented today for surgery, with the diagnosis of dysphasia  The various methods of treatment have been discussed with the patient and family. After consideration of risks, benefits and other options for treatment, the patient has consented to  Procedure(s): ESOPHAGOGASTRODUODENOSCOPY (EGD) WITH PROPOFOL (N/A) BALLOON DILATION (N/A) SAVORY DILATION (N/A) as a surgical intervention .  The patient's history has been reviewed, patient examined, no change in status, stable for surgery.  I have reviewed the patient's chart and labs.  Questions were answered to the patient's satisfaction.     [place] C.

## 2016-08-23 NOTE — Anesthesia Postprocedure Evaluation (Signed)
Anesthesia Post Note  Patient: Andrew [name]  Procedure(s) Performed: Procedure(s) (LRB): ESOPHAGOGASTRODUODENOSCOPY (EGD) WITH PROPOFOL (N/A)  Patient location during evaluation: Endoscopy Anesthesia Type: MAC Level of consciousness: awake and awake and alert Pain management: pain level controlled Vital Signs Assessment: post-procedure vital signs reviewed and stable Respiratory status: spontaneous breathing, nonlabored ventilation and respiratory function stable Cardiovascular status: blood pressure returned to baseline Anesthetic complications: no    Last Vitals:  Vitals:   08/23/16 1000 08/23/16 1010  BP: (!) 121/59 (!) 113/97  Pulse:    Resp:    Temp:      Last Pain:  Vitals:   08/23/16 0951  TempSrc: Oral                 [name] COKER

## 2016-08-23 NOTE — Anesthesia Preprocedure Evaluation (Signed)
Anesthesia Evaluation  Patient identified by MRN, date of birth, ID band Patient awake    Reviewed: Allergy & Precautions, NPO status , Patient's Chart, lab work & pertinent test results  Airway Mallampati: II  TM Distance: >3 FB Neck ROM: Full    Dental  (+) Teeth Intact   Pulmonary former smoker,    breath sounds clear to auscultation + decreased breath sounds      Cardiovascular hypertension,  Rhythm:Regular Rate:Normal     Neuro/Psych    GI/Hepatic   Endo/Other    Renal/GU      Musculoskeletal   Abdominal   Peds  Hematology   Anesthesia Other Findings   Reproductive/Obstetrics                             Anesthesia Physical Anesthesia Plan  ASA: III  Anesthesia Plan: MAC   Post-op Pain Management:    Induction: Intravenous  Airway Management Planned: Natural Airway and Nasal Cannula  Additional Equipment:   Intra-op Plan:   Post-operative Plan:   Informed Consent: I have reviewed the patients History and Physical, chart, labs and discussed the procedure including the risks, benefits and alternatives for the proposed anesthesia with the patient or authorized representative who has indicated his/her understanding and acceptance.   Dental advisory given  Plan Discussed with: CRNA and Anesthesiologist  Anesthesia Plan Comments:         Anesthesia Quick Evaluation

## 2016-08-26 ENCOUNTER — Encounter (HOSPITAL_COMMUNITY): Payer: Self-pay | Admitting: Gastroenterology

## 2016-08-28 NOTE — Progress Notes (Signed)
[facility] PREP Weekly Session   Patient Details  Name: Andrew [name] MRN: [id number] Date of Birth: May 30, 1941 Age: 75 y.o. PCP: Lujean Amel, MD  Vitals:   08/28/16 1230  Weight: 133 lb 9.6 oz (60.6 kg)        [facility] Weekly seesion - 08/28/16 1200      Weekly Session   Topic Discussed Water   Minutes exercised this week 95 minutes  cardio   Classes attended to date --  2   Comments grateful for health and recovery       Vanita Ingles 08/28/2016, 12:35 PM

## 2016-08-29 NOTE — Progress Notes (Signed)
Discharge Summary  Patient Details  Name: Andrew [name] MRN: [id number] Date of Birth: September 20, 1941 Referring Provider:   [name] Pulmonary Rehab Walk Test from 05/02/2016 in [place]  Referring Provider  Dr. Dorthy Cooler       Number of Visits: 24  Reason for Discharge:  Patient reached a stable level of exercise. Patient independent in their exercise.  Smoking History:  History  Smoking Status  . Former Smoker  . Packs/day: 0.00  . Years: 0.00  . Types: Cigarettes  . Quit date: 10/01/1984  Smokeless Tobacco  . Never Used    Diagnosis:  Pulmonary emphysema, unspecified emphysema type ([place])  ADL UCSD:     Pulmonary Assessment Scores    Row Name 05/09/16 0756 08/13/16 1431       ADL UCSD   ADL Phase Entry Exit    SOB Score total 66 45       Initial Exercise Prescription:     Initial Exercise Prescription - 05/03/16 1600      Date of Initial Exercise RX and Referring Provider   Date 05/02/16   Referring Provider Dr. Dorthy Cooler     Oxygen   Oxygen Continuous   Liters 2     Bike   Level 0.3   Minutes 15     NuStep   Level 2   Minutes 15   METs 1.7     Track   Laps 7   Minutes 15     Prescription Details   Frequency (times per week) 2   Duration Progress to 45 minutes of aerobic exercise without signs/symptoms of physical distress     Intensity   THRR 40-80% of Max Heartrate 58-115   Ratings of Perceived Exertion 11-13   Perceived Dyspnea 0-4     Progression   Progression Continue progressive overload as per policy without signs/symptoms or physical distress.     Resistance Training   Training Prescription Yes   Weight orange bands   Reps 10-12      Discharge Exercise Prescription (Final Exercise Prescription Changes):     Exercise Prescription Changes - 08/13/16 1200      Response to Exercise   Blood Pressure (Admit) 120/64   Blood Pressure (Exercise) 124/80   Blood Pressure (Exit) 140/62   Heart Rate (Admit) 85 bpm   Heart Rate (Exercise) 116 bpm   Heart Rate (Exit) 93 bpm   Oxygen Saturation (Admit) 99 %   Oxygen Saturation (Exercise) 95 %   Oxygen Saturation (Exit) 98 %   Rating of Perceived Exertion (Exercise) 11   Perceived Dyspnea (Exercise) 1   Duration Progress to 45 minutes of aerobic exercise without signs/symptoms of physical distress   Intensity THRR unchanged     Progression   Progression Continue to progress workloads to maintain intensity without signs/symptoms of physical distress.     Resistance Training   Training Prescription Yes   Weight orange bands   Reps 10-12  10 minutes of strength training     Interval Training   Interval Training No     Oxygen   Oxygen Continuous   Liters 2     NuStep   Level 5   Minutes 34   METs 2.3     Track   Laps 19   Minutes 17      Functional Capacity:     6 Minute Walk    Row Name 05/02/16 1704 08/15/16 1557       6 Minute  Walk   Phase Initial Discharge    Distance 900 feet 1200 feet    Walk Time 5.45 minutes 6 minutes    # of Rest Breaks 1 0    MPH 1.77 2.27    METS 2.77 2.68    RPE 13 12    Perceived Dyspnea  2 1    VO2 Peak 9.72  -    Symptoms No No    Resting HR 111 bpm 80 bpm    Resting BP 130/60 96/50    Max Ex. HR 126 bpm 113 bpm    Max Ex. BP 160/70 150/60    2 Minute Post BP 160/70  -      Interval HR   Baseline HR 111 80    1 Minute HR 119 90    2 Minute HR 122 103    3 Minute HR 126 113    4 Minute HR 123 111    5 Minute HR 120 105    6 Minute HR 118 115    2 Minute Post HR 116 102    Interval Heart Rate? Yes Yes      Interval Oxygen   Interval Oxygen? Yes Yes    Baseline Oxygen Saturation % 94 % 94 %    Baseline Liters of Oxygen 2 L 2 L    1 Minute Oxygen Saturation % 119 % 98 %    1 Minute Liters of Oxygen 2 L 2 L    2 Minute Oxygen Saturation % 122 % 98 %    2 Minute Liters of Oxygen 2 L 2 L    3 Minute Oxygen Saturation % 126 % 97 %    3 Minute Liters of  Oxygen 2 L 2 L    4 Minute Oxygen Saturation % 123 % 94 %    4 Minute Liters of Oxygen 2 L 2 L    5 Minute Oxygen Saturation % 120 % 93 %    5 Minute Liters of Oxygen 2 L 2 L    6 Minute Oxygen Saturation % 118 % 91 %    6 Minute Liters of Oxygen 2 L 2 L    2 Minute Post Oxygen Saturation % 116 % 97 %    2 Minute Post Liters of Oxygen 2 L 2 L       Psychological, QOL, Others - Outcomes: PHQ 2/9: Depression screen [facility] 2/9 08/15/2016 06/20/2016 04/29/2016  Decreased Interest 0 0 0  Down, Depressed, Hopeless 0 0 0  PHQ - 2 Score 0 0 0    Quality of Life:     Quality of Life - 08/13/16 1428      Quality of Life Scores   Health/Function Post 27.21 %   Socioeconomic Post 23.5 %   Psych/Spiritual Post 28.29 %   Family Post 26.25 %   GLOBAL Post 26.42 %      Personal Goals: Goals established at orientation with interventions provided to work toward goal.     Personal Goals and Risk Factors at Admission - 04/29/16 1239      Core Components/Risk Factors/Patient Goals on Admission   Increase Strength and Stamina Yes   Intervention Provide advice, education, support and counseling about physical activity/exercise needs.;Develop an individualized exercise prescription for aerobic and resistive training based on initial evaluation findings, risk stratification, comorbidities and participant's personal goals.   Expected Outcomes Achievement of increased cardiorespiratory fitness and enhanced flexibility, muscular endurance and strength shown through measurements of  functional capacity and personal statement of participant.   Improve shortness of breath with ADL's Yes   Intervention Provide education, individualized exercise plan and daily activity instruction to help decrease symptoms of SOB with activities of daily living.   Expected Outcomes Short Term: Achieves a reduction of symptoms when performing activities of daily living.   Develop more efficient breathing techniques such as  purse lipped breathing and diaphragmatic breathing; and practicing self-pacing with activity Yes   Intervention Provide education, demonstration and support about specific breathing techniuqes utilized for more efficient breathing. Include techniques such as pursed lipped breathing, diaphragmatic breathing and self-pacing activity.   Expected Outcomes Short Term: Participant will be able to demonstrate and use breathing techniques as needed throughout daily activities.       Personal Goals Discharge:     Goals and Risk Factor Review    Row Name 04/29/16 1242 05/30/16 0717 07/01/16 1002 07/30/16 0721       Core Components/Risk Factors/Patient Goals Review   Personal Goals Review Increase Strength and Stamina;Develop more efficient breathing techniques such as purse lipped breathing and diaphragmatic breathing and practicing self-pacing with activity.;Improve shortness of breath with ADL's Increase Strength and Stamina;Develop more efficient breathing techniques such as purse lipped breathing and diaphragmatic breathing and practicing self-pacing with activity.;Improve shortness of breath with ADL's Increase Strength and Stamina;Develop more efficient breathing techniques such as purse lipped breathing and diaphragmatic breathing and practicing self-pacing with activity.;Improve shortness of breath with ADL's  -    Review Increase in stamina through aerobic exercise see comment section on ITP see comment section on ITP see comment section on ITP    Expected Outcomes Improved strength and stamina see admission expected outcomes see admission expected outcomes see admission expected outcomes       Nutrition & Weight - Outcomes:     Pre Biometrics - 04/29/16 1235      Pre Biometrics   Grip Strength 28 kg       Nutrition:     Nutrition Therapy & Goals - 07/16/16 1516      Nutrition Therapy   Diet High Calorie, High Protein diet     Personal Nutrition Goals   Personal Goal #1 1-2 lb  wt gain per week to a wt gain goal of 6-24 lb at graduation from [place], educate and counsel regarding individualized specific dietary modifications aiming towards targeted core components such as weight, hypertension, lipid management, diabetes, heart failure and other comorbidities.;Nutrition handout(s) given to patient.  High Calorie, High Protein diet; Suggestions for increasing calories and protein; High Calorie, High Protein recipes; and Pre-diabetes   Expected Outcomes Long Term Goal: Adherence to prescribed nutrition plan.;Short Term Goal: A plan has been developed with personal nutrition goals set during dietitian appointment.      Nutrition Discharge:     Nutrition Assessments - 08/15/16 0943      Rate Your Plate Scores   Pre Score 42   Post Score 39      Education Questionnaire Score:     Knowledge Questionnaire Score - 08/13/16 1431      Knowledge Questionnaire Score   Post Score 11/13      Patient has successfully graduated from the pulmonary rehab program. He met all of his rehab goals. He plans to continue to exercise post discharge thru the PREP program at [facility].

## 2016-08-29 NOTE — Addendum Note (Signed)
Encounter addended by: Benedetto Goad, RN on: 08/29/2016  7:39 AM<BR>    Actions taken: Episode resolved

## 2016-09-03 ENCOUNTER — Telehealth: Payer: Self-pay | Admitting: Pulmonary Disease

## 2016-09-03 DIAGNOSIS — R0600 Dyspnea, unspecified: Secondary | ICD-10-CM

## 2016-09-03 DIAGNOSIS — I272 Pulmonary hypertension, unspecified: Secondary | ICD-10-CM

## 2016-09-03 DIAGNOSIS — Z8679 Personal history of other diseases of the circulatory system: Secondary | ICD-10-CM

## 2016-09-03 DIAGNOSIS — J9611 Chronic respiratory failure with hypoxia: Secondary | ICD-10-CM

## 2016-09-03 DIAGNOSIS — J438 Other emphysema: Secondary | ICD-10-CM

## 2016-09-03 MED ORDER — SILDENAFIL CITRATE 20 MG PO TABS: 20.0000 mg | ORAL_TABLET | Freq: Two times a day (BID) | ORAL | 3 refills | Status: DC

## 2016-09-03 NOTE — Telephone Encounter (Signed)
Per SN---ok to give refill of the revatio for 90 day supply.    Called and spoke with pt and he stated that he just needed a 90 day supply of the revatio with refills.  He is enrolled in the [employer] and gets this for free.  I called [facility] at [phone] and they stated that they would need a new rx.  This will need to be faxed to [facility] at [phone] with a cover sheet and demo on pt.  Pt is aware that this has been done and nothing further is needed.

## 2016-09-03 NOTE — Telephone Encounter (Signed)
Patient states that he spoke with Dr. Lenna Gilford about refilling his Revatio for a 1 year supply.  States that Dr. Lenna Gilford advised he would be okay refilling this for him.  Advised patient that I would have to ask Dr. Jeannine Kitten approval and would call him back.   Dr. Lenna Gilford, please advise if okay to refill patient's Revatio for 1 year supply?  Current Outpatient Prescriptions on File Prior to Visit  Medication Sig Dispense Refill  . ADVAIR DISKUS 250-50 MCG/DOSE AEPB Inhale 1 puff into the lungs 2 (two) times daily. 60 each 2  . albuterol (PROVENTIL HFA;VENTOLIN HFA) 108 (90 Base) MCG/ACT inhaler Inhale 2 puffs into the lungs every 6 (six) hours as needed for wheezing or shortness of breath. 1 Inhaler 2  . diltiazem (CARTIA XT) 240 MG 24 hr capsule Take 240 mg by mouth at bedtime.    Marland Kitchen esomeprazole (NEXIUM) 20 MG capsule Take 20 mg by mouth 2 (two) times daily before a meal.     . finasteride (PROSCAR) 5 MG tablet Take 5 mg by mouth at bedtime.     Marland Kitchen ipratropium-albuterol (DUONEB) 0.5-2.5 (3) MG/3ML SOLN Take 3 mLs by nebulization every 6 (six) hours as needed. Dx: J44.9 (Patient taking differently: Take 3 mLs by nebulization every 6 (six) hours as needed (shortness of breath/ wheezing). Dx: J44.9) 360 mL 12  . liver oil-zinc oxide (DESITIN) 40 % ointment Apply 1 application topically See admin instructions. Apply to left butt cheek for wound care approximately every 4 hours while awake    . Multiple Vitamins-Minerals (MULTIVITAMIN WITH MINERALS) tablet Take 1 tablet by mouth daily.    Marland Kitchen OVER THE COUNTER MEDICATION Apply 1 application topically See admin instructions. Protective ointment from home health care - apply approximately every 4 hours to left butt cheek for wound care    . OXYGEN Inhale 2-3 L into the lungs continuous. 2L daytime, 3L at night    . polyethylene glycol (MIRALAX / GLYCOLAX) packet Take 17 g by mouth daily. (Patient taking differently: Take 17 g by mouth at bedtime. Mix in 8 oz liquid  and drink) 14 each 1  . rosuvastatin (CRESTOR) 10 MG tablet Take 10 mg by mouth daily.    . sildenafil (REVATIO) 20 MG tablet Take 20 mg by mouth 2 (two) times daily.     Marland Kitchen SPIRIVA HANDIHALER 18 MCG inhalation capsule INHALE THE CONTENTS OF 1 CAPSULE EVERY DAY (Patient taking differently: INHALE THE CONTENTS OF 1 CAPSULE EVERY DAY AT NOON) 90 capsule 2  . tamsulosin (FLOMAX) 0.4 MG CAPS capsule Take 1 capsule (0.4 mg total) by mouth daily after breakfast. 30 capsule 0  . temazepam (RESTORIL) 30 MG capsule Take 30 mg by mouth at bedtime.    . vitamin C (ASCORBIC ACID) 500 MG tablet Take 500 mg by mouth daily.    Marland Kitchen VITAMIN E PO Take 1 capsule by mouth daily.     No current facility-administered medications on file prior to visit.   Marland Kitchen No Known Allergies

## 2016-09-04 NOTE — Progress Notes (Signed)
[facility] PREP Weekly Session   Patient Details  Name: Andrew [name] MRN: [id number] Date of Birth: 04/29/1941 Age: 75 y.o. PCP: Lujean Amel, MD  Vitals:   09/04/16 1339  Weight: 132 lb 12.8 oz (60.2 kg)        [facility] Weekly seesion - 09/04/16 1300      Weekly Session   Topic Discussed Water   Minutes exercised this week 180 minutes  90 cardio/90 strength   Classes attended to date 3   Comments "grateful for health"       Vanita Ingles 09/04/2016, 1:40 PM

## 2016-09-11 NOTE — Progress Notes (Signed)
[facility] PREP Weekly Session   Patient Details  Name: Andrew [name] MRN: [id number] Date of Birth: January 26, 1941 Age: 75 y.o. PCP: Lujean Amel, MD  Vitals:   09/11/16 1312  Weight: 135 lb (61.2 kg)        [facility] Weekly seesion - 09/11/16 1300      Weekly Session   Topic Discussed Other  fat and calorie detective   Minutes exercised this week 180 minutes  90 cardio/90 strenght   Classes attended to date 4   Comments "grateful for health and gained weight." "Struggling with drinking water"       Vanita Ingles 09/11/2016, 1:13 PM

## 2016-09-18 NOTE — Progress Notes (Signed)
[facility] PREP Weekly Session   Patient Details  Name: Andrew [name] MRN: [id number] Date of Birth: July 24, 1941 Age: 75 y.o. PCP: Lujean Amel, MD  Vitals:   09/18/16 1239  Weight: 133 lb (60.3 kg)        [facility] Weekly seesion - 09/18/16 1200      Weekly Session   Topic Discussed Healthy eating tips   Minutes exercised this week 240 minutes  90 cardio/150 strength   Classes attended to date 5   Comments "grateful for health but struggling with gaining weight"       Vanita Ingles 09/18/2016, 12:40 PM

## 2016-09-25 NOTE — Progress Notes (Signed)
[facility] PREP Weekly Session   Patient Details  Name: Andrew [name] MRN: [id number] Date of Birth: 1941-05-22 Age: 75 y.o. PCP: Lujean Amel, MD  Vitals:   09/25/16 1500  Weight: 133 lb 12.8 oz (60.7 kg)        [facility] Weekly seesion - 09/25/16 1500      Weekly Session   Topic Discussed Other ways to be active   Minutes exercised this week 320 minutes  90 cardio/230 strength   Classes attended to date 6   Comments "eating more veggies.  Still struggling with drinking enough water"       Vanita Ingles 09/25/2016, 3:02 PM

## 2016-10-02 NOTE — Progress Notes (Signed)
[facility] PREP Weekly Session   Patient Details  Name: Andrew [name] MRN: [id number] Date of Birth: 1941-05-26 Age: 75 y.o. PCP: Lujean Amel, MD  Vitals:   10/02/16 1233  Weight: 135 lb (61.2 kg)        [facility] Weekly seesion - 10/02/16 1200      Weekly Session   Topic Discussed Eating for the season   Minutes exercised this week 360 minutes  90 cardio/270 strength   Classes attended to date 7   Comments states he ate more veggies this past week.       Vanita Ingles 10/02/2016, 12:34 PM

## 2016-10-15 DIAGNOSIS — R1310 Dysphagia, unspecified: Secondary | ICD-10-CM | POA: Diagnosis not present

## 2016-10-16 NOTE — Progress Notes (Signed)
[facility] PREP Weekly Session   Patient Details  Name: Andrew [name] MRN: [id number] Date of Birth: 02/05/1941 Age: 75 y.o. PCP: Lujean Amel, MD  Vitals:   10/16/16 1251  Weight: 133 lb 3.2 oz (60.4 kg)        [facility] Weekly seesion - 10/16/16 1200      Weekly Session   Topic Discussed Health habits   Minutes exercised this week 450 minutes  90 cardio/360 strength   Classes attended to date 8   Comments "struggling to gain weight"       Vanita Ingles 10/16/2016, 12:52 PM

## 2016-10-17 ENCOUNTER — Telehealth: Payer: Self-pay | Admitting: Pulmonary Disease

## 2016-10-17 NOTE — Telephone Encounter (Signed)
Will forward paper work to SN to have this completed and will call pt once this is ready to be picked up.

## 2016-10-21 ENCOUNTER — Telehealth: Payer: Self-pay | Admitting: Pulmonary Disease

## 2016-10-21 DIAGNOSIS — R0600 Dyspnea, unspecified: Secondary | ICD-10-CM

## 2016-10-21 DIAGNOSIS — J438 Other emphysema: Secondary | ICD-10-CM

## 2016-10-21 DIAGNOSIS — J9611 Chronic respiratory failure with hypoxia: Secondary | ICD-10-CM

## 2016-10-21 DIAGNOSIS — Z8679 Personal history of other diseases of the circulatory system: Secondary | ICD-10-CM

## 2016-10-21 DIAGNOSIS — I272 Pulmonary hypertension, unspecified: Secondary | ICD-10-CM

## 2016-10-21 NOTE — Telephone Encounter (Signed)
These papers are on SN desk to sign.

## 2016-10-21 NOTE — Telephone Encounter (Signed)
Will fill these out for the pt and have SN sign once done.

## 2016-10-21 NOTE — Telephone Encounter (Signed)
Paperwork has been completed.  I have notified pt and he will come and pick these up. Nothing further is needed.

## 2016-10-23 MED ORDER — SILDENAFIL CITRATE 20 MG PO TABS: 20.0000 mg | ORAL_TABLET | Freq: Two times a day (BID) | ORAL | 3 refills | Status: DC

## 2016-10-23 NOTE — Telephone Encounter (Signed)
These forms have been completed by SN and I have called to make the pt aware.  He will pick up copies of these later this week.

## 2016-10-23 NOTE — Progress Notes (Signed)
[facility] PREP Weekly Session   Patient Details  Name: Andrew [name] MRN: [id number] Date of Birth: 04-Apr-1941 Age: 75 y.o. PCP: Lujean Amel, MD  Vitals:   10/23/16 1324  Weight: 134 lb 12.8 oz (61.1 kg)        [facility] Weekly seesion - 10/23/16 1300      Weekly Session   Topic Discussed Restaurant Eating   Minutes exercised this week 405 minutes  90 cardio/315 strength   Classes attended to date 9   Comments "Grateful for health but struggling to maintain weight"       Vanita Ingles 10/23/2016, 1:25 PM

## 2016-10-24 DIAGNOSIS — L905 Scar conditions and fibrosis of skin: Secondary | ICD-10-CM | POA: Diagnosis not present

## 2016-10-24 DIAGNOSIS — L570 Actinic keratosis: Secondary | ICD-10-CM | POA: Diagnosis not present

## 2016-10-24 DIAGNOSIS — Z85828 Personal history of other malignant neoplasm of skin: Secondary | ICD-10-CM | POA: Diagnosis not present

## 2016-10-24 DIAGNOSIS — D1801 Hemangioma of skin and subcutaneous tissue: Secondary | ICD-10-CM | POA: Diagnosis not present

## 2016-10-24 DIAGNOSIS — L821 Other seborrheic keratosis: Secondary | ICD-10-CM | POA: Diagnosis not present

## 2016-10-31 ENCOUNTER — Telehealth: Payer: Self-pay | Admitting: Pulmonary Disease

## 2016-10-31 NOTE — Telephone Encounter (Signed)
Called and spoke to pt. Pt states he dropped off a form today that needs to be completed. Pt states a form was already completed but there was one page that was not completed filled out, this is what the pt brought by today 10/31/16. Pt states the forms are to help the pt get his medications. Pt is requesting we fax the form once completed and then call the pt to pick up the original.   Leigh please advise if you have this. Thanks.

## 2016-11-04 NOTE — Telephone Encounter (Signed)
Form has been completed by SN and this has been faxed.  I have left this up front for the pt to pick up.  Pt is aware.

## 2016-11-06 NOTE — Progress Notes (Signed)
[facility] PREP Weekly Session   Patient Details  Name: Andrew [name] MRN: [id number] Date of Birth: 03/12/1941 Age: 75 y.o. PCP: Lujean Amel, MD  Vitals:   11/06/16 1345  Weight: 136 lb 6.4 oz (61.9 kg)        [facility] Weekly seesion - 11/06/16 1300      Weekly Session   Topic Discussed Importance of resistance training   Minutes exercised this week 390 minutes  90 cardio/300 strength   Classes attended to date 10   Comments "greatful for health & recovery"  nutrition celebration: "eating more"       Vanita Ingles 11/06/2016, 1:46 PM

## 2016-11-25 ENCOUNTER — Encounter: Payer: Self-pay | Admitting: Pulmonary Disease

## 2016-11-25 ENCOUNTER — Ambulatory Visit (INDEPENDENT_AMBULATORY_CARE_PROVIDER_SITE_OTHER)
Admission: RE | Admit: 2016-11-25 | Discharge: 2016-11-25 | Disposition: A | Discharge status: Home / Self Care | Payer: Medicare Other | Source: Ambulatory Visit | Attending: Pulmonary Disease | Admitting: Pulmonary Disease

## 2016-11-25 ENCOUNTER — Other Ambulatory Visit (INDEPENDENT_AMBULATORY_CARE_PROVIDER_SITE_OTHER): Payer: Medicare Other

## 2016-11-25 ENCOUNTER — Ambulatory Visit (INDEPENDENT_AMBULATORY_CARE_PROVIDER_SITE_OTHER): Payer: Medicare Other | Admitting: Pulmonary Disease

## 2016-11-25 VITALS — BP 104/62 | HR 81 | Temp 98.4°F | Resp 18 | Ht 67.0 in | Wt 138.0 lb

## 2016-11-25 DIAGNOSIS — I272 Pulmonary hypertension, unspecified: Secondary | ICD-10-CM | POA: Diagnosis not present

## 2016-11-25 DIAGNOSIS — E611 Iron deficiency: Secondary | ICD-10-CM

## 2016-11-25 DIAGNOSIS — J9611 Chronic respiratory failure with hypoxia: Secondary | ICD-10-CM | POA: Diagnosis not present

## 2016-11-25 DIAGNOSIS — E538 Deficiency of other specified B group vitamins: Secondary | ICD-10-CM

## 2016-11-25 DIAGNOSIS — E162 Hypoglycemia, unspecified: Secondary | ICD-10-CM

## 2016-11-25 DIAGNOSIS — J439 Emphysema, unspecified: Secondary | ICD-10-CM | POA: Diagnosis not present

## 2016-11-25 DIAGNOSIS — R531 Weakness: Secondary | ICD-10-CM

## 2016-11-25 DIAGNOSIS — J441 Chronic obstructive pulmonary disease with (acute) exacerbation: Secondary | ICD-10-CM

## 2016-11-25 DIAGNOSIS — R05 Cough: Secondary | ICD-10-CM | POA: Diagnosis not present

## 2016-11-25 LAB — VITAMIN B12: Vitamin B-12: 661 pg/mL (ref 211–911)

## 2016-11-25 LAB — COMPREHENSIVE METABOLIC PANEL
ALT: 21 U/L (ref 0–53)
AST: 17 U/L (ref 0–37)
Albumin: 4.2 g/dL (ref 3.5–5.2)
Alkaline Phosphatase: 86 U/L (ref 39–117)
BILIRUBIN TOTAL: 0.3 mg/dL (ref 0.2–1.2)
BUN: 19 mg/dL (ref 6–23)
CO2: 28 meq/L (ref 19–32)
CREATININE: 0.94 mg/dL (ref 0.40–1.50)
Calcium: 9.3 mg/dL (ref 8.4–10.5)
Chloride: 103 mEq/L (ref 96–112)
GFR: 82.66 mL/min (ref >60.00)
GLUCOSE: 108 mg/dL — AB (ref 70–99)
Potassium: 4.5 mEq/L (ref 3.5–5.1)
SODIUM: 139 meq/L (ref 135–145)
Total Protein: 6.9 g/dL (ref 6.0–8.3)

## 2016-11-25 LAB — CBC WITH DIFFERENTIAL/PLATELET
BASOS PCT: 0.4 % (ref 0.0–3.0)
Basophils Absolute: 0 10*3/uL (ref 0.0–0.1)
EOS ABS: 0.1 10*3/uL (ref 0.0–0.7)
Eosinophils Relative: 0.8 % (ref 0.0–5.0)
HEMATOCRIT: 36.9 % — AB (ref 39.0–52.0)
HEMOGLOBIN: 12.3 g/dL — AB (ref 13.0–17.0)
Lymphocytes Relative: 15.1 % (ref 12.0–46.0)
Lymphs Abs: 1.1 10*3/uL (ref 0.7–4.0)
MCHC: 33.3 g/dL (ref 30.0–36.0)
MCV: 90 fl (ref 78.0–100.0)
MONO ABS: 0.6 10*3/uL (ref 0.1–1.0)
Monocytes Relative: 9 % (ref 3.0–12.0)
Neutro Abs: 5.4 10*3/uL (ref 1.4–7.7)
Neutrophils Relative %: 74.7 % (ref 43.0–77.0)
Platelets: 222 10*3/uL (ref 150.0–400.0)
RBC: 4.1 Mil/uL — ABNORMAL LOW (ref 4.22–5.81)
RDW: 16.2 % — AB (ref 11.5–15.5)
WBC: 7.2 10*3/uL (ref 4.0–10.5)

## 2016-11-25 LAB — FERRITIN: Ferritin: 8.7 ng/mL — ABNORMAL LOW (ref 22.0–322.0)

## 2016-11-25 LAB — IBC PANEL
IRON: 31 ug/dL — AB (ref 42–165)
Saturation Ratios: 6.6 % — ABNORMAL LOW (ref 20.0–50.0)
TRANSFERRIN: 337 mg/dL (ref 212.0–360.0)

## 2016-11-25 LAB — HEMOGLOBIN A1C: HEMOGLOBIN A1C: 6.1 % (ref 4.6–6.5)

## 2016-11-25 MED ORDER — PREDNISONE 20 MG PO TABS: ORAL_TABLET | ORAL | 0 refills | Status: DC

## 2016-11-25 MED ORDER — LEVOFLOXACIN 500 MG PO TABS: 500.0000 mg | ORAL_TABLET | Freq: Every day | ORAL | 0 refills | Status: DC

## 2016-11-25 MED ORDER — METHYLPREDNISOLONE ACETATE 80 MG/ML IJ SUSP
80.0000 mg | Freq: Once | INTRAMUSCULAR | Status: AC
  Administered 2016-11-25: 80 mg via INTRAMUSCULAR

## 2016-11-25 MED ORDER — FLUCONAZOLE 100 MG PO TABS: 100.0000 mg | ORAL_TABLET | Freq: Every day | ORAL | 0 refills | Status: DC

## 2016-11-25 NOTE — Patient Instructions (Signed)
Today we updated your med list in our EPIC system...    Continue your current medications the same...  Today we checked your O2sats on your oxygen while walking... We also did a follow up CXR & blood work...    We will contact you w/ the results when available...   We decided to treat you w/ a tapering course of oral PREDNISONE>    Start the Pred 20mg  tabs w/ one tab twice daily for 5 days...    Then decrease to one tab each AM for 5 days...    Then decrease to 1/2 tab each AM for 5 days...    Then decrease to 1/2 tab every other day til gone (1/2, 0, 1/2, 0, etc)...  We wrote for an antibiotic LEVAQUIN 500mg  take one tab daily til gone...  And in light of your recent esophageal prob we will add DIFLUCAN 100mg  take one tab daily til gone...  Continue your NEBULIZER three times daily...    And continue the [place] as you are doing...  Call for any questions >> BUT IF YOU ARE NOT RESPONDING AS HOPED- you need to go to the ER for admission.  Let's plan a follow up visit in 3-4 weeks, sooner if needed for problems.Marland KitchenMarland Kitchen

## 2016-11-28 NOTE — Progress Notes (Signed)
Subjective:     Patient ID: Andrew [name], male   DOB: October 17, 1941, 76 y.o.   MRN: [id number]  HPI 76 y/o WM, an ex-smoker quit in 1986, w/ severe bullous emphysema & hx of RUL bullous resection in 2007; Hx both hypercarbic & hypoxemic resp failure w/ cor pulmonale & secondary pulm HTN on Revatio x yrs; He has been stable for >10 yrs on the same pulm regimen as he has [place] living in [place], [place], and [place]...   ~  October 02, 2015:  Initial pulmonary evaluation by SN>  His PCP is Dr. Lujean Amel, [name]...       Andrew [name] is a 76 y/o gentleman from [place]- moved here to [place] ~83mo ago to live w/ his daughter & son-in-law;  He has a long convoluted history & we have none of his prev objective data to review;  He tells me that he has known about COPD/Emphysema for >10 yrs and in 2007 he had right thoracotomy & "bleb-ectomy";  After this procedure he was placed on Oxygen at 2L/min and BiPAP to use at night, along w/ ADVAIR250Bid & SPIRIVA daily, plus REVATIO20Tid for pulmonary HTN;  He was also treated w/ NEBS for about 1-45yrs then this was discontinued;  He has pretty much been on this same regimen for the past 9 yrs w/o much change, despite or maybe because he has moved around a lot- [place] (surg done there in 2007), to [place], back to [place], on to [place] for 6 yrs, then back to [place] over the last yr or so...  He describes himself as being rock-solid stable on this exact regimen since 2007- he had Cath (?left & right heart) in 2007, told 1 blockage, good LVF ?right heart results, started on O2, BiPAP, and Revatio but he does not know why?  He notes min cough when supine & in early AM attributed to reflux; min if any sput production, no hemoptysis, he denies SOB but states DOE "if I over-exert" eg- walking, lifting/carrying, stairs, etc; he notes that ADLs are ok- no problem (he is stoic);  He denies CP, palpit, f/c/s, edema... He hasn't been  to an ER since 2007 he says & that was also the last time he had any Pred; he thinks he had CXR, PFT, 2DEcho all earlier this yr...   Smoking Hx>  He is an ex-smoker, started in his teens, smoked for 30 yrs up to 1ppd, quit in 1986; This is a 30 pack-yr hx, he does not recall ever being checked for A1AT defic...  Pulmonary Hx>  COPD/ Emphysema w/ right upper lobe "bleb-ectomy) 2007 in [place]; he has chronic hypoxemic resp failure on O2 at 2L/min since 2007;  He tells me that he was started on BiPAP about that same time but he doesn't know why- never had sleep study, not on CPAP prev, he does not know about pCO2 levels etc ("I like the fresh cool air");  His BiPAP came from [facility] in [place]- states he does not know the settings, machine never downloaded, etc;  He has also been on Revatio20Tid since 2007, apparently never tried on other meds, dose never adjusted- he knows about "pulmonary hypertension" but he doesn't know any details and it doesn't appear to have been followed up, and meds kept the same from doctor to doctor...   Medical Hx>  HBP, ?nonobstructive CAD, HL, thyroid nodule, GERD, constipation, BPH, insomnia...  Family Hx>  Father died w/  Emphysema & was a former smoker; no other hx lung dis in the family; Alpha-1 status is unknown...  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   Current Meds>  Oxygen 2L/min pulse-dose concentrator, Advair250Bid, Spiriva daily, Revatio20Tid, CardizemCD240, Crestor10, Nexium20, Proscar5, Restoril30...  EXAM shows Afeb, VSS, O2sat=93% on 2L/min pulse-dose;  Heent- neg, mallampati1;  Chest- decr BS at bases, can't augment BS voluntarily, w/o w/r/r;  Heart- RR w/o m/r/g;  Abd- soft, neg;  Ext- neg w/o c/c/e;  Neuro- intact...  CXR 10/02/15 showed norm heart size,  COPD, bullous emphysema/ hyperinflation, scarring right apex, NAD...   Spirometry 10/02/15 showed FVC=2.70 (69%), FEV1=1.12 (37%), %1sec=41, mid-flows reduced at 18% predicted; this is c/w severe airflow obstruction & GOLD Stage 3 COPD  Ambulatory oxygen saturation test 10/02/15> on O2 at 2L/min pulse-dose concentrator: O2sat=96% on 2L at rest w/ pulse=87; he walked 2 laps w/ his O2, stopped due to dyspnea, lowest O2sat=89% w/ pulse=113/min...  LAB 10/02/15>  Alpha-1-Antitrypsin level => pending (he never went to the lab for this blood test)  2DEchocardiogram 10/09/15 showed norm LVF w/ EF=55-60%, norm wall motion, mild MR, mild RA dil, PAsys est 93mmHg... Pt on Revatio 20Tid x 59yrs & I rec we wean slowly (Decr to Bid now)...    IMP >>     COPD/ bullous emphysema> s/p RUL "bleb-ectomy" in 2007, severe airflow obstruction w/ GOLD Stage3 COPD> on Advair250Bid & Spiriva daily; apparently he has no use for a rescue inhaler; prev on NEB w/ Albut but not for several yrs.     Chronic hypoxemic respiratory failure on O2 at 2L/min via pulse-dose concentrator...    Pt reports using BiPAP since 2007, never been on CPAP, never had sleep study he says, unknown ABGs or pCO2 data; machine from [facility] we will try to get the old data => none received.    Hx pulmonary hypertension on Revatio20Tid since 2007 w/o additional med trials or dose adjustments> we do not have any of the objective data from his prev physician teams... Current 2DEcho w/ PAsys est 41mmHg & we will wean the Revatio to Bid at this point => he does not want to wean further for "other" reasons...    Medical issues include:  HBP, ?nonobstructive CAD, HL, thyroid nodule, GERD, BPH, insomnia... PLAN >>     Andrew [name] has a distinctly patchy history to go along w/ his severe airflow obstruction & bullous emphysema;  We really need his old objective data from 2007 when he was started on O2 & BiPAP after his RUL bleb reduction surg;  He will try to get  names and numbers for [place]- in the meanwhile we will contact his last physician in [facility] for their more recent data as we establish out data base here in [place];  He is very concerned that he wants [place] to continue his current regimen which has served him well over the last 47yrs;  Continue Advair250Bid, Spiriva daily, Revatio20Tid=>Bid, O2 at 2L/min, and the BiPAP nightly as currently set... We plan ROV recheck in 1 month... NOTE> 2DEcho w/ PAsys est ~8mmHg & we will slowly wean Revatio...  ~  November 01, 2015:  64mo ROV & Andrew [name] reports stable, doing satis & notes no untoward effects from cutting the Revatio to 20mg Bid; he denies CP, palpit, incr SOB, edema,  etc; he continues on the O2 at 2L/min, BiPAP from [place], [place] once daily; we have not received any records from his mult physicians ([place], [place], [place])...    EXAM shows Afeb, VSS, O2sat=92% on 2L/min pulse-dose;  Heent- neg, mallampati1;  Chest- decr BS at bases, can't augment BS voluntarily, w/o w/r/r;  Heart- RR w/o m/r/g;  Abd- soft, neg;  Ext- neg w/o c/c/e;  Neuro- intact... IMP/PLAN>>  See prob list above- he is stable on this regimen but does not want to wean the Revatio further for "other" reasons; OK to continue current med regimen- advised regular exercise vs pulm rehab program; given ZPak for prn use over the winter & he knows to call for any resp issues, incr dyspnea, etc... He remains on BiPAP but we do not have any data- no records received from prev pulm physicians, no notes from [place], no download data from his machine- we will again try to make contact w/ his DME company... We plan ROV in 3-39mo.  ~  Mar 18, 2016:  4-37mo ROV & post hosp visit>  Andrew [name] has severe COPD/Emphysema, GOLD Stage 3 w/ FEV1=1.12L (37%predicted) in [id number],  Hx right thoracotomy & "bleb-ectomy" in 2007;  after this procedure he says he was placed on Oxygen at 2L/min and BiPAP to use at night, along w/ ADVAIR250Bid, SPIRIVA  daily, and REVATIO20Tid for pulmonary HTN (2DEcho here 11/16 showed PAsys est=74mmHg)-- we do not have any of that data from [place] (we have been unsuccessful in obtaining old data from any of his prev physicians); he has been resistent to any adjustment in his medication regimen for various reasons...     He's been [place] x2 since last OV> 1st [place] 2/9 - 01/03/16 by [facility] w/ CAP- CXR showed severe bullous emphysema, incr markings in RLL and atelectasis in R-mid lung, Temp 103, Lactate=2.4, WBC 16K; treated w/ O2, Solumedrol=>Pred, Zosyn/Vanco=>Levaquin, NEBs, etc; disch home w/ home health help- ?seen by his PCP after disch...    2nd [facility] 3/26 - 02/15/16 by [facility] w/ 1d hx incr SOB, wheezing, productive cough & felt to have a COPD exac; CXR showed his COPE/E, persistent incr markings in right base, WBC was elev at 20K, BNP=60, no pos cultures; he was treaed w/ O2, Solumedrol, Roceph/Zithro, NEBs, etc; he was disch on Levaquin & Pred; he developed urinary retention & foley placed (weaned off in NH); he was debilitated & sent to NH for rehab...     He was disch to [facility] for rehab & attended by [facility] note dated 03/01/16 reviewed-- finished Levaquin, weaned off the Pred, they were able to discontinue the foley & he passed voiding trial; disch home after 19d in rehab...     Now back home on same O2= 2L/min days & 3L/min night w/ BiPAP ?settings (he has been on this for yrs and never re-assessed), NEB w/ AlbutTid, Advair250Bid, Spiriva daily, Revatio20Bid (pt refuses to taper this med further);  He has developed pedal edema x3d & needs a low sodium diet + diuretic started but he tells me he is sched to see his PCP soon for this problem...    EXAM shows Afeb, VSS, O2sat=95% on 2L/min pulse-dose;  Heent- neg, mallampati1;  Chest- decr BS at bases, can't augment BS voluntarily, w/o w/r/r;  Heart- RR w/o m/r/g;  Abd- soft, neg;  Ext- neg w/o c/c/e;  Neuro- intact...  CXR 02/11/16 showed  hyperinflation, bullous emphysema, some scarring in right mid lung & both lower  lobes, no infiltrate, no edema...  LABS 01/2016> all reviewed in Epic... IMP/PLAN>>  Andrew [name] has had a protracted resp exac- triggered by prob RLL pneumonia (NOS) superimposed on his severe COPD/bullous emphysema;  Rec to change the Albut for Neb to [place] & continue treatments Tid; continue other meds regularly as outlined;  He is referred to [facility] & hopes to start soon;  We will plan ROV in 75mo sooner if needed.  ~  June 20, 2016:  65mo ROV w/ SN>  Andrew [name] returns for a 69mo ROV & states that he is doing very well- no new complaints or concerns at this time, his PCP is DrKoirala; He started [facility] in June & continues in this program at present; Pt prev inquired about treatments at "[facility]" for regenerative lung tissue (stem cell therapy) which costs ~$10K per treatment & all benefits are antecdotal; I offered to refer him to [facility] [place] [facility] if he wants cutting edge research approach...     EPIC records indicate ER visit 05/08/16 for Abd Pain> VSS, exam was neg, CT Abd showed large fecal burden otherw neg, distended urinary bladder, atherosclerosis, bilat L5 pars defects; Rec to take laxatives...     Severe COPD- GOLD Stage3, bullous emphysema, s/p resection of RUL bleb in 2007> on Advair250Bid & Spiriva daily; he has NEB w/ Duoneb for prn use; Spirometry 09/2015 w/ FEV1=1.12 (37%); he is enrolled in [place] & wants a POC instead of tanks;     Chronic hypoxemic respiratory failure on O2 at 2L/min via POC> ambulatory O2sats 09/2015 dropped to 89% on 2L/min after 2 Laps.    Hx prob hypercarbic resp failure inferred from Pt report of BiPAP ([facility]) since 2007, never been on CPAP, never had sleep study, unknown ABGs or pCO2 data>    Hx cor pulmonale/ pulmonary hypertension on Revatio20Tid since 2007 w/o additional med trials or dose adjustments> we do not have any of the objective data from his prev  physician teams; 2DEcho here 09/2015 showed PAsys=36 and we tried to wean his Revatio but he refused due to "other reasons", finally compromised on Revatio20Bid...    Hx CAP- Hosp 12/2015 w/ RLL opac (nos) & responded to broad spectrum Ab coverage + Sulomedrol, O2, Nebs, etc; readmitted 01/2016 w/ COPD exac- similar Rx but sent to NH for rehab at disch...    Cardiac issues>  ?nonobstructive CAD, cor pulmonale w/ mild pulmHTN & 2DEcho 09/2015 showing norm LVF w/ EF=55-60%, norm wall motion, mild MR, mild RA dil, PAsys est 8mmHg...    Medical issues include:  HBP (on CardizemCD240), HL (on Cres10), hx thyroid nodule, GERD (on Nexium40), Constipation, BPH (on Proscar5 & Flomax0.4) w/ hx urinary retention, insomnia (on Restoril30)... EXAM shows Afeb, VSS, O2sat=93% on 2L/min pulse-dose;  Heent- neg, mallampati1;  Chest- decr BS at bases, can't augment BS voluntarily, w/o w/r/r;  Heart- RR w/o m/r/g;  Abd- soft, neg;  Ext- neg w/o c/c/e;  Neuro- intact...  CXR 05/08/16>  Mod bullous emphysema w/ chr changes at the lung bases w/ pleuroparenchymal scarring, surg suture lines ove the right mid lung...  LABS 04/2016 in epic> Chems- ok, BS=149;  CBC- anemia w/ Hg= 10.4-11.7, WBC=15K IMP/PLAN>>  Andrew [name] is stable on his baseline regimen + pulm rehab, etc; rec to continue current meds and exercise; he needs the 2017 Flu vaccine when avail & will call prn any breathing problems; we plan ROV in 72mo...   ADDENDUM>>  Pt Hosp by [facility] 8/11 -  07/01/16 w/ CAP after presenting w/ incr SOB, decr O2sats at pulm rehab, & chills 9he lives w/ school aged grandkids)- CXR showed ?RLL opac, cultures neg & NOS- treated empirically w/ Rocephin & Zithromax, Solumedrol, O2, NEBS, etc; he improved gradually & disch home;  He went to see ENT 8/29 because he thought the pneumonia may have been from reflux & dysphagia- fiberoptic exam revealed some edema & pooling of secretions c/w LPR=> placed on PPI Bid and referred to GI;  Barium esophagram  showed a [place] & mod GERD but no esophagitis mass or stricture;  He saw DrSchooler & had EGD 08/23/16 showing a [place], patchy candida=> treated w/ Nystatin, later required Diflucan...   ~  November 25, 2016:  58mo ROV w/ SN>  Andrew [name] returns c/o 3d hx cough, lots of clear whitish sput, increased weakness and SOB "even on my oxygen"; symptoms started w/ a slight sore throat, hoarse, cough as noted but no f/c/s; he has been going to the Gym 5d per week to continue his exercise since graduating from the formal pulm rehab program (finished 07/2016) & is doing very well by his estimate (until this URI); he notes that 3d ago he had his usual 2H work out, went to [employer], then went to lunch & did all this w/o difficulty...      Severe COPD- GOLD Stage3, bullous emphysema, s/p resection of RUL bleb in 2007> on Advair250Bid & Spiriva daily; he has NEB w/ Duoneb for prn use; Spirometry 09/2015 w/ FEV1=1.12 (37%); he finished his PulmRehab 07/2016 & now exercises 5d/wk at the gym; he finally got his POC that he wanted...    Chronic hypoxemic respiratory failure on O2 at 2L/min via POC> ambulatory O2sats 09/2015 dropped to 89% on 2L/min after 2 Laps; repeat 11/2016 dropped to 85%after 1Lap...    Hx prob hypercarbic resp failure inferred from Pt report of BiPAP ([facility]) since 2007, never been on CPAP, never had sleep study, unknown ABGs or pCO2 data> we never received records from former physician teams or any of his old records...     Hx cor pulmonale/ pulmonary hypertension on Revatio20Tid since 2007 w/o additional med trials or dose adjustments> we do not have any of the objective data from his prev physician teams; 2DEcho here 09/2015 showed PAsys=36 and we tried to wean his Revatio but he refused due to "other reasons", finally compromised on Revatio20Bid...    Hx CAP- Hosp 12/2015 w/ RLL opac (nos) & responded to broad spectrum Ab coverage + Solumedrol, O2, Nebs, etc; readmitted 01/2016 w/ COPD exac- similar Rx but sent to  NH for rehab at disch...    Cardiac issues>  ?nonobstructive CAD, cor pulmonale w/ mild pulmHTN & 2DEcho 09/2015 showing norm LVF w/ EF=55-60%, norm wall motion, mild MR, mild RA dil, PAsys est 31mmHg...    Medical issues include:  HBP (on CardizemCD240), HL (on Cres10), hx thyroid nodule, GERD & prob LPR (on Nexium40), Constipation, BPH (on Proscar5 & Flomax0.4) w/ hx urinary retention, insomnia (on Restoril30)...       NOTE> pt is Iron deficient (see below) & needs to f/u w/ his PCP & GI for this...  EXAM shows Afeb, VSS, O2sat=92% on 3L/min;  Heent- neg, mallampati1;  Chest- decr BS at bases, can't augment BS voluntarily, w/o w/r/r;  Heart- RR w/o m/r/g;  Abd- soft, neg;  Ext- neg w/o c/c/e;  Neuro- intact...  CXR 06/28/16>  Hyperinflation, marked emphysematous changes, crowed lung markings at the bases, no consolidation or  effusions...  CXR 11/25/16>  COPD, bullous emphysema, and bilat pleuroparenchymal changes c/w scarring, no acute abnormality  Ambulatory O2sat on 3L/min pulse-dose POC>  O2sat=98% on 3L/min pulse-dose at rest w/ HR=90/min;  He ambulated only 1Lap (185') w/ O2sat drop to 85% w/ HR=108/min...  LABS 11/25/16>  Chems- wnl x BS=108;  A1c=6.1;  CBC- Hg=12.3, mcv=90, WBC=7.8K;  Fe=31 (6.6%sat)& Ferritin=8.7;  B12=661 IMP/PLAN>>  There is no such thing as a mild exac for Andrew [name]- even the mildest of URIs can cause a severe COPD exac- we discussed Rx w/ Levaquin500 x7d, Pred20mg - 5d taper (see AVS), + Diflucan100 per his request; hopefully he will respond but he has little in the way of reversible factors given his severe emphysema; reminded to go to the ER for [facility] admission if symptoms worsen despite therapy; as noted he is Fe defic & needs further eval per his PCP & GI- copies sent...    Past Medical History:  Diagnosis Date  . BiPAP (biphasic positive airway pressure) dependence    Pt denies history of OSA  . Cancer ([place])    skin  . Dysphagia   . Emphysema lung ([place])   . GERD  (gastroesophageal reflux disease)    " Silent reflux"  . Hearing loss    right ear  . Hypertension   . Oxygen dependent    2-3 liters  . Pneumonia   . Pulmonary hypertension   Medical Hx>  HBP, ?nonobstructive CAD, HL, thyroid nodule, GERD, constipation, BPH, insomnia... Meds include>  CardizemCD240, Crestor10, Nexium20, Miralax, Proscar10, Restoril30...   Past Surgical History:  Procedure Laterality Date  . CARDIAC CATHETERIZATION    . CATARACT EXTRACTION W/ INTRAOCULAR LENS  IMPLANT, BILATERAL    . ESOPHAGOGASTRODUODENOSCOPY (EGD) WITH PROPOFOL N/A 08/23/2016   Procedure: ESOPHAGOGASTRODUODENOSCOPY (EGD) WITH PROPOFOL;  Surgeon: Wilford Corner, MD;  Location: [facility] ENDOSCOPY;  Service: Endoscopy;  Laterality: N/A;  . LUNG SURGERY    . TONSILLECTOMY    Hx Thoracotomy & RUL Bleb-ectomy 2007 in [place], [place]...   Outpatient Encounter Prescriptions as of 11/25/2016  Medication Sig  . ADVAIR DISKUS 250-50 MCG/DOSE AEPB Inhale 1 puff into the lungs 2 (two) times daily.  Marland Kitchen albuterol (PROVENTIL HFA;VENTOLIN HFA) 108 (90 Base) MCG/ACT inhaler Inhale 2 puffs into the lungs every 6 (six) hours as needed for wheezing or shortness of breath.  . diltiazem (CARTIA XT) 240 MG 24 hr capsule Take 240 mg by mouth at bedtime.  Marland Kitchen esomeprazole (NEXIUM) 20 MG capsule Take 20 mg by mouth 2 (two) times daily before a meal.   . finasteride (PROSCAR) 5 MG tablet Take 5 mg by mouth at bedtime.   Marland Kitchen ipratropium-albuterol (DUONEB) 0.5-2.5 (3) MG/3ML SOLN Take 3 mLs by nebulization every 6 (six) hours as needed. Dx: J44.9 (Patient taking differently: Take 3 mLs by nebulization every 6 (six) hours as needed (shortness of breath/ wheezing). Dx: J44.9)  . liver oil-zinc oxide (DESITIN) 40 % ointment Apply 1 application topically See admin instructions. Apply to left butt cheek for wound care approximately every 4 hours while awake  . Multiple Vitamins-Minerals (MULTIVITAMIN WITH MINERALS) tablet Take 1 tablet by mouth  daily.  Marland Kitchen OVER THE COUNTER MEDICATION Apply 1 application topically See admin instructions. Protective ointment from home health care - apply approximately every 4 hours to left butt cheek for wound care  . OXYGEN Inhale 2-3 L into the lungs continuous. 2L daytime, 3L at night  . polyethylene glycol (MIRALAX / GLYCOLAX) packet Take 17 g by mouth daily. (  Patient taking differently: Take 17 g by mouth at bedtime. Mix in 8 oz liquid and drink)  . rosuvastatin (CRESTOR) 10 MG tablet Take 10 mg by mouth daily.  . sildenafil (REVATIO) 20 MG tablet Take 1 tablet (20 mg total) by mouth 2 (two) times daily.  Marland Kitchen SPIRIVA HANDIHALER 18 MCG inhalation capsule INHALE THE CONTENTS OF 1 CAPSULE EVERY DAY (Patient taking differently: INHALE THE CONTENTS OF 1 CAPSULE EVERY DAY AT NOON)  . tamsulosin (FLOMAX) 0.4 MG CAPS capsule Take 1 capsule (0.4 mg total) by mouth daily after breakfast.  . temazepam (RESTORIL) 30 MG capsule Take 30 mg by mouth at bedtime.  . vitamin C (ASCORBIC ACID) 500 MG tablet Take 500 mg by mouth daily.  Marland Kitchen VITAMIN E PO Take 1 capsule by mouth daily.  . fluconazole (DIFLUCAN) 100 MG tablet Take 1 tablet (100 mg total) by mouth daily.  Marland Kitchen levofloxacin (LEVAQUIN) 500 MG tablet Take 1 tablet (500 mg total) by mouth daily.  . predniSONE (DELTASONE) 20 MG tablet Take as directed  . [EXPIRED] methylPREDNISolone acetate (DEPO-MEDROL) injection 80 mg    No facility-administered encounter medications on file as of 11/25/2016.     No Known Allergies   Current Medications, Allergies, Past Medical History, Past Surgical History, Family History, and Social History were reviewed in [employer] record.   Review of Systems             All symptoms NEG except where BOLDED >>  Constitutional:  F/C/S, fatigue, anorexia, unexpected weight change. HEENT:  HA, visual changes, hearing loss, earache, nasal symptoms, sore throat, mouth sores, hoarseness. Resp:  cough, sputum, hemoptysis;  SOB, tightness, wheezing. Cardio:  CP, palpit, DOE, orthopnea, edema. GI:  N/V/D/C, blood in stool; reflux, abd pain, distention, gas. GU:  dysuria, freq, urgency, hematuria, flank pain, voiding difficulty. MS:  joint pain, swelling, tenderness, decr ROM; neck pain, back pain, etc. Neuro:  HA, tremors, seizures, dizziness, syncope, weakness, numbness, gait abn. Skin:  suspicious lesions or skin rash. Heme:  adenopathy, bruising, bleeding. Psyche:  confusion, agitation, sleep disturbance, hallucinations, anxiety, depression suicidal.   Objective:   Physical Exam       Vital Signs:  Reviewed...   General:  WD, WN, 76 y/o WM in NAD; alert & oriented; pleasant & cooperative... HEENT:  [place]/AT; Conjunctiva- pink, Sclera- nonicteric, EOM-wnl, PERRLA, EACs-clear, TMs-wnl; NOSE-clear; THROAT-clear & wnl.  Neck:  Supple w/ fair ROM; no JVD; normal carotid impulses w/o bruits; no thyromegaly +small nodule palpated; no lymphadenopathy.  Chest:  Overinflated, resonant percussion note, decr BS bilat & can't augment BS voluntarily, no w/r/r heard... Heart:  Regular Rhythm; norm S1 & S2 without murmurs, rubs, or gallops detected. Abdomen:  Soft & nontender- no guarding or rebound; normal bowel sounds; no organomegaly or masses palpated. Ext:  decr ROM; without deformities or arthritic changes; no varicose veins, +venous insuffic, no edema;  Pulses intact w/o bruits. Neuro:  No focal neuro deficits; sensory testing normal; gait normal & balance OK. Derm:  No lesions noted; no rash etc. Lymph:  No cervical, supraclavicular, axillary, or inguinal adenopathy palpated.   Assessment:      IMP >>     Severe COPD- GOLD Stage3, bullous emphysema, s/p resection of RUL bleb in 2007> on Advair250Bid & Spiriva daily; he has NEB w/ Duoneb for prn use; Spirometry 09/2015 w/ FEV1=1.12 (37%); he has completed pulm rehab & now exercises at the gym 5d/wk;  He's had mult COPD exac => treat w/  Levaquin, Pred taper,  continue Advair/ spiriva/ NEBS/ O2...    Chronic hypoxemic respiratory failure on O2 at 2L/min via POC> ambulatory O2sats 09/2015 dropped to 89% on 2L/min after 2 Laps; he dropped to 85% after 1Lap on 3L/min POC 11/2016...    Hx prob hypercarbic resp failure inferred from Pt report of BiPAP ([facility]) since 2007, never been on CPAP, never had sleep study, unknown ABGs or pCO2 data> we never received data from his prev physicians.    Hx cor pulmonale/ pulmonary hypertension on Revatio20Tid since 2007 w/o additional med trials or dose adjustments> we do not have any of the objective data from his prev physician teams; 2DEcho here 09/2015 showed PAsys=36 and we tried to wean his Revatio but he refused due to "other reasons", finally compromised on Revatio20Bid...    Hx CAP- Hosp 12/2015 w/ RLL opac (nos) & responded to broad spectrum Ab coverage + Sulomedrol, O2, Nebs, etc; readmitted 01/2016 w/ COPD exac- similar Rx but sent to NH for rehab at disch...    Cardiac issues>  ?nonobstructive CAD, cor pulmonale w/ mild pulmHTN & 2DEcho 09/2015 showing norm LVF w/ EF=55-60%, norm wall motion, mild MR, mild RA dil, PAsys est 7mmHg...    Medical issues include:  HBP (on CardizemCD240), HL (on Cres10), hx thyroid nodule, GERD (on Nexium40), constipation, BPH (on Proscar5 & Flomax0.4) w/ hx urinary retention, insomnia (on Restoril30)...       NOTE>  He is Iron deficient & need further eval & Rx from his PCP & GI...  PLAN >>  10/02/15>   Algirdas has a distinctly patchy history to go along w/ his severe airflow obstruction & bullous emphysema;  We really need his old objective data from 2007 when he was started on O2 & BiPAP after his RUL bleb reduction surg;  He will try to get names and numbers for [place]- in the meanwhile we will contact his last physician in [facility] for their more recent data as we establish out data base here in [place];  He is very concerned that he wants [place] to continue his current regimen which  has served him well over the last 51yrs;  Continue Advair250Bid, Spiriva daily, Revatio20tid, O2 at 2L/min, and the BiPAP nightly as currently set...  11/01/15>  See prob list above- he is stable on this regimen but does not want to wean the Revatio further for "other" reasons; OK to continue current med regimen- advised regular exercise vs pulm rehab program; given ZPak for prn use over the winter & he knows to call for any resp issues, incr dyspnea, etc... He remains on BiPAP but we do not have any data- no records received from prev pulm physicians, no notes from [place], no download data from his machine- we will again try to make contact w/ his DME company... 03/18/16>   Andrew [name] has had a protracted resp [place] x2 this spring- triggered by prob RLL pneumonia (NOS) superimposed on his severe COPD/bullous emphysema;  Rec to change the [place] for Neb to [place] & continue treatments Tid; continue other meds regularly as outlined;  He is referred to [facility] & hopes to start soon;  We will plan ROV in 35mo sooner if needed. 06/20/16>   Andrew [name] is stable on his baseline regimen + pulm rehab, etc; rec to continue current meds and exercise; he needs the 2017 Flu vaccine when avail & will call prn any breathing problems; we plan ROV in 6mo...  11/25/16>   There is  no such thing as a mild exac for Andrew [name]- even the mildest of URIs can cause a severe COPD exac- we discussed Rx w/ Levaquin500 x7d, Pred20mg - 5d taper (see AVS), + Diflucan100 per his request; hopefully he will respond but he has little in the way of reversible factors given his severe emphysema; reminded to go to the ER for [facility] admission if symptoms worsen despite therapy; as noted he is Fe defic & needs further eval per his PCP & GI- copies sent.     Plan:     Patient's Medications  New Prescriptions   FLUCONAZOLE (DIFLUCAN) 100 MG TABLET    Take 1 tablet (100 mg total) by mouth daily.   LEVOFLOXACIN (LEVAQUIN) 500 MG TABLET    Take 1 tablet (500 mg  total) by mouth daily.   PREDNISONE (DELTASONE) 20 MG TABLET    Take as directed  Previous Medications   ADVAIR DISKUS 250-50 MCG/DOSE AEPB    Inhale 1 puff into the lungs 2 (two) times daily.   ALBUTEROL (PROVENTIL HFA;VENTOLIN HFA) 108 (90 BASE) MCG/ACT INHALER    Inhale 2 puffs into the lungs every 6 (six) hours as needed for wheezing or shortness of breath.   DILTIAZEM (CARTIA XT) 240 MG 24 HR CAPSULE    Take 240 mg by mouth at bedtime.   ESOMEPRAZOLE (NEXIUM) 20 MG CAPSULE    Take 20 mg by mouth 2 (two) times daily before a meal.    FINASTERIDE (PROSCAR) 5 MG TABLET    Take 5 mg by mouth at bedtime.    IPRATROPIUM-ALBUTEROL (DUONEB) 0.5-2.5 (3) MG/3ML SOLN    Take 3 mLs by nebulization every 6 (six) hours as needed. Dx: J44.9   LIVER OIL-ZINC OXIDE (DESITIN) 40 % OINTMENT    Apply 1 application topically See admin instructions. Apply to left butt cheek for wound care approximately every 4 hours while awake   MULTIPLE VITAMINS-MINERALS (MULTIVITAMIN WITH MINERALS) TABLET    Take 1 tablet by mouth daily.   OVER THE COUNTER MEDICATION    Apply 1 application topically See admin instructions. Protective ointment from home health care - apply approximately every 4 hours to left butt cheek for wound care   OXYGEN    Inhale 2-3 L into the lungs continuous. 2L daytime, 3L at night   POLYETHYLENE GLYCOL (MIRALAX / GLYCOLAX) PACKET    Take 17 g by mouth daily.   ROSUVASTATIN (CRESTOR) 10 MG TABLET    Take 10 mg by mouth daily.   SILDENAFIL (REVATIO) 20 MG TABLET    Take 1 tablet (20 mg total) by mouth 2 (two) times daily.   SPIRIVA HANDIHALER 18 MCG INHALATION CAPSULE    INHALE THE CONTENTS OF 1 CAPSULE EVERY DAY   TAMSULOSIN (FLOMAX) 0.4 MG CAPS CAPSULE    Take 1 capsule (0.4 mg total) by mouth daily after breakfast.   TEMAZEPAM (RESTORIL) 30 MG CAPSULE    Take 30 mg by mouth at bedtime.   VITAMIN C (ASCORBIC ACID) 500 MG TABLET    Take 500 mg by mouth daily.   VITAMIN E PO    Take 1 capsule by mouth  daily.  Modified Medications   No medications on file  Discontinued Medications   No medications on file

## 2016-12-07 ENCOUNTER — Inpatient Hospital Stay (HOSPITAL_COMMUNITY)
Admission: EM | Admit: 2016-12-07 | Discharge: 2016-12-11 | DRG: 190 | Disposition: A | Discharge status: Home / Self Care | Payer: Medicare Other | Attending: Internal Medicine | Admitting: Internal Medicine

## 2016-12-07 ENCOUNTER — Emergency Department (HOSPITAL_COMMUNITY): Payer: Medicare Other

## 2016-12-07 ENCOUNTER — Encounter (HOSPITAL_COMMUNITY): Payer: Self-pay

## 2016-12-07 DIAGNOSIS — J9621 Acute and chronic respiratory failure with hypoxia: Secondary | ICD-10-CM | POA: Diagnosis not present

## 2016-12-07 DIAGNOSIS — H9191 Unspecified hearing loss, right ear: Secondary | ICD-10-CM | POA: Diagnosis present

## 2016-12-07 DIAGNOSIS — Z961 Presence of intraocular lens: Secondary | ICD-10-CM | POA: Diagnosis present

## 2016-12-07 DIAGNOSIS — D72829 Elevated white blood cell count, unspecified: Secondary | ICD-10-CM | POA: Diagnosis present

## 2016-12-07 DIAGNOSIS — G4733 Obstructive sleep apnea (adult) (pediatric): Secondary | ICD-10-CM | POA: Diagnosis present

## 2016-12-07 DIAGNOSIS — Z9841 Cataract extraction status, right eye: Secondary | ICD-10-CM

## 2016-12-07 DIAGNOSIS — Z87891 Personal history of nicotine dependence: Secondary | ICD-10-CM

## 2016-12-07 DIAGNOSIS — N400 Enlarged prostate without lower urinary tract symptoms: Secondary | ICD-10-CM | POA: Diagnosis present

## 2016-12-07 DIAGNOSIS — Z8679 Personal history of other diseases of the circulatory system: Secondary | ICD-10-CM

## 2016-12-07 DIAGNOSIS — I10 Essential (primary) hypertension: Secondary | ICD-10-CM | POA: Diagnosis not present

## 2016-12-07 DIAGNOSIS — E785 Hyperlipidemia, unspecified: Secondary | ICD-10-CM | POA: Diagnosis present

## 2016-12-07 DIAGNOSIS — J9622 Acute and chronic respiratory failure with hypercapnia: Secondary | ICD-10-CM | POA: Diagnosis present

## 2016-12-07 DIAGNOSIS — Z6821 Body mass index (BMI) 21.0-21.9, adult: Secondary | ICD-10-CM

## 2016-12-07 DIAGNOSIS — Z7952 Long term (current) use of systemic steroids: Secondary | ICD-10-CM | POA: Diagnosis not present

## 2016-12-07 DIAGNOSIS — Z808 Family history of malignant neoplasm of other organs or systems: Secondary | ICD-10-CM

## 2016-12-07 DIAGNOSIS — J441 Chronic obstructive pulmonary disease with (acute) exacerbation: Principal | ICD-10-CM | POA: Diagnosis present

## 2016-12-07 DIAGNOSIS — I272 Pulmonary hypertension, unspecified: Secondary | ICD-10-CM | POA: Diagnosis present

## 2016-12-07 DIAGNOSIS — R0902 Hypoxemia: Secondary | ICD-10-CM | POA: Diagnosis not present

## 2016-12-07 DIAGNOSIS — E43 Unspecified severe protein-calorie malnutrition: Secondary | ICD-10-CM | POA: Diagnosis not present

## 2016-12-07 DIAGNOSIS — R030 Elevated blood-pressure reading, without diagnosis of hypertension: Secondary | ICD-10-CM | POA: Diagnosis not present

## 2016-12-07 DIAGNOSIS — T380X5A Adverse effect of glucocorticoids and synthetic analogues, initial encounter: Secondary | ICD-10-CM | POA: Diagnosis present

## 2016-12-07 DIAGNOSIS — Z9842 Cataract extraction status, left eye: Secondary | ICD-10-CM

## 2016-12-07 DIAGNOSIS — Z9981 Dependence on supplemental oxygen: Secondary | ICD-10-CM | POA: Diagnosis not present

## 2016-12-07 DIAGNOSIS — R0602 Shortness of breath: Secondary | ICD-10-CM | POA: Diagnosis not present

## 2016-12-07 DIAGNOSIS — K219 Gastro-esophageal reflux disease without esophagitis: Secondary | ICD-10-CM | POA: Diagnosis present

## 2016-12-07 DIAGNOSIS — Z789 Other specified health status: Secondary | ICD-10-CM

## 2016-12-07 DIAGNOSIS — Z539 Procedure and treatment not carried out, unspecified reason: Secondary | ICD-10-CM | POA: Diagnosis not present

## 2016-12-07 HISTORY — DX: Chronic obstructive pulmonary disease, unspecified: J44.9

## 2016-12-07 HISTORY — DX: Hyperlipidemia, unspecified: E78.5

## 2016-12-07 HISTORY — DX: Benign prostatic hyperplasia without lower urinary tract symptoms: N40.0

## 2016-12-07 LAB — BLOOD GAS, ARTERIAL
ACID-BASE EXCESS: 2.4 mmol/L — AB (ref 0.0–2.0)
BICARBONATE: 26.1 mmol/L (ref 20.0–28.0)
Drawn by: 441261
O2 CONTENT: 4 L/min
O2 SAT: 98.5 %
PATIENT TEMPERATURE: 98.6
PCO2 ART: 38.8 mmHg (ref 32.0–48.0)
PO2 ART: 146 mmHg — AB (ref 83.0–108.0)
pH, Arterial: 7.442 (ref 7.350–7.450)

## 2016-12-07 LAB — COMPREHENSIVE METABOLIC PANEL
ALT: 24 U/L (ref 17–63)
AST: 15 U/L (ref 15–41)
Albumin: 4.1 g/dL (ref 3.5–5.0)
Alkaline Phosphatase: 72 U/L (ref 38–126)
Anion gap: 7 (ref 5–15)
BUN: 21 mg/dL — AB (ref 6–20)
CHLORIDE: 103 mmol/L (ref 101–111)
CO2: 28 mmol/L (ref 22–32)
CREATININE: 0.78 mg/dL (ref 0.61–1.24)
Calcium: 8.7 mg/dL — ABNORMAL LOW (ref 8.9–10.3)
GFR calc Af Amer: >60 mL/min (ref >60)
GFR calc non Af Amer: >60 mL/min (ref >60)
Glucose, Bld: 85 mg/dL (ref 65–99)
POTASSIUM: 4.5 mmol/L (ref 3.5–5.1)
SODIUM: 138 mmol/L (ref 135–145)
Total Bilirubin: 0.5 mg/dL (ref 0.3–1.2)
Total Protein: 6.6 g/dL (ref 6.5–8.1)

## 2016-12-07 LAB — CBC WITH DIFFERENTIAL/PLATELET
Basophils Absolute: 0 10*3/uL (ref 0.0–0.1)
Basophils Relative: 0 %
EOS ABS: 0 10*3/uL (ref 0.0–0.7)
Eosinophils Relative: 0 %
HCT: 37.7 % — ABNORMAL LOW (ref 39.0–52.0)
HEMOGLOBIN: 12.4 g/dL — AB (ref 13.0–17.0)
LYMPHS ABS: 0.5 10*3/uL — AB (ref 0.7–4.0)
LYMPHS PCT: 3 %
MCH: 29.3 pg (ref 26.0–34.0)
MCHC: 32.9 g/dL (ref 30.0–36.0)
MCV: 89.1 fL (ref 78.0–100.0)
MONOS PCT: 6 %
Monocytes Absolute: 1 10*3/uL (ref 0.1–1.0)
Neutro Abs: 16.1 10*3/uL — ABNORMAL HIGH (ref 1.7–7.7)
Neutrophils Relative %: 91 %
Platelets: 269 10*3/uL (ref 150–400)
RBC: 4.23 MIL/uL (ref 4.22–5.81)
RDW: 15.5 % (ref 11.5–15.5)
WBC: 17.6 10*3/uL — AB (ref 4.0–10.5)

## 2016-12-07 LAB — TROPONIN I: Troponin I: <0.03 ng/mL (ref <0.03)

## 2016-12-07 LAB — URINALYSIS, ROUTINE W REFLEX MICROSCOPIC
BILIRUBIN URINE: NEGATIVE
Glucose, UA: >=500 mg/dL — AB
HGB URINE DIPSTICK: NEGATIVE
KETONES UR: NEGATIVE mg/dL
LEUKOCYTES UA: NEGATIVE
NITRITE: NEGATIVE
PROTEIN: NEGATIVE mg/dL
Specific Gravity, Urine: 1.013 (ref 1.005–1.030)
pH: 7 (ref 5.0–8.0)

## 2016-12-07 LAB — BRAIN NATRIURETIC PEPTIDE: B Natriuretic Peptide: 26.5 pg/mL (ref 0.0–100.0)

## 2016-12-07 LAB — I-STAT CG4 LACTIC ACID, ED
Lactic Acid, Venous: 1.28 mmol/L (ref 0.5–1.9)
Lactic Acid, Venous: 1.65 mmol/L (ref 0.5–1.9)

## 2016-12-07 MED ORDER — TAMSULOSIN HCL 0.4 MG PO CAPS
0.4000 mg | ORAL_CAPSULE | Freq: Every day | ORAL | Status: DC
  Administered 2016-12-08 – 2016-12-11 (×4): 0.4 mg via ORAL
  Filled 2016-12-07 (×4): qty 1

## 2016-12-07 MED ORDER — ALBUTEROL (5 MG/ML) CONTINUOUS INHALATION SOLN
10.0000 mg/h | INHALATION_SOLUTION | Freq: Once | RESPIRATORY_TRACT | Status: AC
  Administered 2016-12-07: 10 mg/h via RESPIRATORY_TRACT
  Filled 2016-12-07: qty 20

## 2016-12-07 MED ORDER — PREDNISONE 20 MG PO TABS
40.0000 mg | ORAL_TABLET | Freq: Every day | ORAL | Status: DC
  Administered 2016-12-08: 40 mg via ORAL
  Filled 2016-12-07: qty 2

## 2016-12-07 MED ORDER — ROSUVASTATIN CALCIUM 10 MG PO TABS
10.0000 mg | ORAL_TABLET | Freq: Every day | ORAL | Status: DC
  Administered 2016-12-07 – 2016-12-11 (×5): 10 mg via ORAL
  Filled 2016-12-07 (×5): qty 1

## 2016-12-07 MED ORDER — ACETAMINOPHEN 650 MG RE SUPP: 650.0000 mg | Freq: Four times a day (QID) | RECTAL | Status: DC | PRN

## 2016-12-07 MED ORDER — ONDANSETRON HCL 4 MG PO TABS: 4.0000 mg | ORAL_TABLET | Freq: Four times a day (QID) | ORAL | Status: DC | PRN

## 2016-12-07 MED ORDER — BENZONATATE 100 MG PO CAPS: 100.0000 mg | ORAL_CAPSULE | Freq: Three times a day (TID) | ORAL | Status: DC | PRN

## 2016-12-07 MED ORDER — TEMAZEPAM 15 MG PO CAPS
30.0000 mg | ORAL_CAPSULE | Freq: Every day | ORAL | Status: DC
Start: 2016-12-07 — End: 2016-12-11
  Administered 2016-12-07 – 2016-12-10 (×4): 30 mg via ORAL
  Filled 2016-12-07 (×5): qty 2

## 2016-12-07 MED ORDER — ENOXAPARIN SODIUM 40 MG/0.4ML ~~LOC~~ SOLN
40.0000 mg | Freq: Every day | SUBCUTANEOUS | Status: DC
  Administered 2016-12-07 – 2016-12-10 (×4): 40 mg via SUBCUTANEOUS
  Filled 2016-12-07 (×4): qty 0.4

## 2016-12-07 MED ORDER — PANTOPRAZOLE SODIUM 40 MG PO TBEC
40.0000 mg | DELAYED_RELEASE_TABLET | Freq: Every day | ORAL | Status: DC
  Administered 2016-12-07 – 2016-12-08 (×2): 40 mg via ORAL
  Filled 2016-12-07 (×2): qty 1

## 2016-12-07 MED ORDER — METHYLPREDNISOLONE SODIUM SUCC 125 MG IJ SOLR
125.0000 mg | Freq: Once | INTRAMUSCULAR | Status: DC
  Filled 2016-12-07: qty 2

## 2016-12-07 MED ORDER — MOMETASONE FURO-FORMOTEROL FUM 200-5 MCG/ACT IN AERO
2.0000 | INHALATION_SPRAY | Freq: Two times a day (BID) | RESPIRATORY_TRACT | Status: DC
  Administered 2016-12-07 – 2016-12-11 (×8): 2 via RESPIRATORY_TRACT
  Filled 2016-12-07: qty 8.8

## 2016-12-07 MED ORDER — SODIUM CHLORIDE 0.9 % IV SOLN
INTRAVENOUS | Status: AC
  Administered 2016-12-07: 22:00:00 via INTRAVENOUS

## 2016-12-07 MED ORDER — ACETAMINOPHEN 325 MG PO TABS: 650.0000 mg | ORAL_TABLET | Freq: Four times a day (QID) | ORAL | Status: DC | PRN

## 2016-12-07 MED ORDER — POLYETHYLENE GLYCOL 3350 17 G PO PACK
17.0000 g | PACK | Freq: Every day | ORAL | Status: DC
  Administered 2016-12-07 – 2016-12-10 (×4): 17 g via ORAL
  Filled 2016-12-07 (×4): qty 1

## 2016-12-07 MED ORDER — FINASTERIDE 5 MG PO TABS
5.0000 mg | ORAL_TABLET | Freq: Every day | ORAL | Status: DC
  Administered 2016-12-07 – 2016-12-10 (×4): 5 mg via ORAL
  Filled 2016-12-07 (×4): qty 1

## 2016-12-07 MED ORDER — ONDANSETRON HCL 4 MG/2ML IJ SOLN: 4.0000 mg | Freq: Four times a day (QID) | INTRAMUSCULAR | Status: DC | PRN

## 2016-12-07 MED ORDER — SILDENAFIL CITRATE 20 MG PO TABS
20.0000 mg | ORAL_TABLET | Freq: Two times a day (BID) | ORAL | Status: DC
  Administered 2016-12-07 – 2016-12-11 (×8): 20 mg via ORAL
  Filled 2016-12-07 (×8): qty 1

## 2016-12-07 MED ORDER — DILTIAZEM HCL ER COATED BEADS 240 MG PO CP24
240.0000 mg | ORAL_CAPSULE | Freq: Every day | ORAL | Status: DC
  Administered 2016-12-07 – 2016-12-10 (×4): 240 mg via ORAL
  Filled 2016-12-07 (×4): qty 1

## 2016-12-07 MED ORDER — IPRATROPIUM-ALBUTEROL 0.5-2.5 (3) MG/3ML IN SOLN
3.0000 mL | Freq: Four times a day (QID) | RESPIRATORY_TRACT | Status: DC
  Administered 2016-12-07 – 2016-12-08 (×2): 3 mL via RESPIRATORY_TRACT
  Filled 2016-12-07: qty 3

## 2016-12-07 MED ORDER — IPRATROPIUM-ALBUTEROL 0.5-2.5 (3) MG/3ML IN SOLN
3.0000 mL | RESPIRATORY_TRACT | Status: DC | PRN
  Administered 2016-12-09 (×2): 3 mL via RESPIRATORY_TRACT
  Filled 2016-12-07 (×2): qty 3

## 2016-12-07 NOTE — H&P (Signed)
History and Physical    Andrew [name] [id number] DOB: June 16, 1941 DOA: 12/07/2016  PCP: Lujean Amel, MD  Pulmonary: Teressa Lower  Patient coming from: Home  Chief Complaint: Shortness of breath  HPI: Andrew [name] is a 76 y.o. gentleman with a history of severe COPD, chronic respiratory failure with hypoxia and hypercarbia, O2 dependence 2L [place] at baseline, BiPAP dependence qHS, pulmonary HTN, and essential HTN who feels that he was in his baseline state of health until approximately two weeks ago.  He reports having had a root canal performed then developed cough and sore throat after this prolonged dental procedure.  Cough has been nonproductive.  He had mild wheezing.  He saw his pulmonologist on 1/8 and was prescribed a seven day course of Levaquin (which he completed), diflucan, and a prednisone taper (he has two doses left).  He initially felt improvement, but has subsequently developed worsening shortness of breath at rest and on exertion with decreasing O2 saturations on 2L [place].  He says that he now gets short of breath just standing up.  He explicitly denies chest pain, pressure, or tightness.  No light-headedness or near syncope.  No LOC.  No nausea or vomiting.  No orthopnea.  No swelling.  No fever.  No known sick contacts.  He gets his influenza vaccine every year.  He was last hospitalized for his COPD in August.  ED Course: Patient was 90% on 2L [place].  He reports O2 saturations of 98% at home when he is well.  He was put on 8L [place] in the ED, with O2 sats of 100% upon my arrival to the bedside.  He has received solumedrol 125mg  IV one time and a one-hour long albuterol nebulizer treatment.  O2 turned down to 7L.  Chest xray negative for acute process.    Review of Systems: As per HPI otherwise 10 point review of systems negative.    Past Medical History:  Diagnosis Date  . BiPAP (biphasic positive airway pressure) dependence    Pt denies history of OSA  . Cancer ([place])    skin    . Dysphagia   . Emphysema lung ([place])   . GERD (gastroesophageal reflux disease)    " Silent reflux"  . Hearing loss    right ear  . Hypertension   . Oxygen dependent    2-3 liters  . Oxygen dependent   . Pneumonia   . Pulmonary hypertension     Past Surgical History:  Procedure Laterality Date  . CARDIAC CATHETERIZATION    . CATARACT EXTRACTION W/ INTRAOCULAR LENS  IMPLANT, BILATERAL    . ESOPHAGOGASTRODUODENOSCOPY (EGD) WITH PROPOFOL N/A 08/23/2016   Procedure: ESOPHAGOGASTRODUODENOSCOPY (EGD) WITH PROPOFOL;  Surgeon: Wilford Corner, MD;  Location[facility] ENDOSCOPY;  Service: Endoscopy;  Laterality: N/A;  . LUNG SURGERY    . TONSILLECTOMY       reports that he quit smoking about 32 years ago. His smoking use included Cigarettes. He smoked 0.00 packs per day for 0.00 years. He has never used smokeless tobacco. He reports that he does not drink alcohol or use drugs.  He lives with his daughter, who is his next of kin.  No Known Allergies  Family History  Problem Relation Age of Onset  . Bone cancer Maternal Uncle   Father was a smoker and died of complications related to emphysema.   Prior to Admission medications   Medication Sig Start Date End Date Taking? Authorizing Provider  ADVAIR DISKUS 250-50 MCG/DOSE AEPB Inhale 1 puff  into the lungs 2 (two) times daily. 11/01/15   Noralee Space, MD  albuterol (PROVENTIL HFA;VENTOLIN HFA) 108 (90 Base) MCG/ACT inhaler Inhale 2 puffs into the lungs every 6 (six) hours as needed for wheezing or shortness of breath. 01/03/16   Nita Sells, MD  diltiazem (CARTIA XT) 240 MG 24 hr capsule Take 240 mg by mouth at bedtime.    Historical Provider, MD  esomeprazole (NEXIUM) 20 MG capsule Take 20 mg by mouth 2 (two) times daily before a meal.     Historical Provider, MD  finasteride (PROSCAR) 5 MG tablet Take 5 mg by mouth at bedtime.     Historical Provider, MD  fluconazole (DIFLUCAN) 100 MG tablet Take 1 tablet (100 mg total) by mouth  daily. 11/25/16   Noralee Space, MD  ipratropium-albuterol (DUONEB) 0.5-2.5 (3) MG/3ML SOLN Take 3 mLs by nebulization every 6 (six) hours as needed. Dx: J44.9 Patient taking differently: Take 3 mLs by nebulization every 6 (six) hours as needed (shortness of breath/ wheezing). Dx: [id number] 03/18/16   Noralee Space, MD  levofloxacin (LEVAQUIN) 500 MG tablet Take 1 tablet (500 mg total) by mouth daily. 11/25/16   Noralee Space, MD  liver oil-zinc oxide (DESITIN) 40 % ointment Apply 1 application topically See admin instructions. Apply to left butt cheek for wound care approximately every 4 hours while awake    Historical Provider, MD  Multiple Vitamins-Minerals (MULTIVITAMIN WITH MINERALS) tablet Take 1 tablet by mouth daily.    Historical Provider, MD  OVER THE COUNTER MEDICATION Apply 1 application topically See admin instructions. Protective ointment from home health care - apply approximately every 4 hours to left butt cheek for wound care    Historical Provider, MD  OXYGEN Inhale 2-3 L into the lungs continuous. 2L daytime, 3L at night    Historical Provider, MD  polyethylene glycol (MIRALAX / GLYCOLAX) packet Take 17 g by mouth daily. Patient taking differently: Take 17 g by mouth at bedtime. Mix in 8 oz liquid and drink 05/08/16   Merrily Pew, MD  predniSONE (DELTASONE) 20 MG tablet Take as directed 11/25/16   Noralee Space, MD  rosuvastatin (CRESTOR) 10 MG tablet Take 10 mg by mouth daily.    Historical Provider, MD  sildenafil (REVATIO) 20 MG tablet Take 1 tablet (20 mg total) by mouth 2 (two) times daily. 10/23/16   Noralee Space, MD  SPIRIVA HANDIHALER 18 MCG inhalation capsule INHALE THE CONTENTS OF 1 CAPSULE EVERY DAY Patient taking differently: INHALE THE CONTENTS OF 1 CAPSULE EVERY DAY AT [facility] 04/23/16   Noralee Space, MD  tamsulosin (FLOMAX) 0.4 MG CAPS capsule Take 1 capsule (0.4 mg total) by mouth daily after breakfast. 01/03/16   Nita Sells, MD  temazepam (RESTORIL) 30 MG capsule Take 30  mg by mouth at bedtime.    Historical Provider, MD  vitamin C (ASCORBIC ACID) 500 MG tablet Take 500 mg by mouth daily.    Historical Provider, MD  VITAMIN E PO Take 1 capsule by mouth daily.    Historical Provider, MD    Physical Exam: Vitals:   12/07/16 1800 12/07/16 1817 12/07/16 1830 12/07/16 1900  BP: 135/65  133/63 131/79  Pulse: 79 98 80 84  Resp: 18 18 16 13   Temp:      TempSrc:      SpO2: 100% 100% 100% 100%      Constitutional: NAD, calm, chronically ill appearing but not decompensating Vitals:   12/07/16 1800 12/07/16  1817 12/07/16 1830 12/07/16 1900  BP: 135/65  133/63 131/79  Pulse: 79 98 80 84  Resp: 18 18 16 13   Temp:      TempSrc:      SpO2: 100% 100% 100% 100%   Eyes: pupils appear equal, lids and conjunctivae normal ENMT: Mucous membranes are moist. Normal dentition.  Neck: normal appearance, supple Respiratory: Decreased air movement bilaterally; no wheezing, no crackles. Normal respiratory effort. No accessory muscle use.  Cardiovascular: Normal rate, regular rhythm, no murmurs / rubs / gallops. No extremity edema. 2+ pedal pulses.  GI: abdomen is soft and compressible.  No distention.  No tenderness.  Bowel sounds are present. Musculoskeletal:  No joint deformity in upper and lower extremities. Good ROM, no contractures. Normal muscle tone.  Skin: no rashes, warm and dry Neurologic: No focal deficits. Sensation intact, Strength symmetric bilaterally, 5/5. Psychiatric: Normal judgment and insight. Alert and oriented x 3. Normal mood.     Labs on Admission: I have personally reviewed following labs and imaging studies  CBC:  Recent Labs Lab 12/07/16 1718  WBC 17.6*  NEUTROABS 16.1*  HGB 12.4*  HCT 37.7*  MCV 89.1  PLT [id number]   Basic Metabolic Panel:  Recent Labs Lab 12/07/16 1718  NA 138  K 4.5  CL 103  CO2 28  GLUCOSE 85  BUN 21*  CREATININE 0.78  CALCIUM 8.7*   GFR: Estimated Creatinine Clearance: 68.5 mL/min (by C-G formula  based on SCr of 0.78 mg/dL). Liver Function Tests:  Recent Labs Lab 12/07/16 1718  AST 15  ALT 24  ALKPHOS 72  BILITOT 0.5  PROT 6.6  ALBUMIN 4.1   Cardiac Enzymes:  Recent Labs Lab 12/07/16 1718  TROPONINI <0.03   Sepsis Labs:  Lactic acid level 1.28  Radiological Exams on Admission: Dg Chest Port 1 View  Result Date: 12/07/2016 CLINICAL DATA:  Shortness of breath for 4 days. History of emphysema, lung reduction, hypertension. EXAM: PORTABLE CHEST 1 VIEW COMPARISON:  Chest radiograph November 25, 2016 FINDINGS: Cardiomediastinal silhouette is normal. Mildly calcified aortic knob. Severe COPD, increased lung volumes with apical bullous changes and surgical staples. Lung base pleural thickening. No pleural effusion or focal consolidation. No pneumothorax. Soft tissue planes and included osseous structures are nonsuspicious. IMPRESSION: Severe COPD and postsurgical changes RIGHT lung, stable examination. Electronically Signed   By: Elon Alas M.D.   On: 12/07/2016 17:49    EKG: Independently reviewed. NSR.  No acute ST elevation.  No significant changes compared to prior study.  Assessment/Plan Principal Problem:   Acute on chronic respiratory failure with hypoxia and hypercapnia (HCC) Active Problems:   History of pulmonary hypertension   Hypertension   COPD exacerbation (HCC)   OSA (obstructive sleep apnea)      COPD exacerbation with acute on chronic respiratory failure --Duonebs 4 times daily and q2h prn --Prednisone 40mg  daily; will need an extended taper at discharge --Will HOLD on giving additional antibiotics at this time; patient has completed a 7 day course of levaquin in the past two weeks. --Incentive spirometer --BiPAP qHS per home settings --Tessalon perles prn for cough --Activity as tolerated --Wean oxygen as tolerated --HOLD spiriva; continue Advair (or formulary equivalent)  Pulmonary  HTN --Sildenafil  HTN --Cardizem  GERD --PPI  BPH --Flomax, Proscar  Dyslipidemia --Statin  Other --Miralax, Restoril   DVT prophylaxis: Lovenox Code Status: DNI Family Communication: Patient alone in the ED.  He identifies his daughter as his next of kin/emergency contact. Disposition Plan: Expect  he will go home when ready for discharge. Consults called: NONE Admission status: Inpatient, med surg.  I expect this patient will need inpatient services for greater than two midnights.   TIME SPENT: 60 minutes   Eber Jones MD [facility] Pager [phone]  If 7PM-7AM, please contact night-coverage [email or web address] Password [email or web address]  12/07/2016, 7:22 PM

## 2016-12-07 NOTE — ED Triage Notes (Signed)
Per [facility]- Pt on home 02-2LNC. C/o of shortness of breath. Seen at pulmonology. Increased to [facility]. Improved however Wednesday c/o of increased SOB with exertion. Denies fever, cough, CP. Today  Home alone when exertion with SOB episode and called. Pt states 3 weeks ago pt up and active and has declined since then.

## 2016-12-07 NOTE — ED Notes (Signed)
Bed: WA09 Expected date:  Expected time:  Means of arrival:  Comments: dyspnea

## 2016-12-07 NOTE — ED Notes (Signed)
Pt unable to urinate, urinal by bedside.

## 2016-12-07 NOTE — ED Provider Notes (Signed)
[facility] DEPT Provider Note   CSN: [id number] Arrival date & time: 12/07/16  1637     History   Chief Complaint Chief Complaint  Patient presents with  . Shortness of Breath    HPI Andrew [name] is a 76 y.o. male.  Pt presents to the ED today with SOB.  The pt said that he's been sick for 2 weeks.  He did go to his doctor who told him that he had a COPD exacerbation and put him on prednisone.  He did feel like he was getting better, but sx worsened again in the past few days.  The pt said he can't walk across the room without severe SOB.  He is on 2L O2 at home all the time, O2 sats were only 90%, so EMS increased oxygen to 4.      Past Medical History:  Diagnosis Date  . BiPAP (biphasic positive airway pressure) dependence    Pt denies history of OSA  . Cancer ([place])    skin  . Dysphagia   . Emphysema lung ([place])   . GERD (gastroesophageal reflux disease)    " Silent reflux"  . Hearing loss    right ear  . Hypertension   . Oxygen dependent    2-3 liters  . Oxygen dependent   . Pneumonia   . Pulmonary hypertension     Patient Active Problem List   Diagnosis Date Noted  . Dysphagia 08/23/2016  . Normocytic anemia 06/28/2016  . Pressure sore on buttocks 06/28/2016  . Pulmonary hypertension 02/20/2016  . OSA (obstructive sleep apnea) 02/20/2016  . GERD (gastroesophageal reflux disease) 02/20/2016  . COPD exacerbation ([place]) 02/13/2016  . Acute respiratory failure with hypoxia ([place]) 02/12/2016  . Leukocytosis 02/11/2016  . Hypertension 02/11/2016  . Hyperlipidemia 02/11/2016  . CAP (community acquired pneumonia)   . BPH (benign prostatic hypertrophy) with urinary retention 12/29/2015  . Sepsis ([place]) 12/28/2015  . Acute on chronic respiratory failure with hypoxia ([place]) 12/28/2015  . 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 Chronic hypoxemic respiratory failure ([place]) 10/02/2015  . History of pulmonary hypertension 10/02/2015    Past Surgical History:    Procedure Laterality Date  . CARDIAC CATHETERIZATION    . CATARACT EXTRACTION W/ INTRAOCULAR LENS  IMPLANT, BILATERAL    . ESOPHAGOGASTRODUODENOSCOPY (EGD) WITH PROPOFOL N/A 08/23/2016   Procedure: ESOPHAGOGASTRODUODENOSCOPY (EGD) WITH PROPOFOL;  Surgeon: Wilford Corner, MD;  Location: [facility] ENDOSCOPY;  Service: Endoscopy;  Laterality: N/A;  . LUNG SURGERY    . TONSILLECTOMY         Home Medications    Prior to Admission medications   Medication Sig Start Date End Date Taking? Authorizing Provider  ADVAIR DISKUS 250-50 MCG/DOSE AEPB Inhale 1 puff into the lungs 2 (two) times daily. 11/01/15   Noralee Space, MD  albuterol (PROVENTIL HFA;VENTOLIN HFA) 108 (90 Base) MCG/ACT inhaler Inhale 2 puffs into the lungs every 6 (six) hours as needed for wheezing or shortness of breath. 01/03/16   Nita Sells, MD  diltiazem (CARTIA XT) 240 MG 24 hr capsule Take 240 mg by mouth at bedtime.    Historical Provider, MD  esomeprazole (NEXIUM) 20 MG capsule Take 20 mg by mouth 2 (two) times daily before a meal.     Historical Provider, MD  finasteride (PROSCAR) 5 MG tablet Take 5 mg by mouth at bedtime.     Historical Provider, MD  fluconazole (DIFLUCAN) 100 MG tablet Take 1 tablet (100 mg total) by mouth daily. 11/25/16  Noralee Space, MD  ipratropium-albuterol (DUONEB) 0.5-2.5 (3) MG/3ML SOLN Take 3 mLs by nebulization every 6 (six) hours as needed. Dx: J44.9 Patient taking differently: Take 3 mLs by nebulization every 6 (six) hours as needed (shortness of breath/ wheezing). Dx: [id number] 03/18/16   Noralee Space, MD  levofloxacin (LEVAQUIN) 500 MG tablet Take 1 tablet (500 mg total) by mouth daily. 11/25/16   Noralee Space, MD  liver oil-zinc oxide (DESITIN) 40 % ointment Apply 1 application topically See admin instructions. Apply to left butt cheek for wound care approximately every 4 hours while awake    Historical Provider, MD  Multiple Vitamins-Minerals (MULTIVITAMIN WITH MINERALS) tablet Take 1 tablet  by mouth daily.    Historical Provider, MD  OVER THE COUNTER MEDICATION Apply 1 application topically See admin instructions. Protective ointment from home health care - apply approximately every 4 hours to left butt cheek for wound care    Historical Provider, MD  OXYGEN Inhale 2-3 L into the lungs continuous. 2L daytime, 3L at night    Historical Provider, MD  polyethylene glycol (MIRALAX / GLYCOLAX) packet Take 17 g by mouth daily. Patient taking differently: Take 17 g by mouth at bedtime. Mix in 8 oz liquid and drink 05/08/16   Merrily Pew, MD  predniSONE (DELTASONE) 20 MG tablet Take as directed 11/25/16   Noralee Space, MD  rosuvastatin (CRESTOR) 10 MG tablet Take 10 mg by mouth daily.    Historical Provider, MD  sildenafil (REVATIO) 20 MG tablet Take 1 tablet (20 mg total) by mouth 2 (two) times daily. 10/23/16   Noralee Space, MD  SPIRIVA HANDIHALER 18 MCG inhalation capsule INHALE THE CONTENTS OF 1 CAPSULE EVERY DAY Patient taking differently: INHALE THE CONTENTS OF 1 CAPSULE EVERY DAY AT [facility] 04/23/16   Noralee Space, MD  tamsulosin (FLOMAX) 0.4 MG CAPS capsule Take 1 capsule (0.4 mg total) by mouth daily after breakfast. 01/03/16   Nita Sells, MD  temazepam (RESTORIL) 30 MG capsule Take 30 mg by mouth at bedtime.    Historical Provider, MD  vitamin C (ASCORBIC ACID) 500 MG tablet Take 500 mg by mouth daily.    Historical Provider, MD  VITAMIN E PO Take 1 capsule by mouth daily.    Historical Provider, MD    Family History Family History  Problem Relation Age of Onset  . Bone cancer Maternal Uncle     Social History Social History  Substance Use Topics  . Smoking status: Former Smoker    Packs/day: 0.00    Years: 0.00    Types: Cigarettes    Quit date: 10/01/1984  . Smokeless tobacco: Never Used  . Alcohol use No     Allergies   Patient has no known allergies.   Review of Systems Review of Systems  Respiratory: Positive for cough, shortness of breath and  wheezing.   All other systems reviewed and are negative.    Physical Exam Updated Vital Signs BP 156/88 (BP Location: Left Arm)   Pulse 93   Temp 97.9 F (36.6 C) (Oral)   Resp 18   SpO2 99% Comment: 4LNC  Physical Exam  Constitutional: He is oriented to person, place, and time. He appears well-developed. He appears distressed.  HENT:  Head: Normocephalic and atraumatic.  Right Ear: External ear normal.  Left Ear: External ear normal.  Nose: Nose normal.  Mouth/Throat: Oropharynx is clear and moist.  Eyes: Conjunctivae and EOM are normal. Pupils are equal, round, and  reactive to light.  Neck: Normal range of motion. Neck supple.  Cardiovascular: Normal rate, regular rhythm, normal heart sounds and intact distal pulses.   Pulmonary/Chest: Accessory muscle usage present. Tachypnea noted. He is in respiratory distress. He has wheezes.  Abdominal: Soft. Bowel sounds are normal.  Musculoskeletal: Normal range of motion.  Neurological: He is alert and oriented to person, place, and time.  Skin: Skin is warm and dry.  Psychiatric: He has a normal mood and affect. His behavior is normal. Judgment and thought content normal.  Nursing note and vitals reviewed.    ED Treatments / Results  Labs (all labs ordered are listed, but only abnormal results are displayed) Labs Reviewed  COMPREHENSIVE METABOLIC PANEL - Abnormal; Notable for the following:       Result Value   BUN 21 (*)    Calcium 8.7 (*)    All other components within normal limits  CBC WITH DIFFERENTIAL/PLATELET - Abnormal; Notable for the following:    WBC 17.6 (*)    Hemoglobin 12.4 (*)    HCT 37.7 (*)    Neutro Abs 16.1 (*)    Lymphs Abs 0.5 (*)    All other components within normal limits  BLOOD GAS, ARTERIAL - Abnormal; Notable for the following:    pO2, Arterial 146 (*)    Acid-Base Excess 2.4 (*)    All other components within normal limits  CULTURE, BLOOD (ROUTINE X 2)  CULTURE, BLOOD (ROUTINE X 2)   TROPONIN I  BRAIN NATRIURETIC PEPTIDE  URINALYSIS, ROUTINE W REFLEX MICROSCOPIC  I-STAT CG4 LACTIC ACID, ED    EKG  EKG Interpretation  Date/Time:  Saturday December 07 2016 16:56:49 EST Ventricular Rate:  87 PR Interval:    QRS Duration: 95 QT Interval:  360 QTC Calculation: 433 R Axis:   35 Text Interpretation:  Sinus rhythm Probable anteroseptal infarct, old Baseline wander in lead(s) III aVL V3 No significant change since last tracing Confirmed by [facility] MD, Chessa Barrasso ([id number]) on 12/07/2016 5:02:41 PM       Radiology Dg Chest Port 1 View  Result Date: 12/07/2016 CLINICAL DATA:  Shortness of breath for 4 days. History of emphysema, lung reduction, hypertension. EXAM: PORTABLE CHEST 1 VIEW COMPARISON:  Chest radiograph November 25, 2016 FINDINGS: Cardiomediastinal silhouette is normal. Mildly calcified aortic knob. Severe COPD, increased lung volumes with apical bullous changes and surgical staples. Lung base pleural thickening. No pleural effusion or focal consolidation. No pneumothorax. Soft tissue planes and included osseous structures are nonsuspicious. IMPRESSION: Severe COPD and postsurgical changes RIGHT lung, stable examination. Electronically Signed   By: Elon Alas M.D.   On: 12/07/2016 17:49    Procedures Procedures (including critical care time)  Medications Ordered in ED Medications  albuterol (PROVENTIL,VENTOLIN) solution continuous neb (10 mg/hr Nebulization Given 12/07/16 1817)  methylPREDNISolone sodium succinate (SOLU-MEDROL) 125 mg/2 mL injection 125 mg (not administered)     Initial Impression / Assessment and Plan / ED Course  I have reviewed the triage vital signs and the nursing notes.  Pertinent labs & imaging results that were available during my care of the patient were reviewed by me and considered in my medical decision making (see chart for details).     Pt has failed outpatient treatment.  He is improving with a continuous neb, but when  taken off, he is still sob.  I spoke with Dr. Eulas Post ([facility]) who will admit.  Final Clinical Impressions(s) / ED Diagnoses   Final diagnoses:  COPD exacerbation ([place])  Failure of outpatient treatment  Hypoxia    New Prescriptions New Prescriptions   No medications on file     Isla Pence, MD 12/07/16 [phone]

## 2016-12-08 DIAGNOSIS — G4733 Obstructive sleep apnea (adult) (pediatric): Secondary | ICD-10-CM

## 2016-12-08 LAB — BASIC METABOLIC PANEL
ANION GAP: 8 (ref 5–15)
BUN: 16 mg/dL (ref 6–20)
CHLORIDE: 102 mmol/L (ref 101–111)
CO2: 26 mmol/L (ref 22–32)
Calcium: 8.3 mg/dL — ABNORMAL LOW (ref 8.9–10.3)
Creatinine, Ser: 0.83 mg/dL (ref 0.61–1.24)
GFR calc Af Amer: >60 mL/min (ref >60)
GLUCOSE: 100 mg/dL — AB (ref 65–99)
POTASSIUM: 3.9 mmol/L (ref 3.5–5.1)
Sodium: 136 mmol/L (ref 135–145)

## 2016-12-08 LAB — CBC
HEMATOCRIT: 35.6 % — AB (ref 39.0–52.0)
HEMOGLOBIN: 11.8 g/dL — AB (ref 13.0–17.0)
MCH: 29.5 pg (ref 26.0–34.0)
MCHC: 33.1 g/dL (ref 30.0–36.0)
MCV: 89 fL (ref 78.0–100.0)
Platelets: 260 10*3/uL (ref 150–400)
RBC: 4 MIL/uL — ABNORMAL LOW (ref 4.22–5.81)
RDW: 15.6 % — ABNORMAL HIGH (ref 11.5–15.5)
WBC: 21.4 10*3/uL — AB (ref 4.0–10.5)

## 2016-12-08 LAB — INFLUENZA PANEL BY PCR (TYPE A & B)
INFLBPCR: NEGATIVE
Influenza A By PCR: NEGATIVE

## 2016-12-08 MED ORDER — METHYLPREDNISOLONE SODIUM SUCC 125 MG IJ SOLR
80.0000 mg | Freq: Three times a day (TID) | INTRAMUSCULAR | Status: DC
  Administered 2016-12-08 – 2016-12-09 (×3): 80 mg via INTRAVENOUS
  Filled 2016-12-08 (×3): qty 2

## 2016-12-08 MED ORDER — PANTOPRAZOLE SODIUM 40 MG PO TBEC
40.0000 mg | DELAYED_RELEASE_TABLET | Freq: Two times a day (BID) | ORAL | Status: DC
  Administered 2016-12-08 – 2016-12-11 (×6): 40 mg via ORAL
  Filled 2016-12-08 (×6): qty 1

## 2016-12-08 MED ORDER — IPRATROPIUM-ALBUTEROL 0.5-2.5 (3) MG/3ML IN SOLN
3.0000 mL | Freq: Three times a day (TID) | RESPIRATORY_TRACT | Status: DC
  Administered 2016-12-08 – 2016-12-11 (×9): 3 mL via RESPIRATORY_TRACT
  Filled 2016-12-08 (×9): qty 3

## 2016-12-08 NOTE — Progress Notes (Addendum)
PROGRESS NOTE                                                                                                                                                                                                             Patient Demographics:    Andrew [name], is a 76 y.o. male, DOB - 1941-03-16, [id number]  Admit date - 12/07/2016   Admitting Physician Lily Kocher, MD  Outpatient Primary MD for the patient is Lujean Amel, MD  LOS - 1  Outpatient Specialists: Pulm Dr Lenna Gilford  Chief Complaint  Patient presents with  . Shortness of Breath       Brief Narrative   76 y.o. gentleman with a history of severe COPD, chronic respiratory failure with hypoxia and hypercarbia, O2 dependence 2L [place] at baseline, BiPAP dependence qHS, pulmonary HTN, and essential HTN , Who presents with shortness of breath, admitted for COPD history patient.   Subjective:    Andrew [name] today has, No headache, No chest pain, No abdominal pain - No Nausea, Complains of shortness of breath  Assessment  & Plan :    Principal Problem:   Acute on chronic respiratory failure with hypoxia and hypercapnia (HCC) Active Problems:   History of pulmonary hypertension   Hypertension   COPD exacerbation (HCC)   OSA (obstructive sleep apnea)   COPD exacerbation with acute on chronic respiratory failure - Patient recently on prednisone by pulmonary, and finished seven-day Levaquin course without much help. - Will continue with IV Solu-Medrol, scheduled nebulizers, monitor part and incentive spirometry. - Will HOLD on giving additional antibiotics at this time; patient has completed a 7 day course of levaquin in the past two weeks. - Incentive spirometer - HOLD spiriva; continue Advair (or formulary equivalent) - Leukocytosis most likely related to steroids before admission, nontoxic appearing, afebrile - Patient with history of OSA, on BiPAP daily at bedtime  Pulmonary HTN -  Sildenafil  HTN - Cardizem  GERD - PPI  BPH - Flomax, Proscar  Dyslipidemia - Statin  Other - Miralax, Restoril  Code Status : partial  Family Communication  : none at bedside  Disposition Plan  : home when stable  Consults  :  none  Procedures  : none  DVT Prophylaxis  :  Lovenox  Lab Results  Component Value Date   PLT 260 12/08/2016  Antibiotics  :    Anti-infectives    None        Objective:   Vitals:   12/08/16 0645 12/08/16 0810 12/08/16 1339 12/08/16 1447  BP:   114/74   Pulse:   89 85  Resp:   16 18  Temp:   98.8 F (37.1 C)   TempSrc:   Oral   SpO2: 97% 98% 97% 98%    Wt Readings from Last 3 Encounters:  11/25/16 62.6 kg (138 lb)  11/06/16 61.9 kg (136 lb 6.4 oz)  10/23/16 61.1 kg (134 lb 12.8 oz)     Intake/Output Summary (Last 24 hours) at 12/08/16 1459 Last data filed at 12/08/16 0802  Gross per 24 hour  Intake              120 ml  Output             1150 ml  Net            -1030 ml     Physical Exam  Awake Alert, Oriented X 3, No new F.N deficits, Normal affect [place].AT,PERRAL Supple Neck,No JVD, No cervical lymphadenopathy appriciated.  Symmetrical Chest wall movement, Good air movement bilaterally, mild wheezing  RRR,No Gallops,Rubs or new Murmurs, No Parasternal Heave +ve B.Sounds, Abd Soft, No tenderness, No rebound - guarding or rigidity. No Cyanosis, Clubbing or edema, No new Rash or bruise      Data Review:    CBC  Recent Labs Lab 12/07/16 1718 12/08/16 0623  WBC 17.6* 21.4*  HGB 12.4* 11.8*  HCT 37.7* 35.6*  PLT 269 260  MCV 89.1 89.0  MCH 29.3 29.5  MCHC 32.9 33.1  RDW 15.5 15.6*  LYMPHSABS 0.5*  --   MONOABS 1.0  --   EOSABS 0.0  --   BASOSABS 0.0  --     Chemistries   Recent Labs Lab 12/07/16 1718 12/08/16 0623  NA 138 136  K 4.5 3.9  CL 103 102  CO2 28 26  GLUCOSE 85 100*  BUN 21* 16  CREATININE 0.78 0.83  CALCIUM 8.7* 8.3*  AST 15  --   ALT 24  --   ALKPHOS 72  --     BILITOT 0.5  --    ------------------------------------------------------------------------------------------------------------------ No results for input(s): CHOL, HDL, LDLCALC, TRIG, CHOLHDL, LDLDIRECT in the last 72 hours.  Lab Results  Component Value Date   HGBA1C 6.1 11/25/2016   ------------------------------------------------------------------------------------------------------------------ No results for input(s): TSH, T4TOTAL, T3FREE, THYROIDAB in the last 72 hours.  Invalid input(s): FREET3 ------------------------------------------------------------------------------------------------------------------ No results for input(s): VITAMINB12, FOLATE, FERRITIN, TIBC, IRON, RETICCTPCT in the last 72 hours.  Coagulation profile No results for input(s): INR, PROTIME in the last 168 hours.  No results for input(s): DDIMER in the last 72 hours.  Cardiac Enzymes  Recent Labs Lab 12/07/16 1718  TROPONINI <0.03   ------------------------------------------------------------------------------------------------------------------    Component Value Date/Time   BNP 26.5 12/07/2016 1718    Inpatient Medications  Scheduled Meds: . diltiazem  240 mg Oral QHS  . enoxaparin (LOVENOX) injection  40 mg Subcutaneous QHS  . finasteride  5 mg Oral QHS  . ipratropium-albuterol  3 mL Nebulization TID  . methylPREDNISolone (SOLU-MEDROL) injection  80 mg Intravenous Q8H  . mometasone-formoterol  2 puff Inhalation BID  . pantoprazole  40 mg Oral BID  . polyethylene glycol  17 g Oral QHS  . rosuvastatin  10 mg Oral Daily  . sildenafil  20 mg Oral BID  . tamsulosin  0.4 mg Oral QPC breakfast  . temazepam  30 mg Oral QHS   Continuous Infusions: PRN Meds:.acetaminophen **OR** acetaminophen, benzonatate, ipratropium-albuterol, ondansetron **OR** ondansetron (ZOFRAN) IV  Micro Results No results found for this or any previous visit (from the past 240 hour(s)).  Radiology Reports Dg  Chest 2 View  Result Date: 11/25/2016 CLINICAL DATA:  Productive cough . EXAM: CHEST  2 VIEW COMPARISON:  06/28/2016. FINDINGS: Mediastinum and hilar structures are normal. Bilateral pleuroparenchymal thickening noted consistent with scarring. Prominent bullous changes noted consistent with COPD. Heart size normal. No acute bony abnormality. IMPRESSION: Bilateral pleural-parenchymal thickening noted consistent with scarring. Prominent bullous changes noted consistent with COPD. Electronically Signed   By: Marcello Moores  Register   On: 11/25/2016 13:36   Dg Chest Port 1 View  Result Date: 12/07/2016 CLINICAL DATA:  Shortness of breath for 4 days. History of emphysema, lung reduction, hypertension. EXAM: PORTABLE CHEST 1 VIEW COMPARISON:  Chest radiograph November 25, 2016 FINDINGS: Cardiomediastinal silhouette is normal. Mildly calcified aortic knob. Severe COPD, increased lung volumes with apical bullous changes and surgical staples. Lung base pleural thickening. No pleural effusion or focal consolidation. No pneumothorax. Soft tissue planes and included osseous structures are nonsuspicious. IMPRESSION: Severe COPD and postsurgical changes RIGHT lung, stable examination. Electronically Signed   By: Elon Alas M.D.   On: 12/07/2016 17:49    Lilyahna Sirmon M.D on 12/08/2016 at 2:59 PM  Between 7am to 7pm - Pager - [phone]  After 7pm go to [email or web address] - password [facility]  [facility] -  Office  [phone]

## 2016-12-09 ENCOUNTER — Encounter (HOSPITAL_COMMUNITY): Payer: Self-pay

## 2016-12-09 DIAGNOSIS — E43 Unspecified severe protein-calorie malnutrition: Secondary | ICD-10-CM | POA: Diagnosis present

## 2016-12-09 LAB — BASIC METABOLIC PANEL
Anion gap: 7 (ref 5–15)
BUN: 27 mg/dL — AB (ref 6–20)
CHLORIDE: 105 mmol/L (ref 101–111)
CO2: 25 mmol/L (ref 22–32)
Calcium: 9.1 mg/dL (ref 8.9–10.3)
Creatinine, Ser: 0.82 mg/dL (ref 0.61–1.24)
Glucose, Bld: 152 mg/dL — ABNORMAL HIGH (ref 65–99)
Potassium: 4.3 mmol/L (ref 3.5–5.1)
SODIUM: 137 mmol/L (ref 135–145)

## 2016-12-09 LAB — CBC
HCT: 33.2 % — ABNORMAL LOW (ref 39.0–52.0)
HEMOGLOBIN: 10.9 g/dL — AB (ref 13.0–17.0)
MCH: 28.5 pg (ref 26.0–34.0)
MCHC: 32.8 g/dL (ref 30.0–36.0)
MCV: 86.9 fL (ref 78.0–100.0)
PLATELETS: 236 10*3/uL (ref 150–400)
RBC: 3.82 MIL/uL — AB (ref 4.22–5.81)
RDW: 15.5 % (ref 11.5–15.5)
WBC: 12 10*3/uL — ABNORMAL HIGH (ref 4.0–10.5)

## 2016-12-09 MED ORDER — METHYLPREDNISOLONE SODIUM SUCC 40 MG IJ SOLR
40.0000 mg | Freq: Three times a day (TID) | INTRAMUSCULAR | Status: AC
  Administered 2016-12-09 – 2016-12-10 (×5): 40 mg via INTRAVENOUS
  Filled 2016-12-09 (×6): qty 1

## 2016-12-09 MED ORDER — PREMIER PROTEIN SHAKE
11.0000 [oz_av] | ORAL | Status: DC
  Administered 2016-12-09 – 2016-12-10 (×2): 11 [oz_av] via ORAL
  Filled 2016-12-09 (×3): qty 325.31

## 2016-12-09 MED ORDER — BOOST / RESOURCE BREEZE PO LIQD
1.0000 | Freq: Two times a day (BID) | ORAL | Status: DC
  Administered 2016-12-09 – 2016-12-10 (×2): 1 via ORAL

## 2016-12-09 MED ORDER — MORPHINE SULFATE (PF) 2 MG/ML IV SOLN
2.0000 mg | Freq: Once | INTRAVENOUS | Status: AC
  Administered 2016-12-09: 2 mg via INTRAVENOUS
  Filled 2016-12-09: qty 1

## 2016-12-09 NOTE — Progress Notes (Signed)
Initial Nutrition Assessment  DOCUMENTATION CODES:   Severe malnutrition in context of chronic illness  INTERVENTION:  Premier Protein daily- provides 160kcal and 30g protein per serving  Magic cup TID with meals, each supplement provides 290 kcal and 9 grams of protein  Boost Breeze po BID, each supplement provides 250 kcal and 9 grams of protein  NUTRITION DIAGNOSIS:   Malnutrition related to catabolic illness as evidenced by severe depletion of body fat, severe depletion of muscle mass.  GOAL:   Patient will meet greater than or equal to 90% of their needs  MONITOR:   PO intake, Supplement acceptance  REASON FOR ASSESSMENT:   Consult Assessment of nutrition requirement/status  ASSESSMENT:   76 y.o. gentleman with a history of severe COPD, chronic respiratory failure with hypoxia and hypercarbia, O2 dependence 2L [place] at baseline, BiPAP dependence qHS, pulmonary HTN, and essential HTN , Who presents with shortness of breath, admitted for COPD history patient.   Met with pt in room today. Pt reports good appetite now and pta. Pt eating 100% meals. Per chart, pt is weight stable. Pt does not like Ensure and would like to try different supplements. RD will order different supplements for pt to try.   Medications reviewed and include: lovenox, solu-medrol, protonix, miralax  Labs reviewed: BUN 27(H) WBC- 12(H)  Nutrition-Focused physical exam completed. Findings are severe fat depletion, severe muscle depletion, and no edema.   Diet Order:  Diet regular Room service appropriate? Yes; Fluid consistency: Thin  Skin:  Reviewed, no issues  Last BM:  1/15  Height:   Ht Readings from Last 1 Encounters:  12/08/16 5' 7" (1.702 m)    Weight:   Wt Readings from Last 1 Encounters:  12/08/16 138 lb 14.2 oz (63 kg)    Ideal Body Weight:  67.2 kg  BMI:  Body mass index is 21.75 kg/m.  Estimated Nutritional Needs:   Kcal:  1900-2100kcal/day   Protein:  94-106g/day    Fluid:  >1.9L/day   EDUCATION NEEDS:   Education needs no appropriate at this time  Koleen Distance, RD, [place] Pager #[phone]

## 2016-12-09 NOTE — Progress Notes (Addendum)
PROGRESS NOTE                                                                                                                                                                                                             Patient Demographics:    Andrew [name], is a 76 y.o. male, DOB - 1941/05/07, [id number]  Admit date - 12/07/2016   Admitting Physician Lily Kocher, MD  Outpatient Primary MD for the patient is Lujean Amel, MD  LOS - 2  Outpatient Specialists: Pulm Dr Lenna Gilford  Chief Complaint  Patient presents with  . Shortness of Breath       Brief Narrative   76 y.o. gentleman with a history of severe COPD, chronic respiratory failure with hypoxia and hypercarbia, O2 dependence 2L [place] at baseline, BiPAP dependence qHS, pulmonary HTN, and essential HTN , Who presents with shortness of breath, admitted for COPD history patient.   Subjective:    Andrew [name] today has, No headache, No chest pain, No abdominal pain - No Nausea, Complains of shortness of breath  Assessment  & Plan :    Principal Problem:   Acute on chronic respiratory failure with hypoxia and hypercapnia (HCC) Active Problems:   History of pulmonary hypertension   Hypertension   COPD exacerbation (HCC)   OSA (obstructive sleep apnea)   Protein-calorie malnutrition, severe   COPD exacerbation with acute on chronic respiratory failure - Patient recently on prednisone by pulmonary, and finished seven-day Levaquin course without much help. - on IV Solu-Medrol, Wheezing has improved today, so we'll decrease IV steroids dose .scheduled nebulizers, monitor part and incentive spirometry. - Will HOLD on giving additional antibiotics at this time; patient has completed a 7 day course of levaquin in the past two weeks. - Incentive spirometer - HOLD spiriva; continue Advair (or formulary equivalent) - Leukocytosis most likely related to steroids before admission, nontoxic appearing,  afebrile - Patient with history of OSA, on BiPAP daily at bedtime  Pulmonary HTN - Sildenafil  HTN - Cardizem  GERD - PPI  BPH - Flomax, Proscar  Dyslipidemia - Statin  Severe protein calorie malnutrition - Continue supplement  Other - Miralax, Restoril  Code Status : partial  Family Communication  : none at bedside  Disposition Plan  : home when stable, in 1-2 days  Consults  :  none  Procedures  :  none  DVT Prophylaxis  :  Lovenox  Lab Results  Component Value Date   PLT 236 12/09/2016    Antibiotics  :    Anti-infectives    None        Objective:   Vitals:   12/08/16 2111 12/08/16 2307 12/09/16 0551 12/09/16 0825  BP: 121/66  (!) 109/56   Pulse: 87  71   Resp: 18 18 18    Temp: 98.6 F (37 C)  98.3 F (36.8 C)   TempSrc: Oral  Oral   SpO2: 97%  99% 97%  Weight:      Height:        Wt Readings from Last 3 Encounters:  12/08/16 63 kg (138 lb 14.2 oz)  11/25/16 62.6 kg (138 lb)  11/06/16 61.9 kg (136 lb 6.4 oz)     Intake/Output Summary (Last 24 hours) at 12/09/16 1418 Last data filed at 12/09/16 0807  Gross per 24 hour  Intake              360 ml  Output                0 ml  Net              360 ml     Physical Exam  Awake Alert, Oriented X 3, No new F.N deficits, Normal affect [place].AT,PERRAL Supple Neck,No JVD, No cervical lymphadenopathy appriciated.  Symmetrical Chest wall movement, Good air movement bilaterally, no wheezing  RRR,No Gallops,Rubs or new Murmurs, No Parasternal Heave +ve B.Sounds, Abd Soft, No tenderness, No rebound - guarding or rigidity. No Cyanosis, Clubbing or edema, No new Rash or bruise      Data Review:    CBC  Recent Labs Lab 12/07/16 1718 12/08/16 0623 12/09/16 0556  WBC 17.6* 21.4* 12.0*  HGB 12.4* 11.8* 10.9*  HCT 37.7* 35.6* 33.2*  PLT 269 260 236  MCV 89.1 89.0 86.9  MCH 29.3 29.5 28.5  MCHC 32.9 33.1 32.8  RDW 15.5 15.6* 15.5  LYMPHSABS 0.5*  --   --   MONOABS 1.0  --   --     EOSABS 0.0  --   --   BASOSABS 0.0  --   --     Chemistries   Recent Labs Lab 12/07/16 1718 12/08/16 0623 12/09/16 0556  NA 138 136 137  K 4.5 3.9 4.3  CL 103 102 105  CO2 28 26 25   GLUCOSE 85 100* 152*  BUN 21* 16 27*  CREATININE 0.78 0.83 0.82  CALCIUM 8.7* 8.3* 9.1  AST 15  --   --   ALT 24  --   --   ALKPHOS 72  --   --   BILITOT 0.5  --   --    ------------------------------------------------------------------------------------------------------------------ No results for input(s): CHOL, HDL, LDLCALC, TRIG, CHOLHDL, LDLDIRECT in the last 72 hours.  Lab Results  Component Value Date   HGBA1C 6.1 11/25/2016   ------------------------------------------------------------------------------------------------------------------ No results for input(s): TSH, T4TOTAL, T3FREE, THYROIDAB in the last 72 hours.  Invalid input(s): FREET3 ------------------------------------------------------------------------------------------------------------------ No results for input(s): VITAMINB12, FOLATE, FERRITIN, TIBC, IRON, RETICCTPCT in the last 72 hours.  Coagulation profile No results for input(s): INR, PROTIME in the last 168 hours.  No results for input(s): DDIMER in the last 72 hours.  Cardiac Enzymes  Recent Labs Lab 12/07/16 1718  TROPONINI <0.03   ------------------------------------------------------------------------------------------------------------------    Component Value Date/Time   BNP 26.5 12/07/2016 1718    Inpatient Medications  Scheduled  Meds: . diltiazem  240 mg Oral QHS  . enoxaparin (LOVENOX) injection  40 mg Subcutaneous QHS  . feeding supplement  1 Container Oral BID BM  . finasteride  5 mg Oral QHS  . ipratropium-albuterol  3 mL Nebulization TID  . methylPREDNISolone (SOLU-MEDROL) injection  40 mg Intravenous Q8H  . mometasone-formoterol  2 puff Inhalation BID  . pantoprazole  40 mg Oral BID  . polyethylene glycol  17 g Oral QHS  .  protein supplement shake  11 oz Oral Q24H  . rosuvastatin  10 mg Oral Daily  . sildenafil  20 mg Oral BID  . tamsulosin  0.4 mg Oral QPC breakfast  . temazepam  30 mg Oral QHS   Continuous Infusions: PRN Meds:.acetaminophen **OR** acetaminophen, benzonatate, ipratropium-albuterol, ondansetron **OR** ondansetron (ZOFRAN) IV  Micro Results No results found for this or any previous visit (from the past 240 hour(s)).  Radiology Reports Dg Chest 2 View  Result Date: 11/25/2016 CLINICAL DATA:  Productive cough . EXAM: CHEST  2 VIEW COMPARISON:  06/28/2016. FINDINGS: Mediastinum and hilar structures are normal. Bilateral pleuroparenchymal thickening noted consistent with scarring. Prominent bullous changes noted consistent with COPD. Heart size normal. No acute bony abnormality. IMPRESSION: Bilateral pleural-parenchymal thickening noted consistent with scarring. Prominent bullous changes noted consistent with COPD. Electronically Signed   By: Marcello Moores  Register   On: 11/25/2016 13:36   Dg Chest Port 1 View  Result Date: 12/07/2016 CLINICAL DATA:  Shortness of breath for 4 days. History of emphysema, lung reduction, hypertension. EXAM: PORTABLE CHEST 1 VIEW COMPARISON:  Chest radiograph November 25, 2016 FINDINGS: Cardiomediastinal silhouette is normal. Mildly calcified aortic knob. Severe COPD, increased lung volumes with apical bullous changes and surgical staples. Lung base pleural thickening. No pleural effusion or focal consolidation. No pneumothorax. Soft tissue planes and included osseous structures are nonsuspicious. IMPRESSION: Severe COPD and postsurgical changes RIGHT lung, stable examination. Electronically Signed   By: Elon Alas M.D.   On: 12/07/2016 17:49    Zarinah Oviatt M.D on 12/09/2016 at 2:18 PM  Between 7am to 7pm - Pager - [phone]  After 7pm go to [email or web address] - password [facility]  [facility] -  Office  [phone]

## 2016-12-09 NOTE — Progress Notes (Signed)
Pt experiencing increased work of breathing. After 2 PRN breathing treatments, patient still experiencing work of breathing. Paged MD. One time order for 2mg  IV morphine. Administered medication. Continue to monitor.

## 2016-12-10 MED ORDER — ALUM & MAG HYDROXIDE-SIMETH 200-200-20 MG/5ML PO SUSP
30.0000 mL | Freq: Four times a day (QID) | ORAL | Status: DC | PRN
  Administered 2016-12-10: 30 mL via ORAL
  Filled 2016-12-10: qty 30

## 2016-12-10 MED ORDER — PREDNISONE 20 MG PO TABS
40.0000 mg | ORAL_TABLET | Freq: Every day | ORAL | Status: DC
  Administered 2016-12-11: 40 mg via ORAL
  Filled 2016-12-10: qty 2

## 2016-12-10 NOTE — Progress Notes (Signed)
PROGRESS NOTE                                                                                                                                                                                                             Patient Demographics:    Andrew [name], is a 76 y.o. male, DOB - Feb 03, 1941, [id number]  Admit date - 12/07/2016   Admitting Physician Lily Kocher, MD  Outpatient Primary MD for the patient is Lujean Amel, MD  LOS - 3  Outpatient Specialists: Pulm Dr Lenna Gilford  Chief Complaint  Patient presents with  . Shortness of Breath       Brief Narrative   76 y.o. gentleman with a history of severe COPD, chronic respiratory failure with hypoxia and hypercarbia, O2 dependence 2L [place] at baseline, BiPAP dependence qHS, pulmonary HTN, and essential HTN , Who presents with shortness of breath, admitted for COPD Exacerbation   Subjective:    Andrew [name] today has, No headache, No chest pain, No abdominal pain - No Nausea, Had an episode of dyspnea yesterday, reports he is feeling much better this morning.  Assessment  & Plan :    Principal Problem:   Acute on chronic respiratory failure with hypoxia and hypercapnia (HCC) Active Problems:   History of pulmonary hypertension   Hypertension   COPD exacerbation (HCC)   OSA (obstructive sleep apnea)   Protein-calorie malnutrition, severe   COPD exacerbation with acute on chronic respiratory failure - Patient recently on prednisone by pulmonary, and finished seven-day Levaquin course without much help. - on IV Solu-Medrol, Wheezing has improved today, so we'll decrease IV steroids dose .scheduled nebulizers, Continues to improve, was transitioned to oral prednisone in a.m.Marland Kitchen - Will HOLD on giving additional antibiotics at this time; patient has completed a 7 day course of levaquin in the past two weeks. - Incentive spirometer - HOLD spiriva; continue Advair (or formulary equivalent) -  Leukocytosis most likely related to steroids before admission, nontoxic appearing, afebrile, trending down - Patient with history of OSA, on BiPAP daily at bedtime  Pulmonary HTN - Sildenafil  HTN - Cardizem  GERD - PPI  BPH - Flomax, Proscar  Dyslipidemia - Statin  Severe protein calorie malnutrition - Continue supplement  Other - Miralax, Restoril  Code Status : partial  Family Communication  : none at bedside  Disposition Plan  :  home when stable,hopefully in one day   Consults  :  none  Procedures  : none  DVT Prophylaxis  :  Lovenox  Lab Results  Component Value Date   PLT 236 12/09/2016    Antibiotics  :    Anti-infectives    None        Objective:   Vitals:   12/09/16 2003 12/09/16 2027 12/10/16 0502 12/10/16 0757  BP:  129/61 129/75   Pulse:  94 86   Resp:  20 18   Temp:  98.5 F (36.9 C) 98 F (36.7 C)   TempSrc:  Oral Oral   SpO2: 98% 94% 90% 94%  Weight:      Height:        Wt Readings from Last 3 Encounters:  12/08/16 63 kg (138 lb 14.2 oz)  11/25/16 62.6 kg (138 lb)  11/06/16 61.9 kg (136 lb 6.4 oz)     Intake/Output Summary (Last 24 hours) at 12/10/16 1503 Last data filed at 12/10/16 1300  Gross per 24 hour  Intake              300 ml  Output                0 ml  Net              300 ml     Physical Exam  Awake Alert, Oriented X 3, No new F.N deficits, Normal affect [place].AT,PERRAL Supple Neck,No JVD, No cervical lymphadenopathy appriciated.  Symmetrical Chest wall movement, Good air movement bilaterally, no wheezing  RRR,No Gallops,Rubs or new Murmurs, No Parasternal Heave +ve B.Sounds, Abd Soft, No tenderness, No rebound - guarding or rigidity. No Cyanosis, Clubbing or edema, No new Rash or bruise      Data Review:    CBC  Recent Labs Lab 12/07/16 1718 12/08/16 0623 12/09/16 0556  WBC 17.6* 21.4* 12.0*  HGB 12.4* 11.8* 10.9*  HCT 37.7* 35.6* 33.2*  PLT 269 260 236  MCV 89.1 89.0 86.9  MCH 29.3  29.5 28.5  MCHC 32.9 33.1 32.8  RDW 15.5 15.6* 15.5  LYMPHSABS 0.5*  --   --   MONOABS 1.0  --   --   EOSABS 0.0  --   --   BASOSABS 0.0  --   --     Chemistries   Recent Labs Lab 12/07/16 1718 12/08/16 0623 12/09/16 0556  NA 138 136 137  K 4.5 3.9 4.3  CL 103 102 105  CO2 28 26 25   GLUCOSE 85 100* 152*  BUN 21* 16 27*  CREATININE 0.78 0.83 0.82  CALCIUM 8.7* 8.3* 9.1  AST 15  --   --   ALT 24  --   --   ALKPHOS 72  --   --   BILITOT 0.5  --   --    ------------------------------------------------------------------------------------------------------------------ No results for input(s): CHOL, HDL, LDLCALC, TRIG, CHOLHDL, LDLDIRECT in the last 72 hours.  Lab Results  Component Value Date   HGBA1C 6.1 11/25/2016   ------------------------------------------------------------------------------------------------------------------ No results for input(s): TSH, T4TOTAL, T3FREE, THYROIDAB in the last 72 hours.  Invalid input(s): FREET3 ------------------------------------------------------------------------------------------------------------------ No results for input(s): VITAMINB12, FOLATE, FERRITIN, TIBC, IRON, RETICCTPCT in the last 72 hours.  Coagulation profile No results for input(s): INR, PROTIME in the last 168 hours.  No results for input(s): DDIMER in the last 72 hours.  Cardiac Enzymes  Recent Labs Lab 12/07/16 1718  TROPONINI <0.03   ------------------------------------------------------------------------------------------------------------------    Component  Value Date/Time   BNP 26.5 12/07/2016 1718    Inpatient Medications  Scheduled Meds: . diltiazem  240 mg Oral QHS  . enoxaparin (LOVENOX) injection  40 mg Subcutaneous QHS  . feeding supplement  1 Container Oral BID BM  . finasteride  5 mg Oral QHS  . ipratropium-albuterol  3 mL Nebulization TID  . methylPREDNISolone (SOLU-MEDROL) injection  40 mg Intravenous Q8H  . mometasone-formoterol   2 puff Inhalation BID  . pantoprazole  40 mg Oral BID  . polyethylene glycol  17 g Oral QHS  . protein supplement shake  11 oz Oral Q24H  . rosuvastatin  10 mg Oral Daily  . sildenafil  20 mg Oral BID  . tamsulosin  0.4 mg Oral QPC breakfast  . temazepam  30 mg Oral QHS   Continuous Infusions: PRN Meds:.acetaminophen **OR** acetaminophen, benzonatate, ipratropium-albuterol, ondansetron **OR** ondansetron (ZOFRAN) IV  Micro Results Recent Results (from the past 240 hour(s))  Blood culture (routine x 2)     Status: None (Preliminary result)   Collection Time: 12/07/16  5:18 PM  Result Value Ref Range Status   Specimen Description BLOOD RIGHT ANTECUBITAL  Final   Special Requests BOTTLES DRAWN AEROBIC AND ANAEROBIC 5 CC EACH  Final   Culture   Final    NO GROWTH 1 DAY Performed at [facility], [place] [address], [place], [place] [address]    Report Status PENDING  Incomplete  Blood culture (routine x 2)     Status: None (Preliminary result)   Collection Time: 12/07/16  5:18 PM  Result Value Ref Range Status   Specimen Description BLOOD BLOOD RIGHT FOREARM  Final   Special Requests BOTTLES DRAWN AEROBIC AND ANAEROBIC 5 CC EACH  Final   Culture   Final    NO GROWTH 1 DAY Performed at [facility], [place] [address], [place], [place] [address]    Report Status PENDING  Incomplete    Radiology Reports Dg Chest 2 View  Result Date: 11/25/2016 CLINICAL DATA:  Productive cough . EXAM: CHEST  2 VIEW COMPARISON:  06/28/2016. FINDINGS: Mediastinum and hilar structures are normal. Bilateral pleuroparenchymal thickening noted consistent with scarring. Prominent bullous changes noted consistent with COPD. Heart size normal. No acute bony abnormality. IMPRESSION: Bilateral pleural-parenchymal thickening noted consistent with scarring. Prominent bullous changes noted consistent with COPD. Electronically Signed   By: Marcello Moores  Register   On: 11/25/2016 13:36   Dg Chest Port 1  View  Result Date: 12/07/2016 CLINICAL DATA:  Shortness of breath for 4 days. History of emphysema, lung reduction, hypertension. EXAM: PORTABLE CHEST 1 VIEW COMPARISON:  Chest radiograph November 25, 2016 FINDINGS: Cardiomediastinal silhouette is normal. Mildly calcified aortic knob. Severe COPD, increased lung volumes with apical bullous changes and surgical staples. Lung base pleural thickening. No pleural effusion or focal consolidation. No pneumothorax. Soft tissue planes and included osseous structures are nonsuspicious. IMPRESSION: Severe COPD and postsurgical changes RIGHT lung, stable examination. Electronically Signed   By: Elon Alas M.D.   On: 12/07/2016 17:49    Alaisa Moffitt M.D on 12/10/2016 at 3:03 PM  Between 7am to 7pm - Pager - [phone]  After 7pm go to [email or web address] - password [facility]  [facility] -  Office  [phone]

## 2016-12-10 NOTE — Progress Notes (Signed)
Pt has been c/o SOB since dinner. States "everytime I eat a full meal my stomach gets bloated and it makes it hard to breathe." Gave maalox and respiratory therapist assessed as wellas administered scheduled breathing treatment. Will continue to monitor.

## 2016-12-10 NOTE — Progress Notes (Signed)
Ambulated pt approximately 332ft on 2L O2. Patient tolerated fairly.

## 2016-12-11 ENCOUNTER — Encounter (HOSPITAL_COMMUNITY): Payer: Self-pay

## 2016-12-11 DIAGNOSIS — J9622 Acute and chronic respiratory failure with hypercapnia: Secondary | ICD-10-CM

## 2016-12-11 DIAGNOSIS — J9621 Acute and chronic respiratory failure with hypoxia: Secondary | ICD-10-CM

## 2016-12-11 MED ORDER — PREDNISONE 10 MG PO TABS: ORAL_TABLET | ORAL | 0 refills | Status: DC

## 2016-12-11 NOTE — Care Management Note (Signed)
Case Management Note  Patient Details  Name: Andrew [name] MRN: [id number] Date of Birth: 10-Dec-1940  Subjective/Objective: d/c home today. Spoke to nurse-patient already has home 02, & travel tank. No orders or CM needs.                   Action/Plan:d/c home.   Expected Discharge Date:  12/11/16               Expected Discharge Plan:  Home/Self Care  In-House Referral:     Discharge planning Services  CM Consult  Post Acute Care Choice:  Durable Medical Equipment (home 02-travel tank) Choice offered to:     DME Arranged:    DME Agency:     HH Arranged:    HH Agency:     Status of Service:  Completed, signed off  If discussed at [employer] of Stay Meetings, dates discussed:    Additional Comments:  Dessa Phi, RN 12/11/2016, 11:38 AM

## 2016-12-11 NOTE — Discharge Instructions (Signed)
Chronic Obstructive Pulmonary Disease Exacerbation  Chronic obstructive pulmonary disease (COPD) is a common lung problem. In COPD, the flow of air from the lungs is limited. COPD exacerbations are times that breathing gets worse and you need extra treatment. Without treatment they can be life threatening. If they happen often, your lungs can become more damaged. If your COPD gets worse, your doctor may treat you with:  ? Medicines.  ? Oxygen.  ? Different ways to clear your airway, such as using a mask.    Follow these instructions at home:  ? Do not smoke.  ? Avoid tobacco smoke and other things that bother your lungs.  ? If given, take your antibiotic medicine as told. Finish the medicine even if you start to feel better.  ? Only take medicines as told by your doctor.  ? Drink enough fluids to keep your pee (urine) clear or pale yellow (unless your doctor has told you not to).  ? Use a cool mist machine (vaporizer).  ? If you use oxygen or a machine that turns liquid medicine into a mist (nebulizer), continue to use them as told.  ? Keep up with shots (vaccinations) as told by your doctor.  ? Exercise regularly.  ? Eat healthy foods.  ? Keep all doctor visits as told.  Get help right away if:  ? You are very short of breath and it gets worse.  ? You have trouble talking.  ? You have bad chest pain.  ? You have blood in your spit (sputum).  ? You have a fever.  ? You keep throwing up (vomiting).  ? You feel weak, or you pass out (faint).  ? You feel confused.  ? You keep getting worse.  This information is not intended to replace advice given to you by your health care provider. Make sure you discuss any questions you have with your health care provider.  Document Released: 10/24/2011 Document Revised: 04/11/2016 Document Reviewed: 07/09/2013  [employer]vier Interactive Patient Education ? 2017 [employer]

## 2016-12-11 NOTE — Discharge Summary (Signed)
DISCHARGE SUMMARY  Andrew [name]  MR#: [id number]  DOB:08-15-1941  Date of Admission: 12/07/2016 Date of Discharge: 12/11/2016  Attending Physician:Jefferie Holston T  Patient's DJ:[id number], MD  Consults:  None   Disposition: Discharge home  Follow-up Appts: Follow-up [place], MD Follow up in 1 week(s).   Specialty:  Family Medicine Contact information: [place] [address] [phone]        Noralee Space, MD Follow up in 10 day(s).   Specialty:  Pulmonary Disease Contact information: [place] [address] [phone]           Tests Needing Follow-up: -Routine re-evaluation of patient post acute COPD exacerbation including tapering of steroids   Discharge Diagnoses: COPD exacerbation with acute on chronic hypoxic and hypercarbic respiratory failure Pulmonary HTN HTN GERD BPH Dyslipidemia Severe protein calorie malnutrition  Initial presentation: 76 y.o.M with a history of severe COPD, chronic respiratory failure with hypoxia and hypercarbia, O2 dependence 2L [place] at baseline, BiPAP dependence QHS, pulmonary HTN, and essential HTN who presented with shortness of breath and was admitted for an acute COPD exacerbation.  Hospital Course:  COPD exacerbation with acute on chronic respiratory failure - prior to admit pt had recently finished seven-day Levaquin course, as well as oral steroids - Wheezing improved gradually with IV steroid use and has resolved at time of discharge - tapered to oral prednisone to continue long taper at discharge - Chose not to administer additional antibiotics during this admission - Stable on home daytime/nighttime oxygen regimen to include nightly BiPAP at time of discharge - Walking halls without difficulty with no wheezing on exam and patient anxious to be discharged home 1/24  Pulmonary HTN - Sildenafil to continue w/o change   HTN - BP well  controlled   GERD - cont usual home PPI  BPH - Flomax, Proscar cont per home regimen  Dyslipidemia - Statin per home dose   Severe protein calorie malnutrition - Continue supplement  Allergies as of 12/11/2016   No Known Allergies     Medication List    TAKE these medications   ADVAIR DISKUS 250-50 MCG/DOSE Aepb Generic drug:  Fluticasone-Salmeterol Inhale 1 puff into the lungs 2 (two) times daily.   albuterol 108 (90 Base) MCG/ACT inhaler Commonly known as:  PROVENTIL HFA;VENTOLIN HFA Inhale 2 puffs into the lungs every 6 (six) hours as needed for wheezing or shortness of breath.   CARTIA XT 240 MG 24 hr capsule Generic drug:  diltiazem Take 240 mg by mouth at bedtime.   finasteride 5 MG tablet Commonly known as:  PROSCAR Take 5 mg by mouth at bedtime.   ibuprofen 200 MG tablet Commonly known as:  ADVIL,MOTRIN Take 200 mg by mouth every 6 (six) hours as needed for moderate pain.   ipratropium-albuterol 0.5-2.5 (3) MG/3ML Soln Commonly known as:  DUONEB Take 3 mLs by nebulization every 6 (six) hours as needed. Dx: J44.9 What changed:  reasons to take this  additional instructions   liver oil-zinc oxide 40 % ointment Commonly known as:  DESITIN Apply 1 application topically See admin instructions. Apply to left butt cheek for wound care approximately every 4 hours while awake   multivitamin with minerals tablet Take 1 tablet by mouth daily.   omeprazole 40 MG capsule Commonly known as:  PRILOSEC Take 40 mg by mouth daily.   OVER THE COUNTER MEDICATION Apply 1 application topically See admin instructions. Protective ointment from home health care -  apply approximately every 4 hours to left butt cheek for wound care   OXYGEN Inhale 2-3 L into the lungs continuous. 2L daytime, 3L at night   oxymetazoline 0.05 % nasal spray Commonly known as:  AFRIN Place 1 spray into both nostrils daily as needed for congestion.   polyethylene glycol  packet Commonly known as:  MIRALAX / GLYCOLAX Take 17 g by mouth daily. What changed:  when to take this  additional instructions   predniSONE 10 MG tablet Commonly known as:  DELTASONE Take 4 tablets a day for 3 days, then 3 tablets a day for 3 days, then 2 tablets a day for 3 days, then take one tablet a day each day until you are seen in follow up by your Pulmonary MD What changed:  medication strength  additional instructions   RESTORIL 30 MG capsule Generic drug:  temazepam Take 30 mg by mouth at bedtime.   rosuvastatin 10 MG tablet Commonly known as:  CRESTOR Take 10 mg by mouth daily.   sildenafil 20 MG tablet Commonly known as:  REVATIO Take 1 tablet (20 mg total) by mouth 2 (two) times daily.   SPIRIVA HANDIHALER 18 MCG inhalation capsule Generic drug:  tiotropium INHALE THE CONTENTS OF 1 CAPSULE EVERY DAY What changed:  See the new instructions.   tamsulosin 0.4 MG Caps capsule Commonly known as:  FLOMAX Take 1 capsule (0.4 mg total) by mouth daily after breakfast. What changed:  when to take this   vitamin C 500 MG tablet Commonly known as:  ASCORBIC ACID Take 500 mg by mouth daily.   VITAMIN E PO Take 1 capsule by mouth daily.       Day of Discharge BP 125/69 (BP Location: Right Arm)   Pulse 66   Temp 97.9 F (36.6 C) (Oral)   Resp 20   Ht 5\' 7"  (1.702 m)   Wt 63 kg (138 lb 14.2 oz)   SpO2 94%   BMI 21.75 kg/m   Physical Exam: General: No acute respiratory distress Lungs: Clear to auscultation bilaterally without wheezes or crackles - distant bs t/o all fields  Cardiovascular: Regular rate and rhythm without murmur gallop or rub normal S1 and S2 Abdomen: Nontender, nondistended, soft, bowel sounds positive, no rebound, no ascites, no appreciable mass Extremities: No significant cyanosis, clubbing, or edema bilateral lower extremities  Basic Metabolic Panel:  Recent Labs Lab 12/07/16 1718 12/08/16 0623 12/09/16 0556  NA 138 136  137  K 4.5 3.9 4.3  CL 103 102 105  CO2 28 26 25   GLUCOSE 85 100* 152*  BUN 21* 16 27*  CREATININE 0.78 0.83 0.82  CALCIUM 8.7* 8.3* 9.1    Liver Function Tests:  Recent Labs Lab 12/07/16 1718  AST 15  ALT 24  ALKPHOS 72  BILITOT 0.5  PROT 6.6  ALBUMIN 4.1    CBC:  Recent Labs Lab 12/07/16 1718 12/08/16 0623 12/09/16 0556  WBC 17.6* 21.4* 12.0*  NEUTROABS 16.1*  --   --   HGB 12.4* 11.8* 10.9*  HCT 37.7* 35.6* 33.2*  MCV 89.1 89.0 86.9  PLT 269 260 236    Cardiac Enzymes:  Recent Labs Lab 12/07/16 1718  TROPONINI <0.03   BNP (last 3 results)  Recent Labs  01/02/16 1318 06/28/16 1828 12/07/16 1718  BNP 60.0 24.3 26.5    Recent Results (from the past 240 hour(s))  Blood culture (routine x 2)     Status: None (Preliminary result)   Collection Time:  12/07/16  5:18 PM  Result Value Ref Range Status   Specimen Description BLOOD RIGHT ANTECUBITAL  Final   Special Requests BOTTLES DRAWN AEROBIC AND ANAEROBIC 5 CC EACH  Final   Culture   Final    NO GROWTH 2 DAYS Performed at [facility], [address] [place], [place] [address]    Report Status PENDING  Incomplete  Blood culture (routine x 2)     Status: None (Preliminary result)   Collection Time: 12/07/16  5:18 PM  Result Value Ref Range Status   Specimen Description BLOOD BLOOD RIGHT FOREARM  Final   Special Requests BOTTLES DRAWN AEROBIC AND ANAEROBIC 5 CC EACH  Final   Culture   Final    NO GROWTH 2 DAYS Performed at [facility], [place] [address] [place], [place] [address]    Report Status PENDING  Incomplete     Time spent in discharge (includes decision making & examination of pt): >30 minutes  12/11/2016, 11:14 AM   Cherene Altes, MD [facility] Office  [phone] Pager [phone]  On-Call/Text Page:      [email or web address]      password [facility]

## 2016-12-11 NOTE — Care Management Important Message (Signed)
Important Message  Patient Details  Name: Andrew [name] MRN: [id number] Date of Birth: 1941-04-02   Medicare Important Message Given:  Yes    Kerin Salen 12/11/2016, 11:27 [place] Message  Patient Details  Name: Andrew [name] MRN: [id number] Date of Birth: 1941-06-27   Medicare Important Message Given:  Yes    Kerin Salen 12/11/2016, 11:27 AM

## 2016-12-13 LAB — CULTURE, BLOOD (ROUTINE X 2)
CULTURE: NO GROWTH
Culture: NO GROWTH

## 2016-12-16 ENCOUNTER — Telehealth: Payer: Self-pay | Admitting: Pulmonary Disease

## 2016-12-16 NOTE — Telephone Encounter (Signed)
SN  Please Advise-  I spoke with pt. And he wanted to know if you are willing to place an order for him to receive a hospital bed. He stated he wants this due to the fact that he has been in and out the hospital and due to him getting older he thinks this is a good idea. When asked did he contact his pcp, pt. Stated he wanted to check with you first to see if you were willing to write the order.  Last ov 11/25/16

## 2016-12-18 NOTE — Telephone Encounter (Signed)
Spoke with pt. And informed him of CY's message, he understood. No further questions were asked. Nothing further is needed

## 2016-12-18 NOTE — Telephone Encounter (Signed)
Per SN--  This will need to come from his PCP.  thanks

## 2016-12-20 DIAGNOSIS — Z0001 Encounter for general adult medical examination with abnormal findings: Secondary | ICD-10-CM | POA: Diagnosis not present

## 2016-12-20 DIAGNOSIS — K219 Gastro-esophageal reflux disease without esophagitis: Secondary | ICD-10-CM | POA: Diagnosis not present

## 2016-12-20 DIAGNOSIS — N400 Enlarged prostate without lower urinary tract symptoms: Secondary | ICD-10-CM | POA: Diagnosis not present

## 2016-12-20 DIAGNOSIS — G4700 Insomnia, unspecified: Secondary | ICD-10-CM | POA: Diagnosis not present

## 2016-12-20 DIAGNOSIS — I272 Pulmonary hypertension, unspecified: Secondary | ICD-10-CM | POA: Diagnosis not present

## 2016-12-20 DIAGNOSIS — E7800 Pure hypercholesterolemia, unspecified: Secondary | ICD-10-CM | POA: Diagnosis not present

## 2016-12-20 DIAGNOSIS — Z23 Encounter for immunization: Secondary | ICD-10-CM | POA: Diagnosis not present

## 2016-12-20 DIAGNOSIS — E041 Nontoxic single thyroid nodule: Secondary | ICD-10-CM | POA: Diagnosis not present

## 2016-12-20 DIAGNOSIS — J439 Emphysema, unspecified: Secondary | ICD-10-CM | POA: Diagnosis not present

## 2016-12-20 DIAGNOSIS — I10 Essential (primary) hypertension: Secondary | ICD-10-CM | POA: Diagnosis not present

## 2016-12-20 DIAGNOSIS — Z79899 Other long term (current) drug therapy: Secondary | ICD-10-CM | POA: Diagnosis not present

## 2016-12-20 DIAGNOSIS — J9611 Chronic respiratory failure with hypoxia: Secondary | ICD-10-CM | POA: Diagnosis not present

## 2016-12-23 ENCOUNTER — Ambulatory Visit (INDEPENDENT_AMBULATORY_CARE_PROVIDER_SITE_OTHER): Payer: Medicare Other | Admitting: Pulmonary Disease

## 2016-12-23 ENCOUNTER — Encounter: Payer: Self-pay | Admitting: Pulmonary Disease

## 2016-12-23 VITALS — BP 108/60 | HR 85 | Temp 97.0°F | Ht 67.0 in

## 2016-12-23 DIAGNOSIS — J9611 Chronic respiratory failure with hypoxia: Secondary | ICD-10-CM

## 2016-12-23 DIAGNOSIS — D649 Anemia, unspecified: Secondary | ICD-10-CM

## 2016-12-23 DIAGNOSIS — J439 Emphysema, unspecified: Secondary | ICD-10-CM

## 2016-12-23 DIAGNOSIS — I272 Pulmonary hypertension, unspecified: Secondary | ICD-10-CM

## 2016-12-23 MED ORDER — PREDNISONE 5 MG PO TABS: 5.0000 mg | ORAL_TABLET | Freq: Every day | ORAL | 1 refills | Status: DC

## 2016-12-23 NOTE — Patient Instructions (Signed)
Today we updated your med list in our EPIC system...    Continue your current medications the same...  We decided to finish out the Prednisone 10mg  one tab each AM til gone...    Then switch to the new PREDNISONE 5mg  tabs taking one tab each am til return visit...  Continue the OXYGEN, NEBULIZER 3 times daily, plus your [place]...  Slowly, gradually increase your activities as tolerated...  Call for any questions...  Let's plan a follow up visit in 6wks, sooner if needed for breathing problems.Marland KitchenMarland Kitchen

## 2016-12-23 NOTE — Progress Notes (Signed)
Subjective:     Patient ID: Andrew [name], male   DOB: October 17, 1941, 76 y.o.   MRN: [id number]  HPI 76 y/o WM, an ex-smoker quit in 1986, w/ severe bullous emphysema & hx of RUL bullous resection in 2007; Hx both hypercarbic & hypoxemic resp failure w/ cor pulmonale & secondary pulm HTN on Revatio x yrs; He has been stable for >10 yrs on the same pulm regimen as he has [place] living in [place], [place], and [place]...   ~  October 02, 2015:  Initial pulmonary evaluation by SN>  His PCP is Dr. Lujean [name], Andrew [name]...       Andrew [name] is a 76 y/o gentleman from [place]- moved here to [place] ~83mo ago to live w/ his daughter & son-in-law;  He has a long convoluted history & we have none of his prev objective data to review;  He tells me that he has known about COPD/Emphysema for >10 yrs and in 2007 he had right thoracotomy & "bleb-ectomy";  After this procedure he was placed on Oxygen at 2L/min and BiPAP to use at night, along w/ ADVAIR250Bid & SPIRIVA daily, plus REVATIO20Tid for pulmonary HTN;  He was also treated w/ NEBS for about 1-45yrs then this was discontinued;  He has pretty much been on this same regimen for the past 9 yrs w/o much change, despite or maybe because he has moved around a lot- [place] (surg done there in 2007), to [place], back to [place], on to [place] for 6 yrs, then back to [place] over the last yr or so...  He describes himself as being rock-solid stable on this exact regimen since 2007- he had Cath (?left & right heart) in 2007, told 1 blockage, good LVF ?right heart results, started on O2, BiPAP, and Revatio but he does not know why?  He notes min cough when supine & in early AM attributed to reflux; min if any sput production, no hemoptysis, he denies SOB but states DOE "if I over-exert" eg- walking, lifting/carrying, stairs, etc; he notes that ADLs are ok- no problem (he is stoic);  He denies CP, palpit, f/c/s, edema... He hasn't been  to an ER since 2007 he says & that was also the last time he had any Pred; he thinks he had CXR, PFT, 2DEcho all earlier this yr...   Smoking Hx>  He is an ex-smoker, started in his teens, smoked for 30 yrs up to 1ppd, quit in 1986; This is a 30 pack-yr hx, he does not recall ever being checked for A1AT defic...  Pulmonary Hx>  COPD/ Emphysema w/ right upper lobe "bleb-ectomy) 2007 in [place]; he has chronic hypoxemic resp failure on O2 at 2L/min since 2007;  He tells me that he was started on BiPAP about that same time but he doesn't know why- never had sleep study, not on CPAP prev, he does not know about pCO2 levels etc ("I like the fresh cool air");  His BiPAP came from [facility] in [place]- states he does not know the settings, machine never downloaded, etc;  He has also been on Revatio20Tid since 2007, apparently never tried on other meds, dose never adjusted- he knows about "pulmonary hypertension" but he doesn't know any details and it doesn't appear to have been followed up, and meds kept the same from doctor to doctor...   Medical Hx>  HBP, ?nonobstructive CAD, HL, thyroid nodule, GERD, constipation, BPH, insomnia...  Family Hx>  Father died w/  Emphysema & was a former smoker; no other hx lung dis in the family; Alpha-1 status is unknown...  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   Current Meds>  Oxygen 2L/min pulse-dose concentrator, Advair250Bid, Spiriva daily, Revatio20Tid, CardizemCD240, Crestor10, Nexium20, Proscar5, Restoril30...  EXAM shows Afeb, VSS, O2sat=93% on 2L/min pulse-dose;  Heent- neg, mallampati1;  Chest- decr BS at bases, can't augment BS voluntarily, w/o w/r/r;  Heart- RR w/o m/r/g;  Abd- soft, neg;  Ext- neg w/o c/c/e;  Neuro- intact...  CXR 10/02/15 showed norm heart size,  COPD, bullous emphysema/ hyperinflation, scarring right apex, NAD...   Spirometry 10/02/15 showed FVC=2.70 (69%), FEV1=1.12 (37%), %1sec=41, mid-flows reduced at 18% predicted; this is c/w severe airflow obstruction & GOLD Stage 3 COPD  Ambulatory oxygen saturation test 10/02/15> on O2 at 2L/min pulse-dose concentrator: O2sat=96% on 2L at rest w/ pulse=87; he walked 2 laps w/ his O2, stopped due to dyspnea, lowest O2sat=89% w/ pulse=113/min...  LAB 10/02/15>  Alpha-1-Antitrypsin level => pending (he never went to the lab for this blood test)  2DEchocardiogram 10/09/15 showed norm LVF w/ EF=55-60%, norm wall motion, mild MR, mild RA dil, PAsys est 93mmHg... Pt on Revatio 20Tid x 59yrs & I rec we wean slowly (Decr to Bid now)...    IMP >>     COPD/ bullous emphysema> s/p RUL "bleb-ectomy" in 2007, severe airflow obstruction w/ GOLD Stage3 COPD> on Advair250Bid & Spiriva daily; apparently he has no use for a rescue inhaler; prev on NEB w/ Albut but not for several yrs.     Chronic hypoxemic respiratory failure on O2 at 2L/min via pulse-dose concentrator...    Pt reports using BiPAP since 2007, never been on CPAP, never had sleep study he says, unknown ABGs or pCO2 data; machine from [facility] we will try to get the old data => none received.    Hx pulmonary hypertension on Revatio20Tid since 2007 w/o additional med trials or dose adjustments> we do not have any of the objective data from his prev physician teams... Current 2DEcho w/ PAsys est 41mmHg & we will wean the Revatio to Bid at this point => he does not want to wean further for "other" reasons...    Medical issues include:  HBP, ?nonobstructive CAD, HL, thyroid nodule, GERD, BPH, insomnia... PLAN >>     Andrew [name] has a distinctly patchy history to go along w/ his severe airflow obstruction & bullous emphysema;  We really need his old objective data from 2007 when he was started on O2 & BiPAP after his RUL bleb reduction surg;  He will try to get  names and numbers for [place]- in the meanwhile we will contact his last physician in [facility] for their more recent data as we establish out data base here in [place];  He is very concerned that he wants [place] to continue his current regimen which has served him well over the last 47yrs;  Continue Advair250Bid, Spiriva daily, Revatio20Tid=>Bid, O2 at 2L/min, and the BiPAP nightly as currently set... We plan ROV recheck in 1 month... NOTE> 2DEcho w/ PAsys est ~8mmHg & we will slowly wean Revatio...  ~  November 01, 2015:  64mo ROV & Andrew [name] reports stable, doing satis & notes no untoward effects from cutting the Revatio to 20mg Bid; he denies CP, palpit, incr SOB, edema,  etc; he continues on the O2 at 2L/min, BiPAP from [place], [place] once daily; we have not received any records from his mult physicians ([place], [place], [place])...    EXAM shows Afeb, VSS, O2sat=92% on 2L/min pulse-dose;  Heent- neg, mallampati1;  Chest- decr BS at bases, can't augment BS voluntarily, w/o w/r/r;  Heart- RR w/o m/r/g;  Abd- soft, neg;  Ext- neg w/o c/c/e;  Neuro- intact... IMP/PLAN>>  See prob list above- he is stable on this regimen but does not want to wean the Revatio further for "other" reasons; OK to continue current med regimen- advised regular exercise vs pulm rehab program; given ZPak for prn use over the winter & he knows to call for any resp issues, incr dyspnea, etc... He remains on BiPAP but we do not have any data- no records received from prev pulm physicians, no notes from [place], no download data from his machine- we will again try to make contact w/ his DME company... We plan ROV in 3-39mo.  ~  Mar 18, 2016:  4-37mo ROV & post hosp visit>  Andrew [name] has severe COPD/Emphysema, GOLD Stage 3 w/ FEV1=1.12L (37%predicted) in [id number],  Hx right thoracotomy & "bleb-ectomy" in 2007;  after this procedure he says he was placed on Oxygen at 2L/min and BiPAP to use at night, along w/ ADVAIR250Bid, SPIRIVA  daily, and REVATIO20Tid for pulmonary HTN (2DEcho here 11/16 showed PAsys est=74mmHg)-- we do not have any of that data from [place] (we have been unsuccessful in obtaining old data from any of his prev physicians); he has been resistent to any adjustment in his medication regimen for various reasons...     He's been [place] x2 since last OV> 1st [place] 2/9 - 01/03/16 by [facility] w/ CAP- CXR showed severe bullous emphysema, incr markings in RLL and atelectasis in R-mid lung, Temp 103, Lactate=2.4, WBC 16K; treated w/ O2, Solumedrol=>Pred, Zosyn/Vanco=>Levaquin, NEBs, etc; disch home w/ home health help- ?seen by his PCP after disch...    2nd [facility] 3/26 - 02/15/16 by [facility] w/ 1d hx incr SOB, wheezing, productive cough & felt to have a COPD exac; CXR showed his COPE/E, persistent incr markings in right base, WBC was elev at 20K, BNP=60, no pos cultures; he was treaed w/ O2, Solumedrol, Roceph/Zithro, NEBs, etc; he was disch on Levaquin & Pred; he developed urinary retention & foley placed (weaned off in NH); he was debilitated & sent to NH for rehab...     He was disch to [facility] for rehab & attended by [facility] note dated 03/01/16 reviewed-- finished Levaquin, weaned off the Pred, they were able to discontinue the foley & he passed voiding trial; disch home after 19d in rehab...     Now back home on same O2= 2L/min days & 3L/min night w/ BiPAP ?settings (he has been on this for yrs and never re-assessed), NEB w/ AlbutTid, Advair250Bid, Spiriva daily, Revatio20Bid (pt refuses to taper this med further);  He has developed pedal edema x3d & needs a low sodium diet + diuretic started but he tells me he is sched to see his PCP soon for this problem...    EXAM shows Afeb, VSS, O2sat=95% on 2L/min pulse-dose;  Heent- neg, mallampati1;  Chest- decr BS at bases, can't augment BS voluntarily, w/o w/r/r;  Heart- RR w/o m/r/g;  Abd- soft, neg;  Ext- neg w/o c/c/e;  Neuro- intact...  CXR 02/11/16 showed  hyperinflation, bullous emphysema, some scarring in right mid lung & both lower  lobes, no infiltrate, no edema...  LABS 01/2016> all reviewed in Epic... IMP/PLAN>>  Andrew [name] has had a protracted resp exac- triggered by prob RLL pneumonia (NOS) superimposed on his severe COPD/bullous emphysema;  Rec to change the Albut for Neb to [place] & continue treatments Tid; continue other meds regularly as outlined;  He is referred to [facility] & hopes to start soon;  We will plan ROV in 75mo sooner if needed.  ~  June 20, 2016:  65mo ROV w/ SN>  Andrew [name] returns for a 69mo ROV & states that he is doing very well- no new complaints or concerns at this time, his PCP is DrKoirala; He started [facility] in June & continues in this program at present; Pt prev inquired about treatments at "[facility]" for regenerative lung tissue (stem cell therapy) which costs ~$10K per treatment & all benefits are antecdotal; I offered to refer him to [facility] [place] [facility] if he wants cutting edge research approach...     EPIC records indicate ER visit 05/08/16 for Abd Pain> VSS, exam was neg, CT Abd showed large fecal burden otherw neg, distended urinary bladder, atherosclerosis, bilat L5 pars defects; Rec to take laxatives...     Severe COPD- GOLD Stage3, bullous emphysema, s/p resection of RUL bleb in 2007> on Advair250Bid & Spiriva daily; he has NEB w/ Duoneb for prn use; Spirometry 09/2015 w/ FEV1=1.12 (37%); he is enrolled in [place] & wants a POC instead of tanks;     Chronic hypoxemic respiratory failure on O2 at 2L/min via POC> ambulatory O2sats 09/2015 dropped to 89% on 2L/min after 2 Laps.    Hx prob hypercarbic resp failure inferred from Pt report of BiPAP ([facility]) since 2007, never been on CPAP, never had sleep study, unknown ABGs or pCO2 data>    Hx cor pulmonale/ pulmonary hypertension on Revatio20Tid since 2007 w/o additional med trials or dose adjustments> we do not have any of the objective data from his prev  physician teams; 2DEcho here 09/2015 showed PAsys=36 and we tried to wean his Revatio but he refused due to "other reasons", finally compromised on Revatio20Bid...    Hx CAP- Hosp 12/2015 w/ RLL opac (nos) & responded to broad spectrum Ab coverage + Sulomedrol, O2, Nebs, etc; readmitted 01/2016 w/ COPD exac- similar Rx but sent to NH for rehab at disch...    Cardiac issues>  ?nonobstructive CAD, cor pulmonale w/ mild pulmHTN & 2DEcho 09/2015 showing norm LVF w/ EF=55-60%, norm wall motion, mild MR, mild RA dil, PAsys est 8mmHg...    Medical issues include:  HBP (on CardizemCD240), HL (on Cres10), hx thyroid nodule, GERD (on Nexium40), Constipation, BPH (on Proscar5 & Flomax0.4) w/ hx urinary retention, insomnia (on Restoril30)... EXAM shows Afeb, VSS, O2sat=93% on 2L/min pulse-dose;  Heent- neg, mallampati1;  Chest- decr BS at bases, can't augment BS voluntarily, w/o w/r/r;  Heart- RR w/o m/r/g;  Abd- soft, neg;  Ext- neg w/o c/c/e;  Neuro- intact...  CXR 05/08/16>  Mod bullous emphysema w/ chr changes at the lung bases w/ pleuroparenchymal scarring, surg suture lines ove the right mid lung...  LABS 04/2016 in epic> Chems- ok, BS=149;  CBC- anemia w/ Hg= 10.4-11.7, WBC=15K IMP/PLAN>>  Andrew [name] is stable on his baseline regimen + pulm rehab, etc; rec to continue current meds and exercise; he needs the 2017 Flu vaccine when avail & will call prn any breathing problems; we plan ROV in 72mo...   ADDENDUM>>  Pt Hosp by [facility] 8/11 -  07/01/16 w/ CAP after presenting w/ incr SOB, decr O2sats at pulm rehab, & chills 9he lives w/ school aged grandkids)- CXR showed ?RLL opac, cultures neg & NOS- treated empirically w/ Rocephin & Zithromax, Solumedrol, O2, NEBS, etc; he improved gradually & disch home;  He went to see ENT 8/29 because he thought the pneumonia may have been from reflux & dysphagia- fiberoptic exam revealed some edema & pooling of secretions c/w LPR=> placed on PPI Bid and referred to GI;  Barium esophagram  showed a [place] & mod GERD but no esophagitis mass or stricture;  He saw DrSchooler & had EGD 08/23/16 showing a [place], patchy candida=> treated w/ Nystatin, later required Diflucan...   ~  November 25, 2016:  3mo ROV w/ SN>  Andrew [name] returns c/o 3d hx cough, lots of clear whitish sput, increased weakness and SOB "even on my oxygen"; symptoms started w/ a slight sore throat, hoarse, cough as noted but no f/c/s; he has been going to the Gym 5d per week to continue his exercise since graduating from the formal pulm rehab program (finished 07/2016) & is doing very well by his estimate (until this URI); he notes that 3d ago he had his usual 2H work out, went to [employer] ge[employer], then went to lunch & did all this w/o difficulty...      Severe COPD- GOLD Stage3, bullous emphysema, s/p resection of RUL bleb in 2007> on Advair250Bid & Spiriva daily; he has NEB w/ Duoneb for prn use; Spirometry 09/2015 w/ FEV1=1.12 (37%); he finished his PulmRehab 07/2016 & now exercises 5d/wk at the gym; he finally got his POC that he wanted...    Chronic hypoxemic respiratory failure on O2 at 2L/min via POC> ambulatory O2sats 09/2015 dropped to 89% on 2L/min after 2 Laps; repeat 11/2016 dropped to 85%after 1Lap...    Hx prob hypercarbic resp failure inferred from Pt report of BiPAP ([facility]) since 2007, never been on CPAP, never had sleep study, unknown ABGs or pCO2 data> we never received records from former physician teams or any of his old records...     Hx cor pulmonale/ pulmonary hypertension on Revatio20Tid since 2007 w/o additional med trials or dose adjustments> we do not have any of the objective data from his prev physician teams; 2DEcho here 09/2015 showed PAsys=36 and we tried to wean his Revatio but he refused due to "other reasons", finally compromised on Revatio20Bid...    Hx CAP- Hosp 12/2015 w/ RLL opac (nos) & responded to broad spectrum Ab coverage + Solumedrol, O2, Nebs, etc; readmitted 01/2016 w/ COPD exac- similar Rx but sent to  NH for rehab at disch...    Cardiac issues>  ?nonobstructive CAD, cor pulmonale w/ mild pulmHTN & 2DEcho 09/2015 showing norm LVF w/ EF=55-60%, norm wall motion, mild MR, mild RA dil, PAsys est 9mmHg...    Medical issues include:  HBP (on CardizemCD240), HL (on Cres10), hx thyroid nodule, GERD & prob LPR (on Nexium40), Constipation, BPH (on Proscar5 & Flomax0.4) w/ hx urinary retention, insomnia (on Restoril30)...       NOTE> pt is Iron deficient (see below) & needs to f/u w/ his PCP & GI for this...  EXAM shows Afeb, VSS, O2sat=92% on 3L/min;  Heent- neg, mallampati1;  Chest- decr BS at bases, can't augment BS voluntarily, w/o w/r/r;  Heart- RR w/o m/r/g;  Abd- soft, neg;  Ext- neg w/o c/c/e;  Neuro- intact...  CXR 06/28/16>  Hyperinflation, marked emphysematous changes, crowed lung markings at the bases, no consolidation or  effusions...  CXR 11/25/16>  COPD, bullous emphysema, and bilat pleuroparenchymal changes c/w scarring, no acute abnormality  Ambulatory O2sat on 3L/min pulse-dose POC>  O2sat=98% on 3L/min pulse-dose at rest w/ HR=90/min;  He ambulated only 1Lap (185') w/ O2sat drop to 85% w/ HR=108/min...  LABS 11/25/16>  Chems- wnl x BS=108;  A1c=6.1;  CBC- Hg=12.3, mcv=90, WBC=7.8K;  Fe=31 (6.6%sat)& Ferritin=8.7;  B12=661 IMP/PLAN>>  There is no such thing as a mild exac for Andrew [name]- even the mildest of URIs can cause a severe COPD exac- we discussed Rx w/ Levaquin500 x7d, Pred20mg - 5d taper (see AVS), + Diflucan100 per his request; hopefully he will respond but he has little in the way of reversible factors given his severe emphysema; reminded to go to the ER for [facility] admission if symptoms worsen despite therapy; as noted he is Fe defic & needs further eval per his PCP & GI- copies sent...   ~  December 23, 2016:  103mo ROV & post hospital check>  Andrew [name] was [facility] 1/20 - 12/11/16 by [facility] w/ a COPD exac w/ acute on chronic resp failure;  Pt already on Home O2 at 2L/min and BiPAP Qhs; he had  a root canal done, developed a sore throat, then cough, etc; Levaquin & Pred called -in for pt, Hosp for 4d w/ IV solumedrol & ultimately disch on Pred10... Currently c/o DOE w/ ADLs which is new to him as prev he was going to the gym etc...     He is currently taking Duoneb Q6H as needed + Advair250Bid, Spiriva via handihaler daily, Pred 10mg  tabs- slow taper... EXAM shows Afeb, VSS, O2sat=94% on 2L/min;  Heent- neg, mallampati1;  Chest- decr BS at bases, can't augment BS voluntarily, w/o w/r/r;  Heart- RR w/o m/r/g;  Abd- soft, neg;  Ext- neg w/o c/c/e;  Neuro- intact...  CXR 12/07/16 (independently reviewed by me in the PACS system)> norm heart size, calcif Ao, severe COPD w/ apical bullous changes & surg staples on right, NAD.Marland KitchenMarland Kitchen  EKG 12/07/16 showed NSR, rate 87, poor R prog V1-2, otherw WNL.Marland KitchenMarland Kitchen  LABS 11/2016> NOT retaining CO2 (ABG w/ pCO2=39;  Chems- ok;  CBC- ok w/ Hg=11-12 and wbc=21=>12;  BNP= wnl... IMP/PLAN>>  Andrew [name] had a COPD exac & required IV Solumedrol + in hosp attention w/ NEBS etc & now he is approaching his baseline; he called for an order for a Hosp bed but was referred to his PCP for this determination;  We reviewed the NEED for DUONEB Tid followed by Advair250Bid & Spiriva once daily; we will wean his Pred from 10mg /s to 5mg /d & plan ROV receheck in 6wks... NOTE: >50% of this 57min rov was spent in counseling & coordination of care...    Past Medical History:  Diagnosis Date  . BiPAP (biphasic positive airway pressure) dependence    Pt denies history of OSA  . BPH (benign prostatic hyperplasia)   . Cancer ([place])    skin  . COPD (chronic obstructive pulmonary disease) ([place])   . Dysphagia   . Emphysema lung ([place])   . GERD (gastroesophageal reflux disease)    " Silent reflux"  . Hearing loss    right ear  . HLD (hyperlipidemia)   . Hypertension   . Oxygen dependent    2-3 liters  . Oxygen dependent   . Pneumonia   . Pulmonary hypertension   Medical Hx>  HBP,  ?nonobstructive CAD, HL, thyroid nodule, GERD, constipation, BPH, insomnia... Meds include>  CardizemCD240, Crestor10, Nexium20, Miralax, Proscar10,  Restoril30...   Past Surgical History:  Procedure Laterality Date  . CARDIAC CATHETERIZATION    . CATARACT EXTRACTION W/ INTRAOCULAR LENS  IMPLANT, BILATERAL    . ESOPHAGOGASTRODUODENOSCOPY (EGD) WITH PROPOFOL N/A 08/23/2016   Procedure: ESOPHAGOGASTRODUODENOSCOPY (EGD) WITH PROPOFOL;  Surgeon: Wilford Corner, MD;  Location[facility] ENDOSCOPY;  Service: Endoscopy;  Laterality: N/A;  . LUNG SURGERY    . TONSILLECTOMY    Hx Thoracotomy & RUL Bleb-ectomy 2007 in [place], [place]...   Outpatient Encounter Prescriptions as of 12/23/2016  Medication Sig  . ADVAIR DISKUS 250-50 MCG/DOSE AEPB Inhale 1 puff into the lungs 2 (two) times daily.  Marland Kitchen albuterol (PROVENTIL HFA;VENTOLIN HFA) 108 (90 Base) MCG/ACT inhaler Inhale 2 puffs into the lungs every 6 (six) hours as needed for wheezing or shortness of breath.  . diltiazem (CARTIA XT) 240 MG 24 hr capsule Take 240 mg by mouth at bedtime.  . finasteride (PROSCAR) 5 MG tablet Take 5 mg by mouth at bedtime.   Marland Kitchen ibuprofen (ADVIL,MOTRIN) 200 MG tablet Take 200 mg by mouth every 6 (six) hours as needed for moderate pain.  Marland Kitchen ipratropium-albuterol (DUONEB) 0.5-2.5 (3) MG/3ML SOLN Take 3 mLs by nebulization every 6 (six) hours as needed. Dx: J44.9 (Patient taking differently: Take 3 mLs by nebulization every 6 (six) hours as needed (shortness of breath/ wheezing). Dx: J44.9)  . liver oil-zinc oxide (DESITIN) 40 % ointment Apply 1 application topically See admin instructions. Apply to left butt cheek for wound care approximately every 4 hours while awake  . Multiple Vitamins-Minerals (MULTIVITAMIN WITH MINERALS) tablet Take 1 tablet by mouth daily.  Marland Kitchen omeprazole (PRILOSEC) 40 MG capsule Take 40 mg by mouth daily.  Marland Kitchen OVER THE COUNTER MEDICATION Apply 1 application topically See admin instructions. Protective ointment from  home health care - apply approximately every 4 hours to left butt cheek for wound care  . OXYGEN Inhale 2-3 L into the lungs continuous. 2L daytime, 3L at night  . oxymetazoline (AFRIN) 0.05 % nasal spray Place 1 spray into both nostrils daily as needed for congestion.  . polyethylene glycol (MIRALAX / GLYCOLAX) packet Take 17 g by mouth daily. (Patient taking differently: Take 17 g by mouth at bedtime. Mix in 8 oz liquid and drink)  . rosuvastatin (CRESTOR) 10 MG tablet Take 10 mg by mouth daily.  . sildenafil (REVATIO) 20 MG tablet Take 1 tablet (20 mg total) by mouth 2 (two) times daily.  Marland Kitchen SPIRIVA HANDIHALER 18 MCG inhalation capsule INHALE THE CONTENTS OF 1 CAPSULE EVERY DAY (Patient taking differently: INHALE THE CONTENTS OF 1 CAPSULE EVERY DAY AT NOON)  . tamsulosin (FLOMAX) 0.4 MG CAPS capsule Take 1 capsule (0.4 mg total) by mouth daily after breakfast. (Patient taking differently: Take 0.4 mg by mouth at bedtime. )  . temazepam (RESTORIL) 30 MG capsule Take 30 mg by mouth at bedtime.  . vitamin C (ASCORBIC ACID) 500 MG tablet Take 500 mg by mouth daily.  Marland Kitchen VITAMIN E PO Take 1 capsule by mouth daily.  . predniSONE (DELTASONE) 10 MG tablet Take 4 tablets a day for 3 days, then 3 tablets a day for 3 days, then 2 tablets a day for 3 days, then take one tablet a day each day until you are seen in follow up by your Pulmonary MD   No facility-administered encounter medications on file as of 12/23/2016.     No Known Allergies   Current Medications, Allergies, Past Medical History, Past Surgical History, Family History, and Social History  were reviewed in [place] record.   Review of Systems             All symptoms NEG except where BOLDED >>  Constitutional:  F/C/S, fatigue, anorexia, unexpected weight change. HEENT:  HA, visual changes, hearing loss, earache, nasal symptoms, sore throat, mouth sores, hoarseness. Resp:  cough, sputum, hemoptysis; SOB, tightness,  wheezing. Cardio:  CP, palpit, DOE, orthopnea, edema. GI:  N/V/D/C, blood in stool; reflux, abd pain, distention, gas. GU:  dysuria, freq, urgency, hematuria, flank pain, voiding difficulty. MS:  joint pain, swelling, tenderness, decr ROM; neck pain, back pain, etc. Neuro:  HA, tremors, seizures, dizziness, syncope, weakness, numbness, gait abn. Skin:  suspicious lesions or skin rash. Heme:  adenopathy, bruising, bleeding. Psyche:  confusion, agitation, sleep disturbance, hallucinations, anxiety, depression suicidal.   Objective:   Physical Exam       Vital Signs:  Reviewed...   General:  WD, WN, 76 y/o WM in NAD; alert & oriented; pleasant & cooperative... HEENT:  [place]/AT; Conjunctiva- pink, Sclera- nonicteric, EOM-wnl, PERRLA, EACs-clear, TMs-wnl; NOSE-clear; THROAT-clear & wnl.  Neck:  Supple w/ fair ROM; no JVD; normal carotid impulses w/o bruits; no thyromegaly +small nodule palpated; no lymphadenopathy.  Chest:  Overinflated, resonant percussion note, decr BS bilat & can't augment BS voluntarily, no w/r/r heard... Heart:  Regular Rhythm; norm S1 & S2 without murmurs, rubs, or gallops detected. Abdomen:  Soft & nontender- no guarding or rebound; normal bowel sounds; no organomegaly or masses palpated. Ext:  decr ROM; without deformities or arthritic changes; no varicose veins, +venous insuffic, no edema;  Pulses intact w/o bruits. Neuro:  No focal neuro deficits; sensory testing normal; gait normal & balance OK. Derm:  No lesions noted; no rash etc. Lymph:  No cervical, supraclavicular, axillary, or inguinal adenopathy palpated.   Assessment:      IMP >>     Severe COPD- GOLD Stage3, bullous emphysema, s/p resection of RUL bleb in 2007> on Advair250Bid & Spiriva daily; he has NEB w/ Duoneb for prn use; Spirometry 09/2015 w/ FEV1=1.12 (37%); he has completed pulm rehab & now exercises at the gym 5d/wk;  He's had mult COPD exac => treat w/ Levaquin, Pred taper, continue Advair/  spiriva/ NEBS/ O2...    Chronic hypoxemic respiratory failure on O2 at 2L/min via POC> ambulatory O2sats 09/2015 dropped to 89% on 2L/min after 2 Laps; he dropped to 85% after 1Lap on 3L/min POC 11/2016...    Hx prob hypercarbic resp failure inferred from Pt report of BiPAP ([facility]) since 2007, never been on CPAP, never had sleep study, unknown ABGs or pCO2 data> we never received data from his prev physicians.    Hx cor pulmonale/ pulmonary hypertension on Revatio20Tid since 2007 w/o additional med trials or dose adjustments> we do not have any of the objective data from his prev physician teams; 2DEcho here 09/2015 showed PAsys=36 and we tried to wean his Revatio but he refused due to "other reasons", finally compromised on Revatio20Bid...    Hx CAP- Hosp 12/2015 w/ RLL opac (nos) & responded to broad spectrum Ab coverage + Sulomedrol, O2, Nebs, etc; readmitted 01/2016 w/ COPD exac- similar Rx but sent to NH for rehab at disch...    Cardiac issues>  ?nonobstructive CAD, cor pulmonale w/ mild pulmHTN & 2DEcho 09/2015 showing norm LVF w/ EF=55-60%, norm wall motion, mild MR, mild RA dil, PAsys est 9mmHg...    Medical issues include:  HBP (on CardizemCD240), HL (on Cres10), hx thyroid nodule,  GERD (on Nexium40), constipation, BPH (on Proscar5 & Flomax0.4) w/ hx urinary retention, insomnia (on Restoril30)...       NOTE>  He is Iron deficient & need further eval & Rx from his PCP & GI...  PLAN >>  10/02/15>   Andrew [name] has a distinctly patchy history to go along w/ his severe airflow obstruction & bullous emphysema;  We really need his old objective data from 2007 when he was started on O2 & BiPAP after his RUL bleb reduction surg;  He will try to get names and numbers for [place]- in the meanwhile we will contact his last physician in [facility] for their more recent data as we establish out data base here in [place];  He is very concerned that he wants [place] to continue his current regimen which has served him  well over the last 42yrs;  Continue Advair250Bid, Spiriva daily, Revatio20tid, O2 at 2L/min, and the BiPAP nightly as currently set...  11/01/15>  See prob list above- he is stable on this regimen but does not want to wean the Revatio further for "other" reasons; OK to continue current med regimen- advised regular exercise vs pulm rehab program; given ZPak for prn use over the winter & he knows to call for any resp issues, incr dyspnea, etc... He remains on BiPAP but we do not have any data- no records received from prev pulm physicians, no notes from [facility], no download data from his machine- we will again try to make contact w/ his DME company... 03/18/16>   Andrew [name] has had a protracted resp [place] x2 this spring- triggered by prob RLL pneumonia (NOS) superimposed on his severe COPD/bullous emphysema;  Rec to change the [place] for Neb to [place] & continue treatments Tid; continue other meds regularly as outlined;  He is referred to [facility] & hopes to start soon;  We will plan ROV in 74mo sooner if needed. 06/20/16>   Andrew [name] is stable on his baseline regimen + pulm rehab, etc; rec to continue current meds and exercise; he needs the 2017 Flu vaccine when avail & will call prn any breathing problems; we plan ROV in 22mo...  11/25/16>   There is no such thing as a mild exac for Andrew [name]- even the mildest of URIs can cause a severe COPD exac- we discussed Rx w/ Levaquin500 x7d, Pred20mg - 5d taper (see AVS), + Diflucan100 per his request; hopefully he will respond but he has little in the way of reversible factors given his severe emphysema; reminded to go to the ER for [facility] admission if symptoms worsen despite therapy; as noted he is Fe defic & needs further eval per his PCP & GI- copies sent. => subseq Hosp for 4d w/ IV Solumedrol, NEBs/ O2/ etc...     Plan:     Patient's Medications  New Prescriptions   PREDNISONE (DELTASONE) 5 MG TABLET    Take 1 tablet (5 mg total) by mouth daily with breakfast.  Previous  Medications   ADVAIR DISKUS 250-50 MCG/DOSE AEPB    Inhale 1 puff into the lungs 2 (two) times daily.   ALBUTEROL (PROVENTIL HFA;VENTOLIN HFA) 108 (90 BASE) MCG/ACT INHALER    Inhale 2 puffs into the lungs every 6 (six) hours as needed for wheezing or shortness of breath.   DILTIAZEM (CARTIA XT) 240 MG 24 HR CAPSULE    Take 240 mg by mouth at bedtime.   FINASTERIDE (PROSCAR) 5 MG TABLET    Take 5 mg by mouth at  bedtime.    IBUPROFEN (ADVIL,MOTRIN) 200 MG TABLET    Take 200 mg by mouth every 6 (six) hours as needed for moderate pain.   IPRATROPIUM-ALBUTEROL (DUONEB) 0.5-2.5 (3) MG/3ML SOLN    Take 3 mLs by nebulization every 6 (six) hours as needed. Dx: J44.9   LIVER OIL-ZINC OXIDE (DESITIN) 40 % OINTMENT    Apply 1 application topically See admin instructions. Apply to left butt cheek for wound care approximately every 4 hours while awake   MULTIPLE VITAMINS-MINERALS (MULTIVITAMIN WITH MINERALS) TABLET    Take 1 tablet by mouth daily.   OMEPRAZOLE (PRILOSEC) 40 MG CAPSULE    Take 40 mg by mouth daily.   OVER THE COUNTER MEDICATION    Apply 1 application topically See admin instructions. Protective ointment from home health care - apply approximately every 4 hours to left butt cheek for wound care   OXYGEN    Inhale 2-3 L into the lungs continuous. 2L daytime, 3L at night   OXYMETAZOLINE (AFRIN) 0.05 % NASAL SPRAY    Place 1 spray into both nostrils daily as needed for congestion.   POLYETHYLENE GLYCOL (MIRALAX / GLYCOLAX) PACKET    Take 17 g by mouth daily.   ROSUVASTATIN (CRESTOR) 10 MG TABLET    Take 10 mg by mouth daily.   SILDENAFIL (REVATIO) 20 MG TABLET    Take 1 tablet (20 mg total) by mouth 2 (two) times daily.   SPIRIVA HANDIHALER 18 MCG INHALATION CAPSULE    INHALE THE CONTENTS OF 1 CAPSULE EVERY DAY   TAMSULOSIN (FLOMAX) 0.4 MG CAPS CAPSULE    Take 1 capsule (0.4 mg total) by mouth daily after breakfast.   TEMAZEPAM (RESTORIL) 30 MG CAPSULE    Take 30 mg by mouth at bedtime.   VITAMIN C  (ASCORBIC ACID) 500 MG TABLET    Take 500 mg by mouth daily.   VITAMIN E PO    Take 1 capsule by mouth daily.  Modified Medications   No medications on file  Discontinued Medications   PREDNISONE (DELTASONE) 10 MG TABLET    Take 4 tablets a day for 3 days, then 3 tablets a day for 3 days, then 2 tablets a day for 3 days, then take one tablet a day each day until you are seen in follow up by your Pulmonary MD

## 2017-01-10 NOTE — Progress Notes (Signed)
[facility] PREP Weekly Session   Patient Details  Name: Andrew [name] MRN: [id number] Date of Birth: [birth year] Age: 76 y.o. PCP: Lujean Amel, MD  Vitals:   01/10/17 [id number]  Weight: 135 lb (61.2 kg)        [facility] Weekly seesion - 01/10/17 0900      Weekly Session   Topic Discussed Other ways to be active   Minutes exercised this week 90 minutes  strength   Classes attended to date 56   Comments this is the first class back in several weeks d/t illness & hospitalization     Fun things you did since last meeting:"ate out" Things you are grateful for:"life & health"  Vanita Ingles 01/10/2017, 9:54 AM

## 2017-01-15 ENCOUNTER — Telehealth: Payer: Self-pay | Admitting: Pulmonary Disease

## 2017-01-15 NOTE — Telephone Encounter (Signed)
Spoke with Missy at Dr Z[facility] Council office  She states that pt has dental infection and is needing abx prior to extraction  She is asking what SN would rec based on his medical hx  Please advise thanks! No Known Allergies Current Outpatient Prescriptions on File Prior to Visit  Medication Sig Dispense Refill  . ADVAIR DISKUS 250-50 MCG/DOSE AEPB Inhale 1 puff into the lungs 2 (two) times daily. 60 each 2  . albuterol (PROVENTIL HFA;VENTOLIN HFA) 108 (90 Base) MCG/ACT inhaler Inhale 2 puffs into the lungs every 6 (six) hours as needed for wheezing or shortness of breath. 1 Inhaler 2  . diltiazem (CARTIA XT) 240 MG 24 hr capsule Take 240 mg by mouth at bedtime.    . finasteride (PROSCAR) 5 MG tablet Take 5 mg by mouth at bedtime.     Marland Kitchen ibuprofen (ADVIL,MOTRIN) 200 MG tablet Take 200 mg by mouth every 6 (six) hours as needed for moderate pain.    Marland Kitchen ipratropium-albuterol (DUONEB) 0.5-2.5 (3) MG/3ML SOLN Take 3 mLs by nebulization every 6 (six) hours as needed. Dx: J44.9 (Patient taking differently: Take 3 mLs by nebulization every 6 (six) hours as needed (shortness of breath/ wheezing). Dx: J44.9) 360 mL 12  . liver oil-zinc oxide (DESITIN) 40 % ointment Apply 1 application topically See admin instructions. Apply to left butt cheek for wound care approximately every 4 hours while awake    . Multiple Vitamins-Minerals (MULTIVITAMIN WITH MINERALS) tablet Take 1 tablet by mouth daily.    Marland Kitchen omeprazole (PRILOSEC) 40 MG capsule Take 40 mg by mouth daily.    Marland Kitchen OVER THE COUNTER MEDICATION Apply 1 application topically See admin instructions. Protective ointment from home health care - apply approximately every 4 hours to left butt cheek for wound care    . OXYGEN Inhale 2-3 L into the lungs continuous. 2L daytime, 3L at night    . oxymetazoline (AFRIN) 0.05 % nasal spray Place 1 spray into both nostrils daily as needed for congestion.    . polyethylene glycol (MIRALAX / GLYCOLAX) packet Take 17 g by mouth daily.  (Patient taking differently: Take 17 g by mouth at bedtime. Mix in 8 oz liquid and drink) 14 each 1  . predniSONE (DELTASONE) 5 MG tablet Take 1 tablet (5 mg total) by mouth daily with breakfast. 50 tablet 1  . rosuvastatin (CRESTOR) 10 MG tablet Take 10 mg by mouth daily.    . sildenafil (REVATIO) 20 MG tablet Take 1 tablet (20 mg total) by mouth 2 (two) times daily. 180 tablet 3  . SPIRIVA HANDIHALER 18 MCG inhalation capsule INHALE THE CONTENTS OF 1 CAPSULE EVERY DAY (Patient taking differently: INHALE THE CONTENTS OF 1 CAPSULE EVERY DAY AT NOON) 90 capsule 2  . tamsulosin (FLOMAX) 0.4 MG CAPS capsule Take 1 capsule (0.4 mg total) by mouth daily after breakfast. (Patient taking differently: Take 0.4 mg by mouth at bedtime. ) 30 capsule 0  . temazepam (RESTORIL) 30 MG capsule Take 30 mg by mouth at bedtime.    . vitamin C (ASCORBIC ACID) 500 MG tablet Take 500 mg by mouth daily.    Marland Kitchen VITAMIN E PO Take 1 capsule by mouth daily.     No current facility-administered medications on file prior to visit.

## 2017-01-16 NOTE — Telephone Encounter (Signed)
Kennyth Lose at Dr [facility] office is calling to following up on the request for an antibiotic for the patient.

## 2017-01-16 NOTE — Telephone Encounter (Signed)
Per SN >> Augmentin 875mg  #14 1 po BID.

## 2017-01-16 NOTE — Telephone Encounter (Signed)
Spoke with Missy at [facility] She is aware of SN's recommendation. They will call in this prescription for the pt. Nothing further was needed.

## 2017-01-16 NOTE — Telephone Encounter (Signed)
Please advise Dr Lenna Gilford. Thanks.  Dr [facility] office aware that we will call back as soon as he responds.

## 2017-01-24 NOTE — Progress Notes (Signed)
[facility] PREP Weekly Session   Patient Details  Name: Andrew [name] MRN: [id number] Date of Birth: March 02, 1941 Age: 76 y.o. PCP: Lujean Amel, MD  Vitals:   01/24/17 1111  Weight: 136 lb (61.7 kg)        [facility] Weekly seesion - 01/24/17 1100      Weekly Session   Topic Discussed Other ways to be active;Restaurant Eating   Minutes exercised this week 210 minutes  30cardio/180strength   Classes attended to date 89     Things you are grateful for:"health"  Vanita Ingles 01/24/2017, 11:12 AM

## 2017-01-28 ENCOUNTER — Ambulatory Visit (INDEPENDENT_AMBULATORY_CARE_PROVIDER_SITE_OTHER): Payer: Medicare Other | Admitting: Pulmonary Disease

## 2017-01-28 VITALS — BP 98/60 | HR 69 | Temp 97.8°F | Ht 67.0 in | Wt 138.2 lb

## 2017-01-28 DIAGNOSIS — D649 Anemia, unspecified: Secondary | ICD-10-CM | POA: Diagnosis not present

## 2017-01-28 DIAGNOSIS — I272 Pulmonary hypertension, unspecified: Secondary | ICD-10-CM

## 2017-01-28 DIAGNOSIS — J9611 Chronic respiratory failure with hypoxia: Secondary | ICD-10-CM

## 2017-01-28 DIAGNOSIS — J432 Centrilobular emphysema: Secondary | ICD-10-CM | POA: Diagnosis not present

## 2017-01-28 NOTE — Patient Instructions (Signed)
Today we updated your med list in our EPIC system...    Continue your current medications the same...  We decided to keep the PREDNISONE 5mg  daily thru March, then decr to 5mg  every other day starting in April...  [facility]  Call for any questions...  Let's plan a follow up visit in 49mo, sooner if needed for problems.Marland KitchenMarland Kitchen

## 2017-01-29 ENCOUNTER — Encounter: Payer: Self-pay | Admitting: Pulmonary Disease

## 2017-01-29 NOTE — Progress Notes (Signed)
Subjective:     Patient ID: Andrew [name], male   DOB: October 17, 1941, 76 y.o.   MRN: [id number]  HPI 76 y/o WM, an ex-smoker quit in 1986, w/ severe bullous emphysema & hx of RUL bullous resection in 2007; Hx both hypercarbic & hypoxemic resp failure w/ cor pulmonale & secondary pulm HTN on Revatio x yrs; He has been stable for >10 yrs on the same pulm regimen as he has travelled [place] living in [place], [place], and [place]...   ~  October 02, 2015:  Initial pulmonary evaluation by SN>  His PCP is Dr. Lujean Amel, Kristen [name]...       Andrew [name] is a 76 y/o gentleman from [place]- moved here to [place] ~83mo ago to live w/ his daughter & son-in-law;  He has a long convoluted history & we have none of his prev objective data to review;  He tells me that he has known about COPD/Emphysema for >10 yrs and in 2007 he had right thoracotomy & "bleb-ectomy";  After this procedure he was placed on Oxygen at 2L/min and BiPAP to use at night, along w/ ADVAIR250Bid & SPIRIVA daily, plus REVATIO20Tid for pulmonary HTN;  He was also treated w/ NEBS for about 1-45yrs then this was discontinued;  He has pretty much been on this same regimen for the past 9 yrs w/o much change, despite or maybe because he has moved around a lot- [place] (surg done there in 2007), to [place], back to [place], on to [place] for 6 yrs, then back to [place] over the last yr or so...  He describes himself as being rock-solid stable on this exact regimen since 2007- he had Cath (?left & right heart) in 2007, told 1 blockage, good LVF ?right heart results, started on O2, BiPAP, and Revatio but he does not know why?  He notes min cough when supine & in early AM attributed to reflux; min if any sput production, no hemoptysis, he denies SOB but states DOE "if I over-exert" eg- walking, lifting/carrying, stairs, etc; he notes that ADLs are ok- no problem (he is stoic);  He denies CP, palpit, f/c/s, edema... He hasn't been  to an ER since 2007 he says & that was also the last time he had any Pred; he thinks he had CXR, PFT, 2DEcho all earlier this yr...   Smoking Hx>  He is an ex-smoker, started in his teens, smoked for 30 yrs up to 1ppd, quit in 1986; This is a 30 pack-yr hx, he does not recall ever being checked for A1AT defic...  Pulmonary Hx>  COPD/ Emphysema w/ right upper lobe "bleb-ectomy) 2007 in [place]; he has chronic hypoxemic resp failure on O2 at 2L/min since 2007;  He tells me that he was started on BiPAP about that same time but he doesn't know why- never had sleep study, not on CPAP prev, he does not know about pCO2 levels etc ("I like the fresh cool air");  His BiPAP came from [place] in [place]- states he does not know the settings, machine never downloaded, etc;  He has also been on Revatio20Tid since 2007, apparently never tried on other meds, dose never adjusted- he knows about "pulmonary hypertension" but he doesn't know any details and it doesn't appear to have been followed up, and meds kept the same from doctor to doctor...   Medical Hx>  HBP, ?nonobstructive CAD, HL, thyroid nodule, GERD, constipation, BPH, insomnia...  Family Hx>  Father died w/  Emphysema & was a former smoker; no other hx lung dis in the family; Alpha-1 status is unknown...  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   Current Meds>  Oxygen 2L/min pulse-dose concentrator, Advair250Bid, Spiriva daily, Revatio20Tid, CardizemCD240, Crestor10, Nexium20, Proscar5, Restoril30...  EXAM shows Afeb, VSS, O2sat=93% on 2L/min pulse-dose;  Heent- neg, mallampati1;  Chest- decr BS at bases, can't augment BS voluntarily, w/o w/r/r;  Heart- RR w/o m/r/g;  Abd- soft, neg;  Ext- neg w/o c/c/e;  Neuro- intact...  CXR 10/02/15 showed norm heart size,  COPD, bullous emphysema/ hyperinflation, scarring right apex, NAD...   Spirometry 10/02/15 showed FVC=2.70 (69%), FEV1=1.12 (37%), %1sec=41, mid-flows reduced at 18% predicted; this is c/w severe airflow obstruction & GOLD Stage 3 COPD  Ambulatory oxygen saturation test 10/02/15> on O2 at 2L/min pulse-dose concentrator: O2sat=96% on 2L at rest w/ pulse=87; he walked 2 laps w/ his O2, stopped due to dyspnea, lowest O2sat=89% w/ pulse=113/min...  LAB 10/02/15>  Alpha-1-Antitrypsin level => pending (he never went to the lab for this blood test)  2DEchocardiogram 10/09/15 showed norm LVF w/ EF=55-60%, norm wall motion, mild MR, mild RA dil, PAsys est 93mmHg... Pt on Revatio 20Tid x 78yrs & I rec we wean slowly (Decr to Bid now)...    IMP >>     COPD/ bullous emphysema> s/p RUL "bleb-ectomy" in 2007, severe airflow obstruction w/ GOLD Stage3 COPD> on Advair250Bid & Spiriva daily; apparently he has no use for a rescue inhaler; prev on NEB w/ Albut but not for several yrs.     Chronic hypoxemic respiratory failure on O2 at 2L/min via pulse-dose concentrator...    Pt reports using BiPAP since 2007, never been on CPAP, never had sleep study he says, unknown ABGs or pCO2 data; machine from [facility] we will try to get the old data => none received.    Hx pulmonary hypertension on Revatio20Tid since 2007 w/o additional med trials or dose adjustments> we do not have any of the objective data from his prev physician teams... Current 2DEcho w/ PAsys est 41mmHg & we will wean the Revatio to Bid at this point => he does not want to wean further for "other" reasons...    Medical issues include:  HBP, ?nonobstructive CAD, HL, thyroid nodule, GERD, BPH, insomnia... PLAN >>     Andrew [name] has a distinctly patchy history to go along w/ his severe airflow obstruction & bullous emphysema;  We really need his old objective data from 2007 when he was started on O2 & BiPAP after his RUL bleb reduction surg;  He will try to get  names and numbers for [place]- in the meanwhile we will contact his last physician in [facility] for their more recent data as we establish out data base here in [place];  He is very concerned that he wants [place] to continue his current regimen which has served him well over the last 47yrs;  Continue Advair250Bid, Spiriva daily, Revatio20Tid=>Bid, O2 at 2L/min, and the BiPAP nightly as currently set... We plan ROV recheck in 1 month... NOTE> 2DEcho w/ PAsys est ~8mmHg & we will slowly wean Revatio...  ~  November 01, 2015:  64mo ROV & Andrew [name] reports stable, doing satis & notes no untoward effects from cutting the Revatio to 20mg Bid; he denies CP, palpit, incr SOB, edema,  etc; he continues on the O2 at 2L/min, BiPAP from [place], [place] once daily; we have not received any records from his mult physicians ([place], [place], [place])...    EXAM shows Afeb, VSS, O2sat=92% on 2L/min pulse-dose;  Heent- neg, mallampati1;  Chest- decr BS at bases, can't augment BS voluntarily, w/o w/r/r;  Heart- RR w/o m/r/g;  Abd- soft, neg;  Ext- neg w/o c/c/e;  Neuro- intact... IMP/PLAN>>  See prob list above- he is stable on this regimen but does not want to wean the Revatio further for "other" reasons; OK to continue current med regimen- advised regular exercise vs pulm rehab program; given ZPak for prn use over the winter & he knows to call for any resp issues, incr dyspnea, etc... He remains on BiPAP but we do not have any data- no records received from prev pulm physicians, no notes from [place], no download data from his machine- we will again try to make contact w/ his DME company... We plan ROV in 3-39mo.  ~  Mar 18, 2016:  4-37mo ROV & post hosp visit>  Azan has severe COPD/Emphysema, GOLD Stage 3 w/ FEV1=1.12L (37%predicted) in [id number],  Hx right thoracotomy & "bleb-ectomy" in 2007;  after this procedure he says he was placed on Oxygen at 2L/min and BiPAP to use at night, along w/ ADVAIR250Bid, SPIRIVA  daily, and REVATIO20Tid for pulmonary HTN (2DEcho here 11/16 showed PAsys est=74mmHg)-- we do not have any of that data from [place] (we have been unsuccessful in obtaining old data from any of his prev physicians); he has been resistent to any adjustment in his medication regimen for various reasons...     He's been [place] x2 since last OV> 1st [place] 2/9 - 01/03/16 by [facility] w/ CAP- CXR showed severe bullous emphysema, incr markings in RLL and atelectasis in R-mid lung, Temp 103, Lactate=2.4, WBC 16K; treated w/ O2, Solumedrol=>Pred, Zosyn/Vanco=>Levaquin, NEBs, etc; disch home w/ home health help- ?seen by his PCP after disch...    2nd [facility] 3/26 - 02/15/16 by [facility] w/ 1d hx incr SOB, wheezing, productive cough & felt to have a COPD exac; CXR showed his COPE/E, persistent incr markings in right base, WBC was elev at 20K, BNP=60, no pos cultures; he was treaed w/ O2, Solumedrol, Roceph/Zithro, NEBs, etc; he was disch on Levaquin & Pred; he developed urinary retention & foley placed (weaned off in NH); he was debilitated & sent to NH for rehab...     He was disch to [facility] for rehab & attended by [facility] note dated 03/01/16 reviewed-- finished Levaquin, weaned off the Pred, they were able to discontinue the foley & he passed voiding trial; disch home after 19d in rehab...     Now back home on same O2= 2L/min days & 3L/min night w/ BiPAP ?settings (he has been on this for yrs and never re-assessed), NEB w/ AlbutTid, Advair250Bid, Spiriva daily, Revatio20Bid (pt refuses to taper this med further);  He has developed pedal edema x3d & needs a low sodium diet + diuretic started but he tells me he is sched to see his PCP soon for this problem...    EXAM shows Afeb, VSS, O2sat=95% on 2L/min pulse-dose;  Heent- neg, mallampati1;  Chest- decr BS at bases, can't augment BS voluntarily, w/o w/r/r;  Heart- RR w/o m/r/g;  Abd- soft, neg;  Ext- neg w/o c/c/e;  Neuro- intact...  CXR 02/11/16 showed  hyperinflation, bullous emphysema, some scarring in right mid lung & both lower  lobes, no infiltrate, no edema...  LABS 01/2016> all reviewed in Epic... IMP/PLAN>>  Aashish has had a protracted resp exac- triggered by prob RLL pneumonia (NOS) superimposed on his severe COPD/bullous emphysema;  Rec to change the Albut for Neb to [place] & continue treatments Tid; continue other meds regularly as outlined;  He is referred to [facility] & hopes to start soon;  We will plan ROV in 75mo sooner if needed.  ~  June 20, 2016:  65mo ROV w/ SN>  Andrew [name] returns for a 69mo ROV & states that he is doing very well- no new complaints or concerns at this time, his PCP is DrKoirala; He started [facility] in June & continues in this program at present; Pt prev inquired about treatments at "[facility]" for regenerative lung tissue (stem cell therapy) which costs ~$10K per treatment & all benefits are antecdotal; I offered to refer him to [facility] [place] [facility] if he wants cutting edge research approach...     EPIC records indicate ER visit 05/08/16 for Abd Pain> VSS, exam was neg, CT Abd showed large fecal burden otherw neg, distended urinary bladder, atherosclerosis, bilat L5 pars defects; Rec to take laxatives...     Severe COPD- GOLD Stage3, bullous emphysema, s/p resection of RUL bleb in 2007> on Advair250Bid & Spiriva daily; he has NEB w/ Duoneb for prn use; Spirometry 09/2015 w/ FEV1=1.12 (37%); he is enrolled in [place] & wants a POC instead of tanks;     Chronic hypoxemic respiratory failure on O2 at 2L/min via POC> ambulatory O2sats 09/2015 dropped to 89% on 2L/min after 2 Laps.    Hx prob hypercarbic resp failure inferred from Pt report of BiPAP ([facility]) since 2007, never been on CPAP, never had sleep study, unknown ABGs or pCO2 data>    Hx cor pulmonale/ pulmonary hypertension on Revatio20Tid since 2007 w/o additional med trials or dose adjustments> we do not have any of the objective data from his prev  physician teams; 2DEcho here 09/2015 showed PAsys=36 and we tried to wean his Revatio but he refused due to "other reasons", finally compromised on Revatio20Bid...    Hx CAP- Hosp 12/2015 w/ RLL opac (nos) & responded to broad spectrum Ab coverage + Sulomedrol, O2, Nebs, etc; readmitted 01/2016 w/ COPD exac- similar Rx but sent to NH for rehab at disch...    Cardiac issues>  ?nonobstructive CAD, cor pulmonale w/ mild pulmHTN & 2DEcho 09/2015 showing norm LVF w/ EF=55-60%, norm wall motion, mild MR, mild RA dil, PAsys est 8mmHg...    Medical issues include:  HBP (on CardizemCD240), HL (on Cres10), hx thyroid nodule, GERD (on Nexium40), Constipation, BPH (on Proscar5 & Flomax0.4) w/ hx urinary retention, insomnia (on Restoril30)... EXAM shows Afeb, VSS, O2sat=93% on 2L/min pulse-dose;  Heent- neg, mallampati1;  Chest- decr BS at bases, can't augment BS voluntarily, w/o w/r/r;  Heart- RR w/o m/r/g;  Abd- soft, neg;  Ext- neg w/o c/c/e;  Neuro- intact...  CXR 05/08/16>  Mod bullous emphysema w/ chr changes at the lung bases w/ pleuroparenchymal scarring, surg suture lines ove the right mid lung...  LABS 04/2016 in epic> Chems- ok, BS=149;  CBC- anemia w/ Hg= 10.4-11.7, WBC=15K IMP/PLAN>>  Andrew [name] is stable on his baseline regimen + pulm rehab, etc; rec to continue current meds and exercise; he needs the 2017 Flu vaccine when avail & will call prn any breathing problems; we plan ROV in 72mo...   ADDENDUM>>  Pt Hosp by [facility] 8/11 -  07/01/16 w/ CAP after presenting w/ incr SOB, decr O2sats at pulm rehab, & chills 9he lives w/ school aged grandkids)- CXR showed ?RLL opac, cultures neg & NOS- treated empirically w/ Rocephin & Zithromax, Solumedrol, O2, NEBS, etc; he improved gradually & disch home;  He went to see ENT 8/29 because he thought the pneumonia may have been from reflux & dysphagia- fiberoptic exam revealed some edema & pooling of secretions c/w LPR=> placed on PPI Bid and referred to GI;  Barium esophagram  showed a [place] & mod GERD but no esophagitis mass or stricture;  He saw DrSchooler & had EGD 08/23/16 showing a Yoe, patchy candida=> treated w/ Nystatin, later required Diflucan...   ADDENDUM>>  Pt had colonoscopy 08/23/16 by DrSchooler-- small HH, patchy candida, mild gastitis seen;  Treated w/ Nystatin, continue Prilosec40/d...   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      Severe COPD- GOLD Stage3, bullous emphysema, s/p resection of RUL bleb in 2007> on Advair250Bid & Spiriva daily; he has NEB w/ Duoneb for prn use; Spirometry 09/2015 w/ FEV1=1.12 (37%); he finished his PulmRehab 07/2016 & now exercises 5d/wk at the gym; he finally got his POC that he wanted...    Chronic hypoxemic respiratory failure on O2 at 2L/min via POC> ambulatory O2sats 09/2015 dropped to 89% on 2L/min after 2 Laps; repeat 11/2016 dropped to 85%after 1Lap...    Hx prob hypercarbic resp failure inferred from Pt report of BiPAP ([facility]) since 2007, never been on CPAP, never had sleep study, unknown ABGs or pCO2 data> we never received records from former physician teams or any of his old records...     Hx cor pulmonale/ pulmonary hypertension on Revatio20Tid since 2007 w/o additional med trials or dose adjustments> we do not have any of the objective data from his prev physician teams; 2DEcho here 09/2015 showed PAsys=36 and we tried to wean his Revatio but he refused due to "other reasons", finally compromised on Revatio20Bid...    Hx CAP-  Hosp 12/2015 w/ RLL opac (nos) & responded to broad spectrum Ab coverage + Solumedrol, O2, Nebs, etc; readmitted 01/2016 w/ COPD exac- similar Rx but sent to NH for rehab at disch...    Cardiac issues>  ?nonobstructive CAD, cor pulmonale w/ mild pulmHTN & 2DEcho 09/2015 showing norm LVF w/ EF=55-60%, norm wall motion, mild MR, mild RA dil, PAsys est 59mmHg...    Medical issues include:  HBP (on CardizemCD240), HL (on Cres10), hx thyroid nodule, GERD & prob LPR (on Nexium40), Constipation, BPH (on Proscar5 & Flomax0.4) w/ hx urinary retention, insomnia (on Restoril30)...       NOTE> pt is Iron deficient (see below) & needs to f/u w/ his PCP & GI for this...  EXAM shows Afeb, VSS, O2sat=92% on 3L/min;  Heent- neg, mallampati1;  Chest- decr BS at bases, can't augment BS voluntarily, w/o w/r/r;  Heart- RR w/o m/r/g;  Abd- soft, neg;  Ext-  neg w/o c/c/e;  Neuro- intact...  CXR 06/28/16>  Hyperinflation, marked emphysematous changes, crowed lung markings at the bases, no consolidation or effusions...  CXR 11/25/16>  COPD, bullous emphysema, and bilat pleuroparenchymal changes c/w scarring, no acute abnormality  Ambulatory O2sat on 3L/min pulse-dose POC>  O2sat=98% on 3L/min pulse-dose at rest w/ HR=90/min;  He ambulated only 1Lap (185') w/ O2sat drop to 85% w/ HR=108/min...  LABS 11/25/16>  Chems- wnl x BS=108;  A1c=6.1;  CBC- Hg=12.3, mcv=90, WBC=7.8K;  Fe=31 (6.6%sat)& Ferritin=8.7;  B12=661 IMP/PLAN>>  There is no such thing as a mild exac for Andrew [name]- even the mildest of URIs can cause a severe COPD exac- we discussed Rx w/ Levaquin500 x7d, Pred20mg - 5d taper (see AVS), + Diflucan100 per his request; hopefully he will respond but he has little in the way of reversible factors given his severe emphysema; reminded to go to the ER for [facility] admission if symptoms worsen despite therapy; as noted he is Fe defic & needs further eval per his PCP & GI- copies sent...  ~  December 23, 2016:  17mo ROV & post hospital  check>  Andrew [name] was [facility] 1/20 - 12/11/16 by [facility] w/ a COPD exac w/ acute on chronic resp failure;  Pt already on Home O2 at 2L/min and BiPAP Qhs; he had a root canal done, developed a sore throat, then cough, etc; Levaquin & Pred called -in for pt, Hosp for 4d w/ IV solumedrol & ultimately disch on Pred10... Currently c/o DOE w/ ADLs which is new to him as prev he was going to the gym etc...     He is currently taking Duoneb Q6H as needed + Advair250Bid, Spiriva via handihaler daily, Pred 10mg  tabs- slow taper... EXAM shows Afeb, VSS, O2sat=94% on 2L/min;  Heent- neg, mallampati1;  Chest- decr BS at bases, can't augment BS voluntarily, w/o w/r/r;  Heart- RR w/o m/r/g;  Abd- soft, neg;  Ext- neg w/o c/c/e;  Neuro- intact...  CXR 12/07/16 (independently reviewed by me in the PACS system)> norm heart size, calcif Ao, severe COPD w/ apical bullous changes & surg staples on right, NAD.Marland KitchenMarland Kitchen  EKG 12/07/16 showed NSR, rate 87, poor R prog V1-2, otherw WNL.Marland KitchenMarland Kitchen  LABS 11/2016> NOT retaining CO2 (ABG w/ pCO2=39;  Chems- ok;  CBC- ok w/ Hg=11-12 and wbc=21=>12;  BNP= wnl... IMP/PLAN>>  Andrew [name] had a COPD exac & required IV Solumedrol + in hosp attention w/ NEBS etc & now he is approaching his baseline; he called for an order for a Hosp bed but was referred to his PCP for this determination;  We reviewed the NEED for DUONEB Tid followed by Advair250Bid & Spiriva once daily; we will wean his Pred from 10mg /s to 5mg /d & plan ROV receheck in 6wks... NOTE: >50% of this 47min rov was spent in counseling & coordination of care...   ~  January 28, 2017:  6wk [place] is stable on meds + Pred 5mg /d;  He called 01/15/17 for antibiotics for an infected tooth- called in Augmentin & tooth has been extracted by dentist;  He remains on NEBs w/ Duoneb Tid, followed by Advair250Bid and Spiriva daily;  He reports using his Revatio "prn";  His breathing continues to improve, back in the gym 3d/wk, approaching his baseline he says;  Denies  cough, sput, hemoptysis, CP, etc... We reviewed his prob list as above...  EXAM shows Afeb, VSS, O2sat=96% on 2L/min;  Heent- neg, mallampati1;  Chest- decr BS at bases, can't augment BS voluntarily,  w/o w/r/r;  Heart- RR w/o m/r/g;  Abd- soft, neg;  Ext- neg w/o c/c/e;  Neuro- intact... IMP/PLAN>>  Andrew [name] continues to improve 7 move toward his baseline; Rec to continue Pred5mg /d thru march then decr to 5mg  Qod til return visit in 25mo...    Past Medical History:  Diagnosis Date  . BiPAP (biphasic positive airway pressure) dependence    Pt denies history of OSA  . BPH (benign prostatic hyperplasia)   . Cancer ([place])    skin  . COPD (chronic obstructive pulmonary disease) ([place])   . Dysphagia   . Emphysema lung ([place])   . GERD (gastroesophageal reflux disease)    " Silent reflux"  . Hearing loss    right ear  . HLD (hyperlipidemia)   . Hypertension   . Oxygen dependent    2-3 liters  . Oxygen dependent   . Pneumonia   . Pulmonary hypertension   Medical Hx>  HBP, ?nonobstructive CAD, HL, thyroid nodule, GERD, constipation, BPH, insomnia... Meds include>  CardizemCD240, Crestor10, Nexium20, Miralax, Proscar10, Restoril30...   Past Surgical History:  Procedure Laterality Date  . CARDIAC CATHETERIZATION    . CATARACT EXTRACTION W/ INTRAOCULAR LENS  IMPLANT, BILATERAL    . ESOPHAGOGASTRODUODENOSCOPY (EGD) WITH PROPOFOL N/A 08/23/2016   Procedure: ESOPHAGOGASTRODUODENOSCOPY (EGD) WITH PROPOFOL;  Surgeon: Wilford Corner, MD;  [facility] ENDOSCOPY;  Service: Endoscopy;  Laterality: N/A;  . LUNG SURGERY    . TONSILLECTOMY    Hx Thoracotomy & RUL Bleb-ectomy 2007 in [place], [place]...   Outpatient Encounter Prescriptions as of 12/23/2016  Medication Sig  . ADVAIR DISKUS 250-50 MCG/DOSE AEPB Inhale 1 puff into the lungs 2 (two) times daily.  Marland Kitchen albuterol (PROVENTIL HFA;VENTOLIN HFA) 108 (90 Base) MCG/ACT inhaler Inhale 2 puffs into the lungs every 6 (six) hours as needed for wheezing or  shortness of breath.  . diltiazem (CARTIA XT) 240 MG 24 hr capsule Take 240 mg by mouth at bedtime.  . finasteride (PROSCAR) 5 MG tablet Take 5 mg by mouth at bedtime.   Marland Kitchen ibuprofen (ADVIL,MOTRIN) 200 MG tablet Take 200 mg by mouth every 6 (six) hours as needed for moderate pain.  Marland Kitchen ipratropium-albuterol (DUONEB) 0.5-2.5 (3) MG/3ML SOLN Take 3 mLs by nebulization every 6 (six) hours as needed. Dx: J44.9 (Patient taking differently: Take 3 mLs by nebulization every 6 (six) hours as needed (shortness of breath/ wheezing). Dx: J44.9)  . liver oil-zinc oxide (DESITIN) 40 % ointment Apply 1 application topically See admin instructions. Apply to left butt cheek for wound care approximately every 4 hours while awake  . Multiple Vitamins-Minerals (MULTIVITAMIN WITH MINERALS) tablet Take 1 tablet by mouth daily.  Marland Kitchen omeprazole (PRILOSEC) 40 MG capsule Take 40 mg by mouth daily.  Marland Kitchen OVER THE COUNTER MEDICATION Apply 1 application topically See admin instructions. Protective ointment from home health care - apply approximately every 4 hours to left butt cheek for wound care  . OXYGEN Inhale 2-3 L into the lungs continuous. 2L daytime, 3L at night  . oxymetazoline (AFRIN) 0.05 % nasal spray Place 1 spray into both nostrils daily as needed for congestion.  . polyethylene glycol (MIRALAX / GLYCOLAX) packet Take 17 g by mouth daily. (Patient taking differently: Take 17 g by mouth at bedtime. Mix in 8 oz liquid and drink)  . rosuvastatin (CRESTOR) 10 MG tablet Take 10 mg by mouth daily.  . sildenafil (REVATIO) 20 MG tablet Take 1 tablet (20 mg total) by mouth 2 (two) times daily.  Marland Kitchen SPIRIVA  HANDIHALER 18 MCG inhalation capsule INHALE THE CONTENTS OF 1 CAPSULE EVERY DAY (Patient taking differently: INHALE THE CONTENTS OF 1 CAPSULE EVERY DAY AT NOON)  . tamsulosin (FLOMAX) 0.4 MG CAPS capsule Take 1 capsule (0.4 mg total) by mouth daily after breakfast. (Patient taking differently: Take 0.4 mg by mouth at bedtime. )  .  temazepam (RESTORIL) 30 MG capsule Take 30 mg by mouth at bedtime.  . vitamin C (ASCORBIC ACID) 500 MG tablet Take 500 mg by mouth daily.  Marland Kitchen VITAMIN E PO Take 1 capsule by mouth daily.  . predniSONE (DELTASONE) 10 MG tablet Take 4 tablets a day for 3 days, then 3 tablets a day for 3 days, then 2 tablets a day for 3 days, then take one tablet a day each day until you are seen in follow up by your Pulmonary MD   No facility-administered encounter medications on file as of 12/23/2016.     No Known Allergies   Current Medications, Allergies, Past Medical History, Past Surgical History, Family History, and Social History were reviewed in [employer] record.   Review of Systems             All symptoms NEG except where BOLDED >>  Constitutional:  F/C/S, fatigue, anorexia, unexpected weight change. HEENT:  HA, visual changes, hearing loss, earache, nasal symptoms, sore throat, mouth sores, hoarseness. Resp:  cough, sputum, hemoptysis; SOB, tightness, wheezing. Cardio:  CP, palpit, DOE, orthopnea, edema. GI:  N/V/D/C, blood in stool; reflux, abd pain, distention, gas. GU:  dysuria, freq, urgency, hematuria, flank pain, voiding difficulty. MS:  joint pain, swelling, tenderness, decr ROM; neck pain, back pain, etc. Neuro:  HA, tremors, seizures, dizziness, syncope, weakness, numbness, gait abn. Skin:  suspicious lesions or skin rash. Heme:  adenopathy, bruising, bleeding. Psyche:  confusion, agitation, sleep disturbance, hallucinations, anxiety, depression suicidal.   Objective:   Physical Exam       Vital Signs:  Reviewed...   General:  WD, WN, 76 y/o WM in NAD; alert & oriented; pleasant & cooperative... HEENT:  [place]/AT; Conjunctiva- pink, Sclera- nonicteric, EOM-wnl, PERRLA, EACs-clear, TMs-wnl; NOSE-clear; THROAT-clear & wnl.  Neck:  Supple w/ fair ROM; no JVD; normal carotid impulses w/o bruits; no thyromegaly +small nodule palpated; no lymphadenopathy.  Chest:   Overinflated, resonant percussion note, decr BS bilat & can't augment BS voluntarily, no w/r/r heard... Heart:  Regular Rhythm; norm S1 & S2 without murmurs, rubs, or gallops detected. Abdomen:  Soft & nontender- no guarding or rebound; normal bowel sounds; no organomegaly or masses palpated. Ext:  decr ROM; without deformities or arthritic changes; no varicose veins, +venous insuffic, no edema;  Pulses intact w/o bruits. Neuro:  No focal neuro deficits; sensory testing normal; gait normal & balance OK. Derm:  No lesions noted; no rash etc. Lymph:  No cervical, supraclavicular, axillary, or inguinal adenopathy palpated.   Assessment:      IMP >>     Severe COPD- GOLD Stage3, bullous emphysema, s/p resection of RUL bleb in 2007> on Advair250Bid & Spiriva daily; he has NEB w/ Duoneb for prn use; Spirometry 09/2015 w/ FEV1=1.12 (37%); he has completed pulm rehab & now exercises at the gym 5d/wk;  He's had mult COPD exac => treat w/ Levaquin, Pred taper, continue Advair/ spiriva/ NEBS/ O2...    Chronic hypoxemic respiratory failure on O2 at 2L/min via POC> ambulatory O2sats 09/2015 dropped to 89% on 2L/min after 2 Laps; he dropped to 85% after 1Lap on 3L/min POC 11/2016.Marland KitchenMarland Kitchen  Hx prob hypercarbic resp failure inferred from Pt report of BiPAP ([facility]) since 2007, never been on CPAP, never had sleep study, unknown ABGs or pCO2 data> we never received data from his prev physicians.    Hx cor pulmonale/ pulmonary hypertension on Revatio20Tid since 2007 w/o additional med trials or dose adjustments> we do not have any of the objective data from his prev physician teams; 2DEcho here 09/2015 showed PAsys=36 and we tried to wean his Revatio but he refused due to "other reasons", finally compromised on Revatio20Bid...    Hx CAP- Hosp 12/2015 w/ RLL opac (nos) & responded to broad spectrum Ab coverage + Sulomedrol, O2, Nebs, etc; readmitted 01/2016 w/ COPD exac- similar Rx but sent to NH for rehab at disch...     Cardiac issues>  ?nonobstructive CAD, cor pulmonale w/ mild pulmHTN & 2DEcho 09/2015 showing norm LVF w/ EF=55-60%, norm wall motion, mild MR, mild RA dil, PAsys est 76mmHg...    Medical issues include:  HBP (on CardizemCD240), HL (on Cres10), hx thyroid nodule, GERD (on Nexium40), constipation, BPH (on Proscar5 & Flomax0.4) w/ hx urinary retention, insomnia (on Restoril30)...       NOTE>  He is Iron deficient & need further eval & Rx from his PCP & GI...  PLAN >>  10/02/15>   Shayde has a distinctly patchy history to go along w/ his severe airflow obstruction & bullous emphysema;  We really need his old objective data from 2007 when he was started on O2 & BiPAP after his RUL bleb reduction surg;  He will try to get names and numbers for [place]- in the meanwhile we will contact his last physician in [facility] for their more recent data as we establish out data base here in [place];  He is very concerned that he wants [place] to continue his current regimen which has served him well over the last 28yrs;  Continue Advair250Bid, Spiriva daily, Revatio20tid, O2 at 2L/min, and the BiPAP nightly as currently set...  11/01/15>  See prob list above- he is stable on this regimen but does not want to wean the Revatio further for "other" reasons; OK to continue current med regimen- advised regular exercise vs pulm rehab program; given ZPak for prn use over the winter & he knows to call for any resp issues, incr dyspnea, etc... He remains on BiPAP but we do not have any data- no records received from prev pulm physicians, no notes from [place], no download data from his machine- we will again try to make contact w/ his DME company... 03/18/16>   Roshon has had a protracted resp [place] x2 this spring- triggered by prob RLL pneumonia (NOS) superimposed on his severe COPD/bullous emphysema;  Rec to change the [place] for Neb to [place] & continue treatments Tid; continue other meds regularly as outlined;  He is referred to [facility] & hopes to start soon;  We will plan ROV in 60mo sooner if needed. 06/20/16>   Andrew [name] is stable on his baseline regimen + pulm rehab, etc; rec to continue current meds and exercise; he needs the 2017 Flu vaccine when avail & will call prn any breathing problems; we plan ROV in 24mo...  11/25/16>   There is no such thing as a mild exac for Andrew [name]- even the mildest of URIs can cause a severe COPD exac- we discussed Rx w/ Levaquin500 x7d, Pred20mg - 5d taper (see AVS), + Diflucan100 per his request; hopefully he will respond but he has little in  the way of reversible factors given his severe emphysema; reminded to go to the ER for [facility] admission if symptoms worsen despite therapy; as noted he is Fe defic & needs further eval per his PCP & GI- copies sent. => subseq Hosp for 4d w/ IV Solumedrol, NEBs/ O2/ etc...     Plan:     Patient's Medications  New Prescriptions   No medications on file  Previous Medications   ADVAIR DISKUS 250-50 MCG/DOSE AEPB    Inhale 1 puff into the lungs 2 (two) times daily.   ALBUTEROL (PROVENTIL HFA;VENTOLIN HFA) 108 (90 BASE) MCG/ACT INHALER    Inhale 2 puffs into the lungs every 6 (six) hours as needed for wheezing or shortness of breath.   DILTIAZEM (CARTIA XT) 240 MG 24 HR CAPSULE    Take 240 mg by mouth at bedtime.   FINASTERIDE (PROSCAR) 5 MG TABLET    Take 5 mg by mouth at bedtime.    IBUPROFEN (ADVIL,MOTRIN) 200 MG TABLET    Take 200 mg by mouth every 6 (six) hours as needed for moderate pain.   IPRATROPIUM-ALBUTEROL (DUONEB) 0.5-2.5 (3) MG/3ML SOLN    Take 3 mLs by nebulization every 6 (six) hours as needed. Dx: J44.9   MULTIPLE VITAMINS-MINERALS (MULTIVITAMIN WITH MINERALS) TABLET    Take 1 tablet by mouth daily.   OMEPRAZOLE (PRILOSEC) 40 MG CAPSULE    Take 40 mg by mouth daily.   OVER THE COUNTER MEDICATION    Apply 1 application topically See admin instructions. Protective ointment from home health care - apply approximately every 4 hours to left butt cheek for  wound care   OXYGEN    Inhale 2-3 L into the lungs continuous. 2L daytime, 3L at night   POLYETHYLENE GLYCOL (MIRALAX / GLYCOLAX) PACKET    Take 17 g by mouth daily.   PREDNISONE (DELTASONE) 5 MG TABLET    Take 1 tablet (5 mg total) by mouth daily with breakfast.   ROSUVASTATIN (CRESTOR) 10 MG TABLET    Take 10 mg by mouth daily.   SILDENAFIL (REVATIO) 20 MG TABLET    Take 1 tablet (20 mg total) by mouth 2 (two) times daily.   SPIRIVA HANDIHALER 18 MCG INHALATION CAPSULE    INHALE THE CONTENTS OF 1 CAPSULE EVERY DAY   TAMSULOSIN (FLOMAX) 0.4 MG CAPS CAPSULE    Take 1 capsule (0.4 mg total) by mouth daily after breakfast.   TEMAZEPAM (RESTORIL) 30 MG CAPSULE    Take 30 mg by mouth at bedtime.   VITAMIN C (ASCORBIC ACID) 500 MG TABLET    Take 500 mg by mouth daily.   VITAMIN E PO    Take 1 capsule by mouth daily.  Modified Medications   No medications on file  Discontinued Medications   LIVER OIL-ZINC OXIDE (DESITIN) 40 % OINTMENT    Apply 1 application topically See admin instructions. Apply to left butt cheek for wound care approximately every 4 hours while awake   OXYMETAZOLINE (AFRIN) 0.05 % NASAL SPRAY    Place 1 spray into both nostrils daily as needed for congestion.

## 2017-02-17 NOTE — Progress Notes (Signed)
[facility] Report   Patient Details  Name: Andrew [name] MRN: [id number] Date of Birth: 12-17-1940 Age: 76 y.o. PCP: Lujean Amel, MD  There were no vitals filed for this visit.       [facility] Eval - 02/17/17 1100      Referral    Referring Provider pulmonary rehab   Reason for referral Other  copd   Program Start Date 08/16/16     Measurement   Neck measurement 15 Inches   Waist Circumference 35 inches   Body fat 26.2 percent     Information for Trainer   Goals States his endurance has increased which was a goal when he began.   Current Exercise 3 x's a week/mainly strength triaing   Orthopedic Concerns none   Medications that affect exercise Oxgen  wears 2L/aat     Mobility and Daily Activities   I find it easy to walk up or down two or more flights of stairs. 1   I have no trouble taking out the trash. 4   I do housework such as vacuuming and dusting on my own without difficulty. 4   I can easily lift a gallon of milk (8lbs). 4   I can easily walk a mile. 1   I have no trouble reaching into high cupboards or reaching down to pick up something from the floor. 4   I do not have trouble doing out-door work such as Armed forces logistics/support/administrative officer, raking leaves, or gardening. 3     Mobility and Daily Activities   I feel younger than my age. 4   I feel independent. 4   I feel energetic. 3   I live an active life.  3   I feel strong. 3   I feel healthy. 3   I feel active as other people my age. 3     How fit and strong are you.   Fit and Strong Total Score 44     Past Medical History:  Diagnosis Date  . BiPAP (biphasic positive airway pressure) dependence    Pt denies history of OSA  . BPH (benign prostatic hyperplasia)   . Cancer ([place])    skin  . COPD (chronic obstructive pulmonary disease) ([place])   . Dysphagia   . Emphysema lung ([place])   . GERD (gastroesophageal reflux disease)    " Silent reflux"  . Hearing loss    right ear  . HLD  (hyperlipidemia)   . Hypertension   . Oxygen dependent    2-3 liters  . Oxygen dependent   . Pneumonia   . Pulmonary hypertension    Past Surgical History:  Procedure Laterality Date  . CARDIAC CATHETERIZATION    . CATARACT EXTRACTION W/ INTRAOCULAR LENS  IMPLANT, BILATERAL    . ESOPHAGOGASTRODUODENOSCOPY (EGD) WITH PROPOFOL N/A 08/23/2016   Procedure: ESOPHAGOGASTRODUODENOSCOPY (EGD) WITH PROPOFOL;  Surgeon: Wilford Corner, MD;  Location: [facility] ENDOSCOPY;  Service: Endoscopy;  Laterality: N/A;  . LUNG SURGERY    . TONSILLECTOMY     History  Smoking Status  . Former Smoker  . Packs/day: 0.00  . Years: 0.00  . Types: Cigarettes  . Quit date: 10/01/1984  Smokeless Tobacco  . Never Used    Andrew Bond "Gene" has been a pleasure to work with.  He states he continues to come and do his strength training 3 x's a week for about 1.5 hrs each time.  He states he feels stronger and plans to continue his exercise plan  for as long as he can.  He states he will also drop in to the weekly PREP class from time to time. Gene was one of the few people in the program that didn't have weight to lose, in fact, he needed to gain weight which is what he did.   Andrew [name] 02/17/2017, 11:31 AM

## 2017-02-24 ENCOUNTER — Telehealth: Payer: Self-pay | Admitting: Pulmonary Disease

## 2017-02-24 NOTE — Telephone Encounter (Signed)
Spoke with patient-this is mainly a FYI for SN; however, if changes are needed then patient would like a call back. Otherwise we can close message once SN has noted.

## 2017-02-28 NOTE — Progress Notes (Signed)
[facility] PREP Weekly Session   Patient Details  Name: Andrew [name] MRN: [id number] Date of Birth: 12-27-1940 Age: 76 y.o. PCP: Lujean Amel, MD  Vitals:   02/28/17 1204  Weight: 140 lb (63.5 kg)        [facility] Weekly seesion - 02/28/17 1200      Weekly Session   Topic Discussed Finding support   Minutes exercised this week --  cardio"some"/strength"a lot"/flexibility"fair"     Fun things you did since last meeting:"avoided the flu" Things you are grateful for:"gaining weight"  Vanita Ingles 02/28/2017, 12:08 PM

## 2017-03-04 ENCOUNTER — Telehealth: Payer: Self-pay | Admitting: Pulmonary Disease

## 2017-03-04 NOTE — Telephone Encounter (Signed)
Pt called in and stated he is still having lots of nasal congestions and he has been using the Afrin nasal spray but it is not helping. Asked pt was he having trouble with his breathing, he denied any breathing issues just not able to breath through his nose. Offered for the pt to reach out to his PCP and see what they suggests. He states he will. Nothing further is needed.

## 2017-03-06 DIAGNOSIS — R0981 Nasal congestion: Secondary | ICD-10-CM | POA: Diagnosis not present

## 2017-03-27 DIAGNOSIS — D0362 Melanoma in situ of left upper limb, including shoulder: Secondary | ICD-10-CM | POA: Diagnosis not present

## 2017-03-27 DIAGNOSIS — D1801 Hemangioma of skin and subcutaneous tissue: Secondary | ICD-10-CM | POA: Diagnosis not present

## 2017-03-27 DIAGNOSIS — D482 Neoplasm of uncertain behavior of peripheral nerves and autonomic nervous system: Secondary | ICD-10-CM | POA: Diagnosis not present

## 2017-03-27 DIAGNOSIS — Z85828 Personal history of other malignant neoplasm of skin: Secondary | ICD-10-CM | POA: Diagnosis not present

## 2017-03-27 DIAGNOSIS — L821 Other seborrheic keratosis: Secondary | ICD-10-CM | POA: Diagnosis not present

## 2017-03-27 DIAGNOSIS — L814 Other melanin hyperpigmentation: Secondary | ICD-10-CM | POA: Diagnosis not present

## 2017-03-27 DIAGNOSIS — L570 Actinic keratosis: Secondary | ICD-10-CM | POA: Diagnosis not present

## 2017-03-27 DIAGNOSIS — M5412 Radiculopathy, cervical region: Secondary | ICD-10-CM | POA: Diagnosis not present

## 2017-04-01 ENCOUNTER — Other Ambulatory Visit: Payer: Self-pay | Admitting: Pulmonary Disease

## 2017-04-01 DIAGNOSIS — J432 Centrilobular emphysema: Secondary | ICD-10-CM

## 2017-04-01 DIAGNOSIS — J441 Chronic obstructive pulmonary disease with (acute) exacerbation: Secondary | ICD-10-CM

## 2017-04-01 DIAGNOSIS — G4733 Obstructive sleep apnea (adult) (pediatric): Secondary | ICD-10-CM

## 2017-04-01 DIAGNOSIS — J9611 Chronic respiratory failure with hypoxia: Secondary | ICD-10-CM

## 2017-04-01 DIAGNOSIS — I272 Pulmonary hypertension, unspecified: Secondary | ICD-10-CM

## 2017-04-03 DIAGNOSIS — L905 Scar conditions and fibrosis of skin: Secondary | ICD-10-CM | POA: Diagnosis not present

## 2017-04-03 DIAGNOSIS — D0359 Melanoma in situ of other part of trunk: Secondary | ICD-10-CM | POA: Diagnosis not present

## 2017-04-17 ENCOUNTER — Telehealth: Payer: Self-pay | Admitting: Pulmonary Disease

## 2017-04-17 MED ORDER — ALBUTEROL SULFATE HFA 108 (90 BASE) MCG/ACT IN AERS: 2.0000 | INHALATION_SPRAY | Freq: Four times a day (QID) | RESPIRATORY_TRACT | 11 refills | Status: DC | PRN

## 2017-04-17 NOTE — Telephone Encounter (Signed)
Refill has been sent to the pharmacy per pts request.  Nothing further is needed.  

## 2017-04-22 DIAGNOSIS — N401 Enlarged prostate with lower urinary tract symptoms: Secondary | ICD-10-CM | POA: Diagnosis not present

## 2017-04-22 DIAGNOSIS — R3912 Poor urinary stream: Secondary | ICD-10-CM | POA: Diagnosis not present

## 2017-05-01 ENCOUNTER — Ambulatory Visit (INDEPENDENT_AMBULATORY_CARE_PROVIDER_SITE_OTHER): Payer: Medicare Other | Admitting: Pulmonary Disease

## 2017-05-01 ENCOUNTER — Encounter: Payer: Self-pay | Admitting: Pulmonary Disease

## 2017-05-01 VITALS — BP 110/62 | HR 80 | Ht 67.0 in | Wt 144.6 lb

## 2017-05-01 DIAGNOSIS — K219 Gastro-esophageal reflux disease without esophagitis: Secondary | ICD-10-CM

## 2017-05-01 DIAGNOSIS — J432 Centrilobular emphysema: Secondary | ICD-10-CM | POA: Diagnosis not present

## 2017-05-01 DIAGNOSIS — J9611 Chronic respiratory failure with hypoxia: Secondary | ICD-10-CM | POA: Diagnosis not present

## 2017-05-01 DIAGNOSIS — Z8679 Personal history of other diseases of the circulatory system: Secondary | ICD-10-CM

## 2017-05-01 DIAGNOSIS — I10 Essential (primary) hypertension: Secondary | ICD-10-CM | POA: Diagnosis not present

## 2017-05-01 NOTE — Progress Notes (Addendum)
Subjective:     Patient ID: Andrew [name], male   DOB: 1940-11-19, 76 y.o.   MRN: [id number]  HPI  76 y/o WM, an ex-smoker quit in 1986, w/ severe bullous emphysema & hx of RUL bullous resection in 2007; Hx both hypercarbic & hypoxemic resp failure w/ cor pulmonale & secondary pulm HTN on Revatio x yrs; He has been stable for >10 yrs on the same pulm regimen as he has travelled around the [place] living in [place], [place], and [place]...  ~  October 02, 2015:  Initial pulmonary evaluation by SN>  His PCP is Dr. Lujean Amel, [name]...       Andrew [name] is a 76 y/o gentleman from [place]- moved here to [place] ~58mo ago to live w/ his daughter & son-in-law;  He has a long convoluted history & we have none of his prev objective data to review;  He tells me that he has known about COPD/Emphysema for >10 yrs and in 2007 he had right thoracotomy & "bleb-ectomy";  After this procedure he was placed on Oxygen at 2L/min and BiPAP to use at night, along w/ ADVAIR250Bid & SPIRIVA daily, plus REVATIO20Tid for pulmonary HTN;  He was also treated w/ NEBS for about 1-33yrs then this was discontinued;  He has pretty much been on this same regimen for the past 9 yrs w/o much change, despite or maybe because he has moved around a lot- [place] (surg done there in 2007), to [place], back to [place], on to [place] for 6 yrs, then back to [place] over the last yr or so...  He describes himself as being rock-solid stable on this exact regimen since 2007- he had Cath (?left & right heart) in 2007, told 1 blockage, good LVF ?right heart results, started on O2, BiPAP, and Revatio but he does not know why?  He notes min cough when supine & in early AM attributed to reflux; min if any sput production, no hemoptysis, he denies SOB but states DOE "if I over-exert" eg- walking, lifting/carrying, stairs, etc; he notes that ADLs are ok- no problem (he is stoic);  He denies CP, palpit, f/c/s, edema... He hasn't been  to an ER since 2007 he says & that was also the last time he had any Pred; he thinks he had CXR, PFT, 2DEcho all earlier this yr...   Smoking Hx>  He is an ex-smoker, started in his teens, smoked for 30 yrs up to 1ppd, quit in 1986; This is a 30 pack-yr hx, he does not recall ever being checked for A1AT defic...  Pulmonary Hx>  COPD/ Emphysema w/ right upper lobe "bleb-ectomy) 2007 in [place]; he has chronic hypoxemic resp failure on O2 at 2L/min since 2007;  He tells me that he was started on BiPAP about that same time but he doesn't know why- never had sleep study, not on CPAP prev, he does not know about pCO2 levels etc ("I like the fresh cool air");  His BiPAP came from [facility] in [place]- states he does not know the settings, machine never downloaded, etc;  He has also been on Revatio20Tid since 2007, apparently never tried on other meds, dose never adjusted- he knows about "pulmonary hypertension" but he doesn't know any details and it doesn't appear to have been followed up, and meds kept the same from doctor to doctor...   Medical Hx>  HBP, ?nonobstructive CAD, HL, thyroid nodule, GERD, constipation, BPH, insomnia...  Family Hx>  Father died w/  Emphysema & was a former smoker; no other hx lung dis in the family; Alpha-1 status is unknown...  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   Current Meds>  Oxygen 2L/min pulse-dose concentrator, Advair250Bid, Spiriva daily, Revatio20Tid, CardizemCD240, Crestor10, Nexium20, Proscar5, Restoril30...  EXAM shows Afeb, VSS, O2sat=93% on 2L/min pulse-dose;  Heent- neg, mallampati1;  Chest- decr BS at bases, can't augment BS voluntarily, w/o w/r/r;  Heart- RR w/o m/r/g;  Abd- soft, neg;  Ext- neg w/o c/c/e;  Neuro- intact...  CXR 10/02/15 showed norm heart size,  COPD, bullous emphysema/ hyperinflation, scarring right apex, NAD...   Spirometry 10/02/15 showed FVC=2.70 (69%), FEV1=1.12 (37%), %1sec=41, mid-flows reduced at 18% predicted; this is c/w severe airflow obstruction & GOLD Stage 3 COPD  Ambulatory oxygen saturation test 10/02/15> on O2 at 2L/min pulse-dose concentrator: O2sat=96% on 2L at rest w/ pulse=87; he walked 2 laps w/ his O2, stopped due to dyspnea, lowest O2sat=89% w/ pulse=113/min...  LAB 10/02/15>  Alpha-1-Antitrypsin level => pending (he never went to the lab for this blood test)  2DEchocardiogram 10/09/15 showed norm LVF w/ EF=55-60%, norm wall motion, mild Andrew, mild RA dil, PAsys est 93mmHg... Pt on Revatio 20Tid x 59yrs & I rec we wean slowly (Decr to Bid now)...    IMP >>     COPD/ bullous emphysema> s/p RUL "bleb-ectomy" in 2007, severe airflow obstruction w/ GOLD Stage3 COPD> on Advair250Bid & Spiriva daily; apparently he has no use for a rescue inhaler; prev on NEB w/ Albut but not for several yrs.     Chronic hypoxemic respiratory failure on O2 at 2L/min via pulse-dose concentrator...    Pt reports using BiPAP since 2007, never been on CPAP, never had sleep study he says, unknown ABGs or pCO2 data; machine from [facility] we will try to get the old data => none received.    Hx pulmonary hypertension on Revatio20Tid since 2007 w/o additional med trials or dose adjustments> we do not have any of the objective data from his prev physician teams... Current 2DEcho w/ PAsys est 41mmHg & we will wean the Revatio to Bid at this point => he does not want to wean further for "other" reasons...    Medical issues include:  HBP, ?nonobstructive CAD, HL, thyroid nodule, GERD, BPH, insomnia... PLAN >>     Andrew [name] has a distinctly patchy history to go along w/ his severe airflow obstruction & bullous emphysema;  We really need his old objective data from 2007 when he was started on O2 & BiPAP after his RUL bleb reduction surg;  He will try to get  names and numbers for [place]- in the meanwhile we will contact his last physician in [facility] for their more recent data as we establish out data base here in [place];  He is very concerned that he wants [place] to continue his current regimen which has served him well over the last 47yrs;  Continue Advair250Bid, Spiriva daily, Revatio20Tid=>Bid, O2 at 2L/min, and the BiPAP nightly as currently set... We plan ROV recheck in 1 month... NOTE> 2DEcho w/ PAsys est ~8mmHg & we will slowly wean Revatio...  ~  November 01, 2015:  64mo ROV & Andrew [name] reports stable, doing satis & notes no untoward effects from cutting the Revatio to 20mg Bid; he denies CP, palpit, incr SOB, edema,  etc; he continues on the O2 at 2L/min, BiPAP from [place], [place] once daily; we have not received any records from his mult physicians ([place], [place], [place])...    EXAM shows Afeb, VSS, O2sat=92% on 2L/min pulse-dose;  Heent- neg, mallampati1;  Chest- decr BS at bases, can't augment BS voluntarily, w/o w/r/r;  Heart- RR w/o m/r/g;  Abd- soft, neg;  Ext- neg w/o c/c/e;  Neuro- intact... IMP/PLAN>>  See prob list above- he is stable on this regimen but does not want to wean the Revatio further for "other" reasons; OK to continue current med regimen- advised regular exercise vs pulm rehab program; given ZPak for prn use over the winter & he knows to call for any resp issues, incr dyspnea, etc... He remains on BiPAP but we do not have any data- no records received from prev pulm physicians, no notes from [place], no download data from his machine- we will again try to make contact w/ his DME company... We plan ROV in 3-39mo.  ~  Mar 18, 2016:  4-37mo ROV & post hosp visit>  Andrew [name] has severe COPD/Emphysema, GOLD Stage 3 w/ FEV1=1.12L (37%predicted) in [id number],  Hx right thoracotomy & "bleb-ectomy" in 2007;  after this procedure he says he was placed on Oxygen at 2L/min and BiPAP to use at night, along w/ ADVAIR250Bid, SPIRIVA  daily, and REVATIO20Tid for pulmonary HTN (2DEcho here 11/16 showed PAsys est=74mmHg)-- we do not have any of that data from [place] (we have been unsuccessful in obtaining old data from any of his prev physicians); he has been resistent to any adjustment in his medication regimen for various reasons...     He's been [place] x2 since last OV> 1st [place] 2/9 - 01/03/16 by [facility] w/ CAP- CXR showed severe bullous emphysema, incr markings in RLL and atelectasis in R-mid lung, Temp 103, Lactate=2.4, WBC 16K; treated w/ O2, Solumedrol=>Pred, Zosyn/Vanco=>Levaquin, NEBs, etc; disch home w/ home health help- ?seen by his PCP after disch...    2nd [facility] 3/26 - 02/15/16 by [facility] w/ 1d hx incr SOB, wheezing, productive cough & felt to have a COPD exac; CXR showed his COPE/E, persistent incr markings in right base, WBC was elev at 20K, BNP=60, no pos cultures; he was treaed w/ O2, Solumedrol, Roceph/Zithro, NEBs, etc; he was disch on Levaquin & Pred; he developed urinary retention & foley placed (weaned off in NH); he was debilitated & sent to NH for rehab...     He was disch to [facility] for rehab & attended by [facility] note dated 03/01/16 reviewed-- finished Levaquin, weaned off the Pred, they were able to discontinue the foley & he passed voiding trial; disch home after 19d in rehab...     Now back home on same O2= 2L/min days & 3L/min night w/ BiPAP ?settings (he has been on this for yrs and never re-assessed), NEB w/ AlbutTid, Advair250Bid, Spiriva daily, Revatio20Bid (pt refuses to taper this med further);  He has developed pedal edema x3d & needs a low sodium diet + diuretic started but he tells me he is sched to see his PCP soon for this problem...    EXAM shows Afeb, VSS, O2sat=95% on 2L/min pulse-dose;  Heent- neg, mallampati1;  Chest- decr BS at bases, can't augment BS voluntarily, w/o w/r/r;  Heart- RR w/o m/r/g;  Abd- soft, neg;  Ext- neg w/o c/c/e;  Neuro- intact...  CXR 02/11/16 showed  hyperinflation, bullous emphysema, some scarring in right mid lung & both lower  lobes, no infiltrate, no edema...  LABS 01/2016> all reviewed in Epic... IMP/PLAN>>  Andrew [name] has had a protracted resp exac- triggered by prob RLL pneumonia (NOS) superimposed on his severe COPD/bullous emphysema;  Rec to change the Albut for Neb to [place] & continue treatments Tid; continue other meds regularly as outlined;  He is referred to [facility] & hopes to start soon;  We will plan ROV in 75mo sooner if needed.  ~  June 20, 2016:  65mo ROV w/ SN>  Andrew [name] returns for a 69mo ROV & states that he is doing very well- no new complaints or concerns at this time, his PCP is DrKoirala; He started [facility] in June & continues in this program at present; Pt prev inquired about treatments at "[facility]" for regenerative lung tissue (stem cell therapy) which costs ~$10K per treatment & all benefits are antecdotal; I offered to refer him to [facility] [place] [facility] if he wants cutting edge research approach...     EPIC records indicate ER visit 05/08/16 for Abd Pain> VSS, exam was neg, CT Abd showed large fecal burden otherw neg, distended urinary bladder, atherosclerosis, bilat L5 pars defects; Rec to take laxatives...     Severe COPD- GOLD Stage3, bullous emphysema, s/p resection of RUL bleb in 2007> on Advair250Bid & Spiriva daily; he has NEB w/ Duoneb for prn use; Spirometry 09/2015 w/ FEV1=1.12 (37%); he is enrolled in [place] & wants a POC instead of tanks;     Chronic hypoxemic respiratory failure on O2 at 2L/min via POC> ambulatory O2sats 09/2015 dropped to 89% on 2L/min after 2 Laps.    Hx prob hypercarbic resp failure inferred from Pt report of BiPAP ([facility]) since 2007, never been on CPAP, never had sleep study, unknown ABGs or pCO2 data>    Hx cor pulmonale/ pulmonary hypertension on Revatio20Tid since 2007 w/o additional med trials or dose adjustments> we do not have any of the objective data from his prev  physician teams; 2DEcho here 09/2015 showed PAsys=36 and we tried to wean his Revatio but he refused due to "other reasons", finally compromised on Revatio20Bid...    Hx CAP- Hosp 12/2015 w/ RLL opac (nos) & responded to broad spectrum Ab coverage + Sulomedrol, O2, Nebs, etc; readmitted 01/2016 w/ COPD exac- similar Rx but sent to NH for rehab at disch...    Cardiac issues>  ?nonobstructive CAD, cor pulmonale w/ mild pulmHTN & 2DEcho 09/2015 showing norm LVF w/ EF=55-60%, norm wall motion, mild Andrew, mild RA dil, PAsys est 8mmHg...    Medical issues include:  HBP (on CardizemCD240), HL (on Cres10), hx thyroid nodule, GERD (on Nexium40), Constipation, BPH (on Proscar5 & Flomax0.4) w/ hx urinary retention, insomnia (on Restoril30)... EXAM shows Afeb, VSS, O2sat=93% on 2L/min pulse-dose;  Heent- neg, mallampati1;  Chest- decr BS at bases, can't augment BS voluntarily, w/o w/r/r;  Heart- RR w/o m/r/g;  Abd- soft, neg;  Ext- neg w/o c/c/e;  Neuro- intact...  CXR 05/08/16>  Mod bullous emphysema w/ chr changes at the lung bases w/ pleuroparenchymal scarring, surg suture lines ove the right mid lung...  LABS 04/2016 in epic> Chems- ok, BS=149;  CBC- anemia w/ Hg= 10.4-11.7, WBC=15K IMP/PLAN>>  Andrew [name] is stable on his baseline regimen + pulm rehab, etc; rec to continue current meds and exercise; he needs the 2017 Flu vaccine when avail & will call prn any breathing problems; we plan ROV in 72mo...   ADDENDUM>>  Pt Hosp by [facility] 8/11 -  07/01/16 w/ CAP after presenting w/ incr SOB, decr O2sats at pulm rehab, & chills 9he lives w/ school aged grandkids)- CXR showed ?RLL opac, cultures neg & NOS- treated empirically w/ Rocephin & Zithromax, Solumedrol, O2, NEBS, etc; he improved gradually & disch home;  He went to see ENT 8/29 because he thought the pneumonia may have been from reflux & dysphagia- fiberoptic exam revealed some edema & pooling of secretions c/w LPR=> placed on PPI Bid and referred to GI;  Barium esophagram  showed a [place] & mod GERD but no esophagitis mass or stricture;  He saw DrSchooler & had EGD 08/23/16 showing a Yoe, patchy candida=> treated w/ Nystatin, later required Diflucan...   ADDENDUM>>  Pt had colonoscopy 08/23/16 by DrSchooler-- small HH, patchy candida, mild gastitis seen;  Treated w/ Nystatin, continue Prilosec40/d...   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      Severe COPD- GOLD Stage3, bullous emphysema, s/p resection of RUL bleb in 2007> on Advair250Bid & Spiriva daily; he has NEB w/ Duoneb for prn use; Spirometry 09/2015 w/ FEV1=1.12 (37%); he finished his PulmRehab 07/2016 & now exercises 5d/wk at the gym; he finally got his POC that he wanted...    Chronic hypoxemic respiratory failure on O2 at 2L/min via POC> ambulatory O2sats 09/2015 dropped to 89% on 2L/min after 2 Laps; repeat 11/2016 dropped to 85%after 1Lap...    Hx prob hypercarbic resp failure inferred from Pt report of BiPAP ([facility]) since 2007, never been on CPAP, never had sleep study, unknown ABGs or pCO2 data> we never received records from former physician teams or any of his old records...     Hx cor pulmonale/ pulmonary hypertension on Revatio20Tid since 2007 w/o additional med trials or dose adjustments> we do not have any of the objective data from his prev physician teams; 2DEcho here 09/2015 showed PAsys=36 and we tried to wean his Revatio but he refused due to "other reasons", finally compromised on Revatio20Bid...    Hx CAP-  Hosp 12/2015 w/ RLL opac (nos) & responded to broad spectrum Ab coverage + Solumedrol, O2, Nebs, etc; readmitted 01/2016 w/ COPD exac- similar Rx but sent to NH for rehab at disch...    Cardiac issues>  ?nonobstructive CAD, cor pulmonale w/ mild pulmHTN & 2DEcho 09/2015 showing norm LVF w/ EF=55-60%, norm wall motion, mild Andrew, mild RA dil, PAsys est 59mmHg...    Medical issues include:  HBP (on CardizemCD240), HL (on Cres10), hx thyroid nodule, GERD & prob LPR (on Nexium40), Constipation, BPH (on Proscar5 & Flomax0.4) w/ hx urinary retention, insomnia (on Restoril30)...       NOTE> pt is Iron deficient (see below) & needs to f/u w/ his PCP & GI for this...  EXAM shows Afeb, VSS, O2sat=92% on 3L/min;  Heent- neg, mallampati1;  Chest- decr BS at bases, can't augment BS voluntarily, w/o w/r/r;  Heart- RR w/o m/r/g;  Abd- soft, neg;  Ext-  neg w/o c/c/e;  Neuro- intact...  CXR 06/28/16>  Hyperinflation, marked emphysematous changes, crowed lung markings at the bases, no consolidation or effusions...  CXR 11/25/16>  COPD, bullous emphysema, and bilat pleuroparenchymal changes c/w scarring, no acute abnormality  Ambulatory O2sat on 3L/min pulse-dose POC>  O2sat=98% on 3L/min pulse-dose at rest w/ HR=90/min;  He ambulated only 1Lap (185') w/ O2sat drop to 85% w/ HR=108/min...  LABS 11/25/16>  Chems- wnl x BS=108;  A1c=6.1;  CBC- Hg=12.3, mcv=90, WBC=7.8K;  Fe=31 (6.6%sat) & Ferritin=8.7;  B12=661 IMP/PLAN>>  There is no such thing as a mild exac for Andrew [name]- even the mildest of URIs can cause a severe COPD exac- we discussed Rx w/ Levaquin500 x7d, Pred20mg - 5d taper (see AVS), + Diflucan100 per his request; hopefully he will respond but he has little in the way of reversible factors given his severe emphysema; reminded to go to the ER for [facility] admission if symptoms worsen despite therapy; as noted he is Fe defic & needs further eval per his PCP & GI- copies sent...  ~  December 23, 2016:  69mo ROV & post hospital  check>  Andrew [name] was [facility] 1/20 - 12/11/16 by Diona Browner w/ a COPD exac w/ acute on chronic resp failure;  Pt already on Home O2 at 2L/min and BiPAP Qhs; he had a root canal done, developed a sore throat, then cough, etc; Levaquin & Pred called -in for pt, Hosp for 4d w/ IV solumedrol & ultimately disch on Pred10... Currently c/o DOE w/ ADLs which is new to him as prev he was going to the gym etc...     He is currently taking Duoneb Q6H as needed + Advair250Bid, Spiriva via handihaler daily, Pred 10mg  tabs- slow taper... EXAM shows Afeb, VSS, O2sat=94% on 2L/min;  Heent- neg, mallampati1;  Chest- decr BS at bases, can't augment BS voluntarily, w/o w/r/r;  Heart- RR w/o m/r/g;  Abd- soft, neg;  Ext- neg w/o c/c/e;  Neuro- intact...  CXR 12/07/16 (independently reviewed by me in the PACS system)> norm heart size, calcif Ao, severe COPD w/ apical bullous changes & surg staples on right, NAD.Marland KitchenMarland Kitchen  EKG 12/07/16 showed NSR, rate 87, poor R prog V1-2, otherw WNL.Marland KitchenMarland Kitchen  LABS 11/2016> NOT retaining CO2 (ABG w/ pCO2=39;  Chems- ok;  CBC- ok w/ Hg=11-12 and wbc=21=>12;  BNP= wnl... IMP/PLAN>>  Andrew [name] had a COPD exac & required IV Solumedrol + in hosp attention w/ NEBS etc & now he is approaching his baseline; he called for an order for a Hosp bed but was referred to his PCP for this determination;  We reviewed the NEED for DUONEB Tid followed by Advair250Bid & Spiriva once daily; we will wean his Pred from 10mg /s to 5mg /d & plan ROV receheck in 6wks...  NOTE: >50% of this 75min rov was spent in counseling & coordination of care...  ~  January 28, 2017:  6wk [place] is stable on meds + Pred 5mg /d;  He called 01/15/17 for antibiotics for an infected tooth- called in Augmentin & tooth has been extracted by dentist;  He remains on NEBs w/ Duoneb Tid, followed by Advair250Bid and Spiriva daily;  He reports using his Revatio "prn";  His breathing continues to improve, back in the gym 3d/wk, approaching his baseline he says;  Denies  cough, sput, hemoptysis, CP, etc... We reviewed his prob list as above...  EXAM shows Afeb, VSS, O2sat=96% on 2L/min;  Heent- neg, mallampati1;  Chest- decr BS at bases, can't augment BS  voluntarily, w/o w/r/r;  Heart- RR w/o m/r/g;  Abd- soft, neg;  Ext- neg w/o c/c/e;  Neuro- intact... IMP/PLAN>>  Andrew [name] continues to improve & move toward his baseline; Rec to continue Pred5mg /d thru March then decr to 5mg  Qod til return visit in 30mo...   ~  May 01, 2017:  48mo ROV & Andrew [name] reports a good 72mo interval w/o acute exac and w/o new complaints or concerns; he is back in the gym 3d/wk & back to baseline he says;  As prev noted Andrew [name] is on BiPAP & has been for yrs- initiated by a pulm physician in [place] yrs ago & I presume this was done for COPD & CO2 retention ant NOT because of sleep apnea; he does not know his settings 7 we have been unable to get old records indicating his initial parameters or follow up monitoring; cureent problem is that his machine is old, mask is 78yr old, no tubing supplies in a long time;  For his part he says he does NOT rest well, just in short intervals;  He goes to bed at 11PM using his BiPAP; wakes at 3-4AM & can't get back to sleep; maybe eats breakfast at 4AM and drifts back to sleep (w/o BiPAP) until 7AM or so; similarly he might nap for 1H during the day (w/o BiPAP); he currently thinks that the BiPAP makes no difference in his sleep & it would just as well suit him to stop it if able...  WE DISCUSSED REASSESSMENT w/ ABG on RA, and a HOME SLEEP TEST on RA to evaluate for sleep apnea and hypercarbic/ hypoxemic resp failure...     [facility] does his Home O2 (2L/min continuous) and [facility] does his nursing care    Andrew [name] also tells ne he had a suspicious mole removed from his left post shoulder ~59mo ago; Bx= melanoma, then wider excision by Sherre Lain at the [facility]...     Severe COPD- GOLD Stage3, bullous emphysema, s/p resection of RUL bleb in 2007> on Advair250Bid & Spiriva  daily; he has NEB w/ Duoneb for prn use; he is off PRED now; Spirometry 09/2015 w/ FEV1=1.12 (37%); he finished his PulmRehab 07/2016 & now exercises regularly at the gym...    Chronic hypoxemic respiratory failure on O2 at 2L/min via POC> ambulatory O2sats 09/2015 dropped to 89% on 2L/min after 2 Laps; repeat 11/2016 dropped to 85% after 1Lap...    Hx prob hypercarbic resp failure inferred from Pt report of BiPAP ([facility]) since 2007, never been on CPAP, never had sleep study> we never received records from former physician teams or any of his old records; ABGs in [facility] 12/2015 pH=7.44/ pCO2=35/ pO2=69 ?on RA; 11/2016 pH=7.44/ pCO2=39/ pO2=146 on 4Lnc;   => he has agreed to re-assessment here 2018 w/ ABG on RA and Home Sleep Study => pending...    Hx cor pulmonale/ pulmonary hypertension on Revatio20Tid since 2007 w/o additional med trials or dose adjustments> we do not have any of the objective data from his prev physician teams; 2DEcho here 09/2015 showed PAsys=36 and we tried to wean his Revatio but he refused due to "other reasons", finally compromised on Revatio20Bid...    Hx CAP- Hosp 12/2015 w/ RLL opac (nos) & responded to broad spectrum Ab coverage + Solumedrol, O2, Nebs, etc; readmitted 01/2016 w/ COPD exac- similar Rx but sent to NH for rehab at disch...    Cardiac issues> no cardiologist> ?nonobstructive CAD, cor pulmonale w/ mild pulmHTN & 2DEcho 09/2015 showing norm LVF  w/ EF=55-60%, norm wall motion, mild Andrew, mild RA dil, PAsys est 19mmHg...    Medical issues> PCP= Dr. Lauretta Grill Koirala ([facility])>  HBP (on CardizemCD240), HL (on Cres10), hx thyroid nodule, GERD & prob LPR (GI= DrSchooler, on Prilosec40), Constipation, BPH (on Proscar5 & Flomax0.4) w/ hx urinary retention, insomnia (on Restoril30)...       NOTE> pt is Iron deficient & needs to f/u w/ his PCP & GI for this>> LABS 11/25/16>  Chems- wnl x BS=108;  A1c=6.1;  CBC- Hg=12.3, mcv=90, WBC=7.8K;  Fe=31 (6.6%sat) & Ferritin=8.7;   B12=661 EXAM shows Afeb, VSS, O2sat=90% on 2L/min pulse dose;  Heent- neg, mallampati1;  Chest- decr BS at bases, can't augment BS voluntarily, w/o w/r/r;  Heart- RR w/o m/r/g;  Abd- soft, neg;  Ext- neg w/o c/c/e;  Neuro- intact...  ABG on RA> pending  Home Sleep Test> pending IMP/PLAN>>  Andrew [name] has agreed to proceed w/ home sleep test to r/o OSA and we will further assess w/ ABG on RA to check for CO2 retention and any indication for BiPAP (doubt he will qualify at this time);  He will continue w/ his O2, Advair250, Spiriva, Duoneb vs ProventilHFA which he uses prn;  He does not appear to need the Revatio either but declines to discontinue this medication for other reasons.  ADDENDUM>>  Home Sleep Test done 05/20/17>  4H study showed mild OSA w/ AHI=6.0/hr;  Study done on RA-- lowest O2sat was 74% w/ an ave of 87%;  Pt given the option for in-lab CPAP titration study vs trial CPAP w/ autoset & a mask fit session w/ Lynnae Sandhoff.  He prefers the latter & states he doesn't believe that he has OSA => we will arrange a mask fit session, the order a ResMed S10, air/ auto- set 5/15, w/ heated humidity, cliamate controlled tubing, enroll in airview w/ ROV in 6-8wks after starting for face to face and download...     Past Medical History:  Diagnosis Date  . BiPAP (biphasic positive airway pressure) dependence    Pt denies history of OSA  . BPH (benign prostatic hyperplasia)   . Cancer ([place])    skin  . COPD (chronic obstructive pulmonary disease) ([place])   . Dysphagia   . Emphysema lung ([place])   . GERD (gastroesophageal reflux disease)    " Silent reflux"  . Hearing loss    right ear  . HLD (hyperlipidemia)   . Hypertension   . Oxygen dependent    2-3 liters  . Oxygen dependent   . Pneumonia   . Pulmonary hypertension ([facility])   Medical Hx>  HBP, ?nonobstructive CAD, HL, thyroid nodule, GERD, constipation, BPH, insomnia... Meds include>  CardizemCD240, Crestor10, Nexium20, Miralax, Proscar10,  Restoril30...   Past Surgical History:  Procedure Laterality Date  . CARDIAC CATHETERIZATION    . CATARACT EXTRACTION W/ INTRAOCULAR LENS  IMPLANT, BILATERAL    . ESOPHAGOGASTRODUODENOSCOPY (EGD) WITH PROPOFOL N/A 08/23/2016   Procedure: ESOPHAGOGASTRODUODENOSCOPY (EGD) WITH PROPOFOL;  Surgeon: Wilford Corner, MD;  Location[facility] ENDOSCOPY;  Service: Endoscopy;  Laterality: N/A;  . LUNG SURGERY    . TONSILLECTOMY    Hx Thoracotomy & RUL Bleb-ectomy 2007 in [place], [place]...   Outpatient Encounter Prescriptions as of 05/01/2017  Medication Sig  . ADVAIR DISKUS 250-50 MCG/DOSE AEPB Inhale 1 puff into the lungs 2 (two) times daily.  Marland Kitchen albuterol (PROVENTIL HFA;VENTOLIN HFA) 108 (90 Base) MCG/ACT inhaler Inhale 2 puffs into the lungs every 6 (six) hours as needed for wheezing or shortness  of breath.  . diltiazem (CARTIA XT) 240 MG 24 hr capsule Take 240 mg by mouth at bedtime.  . finasteride (PROSCAR) 5 MG tablet Take 5 mg by mouth at bedtime.   Marland Kitchen ibuprofen (ADVIL,MOTRIN) 200 MG tablet Take 200 mg by mouth every 6 (six) hours as needed for moderate pain.  Marland Kitchen ipratropium-albuterol (DUONEB) 0.5-2.5 (3) MG/3ML SOLN Take 3 mLs by nebulization every 6 (six) hours as needed. Dx: J44.9 (Patient taking differently: Take 3 mLs by nebulization every 6 (six) hours as needed (shortness of breath/ wheezing). Dx: J44.9)  . liver oil-zinc oxide (DESITIN) 40 % ointment Apply 1 application topically See admin instructions. Apply to left butt cheek for wound care approximately every 4 hours while awake  . Multiple Vitamins-Minerals (MULTIVITAMIN WITH MINERALS) tablet Take 1 tablet by mouth daily.  Marland Kitchen omeprazole (PRILOSEC) 40 MG capsule Take 40 mg by mouth daily.  Marland Kitchen OVER THE COUNTER MEDICATION Apply 1 application topically See admin instructions. Protective ointment from home health care - apply approximately every 4 hours to left butt cheek for wound care  . OXYGEN Inhale 2-3 L into the lungs continuous. 2L daytime,  3L at night  . oxymetazoline (AFRIN) 0.05 % nasal spray Place 1 spray into both nostrils daily as needed for congestion.  . polyethylene glycol (MIRALAX / GLYCOLAX) packet Take 17 g by mouth daily. (Patient taking differently: Take 17 g by mouth at bedtime. Mix in 8 oz liquid and drink)  . rosuvastatin (CRESTOR) 10 MG tablet Take 10 mg by mouth daily.  . sildenafil (REVATIO) 20 MG tablet Take 1 tablet (20 mg total) by mouth 2 (two) times daily.  Marland Kitchen SPIRIVA HANDIHALER 18 MCG inhalation capsule INHALE THE CONTENTS OF 1 CAPSULE EVERY DAY (Patient taking differently: INHALE THE CONTENTS OF 1 CAPSULE EVERY DAY AT NOON)  . tamsulosin (FLOMAX) 0.4 MG CAPS capsule Take 1 capsule (0.4 mg total) by mouth daily after breakfast. (Patient taking differently: Take 0.4 mg by mouth at bedtime. )  . temazepam (RESTORIL) 30 MG capsule Take 30 mg by mouth at bedtime.  . vitamin C (ASCORBIC ACID) 500 MG tablet Take 500 mg by mouth daily.  Marland Kitchen VITAMIN E PO Take 1 capsule by mouth daily.  . predniSONE (DELTASONE) 10 MG tablet OFF now    No Known Allergies   Immunization History  Administered Date(s) Administered  . Influenza, High Dose Seasonal PF 07/22/2016  . Influenza-Unspecified 09/19/2015  . PPD Test 02/15/2016  . Pneumococcal Conjugate-13 09/19/2015    Current Medications, Allergies, Past Medical History, Past Surgical History, Family History, and Social History were reviewed in [employer] record.   Review of Systems             All symptoms NEG except where BOLDED >>  Constitutional:  F/C/S, fatigue, anorexia, unexpected weight change. HEENT:  HA, visual changes, hearing loss, earache, nasal symptoms, sore throat, mouth sores, hoarseness. Resp:  cough, sputum, hemoptysis; SOB, tightness, wheezing. Cardio:  CP, palpit, DOE, orthopnea, edema. GI:  N/V/D/C, blood in stool; reflux, abd pain, distention, gas. GU:  dysuria, freq, urgency, hematuria, flank pain, voiding  difficulty. MS:  joint pain, swelling, tenderness, decr ROM; neck pain, back pain, etc. Neuro:  HA, tremors, seizures, dizziness, syncope, weakness, numbness, gait abn. Skin:  suspicious lesions or skin rash. Heme:  adenopathy, bruising, bleeding. Psyche:  confusion, agitation, sleep disturbance, hallucinations, anxiety, depression suicidal.   Objective:   Physical Exam       Vital Signs:  Reviewed.Marland KitchenMarland Kitchen  General:  WD, WN, 76 y/o WM in NAD; alert & oriented; pleasant & cooperative... HEENT:  [place]/AT; Conjunctiva- pink, Sclera- nonicteric, EOM-wnl, PERRLA, EACs-clear, TMs-wnl; NOSE-clear; THROAT-clear & wnl.  Neck:  Supple w/ fair ROM; no JVD; normal carotid impulses w/o bruits; no thyromegaly +small nodule palpated; no lymphadenopathy.  Chest:  Overinflated, resonant percussion note, decr BS bilat & can't augment BS voluntarily, no w/r/r heard... Heart:  Regular Rhythm; norm S1 & S2 without murmurs, rubs, or gallops detected. Abdomen:  Soft & nontender- no guarding or rebound; normal bowel sounds; no organomegaly or masses palpated. Ext:  decr ROM; without deformities or arthritic changes; no varicose veins, +venous insuffic, no edema;  Pulses intact w/o bruits. Neuro:  No focal neuro deficits; sensory testing normal; gait normal & balance OK. Derm:  No lesions noted; no rash etc. Lymph:  No cervical, supraclavicular, axillary, or inguinal adenopathy palpated.   Assessment:      IMP >>     Severe COPD- GOLD Stage3, bullous emphysema, s/p resection of RUL bleb in 2007> on Advair250Bid & Spiriva daily; he has NEB w/ Duoneb for prn use; Spirometry 09/2015 w/ FEV1=1.12 (37%); he has completed pulm rehab & now exercises at the gym 5d/wk;  He's had mult COPD exac => treat w/ Levaquin, Pred taper, continue Advair/ spiriva/ NEBS/ O2...    Chronic hypoxemic respiratory failure on O2 at 2L/min via POC> ambulatory O2sats 09/2015 dropped to 89% on 2L/min after 2 Laps; he dropped to 85% after 1Lap on  3L/min POC 11/2016...    Hx prob hypercarbic resp failure inferred from Pt report of BiPAP ([facility]) since 2007, never been on CPAP, never had sleep study, unknown ABGs or pCO2 data> we never received data from his prev physicians.    Hx cor pulmonale/ pulmonary hypertension on Revatio20Tid since 2007 w/o additional med trials or dose adjustments> we do not have any of the objective data from his prev physician teams; 2DEcho here 09/2015 showed PAsys=36 and we tried to wean his Revatio but he refused due to "other reasons", finally compromised on Revatio20Bid...    Hx CAP- Hosp 12/2015 w/ RLL opac (nos) & responded to broad spectrum Ab coverage + Sulomedrol, O2, Nebs, etc; readmitted 01/2016 w/ COPD exac- similar Rx but sent to NH for rehab at disch...    Cardiac issues>  ?nonobstructive CAD, cor pulmonale w/ mild pulmHTN & 2DEcho 09/2015 showing norm LVF w/ EF=55-60%, norm wall motion, mild Andrew, mild RA dil, PAsys est 38mmHg...    Medical issues include:  HBP (on CardizemCD240), HL (on Cres10), hx thyroid nodule, GERD (on Nexium40), constipation, BPH (on Proscar5 & Flomax0.4) w/ hx urinary retention, insomnia (on Restoril30)...       NOTE>  He is Iron deficient & need further eval & Rx from his PCP & GI...  PLAN >>  10/02/15>   Andrew [name] has a distinctly patchy history to go along w/ his severe airflow obstruction & bullous emphysema;  We really need his old objective data from 2007 when he was started on O2 & BiPAP after his RUL bleb reduction surg;  He will try to get names and numbers for [place]- in the meanwhile we will contact his last physician in [facility] for their more recent data as we establish out data base here in [place];  He is very concerned that he wants [place] to continue his current regimen which has served him well over the last 104yrs;  Continue Advair250Bid, Spiriva daily, Revatio20tid, O2 at 2L/min, and the  BiPAP nightly as currently set...  11/01/15>  See prob list above- he is stable on this  regimen but does not want to wean the Revatio further for "other" reasons; OK to continue current med regimen- advised regular exercise vs pulm rehab program; given ZPak for prn use over the winter & he knows to call for any resp issues, incr dyspnea, etc... He remains on BiPAP but we do not have any data- no records received from prev pulm physicians, no notes from [facility], no download data from his machine- we will again try to make contact w/ his DME company... 03/18/16>   Andrew [name] has had a protracted resp [place] x2 this spring- triggered by prob RLL pneumonia (NOS) superimposed on his severe COPD/bullous emphysema;  Rec to change the Albut for Neb to [place] & continue treatments Tid; continue other meds regularly as outlined;  He is referred to [facility] & hopes to start soon;  We will plan ROV in 6mo sooner if needed. 06/20/16>   Andrew [name] is stable on his baseline regimen + pulm rehab, etc; rec to continue current meds and exercise; he needs the 2017 Flu vaccine when avail & will call prn any breathing problems; we plan ROV in 25mo...  11/25/16>   There is no such thing as a mild exac for Andrew [name]- even the mildest of URIs can cause a severe COPD exac- we discussed Rx w/ Levaquin500 x7d, Pred20mg - 5d taper (see AVS), + Diflucan100 per his request; hopefully he will respond but he has little in the way of reversible factors given his severe emphysema; reminded to go to the ER for [facility] admission if symptoms worsen despite therapy; as noted he is Fe defic & needs further eval per his PCP & GI- copies sent. => subseq Hosp for 4d w/ IV Solumedrol, NEBs/ O2/ etc... 05/01/17>   Andrew [name] has agreed to proceed w/ home sleep test to r/o OSA and we will further assess w/ ABG on RA to check for CO2 retention and any indication for BiPAP (doubt he will qualify at this time);  He will continue w/ his O2, Advair250, Spiriva, Duoneb vs ProventilHFA which he uses prn;  He does not appear to need the Revatio either but declines to  discontinue this medication for other reasons;     Plan:     Patient's Medications  New Prescriptions   No medications on file  Previous Medications   ADVAIR DISKUS 250-50 MCG/DOSE AEPB    Inhale 1 puff into the lungs 2 (two) times daily.   ALBUTEROL (PROVENTIL HFA;VENTOLIN HFA) 108 (90 BASE) MCG/ACT INHALER    Inhale 2 puffs into the lungs every 6 (six) hours as needed for wheezing or shortness of breath.   DILTIAZEM (CARTIA XT) 240 MG 24 HR CAPSULE    Take 240 mg by mouth at bedtime.   FINASTERIDE (PROSCAR) 5 MG TABLET    Take 5 mg by mouth at bedtime.    IBUPROFEN (ADVIL,MOTRIN) 200 MG TABLET    Take 200 mg by mouth every 6 (six) hours as needed for moderate pain.   IPRATROPIUM-ALBUTEROL (DUONEB) 0.5-2.5 (3) MG/3ML SOLN    INHALE 1 VIAL VIA NEBULAZER EVERY 6 HOURS AS NEEDED   MULTIPLE VITAMINS-MINERALS (MULTIVITAMIN WITH MINERALS) TABLET    Take 1 tablet by mouth daily.   OMEPRAZOLE (PRILOSEC) 40 MG CAPSULE    Take 40 mg by mouth daily.   OVER THE COUNTER MEDICATION    Apply 1 application topically See admin instructions. Protective  ointment from home health care - apply approximately every 4 hours to left butt cheek for wound care   OXYGEN    Inhale 2-3 L into the lungs continuous. 2L daytime, 3L at night   POLYETHYLENE GLYCOL (MIRALAX / GLYCOLAX) PACKET    Take 17 g by mouth daily.   ROSUVASTATIN (CRESTOR) 10 MG TABLET    Take 10 mg by mouth daily.   SILDENAFIL (REVATIO) 20 MG TABLET    Take 1 tablet (20 mg total) by mouth 2 (two) times daily.   SPIRIVA HANDIHALER 18 MCG INHALATION CAPSULE    INHALE THE CONTENTS OF 1 CAPSULE EVERY DAY   TAMSULOSIN (FLOMAX) 0.4 MG CAPS CAPSULE    Take 1 capsule (0.4 mg total) by mouth daily after breakfast.   TEMAZEPAM (RESTORIL) 30 MG CAPSULE    Take 30 mg by mouth at bedtime.   VITAMIN C (ASCORBIC ACID) 500 MG TABLET    Take 500 mg by mouth daily.   VITAMIN E PO    Take 1 capsule by mouth daily.  Modified Medications   No medications on file   Discontinued Medications   PREDNISONE (DELTASONE) 5 MG TABLET    Take 1 tablet (5 mg total) by mouth daily with breakfast.

## 2017-05-01 NOTE — Patient Instructions (Signed)
Today we updated your med list in our EPIC system...    Continue your current medications the same...  We discussed checking a HOME sleep test & I will research the criteria we need to meet for BiPAP at home...    We will contact you w/ the results when available...   Keep up the good work w/ nutrition, exercise, etc...  Call for any questions...  Let's plan a follow up visit in 36mo, sooner if needed for problems.Marland KitchenMarland Kitchen

## 2017-05-15 DIAGNOSIS — M50823 Other cervical disc disorders at C6-C7 level: Secondary | ICD-10-CM | POA: Diagnosis not present

## 2017-05-15 DIAGNOSIS — M50822 Other cervical disc disorders at C5-C6 level: Secondary | ICD-10-CM | POA: Diagnosis not present

## 2017-05-17 ENCOUNTER — Other Ambulatory Visit: Payer: Self-pay | Admitting: Pulmonary Disease

## 2017-05-17 DIAGNOSIS — J9611 Chronic respiratory failure with hypoxia: Secondary | ICD-10-CM

## 2017-05-17 DIAGNOSIS — G4733 Obstructive sleep apnea (adult) (pediatric): Secondary | ICD-10-CM

## 2017-05-17 DIAGNOSIS — J441 Chronic obstructive pulmonary disease with (acute) exacerbation: Secondary | ICD-10-CM

## 2017-05-17 DIAGNOSIS — J432 Centrilobular emphysema: Secondary | ICD-10-CM

## 2017-05-17 DIAGNOSIS — I272 Pulmonary hypertension, unspecified: Secondary | ICD-10-CM

## 2017-05-20 DIAGNOSIS — G4733 Obstructive sleep apnea (adult) (pediatric): Secondary | ICD-10-CM | POA: Diagnosis not present

## 2017-05-21 ENCOUNTER — Other Ambulatory Visit: Payer: Self-pay | Admitting: Pulmonary Disease

## 2017-05-26 ENCOUNTER — Other Ambulatory Visit: Payer: Self-pay | Admitting: *Deleted

## 2017-05-26 DIAGNOSIS — J432 Centrilobular emphysema: Secondary | ICD-10-CM

## 2017-05-26 DIAGNOSIS — Z8679 Personal history of other diseases of the circulatory system: Secondary | ICD-10-CM

## 2017-05-26 DIAGNOSIS — J9611 Chronic respiratory failure with hypoxia: Secondary | ICD-10-CM

## 2017-05-26 DIAGNOSIS — G4733 Obstructive sleep apnea (adult) (pediatric): Secondary | ICD-10-CM | POA: Diagnosis not present

## 2017-05-28 DIAGNOSIS — Z1211 Encounter for screening for malignant neoplasm of colon: Secondary | ICD-10-CM | POA: Diagnosis not present

## 2017-05-28 DIAGNOSIS — R195 Other fecal abnormalities: Secondary | ICD-10-CM | POA: Diagnosis not present

## 2017-05-28 DIAGNOSIS — J439 Emphysema, unspecified: Secondary | ICD-10-CM | POA: Diagnosis not present

## 2017-06-03 ENCOUNTER — Telehealth: Payer: Self-pay | Admitting: Pulmonary Disease

## 2017-06-03 MED ORDER — SPIRIVA HANDIHALER 18 MCG IN CAPS: ORAL_CAPSULE | RESPIRATORY_TRACT | 3 refills | Status: DC

## 2017-06-03 NOTE — Telephone Encounter (Signed)
rx for the spiriva has been signed by SN and I have placed these forms up front and the pt is aware that these are ready to be picked up.

## 2017-06-03 NOTE — Telephone Encounter (Signed)
Form filled to best of my ability and placed in SN's look-at.  Will route to Leigh to follow up on.

## 2017-06-04 ENCOUNTER — Telehealth: Payer: Self-pay | Admitting: Pulmonary Disease

## 2017-06-04 DIAGNOSIS — G4733 Obstructive sleep apnea (adult) (pediatric): Secondary | ICD-10-CM

## 2017-06-04 NOTE — Telephone Encounter (Signed)
Per SN---  Sleep study shows mild OSA with AHI 6/h.  There are 2 options:  1. And this is what SN recs----to do a cpap titration in the sleep lab   Or 2.  Trial of cpap machine and mask fit session.    Spoke with pt and he stated that he feels that he does not have OSA so he would like to do the trial of cpap machine at home and mask fit session.  Will make SN aware.

## 2017-06-04 NOTE — Telephone Encounter (Signed)
Pt is requesting sleep study results from 05/20/17.  SN please advise. Thanks.

## 2017-06-05 ENCOUNTER — Telehealth: Payer: Self-pay | Admitting: Pulmonary Disease

## 2017-06-05 NOTE — Telephone Encounter (Signed)
Called and spoke with pt and cleared everything up with him.  Nothing further is needed.

## 2017-06-05 NOTE — Telephone Encounter (Signed)
Per SN---  Set up a mask fit session with Lynnae Sandhoff.   Set up cpap with [facility].  The orders have been placed.

## 2017-06-16 ENCOUNTER — Other Ambulatory Visit: Payer: Self-pay | Admitting: Pulmonary Disease

## 2017-06-16 DIAGNOSIS — G4733 Obstructive sleep apnea (adult) (pediatric): Secondary | ICD-10-CM

## 2017-06-17 ENCOUNTER — Other Ambulatory Visit (HOSPITAL_BASED_OUTPATIENT_CLINIC_OR_DEPARTMENT_OTHER): Payer: Medicare Other

## 2017-06-18 ENCOUNTER — Telehealth: Payer: Self-pay | Admitting: Pulmonary Disease

## 2017-06-18 NOTE — Telephone Encounter (Signed)
I called pt  He said he got a call from home care company this morning telling him they were going to bring out a cpap machine.  He said he can either accept it or not accept it he guessed.  I told him the order was for a cpap & if he needs a bipap he will have to talk to the nurse - I only send out the orders.  He was saying "no, alright then, thanks, bye.  I asked if he needed to speak to the nurse & he hung up.

## 2017-06-23 DIAGNOSIS — E041 Nontoxic single thyroid nodule: Secondary | ICD-10-CM | POA: Diagnosis not present

## 2017-06-23 DIAGNOSIS — J439 Emphysema, unspecified: Secondary | ICD-10-CM | POA: Diagnosis not present

## 2017-06-23 DIAGNOSIS — J9611 Chronic respiratory failure with hypoxia: Secondary | ICD-10-CM | POA: Diagnosis not present

## 2017-06-23 DIAGNOSIS — R040 Epistaxis: Secondary | ICD-10-CM | POA: Diagnosis not present

## 2017-06-23 DIAGNOSIS — D509 Iron deficiency anemia, unspecified: Secondary | ICD-10-CM | POA: Diagnosis not present

## 2017-06-24 DIAGNOSIS — K219 Gastro-esophageal reflux disease without esophagitis: Secondary | ICD-10-CM | POA: Diagnosis not present

## 2017-06-24 DIAGNOSIS — R1314 Dysphagia, pharyngoesophageal phase: Secondary | ICD-10-CM | POA: Diagnosis not present

## 2017-06-24 DIAGNOSIS — J342 Deviated nasal septum: Secondary | ICD-10-CM | POA: Diagnosis not present

## 2017-06-25 ENCOUNTER — Other Ambulatory Visit: Payer: Self-pay | Admitting: Physician Assistant

## 2017-06-25 DIAGNOSIS — R1314 Dysphagia, pharyngoesophageal phase: Secondary | ICD-10-CM

## 2017-06-25 NOTE — Progress Notes (Signed)
[facility] PREP Weekly Session   Patient Details  Name: Andrew [name] MRN: [id number] Date of Birth: [birth year] Age: 76 y.o. PCP: Lujean Amel, MD  Vitals:   06/25/17 1307  Weight: 138 lb (62.6 kg)        [facility] Weekly seesion - 06/25/17 1300      Weekly Session   Topic Discussed Hitting roadblocks     Fun things you did since last meeting:"lived it up" Things you are grateful for:"life & liberty" Nutrition celebrations:"eat all I can"    Vanita Ingles 06/25/2017, 1:08 PM

## 2017-06-30 ENCOUNTER — Ambulatory Visit
Admission: RE | Admit: 2017-06-30 | Discharge: 2017-06-30 | Disposition: A | Discharge status: Home / Self Care | Payer: Medicare Other | Source: Ambulatory Visit | Attending: Physician Assistant | Admitting: Physician Assistant

## 2017-06-30 DIAGNOSIS — R4702 Dysphasia: Secondary | ICD-10-CM | POA: Diagnosis not present

## 2017-06-30 DIAGNOSIS — R1314 Dysphagia, pharyngoesophageal phase: Secondary | ICD-10-CM

## 2017-07-07 DIAGNOSIS — K219 Gastro-esophageal reflux disease without esophagitis: Secondary | ICD-10-CM | POA: Diagnosis not present

## 2017-07-07 DIAGNOSIS — R1310 Dysphagia, unspecified: Secondary | ICD-10-CM | POA: Diagnosis not present

## 2017-07-11 ENCOUNTER — Telehealth: Payer: Self-pay | Admitting: Pulmonary Disease

## 2017-07-11 MED ORDER — AZITHROMYCIN 250 MG PO TABS: ORAL_TABLET | ORAL | 0 refills | Status: DC

## 2017-07-11 MED ORDER — METHYLPREDNISOLONE 4 MG PO TBPK: ORAL_TABLET | ORAL | 0 refills | Status: DC

## 2017-07-11 NOTE — Telephone Encounter (Signed)
Per SN---   zpak #1  Take as directed Medrol dosepak Start these today and continue all other meds.     Called and spoke with pt and he is aware of SN recs.  meds have been sent in to the pharmacy. Nothing further is needed.

## 2017-07-11 NOTE — Telephone Encounter (Signed)
Called and spoke with pt and he stated that 2 days ago he started with cough that led up to his being SOB>  He stated that he uses his neb TID and the rescue inhaler--he stated that he did get some better yesterday and he stated that he has not been too bad this morning yet as he has been laying around and doing very little.  Pt didn't know if he needed to go to the ER or hold off for now.  Wanted to call and get SN recs.  SN please advise. Thanks  No Known Allergies

## 2017-07-12 ENCOUNTER — Other Ambulatory Visit: Payer: Self-pay | Admitting: Pulmonary Disease

## 2017-07-12 DIAGNOSIS — J441 Chronic obstructive pulmonary disease with (acute) exacerbation: Secondary | ICD-10-CM

## 2017-07-12 DIAGNOSIS — J432 Centrilobular emphysema: Secondary | ICD-10-CM

## 2017-07-12 DIAGNOSIS — I272 Pulmonary hypertension, unspecified: Secondary | ICD-10-CM

## 2017-07-12 DIAGNOSIS — G4733 Obstructive sleep apnea (adult) (pediatric): Secondary | ICD-10-CM

## 2017-07-12 DIAGNOSIS — J9611 Chronic respiratory failure with hypoxia: Secondary | ICD-10-CM

## 2017-07-28 ENCOUNTER — Telehealth: Payer: Self-pay | Admitting: Pulmonary Disease

## 2017-07-28 NOTE — Telephone Encounter (Signed)
lmtcb X1 for pt  

## 2017-07-28 NOTE — Telephone Encounter (Signed)
Called and spoke to pt. Pt questioned why he needs CPAP, if his O2 dropped at night while having sleep study, why does he need CPAP vs just oxygen. Advised pt per phone note from 7.18.18 he has mild OSA and it was advised per SN that a CPAP is indicated. Pt states he does not think he has OSA. Advised pt if he has questions he should come in to discuss this with SN. Appt made with SN on 07/31/17. Pt verbalized understanding and denied any further questions or concerns at this time.

## 2017-07-29 IMAGING — US US AORTA SCREENING (MEDICARE)
1 series · 14 of 18 positions shown · non-contrast
Comparison: None.

CLINICAL DATA: Medicare screening exam for abdominal aortic
aneurysm.

EXAM:
ABDOMINAL AORTA SCREENING ULTRASOUND
TECHNIQUE: Ultrasound examination of the abdominal aorta was performed as a
screening evaluation for abdominal aortic aneurysm.

[Series 1: us aorta screening (medicare) · 0.32mm/px · 14 of 18 slices shown]
[im 1/18]
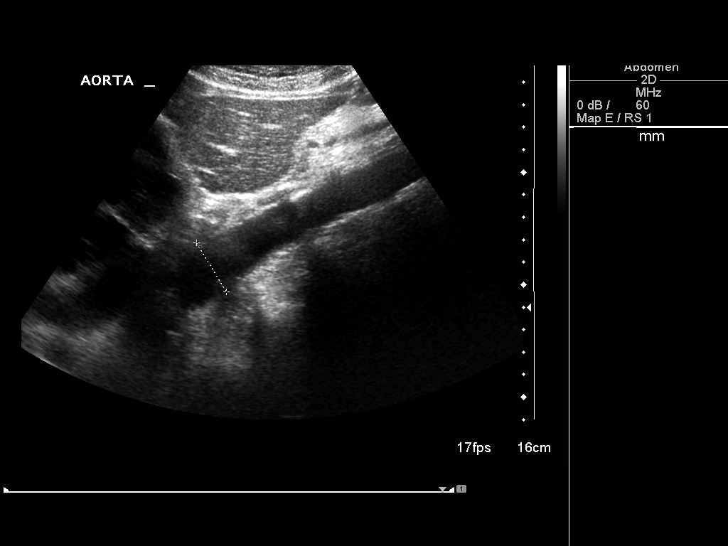
[im 2/18]
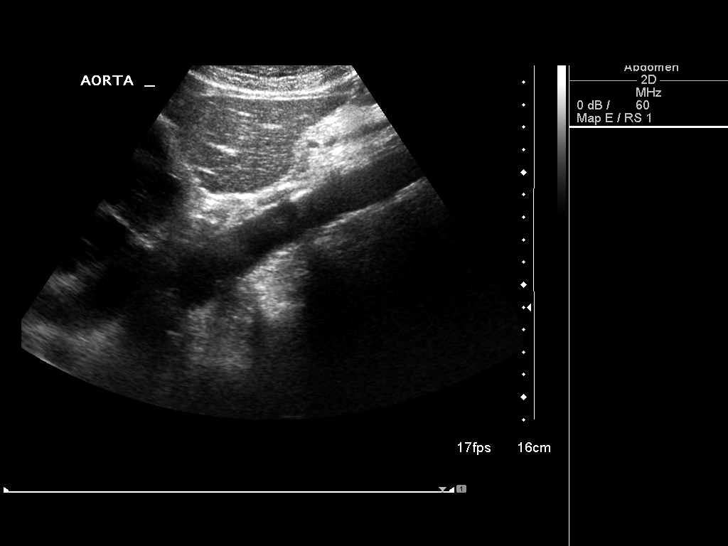
[im 4/18]
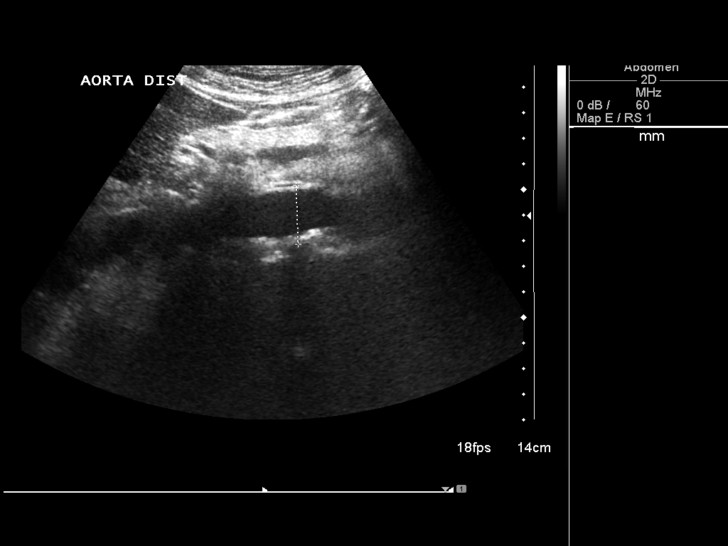
[im 5/18]
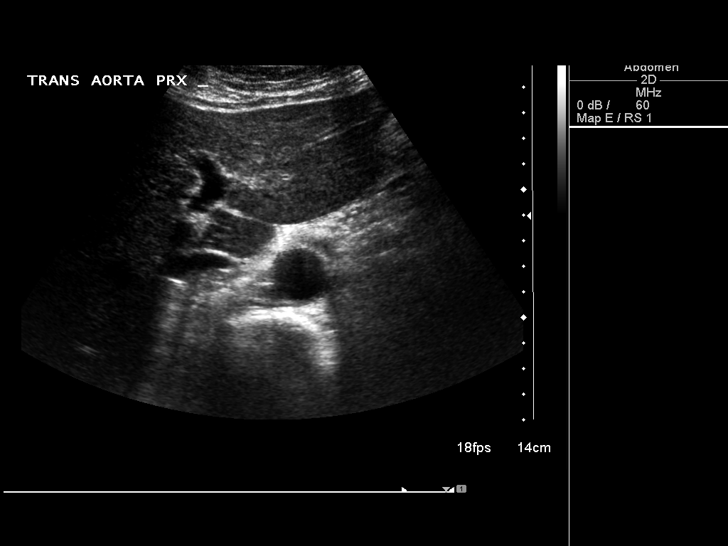
[im 6/18]
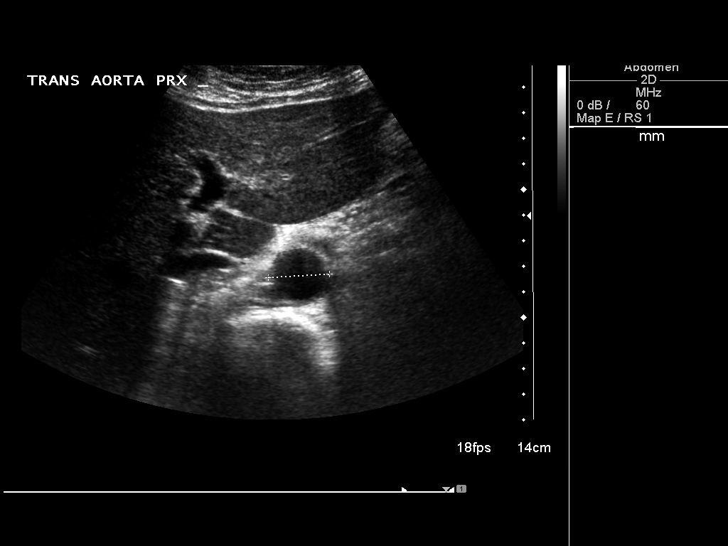
[im 8/18]
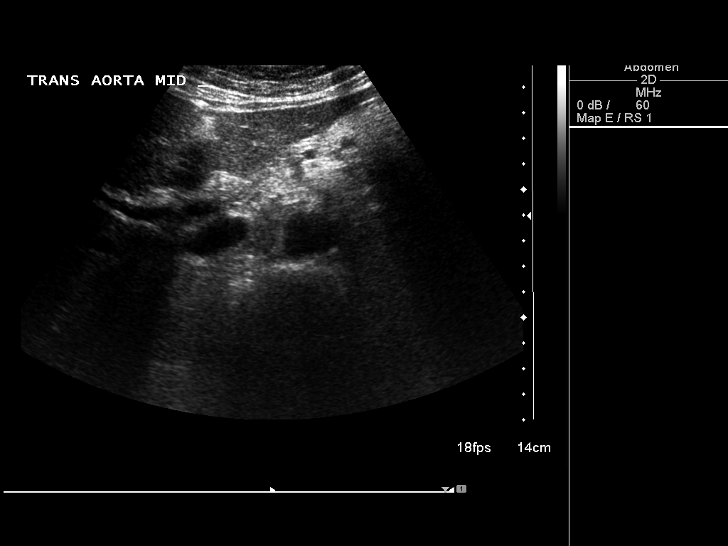
[im 9/18]
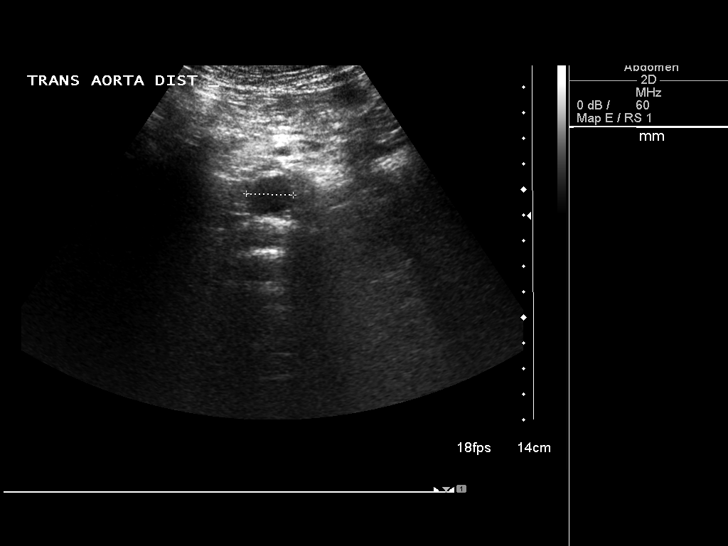
[im 10/18]
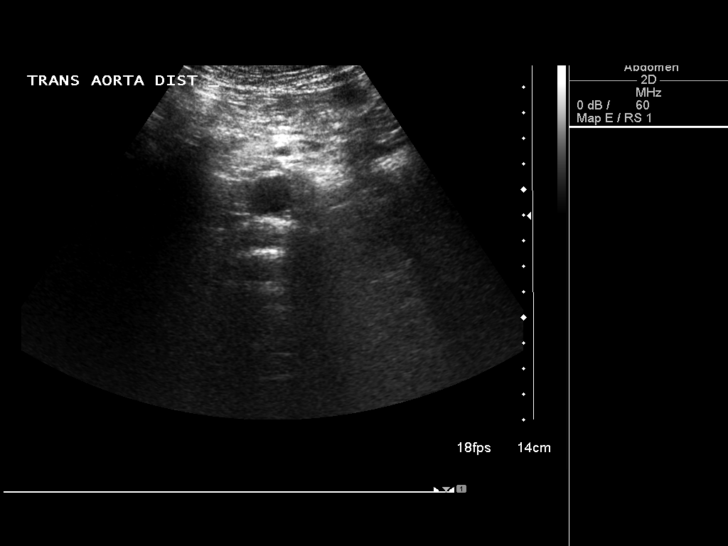
[im 11/18]
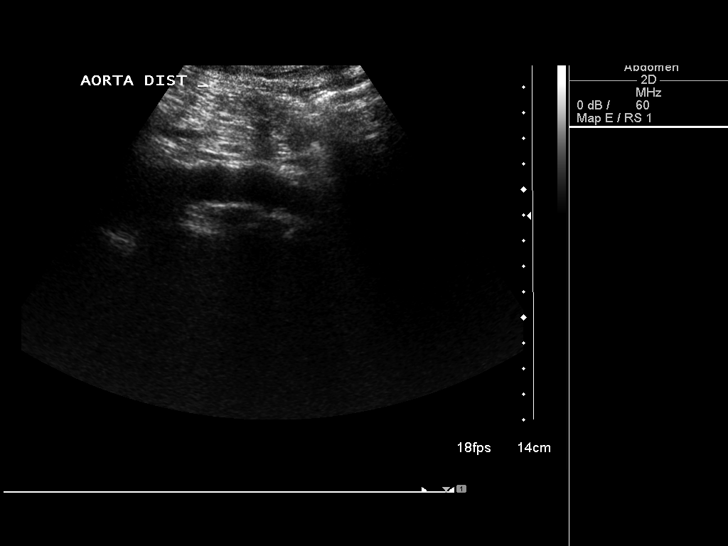
[im 13/18]
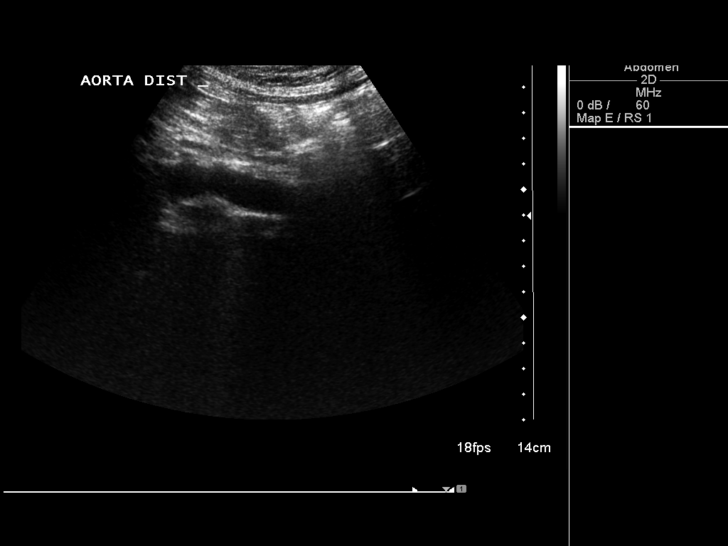
[im 14/18]
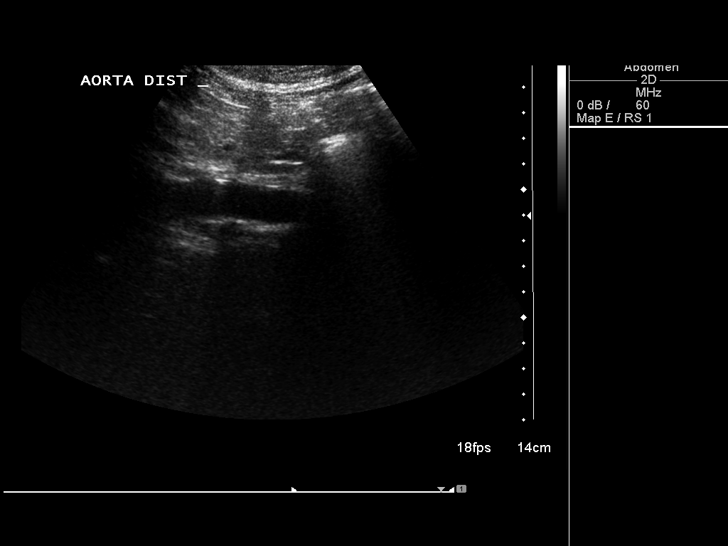
[im 15/18]
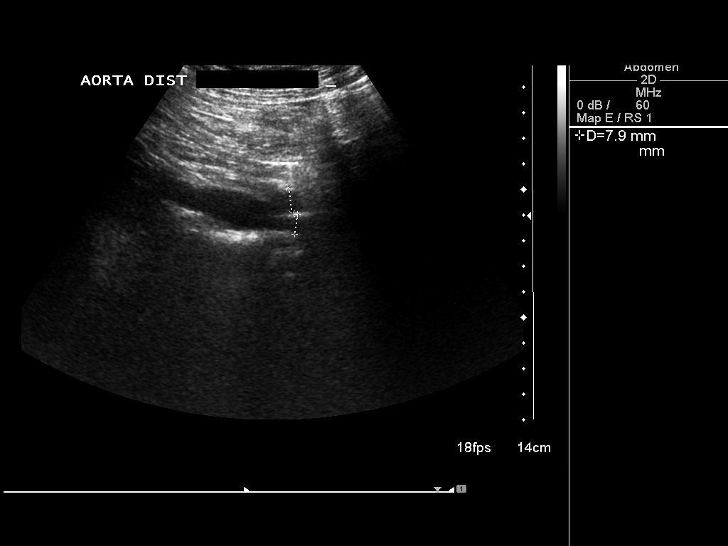
[im 17/18]
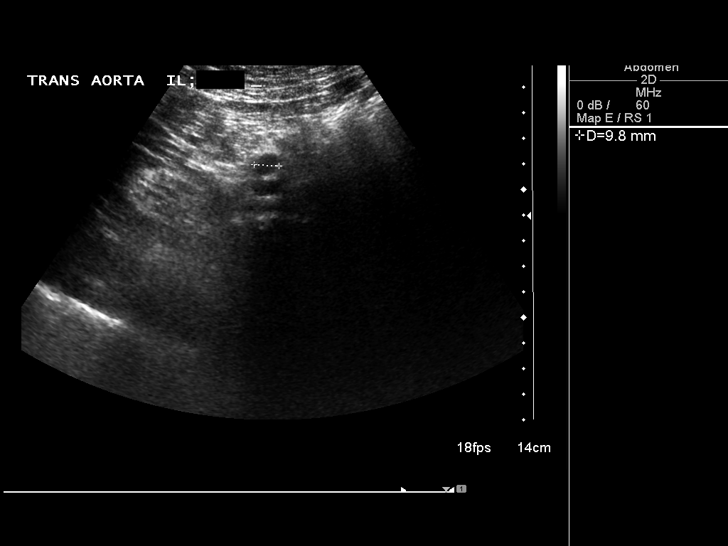
[im 18/18]
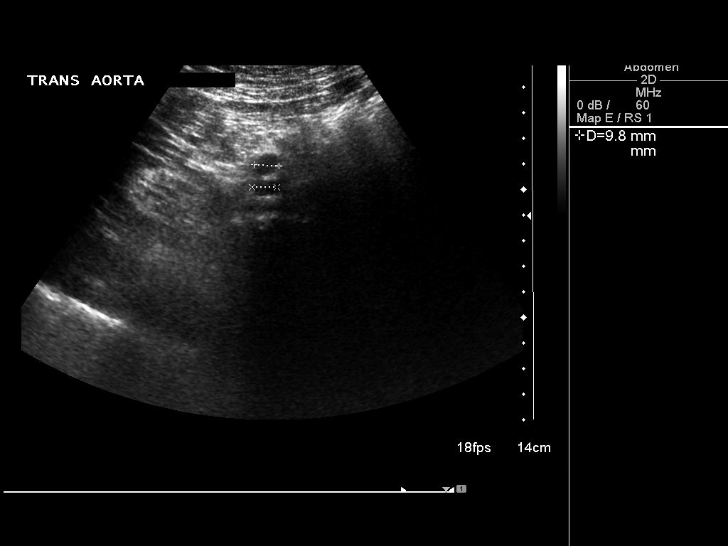

[14 of 18 positions shown; findings below may reference images not displayed]

FINDINGS: Abdominal Aorta

No aneurysm identified.

Maximum Diameter: 2.6 cm, proximal segment. Iliac arteries both
measure 1 cm maximum diameter.
IMPRESSION: 1. Negative for abdominal aortic aneurysm.

## 2017-07-30 DIAGNOSIS — D485 Neoplasm of uncertain behavior of skin: Secondary | ICD-10-CM | POA: Diagnosis not present

## 2017-07-30 DIAGNOSIS — D1801 Hemangioma of skin and subcutaneous tissue: Secondary | ICD-10-CM | POA: Diagnosis not present

## 2017-07-30 DIAGNOSIS — L821 Other seborrheic keratosis: Secondary | ICD-10-CM | POA: Diagnosis not present

## 2017-07-30 DIAGNOSIS — D225 Melanocytic nevi of trunk: Secondary | ICD-10-CM | POA: Diagnosis not present

## 2017-07-30 DIAGNOSIS — Z85820 Personal history of malignant melanoma of skin: Secondary | ICD-10-CM | POA: Diagnosis not present

## 2017-07-30 DIAGNOSIS — L814 Other melanin hyperpigmentation: Secondary | ICD-10-CM | POA: Diagnosis not present

## 2017-07-31 ENCOUNTER — Ambulatory Visit (INDEPENDENT_AMBULATORY_CARE_PROVIDER_SITE_OTHER): Payer: Medicare Other | Admitting: Pulmonary Disease

## 2017-07-31 VITALS — BP 110/62 | HR 70 | Temp 97.7°F | Ht 67.0 in | Wt 137.0 lb

## 2017-07-31 DIAGNOSIS — J9611 Chronic respiratory failure with hypoxia: Secondary | ICD-10-CM

## 2017-07-31 DIAGNOSIS — Z9989 Dependence on other enabling machines and devices: Secondary | ICD-10-CM

## 2017-07-31 DIAGNOSIS — G4733 Obstructive sleep apnea (adult) (pediatric): Secondary | ICD-10-CM

## 2017-07-31 DIAGNOSIS — J432 Centrilobular emphysema: Secondary | ICD-10-CM

## 2017-07-31 NOTE — Patient Instructions (Addendum)
Today we updated your med list in our EPIC system...    Continue your current medications the same...  We reviewed your history going back over 10 years w/ your BiPAP set up from [place]...    We discussed the Medicare requirements for you to get a new BiPAP machine (vs CPAP)-- and this includes documenting your OSA ( the Home Sleep test showed an AHI=6.0/h c/w mild OSA w/ signif hypoxemia on room air...  Next we need to proceed w/ a "CPAP titration" test to see if your tolerate the CPAP & if it eliminates the obstructions and the hypoxemia...    If the CPAP is not tolerated-- then we can try BiPAP to see if it is better tolerated by you...    If the O2 saturations don't improve to >88% on RA w/ the machine, then we can bleed-in the Oxygen & prove that THIS is required to eliminate the hypoxemia...  Hopefully at the end of this process you will qualify for a new BiPAP machine & the nocturnal O2 bleed-in as you have been doing.Marland KitchenMarland Kitchen

## 2017-08-02 ENCOUNTER — Encounter: Payer: Self-pay | Admitting: Pulmonary Disease

## 2017-08-02 NOTE — Progress Notes (Signed)
Subjective:     Patient ID: Andrew [name], male   DOB: 1940-11-19, 76 y.o.   MRN: [id number]  HPI  76 y/o WM, an ex-smoker quit in 1986, w/ severe bullous emphysema & hx of RUL bullous resection in 2007; Hx both hypercarbic & hypoxemic resp failure w/ cor pulmonale & secondary pulm HTN on Revatio x yrs; He has been stable for >10 yrs on the same pulm regimen as he has travelled around the [place] living in [place], [place], and [place]...  ~  October 02, 2015:  Initial pulmonary evaluation by SN>  His PCP is Dr. Lujean Amel, [name]...       Andrew [name] is a 76 y/o gentleman from [place]- moved here to [place] ~58mo ago to live w/ his daughter & son-in-law;  He has a long convoluted history & we have none of his prev objective data to review;  He tells me that he has known about COPD/Emphysema for >10 yrs and in 2007 he had right thoracotomy & "bleb-ectomy";  After this procedure he was placed on Oxygen at 2L/min and BiPAP to use at night, along w/ ADVAIR250Bid & SPIRIVA daily, plus REVATIO20Tid for pulmonary HTN;  He was also treated w/ NEBS for about 1-33yrs then this was discontinued;  He has pretty much been on this same regimen for the past 9 yrs w/o much change, despite or maybe because he has moved around a lot- [place] (surg done there in 2007), to [place], back to [place], on to [place] for 6 yrs, then back to [place] over the last yr or so...  He describes himself as being rock-solid stable on this exact regimen since 2007- he had Cath (?left & right heart) in 2007, told 1 blockage, good LVF ?right heart results, started on O2, BiPAP, and Revatio but he does not know why?  He notes min cough when supine & in early AM attributed to reflux; min if any sput production, no hemoptysis, he denies SOB but states DOE "if I over-exert" eg- walking, lifting/carrying, stairs, etc; he notes that ADLs are ok- no problem (he is stoic);  He denies CP, palpit, f/c/s, edema... He hasn't been  to an ER since 2007 he says & that was also the last time he had any Pred; he thinks he had CXR, PFT, 2DEcho all earlier this yr...   Smoking Hx>  He is an ex-smoker, started in his teens, smoked for 30 yrs up to 1ppd, quit in 1986; This is a 30 pack-yr hx, he does not recall ever being checked for A1AT defic...  Pulmonary Hx>  COPD/ Emphysema w/ right upper lobe "bleb-ectomy) 2007 in [place]; he has chronic hypoxemic resp failure on O2 at 2L/min since 2007;  He tells me that he was started on BiPAP about that same time but he doesn't know why- never had sleep study, not on CPAP prev, he does not know about pCO2 levels etc ("I like the fresh cool air");  His BiPAP came from [facility] in [place]- states he does not know the settings, machine never downloaded, etc;  He has also been on Revatio20Tid since 2007, apparently never tried on other meds, dose never adjusted- he knows about "pulmonary hypertension" but he doesn't know any details and it doesn't appear to have been followed up, and meds kept the same from doctor to doctor...   Medical Hx>  HBP, ?nonobstructive CAD, HL, thyroid nodule, GERD, constipation, BPH, insomnia...  Family Hx>  Father died w/  Emphysema & was a former smoker; no other hx lung dis in the family; Alpha-1 status is unknown...  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   Current Meds>  Oxygen 2L/min pulse-dose concentrator, Advair250Bid, Spiriva daily, Revatio20Tid, CardizemCD240, Crestor10, Nexium20, Proscar5, Restoril30...  EXAM shows Afeb, VSS, O2sat=93% on 2L/min pulse-dose;  Heent- neg, mallampati1;  Chest- decr BS at bases, can't augment BS voluntarily, w/o w/r/r;  Heart- RR w/o m/r/g;  Abd- soft, neg;  Ext- neg w/o c/c/e;  Neuro- intact...  CXR 10/02/15 showed norm heart size,  COPD, bullous emphysema/ hyperinflation, scarring right apex, NAD...   Spirometry 10/02/15 showed FVC=2.70 (69%), FEV1=1.12 (37%), %1sec=41, mid-flows reduced at 18% predicted; this is c/w severe airflow obstruction & GOLD Stage 3 COPD  Ambulatory oxygen saturation test 10/02/15> on O2 at 2L/min pulse-dose concentrator: O2sat=96% on 2L at rest w/ pulse=87; he walked 2 laps w/ his O2, stopped due to dyspnea, lowest O2sat=89% w/ pulse=113/min...  LAB 10/02/15>  Alpha-1-Antitrypsin level => pending (he never went to the lab for this blood test)  2DEchocardiogram 10/09/15 showed norm LVF w/ EF=55-60%, norm wall motion, mild MR, mild RA dil, PAsys est 93mmHg... Pt on Revatio 20Tid x 59yrs & I rec we wean slowly (Decr to Bid now)...    IMP >>     COPD/ bullous emphysema> s/p RUL "bleb-ectomy" in 2007, severe airflow obstruction w/ GOLD Stage3 COPD> on Advair250Bid & Spiriva daily; apparently he has no use for a rescue inhaler; prev on NEB w/ Albut but not for several yrs.     Chronic hypoxemic respiratory failure on O2 at 2L/min via pulse-dose concentrator...    Pt reports using BiPAP since 2007, never been on CPAP, never had sleep study he says, unknown ABGs or pCO2 data; machine from [facility] we will try to get the old data => none received.    Hx pulmonary hypertension on Revatio20Tid since 2007 w/o additional med trials or dose adjustments> we do not have any of the objective data from his prev physician teams... Current 2DEcho w/ PAsys est 41mmHg & we will wean the Revatio to Bid at this point => he does not want to wean further for "other" reasons...    Medical issues include:  HBP, ?nonobstructive CAD, HL, thyroid nodule, GERD, BPH, insomnia... PLAN >>     Andrew [name] has a distinctly patchy history to go along w/ his severe airflow obstruction & bullous emphysema;  We really need his old objective data from 2007 when he was started on O2 & BiPAP after his RUL bleb reduction surg;  He will try to get  names and numbers for [place]- in the meanwhile we will contact his last physician in [facility] for their more recent data as we establish out data base here in [place];  He is very concerned that he wants [place] to continue his current regimen which has served him well over the last 47yrs;  Continue Advair250Bid, Spiriva daily, Revatio20Tid=>Bid, O2 at 2L/min, and the BiPAP nightly as currently set... We plan ROV recheck in 1 month... NOTE> 2DEcho w/ PAsys est ~8mmHg & we will slowly wean Revatio...  ~  November 01, 2015:  64mo ROV & Andrew [name] reports stable, doing satis & notes no untoward effects from cutting the Revatio to 20mg Bid; he denies CP, palpit, incr SOB, edema,  etc; he continues on the O2 at 2L/min, BiPAP from [place], [place] once daily; we have not received any records from his mult physicians ([place], [place], [place])...    EXAM shows Afeb, VSS, O2sat=92% on 2L/min pulse-dose;  Heent- neg, mallampati1;  Chest- decr BS at bases, can't augment BS voluntarily, w/o w/r/r;  Heart- RR w/o m/r/g;  Abd- soft, neg;  Ext- neg w/o c/c/e;  Neuro- intact... IMP/PLAN>>  See prob list above- he is stable on this regimen but does not want to wean the Revatio further for "other" reasons; OK to continue current med regimen- advised regular exercise vs pulm rehab program; given ZPak for prn use over the winter & he knows to call for any resp issues, incr dyspnea, etc... He remains on BiPAP but we do not have any data- no records received from prev pulm physicians, no notes from [place], no download data from his machine- we will again try to make contact w/ his DME company... We plan ROV in 3-39mo.  ~  Mar 18, 2016:  4-37mo ROV & post hosp visit>  Andrew [name] has severe COPD/Emphysema, GOLD Stage 3 w/ FEV1=1.12L (37%predicted) in [id number],  Hx right thoracotomy & "bleb-ectomy" in 2007;  after this procedure he says he was placed on Oxygen at 2L/min and BiPAP to use at night, along w/ ADVAIR250Bid, SPIRIVA  daily, and REVATIO20Tid for pulmonary HTN (2DEcho here 11/16 showed PAsys est=74mmHg)-- we do not have any of that data from [place] (we have been unsuccessful in obtaining old data from any of his prev physicians); he has been resistent to any adjustment in his medication regimen for various reasons...     He's been [place] x2 since last OV> 1st [facility] 2/9 - 01/03/16 by [facility] w/ CAP- CXR showed severe bullous emphysema, incr markings in RLL and atelectasis in R-mid lung, Temp 103, Lactate=2.4, WBC 16K; treated w/ O2, Solumedrol=>Pred, Zosyn/Vanco=>Levaquin, NEBs, etc; disch home w/ home health help- ?seen by his PCP after disch...    2nd [facility] 3/26 - 02/15/16 by [facility] w/ 1d hx incr SOB, wheezing, productive cough & felt to have a COPD exac; CXR showed his COPE/E, persistent incr markings in right base, WBC was elev at 20K, BNP=60, no pos cultures; he was treaed w/ O2, Solumedrol, Roceph/Zithro, NEBs, etc; he was disch on Levaquin & Pred; he developed urinary retention & foley placed (weaned off in NH); he was debilitated & sent to NH for rehab...     He was disch to [facility] for rehab & attended by [facility] note dated 03/01/16 reviewed-- finished Levaquin, weaned off the Pred, they were able to discontinue the foley & he passed voiding trial; disch home after 19d in rehab...     Now back home on same O2= 2L/min days & 3L/min night w/ BiPAP ?settings (he has been on this for yrs and never re-assessed), NEB w/ AlbutTid, Advair250Bid, Spiriva daily, Revatio20Bid (pt refuses to taper this med further);  He has developed pedal edema x3d & needs a low sodium diet + diuretic started but he tells me he is sched to see his PCP soon for this problem...    EXAM shows Afeb, VSS, O2sat=95% on 2L/min pulse-dose;  Heent- neg, mallampati1;  Chest- decr BS at bases, can't augment BS voluntarily, w/o w/r/r;  Heart- RR w/o m/r/g;  Abd- soft, neg;  Ext- neg w/o c/c/e;  Neuro- intact...  CXR 02/11/16 showed  hyperinflation, bullous emphysema, some scarring in right mid lung & both lower  lobes, no infiltrate, no edema...  LABS 01/2016> all reviewed in Epic... IMP/PLAN>>  Andrew [name] has had a protracted resp exac- triggered by prob RLL pneumonia (NOS) superimposed on his severe COPD/bullous emphysema;  Rec to change the Albut for Neb to [place] & continue treatments Tid; continue other meds regularly as outlined;  He is referred to [facility] & hopes to start soon;  We will plan ROV in 64mo sooner if needed.  ~  June 20, 2016:  55mo ROV w/ SN>  Andrew [name] returns for a 77mo ROV & states that he is doing very well- no new complaints or concerns at this time, his PCP is DrKoirala; He started [facility] in June & continues in this program at present; Pt prev inquired about treatments at "[facility]" for regenerative lung tissue (stem cell therapy) which costs ~$10K per treatment & all benefits are antecdotal; I offered to refer him to [facility] [place] [facility] if he wants cutting edge research approach...     EPIC records indicate ER visit 05/08/16 for Abd Pain> VSS, exam was neg, CT Abd showed large fecal burden otherw neg, distended urinary bladder, atherosclerosis, bilat L5 pars defects; Rec to take laxatives...     Severe COPD- GOLD Stage3, bullous emphysema, s/p resection of RUL bleb in 2007> on Advair250Bid & Spiriva daily; he has NEB w/ Duoneb for prn use; Spirometry 09/2015 w/ FEV1=1.12 (37%); he is enrolled in [place] & wants a POC instead of tanks;     Chronic hypoxemic respiratory failure on O2 at 2L/min via POC> ambulatory O2sats 09/2015 dropped to 89% on 2L/min after 2 Laps.    Hx prob hypercarbic resp failure inferred from Pt report of BiPAP ([facility]) since 2007, never been on CPAP, never had sleep study, unknown ABGs or pCO2 data>    Hx cor pulmonale/ pulmonary hypertension on Revatio20Tid since 2007 w/o additional med trials or dose adjustments> we do not have any of the objective data from his prev  physician teams; 2DEcho here 09/2015 showed PAsys=36 and we tried to wean his Revatio but he refused due to "other reasons", finally compromised on Revatio20Bid...    Hx CAP- Hosp 12/2015 w/ RLL opac (nos) & responded to broad spectrum Ab coverage + Sulomedrol, O2, Nebs, etc; readmitted 01/2016 w/ COPD exac- similar Rx but sent to NH for rehab at disch...    Cardiac issues>  ?nonobstructive CAD, cor pulmonale w/ mild pulmHTN & 2DEcho 09/2015 showing norm LVF w/ EF=55-60%, norm wall motion, mild MR, mild RA dil, PAsys est 25mmHg...    Medical issues include:  HBP (on CardizemCD240), HL (on Cres10), hx thyroid nodule, GERD (on Nexium40), Constipation, BPH (on Proscar5 & Flomax0.4) w/ hx urinary retention, insomnia (on Restoril30)... EXAM shows Afeb, VSS, O2sat=93% on 2L/min pulse-dose;  Heent- neg, mallampati1;  Chest- decr BS at bases, can't augment BS voluntarily, w/o w/r/r;  Heart- RR w/o m/r/g;  Abd- soft, neg;  Ext- neg w/o c/c/e;  Neuro- intact...  CXR 05/08/16>  Mod bullous emphysema w/ chr changes at the lung bases w/ pleuroparenchymal scarring, surg suture lines ove the right mid lung...  LABS 04/2016 in epic> Chems- ok, BS=149;  CBC- anemia w/ Hg= 10.4-11.7, WBC=15K IMP/PLAN>>  Andrew [name] is stable on his baseline regimen + pulm rehab, etc; rec to continue current meds and exercise; he needs the 2017 Flu vaccine when avail & will call prn any breathing problems; we plan ROV in 58mo...   ADDENDUM>>  Pt Hosp by [facility] 8/11 -  07/01/16 w/ CAP after presenting w/ incr SOB, decr O2sats at pulm rehab, & chills 9he lives w/ school aged grandkids)- CXR showed ?RLL opac, cultures neg & NOS- treated empirically w/ Rocephin & Zithromax, Solumedrol, O2, NEBS, etc; he improved gradually & disch home;  He went to see ENT 8/29 because he thought the pneumonia may have been from reflux & dysphagia- fiberoptic exam revealed some edema & pooling of secretions c/w LPR=> placed on PPI Bid and referred to GI;  Barium esophagram  showed a [place] & mod GERD but no esophagitis mass or stricture;  He saw DrSchooler & had EGD 08/23/16 showing a Yoe, patchy candida=> treated w/ Nystatin, later required Diflucan...   ADDENDUM>>  Pt had colonoscopy 08/23/16 by DrSchooler-- small HH, patchy candida, mild gastitis seen;  Treated w/ Nystatin, continue Prilosec40/d...   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      Severe COPD- GOLD Stage3, bullous emphysema, s/p resection of RUL bleb in 2007> on Advair250Bid & Spiriva daily; he has NEB w/ Duoneb for prn use; Spirometry 09/2015 w/ FEV1=1.12 (37%); he finished his PulmRehab 07/2016 & now exercises 5d/wk at the gym; he finally got his POC that he wanted...    Chronic hypoxemic respiratory failure on O2 at 2L/min via POC> ambulatory O2sats 09/2015 dropped to 89% on 2L/min after 2 Laps; repeat 11/2016 dropped to 85%after 1Lap...    Hx prob hypercarbic resp failure inferred from Pt report of BiPAP ([facility]) since 2007, never been on CPAP, never had sleep study, unknown ABGs or pCO2 data> we never received records from former physician teams or any of his old records...     Hx cor pulmonale/ pulmonary hypertension on Revatio20Tid since 2007 w/o additional med trials or dose adjustments> we do not have any of the objective data from his prev physician teams; 2DEcho here 09/2015 showed PAsys=36 and we tried to wean his Revatio but he refused due to "other reasons", finally compromised on Revatio20Bid...    Hx CAP-  Hosp 12/2015 w/ RLL opac (nos) & responded to broad spectrum Ab coverage + Solumedrol, O2, Nebs, etc; readmitted 01/2016 w/ COPD exac- similar Rx but sent to NH for rehab at disch...    Cardiac issues>  ?nonobstructive CAD, cor pulmonale w/ mild pulmHTN & 2DEcho 09/2015 showing norm LVF w/ EF=55-60%, norm wall motion, mild MR, mild RA dil, PAsys est 59mmHg...    Medical issues include:  HBP (on CardizemCD240), HL (on Cres10), hx thyroid nodule, GERD & prob LPR (on Nexium40), Constipation, BPH (on Proscar5 & Flomax0.4) w/ hx urinary retention, insomnia (on Restoril30)...       NOTE> pt is Iron deficient (see below) & needs to f/u w/ his PCP & GI for this...  EXAM shows Afeb, VSS, O2sat=92% on 3L/min;  Heent- neg, mallampati1;  Chest- decr BS at bases, can't augment BS voluntarily, w/o w/r/r;  Heart- RR w/o m/r/g;  Abd- soft, neg;  Ext-  neg w/o c/c/e;  Neuro- intact...  CXR 06/28/16>  Hyperinflation, marked emphysematous changes, crowed lung markings at the bases, no consolidation or effusions...  CXR 11/25/16>  COPD, bullous emphysema, and bilat pleuroparenchymal changes c/w scarring, no acute abnormality  Ambulatory O2sat on 3L/min pulse-dose POC>  O2sat=98% on 3L/min pulse-dose at rest w/ HR=90/min;  He ambulated only 1Lap (185') w/ O2sat drop to 85% w/ HR=108/min...  LABS 11/25/16>  Chems- wnl x BS=108;  A1c=6.1;  CBC- Hg=12.3, mcv=90, WBC=7.8K;  Fe=31 (6.6%sat) & Ferritin=8.7;  B12=661 IMP/PLAN>>  There is no such thing as a mild exac for Andrew [name]- even the mildest of URIs can cause a severe COPD exac- we discussed Rx w/ Levaquin500 x7d, Pred20mg - 5d taper (see AVS), + Diflucan100 per his request; hopefully he will respond but he has little in the way of reversible factors given his severe emphysema; reminded to go to the ER for [facility] admission if symptoms worsen despite therapy; as noted he is Fe defic & needs further eval per his PCP & GI- copies sent...  ~  December 23, 2016:  30mo ROV & post hospital  check>  Andrew [name] was [facility] 1/20 - 12/11/16 by Diona Browner w/ a COPD exac w/ acute on chronic resp failure;  Pt already on Home O2 at 2L/min and BiPAP Qhs; he had a root canal done, developed a sore throat, then cough, etc; Levaquin & Pred called -in for pt, Hosp for 4d w/ IV solumedrol & ultimately disch on Pred10... Currently c/o DOE w/ ADLs which is new to him as prev he was going to the gym etc...     He is currently taking Duoneb Q6H as needed + Advair250Bid, Spiriva via handihaler daily, Pred 10mg  tabs- slow taper... EXAM shows Afeb, VSS, O2sat=94% on 2L/min;  Heent- neg, mallampati1;  Chest- decr BS at bases, can't augment BS voluntarily, w/o w/r/r;  Heart- RR w/o m/r/g;  Abd- soft, neg;  Ext- neg w/o c/c/e;  Neuro- intact...  CXR 12/07/16 (independently reviewed by me in the PACS system)> norm heart size, calcif Ao, severe COPD w/ apical bullous changes & surg staples on right, NAD.Marland KitchenMarland Kitchen  EKG 12/07/16 showed NSR, rate 87, poor R prog V1-2, otherw WNL.Marland KitchenMarland Kitchen  LABS 11/2016> NOT retaining CO2 (ABG w/ pCO2=39;  Chems- ok;  CBC- ok w/ Hg=11-12 and wbc=21=>12;  BNP= wnl... IMP/PLAN>>  Andrew [name] had a COPD exac & required IV Solumedrol + in hosp attention w/ NEBS etc & now he is approaching his baseline; he called for an order for a Hosp bed but was referred to his PCP for this determination;  We reviewed the NEED for DUONEB Tid followed by Advair250Bid & Spiriva once daily; we will wean his Pred from 10mg /s to 5mg /d & plan ROV receheck in 6wks...  NOTE: >50% of this 49min rov was spent in counseling & coordination of care...  ~  January 28, 2017:  6wk Andrew [name] is stable on meds + Pred 5mg /d;  He called 01/15/17 for antibiotics for an infected tooth- called in Augmentin & tooth has been extracted by dentist;  He remains on NEBs w/ Duoneb Tid, followed by Advair250Bid and Spiriva daily;  He reports using his Revatio "prn";  His breathing continues to improve, back in the gym 3d/wk, approaching his baseline he says;  Denies  cough, sput, hemoptysis, CP, etc... We reviewed his prob list as above...  EXAM shows Afeb, VSS, O2sat=96% on 2L/min;  Heent- neg, mallampati1;  Chest- decr BS at bases, can't augment BS  voluntarily, w/o w/r/r;  Heart- RR w/o m/r/g;  Abd- soft, neg;  Ext- neg w/o c/c/e;  Neuro- intact... IMP/PLAN>>  Andrew [name] continues to improve & move toward his baseline; Rec to continue Pred5mg /d thru March then decr to 5mg  Qod til return visit in 67mo...  ~  May 01, 2017:  53mo ROV & Andrew [name] reports a good 96mo interval w/o acute exac and w/o new complaints or concerns; he is back in the gym 3d/wk & back to baseline he says;  As prev noted Andrew [name] is on BiPAP & has been for yrs- initiated by a pulm physician in [place] yrs ago & I presume this was done for COPD & CO2 retention ant NOT because of sleep apnea; he does not know his settings 7 we have been unable to get old records indicating his initial parameters or follow up monitoring; cureent problem is that his machine is old, mask is 78yr old, no tubing supplies in a long time;  For his part he says he does NOT rest well, just in short intervals;  He goes to bed at 11PM using his BiPAP; wakes at 3-4AM & can't get back to sleep; maybe eats breakfast at 4AM and drifts back to sleep (w/o BiPAP) until 7AM or so; similarly he might nap for 1H during the day (w/o BiPAP); he currently thinks that the BiPAP makes no difference in his sleep & it would just as well suit him to stop it if able...  WE DISCUSSED REASSESSMENT w/ ABG on RA, and a HOME SLEEP TEST on RA to evaluate for sleep apnea and hypercarbic/ hypoxemic resp failure...     [facility] does his Home O2 (2L/min continuous) and [facility] does his nursing care    Andrew [name] also tells ne he had a suspicious mole removed from his left post shoulder ~75mo ago; Bx= melanoma, then wider excision by Sherre Lain at the [facility]...     Severe COPD- GOLD Stage3, bullous emphysema, s/p resection of RUL bleb in 2007> on Advair250Bid & Spiriva  daily; he has NEB w/ Duoneb for prn use; he is off PRED now; Spirometry 09/2015 w/ FEV1=1.12 (37%); he finished his PulmRehab 07/2016 & now exercises regularly at the gym...    Chronic hypoxemic respiratory failure on O2 at 2L/min via POC> ambulatory O2sats 09/2015 dropped to 89% on 2L/min after 2 Laps; repeat 11/2016 dropped to 85% after 1Lap...    Hx prob hypercarbic resp failure inferred from Pt report of BiPAP ([facility]) since 2007, never been on CPAP, never had sleep study> we never received records from former physician teams or any of his old records; ABGs in [facility] 12/2015 pH=7.44/ pCO2=35/ pO2=69 ?on RA; 11/2016 pH=7.44/ pCO2=39/ pO2=146 on 4Lnc;   => he has agreed to re-assessment here 2018 w/ ABG on RA and Home Sleep Study => pending...    Hx cor pulmonale/ pulmonary hypertension on Revatio20Tid since 2007 w/o additional med trials or dose adjustments> we do not have any of the objective data from his prev physician teams; 2DEcho here 09/2015 showed PAsys=36 and we tried to wean his Revatio but he refused due to "other reasons", finally compromised on Revatio20Bid...    Hx CAP- Hosp 12/2015 w/ RLL opac (nos) & responded to broad spectrum Ab coverage + Solumedrol, O2, Nebs, etc; readmitted 01/2016 w/ COPD exac- similar Rx but sent to NH for rehab at disch...    Cardiac issues> no cardiologist> ?nonobstructive CAD, cor pulmonale w/ mild pulmHTN & 2DEcho 09/2015 showing norm LVF w/  EF=55-60%, norm wall motion, mild MR, mild RA dil, PAsys est 51mmHg...    Medical issues> PCP= Dr. Lauretta Grill Koirala ([facility])>  HBP (on CardizemCD240), HL (on Cres10), hx thyroid nodule, GERD & prob LPR (GI= DrSchooler, on Prilosec40), Constipation, BPH (on Proscar5 & Flomax0.4) w/ hx urinary retention, insomnia (on Restoril30)...       NOTE> pt is Iron deficient & needs to f/u w/ his PCP & GI for this>> LABS 11/25/16>  Chems- wnl x BS=108;  A1c=6.1;  CBC- Hg=12.3, mcv=90, WBC=7.8K;  Fe=31 (6.6%sat) & Ferritin=8.7;   B12=661 EXAM shows Afeb, VSS, O2sat=90% on 2L/min pulse dose;  Heent- neg, mallampati1;  Chest- decr BS at bases, can't augment BS voluntarily, w/o w/r/r;  Heart- RR w/o m/r/g;  Abd- soft, neg;  Ext- neg w/o c/c/e;  Neuro- intact...  ABG on RA> pending (not done)  Home Sleep Test> pending (see below) IMP/PLAN>>  Andrew [name] has agreed to proceed w/ home sleep test to r/o OSA and we will further assess w/ ABG on RA to check for CO2 retention and any indication for BiPAP (doubt he will qualify at this time);  He will continue w/ his O2, Advair250, Spiriva, Duoneb vs ProventilHFA which he uses prn;  He does not appear to need the Revatio either but declines to discontinue this medication for other reasons.  ADDENDUM>>  Home Sleep Test done 05/20/17>  4H study showed mild OSA w/ AHI=6.0/hr;  Study done on RA-- lowest O2sat was 74% w/ an ave of 87%;  Pt given the option for in-lab CPAP titration study vs trial CPAP w/ autoset & a mask fit session w/ Lynnae Sandhoff.  He prefers the latter & states he doesn't believe that he has OSA => we will arrange a mask fit session, the order a ResMed S10, air/ auto- set 5/15, w/ heated humidity, cliamate controlled tubing, enroll in airview w/ ROV in 6-8wks after starting for face to face and download... NOTE: he had the mask fit session w/ new FFM & O2 bleed-in at 3L/min; he was not willing to try the CPAP on Auto, says he requires BiPAP & therefore will need an in-lab CPAP/BiPAP titration test...   ~  July 31, 2017:  73mo ROV & Andrew [name] had a Home Sleep Test 05/20/17- as above w/ AHI=6/hr & O2desat to 74% w/ ave 87%;  As noted he has been on BiPAP x yrs originally prescribed by MD in [place]- we have been unable to obtain any old records relating to his prev Dx of OSA & his need for BiPAP;  It is indeed unfortunate that he did not travel w/ his old records when he left [place] for [place], then back to [place], then on to 2 places in [place]; He tells me that he was INTOL to CPAP when tried  in the past;  The prob is that his old machine is wearing out & he is in need of a new machine- he is unable to afford one on his own;  I explained Medicare's rules for CPAP/ BiPAP & how he must follow their edicts about the repeat eval, and eventually the requirement for a download to prove compliance & efficacy-- in essence this means that he MUST have an in lab CPAP/BiPAP titration test, prove INTOL to CPAP to get the BiPAP then determine the BiPAP settings best for him;  This process is followed by a face to face visit in ~15mo for download purposes...    NOTE: his PCP is DrKoirala Programmer, multimedia) & his GI  is Systems developer ([facility]); Pt saw ENT- Nordbladh,PA at [facility] on 06/24/17> c/o worsening dysphagia & nasal congestion, long hx GERD on PPI therapy, larynx exam showed polypoid changes of the cords, prom cricopharyngeus, and postglottic edema; prev EGD w/ smallHH, mod GERD; repeat laryngoscopy was essent neg- they ordered a Ba swallow, done 06/30/17 & showed sl retention in the valleculae that cleared w/ repeat swalloing, 60mm brium tablet got lodged in the distal esoph & did not pass into the stomach; He again needs attention from his gastroenterologist for dilatation...  EXAM shows Afeb, VSS, O2sat=90% on 2L/min pulse dose;  Heent- neg, mallampati1;  Chest- decr BS at bases, can't augment BS voluntarily, w/o w/r/r;  Heart- RR w/o m/r/g;  Abd- soft, neg;  Ext- neg w/o c/c/e;  Neuro- intact... IMP/PLAN>>  As explained above we will order an in-lab CPAP/BiPAP titration study, and proceed from there...    Past Medical History:  Diagnosis Date  . BiPAP (biphasic positive airway pressure) dependence    Pt denies history of OSA  . BPH (benign prostatic hyperplasia)   . Cancer ([place])    skin  . COPD (chronic obstructive pulmonary disease) ([place])   . Dysphagia   . Emphysema lung ([place])   . GERD (gastroesophageal reflux disease)    " Silent reflux"  . Hearing loss    right ear  . HLD (hyperlipidemia)    . Hypertension   . Oxygen dependent    2-3 liters  . Oxygen dependent   . Pneumonia   . Pulmonary hypertension ([facility])   Medical Hx>  HBP, ?nonobstructive CAD, HL, thyroid nodule, GERD, constipation, BPH, insomnia... Meds include>  CardizemCD240, Crestor10, Nexium20, Miralax, Proscar10, Restoril30...   Past Surgical History:  Procedure Laterality Date  . CARDIAC CATHETERIZATION    . CATARACT EXTRACTION W/ INTRAOCULAR LENS  IMPLANT, BILATERAL    . ESOPHAGOGASTRODUODENOSCOPY (EGD) WITH PROPOFOL N/A 08/23/2016   Procedure: ESOPHAGOGASTRODUODENOSCOPY (EGD) WITH PROPOFOL;  Surgeon: Wilford Corner, MD;  Location: [facility] ENDOSCOPY;  Service: Endoscopy;  Laterality: N/A;  . LUNG SURGERY    . TONSILLECTOMY    Hx Thoracotomy & RUL Bleb-ectomy 2007 in [place], [place]...   Outpatient Encounter Prescriptions as of 05/01/2017  Medication Sig  . ADVAIR DISKUS 250-50 MCG/DOSE AEPB Inhale 1 puff into the lungs 2 (two) times daily.  Marland Kitchen albuterol (PROVENTIL HFA;VENTOLIN HFA) 108 (90 Base) MCG/ACT inhaler Inhale 2 puffs into the lungs every 6 (six) hours as needed for wheezing or shortness of breath.  . diltiazem (CARTIA XT) 240 MG 24 hr capsule Take 240 mg by mouth at bedtime.  . finasteride (PROSCAR) 5 MG tablet Take 5 mg by mouth at bedtime.   Marland Kitchen ibuprofen (ADVIL,MOTRIN) 200 MG tablet Take 200 mg by mouth every 6 (six) hours as needed for moderate pain.  Marland Kitchen ipratropium-albuterol (DUONEB) 0.5-2.5 (3) MG/3ML SOLN Take 3 mLs by nebulization every 6 (six) hours as needed. Dx: J44.9 (Patient taking differently: Take 3 mLs by nebulization every 6 (six) hours as needed (shortness of breath/ wheezing). Dx: J44.9)  . liver oil-zinc oxide (DESITIN) 40 % ointment Apply 1 application topically See admin instructions. Apply to left butt cheek for wound care approximately every 4 hours while awake  . Multiple Vitamins-Minerals (MULTIVITAMIN WITH MINERALS) tablet Take 1 tablet by mouth daily.  Marland Kitchen omeprazole (PRILOSEC) 40 MG  capsule Take 40 mg by mouth daily.  Marland Kitchen OVER THE COUNTER MEDICATION Apply 1 application topically See admin instructions. Protective ointment from home health care - apply approximately every 4 hours  to left butt cheek for wound care  . OXYGEN Inhale 2-3 L into the lungs continuous. 2L daytime, 3L at night  . oxymetazoline (AFRIN) 0.05 % nasal spray Place 1 spray into both nostrils daily as needed for congestion.  . polyethylene glycol (MIRALAX / GLYCOLAX) packet Take 17 g by mouth daily. (Patient taking differently: Take 17 g by mouth at bedtime. Mix in 8 oz liquid and drink)  . rosuvastatin (CRESTOR) 10 MG tablet Take 10 mg by mouth daily.  . sildenafil (REVATIO) 20 MG tablet Take 1 tablet (20 mg total) by mouth 2 (two) times daily.  Marland Kitchen SPIRIVA HANDIHALER 18 MCG inhalation capsule INHALE THE CONTENTS OF 1 CAPSULE EVERY DAY (Patient taking differently: INHALE THE CONTENTS OF 1 CAPSULE EVERY DAY AT NOON)  . tamsulosin (FLOMAX) 0.4 MG CAPS capsule Take 1 capsule (0.4 mg total) by mouth daily after breakfast. (Patient taking differently: Take 0.4 mg by mouth at bedtime. )  . temazepam (RESTORIL) 30 MG capsule Take 30 mg by mouth at bedtime.  . vitamin C (ASCORBIC ACID) 500 MG tablet Take 500 mg by mouth daily.  Marland Kitchen VITAMIN E PO Take 1 capsule by mouth daily.  . predniSONE (DELTASONE) 10 MG tablet OFF now    No Known Allergies   Immunization History  Administered Date(s) Administered  . Influenza, High Dose Seasonal PF 07/22/2016  . Influenza-Unspecified 09/19/2015  . PPD Test 02/15/2016  . Pneumococcal Conjugate-13 09/19/2015    Current Medications, Allergies, Past Medical History, Past Surgical History, Family History, and Social History were reviewed in [employer] record.   Review of Systems             All symptoms NEG except where BOLDED >>  Constitutional:  F/C/S, fatigue, anorexia, unexpected weight change. HEENT:  HA, visual changes, hearing loss, earache,  nasal symptoms, sore throat, mouth sores, hoarseness. Resp:  cough, sputum, hemoptysis; SOB, tightness, wheezing. Cardio:  CP, palpit, DOE, orthopnea, edema. GI:  N/V/D/C, blood in stool; reflux, abd pain, distention, gas. GU:  dysuria, freq, urgency, hematuria, flank pain, voiding difficulty. MS:  joint pain, swelling, tenderness, decr ROM; neck pain, back pain, etc. Neuro:  HA, tremors, seizures, dizziness, syncope, weakness, numbness, gait abn. Skin:  suspicious lesions or skin rash. Heme:  adenopathy, bruising, bleeding. Psyche:  confusion, agitation, sleep disturbance, hallucinations, anxiety, depression suicidal.   Objective:   Physical Exam       Vital Signs:  Reviewed...   General:  WD, WN, 76 y/o WM in NAD; alert & oriented; pleasant & cooperative... HEENT:  [place]/AT; Conjunctiva- pink, Sclera- nonicteric, EOM-wnl, PERRLA, EACs-clear, TMs-wnl; NOSE-clear; THROAT-clear & wnl.  Neck:  Supple w/ fair ROM; no JVD; normal carotid impulses w/o bruits; no thyromegaly +small nodule palpated; no lymphadenopathy.  Chest:  Overinflated, resonant percussion note, decr BS bilat & can't augment BS voluntarily, no w/r/r heard... Heart:  Regular Rhythm; norm S1 & S2 without murmurs, rubs, or gallops detected. Abdomen:  Soft & nontender- no guarding or rebound; normal bowel sounds; no organomegaly or masses palpated. Ext:  decr ROM; without deformities or arthritic changes; no varicose veins, +venous insuffic, no edema;  Pulses intact w/o bruits. Neuro:  No focal neuro deficits; sensory testing normal; gait normal & balance OK. Derm:  No lesions noted; no rash etc. Lymph:  No cervical, supraclavicular, axillary, or inguinal adenopathy palpated.   Assessment:      IMP >>     Severe COPD- GOLD Stage3, bullous emphysema, s/p resection of RUL  bleb in 2007> on Advair250Bid & Spiriva daily; he has NEB w/ Duoneb for prn use; Spirometry 09/2015 w/ FEV1=1.12 (37%); he has completed pulm rehab & now  exercises at the gym 5d/wk;  He's had mult COPD exac => treat w/ Levaquin, Pred taper, continue Advair/ spiriva/ NEBS/ O2...    Chronic hypoxemic respiratory failure on O2 at 2L/min via POC> ambulatory O2sats 09/2015 dropped to 89% on 2L/min after 2 Laps; he dropped to 85% after 1Lap on 3L/min POC 11/2016...    Hx prob hypercarbic resp failure inferred from Pt report of BiPAP ([facility]) since 2007, never been on CPAP, never had sleep study, unknown ABGs or pCO2 data> we never received data from his prev physicians.    Hx cor pulmonale/ pulmonary hypertension on Revatio20Tid since 2007 w/o additional med trials or dose adjustments> we do not have any of the objective data from his prev physician teams; 2DEcho here 09/2015 showed PAsys=36 and we tried to wean his Revatio but he refused due to "other reasons", finally compromised on Revatio20Bid...    Hx CAP- Hosp 12/2015 w/ RLL opac (nos) & responded to broad spectrum Ab coverage + Sulomedrol, O2, Nebs, etc; readmitted 01/2016 w/ COPD exac- similar Rx but sent to NH for rehab at disch...    Hx of being on Home BiPAP since 2007 w/ eval & set up in [place]> we have been unable to obtain any old records regarding his initial studies and BiPAP set up...    Cardiac issues>  ?nonobstructive CAD, cor pulmonale w/ mild pulmHTN & 2DEcho 09/2015 showing norm LVF w/ EF=55-60%, norm wall motion, mild MR, mild RA dil, PAsys est 11mmHg...    Medical issues include:  HBP (on CardizemCD240), HL (on Cres10), hx thyroid nodule, GERD (on Nexium40), constipation, BPH (on Proscar5 & Flomax0.4) w/ hx urinary retention, insomnia (on Restoril30)...       NOTE>  He is Iron deficient & need further eval & Rx from his PCP & GI...  PLAN >>  10/02/15>   Donley has a distinctly patchy history to go along w/ his severe airflow obstruction & bullous emphysema;  We really need his old objective data from 2007 when he was started on O2 & BiPAP after his RUL bleb reduction surg;  He will try to  get names and numbers for [place]- in the meanwhile we will contact his last physician in [facility] for their more recent data as we establish out data base here in [place];  He is very concerned that he wants [place] to continue his current regimen which has served him well over the last 11yrs;  Continue Advair250Bid, Spiriva daily, Revatio20tid, O2 at 2L/min, and the BiPAP nightly as currently set...  11/01/15>  See prob list above- he is stable on this regimen but does not want to wean the Revatio further for "other" reasons; OK to continue current med regimen- advised regular exercise vs pulm rehab program; given ZPak for prn use over the winter & he knows to call for any resp issues, incr dyspnea, etc... He remains on BiPAP but we do not have any data- no records received from prev pulm physicians, no notes from [place], no download data from his machine- we will again try to make contact w/ his DME company... 03/18/16>   Andrew [name] has had a protracted resp [place] x2 this spring- triggered by prob RLL pneumonia (NOS) superimposed on his severe COPD/bullous emphysema;  Rec to change the Albut for Neb to [place] & continue  treatments Tid; continue other meds regularly as outlined;  He is referred to [facility] & hopes to start soon;  We will plan ROV in 67mo sooner if needed. 06/20/16>   Andrew [name] is stable on his baseline regimen + pulm rehab, etc; rec to continue current meds and exercise; he needs the 2017 Flu vaccine when avail & will call prn any breathing problems; we plan ROV in 72mo...  11/25/16>   There is no such thing as a mild exac for Andrew [name]- even the mildest of URIs can cause a severe COPD exac- we discussed Rx w/ Levaquin500 x7d, Pred20mg - 5d taper (see AVS), + Diflucan100 per his request; hopefully he will respond but he has little in the way of reversible factors given his severe emphysema; reminded to go to the ER for [facility] admission if symptoms worsen despite therapy; as noted he is Fe defic & needs  further eval per his PCP & GI- copies sent. => subseq Hosp for 4d w/ IV Solumedrol, NEBs/ O2/ etc... 05/01/17>   Andrew [name] has agreed to proceed w/ home sleep test to r/o OSA and we will further assess w/ ABG on RA to check for CO2 retention and any indication for BiPAP (doubt he will qualify at this time);  He will continue w/ his O2, Advair250, Spiriva, Duoneb vs ProventilHFA which he uses prn;  He does not appear to need the Revatio either but declines to discontinue this medication for other reasons; 07/31/17>   As explained above we will order an in-lab CPAP/BiPAP titration study, and proceed from there   Plan:     Patient's Medications  New Prescriptions   No medications on file  Previous Medications   ADVAIR DISKUS 250-50 MCG/DOSE AEPB    Inhale 1 puff into the lungs 2 (two) times daily.   ALBUTEROL (PROVENTIL HFA;VENTOLIN HFA) 108 (90 BASE) MCG/ACT INHALER    Inhale 2 puffs into the lungs every 6 (six) hours as needed for wheezing or shortness of breath.   AZITHROMYCIN (ZITHROMAX) 250 MG TABLET    Take as per package directions   DILTIAZEM (CARTIA XT) 240 MG 24 HR CAPSULE    Take 240 mg by mouth at bedtime.   FINASTERIDE (PROSCAR) 5 MG TABLET    Take 5 mg by mouth at bedtime.    IBUPROFEN (ADVIL,MOTRIN) 200 MG TABLET    Take 200 mg by mouth every 6 (six) hours as needed for moderate pain.   IPRATROPIUM-ALBUTEROL (DUONEB) 0.5-2.5 (3) MG/3ML SOLN    INHALE 1 VIAL VIA NEBULAZER EVERY 6 HOURS AS NEEDED   METHYLPREDNISOLONE (MEDROL) 4 MG TBPK TABLET    Take as directed per package instructions   MULTIPLE VITAMINS-MINERALS (MULTIVITAMIN WITH MINERALS) TABLET    Take 1 tablet by mouth daily.   OMEPRAZOLE (PRILOSEC) 40 MG CAPSULE    Take 40 mg by mouth daily.   OVER THE COUNTER MEDICATION    Apply 1 application topically See admin instructions. Protective ointment from home health care - apply approximately every 4 hours to left butt cheek for wound care   OXYGEN    Inhale 2-3 L into the lungs  continuous. 2L daytime, 3L at night   POLYETHYLENE GLYCOL (MIRALAX / GLYCOLAX) PACKET    Take 17 g by mouth daily.   ROSUVASTATIN (CRESTOR) 10 MG TABLET    Take 10 mg by mouth daily.   SILDENAFIL (REVATIO) 20 MG TABLET    Take 1 tablet (20 mg total) by mouth 2 (two) times daily.  SPIRIVA HANDIHALER 18 MCG INHALATION CAPSULE    INHALE THE CONTENTS OF 1 CAPSULE EVERY DAY   TAMSULOSIN (FLOMAX) 0.4 MG CAPS CAPSULE    Take 1 capsule (0.4 mg total) by mouth daily after breakfast.   TEMAZEPAM (RESTORIL) 30 MG CAPSULE    Take 30 mg by mouth at bedtime.   VITAMIN C (ASCORBIC ACID) 500 MG TABLET    Take 500 mg by mouth daily.   VITAMIN E PO    Take 1 capsule by mouth daily.  Modified Medications   No medications on file  Discontinued Medications   No medications on file

## 2017-08-06 IMAGING — CR DG ABD PORTABLE 1V
2 series · 2 of 2 positions shown · non-contrast
Comparison: None.

CLINICAL DATA: Abdominal pain

EXAM:
PORTABLE ABDOMEN - 1 VIEW

[AP (1 of 2)]
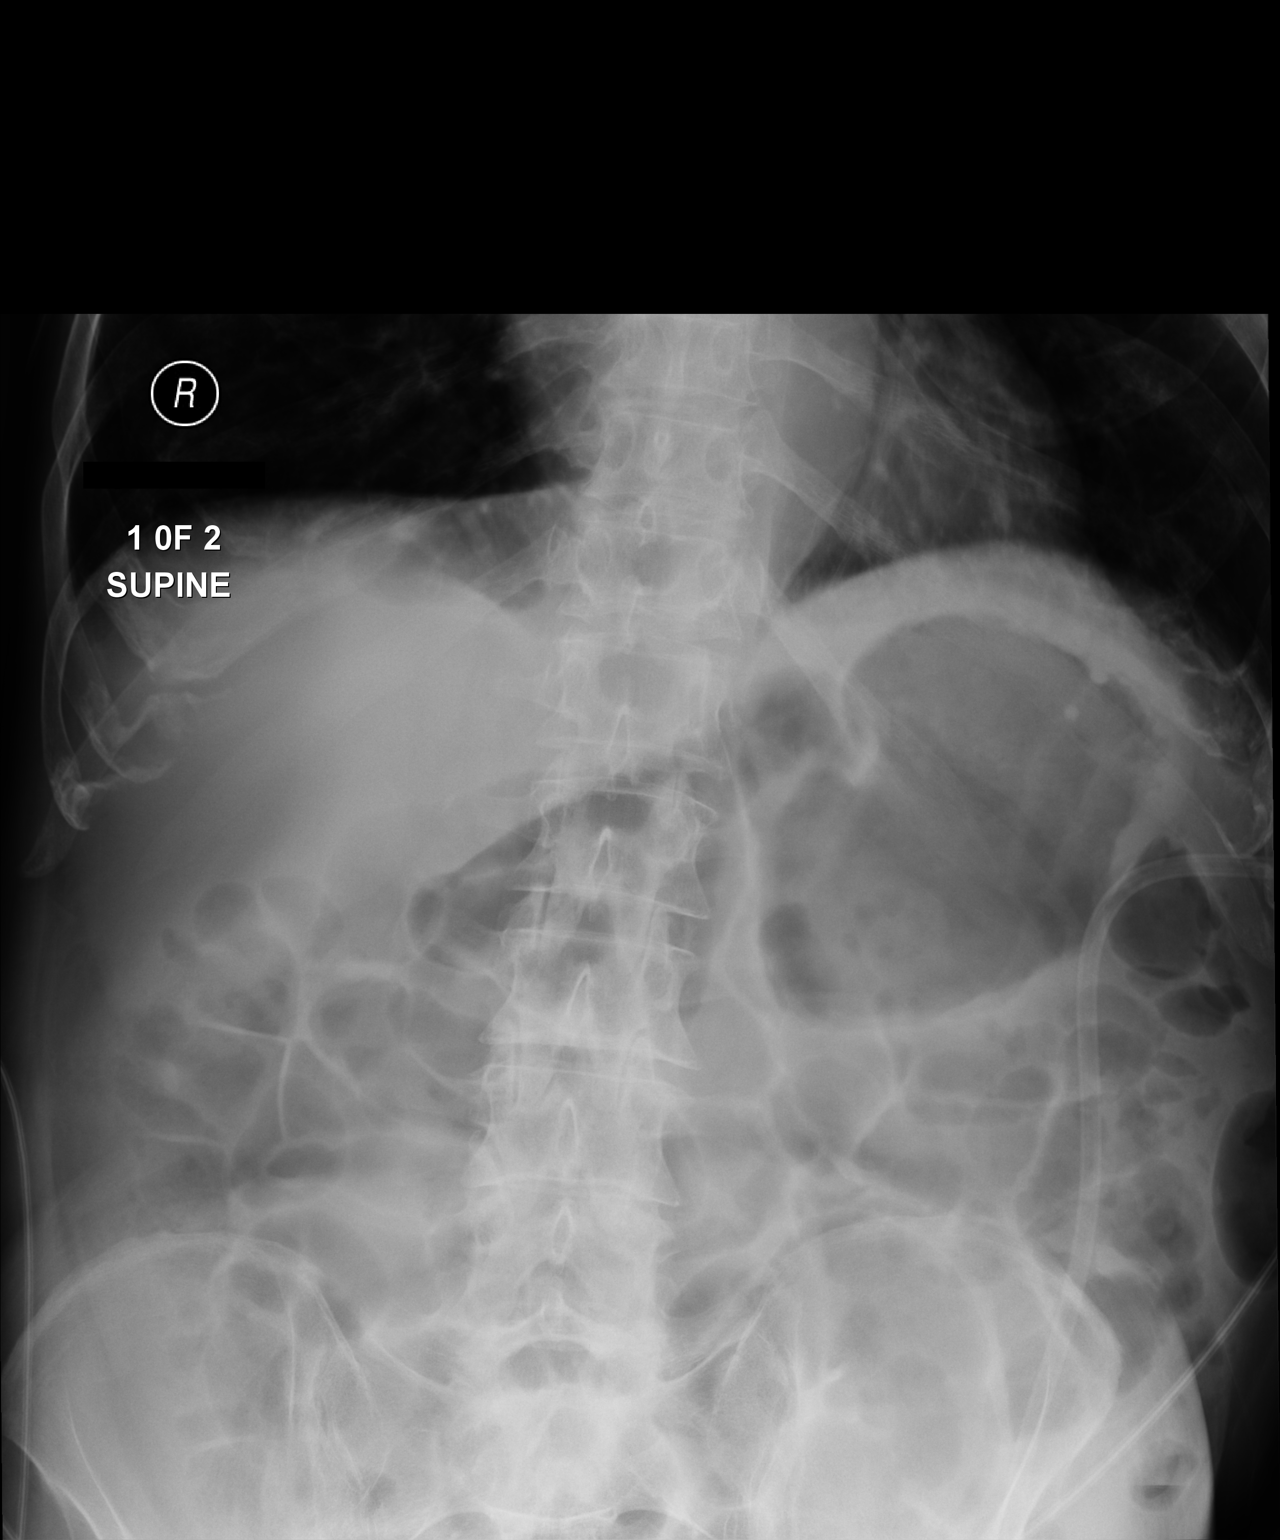

[AP (2 of 2)]
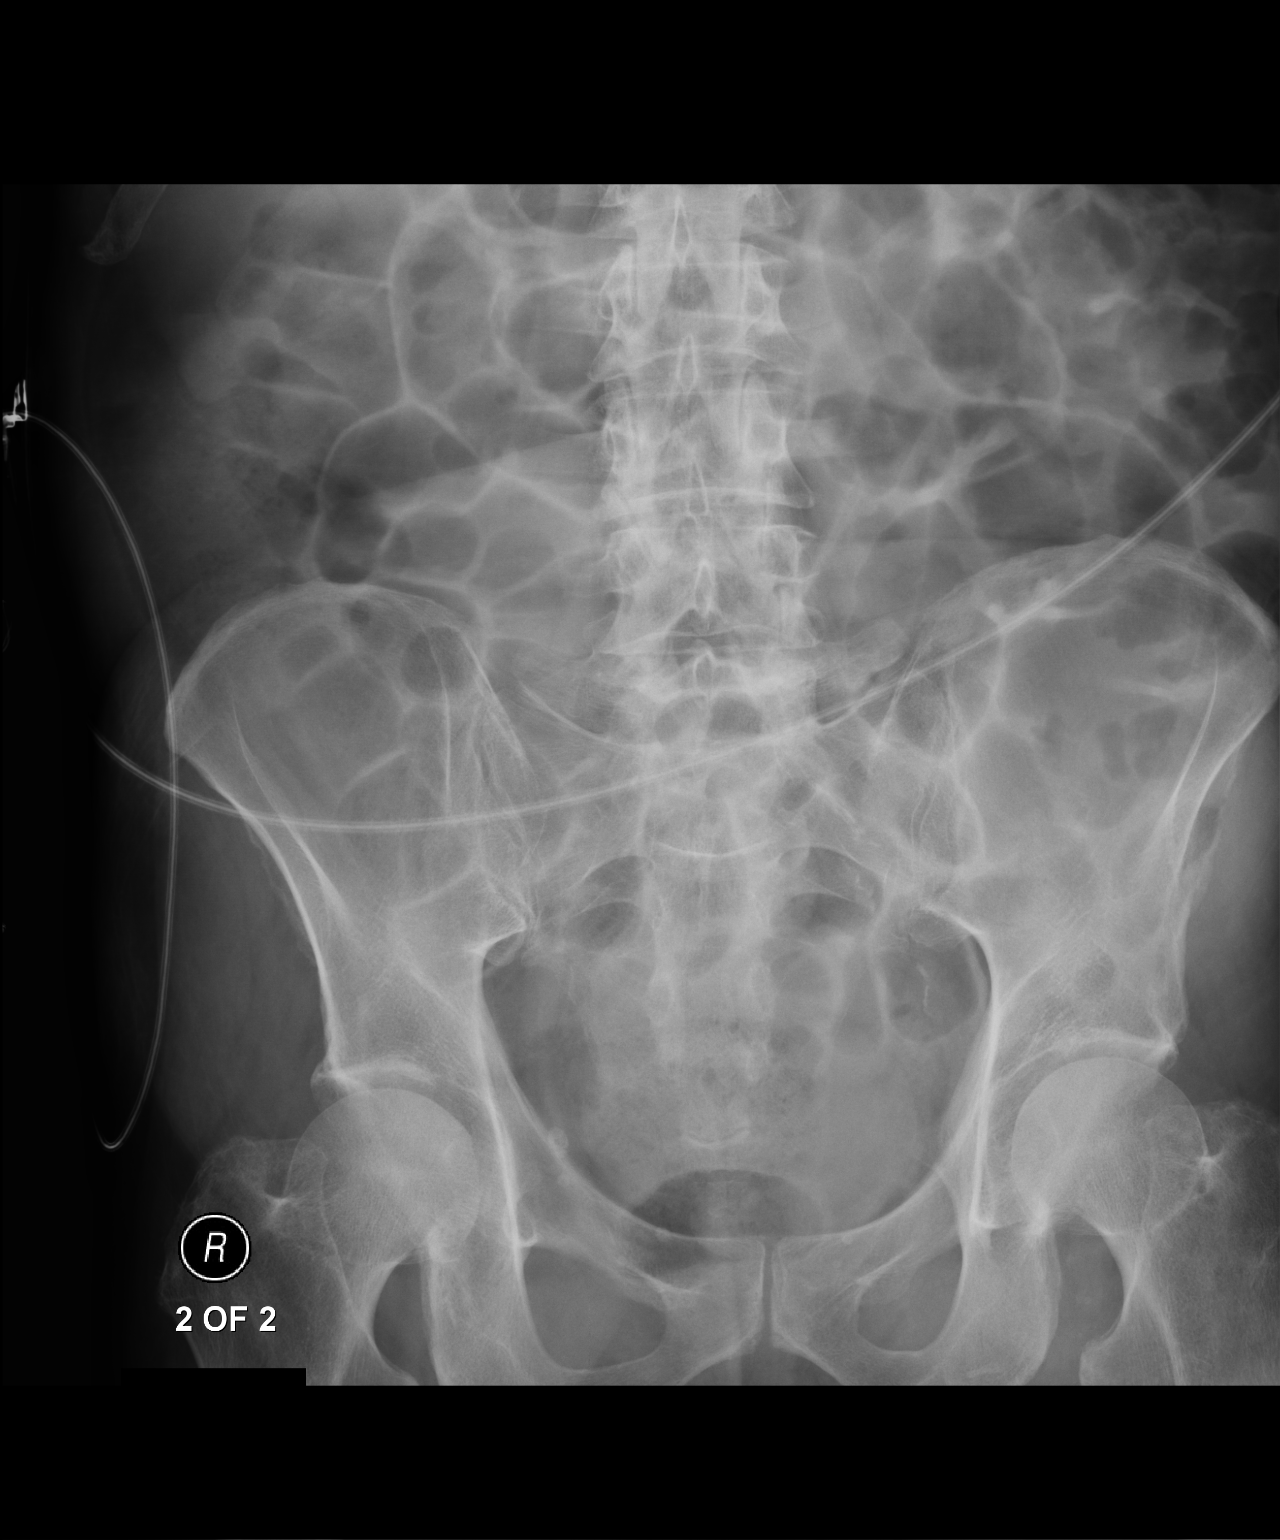

[2 of 2 positions shown; findings below may reference images not displayed]

FINDINGS: Scattered large and small bowel gas is noted. No obstructive changes
are seen. No free air is noted. No acute bony abnormality is seen.
IMPRESSION: No acute abnormality noted.

## 2017-08-07 DIAGNOSIS — Z23 Encounter for immunization: Secondary | ICD-10-CM | POA: Diagnosis not present

## 2017-08-18 ENCOUNTER — Other Ambulatory Visit: Payer: Self-pay | Admitting: Pulmonary Disease

## 2017-08-18 DIAGNOSIS — D485 Neoplasm of uncertain behavior of skin: Secondary | ICD-10-CM | POA: Diagnosis not present

## 2017-08-18 DIAGNOSIS — G4733 Obstructive sleep apnea (adult) (pediatric): Secondary | ICD-10-CM

## 2017-08-18 DIAGNOSIS — J9611 Chronic respiratory failure with hypoxia: Secondary | ICD-10-CM

## 2017-08-18 DIAGNOSIS — J441 Chronic obstructive pulmonary disease with (acute) exacerbation: Secondary | ICD-10-CM

## 2017-08-18 DIAGNOSIS — J432 Centrilobular emphysema: Secondary | ICD-10-CM

## 2017-08-18 DIAGNOSIS — I272 Pulmonary hypertension, unspecified: Secondary | ICD-10-CM

## 2017-08-18 DIAGNOSIS — L905 Scar conditions and fibrosis of skin: Secondary | ICD-10-CM | POA: Diagnosis not present

## 2017-09-04 ENCOUNTER — Ambulatory Visit (HOSPITAL_BASED_OUTPATIENT_CLINIC_OR_DEPARTMENT_OTHER): Payer: Medicare Other | Attending: Pulmonary Disease | Admitting: Pulmonary Disease

## 2017-09-04 VITALS — Ht 67.0 in | Wt 140.0 lb

## 2017-09-04 DIAGNOSIS — J9611 Chronic respiratory failure with hypoxia: Secondary | ICD-10-CM | POA: Diagnosis not present

## 2017-09-04 DIAGNOSIS — G4733 Obstructive sleep apnea (adult) (pediatric): Secondary | ICD-10-CM | POA: Insufficient documentation

## 2017-09-04 DIAGNOSIS — J449 Chronic obstructive pulmonary disease, unspecified: Secondary | ICD-10-CM

## 2017-09-04 DIAGNOSIS — Z9989 Dependence on other enabling machines and devices: Secondary | ICD-10-CM

## 2017-09-09 ENCOUNTER — Inpatient Hospital Stay (HOSPITAL_COMMUNITY)
Admission: EM | Admit: 2017-09-09 | Discharge: 2017-09-12 | DRG: 871 | Disposition: A | Discharge status: Home / Self Care | Payer: Medicare Other | Attending: Internal Medicine | Admitting: Internal Medicine

## 2017-09-09 ENCOUNTER — Emergency Department (HOSPITAL_COMMUNITY): Payer: Medicare Other

## 2017-09-09 ENCOUNTER — Encounter (HOSPITAL_COMMUNITY): Payer: Self-pay

## 2017-09-09 DIAGNOSIS — Z85828 Personal history of other malignant neoplasm of skin: Secondary | ICD-10-CM

## 2017-09-09 DIAGNOSIS — Z79899 Other long term (current) drug therapy: Secondary | ICD-10-CM | POA: Diagnosis not present

## 2017-09-09 DIAGNOSIS — E785 Hyperlipidemia, unspecified: Secondary | ICD-10-CM | POA: Diagnosis not present

## 2017-09-09 DIAGNOSIS — H9191 Unspecified hearing loss, right ear: Secondary | ICD-10-CM | POA: Diagnosis present

## 2017-09-09 DIAGNOSIS — R000 Tachycardia, unspecified: Secondary | ICD-10-CM | POA: Diagnosis present

## 2017-09-09 DIAGNOSIS — J441 Chronic obstructive pulmonary disease with (acute) exacerbation: Secondary | ICD-10-CM | POA: Diagnosis not present

## 2017-09-09 DIAGNOSIS — R0682 Tachypnea, not elsewhere classified: Secondary | ICD-10-CM | POA: Diagnosis present

## 2017-09-09 DIAGNOSIS — I480 Paroxysmal atrial fibrillation: Secondary | ICD-10-CM

## 2017-09-09 DIAGNOSIS — Z9841 Cataract extraction status, right eye: Secondary | ICD-10-CM

## 2017-09-09 DIAGNOSIS — Z808 Family history of malignant neoplasm of other organs or systems: Secondary | ICD-10-CM

## 2017-09-09 DIAGNOSIS — J181 Lobar pneumonia, unspecified organism: Secondary | ICD-10-CM | POA: Diagnosis not present

## 2017-09-09 DIAGNOSIS — I4891 Unspecified atrial fibrillation: Secondary | ICD-10-CM | POA: Diagnosis not present

## 2017-09-09 DIAGNOSIS — Z7951 Long term (current) use of inhaled steroids: Secondary | ICD-10-CM

## 2017-09-09 DIAGNOSIS — Z9981 Dependence on supplemental oxygen: Secondary | ICD-10-CM

## 2017-09-09 DIAGNOSIS — J9611 Chronic respiratory failure with hypoxia: Secondary | ICD-10-CM

## 2017-09-09 DIAGNOSIS — R509 Fever, unspecified: Secondary | ICD-10-CM | POA: Diagnosis not present

## 2017-09-09 DIAGNOSIS — Z9842 Cataract extraction status, left eye: Secondary | ICD-10-CM

## 2017-09-09 DIAGNOSIS — N400 Enlarged prostate without lower urinary tract symptoms: Secondary | ICD-10-CM | POA: Diagnosis present

## 2017-09-09 DIAGNOSIS — Z9889 Other specified postprocedural states: Secondary | ICD-10-CM | POA: Diagnosis not present

## 2017-09-09 DIAGNOSIS — J440 Chronic obstructive pulmonary disease with acute lower respiratory infection: Secondary | ICD-10-CM | POA: Diagnosis present

## 2017-09-09 DIAGNOSIS — J90 Pleural effusion, not elsewhere classified: Secondary | ICD-10-CM | POA: Diagnosis present

## 2017-09-09 DIAGNOSIS — G4733 Obstructive sleep apnea (adult) (pediatric): Secondary | ICD-10-CM

## 2017-09-09 DIAGNOSIS — E440 Moderate protein-calorie malnutrition: Secondary | ICD-10-CM | POA: Insufficient documentation

## 2017-09-09 DIAGNOSIS — Z8679 Personal history of other diseases of the circulatory system: Secondary | ICD-10-CM

## 2017-09-09 DIAGNOSIS — J9621 Acute and chronic respiratory failure with hypoxia: Secondary | ICD-10-CM | POA: Diagnosis not present

## 2017-09-09 DIAGNOSIS — A419 Sepsis, unspecified organism: Secondary | ICD-10-CM | POA: Diagnosis not present

## 2017-09-09 DIAGNOSIS — I10 Essential (primary) hypertension: Secondary | ICD-10-CM | POA: Diagnosis present

## 2017-09-09 DIAGNOSIS — K219 Gastro-esophageal reflux disease without esophagitis: Secondary | ICD-10-CM | POA: Diagnosis present

## 2017-09-09 DIAGNOSIS — Z87891 Personal history of nicotine dependence: Secondary | ICD-10-CM | POA: Diagnosis not present

## 2017-09-09 DIAGNOSIS — Z6820 Body mass index (BMI) 20.0-20.9, adult: Secondary | ICD-10-CM

## 2017-09-09 DIAGNOSIS — I272 Pulmonary hypertension, unspecified: Secondary | ICD-10-CM | POA: Diagnosis present

## 2017-09-09 DIAGNOSIS — J189 Pneumonia, unspecified organism: Secondary | ICD-10-CM

## 2017-09-09 DIAGNOSIS — A084 Viral intestinal infection, unspecified: Secondary | ICD-10-CM | POA: Diagnosis present

## 2017-09-09 DIAGNOSIS — Z961 Presence of intraocular lens: Secondary | ICD-10-CM | POA: Diagnosis present

## 2017-09-09 DIAGNOSIS — R05 Cough: Secondary | ICD-10-CM | POA: Diagnosis not present

## 2017-09-09 DIAGNOSIS — J432 Centrilobular emphysema: Secondary | ICD-10-CM

## 2017-09-09 LAB — I-STAT TROPONIN, ED: Troponin i, poc: 0.01 ng/mL (ref 0.00–0.08)

## 2017-09-09 LAB — CBC WITH DIFFERENTIAL/PLATELET
BASOS ABS: 0 10*3/uL (ref 0.0–0.1)
Basophils Relative: 0 %
EOS PCT: 0 %
Eosinophils Absolute: 0 10*3/uL (ref 0.0–0.7)
HCT: 38.3 % — ABNORMAL LOW (ref 39.0–52.0)
HEMOGLOBIN: 12.8 g/dL — AB (ref 13.0–17.0)
LYMPHS ABS: 0.6 10*3/uL — AB (ref 0.7–4.0)
LYMPHS PCT: 4 %
MCH: 31.9 pg (ref 26.0–34.0)
MCHC: 33.4 g/dL (ref 30.0–36.0)
MCV: 95.5 fL (ref 78.0–100.0)
Monocytes Absolute: 1.1 10*3/uL — ABNORMAL HIGH (ref 0.1–1.0)
Monocytes Relative: 8 %
NEUTROS PCT: 88 %
Neutro Abs: 12.5 10*3/uL — ABNORMAL HIGH (ref 1.7–7.7)
PLATELETS: 177 10*3/uL (ref 150–400)
RBC: 4.01 MIL/uL — AB (ref 4.22–5.81)
RDW: 13.4 % (ref 11.5–15.5)
WBC: 14.2 10*3/uL — AB (ref 4.0–10.5)

## 2017-09-09 LAB — BASIC METABOLIC PANEL
ANION GAP: 9 (ref 5–15)
BUN: 27 mg/dL — AB (ref 6–20)
CALCIUM: 9 mg/dL (ref 8.9–10.3)
CO2: 26 mmol/L (ref 22–32)
Chloride: 102 mmol/L (ref 101–111)
Creatinine, Ser: 1.18 mg/dL (ref 0.61–1.24)
GFR calc Af Amer: >60 mL/min (ref >60)
GFR, EST NON AFRICAN AMERICAN: 57 mL/min — AB (ref >60)
GLUCOSE: 147 mg/dL — AB (ref 65–99)
Potassium: 4.2 mmol/L (ref 3.5–5.1)
Sodium: 137 mmol/L (ref 135–145)

## 2017-09-09 LAB — I-STAT CG4 LACTIC ACID, ED
Lactic Acid, Venous: 1.6 mmol/L (ref 0.5–1.9)
Lactic Acid, Venous: 1.66 mmol/L (ref 0.5–1.9)

## 2017-09-09 MED ORDER — SODIUM CHLORIDE 0.9 % IV SOLN
INTRAVENOUS | Status: DC
  Administered 2017-09-10 (×2): via INTRAVENOUS

## 2017-09-09 MED ORDER — ACETAMINOPHEN 500 MG PO TABS
1000.0000 mg | ORAL_TABLET | Freq: Once | ORAL | Status: AC
  Administered 2017-09-09: 1000 mg via ORAL
  Filled 2017-09-09: qty 2

## 2017-09-09 MED ORDER — CEFTRIAXONE SODIUM 1 G IJ SOLR
1.0000 g | Freq: Once | INTRAMUSCULAR | Status: AC
  Administered 2017-09-09: 1 g via INTRAVENOUS
  Filled 2017-09-09: qty 10

## 2017-09-09 MED ORDER — IPRATROPIUM-ALBUTEROL 0.5-2.5 (3) MG/3ML IN SOLN
3.0000 mL | RESPIRATORY_TRACT | Status: AC
  Administered 2017-09-09: 3 mL via RESPIRATORY_TRACT
  Filled 2017-09-09: qty 3

## 2017-09-09 MED ORDER — SILDENAFIL CITRATE 20 MG PO TABS
20.0000 mg | ORAL_TABLET | Freq: Two times a day (BID) | ORAL | Status: DC
  Administered 2017-09-10 – 2017-09-12 (×6): 20 mg via ORAL
  Filled 2017-09-09 (×9): qty 1

## 2017-09-09 MED ORDER — DEXTROSE 5 % IV SOLN
500.0000 mg | Freq: Once | INTRAVENOUS | Status: AC
  Administered 2017-09-09: 500 mg via INTRAVENOUS
  Filled 2017-09-09: qty 500

## 2017-09-09 MED ORDER — VITAMIN C 500 MG PO TABS
500.0000 mg | ORAL_TABLET | Freq: Every day | ORAL | Status: DC
  Administered 2017-09-10: 500 mg via ORAL
  Filled 2017-09-09: qty 1

## 2017-09-09 MED ORDER — IPRATROPIUM-ALBUTEROL 0.5-2.5 (3) MG/3ML IN SOLN
3.0000 mL | RESPIRATORY_TRACT | Status: DC
  Administered 2017-09-10 (×2): 3 mL via RESPIRATORY_TRACT
  Filled 2017-09-09 (×2): qty 3

## 2017-09-09 MED ORDER — DILTIAZEM HCL ER COATED BEADS 240 MG PO CP24
240.0000 mg | ORAL_CAPSULE | Freq: Every day | ORAL | Status: DC
  Administered 2017-09-10 – 2017-09-11 (×3): 240 mg via ORAL
  Filled 2017-09-09 (×3): qty 1

## 2017-09-09 MED ORDER — ALBUTEROL SULFATE (2.5 MG/3ML) 0.083% IN NEBU: 2.5000 mg | INHALATION_SOLUTION | RESPIRATORY_TRACT | Status: DC | PRN

## 2017-09-09 MED ORDER — VITAMIN E 45 MG (100 UNIT) PO CAPS
100.0000 [IU] | ORAL_CAPSULE | Freq: Every day | ORAL | Status: DC
  Administered 2017-09-10: 100 [IU] via ORAL
  Filled 2017-09-09: qty 1

## 2017-09-09 MED ORDER — SODIUM CHLORIDE 0.9 % IV BOLUS (SEPSIS)
2000.0000 mL | Freq: Once | INTRAVENOUS | Status: AC
  Administered 2017-09-10: 1000 mL via INTRAVENOUS

## 2017-09-09 MED ORDER — ROSUVASTATIN CALCIUM 10 MG PO TABS
10.0000 mg | ORAL_TABLET | Freq: Every day | ORAL | Status: DC
  Administered 2017-09-10 – 2017-09-12 (×3): 10 mg via ORAL
  Filled 2017-09-09 (×3): qty 1

## 2017-09-09 MED ORDER — HYDRALAZINE HCL 20 MG/ML IJ SOLN: 5.0000 mg | INTRAMUSCULAR | Status: DC | PRN

## 2017-09-09 MED ORDER — DEXTROSE 5 % IV SOLN
1.0000 g | INTRAVENOUS | Status: DC
  Administered 2017-09-10 – 2017-09-11 (×2): 1 g via INTRAVENOUS
  Filled 2017-09-09 (×3): qty 10

## 2017-09-09 MED ORDER — PREDNISONE 20 MG PO TABS
60.0000 mg | ORAL_TABLET | Freq: Once | ORAL | Status: AC
  Administered 2017-09-09: 60 mg via ORAL
  Filled 2017-09-09: qty 3

## 2017-09-09 MED ORDER — DM-GUAIFENESIN ER 30-600 MG PO TB12: 1.0000 | ORAL_TABLET | Freq: Two times a day (BID) | ORAL | Status: DC | PRN

## 2017-09-09 MED ORDER — TAMSULOSIN HCL 0.4 MG PO CAPS
0.4000 mg | ORAL_CAPSULE | Freq: Every day | ORAL | Status: DC
  Administered 2017-09-10 – 2017-09-12 (×3): 0.4 mg via ORAL
  Filled 2017-09-09 (×3): qty 1

## 2017-09-09 MED ORDER — PANTOPRAZOLE SODIUM 40 MG PO TBEC
40.0000 mg | DELAYED_RELEASE_TABLET | Freq: Every day | ORAL | Status: DC
  Administered 2017-09-10 – 2017-09-12 (×3): 40 mg via ORAL
  Filled 2017-09-09 (×3): qty 1

## 2017-09-09 MED ORDER — ENOXAPARIN SODIUM 40 MG/0.4ML ~~LOC~~ SOLN
40.0000 mg | SUBCUTANEOUS | Status: DC
  Administered 2017-09-10: 40 mg via SUBCUTANEOUS
  Filled 2017-09-09: qty 0.4

## 2017-09-09 MED ORDER — MOMETASONE FURO-FORMOTEROL FUM 200-5 MCG/ACT IN AERO
2.0000 | INHALATION_SPRAY | Freq: Two times a day (BID) | RESPIRATORY_TRACT | Status: DC
  Administered 2017-09-10 – 2017-09-12 (×6): 2 via RESPIRATORY_TRACT
  Filled 2017-09-09: qty 8.8

## 2017-09-09 MED ORDER — TIOTROPIUM BROMIDE MONOHYDRATE 18 MCG IN CAPS
18.0000 ug | ORAL_CAPSULE | Freq: Every day | RESPIRATORY_TRACT | Status: DC
  Filled 2017-09-09 (×2): qty 5

## 2017-09-09 MED ORDER — TEMAZEPAM 15 MG PO CAPS
30.0000 mg | ORAL_CAPSULE | Freq: Every day | ORAL | Status: DC
  Administered 2017-09-10 – 2017-09-11 (×3): 30 mg via ORAL
  Filled 2017-09-09 (×3): qty 2

## 2017-09-09 MED ORDER — DEXTROSE 5 % IV SOLN
500.0000 mg | INTRAVENOUS | Status: DC
  Administered 2017-09-10 – 2017-09-11 (×2): 500 mg via INTRAVENOUS
  Filled 2017-09-09 (×3): qty 500

## 2017-09-09 MED ORDER — ADULT MULTIVITAMIN W/MINERALS CH
1.0000 | ORAL_TABLET | Freq: Every day | ORAL | Status: DC
  Administered 2017-09-10 – 2017-09-12 (×3): 1 via ORAL
  Filled 2017-09-09 (×3): qty 1

## 2017-09-09 MED ORDER — FINASTERIDE 5 MG PO TABS
5.0000 mg | ORAL_TABLET | Freq: Every day | ORAL | Status: DC
  Administered 2017-09-10 – 2017-09-11 (×3): 5 mg via ORAL
  Filled 2017-09-09 (×3): qty 1

## 2017-09-09 MED ORDER — ACETAMINOPHEN 325 MG PO TABS: 650.0000 mg | ORAL_TABLET | Freq: Four times a day (QID) | ORAL | Status: DC | PRN

## 2017-09-09 MED ORDER — ONDANSETRON HCL 4 MG/2ML IJ SOLN: 4.0000 mg | Freq: Three times a day (TID) | INTRAMUSCULAR | Status: DC | PRN

## 2017-09-09 NOTE — ED Provider Notes (Signed)
[facility] EMERGENCY DEPARTMENT Provider Note   CSN: [id number] Arrival date & time: 09/09/17  1843     History   Chief Complaint Chief Complaint  Patient presents with  . Cough  . Fever    HPI Andrew [name] is a 76 y.o. male.  76 yo M with a chief complaint of cough.  This been going on for the past 4 days.  Has had some fevers and chills as well.  Had some transient improvement and then worsening over the past day or so.  Having some increasing shortness of breath as well.  Coughing some yellowish sputum.  History of oxygen use secondary to his COPD.  Is on 2 L at all times.   The history is provided by the patient.  Cough  This is a new problem. The current episode started more than 2 days ago. The problem occurs constantly. The problem has been rapidly worsening. The cough is productive of sputum. The maximum temperature recorded prior to his arrival was 102 to 102.9 F. The fever has been present for 1 to 2 days. Pertinent negatives include no chest pain, no chills, no headaches, no myalgias and no shortness of breath. He has tried nothing for the symptoms. The treatment provided no relief. He is a smoker. His past medical history is significant for COPD.  Fever   Associated symptoms include diarrhea and cough. Pertinent negatives include no chest pain, no vomiting, no congestion and no headaches.    Past Medical History:  Diagnosis Date  . BiPAP (biphasic positive airway pressure) dependence    Pt denies history of OSA  . BPH (benign prostatic hyperplasia)   . Cancer ([place])    skin  . COPD (chronic obstructive pulmonary disease) ([place])   . Dysphagia   . Emphysema lung ([place])   . GERD (gastroesophageal reflux disease)    " Silent reflux"  . Hearing loss    right ear  . HLD (hyperlipidemia)   . Hypertension   . Oxygen dependent    2-3 liters  . Oxygen dependent   . Pneumonia   . Pulmonary hypertension [facility])     Patient Active Problem List   Diagnosis Date Noted  . Sepsis ([place]) 09/09/2017  . Lobar pneumonia ([place]) 09/09/2017  . OSA treated with BiPAP 07/31/2017  . Protein-calorie malnutrition, severe 12/09/2016  . Dysphagia 08/23/2016  . Normocytic anemia 06/28/2016  . Pressure sore on buttocks 06/28/2016  . Pulmonary hypertension ([place]) 02/20/2016  . GERD (gastroesophageal reflux disease) 02/20/2016  . Acute respiratory failure with hypoxia ([place]) 02/12/2016  . Hypertension 02/11/2016  . Hyperlipidemia 02/11/2016  . CAP (community acquired pneumonia)   . BPH (benign prostatic hyperplasia) 12/29/2015  . Acute on chronic respiratory failure with hypoxia ([place]) 12/28/2015  . 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 History of pulmonary hypertension 10/02/2015    Past Surgical History:  Procedure Laterality Date  . CARDIAC CATHETERIZATION    . CATARACT EXTRACTION W/ INTRAOCULAR LENS  IMPLANT, BILATERAL    . ESOPHAGOGASTRODUODENOSCOPY (EGD) WITH PROPOFOL N/A 08/23/2016   Procedure: ESOPHAGOGASTRODUODENOSCOPY (EGD) WITH PROPOFOL;  Surgeon: Wilford Corner, MD;  Location: [facility] ENDOSCOPY;  Service: Endoscopy;  Laterality: N/A;  . LUNG SURGERY    . TONSILLECTOMY         Home Medications    Prior to Admission medications   Medication Sig Start Date End Date Taking? Authorizing Provider  ADVAIR DISKUS 250-50 MCG/DOSE AEPB Inhale 1 puff  into the lungs 2 (two) times daily. 11/01/15   Noralee Space, MD  albuterol (PROVENTIL HFA;VENTOLIN HFA) 108 (90 Base) MCG/ACT inhaler Inhale 2 puffs into the lungs every 6 (six) hours as needed for wheezing or shortness of breath. 04/17/17   Noralee Space, MD  azithromycin (ZITHROMAX) 250 MG tablet Take as per package directions Patient not taking: Reported on 07/31/2017 07/11/17   Noralee Space, MD  diltiazem (CARTIA XT) 240 MG 24 hr capsule Take 240 mg by mouth at bedtime.    [provider]  finasteride (PROSCAR) 5 MG tablet  Take 5 mg by mouth at bedtime.     [provider]  ibuprofen (ADVIL,MOTRIN) 200 MG tablet Take 200 mg by mouth every 6 (six) hours as needed for moderate pain.    [provider]  ipratropium-albuterol (DUONEB) 0.5-2.5 (3) MG/3ML SOLN INHALE 1 VIAL VIA NEBULIZER EVERY 6 HOURS AS NEEDED 08/19/17   Noralee Space, MD  methylPREDNISolone (MEDROL) 4 MG TBPK tablet Take as directed per package instructions Patient not taking: Reported on 07/31/2017 07/11/17   Noralee Space, MD  Multiple Vitamins-Minerals (MULTIVITAMIN WITH MINERALS) tablet Take 1 tablet by mouth daily.    [provider]  omeprazole (PRILOSEC) 40 MG capsule Take 40 mg by mouth daily.    [provider]  OVER THE COUNTER MEDICATION Apply 1 application topically See admin instructions. Protective ointment from home health care - apply approximately every 4 hours to left butt cheek for wound care    [provider]  OXYGEN Inhale 2-3 L into the lungs continuous. 2L daytime, 3L at night    [provider]  polyethylene glycol (MIRALAX / GLYCOLAX) packet Take 17 g by mouth daily. Patient taking differently: Take 17 g by mouth at bedtime. Mix in 8 oz liquid and drink 05/08/16   Mesner, Corene Cornea, MD  rosuvastatin (CRESTOR) 10 MG tablet Take 10 mg by mouth daily.    [provider]  sildenafil (REVATIO) 20 MG tablet Take 1 tablet (20 mg total) by mouth 2 (two) times daily. 10/23/16   Noralee Space, MD  SPIRIVA HANDIHALER 18 MCG inhalation capsule INHALE THE CONTENTS OF 1 CAPSULE EVERY DAY 06/03/17   Noralee Space, MD  tamsulosin (FLOMAX) 0.4 MG CAPS capsule Take 1 capsule (0.4 mg total) by mouth daily after breakfast. Patient taking differently: Take 0.4 mg by mouth at bedtime.  01/03/16   Nita Sells, MD  temazepam (RESTORIL) 30 MG capsule Take 30 mg by mouth at bedtime.    [provider]  vitamin C (ASCORBIC ACID) 500 MG tablet Take 500 mg by mouth daily.    [provider]  VITAMIN E PO Take 1 capsule by mouth daily.    [provider]    Family History Family History  Problem Relation Age of Onset  . Bone cancer Maternal Uncle     Social History Social History  Substance Use Topics  . Smoking status: Former Smoker    Packs/day: 0.00    Years: 0.00    Types: Cigarettes    Quit date: 10/01/1984  . Smokeless tobacco: Never Used  . Alcohol use No     Allergies   Patient has no known allergies.   Review of Systems Review of Systems  Constitutional: Positive for fever. Negative for chills.  HENT: Negative for congestion and facial swelling.   Eyes: Negative for discharge and visual disturbance.  Respiratory: Positive for cough. Negative for shortness  of breath.   Cardiovascular: Negative for chest pain and palpitations.  Gastrointestinal: Positive for diarrhea. Negative for abdominal pain and vomiting.  Musculoskeletal: Negative for arthralgias and myalgias.  Skin: Negative for color change and rash.  Neurological: Positive for weakness. Negative for tremors, syncope and headaches.  Psychiatric/Behavioral: Negative for confusion and dysphoric mood.     Physical Exam Updated Vital Signs BP (!) 144/65   Pulse 97   Temp 99.1 F (37.3 C) (Oral)   Resp 20   SpO2 93%   Physical Exam  Constitutional: He is oriented to person, place, and time. He appears well-developed and well-nourished.  HENT:  Head: Normocephalic and atraumatic.  Eyes: Pupils are equal, round, and reactive to light. EOM are normal.  Neck: Normal range of motion. Neck supple. No JVD present.  Cardiovascular: Normal rate and regular rhythm.  Exam reveals no gallop and no friction rub.   No murmur heard. Pulmonary/Chest: No respiratory distress. He has no wheezes.  Diminished in all fields  Abdominal: He exhibits no distension and no mass. There is no tenderness. There is no rebound and no guarding.  Musculoskeletal: Normal range of motion.    Neurological: He is alert and oriented to person, place, and time.  Skin: No rash noted. No pallor.  Psychiatric: He has a normal mood and affect. His behavior is normal.  Nursing note and vitals reviewed.    ED Treatments / Results  Labs (all labs ordered are listed, but only abnormal results are displayed) Labs Reviewed  CBC WITH DIFFERENTIAL/PLATELET - Abnormal; Notable for the following:       Result Value   WBC 14.2 (*)    RBC 4.01 (*)    Hemoglobin 12.8 (*)    HCT 38.3 (*)    Neutro Abs 12.5 (*)    Lymphs Abs 0.6 (*)    Monocytes Absolute 1.1 (*)    All other components within normal limits  BASIC METABOLIC PANEL - Abnormal; Notable for the following:    Glucose, Bld 147 (*)    BUN 27 (*)    GFR calc non Af Amer 57 (*)    All other components within normal limits  GASTROINTESTINAL PANEL BY PCR, STOOL (REPLACES STOOL CULTURE)  CULTURE, BLOOD (ROUTINE X 2)  CULTURE, BLOOD (ROUTINE X 2)  I-STAT CG4 LACTIC ACID, ED  I-STAT TROPONIN, ED  I-STAT CG4 LACTIC ACID, ED    EKG  EKG Interpretation  Date/Time:  Tuesday September 09 2017 18:56:15 EDT Ventricular Rate:  99 PR Interval:    QRS Duration: 90 QT Interval:  330 QTC Calculation: 424 R Axis:   29 Text Interpretation:  Sinus rhythm LAE, consider biatrial enlargement Anteroseptal infarct, age indeterminate Baseline wander in lead(s) II III aVR aVL aVF No significant change since last tracing Confirmed by Deno Etienne [phone]) on 09/09/2017 6:59:54 PM       Radiology Dg Chest 2 View  Result Date: 09/09/2017 CLINICAL DATA:  Productive cough and fever. EXAM: CHEST  2 VIEW COMPARISON:  12/07/2016. FINDINGS: Normal sized heart. Interval mild patchy opacity and small amount of pleural fluid at the right lung base. Interval linear density at the medial left lung base. Stable bilateral bullous changes. Unremarkable bones. IMPRESSION: 1. Right basilar pneumonia and small amount of pleural fluid. 2. Left basilar linear  atelectasis. 3. Stable changes of COPD. Electronically Signed   By: Claudie Revering M.D.   On: 09/09/2017 19:38    Procedures Procedures (including critical care time)  Medications Ordered in  ED Medications  ipratropium-albuterol (DUONEB) 0.5-2.5 (3) MG/3ML nebulizer solution 3 mL (3 mLs Nebulization Given 09/09/17 2040)  azithromycin (ZITHROMAX) 500 mg in dextrose 5 % 250 mL IVPB (500 mg Intravenous New Bag/Given 09/09/17 2038)  acetaminophen (TYLENOL) tablet 1,000 mg (1,000 mg Oral Given 09/09/17 2038)  predniSONE (DELTASONE) tablet 60 mg (60 mg Oral Given 09/09/17 2038)  cefTRIAXone (ROCEPHIN) 1 g in dextrose 5 % 50 mL IVPB (1 g Intravenous New Bag/Given 09/09/17 2040)     Initial Impression / Assessment and Plan / ED Course  I have reviewed the triage vital signs and the nursing notes.  Pertinent labs & imaging results that were available during my care of the patient were reviewed by me and considered in my medical decision making (see chart for details).     76 yo M with a chief complaint of cough.  Going on for the past 4 days.  Worsening now with fever.  Diminished lung sounds in all fields.  Will give 3 duo nebs back-to-back and steroids.  Patient does have a right lower lobe infiltrate on my view of the chest x-ray.  We will start on Rocephin and azithromycin.  Patient continuing to feel unwell after duo nebs.  Has an improvement of aeration.  Will admit.  The patients results and plan were reviewed and discussed.   Any x-rays performed were independently reviewed by myself.   Differential diagnosis were considered with the presenting HPI.  Medications  ipratropium-albuterol (DUONEB) 0.5-2.5 (3) MG/3ML nebulizer solution 3 mL (3 mLs Nebulization Given 09/09/17 2040)  azithromycin (ZITHROMAX) 500 mg in dextrose 5 % 250 mL IVPB (500 mg Intravenous New Bag/Given 09/09/17 2038)  acetaminophen (TYLENOL) tablet 1,000 mg (1,000 mg Oral Given 09/09/17 2038)  predniSONE (DELTASONE)  tablet 60 mg (60 mg Oral Given 09/09/17 2038)  cefTRIAXone (ROCEPHIN) 1 g in dextrose 5 % 50 mL IVPB (1 g Intravenous New Bag/Given 09/09/17 2040)    Vitals:   09/09/17 1900 09/09/17 1930 09/09/17 2000 09/09/17 2015  BP: (!) 118/93 (!) 142/64 (!) 144/65   Pulse: (!) 101 98  97  Resp: 17 20    Temp:      TempSrc:      SpO2: 97% (!) 87%  93%    Final diagnoses:  Community acquired pneumonia of right lower lobe of lung ([place])  COPD exacerbation ([place])    Admission/ observation were discussed with the admitting physician, patient and/or family and they are comfortable with the plan.    Final Clinical Impressions(s) / ED Diagnoses   Final diagnoses:  Community acquired pneumonia of right lower lobe of lung ([place])  COPD exacerbation ([place])    New Prescriptions New Prescriptions   No medications on file     Deno Etienne, DO 09/09/17 2118

## 2017-09-09 NOTE — ED Triage Notes (Signed)
[facility]- pt coming from home with complaint of cough X4 days. Hx of COPD. Pt has had productive cough with yellow sputum. Highest recorded temp at home 102. Has been taking tyelonol as well. Pt wears 2L at home during the day time but was foun to have SPO2% of 89 on EMS arrival, placed on NRB mask at 12L with improvement to 97%.

## 2017-09-09 NOTE — H&P (Addendum)
History and Physical    Sherrill Mckamie WVP:[id number] DOB: 12-19-1940 DOA: 09/09/2017  Referring MD/NP/PA:   PCP: Lujean Amel, MD   Patient coming from:  The patient is coming from home.  At baseline, pt is independent for most of ADL.   Chief Complaint: Productive cough, shortness of breath and fever, diarrhea  HPI: Kodie Kishi is a 76 y.o. male with medical history significant of COPD, chronic respiratory failure on 2 L oxygen at home, OSA on BiPAP, hypertension, hyperlipidemia, GERD, pulmonary hypertension, BPH, who presents with productive cough, shortness of breath and fever, diarrhea.  Patient states that he has been having cough, shortness of breath in the past 4 days, which has been progressively getting worse. He has greenish colored sputum production. He had chill and fever of 102 at home. Patient also reports diarrhea which has been going on for 4 days. He has 3-4 watery stool bowel movement each day. He was on MiraLAX, but his diarrhea continues after he stopped taking MiraLAX. He has nausea, but no vomiting. He does not have abdominal pain at rest, but coughing induced some abdominal pain. Denies symptoms of UTI or unilateral weakness.  ED Course: pt was found to have  WBC 14.2, lactic acid 1.6, 1.66, creatinine 1.18, temperature 99.1, tachycardia, tachypnea, oxygen 87% on 2 L oxygen, negative troponin, chest x-ray showed right base infiltration with small amount of pleural effusion. Patient is admitted to telemetry bed as inpatient.   Review of Systems:   General: has fevers, chills, no body weight gain, has poor appetite, has fatigue HEENT: no blurry vision, hearing changes or sore throat Respiratory: no dyspnea, coughing, wheezing CV: no chest pain, no palpitations GI: has diarrhea and nausea, no vomiting, abdominal pain, constipation GU: no dysuria, burning on urination, increased urinary frequency, hematuria  Ext: no leg edema Neuro: no unilateral weakness, numbness,  or tingling, no vision change or hearing loss Skin: no rash, no skin tear. MSK: No muscle spasm, no deformity, no limitation of range of movement in spin Heme: No easy bruising.  Travel history: No recent long distant travel.  Allergy: No Known Allergies  Past Medical History:  Diagnosis Date  . BiPAP (biphasic positive airway pressure) dependence    Pt denies history of OSA  . BPH (benign prostatic hyperplasia)   . Cancer ([place])    skin  . COPD (chronic obstructive pulmonary disease) ([place])   . Dysphagia   . Emphysema lung ([place])   . GERD (gastroesophageal reflux disease)    " Silent reflux"  . Hearing loss    right ear  . HLD (hyperlipidemia)   . Hypertension   . Oxygen dependent    2-3 liters  . Oxygen dependent   . Pneumonia   . Pulmonary hypertension ([place])     Past Surgical History:  Procedure Laterality Date  . CARDIAC CATHETERIZATION    . CATARACT EXTRACTION W/ INTRAOCULAR LENS  IMPLANT, BILATERAL    . ESOPHAGOGASTRODUODENOSCOPY (EGD) WITH PROPOFOL N/A 08/23/2016   Procedure: ESOPHAGOGASTRODUODENOSCOPY (EGD) WITH PROPOFOL;  Surgeon: Wilford Corner, MD;  Location: [facility] ENDOSCOPY;  Service: Endoscopy;  Laterality: N/A;  . LUNG SURGERY    . TONSILLECTOMY      Social History:  reports that he quit smoking about 32 years ago. His smoking use included Cigarettes. He smoked 0.00 packs per day for 0.00 years. He has never used smokeless tobacco. He reports that he does not drink alcohol or use drugs.  Family History:  Family History  Problem Relation Age  of Onset  . Bone cancer Maternal Uncle      Prior to Admission medications   Medication Sig Start Date End Date Taking? Authorizing Provider  ADVAIR DISKUS 250-50 MCG/DOSE AEPB Inhale 1 puff into the lungs 2 (two) times daily. 11/01/15   Noralee Space, MD  albuterol (PROVENTIL HFA;VENTOLIN HFA) 108 (90 Base) MCG/ACT inhaler Inhale 2 puffs into the lungs every 6 (six) hours as needed for wheezing or shortness of breath.  04/17/17   Noralee Space, MD  azithromycin (ZITHROMAX) 250 MG tablet Take as per package directions Patient not taking: Reported on 07/31/2017 07/11/17   Noralee Space, MD  diltiazem (CARTIA XT) 240 MG 24 hr capsule Take 240 mg by mouth at bedtime.    [provider]  finasteride (PROSCAR) 5 MG tablet Take 5 mg by mouth at bedtime.     [provider]  ibuprofen (ADVIL,MOTRIN) 200 MG tablet Take 200 mg by mouth every 6 (six) hours as needed for moderate pain.    [provider]  ipratropium-albuterol (DUONEB) 0.5-2.5 (3) MG/3ML SOLN INHALE 1 VIAL VIA NEBULIZER EVERY 6 HOURS AS NEEDED 08/19/17   Noralee Space, MD  methylPREDNISolone (MEDROL) 4 MG TBPK tablet Take as directed per package instructions Patient not taking: Reported on 07/31/2017 07/11/17   Noralee Space, MD  Multiple Vitamins-Minerals (MULTIVITAMIN WITH MINERALS) tablet Take 1 tablet by mouth daily.    [provider]  omeprazole (PRILOSEC) 40 MG capsule Take 40 mg by mouth daily.    [provider]  OVER THE COUNTER MEDICATION Apply 1 application topically See admin instructions. Protective ointment from home health care - apply approximately every 4 hours to left butt cheek for wound care    [provider]  OXYGEN Inhale 2-3 L into the lungs continuous. 2L daytime, 3L at night    [provider]  polyethylene glycol (MIRALAX / GLYCOLAX) packet Take 17 g by mouth daily. Patient taking differently: Take 17 g by mouth at bedtime. Mix in 8 oz liquid and drink 05/08/16   Mesner, Corene Cornea, MD  rosuvastatin (CRESTOR) 10 MG tablet Take 10 mg by mouth daily.    [provider]  sildenafil (REVATIO) 20 MG tablet Take 1 tablet (20 mg total) by mouth 2 (two) times daily. 10/23/16   Noralee Space, MD  SPIRIVA HANDIHALER 18 MCG inhalation capsule INHALE THE CONTENTS OF 1 CAPSULE EVERY DAY 06/03/17   Noralee Space, MD  tamsulosin (FLOMAX) 0.4 MG CAPS capsule Take 1 capsule (0.4 mg  total) by mouth daily after breakfast. Patient taking differently: Take 0.4 mg by mouth at bedtime.  01/03/16   Nita Sells, MD  temazepam (RESTORIL) 30 MG capsule Take 30 mg by mouth at bedtime.    [provider]  vitamin C (ASCORBIC ACID) 500 MG tablet Take 500 mg by mouth daily.    [provider]  VITAMIN E PO Take 1 capsule by mouth daily.    [provider]    Physical Exam: Vitals:   09/09/17 1900 09/09/17 1930 09/09/17 2000 09/09/17 2015  BP: (!) 118/93 (!) 142/64 (!) 144/65   Pulse: (!) 101 98  97  Resp: 17 20    Temp:      TempSrc:      SpO2: 97% (!) 87%  93%   General: Not in acute distress HEENT:       Eyes: PERRL, EOMI, no scleral icterus.       ENT: No  discharge from the ears and nose, no pharynx injection, no tonsillar enlargement.        Neck: No JVD, no bruit, no mass felt. Heme: No neck lymph node enlargement. Cardiac: S1/S2, RRR, No murmurs, No gallops or rubs. Respiratory: Has decreased air movement and coarse breathing soundbilaterally. No rales, wheezing, rhonchi or rubs. GI: Soft, nondistended, nontender, no rebound pain, no organomegaly, BS present. GU: No hematuria Ext: No pitting leg edema bilaterally. 2+DP/PT pulse bilaterally. Musculoskeletal: No joint deformities, No joint redness or warmth, no limitation of ROM in spin. Skin: No rashes.  Neuro: Alert, oriented X3, cranial nerves II-XII grossly intact, moves all extremities normally.  Psych: Patient is not psychotic, no suicidal or hemocidal ideation.  Labs on Admission: I have personally reviewed following labs and imaging studies  CBC:  Recent Labs Lab 09/09/17 1905  WBC 14.2*  NEUTROABS 12.5*  HGB 12.8*  HCT 38.3*  MCV 95.5  PLT 846   Basic Metabolic Panel:  Recent Labs Lab 09/09/17 1905  NA 137  K 4.2  CL 102  CO2 26  GLUCOSE 147*  BUN 27*  CREATININE 1.18  CALCIUM 9.0   GFR: Estimated Creatinine Clearance: 46.3 mL/min (by C-G formula  based on SCr of 1.18 mg/dL). Liver Function Tests: No results for input(s): AST, ALT, ALKPHOS, BILITOT, PROT, ALBUMIN in the last 168 hours. No results for input(s): LIPASE, AMYLASE in the last 168 hours. No results for input(s): AMMONIA in the last 168 hours. Coagulation Profile: No results for input(s): INR, PROTIME in the last 168 hours. Cardiac Enzymes: No results for input(s): CKTOTAL, CKMB, CKMBINDEX, TROPONINI in the last 168 hours. BNP (last 3 results) No results for input(s): PROBNP in the last 8760 hours. HbA1C: No results for input(s): HGBA1C in the last 72 hours. CBG: No results for input(s): GLUCAP in the last 168 hours. Lipid Profile: No results for input(s): CHOL, HDL, LDLCALC, TRIG, CHOLHDL, LDLDIRECT in the last 72 hours. Thyroid Function Tests: No results for input(s): TSH, T4TOTAL, FREET4, T3FREE, THYROIDAB in the last 72 hours. Anemia Panel: No results for input(s): VITAMINB12, FOLATE, FERRITIN, TIBC, IRON, RETICCTPCT in the last 72 hours. Urine analysis:    Component Value Date/Time   COLORURINE YELLOW 12/07/2016 2026   APPEARANCEUR CLEAR 12/07/2016 2026   LABSPEC 1.013 12/07/2016 2026   PHURINE 7.0 12/07/2016 2026   GLUCOSEU >=500 (A) 12/07/2016 2026   HGBUR NEGATIVE 12/07/2016 2026   BILIRUBINUR NEGATIVE 12/07/2016 2026   KETONESUR NEGATIVE 12/07/2016 2026   PROTEINUR NEGATIVE 12/07/2016 2026   NITRITE NEGATIVE 12/07/2016 2026   LEUKOCYTESUR NEGATIVE 12/07/2016 2026   Sepsis Labs: @LABRCNTIP (procalcitonin:4,lacticidven:4) )No results found for this or any previous visit (from the past 240 hour(s)).   Radiological Exams on Admission: Dg Chest 2 View  Result Date: 09/09/2017 CLINICAL DATA:  Productive cough and fever. EXAM: CHEST  2 VIEW COMPARISON:  12/07/2016. FINDINGS: Normal sized heart. Interval mild patchy opacity and small amount of pleural fluid at the right lung base. Interval linear density at the medial left lung base. Stable bilateral  bullous changes. Unremarkable bones. IMPRESSION: 1. Right basilar pneumonia and small amount of pleural fluid. 2. Left basilar linear atelectasis. 3. Stable changes of COPD. Electronically Signed   By: Claudie Revering M.D.   On: 09/09/2017 19:38     EKG: Independently reviewed.  Sinus rhythm, QTC 424, anteroseptal infarction pattern, borderline LAD.  Assessment/Plan Principal Problem:   Lobar pneumonia (HCC) Active Problems:   History of pulmonary hypertension   Acute on  chronic respiratory failure with hypoxia (HCC)   BPH (benign prostatic hyperplasia)   Hypertension   Hyperlipidemia   COPD exacerbation (HCC)   GERD (gastroesophageal reflux disease)   OSA treated with BiPAP   Sepsis ([place])   Diarrhea   Acute on chronic respiratory failure with hypoxia due to lobar pneumonia and sepsis: Patient's productive cough, fever and CXR findings of R base infiltration, consistent with lobar pneumonia. Patient meets criteria for sepsis with leukocytosis, tachycardia, tachypnea and fever. Lactic acid is normal. Currently hemodynamically stable.  - will admit to tele bed as inpt - IV Rocephin and azithromycin - Mucinex for cough  - prn Albuterol Nebs, DuoNe for SOB - Urine legionella and S. pneumococcal antigen - Follow up blood culture x2, sputum culture and respiratory virus panel, plus Flu pcr - will get Procalcitonin and trend lactic acid level per sepsis protocol - IVF: 2L of NS bolus in ED, followed by 125 mL per hour of NS  - start flagyl and get SLP since pt had hx of dysphagia, cannot completely rule out aspiration pneumonia.  COPD exacerbation: Patient does not have for wheezing or rhonchi on auscultation, but he has productive cough and decreased air movement bilaterally, indicating mild COPD exacerbation. -Bronchodilator treatment -IV antibiotics as above  Diarrhea: Most likely due to viral enteritis, but need to rule out other possibilities, such as C. difficile colitis. -GI panel  and C. difficile PCR -When necessary Zofran for nausea -IV fluid as above  HTN: -continue cardizem -IV hydralazine when necessary  History of pulmonary hypertension: -continue Sildenafil  BPH: stable - Continue Flomax and proscar  HLD: -Crestor  GERD: -Protonix  OSA - BiPAP  DVT ppx: SQ Lovenox Code Status: Full code Family Communication: None at bed side.   Disposition Plan:  Anticipate discharge back to previous home environment Consults called:  None Admission status:   Inpatient/tele     Date of Service 09/09/2017    Ivor Costa [facility] Pager [phone]  If 7PM-7AM, please contact night-coverage [email or web address] Password [facility] 09/09/2017, 9:44 PM

## 2017-09-10 ENCOUNTER — Inpatient Hospital Stay (HOSPITAL_COMMUNITY): Payer: Medicare Other

## 2017-09-10 DIAGNOSIS — E440 Moderate protein-calorie malnutrition: Secondary | ICD-10-CM | POA: Insufficient documentation

## 2017-09-10 DIAGNOSIS — I4891 Unspecified atrial fibrillation: Secondary | ICD-10-CM

## 2017-09-10 LAB — ECHOCARDIOGRAM COMPLETE
HEIGHTINCHES: 67 in
WEIGHTICAEL: 2105.6 [oz_av]

## 2017-09-10 LAB — RESPIRATORY PANEL BY PCR
ADENOVIRUS-RVPPCR: NOT DETECTED
BORDETELLA PERTUSSIS-RVPCR: NOT DETECTED
CORONAVIRUS HKU1-RVPPCR: NOT DETECTED
CORONAVIRUS NL63-RVPPCR: NOT DETECTED
Chlamydophila pneumoniae: NOT DETECTED
Coronavirus 229E: NOT DETECTED
Coronavirus OC43: NOT DETECTED
INFLUENZA A-RVPPCR: NOT DETECTED
Influenza B: NOT DETECTED
METAPNEUMOVIRUS-RVPPCR: NOT DETECTED
Mycoplasma pneumoniae: NOT DETECTED
PARAINFLUENZA VIRUS 2-RVPPCR: NOT DETECTED
PARAINFLUENZA VIRUS 3-RVPPCR: NOT DETECTED
PARAINFLUENZA VIRUS 4-RVPPCR: NOT DETECTED
Parainfluenza Virus 1: NOT DETECTED
RHINOVIRUS / ENTEROVIRUS - RVPPCR: NOT DETECTED
Respiratory Syncytial Virus: NOT DETECTED

## 2017-09-10 LAB — C DIFFICILE QUICK SCREEN W PCR REFLEX
C DIFFICILE (CDIFF) INTERP: NOT DETECTED
C DIFFICILE (CDIFF) TOXIN: NEGATIVE
C DIFFICLE (CDIFF) ANTIGEN: NEGATIVE

## 2017-09-10 LAB — PROCALCITONIN: Procalcitonin: 3.01 ng/mL

## 2017-09-10 LAB — CBC
HCT: 36.9 % — ABNORMAL LOW (ref 39.0–52.0)
Hemoglobin: 12.3 g/dL — ABNORMAL LOW (ref 13.0–17.0)
MCH: 32 pg (ref 26.0–34.0)
MCHC: 33.3 g/dL (ref 30.0–36.0)
MCV: 96.1 fL (ref 78.0–100.0)
PLATELETS: 163 10*3/uL (ref 150–400)
RBC: 3.84 MIL/uL — AB (ref 4.22–5.81)
RDW: 13.5 % (ref 11.5–15.5)
WBC: 9.6 10*3/uL (ref 4.0–10.5)

## 2017-09-10 LAB — STREP PNEUMONIAE URINARY ANTIGEN: Strep Pneumo Urinary Antigen: NEGATIVE

## 2017-09-10 LAB — LACTIC ACID, PLASMA: Lactic Acid, Venous: 1 mmol/L (ref 0.5–1.9)

## 2017-09-10 LAB — INFLUENZA PANEL BY PCR (TYPE A & B)
INFLBPCR: NEGATIVE
Influenza A By PCR: NEGATIVE

## 2017-09-10 LAB — HEPARIN LEVEL (UNFRACTIONATED): HEPARIN UNFRACTIONATED: 0.11 [IU]/mL — AB (ref 0.30–0.70)

## 2017-09-10 MED ORDER — LEVALBUTEROL HCL 0.63 MG/3ML IN NEBU
0.6300 mg | INHALATION_SOLUTION | Freq: Four times a day (QID) | RESPIRATORY_TRACT | Status: DC
  Administered 2017-09-10 (×2): 0.63 mg via RESPIRATORY_TRACT
  Filled 2017-09-10 (×2): qty 3

## 2017-09-10 MED ORDER — SODIUM CHLORIDE 0.9 % IV SOLN
INTRAVENOUS | Status: DC
  Administered 2017-09-10: 21:00:00 via INTRAVENOUS

## 2017-09-10 MED ORDER — IPRATROPIUM BROMIDE 0.02 % IN SOLN
0.5000 mg | Freq: Four times a day (QID) | RESPIRATORY_TRACT | Status: DC
  Administered 2017-09-10 (×2): 0.5 mg via RESPIRATORY_TRACT
  Filled 2017-09-10 (×2): qty 2.5

## 2017-09-10 MED ORDER — METRONIDAZOLE 500 MG PO TABS
500.0000 mg | ORAL_TABLET | Freq: Three times a day (TID) | ORAL | Status: DC
  Administered 2017-09-10: 500 mg via ORAL
  Filled 2017-09-10: qty 1

## 2017-09-10 MED ORDER — HEPARIN (PORCINE) IN NACL 100-0.45 UNIT/ML-% IJ SOLN
1050.0000 [IU]/h | INTRAMUSCULAR | Status: DC
  Administered 2017-09-10: 900 [IU]/h via INTRAVENOUS
  Filled 2017-09-10: qty 250

## 2017-09-10 MED ORDER — METHYLPREDNISOLONE SODIUM SUCC 125 MG IJ SOLR
60.0000 mg | Freq: Every day | INTRAMUSCULAR | Status: DC
  Administered 2017-09-11 – 2017-09-12 (×2): 60 mg via INTRAVENOUS
  Filled 2017-09-10 (×2): qty 2

## 2017-09-10 MED ORDER — ACETAMINOPHEN 325 MG PO TABS: 650.0000 mg | ORAL_TABLET | Freq: Four times a day (QID) | ORAL | Status: DC | PRN

## 2017-09-10 MED ORDER — BOOST / RESOURCE BREEZE PO LIQD
1.0000 | Freq: Three times a day (TID) | ORAL | Status: DC
Start: 2017-09-10 — End: 2017-09-12
  Administered 2017-09-10 – 2017-09-12 (×7): 1 via ORAL

## 2017-09-10 MED ORDER — METHYLPREDNISOLONE SODIUM SUCC 125 MG IJ SOLR
60.0000 mg | Freq: Four times a day (QID) | INTRAMUSCULAR | Status: DC
  Administered 2017-09-10 (×2): 60 mg via INTRAVENOUS
  Filled 2017-09-10 (×2): qty 2

## 2017-09-10 MED ORDER — ORAL CARE MOUTH RINSE
15.0000 mL | Freq: Two times a day (BID) | OROMUCOSAL | Status: DC
  Administered 2017-09-10 – 2017-09-12 (×5): 15 mL via OROMUCOSAL

## 2017-09-10 MED ORDER — DILTIAZEM HCL 25 MG/5ML IV SOLN
10.0000 mg | Freq: Once | INTRAVENOUS | Status: AC
  Administered 2017-09-10: 10 mg via INTRAVENOUS
  Filled 2017-09-10: qty 5

## 2017-09-10 NOTE — Progress Notes (Signed)
Initial Nutrition Assessment  DOCUMENTATION CODES:   Non-severe (moderate) malnutrition in context of chronic illness  INTERVENTION:   -Boost Breeze po TID, each supplement provides 250 kcal and 9 grams of protein  NUTRITION DIAGNOSIS:   Malnutrition (Moderate) related to chronic illness (COPD) as evidenced by mild depletion of body fat, moderate depletion of body fat, mild depletion of muscle mass, moderate depletions of muscle mass.  GOAL:   Patient will meet greater than or equal to 90% of their needs  MONITOR:   PO intake, Supplement acceptance, Labs, Weight trends, Skin, I & O's  REASON FOR ASSESSMENT:   Consult Assessment of nutrition requirement/status  ASSESSMENT:   Andrew [name] is a 76 y.o. male with medical history significant of COPD, chronic respiratory failure on 2 L oxygen at home, OSA on BiPAP, hypertension, hyperlipidemia, GERD, pulmonary hypertension, BPH, who presents with productive cough, shortness of breath and fever, diarrhea.  Pt admitted with lobar pneumonia.   Spoke with pt, who reports he has not eaten anything over the past 3-5 days due to difficulty breathing. Pt usually consumes 3 meals per day and has a good appetite- breakfast: cereal, lunch: half hamburger and onion rings, dinner: half hamburger and dinner with family. He consumed most of his breakfast this morning.   Pt reports wt is usually stable, but estimates he lost about 5-10# PTA due to not eating.   Nutrition-Focused physical exam completed. Findings are mild to moderate fat depletion, mild to moderate muscle depletion, and no edema.   Discussed with pt importance of good meal intake to promote healing. Pt reports he has tried Ensure in the past, but dislikes it. He is amenable to try Boost Breeze.   Case discussed with SLP, who reports will perform BSE after RD visit due to intermittent dysphagia.  Labs reviewed.  Diet Order:  Diet Heart Room service appropriate? Yes; Fluid  consistency: Thin  Skin:  Reviewed, no issues  Last BM:  09/09/17  Height:   Ht Readings from Last 1 Encounters:  09/09/17 5\' 7"  (1.702 m)    Weight:   Wt Readings from Last 1 Encounters:  09/09/17 131 lb 9.6 oz (59.7 kg)    Ideal Body Weight:  67.3 kg  BMI:  Body mass index is 20.61 kg/m.  Estimated Nutritional Needs:   Kcal:  1900-2100  Protein:  105-120 grams  Fluid:  1.9-2.1 L  EDUCATION NEEDS:   Education needs addressed  Clifton Safley A. Jimmye Norman, RD, LDN, CDE Pager: [phone] After hours Pager: [phone]

## 2017-09-10 NOTE — Progress Notes (Signed)
Called about patient being in AF HR in the 120-130s, RN had already paged primary provider, received order for 10mg  Cardizem IV, HR now in the 110-115s, SBP > 100, patient is and has been asymptomatic.    Patient's HR increased after receiving DOUNEB per RN.  Plan: RN text paged MD back with update, awaiting call back, RN will ask if patient can be switched to Xopenox.   No RR RN interventions

## 2017-09-10 NOTE — Progress Notes (Signed)
[facility] Progress Note  Patient: Andrew [name] GBT:[id number]   PCP: Lujean Amel, MD DOB: 1941/09/24   DOA: 09/09/2017   DOS: 09/10/2017   Date of Service: the patient was seen and examined on 09/10/2017  Subjective: Feeling better, still has cough and shortness of breath.  Continues to have diarrhea present before admission for last 5 days, 3-4 soft BM without any blood.  Took stool softeners 4 days ago.  Brief hospital course: Pt. with PMH of COPD, chronic respiratory failure, OSA on BiPAP, HTN, GERD, BPH; admitted on 09/09/2017, presented with complaint of cough, was found to have pneumonia. Currently further plan is continue current antibiotics.  Assessment and Plan: 1.  Acute on chronic hypoxic respiratory failure. Right lower lobe pneumonia. COPD exacerbation. Continue IV ceftriaxone and azithromycin.  Continue duo nebs.  Influenza PCR, respiratory virus panel, urine Legionella and streptococcal antigens all negative. Blood culture negative for 24 hours. Patient was given aggressive IV hydration, will continue on gentle IV hydration for now. Follow-up on speech evaluation. Mucinex for cough. Steroids added.  2.  Diarrhea. Present on admission. C. difficile PCR negative. As needed Imodium and probiotics.  3.  Suspected A. fib-ruled out. PAC patient was started on heparin infusion overnight, will stop it. Echocardiogram shows preserved EF. No complaints of chest pain chest tightness or chest pressure. Monitor.  History of pulmonary hypertension: -continue Sildenafil  BPH: stable - Continue Flomax and proscar  HLD: -Crestor  GERD: -Protonix  OSA - BiPAP  Diet: cardiac diet DVT Prophylaxis: subcutaneous Heparin  Advance goals of care discussion: full code  Family Communication: no family was present at bedside, at the time of interview.  Disposition:  Discharge to home.  Consultants: none Procedures: echo  Antibiotics: Anti-infectives      Start     Dose/Rate Route Frequency Ordered Stop   09/10/17 2100  azithromycin (ZITHROMAX) 500 mg in dextrose 5 % 250 mL IVPB     500 mg 250 mL/hr over 60 Minutes Intravenous Every 24 hours 09/09/17 2124 09/16/17 2059   09/10/17 2000  cefTRIAXone (ROCEPHIN) 1 g in dextrose 5 % 50 mL IVPB     1 g 100 mL/hr over 30 Minutes Intravenous Every 24 hours 09/09/17 2124 09/16/17 1959   09/10/17 0030  metroNIDAZOLE (FLAGYL) tablet 500 mg  Status:  Discontinued     500 mg Oral Every 8 hours 09/10/17 0024 09/10/17 0805   09/09/17 1945  cefTRIAXone (ROCEPHIN) 1 g in dextrose 5 % 50 mL IVPB     1 g 100 mL/hr over 30 Minutes Intravenous  Once 09/09/17 1932 09/09/17 2110   09/09/17 1945  azithromycin (ZITHROMAX) 500 mg in dextrose 5 % 250 mL IVPB     500 mg 250 mL/hr over 60 Minutes Intravenous  Once 09/09/17 1932 09/09/17 2138       Objective: Physical Exam: Vitals:   09/10/17 0640 09/10/17 0746 09/10/17 1339 09/10/17 1416  BP: 112/61   (!) 132/55  Pulse: 82 84 78 88  Resp: 18 18 17 17   Temp: 97.6 F (36.4 C)   98.2 F (36.8 C)  TempSrc: Oral   Oral  SpO2: 95% 94% 100% 98%  Weight:      Height:        Intake/Output Summary (Last 24 hours) at 09/10/17 1729 Last data filed at 09/10/17 1629  Gross per 24 hour  Intake          2140.05 ml  Output  500 ml  Net          1640.05 ml   Filed Weights   09/09/17 2304  Weight: 59.7 kg (131 lb 9.6 oz)   General: Alert, Awake and Oriented to Time, Place and Person. Appear in mild distress, affect appropriate Eyes: PERRL, Conjunctiva normal ENT: Oral Mucosa clear moist. Neck: no JVD, no Abnormal Mass Or lumps Cardiovascular: S1 and S2 Present, no Murmur, Peripheral Pulses Present Respiratory: normal respiratory effort, Bilateral Air entry equal and Decreased, no use of accessory muscle, right basila Crackles, no wheezes Abdomen: Bowel Sound present, Soft and no tenderness, no hernia Skin: no redness, no Rash, no  induration Extremities: no Pedal edema, no calf tenderness Neurologic: Grossly no focal neuro deficit. Bilaterally Equal motor strength  Data Reviewed: CBC:  Recent Labs Lab 09/09/17 1905 09/10/17 0548  WBC 14.2* 9.6  NEUTROABS 12.5*  --   HGB 12.8* 12.3*  HCT 38.3* 36.9*  MCV 95.5 96.1  PLT 177 381   Basic Metabolic Panel:  Recent Labs Lab 09/09/17 1905  NA 137  K 4.2  CL 102  CO2 26  GLUCOSE 147*  BUN 27*  CREATININE 1.18  CALCIUM 9.0    Liver Function Tests: No results for input(s): AST, ALT, ALKPHOS, BILITOT, PROT, ALBUMIN in the last 168 hours. No results for input(s): LIPASE, AMYLASE in the last 168 hours. No results for input(s): AMMONIA in the last 168 hours. Coagulation Profile: No results for input(s): INR, PROTIME in the last 168 hours. Cardiac Enzymes: No results for input(s): CKTOTAL, CKMB, CKMBINDEX, TROPONINI in the last 168 hours. BNP (last 3 results) No results for input(s): PROBNP in the last 8760 hours. CBG: No results for input(s): GLUCAP in the last 168 hours. Studies: Dg Chest 2 View  Result Date: 09/09/2017 CLINICAL DATA:  Productive cough and fever. EXAM: CHEST  2 VIEW COMPARISON:  12/07/2016. FINDINGS: Normal sized heart. Interval mild patchy opacity and small amount of pleural fluid at the right lung base. Interval linear density at the medial left lung base. Stable bilateral bullous changes. Unremarkable bones. IMPRESSION: 1. Right basilar pneumonia and small amount of pleural fluid. 2. Left basilar linear atelectasis. 3. Stable changes of COPD. Electronically Signed   By: Claudie Revering M.D.   On: 09/09/2017 19:38   Dg Swallowing Func-speech Pathology  Result Date: 09/10/2017 Objective Swallowing Evaluation: Type of Study: MBS-Modified Barium Swallow Study Patient Details Name: Andrew [name] MRN: [id number] Date of Birth: 04-12-1941 Today's Date: 09/10/2017 Time: SLP Start Time (ACUTE ONLY): 1302-SLP Stop Time (ACUTE ONLY): 1316 SLP Time  Calculation (min) (ACUTE ONLY): 14 min Past Medical History: Past Medical History: Diagnosis Date . BiPAP (biphasic positive airway pressure) dependence   Pt denies history of OSA . BPH (benign prostatic hyperplasia)  . Cancer ([place])   skin . COPD (chronic obstructive pulmonary disease) ([place])  . Dysphagia  . Emphysema lung ([place])  . GERD (gastroesophageal reflux disease)   " Silent reflux" . Hearing loss   right ear . HLD (hyperlipidemia)  . Hypertension  . Oxygen dependent   2-3 liters . Oxygen dependent  . Pneumonia  . Pulmonary hypertension ([place])  Past Surgical History: Past Surgical History: Procedure Laterality Date . CARDIAC CATHETERIZATION   . CATARACT EXTRACTION W/ INTRAOCULAR LENS  IMPLANT, BILATERAL   . ESOPHAGOGASTRODUODENOSCOPY (EGD) WITH PROPOFOL N/A 08/23/2016  Procedure: ESOPHAGOGASTRODUODENOSCOPY (EGD) WITH PROPOFOL;  Surgeon: Wilford Corner, MD;  Location: [facility] ENDOSCOPY;  Service: Endoscopy;  Laterality: N/A; . LUNG SURGERY   . TONSILLECTOMY  HPI: Pt is a 76 y.o.malewith medical history significant for COPD, chronic respiratory failure on 2 L oxygen at home, OSA on BiPAP, hypertension, hyperlipidemia, GERD, pulmonary hypertension, BPH, and dysphagia who presents with productive cough, shortness of breath and fever, diarrhea. CXR concerning for RLL PNA. Pt had a barium swallow in August 2018 that showed retention of barium in the valleculae and pyriform sinuses that cleared with second swallows. He also had a narrowing of the distal esophagus and occasional spasms of the distal esophagus. Per pt, GI believes he has age related changes with no tx recommended. He says his radiologist recommended use of a chin tuck after his barium swallow, and that he believes this helps him swallow better.  Subjective: pt says he has trouble swallowing but uses several strategies to help Assessment / Plan / Recommendation CHL IP CLINICAL IMPRESSIONS 09/10/2017 Clinical Impression Pt has a mild pharyngeal dysphagia  that is suspected to be secondary to known esophageal issues, although he does have some reduced epiglottic inversion. Otherwise his timing seems age appropriate and there is not significant concern for weakness, but he does seem to have mild-moderate vallecular residue after the swallow with all consistencies tested. He is able to clear most of this residue by spontaneously doing a dry swallow. Pt believes he can swallow better with use of a chin tuck, but this did not seem to change the amount of residue in his pharynx.One instance of trace penetration occurred with larger straw sips of thin liquids, with most of this also clearing spontaneously with extra swallows. A cued throat clear was effective at expelling what little remained, and pt says he clears his throat often during meals.  Although pt certainly has pharyngeal deficits, he appears to be compensating well for them. Even if occasional penetration is occuring, his throat clearing is likely protective. Recommend to continue current diet with use of esophageal and aspiration precautions. SLP will f/u briefly for tolerance and reinforcement of strategies. SLP Visit Diagnosis Dysphagia, pharyngoesophageal phase (R13.14) Attention and concentration deficit following -- Frontal lobe and executive function deficit following -- Impact on safety and function Mild aspiration risk   CHL IP TREATMENT RECOMMENDATION 09/10/2017 Treatment Recommendations Therapy as outlined in treatment plan below   Prognosis 09/10/2017 Prognosis for Safe Diet Advancement Good Barriers to Reach Goals Other (Comment) Barriers/Prognosis Comment -- CHL IP DIET RECOMMENDATION 09/10/2017 SLP Diet Recommendations Regular solids;Thin liquid Liquid Administration via Cup;Straw Medication Administration Crushed with puree Compensations Slow rate;Small sips/bites;Follow solids with liquid Postural Changes Remain semi-upright after after feeds/meals (Comment);Seated upright at 90 degrees   CHL IP  OTHER RECOMMENDATIONS 09/10/2017 Recommended Consults -- Oral Care Recommendations Oral care BID Other Recommendations --   CHL IP FOLLOW UP RECOMMENDATIONS 09/10/2017 Follow up Recommendations None   CHL IP FREQUENCY AND DURATION 09/10/2017 Speech Therapy Frequency (ACUTE ONLY) min 1 x/week Treatment Duration 1 week      CHL IP ORAL PHASE 09/10/2017 Oral Phase WFL Oral - Pudding Teaspoon -- Oral - Pudding Cup -- Oral - Honey Teaspoon -- Oral - Honey Cup -- Oral - Nectar Teaspoon -- Oral - Nectar Cup -- Oral - Nectar Straw -- Oral - Thin Teaspoon -- Oral - Thin Cup -- Oral - Thin Straw -- Oral - Puree -- Oral - Mech Soft -- Oral - Regular -- Oral - Multi-Consistency -- Oral - Pill -- Oral Phase - Comment --  CHL IP PHARYNGEAL PHASE 09/10/2017 Pharyngeal Phase Impaired Pharyngeal- Pudding Teaspoon -- Pharyngeal -- Pharyngeal- Pudding Cup --  Pharyngeal -- Pharyngeal- Honey Teaspoon -- Pharyngeal -- Pharyngeal- Honey Cup -- Pharyngeal -- Pharyngeal- Nectar Teaspoon -- Pharyngeal -- Pharyngeal- Nectar Cup -- Pharyngeal -- Pharyngeal- Nectar Straw -- Pharyngeal -- Pharyngeal- Thin Teaspoon -- Pharyngeal -- Pharyngeal- Thin Cup Reduced epiglottic inversion;Pharyngeal residue - valleculae Pharyngeal -- Pharyngeal- Thin Straw Reduced epiglottic inversion;Pharyngeal residue - valleculae;Penetration/Aspiration during swallow Pharyngeal Material enters airway, remains ABOVE vocal cords and not ejected out Pharyngeal- Puree Reduced epiglottic inversion;Pharyngeal residue - valleculae Pharyngeal -- Pharyngeal- Mechanical Soft Reduced epiglottic inversion;Pharyngeal residue - valleculae Pharyngeal -- Pharyngeal- Regular -- Pharyngeal -- Pharyngeal- Multi-consistency -- Pharyngeal -- Pharyngeal- Pill -- Pharyngeal -- Pharyngeal Comment --  CHL IP CERVICAL ESOPHAGEAL PHASE 09/10/2017 Cervical Esophageal Phase Impaired Pudding Teaspoon -- Pudding Cup -- Honey Teaspoon -- Honey Cup -- Nectar Teaspoon -- Nectar Cup -- Nectar Straw --  Thin Teaspoon -- Thin Cup Reduced cricopharyngeal relaxation Thin Straw Reduced cricopharyngeal relaxation Puree Reduced cricopharyngeal relaxation Mechanical Soft Reduced cricopharyngeal relaxation Regular -- Multi-consistency -- Pill -- Cervical Esophageal Comment -- No flowsheet data found. [name] 09/10/2017, 1:41 PM  [name], M.A. CCC-SLP [phone]              Scheduled Meds: . diltiazem  240 mg Oral QHS  . feeding supplement  1 Container Oral TID BM  . finasteride  5 mg Oral QHS  . ipratropium  0.5 mg Nebulization Q6H  . levalbuterol  0.63 mg Nebulization Q6H  . mouth rinse  15 mL Mouth Rinse BID  . methylPREDNISolone (SOLU-MEDROL) injection  60 mg Intravenous Q6H  . mometasone-formoterol  2 puff Inhalation BID  . multivitamin with minerals  1 tablet Oral Daily  . pantoprazole  40 mg Oral Daily  . rosuvastatin  10 mg Oral Daily  . sildenafil  20 mg Oral BID  . tamsulosin  0.4 mg Oral QPC breakfast  . temazepam  30 mg Oral QHS   Continuous Infusions: . sodium chloride 75 mL/hr at 09/10/17 1110  . azithromycin    . cefTRIAXone (ROCEPHIN)  IV    . heparin 1,050 Units/hr (09/10/17 1611)   PRN Meds: acetaminophen, hydrALAZINE, ondansetron (ZOFRAN) IV  Time spent: 35 minutes  Author: Berle Mull, MD [facility] Pager: [phone] 09/10/2017 5:29 PM  If 7PM-7AM, please contact night-coverage at [email or web address], password [facility]

## 2017-09-10 NOTE — Progress Notes (Signed)
ANTICOAGULATION CONSULT NOTE - Follow Up Consult  Pharmacy Consult for Heparin Indication: atrial fibrillation (new onset)  No Known Allergies  Patient Measurements: Height: 5\' 7"  (170.2 cm) Weight: 131 lb 9.6 oz (59.7 kg) IBW/kg (Calculated) : 66.1 Heparin Dosing Weight: 59 kg  Vital Signs: Temp: 98.2 F (36.8 C) (10/24 1416) Temp Source: Oral (10/24 1416) BP: 132/55 (10/24 1416) Pulse Rate: 88 (10/24 1416)  Labs:  Recent Labs  09/09/17 1905 09/10/17 0548 09/10/17 1042  HGB 12.8* 12.3*  --   HCT 38.3* 36.9*  --   PLT 177 163  --   HEPARINUNFRC  --   --  0.11*  CREATININE 1.18  --   --     Estimated Creatinine Clearance: 43.6 mL/min (by C-G formula based on SCr of 1.18 mg/dL).   Medications:  Scheduled:  . diltiazem  240 mg Oral QHS  . feeding supplement  1 Container Oral TID BM  . finasteride  5 mg Oral QHS  . ipratropium  0.5 mg Nebulization Q6H  . levalbuterol  0.63 mg Nebulization Q6H  . mouth rinse  15 mL Mouth Rinse BID  . methylPREDNISolone (SOLU-MEDROL) injection  60 mg Intravenous Q6H  . mometasone-formoterol  2 puff Inhalation BID  . multivitamin with minerals  1 tablet Oral Daily  . pantoprazole  40 mg Oral Daily  . rosuvastatin  10 mg Oral Daily  . sildenafil  20 mg Oral BID  . tamsulosin  0.4 mg Oral QPC breakfast  . temazepam  30 mg Oral QHS   Infusions:  . sodium chloride 75 mL/hr at 09/10/17 1110  . azithromycin    . cefTRIAXone (ROCEPHIN)  IV    . heparin 900 Units/hr (09/10/17 0310)    Assessment: 76 yo M initiated on heparin for new onset afib.  Pt has converted back to NSR following Diltiazem bolus.  Pt continues on heparin pending MD eval.  Currently heparin level is subtherapeutic on 900 units/hr.  No bleeding noted.  Goal of Therapy:  Heparin level 0.3-0.7 units/ml Monitor platelets by anticoagulation protocol: Yes   Plan:  Increase heparin to 1050 units/hr Heparin level in 6 hours Heparin level and CBC daily while on  heparin.  [employer], Pharm.D., BCPS Clinical Pharmacist Pager: [phone] Clinical phone for 09/10/2017 from 8:30-4:00 is [phone]. After 4pm, please call Main [facility] [phone]) for assistance. 09/10/2017 2:59 PM

## 2017-09-10 NOTE — Progress Notes (Signed)
ANTICOAGULATION CONSULT NOTE - Initial Consult  Pharmacy Consult for heparin Indication: atrial fibrillation  No Known Allergies  Patient Measurements: Height: 5\' 7"  (170.2 cm) Weight: 131 lb 9.6 oz (59.7 kg) IBW/kg (Calculated) : 66.1  Vital Signs: Temp: 99.1 F (37.3 C) (10/23 2304) Temp Source: Oral (10/23 2304) BP: 128/69 (10/24 0232) Pulse Rate: 85 (10/24 0024)  Labs:  Recent Labs  09/09/17 1905  HGB 12.8*  HCT 38.3*  PLT 177  CREATININE 1.18    Estimated Creatinine Clearance: 43.6 mL/min (by C-G formula based on SCr of 1.18 mg/dL).   Medical History: Past Medical History:  Diagnosis Date  . BiPAP (biphasic positive airway pressure) dependence    Pt denies history of OSA  . BPH (benign prostatic hyperplasia)   . Cancer ([place])    skin  . COPD (chronic obstructive pulmonary disease) ([place])   . Dysphagia   . Emphysema lung ([place])   . GERD (gastroesophageal reflux disease)    " Silent reflux"  . Hearing loss    right ear  . HLD (hyperlipidemia)   . Hypertension   . Oxygen dependent    2-3 liters  . Oxygen dependent   . Pneumonia   . Pulmonary hypertension ([facility])     Medications:  Prescriptions Prior to Admission  Medication Sig Dispense Refill Last Dose  . ADVAIR DISKUS 250-50 MCG/DOSE AEPB Inhale 1 puff into the lungs 2 (two) times daily. 60 each 2 09/09/2017 at Unknown time  . albuterol (PROVENTIL HFA;VENTOLIN HFA) 108 (90 Base) MCG/ACT inhaler Inhale 2 puffs into the lungs every 6 (six) hours as needed for wheezing or shortness of breath. 1 Inhaler 11 09/09/2017 at prn  . diltiazem (CARTIA XT) 240 MG 24 hr capsule Take 240 mg by mouth at bedtime.   09/08/2017 at Unknown time  . finasteride (PROSCAR) 5 MG tablet Take 5 mg by mouth at bedtime.    09/08/2017 at Unknown time  . fluticasone (FLONASE) 50 MCG/ACT nasal spray Place 1 spray into the nose daily as needed.   09/08/2017 at Unknown time  . ibuprofen (ADVIL,MOTRIN) 200 MG tablet Take 200 mg by mouth  every 6 (six) hours as needed for moderate pain.   unknown at prn  . ipratropium-albuterol (DUONEB) 0.5-2.5 (3) MG/3ML SOLN INHALE 1 VIAL VIA NEBULIZER EVERY 6 HOURS AS NEEDED (Patient taking differently: INHALE 1 VIAL VIA NEBULIZER EVERY three times a day) 3 mL 11 09/09/2017 at Unknown time  . Multiple Vitamins-Minerals (MULTIVITAMIN WITH MINERALS) tablet Take 1 tablet by mouth daily.   09/09/2017 at Unknown time  . omeprazole (PRILOSEC) 40 MG capsule Take 40 mg by mouth daily.   09/09/2017 at Unknown time  . OVER THE COUNTER MEDICATION Apply 1 application topically See admin instructions. Criticade use twice a day   09/09/2017 at Unknown time  . OXYGEN Inhale 2-3 L into the lungs continuous. 2L daytime, 3L at night   09/09/2017 at Unknown time  . polyethylene glycol (MIRALAX / GLYCOLAX) packet Take 17 g by mouth daily. (Patient taking differently: Take 17 g by mouth at bedtime. Mix in 8 oz liquid and drink) 14 each 1 09/08/2017 at Unknown time  . rosuvastatin (CRESTOR) 10 MG tablet Take 10 mg by mouth daily.   09/09/2017 at Unknown time  . sildenafil (REVATIO) 20 MG tablet Take 1 tablet (20 mg total) by mouth 2 (two) times daily. 180 tablet 3 09/09/2017 at Unknown time  . SPIRIVA HANDIHALER 18 MCG inhalation capsule INHALE THE CONTENTS OF 1 CAPSULE  EVERY DAY 90 capsule 3 09/09/2017 at Unknown time  . tamsulosin (FLOMAX) 0.4 MG CAPS capsule Take 1 capsule (0.4 mg total) by mouth daily after breakfast. (Patient taking differently: Take 0.4 mg by mouth at bedtime. ) 30 capsule 0 09/08/2017 at Unknown time  . temazepam (RESTORIL) 30 MG capsule Take 30 mg by mouth at bedtime.   09/08/2017 at Unknown time  . vitamin C (ASCORBIC ACID) 500 MG tablet Take 500 mg by mouth daily.   09/09/2017 at Unknown time   Scheduled:  . diltiazem  240 mg Oral QHS  . diltiazem  10 mg Intravenous Once  . finasteride  5 mg Oral QHS  . ipratropium-albuterol  3 mL Nebulization Q4H  . metroNIDAZOLE  500 mg Oral Q8H  .  mometasone-formoterol  2 puff Inhalation BID  . multivitamin with minerals  1 tablet Oral Daily  . pantoprazole  40 mg Oral Daily  . rosuvastatin  10 mg Oral Daily  . sildenafil  20 mg Oral BID  . tamsulosin  0.4 mg Oral QPC breakfast  . temazepam  30 mg Oral QHS  . tiotropium  18 mcg Inhalation Daily  . vitamin C  500 mg Oral Daily  . vitamin E  100 Units Oral Daily   Infusions:  . sodium chloride 125 mL/hr at 09/10/17 0012  . azithromycin    . cefTRIAXone (ROCEPHIN)  IV      Assessment: 76yo male c/o productive cough x4d associated w/ fever/chills, infiltrate on CXR, admitted for hypoxia, once tx'd to medical floor found to be in Afib to 130s confirmed on EKG, to begin heparin.  Goal of Therapy:  Heparin level 0.3-0.7 units/ml Monitor platelets by anticoagulation protocol: Yes   Plan:  Rec'd LMWH DVT Px at MN; will start heparin gtt at 900 units/hr and monitor heparin levels and CBC.  Wynona Neat, PharmD, BCPS  09/10/2017,2:44 AM

## 2017-09-10 NOTE — Progress Notes (Signed)
0523 Patient has converted back to sinus rhythm with pvc's and pac's .Will continue to monitor.

## 2017-09-10 NOTE — Progress Notes (Signed)
Placed patient on Bipap for the night with IPAP set at 10cm and EPAP set at 5cm as per home settings. Oxygen set at 4lpm with Sp02=96%

## 2017-09-10 NOTE — Progress Notes (Signed)
Patient heart rate down 110-115 remains in atrial fibrillation after 10 mg bolus of IV cardizem.Patient remains asymptomatic blood pressure 105/41.Text paged Dr.Kakrakandy with update awaiting a response.

## 2017-09-10 NOTE — Progress Notes (Signed)
  Echocardiogram 2D Echocardiogram has been performed.  Andrew [name] T Reginal [name] 09/10/2017, 2:58 PM

## 2017-09-10 NOTE — Progress Notes (Addendum)
Patient rhythm has changed to atrial fibrillation rate 120-130's.Patient denies chest pain at present time blood pressure 128/69.12 lead Ekg obtained to confirm atrial fibrillation patient is asymptomatic.Text paged Dr.Kakrakandy awaiting a response.

## 2017-09-10 NOTE — Evaluation (Signed)
Clinical/Bedside Swallow Evaluation Patient Details  Name: Andrew [name] MRN: [id number] Date of Birth: 11/19/1940  Today's Date: 09/10/2017 Time: SLP Start Time (ACUTE ONLY): 1062 SLP Stop Time (ACUTE ONLY): 1206 SLP Time Calculation (min) (ACUTE ONLY): 32 min  Past Medical History:  Past Medical History:  Diagnosis Date  . BiPAP (biphasic positive airway pressure) dependence    Pt denies history of OSA  . BPH (benign prostatic hyperplasia)   . Cancer ([place])    skin  . COPD (chronic obstructive pulmonary disease) ([place])   . Dysphagia   . Emphysema lung ([place])   . GERD (gastroesophageal reflux disease)    " Silent reflux"  . Hearing loss    right ear  . HLD (hyperlipidemia)   . Hypertension   . Oxygen dependent    2-3 liters  . Oxygen dependent   . Pneumonia   . Pulmonary hypertension ([place])    Past Surgical History:  Past Surgical History:  Procedure Laterality Date  . CARDIAC CATHETERIZATION    . CATARACT EXTRACTION W/ INTRAOCULAR LENS  IMPLANT, BILATERAL    . ESOPHAGOGASTRODUODENOSCOPY (EGD) WITH PROPOFOL N/A 08/23/2016   Procedure: ESOPHAGOGASTRODUODENOSCOPY (EGD) WITH PROPOFOL;  Surgeon: Wilford Corner, MD;  Location: [facility] ENDOSCOPY;  Service: Endoscopy;  Laterality: N/A;  . LUNG SURGERY    . TONSILLECTOMY     HPI:  Pt is a 76 y.o.malewith medical history significant for COPD, chronic respiratory failure on 2 L oxygen at home, OSA on BiPAP, hypertension, hyperlipidemia, GERD, pulmonary hypertension, BPH, and dysphagia who presents with productive cough, shortness of breath and fever, diarrhea. CXR concerning for RLL PNA. Pt had a barium swallow in August 2018 that showed retention of barium in the valleculae and pyriform sinuses that cleared with second swallows. He also had a narrowing of the distal esophagus and occasional spasms of the distal esophagus. Per pt, GI believes he has age related changes with no tx recommended. He says his radiologist recommended use of a  chin tuck after his barium swallow, and that he believes this helps him swallow better.    Assessment / Plan / Recommendation Clinical Impression  Pt has signs concerning for a primary esophageal dysphagia given known issues, but he also has intermittent throat clearing that could be concerning for secondary pharyngeal involvement. Particularly given his RLL PNA, recommend proceeding with MBS to better assess oropharyngeal function. SLP Visit Diagnosis: Dysphagia, unspecified (R13.10)    Aspiration Risk  Mild aspiration risk    Diet Recommendation Regular;Thin liquid   Liquid Administration via: Cup;Straw Medication Administration: Crushed with puree Supervision: Patient able to self feed;Intermittent supervision to cue for compensatory strategies Compensations: Slow rate;Small sips/bites;Follow solids with liquid Postural Changes: Seated upright at 90 degrees;Remain upright for at least 30 minutes after po intake    Other  Recommendations Oral Care Recommendations: Oral care BID   Follow up Recommendations        Frequency and Duration            Prognosis Prognosis for Safe Diet Advancement: Good      Swallow Study   General HPI: Pt is a 76 y.o.malewith medical history significant for COPD, chronic respiratory failure on 2 L oxygen at home, OSA on BiPAP, hypertension, hyperlipidemia, GERD, pulmonary hypertension, BPH, and dysphagia who presents with productive cough, shortness of breath and fever, diarrhea. CXR concerning for RLL PNA. Pt had a barium swallow in August 2018 that showed retention of barium in the valleculae and pyriform sinuses that cleared with second swallows.  He also had a narrowing of the distal esophagus and occasional spasms of the distal esophagus. Per pt, GI believes he has age related changes with no tx recommended. He says his radiologist recommended use of a chin tuck after his barium swallow, and that he believes this helps him swallow better.  Type of  Study: Bedside Swallow Evaluation Previous Swallow Assessment: see HPI - prior esophagram but no SLP evals Diet Prior to this Study: Regular;Thin liquids Temperature Spikes Noted: No Respiratory Status: Nasal cannula History of Recent Intubation: No Behavior/Cognition: Alert;Cooperative;Pleasant mood Oral Care Completed by SLP: No Oral Cavity - Dentition: Adequate natural dentition Vision: Functional for self-feeding Self-Feeding Abilities: Able to feed self Patient Positioning: Upright in bed Baseline Vocal Quality: Normal Volitional Cough: Strong Volitional Swallow: Able to elicit    Oral/Motor/Sensory Function Overall Oral Motor/Sensory Function: Within functional limits   Ice Chips Ice chips: Not tested   Thin Liquid Thin Liquid: Impaired Presentation: Cup;Self Fed;Straw Pharyngeal  Phase Impairments: Multiple swallows    Nectar Thick Nectar Thick Liquid: Not tested   Honey Thick Honey Thick Liquid: Not tested   Puree Puree: Impaired Presentation: Self Fed;Spoon Pharyngeal Phase Impairments: Multiple swallows;Throat Clearing - Immediate   Solid   GO   Solid: Impaired Presentation: Self Fed Pharyngeal Phase Impairments: Multiple swallows;Throat Clearing - Immediate        Andrew [name] 09/10/2017,1:31 PM   Andrew [name], M.A. CCC-SLP [phone]

## 2017-09-10 NOTE — ED Notes (Signed)
Pt received 1L bolus of normal saline, per Ronalee Belts, ED RN. Contacted Ann RN on 5W to inform nurse amount of fluids pt has received.

## 2017-09-10 NOTE — Progress Notes (Signed)
New orders received for 10 mg bolus IV cardizem .Rapid response called to assess patient.

## 2017-09-11 DIAGNOSIS — J181 Lobar pneumonia, unspecified organism: Secondary | ICD-10-CM

## 2017-09-11 DIAGNOSIS — I480 Paroxysmal atrial fibrillation: Secondary | ICD-10-CM

## 2017-09-11 DIAGNOSIS — J441 Chronic obstructive pulmonary disease with (acute) exacerbation: Secondary | ICD-10-CM

## 2017-09-11 LAB — MAGNESIUM: MAGNESIUM: 2.2 mg/dL (ref 1.7–2.4)

## 2017-09-11 LAB — GASTROINTESTINAL PANEL BY PCR, STOOL (REPLACES STOOL CULTURE)

## 2017-09-11 LAB — CBC WITH DIFFERENTIAL/PLATELET
BASOS ABS: 0 10*3/uL (ref 0.0–0.1)
Basophils Relative: 0 %
EOS PCT: 0 %
Eosinophils Absolute: 0 10*3/uL (ref 0.0–0.7)
HEMATOCRIT: 31.7 % — AB (ref 39.0–52.0)
Hemoglobin: 10.4 g/dL — ABNORMAL LOW (ref 13.0–17.0)
LYMPHS ABS: 0.4 10*3/uL — AB (ref 0.7–4.0)
LYMPHS PCT: 5 %
MCH: 31.3 pg (ref 26.0–34.0)
MCHC: 32.8 g/dL (ref 30.0–36.0)
MCV: 95.5 fL (ref 78.0–100.0)
Monocytes Absolute: 0.4 10*3/uL (ref 0.1–1.0)
Monocytes Relative: 5 %
NEUTROS ABS: 7.3 10*3/uL (ref 1.7–7.7)
NEUTROS PCT: 90 %
PLATELETS: 170 10*3/uL (ref 150–400)
RBC: 3.32 MIL/uL — ABNORMAL LOW (ref 4.22–5.81)
RDW: 13.5 % (ref 11.5–15.5)
WBC: 8.2 10*3/uL (ref 4.0–10.5)

## 2017-09-11 LAB — LEGIONELLA PNEUMOPHILA SEROGP 1 UR AG: L. PNEUMOPHILA SEROGP 1 UR AG: NEGATIVE

## 2017-09-11 LAB — COMPREHENSIVE METABOLIC PANEL
ALK PHOS: 63 U/L (ref 38–126)
ALT: 89 U/L — AB (ref 17–63)
AST: 86 U/L — AB (ref 15–41)
Albumin: 2.7 g/dL — ABNORMAL LOW (ref 3.5–5.0)
Anion gap: 9 (ref 5–15)
BUN: 26 mg/dL — AB (ref 6–20)
CALCIUM: 8.6 mg/dL — AB (ref 8.9–10.3)
CHLORIDE: 107 mmol/L (ref 101–111)
CO2: 23 mmol/L (ref 22–32)
CREATININE: 0.79 mg/dL (ref 0.61–1.24)
GFR calc Af Amer: >60 mL/min (ref >60)
Glucose, Bld: 150 mg/dL — ABNORMAL HIGH (ref 65–99)
Potassium: 4.4 mmol/L (ref 3.5–5.1)
Sodium: 139 mmol/L (ref 135–145)
Total Bilirubin: 0.5 mg/dL (ref 0.3–1.2)
Total Protein: 5.5 g/dL — ABNORMAL LOW (ref 6.5–8.1)

## 2017-09-11 MED ORDER — BENZONATATE 100 MG PO CAPS
100.0000 mg | ORAL_CAPSULE | Freq: Three times a day (TID) | ORAL | Status: DC
  Administered 2017-09-11 – 2017-09-12 (×5): 100 mg via ORAL
  Filled 2017-09-11 (×5): qty 1

## 2017-09-11 MED ORDER — HYDRALAZINE HCL 20 MG/ML IJ SOLN
5.0000 mg | Freq: Three times a day (TID) | INTRAMUSCULAR | Status: DC
  Administered 2017-09-11 – 2017-09-12 (×3): 5 mg via INTRAVENOUS
  Filled 2017-09-11 (×4): qty 1

## 2017-09-11 MED ORDER — LEVALBUTEROL HCL 0.63 MG/3ML IN NEBU
0.6300 mg | INHALATION_SOLUTION | Freq: Three times a day (TID) | RESPIRATORY_TRACT | Status: DC
  Administered 2017-09-11 – 2017-09-12 (×5): 0.63 mg via RESPIRATORY_TRACT
  Filled 2017-09-11 (×5): qty 3

## 2017-09-11 MED ORDER — IPRATROPIUM BROMIDE 0.02 % IN SOLN
0.5000 mg | Freq: Three times a day (TID) | RESPIRATORY_TRACT | Status: DC
  Administered 2017-09-11 – 2017-09-12 (×5): 0.5 mg via RESPIRATORY_TRACT
  Filled 2017-09-11 (×5): qty 2.5

## 2017-09-11 MED ORDER — APIXABAN 5 MG PO TABS
5.0000 mg | ORAL_TABLET | Freq: Two times a day (BID) | ORAL | Status: DC
  Administered 2017-09-11 – 2017-09-12 (×2): 5 mg via ORAL
  Filled 2017-09-11 (×2): qty 1

## 2017-09-11 MED ORDER — GUAIFENESIN ER 600 MG PO TB12
600.0000 mg | ORAL_TABLET | Freq: Two times a day (BID) | ORAL | Status: DC
  Administered 2017-09-11 – 2017-09-12 (×3): 600 mg via ORAL
  Filled 2017-09-11 (×3): qty 1

## 2017-09-11 MED ORDER — METOPROLOL TARTRATE 25 MG PO TABS
25.0000 mg | ORAL_TABLET | Freq: Two times a day (BID) | ORAL | Status: DC
  Administered 2017-09-11 – 2017-09-12 (×2): 25 mg via ORAL
  Filled 2017-09-11 (×2): qty 1

## 2017-09-11 NOTE — Progress Notes (Signed)
Weaned O2 to 4 lpm [place] due to SpO2 at 97%, pt wears 2 lpm [place]

## 2017-09-11 NOTE — Progress Notes (Signed)
[facility] Progress Note  Patient: Andrew [name] ASN:[id number]   PCP: Lujean Amel, MD DOB: [birth year]   DOA: 09/09/2017   DOS: 09/11/2017   Date of Service: the patient was seen and examined on 09/11/2017  Subjective: cough has worsened.  Brief hospital course: Pt. with PMH of COPD, chronic respiratory failure, OSA on BiPAP, HTN, GERD, BPH; admitted on 09/09/2017, presented with complaint of cough, was found to have pneumonia. Currently further plan is continue current antibiotics.  Assessment and Plan: 1.  Acute on chronic hypoxic respiratory failure. Right lower lobe pneumonia. COPD exacerbation. Continue IV ceftriaxone and azithromycin.  Continue duo nebs.  Influenza PCR, respiratory virus panel, urine Legionella and streptococcal antigens all negative. Blood culture negative for 24 hours. Patient was given aggressive IV hydration, will continue on gentle IV hydration for now. Speech therapy recommends dysphagia type I diet. Mucinex for cough. Steroids added.  2.  Diarrhea. Present on admission. C. difficile PCR negative. As needed Imodium and probiotics.  3.  A. fib with RVR.  Cardiology consulted.  Will monitor recommendation.  Continue Cardizem. Echocardiogram shows preserved EF. No complaints of chest pain chest tightness or chest pressure. Monitor.  History of pulmonary hypertension: -continue Sildenafil  BPH: stable - Continue Flomax and proscar  HLD: -Crestor  GERD: -Protonix  OSA - BiPAP  Diet: cardiac diet DVT Prophylaxis: subcutaneous Heparin  Advance goals of care discussion: full code  Family Communication: no family was present at bedside, at the time of interview.  Disposition:  Discharge to home.  Consultants: none Procedures: echo  Antibiotics: Anti-infectives    Start     Dose/Rate Route Frequency Ordered Stop   09/10/17 2100  azithromycin (ZITHROMAX) 500 mg in dextrose 5 % 250 mL IVPB     500 mg 250 mL/hr over 60  Minutes Intravenous Every 24 hours 09/09/17 2124 09/16/17 2059   09/10/17 2000  cefTRIAXone (ROCEPHIN) 1 g in dextrose 5 % 50 mL IVPB     1 g 100 mL/hr over 30 Minutes Intravenous Every 24 hours 09/09/17 2124 09/16/17 1959   09/10/17 0030  metroNIDAZOLE (FLAGYL) tablet 500 mg  Status:  Discontinued     500 mg Oral Every 8 hours 09/10/17 0024 09/10/17 0805   09/09/17 1945  cefTRIAXone (ROCEPHIN) 1 g in dextrose 5 % 50 mL IVPB     1 g 100 mL/hr over 30 Minutes Intravenous  Once 09/09/17 1932 09/09/17 2110   09/09/17 1945  azithromycin (ZITHROMAX) 500 mg in dextrose 5 % 250 mL IVPB     500 mg 250 mL/hr over 60 Minutes Intravenous  Once 09/09/17 1932 09/09/17 2138       Objective: Physical Exam: Vitals:   09/11/17 0438 09/11/17 0836 09/11/17 1427 09/11/17 1447  BP:    (!) 121/98  Pulse: 64   86  Resp:    20  Temp:    98.6 F (37 C)  TempSrc:    Oral  SpO2:  96% 97% 97%  Weight:      Height:        Intake/Output Summary (Last 24 hours) at 09/11/17 2004 Last data filed at 09/11/17 0200  Gross per 24 hour  Intake          1250.75 ml  Output              500 ml  Net           750.75 ml   Filed Weights   09/09/17 2304  Weight: 59.7 kg (  131 lb 9.6 oz)   General: Alert, Awake and Oriented to Time, Place and Person. Appear in mild distress, affect appropriate Eyes: PERRL, Conjunctiva normal ENT: Oral Mucosa clear moist. Neck: no JVD, no Abnormal Mass Or lumps Cardiovascular: S1 and S2 Present, no Murmur, Peripheral Pulses Present Respiratory: normal respiratory effort, Bilateral Air entry equal and Decreased, no use of accessory muscle, right basila Crackles, no wheezes Abdomen: Bowel Sound present, Soft and no tenderness, no hernia Skin: no redness, no Rash, no induration Extremities: no Pedal edema, no calf tenderness Neurologic: Grossly no focal neuro deficit. Bilaterally Equal motor strength  Data Reviewed: CBC:  Recent Labs Lab 09/09/17 1905 09/10/17 0548  09/11/17 0509  WBC 14.2* 9.6 8.2  NEUTROABS 12.5*  --  7.3  HGB 12.8* 12.3* 10.4*  HCT 38.3* 36.9* 31.7*  MCV 95.5 96.1 95.5  PLT 177 163 353   Basic Metabolic Panel:  Recent Labs Lab 09/09/17 1905 09/11/17 0509  NA 137 139  K 4.2 4.4  CL 102 107  CO2 26 23  GLUCOSE 147* 150*  BUN 27* 26*  CREATININE 1.18 0.79  CALCIUM 9.0 8.6*  MG  --  2.2    Liver Function Tests:  Recent Labs Lab 09/11/17 0509  AST 86*  ALT 89*  ALKPHOS 63  BILITOT 0.5  PROT 5.5*  ALBUMIN 2.7*   No results for input(s): LIPASE, AMYLASE in the last 168 hours. No results for input(s): AMMONIA in the last 168 hours. Coagulation Profile: No results for input(s): INR, PROTIME in the last 168 hours. Cardiac Enzymes: No results for input(s): CKTOTAL, CKMB, CKMBINDEX, TROPONINI in the last 168 hours. BNP (last 3 results) No results for input(s): PROBNP in the last 8760 hours. CBG: No results for input(s): GLUCAP in the last 168 hours. Studies: No results found.  Scheduled Meds: . apixaban  5 mg Oral BID  . benzonatate  100 mg Oral TID  . diltiazem  240 mg Oral QHS  . feeding supplement  1 Container Oral TID BM  . finasteride  5 mg Oral QHS  . guaiFENesin  600 mg Oral BID  . hydrALAZINE  5 mg Intravenous TID  . ipratropium  0.5 mg Nebulization TID  . levalbuterol  0.63 mg Nebulization TID  . mouth rinse  15 mL Mouth Rinse BID  . methylPREDNISolone (SOLU-MEDROL) injection  60 mg Intravenous Daily  . metoprolol tartrate  25 mg Oral BID  . mometasone-formoterol  2 puff Inhalation BID  . multivitamin with minerals  1 tablet Oral Daily  . pantoprazole  40 mg Oral Daily  . rosuvastatin  10 mg Oral Daily  . sildenafil  20 mg Oral BID  . tamsulosin  0.4 mg Oral QPC breakfast  . temazepam  30 mg Oral QHS   Continuous Infusions: . azithromycin Stopped (09/10/17 2303)  . cefTRIAXone (ROCEPHIN)  IV Stopped (09/10/17 2129)   PRN Meds: acetaminophen, ondansetron (ZOFRAN) IV  Time spent: 35  minutes  Author: Berle Mull, MD [facility] Pager: [phone] 09/11/2017 8:04 PM  If 7PM-7AM, please contact night-coverage at [email or web address], password [facility]

## 2017-09-11 NOTE — Procedures (Signed)
    Patient Name: Andrew [name], Andrew [name] Date: 09/04/2017 Gender: Male D.O.B: 06-28-1941 Age (years): 76 Referring Provider: Teressa Lower Height (inches): 53 Interpreting Physician: Chesley Mires MD, ABSM Weight (lbs): 140 RPSGT: Baxter Flattery BMI: 22 MRN: [id number] Neck Size: 16.00  CLINICAL INFORMATION 76 year old male with history of severe COPD and chronic respiratory failure was found to have sleep apnea and oxygen desaturation with home sleep study from 05/20/2017.  This showed an AHI of 6 and SaO2 low of 74%.  He is referred to the sleep lab for a titration study.  SLEEP STUDY TECHNIQUE As per the AASM Manual for the Scoring of Sleep and Associated Events v2.3 (April 2016) with a hypopnea requiring 4% desaturations.  The channels recorded and monitored were frontal, central and occipital EEG, electrooculogram (EOG), submentalis EMG (chin), nasal and oral airflow, thoracic and abdominal wall motion, anterior tibialis EMG, snore microphone, electrocardiogram, and pulse oximetry. Continuous positive airway pressure (CPAP) was initiated at the beginning of the study and titrated to treat sleep-disordered breathing.  MEDICATIONS Medications self-administered by patient taken the night of the study : RESTORIL, FLUTICASONE  TECHNICIAN COMMENTS Comments added by technician: Patient had difficulty initiating sleep. Patient was restless all through the night.  Comments added by scorer: N/A  RESPIRATORY PARAMETERS Optimal PAP Pressure (cm): 7 AHI at Optimal Pressure (/hr): 0.0 Overall Minimal O2 (%): 90.00 Supine % at Optimal Pressure (%): 0 Minimal O2 at Optimal Pressure (%): 90.0    He had oxygen desaturation lasting longer than 5 minutes in spite of having control of his sleep apnea with CPAP.  He had 1 liter oxygen added to CPAP with good control of his oxygen.  SLEEP ARCHITECTURE The study was initiated at 10:53:14 PM and ended at 5:18:14 AM.  Sleep onset time was 29.4 minutes  and the sleep efficiency was 45.7%. The total sleep time was 176.0 minutes.  The patient spent 4.55% of the night in stage N1 sleep, 89.77% in stage N2 sleep, 0.00% in stage N3 and 5.68% in REM.Stage REM latency was 335.5 minutes  Wake after sleep onset was 179.6. Alpha intrusion was absent. Supine sleep was 0.00%.  CARDIAC DATA The 2 lead EKG demonstrated sinus rhythm. The mean heart rate was 79.46 beats per minute. Other EKG findings include: None.  LEG MOVEMENT DATA The total Periodic Limb Movements of Sleep (PLMS) were 0. The PLMS index was 0.00. A PLMS index of <15 is considered normal in adults.  IMPRESSIONS - He did well with combination of CPAP 7 cm H2O and 1 liter supplemental oxygen.  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 - He was fitted with a Small size [employer] Full Face Mask Simplus mask and heated humidification.  [Electronically signed] 09/11/2017 02:44 PM  Chesley Mires MD, [place], American Board of Sleep Medicine   NPI: [id number]

## 2017-09-11 NOTE — Consult Note (Signed)
Cardiology Consultation:   Patient ID: Andrew [name]; [id number]; [birth year]   Admit date: 09/09/2017 Date of Consult: 09/11/2017  Primary Care Provider: Lujean Amel, MD Primary Cardiologist: Dr. Oval Linsey (new)   Patient Profile:   Andrew [name] is a 76 y.o. male with a hx of COPD, OSA on BiPAP, hypertension, and hyperlipidemia who is being seen today for the evaluation of atrial fibrillation at the request of Dr. Berle Mull.  History of Present Illness:   Andrew [name] was admitted 10/23 with cough and shortness of breath.  For several days prior to admission he reported shortness of breath and cough.  He also had green sputum production and fevers.  I he was found to have a right lower lobe pneumonia.  He was started on ceftriaxone and azithromycin.  Blood cultures,  respiratory viral panel, and influenza PCR have been negative.  During his hospitalization he was noted to have episodes of atrial fibrillation on telemetry.  His heart rates have been as high as the 130s.  He notes that this typically happens when he has a coughing spell.  He denies any chest pain but does feel like his heart is pounding.  He hasn't noted any lightheadedness or dizziness.  Andrew [name] had an echo this admission that revealed LVEF 55-60%.  He denies any similar episodes prior to admission.  Prior to hospitalization he was very active.  He went to the gym several times per week.  He had to stop frequently due to shortness of breath but had no chest pain.  He has not noted any lower extremity edema, orthopnea, or PND.  Past Medical History:  Diagnosis Date  . BiPAP (biphasic positive airway pressure) dependence    Pt denies history of OSA  . BPH (benign prostatic hyperplasia)   . Cancer ([place])    skin  . COPD (chronic obstructive pulmonary disease) ([place])   . Dysphagia   . Emphysema lung ([place])   . GERD (gastroesophageal reflux disease)    " Silent reflux"  . Hearing loss    right ear  . HLD  (hyperlipidemia)   . Hypertension   . Oxygen dependent    2-3 liters  . Oxygen dependent   . Pneumonia   . Pulmonary hypertension ([place])     Past Surgical History:  Procedure Laterality Date  . CARDIAC CATHETERIZATION    . CATARACT EXTRACTION W/ INTRAOCULAR LENS  IMPLANT, BILATERAL    . ESOPHAGOGASTRODUODENOSCOPY (EGD) WITH PROPOFOL N/A 08/23/2016   Procedure: ESOPHAGOGASTRODUODENOSCOPY (EGD) WITH PROPOFOL;  Surgeon: Wilford Corner, MD;  Location: [facility] ENDOSCOPY;  Service: Endoscopy;  Laterality: N/A;  . LUNG SURGERY    . TONSILLECTOMY       Home Medications:  Prior to Admission medications   Medication Sig Start Date End Date Taking? Authorizing Provider  ADVAIR DISKUS 250-50 MCG/DOSE AEPB Inhale 1 puff into the lungs 2 (two) times daily. 11/01/15  Yes Noralee Space, MD  albuterol (PROVENTIL HFA;VENTOLIN HFA) 108 (90 Base) MCG/ACT inhaler Inhale 2 puffs into the lungs every 6 (six) hours as needed for wheezing or shortness of breath. 04/17/17  Yes Noralee Space, MD  diltiazem (CARTIA XT) 240 MG 24 hr capsule Take 240 mg by mouth at bedtime.   Yes [provider]  finasteride (PROSCAR) 5 MG tablet Take 5 mg by mouth at bedtime.    Yes [provider]  fluticasone (FLONASE) 50 MCG/ACT nasal spray Place 1 spray into the nose daily as needed. 06/24/17  Yes [provider]  ibuprofen (ADVIL,MOTRIN) 200 MG tablet Take 200 mg by mouth every 6 (six) hours as needed for moderate pain.   Yes [provider]  ipratropium-albuterol (DUONEB) 0.5-2.5 (3) MG/3ML SOLN INHALE 1 VIAL VIA NEBULIZER EVERY 6 HOURS AS NEEDED Patient taking differently: INHALE 1 VIAL VIA NEBULIZER EVERY three times a day 08/19/17  Yes Noralee Space, MD  Multiple Vitamins-Minerals (MULTIVITAMIN WITH MINERALS) tablet Take 1 tablet by mouth daily.   Yes [provider]  omeprazole (PRILOSEC) 40 MG capsule Take 40 mg by mouth daily.   Yes [provider]  OVER THE COUNTER  MEDICATION Apply 1 application topically See admin instructions. Criticade use twice a day   Yes [provider]  OXYGEN Inhale 2-3 L into the lungs continuous. 2L daytime, 3L at night   Yes [provider]  polyethylene glycol (MIRALAX / GLYCOLAX) packet Take 17 g by mouth daily. Patient taking differently: Take 17 g by mouth at bedtime. Mix in 8 oz liquid and drink 05/08/16  Yes Mesner, Corene Cornea, MD  rosuvastatin (CRESTOR) 10 MG tablet Take 10 mg by mouth daily.   Yes [provider]  sildenafil (REVATIO) 20 MG tablet Take 1 tablet (20 mg total) by mouth 2 (two) times daily. 10/23/16  Yes Noralee Space, MD  SPIRIVA HANDIHALER 18 MCG inhalation capsule INHALE THE CONTENTS OF 1 CAPSULE EVERY DAY 06/03/17  Yes Noralee Space, MD  tamsulosin (FLOMAX) 0.4 MG CAPS capsule Take 1 capsule (0.4 mg total) by mouth daily after breakfast. Patient taking differently: Take 0.4 mg by mouth at bedtime.  01/03/16  Yes Nita Sells, MD  temazepam (RESTORIL) 30 MG capsule Take 30 mg by mouth at bedtime.   Yes [provider]  vitamin C (ASCORBIC ACID) 500 MG tablet Take 500 mg by mouth daily.   Yes [provider]    Inpatient Medications: Scheduled Meds: . benzonatate  100 mg Oral TID  . diltiazem  240 mg Oral QHS  . feeding supplement  1 Container Oral TID BM  . finasteride  5 mg Oral QHS  . guaiFENesin  600 mg Oral BID  . hydrALAZINE  5 mg Intravenous TID  . ipratropium  0.5 mg Nebulization TID  . levalbuterol  0.63 mg Nebulization TID  . mouth rinse  15 mL Mouth Rinse BID  . methylPREDNISolone (SOLU-MEDROL) injection  60 mg Intravenous Daily  . mometasone-formoterol  2 puff Inhalation BID  . multivitamin with minerals  1 tablet Oral Daily  . pantoprazole  40 mg Oral Daily  . rosuvastatin  10 mg Oral Daily  . sildenafil  20 mg Oral BID  . tamsulosin  0.4 mg Oral QPC breakfast  . temazepam  30 mg Oral QHS   Continuous Infusions: . azithromycin Stopped  (09/10/17 2303)  . cefTRIAXone (ROCEPHIN)  IV Stopped (09/10/17 2129)   PRN Meds: acetaminophen, ondansetron (ZOFRAN) IV  Allergies:   No Known Allergies  Social History:   Social History   Social History  . Marital status: Divorced    Spouse name: N/A  . Number of children: N/A  . Years of education: N/A   Occupational History  . Not on file.   Social History Main Topics  . Smoking status: Former Smoker    Packs/day: 0.00    Years: 0.00    Types: Cigarettes    Quit date: 10/01/1984  . Smokeless tobacco: Never Used  . Alcohol use No  . Drug use: No  . Sexual  activity: Not on file   Other Topics Concern  . Not on file   Social History Narrative  . No narrative on file    Family History:    Family History  Problem Relation Age of Onset  . Bone cancer Maternal Uncle      ROS:  Please see the history of present illness.  ROS   Physical Exam/Data:   Vitals:   09/11/17 0438 09/11/17 0836 09/11/17 1427 09/11/17 1447  BP:    (!) 121/98  Pulse: 64   86  Resp:    20  Temp:    98.6 F (37 C)  TempSrc:    Oral  SpO2:  96% 97% 97%  Weight:      Height:        Intake/Output Summary (Last 24 hours) at 09/11/17 1827 Last data filed at 09/11/17 0200  Gross per 24 hour  Intake          1250.75 ml  Output              500 ml  Net           750.75 ml   Filed Weights   09/09/17 2304  Weight: 59.7 kg (131 lb 9.6 oz)   VS:  BP 119/65 (BP Location: Right Arm)   Pulse 88   Temp 98.2 F (36.8 C) (Oral)   Resp (!) 23   Ht 5\' 7"  (1.702 m)   Wt 59.7 kg (131 lb 9.6 oz)   SpO2 95%   BMI 20.61 kg/m  , BMI Body mass index is 20.61 kg/m. GENERAL:  Frail, elderly man in mild respiratory distress.  Chronically ill-appearing HEENT: Pupils equal round and reactive, fundi not visualized, oral mucosa unremarkable NECK:  No jugular venous distention, waveform within normal limits, carotid upstroke brisk and symmetric, no bruits, no thyromegaly LUNGS: Diminished breath  sounds.  No crackles, wheezes, or rhonchi. HEART:  RRR.  PMI not displaced or sustained,S1 and S2 within normal limits, no S3, no S4, no clicks, no rubs, no murmurs ABD:  Flat, positive bowel sounds normal in frequency in pitch, no bruits, no rebound, no guarding, no midline pulsatile mass, no hepatomegaly, no splenomegaly EXT:  2 plus pulses throughout, no edema, no cyanosis no clubbing SKIN:  No rashes no nodules NEURO:  Cranial nerves II through XII grossly intact, motor grossly intact throughout PSYCH:  Cognitively intact, oriented to person place and time   EKG:  The EKG was personally reviewed and demonstrates:   09/10/17 at 2: 23 Atrial fibrillation rate 120 bpm.PVC.  Telemetry:  Telemetry was personally reviewed and demonstrates:  Atrial fibrillation with rates up to 130s.  Currently sinus rhythm.  Relevant CV Studies:  Echo 09/10/17: Study Conclusions  - Left ventricle: The cavity size was normal. Wall thickness was   normal. Systolic function was normal. The estimated ejection   fraction was in the range of 55% to 60%. Wall motion was normal;   there were no regional wall motion abnormalities. The study is   not technically sufficient to allow evaluation of LV diastolic   function. - Pulmonary arteries: PA peak pressure: 33 mm Hg (S). - Pericardium, extracardiac: A trivial pericardial effusion was   identified.  Impressions:  - Technically difficult; no apical views; normal LV systolic   function; trace MR and mild TR.  Laboratory Data:  Chemistry Recent Labs Lab 09/09/17 1905 09/11/17 0509  NA 137 139  K 4.2 4.4  CL 102 107  CO2 26 23  GLUCOSE 147* 150*  BUN 27* 26*  CREATININE 1.18 0.79  CALCIUM 9.0 8.6*  GFRNONAA 57* >60  GFRAA >60 >60  ANIONGAP 9 9     Recent Labs Lab 09/11/17 0509  PROT 5.5*  ALBUMIN 2.7*  AST 86*  ALT 89*  ALKPHOS 63  BILITOT 0.5   Hematology Recent Labs Lab 09/09/17 1905 09/10/17 0548 09/11/17 0509  WBC 14.2*  9.6 8.2  RBC 4.01* 3.84* 3.32*  HGB 12.8* 12.3* 10.4*  HCT 38.3* 36.9* 31.7*  MCV 95.5 96.1 95.5  MCH 31.9 32.0 31.3  MCHC 33.4 33.3 32.8  RDW 13.4 13.5 13.5  PLT 177 163 170   Cardiac EnzymesNo results for input(s): TROPONINI in the last 168 hours.  Recent Labs Lab 09/09/17 1916  TROPIPOC 0.01    BNPNo results for input(s): BNP, PROBNP in the last 168 hours.  DDimer No results for input(s): DDIMER in the last 168 hours.  Radiology/Studies:  Dg Chest 2 View  Result Date: 09/09/2017 CLINICAL DATA:  Productive cough and fever. EXAM: CHEST  2 VIEW COMPARISON:  12/07/2016. FINDINGS: Normal sized heart. Interval mild patchy opacity and small amount of pleural fluid at the right lung base. Interval linear density at the medial left lung base. Stable bilateral bullous changes. Unremarkable bones. IMPRESSION: 1. Right basilar pneumonia and small amount of pleural fluid. 2. Left basilar linear atelectasis. 3. Stable changes of COPD. Electronically Signed   By: Claudie Revering M.D.   On: 09/09/2017 19:38   Dg Swallowing Func-speech Pathology  Result Date: 09/10/2017 Objective Swallowing Evaluation: Type of Study: MBS-Modified Barium Swallow Study Patient Details Name: Andrew [name] MRN: [id number] Date of Birth: 06-04-1941 Today's Date: 09/10/2017 Time: SLP Start Time (ACUTE ONLY): 1302-SLP Stop Time (ACUTE ONLY): 1316 SLP Time Calculation (min) (ACUTE ONLY): 14 min Past Medical History: Past Medical History: Diagnosis Date . BiPAP (biphasic positive airway pressure) dependence   Pt denies history of OSA . BPH (benign prostatic hyperplasia)  . Cancer ([place])   skin . COPD (chronic obstructive pulmonary disease) ([place])  . Dysphagia  . Emphysema lung ([place])  . GERD (gastroesophageal reflux disease)   " Silent reflux" . Hearing loss   right ear . HLD (hyperlipidemia)  . Hypertension  . Oxygen dependent   2-3 liters . Oxygen dependent  . Pneumonia  . Pulmonary hypertension ([place])  Past Surgical History: Past  Surgical History: Procedure Laterality Date . CARDIAC CATHETERIZATION   . CATARACT EXTRACTION W/ INTRAOCULAR LENS  IMPLANT, BILATERAL   . ESOPHAGOGASTRODUODENOSCOPY (EGD) WITH PROPOFOL N/A 08/23/2016  Procedure: ESOPHAGOGASTRODUODENOSCOPY (EGD) WITH PROPOFOL;  Surgeon: Wilford Corner, MD;  Location: [facility] ENDOSCOPY;  Service: Endoscopy;  Laterality: N/A; . LUNG SURGERY   . TONSILLECTOMY   HPI: Pt is a 76 y.o.malewith medical history significant for COPD, chronic respiratory failure on 2 L oxygen at home, OSA on BiPAP, hypertension, hyperlipidemia, GERD, pulmonary hypertension, BPH, and dysphagia who presents with productive cough, shortness of breath and fever, diarrhea. CXR concerning for RLL PNA. Pt had a barium swallow in August 2018 that showed retention of barium in the valleculae and pyriform sinuses that cleared with second swallows. He also had a narrowing of the distal esophagus and occasional spasms of the distal esophagus. Per pt, GI believes he has age related changes with no tx recommended. He says his radiologist recommended use of a chin tuck after his barium swallow, and that he believes this helps him swallow better.  Subjective: pt says he has trouble swallowing  but uses several strategies to help Assessment / Plan / Recommendation CHL IP CLINICAL IMPRESSIONS 09/10/2017 Clinical Impression Pt has a mild pharyngeal dysphagia that is suspected to be secondary to known esophageal issues, although he does have some reduced epiglottic inversion. Otherwise his timing seems age appropriate and there is not significant concern for weakness, but he does seem to have mild-moderate vallecular residue after the swallow with all consistencies tested. He is able to clear most of this residue by spontaneously doing a dry swallow. Pt believes he can swallow better with use of a chin tuck, but this did not seem to change the amount of residue in his pharynx.One instance of trace penetration occurred with larger  straw sips of thin liquids, with most of this also clearing spontaneously with extra swallows. A cued throat clear was effective at expelling what little remained, and pt says he clears his throat often during meals.  Although pt certainly has pharyngeal deficits, he appears to be compensating well for them. Even if occasional penetration is occuring, his throat clearing is likely protective. Recommend to continue current diet with use of esophageal and aspiration precautions. SLP will f/u briefly for tolerance and reinforcement of strategies. SLP Visit Diagnosis Dysphagia, pharyngoesophageal phase (R13.14) Attention and concentration deficit following -- Frontal lobe and executive function deficit following -- Impact on safety and function Mild aspiration risk   CHL IP TREATMENT RECOMMENDATION 09/10/2017 Treatment Recommendations Therapy as outlined in treatment plan below   Prognosis 09/10/2017 Prognosis for Safe Diet Advancement Good Barriers to Reach Goals Other (Comment) Barriers/Prognosis Comment -- CHL IP DIET RECOMMENDATION 09/10/2017 SLP Diet Recommendations Regular solids;Thin liquid Liquid Administration via Cup;Straw Medication Administration Crushed with puree Compensations Slow rate;Small sips/bites;Follow solids with liquid Postural Changes Remain semi-upright after after feeds/meals (Comment);Seated upright at 90 degrees   CHL IP OTHER RECOMMENDATIONS 09/10/2017 Recommended Consults -- Oral Care Recommendations Oral care BID Other Recommendations --   CHL IP FOLLOW UP RECOMMENDATIONS 09/10/2017 Follow up Recommendations None   CHL IP FREQUENCY AND DURATION 09/10/2017 Speech Therapy Frequency (ACUTE ONLY) min 1 x/week Treatment Duration 1 week      CHL IP ORAL PHASE 09/10/2017 Oral Phase WFL Oral - Pudding Teaspoon -- Oral - Pudding Cup -- Oral - Honey Teaspoon -- Oral - Honey Cup -- Oral - Nectar Teaspoon -- Oral - Nectar Cup -- Oral - Nectar Straw -- Oral - Thin Teaspoon -- Oral - Thin Cup -- Oral -  Thin Straw -- Oral - Puree -- Oral - Mech Soft -- Oral - Regular -- Oral - Multi-Consistency -- Oral - Pill -- Oral Phase - Comment --  CHL IP PHARYNGEAL PHASE 09/10/2017 Pharyngeal Phase Impaired Pharyngeal- Pudding Teaspoon -- Pharyngeal -- Pharyngeal- Pudding Cup -- Pharyngeal -- Pharyngeal- Honey Teaspoon -- Pharyngeal -- Pharyngeal- Honey Cup -- Pharyngeal -- Pharyngeal- Nectar Teaspoon -- Pharyngeal -- Pharyngeal- Nectar Cup -- Pharyngeal -- Pharyngeal- Nectar Straw -- Pharyngeal -- Pharyngeal- Thin Teaspoon -- Pharyngeal -- Pharyngeal- Thin Cup Reduced epiglottic inversion;Pharyngeal residue - valleculae Pharyngeal -- Pharyngeal- Thin Straw Reduced epiglottic inversion;Pharyngeal residue - valleculae;Penetration/Aspiration during swallow Pharyngeal Material enters airway, remains ABOVE vocal cords and not ejected out Pharyngeal- Puree Reduced epiglottic inversion;Pharyngeal residue - valleculae Pharyngeal -- Pharyngeal- Mechanical Soft Reduced epiglottic inversion;Pharyngeal residue - valleculae Pharyngeal -- Pharyngeal- Regular -- Pharyngeal -- Pharyngeal- Multi-consistency -- Pharyngeal -- Pharyngeal- Pill -- Pharyngeal -- Pharyngeal Comment --  CHL IP CERVICAL ESOPHAGEAL PHASE 09/10/2017 Cervical Esophageal Phase Impaired Pudding Teaspoon -- Pudding Cup -- Honey Teaspoon --  Honey Cup -- Nectar Teaspoon -- Nectar Cup -- Nectar Straw -- Thin Teaspoon -- Thin Cup Reduced cricopharyngeal relaxation Thin Straw Reduced cricopharyngeal relaxation Puree Reduced cricopharyngeal relaxation Mechanical Soft Reduced cricopharyngeal relaxation Regular -- Multi-consistency -- Pill -- Cervical Esophageal Comment -- No flowsheet data found. [name] 09/10/2017, 1:41 PM  [name], M.A. CCC-SLP [phone]              Assessment and Plan:   # Paroxysmal atrial fibrillation:  Andrew [name] is currently back in sinus rhythm.  He has had more than one episode of atrial fibrillation.  This is likely  exacerbated by his current pneumonia and COPD exacerbation.  He has already been receiving diltiazem.   We will add metoprolol tartrate 25 mg twice daily.  He does not have much blood pressure room.  If he continues to have episodes of atrial fibrillation with rapid ventricular response, we will have to add an additional agent.  He was previously on heparin but this was discontinued.  We discussed the options for anticoagulation and he is willing to start Eliquis.  Echo showed normal systolic function.  We will check a TSH and free T4.   # Pulmonary hypertension:  Andrew [name] has been on sildenafil since 2007.  This is odd given that he has significant pulmonary disease.  Typically this medication would worsen V/Q mismatch in a patient with severe COPD.  He is working with Dr. Leeanne Deed and the dose has been lowered.  The patient has declined further weaning.   For questions or updates, please contact [place] Please consult [email or web address] for contact info under Cardiology/STEMI.   Signed, Skeet Latch, MD  09/11/2017 6:27 PM

## 2017-09-11 NOTE — Progress Notes (Signed)
ANTICOAGULATION CONSULT NOTE - Follow Up Consult  Pharmacy Consult for Heparin > Apixaban Indication: atrial fibrillation (new onset)  No Known Allergies  Patient Measurements: Height: 5\' 7"  (170.2 cm) Weight: 131 lb 9.6 oz (59.7 kg) IBW/kg (Calculated) : 66.1 Heparin Dosing Weight: 59 kg  Vital Signs: Temp: 98.6 F (37 C) (10/25 1447) Temp Source: Oral (10/25 1447) BP: 121/98 (10/25 1447) Pulse Rate: 86 (10/25 1447)  Labs:  Recent Labs  09/09/17 1905 09/10/17 0548 09/10/17 1042 09/11/17 0509  HGB 12.8* 12.3*  --  10.4*  HCT 38.3* 36.9*  --  31.7*  PLT 177 163  --  170  HEPARINUNFRC  --   --  0.11*  --   CREATININE 1.18  --   --  0.79    Estimated Creatinine Clearance: 64.3 mL/min (by C-G formula based on SCr of 0.79 mg/dL).   Medications:  Scheduled:  . apixaban  5 mg Oral BID  . benzonatate  100 mg Oral TID  . diltiazem  240 mg Oral QHS  . feeding supplement  1 Container Oral TID BM  . finasteride  5 mg Oral QHS  . guaiFENesin  600 mg Oral BID  . hydrALAZINE  5 mg Intravenous TID  . ipratropium  0.5 mg Nebulization TID  . levalbuterol  0.63 mg Nebulization TID  . mouth rinse  15 mL Mouth Rinse BID  . methylPREDNISolone (SOLU-MEDROL) injection  60 mg Intravenous Daily  . metoprolol tartrate  25 mg Oral BID  . mometasone-formoterol  2 puff Inhalation BID  . multivitamin with minerals  1 tablet Oral Daily  . pantoprazole  40 mg Oral Daily  . rosuvastatin  10 mg Oral Daily  . sildenafil  20 mg Oral BID  . tamsulosin  0.4 mg Oral QPC breakfast  . temazepam  30 mg Oral QHS   Infusions:  . azithromycin Stopped (09/10/17 2303)  . cefTRIAXone (ROCEPHIN)  IV Stopped (09/10/17 2129)    Assessment: 76 yo M initiated on heparin for new onset afib.  Pt has converted back to NSR following Diltiazem bolus now on diltiazem po.    No bleeding noted. Heparin drip stopped now to start apixaban 5mg  BID - dose ok age < 4, Cr < 1.5, Wt < 60kg.   Goal of Therapy:   Heparin level 0.3-0.7 units/ml Monitor platelets by anticoagulation protocol: Yes   Plan:  Apixaban 5mg  BID  Bonnita Nasuti Pharm.D. CPP, BCPS Clinical Pharmacist [phone] 09/11/2017 7:36 PM

## 2017-09-11 NOTE — Progress Notes (Signed)
  Speech Language Pathology Treatment: Dysphagia  Patient Details Name: Andrew [name] MRN: [id number] DOB: [birth year] Today's Date: 09/11/2017 Time: 4098-1191 SLP Time Calculation (min) (ACUTE ONLY): 12 min  Assessment / Plan / Recommendation Clinical Impression  Pt says that he has been coughing more today, both at baseline and during meals, resulting in limited intake as he held POs when he was having coughing spells. He reports some relief since he received Mucinex, but with thin liquid intake he does have multiple swallows and a delayed cough. This is consistent with MBS on previous date, when pt needed multiple swallows to clear pharyngeal residuals. At that time his cough was also protective. Pt is requesting a downgrade to a pureed diet, which he said he has used in the past while he was sick. He subjectively reports that the pureed solids are easier to swallow and acknowledges that the restrictions of this diet. Results of MBS were also reviewed on previous date, which include residue with thin liquids and purees as well. SLP will change diet per pt request to pureed solids, continuing thin liquids and use of same precautions. Will f/u for readiness to advance solids.    HPI HPI: Pt is a 76 y.o.malewith medical history significant for COPD, chronic respiratory failure on 2 L oxygen at home, OSA on BiPAP, hypertension, hyperlipidemia, GERD, pulmonary hypertension, BPH, and dysphagia who presents with productive cough, shortness of breath and fever, diarrhea. CXR concerning for RLL PNA. Pt had a barium swallow in August 2018 that showed retention of barium in the valleculae and pyriform sinuses that cleared with second swallows. He also had a narrowing of the distal esophagus and occasional spasms of the distal esophagus. Per pt, GI believes he has age related changes with no tx recommended. He says his radiologist recommended use of a chin tuck after his barium swallow, and that he believes this  helps him swallow better.       SLP Plan  Continue with current plan of care       Recommendations  Diet recommendations: Dysphagia 1 (puree);Thin liquid Liquids provided via: Cup;Straw Medication Administration: Crushed with puree Supervision: Patient able to self feed;Intermittent supervision to cue for compensatory strategies Compensations: Slow rate;Small sips/bites;Follow solids with liquid Postural Changes and/or Swallow Maneuvers: Seated upright 90 degrees;Upright 30-60 min after meal                Oral Care Recommendations: Oral care BID Follow up Recommendations: None SLP Visit Diagnosis: Dysphagia, pharyngoesophageal phase (R13.14) Plan: Continue with current plan of care       GO                [name] 09/11/2017, 1:51 PM  [name], M.A. CCC-SLP [phone]

## 2017-09-12 DIAGNOSIS — G4733 Obstructive sleep apnea (adult) (pediatric): Secondary | ICD-10-CM

## 2017-09-12 LAB — CBC
HCT: 33.4 % — ABNORMAL LOW (ref 39.0–52.0)
HEMOGLOBIN: 10.9 g/dL — AB (ref 13.0–17.0)
MCH: 31.1 pg (ref 26.0–34.0)
MCHC: 32.6 g/dL (ref 30.0–36.0)
MCV: 95.2 fL (ref 78.0–100.0)
PLATELETS: 199 10*3/uL (ref 150–400)
RBC: 3.51 MIL/uL — AB (ref 4.22–5.81)
RDW: 13.7 % (ref 11.5–15.5)
WBC: 9.9 10*3/uL (ref 4.0–10.5)

## 2017-09-12 LAB — BASIC METABOLIC PANEL
Anion gap: 8 (ref 5–15)
BUN: 26 mg/dL — ABNORMAL HIGH (ref 6–20)
CALCIUM: 9 mg/dL (ref 8.9–10.3)
CO2: 25 mmol/L (ref 22–32)
CREATININE: 0.81 mg/dL (ref 0.61–1.24)
Chloride: 109 mmol/L (ref 101–111)
Glucose, Bld: 128 mg/dL — ABNORMAL HIGH (ref 65–99)
Potassium: 4.3 mmol/L (ref 3.5–5.1)
SODIUM: 142 mmol/L (ref 135–145)

## 2017-09-12 LAB — TSH: TSH: 0.518 u[IU]/mL (ref 0.350–4.500)

## 2017-09-12 LAB — T4, FREE: FREE T4: 1.03 ng/dL (ref 0.61–1.12)

## 2017-09-12 LAB — MAGNESIUM: MAGNESIUM: 2.2 mg/dL (ref 1.7–2.4)

## 2017-09-12 MED ORDER — BOOST / RESOURCE BREEZE PO LIQD: 1.0000 | Freq: Three times a day (TID) | ORAL | 0 refills | Status: DC

## 2017-09-12 MED ORDER — IPRATROPIUM BROMIDE 0.02 % IN SOLN: 0.5000 mg | Freq: Four times a day (QID) | RESPIRATORY_TRACT | 0 refills | Status: DC | PRN

## 2017-09-12 MED ORDER — GUAIFENESIN ER 600 MG PO TB12: 600.0000 mg | ORAL_TABLET | Freq: Two times a day (BID) | ORAL | 0 refills | Status: AC

## 2017-09-12 MED ORDER — LEVALBUTEROL HCL 0.63 MG/3ML IN NEBU: 0.6300 mg | INHALATION_SOLUTION | Freq: Four times a day (QID) | RESPIRATORY_TRACT | 0 refills | Status: DC | PRN

## 2017-09-12 MED ORDER — APIXABAN 5 MG PO TABS: 5.0000 mg | ORAL_TABLET | Freq: Two times a day (BID) | ORAL | 0 refills | Status: DC

## 2017-09-12 MED ORDER — BENZONATATE 100 MG PO CAPS: 100.0000 mg | ORAL_CAPSULE | Freq: Three times a day (TID) | ORAL | 0 refills | Status: DC

## 2017-09-12 MED ORDER — METOPROLOL TARTRATE 25 MG PO TABS: 25.0000 mg | ORAL_TABLET | Freq: Two times a day (BID) | ORAL | 0 refills | Status: DC

## 2017-09-12 NOTE — Progress Notes (Signed)
Tech went to check on pt. Pt IV came out. VS were taken and stable. Blood was on Pt's gown and sheets. Pt was cleaned up and bed linen changed. IV restarted in right forearm this morning.

## 2017-09-12 NOTE — Evaluation (Signed)
Physical Therapy Evaluation Patient Details Name: Andrew [name] MRN: [id number] DOB: January 31, 1941 Today's Date: 09/12/2017   History of Present Illness  Pt is a 76 y.o. male with medical history significant for COPD, chronic respiratory failure on 2 L oxygen at home, OSA on BiPAP, hypertension, hyperlipidemia, GERD, pulmonary hypertension, BPH, and dysphagia who presents with productive cough, shortness of breath and fever, diarrhea. CXR concerning for RLL PNA.  Clinical Impression  Patient presents with problems listed below.  Will benefit from acute PT to maximize functional independence prior to discharge home with family.  Patient with general weakness and decreased balance, impacting mobility and gait.  This was patient's first time ambulating distances - patient weak and not at baseline.  Encouraged OOB and mobility.  Will continue to follow in am.  Feel patient will be ready for d/c from PT perspective following next session - should be close to baseline at that time.    Follow Up Recommendations No PT follow up;Supervision for mobility/OOB    Equipment Recommendations  None recommended by PT    Recommendations for Other Services       Precautions / Restrictions Precautions Precautions: Fall Precaution Comments: On O2 Restrictions Weight Bearing Restrictions: No      Mobility  Bed Mobility Overal bed mobility: Modified Independent             General bed mobility comments: Increased time.  Sits EOB for several seconds prior to standing for safety.  Transfers Overall transfer level: Needs assistance Equipment used: None Transfers: Sit to/from Stand Sit to Stand: Min guard         General transfer comment: Assist for balance/safety  Ambulation/Gait Ambulation/Gait assistance: Min guard Ambulation Distance (Feet): 80 Feet (80 x 2 with sitting rest break) Assistive device: None Gait Pattern/deviations: Step-through pattern;Decreased stride length;Shuffle;Narrow  base of support Gait velocity: decreased Gait velocity interpretation: Below normal speed for age/gender General Gait Details: Patient with slow, slightly unsteady gait.  Hold on to furniture/objects in room for balance.  Patient ambulated with O2 approx 80', with dyspnea 3-4/4.  Sat EOB for rest break, practicing pursed-lip breathing.  O2 sats at 91-93%.  Stairs            Wheelchair Mobility    Modified Rankin (Stroke Patients Only)       Balance Overall balance assessment: Needs assistance Sitting-balance support: No upper extremity supported;Feet supported Sitting balance-Leahy Scale: Good     Standing balance support: No upper extremity supported Standing balance-Leahy Scale: Good Standing balance comment: Patient with slightly unsteady gait, reaching for objects for balance.  Patient reports he is not at his baseline functional level.                             Pertinent Vitals/Pain Pain Assessment: No/denies pain    Home Living Family/patient expects to be discharged to:: Private residence Living Arrangements: Children Available Help at Discharge: Family;Available 24 hours/day ("Most of the time. Does not work") Type of Home: House Home Access: Level entry     Home Layout: One [place]: Shower seat      Prior Function Level of Independence: Independent         Comments: Has been fairly active.  Drives to gym daily to exercise.     Hand Dominance        Extremity/Trunk Assessment   Upper Extremity Assessment Upper Extremity Assessment: Overall WFL for tasks assessed    Lower  Extremity Assessment Lower Extremity Assessment: Generalized weakness       Communication   Communication: No difficulties  Cognition Arousal/Alertness: Awake/alert Behavior During Therapy: WFL for tasks assessed/performed Overall Cognitive Status: Within Functional Limits for tasks assessed                                         General Comments      Exercises     Assessment/Plan    PT Assessment Patient needs continued PT services  PT Problem List Decreased strength;Decreased activity tolerance;Decreased balance;Decreased mobility;Decreased knowledge of use of DME;Cardiopulmonary status limiting activity       PT Treatment Interventions DME instruction;Gait training;Functional mobility training;Therapeutic activities;Balance training;Patient/family education    PT Goals (Current goals can be found in the Care Plan section)  Acute Rehab PT Goals Patient Stated Goal: To ambulate safely PT Goal Formulation: With patient Time For Goal Achievement: 09/19/17 Potential to Achieve Goals: Good    Frequency Min 3X/week   Barriers to discharge        Co-evaluation               AM-PAC PT "6 Clicks" Daily Activity  Outcome Measure Difficulty turning over in bed (including adjusting bedclothes, sheets and blankets)?: None Difficulty moving from lying on back to sitting on the side of the bed? : None Difficulty sitting down on and standing up from a chair with arms (e.g., wheelchair, bedside commode, etc,.)?: A Little Help needed moving to and from a bed to chair (including a wheelchair)?: A Little Help needed walking in hospital room?: A Little Help needed climbing 3-5 steps with a railing? : A Little 6 Click Score: 20    End of Session Equipment Utilized During Treatment: Gait belt;Oxygen Activity Tolerance: Patient limited by fatigue Patient left: in bed;with call bell/phone within reach (sitting EOB for bath) Nurse Communication: Mobility status (ready for bath) PT Visit Diagnosis: Unsteadiness on feet (R26.81);Other abnormalities of gait and mobility (R26.89);Muscle weakness (generalized) (M62.81)    Time: 4196-2229 PT Time Calculation (min) (ACUTE ONLY): 18 min   Charges:   PT Evaluation $PT Eval Moderate Complexity: 1 Mod     PT G Codes:        Carita Pian. Sanjuana Kava, [facility] Acute  Rehab Services Pager [phone]   Despina Pole 09/12/2017, 1:41 PM

## 2017-09-12 NOTE — Progress Notes (Signed)
  Speech Language Pathology Treatment: Dysphagia  Patient Details Name: Yancey Pedley MRN: [id number] DOB: [birth year] Today's Date: 09/12/2017 Time: 9379-0240 SLP Time Calculation (min) (ACUTE ONLY): 10 min  Assessment / Plan / Recommendation Clinical Impression  Pt quite sleepy, declined POs, however he describes less coughing today in general.  We reviewed imaging from [facility], discussed basic precautions/strategies related to esophageal deficits. He verbalized understanding.  Lung sounds diminished, afebrile. Despite adequate ability to tolerate a regular diet, pt prefers to remain on puree/dys 1 until he is D/Cd home.  No further SLP f/u is warranted - our services will sign off.    HPI HPI: Pt is a 76 y.o.malewith medical history significant for COPD, chronic respiratory failure on 2 L oxygen at home, OSA on BiPAP, hypertension, hyperlipidemia, GERD, pulmonary hypertension, BPH, and dysphagia who presents with productive cough, shortness of breath and fever, diarrhea. CXR concerning for RLL PNA. Pt had a barium swallow in August 2018 that showed retention of barium in the valleculae and pyriform sinuses that cleared with second swallows. He also had a narrowing of the distal esophagus and occasional spasms of the distal esophagus. Per pt, GI believes he has age related changes with no tx recommended. He says his radiologist recommended use of a chin tuck after his barium swallow, and that he believes this helps him swallow better.       SLP Plan  All goals met       Recommendations  Diet recommendations: Dysphagia 1 (puree);Thin liquid Liquids provided via: Cup;Straw Medication Administration: Crushed with puree Supervision: Patient able to self feed;Intermittent supervision to cue for compensatory strategies Compensations: Slow rate;Small sips/bites;Follow solids with liquid Postural Changes and/or Swallow Maneuvers: Seated upright 90 degrees;Upright 30-60 min after meal                 Oral Care Recommendations: Oral care BID Follow up Recommendations: None SLP Visit Diagnosis: Dysphagia, pharyngoesophageal phase (R13.14) Plan: All goals met       GO                Juan Quam Laurice 09/12/2017, 11:07 AM

## 2017-09-12 NOTE — Consult Note (Signed)
            [facility] Primary Care Navigator  09/12/2017  Damon Hargrove [birth year] [id number]   Wentto seepatient at the bedside to identify possible discharge needs but he was alreadydischargedper RN report.  Patient was discharged home today.  Primary care provider's office called Horris Latino) to notify of patient's discharge and need for post hospital follow-up and transition of care. Notified of health issues needing follow-up.  Made aware to refer patient to [facility] care management if deemed necessary or appropriate for services.   For additional questions please contact:  Edwena Felty A. Luv Mish, BSN, RN-BC [facility] PRIMARY CARE Navigator Cell: [phone]

## 2017-09-12 NOTE — Care Management Important Message (Signed)
Important Message  Patient Details  Name: Andrew [name] MRN: [id number] Date of Birth: July 25, 1941   Medicare Important Message Given:  Yes    Nathen May 09/12/2017, 10:29 AM

## 2017-09-12 NOTE — Progress Notes (Signed)
Progress Note  Patient Name: Andrew [name] Date of Encounter: 09/12/2017  Primary Cardiologist: Dr. Oval Linsey (new)  Subjective   Feeling well.  Breathing is improving each day.   Inpatient Medications    Scheduled Meds: . apixaban  5 mg Oral BID  . benzonatate  100 mg Oral TID  . diltiazem  240 mg Oral QHS  . feeding supplement  1 Container Oral TID BM  . finasteride  5 mg Oral QHS  . guaiFENesin  600 mg Oral BID  . ipratropium  0.5 mg Nebulization TID  . levalbuterol  0.63 mg Nebulization TID  . mouth rinse  15 mL Mouth Rinse BID  . methylPREDNISolone (SOLU-MEDROL) injection  60 mg Intravenous Daily  . metoprolol tartrate  25 mg Oral BID  . mometasone-formoterol  2 puff Inhalation BID  . multivitamin with minerals  1 tablet Oral Daily  . pantoprazole  40 mg Oral Daily  . rosuvastatin  10 mg Oral Daily  . sildenafil  20 mg Oral BID  . tamsulosin  0.4 mg Oral QPC breakfast  . temazepam  30 mg Oral QHS   Continuous Infusions: . azithromycin Stopped (09/11/17 2342)  . cefTRIAXone (ROCEPHIN)  IV Stopped (09/11/17 2215)   PRN Meds: acetaminophen, ondansetron (ZOFRAN) IV   Vital Signs    Vitals:   09/12/17 0821 09/12/17 0936 09/12/17 1347 09/12/17 1403  BP:   (!) 108/52   Pulse:  85 74   Resp:   14   Temp:   98.3 F (36.8 C)   TempSrc:   Oral   SpO2: 96%  98% 95%  Weight:      Height:        Intake/Output Summary (Last 24 hours) at 09/12/17 1428 Last data filed at 09/12/17 1348  Gross per 24 hour  Intake              540 ml  Output                0 ml  Net              540 ml   Filed Weights   09/09/17 2304  Weight: 59.7 kg (131 lb 9.6 oz)    Telemetry    Sinus rhythm.  PACs.- Personally Reviewed  ECG    n/ - Personally Reviewed  Physical Exam   GEN:  Thin.  Chronically ill-appearing.  No acute distress.   Neck: No JVD.  No thyromegaly or bruits. Cardiac: RRR, no murmurs, rubs, or gallops.  Respiratory:  Diminished breath sounds.  No  crackles, wheezes, or rhonchi. GI: Soft, nontender, non-distended  MS: No edema; No deformity. Neuro:  Nonfocal  Psych: Normal affect   Labs    Chemistry Recent Labs Lab 09/09/17 1905 09/11/17 0509 09/12/17 0655  NA 137 139 142  K 4.2 4.4 4.3  CL 102 107 109  CO2 26 23 25   GLUCOSE 147* 150* 128*  BUN 27* 26* 26*  CREATININE 1.18 0.79 0.81  CALCIUM 9.0 8.6* 9.0  PROT  --  5.5*  --   ALBUMIN  --  2.7*  --   AST  --  86*  --   ALT  --  89*  --   ALKPHOS  --  63  --   BILITOT  --  0.5  --   GFRNONAA 57* >60 >60  GFRAA >60 >60 >60  ANIONGAP 9 9 8      Hematology Recent Labs Lab 09/10/17 0548 09/11/17 0017  09/12/17 0655  WBC 9.6 8.2 9.9  RBC 3.84* 3.32* 3.51*  HGB 12.3* 10.4* 10.9*  HCT 36.9* 31.7* 33.4*  MCV 96.1 95.5 95.2  MCH 32.0 31.3 31.1  MCHC 33.3 32.8 32.6  RDW 13.5 13.5 13.7  PLT 163 170 199    Cardiac EnzymesNo results for input(s): TROPONINI in the last 168 hours.  Recent Labs Lab 09/09/17 1916  TROPIPOC 0.01     BNPNo results for input(s): BNP, PROBNP in the last 168 hours.   DDimer No results for input(s): DDIMER in the last 168 hours.   Radiology    No results found.  Cardiac Studies   Echo 09/10/17: Study Conclusions  - Left ventricle: The cavity size was normal. Wall thickness was normal. Systolic function was normal. The estimated ejection fraction was in the range of 55% to 60%. Wall motion was normal; there were no regional wall motion abnormalities. The study is not technically sufficient to allow evaluation of LV diastolic function. - Pulmonary arteries: PA peak pressure: 33 mm Hg (S). - Pericardium, extracardiac: A trivial pericardial effusion was identified.  Impressions:  - Technically difficult; no apical views; normal LV systolic function; trace MR and mild TR.   Patient Profile      76 y.o. male with a hx of COPD, OSA on BiPAP, hypertension, and hyperlipidemia  here with pneumonia and found  to have paroxysmal atrial fibrillation with rapid ventricular response.  Assessment & Plan    # Paroxysmal atrial fibrillation:  Andrew [name]  remains in sinus rhythm.  He has had more than one episode of atrial fibrillation during the hospitalization.  This is likely exacerbated by his current pneumonia and COPD exacerbation.  He has already been taking diltiazem.     Metoprolol was added this admission.  He has not had any recurrent episodes since that addition.  He was started on  Eliquis for anticoagulation.  Thyroid function was normal.  # Pulmonary hypertension:  Andrew [name] has been on sildenafil since 2007.  This is odd given that he has significant pulmonary disease.  Typically this medication would worsen V/Q mismatch in a patient with severe COPD.  He is working with Dr. Leeanne Deed and the dose has been lowered.  The patient has declined further weaning.   We will arrange follow up.  OK to discharge from a cardiology standpoint.   For questions or updates, please contact [place] Please consult [email or web address] for contact info under Cardiology/STEMI.      Signed, Skeet Latch, MD  09/12/2017, 2:28 PM

## 2017-09-12 NOTE — Progress Notes (Signed)
Andrew [name] to be D/C'd Home per MD order.  Discussed with the patient and all questions fully answered.  VSS, Skin clean, dry and intact without evidence of skin break down, no evidence of skin tears noted. IV catheter discontinued intact. Site without signs and symptoms of complications. Dressing and pressure applied.  An After Visit Summary was printed and given to the patient. Patient received prescription.  D/c education completed with patient/family including follow up instructions, medication list, d/c activities limitations if indicated, with other d/c instructions as indicated by MD - patient able to verbalize understanding, all questions fully answered.   Patient instructed to return to ED, call 911, or call MD for any changes in condition.   Patient escorted via [name], and D/C home via private auto.  [name] 09/12/2017 7:02 PM

## 2017-09-14 LAB — CULTURE, BLOOD (ROUTINE X 2)
CULTURE: NO GROWTH
Culture: NO GROWTH
SPECIAL REQUESTS: ADEQUATE
Special Requests: ADEQUATE

## 2017-09-15 NOTE — Discharge Summary (Signed)
[facility] Discharge Summary   Patient: Andrew [name] IZT:[id number]   PCP: Lujean Amel, MD DOB: 01-11-1941   Date of admission: 09/09/2017   Date of discharge: 09/12/2017    Discharge Diagnoses:  Principal Problem:   Lobar pneumonia ([place]) Active Problems:   History of pulmonary hypertension   Acute on chronic respiratory failure with hypoxia (HCC)   BPH (benign prostatic hyperplasia)   Hypertension   Hyperlipidemia   COPD exacerbation (HCC)   GERD (gastroesophageal reflux disease)   OSA treated with BiPAP   Sepsis ([place])   Diarrhea   Malnutrition of moderate degree   Paroxysmal atrial fibrillation ([place])   Admitted From: Home Disposition: Home   Recommendations for Outpatient Follow-up:  1. Follow-up with PCP in 1 week  Follow-up Information    Koirala, Dibas, MD. Schedule an appointment as soon as possible for a visit in 1 week(s).   Specialty:  Family Medicine Contact information: [address] [place] [address] [phone]          Diet recommendation: Cardiac diet  Activity: The patient is advised to gradually reintroduce usual activities.  Discharge Condition: good  Code Status: Full code  History of present illness: As per the H and P dictated on admission, "Andrew [name] is a 76 y.o. male with medical history significant of COPD, chronic respiratory failure on 2 L oxygen at home, OSA on BiPAP, hypertension, hyperlipidemia, GERD, pulmonary hypertension, BPH, who presents with productive cough, shortness of breath and fever, diarrhea.  Patient states that he has been having cough, shortness of breath in the past 4 days, which has been progressively getting worse. He has greenish colored sputum production. He had chill and fever of 102 at home. Patient also reports diarrhea which has been going on for 4 days. He has 3-4 watery stool bowel movement each day. He was on MiraLAX, but his diarrhea continues after he stopped taking  MiraLAX. He has nausea, but no vomiting. He does not have abdominal pain at rest, but coughing induced some abdominal pain. Denies symptoms of UTI or unilateral weakness.  ED Course: pt was found to have  WBC 14.2, lactic acid 1.6, 1.66, creatinine 1.18, temperature 99.1, tachycardia, tachypnea, oxygen 87% on 2 L oxygen, negative troponin, chest x-ray showed right base infiltration with small amount of pleural effusion. Patient is admitted to telemetry bed as inpatient.  "  Hospital Course:  Summary of his active problems in the hospital is as following. 1.  Acute on chronic hypoxic respiratory failure. Right lower lobe pneumonia. COPD exacerbation. Treated with IV ceftriaxone and azithromycin.  Blood cultures negative, will switch to Keflex and oral azithromycin for 3 more days. Continue duo nebs.   Influenza PCR, respiratory virus panel, urine Legionella and streptococcal antigens all negative. Speech therapy recommends dysphagia type I diet. Mucinex for cough. Steroids added.  Continue short taper on discharge.  2.  Diarrhea. Present on admission. C. difficile PCR negative. As needed Imodium and probiotics.  3.  A. fib with RVR.   Cardiology consulted.  Will monitor recommendation.  Continue Cardizem. Echocardiogram shows preserved EF. No complaints of chest pain chest tightness or chest pressure. Monitor. Started on anti-coagulation, tolerating it well in the hospital.  Continue on discharge.  History of pulmonary hypertension: -continue Sildenafil  BPH: stable - Continue Flomax and proscar  HLD: -Crestor  GERD: -Protonix  OSA -BiPAP   All other chronic medical condition were stable during the hospitalization.  Patient was seen by physical therapy,  who recommended no PT follow up needed On the day of the discharge the patient's vitals were stable, and no other acute medical condition were reported by patient. the patient was felt safe to be discharge at  home with family.  Procedures and Results:  Echocardiogram    Consultations:  Cardiology  DISCHARGE MEDICATION: Discharge Medication List as of 09/12/2017  3:52 PM    START taking these medications   Details  apixaban (ELIQUIS) 5 MG TABS tablet Take 1 tablet (5 mg total) by mouth 2 (two) times daily., Starting Fri 09/12/2017, Normal    benzonatate (TESSALON) 100 MG capsule Take 1 capsule (100 mg total) by mouth 3 (three) times daily., Starting Fri 09/12/2017, Normal    feeding supplement (BOOST / RESOURCE BREEZE) LIQD Take 1 Container by mouth 3 (three) times daily between meals., Starting Fri 09/12/2017, Normal    guaiFENesin (MUCINEX) 600 MG 12 hr tablet Take 1 tablet (600 mg total) by mouth 2 (two) times daily., Starting Fri 09/12/2017, Normal    ipratropium (ATROVENT) 0.02 % nebulizer solution Take 2.5 mLs (0.5 mg total) by nebulization every 6 (six) hours as needed for wheezing or shortness of breath., Starting Fri 09/12/2017, Normal    levalbuterol (XOPENEX) 0.63 MG/3ML nebulizer solution Take 3 mLs (0.63 mg total) by nebulization every 6 (six) hours as needed for wheezing or shortness of breath., Starting Fri 09/12/2017, Normal    metoprolol tartrate (LOPRESSOR) 25 MG tablet Take 1 tablet (25 mg total) by mouth 2 (two) times daily., Starting Fri 09/12/2017, Normal      CONTINUE these medications which have NOT CHANGED   Details  ADVAIR DISKUS 250-50 MCG/DOSE AEPB Inhale 1 puff into the lungs 2 (two) times daily., Starting 11/01/2015, Until Discontinued, Normal    albuterol (PROVENTIL HFA;VENTOLIN HFA) 108 (90 Base) MCG/ACT inhaler Inhale 2 puffs into the lungs every 6 (six) hours as needed for wheezing or shortness of breath., Starting Thu 04/17/2017, Normal    diltiazem (CARTIA XT) 240 MG 24 hr capsule Take 240 mg by mouth at bedtime., Historical Med    finasteride (PROSCAR) 5 MG tablet Take 5 mg by mouth at bedtime. , Historical Med    fluticasone (FLONASE) 50 MCG/ACT  nasal spray Place 1 spray into the nose daily as needed., Starting Tue 06/24/2017, Historical Med    Multiple Vitamins-Minerals (MULTIVITAMIN WITH MINERALS) tablet Take 1 tablet by mouth daily., Until Discontinued, Historical Med    omeprazole (PRILOSEC) 40 MG capsule Take 40 mg by mouth daily., Historical Med    OVER THE COUNTER MEDICATION Apply 1 application topically See admin instructions. Criticade use twice a day, Historical Med    OXYGEN Inhale 2-3 L into the lungs continuous. 2L daytime, 3L at night, Historical Med    polyethylene glycol (MIRALAX / GLYCOLAX) packet Take 17 g by mouth daily., Starting Wed 05/08/2016, Print    rosuvastatin (CRESTOR) 10 MG tablet Take 10 mg by mouth daily., Until Discontinued, Historical Med    sildenafil (REVATIO) 20 MG tablet Take 1 tablet (20 mg total) by mouth 2 (two) times daily., Starting Wed 10/23/2016, Print    SPIRIVA HANDIHALER 18 MCG inhalation capsule INHALE THE CONTENTS OF 1 CAPSULE EVERY DAY, Print    tamsulosin (FLOMAX) 0.4 MG CAPS capsule Take 1 capsule (0.4 mg total) by mouth daily after breakfast., Starting 01/03/2016, Until Discontinued, Normal    temazepam (RESTORIL) 30 MG capsule Take 30 mg by mouth at bedtime., Until Discontinued, Historical Med    vitamin C (ASCORBIC ACID)  500 MG tablet Take 500 mg by mouth daily., Historical Med      STOP taking these medications     ibuprofen (ADVIL,MOTRIN) 200 MG tablet      ipratropium-albuterol (DUONEB) 0.5-2.5 (3) MG/3ML SOLN        No Known Allergies Discharge Instructions    Diet - low sodium heart healthy    Complete by:  As directed    Discharge instructions    Complete by:  As directed    It is important that you read following instructions as well as go over your medication list with RN to help you understand your care after this hospitalization.  Discharge Instructions: Please follow-up with PCP in one week  Please request your primary care physician to go over all  Hospital Tests and Procedure/Radiological results at the follow up,  Please get all Hospital records sent to your PCP by signing hospital release before you go home.   Do not take more than prescribed Pain, Sleep and Anxiety Medications. You were cared for by a hospitalist during your hospital stay. If you have any questions about your discharge medications or the care you received while you were in the hospital after you are discharged, you can call the unit and ask to speak with the hospitalist on call if the hospitalist that took care of you is not available.  Once you are discharged, your primary care physician will handle any further medical issues. Please note that NO REFILLS for any discharge medications will be authorized once you are discharged, as it is imperative that you return to your primary care physician (or establish a relationship with a primary care physician if you do not have one) for your aftercare needs so that they can reassess your need for medications and monitor your lab values. You Must read complete instructions/literature along with all the possible adverse reactions/side effects for all the Medicines you take and that have been prescribed to you. Take any new Medicines after you have completely understood and accept all the possible adverse reactions/side effects. Wear Seat belts while driving. If you have smoked or chewed Tobacco in the last 2 yrs please stop smoking and/or stop any Recreational drug use.   Increase activity slowly    Complete by:  As directed      Discharge Exam: Filed Weights   09/09/17 2304  Weight: 59.7 kg (131 lb 9.6 oz)   Vitals:   09/12/17 1347 09/12/17 1403  BP: (!) 108/52   Pulse: 74   Resp: 14   Temp: 98.3 F (36.8 C)   SpO2: 98% 95%   General: Appear in no distress, no Rash; Oral Mucosa moist. Cardiovascular: S1 and S2 Present, no Murmur, no JVD Respiratory: Bilateral Air entry present and Clear to Auscultation, no Crackles, no  wheezes Abdomen: Bowel Sound present, Soft and no tenderness Extremities: no Pedal edema, no calf tenderness Neurology: Grossly no focal neuro deficit.  The results of significant diagnostics from this hospitalization (including imaging, microbiology, ancillary and laboratory) are listed below for reference.    Significant Diagnostic Studies: Dg Chest 2 View  Result Date: 09/09/2017 CLINICAL DATA:  Productive cough and fever. EXAM: CHEST  2 VIEW COMPARISON:  12/07/2016. FINDINGS: Normal sized heart. Interval mild patchy opacity and small amount of pleural fluid at the right lung base. Interval linear density at the medial left lung base. Stable bilateral bullous changes. Unremarkable bones. IMPRESSION: 1. Right basilar pneumonia and small amount of pleural fluid. 2. Left basilar linear  atelectasis. 3. Stable changes of COPD. Electronically Signed   By: Claudie Revering M.D.   On: 09/09/2017 19:38   Dg Swallowing Func-speech Pathology  Result Date: 09/10/2017 Objective Swallowing Evaluation: Type of Study: MBS-Modified Barium Swallow Study Patient Details Name: Andrew [name] MRN: [id number] Date of Birth: [birth year] Today's Date: 09/10/2017 Time: SLP Start Time (ACUTE ONLY): 1302-SLP Stop Time (ACUTE ONLY): 1316 SLP Time Calculation (min) (ACUTE ONLY): 14 min Past Medical History: Past Medical History: Diagnosis Date . BiPAP (biphasic positive airway pressure) dependence   Pt denies history of OSA . BPH (benign prostatic hyperplasia)  . Cancer ([place])   skin . COPD (chronic obstructive pulmonary disease) ([place])  . Dysphagia  . Emphysema lung ([place])  . GERD (gastroesophageal reflux disease)   " Silent reflux" . Hearing loss   right ear . HLD (hyperlipidemia)  . Hypertension  . Oxygen dependent   2-3 liters . Oxygen dependent  . Pneumonia  . Pulmonary hypertension ([place])  Past Surgical History: Past Surgical History: Procedure Laterality Date . CARDIAC CATHETERIZATION   . CATARACT EXTRACTION W/ INTRAOCULAR LENS   IMPLANT, BILATERAL   . ESOPHAGOGASTRODUODENOSCOPY (EGD) WITH PROPOFOL N/A 08/23/2016  Procedure: ESOPHAGOGASTRODUODENOSCOPY (EGD) WITH PROPOFOL;  Surgeon: Wilford Corner, MD;  Location: [facility] ENDOSCOPY;  Service: Endoscopy;  Laterality: N/A; . LUNG SURGERY   . TONSILLECTOMY   HPI: Pt is a 76 y.o.malewith medical history significant for COPD, chronic respiratory failure on 2 L oxygen at home, OSA on BiPAP, hypertension, hyperlipidemia, GERD, pulmonary hypertension, BPH, and dysphagia who presents with productive cough, shortness of breath and fever, diarrhea. CXR concerning for RLL PNA. Pt had a barium swallow in August 2018 that showed retention of barium in the valleculae and pyriform sinuses that cleared with second swallows. He also had a narrowing of the distal esophagus and occasional spasms of the distal esophagus. Per pt, GI believes he has age related changes with no tx recommended. He says his radiologist recommended use of a chin tuck after his barium swallow, and that he believes this helps him swallow better.  Subjective: pt says he has trouble swallowing but uses several strategies to help Assessment / Plan / Recommendation CHL IP CLINICAL IMPRESSIONS 09/10/2017 Clinical Impression Pt has a mild pharyngeal dysphagia that is suspected to be secondary to known esophageal issues, although he does have some reduced epiglottic inversion. Otherwise his timing seems age appropriate and there is not significant concern for weakness, but he does seem to have mild-moderate vallecular residue after the swallow with all consistencies tested. He is able to clear most of this residue by spontaneously doing a dry swallow. Pt believes he can swallow better with use of a chin tuck, but this did not seem to change the amount of residue in his pharynx.One instance of trace penetration occurred with larger straw sips of thin liquids, with most of this also clearing spontaneously with extra swallows. A cued throat clear was  effective at expelling what little remained, and pt says he clears his throat often during meals.  Although pt certainly has pharyngeal deficits, he appears to be compensating well for them. Even if occasional penetration is occuring, his throat clearing is likely protective. Recommend to continue current diet with use of esophageal and aspiration precautions. SLP will f/u briefly for tolerance and reinforcement of strategies. SLP Visit Diagnosis Dysphagia, pharyngoesophageal phase (R13.14) Attention and concentration deficit following -- Frontal lobe and executive function deficit following -- Impact on safety and function Mild aspiration risk   CHL IP  TREATMENT RECOMMENDATION 09/10/2017 Treatment Recommendations Therapy as outlined in treatment plan below   Prognosis 09/10/2017 Prognosis for Safe Diet Advancement Good Barriers to Reach Goals Other (Comment) Barriers/Prognosis Comment -- CHL IP DIET RECOMMENDATION 09/10/2017 SLP Diet Recommendations Regular solids;Thin liquid Liquid Administration via Cup;Straw Medication Administration Crushed with puree Compensations Slow rate;Small sips/bites;Follow solids with liquid Postural Changes Remain semi-upright after after feeds/meals (Comment);Seated upright at 90 degrees   CHL IP OTHER RECOMMENDATIONS 09/10/2017 Recommended Consults -- Oral Care Recommendations Oral care BID Other Recommendations --   CHL IP FOLLOW UP RECOMMENDATIONS 09/10/2017 Follow up Recommendations None   CHL IP FREQUENCY AND DURATION 09/10/2017 Speech Therapy Frequency (ACUTE ONLY) min 1 x/week Treatment Duration 1 week      CHL IP ORAL PHASE 09/10/2017 Oral Phase WFL Oral - Pudding Teaspoon -- Oral - Pudding Cup -- Oral - Honey Teaspoon -- Oral - Honey Cup -- Oral - Nectar Teaspoon -- Oral - Nectar Cup -- Oral - Nectar Straw -- Oral - Thin Teaspoon -- Oral - Thin Cup -- Oral - Thin Straw -- Oral - Puree -- Oral - Mech Soft -- Oral - Regular -- Oral - Multi-Consistency -- Oral - Pill -- Oral  Phase - Comment --  CHL IP PHARYNGEAL PHASE 09/10/2017 Pharyngeal Phase Impaired Pharyngeal- Pudding Teaspoon -- Pharyngeal -- Pharyngeal- Pudding Cup -- Pharyngeal -- Pharyngeal- Honey Teaspoon -- Pharyngeal -- Pharyngeal- Honey Cup -- Pharyngeal -- Pharyngeal- Nectar Teaspoon -- Pharyngeal -- Pharyngeal- Nectar Cup -- Pharyngeal -- Pharyngeal- Nectar Straw -- Pharyngeal -- Pharyngeal- Thin Teaspoon -- Pharyngeal -- Pharyngeal- Thin Cup Reduced epiglottic inversion;Pharyngeal residue - valleculae Pharyngeal -- Pharyngeal- Thin Straw Reduced epiglottic inversion;Pharyngeal residue - valleculae;Penetration/Aspiration during swallow Pharyngeal Material enters airway, remains ABOVE vocal cords and not ejected out Pharyngeal- Puree Reduced epiglottic inversion;Pharyngeal residue - valleculae Pharyngeal -- Pharyngeal- Mechanical Soft Reduced epiglottic inversion;Pharyngeal residue - valleculae Pharyngeal -- Pharyngeal- Regular -- Pharyngeal -- Pharyngeal- Multi-consistency -- Pharyngeal -- Pharyngeal- Pill -- Pharyngeal -- Pharyngeal Comment --  CHL IP CERVICAL ESOPHAGEAL PHASE 09/10/2017 Cervical Esophageal Phase Impaired Pudding Teaspoon -- Pudding Cup -- Honey Teaspoon -- Honey Cup -- Nectar Teaspoon -- Nectar Cup -- Nectar Straw -- Thin Teaspoon -- Thin Cup Reduced cricopharyngeal relaxation Thin Straw Reduced cricopharyngeal relaxation Puree Reduced cricopharyngeal relaxation Mechanical Soft Reduced cricopharyngeal relaxation Regular -- Multi-consistency -- Pill -- Cervical Esophageal Comment -- No flowsheet data found. [name] 09/10/2017, 1:41 PM  [name], M.A. CCC-SLP [phone]              Microbiology: Recent Results (from the past 240 hour(s))  Gastrointestinal Panel by PCR , Stool     Status: None   Collection Time: 09/09/17  7:24 PM  Result Value Ref Range Status   Campylobacter species NOT DETECTED NOT DETECTED Final   Plesimonas shigelloides NOT DETECTED NOT DETECTED Final    Salmonella species NOT DETECTED NOT DETECTED Final   Yersinia enterocolitica NOT DETECTED NOT DETECTED Final   Vibrio species NOT DETECTED NOT DETECTED Final   Vibrio cholerae NOT DETECTED NOT DETECTED Final   Enteroaggregative E coli (EAEC) NOT DETECTED NOT DETECTED Final   Enteropathogenic E coli (EPEC) NOT DETECTED NOT DETECTED Final   Enterotoxigenic E coli (ETEC) NOT DETECTED NOT DETECTED Final   Shiga like toxin producing E coli (STEC) NOT DETECTED NOT DETECTED Final   Shigella/Enteroinvasive E coli (EIEC) NOT DETECTED NOT DETECTED Final   Cryptosporidium NOT DETECTED NOT DETECTED Final   Cyclospora cayetanensis NOT DETECTED NOT DETECTED Final  Entamoeba histolytica NOT DETECTED NOT DETECTED Final   Giardia lamblia NOT DETECTED NOT DETECTED Final   Adenovirus F40/41 NOT DETECTED NOT DETECTED Final   Astrovirus NOT DETECTED NOT DETECTED Final   Norovirus GI/GII NOT DETECTED NOT DETECTED Final   Rotavirus A NOT DETECTED NOT DETECTED Final   Sapovirus (I, II, IV, and V) NOT DETECTED NOT DETECTED Final  Blood culture (routine x 2)     Status: None   Collection Time: 09/09/17  8:01 PM  Result Value Ref Range Status   Specimen Description BLOOD LEFT ANTECUBITAL  Final   Special Requests   Final    BOTTLES DRAWN AEROBIC AND ANAEROBIC Blood Culture adequate volume   Culture NO GROWTH 5 DAYS  Final   Report Status 09/14/2017 FINAL  Final  Blood culture (routine x 2)     Status: None   Collection Time: 09/09/17  8:10 PM  Result Value Ref Range Status   Specimen Description BLOOD RIGHT ANTECUBITAL  Final   Special Requests   Final    BOTTLES DRAWN AEROBIC AND ANAEROBIC Blood Culture adequate volume   Culture NO GROWTH 5 DAYS  Final   Report Status 09/14/2017 FINAL  Final  Respiratory Panel by PCR     Status: None   Collection Time: 09/10/17 12:24 AM  Result Value Ref Range Status   Adenovirus NOT DETECTED NOT DETECTED Final   Coronavirus 229E NOT DETECTED NOT DETECTED Final    Coronavirus HKU1 NOT DETECTED NOT DETECTED Final   Coronavirus NL63 NOT DETECTED NOT DETECTED Final   Coronavirus OC43 NOT DETECTED NOT DETECTED Final   Metapneumovirus NOT DETECTED NOT DETECTED Final   Rhinovirus / Enterovirus NOT DETECTED NOT DETECTED Final   Influenza A NOT DETECTED NOT DETECTED Final   Influenza B NOT DETECTED NOT DETECTED Final   Parainfluenza Virus 1 NOT DETECTED NOT DETECTED Final   Parainfluenza Virus 2 NOT DETECTED NOT DETECTED Final   Parainfluenza Virus 3 NOT DETECTED NOT DETECTED Final   Parainfluenza Virus 4 NOT DETECTED NOT DETECTED Final   Respiratory Syncytial Virus NOT DETECTED NOT DETECTED Final   Bordetella pertussis NOT DETECTED NOT DETECTED Final   Chlamydophila pneumoniae NOT DETECTED NOT DETECTED Final   Mycoplasma pneumoniae NOT DETECTED NOT DETECTED Final  C difficile quick scan w PCR reflex     Status: None   Collection Time: 09/10/17 12:28 PM  Result Value Ref Range Status   C Diff antigen NEGATIVE NEGATIVE Final   C Diff toxin NEGATIVE NEGATIVE Final   C Diff interpretation No C. difficile detected.  Final     Labs: CBC:  Recent Labs Lab 09/09/17 1905 09/10/17 0548 09/11/17 0509 09/12/17 0655  WBC 14.2* 9.6 8.2 9.9  NEUTROABS 12.5*  --  7.3  --   HGB 12.8* 12.3* 10.4* 10.9*  HCT 38.3* 36.9* 31.7* 33.4*  MCV 95.5 96.1 95.5 95.2  PLT 177 163 170 384   Basic Metabolic Panel:  Recent Labs Lab 09/09/17 1905 09/11/17 0509 09/12/17 0655  NA 137 139 142  K 4.2 4.4 4.3  CL 102 107 109  CO2 26 23 25   GLUCOSE 147* 150* 128*  BUN 27* 26* 26*  CREATININE 1.18 0.79 0.81  CALCIUM 9.0 8.6* 9.0  MG  --  2.2 2.2   Liver Function Tests:  Recent Labs Lab 09/11/17 0509  AST 86*  ALT 89*  ALKPHOS 63  BILITOT 0.5  PROT 5.5*  ALBUMIN 2.7*   No results for input(s): LIPASE, AMYLASE in  the last 168 hours. No results for input(s): AMMONIA in the last 168 hours. Cardiac Enzymes: No results for input(s): CKTOTAL, CKMB, CKMBINDEX,  TROPONINI in the last 168 hours. BNP (last 3 results)  Recent Labs  12/07/16 1718  BNP 26.5   CBG: No results for input(s): GLUCAP in the last 168 hours. Time spent: 35 minutes  Signed:  Berle Mull  [facility] 09/12/2017  12:56 PM

## 2017-09-16 ENCOUNTER — Telehealth: Payer: Self-pay | Admitting: Pulmonary Disease

## 2017-09-16 NOTE — Telephone Encounter (Signed)
Spoke with pt, he states he had some pills stuck in his throat, he thinks they have went down now but I advised him to go to ER if he felt an airway blockage in his throat. He verbalized understanding. Nothing further is needed.

## 2017-09-19 DIAGNOSIS — Z79899 Other long term (current) drug therapy: Secondary | ICD-10-CM | POA: Diagnosis not present

## 2017-09-19 DIAGNOSIS — I10 Essential (primary) hypertension: Secondary | ICD-10-CM | POA: Diagnosis not present

## 2017-09-19 DIAGNOSIS — B370 Candidal stomatitis: Secondary | ICD-10-CM | POA: Diagnosis not present

## 2017-09-19 DIAGNOSIS — I2721 Secondary pulmonary arterial hypertension: Secondary | ICD-10-CM | POA: Diagnosis not present

## 2017-09-19 DIAGNOSIS — I482 Chronic atrial fibrillation: Secondary | ICD-10-CM | POA: Diagnosis not present

## 2017-09-19 DIAGNOSIS — J439 Emphysema, unspecified: Secondary | ICD-10-CM | POA: Diagnosis not present

## 2017-09-19 DIAGNOSIS — K219 Gastro-esophageal reflux disease without esophagitis: Secondary | ICD-10-CM | POA: Diagnosis not present

## 2017-09-22 ENCOUNTER — Encounter: Payer: Self-pay | Admitting: Pulmonary Disease

## 2017-09-22 ENCOUNTER — Ambulatory Visit (INDEPENDENT_AMBULATORY_CARE_PROVIDER_SITE_OTHER): Payer: Medicare Other | Admitting: Pulmonary Disease

## 2017-09-22 ENCOUNTER — Ambulatory Visit (INDEPENDENT_AMBULATORY_CARE_PROVIDER_SITE_OTHER)
Admission: RE | Admit: 2017-09-22 | Discharge: 2017-09-22 | Disposition: A | Discharge status: Home / Self Care | Payer: Medicare Other | Source: Ambulatory Visit | Attending: Pulmonary Disease | Admitting: Pulmonary Disease

## 2017-09-22 ENCOUNTER — Ambulatory Visit (INDEPENDENT_AMBULATORY_CARE_PROVIDER_SITE_OTHER): Payer: Medicare Other | Admitting: Cardiovascular Disease

## 2017-09-22 ENCOUNTER — Encounter: Payer: Self-pay | Admitting: Cardiovascular Disease

## 2017-09-22 VITALS — BP 115/60 | HR 61 | Ht 67.0 in | Wt 133.2 lb

## 2017-09-22 VITALS — BP 112/60 | HR 59 | Temp 98.0°F | Ht 67.0 in | Wt 132.5 lb

## 2017-09-22 DIAGNOSIS — J9611 Chronic respiratory failure with hypoxia: Secondary | ICD-10-CM

## 2017-09-22 DIAGNOSIS — R0600 Dyspnea, unspecified: Secondary | ICD-10-CM

## 2017-09-22 DIAGNOSIS — Z8679 Personal history of other diseases of the circulatory system: Secondary | ICD-10-CM

## 2017-09-22 DIAGNOSIS — J438 Other emphysema: Secondary | ICD-10-CM | POA: Diagnosis not present

## 2017-09-22 DIAGNOSIS — I272 Pulmonary hypertension, unspecified: Secondary | ICD-10-CM

## 2017-09-22 DIAGNOSIS — I480 Paroxysmal atrial fibrillation: Secondary | ICD-10-CM

## 2017-09-22 DIAGNOSIS — G4733 Obstructive sleep apnea (adult) (pediatric): Secondary | ICD-10-CM | POA: Diagnosis not present

## 2017-09-22 DIAGNOSIS — I10 Essential (primary) hypertension: Secondary | ICD-10-CM

## 2017-09-22 DIAGNOSIS — J432 Centrilobular emphysema: Secondary | ICD-10-CM | POA: Diagnosis not present

## 2017-09-22 DIAGNOSIS — R05 Cough: Secondary | ICD-10-CM | POA: Diagnosis not present

## 2017-09-22 DIAGNOSIS — Z9989 Dependence on other enabling machines and devices: Secondary | ICD-10-CM

## 2017-09-22 MED ORDER — BENZONATATE 100 MG PO CAPS: 100.0000 mg | ORAL_CAPSULE | Freq: Three times a day (TID) | ORAL | 4 refills | Status: DC

## 2017-09-22 MED ORDER — TEMAZEPAM 30 MG PO CAPS: 30.0000 mg | ORAL_CAPSULE | Freq: Every day | ORAL | 5 refills | Status: DC

## 2017-09-22 MED ORDER — BENZONATATE 100 MG PO CAPS
100.0000 mg | ORAL_CAPSULE | Freq: Three times a day (TID) | ORAL | 4 refills | Status: DC
Start: 2017-09-22 — End: 2017-09-22

## 2017-09-22 MED ORDER — FLUCONAZOLE 100 MG PO TABS: ORAL_TABLET | ORAL | 0 refills | Status: DC

## 2017-09-22 MED ORDER — PREDNISONE 20 MG PO TABS: ORAL_TABLET | ORAL | 0 refills | Status: DC

## 2017-09-22 NOTE — Progress Notes (Signed)
Cardiology Office Note   Date:  09/22/2017   ID:  Andrew [name], DOB [birth year], MRN [id number]  PCP:  Andrew Amel, MD  Cardiologist:   Andrew Latch, MD   No chief complaint on file.     History of Present Illness: Andrew [name] is a 76 y.o. male with paroxysmal atrial fibrillation, COPD, pulmonary hypertension, OSA on BiPAP, hypertension, and hyperlipidemia who presents for a follow-up.  Andrew [name] was admitted 08/2017 with pneumonia.  The hospitalization was complicated by atrial fibrillation with rapid ventricular response.  He had more than one episode of atrial fibrillation during the admission.  It was thought to be due to his pneumonia.  He was already on diltiazem prior to admission and metoprolol was added to his regimen.  He was started on Eliquis for anticoagulation.  Thyroid function was normal.  He had an echo that admission that revealed LVEF 55-60%.  It was noted that he has pulmonary hypertension from severe COPD.  He was previously treated in [place] and was started on sildenafil.  He has been working with Andrew [name] the dose was lowered however, the patient declined further weaning.    After leaving the hospital Andrew [name] initially felt well.  However over the last several days he has been getting increasingly short of breath with exertion.  He had a slight fever to 100.63 days ago and has been feeling generally fatigued.  He saw his primary care doctor and his pulmonologist.  He was restarted on prednisone today.  He is waiting for the results of a chest x-ray that was performed today.  He has noted some yellow phlegm.  He denies chest pain, lower extremity edema, orthopnea, or PND.  He has not noted any palpitations, lightheadedness or dizziness.  He did not know when he was in atrial fibrillation in the hospital.   Past Medical History:  Diagnosis Date  . BiPAP (biphasic positive airway pressure) dependence    Pt denies history of OSA  . BPH (benign prostatic  hyperplasia)   . Cancer ([place])    skin  . COPD (chronic obstructive pulmonary disease) ([place])   . Dysphagia   . Emphysema lung ([place])   . GERD (gastroesophageal reflux disease)    " Silent reflux"  . Hearing loss    right ear  . HLD (hyperlipidemia)   . Hypertension   . Oxygen dependent    2-3 liters  . Oxygen dependent   . Pneumonia   . Pulmonary hypertension ([place])     Past Surgical History:  Procedure Laterality Date  . CARDIAC CATHETERIZATION    . CATARACT EXTRACTION W/ INTRAOCULAR LENS  IMPLANT, BILATERAL    . LUNG SURGERY    . TONSILLECTOMY       Current Outpatient Medications  Medication Sig Dispense Refill  . ADVAIR DISKUS 250-50 MCG/DOSE AEPB Inhale 1 puff into the lungs 2 (two) times daily. 60 each 2  . albuterol (PROVENTIL HFA;VENTOLIN HFA) 108 (90 Base) MCG/ACT inhaler Inhale 2 puffs into the lungs every 6 (six) hours as needed for wheezing or shortness of breath. 1 Inhaler 11  . apixaban (ELIQUIS) 5 MG TABS tablet Take 1 tablet (5 mg total) by mouth 2 (two) times daily. 60 tablet 0  . benzonatate (TESSALON) 100 MG capsule Take 1 capsule (100 mg total) 3 (three) times daily by mouth. 90 capsule 4  . diltiazem (CARTIA XT) 240 MG 24 hr capsule Take 240 mg by mouth at bedtime.    . feeding  supplement (BOOST / RESOURCE BREEZE) LIQD Take 1 Container by mouth 3 (three) times daily between meals. 30 Container 0  . finasteride (PROSCAR) 5 MG tablet Take 5 mg by mouth at bedtime.     . fluconazole (DIFLUCAN) 100 MG tablet Take 2 tablets now then 1 daily until gone 6 tablet 0  . fluticasone (FLONASE) 50 MCG/ACT nasal spray Place 1 spray into the nose daily as needed.    Marland Kitchen guaiFENesin (MUCINEX) 600 MG 12 hr tablet Take 1 tablet (600 mg total) by mouth 2 (two) times daily. 30 tablet 0  . ipratropium (ATROVENT) 0.02 % nebulizer solution Take 2.5 mLs (0.5 mg total) by nebulization every 6 (six) hours as needed for wheezing or shortness of breath. 75 mL 0  . metoprolol tartrate  (LOPRESSOR) 25 MG tablet Take 1 tablet (25 mg total) by mouth 2 (two) times daily. 60 tablet 0  . Multiple Vitamins-Minerals (MULTIVITAMIN WITH MINERALS) tablet Take 1 tablet by mouth daily.    Marland Kitchen omeprazole (PRILOSEC) 40 MG capsule Take 40 mg by mouth daily.    Marland Kitchen OVER THE COUNTER MEDICATION Apply 1 application See admin instructions topically. Diaper rash    . OXYGEN Inhale 2-3 L into the lungs continuous. 2L daytime, 3L at night    . polyethylene glycol (MIRALAX / GLYCOLAX) packet Take 17 g by mouth daily. (Patient taking differently: Take 17 g by mouth at bedtime. Mix in 8 oz liquid and drink) 14 each 1  . predniSONE (DELTASONE) 20 MG tablet Take as directed 30 tablet 0  . rosuvastatin (CRESTOR) 10 MG tablet Take 10 mg by mouth daily.    . sildenafil (REVATIO) 20 MG tablet Take 1 tablet (20 mg total) by mouth 2 (two) times daily. 180 tablet 3  . SPIRIVA HANDIHALER 18 MCG inhalation capsule INHALE THE CONTENTS OF 1 CAPSULE EVERY DAY 90 capsule 3  . tamsulosin (FLOMAX) 0.4 MG CAPS capsule Take 1 capsule (0.4 mg total) by mouth daily after breakfast. (Patient taking differently: Take 0.4 mg by mouth at bedtime. ) 30 capsule 0  . temazepam (RESTORIL) 30 MG capsule Take 1 capsule (30 mg total) at bedtime by mouth. 30 capsule 5  . vitamin C (ASCORBIC ACID) 500 MG tablet Take 500 mg by mouth daily.     No current facility-administered medications for this visit.     Allergies:   Patient has no known allergies.    Social History:  The patient  reports that he quit smoking about 32 years ago. His smoking use included cigarettes. He smoked 0.00 packs per day for 0.00 years. he has never used smokeless tobacco. He reports that he does not drink alcohol or use drugs.   Family History:  The patient's family history includes Bone cancer in his brother; Cancer in his maternal uncle; Congestive Heart Failure in his maternal grandfather; Pancreatitis in his mother; Stroke in his maternal grandfather.     ROS:  Please see the history of present illness.   Otherwise, review of systems are positive for none.   All other systems are reviewed and negative.   PHYSICAL EXAM: VS:  BP 115/60   Pulse 61   Ht 5\' 7"  (1.702 m)   Wt 60.4 kg (133 lb 3.2 oz)   BMI 20.86 kg/m  , BMI Body mass index is 20.86 kg/m. GENERAL:  Chronically ill-appearing HEENT:  Pupils equal round and reactive, fundi not visualized, oral mucosa unremarkable NECK:  No jugular venous distention, waveform within normal limits, carotid  upstroke brisk and symmetric, no bruits, no thyromegaly LYMPHATICS:  No cervical adenopathy LUNGS:  Clear to auscultation bilaterally.  Diminished air movement.   HEART:  RRR.  PMI not displaced or sustained,S1 and S2 within normal limits, no S3, no S4, no clicks, no rubs, no murmurs ABD:  Flat, positive bowel sounds normal in frequency in pitch, no bruits, no rebound, no guarding, no midline pulsatile mass, no hepatomegaly, no splenomegaly EXT:  2 plus pulses throughout, no edema, no cyanosis no clubbing SKIN:  No rashes no nodules NEURO:  Cranial nerves II through XII grossly intact, motor grossly intact throughout PSYCH:  Cognitively intact, oriented to person place and time   EKG:  EKG is ordered today. The ekg ordered today demonstrates sinus rhythm.  Rate 61 bpm.    Echo 09/10/17: Study Conclusions  - Left ventricle: The cavity size was normal. Wall thickness was normal. Systolic function was normal. The estimated ejection fraction was in the range of 55% to 60%. Wall motion was normal; there were no regional wall motion abnormalities. The study is not technically sufficient to allow evaluation of LV diastolic function. - Pulmonary arteries: PA peak pressure: 33 mm Hg (S). - Pericardium, extracardiac: A trivial pericardial effusion was identified.  Impressions:  - Technically difficult; no apical views; normal LV systolic function; trace MR and mild  TR.    Recent Labs: 12/07/2016: B Natriuretic Peptide 26.5 09/11/2017: ALT 89 09/12/2017: BUN 26; Creatinine, Ser 0.81; Hemoglobin 10.9; Magnesium 2.2; Platelets 199; Potassium 4.3; Sodium 142; TSH 0.518    Lipid Panel No results found for: CHOL, TRIG, HDL, CHOLHDL, VLDL, LDLCALC, LDLDIRECT    Wt Readings from Last 3 Encounters:  09/22/17 60.4 kg (133 lb 3.2 oz)  09/22/17 60.1 kg (132 lb 8 oz)  09/09/17 59.7 kg (131 lb 9.6 oz)      ASSESSMENT AND PLAN:  # Paroxysmal atrial fibrillation:  Mr. Durnin remains in sinus rhythm.  He hasn't experienced any palpitations.  Continue metoprolol, diltiazem and Eliquis.   # Pulmonary hypertension:  Mr. Nieto has been on sildenafil since 2007. PASP was only 33 mmHg on echo 09/10/17. This is odd given that he has significant pulmonary disease. Typically this medication would worsen V/Q mismatch in a patient with severe COPD.       Current medicines are reviewed at length with the patient today.  The patient does not have concerns regarding medicines.  The following changes have been made:  no change  Labs/ tests ordered today include:  No orders of the defined types were placed in this encounter.    Disposition:   FU with Tyner Codner C. Oval Linsey, MD, [facility] in 6 months.     This note was written with the assistance of speech recognition software.  Please excuse any transcriptional errors.  Signed, [place] Droll C. Oval Linsey, MD, [facility]  09/22/2017 5:55 PM    [place]

## 2017-09-22 NOTE — Patient Instructions (Signed)
Today we updated your med list in our EPIC system...    Continue your current medications the same...    We refilled your TESSALON [place] to take as needed for cough...  We wrote for a short course of DIFLUCAN for thrush- 2 tabs now then one daily til gone...  We decided to place you back on a slightly longer course of PREDNISONE (20mg ) tapering to a low dose over a longer interval>    Start with one tab twice daily for 3 days,    Then decrease to 1 tab each AM for 3 days,    Then decrease to 1/2 tab daily each AM for 3 days,    Then decrease to 1/2 tab EVERY OTHER DAY til return visit... NOTE> if your breathing worsens when you taper to the every other day dose- then increase back to 1/2 tab daily & let me know...  Continue your baseline DUONEB 3 times daily (extra if needed) & follow these 3 treatments w/ you [place] as before...  Remember to increase the MUCINEX to 1200mg  twice daily w/ fluids (that's 2 of the 600mg  pills twice daily)...  Finally, we will arrange for a new sleep machine to be delivered as we discussed...  Call for any questions...  Let's plan a follow up visit in 6wks, sooner if needed for problems.Marland KitchenMarland Kitchen

## 2017-09-22 NOTE — Progress Notes (Signed)
Subjective:     Patient ID: Andrew [name], male   DOB: Dec 06, 1940, 76 y.o.   MRN: [id number]  HPI  76 y/o WM, an ex-smoker quit in 1986, w/ severe bullous emphysema & hx of RUL bullous resection in 2007; Hx both ?hypercarbic & +hypoxemic resp failure w/ cor pulmonale & secondary pulm HTN on Revatio x yrs; He has been stable for >10 yrs on the same pulm regimen as he has travelled around the [place] living in [place], [place], and [place]...  ~  October 02, 2015:  Initial pulmonary evaluation by SN>  His PCP is Dr. Lujean Amel, [name]...       Andrew [name] is a 76 y/o gentleman from [place]- moved here to [place] ~6mo ago to live w/ his daughter & son-in-law;  He has a long convoluted history & we have none of his prev objective data to review;  He tells me that he has known about COPD/Emphysema for >10 yrs and in 2007 he had right thoracotomy & "bleb-ectomy";  After this procedure he was placed on Oxygen at 2L/min and BiPAP to use at night, along w/ ADVAIR250Bid & SPIRIVA daily, plus REVATIO20Tid for pulmonary HTN;  He was also treated w/ NEBS for about 1-52yrs then this was discontinued;  He has pretty much been on this same regimen for the past 9 yrs w/o much change, despite or maybe because he has moved around a lot- [employer] (surg done there in 2007), to [place], back to [place], on to [place] for 6 yrs, then back to [place] over the last yr or so...  He describes himself as being rock-solid stable on this exact regimen since 2007- he had Cath (?left & right heart) in 2007, told 1 blockage, good LVF ?right heart results, started on O2, BiPAP, and Revatio but he does not know why?  He notes min cough when supine & in early AM attributed to reflux; min if any sput production, no hemoptysis, he denies SOB but states DOE "if I over-exert" eg- walking, lifting/carrying, stairs, etc; he notes that ADLs are ok- no problem (he is stoic);  He denies CP, palpit, f/c/s, edema... He hasn't  been to an ER since 2007 he says & that was also the last time he had any Pred; he thinks he had CXR, PFT, 2DEcho all earlier this yr...   Smoking Hx>  He is an ex-smoker, started in his teens, smoked for 30 yrs up to 1ppd, quit in 1986; This is a 30 pack-yr hx, he does not recall ever being checked for A1AT defic...  Pulmonary Hx>  COPD/ Emphysema w/ right upper lobe "bleb-ectomy) 2007 in [place]; he has chronic hypoxemic resp failure on O2 at 2L/min since 2007;  He tells me that he was started on BiPAP about that same time but he doesn't know why- never had sleep study, not on CPAP prev, he does not know about pCO2 levels etc ("I like the fresh cool air");  His BiPAP came from [facility] in [place]- states he does not know the settings, machine never downloaded, etc;  He has also been on Revatio20Tid since 2007, apparently never tried on other meds, dose never adjusted- he knows about "pulmonary hypertension" but he doesn't know any details and it doesn't appear to have been followed up, and meds kept the same from doctor to doctor...   Medical Hx>  HBP, ?nonobstructive CAD, HL, thyroid nodule, GERD, constipation, BPH, insomnia...  Family Hx>  Father died w/  Emphysema & was a former smoker; no other hx lung dis in the family; Alpha-1 status is unknown...  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   Current Meds>  Oxygen 2L/min pulse-dose concentrator, Advair250Bid, Spiriva daily, Revatio20Tid, CardizemCD240, Crestor10, Nexium20, Proscar5, Restoril30...  EXAM shows Afeb, VSS, O2sat=93% on 2L/min pulse-dose;  Heent- neg, mallampati1;  Chest- decr BS at bases, can't augment BS voluntarily, w/o w/r/r;  Heart- RR w/o m/r/g;  Abd- soft, neg;  Ext- neg w/o c/c/e;  Neuro- intact...  CXR 10/02/15 showed norm heart  size, COPD, bullous emphysema/ hyperinflation, scarring right apex, NAD...   Spirometry 10/02/15 showed FVC=2.70 (69%), FEV1=1.12 (37%), %1sec=41, mid-flows reduced at 18% predicted; this is c/w severe airflow obstruction & GOLD Stage 3 COPD  Ambulatory oxygen saturation test 10/02/15> on O2 at 2L/min pulse-dose concentrator: O2sat=96% on 2L at rest w/ pulse=87; he walked 2 laps w/ his O2, stopped due to dyspnea, lowest O2sat=89% w/ pulse=113/min...  LAB 10/02/15>  Alpha-1-Antitrypsin level => pending (he never went to the lab for this blood test)  2DEchocardiogram 10/09/15 showed norm LVF w/ EF=55-60%, norm wall motion, mild Andrew [name], mild RA dil, PAsys est 15mmHg... Pt on Revatio 20Tid x 27yrs & I rec we wean slowly (Decr to Bid now)...    IMP >>     COPD/ bullous emphysema> s/p RUL "bleb-ectomy" in 2007, severe airflow obstruction w/ GOLD Stage3 COPD> on Advair250Bid & Spiriva daily; apparently he has no use for a rescue inhaler; prev on NEB w/ Albut but not for several yrs.     Chronic hypoxemic respiratory failure on O2 at 2L/min via pulse-dose concentrator...    Pt reports using BiPAP since 2007, never been on CPAP, never had sleep study he says, unknown ABGs or pCO2 data; machine from [place] we will try to get the old data => none received.    Hx pulmonary hypertension on Revatio20Tid since 2007 w/o additional med trials or dose adjustments> we do not have any of the objective data from his prev physician teams... Current 2DEcho w/ PAsys est 48mmHg & we will wean the Revatio to Bid at this point => he does not want to wean further for "other" reasons...    Medical issues include:  HBP, ?nonobstructive CAD, HL, thyroid nodule, GERD, BPH, insomnia... PLAN >>     Andrew [name] has a distinctly patchy history to go along w/ his severe airflow obstruction & bullous emphysema;  We really need his old objective data from 2007 when he was started on O2 & BiPAP after his RUL bleb reduction surg;  He will try to  get names and numbers for [place]- in the meanwhile we will contact his last physician in [facility] for their more recent data as we establish out data base here in [place];  He is very concerned that he wants [place] to continue his current regimen which has served him well over the last 78yrs;  Continue Advair250Bid, Spiriva daily, Revatio20Tid=>Bid, O2 at 2L/min, and the BiPAP nightly as currently set... We plan ROV recheck in 1 month... NOTE> 2DEcho w/ PAsys est ~96mmHg & we will slowly wean Revatio...  ~  November 01, 2015:  59mo ROV & Andrew [name] reports stable, doing satis & notes no untoward effects from cutting the Revatio to 20mg Bid; he denies CP, palpit, incr SOB, edema,  etc; he continues on the O2 at 2L/min, BiPAP from [place], [place] once daily; we have not received any records from his mult physicians ([place], [place], [place])...    EXAM shows Afeb, VSS, O2sat=92% on 2L/min pulse-dose;  Heent- neg, mallampati1;  Chest- decr BS at bases, can't augment BS voluntarily, w/o w/r/r;  Heart- RR w/o m/r/g;  Abd- soft, neg;  Ext- neg w/o c/c/e;  Neuro- intact... IMP/PLAN>>  See prob list above- he is stable on this regimen but does not want to wean the Revatio further for "other" reasons; OK to continue current med regimen- advised regular exercise vs pulm rehab program; given ZPak for prn use over the winter & he knows to call for any resp issues, incr dyspnea, etc... He remains on BiPAP but we do not have any data- no records received from prev pulm physicians, no notes from [place], no download data from his machine- we will again try to make contact w/ his DME company... We plan ROV in 3-54mo.  ~  Mar 18, 2016:  4-70mo ROV & post hosp visit>  Andrew [name] has severe COPD/Emphysema, GOLD Stage 3 w/ FEV1=1.12L (37%predicted) in [id number],  Hx right thoracotomy & "bleb-ectomy" in 2007;  after this procedure he says he was placed on Oxygen at 2L/min and BiPAP to use at night, along w/ ADVAIR250Bid,  SPIRIVA daily, and REVATIO20Tid for pulmonary HTN (2DEcho here 11/16 showed PAsys est=33mmHg)-- we do not have any of that data from [place] (we have been unsuccessful in obtaining old data from any of his prev physicians); he has been resistent to any adjustment in his medication regimen for various reasons...     He's been [place] x2 since last OV> 1st [place] 2/9 - 01/03/16 by [facility] w/ CAP- CXR showed severe bullous emphysema, incr markings in RLL and atelectasis in R-mid lung, Temp 103, Lactate=2.4, WBC 16K; treated w/ O2, Solumedrol=>Pred, Zosyn/Vanco=>Levaquin, NEBs, etc; disch home w/ home health help- ?seen by his PCP after disch...    2nd [facility] 3/26 - 02/15/16 by [facility] w/ 1d hx incr SOB, wheezing, productive cough & felt to have a COPD exac; CXR showed his COPE/E, persistent incr markings in right base, WBC was elev at 20K, BNP=60, no pos cultures; he was treaed w/ O2, Solumedrol, Roceph/Zithro, NEBs, etc; he was disch on Levaquin & Pred; he developed urinary retention & foley placed (weaned off in NH); he was debilitated & sent to NH for rehab...     He was disch to [facility] for rehab & attended by [facility] note dated 03/01/16 reviewed-- finished Levaquin, weaned off the Pred, they were able to discontinue the foley & he passed voiding trial; disch home after 19d in rehab...     Now back home on same O2= 2L/min days & 3L/min night w/ BiPAP ?settings (he has been on this for yrs and never re-assessed), NEB w/ AlbutTid, Advair250Bid, Spiriva daily, Revatio20Bid (pt refuses to taper this med further);  He has developed pedal edema x3d & needs a low sodium diet + diuretic started but he tells me he is sched to see his PCP soon for this problem...    EXAM shows Afeb, VSS, O2sat=95% on 2L/min pulse-dose;  Heent- neg, mallampati1;  Chest- decr BS at bases, can't augment BS voluntarily, w/o w/r/r;  Heart- RR w/o m/r/g;  Abd- soft, neg;  Ext- neg w/o c/c/e;  Neuro- intact...  CXR 02/11/16 showed  hyperinflation, bullous emphysema, some scarring in right mid lung & both lower  lobes, no infiltrate, no edema...  LABS 01/2016> all reviewed in Epic... IMP/PLAN>>  Garmon has had a protracted resp exac- triggered by prob RLL pneumonia (NOS) superimposed on his severe COPD/bullous emphysema;  Rec to change the Albut for Neb to [place] & continue treatments Tid; continue other meds regularly as outlined;  He is referred to [facility] & hopes to start soon;  We will plan ROV in 73mo sooner if needed.  ~  June 20, 2016:  72mo ROV w/ SN>  Andrew [name] returns for a 69mo ROV & states that he is doing very well- no new complaints or concerns at this time, his PCP is DrKoirala; He started [facility] in June & continues in this program at present; Pt prev inquired about treatments at "[facility]" for regenerative lung tissue (stem cell therapy) which costs ~$10K per treatment & all benefits are antecdotal; I offered to refer him to [facility] [place] [facility] if he wants cutting edge research approach...     EPIC records indicate ER visit 05/08/16 for Abd Pain> VSS, exam was neg, CT Abd showed large fecal burden otherw neg, distended urinary bladder, atherosclerosis, bilat L5 pars defects; Rec to take laxatives...     Severe COPD- GOLD Stage3, bullous emphysema, s/p resection of RUL bleb in 2007> on Advair250Bid & Spiriva daily; he has NEB w/ Duoneb for prn use; Spirometry 09/2015 w/ FEV1=1.12 (37%); he is enrolled in [place] & wants a POC instead of tanks;     Chronic hypoxemic respiratory failure on O2 at 2L/min via POC> ambulatory O2sats 09/2015 dropped to 89% on 2L/min after 2 Laps.    Hx prob hypercarbic resp failure inferred from Pt report of BiPAP ([facility]) since 2007, never been on CPAP, never had sleep study, unknown ABGs or pCO2 data>    Hx cor pulmonale/ pulmonary hypertension on Revatio20Tid since 2007 w/o additional med trials or dose adjustments> we do not have any of the objective data from his prev  physician teams; 2DEcho here 09/2015 showed PAsys=36 and we tried to wean his Revatio but he refused due to "other reasons", finally compromised on Revatio20Bid...    Hx CAP- Hosp 12/2015 w/ RLL opac (nos) & responded to broad spectrum Ab coverage + Sulomedrol, O2, Nebs, etc; readmitted 01/2016 w/ COPD exac- similar Rx but sent to NH for rehab at disch...    Cardiac issues>  ?nonobstructive CAD, cor pulmonale w/ mild pulmHTN & 2DEcho 09/2015 showing norm LVF w/ EF=55-60%, norm wall motion, mild Andrew [name], mild RA dil, PAsys est 45mmHg...    Medical issues include:  HBP (on CardizemCD240), HL (on Cres10), hx thyroid nodule, GERD (on Nexium40), Constipation, BPH (on Proscar5 & Flomax0.4) w/ hx urinary retention, insomnia (on Restoril30)... EXAM shows Afeb, VSS, O2sat=93% on 2L/min pulse-dose;  Heent- neg, mallampati1;  Chest- decr BS at bases, can't augment BS voluntarily, w/o w/r/r;  Heart- RR w/o m/r/g;  Abd- soft, neg;  Ext- neg w/o c/c/e;  Neuro- intact...  CXR 05/08/16>  Mod bullous emphysema w/ chr changes at the lung bases w/ pleuroparenchymal scarring, surg suture lines ove the right mid lung...  LABS 04/2016 in epic> Chems- ok, BS=149;  CBC- anemia w/ Hg= 10.4-11.7, WBC=15K IMP/PLAN>>  Andrew [name] is stable on his baseline regimen + pulm rehab, etc; rec to continue current meds and exercise; he needs the 2017 Flu vaccine when avail & will call prn any breathing problems; we plan ROV in 61mo...   ADDENDUM>>  Pt Hosp by [facility] 8/11 -  07/01/16 w/ CAP after presenting w/ incr SOB, decr O2sats at pulm rehab, & chills 9he lives w/ school aged grandkids)- CXR showed ?RLL opac, cultures neg & NOS- treated empirically w/ Rocephin & Zithromax, Solumedrol, O2, NEBS, etc; he improved gradually & disch home;  He went to see ENT 8/29 because he thought the pneumonia may have been from reflux & dysphagia- fiberoptic exam revealed some edema & pooling of secretions c/w LPR=> placed on PPI Bid and referred to GI;  Barium esophagram  showed a [place] & mod GERD but no esophagitis mass or stricture;  He saw DrSchooler & had EGD 08/23/16 showing a [place], patchy candida=> treated w/ Nystatin, later required Diflucan...   ADDENDUM>>  Pt had colonoscopy 08/23/16 by DrSchooler-- small HH, patchy candida, mild gastitis seen;  Treated w/ Nystatin, continue Prilosec40/d...   ~  November 25, 2016:  18mo ROV w/ SN>  Andrew [name] returns c/o 3d hx cough, lots of clear whitish sput, increased weakness and SOB "even on my oxygen"; symptoms started w/ a slight sore throat, hoarse, cough as noted but no f/c/s; he has been going to the Gym 5d per week to continue his exercise since graduating from the formal pulm rehab program (finished 07/2016) & is doing very well by his estimate (until this URI); he notes that 3d ago he had his usual 2H work out, went to [employer] ge[employer], then went to lunch & did all this w/o difficulty...      Severe COPD- GOLD Stage3, bullous emphysema, s/p resection of RUL bleb in 2007> on Advair250Bid & Spiriva daily; he has NEB w/ Duoneb for prn use; Spirometry 09/2015 w/ FEV1=1.12 (37%); he finished his PulmRehab 07/2016 & now exercises 5d/wk at the gym; he finally got his POC that he wanted...    Chronic hypoxemic respiratory failure on O2 at 2L/min via POC> ambulatory O2sats 09/2015 dropped to 89% on 2L/min after 2 Laps; repeat 11/2016 dropped to 85%after 1Lap...    Hx prob hypercarbic resp failure inferred from Pt report of BiPAP ([facility]) since 2007, never been on CPAP, never had sleep study, unknown ABGs or pCO2 data> we never received records from former physician teams or any of his old records...     Hx cor pulmonale/ pulmonary hypertension on Revatio20Tid since 2007 w/o additional med trials or dose adjustments> we do not have any of the objective data from his prev physician teams; 2DEcho here 09/2015 showed PAsys=36 and we tried to wean his Revatio but he refused due to "other reasons", finally compromised on Revatio20Bid...    Hx CAP-  Hosp 12/2015 w/ RLL opac (nos) & responded to broad spectrum Ab coverage + Solumedrol, O2, Nebs, etc; readmitted 01/2016 w/ COPD exac- similar Rx but sent to NH for rehab at disch...    Cardiac issues>  ?nonobstructive CAD, cor pulmonale w/ mild pulmHTN & 2DEcho 09/2015 showing norm LVF w/ EF=55-60%, norm wall motion, mild Andrew [name], mild RA dil, PAsys est 60mmHg...    Medical issues include:  HBP (on CardizemCD240), HL (on Cres10), hx thyroid nodule, GERD & prob LPR (on Nexium40), Constipation, BPH (on Proscar5 & Flomax0.4) w/ hx urinary retention, insomnia (on Restoril30)...       NOTE> pt is Iron deficient (see below) & needs to f/u w/ his PCP & GI for this...  EXAM shows Afeb, VSS, O2sat=92% on 3L/min;  Heent- neg, mallampati1;  Chest- decr BS at bases, can't augment BS voluntarily, w/o w/r/r;  Heart- RR w/o m/r/g;  Abd- soft, neg;  Ext-  neg w/o c/c/e;  Neuro- intact...  CXR 06/28/16>  Hyperinflation, marked emphysematous changes, crowed lung markings at the bases, no consolidation or effusions...  CXR 11/25/16>  COPD, bullous emphysema, and bilat pleuroparenchymal changes c/w scarring, no acute abnormality  Ambulatory O2sat on 3L/min pulse-dose POC>  O2sat=98% on 3L/min pulse-dose at rest w/ HR=90/min;  He ambulated only 1Lap (185') w/ O2sat drop to 85% w/ HR=108/min...  LABS 11/25/16>  Chems- wnl x BS=108;  A1c=6.1;  CBC- Hg=12.3, mcv=90, WBC=7.8K;  Fe=31 (6.6%sat) & Ferritin=8.7;  B12=661 IMP/PLAN>>  There is no such thing as a mild exac for Andrew [name]- even the mildest of URIs can cause a severe COPD exac- we discussed Rx w/ Levaquin500 x7d, Pred20mg - 5d taper (see AVS), + Diflucan100 per his request; hopefully he will respond but he has little in the way of reversible factors given his severe emphysema; reminded to go to the ER for [facility] admission if symptoms worsen despite therapy; as noted he is Fe defic & needs further eval per his PCP & GI- copies sent...  ~  December 23, 2016:  42mo ROV & post hospital  check>  Andrew [name] was [facility] 1/20 - 12/11/16 by Diona Browner w/ a COPD exac w/ acute on chronic resp failure;  Pt already on Home O2 at 2L/min and BiPAP Qhs; he had a root canal done, developed a sore throat, then cough, etc; Levaquin & Pred called -in for pt, Hosp for 4d w/ IV solumedrol & ultimately disch on Pred10... Currently c/o DOE w/ ADLs which is new to him as prev he was going to the gym etc...     He is currently taking Duoneb Q6H as needed + Advair250Bid, Spiriva via handihaler daily, Pred 10mg  tabs- slow taper... EXAM shows Afeb, VSS, O2sat=94% on 2L/min;  Heent- neg, mallampati1;  Chest- decr BS at bases, can't augment BS voluntarily, w/o w/r/r;  Heart- RR w/o m/r/g;  Abd- soft, neg;  Ext- neg w/o c/c/e;  Neuro- intact...  CXR 12/07/16 (independently reviewed by me in the PACS system)> norm heart size, calcif Ao, severe COPD w/ apical bullous changes & surg staples on right, NAD.Marland KitchenMarland Kitchen  EKG 12/07/16 showed NSR, rate 87, poor R prog V1-2, otherw WNL.Marland KitchenMarland Kitchen  LABS 11/2016> NOT retaining CO2 (ABG w/ pCO2=39;  Chems- ok;  CBC- ok w/ Hg=11-12 and wbc=21=>12;  BNP= wnl... IMP/PLAN>>  Andrew [name] had a COPD exac & required IV Solumedrol + in hosp attention w/ NEBS etc & now he is approaching his baseline; he called for an order for a Hosp bed but was referred to his PCP for this determination;  We reviewed the NEED for DUONEB Tid followed by Advair250Bid & Spiriva once daily; we will wean his Pred from 10mg /s to 5mg /d & plan ROV receheck in 6wks...  NOTE: >50% of this 55min rov was spent in counseling & coordination of care...  ~  January 28, 2017:  6wk [place] is stable on meds + Pred 5mg /d;  He called 01/15/17 for antibiotics for an infected tooth- called in Augmentin & tooth has been extracted by dentist;  He remains on NEBs w/ Duoneb Tid, followed by Advair250Bid and Spiriva daily;  He reports using his Revatio "prn";  His breathing continues to improve, back in the gym 3d/wk, approaching his baseline he says;  Denies  cough, sput, hemoptysis, CP, etc... We reviewed his prob list as above...  EXAM shows Afeb, VSS, O2sat=96% on 2L/min;  Heent- neg, mallampati1;  Chest- decr BS at bases, can't augment BS  voluntarily, w/o w/r/r;  Heart- RR w/o m/r/g;  Abd- soft, neg;  Ext- neg w/o c/c/e;  Neuro- intact... IMP/PLAN>>  Andrew [name] continues to improve & move toward his baseline; Rec to continue Pred5mg /d thru March then decr to 5mg  Qod til return visit in 40mo...  ~  May 01, 2017:  69mo ROV & Andrew [name] reports a good 52mo interval w/o acute exac and w/o new complaints or concerns; he is back in the gym 3d/wk & back to baseline he says;  As prev noted Andrew [name] is on BiPAP & has been for yrs- initiated by a pulm physician in [place] yrs ago & I presume this was done for COPD & CO2 retention ant NOT because of sleep apnea; he does not know his settings 7 we have been unable to get old records indicating his initial parameters or follow up monitoring; cureent problem is that his machine is old, mask is 78yr old, no tubing supplies in a long time;  For his part he says he does NOT rest well, just in short intervals;  He goes to bed at 11PM using his BiPAP; wakes at 3-4AM & can't get back to sleep; maybe eats breakfast at 4AM and drifts back to sleep (w/o BiPAP) until 7AM or so; similarly he might nap for 1H during the day (w/o BiPAP); he currently thinks that the BiPAP makes no difference in his sleep & it would just as well suit him to stop it if able...  WE DISCUSSED REASSESSMENT w/ ABG on RA, and a HOME SLEEP TEST on RA to evaluate for sleep apnea and hypercarbic/ hypoxemic resp failure...     [facility] does his Home O2 (2L/min continuous) and [facility] does his nursing care    Andrew [name] also tells me he had a suspicious mole removed from his left post shoulder ~55mo ago; Bx= melanoma, then wider excision by Sherre Lain at the [facility]...     Severe COPD- GOLD Stage3, bullous emphysema, s/p resection of RUL bleb in 2007> on Advair250Bid & Spiriva  daily; he has NEB w/ Duoneb for prn use; he is off PRED now; Spirometry 09/2015 w/ FEV1=1.12 (37%); he finished his PulmRehab 07/2016 & now exercises regularly at the gym...    Chronic hypoxemic respiratory failure on O2 at 2L/min via POC> ambulatory O2sats 09/2015 dropped to 89% on 2L/min after 2 Laps; repeat 11/2016 dropped to 85% after 1Lap...    Hx prob hypercarbic resp failure inferred from Pt report of BiPAP ([facility]) since 2007, never been on CPAP, never had sleep study> we never received records from former physician teams or any of his old records; ABGs in [facility] 12/2015 pH=7.44/ pCO2=35/ pO2=69 ?on RA; 11/2016 pH=7.44/ pCO2=39/ pO2=146 on 4Lnc;   => he has agreed to re-assessment here 2018 w/ ABG on RA and Home Sleep Study => pending...    Hx cor pulmonale/ pulmonary hypertension on Revatio20Tid since 2007 w/o additional med trials or dose adjustments> we do not have any of the objective data from his prev physician teams; 2DEcho here 09/2015 showed PAsys=36 and we tried to wean his Revatio but he refused due to "other reasons", finally compromised on Revatio20Bid...    Hx CAP- Hosp 12/2015 w/ RLL opac (nos) & responded to broad spectrum Ab coverage + Solumedrol, O2, Nebs, etc; readmitted 01/2016 w/ COPD exac- similar Rx but sent to NH for rehab at disch...    Cardiac issues> no cardiologist> ?nonobstructive CAD, cor pulmonale w/ mild pulmHTN & 2DEcho 09/2015 showing norm LVF w/  EF=55-60%, norm wall motion, mild Andrew [name], mild RA dil, PAsys est 23mmHg...    Medical issues> PCP= Dr. Lauretta Grill Koirala ([facility])>  HBP (on CardizemCD240), HL (on Cres10), hx thyroid nodule, GERD & prob LPR (GI= DrSchooler, on Prilosec40), Constipation, BPH (on Proscar5 & Flomax0.4) w/ hx urinary retention, insomnia (on Restoril30)...       NOTE> pt is Iron deficient & needs to f/u w/ his PCP & GI for this>> LABS 11/25/16>  Chems- wnl x BS=108;  A1c=6.1;  CBC- Hg=12.3, mcv=90, WBC=7.8K;  Fe=31 (6.6%sat) & Ferritin=8.7;   B12=661 EXAM shows Afeb, VSS, O2sat=90% on 2L/min pulse dose;  Heent- neg, mallampati1;  Chest- decr BS at bases, can't augment BS voluntarily, w/o w/r/r;  Heart- RR w/o m/r/g;  Abd- soft, neg;  Ext- neg w/o c/c/e;  Neuro- intact...  ABG on RA> pending (not done)  Home Sleep Test> pending (see below) IMP/PLAN>>  Andrew [name] has agreed to proceed w/ home sleep test to r/o OSA and we will further assess w/ ABG on RA to check for CO2 retention and any indication for BiPAP (doubt he will qualify at this time);  He will continue w/ his O2, Advair250, Spiriva, Duoneb vs ProventilHFA which he uses prn;  He does not appear to need the Revatio either but declines to discontinue this medication for other reasons.  ADDENDUM>>  Home Sleep Test done 05/20/17>  4H study showed mild OSA w/ AHI=6.0/hr;  Study done on RA-- lowest O2sat was 74% w/ an ave of 87%;  Pt given the option for in-lab CPAP titration study vs trial CPAP w/ autoset & a mask fit session w/ Lynnae Sandhoff.  He prefers the latter & states he doesn't believe that he has OSA => we will arrange a mask fit session, the order a ResMed S10, air/ auto- set 5/15, w/ heated humidity, cliamate controlled tubing, enroll in airview w/ ROV in 6-8wks after starting for face to face and download... NOTE: he had the mask fit session w/ new FFM & O2 bleed-in at 3L/min; he was not willing to try the CPAP on Auto, says he requires BiPAP & therefore will need an in-lab CPAP/BiPAP titration test...  ~  July 31, 2017:  16mo ROV & Andrew [name] had a Home Sleep Test 05/20/17- as above w/ AHI=6/hr & O2desat to 74% w/ ave 87%;  As noted he has been on BiPAP x yrs originally prescribed by MD in [place]- we have been unable to obtain any old records relating to his prev Dx of OSA & his need for BiPAP;  It is indeed unfortunate that he did not travel w/ his old records when he left [place] for [place], then back to [place], then on to 2 places in [place]; He tells me that he was INTOL to CPAP when tried  in the past;  The prob is that his old machine is wearing out & he is in need of a new machine- he is unable to afford one on his own;  I explained Medicare's rules for CPAP/ BiPAP & how he must follow their edicts about the repeat eval, and eventually the requirement for a download to prove compliance & efficacy-- in essence this means that he MUST have an in lab CPAP/BiPAP titration test, prove INTOL to CPAP to get the BiPAP then determine the BiPAP settings best for him;  This process is followed by a face to face visit in ~77mo for download purposes...    NOTE: his PCP is DrKoirala Programmer, multimedia) & his GI is  DrSchooler ([facility]); Pt saw ENT- Nordbladh,PA at [facility] on 06/24/17> c/o worsening dysphagia & nasal congestion, long hx GERD on PPI therapy, larynx exam showed polypoid changes of the cords, prom cricopharyngeus, and postglottic edema; prev EGD w/ smallHH, mod GERD; repeat laryngoscopy was essent neg- they ordered a Ba swallow, done 06/30/17 & showed sl retention in the valleculae that cleared w/ repeat swallowing, 66mm barium tablet got lodged in the distal esoph & did not pass into the stomach; He again needs attention from his gastroenterologist for dilatation...  EXAM shows Afeb, VSS, O2sat=90% on 2L/min pulse dose;  Heent- neg, mallampati1;  Chest- decr BS at bases, can't augment BS voluntarily, w/o w/r/r;  Heart- RR w/o m/r/g;  Abd- soft, neg;  Ext- neg w/o c/c/e;  Neuro- intact... IMP/PLAN>>  As explained above we will order an in-lab CPAP/BiPAP titration study, and proceed from there...  ADDENDUM>>  Sleep Lab Titration study 10.18.18>  Optimal pressure= 7cmH2O w/ AHI at optimal pressure= 0/hr;  The study lasted 6.5H, no cardiac arrhythmias, no leg movements, he had O2desat lasting longer than 30min despite control in his sleep apnea w/ CPAP and 1L/min O2 was added to his CPAP;  He uses a small [employer] full face mask & heated humidification...  ~  September 22, 2017:  75mo ROV & post-  hosp check>  Andrew [name] was [facility] 10/23 - 09/12/17 by [facility] w/ 4d hx incr cough, green sput, temp to 102 & incr SOB; ER eval revealed right basilar pneumonia, WBC=14.2, & O2sat=87% on 2L/min by [place];  He was treated w/ Zithromax & Rocephin, Solumedrol, Nebs, etc;  Cultures and serologies were all non-revealing & he was disch on Keflex, Pred, Nebs (Xopenex & Ipratripium), etc;  He also had PAF which converted spont & he was seen by [facility] and disch on Eliquis, Cardizem, Metoprolol... His [facility]CP is [facility]ala at [facility]...  We reviewed the following medical problems during today's office visit >>     Severe COPD- GOLD Stage3, bullous emphysema, s/p resection of RUL bleb in 2007> on Advair250Bid & Spiriva daily; he has NEB w/  Xopenex & Atrovent for prn use; he is off PRED now; Spirometry 09/2015 w/ FEV1=1.12 (37%); he finished his PulmRehab 07/2016 & now exercises regularly at the gym => we re-started Pred 20mg  tapering sched down to 1/2 Qod by return visit...    Chronic hypoxemic respiratory failure on O2 at 2L/min via POC> ambulatory O2sats 09/2015 dropped to 89% on 2L/min after 2 Laps; repeat 11/2016 dropped to 85% after 1Lap... He uses O2 at 1L/min Qhs and 2L/min w/ activ.    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    Hx cor pulmonale/ pulmonary hypertension on Revatio20Tid since 2007 w/o additional med trials or dose adjustments> we do not have any of the objective data from his prev physician teams; 2DEcho here 09/2015 showed PAsys=36 and we tried to wean his Revatio but he refused due to "other reasons", finally compromised on Revatio20Bid...    Hx CAP- Hosp 2/17 & 10/18  w/ RLL opac (nos) & responded to broad spectrum Ab coverage + Solumedrol, O2, Nebs, etc; readmitted 01/2016 w/ COPD  exac- similar Rx but sent to NH for rehab at disch...    Cardiac issues> ?nonobstructive CAD, cor pulmonale w/ mild pulmHTN & 2DEcho 09/2015 showing norm LVF w/ EF=55-60%, norm wall motion, mild Andrew [name], mild RA dil, PAsys est 73mmHg...episode PAP 10/18 & converted spont- disch from hosp on Eliquis, Metoprolol, Cardizem & f/u w/ [facility]-DrRandolph pending...    Medical issues> PCP= Dr. Lauretta Grill Koirala ([facility])>  HBP (on CardizemCD240), HL (on Cres10), hx thyroid nodule, GERD & prob LPR (GI= DrSchooler, on Prilosec40), Constipation, BPH (on Proscar5 & Flomax0.4) w/ hx urinary retention, anemia & insomnia (on Restoril30)... EXAM shows Afeb, VSS, O2sat=90% on 2L/min pulse dose;  Heent- neg, mallampati1;  Chest- decr BS at bases, can't augment BS voluntarily, w/o w/r/r;  Heart- RR w/o m/r/g;  Abd- soft, neg;  Ext- neg w/o c/c/e;  Neuro- intact...  CXR 09/09/17>  Norm heart size, interval patchy opac & effusion at right base, underlying COPD...  Mod barium swallow 09/10/17> mild pharyngeal dysphagia due to known esoph issues, mild asp risk, swallowing strategies outlined to pt...  LABS in Epic 08/2017>  [place]- ok BS~130, Cr=0.81, Alb=2.7, sl elev LFTs noted;  CBC- anemic w/ Hg=10.9;  TSH=0.52...   CXR 09/22/17 (independently reviewed by me in the PACS system) showed norm heart size, underlying COPD/emphysema, near complete clearing of the right basilar infiltrate... IMP/PLAN>>  We decided to continue his low dose Pred rx w/ 5mg  Qod;  Continue other meds as prev including NEBs w/ Duoneb Tid followed by Advair250Bid & Spiriva once daily, Mucinex 1-2Bid, Tessalon tid as needed... We plan rov recheck in 6 wks...    Past Medical History:  Diagnosis Date  . BiPAP (biphasic positive airway pressure) dependence    Pt denies history of OSA  . BPH (benign prostatic hyperplasia)   . Cancer ([place])     skin  . COPD (chronic obstructive pulmonary disease) ([place])   . Dysphagia   . Emphysema lung ([place])   . GERD (gastroesophageal reflux disease)    " Silent reflux"  . Hearing loss    right ear  . HLD (hyperlipidemia)   . Hypertension   . Oxygen dependent    2-3 liters  . Oxygen dependent   . Pneumonia   . Pulmonary hypertension ([facility])   Medical Hx>  HBP, ?nonobstructive CAD, HL, thyroid nodule, GERD, constipation, BPH, insomnia... Meds include>  CardizemCD240, Crestor10, Nexium20, Miralax, Proscar10, Restoril30...   Past Surgical History:  Procedure Laterality Date  . CARDIAC CATHETERIZATION    . CATARACT EXTRACTION W/ INTRAOCULAR LENS  IMPLANT, BILATERAL    . LUNG SURGERY    . TONSILLECTOMY    Hx Thoracotomy & RUL Bleb-ectomy 2007 in [place], [place]...   Outpatient Encounter Prescriptions as of 05/01/2017  Medication Sig  . ADVAIR DISKUS 250-50 MCG/DOSE AEPB Inhale 1 puff into the lungs 2 (two) times daily.  Marland Kitchen albuterol (PROVENTIL HFA;VENTOLIN HFA) 108 (90 Base) MCG/ACT inhaler Inhale 2 puffs into the lungs every 6 (six) hours as needed for wheezing or shortness of breath.  . diltiazem (CARTIA XT) 240 MG 24 hr capsule Take 240 mg by mouth at bedtime.  . finasteride (PROSCAR) 5 MG tablet Take 5 mg by mouth at bedtime.   Marland Kitchen ibuprofen (ADVIL,MOTRIN) 200 MG tablet Take 200 mg by mouth every 6 (six) hours as needed for moderate pain.  Marland Kitchen ipratropium-albuterol (DUONEB) 0.5-2.5 (3) MG/3ML SOLN Take 3 mLs by nebulization every 6 (six) hours as needed. Dx: J44.9 (Patient taking differently: Take 3 mLs by nebulization every  6 (six) hours as needed (shortness of breath/ wheezing). Dx: J44.9)  . liver oil-zinc oxide (DESITIN) 40 % ointment Apply 1 application topically See admin instructions. Apply to left butt cheek for wound care approximately every 4 hours while awake  . Multiple Vitamins-Minerals (MULTIVITAMIN WITH MINERALS) tablet Take 1 tablet by mouth daily.  Marland Kitchen omeprazole (PRILOSEC) 40 MG  capsule Take 40 mg by mouth daily.  Marland Kitchen OVER THE COUNTER MEDICATION Apply 1 application topically See admin instructions. Protective ointment from home health care - apply approximately every 4 hours to left butt cheek for wound care  . OXYGEN Inhale 2-3 L into the lungs continuous. 2L daytime, 3L at night  . oxymetazoline (AFRIN) 0.05 % nasal spray Place 1 spray into both nostrils daily as needed for congestion.  . polyethylene glycol (MIRALAX / GLYCOLAX) packet Take 17 g by mouth daily. (Patient taking differently: Take 17 g by mouth at bedtime. Mix in 8 oz liquid and drink)  . rosuvastatin (CRESTOR) 10 MG tablet Take 10 mg by mouth daily.  . sildenafil (REVATIO) 20 MG tablet Take 1 tablet (20 mg total) by mouth 2 (two) times daily.  Marland Kitchen SPIRIVA HANDIHALER 18 MCG inhalation capsule INHALE THE CONTENTS OF 1 CAPSULE EVERY DAY (Patient taking differently: INHALE THE CONTENTS OF 1 CAPSULE EVERY DAY AT NOON)  . tamsulosin (FLOMAX) 0.4 MG CAPS capsule Take 1 capsule (0.4 mg total) by mouth daily after breakfast. (Patient taking differently: Take 0.4 mg by mouth at bedtime. )  . temazepam (RESTORIL) 30 MG capsule Take 30 mg by mouth at bedtime.  . vitamin C (ASCORBIC ACID) 500 MG tablet Take 500 mg by mouth daily.  Marland Kitchen VITAMIN E PO Take 1 capsule by mouth daily.  . predniSONE (DELTASONE) 10 MG tablet OFF now    No Known Allergies   Immunization History  Administered Date(s) Administered  . Influenza, High Dose Seasonal PF 07/22/2016, 08/18/2017  . Influenza-Unspecified 09/19/2015  . PPD Test 02/15/2016  . Pneumococcal Conjugate-13 09/19/2015    Current Medications, Allergies, Past Medical History, Past Surgical History, Family History, and Social History were reviewed in [employer] record.   Review of Systems             All symptoms NEG except where BOLDED >>  Constitutional:  F/C/S, fatigue, anorexia, unexpected weight change. HEENT:  HA, visual changes, hearing loss,  earache, nasal symptoms, sore throat, mouth sores, hoarseness. Resp:  cough, sputum, hemoptysis; SOB, tightness, wheezing. Cardio:  CP, palpit, DOE, orthopnea, edema. GI:  N/V/D/C, blood in stool; reflux, abd pain, distention, gas. GU:  dysuria, freq, urgency, hematuria, flank pain, voiding difficulty. MS:  joint pain, swelling, tenderness, decr ROM; neck pain, back pain, etc. Neuro:  HA, tremors, seizures, dizziness, syncope, weakness, numbness, gait abn. Skin:  suspicious lesions or skin rash. Heme:  adenopathy, bruising, bleeding. Psyche:  confusion, agitation, sleep disturbance, hallucinations, anxiety, depression suicidal.   Objective:   Physical Exam       Vital Signs:  Reviewed...   General:  WD, WN, 76 y/o WM in NAD; alert & oriented; pleasant & cooperative... HEENT:  [place]/AT; Conjunctiva- pink, Sclera- nonicteric, EOM-wnl, PERRLA, EACs-clear, TMs-wnl; NOSE-clear; THROAT-clear & wnl.  Neck:  Supple w/ fair ROM; no JVD; normal carotid impulses w/o bruits; no thyromegaly +small nodule palpated; no lymphadenopathy.  Chest:  Overinflated, resonant percussion note, decr BS bilat & can't augment BS voluntarily, no w/r/r heard... Heart:  Regular Rhythm; norm S1 & S2 without murmurs, rubs, or gallops  detected. Abdomen:  Soft & nontender- no guarding or rebound; normal bowel sounds; no organomegaly or masses palpated. Ext:  decr ROM; without deformities or arthritic changes; no varicose veins, +venous insuffic, no edema;  Pulses intact w/o bruits. Neuro:  No focal neuro deficits; sensory testing normal; gait normal & balance OK. Derm:  No lesions noted; no rash etc. Lymph:  No cervical, supraclavicular, axillary, or inguinal adenopathy palpated.   Assessment:      IMP >>     Severe COPD- GOLD Stage3, bullous emphysema, s/p resection of RUL bleb in 2007> on Advair250Bid & Spiriva daily; he has NEB w/ Duoneb for prn use; Spirometry 09/2015 w/ FEV1=1.12 (37%); he has completed pulm rehab &  now exercises at the gym 5d/wk;  He's had mult COPD exac => treat w/ Levaquin, Pred taper, continue Advair/ spiriva/ NEBS/ O2...    Chronic hypoxemic respiratory failure on O2 at 2L/min via POC> ambulatory O2sats 09/2015 dropped to 89% on 2L/min after 2 Laps; he dropped to 85% after 1Lap on 3L/min POC 11/2016...    Hx prob hypercarbic resp failure inferred from Pt report of BiPAP ([facility]) since 2007, never been on CPAP, never had sleep study, unknown ABGs or pCO2 data> we never received data from his prev physicians.    Hx cor pulmonale/ pulmonary hypertension on Revatio20Tid since 2007 w/o additional med trials or dose adjustments> we do not have any of the objective data from his prev physician teams; 2DEcho here 09/2015 showed PAsys=36 and we tried to wean his Revatio but he refused due to "other reasons", finally compromised on Revatio20Bid...    Hx CAP- Hosp 12/2015 w/ RLL opac (nos) & responded to broad spectrum Ab coverage + Sulomedrol, O2, Nebs, etc; readmitted 01/2016 w/ COPD exac- similar Rx but sent to NH for rehab at disch...    Hx of being on Home BiPAP since 2007 w/ eval & set up in [place]> we have been unable to obtain any old records regarding his initial studies and BiPAP set up...    Cardiac issues>  ?nonobstructive CAD, cor pulmonale w/ mild pulmHTN & 2DEcho 09/2015 showing norm LVF w/ EF=55-60%, norm wall motion, mild Andrew [name], mild RA dil, PAsys est 65mmHg...    Medical issues include:  HBP (on CardizemCD240), HL (on Cres10), hx thyroid nodule, GERD (on Nexium40), constipation, BPH (on Proscar5 & Flomax0.4) w/ hx urinary retention, insomnia (on Restoril30)...       NOTE>  He is Iron deficient & need further eval & Rx from his PCP & GI...  PLAN >>  10/02/15>   Andrew [name] has a distinctly patchy history to go along w/ his severe airflow obstruction & bullous emphysema;  We really need his old objective data from 2007 when he was started on O2 & BiPAP after his RUL bleb reduction surg;  He will  try to get names and numbers for [place]- in the meanwhile we will contact his last physician in [facility] for their more recent data as we establish out data base here in [place];  He is very concerned that he wants [place] to continue his current regimen which has served him well over the last 84yrs;  Continue Advair250Bid, Spiriva daily, Revatio20tid, O2 at 2L/min, and the BiPAP nightly as currently set...  11/01/15>  See prob list above- he is stable on this regimen but does not want to wean the Revatio further for "other" reasons; OK to continue current med regimen- advised regular exercise vs pulm rehab program; given ZPak for  prn use over the winter & he knows to call for any resp issues, incr dyspnea, etc... He remains on BiPAP but we do not have any data- no records received from prev pulm physicians, no notes from [place], no download data from his machine- we will again try to make contact w/ his DME company... 03/18/16>   Andrew [name] has had a protracted resp Andrew [name] x2 this spring- triggered by prob RLL pneumonia (NOS) superimposed on his severe COPD/bullous emphysema;  Rec to change the Albut for Neb to [place] & continue treatments Tid; continue other meds regularly as outlined;  He is referred to [facility] & hopes to start soon;  We will plan ROV in 7mo sooner if needed. 06/20/16>   Andrew [name] is stable on his baseline regimen + pulm rehab, etc; rec to continue current meds and exercise; he needs the 2017 Flu vaccine when avail & will call prn any breathing problems; we plan ROV in 91mo...  11/25/16>   There is no such thing as a mild exac for Andrew [name]- even the mildest of URIs can cause a severe COPD exac- we discussed Rx w/ Levaquin500 x7d, Pred20mg - 5d taper (see AVS), + Diflucan100 per his request; hopefully he will respond but he has little in the way of reversible factors given his severe emphysema; reminded to go to the ER for [facility] admission if symptoms worsen despite therapy; as noted he is Fe defic & needs  further eval per his PCP & GI- copies sent. => subseq Hosp for 4d w/ IV Solumedrol, NEBs/ O2/ etc... 05/01/17>   Andrew [name] has agreed to proceed w/ home sleep test to r/o OSA and we will further assess w/ ABG on RA to check for CO2 retention and any indication for BiPAP (doubt he will qualify at this time);  He will continue w/ his O2, Advair250, Spiriva, Duoneb vs ProventilHFA which he uses prn;  He does not appear to need the Revatio either but declines to discontinue this medication for other reasons; 07/31/17>   As explained above we will order an in-lab CPAP/BiPAP titration study, and proceed from there. 08/2017>   He was Hosp by [facility] w/ RLL pneumonia (nos) and ac on chr hypoxemic resp failure; disch on NEBS, Advair, Spiriva, Pred + Eliquis, Cardizem, Metoprolol for his PAF episode...  09/22/17>   We decided to continue his low dose Pred rx w/ 5mg  Qod;  Continue other meds as prev including NEBs w/ Duoneb Tid followed by Advair250Bid & Spiriva once daily, Mucinex 1-2Bid, Tessalon tid as needed... We plan rov recheck in 6 wks.   Plan:       Medication List        Accurate as of 09/22/17  1:44 PM. Always use your most recent med list.          ADVAIR DISKUS 250-50 MCG/DOSE Aepb Generic drug:  Fluticasone-Salmeterol Inhale 1 puff into the lungs 2 (two) times daily.   albuterol 108 (90 Base) MCG/ACT inhaler Commonly known as:  PROVENTIL HFA;VENTOLIN HFA Inhale 2 puffs into the lungs every 6 (six) hours as needed for wheezing or shortness of breath.   apixaban 5 MG Tabs tablet Commonly known as:  ELIQUIS Take 1 tablet (5 mg total) by mouth 2 (two) times daily.   benzonatate 100 MG capsule Commonly known as:  TESSALON Take 1 capsule (100 mg total) 3 (three) times daily by mouth.   CARTIA XT 240 MG 24 hr capsule Generic drug:  diltiazem   feeding supplement  Liqd Take 1 Container by mouth 3 (three) times daily between meals.   finasteride 5 MG tablet Commonly known as:  PROSCAR     fluconazole 100 MG tablet Commonly known as:  DIFLUCAN Take 2 tablets now then 1 daily until gone   fluticasone 50 MCG/ACT nasal spray Commonly known as:  FLONASE   guaiFENesin 600 MG 12 hr tablet Commonly known as:  MUCINEX Take 1 tablet (600 mg total) by mouth 2 (two) times daily.   ipratropium 0.02 % nebulizer solution Commonly known as:  ATROVENT Take 2.5 mLs (0.5 mg total) by nebulization every 6 (six) hours as needed for wheezing or shortness of breath.   levalbuterol 0.63 MG/3ML nebulizer solution Commonly known as:  XOPENEX Take 3 mLs (0.63 mg total) by nebulization every 6 (six) hours as needed for wheezing or shortness of breath.   metoprolol tartrate 25 MG tablet Commonly known as:  LOPRESSOR Take 1 tablet (25 mg total) by mouth 2 (two) times daily.   multivitamin with minerals tablet   omeprazole 40 MG capsule Commonly known as:  PRILOSEC   OVER THE COUNTER MEDICATION   OXYGEN   polyethylene glycol packet Commonly known as:  MIRALAX / GLYCOLAX Take 17 g by mouth daily.   predniSONE 20 MG tablet Commonly known as:  DELTASONE Take as directed   rosuvastatin 10 MG tablet Commonly known as:  CRESTOR   sildenafil 20 MG tablet Commonly known as:  REVATIO Take 1 tablet (20 mg total) by mouth 2 (two) times daily.   SPIRIVA HANDIHALER 18 MCG inhalation capsule Generic drug:  tiotropium INHALE THE CONTENTS OF 1 CAPSULE EVERY DAY   tamsulosin 0.4 MG Caps capsule Commonly known as:  FLOMAX Take 1 capsule (0.4 mg total) by mouth daily after breakfast.   temazepam 30 MG capsule Commonly known as:  RESTORIL Take 1 capsule (30 mg total) at bedtime by mouth.   vitamin C 500 MG tablet Commonly known as:  ASCORBIC ACID       Where to Get Your Medications    These medications were sent to [facility], [place] - [address] [place] [address]  [address] [place] [address]   Phone:  [phone]    benzonatate 100 MG capsule  fluconazole 100 MG tablet  predniSONE 20 MG tablet  temazepam 30 MG capsule

## 2017-09-22 NOTE — Patient Instructions (Signed)

## 2017-10-06 ENCOUNTER — Telehealth: Payer: Self-pay | Admitting: Pulmonary Disease

## 2017-10-06 NOTE — Telephone Encounter (Signed)
Received forms from folder and placed on SN cart.

## 2017-10-08 NOTE — Telephone Encounter (Signed)
Spoke with SN- he is aware of forms on his desk and will complete them and return to nursing staff.

## 2017-10-14 ENCOUNTER — Other Ambulatory Visit: Payer: Self-pay | Admitting: *Deleted

## 2017-10-14 MED ORDER — APIXABAN 5 MG PO TABS: 5.0000 mg | ORAL_TABLET | Freq: Two times a day (BID) | ORAL | 0 refills | Status: DC

## 2017-10-14 NOTE — Telephone Encounter (Signed)
Patient stated that he is out of medication and would like for this to be sent in today if possible.

## 2017-10-14 NOTE — Telephone Encounter (Signed)
Ok we will leave up front for pt. Nothing further is needed.

## 2017-10-14 NOTE — Telephone Encounter (Signed)
Forms have been completed by SN and I have called the pt to see if he wants these forms faxed in by [place] or does he want to come and pick these up.

## 2017-10-14 NOTE — Telephone Encounter (Signed)
Patient returning call - he states he would like to pick up these forms this afternoon - if we have questions - he can be reached at [phone] -pr

## 2017-11-03 ENCOUNTER — Ambulatory Visit: Payer: Medicare Other | Admitting: Pulmonary Disease

## 2017-11-04 ENCOUNTER — Encounter: Payer: Self-pay | Admitting: Pulmonary Disease

## 2017-11-04 ENCOUNTER — Ambulatory Visit (INDEPENDENT_AMBULATORY_CARE_PROVIDER_SITE_OTHER): Payer: Medicare Other | Admitting: Pulmonary Disease

## 2017-11-04 VITALS — BP 110/52 | HR 51 | Temp 98.0°F | Ht 67.0 in | Wt 140.4 lb

## 2017-11-04 DIAGNOSIS — Z9989 Dependence on other enabling machines and devices: Secondary | ICD-10-CM | POA: Diagnosis not present

## 2017-11-04 DIAGNOSIS — J432 Centrilobular emphysema: Secondary | ICD-10-CM | POA: Diagnosis not present

## 2017-11-04 DIAGNOSIS — J9611 Chronic respiratory failure with hypoxia: Secondary | ICD-10-CM | POA: Diagnosis not present

## 2017-11-04 DIAGNOSIS — I272 Pulmonary hypertension, unspecified: Secondary | ICD-10-CM | POA: Diagnosis not present

## 2017-11-04 DIAGNOSIS — I10 Essential (primary) hypertension: Secondary | ICD-10-CM | POA: Diagnosis not present

## 2017-11-04 DIAGNOSIS — G4733 Obstructive sleep apnea (adult) (pediatric): Secondary | ICD-10-CM | POA: Diagnosis not present

## 2017-11-04 DIAGNOSIS — I480 Paroxysmal atrial fibrillation: Secondary | ICD-10-CM | POA: Diagnosis not present

## 2017-11-04 MED ORDER — IPRATROPIUM-ALBUTEROL 0.5-2.5 (3) MG/3ML IN SOLN: 3.0000 mL | Freq: Three times a day (TID) | RESPIRATORY_TRACT | 2 refills | Status: DC

## 2017-11-04 MED ORDER — PREDNISONE 5 MG PO TABS: ORAL_TABLET | ORAL | 5 refills | Status: DC

## 2017-11-04 NOTE — Patient Instructions (Signed)
Today we updated your med list in our EPIC system...    Continue your current medications the same...  We discussed continuing the Pred20- 1/2 tab every other day til they are all gone...    Then switch to the new Pred 5mg  tabs- one tab every other day thereafter...  We discussed your exercise program-- keep up the good work 7 monitor your BP & heart rates...    Call if BP is consistently below 110, so we can consider an adjustment in your meds...  Keep up the good work Andrew [name]!!!  Call for any questions...  Let's plan a follow up visit in 81mo, sooner if needed for problems.Marland KitchenMarland Kitchen

## 2017-11-19 ENCOUNTER — Other Ambulatory Visit: Payer: Self-pay

## 2017-11-19 MED ORDER — APIXABAN 5 MG PO TABS: 5.0000 mg | ORAL_TABLET | Freq: Two times a day (BID) | ORAL | 10 refills | Status: DC

## 2017-11-27 ENCOUNTER — Telehealth: Payer: Self-pay | Admitting: Pulmonary Disease

## 2017-11-27 NOTE — Telephone Encounter (Signed)
[employer] form signed and faxed back. Nothing further needed.

## 2017-11-27 NOTE — Telephone Encounter (Signed)
Will forward to [place]

## 2017-11-28 ENCOUNTER — Telehealth: Payer: Self-pay | Admitting: Pulmonary Disease

## 2017-11-28 NOTE — Telephone Encounter (Signed)
Original copy of [employer] form given to patient at his request.  Nothing further needed.

## 2017-12-12 ENCOUNTER — Telehealth: Payer: Self-pay | Admitting: Pulmonary Disease

## 2017-12-12 NOTE — Telephone Encounter (Signed)
Spoke with patient. He stated that he was advised to call back to give SN an update on his status. He has been taking the prednisone 5mg  every other day and his systolic pressure has been above 110 for the past 6 days.   He also stated that his breathing has gotten worse. He has increased SOB with exacerbation.   He wishes to use [facility] in [place].   SN, please advise. Thanks!

## 2017-12-15 ENCOUNTER — Encounter: Payer: Self-pay | Admitting: Pulmonary Disease

## 2017-12-15 NOTE — Telephone Encounter (Signed)
Patient returned call. Scheduled OV with Dr. Lenna Gilford for tomorrow, 12/16/17 at 11:30 am.

## 2017-12-15 NOTE — Telephone Encounter (Signed)
Will have pt come in for the ov tomorrow and go from there with recs per SN. Encounter going to be closed.

## 2017-12-15 NOTE — Telephone Encounter (Signed)
NEEDS APPT!!! LMTCB

## 2017-12-15 NOTE — Telephone Encounter (Signed)
Pt is calling back with a update. He is still very SOB with exertion. Has a cough with yellow mucus. Cb is [phone].

## 2017-12-16 ENCOUNTER — Encounter: Payer: Self-pay | Admitting: Pulmonary Disease

## 2017-12-16 ENCOUNTER — Telehealth: Payer: Self-pay | Admitting: Pulmonary Disease

## 2017-12-16 ENCOUNTER — Ambulatory Visit (INDEPENDENT_AMBULATORY_CARE_PROVIDER_SITE_OTHER): Payer: Medicare Other | Admitting: Pulmonary Disease

## 2017-12-16 VITALS — BP 116/60 | HR 59 | Temp 98.0°F | Ht 67.0 in | Wt 142.2 lb

## 2017-12-16 DIAGNOSIS — Z8701 Personal history of pneumonia (recurrent): Secondary | ICD-10-CM | POA: Diagnosis not present

## 2017-12-16 DIAGNOSIS — J432 Centrilobular emphysema: Secondary | ICD-10-CM | POA: Diagnosis not present

## 2017-12-16 DIAGNOSIS — I480 Paroxysmal atrial fibrillation: Secondary | ICD-10-CM | POA: Diagnosis not present

## 2017-12-16 DIAGNOSIS — I272 Pulmonary hypertension, unspecified: Secondary | ICD-10-CM | POA: Diagnosis not present

## 2017-12-16 DIAGNOSIS — J9611 Chronic respiratory failure with hypoxia: Secondary | ICD-10-CM

## 2017-12-16 DIAGNOSIS — Z9989 Dependence on other enabling machines and devices: Secondary | ICD-10-CM

## 2017-12-16 DIAGNOSIS — G4733 Obstructive sleep apnea (adult) (pediatric): Secondary | ICD-10-CM | POA: Diagnosis not present

## 2017-12-16 DIAGNOSIS — I10 Essential (primary) hypertension: Secondary | ICD-10-CM

## 2017-12-16 MED ORDER — PREDNISONE 5 MG PO TABS: ORAL_TABLET | ORAL | 5 refills | Status: DC

## 2017-12-16 MED ORDER — DOXYCYCLINE HYCLATE 100 MG PO TABS: 100.0000 mg | ORAL_TABLET | Freq: Two times a day (BID) | ORAL | 0 refills | Status: DC

## 2017-12-16 NOTE — Progress Notes (Signed)
Subjective:     Patient ID: Andrew [name], male   DOB: 1941/08/10, 77 y.o.   MRN: [id number]  HPI 77 y/o WM, an ex-smoker quit in 1986, w/ severe bullous emphysema & hx of RUL bullous resection in 2007; Hx both ?hypercarbic & +hypoxemic resp failure w/ cor pulmonale & secondary pulm HTN on Revatio x yrs; He has been stable for >10 yrs on the same pulm regimen as he has travelled around the [place] living in [place], [place], and [place]...  ~  October 02, 2015:  Initial pulmonary evaluation by SN>  His PCP is Dr. Lujean Amel, [name]...       Poss is a 77 y/o gentleman from [place]- moved here to [place]moago to live w/ his daughter & son-in-law;  He has a long convoluted history & we have none of his prev objective data to review;  He tells me that he has known about COPD/Emphysema for >10 yrs and in 2007 he had right thoracotomy & "bleb-ectomy";  After this procedure he was placed on Oxygen at 2L/min and BiPAP to use at night, along w/ ADVAIR250Bid & SPIRIVA daily, plus REVATIO20Tid for pulmonary HTN;  He was also treated w/ NEBS for about 1-245yrthen this was discontinued;  He has pretty much been on this same regimen for the past 9 yrs w/o much change, despite or maybe because he has moved around a lot- [name], [name]...  He describes himself as being rock-solid stable on this exact regimen since 2007- he had Cath (?left & right heart) in 2007, told 1 blockage, good LVF ?right heart results, started on O2, BiPAP, and Revatio but he does not know why?  He notes min cough when supine & in early AM attributed to reflux; min if any sput production, no hemoptysis, he denies SOB but states DOE "if I over-exert" eg- walking, lifting/carrying, stairs, etc; he notes that ADLs are ok- no problem (he is stoic);  He denies CP, palpit, f/c/s, edema... He hasn't been  to an ER since 2007 he says & that was also the last time he had any Pred; he thinks he had CXR, PFT, 2DEcho all earlier this yr...   Smoking Hx>  He is an ex-smoker, started in his teens, smoked for 30 yrs up to 1ppd, quit in 1986; This is a 30 pack-yr hx, he does not recall ever being checked for A1AT defic...  Pulmonary Hx>  COPD/ Emphysema w/ right upper lobe "bleb[place]) [place] hypoxemic resp failure on O2 at 2L/min since 2007;  He tells me that he was started on BiPAP about that same time but he doesn't know why- never had sleep study, not on CPAP prev, he does not know about pCO2 levels etc ("I like the fresh cool air");  [name] he does not know the settings, machine never downloaded, etc;  He has also been on Revatio20Tid since 2007, apparently never tried on other meds, dose never adjusted- he knows about "pulmonary hypertension" but he doesn't know any details and it doesn't appear to have been followed up, and meds kept the same from doctor to doctor...   Medical Hx>  HBP, ?nonobstructive CAD, HL, thyroid nodule, GERD, constipation, BPH, insomnia...  Family Hx>  Father died w/ Emphysema &  was a former smoker; no other hx lung dis in the family; Alpha-1 status is unknown...  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   Current Meds>  Oxygen 2L/min pulse-dose concentrator, Advair250Bid, Spiriva daily, Revatio20Tid, CardizemCD240, Crestor10, Nexium20, Proscar5, Restoril30...  EXAM shows Afeb, VSS, O2sat=93% on 2L/min pulse-dose;  Heent- neg, mallampati1;  Chest- decr BS at bases, can't augment BS voluntarily, w/o w/r/r;  Heart- RR w/o m/r/g;  Abd- soft, neg;  Ext- neg w/o c/c/e;  Neuro- intact...  CXR 10/02/15 showed norm heart size,  COPD, bullous emphysema/ hyperinflation, scarring right apex, NAD...   Spirometry 10/02/15 showed FVC=2.70 (69%), FEV1=1.12 (37%), %1sec=41, mid-flows reduced at 18% predicted; this is c/w severe airflow obstruction & GOLD Stage 3 COPD  Ambulatory oxygen saturation test 10/02/15> on O2 at 2L/min pulse-dose concentrator: O2sat=96% on 2L at rest w/ pulse=87; he walked 2 laps w/ his O2, stopped due to dyspnea, lowest O2sat=89% w/ pulse=113/min...  LAB 10/02/15>  Alpha-1-Antitrypsin level => pending (he never went to the lab for this blood test)  2DEchocardiogram 10/09/15 showed norm LVF w/ EF=55-60%, norm wall motion, mild MR, mild RA dil, PAsys est 66mHg... Pt on Revatio 20Tid x 934yr& I rec we wean slowly (Decr to Bid now)...    IMP >>     COPD/ bullous emphysema> s/p RUL "bleb-ectomy" in 2007, severe airflow obstruction w/ GOLD Stage3 COPD> on Advair250Bid & Spiriva daily; apparently he has no use for a rescue inhaler; prev on NEB w/ Albut but not for several yrs.     Chronic hypoxemic respiratory failure on O2 at 2L/min via pulse-dose concentrator...    Pt reports using BiPAP since 2007, never been on CPAP, never had sleep study he says, unknown ABGs or pCO2 data; machine from [name] will try to get the old data => none received.    Hx pulmonary hypertension on Revatio20Tid since 2007 w/o additional med trials or dose adjustments> we do not have any of the objective data from his prev physician teams... Current 2DEcho w/ PAsys est 3626m & we will wean the Revatio to Bid at this point => he does not want to wean further for "other" reasons...    Medical issues include:  HBP, ?nonobstructive CAD, HL, thyroid nodule, GERD, BPH, insomnia... PLAN >>     MarOmids a distinctly patchy history to go along w/ his severe airflow obstruction & bullous emphysema;  We really need his old objective data from 2007 when he was started on O2 & BiPAP after his RUL bleb reduction surg;  He will try to get  names and numbers for us-[place] the meanwhile we will [place] [facility] [place] more recent data as we establish out [place] that he wants [place] continue his current regimen which has served him well over the last 70yr270yrContinue Advair250Bid, Spiriva daily, Revatio20Tid=>Bid, O2 at 2L/min, and the BiPAP nightly as currently set... We plan ROV recheck in 1 month... NOTE> 2DEcho w/ PAsys est ~36mm40m we will slowly wean Revatio...  ~  November 01, 2015:  20mo R64mo Andrew [name] reports stable, doing satis & notes no untoward effects from cutting the Revatio to 20mgBi49me denies CP, palpit, incr SOB, edema, etc; he  continues on the O2 at 2L/min, BiPAP from [place] once daily; we have not received any records from his mult physicians ([place], [place], [place])...    EXAM shows Afeb, VSS, O2sat=92% on 2L/min pulse-dose;  Heent- neg, mallampati1;  Chest- decr BS at bases, can't augment BS voluntarily, w/o w/r/r;  Heart- RR w/o m/r/g;  Abd- soft, neg;  Ext- neg w/o c/c/e;  Neuro- intact... IMP/PLAN>>  See prob list above- he is stable on this regimen but does not want to wean the Revatio further for "other" reasons; OK to continue current med regimen- advised regular exercise vs pulm rehab program; given ZPak for prn use over the winter & he knows to call for any resp issues, incr dyspnea, etc... He remains on BiPAP but we do not have any data- no records received from prev pulm physicians, no notes from [place], no download data from his machine- we will again try to make contact w/ his DME company... We plan ROV in 3-67mo  ~  Mar 18, 2016:  4-542moOV & post hosp visit>  [place] severe COPD/Emphysema, GOLD Stage 3 w/ FEV1=1.12L (37%predicted) [id number] Hx right thoracotomy & "bleb-ectomy" in 2007;  after this procedure he says he was placed on Oxygen at 2L/min and BiPAP to use at night, along w/ ADVAIR250Bid, SPIRIVA  daily, and REVATIO20Tid for pulmonary HTN (2DEcho here 11/16 showed PAsys est=3643m)-- we do not have any of that data from [place] have been unsuccessful in obtaining old data from any of his prev physicians); he has been resistent to any adjustment in his medication regimen for various reasons...     He's been [facility]> [facility] by [facility] w/ CAP- CXR showed severe bullous emphysema, incr markings in RLL and atelectasis in R-mid lung, Temp 103, Lactate=2.4, WBC 16K; treated w/ O2, Solumedrol=>Pred, Zosyn/Vanco=>Levaquin, NEBs, etc; disch home w/ home health help- ?seen by his PCP after disch...    [facility] w/ 1d hx incr SOB, wheezing, productive cough & felt to have a COPD exac; CXR showed his COPE/E, persistent incr markings in right base, WBC was elev at 20K, BNP=60, no pos cultures; he was treaed w/ O2, Solumedrol, Roceph/Zithro, NEBs, etc; he was disch on Levaquin & Pred; he developed urinary retention & foley placed (weaned off in NH); he was debilitated & sent to NH for rehab...     [facility] & attended by PieSunGardte dated 03/01/16 reviewed-- finished Levaquin, weaned off the Pred, they were able to discontinue the foley & he passed voiding trial; disch home after 19d in rehab...     Now back home on same O2= 2L/min days & 3L/min night w/ BiPAP ?settings (he has been on this for yrs and never re-assessed), NEB w/ AlbutTid, Advair250Bid, Spiriva daily, Revatio20Bid (pt refuses to taper this med further);  He has developed pedal edema x3d & needs a low sodium diet + diuretic started but he tells me he is sched to see his PCP soon for this problem...    EXAM shows Afeb, VSS, O2sat=95% on 2L/min pulse-dose;  Heent- neg, mallampati1;  Chest- decr BS at bases, can't augment BS voluntarily, w/o w/r/r;  Heart- RR w/o m/r/g;  Abd- soft, neg;  Ext- neg w/o c/c/e;  Neuro- intact...  CXR 02/11/16 showed  hyperinflation, bullous emphysema, some scarring in right mid lung & both lower lobes, no  infiltrate, no edema...  LABS 01/2016> all reviewed in Epic... IMP/PLAN>>  Andrew [name] has had a protracted resp exac- triggered by prob RLL pneumonia (NOS) superimposed on his severe COPD/bullous emphysema;  Rec to change the Albut for Neb to [place] & continue treatments Tid; continue other meds regularly as outlined;  He is referred to [facility] & hopes to start soon;  We will plan ROV in 38mosooner if needed.  ~  June 20, 2016:  365moOV w/ SN>  Andrew [name] returns for a 46m47moV & states that he is doing very well- no new complaints or concerns at this time, his PCP is DrKoirala; [facility] & continues in this program at present; Pt prev [facility] about treatments at [facility] [place] regenerative lung tissue (stem cell therapy) which costs ~$10K per treatment & all benefits are antecdotal; I [facility] if he wants cutting edge research approach...     EPIC records indicate ER visit 05/08/16 for Abd Pain> VSS, exam was neg, CT Abd showed large fecal burden otherw neg, distended urinary bladder, atherosclerosis, bilat L5 pars defects; Rec to take laxatives...     Severe COPD- GOLD Stage3, bullous emphysema, s/p resection of RUL bleb in 2007> on Advair250Bid & Spiriva daily; he has NEB w/ Duoneb for prn use; Spirometry 09/2015 w/ FEV1=1.12 (37%); he is enrolled in PulOhiowants a POC instead of tanks;     Chronic hypoxemic respiratory failure on O2 at 2L/min via POC> ambulatory O2sats 09/2015 dropped to 89% on 2L/min after 2 Laps.    Hx prob hypercarbic resp failure inferred from Pt report of BiPAP ([facility]) since 2007, never been on CPAP, never had sleep study, unknown ABGs or pCO2 data>    Hx cor pulmonale/ pulmonary hypertension on Revatio20Tid since 2007 w/o additional med trials or dose adjustments> we do not have any of the objective data from his prev  physician teams; 2DEcho here 09/2015 showed PAsys=36 and we tried to wean his Revatio but he refused due to "other reasons", finally compromised on Revatio20Bid...    Hx CAP- Hosp 12/2015 w/ RLL opac (nos) & responded to broad spectrum Ab coverage + Sulomedrol, O2, Nebs, etc; readmitted 01/2016 w/ COPD exac- similar Rx but sent to NH for rehab at disch...    Cardiac issues>  ?nonobstructive CAD, cor pulmonale w/ mild pulmHTN & 2DEcho 09/2015 showing norm LVF w/ EF=55-60%, norm wall motion, mild MR, mild RA dil, PAsys est 75m11m..    Medical issues include:  HBP (on CardizemCD240), HL (on Cres10), hx thyroid nodule, GERD (on Nexium40), Constipation, BPH (on Proscar5 & Flomax0.4) w/ hx urinary retention, insomnia (on Restoril30)... EXAM shows Afeb, VSS, O2sat=93% on 2L/min pulse-dose;  Heent- neg, mallampati1;  Chest- decr BS at bases, can't augment BS voluntarily, w/o w/r/r;  Heart- RR w/o m/r/g;  Abd- soft, neg;  Ext- neg w/o c/c/e;  Neuro- intact...  CXR 05/08/16>  Mod bullous emphysema w/ chr changes at the lung bases w/ pleuroparenchymal scarring, surg suture lines ove the right mid lung...  LABS 04/2016 in epic> Chems- ok, BS=149;  CBC- anemia w/ Hg= 10.4-11.7, WBC=15K IMP/PLAN>>  Andrew [name] is stable on his baseline regimen + pulm rehab, etc; rec to continue current meds and exercise; he needs the 2017 Flu vaccine when avail & will call prn any breathing problems; we plan ROV in 5mo.145mo ADDENDUM>>  Pt Hosp by [facility] 8/11 - 07/01/16 w/  CAP after presenting w/ incr SOB, decr O2sats at pulm rehab, & chills 9he lives w/ school aged grandkids)- CXR showed ?RLL opac, cultures neg & NOS- treated empirically w/ Rocephin & Zithromax, Solumedrol, O2, NEBS, etc; he improved gradually & disch home;  He went to see ENT 8/29 because he thought the pneumonia may have been from reflux & dysphagia- fiberoptic exam revealed some edema & pooling of secretions c/w LPR=> placed on PPI Bid and referred to GI;  Barium esophagram  showed a [place] & mod GERD but no esophagitis mass or stricture;  He saw DrSchooler & had EGD 08/23/16 showing a Zebulon, patchy candida=> treated w/ Nystatin, later required Diflucan...   ADDENDUM>>  Pt had colonoscopy 08/23/16 by DrSchooler-- small HH, patchy candida, mild gastitis seen;  Treated w/ Nystatin, continue Prilosec40/d...   ~  November 25, 2016:  78moROV w/ SN>  Andrew [name] returns c/o 3d hx cough, lots of clear whitish sput, increased weakness and SOB "even on my oxygen"; symptoms started w/ a slight sore throat, hoarse, cough as noted but no f/c/s; he has been going to the Gym 5d per week to continue his exercise since graduating from the formal pulm rehab program (finished 07/2016) & is doing very well by his estimate (until this URI); he notes that 3d ago he had his usual 2H work out, went to tWellsite geologist then went to lunch & did all this w/o difficulty...      Severe COPD- GOLD Stage3, bullous emphysema, s/p resection of RUL bleb in 2007> on Advair250Bid & Spiriva daily; he has NEB w/ Duoneb for prn use; Spirometry 09/2015 w/ FEV1=1.12 (37%); he finished his PulmRehab 07/2016 & now exercises 5d/wk at the gym; he finally got his POC that he wanted...    Chronic hypoxemic respiratory failure on O2 at 2L/min via POC> ambulatory O2sats 09/2015 dropped to 89% on 2L/min after 2 Laps; repeat 11/2016 dropped to 85%after 1Lap...    Hx prob hypercarbic resp failure inferred from Pt report of BiPAP ([facility]) since 2007, never been on CPAP, never had sleep study, unknown ABGs or pCO2 data> we never received records from former physician teams or any of his old records...     Hx cor pulmonale/ pulmonary hypertension on Revatio20Tid since 2007 w/o additional med trials or dose adjustments> we do not have any of the objective data from his prev physician teams; 2DEcho here 09/2015 showed PAsys=36 and we tried to wean his Revatio but he refused due to "other reasons", finally compromised on Revatio20Bid...    Hx CAP-  Hosp 12/2015 w/ RLL opac (nos) & responded to broad spectrum Ab coverage + Solumedrol, O2, Nebs, etc; readmitted 01/2016 w/ COPD exac- similar Rx but sent to NH for rehab at disch...    Cardiac issues>  ?nonobstructive CAD, cor pulmonale w/ mild pulmHTN & 2DEcho 09/2015 showing norm LVF w/ EF=55-60%, norm wall motion, mild MR, mild RA dil, PAsys est 372mg...    Medical issues include:  HBP (on CardizemCD240), HL (on Cres10), hx thyroid nodule, GERD & prob LPR (on Nexium40), Constipation, BPH (on Proscar5 & Flomax0.4) w/ hx urinary retention, insomnia (on Restoril30)...       NOTE> pt is Iron deficient (see below) & needs to f/u w/ his PCP & GI for this...  EXAM shows Afeb, VSS, O2sat=92% on 3L/min;  Heent- neg, mallampati1;  Chest- decr BS at bases, can't augment BS voluntarily, w/o w/r/r;  Heart- RR w/o m/r/g;  Abd- soft, neg;  Ext- neg w/o  c/c/e;  Neuro- intact...  CXR 06/28/16>  Hyperinflation, marked emphysematous changes, crowed lung markings at the bases, no consolidation or effusions...  CXR 11/25/16>  COPD, bullous emphysema, and bilat pleuroparenchymal changes c/w scarring, no acute abnormality  Ambulatory O2sat on 3L/min pulse-dose POC>  O2sat=98% on 3L/min pulse-dose at rest w/ HR=90/min;  He ambulated only 1Lap (185') w/ O2sat drop to 85% w/ HR=108/min...  LABS 11/25/16>  Chems- wnl x BS=108;  A1c=6.1;  CBC- Hg=12.3, mcv=90, WBC=7.8K;  Fe=31 (6.6%sat) & Ferritin=8.7;  B12=661 IMP/PLAN>>  There is no such thing as a mild exac for Andrew [name]- even the mildest of URIs can cause a severe COPD exac- we discussed Rx w/ Levaquin500 x7d, Pred80m- 5d taper (see AVS), + Diflucan100 per his request; hopefully he will respond but he has little in the way of reversible factors given his severe emphysema; [facility] symptoms worsen despite therapy; as noted he is Fe defic & needs further eval per his PCP & GI- copies sent...  ~  December 23, 2016:  163moOV & post hospital  check>  [name] by TrDiona Browner/ a COPD exac w/ acute on chronic resp failure;  Pt already on Home O2 at 2L/min and BiPAP Qhs; he had a root canal done, developed a sore throat, then cough, etc; Levaquin & Pred called -in for pt, Hosp for 4d w/ IV solumedrol & ultimately disch on Pred10... Currently c/o DOE w/ ADLs which is new to him as prev he was going to the gym etc...     He is currently taking Duoneb Q6H as needed + Advair250Bid, Spiriva via handihaler daily, Pred 1060mabs- slow taper... EXAM shows Afeb, VSS, O2sat=94% on 2L/min;  Heent- neg, mallampati1;  Chest- decr BS at bases, can't augment BS voluntarily, w/o w/r/r;  Heart- RR w/o m/r/g;  Abd- soft, neg;  Ext- neg w/o c/c/e;  Neuro- intact...  CXR 12/07/16 (independently reviewed by me in the PACS system)> norm heart size, calcif Ao, severe COPD w/ apical bullous changes & surg staples on right, NAD... Marland KitchenMarland KitchenKG 12/07/16 showed NSR, rate 87, poor R prog V1-2, otherw WNL... Marland KitchenMarland KitchenABS 11/2016> NOT retaining CO2 (ABG w/ pCO2=39;  Chems- ok;  CBC- ok w/ Hg=11-12 and wbc=21=>12;  BNP= wnl... IMP/PLAN>>  Andrew [name] had a COPD exac & required IV Solumedrol + in hosp attention w/ NEBS etc & now he is approaching his baseline; he called for an order for a Hosp bed but was referred to his PCP for this determination;  We reviewed the NEED for DUONEB Tid followed by Advair250Bid & Spiriva once daily; we will wean his Pred from 72m79mto 5mg/5m plan ROV receheck in 6wks...  NOTE: >50% of this 25min73m was spent in counseling & coordination of care...  ~  January 28, 2017:  6wk ROV & Yadkinable on meds + Pred 5mg/d;38me called 01/15/17 for antibiotics for an infected tooth- called in Augmentin & tooth has been extracted by dentist;  He remains on NEBs w/ Duoneb Tid, followed by Advair250Bid and Spiriva daily;  He reports using his Revatio "prn";  His breathing continues to improve, back in the gym 3d/wk, approaching his baseline he says;  Denies  cough, sput, hemoptysis, CP, etc... We reviewed his prob list as above...  EXAM shows Afeb, VSS, O2sat=96% on 2L/min;  Heent- neg, mallampati1;  Chest- decr BS at bases, can't augment BS voluntarily, w/o  w/r/r;  Heart- RR w/o m/r/g;  Abd- soft, neg;  Ext- neg w/o c/c/e;  Neuro- intact... IMP/PLAN>>  Andrew [name] continues to improve & move toward his baseline; Rec to continue Pred27m/d thru March then decr to 549mQod til return visit in 41m18mo  ~  May 01, 2017:  448mo7448mo & Andrew [name] reports a good 448mo 57morval w/o acute exac and w/o new complaints or concerns; he is back in the gym 3d/wk & back to baseline he says;  As prev noted Andrew [name] is on BiPAP & has been for yrs- [place] & I presume this was done for COPD & CO2 retention ant NOT because of sleep apnea; he does not know his settings 7 we have been unable to get old records indicating his initial parameters or follow up monitoring; cureent problem is that his machine is old, mask is 53yr o12yrno tubing supplies in a long time;  For his part he says he does NOT rest well, just in short intervals;  He goes to bed at 11PM using his BiPAP; wakes at 3-4AM & can't get back to sleep; maybe eats breakfast at 4AM and drifts back to sleep (w/o BiPAP) until 7AM or so; similarly he might nap for 1H during the day (w/o BiPAP); he currently thinks that the BiPAP makes no difference in his sleep & it would just as well suit him to stop it if able...  WE DISCUSSED REASSESSMENT w/ ABG on RA, and a HOME SLEEP TEST on RA to evaluate for sleep apnea and hypercarbic/ hypoxemic resp failure...     [facility] does his Home O2 (2L/min continuous) and [facility] does his nursing care    Andrew [name] also tells me he had a suspicious mole removed from his left post shoulder ~48mo ag69mox= melanoma, then wider excision by DERM[place] Severe COPD- GOLD Stage3, bullous emphysema, s/p resection of RUL bleb in 2007> on Advair250Bid & Spiriva  daily; he has NEB w/ Duoneb for prn use; he is off PRED now; Spirometry 09/2015 w/ FEV1=1.12 (37%); he finished his PulmRehab 07/2016 & now exercises regularly at the gym...    Chronic hypoxemic respiratory failure on O2 at 2L/min via POC> ambulatory O2sats 09/2015 dropped to 89% on 2L/min after 2 Laps; repeat 11/2016 dropped to 85% after 1Lap...    Hx prob hypercarbic resp failure inferred from Pt report of BiPAP ([facility]) since 2007, never been on CPAP, never had sleep study> we never received records from former physician teams or any of his old records; ABGs in EPIC> [facility] pH=7.44/ pCO2=35/ pO2=69 ?on RA; 11/2016 pH=7.44/ pCO2=39/ pO2=146 on 4Lnc;   => he has agreed to re-assessment here 2018 w/ ABG on RA and Home Sleep Study => pending...    Hx cor pulmonale/ pulmonary hypertension on Revatio20Tid since 2007 w/o additional med trials or dose adjustments> we do not have any of the objective data from his prev physician teams; 2DEcho here 09/2015 showed PAsys=36 and we tried to wean his Revatio but he refused due to "other reasons", finally compromised on Revatio20Bid...    Hx CAP- Hosp 12/2015 w/ RLL opac (nos) & responded to broad spectrum Ab coverage + Solumedrol, O2, Nebs, etc; readmitted 01/2016 w/ COPD exac- similar Rx but sent to NH for rehab at disch...    Cardiac issues> no cardiologist> ?nonobstructive CAD, cor pulmonale w/ mild pulmHTN & 2DEcho 09/2015 showing norm LVF w/ EF=55-60%, norm  wall motion, mild MR, mild RA dil, PAsys est 44mHg...    Medical issues> PCP= Dr. DLauretta GrillKoirala ([facility])>  HBP (on CardizemCD240), HL (on Cres10), hx thyroid nodule, GERD & prob LPR (GI= DrSchooler, on Prilosec40), Constipation, BPH (on Proscar5 & Flomax0.4) w/ hx urinary retention, insomnia (on Restoril30)...       NOTE> pt is Iron deficient & needs to f/u w/ his PCP & GI for this>> LABS 11/25/16>  Chems- wnl x BS=108;  A1c=6.1;  CBC- Hg=12.3, mcv=90, WBC=7.8K;  Fe=31 (6.6%sat) & Ferritin=8.7;   B12=661 EXAM shows Afeb, VSS, O2sat=90% on 2L/min pulse dose;  Heent- neg, mallampati1;  Chest- decr BS at bases, can't augment BS voluntarily, w/o w/r/r;  Heart- RR w/o m/r/g;  Abd- soft, neg;  Ext- neg w/o c/c/e;  Neuro- intact...  ABG on RA> pending (not done)  Home Sleep Test> pending (see below) IMP/PLAN>>  Andrew [name] has agreed to proceed w/ home sleep test to r/o OSA and we will further assess w/ ABG on RA to check for CO2 retention and any indication for BiPAP (doubt he will qualify at this time);  He will continue w/ his O2, Advair250, Spiriva, Duoneb vs ProventilHFA which he uses prn;  He does not appear to need the Revatio either but declines to discontinue this medication for other reasons.  ADDENDUM>>  Home Sleep Test done 05/20/17>  4H study showed mild OSA w/ AHI=6.0/hr;  Study done on RA-- lowest O2sat was 74% w/ an ave of 87%;  Pt given the option for in-lab CPAP titration study vs trial CPAP w/ autoset & a mask fit session w/ VLynnae Sandhoff  He prefers the latter & states he doesn't believe that he has OSA => we will arrange a mask fit session, the order a ResMed S10, air/ auto- set 5/15, w/ heated humidity, cliamate controlled tubing, enroll in airview w/ ROV in 6-8wks after starting for face to face and download... NOTE: he had the mask fit session w/ new FFM & O2 bleed-in at 3L/min; he was not willing to try the CPAP on Auto, says he requires BiPAP & therefore will need an in-lab CPAP/BiPAP titration test...  ~  July 31, 2017:  3676moOV & Andrew [name] had a Home Sleep Test 05/20/17- as above w/ AHI=6/hr & O2desat to 74% w/ ave 87%;  As noted he has been on BiPAP x yrs [place] to obtain any old records relating to his prev Dx of OSA & his need for BiPAP;  It is indeed unfortunate that he did not travel w/ [place] old [place] that he was INTOL to CPAP when tried  in the past;  The prob is that his old machine is wearing out & he is in need of a new machine- he is unable to afford one on his own;  I explained Medicare's rules for CPAP/ BiPAP & how he must follow their edicts about the repeat eval, and eventually the requirement for a download to prove compliance & efficacy-- in essence this means that he MUST have an in lab CPAP/BiPAP titration test, prove INTOL to CPAP to get the BiPAP then determine the BiPAP settings best for him;  This process is followed by a face to face visit in ~76m15mor download purposes...    NOTE: his PCP is DrKoirala ([employer], multimedia his GI is [facility]);  Pt saw ENT- Nordbladh,PA at [facility] on 06/24/17> c/o worsening dysphagia & nasal congestion, long hx GERD on PPI therapy, larynx exam showed polypoid changes of the cords, prom cricopharyngeus, and postglottic edema; prev EGD w/ smallHH, mod GERD; repeat laryngoscopy was essent neg- they ordered a Ba swallow, done 06/30/17 & showed sl retention in the valleculae that cleared w/ repeat swallowing, 7m barium tablet got lodged in the distal esoph & did not pass into the stomach; He again needs attention from his gastroenterologist for dilatation...  EXAM shows Afeb, VSS, O2sat=90% on 2L/min pulse dose;  Heent- neg, mallampati1;  Chest- decr BS at bases, can't augment BS voluntarily, w/o w/r/r;  Heart- RR w/o m/r/g;  Abd- soft, neg;  Ext- neg w/o c/c/e;  Neuro- intact... IMP/PLAN>>  As explained above we will order an in-lab CPAP/BiPAP titration study, and proceed from there...  ADDENDUM>>  Sleep Lab Titration study 10.18.18>  Optimal pressure= 7cmH2O w/ AHI at optimal pressure= 0/hr;  The study lasted 6.5H, no cardiac arrhythmias, no leg movements, he had O2desat lasting longer than 521m despite control in his sleep apnea w/ CPAP and 1L/min O2 was added to his CPAP;  He uses a small [employer] full face mask & heated humidification...  ~  September 22, 2017:  72m36moV & post-  hosp check>  [name] by [facility] w/ 4d hx incr cough, green sput, temp to 102 & incr SOB; ER eval revealed right basilar pneumonia, WBC=14.2, & O2sat=87% on 2L/min by [place];  He was treated w/ Zithromax & Rocephin, Solumedrol, Nebs, etc;  Cultures and serologies were all non-revealing & he was disch on Keflex, Pred, Nebs (Xopenex & Ipratripium), etc;  He also had PAF which converted spont & he was seen by [facility] and disch on Eliquis, Cardizem, Metoprolol... [facility]  We reviewed the following medical problems during today's office visit >>     Severe COPD- GOLD Stage3, bullous emphysema, s/p resection of RUL bleb in 2007> on Advair250Bid & Spiriva daily; he has NEB w/  Xopenex & Atrovent for prn use; he is off PRED now; Spirometry 09/2015 w/ FEV1=1.12 (37%); he finished his PulmRehab 07/2016 & now exercises regularly at the gym => we re-started Pred 34m33mpering sched down to 1/2 Qod by return visit...    Chronic hypoxemic respiratory failure on O2 at 2L/min via POC> ambulatory O2sats 09/2015 dropped to 89% on 2L/min after 2 Laps; repeat 11/2016 dropped to 85% after 1Lap... He uses O2 at 1L/min Qhs and 2L/min w/ activ.    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    Hx cor pulmonale/ pulmonary hypertension on Revatio20Tid since 2007 w/o additional med trials or dose adjustments> we do not have any of the objective data from his prev physician teams; 2DEcho here 09/2015 showed PAsys=36 and we tried to wean his Revatio but he refused due to "other reasons", finally compromised on Revatio20Bid...    Hx CAP- Hosp 2/17 & 10/18  w/ RLL opac (nos) & responded to broad spectrum Ab coverage + Solumedrol, O2, Nebs, etc; readmitted 01/2016 w/ COPD exac- similar  Rx but sent to NH for rehab at [place]...    Cardiac issues> ?nonobstructive CAD, cor pulmonale w/ mild pulmHTN & 2DEcho 09/2015 showing norm LVF w/ EF=55-60%, norm wall motion, mild MR, mild RA dil, PAsys est 24mHg...episode PAP 10/18 & converted spont- disch from hosp on Eliquis, Metoprolol, Cardizem & f/u w/ CAR[facility]-DrRandolph pending...    Medical issues> PCP= Dr. DLauretta GrillKoirala ([facility])>  HBP (on CardizemCD240), HL (on Cres10), hx thyroid nodule, GERD & prob LPR (GI= DrSchooler, on Prilosec40), Constipation, BPH (on Proscar5 & Flomax0.4) w/ hx urinary retention, anemia & insomnia (on Restoril30)... EXAM shows Afeb, VSS, O2sat=90% on 2L/min pulse dose;  Heent- neg, mallampati1;  Chest- decr BS at bases, can't augment BS voluntarily, w/o w/r/r;  Heart- RR w/o m/r/g;  Abd- soft, neg;  Ext- neg w/o c/c/e;  Neuro- intact...  CXR 09/09/17>  Norm heart size, interval patchy opac & effusion at right base, underlying COPD...  Mod barium swallow 09/10/17> mild pharyngeal dysphagia due to known esoph issues, mild asp risk, swallowing strategies outlined to pt...  LABS in Epic 08/2017>  Chems- ok BS~130, Cr=0.81, Alb=2.7, sl elev LFTs noted;  CBC- anemic w/ Hg=10.9;  TSH=0.52...   CXR 09/22/17 (independently reviewed by me in the PACS system) showed norm heart size, underlying COPD/emphysema, near complete clearing of the right basilar infiltrate... IMP/PLAN>>  We decided to continue his low dose Pred rx w/ 540mQod;  Continue other meds as prev including NEBs w/ Duoneb Tid followed by Advair250Bid & Spiriva once daily, Mucinex 1-2Bid, Tessalon tid as needed... We plan rov recheck in 6 wks...  ~  November 04, 2017:  6wk ROSavannahs improved and approaching his baseline- still w/ SOB/DOE but ADLs improved, cough/ phlegm stable, no CP/ edema; on Duoneb tid, Advair250Bid &  spiriva daily; on Mucinex, MMW, Tessalon as needed; Pred weaned to 1/2 Qod;  ?hold-up from [place] his new ResMed S10 CPAP set up as above...     He saw [facility]-DrRandolph 09/22/17>  HBP, PAF, HL;  On Eliquis, Metoprolol, Cardizem; Thyroid wnl & Echo w/ EF=55-60%, PAsys=33; felt to be stable- no changes made...     We reviewed his pulm issues/ medical problems during today's office visit-- see above... we do not have notes/ data from [facility]... EXAM shows Afeb, VSS, O2sat=96% on 2L/min pulse dose;  Heent- neg, mallampati1;  Chest- decr BS at bases, can't augment BS voluntarily, w/o w/r/r;  Heart- RR w/o m/r/g;  Abd- soft, neg;  Ext- neg w/o c/c/e;  Neuro- intact... IMP/PLAN>>  We discussed slowly weaning the Pred down to 22m422mod & continue this til ROV in 91mo322moe will watch BP/ pulse state w/ an eye towards weaning the BBlocker if able;  His appetite is improved, wt up 7#, and back in the gym daily!   ~  December 16, 2017:  6wk ROV & add-on appt requested for cough, yellow sput, dyspnea>  Andrew [name] weaned the Pred to 22mg 60m & then noticed grad increase in symptoms- cough w/ yellow sput, incr SOB w/ min exertion, etc;  He had ret to the gym & was doing satis until this deterioration;  He uses his CPAP Qhs- rests well, wakes feeling rested, denies daytime hypersomnolence but still naps 2-3/7, states driving is ok w/o drowsiness etc...     Severe COPD/ emphysema w/ bullous dis & prev RUL bleb resected 2007> on AdvaiJJHERD408iriva daily + Xopenex&Atrovent via NEB prn, and Pred5Qod...Marland KitchenMarland Kitchen  Chr hypoxemic resp failure on O2 at 2L/min via POC...    OSA- on CPAP7 & 1L/min O2 bleed-in.Marland KitchenMarland Kitchen Download 12/30-1/28/19 showed good compliance (30/30d, ave 7+H per night, on CPAP7 w/ min intermit leak, & good efficacy w/ AHI=1.4.Marland KitchenMarland Kitchen     Hx cor pulmonale/ pulmonary hypertension on Revatio20Tid since 2007 w/o additional med trials or dose adjustments=> we cut him to bid w/o any untoward result but he refused  to wean further... EXAM shows Afeb, VSS, O2sat=90% on 2L/min pulse dose;  Heent- neg, mallampati1;  Chest- decr BS at bases, can't augment BS voluntarily, w/o w/r/r;  Heart- RR w/o m/r/g;  Abd- soft, neg;  Ext- neg w/o c/c/e;  Neuro- intact... IMP/PLAN>>  We decided to treat his COPD exac w/ DOXY 139m Bid, and an incr Pred 559mtabs to 2tabs Qam for 1wk, then taper to 2-1 Qod, then 2-0 Qod til return;  Asked to continue NEBS Tid followed by Advair-Bid & Spiriva-Qd, + Mucinex600Bid, etc...     Past Medical History:  Diagnosis Date  . BiPAP (biphasic positive airway pressure) dependence    Pt denies history of OSA  . BPH (benign prostatic hyperplasia)   . Cancer (HCDaviess   skin  . COPD (chronic obstructive pulmonary disease) ([facility]  . Dysphagia   . Emphysema lung ([facility] [place]  . GERD (gastroesophageal reflux disease)    " Silent reflux"  . Hearing loss    right ear  . HLD (hyperlipidemia)   . Hypertension   . Oxygen dependent    2-3 liters  . Oxygen dependent   . Pneumonia   . Pulmonary hypertension (HCC)   Medical Hx>  HBP, ?nonobstructive CAD, HL, thyroid nodule, GERD, constipation, BPH, insomnia... Meds include>  CardizemCD240, Crestor10, Nexium20, Miralax, Proscar10, Restoril30...   Past Surgical History:  Procedure Laterality Date  . CARDIAC CATHETERIZATION    . CATARACT EXTRACTION W/ INTRAOCULAR LENS  IMPLANT, BILATERAL    . ESOPHAGOGASTRODUODENOSCOPY (EGD) WITH PROPOFOL N/A 08/23/2016   Procedure: ESOPHAGOGASTRODUODENOSCOPY (EGD) WITH PROPOFOL;  Surgeon: ViWilford CornerMD;  [facility];  Service: Endoscopy;  Laterality: N/A;  . LUNG SURGERY    . TONSILLECTOMY    [place]   Outpatient Encounter Medications as of 12/16/2017  Medication Sig  . ADVAIR DISKUS 250-50 MCG/DOSE AEPB Inhale 1 puff into the lungs 2 (two) times daily.  . Marland Kitchenlbuterol (PROVENTIL HFA;VENTOLIN HFA) 108 (90 Base) MCG/ACT inhaler Inhale 2 puffs into  the lungs every 6 (six) hours as needed for wheezing or shortness of breath.  . Marland Kitchenpixaban (ELIQUIS) 5 MG TABS tablet Take 1 tablet (5 mg total) by mouth 2 (two) times daily.  . benzonatate (TESSALON) 100 MG capsule Take 1 capsule (100 mg total) 3 (three) times daily by mouth.  . diltiazem (CARTIA XT) 240 MG 24 hr capsule Take 240 mg by mouth at bedtime.  . feeding supplement (BOOST / RESOURCE BREEZE) LIQD Take 1 Container by mouth 3 (three) times daily between meals.  . finasteride (PROSCAR) 5 MG tablet Take 5 mg by mouth at bedtime.   . fluticasone (FLONASE) 50 MCG/ACT nasal spray Place 1 spray into the nose daily as needed.  . Marland KitchenuaiFENesin (MUCINEX) 600 MG 12 hr tablet Take 1 tablet (600 mg total) by mouth 2 (two) times daily.  . Marland Kitchenpratropium-albuterol (DUONEB) 0.5-2.5 (3) MG/3ML SOLN Take 3 mLs by nebulization 3 (three) times daily.  . metoprolol tartrate (LOPRESSOR) 25 MG tablet Take 1 tablet (25 mg total)  by mouth 2 (two) times daily.  . Multiple Vitamins-Minerals (MULTIVITAMIN WITH MINERALS) tablet Take 1 tablet by mouth daily.  Marland Kitchen omeprazole (PRILOSEC) 40 MG capsule Take 40 mg by mouth daily.  Marland Kitchen OVER THE COUNTER MEDICATION Apply 1 application See admin instructions topically. Diaper rash  . OXYGEN Inhale 2-3 L into the lungs continuous. 2L daytime, 3L at night  . polyethylene glycol (MIRALAX / GLYCOLAX) packet Take 17 g by mouth daily. (Patient taking differently: Take 17 g by mouth at bedtime. Mix in 8 oz liquid and drink)  . predniSONE (DELTASONE) 5 MG tablet Take two tabs every other day.  . rosuvastatin (CRESTOR) 10 MG tablet Take 10 mg by mouth daily.  . sildenafil (REVATIO) 20 MG tablet Take 1 tablet (20 mg total) by mouth 2 (two) times daily.  Marland Kitchen SPIRIVA HANDIHALER 18 MCG inhalation capsule INHALE THE CONTENTS OF 1 CAPSULE EVERY DAY  . tamsulosin (FLOMAX) 0.4 MG CAPS capsule Take 1 capsule (0.4 mg total) by mouth daily after breakfast. (Patient taking differently: Take 0.4 mg by mouth at  bedtime. )  . temazepam (RESTORIL) 30 MG capsule Take 1 capsule (30 mg total) at bedtime by mouth.  . vitamin C (ASCORBIC ACID) 500 MG tablet Take 500 mg by mouth daily.  . [DISCONTINUED] predniSONE (DELTASONE) 5 MG tablet Take one tab every other day.  . [DISCONTINUED] predniSONE (DELTASONE) 5 MG tablet Take two tabs every other day.  Marland Kitchen doxycycline (VIBRA-TABS) 100 MG tablet Take 1 tablet (100 mg total) by mouth 2 (two) times daily.  . [DISCONTINUED] predniSONE (DELTASONE) 20 MG tablet Take as directed (Patient not taking: Reported on 12/16/2017)   No facility-administered encounter medications on file as of 12/16/2017.     No Known Allergies   Immunization History  Administered Date(s) Administered  . Influenza, High Dose Seasonal PF 07/22/2016, 08/18/2017  . Influenza-Unspecified 09/19/2015  . PPD Test 02/15/2016  . Pneumococcal Conjugate-13 09/19/2015    Current Medications, Allergies, Past Medical History, Past Surgical History, Family History, and Social History were reviewed in [employer] record.   Review of Systems             All symptoms NEG except where BOLDED >>  Constitutional:  F/C/S, fatigue, anorexia, unexpected weight change. HEENT:  HA, visual changes, hearing loss, earache, nasal symptoms, sore throat, mouth sores, hoarseness. Resp:  cough, sputum, hemoptysis; SOB, tightness, wheezing. Cardio:  CP, palpit, DOE, orthopnea, edema. GI:  N/V/D/C, blood in stool; reflux, abd pain, distention, gas. GU:  dysuria, freq, urgency, hematuria, flank pain, voiding difficulty. MS:  joint pain, swelling, tenderness, decr ROM; neck pain, back pain, etc. Neuro:  HA, tremors, seizures, dizziness, syncope, weakness, numbness, gait abn. Skin:  suspicious lesions or skin rash. Heme:  adenopathy, bruising, bleeding. Psyche:  confusion, agitation, sleep disturbance, hallucinations, anxiety, depression suicidal.   Objective:   Physical Exam       Vital  Signs:  Reviewed...   General:  WD, WN, 77 y/o WM in NAD; alert & oriented; pleasant & cooperative... HEENT:  [place]/AT; Conjunctiva- pink, Sclera- nonicteric, EOM-wnl, PERRLA, EACs-clear, TMs-wnl; NOSE-clear; THROAT-clear & wnl.  Neck:  Supple w/ fair ROM; no JVD; normal carotid impulses w/o bruits; no thyromegaly +small nodule palpated; no lymphadenopathy.  Chest:  Overinflated, resonant percussion note, decr BS bilat & can't augment BS voluntarily, no w/r/r heard... Heart:  Regular Rhythm; norm S1 & S2 without murmurs, rubs, or gallops detected. Abdomen:  Soft & nontender- no guarding or rebound; normal bowel  sounds; no organomegaly or masses palpated. Ext:  decr ROM; without deformities or arthritic changes; no varicose veins, +venous insuffic, no edema;  Pulses intact w/o bruits. Neuro:  No focal neuro deficits; sensory testing normal; gait normal & balance OK. Derm:  No lesions noted; no rash etc. Lymph:  No cervical, supraclavicular, axillary, or inguinal adenopathy palpated.   Assessment:      IMP >>     Severe COPD- GOLD Stage3, bullous emphysema, s/p resection of RUL bleb in 2007> on Advair250Bid & Spiriva daily; he has NEB w/ Duoneb for prn use; Spirometry 09/2015 w/ FEV1=1.12 (37%); he has completed pulm rehab & now exercises at the gym 5d/wk;  He's had mult COPD exac => treat w/ Levaquin, Pred taper, continue Advair/ spiriva/ NEBS/ O2...    Chronic hypoxemic respiratory failure on O2 at 2L/min via POC> ambulatory O2sats 09/2015 dropped to 89% on 2L/min after 2 Laps; he dropped to 85% after 1Lap on 3L/min POC 11/2016...    Hx prob hypercarbic resp failure inferred from Pt report of BiPAP ([facility]) since 2007, never been on CPAP, never had sleep study, unknown ABGs or pCO2 data> we never received data from his prev physicians.    Hx cor pulmonale/ pulmonary hypertension on Revatio20Tid since 2007 w/o additional med trials or dose adjustments> we do not have any of the objective data from  his prev physician teams; 2DEcho here 09/2015 showed PAsys=36 and we tried to wean his Revatio but he refused due to "other reasons", finally compromised on Revatio20Bid...    Hx CAP- Hosp 12/2015 w/ RLL opac (nos) & responded to broad spectrum Ab coverage + Sulomedrol, O2, Nebs, etc; readmitted 01/2016 w/ COPD exac- similar Rx but sent to NH for rehab at disch...    Hx of being on Home BiPAP since 2007 w/ eval & set up in [place]> we have been unable to obtain any old records regarding his initial studies and BiPAP set up...    Cardiac issues>  ?nonobstructive CAD, cor pulmonale w/ mild pulmHTN & 2DEcho 09/2015 showing norm LVF w/ EF=55-60%, norm wall motion, mild MR, mild RA dil, PAsys est 55mHg...    Medical issues include:  HBP (on CardizemCD240), HL (on Cres10), hx thyroid nodule, GERD (on Nexium40), constipation, BPH (on Proscar5 & Flomax0.4) w/ hx urinary retention, insomnia (on Restoril30)...       NOTE>  He is Iron deficient & need further eval & Rx from his PCP & GI...  PLAN >>  10/02/15>   MClanceyhas a distinctly patchy history to go along w/ his severe airflow obstruction & bullous emphysema;  We really need his old objective data from 2007 when he was started on O2 & BiPAP after his RUL bleb reduction surg;  He will try to get names and numbers for [place] in the meanwhile [facility] as we [place] out data base [place]  He is very concerned that he wants u[place]ato continue his current regimen which has served him well over the last 939yr  Continue Advair250Bid, Spiriva daily, Revatio20tid, O2 at 2L/min, and the BiPAP nightly as currently set...  11/01/15>  See prob list above- he is stable on this regimen but does not want to wean the Revatio further for "other" reasons; OK to continue current med regimen- advised regular exercise vs pulm rehab program; given ZPak for prn use over the winter & he knows to call for any resp  issues, incr dyspnea, etc... He remains on BiPAP but we do not have any data- no records received from prev pulm physicians, no notes from [facility], no download data from his machine- we will again try to make contact w/ his DME company... 03/18/16>   Andrew [name] has had a protracted resp [place] x2 this spring- triggered by prob RLL pneumonia (NOS) superimposed on his severe COPD/bullous emphysema;  Rec to change the Albut for Neb to [place] & continue treatments Tid; continue other meds regularly as outlined;  He is referred to [facility] & hopes to start soon;  We will plan ROV in 667mosooner if needed. 06/20/16>   Andrew [name] is stable on his baseline regimen + pulm rehab, etc; rec to continue current meds and exercise; he needs the 2017 Flu vaccine when avail & will call prn any breathing problems; we plan ROV in 655mo.  11/25/16>   There is no such thing as a mild exac for Andrew [name]- even the mildest of URIs can cause a severe COPD exac- we discussed Rx w/ Levaquin500 x7d, Pred2046m5d taper (see AVS), + Diflucan100 per his request; hopefully he will respond but he has little in the way of reversible factors given his severe emphysema; [facility] if symptoms worsen despite therapy; as noted he is Fe defic & needs further eval per his PCP & GI- copies sent. => subseq Hosp for 4d w/ IV Solumedrol, NEBs/ O2/ etc... 05/01/17>   Andrew [name] has agreed to proceed w/ home sleep test to r/o OSA and we will further assess w/ ABG on RA to check for CO2 retention and any indication for BiPAP (doubt he will qualify at this time);  He will continue w/ his O2, Advair250, Spiriva, Duoneb vs ProventilHFA which he uses prn;  He does not appear to need the Revatio either but declines to discontinue this medication for other reasons; 07/31/17>   As explained above we will order an in-lab CPAP/BiPAP titration study, and proceed from there. 08/2017>   He was Hosp by [facility] w/ RLL pneumonia (nos) and ac on chr hypoxemic  resp failure; disch on NEBS, Advair, Spiriva, Pred + Eliquis, Cardizem, Metoprolol for his PAF episode...  09/22/17>   We decided to continue his low dose Pred rx w/ 5mg64md;  Continue other meds as prev including NEBs w/ Duoneb Tid followed by Advair250Bid & Spiriva once daily, Mucinex 1-2Bid, Tessalon tid as needed... We plan rov recheck in 6 wks. 11/04/17>   We discussed slowly weaning the Pred down to 5mg 61m & continue this til ROV in 67mo; 45mowill watch BP/ pulse state w/ an eye towards weaning the BBlocker if able;  His appetite is improved, wt up 7#, and back in the gym daily! 12/16/17>   We decided to treat his COPD exac w/ DOXY 100mg B49mand an incr Pred 5mg tab26mo 2tabs Qam for 1wk, then taper to 2-1 Qod, then 2-0 Qod til return;  Asked to continue NEBS Tid followed by Advair-Bid & Spiriva-Qd, + Mucinex600Bid, etc.   Plan:     Patient's Medications  New Prescriptions   DOXYCYCLINE (VIBRA-TABS) 100 MG TABLET    Take 1 tablet (100 mg total) by mouth 2 (two) times daily.  Previous Medications   ADVAIR DISKUS 250-50 MCG/DOSE AEPB    Inhale 1 puff into the lungs 2 (two) times daily.   ALBUTEROL (PROVENTIL HFA;VENTOLIN HFA) 108 (90 BASE) MCG/ACT INHALER  Inhale 2 puffs into the lungs every 6 (six) hours as needed for wheezing or shortness of breath.   APIXABAN (ELIQUIS) 5 MG TABS TABLET    Take 1 tablet (5 mg total) by mouth 2 (two) times daily.   BENZONATATE (TESSALON) 100 MG CAPSULE    Take 1 capsule (100 mg total) 3 (three) times daily by mouth.   DILTIAZEM (CARTIA XT) 240 MG 24 HR CAPSULE    Take 240 mg by mouth at bedtime.   FEEDING SUPPLEMENT (BOOST / RESOURCE BREEZE) LIQD    Take 1 Container by mouth 3 (three) times daily between meals.   FINASTERIDE (PROSCAR) 5 MG TABLET    Take 5 mg by mouth at bedtime.    FLUTICASONE (FLONASE) 50 MCG/ACT NASAL SPRAY    Place 1 spray into the nose daily as needed.   GUAIFENESIN (MUCINEX) 600 MG 12 HR TABLET    Take 1 tablet (600 mg total) by mouth  2 (two) times daily.   IPRATROPIUM-ALBUTEROL (DUONEB) 0.5-2.5 (3) MG/3ML SOLN    Take 3 mLs by nebulization 3 (three) times daily.   METOPROLOL TARTRATE (LOPRESSOR) 25 MG TABLET    Take 1 tablet (25 mg total) by mouth 2 (two) times daily.   MULTIPLE VITAMINS-MINERALS (MULTIVITAMIN WITH MINERALS) TABLET    Take 1 tablet by mouth daily.   OMEPRAZOLE (PRILOSEC) 40 MG CAPSULE    Take 40 mg by mouth daily.   OVER THE COUNTER MEDICATION    Apply 1 application See admin instructions topically. Diaper rash   OXYGEN    Inhale 2-3 L into the lungs continuous. 2L daytime, 3L at night   POLYETHYLENE GLYCOL (MIRALAX / GLYCOLAX) PACKET    Take 17 g by mouth daily.   ROSUVASTATIN (CRESTOR) 10 MG TABLET    Take 10 mg by mouth daily.   SILDENAFIL (REVATIO) 20 MG TABLET    Take 1 tablet (20 mg total) by mouth 2 (two) times daily.   SPIRIVA HANDIHALER 18 MCG INHALATION CAPSULE    INHALE THE CONTENTS OF 1 CAPSULE EVERY DAY   TAMSULOSIN (FLOMAX) 0.4 MG CAPS CAPSULE    Take 1 capsule (0.4 mg total) by mouth daily after breakfast.   TEMAZEPAM (RESTORIL) 30 MG CAPSULE    Take 1 capsule (30 mg total) at bedtime by mouth.   VITAMIN C (ASCORBIC ACID) 500 MG TABLET    Take 500 mg by mouth daily.  Modified Medications   Modified Medication Previous Medication   PREDNISONE (DELTASONE) 5 MG TABLET predniSONE (DELTASONE) 5 MG tablet      Take two tabs every other day.    Take one tab every other day.  Discontinued Medications   PREDNISONE (DELTASONE) 20 MG TABLET    Take as directed

## 2017-12-16 NOTE — Progress Notes (Signed)
Subjective:     Patient ID: Andrew [name], male   DOB: September 30, 1941, 77 y.o.   MRN: [id number]  HPI  77 y/o WM, an ex-smoker quit in 1986, w/ severe bullous emphysema & hx of RUL bullous resection in 2007; Hx both ?hypercarbic & +hypoxemic resp failure w/ cor pulmonale & secondary pulm HTN on Revatio x yrs; He has been stable for >10 yrs on the same pulm regimen as he has travelled around the [place] living in [place], [place], and [place]...  ~  October 02, 2015:  Initial pulmonary evaluation by SN>  His PCP is Dr. Lujean Amel, [name]...       Andrew [name] is a 77 y/o gentleman from [place]- moved here to [place] ~53mo ago to live w/ his daughter & son-in-law;  He has a long convoluted history & we have none of his prev objective data to review;  He tells me that he has known about COPD/Emphysema for >10 yrs and in 2007 he had right thoracotomy & "bleb-ectomy";  After this procedure he was placed on Oxygen at 2L/min and BiPAP to use at night, along w/ ADVAIR250Bid & SPIRIVA daily, plus REVATIO20Tid for pulmonary HTN;  He was also treated w/ NEBS for about 1-69yrs then this was discontinued;  He has pretty much been on this same regimen for the past 9 yrs w/o much change, despite or maybe because he has moved around a lot- [place] (surg done there in 2007), to [place], back to [place], on to [place] for 6 yrs, then back to [place] over the last yr or so...  He describes himself as being rock-solid stable on this exact regimen since 2007- he had Cath (?left & right heart) in 2007, told 1 blockage, good LVF ?right heart results, started on O2, BiPAP, and Revatio but he does not know why?  He notes min cough when supine & in early AM attributed to reflux; min if any sput production, no hemoptysis, he denies SOB but states DOE "if I over-exert" eg- walking, lifting/carrying, stairs, etc; he notes that ADLs are ok- no problem (he is stoic);  He denies CP, palpit, f/c/s, edema... He hasn't  been to an ER since 2007 he says & that was also the last time he had any Pred; he thinks he had CXR, PFT, 2DEcho all earlier this yr...   Smoking Hx>  He is an ex-smoker, started in his teens, smoked for 30 yrs up to 1ppd, quit in 1986; This is a 30 pack-yr hx, he does not recall ever being checked for A1AT defic...  Pulmonary Hx>  COPD/ Emphysema w/ right upper lobe "bleb-ectomy) 2007 in [place]; he has chronic hypoxemic resp failure on O2 at 2L/min since 2007;  He tells me that he was started on BiPAP about that same time but he doesn't know why- never had sleep study, not on CPAP prev, he does not know about pCO2 levels etc ("I like the fresh cool air");  His BiPAP came from [facility] in [place]- states he does not know the settings, machine never downloaded, etc;  He has also been on Revatio20Tid since 2007, apparently never tried on other meds, dose never adjusted- he knows about "pulmonary hypertension" but he doesn't know any details and it doesn't appear to have been followed up, and meds kept the same from doctor to doctor...   Medical Hx>  HBP, ?nonobstructive CAD, HL, thyroid nodule, GERD, constipation, BPH, insomnia...  Family Hx>  Father died w/  Emphysema & was a former smoker; no other hx lung dis in the family; Alpha-1 status is unknown...  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   Current Meds>  Oxygen 2L/min pulse-dose concentrator, Advair250Bid, Spiriva daily, Revatio20Tid, CardizemCD240, Crestor10, Nexium20, Proscar5, Restoril30...  EXAM shows Afeb, VSS, O2sat=93% on 2L/min pulse-dose;  Heent- neg, mallampati1;  Chest- decr BS at bases, can't augment BS voluntarily, w/o w/r/r;  Heart- RR w/o m/r/g;  Abd- soft, neg;  Ext- neg w/o c/c/e;  Neuro- intact...  CXR 10/02/15 showed norm heart  size, COPD, bullous emphysema/ hyperinflation, scarring right apex, NAD...   Spirometry 10/02/15 showed FVC=2.70 (69%), FEV1=1.12 (37%), %1sec=41, mid-flows reduced at 18% predicted; this is c/w severe airflow obstruction & GOLD Stage 3 COPD  Ambulatory oxygen saturation test 10/02/15> on O2 at 2L/min pulse-dose concentrator: O2sat=96% on 2L at rest w/ pulse=87; he walked 2 laps w/ his O2, stopped due to dyspnea, lowest O2sat=89% w/ pulse=113/min...  LAB 10/02/15>  Alpha-1-Antitrypsin level => pending (he never went to the lab for this blood test)  2DEchocardiogram 10/09/15 showed norm LVF w/ EF=55-60%, norm wall motion, mild MR, mild RA dil, PAsys est 49mmHg... Pt on Revatio 20Tid x 58yrs & I rec we wean slowly (Decr to Bid now)...    IMP >>     COPD/ bullous emphysema> s/p RUL "bleb-ectomy" in 2007, severe airflow obstruction w/ GOLD Stage3 COPD> on Advair250Bid & Spiriva daily; apparently he has no use for a rescue inhaler; prev on NEB w/ Albut but not for several yrs.     Chronic hypoxemic respiratory failure on O2 at 2L/min via pulse-dose concentrator...    Pt reports using BiPAP since 2007, never been on CPAP, never had sleep study he says, unknown ABGs or pCO2 data; machine from [place] we will try to get the old data => none received.    Hx pulmonary hypertension on Revatio20Tid since 2007 w/o additional med trials or dose adjustments> we do not have any of the objective data from his prev physician teams... Current 2DEcho w/ PAsys est 60mmHg & we will wean the Revatio to Bid at this point => he does not want to wean further for "other" reasons...    Medical issues include:  HBP, ?nonobstructive CAD, HL, thyroid nodule, GERD, BPH, insomnia... PLAN >>     Andrew [name] has a distinctly patchy history to go along w/ his severe airflow obstruction & bullous emphysema;  We really need his old objective data from 2007 when he was started on O2 & BiPAP after his RUL bleb reduction surg;  He will try to  get names and numbers for [place]- in the meanwhile we will contact his last physician in [facility] for their more recent data as we establish out data base here in [place];  He is very concerned that he wants [place] to continue his current regimen which has served him well over the last 66yrs;  Continue Advair250Bid, Spiriva daily, Revatio20Tid=>Bid, O2 at 2L/min, and the BiPAP nightly as currently set... We plan ROV recheck in 1 month... NOTE> 2DEcho w/ PAsys est ~70mmHg & we will slowly wean Revatio...  ~  November 01, 2015:  18mo ROV & Andrew [name] reports stable, doing satis & notes no untoward effects from cutting the Revatio to 20mg Bid; he denies CP, palpit, incr SOB, edema,  etc; he continues on the O2 at 2L/min, BiPAP from [place], [place] once daily; we have not received any records from his mult physicians ([place], [place], [place])...    EXAM shows Afeb, VSS, O2sat=92% on 2L/min pulse-dose;  Heent- neg, mallampati1;  Chest- decr BS at bases, can't augment BS voluntarily, w/o w/r/r;  Heart- RR w/o m/r/g;  Abd- soft, neg;  Ext- neg w/o c/c/e;  Neuro- intact... IMP/PLAN>>  See prob list above- he is stable on this regimen but does not want to wean the Revatio further for "other" reasons; OK to continue current med regimen- advised regular exercise vs pulm rehab program; given ZPak for prn use over the winter & he knows to call for any resp issues, incr dyspnea, etc... He remains on BiPAP but we do not have any data- no records received from prev pulm physicians, no notes from [facility], no download data from his machine- we will again try to make contact w/ his DME company... We plan ROV in 3-25mo.  ~  Mar 18, 2016:  4-17mo ROV & post hosp visit>  Andrew [name] has severe COPD/Emphysema, GOLD Stage 3 w/ FEV1=1.12L (37%predicted) in [id number],  Hx right thoracotomy & "bleb-ectomy" in 2007;  after this procedure he says he was placed on Oxygen at 2L/min and BiPAP to use at night, along w/ ADVAIR250Bid,  SPIRIVA daily, and REVATIO20Tid for pulmonary HTN (2DEcho here 11/16 showed PAsys est=33mmHg)-- we do not have any of that data from [place] (we have been unsuccessful in obtaining old data from any of his prev physicians); he has been resistent to any adjustment in his medication regimen for various reasons...     He's been [facility] x2 since last OV> 1st [facility] 2/9 - 01/03/16 by [facility] w/ CAP- CXR showed severe bullous emphysema, incr markings in RLL and atelectasis in R-mid lung, Temp 103, Lactate=2.4, WBC 16K; treated w/ O2, Solumedrol=>Pred, Zosyn/Vanco=>Levaquin, NEBs, etc; disch home w/ home health help- ?seen by his PCP after disch...    2nd [facility] 3/26 - 02/15/16 by [facility] w/ 1d hx incr SOB, wheezing, productive cough & felt to have a COPD exac; CXR showed his COPE/E, persistent incr markings in right base, WBC was elev at 20K, BNP=60, no pos cultures; he was treaed w/ O2, Solumedrol, Roceph/Zithro, NEBs, etc; he was disch on Levaquin & Pred; he developed urinary retention & foley placed (weaned off in NH); he was debilitated & sent to NH for rehab...     He was disch to [facility] for rehab & attended by [facility] note dated 03/01/16 reviewed-- finished Levaquin, weaned off the Pred, they were able to discontinue the foley & he passed voiding trial; disch home after 19d in rehab...     Now back home on same O2= 2L/min days & 3L/min night w/ BiPAP ?settings (he has been on this for yrs and never re-assessed), NEB w/ AlbutTid, Advair250Bid, Spiriva daily, Revatio20Bid (pt refuses to taper this med further);  He has developed pedal edema x3d & needs a low sodium diet + diuretic started but he tells me he is sched to see his PCP soon for this problem...    EXAM shows Afeb, VSS, O2sat=95% on 2L/min pulse-dose;  Heent- neg, mallampati1;  Chest- decr BS at bases, can't augment BS voluntarily, w/o w/r/r;  Heart- RR w/o m/r/g;  Abd- soft, neg;  Ext- neg w/o c/c/e;  Neuro- intact...  CXR 02/11/16 showed  hyperinflation, bullous emphysema, some scarring in right mid lung & both lower  lobes, no infiltrate, no edema...  LABS 01/2016> all reviewed in Epic... IMP/PLAN>>  Andrew [name] has had a protracted resp exac- triggered by prob RLL pneumonia (NOS) superimposed on his severe COPD/bullous emphysema;  Rec to change the Albut for Neb to [place] & continue treatments Tid; continue other meds regularly as outlined;  He is referred to [facility] & hopes to start soon;  We will plan ROV in 75mo sooner if needed.  ~  June 20, 2016:  65mo ROV w/ SN>  Andrew [name] returns for a 69mo ROV & states that he is doing very well- no new complaints or concerns at this time, his PCP is DrKoirala; He started [facility] in June & continues in this program at present; Pt prev inquired about treatments at "[facility]" for regenerative lung tissue (stem cell therapy) which costs ~$10K per treatment & all benefits are antecdotal; I offered to refer him to [facility] [place] [facility] if he wants cutting edge research approach...     EPIC records indicate ER visit 05/08/16 for Abd Pain> VSS, exam was neg, CT Abd showed large fecal burden otherw neg, distended urinary bladder, atherosclerosis, bilat L5 pars defects; Rec to take laxatives...     Severe COPD- GOLD Stage3, bullous emphysema, s/p resection of RUL bleb in 2007> on Advair250Bid & Spiriva daily; he has NEB w/ Duoneb for prn use; Spirometry 09/2015 w/ FEV1=1.12 (37%); he is enrolled in [place] & wants a POC instead of tanks;     Chronic hypoxemic respiratory failure on O2 at 2L/min via POC> ambulatory O2sats 09/2015 dropped to 89% on 2L/min after 2 Laps.    Hx prob hypercarbic resp failure inferred from Pt report of BiPAP ([facility]) since 2007, never been on CPAP, never had sleep study, unknown ABGs or pCO2 data>    Hx cor pulmonale/ pulmonary hypertension on Revatio20Tid since 2007 w/o additional med trials or dose adjustments> we do not have any of the objective data from his prev  physician teams; 2DEcho here 09/2015 showed PAsys=36 and we tried to wean his Revatio but he refused due to "other reasons", finally compromised on Revatio20Bid...    Hx CAP- Hosp 12/2015 w/ RLL opac (nos) & responded to broad spectrum Ab coverage + Sulomedrol, O2, Nebs, etc; readmitted 01/2016 w/ COPD exac- similar Rx but sent to NH for rehab at disch...    Cardiac issues>  ?nonobstructive CAD, cor pulmonale w/ mild pulmHTN & 2DEcho 09/2015 showing norm LVF w/ EF=55-60%, norm wall motion, mild MR, mild RA dil, PAsys est 8mmHg...    Medical issues include:  HBP (on CardizemCD240), HL (on Cres10), hx thyroid nodule, GERD (on Nexium40), Constipation, BPH (on Proscar5 & Flomax0.4) w/ hx urinary retention, insomnia (on Restoril30)... EXAM shows Afeb, VSS, O2sat=93% on 2L/min pulse-dose;  Heent- neg, mallampati1;  Chest- decr BS at bases, can't augment BS voluntarily, w/o w/r/r;  Heart- RR w/o m/r/g;  Abd- soft, neg;  Ext- neg w/o c/c/e;  Neuro- intact...  CXR 05/08/16>  Mod bullous emphysema w/ chr changes at the lung bases w/ pleuroparenchymal scarring, surg suture lines ove the right mid lung...  LABS 04/2016 in epic> Chems- ok, BS=149;  CBC- anemia w/ Hg= 10.4-11.7, WBC=15K IMP/PLAN>>  Andrew [name] is stable on his baseline regimen + pulm rehab, etc; rec to continue current meds and exercise; he needs the 2017 Flu vaccine when avail & will call prn any breathing problems; we plan ROV in 72mo...   ADDENDUM>>  Pt Hosp by [facility] 8/11 -  07/01/16 w/ CAP after presenting w/ incr SOB, decr O2sats at pulm rehab, & chills 9he lives w/ school aged grandkids)- CXR showed ?RLL opac, cultures neg & NOS- treated empirically w/ Rocephin & Zithromax, Solumedrol, O2, NEBS, etc; he improved gradually & disch home;  He went to see ENT 8/29 because he thought the pneumonia may have been from reflux & dysphagia- fiberoptic exam revealed some edema & pooling of secretions c/w LPR=> placed on PPI Bid and referred to GI;  Barium esophagram  showed a [place] & mod GERD but no esophagitis mass or stricture;  He saw DrSchooler & had EGD 08/23/16 showing a [place], patchy candida=> treated w/ Nystatin, later required Diflucan...   ADDENDUM>>  Pt had colonoscopy 08/23/16 by DrSchooler-- small HH, patchy candida, mild gastitis seen;  Treated w/ Nystatin, continue Prilosec40/d...   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      Severe COPD- GOLD Stage3, bullous emphysema, s/p resection of RUL bleb in 2007> on Advair250Bid & Spiriva daily; he has NEB w/ Duoneb for prn use; Spirometry 09/2015 w/ FEV1=1.12 (37%); he finished his PulmRehab 07/2016 & now exercises 5d/wk at the gym; he finally got his POC that he wanted...    Chronic hypoxemic respiratory failure on O2 at 2L/min via POC> ambulatory O2sats 09/2015 dropped to 89% on 2L/min after 2 Laps; repeat 11/2016 dropped to 85%after 1Lap...    Hx prob hypercarbic resp failure inferred from Pt report of BiPAP ([facility]) since 2007, never been on CPAP, never had sleep study, unknown ABGs or pCO2 data> we never received records from former physician teams or any of his old records...     Hx cor pulmonale/ pulmonary hypertension on Revatio20Tid since 2007 w/o additional med trials or dose adjustments> we do not have any of the objective data from his prev physician teams; 2DEcho here 09/2015 showed PAsys=36 and we tried to wean his Revatio but he refused due to "other reasons", finally compromised on Revatio20Bid...    Hx CAP-  Hosp 12/2015 w/ RLL opac (nos) & responded to broad spectrum Ab coverage + Solumedrol, O2, Nebs, etc; readmitted 01/2016 w/ COPD exac- similar Rx but sent to NH for rehab at disch...    Cardiac issues>  ?nonobstructive CAD, cor pulmonale w/ mild pulmHTN & 2DEcho 09/2015 showing norm LVF w/ EF=55-60%, norm wall motion, mild MR, mild RA dil, PAsys est 23mmHg...    Medical issues include:  HBP (on CardizemCD240), HL (on Cres10), hx thyroid nodule, GERD & prob LPR (on Nexium40), Constipation, BPH (on Proscar5 & Flomax0.4) w/ hx urinary retention, insomnia (on Restoril30)...       NOTE> pt is Iron deficient (see below) & needs to f/u w/ his PCP & GI for this...  EXAM shows Afeb, VSS, O2sat=92% on 3L/min;  Heent- neg, mallampati1;  Chest- decr BS at bases, can't augment BS voluntarily, w/o w/r/r;  Heart- RR w/o m/r/g;  Abd- soft, neg;  Ext-  neg w/o c/c/e;  Neuro- intact...  CXR 06/28/16>  Hyperinflation, marked emphysematous changes, crowed lung markings at the bases, no consolidation or effusions...  CXR 11/25/16>  COPD, bullous emphysema, and bilat pleuroparenchymal changes c/w scarring, no acute abnormality  Ambulatory O2sat on 3L/min pulse-dose POC>  O2sat=98% on 3L/min pulse-dose at rest w/ HR=90/min;  He ambulated only 1Lap (185') w/ O2sat drop to 85% w/ HR=108/min...  LABS 11/25/16>  Chems- wnl x BS=108;  A1c=6.1;  CBC- Hg=12.3, mcv=90, WBC=7.8K;  Fe=31 (6.6%sat) & Ferritin=8.7;  B12=661 IMP/PLAN>>  There is no such thing as a mild exac for Andrew [name]- even the mildest of URIs can cause a severe COPD exac- we discussed Rx w/ Levaquin500 x7d, Pred20mg - 5d taper (see AVS), + Diflucan100 per his request; hopefully he will respond but he has little in the way of reversible factors given his severe emphysema; reminded to go to the ER for [facility] admission if symptoms worsen despite therapy; as noted he is Fe defic & needs further eval per his PCP & GI- copies sent...  ~  December 23, 2016:  69mo ROV & post hospital  check>  Andrew [name] was [facility] 1/20 - 12/11/16 by Diona Browner w/ a COPD exac w/ acute on chronic resp failure;  Pt already on Home O2 at 2L/min and BiPAP Qhs; he had a root canal done, developed a sore throat, then cough, etc; Levaquin & Pred called -in for pt, Hosp for 4d w/ IV solumedrol & ultimately disch on Pred10... Currently c/o DOE w/ ADLs which is new to him as prev he was going to the gym etc...     He is currently taking Duoneb Q6H as needed + Advair250Bid, Spiriva via handihaler daily, Pred 10mg  tabs- slow taper... EXAM shows Afeb, VSS, O2sat=94% on 2L/min;  Heent- neg, mallampati1;  Chest- decr BS at bases, can't augment BS voluntarily, w/o w/r/r;  Heart- RR w/o m/r/g;  Abd- soft, neg;  Ext- neg w/o c/c/e;  Neuro- intact...  CXR 12/07/16 (independently reviewed by me in the PACS system)> norm heart size, calcif Ao, severe COPD w/ apical bullous changes & surg staples on right, NAD.Marland KitchenMarland Kitchen  EKG 12/07/16 showed NSR, rate 87, poor R prog V1-2, otherw WNL.Marland KitchenMarland Kitchen  LABS 11/2016> NOT retaining CO2 (ABG w/ pCO2=39;  Chems- ok;  CBC- ok w/ Hg=11-12 and wbc=21=>12;  BNP= wnl... IMP/PLAN>>  Andrew [name] had a COPD exac & required IV Solumedrol + in hosp attention w/ NEBS etc & now he is approaching his baseline; he called for an order for a Hosp bed but was referred to his PCP for this determination;  We reviewed the NEED for DUONEB Tid followed by Advair250Bid & Spiriva once daily; we will wean his Pred from 10mg /s to 5mg /d & plan ROV receheck in 6wks...  NOTE: >50% of this 75min rov was spent in counseling & coordination of care...  ~  January 28, 2017:  6wk [place] is stable on meds + Pred 5mg /d;  He called 01/15/17 for antibiotics for an infected tooth- called in Augmentin & tooth has been extracted by dentist;  He remains on NEBs w/ Duoneb Tid, followed by Advair250Bid and Spiriva daily;  He reports using his Revatio "prn";  His breathing continues to improve, back in the gym 3d/wk, approaching his baseline he says;  Denies  cough, sput, hemoptysis, CP, etc... We reviewed his prob list as above...  EXAM shows Afeb, VSS, O2sat=96% on 2L/min;  Heent- neg, mallampati1;  Chest- decr BS at bases, can't augment BS  voluntarily, w/o w/r/r;  Heart- RR w/o m/r/g;  Abd- soft, neg;  Ext- neg w/o c/c/e;  Neuro- intact... IMP/PLAN>>  Andrew [name] continues to improve & move toward his baseline; Rec to continue Pred5mg /d thru March then decr to 5mg  Qod til return visit in 23mo...  ~  May 01, 2017:  70mo ROV & Andrew [name] reports a good 64mo interval w/o acute exac and w/o new complaints or concerns; he is back in the gym 3d/wk & back to baseline he says;  As prev noted Andrew [name] is on BiPAP & has been for yrs- initiated by a pulm physician in [place] yrs ago & I presume this was done for COPD & CO2 retention ant NOT because of sleep apnea; he does not know his settings 7 we have been unable to get old records indicating his initial parameters or follow up monitoring; cureent problem is that his machine is old, mask is 79yr old, no tubing supplies in a long time;  For his part he says he does NOT rest well, just in short intervals;  He goes to bed at 11PM using his BiPAP; wakes at 3-4AM & can't get back to sleep; maybe eats breakfast at 4AM and drifts back to sleep (w/o BiPAP) until 7AM or so; similarly he might nap for 1H during the day (w/o BiPAP); he currently thinks that the BiPAP makes no difference in his sleep & it would just as well suit him to stop it if able...  WE DISCUSSED REASSESSMENT w/ ABG on RA, and a HOME SLEEP TEST on RA to evaluate for sleep apnea and hypercarbic/ hypoxemic resp failure...     [facility] does his Home O2 (2L/min continuous) and [facility] does his nursing care    Andrew [name] also tells me he had a suspicious mole removed from his left post shoulder ~24mo ago; Bx= melanoma, then wider excision by Sherre Lain at the [facility]...     Severe COPD- GOLD Stage3, bullous emphysema, s/p resection of RUL bleb in 2007> on Advair250Bid & Spiriva  daily; he has NEB w/ Duoneb for prn use; he is off PRED now; Spirometry 09/2015 w/ FEV1=1.12 (37%); he finished his PulmRehab 07/2016 & now exercises regularly at the gym...    Chronic hypoxemic respiratory failure on O2 at 2L/min via POC> ambulatory O2sats 09/2015 dropped to 89% on 2L/min after 2 Laps; repeat 11/2016 dropped to 85% after 1Lap...    Hx prob hypercarbic resp failure inferred from Pt report of BiPAP ([facility]) since 2007, never been on CPAP, never had sleep study> we never received records from former physician teams or any of his old records; ABGs in [facility] 12/2015 pH=7.44/ pCO2=35/ pO2=69 ?on RA; 11/2016 pH=7.44/ pCO2=39/ pO2=146 on 4Lnc;   => he has agreed to re-assessment here 2018 w/ ABG on RA and Home Sleep Study => pending...    Hx cor pulmonale/ pulmonary hypertension on Revatio20Tid since 2007 w/o additional med trials or dose adjustments> we do not have any of the objective data from his prev physician teams; 2DEcho here 09/2015 showed PAsys=36 and we tried to wean his Revatio but he refused due to "other reasons", finally compromised on Revatio20Bid...    Hx CAP- Hosp 12/2015 w/ RLL opac (nos) & responded to broad spectrum Ab coverage + Solumedrol, O2, Nebs, etc; readmitted 01/2016 w/ COPD exac- similar Rx but sent to NH for rehab at disch...    Cardiac issues> no cardiologist> ?nonobstructive CAD, cor pulmonale w/ mild pulmHTN & 2DEcho 09/2015 showing norm LVF w/  EF=55-60%, norm wall motion, mild MR, mild RA dil, PAsys est 50mmHg...    Medical issues> PCP= Dr. Lauretta Grill Koirala ([facility])>  HBP (on CardizemCD240), HL (on Cres10), hx thyroid nodule, GERD & prob LPR (GI= DrSchooler, on Prilosec40), Constipation, BPH (on Proscar5 & Flomax0.4) w/ hx urinary retention, insomnia (on Restoril30)...       NOTE> pt is Iron deficient & needs to f/u w/ his PCP & GI for this>> LABS 11/25/16>  Chems- wnl x BS=108;  A1c=6.1;  CBC- Hg=12.3, mcv=90, WBC=7.8K;  Fe=31 (6.6%sat) & Ferritin=8.7;   B12=661 EXAM shows Afeb, VSS, O2sat=90% on 2L/min pulse dose;  Heent- neg, mallampati1;  Chest- decr BS at bases, can't augment BS voluntarily, w/o w/r/r;  Heart- RR w/o m/r/g;  Abd- soft, neg;  Ext- neg w/o c/c/e;  Neuro- intact...  ABG on RA> pending (not done)  Home Sleep Test> pending (see below) IMP/PLAN>>  Andrew [name] has agreed to proceed w/ home sleep test to r/o OSA and we will further assess w/ ABG on RA to check for CO2 retention and any indication for BiPAP (doubt he will qualify at this time);  He will continue w/ his O2, Advair250, Spiriva, Duoneb vs ProventilHFA which he uses prn;  He does not appear to need the Revatio either but declines to discontinue this medication for other reasons.  ADDENDUM>>  Home Sleep Test done 05/20/17>  4H study showed mild OSA w/ AHI=6.0/hr;  Study done on RA-- lowest O2sat was 74% w/ an ave of 87%;  Pt given the option for in-lab CPAP titration study vs trial CPAP w/ autoset & a mask fit session w/ Lynnae Sandhoff.  He prefers the latter & states he doesn't believe that he has OSA => we will arrange a mask fit session, the order a ResMed S10, air/ auto- set 5/15, w/ heated humidity, cliamate controlled tubing, enroll in airview w/ ROV in 6-8wks after starting for face to face and download... NOTE: he had the mask fit session w/ new FFM & O2 bleed-in at 3L/min; he was not willing to try the CPAP on Auto, says he requires BiPAP & therefore will need an in-lab CPAP/BiPAP titration test...  ~  July 31, 2017:  26mo ROV & Andrew [name] had a Home Sleep Test 05/20/17- as above w/ AHI=6/hr & O2desat to 74% w/ ave 87%;  As noted he has been on BiPAP x yrs originally prescribed by MD in [place]- we have been unable to obtain any old records relating to his prev Dx of OSA & his need for BiPAP;  It is indeed unfortunate that he did not travel w/ his old records when he left [place] for [place], then back to [place], then on to 2 places in [place]; He tells me that he was INTOL to CPAP when tried  in the past;  The prob is that his old machine is wearing out & he is in need of a new machine- he is unable to afford one on his own;  I explained Medicare's rules for CPAP/ BiPAP & how he must follow their edicts about the repeat eval, and eventually the requirement for a download to prove compliance & efficacy-- in essence this means that he MUST have an in lab CPAP/BiPAP titration test, prove INTOL to CPAP to get the BiPAP then determine the BiPAP settings best for him;  This process is followed by a face to face visit in ~79mo for download purposes...    NOTE: his PCP is DrKoirala Programmer, [facility]) & his GI is  DrSchooler ([facility]); Pt saw ENT- Nordbladh,PA at [facility] on 06/24/17> c/o worsening dysphagia & nasal congestion, long hx GERD on PPI therapy, larynx exam showed polypoid changes of the cords, prom cricopharyngeus, and postglottic edema; prev EGD w/ smallHH, mod GERD; repeat laryngoscopy was essent neg- they ordered a Ba swallow, done 06/30/17 & showed sl retention in the valleculae that cleared w/ repeat swallowing, 3mm barium tablet got lodged in the distal esoph & did not pass into the stomach; He again needs attention from his gastroenterologist for dilatation...  EXAM shows Afeb, VSS, O2sat=90% on 2L/min pulse dose;  Heent- neg, mallampati1;  Chest- decr BS at bases, can't augment BS voluntarily, w/o w/r/r;  Heart- RR w/o m/r/g;  Abd- soft, neg;  Ext- neg w/o c/c/e;  Neuro- intact... IMP/PLAN>>  As explained above we will order an in-lab CPAP/BiPAP titration study, and proceed from there...  ADDENDUM>>  Sleep Lab Titration study 10.18.18>  Optimal pressure= 7cmH2O w/ AHI at optimal pressure= 0/hr;  The study lasted 6.5H, no cardiac arrhythmias, no leg movements, he had O2desat lasting longer than 14min despite control in his sleep apnea w/ CPAP and 1L/min O2 was added to his CPAP;  He uses a small [employer] full face mask & heated humidification...  ~  September 22, 2017:  31mo ROV & post-  hosp check>  Andrew [name] was [facility] 10/23 - 09/12/17 by [facility] w/ 4d hx incr cough, green sput, temp to 102 & incr SOB; ER eval revealed right basilar pneumonia, WBC=14.2, & O2sat=87% on 2L/min by [place];  He was treated w/ Zithromax & Rocephin, Solumedrol, Nebs, etc;  Cultures and serologies were all non-revealing & he was disch on Keflex, Pred, Nebs (Xopenex & Ipratripium), etc;  He also had PAF which converted spont & he was seen by [facility] and disch on Eliquis, Cardizem, Metoprolol... His PCP is [facility]oirala at [facility]...  We reviewed the following medical problems during today's office visit >>     Severe COPD- GOLD Stage3, bullous emphysema, s/p resection of RUL bleb in 2007> on Advair250Bid & Spiriva daily; he has NEB w/  Xopenex & Atrovent for prn use; he is off PRED now; Spirometry 09/2015 w/ FEV1=1.12 (37%); he finished his PulmRehab 07/2016 & now exercises regularly at the gym => we re-started Pred 20mg  tapering sched down to 1/2 Qod by return visit...    Chronic hypoxemic respiratory failure on O2 at 2L/min via POC> ambulatory O2sats 09/2015 dropped to 89% on 2L/min after 2 Laps; repeat 11/2016 dropped to 85% after 1Lap... He uses O2 at 1L/min Qhs and 2L/min w/ activ.    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    Hx cor pulmonale/ pulmonary hypertension on Revatio20Tid since 2007 w/o additional med trials or dose adjustments> we do not have any of the objective data from his prev physician teams; 2DEcho here 09/2015 showed PAsys=36 and we tried to wean his Revatio but he refused due to "other reasons", finally compromised on Revatio20Bid...    Hx CAP- Hosp 2/17 & 10/18  w/ RLL opac (nos) & responded to broad spectrum Ab coverage + Solumedrol, O2, Nebs, etc; readmitted 01/2016 w/ COPD  exac- similar Rx but sent to NH for rehab at disch...    Cardiac issues> ?nonobstructive CAD, cor pulmonale w/ mild pulmHTN & 2DEcho 09/2015 showing norm LVF w/ EF=55-60%, norm wall motion, mild MR, mild RA dil, PAsys est 28mmHg...episode PAP 10/18 & converted spont- disch from hosp on Eliquis, Metoprolol, Cardizem & f/u w/ [facility]-DrRandolph pending...    Medical issues> PCP= Dr. Lauretta Grill Koirala ([facility])>  HBP (on CardizemCD240), HL (on Cres10), hx thyroid nodule, GERD & prob LPR (GI= DrSchooler, on Prilosec40), Constipation, BPH (on Proscar5 & Flomax0.4) w/ hx urinary retention, anemia & insomnia (on Restoril30)... EXAM shows Afeb, VSS, O2sat=90% on 2L/min pulse dose;  Heent- neg, mallampati1;  Chest- decr BS at bases, can't augment BS voluntarily, w/o w/r/r;  Heart- RR w/o m/r/g;  Abd- soft, neg;  Ext- neg w/o c/c/e;  Neuro- intact...  CXR 09/09/17>  Norm heart size, interval patchy opac & effusion at right base, underlying COPD...  Mod barium swallow 09/10/17> mild pharyngeal dysphagia due to known esoph issues, mild asp risk, swallowing strategies outlined to pt...  LABS in Epic 08/2017>  [place]- ok BS~130, Cr=0.81, Alb=2.7, sl elev LFTs noted;  CBC- anemic w/ Hg=10.9;  TSH=0.52...   CXR 09/22/17 (independently reviewed by me in the PACS system) showed norm heart size, underlying COPD/emphysema, near complete clearing of the right basilar infiltrate... IMP/PLAN>>  We decided to continue his low dose Pred rx w/ 5mg  Qod;  Continue other meds as prev including NEBs w/ Duoneb Tid followed by Advair250Bid & Spiriva once daily, Mucinex 1-2Bid, Tessalon tid as needed... We plan rov recheck in 6 wks...   ~  November 04, 2017:  6wk Radium is improved and approaching his baseline- still w/ SOB/DOE but ADLs improved, cough/ phlegm stable, no CP/ edema; on Duoneb tid, Advair250Bid &  spiriva daily; on Mucinex, MMW, Tessalon as needed; Pred weaned to 1/2 Qod;  ?hold-up from [place] for his new ResMed S10 CPAP set up as above...     He saw [facility]-DrRandolph 09/22/17>  HBP, PAF, HL;  On Eliquis, Metoprolol, Cardizem; Thyroid wnl & Echo w/ EF=55-60%, PAsys=33; felt to be stable- no changes made...     We reviewed his pulm issues/ medical problems during today's office visit-- see above... we do not have notes/ data from his PCP [facility]ala at [facility]... EXAM shows Afeb, VSS, O2sat=96% on 2L/min pulse dose;  Heent- neg, mallampati1;  Chest- decr BS at bases, can't augment BS voluntarily, w/o w/r/r;  Heart- RR w/o m/r/g;  Abd- soft, neg;  Ext- neg w/o c/c/e;  Neuro- intact... IMP/PLAN>>  We discussed slowly weaning the Pred down to 5mg  Qod & continue this til ROV in 13mo;  We will watch BP/ pulse state w/ an eye towards weaning the BBlocker if able;  His appetite is improved, wt up 7#, and back in the gym daily!    Past Medical History:  Diagnosis Date  . BiPAP (biphasic positive airway pressure) dependence    Pt denies history of OSA  . BPH (benign prostatic hyperplasia)   . Cancer ([place])    skin  . COPD (chronic obstructive pulmonary disease) ([place])   . Dysphagia   . Emphysema lung ([place])   . GERD (gastroesophageal reflux disease)    " Silent reflux"  . Hearing loss    right ear  . HLD (hyperlipidemia)   . Hypertension   . Oxygen dependent    2-3 liters  . Oxygen dependent   . Pneumonia   . Pulmonary hypertension (  [place])   Medical Hx>  HBP, ?nonobstructive CAD, HL, thyroid nodule, GERD, constipation, BPH, insomnia... Meds include>  CardizemCD240, Crestor10, Nexium20, Miralax, Proscar10, Restoril30...   Past Surgical History:  Procedure Laterality Date  . CARDIAC CATHETERIZATION    . CATARACT EXTRACTION W/ INTRAOCULAR LENS  IMPLANT, BILATERAL    . ESOPHAGOGASTRODUODENOSCOPY (EGD) WITH PROPOFOL N/A 08/23/2016   Procedure: ESOPHAGOGASTRODUODENOSCOPY (EGD) WITH PROPOFOL;   Surgeon: Wilford Corner, MD;  Location: [facility] ENDOSCOPY;  Service: Endoscopy;  Laterality: N/A;  . LUNG SURGERY    . TONSILLECTOMY    Hx Thoracotomy & RUL Bleb-ectomy 2007 in [place], [place]...   Outpatient Encounter Medications as of 11/04/2017  Medication Sig  . ADVAIR DISKUS 250-50 MCG/DOSE AEPB Inhale 1 puff into the lungs 2 (two) times daily.  Marland Kitchen albuterol (PROVENTIL HFA;VENTOLIN HFA) 108 (90 Base) MCG/ACT inhaler Inhale 2 puffs into the lungs every 6 (six) hours as needed for wheezing or shortness of breath.  . benzonatate (TESSALON) 100 MG capsule Take 1 capsule (100 mg total) 3 (three) times daily by mouth.  . diltiazem (CARTIA XT) 240 MG 24 hr capsule Take 240 mg by mouth at bedtime.  . feeding supplement (BOOST / RESOURCE BREEZE) LIQD Take 1 Container by mouth 3 (three) times daily between meals.  . finasteride (PROSCAR) 5 MG tablet Take 5 mg by mouth at bedtime.   . fluticasone (FLONASE) 50 MCG/ACT nasal spray Place 1 spray into the nose daily as needed.  Marland Kitchen guaiFENesin (MUCINEX) 600 MG 12 hr tablet Take 1 tablet (600 mg total) by mouth 2 (two) times daily.  . metoprolol tartrate (LOPRESSOR) 25 MG tablet Take 1 tablet (25 mg total) by mouth 2 (two) times daily.  . Multiple Vitamins-Minerals (MULTIVITAMIN WITH MINERALS) tablet Take 1 tablet by mouth daily.  Marland Kitchen omeprazole (PRILOSEC) 40 MG capsule Take 40 mg by mouth daily.  Marland Kitchen OVER THE COUNTER MEDICATION Apply 1 application See admin instructions topically. Diaper rash  . OXYGEN Inhale 2-3 L into the lungs continuous. 2L daytime, 3L at night  . polyethylene glycol (MIRALAX / GLYCOLAX) packet Take 17 g by mouth daily. (Patient taking differently: Take 17 g by mouth at bedtime. Mix in 8 oz liquid and drink)  . predniSONE (DELTASONE) 20 MG tablet Take as directed (Patient taking differently: Take 10 mg by mouth. Every other day.)  . rosuvastatin (CRESTOR) 10 MG tablet Take 10 mg by mouth daily.  . sildenafil (REVATIO) 20 MG tablet Take 1  tablet (20 mg total) by mouth 2 (two) times daily.  Marland Kitchen SPIRIVA HANDIHALER 18 MCG inhalation capsule INHALE THE CONTENTS OF 1 CAPSULE EVERY DAY  . tamsulosin (FLOMAX) 0.4 MG CAPS capsule Take 1 capsule (0.4 mg total) by mouth daily after breakfast. (Patient taking differently: Take 0.4 mg by mouth at bedtime. )  . temazepam (RESTORIL) 30 MG capsule Take 1 capsule (30 mg total) at bedtime by mouth.  . vitamin C (ASCORBIC ACID) 500 MG tablet Take 500 mg by mouth daily.  . [DISCONTINUED] apixaban (ELIQUIS) 5 MG TABS tablet Take 1 tablet (5 mg total) by mouth 2 (two) times daily.  . [DISCONTINUED] fluconazole (DIFLUCAN) 100 MG tablet Take 2 tablets now then 1 daily until gone  . [DISCONTINUED] ipratropium (ATROVENT) 0.02 % nebulizer solution Take 2.5 mLs (0.5 mg total) by nebulization every 6 (six) hours as needed for wheezing or shortness of breath.  Marland Kitchen ipratropium-albuterol (DUONEB) 0.5-2.5 (3) MG/3ML SOLN Take 3 mLs by nebulization 3 (three) times daily.  . predniSONE (DELTASONE) 5 MG  tablet Take one tab every other day.   No facility-administered encounter medications on file as of 11/04/2017.     No Known Allergies   Immunization History  Administered Date(s) Administered  . Influenza, High Dose Seasonal PF 07/22/2016, 08/18/2017  . Influenza-Unspecified 09/19/2015  . PPD Test 02/15/2016  . Pneumococcal Conjugate-13 09/19/2015    Current Medications, Allergies, Past Medical History, Past Surgical History, Family History, and Social History were reviewed in [employer] record.   Review of Systems             All symptoms NEG except where BOLDED >>  Constitutional:  F/C/S, fatigue, anorexia, unexpected weight change. HEENT:  HA, visual changes, hearing loss, earache, nasal symptoms, sore throat, mouth sores, hoarseness. Resp:  cough, sputum, hemoptysis; SOB, tightness, wheezing. Cardio:  CP, palpit, DOE, orthopnea, edema. GI:  N/V/D/C, blood in stool; reflux, abd  pain, distention, gas. GU:  dysuria, freq, urgency, hematuria, flank pain, voiding difficulty. MS:  joint pain, swelling, tenderness, decr ROM; neck pain, back pain, etc. Neuro:  HA, tremors, seizures, dizziness, syncope, weakness, numbness, gait abn. Skin:  suspicious lesions or skin rash. Heme:  adenopathy, bruising, bleeding. Psyche:  confusion, agitation, sleep disturbance, hallucinations, anxiety, depression suicidal.   Objective:   Physical Exam       Vital Signs:  Reviewed...   General:  WD, WN, 77 y/o WM in NAD; alert & oriented; pleasant & cooperative... HEENT:  [place]/AT; Conjunctiva- pink, Sclera- nonicteric, EOM-wnl, PERRLA, EACs-clear, TMs-wnl; NOSE-clear; THROAT-clear & wnl.  Neck:  Supple w/ fair ROM; no JVD; normal carotid impulses w/o bruits; no thyromegaly +small nodule palpated; no lymphadenopathy.  Chest:  Overinflated, resonant percussion note, decr BS bilat & can't augment BS voluntarily, no w/r/r heard... Heart:  Regular Rhythm; norm S1 & S2 without murmurs, rubs, or gallops detected. Abdomen:  Soft & nontender- no guarding or rebound; normal bowel sounds; no organomegaly or masses palpated. Ext:  decr ROM; without deformities or arthritic changes; no varicose veins, +venous insuffic, no edema;  Pulses intact w/o bruits. Neuro:  No focal neuro deficits; sensory testing normal; gait normal & balance OK. Derm:  No lesions noted; no rash etc. Lymph:  No cervical, supraclavicular, axillary, or inguinal adenopathy palpated.   Assessment:      IMP >>     Severe COPD- GOLD Stage3, bullous emphysema, s/p resection of RUL bleb in 2007> on Advair250Bid & Spiriva daily; he has NEB w/ Duoneb for prn use; Spirometry 09/2015 w/ FEV1=1.12 (37%); he has completed pulm rehab & now exercises at the gym 5d/wk;  He's had mult COPD exac => treat w/ Levaquin, Pred taper, continue Advair/ spiriva/ NEBS/ O2...    Chronic hypoxemic respiratory failure on O2 at 2L/min via POC> ambulatory  O2sats 09/2015 dropped to 89% on 2L/min after 2 Laps; he dropped to 85% after 1Lap on 3L/min POC 11/2016...    Hx prob hypercarbic resp failure inferred from Pt report of BiPAP ([facility]) since 2007, never been on CPAP, never had sleep study, unknown ABGs or pCO2 data> we never received data from his prev physicians.    Hx cor pulmonale/ pulmonary hypertension on Revatio20Tid since 2007 w/o additional med trials or dose adjustments> we do not have any of the objective data from his prev physician teams; 2DEcho here 09/2015 showed PAsys=36 and we tried to wean his Revatio but he refused due to "other reasons", finally compromised on Revatio20Bid...    Hx CAP- Hosp 12/2015 w/ RLL opac (nos) & responded  to broad spectrum Ab coverage + Sulomedrol, O2, Nebs, etc; readmitted 01/2016 w/ COPD exac- similar Rx but sent to NH for rehab at disch...    Hx of being on Home BiPAP since 2007 w/ eval & set up in [place]> we have been unable to obtain any old records regarding his initial studies and BiPAP set up...    Cardiac issues>  ?nonobstructive CAD, cor pulmonale w/ mild pulmHTN & 2DEcho 09/2015 showing norm LVF w/ EF=55-60%, norm wall motion, mild MR, mild RA dil, PAsys est 55mmHg...    Medical issues include:  HBP (on CardizemCD240), HL (on Cres10), hx thyroid nodule, GERD (on Nexium40), constipation, BPH (on Proscar5 & Flomax0.4) w/ hx urinary retention, insomnia (on Restoril30)...       NOTE>  He is Iron deficient & need further eval & Rx from his PCP & GI...  PLAN >>  10/02/15>   Andrew [name] has a distinctly patchy history to go along w/ his severe airflow obstruction & bullous emphysema;  We really need his old objective data from 2007 when he was started on O2 & BiPAP after his RUL bleb reduction surg;  He will try to get names and numbers for [place]- in the meanwhile we will contact his last physician in [facility] for their more recent data as we establish out data base here in [place];  He is very concerned that  he wants [place] to continue his current regimen which has served him well over the last 78yrs;  Continue Advair250Bid, Spiriva daily, Revatio20tid, O2 at 2L/min, and the BiPAP nightly as currently set...  11/01/15>  See prob list above- he is stable on this regimen but does not want to wean the Revatio further for "other" reasons; OK to continue current med regimen- advised regular exercise vs pulm rehab program; given ZPak for prn use over the winter & he knows to call for any resp issues, incr dyspnea, etc... He remains on BiPAP but we do not have any data- no records received from prev pulm physicians, no notes from [place], no download data from his machine- we will again try to make contact w/ his DME company... 03/18/16>   Andrew [name] has had a protracted resp [place] x2 this spring- triggered by prob RLL pneumonia (NOS) superimposed on his severe COPD/bullous emphysema;  Rec to change the Albut for Neb to [place] & continue treatments Tid; continue other meds regularly as outlined;  He is referred to [facility] & hopes to start soon;  We will plan ROV in 31mo sooner if needed. 06/20/16>   Andrew [name] is stable on his baseline regimen + pulm rehab, etc; rec to continue current meds and exercise; he needs the 2017 Flu vaccine when avail & will call prn any breathing problems; we plan ROV in 29mo...  11/25/16>   There is no such thing as a mild exac for Andrew [name]- even the mildest of URIs can cause a severe COPD exac- we discussed Rx w/ Levaquin500 x7d, Pred20mg - 5d taper (see AVS), + Diflucan100 per his request; hopefully he will respond but he has little in the way of reversible factors given his severe emphysema; reminded to go to the ER for [facility] admission if symptoms worsen despite therapy; as noted he is Fe defic & needs further eval per his PCP & GI- copies sent. => subseq Hosp for 4d w/ IV Solumedrol, NEBs/ O2/ etc... 05/01/17>   Andrew [name] has agreed to proceed w/ home sleep test to r/o OSA and we will further  assess w/ ABG on  RA to check for CO2 retention and any indication for BiPAP (doubt he will qualify at this time);  He will continue w/ his O2, Advair250, Spiriva, Duoneb vs ProventilHFA which he uses prn;  He does not appear to need the Revatio either but declines to discontinue this medication for other reasons; 07/31/17>   As explained above we will order an in-lab CPAP/BiPAP titration study, and proceed from there. 08/2017>   He was Hosp by [facility] w/ RLL pneumonia (nos) and ac on chr hypoxemic resp failure; disch on NEBS, Advair, Spiriva, Pred + Eliquis, Cardizem, Metoprolol for his PAF episode...  09/22/17>   We decided to continue his low dose Pred rx w/ 5mg  Qod;  Continue other meds as prev including NEBs w/ Duoneb Tid followed by Advair250Bid & Spiriva once daily, Mucinex 1-2Bid, Tessalon tid as needed... We plan rov recheck in 6 wks. 11/04/17>   We discussed slowly weaning the Pred down to 5mg  Qod & continue this til ROV in 45mo;  We will watch BP/ pulse state w/ an eye towards weaning the BBlocker if able;  His appetite is improved, wt up 7#, and back in the gym daily!    Plan:     Patient's Medications  New Prescriptions   IPRATROPIUM-ALBUTEROL (DUONEB) 0.5-2.5 (3) MG/3ML SOLN    Take 3 mLs by nebulization 3 (three) times daily.   PREDNISONE (DELTASONE) 5 MG TABLET    Take one tab every other day.  Previous Medications   ADVAIR DISKUS 250-50 MCG/DOSE AEPB    Inhale 1 puff into the lungs 2 (two) times daily.   ALBUTEROL (PROVENTIL HFA;VENTOLIN HFA) 108 (90 BASE) MCG/ACT INHALER    Inhale 2 puffs into the lungs every 6 (six) hours as needed for wheezing or shortness of breath.   BENZONATATE (TESSALON) 100 MG CAPSULE    Take 1 capsule (100 mg total) 3 (three) times daily by mouth.   DILTIAZEM (CARTIA XT) 240 MG 24 HR CAPSULE    Take 240 mg by mouth at bedtime.   FEEDING SUPPLEMENT (BOOST / RESOURCE BREEZE) LIQD    Take 1 Container by mouth 3 (three) times daily between meals.   FINASTERIDE (PROSCAR) 5 MG  TABLET    Take 5 mg by mouth at bedtime.    FLUTICASONE (FLONASE) 50 MCG/ACT NASAL SPRAY    Place 1 spray into the nose daily as needed.   GUAIFENESIN (MUCINEX) 600 MG 12 HR TABLET    Take 1 tablet (600 mg total) by mouth 2 (two) times daily.   METOPROLOL TARTRATE (LOPRESSOR) 25 MG TABLET    Take 1 tablet (25 mg total) by mouth 2 (two) times daily.   MULTIPLE VITAMINS-MINERALS (MULTIVITAMIN WITH MINERALS) TABLET    Take 1 tablet by mouth daily.   OMEPRAZOLE (PRILOSEC) 40 MG CAPSULE    Take 40 mg by mouth daily.   OVER THE COUNTER MEDICATION    Apply 1 application See admin instructions topically. Diaper rash   OXYGEN    Inhale 2-3 L into the lungs continuous. 2L daytime, 3L at night   POLYETHYLENE GLYCOL (MIRALAX / GLYCOLAX) PACKET    Take 17 g by mouth daily.   PREDNISONE (DELTASONE) 20 MG TABLET    Take as directed   ROSUVASTATIN (CRESTOR) 10 MG TABLET    Take 10 mg by mouth daily.   SILDENAFIL (REVATIO) 20 MG TABLET    Take 1 tablet (20 mg total) by mouth 2 (two) times daily.  SPIRIVA HANDIHALER 18 MCG INHALATION CAPSULE    INHALE THE CONTENTS OF 1 CAPSULE EVERY DAY   TAMSULOSIN (FLOMAX) 0.4 MG CAPS CAPSULE    Take 1 capsule (0.4 mg total) by mouth daily after breakfast.   TEMAZEPAM (RESTORIL) 30 MG CAPSULE    Take 1 capsule (30 mg total) at bedtime by mouth.   VITAMIN C (ASCORBIC ACID) 500 MG TABLET    Take 500 mg by mouth daily.  Modified Medications   Modified Medication Previous Medication   APIXABAN (ELIQUIS) 5 MG TABS TABLET apixaban (ELIQUIS) 5 MG TABS tablet      Take 1 tablet (5 mg total) by mouth 2 (two) times daily.    Take 1 tablet (5 mg total) by mouth 2 (two) times daily.  Discontinued Medications   FLUCONAZOLE (DIFLUCAN) 100 MG TABLET    Take 2 tablets now then 1 daily until gone   IPRATROPIUM (ATROVENT) 0.02 % NEBULIZER SOLUTION    Take 2.5 mLs (0.5 mg total) by nebulization every 6 (six) hours as needed for wheezing or shortness of breath.

## 2017-12-16 NOTE — Telephone Encounter (Signed)
Pt returning call. Cb is [phone].

## 2017-12-16 NOTE — Telephone Encounter (Signed)
Spoke with pt, but we got disconnected 3 times. I will try again later.    predniSONE (DELTASONE) 5 MG tablet [[id number]]  Order Details  Dose, Route, Frequency: As Directed   Dispense Quantity: 50 tablet Refills: 5 Fills remaining: --        Sig: Take two tabs every other day.       Written Date: 12/16/17 Expiration Date: 12/16/18    Start Date: 12/16/17 End Date: --         Ordering Provider:  Noralee Space, MD DEA #:  [id number] NPI:  [id number]   Authorizing Provider:  Noralee Space, MD DEA #:  [id number] NPI:

## 2017-12-16 NOTE — Patient Instructions (Addendum)
Today we updated your med list in our EPIC system...    Continue your current medications the same...  For the cough & yellow sputum>    We are prescribing DOXYCYCLINE antibiotic 100mg  tabs - take one tab twice daily til gone (10days)...  We decided to bump-up your PREDNISONE 5mg  tabs as follows>    Increase to 2 tabs= 10mg  daily for about 1 week...    Then plan to decrease slightly to 10mg  one day & 5mg  the next day for about 1 week (10, 5, 10, 5, etc)...    Then plan to decrease to 10mg  every other day & stay on this dose (10,0, 10, 0, etc)...  Gradually return to your prev activity level when able...  Call for any questions...  You have a follow up appt in 76mo (02/02/18- keep this appt.Marland KitchenMarland Kitchen

## 2017-12-16 NOTE — Telephone Encounter (Signed)
Called patient twice on office phone, did not receive a dial tone. Called patient from my cell phone, it went straight to VM. Will attempt to call back again later.

## 2017-12-17 MED ORDER — PREDNISONE 5 MG PO TABS: 10.0000 mg | ORAL_TABLET | ORAL | 1 refills | Status: DC

## 2017-12-17 NOTE — Telephone Encounter (Signed)
Spoke with pt. States that his prednisone prescription needs to be sent to [facility] for a 90 day supply. Rx has been sent in. Nothing further was needed.

## 2017-12-18 ENCOUNTER — Telehealth: Payer: Self-pay | Admitting: Pulmonary Disease

## 2017-12-18 NOTE — Telephone Encounter (Signed)
Called and spoke with pt and he is aware of the call from [place] was on 1/29 and this was about his prednisone that has already been taken care of.

## 2017-12-23 DIAGNOSIS — E7800 Pure hypercholesterolemia, unspecified: Secondary | ICD-10-CM | POA: Diagnosis not present

## 2017-12-23 DIAGNOSIS — D509 Iron deficiency anemia, unspecified: Secondary | ICD-10-CM | POA: Diagnosis not present

## 2017-12-23 DIAGNOSIS — I482 Chronic atrial fibrillation: Secondary | ICD-10-CM | POA: Diagnosis not present

## 2017-12-23 DIAGNOSIS — Z0001 Encounter for general adult medical examination with abnormal findings: Secondary | ICD-10-CM | POA: Diagnosis not present

## 2017-12-23 DIAGNOSIS — Z79899 Other long term (current) drug therapy: Secondary | ICD-10-CM | POA: Diagnosis not present

## 2017-12-23 DIAGNOSIS — J9611 Chronic respiratory failure with hypoxia: Secondary | ICD-10-CM | POA: Diagnosis not present

## 2017-12-23 DIAGNOSIS — E041 Nontoxic single thyroid nodule: Secondary | ICD-10-CM | POA: Diagnosis not present

## 2017-12-23 DIAGNOSIS — J439 Emphysema, unspecified: Secondary | ICD-10-CM | POA: Diagnosis not present

## 2017-12-23 DIAGNOSIS — Z23 Encounter for immunization: Secondary | ICD-10-CM | POA: Diagnosis not present

## 2017-12-23 DIAGNOSIS — K219 Gastro-esophageal reflux disease without esophagitis: Secondary | ICD-10-CM | POA: Diagnosis not present

## 2018-01-05 ENCOUNTER — Telehealth: Payer: Self-pay | Admitting: Pulmonary Disease

## 2018-01-05 NOTE — Telephone Encounter (Signed)
Patient has one pill for tonight left 01/05/18   SN comes back on 2.19.20 will route to [place] to follow up with once SN is back in the office.

## 2018-01-05 NOTE — Telephone Encounter (Signed)
When is Dr Lenna Gilford back? Can this wait till Dr Lenna Gilford comes back?  Dr. Brand Males, M.D., [facility].C.P Pulmonary and Critical Care Medicine Staff Physician, [place] Director - Interstitial Lung Disease  Program  Pulmonary [place] at [place], [place], [address]  Pager: [phone], If no answer or between  15:00h - 7:00h: call [phone] Telephone: [phone]

## 2018-01-05 NOTE — Telephone Encounter (Signed)
Patient called in and states that he takes Temazepam for sleep each night. He is normally prescribed 30 mg but his pharmacy will not have that strength until march. Patient is asking that we send in a one time prescription of 15 mg until he can get the 30 mg in March from his pharmacy. He was last seen by SN on 1.29.19 and the last prescription was sent in on 11.5.18 with 5 refills. MR please advise on this as SN is out of the office. Thanks.

## 2018-01-06 ENCOUNTER — Other Ambulatory Visit: Payer: Self-pay | Admitting: *Deleted

## 2018-01-06 DIAGNOSIS — J9611 Chronic respiratory failure with hypoxia: Secondary | ICD-10-CM

## 2018-01-06 DIAGNOSIS — J438 Other emphysema: Secondary | ICD-10-CM

## 2018-01-06 DIAGNOSIS — R0600 Dyspnea, unspecified: Secondary | ICD-10-CM

## 2018-01-06 DIAGNOSIS — I272 Pulmonary hypertension, unspecified: Secondary | ICD-10-CM

## 2018-01-06 DIAGNOSIS — Z8679 Personal history of other diseases of the circulatory system: Secondary | ICD-10-CM

## 2018-01-06 MED ORDER — TEMAZEPAM 15 MG PO CAPS: 15.0000 mg | ORAL_CAPSULE | Freq: Every evening | ORAL | 0 refills | Status: DC | PRN

## 2018-01-06 NOTE — Telephone Encounter (Signed)
Unable to reach patient, left message to give [place] a call back. Still waiting on SN response. He just got back into the office today.

## 2018-01-06 NOTE — Telephone Encounter (Signed)
Per SN Temazepam 15mg  1-2 as needed for sleep #60 no refills. Called into [facility] in [place].

## 2018-01-06 NOTE — Telephone Encounter (Signed)
Petr

## 2018-01-06 NOTE — Telephone Encounter (Signed)
Made patient aware that we are still waiting on SN approval. Will route to [place] to follow up on.

## 2018-01-06 NOTE — Telephone Encounter (Signed)
SN please advise if ok to prescribe 15mg  Temazepam until the 30mg  is available.  Thanks!

## 2018-01-06 NOTE — Telephone Encounter (Signed)
Pt is calling back [phone] 

## 2018-01-06 NOTE — Telephone Encounter (Signed)
Pt is returning call about this medication request. Per Pt he is out of this medication and needs it to go to sleep. Cb is [phone] [employer].

## 2018-01-07 ENCOUNTER — Ambulatory Visit: Payer: Medicare Other | Admitting: Pulmonary Disease

## 2018-01-07 NOTE — Telephone Encounter (Signed)
Pt is aware that Rx for Temazepam 15mg  has been sent to preferred pharmacy. Nothing further is needed.

## 2018-01-16 ENCOUNTER — Telehealth: Payer: Self-pay | Admitting: Pulmonary Disease

## 2018-01-16 MED ORDER — PREDNISONE 5 MG PO TABS: 10.0000 mg | ORAL_TABLET | Freq: Every day | ORAL | 1 refills | Status: DC

## 2018-01-16 NOTE — Telephone Encounter (Signed)
Pt aware of recs.  Nothing further needed. 

## 2018-01-16 NOTE — Telephone Encounter (Signed)
Spoke with pt, states that his respiratory s/s have worsened since dropping down to 10mg  prednisone qod on 2/11. In the past week, he has had increased fatigue, prod cough with lots of yellow mucus, sob with cough.  Denies fever, chest pain, body aches.  Requesting additional recs.   Uses [facility] in [place].    SN please advise on recs.  Thanks!

## 2018-01-16 NOTE — Telephone Encounter (Signed)
Per SN---  Ok to increase the prednisone back to 10 mg daily and add mucinex 1200 mg BID and increase fluids.  Thanks

## 2018-01-17 ENCOUNTER — Inpatient Hospital Stay (HOSPITAL_COMMUNITY)
Admission: EM | Admit: 2018-01-17 | Discharge: 2018-01-21 | DRG: 191 | Disposition: A | Discharge status: Home / Self Care | Payer: Medicare Other | Attending: Internal Medicine | Admitting: Internal Medicine

## 2018-01-17 ENCOUNTER — Encounter (HOSPITAL_COMMUNITY): Payer: Self-pay | Admitting: Emergency Medicine

## 2018-01-17 ENCOUNTER — Emergency Department (HOSPITAL_COMMUNITY): Payer: Medicare Other

## 2018-01-17 DIAGNOSIS — R69 Illness, unspecified: Secondary | ICD-10-CM | POA: Diagnosis not present

## 2018-01-17 DIAGNOSIS — Z9989 Dependence on other enabling machines and devices: Secondary | ICD-10-CM | POA: Diagnosis not present

## 2018-01-17 DIAGNOSIS — Z85828 Personal history of other malignant neoplasm of skin: Secondary | ICD-10-CM

## 2018-01-17 DIAGNOSIS — Z961 Presence of intraocular lens: Secondary | ICD-10-CM | POA: Diagnosis present

## 2018-01-17 DIAGNOSIS — J9611 Chronic respiratory failure with hypoxia: Secondary | ICD-10-CM | POA: Diagnosis not present

## 2018-01-17 DIAGNOSIS — I10 Essential (primary) hypertension: Secondary | ICD-10-CM | POA: Diagnosis present

## 2018-01-17 DIAGNOSIS — J988 Other specified respiratory disorders: Secondary | ICD-10-CM | POA: Diagnosis not present

## 2018-01-17 DIAGNOSIS — Z7951 Long term (current) use of inhaled steroids: Secondary | ICD-10-CM | POA: Diagnosis not present

## 2018-01-17 DIAGNOSIS — Z7952 Long term (current) use of systemic steroids: Secondary | ICD-10-CM

## 2018-01-17 DIAGNOSIS — J111 Influenza due to unidentified influenza virus with other respiratory manifestations: Secondary | ICD-10-CM | POA: Diagnosis present

## 2018-01-17 DIAGNOSIS — K219 Gastro-esophageal reflux disease without esophagitis: Secondary | ICD-10-CM | POA: Diagnosis present

## 2018-01-17 DIAGNOSIS — R1310 Dysphagia, unspecified: Secondary | ICD-10-CM | POA: Diagnosis present

## 2018-01-17 DIAGNOSIS — I272 Pulmonary hypertension, unspecified: Secondary | ICD-10-CM | POA: Diagnosis present

## 2018-01-17 DIAGNOSIS — R000 Tachycardia, unspecified: Secondary | ICD-10-CM | POA: Diagnosis not present

## 2018-01-17 DIAGNOSIS — Z9842 Cataract extraction status, left eye: Secondary | ICD-10-CM | POA: Diagnosis not present

## 2018-01-17 DIAGNOSIS — Z9981 Dependence on supplemental oxygen: Secondary | ICD-10-CM | POA: Diagnosis not present

## 2018-01-17 DIAGNOSIS — E785 Hyperlipidemia, unspecified: Secondary | ICD-10-CM | POA: Diagnosis present

## 2018-01-17 DIAGNOSIS — I480 Paroxysmal atrial fibrillation: Secondary | ICD-10-CM | POA: Diagnosis not present

## 2018-01-17 DIAGNOSIS — R05 Cough: Secondary | ICD-10-CM | POA: Diagnosis not present

## 2018-01-17 DIAGNOSIS — D649 Anemia, unspecified: Secondary | ICD-10-CM | POA: Diagnosis present

## 2018-01-17 DIAGNOSIS — N400 Enlarged prostate without lower urinary tract symptoms: Secondary | ICD-10-CM | POA: Diagnosis present

## 2018-01-17 DIAGNOSIS — J439 Emphysema, unspecified: Secondary | ICD-10-CM | POA: Diagnosis present

## 2018-01-17 DIAGNOSIS — L8990 Pressure ulcer of unspecified site, unspecified stage: Secondary | ICD-10-CM

## 2018-01-17 DIAGNOSIS — Z7901 Long term (current) use of anticoagulants: Secondary | ICD-10-CM

## 2018-01-17 DIAGNOSIS — J441 Chronic obstructive pulmonary disease with (acute) exacerbation: Principal | ICD-10-CM | POA: Diagnosis present

## 2018-01-17 DIAGNOSIS — Z79899 Other long term (current) drug therapy: Secondary | ICD-10-CM

## 2018-01-17 DIAGNOSIS — G4733 Obstructive sleep apnea (adult) (pediatric): Secondary | ICD-10-CM | POA: Diagnosis not present

## 2018-01-17 DIAGNOSIS — Z9841 Cataract extraction status, right eye: Secondary | ICD-10-CM | POA: Diagnosis not present

## 2018-01-17 DIAGNOSIS — Z87891 Personal history of nicotine dependence: Secondary | ICD-10-CM

## 2018-01-17 DIAGNOSIS — L89311 Pressure ulcer of right buttock, stage 1: Secondary | ICD-10-CM | POA: Diagnosis present

## 2018-01-17 DIAGNOSIS — R509 Fever, unspecified: Secondary | ICD-10-CM | POA: Diagnosis not present

## 2018-01-17 DIAGNOSIS — J069 Acute upper respiratory infection, unspecified: Secondary | ICD-10-CM | POA: Diagnosis not present

## 2018-01-17 LAB — COMPREHENSIVE METABOLIC PANEL
ALBUMIN: 3.9 g/dL (ref 3.5–5.0)
ALT: 14 U/L — ABNORMAL LOW (ref 17–63)
ANION GAP: 13 (ref 5–15)
AST: 16 U/L (ref 15–41)
Alkaline Phosphatase: 61 U/L (ref 38–126)
BUN: 14 mg/dL (ref 6–20)
CHLORIDE: 100 mmol/L — AB (ref 101–111)
CO2: 23 mmol/L (ref 22–32)
Calcium: 9 mg/dL (ref 8.9–10.3)
Creatinine, Ser: 1.03 mg/dL (ref 0.61–1.24)
GFR calc Af Amer: >60 mL/min (ref >60)
GFR calc non Af Amer: >60 mL/min (ref >60)
GLUCOSE: 126 mg/dL — AB (ref 65–99)
POTASSIUM: 3.8 mmol/L (ref 3.5–5.1)
SODIUM: 136 mmol/L (ref 135–145)
Total Bilirubin: 0.7 mg/dL (ref 0.3–1.2)
Total Protein: 6.4 g/dL — ABNORMAL LOW (ref 6.5–8.1)

## 2018-01-17 LAB — CBC WITH DIFFERENTIAL/PLATELET
BASOS ABS: 0 10*3/uL (ref 0.0–0.1)
Basophils Relative: 0 %
EOS PCT: 0 %
Eosinophils Absolute: 0 10*3/uL (ref 0.0–0.7)
HEMATOCRIT: 34.8 % — AB (ref 39.0–52.0)
Hemoglobin: 11.2 g/dL — ABNORMAL LOW (ref 13.0–17.0)
LYMPHS ABS: 1 10*3/uL (ref 0.7–4.0)
Lymphocytes Relative: 8 %
MCH: 29.9 pg (ref 26.0–34.0)
MCHC: 32.2 g/dL (ref 30.0–36.0)
MCV: 92.8 fL (ref 78.0–100.0)
MONO ABS: 0.8 10*3/uL (ref 0.1–1.0)
Monocytes Relative: 6 %
NEUTROS ABS: 10.7 10*3/uL — AB (ref 1.7–7.7)
Neutrophils Relative %: 86 %
PLATELETS: 199 10*3/uL (ref 150–400)
RBC: 3.75 MIL/uL — ABNORMAL LOW (ref 4.22–5.81)
RDW: 13.9 % (ref 11.5–15.5)
WBC: 12.5 10*3/uL — ABNORMAL HIGH (ref 4.0–10.5)

## 2018-01-17 LAB — I-STAT CG4 LACTIC ACID, ED: LACTIC ACID, VENOUS: 1.26 mmol/L (ref 0.5–1.9)

## 2018-01-17 MED ORDER — TAMSULOSIN HCL 0.4 MG PO CAPS
0.4000 mg | ORAL_CAPSULE | Freq: Every day | ORAL | Status: DC
  Administered 2018-01-18 – 2018-01-20 (×4): 0.4 mg via ORAL
  Filled 2018-01-17 (×4): qty 1

## 2018-01-17 MED ORDER — FINASTERIDE 5 MG PO TABS
5.0000 mg | ORAL_TABLET | Freq: Every day | ORAL | Status: DC
  Administered 2018-01-18 – 2018-01-20 (×4): 5 mg via ORAL
  Filled 2018-01-17 (×4): qty 1

## 2018-01-17 MED ORDER — OSELTAMIVIR PHOSPHATE 30 MG PO CAPS
30.0000 mg | ORAL_CAPSULE | Freq: Once | ORAL | Status: AC
  Administered 2018-01-18: 30 mg via ORAL
  Filled 2018-01-17: qty 1

## 2018-01-17 MED ORDER — IPRATROPIUM BROMIDE 0.02 % IN SOLN
0.5000 mg | Freq: Once | RESPIRATORY_TRACT | Status: AC
  Administered 2018-01-17: 0.5 mg via RESPIRATORY_TRACT
  Filled 2018-01-17: qty 2.5

## 2018-01-17 MED ORDER — SODIUM CHLORIDE 0.9% FLUSH
3.0000 mL | Freq: Two times a day (BID) | INTRAVENOUS | Status: DC
  Administered 2018-01-18 – 2018-01-21 (×6): 3 mL via INTRAVENOUS

## 2018-01-17 MED ORDER — METOPROLOL TARTRATE 25 MG PO TABS
25.0000 mg | ORAL_TABLET | Freq: Two times a day (BID) | ORAL | Status: DC
  Administered 2018-01-18 – 2018-01-21 (×7): 25 mg via ORAL
  Filled 2018-01-17 (×7): qty 1

## 2018-01-17 MED ORDER — ONDANSETRON HCL 4 MG PO TABS: 4.0000 mg | ORAL_TABLET | Freq: Four times a day (QID) | ORAL | Status: DC | PRN

## 2018-01-17 MED ORDER — ADULT MULTIVITAMIN W/MINERALS CH
1.0000 | ORAL_TABLET | Freq: Every day | ORAL | Status: DC
  Administered 2018-01-18 – 2018-01-21 (×4): 1 via ORAL
  Filled 2018-01-17 (×4): qty 1

## 2018-01-17 MED ORDER — MOMETASONE FURO-FORMOTEROL FUM 200-5 MCG/ACT IN AERO
2.0000 | INHALATION_SPRAY | Freq: Two times a day (BID) | RESPIRATORY_TRACT | Status: DC
  Administered 2018-01-19 – 2018-01-21 (×5): 2 via RESPIRATORY_TRACT
  Filled 2018-01-17 (×3): qty 8.8

## 2018-01-17 MED ORDER — SODIUM CHLORIDE 0.9 % IV SOLN
500.0000 mg | Freq: Once | INTRAVENOUS | Status: AC
  Administered 2018-01-17: 500 mg via INTRAVENOUS
  Filled 2018-01-17: qty 500

## 2018-01-17 MED ORDER — SODIUM CHLORIDE 0.9 % IV BOLUS (SEPSIS)
1000.0000 mL | Freq: Once | INTRAVENOUS | Status: AC
  Administered 2018-01-17: 1000 mL via INTRAVENOUS

## 2018-01-17 MED ORDER — FLUTICASONE PROPIONATE 50 MCG/ACT NA SUSP
1.0000 | Freq: Every day | NASAL | Status: DC
  Administered 2018-01-18 – 2018-01-21 (×3): 1 via NASAL
  Filled 2018-01-17 (×2): qty 16

## 2018-01-17 MED ORDER — DILTIAZEM HCL ER COATED BEADS 120 MG PO CP24
120.0000 mg | ORAL_CAPSULE | Freq: Every day | ORAL | Status: DC
  Administered 2018-01-18 – 2018-01-20 (×4): 120 mg via ORAL
  Filled 2018-01-17 (×4): qty 1

## 2018-01-17 MED ORDER — BENZONATATE 100 MG PO CAPS
100.0000 mg | ORAL_CAPSULE | Freq: Three times a day (TID) | ORAL | Status: DC
  Administered 2018-01-18 – 2018-01-21 (×11): 100 mg via ORAL
  Filled 2018-01-17 (×11): qty 1

## 2018-01-17 MED ORDER — ACETAMINOPHEN 325 MG PO TABS
650.0000 mg | ORAL_TABLET | Freq: Once | ORAL | Status: AC | PRN
  Administered 2018-01-17: 650 mg via ORAL
  Filled 2018-01-17: qty 2

## 2018-01-17 MED ORDER — METHYLPREDNISOLONE SODIUM SUCC 125 MG IJ SOLR
60.0000 mg | Freq: Four times a day (QID) | INTRAMUSCULAR | Status: DC
  Administered 2018-01-18 – 2018-01-20 (×10): 60 mg via INTRAVENOUS
  Filled 2018-01-17 (×10): qty 2

## 2018-01-17 MED ORDER — HYDROCODONE-ACETAMINOPHEN 5-325 MG PO TABS
1.0000 | ORAL_TABLET | ORAL | Status: DC | PRN
  Administered 2018-01-17 – 2018-01-19 (×3): 2 via ORAL
  Filled 2018-01-17 (×3): qty 2

## 2018-01-17 MED ORDER — PANTOPRAZOLE SODIUM 40 MG PO TBEC
40.0000 mg | DELAYED_RELEASE_TABLET | Freq: Every day | ORAL | Status: DC
  Administered 2018-01-18 – 2018-01-21 (×4): 40 mg via ORAL
  Filled 2018-01-17 (×4): qty 1

## 2018-01-17 MED ORDER — IPRATROPIUM-ALBUTEROL 0.5-2.5 (3) MG/3ML IN SOLN
3.0000 mL | Freq: Three times a day (TID) | RESPIRATORY_TRACT | Status: DC
  Administered 2018-01-18 – 2018-01-19 (×4): 3 mL via RESPIRATORY_TRACT
  Filled 2018-01-17 (×4): qty 3

## 2018-01-17 MED ORDER — POTASSIUM CHLORIDE IN NACL 20-0.9 MEQ/L-% IV SOLN
INTRAVENOUS | Status: DC
  Administered 2018-01-18: via INTRAVENOUS
  Filled 2018-01-17 (×2): qty 1000

## 2018-01-17 MED ORDER — ACETAMINOPHEN 650 MG RE SUPP: 650.0000 mg | Freq: Four times a day (QID) | RECTAL | Status: DC | PRN

## 2018-01-17 MED ORDER — SODIUM CHLORIDE 0.9 % IV SOLN
500.0000 mg | INTRAVENOUS | Status: DC
  Filled 2018-01-17: qty 500

## 2018-01-17 MED ORDER — APIXABAN 5 MG PO TABS
5.0000 mg | ORAL_TABLET | Freq: Two times a day (BID) | ORAL | Status: DC
  Administered 2018-01-18 – 2018-01-21 (×8): 5 mg via ORAL
  Filled 2018-01-17 (×9): qty 1

## 2018-01-17 MED ORDER — ACETAMINOPHEN 325 MG PO TABS: 650.0000 mg | ORAL_TABLET | Freq: Four times a day (QID) | ORAL | Status: DC | PRN

## 2018-01-17 MED ORDER — SODIUM CHLORIDE 0.9 % IV SOLN
1.0000 g | Freq: Once | INTRAVENOUS | Status: AC
  Administered 2018-01-17: 1 g via INTRAVENOUS
  Filled 2018-01-17: qty 10

## 2018-01-17 MED ORDER — KETOROLAC TROMETHAMINE 15 MG/ML IJ SOLN: 15.0000 mg | Freq: Four times a day (QID) | INTRAMUSCULAR | Status: DC | PRN

## 2018-01-17 MED ORDER — GUAIFENESIN ER 600 MG PO TB12
600.0000 mg | ORAL_TABLET | Freq: Two times a day (BID) | ORAL | Status: DC
  Administered 2018-01-18 – 2018-01-21 (×8): 600 mg via ORAL
  Filled 2018-01-17 (×8): qty 1

## 2018-01-17 MED ORDER — ONDANSETRON HCL 4 MG/2ML IJ SOLN: 4.0000 mg | Freq: Four times a day (QID) | INTRAMUSCULAR | Status: DC | PRN

## 2018-01-17 MED ORDER — ROSUVASTATIN CALCIUM 10 MG PO TABS
10.0000 mg | ORAL_TABLET | Freq: Every day | ORAL | Status: DC
  Administered 2018-01-18 – 2018-01-20 (×3): 10 mg via ORAL
  Filled 2018-01-17 (×3): qty 1

## 2018-01-17 MED ORDER — ALBUTEROL SULFATE (2.5 MG/3ML) 0.083% IN NEBU
5.0000 mg | INHALATION_SOLUTION | Freq: Once | RESPIRATORY_TRACT | Status: AC
  Administered 2018-01-17: 5 mg via RESPIRATORY_TRACT
  Filled 2018-01-17: qty 6

## 2018-01-17 MED ORDER — ALBUTEROL SULFATE (2.5 MG/3ML) 0.083% IN NEBU
2.5000 mg | INHALATION_SOLUTION | RESPIRATORY_TRACT | Status: DC | PRN
  Administered 2018-01-18: 2.5 mg via RESPIRATORY_TRACT
  Filled 2018-01-17: qty 3

## 2018-01-17 MED ORDER — POLYETHYLENE GLYCOL 3350 17 G PO PACK
17.0000 g | PACK | Freq: Every day | ORAL | Status: DC
  Administered 2018-01-18 – 2018-01-20 (×4): 17 g via ORAL
  Filled 2018-01-17 (×4): qty 1

## 2018-01-17 MED ORDER — BOOST / RESOURCE BREEZE PO LIQD CUSTOM
1.0000 | Freq: Three times a day (TID) | ORAL | Status: DC
  Administered 2018-01-18 – 2018-01-20 (×5): 1 via ORAL
  Filled 2018-01-17: qty 1

## 2018-01-17 MED ORDER — METHYLPREDNISOLONE SODIUM SUCC 125 MG IJ SOLR
125.0000 mg | Freq: Once | INTRAMUSCULAR | Status: AC
  Administered 2018-01-17: 125 mg via INTRAVENOUS
  Filled 2018-01-17: qty 2

## 2018-01-17 MED ORDER — TEMAZEPAM 15 MG PO CAPS
15.0000 mg | ORAL_CAPSULE | Freq: Every evening | ORAL | Status: DC | PRN
  Administered 2018-01-18 – 2018-01-20 (×3): 15 mg via ORAL
  Filled 2018-01-17 (×4): qty 1

## 2018-01-17 MED ORDER — SILDENAFIL CITRATE 20 MG PO TABS
20.0000 mg | ORAL_TABLET | Freq: Two times a day (BID) | ORAL | Status: DC
  Administered 2018-01-18 – 2018-01-21 (×8): 20 mg via ORAL
  Filled 2018-01-17 (×8): qty 1

## 2018-01-17 NOTE — H&P (Signed)
History and Physical    Andrew [name] DJM:[id number] DOB: July 10, 1941 DOA: 01/17/2018  PCP: Lujean Amel, MD   Patient coming from: Home  Chief Complaint: Fever, malaise, increased SOB, productive cough, wheezing, sick-contacts with flu   HPI: Andrew [name] is a 77 y.o. 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  Patient reports that he had been in his usual state of health until approximately 3 days ago when he noted worsening in his chronic dyspnea and cough.  Cough is been increasingly productive and his dyspnea has been worsening.  He called into his pulmonology clinic yesterday and was instructed to increase his prednisone.  Today he experienced continue worsening in his condition as well as development of high fevers.  He reports sick contacts with influenza.  Denies chest pain or leg swelling.  Recently on doxycycline.  ED Course: Upon arrival to the ED, patient is found to be febrile to 39.4 C, saturating low 90s on his usual supplemental oxygen, tachypneic, mildly tachycardic, and with stable blood pressure.  EKG features a sinus tachycardia with rate 101 and nonspecific ST-T abnormality that is similar to prior.  Chest x-ray is negative for acute cardiopulmonary disease.  Chemistry panel is unremarkable and CBC is notable for a leukocytosis and a chronic stable normocytic anemia.  Lactic acid is reassuring at 1.26.  Influenza PCR was sent but remains pending.  Patient was treated with 1 L of normal saline, acetaminophen, DuoNeb, Rocephin, and azithromycin in the ED.  He reports some improvement with these measures, but continues to have increased work of breathing while at rest and will be  admitted to the telemetry unit for ongoing evaluation and management of acute exacerbation in COPD, likely precipitated by viral illness.  Review of Systems:  All other systems reviewed and apart from HPI, are negative.  Past Medical History:  Diagnosis Date  . BiPAP (biphasic positive airway pressure) dependence    Pt denies history of OSA  . BPH (benign prostatic hyperplasia)   . Cancer ([place])    skin  . COPD (chronic obstructive pulmonary disease) ([place])   . Dysphagia   . Emphysema lung ([place])   . GERD (gastroesophageal reflux disease)    " Silent reflux"  . Hearing loss    right ear  . HLD (hyperlipidemia)   . Hypertension   . Oxygen dependent    2-3 liters  . Oxygen dependent   . Pneumonia   . Pulmonary hypertension ([place])     Past Surgical History:  Procedure Laterality Date  . CARDIAC CATHETERIZATION    . CATARACT EXTRACTION W/ INTRAOCULAR LENS  IMPLANT, BILATERAL    . ESOPHAGOGASTRODUODENOSCOPY (EGD) WITH PROPOFOL N/A 08/23/2016   Procedure: ESOPHAGOGASTRODUODENOSCOPY (EGD) WITH PROPOFOL;  Surgeon: Wilford Corner, MD;  Location: [facility] ENDOSCOPY;  Service: Endoscopy;  Laterality: N/A;  . LUNG SURGERY    . TONSILLECTOMY       reports that he quit smoking about 33 years ago. His smoking use included cigarettes. He smoked 0.00 packs per day for 0.00 years. he has never used smokeless tobacco. He reports that he does not drink alcohol or use drugs.  No Known Allergies  Family History  Problem Relation Age of Onset  . Cancer Maternal Uncle   . Pancreatitis Mother   . Bone cancer  Brother   . Stroke Maternal Grandfather   . Congestive Heart Failure Maternal Grandfather      Prior to Admission medications   Medication Sig Start Date End Date Taking? Authorizing Provider  ADVAIR DISKUS 250-50 MCG/DOSE AEPB Inhale 1 puff into the lungs 2 (two) times daily. 11/01/15   Noralee Space, MD  albuterol (PROVENTIL HFA;VENTOLIN HFA) 108 (90 Base) MCG/ACT inhaler Inhale 2 puffs  into the lungs every 6 (six) hours as needed for wheezing or shortness of breath. 04/17/17   Noralee Space, MD  apixaban (ELIQUIS) 5 MG TABS tablet Take 1 tablet (5 mg total) by mouth 2 (two) times daily. 11/19/17   Skeet Latch, MD  benzonatate (TESSALON) 100 MG capsule Take 1 capsule (100 mg total) 3 (three) times daily by mouth. 09/22/17   Noralee Space, MD  diltiazem (CARTIA XT) 240 MG 24 hr capsule Take 240 mg by mouth at bedtime.    [provider]  doxycycline (VIBRA-TABS) 100 MG tablet Take 1 tablet (100 mg total) by mouth 2 (two) times daily. 12/16/17   Noralee Space, MD  feeding supplement (BOOST / RESOURCE BREEZE) LIQD Take 1 Container by mouth 3 (three) times daily between meals. 09/12/17   Lavina Hamman, MD  finasteride (PROSCAR) 5 MG tablet Take 5 mg by mouth at bedtime.     [provider]  fluticasone (FLONASE) 50 MCG/ACT nasal spray Place 1 spray into the nose daily as needed. 06/24/17   [provider]  guaiFENesin (MUCINEX) 600 MG 12 hr tablet Take 1 tablet (600 mg total) by mouth 2 (two) times daily. 09/12/17   Lavina Hamman, MD  ipratropium-albuterol (DUONEB) 0.5-2.5 (3) MG/3ML SOLN Take 3 mLs by nebulization 3 (three) times daily. 11/04/17   Noralee Space, MD  metoprolol tartrate (LOPRESSOR) 25 MG tablet Take 1 tablet (25 mg total) by mouth 2 (two) times daily. 09/12/17   Lavina Hamman, MD  Multiple Vitamins-Minerals (MULTIVITAMIN WITH MINERALS) tablet Take 1 tablet by mouth daily.    [provider]  omeprazole (PRILOSEC) 40 MG capsule Take 40 mg by mouth daily.    [provider]  OVER THE COUNTER MEDICATION Apply 1 application See admin instructions topically. Diaper rash    [provider]  OXYGEN Inhale 2-3 L into the lungs continuous. 2L daytime, 3L at night    [provider]  polyethylene glycol (MIRALAX / GLYCOLAX) packet Take 17 g by mouth daily. Patient taking differently: Take 17 g by mouth at  bedtime. Mix in 8 oz liquid and drink 05/08/16   Mesner, Corene Cornea, MD  predniSONE (DELTASONE) 5 MG tablet Take 2 tablets (10 mg total) by mouth daily with breakfast. 01/16/18   Noralee Space, MD  rosuvastatin (CRESTOR) 10 MG tablet Take 10 mg by mouth daily.    [provider]  sildenafil (REVATIO) 20 MG tablet Take 1 tablet (20 mg total) by mouth 2 (two) times daily. 10/23/16   Noralee Space, MD  SPIRIVA HANDIHALER 18 MCG inhalation capsule INHALE THE CONTENTS OF 1 CAPSULE EVERY DAY 06/03/17   Noralee Space, MD  tamsulosin (FLOMAX) 0.4 MG CAPS capsule Take 1 capsule (0.4 mg total) by mouth daily after breakfast. Patient taking differently: Take 0.4 mg by mouth at bedtime.  01/03/16   Nita Sells, MD  temazepam (RESTORIL) 15 MG capsule Take 1 capsule (15 mg total) by mouth at bedtime as needed for sleep (1-2 tabs as needed). 01/06/18  Noralee Space, MD  temazepam (RESTORIL) 30 MG capsule Take 1 capsule (30 mg total) at bedtime by mouth. 09/22/17   Noralee Space, MD  vitamin C (ASCORBIC ACID) 500 MG tablet Take 500 mg by mouth daily.    [provider]    Physical Exam: Vitals:   01/17/18 1744 01/17/18 1900  BP: 115/76 104/70  Pulse: (!) 117 (!) 115  Resp: (!) 22 (!) 25  Temp: (!) 103 F (39.4 C)   TempSrc: Oral   SpO2: 94% 95%  Weight: 64.4 kg (142 lb)   Height: 5\' 7"  (1.702 m)       Constitutional: Tachypnea, mild dyspnea with speech, calm Eyes: PERTLA, lids and conjunctivae normal ENMT: Mucous membranes are moist. Posterior pharynx clear of any exudate or lesions.   Neck: normal, supple, no masses, no thyromegaly Respiratory: Breath sounds diminished bilaterally with prolonged expiratory phase and occasional end-expiratory wheeze. No pallor or cyanosis.  Cardiovascular: Rate ~110 and regular. No extremity edema. No significant JVD. Abdomen: No distension, no tenderness, no masses palpated. Bowel sounds normal.  Musculoskeletal: no clubbing / cyanosis. No  joint deformity upper and lower extremities.   Skin: no significant rashes, lesions, ulcers. Warm, dry, well-perfused. Neurologic: CN 2-12 grossly intact. Sensation intact. Strength 5/5 in all 4 limbs.  Psychiatric: Alert and oriented x 3. Pleasant and cooperative.     Labs on Admission: I have personally reviewed following labs and imaging studies  CBC: Recent Labs  Lab 01/17/18 1813  WBC 12.5*  NEUTROABS 10.7*  HGB 11.2*  HCT 34.8*  MCV 92.8  PLT 160   Basic Metabolic Panel: Recent Labs  Lab 01/17/18 1813  NA 136  K 3.8  CL 100*  CO2 23  GLUCOSE 126*  BUN 14  CREATININE 1.03  CALCIUM 9.0   GFR: Estimated Creatinine Clearance: 53.8 mL/min (by C-G formula based on SCr of 1.03 mg/dL). Liver Function Tests: Recent Labs  Lab 01/17/18 1813  AST 16  ALT 14*  ALKPHOS 61  BILITOT 0.7  PROT 6.4*  ALBUMIN 3.9   No results for input(s): LIPASE, AMYLASE in the last 168 hours. No results for input(s): AMMONIA in the last 168 hours. Coagulation Profile: No results for input(s): INR, PROTIME in the last 168 hours. Cardiac Enzymes: No results for input(s): CKTOTAL, CKMB, CKMBINDEX, TROPONINI in the last 168 hours. BNP (last 3 results) No results for input(s): PROBNP in the last 8760 hours. HbA1C: No results for input(s): HGBA1C in the last 72 hours. CBG: No results for input(s): GLUCAP in the last 168 hours. Lipid Profile: No results for input(s): CHOL, HDL, LDLCALC, TRIG, CHOLHDL, LDLDIRECT in the last 72 hours. Thyroid Function Tests: No results for input(s): TSH, T4TOTAL, FREET4, T3FREE, THYROIDAB in the last 72 hours. Anemia Panel: No results for input(s): VITAMINB12, FOLATE, FERRITIN, TIBC, IRON, RETICCTPCT in the last 72 hours. Urine analysis:    Component Value Date/Time   COLORURINE YELLOW 12/07/2016 2026   APPEARANCEUR CLEAR 12/07/2016 2026   LABSPEC 1.013 12/07/2016 2026   PHURINE 7.0 12/07/2016 2026   GLUCOSEU >=500 (A) 12/07/2016 2026   HGBUR  NEGATIVE 12/07/2016 2026   BILIRUBINUR NEGATIVE 12/07/2016 2026   KETONESUR NEGATIVE 12/07/2016 2026   PROTEINUR NEGATIVE 12/07/2016 2026   NITRITE NEGATIVE 12/07/2016 2026   LEUKOCYTESUR NEGATIVE 12/07/2016 2026   Sepsis Labs: @LABRCNTIP (procalcitonin:4,lacticidven:4) )No results found for this or any previous visit (from the past 240 hour(s)).   Radiological Exams on Admission: Dg Chest 2 View  Result Date:  01/17/2018 CLINICAL DATA:  Body aches, fever, productive cough EXAM: CHEST  2 VIEW COMPARISON:  09/22/2017 FINDINGS: Emphysematous changes with bullous changes in the right upper lobe. No pleural effusion or pneumothorax. The heart is normal in size. Visualized osseous structures are within normal limits. IMPRESSION: No evidence of acute cardiopulmonary disease. Electronically Signed   By: Julian Hy M.D.   On: 01/17/2018 18:24    EKG: Independently reviewed. Sinus tachycardia (rate 102), PAC, non-specific ST-T abnormality is similar to prior but more pronounced on current study.  Assessment/Plan   1. COPD with acute exacerbation; chronic hypoxic respiratory failure  - Presents with 3 days of progressive malaise with increased SOB, productive cough, and wheezing; became febrile today  - CXR is clear, no hypoxia on his usual FiO2, but increased WOB at rest  - Has been steroid-dependent, had prednisone increased yesterday after calling his pulm clinic  - Treated with DuoNeb, empiric Rocephin and azithromycin, and APAP in ED  - Likely precipitated by viral illness  - Culture blood and sputum, increase systemic steroid with IV Solu-Medrol, give empiric dose of Tamiflu pending flu pcr, continue ICS/LABA and scheduled DuoNeb, continue albuterol nebs prn, supportive care with APAP and gentle IVF hydration  2. Influenza-like illness  - Presents with gen aches, malaise, fevers, and productive cough - Reports contacts with influenza  - CXR clear  - Influenza PCR pending  - Given  empiric dose of Tamiflu with plans to continue if flu PCR positive    3. Paroxysmal atrial fibrillation  - In sinus tachycardia on admission  - CHADS-VASc (age x2, HTN)  - Continue Eliquis, diltiazem   4. Hypertension  - BP at goal  - Continue Lopressor as tolerated   5. Pulmonary hypertension  - Continue sildenafil   DVT prophylaxis: Eliquis  Code Status: Full  Family Communication: Discussed with patient Disposition Plan: Admit to telemetry  Consults called: None Admission status: Inpatient    Vianne Bulls, MD [facility] Pager [phone]  If 7PM-7AM, please contact night-coverage [email or web address] Password [facility]  01/17/2018, 8:15 PM

## 2018-01-17 NOTE — ED Provider Notes (Signed)
[facility] EMERGENCY DEPARTMENT Provider Note   CSN: [id number] Arrival date & time: 01/17/18  1738     History   Chief Complaint Chief Complaint  Patient presents with  . Fever    HPI Andrew [name] is a 77 y.o. male.  HPI  Is a 77 year old male history of HTN, HPL,  COPD on supplemental oxygen 2 L during the day 3 L at night presents the emergency department with 3 days of worsening dyspnea/shortness of breath with associated increased productive sputum along with fever to 103.  Patient admits to positive sick contacts family members with flu.  Past Medical History:  Diagnosis Date  . BiPAP (biphasic positive airway pressure) dependence    Pt denies history of OSA  . BPH (benign prostatic hyperplasia)   . Cancer ([place])    skin  . COPD (chronic obstructive pulmonary disease) ([place])   . Dysphagia   . Emphysema lung ([place])   . GERD (gastroesophageal reflux disease)    " Silent reflux"  . Hearing loss    right ear  . HLD (hyperlipidemia)   . Hypertension   . Oxygen dependent    2-3 liters  . Oxygen dependent   . Pneumonia   . Pulmonary hypertension [facility])     Patient Active Problem List   Diagnosis Date Noted  . Influenza-like illness 01/17/2018  . History of pneumonia 12/16/2017  . OSA on CPAP 09/22/2017  . OSA and COPD overlap syndrome ([place]) 09/11/2017  . Paroxysmal atrial fibrillation ([facility])   . Malnutrition of moderate degree 09/10/2017  . Sepsis ([place]) 09/09/2017  . Diarrhea 09/09/2017  . Protein-calorie malnutrition, severe 12/09/2016  . Dysphagia 08/23/2016  . Normocytic anemia 06/28/2016  . Pressure sore on buttocks 06/28/2016  . Pulmonary hypertension ([place]) 02/20/2016  . GERD (gastroesophageal reflux disease) 02/20/2016  . COPD with acute exacerbation ([place]) 02/13/2016  . Acute respiratory failure with hypoxia ([place]) 02/12/2016  . Hypertension 02/11/2016  . Hyperlipidemia 02/11/2016  . CAP (community acquired pneumonia)   . BPH (benign  prostatic hyperplasia) 12/29/2015  . Chronic respiratory failure with hypoxia ([place]) 12/28/2015  . 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 Chronic hypoxemic respiratory failure ([place]) 10/02/2015  . History of pulmonary hypertension 10/02/2015    Past Surgical History:  Procedure Laterality Date  . CARDIAC CATHETERIZATION    . CATARACT EXTRACTION W/ INTRAOCULAR LENS  IMPLANT, BILATERAL    . ESOPHAGOGASTRODUODENOSCOPY (EGD) WITH PROPOFOL N/A 08/23/2016   Procedure: ESOPHAGOGASTRODUODENOSCOPY (EGD) WITH PROPOFOL;  Surgeon: Wilford Corner, MD;  Location[facility] ENDOSCOPY;  Service: Endoscopy;  Laterality: N/A;  . LUNG SURGERY    . TONSILLECTOMY         Home Medications    Prior to Admission medications   Medication Sig Start Date End Date Taking? Authorizing Provider  ADVAIR DISKUS 250-50 MCG/DOSE AEPB Inhale 1 puff into the lungs 2 (two) times daily. 11/01/15   Noralee Space, MD  albuterol (PROVENTIL HFA;VENTOLIN HFA) 108 (90 Base) MCG/ACT inhaler Inhale 2 puffs into the lungs every 6 (six) hours as needed for wheezing or shortness of breath. 04/17/17   Noralee Space, MD  apixaban (ELIQUIS) 5 MG TABS tablet Take 1 tablet (5 mg total) by mouth 2 (two) times daily. 11/19/17   Skeet Latch, MD  benzonatate (TESSALON) 100 MG capsule Take 1 capsule (100 mg total) 3 (three) times daily by mouth. 09/22/17   Noralee Space, MD  diltiazem (CARTIA XT) 240 MG 24 hr capsule Take 240 mg by  mouth at bedtime.    [provider]  doxycycline (VIBRA-TABS) 100 MG tablet Take 1 tablet (100 mg total) by mouth 2 (two) times daily. 12/16/17   Noralee Space, MD  feeding supplement (BOOST / RESOURCE BREEZE) LIQD Take 1 Container by mouth 3 (three) times daily between meals. 09/12/17   Lavina Hamman, MD  finasteride (PROSCAR) 5 MG tablet Take 5 mg by mouth at bedtime.     [provider]  fluticasone (FLONASE) 50 MCG/ACT nasal spray Place 1 spray into the nose daily as needed. 06/24/17    [provider]  guaiFENesin (MUCINEX) 600 MG 12 hr tablet Take 1 tablet (600 mg total) by mouth 2 (two) times daily. 09/12/17   Lavina Hamman, MD  ipratropium-albuterol (DUONEB) 0.5-2.5 (3) MG/3ML SOLN Take 3 mLs by nebulization 3 (three) times daily. 11/04/17   Noralee Space, MD  metoprolol tartrate (LOPRESSOR) 25 MG tablet Take 1 tablet (25 mg total) by mouth 2 (two) times daily. 09/12/17   Lavina Hamman, MD  Multiple Vitamins-Minerals (MULTIVITAMIN WITH MINERALS) tablet Take 1 tablet by mouth daily.    [provider]  omeprazole (PRILOSEC) 40 MG capsule Take 40 mg by mouth daily.    [provider]  OVER THE COUNTER MEDICATION Apply 1 application See admin instructions topically. Diaper rash    [provider]  OXYGEN Inhale 2-3 L into the lungs continuous. 2L daytime, 3L at night    [provider]  polyethylene glycol (MIRALAX / GLYCOLAX) packet Take 17 g by mouth daily. Patient taking differently: Take 17 g by mouth at bedtime. Mix in 8 oz liquid and drink 05/08/16   Mesner, Corene Cornea, MD  predniSONE (DELTASONE) 5 MG tablet Take 2 tablets (10 mg total) by mouth daily with breakfast. 01/16/18   Noralee Space, MD  rosuvastatin (CRESTOR) 10 MG tablet Take 10 mg by mouth daily.    [provider]  sildenafil (REVATIO) 20 MG tablet Take 1 tablet (20 mg total) by mouth 2 (two) times daily. 10/23/16   Noralee Space, MD  SPIRIVA HANDIHALER 18 MCG inhalation capsule INHALE THE CONTENTS OF 1 CAPSULE EVERY DAY 06/03/17   Noralee Space, MD  tamsulosin (FLOMAX) 0.4 MG CAPS capsule Take 1 capsule (0.4 mg total) by mouth daily after breakfast. Patient taking differently: Take 0.4 mg by mouth at bedtime.  01/03/16   Nita Sells, MD  temazepam (RESTORIL) 15 MG capsule Take 1 capsule (15 mg total) by mouth at bedtime as needed for sleep (1-2 tabs as needed). 01/06/18   Noralee Space, MD  temazepam (RESTORIL) 30 MG capsule Take 1 capsule (30 mg  total) at bedtime by mouth. 09/22/17   Noralee Space, MD  vitamin C (ASCORBIC ACID) 500 MG tablet Take 500 mg by mouth daily.    [provider]    Family History Family History  Problem Relation Age of Onset  . Cancer Maternal Uncle   . Pancreatitis Mother   . Bone cancer Brother   . Stroke Maternal Grandfather   . Congestive Heart Failure Maternal Grandfather     Social History Social History   Tobacco Use  . Smoking status: Former Smoker    Packs/day: 0.00    Years: 0.00    Pack years: 0.00    Types: Cigarettes    Last attempt to quit: 10/01/1984    Years since quitting: 33.3  . Smokeless tobacco: Never Used  Substance Use Topics  .  Alcohol use: No    Alcohol/week: 0.0 oz  . Drug use: No     Allergies   Patient has no known allergies.   Review of Systems Review of Systems   Review of Systems  Constitutional: see HPI  HENT: Negative for ear pain, sore throat and trouble swallowing.   Eyes: Negative for pain and visual disturbance.  Respiratory: see HPI   Cardiovascular: Negative for chest pain and leg swelling.  Gastrointestinal: Negative for nausea, vomiting, abdominal pain and diarrhea.  Genitourinary: Negative for dysuria, urgency and frequency.  Musculoskeletal: Negative for back pain and joint swelling.  Skin: Negative for rash and wound.  Neurological: Negative for dizziness, syncope, speech difficulty, weakness and numbness.    Physical Exam Updated Vital Signs BP 130/65   Pulse 95   Temp 99.6 F (37.6 C) (Oral)   Resp (!) 21   Ht 5\' 7"  (1.702 m)   Wt 64.4 kg (142 lb)   SpO2 93%   BMI 22.24 kg/m   Physical Exam  Physical Exam Vitals:   01/17/18 2030 01/17/18 2034  BP: 130/65   Pulse: 95   Resp: (!) 21   Temp:  99.6 F (37.6 C)  SpO2: 93%    Constitutional: Patient is in no acute distress Head: Normocephalic and atraumatic.  Eyes: Extraocular motion intact, no scleral icterus Neck: Supple without meningismus, mass, or  overt JVD Respiratory: diminished/slight crackles bilateral;  No respiratory distress. CV: Tachycardia, no obvious murmurs.  Pulses +2 and symmetric Abdomen: Soft, non-tender, non-distended MSK: Extremities are atraumatic without deformity, ROM intact Skin: Warm, dry, intact Neuro: Alert and oriented, no motor deficit noted Psychiatric: Mood and affect are normal.  ED Treatments / Results  Labs (all labs ordered are listed, but only abnormal results are displayed) Labs Reviewed  COMPREHENSIVE METABOLIC PANEL - Abnormal; Notable for the following components:      Result Value   Chloride 100 (*)    Glucose, Bld 126 (*)    Total Protein 6.4 (*)    ALT 14 (*)    All other components within normal limits  CBC WITH DIFFERENTIAL/PLATELET - Abnormal; Notable for the following components:   WBC 12.5 (*)    RBC 3.75 (*)    Hemoglobin 11.2 (*)    HCT 34.8 (*)    Neutro Abs 10.7 (*)    All other components within normal limits  CULTURE, BLOOD (ROUTINE X 2)  CULTURE, BLOOD (ROUTINE X 2)  CULTURE, EXPECTORATED SPUTUM-ASSESSMENT  GRAM STAIN  INFLUENZA PANEL BY PCR (TYPE A & B)  STREP PNEUMONIAE URINARY ANTIGEN  BASIC METABOLIC PANEL  CBC  LEGIONELLA PNEUMOPHILA SEROGP 1 UR AG  I-STAT CG4 LACTIC ACID, ED    EKG  EKG Interpretation  Date/Time:  Saturday January 17 2018 20:00:24 EST Ventricular Rate:  102 PR Interval:    QRS Duration: 103 QT Interval:  317 QTC Calculation: 413 R Axis:   21 Text Interpretation:  Sinus tachycardia Atrial premature complex Non-specific ST-t changes Confirmed by Lajean Saver [id number]) on 01/17/2018 8:02:19 PM       Radiology Dg Chest 2 View  Result Date: 01/17/2018 CLINICAL DATA:  Body aches, fever, productive cough EXAM: CHEST  2 VIEW COMPARISON:  09/22/2017 FINDINGS: Emphysematous changes with bullous changes in the right upper lobe. No pleural effusion or pneumothorax. The heart is normal in size. Visualized osseous structures are within normal  limits. IMPRESSION: No evidence of acute cardiopulmonary disease. Electronically Signed   By: Henderson Newcomer.D.  On: 01/17/2018 18:24    Procedures Procedures (including critical care time)  Medications Ordered in ED Medications  azithromycin (ZITHROMAX) 500 mg in sodium chloride 0.9 % 250 mL IVPB (500 mg Intravenous New Bag/Given 01/17/18 2007)  mometasone-formoterol (DULERA) 200-5 MCG/ACT inhaler 2 puff (not administered)  apixaban (ELIQUIS) tablet 5 mg (not administered)  benzonatate (TESSALON) capsule 100 mg (not administered)  diltiazem (CARDIZEM CD) 24 hr capsule 240 mg (not administered)  feeding supplement (BOOST / RESOURCE BREEZE) liquid 1 Container (not administered)  finasteride (PROSCAR) tablet 5 mg (not administered)  fluticasone (FLONASE) 50 MCG/ACT nasal spray 1 spray (not administered)  guaiFENesin (MUCINEX) 12 hr tablet 600 mg (not administered)  ipratropium-albuterol (DUONEB) 0.5-2.5 (3) MG/3ML nebulizer solution 3 mL (not administered)  metoprolol tartrate (LOPRESSOR) tablet 25 mg (not administered)  multivitamin with minerals tablet 1 tablet (not administered)  pantoprazole (PROTONIX) EC tablet 40 mg (not administered)  polyethylene glycol (MIRALAX / GLYCOLAX) packet 17 g (not administered)  rosuvastatin (CRESTOR) tablet 10 mg (not administered)  sildenafil (REVATIO) tablet 20 mg (not administered)  tamsulosin (FLOMAX) capsule 0.4 mg (not administered)  temazepam (RESTORIL) capsule 15 mg (not administered)  sodium chloride flush (NS) 0.9 % injection 3 mL (not administered)  0.9 % NaCl with KCl 20 mEq/ L  infusion (not administered)  acetaminophen (TYLENOL) tablet 650 mg (not administered)    Or  acetaminophen (TYLENOL) suppository 650 mg (not administered)  HYDROcodone-acetaminophen (NORCO/VICODIN) 5-325 MG per tablet 1-2 tablet (not administered)  ketorolac (TORADOL) 15 MG/ML injection 15 mg (not administered)  ondansetron (ZOFRAN) tablet 4 mg (not  administered)    Or  ondansetron (ZOFRAN) injection 4 mg (not administered)  azithromycin (ZITHROMAX) 500 mg in sodium chloride 0.9 % 250 mL IVPB (not administered)  oseltamivir (TAMIFLU) capsule 30 mg (not administered)  albuterol (PROVENTIL) (2.5 MG/3ML) 0.083% nebulizer solution 2.5 mg (not administered)  methylPREDNISolone sodium succinate (SOLU-MEDROL) 125 mg/2 mL injection 125 mg (not administered)  methylPREDNISolone sodium succinate (SOLU-MEDROL) 125 mg/2 mL injection 60 mg (not administered)  acetaminophen (TYLENOL) tablet 650 mg (650 mg Oral Given 01/17/18 1758)  albuterol (PROVENTIL) (2.5 MG/3ML) 0.083% nebulizer solution 5 mg (5 mg Nebulization Given 01/17/18 1926)  ipratropium (ATROVENT) nebulizer solution 0.5 mg (0.5 mg Nebulization Given 01/17/18 1926)  cefTRIAXone (ROCEPHIN) 1 g in sodium chloride 0.9 % 100 mL IVPB (0 g Intravenous Stopped 01/17/18 1956)  sodium chloride 0.9 % bolus 1,000 mL (1,000 mLs Intravenous New Bag/Given 01/17/18 1951)     Initial Impression / Assessment and Plan / ED Course  I have reviewed the triage vital signs and the nursing notes.  Pertinent labs & imaging results that were available during my care of the patient were reviewed by me and considered in my medical decision making (see chart for details).     Is a 77 year old male history of COPD on supplemental oxygen 2 L during the day 3 L at night presents the emergency department with 3 days of worsening dyspnea/shortness of breath with associated increased productive sputum along with fever to 103.  Patient admits to positive sick contacts family members with flu.  Upon arrival patient is tachycardic febrile normotensive slight increased work of breathing on 3 L nasal cannula with leukocytosis elevated at 12.5 remainder labs unremarkable.  Chest x-ray no discrete pneumonia.  Blood cultures pending patient started empirically on azithromycin/Rocephin.  Influenza lab pending.  Pt with extremes of age,  multiple comorbidities, baseline supplemental oxygen with findings concerning for early sepsis.  Patient will benefit from  empiric treatment antibiotics/observation admission hospitalist group.      Final Clinical Impressions(s) / ED Diagnoses   Final diagnoses:  Respiratory infection    ED Discharge Orders    None       Willette Alma, DO 01/17/18 2039    Lajean Saver, MD 01/17/18 2302

## 2018-01-17 NOTE — ED Provider Notes (Signed)
[facility] EMERGENCY DEPARTMENT Provider Note   CSN: [id number] Arrival date & time: 01/17/18  1738     History   Chief Complaint Chief Complaint  Patient presents with  . Fever    HPI Jayr Lupercio is a 77 y.o. male.  Patient w hx copd, c/o fever to 103 and productive cough, for the past 1 day. Symptoms acute onset, persistent, moderate, felt worse this afternoon. Denies sore throat or runny nose. +body aches. No neck pain or stiffness. No chest pain. +wheezing, mild sob, hx copd. Denies abd pain. No vomiting or diarrhea. No dysuria or gu c/o. No known ill contacts.    The history is provided by the patient.  Fever   Associated symptoms include cough. Pertinent negatives include no chest pain, no headaches and no sore throat.    Past Medical History:  Diagnosis Date  . BiPAP (biphasic positive airway pressure) dependence    Pt denies history of OSA  . BPH (benign prostatic hyperplasia)   . Cancer ([place])    skin  . COPD (chronic obstructive pulmonary disease) ([place])   . Dysphagia   . Emphysema lung ([place])   . GERD (gastroesophageal reflux disease)    " Silent reflux"  . Hearing loss    right ear  . HLD (hyperlipidemia)   . Hypertension   . Oxygen dependent    2-3 liters  . Oxygen dependent   . Pneumonia   . Pulmonary hypertension [facility])     Patient Active Problem List   Diagnosis Date Noted  . History of pneumonia 12/16/2017  . OSA on CPAP 09/22/2017  . OSA and COPD overlap syndrome ([place]) 09/11/2017  . Paroxysmal atrial fibrillation (HCC)   . Malnutrition of moderate degree 09/10/2017  . Sepsis ([place]) 09/09/2017  . Diarrhea 09/09/2017  . Protein-calorie malnutrition, severe 12/09/2016  . Dysphagia 08/23/2016  . Normocytic anemia 06/28/2016  . Pressure sore on buttocks 06/28/2016  . Pulmonary hypertension ([place]) 02/20/2016  . GERD (gastroesophageal reflux disease) 02/20/2016  . COPD exacerbation ([place]) 02/13/2016  . Acute respiratory failure  with hypoxia ([place]) 02/12/2016  . Hypertension 02/11/2016  . Hyperlipidemia 02/11/2016  . CAP (community acquired pneumonia)   . BPH (benign prostatic hyperplasia) 12/29/2015  . Acute on chronic respiratory failure with hypoxia ([place]) 12/28/2015  . COPD with emphysema ([place]) 10/02/2015  . Chronic hypoxemic respiratory failure ([place]) 10/02/2015  . History of pulmonary hypertension 10/02/2015    Past Surgical History:  Procedure Laterality Date  . CARDIAC CATHETERIZATION    . CATARACT EXTRACTION W/ INTRAOCULAR LENS  IMPLANT, BILATERAL    . ESOPHAGOGASTRODUODENOSCOPY (EGD) WITH PROPOFOL N/A 08/23/2016   Procedure: ESOPHAGOGASTRODUODENOSCOPY (EGD) WITH PROPOFOL;  Surgeon: Wilford Corner, MD;  Location: [facility] [place] ENDOSCOPY;  Service: Endoscopy;  Laterality: N/A;  . LUNG SURGERY    . TONSILLECTOMY         Home Medications    Prior to Admission medications   Medication Sig Start Date End Date Taking? Authorizing Provider  ADVAIR DISKUS 250-50 MCG/DOSE AEPB Inhale 1 puff into the lungs 2 (two) times daily. 11/01/15   Noralee Space, MD  albuterol (PROVENTIL HFA;VENTOLIN HFA) 108 (90 Base) MCG/ACT inhaler Inhale 2 puffs into the lungs every 6 (six) hours as needed for wheezing or shortness of breath. 04/17/17   Noralee Space, MD  apixaban (ELIQUIS) 5 MG TABS tablet Take 1 tablet (5 mg total) by mouth 2 (two) times daily. 11/19/17   Skeet Latch, MD  benzonatate (TESSALON) 100 MG capsule  Take 1 capsule (100 mg total) 3 (three) times daily by mouth. 09/22/17   Noralee Space, MD  diltiazem (CARTIA XT) 240 MG 24 hr capsule Take 240 mg by mouth at bedtime.    [provider]  doxycycline (VIBRA-TABS) 100 MG tablet Take 1 tablet (100 mg total) by mouth 2 (two) times daily. 12/16/17   Noralee Space, MD  feeding supplement (BOOST / RESOURCE BREEZE) LIQD Take 1 Container by mouth 3 (three) times daily between meals. 09/12/17   Lavina Hamman, MD  finasteride (PROSCAR) 5 MG tablet Take 5 mg by  mouth at bedtime.     [provider]  fluticasone (FLONASE) 50 MCG/ACT nasal spray Place 1 spray into the nose daily as needed. 06/24/17   [provider]  guaiFENesin (MUCINEX) 600 MG 12 hr tablet Take 1 tablet (600 mg total) by mouth 2 (two) times daily. 09/12/17   Lavina Hamman, MD  ipratropium-albuterol (DUONEB) 0.5-2.5 (3) MG/3ML SOLN Take 3 mLs by nebulization 3 (three) times daily. 11/04/17   Noralee Space, MD  metoprolol tartrate (LOPRESSOR) 25 MG tablet Take 1 tablet (25 mg total) by mouth 2 (two) times daily. 09/12/17   Lavina Hamman, MD  Multiple Vitamins-Minerals (MULTIVITAMIN WITH MINERALS) tablet Take 1 tablet by mouth daily.    [provider]  omeprazole (PRILOSEC) 40 MG capsule Take 40 mg by mouth daily.    [provider]  OVER THE COUNTER MEDICATION Apply 1 application See admin instructions topically. Diaper rash    [provider]  OXYGEN Inhale 2-3 L into the lungs continuous. 2L daytime, 3L at night    [provider]  polyethylene glycol (MIRALAX / GLYCOLAX) packet Take 17 g by mouth daily. Patient taking differently: Take 17 g by mouth at bedtime. Mix in 8 oz liquid and drink 05/08/16   Mesner, Corene Cornea, MD  predniSONE (DELTASONE) 5 MG tablet Take 2 tablets (10 mg total) by mouth daily with breakfast. 01/16/18   Noralee Space, MD  rosuvastatin (CRESTOR) 10 MG tablet Take 10 mg by mouth daily.    [provider]  sildenafil (REVATIO) 20 MG tablet Take 1 tablet (20 mg total) by mouth 2 (two) times daily. 10/23/16   Noralee Space, MD  SPIRIVA HANDIHALER 18 MCG inhalation capsule INHALE THE CONTENTS OF 1 CAPSULE EVERY DAY 06/03/17   Noralee Space, MD  tamsulosin (FLOMAX) 0.4 MG CAPS capsule Take 1 capsule (0.4 mg total) by mouth daily after breakfast. Patient taking differently: Take 0.4 mg by mouth at bedtime.  01/03/16   Nita Sells, MD  temazepam (RESTORIL) 15 MG capsule Take 1 capsule (15 mg total) by  mouth at bedtime as needed for sleep (1-2 tabs as needed). 01/06/18   Noralee Space, MD  temazepam (RESTORIL) 30 MG capsule Take 1 capsule (30 mg total) at bedtime by mouth. 09/22/17   Noralee Space, MD  vitamin C (ASCORBIC ACID) 500 MG tablet Take 500 mg by mouth daily.    [provider]    Family History Family History  Problem Relation Age of Onset  . Cancer Maternal Uncle   . Pancreatitis Mother   . Bone cancer Brother   . Stroke Maternal Grandfather   . Congestive Heart Failure Maternal Grandfather     Social History Social History   Tobacco Use  . Smoking status: Former Smoker    Packs/day: 0.00    Years: 0.00    Pack years: 0.00  Types: Cigarettes    Last attempt to quit: 10/01/1984    Years since quitting: 33.3  . Smokeless tobacco: Never Used  Substance Use Topics  . Alcohol use: No    Alcohol/week: 0.0 oz  . Drug use: No     Allergies   Patient has no known allergies.   Review of Systems Review of Systems  Constitutional: Positive for fever.  HENT: Negative for sore throat.   Eyes: Negative for redness.  Respiratory: Positive for cough and wheezing. Negative for shortness of breath.   Cardiovascular: Negative for chest pain.  Gastrointestinal: Negative for abdominal pain.  Genitourinary: Negative for dysuria and flank pain.  Musculoskeletal: Negative for neck pain and neck stiffness.  Skin: Negative for rash.  Neurological: Negative for headaches.  Hematological: Does not bruise/bleed easily.  Psychiatric/Behavioral: Negative for confusion.     Physical Exam Updated Vital Signs BP 115/76 (BP Location: Right Arm)   Pulse (!) 117   Temp (!) 103 F (39.4 C) (Oral)   Resp (!) 22   Ht 1.702 m (5\' 7" )   Wt 64.4 kg (142 lb)   SpO2 94%   BMI 22.24 kg/m   Physical Exam  Constitutional: He appears well-developed and well-nourished. No distress.  HENT:  Head: Atraumatic.  Mouth/Throat: Oropharynx is clear and moist.  Eyes:  Conjunctivae are normal. No scleral icterus.  Neck: Neck supple. No tracheal deviation present.  No stiffness or rigidity  Cardiovascular: Normal rate, regular rhythm, normal heart sounds and intact distal pulses. Exam reveals no gallop and no friction rub.  No murmur heard. Pulmonary/Chest: Effort normal. No accessory muscle usage. No respiratory distress. He has wheezes.  Mild wheezing. Few rhonchi left.   Abdominal: Soft. Bowel sounds are normal. He exhibits no distension. There is no tenderness.  Genitourinary:  Genitourinary Comments: No cva tenderness  Musculoskeletal: He exhibits no edema or tenderness.  Neurological: He is alert.  Skin: Skin is warm and dry. No rash noted. He is not diaphoretic.  Psychiatric: He has a normal mood and affect.  Nursing note and vitals reviewed.    ED Treatments / Results  Labs (all labs ordered are listed, but only abnormal results are displayed) Results for orders placed or performed during the hospital encounter of 01/17/18  CBC with Differential  Result Value Ref Range   WBC 12.5 (H) 4.0 - 10.5 K/uL   RBC 3.75 (L) 4.22 - 5.81 MIL/uL   Hemoglobin 11.2 (L) 13.0 - 17.0 g/dL   HCT 34.8 (L) 39.0 - 52.0 %   MCV 92.8 78.0 - 100.0 fL   MCH 29.9 26.0 - 34.0 pg   MCHC 32.2 30.0 - 36.0 g/dL   RDW 13.9 11.5 - 15.5 %   Platelets 199 150 - 400 K/uL   Neutrophils Relative % 86 %   Neutro Abs 10.7 (H) 1.7 - 7.7 K/uL   Lymphocytes Relative 8 %   Lymphs Abs 1.0 0.7 - 4.0 K/uL   Monocytes Relative 6 %   Monocytes Absolute 0.8 0.1 - 1.0 K/uL   Eosinophils Relative 0 %   Eosinophils Absolute 0.0 0.0 - 0.7 K/uL   Basophils Relative 0 %   Basophils Absolute 0.0 0.0 - 0.1 K/uL  I-Stat CG4 Lactic Acid, ED  Result Value Ref Range   Lactic Acid, Venous 1.26 0.5 - 1.9 mmol/L   Dg Chest 2 View  Result Date: 01/17/2018 CLINICAL DATA:  Body aches, fever, productive cough EXAM: CHEST  2 VIEW COMPARISON:  09/22/2017 FINDINGS: Emphysematous  changes with  bullous changes in the right upper lobe. No pleural effusion or pneumothorax. The heart is normal in size. Visualized osseous structures are within normal limits. IMPRESSION: No evidence of acute cardiopulmonary disease. Electronically Signed   By: Julian Hy M.D.   On: 01/17/2018 18:24    EKG  EKG Interpretation None       Radiology Dg Chest 2 View  Result Date: 01/17/2018 CLINICAL DATA:  Body aches, fever, productive cough EXAM: CHEST  2 VIEW COMPARISON:  09/22/2017 FINDINGS: Emphysematous changes with bullous changes in the right upper lobe. No pleural effusion or pneumothorax. The heart is normal in size. Visualized osseous structures are within normal limits. IMPRESSION: No evidence of acute cardiopulmonary disease. Electronically Signed   By: Julian Hy M.D.   On: 01/17/2018 18:24    Procedures Procedures (including critical care time)  Medications Ordered in ED Medications  albuterol (PROVENTIL) (2.5 MG/3ML) 0.083% nebulizer solution 5 mg (not administered)  ipratropium (ATROVENT) nebulizer solution 0.5 mg (not administered)  cefTRIAXone (ROCEPHIN) 1 g in sodium chloride 0.9 % 100 mL IVPB (not administered)  azithromycin (ZITHROMAX) 500 mg in sodium chloride 0.9 % 250 mL IVPB (not administered)  acetaminophen (TYLENOL) tablet 650 mg (650 mg Oral Given 01/17/18 1758)     Initial Impression / Assessment and Plan / ED Course  I have reviewed the triage vital signs and the nursing notes.  Pertinent labs & imaging results that were available during my care of the patient were reviewed by me and considered in my medical decision making (see chart for details).  cxr and labs.  Albuterol and atrovent neb.  Former smoker, not current. Hx copd. Uses mdi prn.   Prod cough, congestion on exam, febrile, elev wbc - will rx resp infxn, possible pna.     Final Clinical Impressions(s) / ED Diagnoses   Final diagnoses:  None    ED Discharge Orders    None         Lajean Saver, MD 01/18/18 1457

## 2018-01-17 NOTE — ED Triage Notes (Signed)
Patient presents to ED for assessment of generalized body aches, fever, productive cough and malaise.  Denies known exposure to virus.  Hx of COPD with increased work of breathing.  Denies additional use of inhalers/meds today.

## 2018-01-17 NOTE — ED Notes (Signed)
Pt is resting in bed. NAD Noted. Hospital bed ordered for patinet

## 2018-01-18 ENCOUNTER — Other Ambulatory Visit: Payer: Self-pay

## 2018-01-18 DIAGNOSIS — L8990 Pressure ulcer of unspecified site, unspecified stage: Secondary | ICD-10-CM

## 2018-01-18 LAB — CBC
HEMATOCRIT: 32 % — AB (ref 39.0–52.0)
Hemoglobin: 10.2 g/dL — ABNORMAL LOW (ref 13.0–17.0)
MCH: 29.7 pg (ref 26.0–34.0)
MCHC: 31.9 g/dL (ref 30.0–36.0)
MCV: 93 fL (ref 78.0–100.0)
Platelets: 195 10*3/uL (ref 150–400)
RBC: 3.44 MIL/uL — ABNORMAL LOW (ref 4.22–5.81)
RDW: 14.1 % (ref 11.5–15.5)
WBC: 14.5 10*3/uL — ABNORMAL HIGH (ref 4.0–10.5)

## 2018-01-18 LAB — EXPECTORATED SPUTUM ASSESSMENT W REFEX TO RESP CULTURE

## 2018-01-18 LAB — INFLUENZA PANEL BY PCR (TYPE A & B)
INFLBPCR: NEGATIVE
Influenza A By PCR: NEGATIVE

## 2018-01-18 LAB — BASIC METABOLIC PANEL
Anion gap: 11 (ref 5–15)
BUN: 13 mg/dL (ref 6–20)
CALCIUM: 8.6 mg/dL — AB (ref 8.9–10.3)
CO2: 20 mmol/L — ABNORMAL LOW (ref 22–32)
Chloride: 107 mmol/L (ref 101–111)
Creatinine, Ser: 0.95 mg/dL (ref 0.61–1.24)
GFR calc Af Amer: >60 mL/min (ref >60)
Glucose, Bld: 178 mg/dL — ABNORMAL HIGH (ref 65–99)
POTASSIUM: 4.5 mmol/L (ref 3.5–5.1)
Sodium: 138 mmol/L (ref 135–145)

## 2018-01-18 LAB — STREP PNEUMONIAE URINARY ANTIGEN: STREP PNEUMO URINARY ANTIGEN: NEGATIVE

## 2018-01-18 MED ORDER — AZITHROMYCIN 250 MG PO TABS
500.0000 mg | ORAL_TABLET | Freq: Every day | ORAL | Status: DC
  Administered 2018-01-18 – 2018-01-20 (×3): 500 mg via ORAL
  Filled 2018-01-18 (×3): qty 2

## 2018-01-18 NOTE — ED Notes (Signed)
Attempted to call report

## 2018-01-18 NOTE — Plan of Care (Signed)
Pt oriented to unit, plan of care, and method of reporting concerns. Verbalizes understanding of need to call for assistance prior to ambulation. Call light and bedside table within reach 

## 2018-01-18 NOTE — Progress Notes (Signed)
PROGRESS NOTE    Andrew [name]  DTO:[id number] DOB: Mar 05, 1941 DOA: 01/17/2018 PCP: Lujean Amel, MD     Brief Narrative:  Andrew [name] is a 77 y.o. male with medical history significant for COPD with chronic hypoxic respiratory failure on 2-3L [place] at baseline and steroid dependence, paroxysmal atrial fibrillation on Eliquis, pulmonary hypertension on sildenafil, and hypertension, now presenting to the emergency department for evaluation of worsening dyspnea, wheezing, productive cough, generalized aches, and fevers.  Patient reports that he had been in his usual state of health until approximately 3 days ago when he noted worsening in his chronic dyspnea and cough.  Cough is been increasingly productive and his dyspnea has been worsening.  He called into his pulmonology clinic yesterday and was instructed to increase his prednisone.  He experienced continued worsening in his condition as well as development of high fevers. In the ED, CXR was negative for acute cardiopulmonary disease. Influenza PCR negative. He was admitted for evaluation and management of acute COPD exacerbation.   Assessment & Plan:   Principal Problem:   COPD with acute exacerbation ([place]) Active Problems:   Chronic respiratory failure with hypoxia (HCC)   Normocytic anemia   Paroxysmal atrial fibrillation (HCC)   OSA on CPAP   Influenza-like illness   Pressure injury of skin   COPD with acute exacerbation; chronic hypoxic respiratory failure  - Baseline [place] O2 2-3L   - Chronic prednisone 5 mg BID   - CXR personally reviewed, with chronic emphysematous changes but no acute consolidation, pleural effusions  - Influenza negative, strep pneumo negative - Sputum culture pending  - Blood cultures pending  - Continue nebs, azithromycin, solumedrol   Paroxysmal atrial fibrillation  - CHADS-VASc (age x2, HTN)  - Continue Eliquis, diltiazem, lopressor  - Normal sinus rhythm today   Hypertension  - BP stable,  continue lopressor, diltiazem   Pulmonary hypertension  - Continue sildenafil  GERD - Continue PPI   HLD - Continue crestor   BPH - Continue flomax, proscar      DVT prophylaxis: Eliquis Code Status: Full Family Communication: No family at bedside Disposition Plan: Pending improvement   Consultants:   None  Procedures:   None   Antimicrobials:  Anti-infectives (From admission, onward)   Start     Dose/Rate Route Frequency Ordered Stop   01/18/18 2000  azithromycin (ZITHROMAX) 500 mg in sodium chloride 0.9 % 250 mL IVPB     500 mg 250 mL/hr over 60 Minutes Intravenous Every 24 hours 01/17/18 2015 01/24/18 1959   01/17/18 2030  oseltamivir (TAMIFLU) capsule 30 mg     30 mg Oral  Once 01/17/18 2015 01/18/18 0019   01/17/18 1900  cefTRIAXone (ROCEPHIN) 1 g in sodium chloride 0.9 % 100 mL IVPB     1 g 200 mL/hr over 30 Minutes Intravenous  Once 01/17/18 1854 01/17/18 1956   01/17/18 1900  azithromycin (ZITHROMAX) 500 mg in sodium chloride 0.9 % 250 mL IVPB     500 mg 250 mL/hr over 60 Minutes Intravenous  Once 01/17/18 1854 01/17/18 2107        Subjective: States his breathing and coughing has improved since admission. Some abdominal pain but attributes it to coughing. No nausea, vomiting. No chest pain.   Objective: Vitals:   01/18/18 0445 01/18/18 0552 01/18/18 0816 01/18/18 0854  BP:  (!) 155/65 133/66   Pulse: 70 80 90   Resp: 20 18    Temp:  97.6 F (36.4 C)  TempSrc:  Oral    SpO2: 97% 97%  97%  Weight:  62.9 kg (138 lb 9.6 oz)    Height:  5\' 7"  (1.702 m)      Intake/Output Summary (Last 24 hours) at 01/18/2018 1053 Last data filed at 01/18/2018 0617 Gross per 24 hour  Intake 2185 ml  Output 500 ml  Net 1685 ml   Filed Weights   01/17/18 1744 01/18/18 0552  Weight: 64.4 kg (142 lb) 62.9 kg (138 lb 9.6 oz)    Examination:  General exam: Appears calm and comfortable  Respiratory system: +Wheezes. Respiratory effort normal on [place] O2. No  conversational dyspnea  Cardiovascular system: S1 & S2 heard, RRR. No JVD, murmurs, rubs, gallops or clicks. No pedal edema. Gastrointestinal system: Abdomen is nondistended, soft and nontender. No organomegaly or masses felt. Normal bowel sounds heard. Central nervous system: Alert and oriented. No focal neurological deficits. Extremities: Symmetric 5 x 5 power. Skin: No rashes, lesions or ulcers Psychiatry: Judgement and insight appear normal. Mood & affect appropriate.   Data Reviewed: I have personally reviewed following labs and imaging studies  CBC: Recent Labs  Lab 01/17/18 1813 01/18/18 0258  WBC 12.5* 14.5*  NEUTROABS 10.7*  --   HGB 11.2* 10.2*  HCT 34.8* 32.0*  MCV 92.8 93.0  PLT 199 161   Basic Metabolic Panel: Recent Labs  Lab 01/17/18 1813 01/18/18 0258  NA 136 138  K 3.8 4.5  CL 100* 107  CO2 23 20*  GLUCOSE 126* 178*  BUN 14 13  CREATININE 1.03 0.95  CALCIUM 9.0 8.6*   GFR: Estimated Creatinine Clearance: 57 mL/min (by C-G formula based on SCr of 0.95 mg/dL). Liver Function Tests: Recent Labs  Lab 01/17/18 1813  AST 16  ALT 14*  ALKPHOS 61  BILITOT 0.7  PROT 6.4*  ALBUMIN 3.9   No results for input(s): LIPASE, AMYLASE in the last 168 hours. No results for input(s): AMMONIA in the last 168 hours. Coagulation Profile: No results for input(s): INR, PROTIME in the last 168 hours. Cardiac Enzymes: No results for input(s): CKTOTAL, CKMB, CKMBINDEX, TROPONINI in the last 168 hours. BNP (last 3 results) No results for input(s): PROBNP in the last 8760 hours. HbA1C: No results for input(s): HGBA1C in the last 72 hours. CBG: No results for input(s): GLUCAP in the last 168 hours. Lipid Profile: No results for input(s): CHOL, HDL, LDLCALC, TRIG, CHOLHDL, LDLDIRECT in the last 72 hours. Thyroid Function Tests: No results for input(s): TSH, T4TOTAL, FREET4, T3FREE, THYROIDAB in the last 72 hours. Anemia Panel: No results for input(s): VITAMINB12,  FOLATE, FERRITIN, TIBC, IRON, RETICCTPCT in the last 72 hours. Sepsis Labs: Recent Labs  Lab 01/17/18 1835  LATICACIDVEN 1.26    Recent Results (from the past 240 hour(s))  Culture, sputum-assessment     Status: None   Collection Time: 01/18/18  5:14 AM  Result Value Ref Range Status   Specimen Description SPUTUM  Final   Special Requests Immunocompromised  Final   Sputum evaluation   Final    THIS SPECIMEN IS ACCEPTABLE FOR SPUTUM CULTURE Performed at [facility], [place] [address], [place], [place] [address]    Report Status 01/18/2018 FINAL  Final  Culture, respiratory (NON-Expectorated)     Status: None (Preliminary result)   Collection Time: 01/18/18  5:14 AM  Result Value Ref Range Status   Specimen Description SPUTUM  Final   Special Requests Immunocompromised Reflexed from [id number]  Final   Gram Stain  Final    ABUNDANT WBC PRESENT, PREDOMINANTLY PMN RARE SQUAMOUS EPITHELIAL CELLS PRESENT MODERATE GRAM NEGATIVE COCCI IN PAIRS RARE GRAM POSITIVE COCCI IN CLUSTERS RARE GRAM POSITIVE RODS RARE BUDDING YEAST SEEN Performed at [facility], [place] [address], [place], [place] [address]    Culture PENDING  Incomplete   Report Status PENDING  Incomplete       Radiology Studies: Dg Chest 2 View  Result Date: 01/17/2018 CLINICAL DATA:  Body aches, fever, productive cough EXAM: CHEST  2 VIEW COMPARISON:  09/22/2017 FINDINGS: Emphysematous changes with bullous changes in the right upper lobe. No pleural effusion or pneumothorax. The heart is normal in size. Visualized osseous structures are within normal limits. IMPRESSION: No evidence of acute cardiopulmonary disease. Electronically Signed   By: Julian Hy M.D.   On: 01/17/2018 18:24      Scheduled Meds: . apixaban  5 mg Oral BID  . benzonatate  100 mg Oral TID  . diltiazem  120 mg Oral QHS  . feeding supplement  1 Container Oral TID BM  . finasteride  5 mg Oral QHS  . fluticasone  1 spray Each Nare  Daily  . guaiFENesin  600 mg Oral BID  . ipratropium-albuterol  3 mL Nebulization TID  . methylPREDNISolone (SOLU-MEDROL) injection  60 mg Intravenous Q6H  . metoprolol tartrate  25 mg Oral BID  . mometasone-formoterol  2 puff Inhalation BID  . multivitamin with minerals  1 tablet Oral Daily  . pantoprazole  40 mg Oral Daily  . polyethylene glycol  17 g Oral QHS  . rosuvastatin  10 mg Oral q1800  . sildenafil  20 mg Oral BID  . sodium chloride flush  3 mL Intravenous Q12H  . tamsulosin  0.4 mg Oral QHS   Continuous Infusions: . azithromycin       LOS: 1 day    Time spent: 40 minutes   Dessa Phi, DO [facility] [email or web address] Password [facility] 01/18/2018, 10:53 AM

## 2018-01-19 LAB — CBC
HCT: 28.8 % — ABNORMAL LOW (ref 39.0–52.0)
Hemoglobin: 9.3 g/dL — ABNORMAL LOW (ref 13.0–17.0)
MCH: 30.1 pg (ref 26.0–34.0)
MCHC: 32.3 g/dL (ref 30.0–36.0)
MCV: 93.2 fL (ref 78.0–100.0)
PLATELETS: 199 10*3/uL (ref 150–400)
RBC: 3.09 MIL/uL — ABNORMAL LOW (ref 4.22–5.81)
RDW: 14.1 % (ref 11.5–15.5)
WBC: 13.8 10*3/uL — ABNORMAL HIGH (ref 4.0–10.5)

## 2018-01-19 LAB — LEGIONELLA PNEUMOPHILA SEROGP 1 UR AG: L. PNEUMOPHILA SEROGP 1 UR AG: NEGATIVE

## 2018-01-19 LAB — GLUCOSE, CAPILLARY: GLUCOSE-CAPILLARY: 173 mg/dL — AB (ref 65–99)

## 2018-01-19 MED ORDER — IPRATROPIUM-ALBUTEROL 0.5-2.5 (3) MG/3ML IN SOLN: 3.0000 mL | RESPIRATORY_TRACT | Status: DC | PRN

## 2018-01-19 NOTE — Plan of Care (Signed)
  Clinical Measurements: Respiratory complications will improve 01/19/2018 2357 - Progressing by Claudine Mouton, RN  Pt. No longer displays dyspnea at rest. He is changed from high flow to 3L nasal canula.

## 2018-01-19 NOTE — Discharge Instructions (Signed)

## 2018-01-19 NOTE — Progress Notes (Addendum)
PROGRESS NOTE    [name] Andrew  XBL:[id number] DOB: [birth year] DOA: 01/17/2018 PCP: Lujean Amel, MD     Brief Narrative:  Andrew [name] is a 77 y.o. 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  Patient reports that he had been in his usual state of health until approximately 3 days ago when he noted worsening in his chronic dyspnea and cough.  Cough is been increasingly productive and his dyspnea has been worsening.  He called into his pulmonology clinic yesterday and was instructed to increase his prednisone.  He experienced continued worsening in his condition as well as development of high fevers. In the ED, CXR was negative for acute cardiopulmonary disease. Influenza PCR negative. He was admitted for evaluation and management of acute COPD exacerbation.   Assessment & Plan:   Principal Problem:   COPD with acute exacerbation ([place]) Active Problems:   Chronic respiratory failure with hypoxia (HCC)   Normocytic anemia   Paroxysmal atrial fibrillation (HCC)   OSA on CPAP   Influenza-like illness   Pressure injury of skin   COPD with acute exacerbation; chronic hypoxic respiratory failure  - Baseline [place] O2 2-3L   - Chronic prednisone 5 mg BID   - CXR personally reviewed, with chronic emphysematous changes but no acute consolidation, pleural effusions  - Influenza negative, strep pneumo negative - Sputum culture pending  - Blood cultures pending   - Continue nebs, azithromycin, solumedrol   Paroxysmal atrial fibrillation  - CHADS-VASc (age x2, HTN)  - Continue Eliquis, diltiazem, lopressor  - NSR today   Hypertension  - BP stable this morning, continue  lopressor, diltiazem   Pulmonary hypertension  - Continue sildenafil  GERD - Continue PPI   HLD - Continue crestor   BPH - Continue flomax, proscar    DVT prophylaxis: Eliquis Code Status: Full Family Communication: No family at bedside Disposition Plan: Pending improvement, hopeful discharge home 3/5 if clinically improved    Consultants:   None  Procedures:   None   Antimicrobials:  Anti-infectives (From admission, onward)   Start     Dose/Rate Route Frequency Ordered Stop   01/18/18 2200  azithromycin (ZITHROMAX) tablet 500 mg     500 mg Oral Daily 01/18/18 1310 01/24/18 2159   01/18/18 2000  azithromycin (ZITHROMAX) 500 mg in sodium chloride 0.9 % 250 mL IVPB  Status:  Discontinued     500 mg 250 mL/hr over 60 Minutes Intravenous Every 24 hours 01/17/18 2015 01/18/18 1310   01/17/18 2030  oseltamivir (TAMIFLU) capsule 30 mg     30 mg Oral  Once 01/17/18 2015 01/18/18 0019   01/17/18 1900  cefTRIAXone (ROCEPHIN) 1 g in sodium chloride 0.9 % 100 mL IVPB     1 g 200 mL/hr over 30 Minutes Intravenous  Once 01/17/18 1854 01/17/18 1956   01/17/18 1900  azithromycin (ZITHROMAX) 500 mg in sodium chloride 0.9 % 250 mL IVPB     500 mg 250 mL/hr over 60 Minutes Intravenous  Once 01/17/18 1854 01/17/18 2107       Subjective: States he felt worse yesterday afternoon with shortness of breath. Overall not feeling back to baseline. Continues to cough.   Objective: Vitals:   01/18/18 1937 01/18/18 2004 01/19/18 0321 01/19/18 3007  BP:  125/61 (!) 103/46   Pulse:  76 81   Resp:  18 20   Temp:  98.6 F (37 C) 97.8 F (36.6 C)   TempSrc:  Oral Axillary   SpO2: 97% 99% 100% 99%  Weight:   63.1 kg (139 lb 1.6 oz)   Height:        Intake/Output Summary (Last 24 hours) at 01/19/2018 1047 Last data filed at 01/18/2018 2300 Gross per 24 hour  Intake 960 ml  Output 1300 ml  Net -340 ml   Filed Weights   01/17/18 1744 01/18/18 0552 01/19/18 0321  Weight: 64.4 kg (142  lb) 62.9 kg (138 lb 9.6 oz) 63.1 kg (139 lb 1.6 oz)    Examination: General exam: Appears calm and comfortable  Respiratory system: Clear to auscultation, improved. Respiratory effort normal on [place] O2 Cardiovascular system: S1 & S2 heard, RRR. No JVD, murmurs, rubs, gallops or clicks. No pedal edema. Gastrointestinal system: Abdomen is nondistended, soft and nontender. No organomegaly or masses felt. Normal bowel sounds heard. Central nervous system: Alert and oriented. No focal neurological deficits. Extremities: Symmetric 5 x 5 power. Skin: No rashes, lesions or ulcers Psychiatry: Judgement and insight appear normal. Mood & affect appropriate.    Data Reviewed: I have personally reviewed following labs and imaging studies  CBC: Recent Labs  Lab 01/17/18 1813 01/18/18 0258 01/19/18 0315  WBC 12.5* 14.5* 13.8*  NEUTROABS 10.7*  --   --   HGB 11.2* 10.2* 9.3*  HCT 34.8* 32.0* 28.8*  MCV 92.8 93.0 93.2  PLT 199 195 027   Basic Metabolic Panel: Recent Labs  Lab 01/17/18 1813 01/18/18 0258  NA 136 138  K 3.8 4.5  CL 100* 107  CO2 23 20*  GLUCOSE 126* 178*  BUN 14 13  CREATININE 1.03 0.95  CALCIUM 9.0 8.6*   GFR: Estimated Creatinine Clearance: 57.2 mL/min (by C-G formula based on SCr of 0.95 mg/dL). Liver Function Tests: Recent Labs  Lab 01/17/18 1813  AST 16  ALT 14*  ALKPHOS 61  BILITOT 0.7  PROT 6.4*  ALBUMIN 3.9   No results for input(s): LIPASE, AMYLASE in the last 168 hours. No results for input(s): AMMONIA in the last 168 hours. Coagulation Profile: No results for input(s): INR, PROTIME in the last 168 hours. Cardiac Enzymes: No results for input(s): CKTOTAL, CKMB, CKMBINDEX, TROPONINI in the last 168 hours. BNP (last 3 results) No results for input(s): PROBNP in the last 8760 hours. HbA1C: No results for input(s): HGBA1C in the last 72 hours. CBG: Recent Labs  Lab 01/19/18 0748  GLUCAP 173*   Lipid Profile: No results for input(s): CHOL,  HDL, LDLCALC, TRIG, CHOLHDL, LDLDIRECT in the last 72 hours. Thyroid Function Tests: No results for input(s): TSH, T4TOTAL, FREET4, T3FREE, THYROIDAB in the last 72 hours. Anemia Panel: No results for input(s): VITAMINB12, FOLATE, FERRITIN, TIBC, IRON, RETICCTPCT in the last 72 hours. Sepsis Labs: Recent Labs  Lab 01/17/18 1835  LATICACIDVEN 1.26    Recent Results (from the past 240 hour(s))  Culture, sputum-assessment     Status: None   Collection Time: 01/18/18  5:14 AM  Result Value Ref Range Status   Specimen Description SPUTUM  Final   Special Requests Immunocompromised  Final   Sputum evaluation   Final    THIS SPECIMEN IS ACCEPTABLE FOR SPUTUM CULTURE Performed at [facility], [place] [address] [place], [place] [address]    Report Status 01/18/2018 FINAL  Final  Culture,  respiratory (NON-Expectorated)     Status: None (Preliminary result)   Collection Time: 01/18/18  5:14 AM  Result Value Ref Range Status   Specimen Description SPUTUM  Final   Special Requests Immunocompromised Reflexed from [id number]  Final   Gram Stain   Final    ABUNDANT WBC PRESENT, PREDOMINANTLY PMN RARE SQUAMOUS EPITHELIAL CELLS PRESENT MODERATE GRAM NEGATIVE COCCI IN PAIRS RARE GRAM POSITIVE COCCI IN CLUSTERS RARE GRAM POSITIVE RODS RARE BUDDING YEAST SEEN Performed at [facility], [place] [address], [place], [place] [address]    Culture PENDING  Incomplete   Report Status PENDING  Incomplete       Radiology Studies: Dg Chest 2 View  Result Date: 01/17/2018 CLINICAL DATA:  Body aches, fever, productive cough EXAM: CHEST  2 VIEW COMPARISON:  09/22/2017 FINDINGS: Emphysematous changes with bullous changes in the right upper lobe. No pleural effusion or pneumothorax. The heart is normal in size. Visualized osseous structures are within normal limits. IMPRESSION: No evidence of acute cardiopulmonary disease. Electronically Signed   By: Julian Hy M.D.   On: 01/17/2018 18:24       Scheduled Meds: . apixaban  5 mg Oral BID  . azithromycin  500 mg Oral Daily  . benzonatate  100 mg Oral TID  . diltiazem  120 mg Oral QHS  . feeding supplement  1 Container Oral TID BM  . finasteride  5 mg Oral QHS  . fluticasone  1 spray Each Nare Daily  . guaiFENesin  600 mg Oral BID  . methylPREDNISolone (SOLU-MEDROL) injection  60 mg Intravenous Q6H  . metoprolol tartrate  25 mg Oral BID  . mometasone-formoterol  2 puff Inhalation BID  . multivitamin with minerals  1 tablet Oral Daily  . pantoprazole  40 mg Oral Daily  . polyethylene glycol  17 g Oral QHS  . rosuvastatin  10 mg Oral q1800  . sildenafil  20 mg Oral BID  . sodium chloride flush  3 mL Intravenous Q12H  . tamsulosin  0.4 mg Oral QHS   Continuous Infusions:    LOS: 2 days    Time spent: 20 minutes   Dessa Phi, DO [facility] [email or web address] Password [facility] 01/19/2018, 10:47 AM

## 2018-01-19 NOTE — Plan of Care (Signed)
  Activity: Risk for activity intolerance will decrease 01/19/2018 0025 - Progressing by Theora Gianotti, RN   Pt ambulates independently. C/o dyspnea with ambulation.

## 2018-01-19 NOTE — Progress Notes (Signed)
Pt states that he will place self on CPAP later on once he is ready for bed. No complications noted at this time.

## 2018-01-19 NOTE — Progress Notes (Signed)
[facility] [employer] PREP Weekly Session   Patient Details  Name: Andrew [name] MRN: [id number] Date of Birth: [birth year] Age: 77 y.o. PCP: Lujean Amel, MD  Vitals:   01/14/18 0933  Weight: 142 lb (64.4 kg)    [facility] Weekly seesion - 01/19/18 0900      Weekly Session   Topic Discussed  Health habits      [name] has popped in this week for PREP class as he has been invited to do so if he'd like to keep him encouraged and on track with his exercise routine.    Vanita Ingles 01/19/2018, 9:33 AM

## 2018-01-20 LAB — CULTURE, RESPIRATORY W GRAM STAIN: Culture: NORMAL

## 2018-01-20 LAB — CULTURE, RESPIRATORY

## 2018-01-20 LAB — CBC
HCT: 28.2 % — ABNORMAL LOW (ref 39.0–52.0)
Hemoglobin: 8.8 g/dL — ABNORMAL LOW (ref 13.0–17.0)
MCH: 28.9 pg (ref 26.0–34.0)
MCHC: 31.2 g/dL (ref 30.0–36.0)
MCV: 92.5 fL (ref 78.0–100.0)
Platelets: 204 10*3/uL (ref 150–400)
RBC: 3.05 MIL/uL — ABNORMAL LOW (ref 4.22–5.81)
RDW: 13.9 % (ref 11.5–15.5)
WBC: 11.9 10*3/uL — ABNORMAL HIGH (ref 4.0–10.5)

## 2018-01-20 LAB — GLUCOSE, CAPILLARY: Glucose-Capillary: 132 mg/dL — ABNORMAL HIGH (ref 65–99)

## 2018-01-20 MED ORDER — METHYLPREDNISOLONE SODIUM SUCC 125 MG IJ SOLR
60.0000 mg | INTRAMUSCULAR | Status: DC
  Administered 2018-01-21: 60 mg via INTRAVENOUS
  Filled 2018-01-20: qty 2

## 2018-01-20 NOTE — Progress Notes (Signed)
PROGRESS NOTE    Andrew [name]  SNK:[id number] DOB: July 20, 1941 DOA: 01/17/2018 PCP: Lujean Amel, MD     Brief Narrative:  Andrew [name] is a 77 y.o. 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  Patient reports that he had been in his usual state of health until approximately 3 days ago when he noted worsening in his chronic dyspnea and cough.  Cough is been increasingly productive and his dyspnea has been worsening.  He called into his pulmonology clinic yesterday and was instructed to increase his prednisone.  He experienced continued worsening in his condition as well as development of high fevers. In the ED, CXR was negative for acute cardiopulmonary disease. Influenza PCR negative. He was admitted for evaluation and management of acute COPD exacerbation.   Assessment & Plan:   Principal Problem:   COPD with acute exacerbation ([place]) Active Problems:   Chronic respiratory failure with hypoxia (HCC)   Normocytic anemia   Paroxysmal atrial fibrillation (HCC)   OSA on CPAP   Influenza-like illness   Pressure injury of skin   COPD with acute exacerbation; chronic hypoxic respiratory failure  - Baseline [place] O2 2-3L   - Chronic prednisone 5 mg BID   - CXR personally reviewed, with chronic emphysematous changes but no acute consolidation, pleural effusions  - Influenza negative, strep pneumo negative - Sputum culture pending  - Blood cultures negative to date  - Continue nebs, azithromycin, solumedrol   Paroxysmal atrial fibrillation  - CHADS-VASc (age x2, HTN)  - Continue Eliquis, diltiazem, lopressor  - Ventricular bigemeny noted on telemetry   Hypertension   - BP stable today, continue lopressor, diltiazem   Pulmonary hypertension  - Continue sildenafil  GERD - Continue PPI   HLD - Continue crestor   BPH - Continue flomax, proscar    DVT prophylaxis: Eliquis Code Status: Full Family Communication: No family at bedside Disposition Plan: Pending improvement, hopeful discharge home 3/6 if clinically improved, breathing stable   Consultants:   None  Procedures:   None   Antimicrobials:  Anti-infectives (From admission, onward)   Start     Dose/Rate Route Frequency Ordered Stop   01/18/18 2200  azithromycin (ZITHROMAX) tablet 500 mg     500 mg Oral Daily 01/18/18 1310 01/24/18 2159   01/18/18 2000  azithromycin (ZITHROMAX) 500 mg in sodium chloride 0.9 % 250 mL IVPB  Status:  Discontinued     500 mg 250 mL/hr over 60 Minutes Intravenous Every 24 hours 01/17/18 2015 01/18/18 1310   01/17/18 2030  oseltamivir (TAMIFLU) capsule 30 mg     30 mg Oral  Once 01/17/18 2015 01/18/18 0019   01/17/18 1900  cefTRIAXone (ROCEPHIN) 1 g in sodium chloride 0.9 % 100 mL IVPB     1 g 200 mL/hr over 30 Minutes Intravenous  Once 01/17/18 1854 01/17/18 1956   01/17/18 1900  azithromycin (ZITHROMAX) 500 mg in sodium chloride 0.9 % 250 mL IVPB     500 mg 250 mL/hr over 60 Minutes Intravenous  Once 01/17/18 1854 01/17/18 2107       Subjective: States that he felt better yesterday, was able to ambulate down the hall. However, this morning he seems to feel worse. He worries about going home too soon and  coming right back to the hospital.  Denies any chest pain today.  Objective: Vitals:   01/19/18 2301 01/20/18 0436 01/20/18 0821 01/20/18 0847  BP: (!) 100/55 (!) 104/58    Pulse: 76 (!) 54  83  Resp:      Temp:  97.6 F (36.4 C)    TempSrc:  Oral    SpO2:  99% 98%   Weight:  68.1 kg (150 lb 2.1 oz)    Height:        Intake/Output Summary (Last 24 hours) at 01/20/2018 1025 Last data filed at 01/19/2018 1937 Gross per 24 hour  Intake  720 ml  Output 1000 ml  Net -280 ml   Filed Weights   01/18/18 0552 01/19/18 0321 01/20/18 0436  Weight: 62.9 kg (138 lb 9.6 oz) 63.1 kg (139 lb 1.6 oz) 68.1 kg (150 lb 2.1 oz)    Examination: General exam: Appears calm and comfortable  Respiratory system: Diminished breath sounds bilaterally. respiratory effort normal.  On nasal cannula O2 Cardiovascular system: S1 & S2 heard, RRR. No JVD, murmurs, rubs, gallops or clicks. No pedal edema. Gastrointestinal system: Abdomen is nondistended, soft and nontender. No organomegaly or masses felt. Normal bowel sounds heard. Central nervous system: Alert and oriented. No focal neurological deficits. Extremities: Symmetric 5 x 5 power. Skin: No rashes, lesions or ulcers Psychiatry: Judgement and insight appear normal. Mood & affect appropriate.    Data Reviewed: I have personally reviewed following labs and imaging studies  CBC: Recent Labs  Lab 01/17/18 1813 01/18/18 0258 01/19/18 0315 01/20/18 0344  WBC 12.5* 14.5* 13.8* 11.9*  NEUTROABS 10.7*  --   --   --   HGB 11.2* 10.2* 9.3* 8.8*  HCT 34.8* 32.0* 28.8* 28.2*  MCV 92.8 93.0 93.2 92.5  PLT 199 195 199 270   Basic Metabolic Panel: Recent Labs  Lab 01/17/18 1813 01/18/18 0258  NA 136 138  K 3.8 4.5  CL 100* 107  CO2 23 20*  GLUCOSE 126* 178*  BUN 14 13  CREATININE 1.03 0.95  CALCIUM 9.0 8.6*   GFR: Estimated Creatinine Clearance: 59.9 mL/min (by C-G formula based on SCr of 0.95 mg/dL). Liver Function Tests: Recent Labs  Lab 01/17/18 1813  AST 16  ALT 14*  ALKPHOS 61  BILITOT 0.7  PROT 6.4*  ALBUMIN 3.9   No results for input(s): LIPASE, AMYLASE in the last 168 hours. No results for input(s): AMMONIA in the last 168 hours. Coagulation Profile: No results for input(s): INR, PROTIME in the last 168 hours. Cardiac Enzymes: No results for input(s): CKTOTAL, CKMB, CKMBINDEX, TROPONINI in the last 168 hours. BNP (last 3 results) No results for input(s): PROBNP  in the last 8760 hours. HbA1C: No results for input(s): HGBA1C in the last 72 hours. CBG: Recent Labs  Lab 01/19/18 0748 01/20/18 0434  GLUCAP 173* 132*   Lipid Profile: No results for input(s): CHOL, HDL, LDLCALC, TRIG, CHOLHDL, LDLDIRECT in the last 72 hours. Thyroid Function Tests: No results for input(s): TSH, T4TOTAL, FREET4, T3FREE, THYROIDAB in the last 72 hours. Anemia Panel: No results for input(s): VITAMINB12, FOLATE, FERRITIN, TIBC, IRON, RETICCTPCT in the last 72 hours. Sepsis Labs: Recent Labs  Lab 01/17/18 1835  LATICACIDVEN 1.26    Recent Results (from the past 240 hour(s))  Culture, blood (routine x 2) Call MD if unable to obtain prior to antibiotics being given     Status: None (Preliminary result)   Collection Time: 01/17/18  7:15 PM  Result Value  Ref Range Status   Specimen Description BLOOD RIGHT FOREARM  Final   Special Requests   Final    BOTTLES DRAWN AEROBIC AND ANAEROBIC Blood Culture adequate volume   Culture   Final    NO GROWTH 1 DAY Performed at [facility], [address], [place], [place] [address]    Report Status PENDING  Incomplete  Culture, sputum-assessment     Status: None   Collection Time: 01/18/18  5:14 AM  Result Value Ref Range Status   Specimen Description SPUTUM  Final   Special Requests Immunocompromised  Final   Sputum evaluation   Final    THIS SPECIMEN IS ACCEPTABLE FOR SPUTUM CULTURE Performed at [facility], [address] [place], [place] [address]    Report Status 01/18/2018 FINAL  Final  Culture, respiratory (NON-Expectorated)     Status: None (Preliminary result)   Collection Time: 01/18/18  5:14 AM  Result Value Ref Range Status   Specimen Description SPUTUM  Final   Special Requests Immunocompromised Reflexed from [id number]  Final   Gram Stain   Final    ABUNDANT WBC PRESENT, PREDOMINANTLY PMN RARE SQUAMOUS EPITHELIAL CELLS PRESENT MODERATE GRAM NEGATIVE COCCI IN PAIRS RARE GRAM POSITIVE COCCI IN  CLUSTERS RARE GRAM POSITIVE RODS RARE BUDDING YEAST SEEN    Culture   Final    CULTURE REINCUBATED FOR BETTER GROWTH Performed at [facility], [place] [address] [place], [place] [address]    Report Status PENDING  Incomplete       Radiology Studies: No results found.    Scheduled Meds: . apixaban  5 mg Oral BID  . azithromycin  500 mg Oral Daily  . benzonatate  100 mg Oral TID  . diltiazem  120 mg Oral QHS  . feeding supplement  1 Container Oral TID BM  . finasteride  5 mg Oral QHS  . fluticasone  1 spray Each Nare Daily  . guaiFENesin  600 mg Oral BID  . methylPREDNISolone (SOLU-MEDROL) injection  60 mg Intravenous Q6H  . metoprolol tartrate  25 mg Oral BID  . mometasone-formoterol  2 puff Inhalation BID  . multivitamin with minerals  1 tablet Oral Daily  . pantoprazole  40 mg Oral Daily  . polyethylene glycol  17 g Oral QHS  . rosuvastatin  10 mg Oral q1800  . sildenafil  20 mg Oral BID  . sodium chloride flush  3 mL Intravenous Q12H  . tamsulosin  0.4 mg Oral QHS   Continuous Infusions:    LOS: 3 days    Time spent: 20 minutes   Dessa Phi, DO [facility] [email or web address] Password [email or web address] 01/20/2018, 10:25 AM

## 2018-01-20 NOTE — Plan of Care (Signed)
  Safety: Ability to remain free from injury will improve 01/20/2018 2227 - Completed/Met by Theora Gianotti, RN   Pt remains free from falls. Educated on use of call light system. Verbalizes understanding of need to call for assistance prior to ambulation when necessary.

## 2018-01-21 LAB — CBC
HEMATOCRIT: 29.1 % — AB (ref 39.0–52.0)
Hemoglobin: 9 g/dL — ABNORMAL LOW (ref 13.0–17.0)
MCH: 28.9 pg (ref 26.0–34.0)
MCHC: 30.9 g/dL (ref 30.0–36.0)
MCV: 93.6 fL (ref 78.0–100.0)
PLATELETS: 215 10*3/uL (ref 150–400)
RBC: 3.11 MIL/uL — AB (ref 4.22–5.81)
RDW: 13.9 % (ref 11.5–15.5)
WBC: 10.2 10*3/uL (ref 4.0–10.5)

## 2018-01-21 LAB — GLUCOSE, CAPILLARY
Glucose-Capillary: 125 mg/dL — ABNORMAL HIGH (ref 65–99)
Glucose-Capillary: 115 mg/dL — ABNORMAL HIGH (ref 65–99)

## 2018-01-21 MED ORDER — HYDROCODONE-ACETAMINOPHEN 5-325 MG PO TABS: 1.0000 | ORAL_TABLET | Freq: Every evening | ORAL | 0 refills | Status: DC | PRN

## 2018-01-21 MED ORDER — AZITHROMYCIN 250 MG PO TABS: 250.0000 mg | ORAL_TABLET | Freq: Every day | ORAL | 0 refills | Status: DC

## 2018-01-21 MED ORDER — PREDNISONE 10 MG PO TABS: 10.0000 mg | ORAL_TABLET | Freq: Every day | ORAL | 0 refills | Status: DC

## 2018-01-21 MED ORDER — PREDNISONE 20 MG PO TABS: ORAL_TABLET | ORAL | 0 refills | Status: DC

## 2018-01-21 NOTE — Care Management Important Message (Signed)
Important Message  Patient Details  Name: Andrew [name] MRN: [id number] Date of Birth: 01/03/1941   Medicare Important Message Given:  Yes    [name] 01/21/2018, 2:20 PM

## 2018-01-21 NOTE — Evaluation (Signed)
Physical Therapy Evaluation & Discharge Patient Details Name: Andrew [name] MRN: [id number] DOB: 04-11-1941 Today's Date: 01/21/2018   History of Present Illness  Pt is a 77 y/o male admitted secondary to worsening dyspnea. Pt found to have an acute COPD exacerbation. PMH including but not limited to HLD, HTN and COPD.  Clinical Impression  Pt presented supine in bed with HOB elevated, awake and willing to participate in therapy session. Prior to admission, pt reported that he was independent with all functional mobility and ADLs. Pt reported that he goes to the [facility] to exercise 5-6 days/week. Pt currently performed bed mobility independently, transfers at modified independence, and ambulated in hallway with supervision without use of an AD. Pt on 3L of O2 throughout with SPO2 maintaining >91% throughout. Pt reported that he feels he is almost back to his baseline. No further acute PT needs identified at this time. PT signing off.     Follow Up Recommendations No PT follow up    Equipment Recommendations  None recommended by PT    Recommendations for Other Services       Precautions / Restrictions Precautions Precautions: None Precaution Comments: watch SPO2 Restrictions Weight Bearing Restrictions: No      Mobility  Bed Mobility Overal bed mobility: Independent                Transfers Overall transfer level: Modified independent Equipment used: None                Ambulation/Gait Ambulation/Gait assistance: Supervision Ambulation Distance (Feet): 200 Feet Assistive device: None Gait Pattern/deviations: Step-through pattern;Drifts right/left Gait velocity: WFL Gait velocity interpretation: at or above normal speed for age/gender General Gait Details: mildly unsteady without use of an AD, but no overt LOB or need for physical assistance  Stairs            Wheelchair Mobility    Modified Rankin (Stroke Patients Only)       Balance Overall balance  assessment: Needs assistance Sitting-balance support: No upper extremity supported;Feet supported Sitting balance-Leahy Scale: Good     Standing balance support: During functional activity;No upper extremity supported Standing balance-Leahy Scale: Good                               Pertinent Vitals/Pain Pain Assessment: No/denies pain    Home Living Family/patient expects to be discharged to:: Private residence Living Arrangements: Children Available Help at Discharge: Family;Available 24 hours/day Type of Home: House Home Access: Level entry     Home Layout: One level Home Equipment: Shower seat      Prior Function Level of Independence: Independent         Comments: pt reported that he is very active, goes to the [facility] to exercise 5-6 days/week     Hand Dominance        Extremity/Trunk Assessment   Upper Extremity Assessment Upper Extremity Assessment: Overall WFL for tasks assessed    Lower Extremity Assessment Lower Extremity Assessment: Overall WFL for tasks assessed       Communication   Communication: No difficulties  Cognition Arousal/Alertness: Awake/alert Behavior During Therapy: WFL for tasks assessed/performed Overall Cognitive Status: Within Functional Limits for tasks assessed                                        General Comments  Exercises     Assessment/Plan    PT Assessment Patent does not need any further PT services  PT Problem List         PT Treatment Interventions      PT Goals (Current goals can be found in the Care Plan section)  Acute Rehab PT Goals Patient Stated Goal: return home and to being active    Frequency     Barriers to discharge        Co-evaluation               AM-PAC PT "6 Clicks" Daily Activity  Outcome Measure Difficulty turning over in bed (including adjusting bedclothes, sheets and blankets)?: None Difficulty moving from lying on back to sitting on  the side of the bed? : None Difficulty sitting down on and standing up from a chair with arms (e.g., wheelchair, bedside commode, etc,.)?: None Help needed moving to and from a bed to chair (including a wheelchair)?: None Help needed walking in hospital room?: None Help needed climbing 3-5 steps with a railing? : A Little 6 Click Score: 23    End of Session Equipment Utilized During Treatment: Oxygen Activity Tolerance: Patient tolerated treatment well Patient left: in bed;with call bell/phone within reach Nurse Communication: Mobility status PT Visit Diagnosis: Muscle weakness (generalized) (M62.81)    Time: 9147-8295 PT Time Calculation (min) (ACUTE ONLY): 11 min   Charges:   PT Evaluation $PT Eval Low Complexity: 1 Low     PT G Codes:        [place], PT, DPT [place] 01/21/2018, 9:56 AM

## 2018-01-23 LAB — CULTURE, BLOOD (ROUTINE X 2)
Culture: NO GROWTH
SPECIAL REQUESTS: ADEQUATE

## 2018-01-27 DIAGNOSIS — D225 Melanocytic nevi of trunk: Secondary | ICD-10-CM | POA: Diagnosis not present

## 2018-01-27 DIAGNOSIS — L814 Other melanin hyperpigmentation: Secondary | ICD-10-CM | POA: Diagnosis not present

## 2018-01-27 DIAGNOSIS — Z85820 Personal history of malignant melanoma of skin: Secondary | ICD-10-CM | POA: Diagnosis not present

## 2018-01-27 DIAGNOSIS — L821 Other seborrheic keratosis: Secondary | ICD-10-CM | POA: Diagnosis not present

## 2018-01-27 DIAGNOSIS — L820 Inflamed seborrheic keratosis: Secondary | ICD-10-CM | POA: Diagnosis not present

## 2018-01-28 ENCOUNTER — Ambulatory Visit: Payer: Medicare Other | Admitting: Pulmonary Disease

## 2018-01-30 DIAGNOSIS — R0602 Shortness of breath: Secondary | ICD-10-CM | POA: Diagnosis not present

## 2018-01-30 DIAGNOSIS — I10 Essential (primary) hypertension: Secondary | ICD-10-CM | POA: Diagnosis not present

## 2018-01-30 DIAGNOSIS — J439 Emphysema, unspecified: Secondary | ICD-10-CM | POA: Diagnosis not present

## 2018-02-02 ENCOUNTER — Telehealth: Payer: Self-pay | Admitting: Cardiovascular Disease

## 2018-02-02 ENCOUNTER — Encounter: Payer: Self-pay | Admitting: Pulmonary Disease

## 2018-02-02 ENCOUNTER — Ambulatory Visit (INDEPENDENT_AMBULATORY_CARE_PROVIDER_SITE_OTHER): Payer: Medicare Other | Admitting: Pulmonary Disease

## 2018-02-02 VITALS — BP 110/62 | HR 34 | Ht 67.0 in | Wt 137.0 lb

## 2018-02-02 DIAGNOSIS — J432 Centrilobular emphysema: Secondary | ICD-10-CM

## 2018-02-02 DIAGNOSIS — I493 Ventricular premature depolarization: Secondary | ICD-10-CM | POA: Diagnosis not present

## 2018-02-02 DIAGNOSIS — I480 Paroxysmal atrial fibrillation: Secondary | ICD-10-CM | POA: Diagnosis not present

## 2018-02-02 DIAGNOSIS — Z8701 Personal history of pneumonia (recurrent): Secondary | ICD-10-CM | POA: Diagnosis not present

## 2018-02-02 DIAGNOSIS — D649 Anemia, unspecified: Secondary | ICD-10-CM | POA: Diagnosis not present

## 2018-02-02 DIAGNOSIS — J9611 Chronic respiratory failure with hypoxia: Secondary | ICD-10-CM | POA: Diagnosis not present

## 2018-02-02 DIAGNOSIS — I272 Pulmonary hypertension, unspecified: Secondary | ICD-10-CM | POA: Diagnosis not present

## 2018-02-02 DIAGNOSIS — G4733 Obstructive sleep apnea (adult) (pediatric): Secondary | ICD-10-CM

## 2018-02-02 DIAGNOSIS — R001 Bradycardia, unspecified: Secondary | ICD-10-CM

## 2018-02-02 DIAGNOSIS — Z9989 Dependence on other enabling machines and devices: Secondary | ICD-10-CM

## 2018-02-02 DIAGNOSIS — I10 Essential (primary) hypertension: Secondary | ICD-10-CM

## 2018-02-02 NOTE — Telephone Encounter (Signed)
DOD primary has addressed

## 2018-02-02 NOTE — Patient Instructions (Addendum)
Today we updated your med list in our EPIC system...    Continue your current medications the same for now...  We will arrange for a CARDIOLOGY appt for further evaluation & treatment of your PVCs/ Bigeminy...  Hold off on your GYM exercise until this is checked by CARDS...  Check w/ your PCP- DrKoirala to see if he's done the blood work to evaluate your ANEMIA and rechecked iron levels etc...  Call for any questions...  Let's plan a follow up visit in 51mo, sooner if needed for acute problems.Marland KitchenMarland Kitchen

## 2018-02-02 NOTE — Progress Notes (Signed)
Subjective:     Patient ID: Andrew [name], male   DOB: 1941-10-29, 77 y.o.   MRN: [id number]  HPI 77 y/o WM, an ex-smoker quit in 1986, w/ severe bullous emphysema & hx of RUL bullous resection in 2007; Hx both ?hypercarbic & +hypoxemic resp failure w/ cor pulmonale & secondary pulm HTN on Revatio x yrs; He has been stable for >10 yrs on the same pulm regimen as he has travelled around the [place] living in [place], [place], and [place]...  ~  October 02, 2015:  Initial pulmonary evaluation by SN>  His PCP is Dr. Lujean Amel, [name]...       [name] is a 77 y/o gentleman from [place]- moved here to [place] ~[place]moago to live w/ his daughter & son-in-law;  He has a long convoluted history & we have none of his prev objective data to review;  He tells me that he has known about COPD/Emphysema for >10 yrs and in 2007 he had right thoracotomy & "bleb-ectomy";  After this procedure he was placed on Oxygen at 2L/min and BiPAP to use at night, along w/ ADVAIR250Bid & SPIRIVA daily, plus REVATIO20Tid for pulmonary HTN;  He was also treated w/ NEBS for about 1-222yrthen this was discontinued;  He has pretty much been on this same regimen for the past 9 yrs w/o much change, despite or maybe because he has moved around a lot- [name], then [place] last [place]...  He describes himself as being rock-solid stable on this exact regimen since 2007- he had Cath (?left & right heart) in 2007, told 1 blockage, good LVF ?right heart results, started on O2, BiPAP, and Revatio but he does not know why?  He notes min cough when supine & in early AM attributed to reflux; min if any sput production, no hemoptysis, he denies SOB but states DOE "if I over-exert" eg- walking, lifting/carrying, stairs, etc; he notes that ADLs are ok- no problem (he is stoic);  He denies CP, palpit, f/c/s, edema... He hasn't been  to an ER since 2007 he says & that was also the last time he had any Pred; he thinks he had CXR, PFT, 2DEcho all earlier this yr...   Smoking Hx>  He is an ex-smoker, started in his teens, smoked for 30 yrs up to 1ppd, quit in 1986; This is a 30 pack-yr hx, he does not recall ever being checked for A1AT defic...  Pulmonary Hx>  COPD/ Emphysema w/ right upper lobe "bleb[place]) [place] hypoxemic resp failure on O2 at 2L/min since 2007;  He tells me that he was started on BiPAP about that same time but he doesn't know why- never had sleep study, not on CPAP prev, he does not know about pCO2 levels etc ("I like the fresh cool air");  His BiPAP came from [facility] [place] he does not know the settings, machine never downloaded, etc;  He has also been on Revatio20Tid since 2007, apparently never tried on other meds, dose never adjusted- he knows about "pulmonary hypertension" but he doesn't know any details and it doesn't appear to have been followed up, and meds kept the same from doctor to doctor...   Medical Hx>  HBP, ?nonobstructive CAD, HL, thyroid nodule, GERD, constipation, BPH, insomnia...  Family Hx>  Father died w/ Emphysema &  was a former smoker; no other hx lung dis in the family; Alpha-1 status is unknown...  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   Current Meds>  Oxygen 2L/min pulse-dose concentrator, Advair250Bid, Spiriva daily, Revatio20Tid, CardizemCD240, Crestor10, Nexium20, Proscar5, Restoril30...  EXAM shows Afeb, VSS, O2sat=93% on 2L/min pulse-dose;  Heent- neg, mallampati1;  Chest- decr BS at bases, can't augment BS voluntarily, w/o w/r/r;  Heart- RR w/o m/r/g;  Abd- soft, neg;  Ext- neg w/o c/c/e;  Neuro- intact...  CXR 10/02/15 showed norm heart size,  COPD, bullous emphysema/ hyperinflation, scarring right apex, NAD...   Spirometry 10/02/15 showed FVC=2.70 (69%), FEV1=1.12 (37%), %1sec=41, mid-flows reduced at 18% predicted; this is c/w severe airflow obstruction & GOLD Stage 3 COPD  Ambulatory oxygen saturation test 10/02/15> on O2 at 2L/min pulse-dose concentrator: O2sat=96% on 2L at rest w/ pulse=87; he walked 2 laps w/ his O2, stopped due to dyspnea, lowest O2sat=89% w/ pulse=113/min...  LAB 10/02/15>  Alpha-1-Antitrypsin level => pending (he never went to the lab for this blood test)  2DEchocardiogram 10/09/15 showed norm LVF w/ EF=55-60%, norm wall motion, mild MR, mild RA dil, PAsys est 55mHg... Pt on Revatio 20Tid x 940yr& I rec we wean slowly (Decr to Bid now)...    IMP >>     COPD/ bullous emphysema> s/p RUL "bleb-ectomy" in 2007, severe airflow obstruction w/ GOLD Stage3 COPD> on Advair250Bid & Spiriva daily; apparently he has no use for a rescue inhaler; prev on NEB w/ Albut but not for several yrs.     Chronic hypoxemic respiratory failure on O2 at 2L/min via pulse-dose concentrator...    Pt reports using BiPAP since 2007, never been on CPAP, never had sleep study he says, unknown ABGs or pCO2 data; machine from [facility] will try to get the old data => none received.    Hx pulmonary hypertension on Revatio20Tid since 2007 w/o additional med trials or dose adjustments> we do not have any of the objective data from his prev physician teams... Current 2DEcho w/ PAsys est 3629m & we will wean the Revatio to Bid at this point => he does not want to wean further for "other" reasons...    Medical issues include:  HBP, ?nonobstructive CAD, HL, thyroid nodule, GERD, BPH, insomnia... PLAN >>     [name] a distinctly patchy history to go along w/ his severe airflow obstruction & bullous emphysema;  We really need his old objective data from 2007 when he was started on O2 & BiPAP after his RUL bleb reduction surg;  He will try to get  names and numbers for us-[place] the meanwhile we will [facility] their more recent data as we [place] that he wants us [place] continue his current regimen which has served him well over the last 49yr149yrContinue Advair250Bid, Spiriva daily, Revatio20Tid=>Bid, O2 at 2L/min, and the BiPAP nightly as currently set... We plan ROV recheck in 1 month... NOTE> 2DEcho w/ PAsys est ~36mm88m we will slowly wean Revatio...  ~  November 01, 2015:  61mo R82mo MrBowman reports stable, doing satis & notes no untoward effects from cutting the Revatio to 20mgBi47me denies CP, palpit, incr SOB, edema, etc; he  continues on the O2 at 2L/min, BiPAP from [place], [place] once daily; we have not received any records from his mult physicians ([place], [place], [place])...    EXAM shows Afeb, VSS, O2sat=92% on 2L/min pulse-dose;  Heent- neg, mallampati1;  Chest- decr BS at bases, can't augment BS voluntarily, w/o w/r/r;  Heart- RR w/o m/r/g;  Abd- soft, neg;  Ext- neg w/o c/c/e;  Neuro- intact... IMP/PLAN>>  See prob list above- he is stable on this regimen but does not want to wean the Revatio further for "other" reasons; OK to continue current med regimen- advised regular exercise vs pulm rehab program; given ZPak for prn use over the winter & he knows to call for any resp issues, incr dyspnea, etc... He remains on BiPAP but we do not have any data- no records received from prev pulm physicians, no notes from [place], no download data from his machine- we will again try to make contact w/ his DME company... We plan ROV in 3-67mo  ~  Mar 18, 2016:  4-542moOV & post hosp visit>  MaYeseniaas severe COPD/Emphysema, GOLD Stage 3 w/ FEV1=1.12L (37%predicted) in [id number] Hx right thoracotomy & "bleb-ectomy" in 2007;  after this procedure he says he was placed on Oxygen at 2L/min and BiPAP to use at night, along w/ ADVAIR250Bid, SPIRIVA  daily, and REVATIO20Tid for pulmonary HTN (2DEcho here 11/16 showed PAsys est=3643m)-- we do not have any of that data from [place] have been unsuccessful in obtaining old data from any of his prev physicians); he has been resistent to any adjustment in his medication regimen for various reasons...     He's been [facility] since [facility]> [facility] by [facility] w/ CAP- CXR showed severe bullous emphysema, incr markings in RLL and atelectasis in R-mid lung, Temp 103, Lactate=2.4, WBC 16K; treated w/ O2, Solumedrol=>Pred, Zosyn/Vanco=>Levaquin, NEBs, etc; disch home w/ home health help- ?seen by his PCP after disch...    [facility] w/ 1d hx incr SOB, wheezing, productive cough & felt to have a COPD exac; CXR showed his COPE/E, persistent incr markings in right base, WBC was elev at 20K, BNP=60, no pos cultures; he was treaed w/ O2, Solumedrol, Roceph/Zithro, NEBs, etc; he was disch on Levaquin & Pred; he developed urinary retention & foley placed (weaned off in NH); he was debilitated & sent to NH for rehab...     [facility] & attended by PieSunGardte dated 03/01/16 reviewed-- finished Levaquin, weaned off the Pred, they were able to discontinue the foley & he passed voiding trial; disch home after 19d in rehab...     Now back home on same O2= 2L/min days & 3L/min night w/ BiPAP ?settings (he has been on this for yrs and never re-assessed), NEB w/ AlbutTid, Advair250Bid, Spiriva daily, Revatio20Bid (pt refuses to taper this med further);  He has developed pedal edema x3d & needs a low sodium diet + diuretic started but he tells me he is sched to see his PCP soon for this problem...    EXAM shows Afeb, VSS, O2sat=95% on 2L/min pulse-dose;  Heent- neg, mallampati1;  Chest- decr BS at bases, can't augment BS voluntarily, w/o w/r/r;  Heart- RR w/o m/r/g;  Abd- soft, neg;  Ext- neg w/o c/c/e;  Neuro- intact...  CXR 02/11/16 showed  hyperinflation, bullous emphysema, some scarring in right mid lung & both lower lobes, no  infiltrate, no edema...  LABS 01/2016> all reviewed in Epic... IMP/PLAN>>  Damier has had a protracted resp exac- triggered by prob RLL pneumonia (NOS) superimposed on his severe COPD/bullous emphysema;  Rec to change the Albut for Neb to [place] & continue treatments Tid; continue other meds regularly as outlined;  He is referred to [facility] & hopes to start soon;  We will plan ROV in 24mosooner if needed.  ~  June 20, 2016:  362moOV w/ SN>  Marv returns for a 49m39moV & states that he is doing very well- no new complaints or concerns at this time, his PCP is DrKoirala; [facility] & continues in this program at present; Pt prev inquired about treatments at "[facility] regenerative lung tissue (stem cell therapy) which costs ~$10K per treatment & all benefits are antecdotal; I offered [facility] if he wants cutting edge research approach...     EPIC records indicate ER visit 05/08/16 for Abd Pain> VSS, exam was neg, CT Abd showed large fecal burden otherw neg, distended urinary bladder, atherosclerosis, bilat L5 pars defects; Rec to take laxatives...     Severe COPD- GOLD Stage3, bullous emphysema, s/p resection of RUL bleb in 2007> on Advair250Bid & Spiriva daily; he has NEB w/ Duoneb for prn use; Spirometry 09/2015 w/ FEV1=1.12 (37%); he is enrolled in PulOhiowants a POC instead of tanks;     Chronic hypoxemic respiratory failure on O2 at 2L/min via POC> ambulatory O2sats 09/2015 dropped to 89% on 2L/min after 2 Laps.    Hx prob hypercarbic resp failure inferred from Pt report of BiPAP ([facility]) since 2007, never been on CPAP, never had sleep study, unknown ABGs or pCO2 data>    Hx cor pulmonale/ pulmonary hypertension on Revatio20Tid since 2007 w/o additional med trials or dose adjustments> we do not have any of the objective data from his prev  physician teams; 2DEcho here 09/2015 showed PAsys=36 and we tried to wean his Revatio but he refused due to "other reasons", finally compromised on Revatio20Bid...    Hx CAP- Hosp 12/2015 w/ RLL opac (nos) & responded to broad spectrum Ab coverage + Sulomedrol, O2, Nebs, etc; readmitted 01/2016 w/ COPD exac- similar Rx but sent to NH for rehab at disch...    Cardiac issues>  ?nonobstructive CAD, cor pulmonale w/ mild pulmHTN & 2DEcho 09/2015 showing norm LVF w/ EF=55-60%, norm wall motion, mild MR, mild RA dil, PAsys est 67m38m..    Medical issues include:  HBP (on CardizemCD240), HL (on Cres10), hx thyroid nodule, GERD (on Nexium40), Constipation, BPH (on Proscar5 & Flomax0.4) w/ hx urinary retention, insomnia (on Restoril30)... EXAM shows Afeb, VSS, O2sat=93% on 2L/min pulse-dose;  Heent- neg, mallampati1;  Chest- decr BS at bases, can't augment BS voluntarily, w/o w/r/r;  Heart- RR w/o m/r/g;  Abd- soft, neg;  Ext- neg w/o c/c/e;  Neuro- intact...  CXR 05/08/16>  Mod bullous emphysema w/ chr changes at the lung bases w/ pleuroparenchymal scarring, surg suture lines ove the right mid lung...  LABS 04/2016 in epic> Chems- ok, BS=149;  CBC- anemia w/ Hg= 10.4-11.7, WBC=15K IMP/PLAN>>  Marv is stable on his baseline regimen + pulm rehab, etc; rec to continue current meds and exercise; he needs the 2017 Flu vaccine when avail & will call prn any breathing problems; we plan ROV in 28mo.67mo ADDENDUM>>  Pt Hosp by [facility] 8/11 - 07/01/16 w/  CAP after presenting w/ incr SOB, decr O2sats at pulm rehab, & chills 9he lives w/ school aged grandkids)- CXR showed ?RLL opac, cultures neg & NOS- treated empirically w/ Rocephin & Zithromax, Solumedrol, O2, NEBS, etc; he improved gradually & disch home;  He went to see ENT 8/29 because he thought the pneumonia may have been from reflux & dysphagia- fiberoptic exam revealed some edema & pooling of secretions c/w LPR=> placed on PPI Bid and referred to GI;  Barium esophagram  showed a [place] & mod GERD but no esophagitis mass or stricture;  He saw DrSchooler & had EGD 08/23/16 showing a Zebulon, patchy candida=> treated w/ Nystatin, later required Diflucan...   ADDENDUM>>  Pt had colonoscopy 08/23/16 by DrSchooler-- small HH, patchy candida, mild gastitis seen;  Treated w/ Nystatin, continue Prilosec40/d...   ~  November 25, 2016:  78moROV w/ SN>  Marv returns c/o 3d hx cough, lots of clear whitish sput, increased weakness and SOB "even on my oxygen"; symptoms started w/ a slight sore throat, hoarse, cough as noted but no f/c/s; he has been going to the Gym 5d per week to continue his exercise since graduating from the formal pulm rehab program (finished 07/2016) & is doing very well by his estimate (until this URI); he notes that 3d ago he had his usual 2H work out, went to tWellsite geologist then went to lunch & did all this w/o difficulty...      Severe COPD- GOLD Stage3, bullous emphysema, s/p resection of RUL bleb in 2007> on Advair250Bid & Spiriva daily; he has NEB w/ Duoneb for prn use; Spirometry 09/2015 w/ FEV1=1.12 (37%); he finished his PulmRehab 07/2016 & now exercises 5d/wk at the gym; he finally got his POC that he wanted...    Chronic hypoxemic respiratory failure on O2 at 2L/min via POC> ambulatory O2sats 09/2015 dropped to 89% on 2L/min after 2 Laps; repeat 11/2016 dropped to 85%after 1Lap...    Hx prob hypercarbic resp failure inferred from Pt report of BiPAP ([facility]) since 2007, never been on CPAP, never had sleep study, unknown ABGs or pCO2 data> we never received records from former physician teams or any of his old records...     Hx cor pulmonale/ pulmonary hypertension on Revatio20Tid since 2007 w/o additional med trials or dose adjustments> we do not have any of the objective data from his prev physician teams; 2DEcho here 09/2015 showed PAsys=36 and we tried to wean his Revatio but he refused due to "other reasons", finally compromised on Revatio20Bid...    Hx CAP-  Hosp 12/2015 w/ RLL opac (nos) & responded to broad spectrum Ab coverage + Solumedrol, O2, Nebs, etc; readmitted 01/2016 w/ COPD exac- similar Rx but sent to NH for rehab at disch...    Cardiac issues>  ?nonobstructive CAD, cor pulmonale w/ mild pulmHTN & 2DEcho 09/2015 showing norm LVF w/ EF=55-60%, norm wall motion, mild MR, mild RA dil, PAsys est 372mg...    Medical issues include:  HBP (on CardizemCD240), HL (on Cres10), hx thyroid nodule, GERD & prob LPR (on Nexium40), Constipation, BPH (on Proscar5 & Flomax0.4) w/ hx urinary retention, insomnia (on Restoril30)...       NOTE> pt is Iron deficient (see below) & needs to f/u w/ his PCP & GI for this...  EXAM shows Afeb, VSS, O2sat=92% on 3L/min;  Heent- neg, mallampati1;  Chest- decr BS at bases, can't augment BS voluntarily, w/o w/r/r;  Heart- RR w/o m/r/g;  Abd- soft, neg;  Ext- neg w/o  c/c/e;  Neuro- intact...  CXR 06/28/16>  Hyperinflation, marked emphysematous changes, crowed lung markings at the bases, no consolidation or effusions...  CXR 11/25/16>  COPD, bullous emphysema, and bilat pleuroparenchymal changes c/w scarring, no acute abnormality  Ambulatory O2sat on 3L/min pulse-dose POC>  O2sat=98% on 3L/min pulse-dose at rest w/ HR=90/min;  He ambulated only 1Lap (185') w/ O2sat drop to 85% w/ HR=108/min...  LABS 11/25/16>  Chems- wnl x BS=108;  A1c=6.1;  CBC- Hg=12.3, mcv=90, WBC=7.8K;  Fe=31 (6.6%sat) & Ferritin=8.7;  B12=661 IMP/PLAN>>  There is no such thing as a mild exac for MrBowman- even the mildest of URIs can cause a severe COPD exac- we discussed Rx w/ Levaquin500 x7d, Pred35m- 5d taper (see AVS), + Diflucan100 per his request; hopefully he will respond but he has little in the way of reversible factors given his severe emphysema; [facility] if symptoms worsen despite therapy; as noted he is Fe defic & needs further eval per his PCP & GI- copies sent...  ~  December 23, 2016:  136moOV & post hospital  check>  [facility] by TrDiona Browner/ a COPD exac w/ acute on chronic resp failure;  Pt already on Home O2 at 2L/min and BiPAP Qhs; he had a root canal done, developed a sore throat, then cough, etc; Levaquin & Pred called -in for pt, Hosp for 4d w/ IV solumedrol & ultimately disch on Pred10... Currently c/o DOE w/ ADLs which is new to him as prev he was going to the gym etc...     He is currently taking Duoneb Q6H as needed + Advair250Bid, Spiriva via handihaler daily, Pred 1070mabs- slow taper... EXAM shows Afeb, VSS, O2sat=94% on 2L/min;  Heent- neg, mallampati1;  Chest- decr BS at bases, can't augment BS voluntarily, w/o w/r/r;  Heart- RR w/o m/r/g;  Abd- soft, neg;  Ext- neg w/o c/c/e;  Neuro- intact...  CXR 12/07/16 (independently reviewed by me in the PACS system)> norm heart size, calcif Ao, severe COPD w/ apical bullous changes & surg staples on right, NAD... Marland KitchenMarland KitchenKG 12/07/16 showed NSR, rate 87, poor R prog V1-2, otherw WNL... Marland KitchenMarland KitchenABS 11/2016> NOT retaining CO2 (ABG w/ pCO2=39;  Chems- ok;  CBC- ok w/ Hg=11-12 and wbc=21=>12;  BNP= wnl... IMP/PLAN>>  Marv had a COPD exac & required IV Solumedrol + in hosp attention w/ NEBS etc & now he is approaching his baseline; he called for an order for a Hosp bed but was referred to his PCP for this determination;  We reviewed the NEED for DUONEB Tid followed by Advair250Bid & Spiriva once daily; we will wean his Pred from 5m70mto 5mg/51m plan ROV receheck in 6wks...  NOTE: >50% of this 25min53m was spent in counseling & coordination of care...  ~  January 28, 2017:  6wk ROV & [place] on meds + Pred 5mg/d;9me called 01/15/17 for antibiotics for an infected tooth- called in Augmentin & tooth has been extracted by dentist;  He remains on NEBs w/ Duoneb Tid, followed by Advair250Bid and Spiriva daily;  He reports using his Revatio "prn";  His breathing continues to improve, back in the gym 3d/wk, approaching his baseline he says;  Denies  cough, sput, hemoptysis, CP, etc... We reviewed his prob list as above...  EXAM shows Afeb, VSS, O2sat=96% on 2L/min;  Heent- neg, mallampati1;  Chest- decr BS at bases, can't augment BS voluntarily, w/o  w/r/r;  Heart- RR w/o m/r/g;  Abd- soft, neg;  Ext- neg w/o c/c/e;  Neuro- intact... IMP/PLAN>>  Marv continues to improve & move toward his baseline; Rec to continue Pred27m/d thru March then decr to 549mQod til return visit in 41m18mo  ~  May 01, 2017:  448mo7448mo & Marv reports a good 448mo 57morval w/o acute exac and w/o new complaints or concerns; he is back in the gym 3d/wk & back to baseline he says;  As prev noted Marv is on BiPAP & has been for yrs- [place] & I presume this was done for COPD & CO2 retention ant NOT because of sleep apnea; he does not know his settings 7 we have been unable to get old records indicating his initial parameters or follow up monitoring; cureent problem is that his machine is old, mask is 53yr o12yrno tubing supplies in a long time;  For his part he says he does NOT rest well, just in short intervals;  He goes to bed at 11PM using his BiPAP; wakes at 3-4AM & can't get back to sleep; maybe eats breakfast at 4AM and drifts back to sleep (w/o BiPAP) until 7AM or so; similarly he might nap for 1H during the day (w/o BiPAP); he currently thinks that the BiPAP makes no difference in his sleep & it would just as well suit him to stop it if able...  WE DISCUSSED REASSESSMENT w/ ABG on RA, and a HOME SLEEP TEST on RA to evaluate for sleep apnea and hypercarbic/ hypoxemic resp failure...     [facility] does his Home O2 (2L/min continuous) and [facility] does his nursing care    Marv also tells me he had a suspicious mole removed from his left post shoulder ~48mo ag69mox= melanoma, then wider excision by DERM[place] Severe COPD- GOLD Stage3, bullous emphysema, s/p resection of RUL bleb in 2007> on Advair250Bid & Spiriva  daily; he has NEB w/ Duoneb for prn use; he is off PRED now; Spirometry 09/2015 w/ FEV1=1.12 (37%); he finished his PulmRehab 07/2016 & now exercises regularly at the gym...    Chronic hypoxemic respiratory failure on O2 at 2L/min via POC> ambulatory O2sats 09/2015 dropped to 89% on 2L/min after 2 Laps; repeat 11/2016 dropped to 85% after 1Lap...    Hx prob hypercarbic resp failure inferred from Pt report of BiPAP ([facility]) since 2007, never been on CPAP, never had sleep study> we never received records from former physician teams or any of his old records; ABGs in EPIC> [facility] pH=7.44/ pCO2=35/ pO2=69 ?on RA; 11/2016 pH=7.44/ pCO2=39/ pO2=146 on 4Lnc;   => he has agreed to re-assessment here 2018 w/ ABG on RA and Home Sleep Study => pending...    Hx cor pulmonale/ pulmonary hypertension on Revatio20Tid since 2007 w/o additional med trials or dose adjustments> we do not have any of the objective data from his prev physician teams; 2DEcho here 09/2015 showed PAsys=36 and we tried to wean his Revatio but he refused due to "other reasons", finally compromised on Revatio20Bid...    Hx CAP- Hosp 12/2015 w/ RLL opac (nos) & responded to broad spectrum Ab coverage + Solumedrol, O2, Nebs, etc; readmitted 01/2016 w/ COPD exac- similar Rx but sent to NH for rehab at disch...    Cardiac issues> no cardiologist> ?nonobstructive CAD, cor pulmonale w/ mild pulmHTN & 2DEcho 09/2015 showing norm LVF w/ EF=55-60%, norm  wall motion, mild MR, mild RA dil, PAsys est 44mHg...    Medical issues> PCP= Dr. DLauretta GrillKoirala ([facility])>  HBP (on CardizemCD240), HL (on Cres10), hx thyroid nodule, GERD & prob LPR (GI= DrSchooler, on Prilosec40), Constipation, BPH (on Proscar5 & Flomax0.4) w/ hx urinary retention, insomnia (on Restoril30)...       NOTE> pt is Iron deficient & needs to f/u w/ his PCP & GI for this>> LABS 11/25/16>  Chems- wnl x BS=108;  A1c=6.1;  CBC- Hg=12.3, mcv=90, WBC=7.8K;  Fe=31 (6.6%sat) & Ferritin=8.7;   B12=661 EXAM shows Afeb, VSS, O2sat=90% on 2L/min pulse dose;  Heent- neg, mallampati1;  Chest- decr BS at bases, can't augment BS voluntarily, w/o w/r/r;  Heart- RR w/o m/r/g;  Abd- soft, neg;  Ext- neg w/o c/c/e;  Neuro- intact...  ABG on RA> pending (not done)  Home Sleep Test> pending (see below) IMP/PLAN>>  Marv has agreed to proceed w/ home sleep test to r/o OSA and we will further assess w/ ABG on RA to check for CO2 retention and any indication for BiPAP (doubt he will qualify at this time);  He will continue w/ his O2, Advair250, Spiriva, Duoneb vs ProventilHFA which he uses prn;  He does not appear to need the Revatio either but declines to discontinue this medication for other reasons.  ADDENDUM>>  Home Sleep Test done 05/20/17>  4H study showed mild OSA w/ AHI=6.0/hr;  Study done on RA-- lowest O2sat was 74% w/ an ave of 87%;  Pt given the option for in-lab CPAP titration study vs trial CPAP w/ autoset & a mask fit session w/ VLynnae Sandhoff  He prefers the latter & states he doesn't believe that he has OSA => we will arrange a mask fit session, the order a ResMed S10, air/ auto- set 5/15, w/ heated humidity, cliamate controlled tubing, enroll in airview w/ ROV in 6-8wks after starting for face to face and download... NOTE: he had the mask fit session w/ new FFM & O2 bleed-in at 3L/min; he was not willing to try the CPAP on Auto, says he requires BiPAP & therefore will need an in-lab CPAP/BiPAP titration test...  ~  July 31, 2017:  3676moOV & Marv had a Home Sleep Test 05/20/17- as above w/ AHI=6/hr & O2desat to 74% w/ ave 87%;  As noted he has been on BiPAP x yrs [place] been [place] to obtain any old records relating to his prev Dx of OSA & his need for BiPAP;  It is indeed unfortunate that he did not travel [place]/ [place] that he was INTOL to CPAP when tried  in the past;  The prob is that his old machine is wearing out & he is in need of a new machine- he is unable to afford one on his own;  I explained Medicare's rules for CPAP/ BiPAP & how he must follow their edicts about the repeat eval, and eventually the requirement for a download to prove compliance & efficacy-- in essence this means that he MUST have an in lab CPAP/BiPAP titration test, prove INTOL to CPAP to get the BiPAP then determine the BiPAP settings best for him;  This process is followed by a face to face visit in ~76m15mor download purposes...    NOTE: his PCP is DrKoirala ([employer], multimedia his GI is [facility]);  Pt saw ENT- Nordbladh,PA at [facility] on 06/24/17> c/o worsening dysphagia & nasal congestion, long hx GERD on PPI therapy, larynx exam showed polypoid changes of the cords, prom cricopharyngeus, and postglottic edema; prev EGD w/ smallHH, mod GERD; repeat laryngoscopy was essent neg- they ordered a Ba swallow, done 06/30/17 & showed sl retention in the valleculae that cleared w/ repeat swallowing, 67m barium tablet got lodged in the distal esoph & did not pass into the stomach; He again needs attention from his gastroenterologist for dilatation...  EXAM shows Afeb, VSS, O2sat=90% on 2L/min pulse dose;  Heent- neg, mallampati1;  Chest- decr BS at bases, can't augment BS voluntarily, w/o w/r/r;  Heart- RR w/o m/r/g;  Abd- soft, neg;  Ext- neg w/o c/c/e;  Neuro- intact... IMP/PLAN>>  As explained above we will order an in-lab CPAP/BiPAP titration study, and proceed from there...  ADDENDUM>>  Sleep Lab Titration study 10.18.18>  Optimal pressure= 7cmH2O w/ AHI at optimal pressure= 0/hr;  The study lasted 6.5H, no cardiac arrhythmias, no leg movements, he had O2desat lasting longer than 585m despite control in his sleep apnea w/ CPAP and 1L/min O2 was added to his CPAP;  He uses a small [employer] full face mask & heated humidification...  ~  September 22, 2017:  34m31moV & post-  hosp check>  [facility] w/ 4d hx incr cough, green sput, temp to 102 & incr SOB; ER eval revealed right basilar pneumonia, WBC=14.2, & O2sat=87% on 2L/min by [place];  He was treated w/ Zithromax & Rocephin, Solumedrol, Nebs, etc;  Cultures and serologies were all non-revealing & he was disch on Keflex, Pred, Nebs (Xopenex & Ipratripium), etc;  He also had PAF which converted spont & he was seen by [facility] and disch on Eliquis, Cardizem, Metoprolol... [facility]  We reviewed the following medical problems during today's office visit>    Severe COPD- GOLD Stage3, bullous emphysema, s/p resection of RUL bleb in 2007> on Advair250Bid & Spiriva daily; he has NEB w/  Xopenex & Atrovent for prn use; he is off PRED now; Spirometry 09/2015 w/ FEV1=1.12 (37%); he finished his PulmRehab 07/2016 & now exercises regularly at the gym => we re-started Pred 74m33mpering sched down to 1/2 Qod by return visit...    Chronic hypoxemic respiratory failure on O2 at 2L/min via POC> ambulatory O2sats 09/2015 dropped to 89% on 2L/min after 2 Laps; repeat 11/2016 dropped to 85% after 1Lap... He uses O2 at 1L/min Qhs and 2L/min w/ activ.    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    Hx cor pulmonale/ pulmonary hypertension on Revatio20Tid since 2007 w/o additional med trials or dose adjustments> we do not have any of the objective data from his prev physician teams; 2DEcho here 09/2015 showed PAsys=36 and we tried to wean his Revatio but he refused due to "other reasons", finally compromised on Revatio20Bid...    Hx CAP- Hosp 2/17 & 10/18 w/  RLL opac (nos) & responded to broad spectrum Ab coverage + Solumedrol, O2, Nebs, etc; readmitted 01/2016 w/ COPD exac- similar Rx but  sent to NH for rehab at [place]...    Cardiac issues> ?nonobstructive CAD, cor pulmonale w/ mild pulmHTN & 2DEcho 09/2015 showing norm LVF w/ EF=55-60%, norm wall motion, mild MR, mild RA dil, PAsys est 73mHg...episode PAP 10/18 & converted spont- disch from hosp on Eliquis, Metoprolol, Cardizem & f/u w/ [facility]-DrRandolph pending...    Medical issues> PCP= Dr. DLauretta GrillKoirala ([facility])>  HBP (on CardizemCD240), HL (on Cres10), hx thyroid nodule, GERD & prob LPR (GI= DrSchooler, on Prilosec40), Constipation, BPH (on Proscar5 & Flomax0.4) w/ hx urinary retention, anemia & insomnia (on Restoril30)... EXAM shows Afeb, VSS, O2sat=90% on 2L/min pulse dose;  Heent- neg, mallampati1;  Chest- decr BS at bases, can't augment BS voluntarily, w/o w/r/r;  Heart- RR w/o m/r/g;  Abd- soft, neg;  Ext- neg w/o c/c/e;  Neuro- intact...  CXR 09/09/17>  Norm heart size, interval patchy opac & effusion at right base, underlying COPD...  2DEcho 09/10/17>  Norm LV size & function w/ EF=55-60%, no regional wall motion abn, valves are OK, PAsys=334mg  Mod barium swallow 09/10/17> mild pharyngeal dysphagia due to known esoph issues, mild asp risk, swallowing strategies outlined to pt...  LABS in Epic 08/2017>  Chems- ok BS~130, Cr=0.81, Alb=2.7, sl elev LFTs noted;  CBC- anemic w/ Hg=10.9;  TSH=0.52...   CXR 09/22/17 (independently reviewed by me in the PACS system) showed norm heart size, underlying COPD/emphysema, near complete clearing of the right basilar infiltrate... IMP/PLAN>>  We decided to continue his low dose Pred rx w/ 60m48mod;  Continue other meds as prev including NEBs w/ Duoneb Tid followed by Advair250Bid & Spiriva once daily, Mucinex 1-2Bid, Tessalon tid as needed... We plan rov recheck in 6 wks...  ~  November 04, 2017:  6wk [place]towne improved and approaching his  baseline- still w/ SOB/DOE but ADLs improved, cough/ phlegm stable, no CP/ edema; on Duoneb tid, Advair250Bid & spiriva daily; on Mucinex, MMW, Tessalon as needed; Pred weaned to 1/2 Qod;  ?hold-up from LinAlphar his new ResMed S10 CPAP set up as above...     He saw [facility]-DrRandolph 09/22/17>  HBP, PAF, HL;  On Eliquis, Metoprolol, Cardizem; Thyroid wnl & Echo w/ EF=55-60%, PAsys=33; felt to be stable- no changes made...     We reviewed his pulm issues/ medical problems during today's office visit-- see above... we do not have notes/ data from [facility]... EXAM shows Afeb, VSS, O2sat=96% on 2L/min pulse dose;  Heent- neg, mallampati1;  Chest- decr BS at bases, can't augment BS voluntarily, w/o w/r/r;  Heart- RR w/o m/r/g;  Abd- soft, neg;  Ext- neg w/o c/c/e;  Neuro- intact... IMP/PLAN>>  We discussed slowly weaning the Pred down to 60mg41md & continue this til ROV in 91mo;67mo will watch BP/ pulse state w/ an eye towards weaning the BBlocker if able;  His appetite is improved, wt up 7#, and back in the gym daily!  ~  December 16, 2017:  6wk ROV & add-on appt requested for cough, yellow sput, dyspnea>  Marv weaned the Pred to 60mg Q66m& then noticed grad increase in symptoms- cough w/ yellow sput, incr SOB w/ min exertion, etc;  He had ret to the gym & was doing satis until this deterioration;  He uses his CPAP Qhs- rests well, wakes feeling rested, denies daytime hypersomnolence but still naps 2-3/7, states driving is ok w/o drowsiness etc...     Severe COPD/ emphysema w/ bullous dis & prev RUL bleb  resected 2007> on Advair115 & Spiriva daily + Xopenex&Atrovent via NEB prn, and Pred5Qod...    Chr hypoxemic resp failure on O2 at 2L/min via POC...    OSA- on CPAP7 & 1L/min O2 bleed-in.Marland KitchenMarland Kitchen Download 12/30-1/28/19 showed good compliance (30/30d, ave 7+H per night, on CPAP7 w/ min intermit leak, & good efficacy w/ AHI=1.4.Marland KitchenMarland Kitchen     Hx cor pulmonale/ pulmonary hypertension on Revatio20Tid since  2007 w/o additional med trials or dose adjustments=> we cut him to bid w/o any untoward effect but he refused to wean further... EXAM shows Afeb, VSS, O2sat=90% on 2L/min pulse dose;  Heent- neg, mallampati1;  Chest- decr BS at bases, can't augment BS voluntarily, w/o w/r/r;  Heart- RR w/o m/r/g;  Abd- soft, neg;  Ext- neg w/o c/c/e;  Neuro- intact... IMP/PLAN>>  We decided to treat his COPD exac w/ DOXY 145m Bid, and an incr Pred 530mtabs to 2tabs Qam for 1wk, then taper to 2-1 Qod, then 2-0 Qod til return;  Asked to continue NEBS Tid followed by Advair-Bid & Spiriva-Qd, + Mucinex600Bid, etc...    ~  February 02, 2018:  6wk ROV & post hospital follow up>  Marv was HOSP 3/2 - 01/21/18 by [facility] w/ a COPD exacerbation- presented w/ productive cough, wheezing, incr SOB, feeling bad w/ low grade fever; he was on Pred1049md, NEBS w/ Duoneb Tid, Advair250Bid, SpirivaQd, Mucinex, Fluids, etc=> in Hosp eval revealed no acute CXR changes, Hg=11.2=>9.0, WBC=12.5, Chems- ok, cultures all neg (Flu neg, Legionella neg, Pneumococcal neg); he had been exposed to Flu & covered w/ Tamiflu rx; given Rocephin/ Zithromax/ IV Solumedrol=> disch on Pred taper...  We reviewed the following medical problems during today's office visit>    Severe COPD- GOLD Stage3, bullous emphysema, s/p resection of RUL bleb in 2007> on Pred 25m3m Advair250Bid & Spiriva daily; he has NEB w/ Duoneb to use Tid; Spirometry 09/2015 w/ FEV1=1.12 (37%); he finished his PulmRehab 07/2016 & now exercises regularly at the gym =>     Chronic hypoxemic respiratory failure on O2 at 2L/min via POC> ambulatory O2sats dropped on RA & he uses O2 at 1L/min Qhs and 2L/min w/ activ...    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     Hx cor pulmonale/ pulmonary hypertension on Revatio20Tid since 2007 w/o additional med trials or dose adjustments> we do not have any of the objective data from his prev physician teams; 2DEcho here 09/2015 showed PAsys=36 and we tried to wean his Revatio but he refused due to "other reasons", finally compromised on Revatio20Bid...    Hx CAP- Hosp 2/17 & 10/18 w/ RLL opac (nos) & responded to broad spectrum Ab coverage + Solumedrol, O2, Nebs, etc; readmitted 01/2016 w/ COPD exac- similar Rx but sent to NH for rehab at disch...    Cardiac issues> ?nonobstructive CAD, cor pulmonale w/ mild pulmHTN & 2DEcho 09/2015 showing norm LVF w/ EF=55-60%, norm wall motion, mild MR, mild RA dil, PAsys est 36mm97m.episode PAF 10/18 & converted spont- disch from hosp on Eliquis, Metoprolol, Cardizem & f/u w/ [facility]-DrRandolph & he has ventric bigeminy=> f/u pending...    Medical  issues> PCP= Dr. Lauretta Grill Koirala ([facility])>  HBP (on CardizemCD240), HL (on Cres10), hx thyroid nodule, GERD & prob LPR (GI= DrSchooler, on Prilosec40), Constipation, BPH (on Proscar5 & Flomax0.4) w/ hx urinary retention, Anemia w/ iron dific(PCP eval in progress) & insomnia (on Restoril30)... NOTE:  He's been anemic and Fe has been low- pt tells me that his PCP DrKoirala has done post-hosp blood work & is taking care of his anemia work-up! EXAM shows Afeb, VSS, O2sat=95% on 2L/min pulse dose;  Heent- neg, mallampati1;  Chest- decr BS at bases, can't augment BS voluntarily, w/o w/r/r;  Heart- RR w/o m/r/g;  Abd- soft, neg;  Ext- neg w/o c/c/e;  Neuro- intact...  EKG 02/02/18>  PVCs and ventric bigeminy => refer to cards for further eval & rx...   CXR 01/17/18>  Norm heart size, hyperinflation/ bullous emphysema (esp RUL), no acute abn...  LABS 01/2018 in Hosp>  Chems- ok x HCO3=20, BS=178, Cr=0.95, LFTs ok;   CBC- Hg 11.2=>9.0, WBC=10.2;   IMP/PLAN>>  Marv's pulse was recorded at 34 on arrival but this was because of his bigeminy- he is asymptomatic (doesn't note skips, irreg, fluttering, dizzy, etc); on Eliquis, Metoprolol, Cardizem and we will refer back to [facility] for further eval & Rx... REC to continue O2, Pred 25m/d + DUONEB, ADVAIR, SPIRIVA, Mucinex, fluids... We plan recheck in 135mo   Past Medical History:  Diagnosis Date  . BiPAP (biphasic positive airway pressure) dependence    Pt denies history of OSA  . BPH (benign prostatic hyperplasia)   . Cancer (HCAucilla   skin  . COPD (chronic obstructive pulmonary disease) ([facility]  . Dysphagia   . Emphysema lung ([facility]Stanly  . GERD (gastroesophageal reflux disease)    " Silent reflux"  . Hearing loss    right ear  . HLD (hyperlipidemia)   . Hypertension   . Oxygen dependent    2-3 liters  . Oxygen dependent   . Pneumonia   . Pulmonary hypertension (HCC)   Medical Hx>  HBP, ?nonobstructive CAD, HL, thyroid nodule, GERD, constipation, BPH, insomnia... Meds include>  CardizemCD240, Crestor10, Nexium20, Miralax, Proscar10, Restoril30...   Past Surgical History:  Procedure Laterality Date  . CARDIAC CATHETERIZATION    . CATARACT EXTRACTION W/ INTRAOCULAR LENS  IMPLANT, BILATERAL    . ESOPHAGOGASTRODUODENOSCOPY (EGD) WITH PROPOFOL N/A 08/23/2016   Procedure: ESOPHAGOGASTRODUODENOSCOPY (EGD) WITH PROPOFOL;  Surgeon: ViWilford CornerMD;  [facility];  Service: Endoscopy;  Laterality: N/A;  . LUNG SURGERY    . TONSILLECTOMY    [place]   Outpatient Encounter Medications as of 02/02/2018  Medication Sig  . ADVAIR DISKUS 250-50 MCG/DOSE AEPB Inhale 1 puff into the lungs 2 (two) times daily.  . Marland Kitchenlbuterol (PROVENTIL HFA;VENTOLIN HFA) 108 (90 Base) MCG/ACT inhaler Inhale 2 puffs into the lungs every 6 (six) hours as needed for wheezing or shortness of breath. (Patient taking differently:  Inhale 2 puffs into the lungs daily. )  . apixaban (ELIQUIS) 5 MG TABS tablet Take 1 tablet (5 mg total) by mouth 2 (two) times daily.  . benzonatate (TESSALON) 100 MG capsule Take 1 capsule (100 mg total) 3 (three) times daily by mouth. (Patient taking differently: Take 100 mg by mouth 2 (two) times daily. )  . DILT-XR 120 MG 24 hr capsule Take 120 mg by mouth at bedtime.  . finasteride (PROSCAR) 5 MG tablet Take 5 mg by mouth at bedtime.   . fluticasone (  FLONASE) 50 MCG/ACT nasal spray Place 2 sprays into the nose daily as needed.   Marland Kitchen guaiFENesin (MUCINEX) 600 MG 12 hr tablet Take 1 tablet (600 mg total) by mouth 2 (two) times daily. (Patient taking differently: Take 1,200 mg by mouth daily. )  . HYDROcodone-acetaminophen (NORCO/VICODIN) 5-325 MG tablet Take 1 tablet by mouth at bedtime as needed for moderate pain or severe pain.  . hydrocortisone cream 1 % Apply 1 application topically daily as needed (Eczema on face and head).  Marland Kitchen ipratropium-albuterol (DUONEB) 0.5-2.5 (3) MG/3ML SOLN Take 3 mLs by nebulization 3 (three) times daily.  . metoprolol tartrate (LOPRESSOR) 25 MG tablet Take 1 tablet (25 mg total) by mouth 2 (two) times daily.  . Multiple Vitamins-Minerals (MULTIVITAMIN WITH MINERALS) tablet Take 1 tablet by mouth daily.  Marland Kitchen omeprazole (PRILOSEC) 40 MG capsule Take 40 mg by mouth daily.  . OXYGEN Inhale 2-3 L into the lungs See admin instructions. 2L daytime, 3L at night   . polyethylene glycol (MIRALAX / GLYCOLAX) packet Take 17 g by mouth daily. (Patient taking differently: Take 17 g by mouth at bedtime. Mix in 8 oz liquid and drink)  . rosuvastatin (CRESTOR) 10 MG tablet Take 10 mg by mouth daily.  . sildenafil (REVATIO) 20 MG tablet Take 1 tablet (20 mg total) by mouth 2 (two) times daily.  Marland Kitchen SPIRIVA HANDIHALER 18 MCG inhalation capsule INHALE THE CONTENTS OF 1 CAPSULE EVERY DAY  . tamsulosin (FLOMAX) 0.4 MG CAPS capsule Take 1 capsule (0.4 mg total) by mouth daily after breakfast.  (Patient taking differently: Take 0.4 mg by mouth at bedtime. )  . temazepam (RESTORIL) 15 MG capsule Take 1 capsule (15 mg total) by mouth at bedtime as needed for sleep (1-2 tabs as needed).  . vitamin C (ASCORBIC ACID) 500 MG tablet Take 500 mg by mouth daily.  . predniSONE (DELTASONE) 10 MG tablet Take 1 tablet (10 mg total) by mouth daily. (Patient not taking: Reported on 02/02/2018)  . predniSONE (DELTASONE) 20 MG tablet Take 22m daily for 2days then 225mdaily for 2days then STOP (Patient not taking: Reported on 02/02/2018)  . [DISCONTINUED] azithromycin (ZITHROMAX) 250 MG tablet Take 1 tablet (250 mg total) by mouth daily. For 2days   No facility-administered encounter medications on file as of 02/02/2018.     No Known Allergies   Immunization History  Administered Date(s) Administered  . Influenza, High Dose Seasonal PF 07/22/2016, 08/18/2017  . Influenza-Unspecified 09/19/2015  . PPD Test 02/15/2016  . Pneumococcal Conjugate-13 09/19/2015  . Pneumococcal Polysaccharide-23 12/20/2016    Current Medications, Allergies, Past Medical History, Past Surgical History, Family History, and Social History were reviewed in [employer].   Review of Systems             All symptoms NEG except where BOLDED >>  Constitutional:  F/C/S, fatigue, anorexia, unexpected weight change. HEENT:  HA, visual changes, hearing loss, earache, nasal symptoms, sore throat, mouth sores, hoarseness. Resp:  cough, sputum, hemoptysis; SOB, tightness, wheezing. Cardio:  CP, palpit, DOE, orthopnea, edema. GI:  N/V/D/C, blood in stool; reflux, abd pain, distention, gas. GU:  dysuria, freq, urgency, hematuria, flank pain, voiding difficulty. MS:  joint pain, swelling, tenderness, decr ROM; neck pain, back pain, etc. Neuro:  HA, tremors, seizures, dizziness, syncope, weakness, numbness, gait abn. Skin:  suspicious lesions or skin rash. Heme:  adenopathy, bruising,  bleeding. Psyche:  confusion, agitation, sleep disturbance, hallucinations, anxiety, depression suicidal.   Objective:   Physical  Exam       Vital Signs:  Reviewed...   General:  WD, WN, 77 y/o WM in NAD; alert & oriented; pleasant & cooperative... HEENT:  [place]/AT; Conjunctiva- pink, Sclera- nonicteric, EOM-wnl, PERRLA, EACs-clear, TMs-wnl; NOSE-clear; THROAT-clear & wnl.  Neck:  Supple w/ fair ROM; no JVD; normal carotid impulses w/o bruits; no thyromegaly +small nodule palpated; no lymphadenopathy.  Chest:  Overinflated, resonant percussion note, decr BS bilat & can't augment BS voluntarily, no w/r/r heard... Heart:  Regular Rhythm; norm S1 & S2 without murmurs, rubs, or gallops detected. Abdomen:  Soft & nontender- no guarding or rebound; normal bowel sounds; no organomegaly or masses palpated. Ext:  decr ROM; without deformities or arthritic changes; no varicose veins, +venous insuffic, no edema;  Pulses intact w/o bruits. Neuro:  No focal neuro deficits; sensory testing normal; gait normal & balance OK. Derm:  No lesions noted; no rash etc. Lymph:  No cervical, supraclavicular, axillary, or inguinal adenopathy palpated.   Assessment:      IMP >>     Severe COPD- GOLD Stage3, bullous emphysema, s/p resection of RUL bleb in 2007> on Advair250Bid & Spiriva daily; he has NEB w/ Duoneb for prn use; Spirometry 09/2015 w/ FEV1=1.12 (37%); he has completed pulm rehab & now exercises at the gym 5d/wk;  He's had mult COPD exac => treat w/ Levaquin, Pred taper, continue Advair/ spiriva/ NEBS/ O2...    Chronic hypoxemic respiratory failure on O2 at 2L/min via POC> ambulatory O2sats 09/2015 dropped to 89% on 2L/min after 2 Laps; he dropped to 85% after 1Lap on 3L/min POC 11/2016...    Hx prob hypercarbic resp failure inferred from Pt report of BiPAP ([facility]) since 2007, never been on CPAP, never had sleep study, unknown ABGs or pCO2 data> we never received data from his prev physicians.    Hx cor  pulmonale/ pulmonary hypertension on Revatio20Tid since 2007 w/o additional med trials or dose adjustments> we do not have any of the objective data from his prev physician teams; 2DEcho here 09/2015 showed PAsys=36 and we tried to wean his Revatio but he refused due to "other reasons", finally compromised on Revatio20Bid...    Hx CAP- Hosp 12/2015 w/ RLL opac (nos) & responded to broad spectrum Ab coverage + Sulomedrol, O2, Nebs, etc; readmitted 01/2016 w/ COPD exac- similar Rx but sent to NH for rehab at disch...    Hx of being on Home BiPAP since 2007 w/ eval & set up in [place]> we have been unable to obtain any old records regarding his initial studies and BiPAP set up...    Cardiac issues>  ?nonobstructive CAD, cor pulmonale w/ mild pulmHTN & 2DEcho 09/2015 showing norm LVF w/ EF=55-60%, norm wall motion, mild MR, mild RA dil, PAsys est 53mHg...    Medical issues include:  HBP (on CardizemCD240), HL (on Cres10), hx thyroid nodule, GERD (on Nexium40), constipation, BPH (on Proscar5 & Flomax0.4) w/ hx urinary retention, insomnia (on Restoril30)...       NOTE>  He is Iron deficient & need further eval & Rx from his PCP & GI...  PLAN >>  10/02/15>   MJamariihas a distinctly patchy history to go along w/ his severe airflow obstruction & bullous emphysema;  We really need his old objective data from 2007 when he was started on O2 & BiPAP after his RUL bleb reduction surg;  He will try to get names and numbers for [place] in the meanwhile we [place] [facility]  for their more recent data as we establish out data base here in [place];  He is very concerned that he wants [place] to continue his current regimen which has served him well over the last 76yr;  Continue Advair250Bid, Spiriva daily, Revatio20tid, O2 at 2L/min, and the BiPAP nightly as currently set...  11/01/15>  See prob list above- he is stable on this regimen but does not want to wean the Revatio further for "other" reasons;  OK to continue current med regimen- advised regular exercise vs pulm rehab program; given ZPak for prn use over the winter & he knows to call for any resp issues, incr dyspnea, etc... He remains on BiPAP but we do not have any data- no records received from prev pulm physicians, [place] download data from his machine- we will again try to make contact w/ his DME company... 03/18/16>   MJamillehas had a protracted resp eWhetstonex2 this spring- triggered by prob RLL pneumonia (NOS) superimposed on his severe COPD/bullous emphysema;  Rec to change the [place] for Neb to [place]& continue treatments Tid; continue other meds regularly as outlined;  [facility];  We will plan ROV in 368moooner if needed. 06/20/16>   Marv is stable on his baseline regimen + pulm rehab, etc; rec to continue current meds and exercise; he needs the 2017 Flu vaccine when avail & will call prn any breathing problems; we plan ROV in 56m39mo  11/25/16>   There is no such thing as a mild exac for MrBowman- even the mildest of URIs can cause a severe COPD exac- we discussed Rx w/ Levaquin500 x7d, Pred20m11md taper (see AVS), + Diflucan100 per his request; hopefully he will respond but he has little in the way of reversible factors given his severe emphysema; [facility] worsen [facility] therapy; as noted he is Fe defic & needs further eval per his PCP & GI- copies sent. => subseq Hosp for 4d w/ IV Solumedrol, NEBs/ O2/ etc... 05/01/17>   Marv has agreed to proceed w/ home sleep test to r/o OSA and we will further assess w/ ABG on RA to check for CO2 retention and any indication for BiPAP (doubt he will qualify at this time);  He will continue w/ his O2, Advair250, Spiriva, Duoneb vs ProventilHFA which he uses prn;  He does not appear to need the Revatio either but declines to discontinue this medication for other reasons; 07/31/17>   As explained above  we will order an in-lab CPAP/BiPAP titration study, and proceed from there. 08/2017>   He was [facility] w/ RLL pneumonia (nos) and ac on chr hypoxemic resp failure; disch on NEBS, Advair, Spiriva, Pred + Eliquis, Cardizem, Metoprolol for his PAF episode...  09/22/17>   We decided to continue his low dose Pred rx w/ 5mg 756m;  Continue other meds as prev including NEBs w/ Duoneb Tid followed by Advair250Bid & Spiriva once daily, Mucinex 1-2Bid, Tessalon tid as needed... We plan rov recheck in 6 wks. 11/04/17>   We discussed slowly weaning the Pred down to 5mg Q41m& continue this til ROV in 36mo;  58moill watch BP/ pulse state w/ an eye towards weaning the BBlocker if able;  His appetite is improved, wt up 7#, and back in the gym daily! 12/16/17>   We decided to treat his COPD exac w/ DOXY  139m Bid, and an incr Pred 550mtabs to 2tabs Qam for 1wk, then taper to 2-1 Qod, then 2-0 Qod til return;  Asked to continue NEBS Tid followed by Advair-Bid & Spiriva-Qd, + Mucinex600Bid, etc. 02/02/18>   Marv's pulse was recorded at 34 on arrival but this was because of his bigeminy- he is asymptomatic (doesn't note skips, irreg, fluttering, dizzy, etc); on Eliquis, Metoprolol, Cardizem and we will refer back to [facility] for further eval & Rx... REC to continue O2, Pred 1072m + DUONEB, ADVAIR, SPIRIVA, Mucinex, fluids... We plan recheck in 62mo50molan:     Patient's Medications  New Prescriptions   No medications on file  Previous Medications   ADVAIR DISKUS 250-50 MCG/DOSE AEPB    Inhale 1 puff into the lungs 2 (two) times daily.   ALBUTEROL (PROVENTIL HFA;VENTOLIN HFA) 108 (90 BASE) MCG/ACT INHALER    Inhale 2 puffs into the lungs every 6 (six) hours as needed for wheezing or shortness of breath.   APIXABAN (ELIQUIS) 5 MG TABS TABLET    Take 1 tablet (5 mg total) by mouth 2 (two) times daily.   BENZONATATE (TESSALON) 100 MG CAPSULE    Take 1 capsule (100 mg total) 3 (three) times daily by mouth.   DILT-XR 120 MG 24  HR CAPSULE    Take 120 mg by mouth at bedtime.   FINASTERIDE (PROSCAR) 5 MG TABLET    Take 5 mg by mouth at bedtime.    FLUTICASONE (FLONASE) 50 MCG/ACT NASAL SPRAY    Place 2 sprays into the nose daily as needed.    GUAIFENESIN (MUCINEX) 600 MG 12 HR TABLET    Take 1 tablet (600 mg total) by mouth 2 (two) times daily.   HYDROCODONE-ACETAMINOPHEN (NORCO/VICODIN) 5-325 MG TABLET    Take 1 tablet by mouth at bedtime as needed for moderate pain or severe pain.   HYDROCORTISONE CREAM 1 %    Apply 1 application topically daily as needed (Eczema on face and head).   IPRATROPIUM-ALBUTEROL (DUONEB) 0.5-2.5 (3) MG/3ML SOLN    Take 3 mLs by nebulization 3 (three) times daily.   METOPROLOL TARTRATE (LOPRESSOR) 25 MG TABLET    Take 1 tablet (25 mg total) by mouth 2 (two) times daily.   MULTIPLE VITAMINS-MINERALS (MULTIVITAMIN WITH MINERALS) TABLET    Take 1 tablet by mouth daily.   OMEPRAZOLE (PRILOSEC) 40 MG CAPSULE    Take 40 mg by mouth daily.   OXYGEN    Inhale 2-3 L into the lungs See admin instructions. 2L daytime, 3L at night    POLYETHYLENE GLYCOL (MIRALAX / GLYCOLAX) PACKET    Take 17 g by mouth daily.   PREDNISONE (DELTASONE) 10 MG TABLET    Take 1 tablet (10 mg total) by mouth daily.   PREDNISONE (DELTASONE) 20 MG TABLET    Take 40mg27mly for 2days then 20mg 69my for 2days then STOP   ROSUVASTATIN (CRESTOR) 10 MG TABLET    Take 10 mg by mouth daily.   SILDENAFIL (REVATIO) 20 MG TABLET    Take 1 tablet (20 mg total) by mouth 2 (two) times daily.   SPIRIVA HANDIHALER 18 MCG INHALATION CAPSULE    INHALE THE CONTENTS OF 1 CAPSULE EVERY DAY   TAMSULOSIN (FLOMAX) 0.4 MG CAPS CAPSULE    Take 1 capsule (0.4 mg total) by mouth daily after breakfast.   TEMAZEPAM (RESTORIL) 15 MG CAPSULE    Take 1 capsule (15 mg total) by mouth at bedtime as needed  for sleep (1-2 tabs as needed).   VITAMIN C (ASCORBIC ACID) 500 MG TABLET    Take 500 mg by mouth daily.  Modified Medications   No medications on file   Discontinued Medications   AZITHROMYCIN (ZITHROMAX) 250 MG TABLET    Take 1 tablet (250 mg total) by mouth daily. For 2days

## 2018-02-04 NOTE — Discharge Summary (Signed)
Physician Discharge Summary  Andrew [name] HUD:[id number] DOB: 1941/04/15 DOA: 01/17/2018  PCP: Lujean Amel, MD  Admit date: 01/17/2018 Discharge date: 01/21/2018  Time spent: 35 minutes  Recommendations for Outpatient Follow-up:  PCP Dr.Koirala in 1 week, needs Pulm FU in 1 month  Discharge Diagnoses:  Principal Problem:   COPD with acute exacerbation ([place]) Active Problems:   Chronic respiratory failure with hypoxia ([facility])   Normocytic anemia   Paroxysmal atrial fibrillation ([facility])   OSA on CPAP   Influenza-like illness   Pressure injury of skin   Discharge Condition: stable  Diet recommendation: low sodium  Filed Weights   01/19/18 0321 01/20/18 0436 01/21/18 0456  Weight: 63.1 kg (139 lb 1.6 oz) 68.1 kg (150 lb 2.1 oz) 63.5 kg (140 lb)    History of present illness:  Andrew [name] is a 77 y.o.malewith medical history significant forCOPD with chronic hypoxic respiratory failure on 2-3L [place] at baseline and steroid dependence, paroxysmal atrial fibrillation on Eliquis, pulmonary hypertension on sildenafil, and hypertension, now presenting to the emergency department for evaluation of worsening dyspnea, wheezing, productive cough, generalized aches, and fevers  Hospital Course:  COPD with acute exacerbation; chronic hypoxic respiratory failure - Baseline Major O2 2-3L and at baseline on chronic prednisone 5 mg BID   - CXR personally reviewed, with chronic emphysematous changes but no acute consolidation, pleural effusions -clinically improved with Iv steroids, nebs, Abx  - Influenza negative, strep pneumo negative, Blood cultures negative to date  -transitioned to prednisone taper at DC and then advised to resume home dose of chronic prednisone, advised close FU with Pulm post DC  Paroxysmal atrial fibrillation -CHADS-VASc (age x2, HTN) -Continue Eliquis, diltiazem, lopressor  - stable  Hypertension  - BP stable, continue lopressor, diltiazem   Pulmonary  hypertension  - Continue sildenafil  GERD - Continue PPI   HLD - Continue crestor   BPH - Continue flomax, proscar   Discharge Exam: Vitals:   01/20/18 2256 01/21/18 0456  BP:  (!) 116/49  Pulse: 63 65  Resp:    Temp:  97.8 F (36.6 C)  SpO2:  100%    General: AAOx3 Cardiovascular: S1S2/RRR Respiratory: CTAB  Discharge Instructions   Discharge Instructions    Diet - low sodium heart healthy   Complete by:  As directed    Increase activity slowly   Complete by:  As directed      Allergies as of 01/21/2018   No Known Allergies     Medication List    STOP taking these medications   doxycycline 100 MG tablet Commonly known as:  VIBRA-TABS   feeding supplement Liqd     TAKE these medications   ADVAIR DISKUS 250-50 MCG/DOSE Aepb Generic drug:  Fluticasone-Salmeterol Inhale 1 puff into the lungs 2 (two) times daily.   albuterol 108 (90 Base) MCG/ACT inhaler Commonly known as:  PROVENTIL HFA;VENTOLIN HFA Inhale 2 puffs into the lungs every 6 (six) hours as needed for wheezing or shortness of breath. What changed:  when to take this   apixaban 5 MG Tabs tablet Commonly known as:  ELIQUIS Take 1 tablet (5 mg total) by mouth 2 (two) times daily.   benzonatate 100 MG capsule Commonly known as:  TESSALON Take 1 capsule (100 mg total) 3 (three) times daily by mouth. What changed:  when to take this   DILT-XR 120 MG 24 hr capsule Generic drug:  diltiazem Take 120 mg by mouth at bedtime.   finasteride 5 MG tablet Commonly known  as:  PROSCAR Take 5 mg by mouth at bedtime.   fluticasone 50 MCG/ACT nasal spray Commonly known as:  FLONASE Place 2 sprays into the nose daily as needed.   guaiFENesin 600 MG 12 hr tablet Commonly known as:  MUCINEX Take 1 tablet (600 mg total) by mouth 2 (two) times daily. What changed:    how much to take  when to take this   HYDROcodone-acetaminophen 5-325 MG tablet Commonly known as:  NORCO/VICODIN Take 1 tablet  by mouth at bedtime as needed for moderate pain or severe pain.   hydrocortisone cream 1 % Apply 1 application topically daily as needed (Eczema on face and head).   ipratropium-albuterol 0.5-2.5 (3) MG/3ML Soln Commonly known as:  DUONEB Take 3 mLs by nebulization 3 (three) times daily.   metoprolol tartrate 25 MG tablet Commonly known as:  LOPRESSOR Take 1 tablet (25 mg total) by mouth 2 (two) times daily.   multivitamin with minerals tablet Take 1 tablet by mouth daily.   omeprazole 40 MG capsule Commonly known as:  PRILOSEC Take 40 mg by mouth daily.   OXYGEN Inhale 2-3 L into the lungs See admin instructions. 2L daytime, 3L at night   polyethylene glycol packet Commonly known as:  MIRALAX / GLYCOLAX Take 17 g by mouth daily. What changed:    when to take this  additional instructions   predniSONE 20 MG tablet Commonly known as:  DELTASONE Take 40mg  daily for 2days then 20mg  daily for 2days then STOP What changed:    medication strength  how much to take  how to take this  when to take this  additional instructions   predniSONE 10 MG tablet Commonly known as:  DELTASONE Take 1 tablet (10 mg total) by mouth daily. What changed:  You were already taking a medication with the same name, and this prescription was added. Make sure you understand how and when to take each.   rosuvastatin 10 MG tablet Commonly known as:  CRESTOR Take 10 mg by mouth daily.   sildenafil 20 MG tablet Commonly known as:  REVATIO Take 1 tablet (20 mg total) by mouth 2 (two) times daily.   SPIRIVA HANDIHALER 18 MCG inhalation capsule Generic drug:  tiotropium INHALE THE CONTENTS OF 1 CAPSULE EVERY DAY   tamsulosin 0.4 MG Caps capsule Commonly known as:  FLOMAX Take 1 capsule (0.4 mg total) by mouth daily after breakfast. What changed:  when to take this   temazepam 15 MG capsule Commonly known as:  RESTORIL Take 1 capsule (15 mg total) by mouth at bedtime as needed for  sleep (1-2 tabs as needed). What changed:  Another medication with the same name was removed. Continue taking this medication, and follow the directions you see here.   vitamin C 500 MG tablet Commonly known as:  ASCORBIC ACID Take 500 mg by mouth daily.      No Known Allergies Follow-up Information    Koirala, Dibas, MD. Schedule an appointment as soon as possible for a visit in 1 week(s).   Specialty:  Family Medicine Contact information: [address] [place] [address] [phone]        Noralee Space, MD. Schedule an appointment as soon as possible for a visit in 1 month(s).   Specialty:  Pulmonary Disease Contact information: [place] [address] [phone]            The results of significant diagnostics from this hospitalization (including  imaging, microbiology, ancillary and laboratory) are listed below for reference.    Significant Diagnostic Studies: Dg Chest 2 View  Result Date: 01/17/2018 CLINICAL DATA:  Body aches, fever, productive cough EXAM: CHEST  2 VIEW COMPARISON:  09/22/2017 FINDINGS: Emphysematous changes with bullous changes in the right upper lobe. No pleural effusion or pneumothorax. The heart is normal in size. Visualized osseous structures are within normal limits. IMPRESSION: No evidence of acute cardiopulmonary disease. Electronically Signed   By: Julian Hy M.D.   On: 01/17/2018 18:24    Microbiology: No results found for this or any previous visit (from the past 240 hour(s)).   Labs: Basic Metabolic Panel: No results for input(s): NA, K, CL, CO2, GLUCOSE, BUN, CREATININE, CALCIUM, MG, PHOS in the last 168 hours. Liver Function Tests: No results for input(s): AST, ALT, ALKPHOS, BILITOT, PROT, ALBUMIN in the last 168 hours. No results for input(s): LIPASE, AMYLASE in the last 168 hours. No results for input(s): AMMONIA in the last 168 hours. CBC: No results for input(s): WBC, NEUTROABS,  HGB, HCT, MCV, PLT in the last 168 hours. Cardiac Enzymes: No results for input(s): CKTOTAL, CKMB, CKMBINDEX, TROPONINI in the last 168 hours. BNP: BNP (last 3 results) No results for input(s): BNP in the last 8760 hours.  ProBNP (last 3 results) No results for input(s): PROBNP in the last 8760 hours.  CBG: No results for input(s): GLUCAP in the last 168 hours.     Signed:  Domenic Polite MD.  [facility] 02/04/2018, 3:58 PM

## 2018-02-10 ENCOUNTER — Encounter: Payer: Self-pay | Admitting: Physician Assistant

## 2018-02-10 ENCOUNTER — Ambulatory Visit (INDEPENDENT_AMBULATORY_CARE_PROVIDER_SITE_OTHER): Payer: Medicare Other | Admitting: Physician Assistant

## 2018-02-10 VITALS — BP 107/54 | HR 84 | Ht 67.0 in | Wt 142.0 lb

## 2018-02-10 DIAGNOSIS — G4733 Obstructive sleep apnea (adult) (pediatric): Secondary | ICD-10-CM | POA: Diagnosis not present

## 2018-02-10 DIAGNOSIS — I493 Ventricular premature depolarization: Secondary | ICD-10-CM

## 2018-02-10 DIAGNOSIS — I499 Cardiac arrhythmia, unspecified: Secondary | ICD-10-CM | POA: Diagnosis not present

## 2018-02-10 DIAGNOSIS — I10 Essential (primary) hypertension: Secondary | ICD-10-CM

## 2018-02-10 DIAGNOSIS — J449 Chronic obstructive pulmonary disease, unspecified: Secondary | ICD-10-CM

## 2018-02-10 DIAGNOSIS — E785 Hyperlipidemia, unspecified: Secondary | ICD-10-CM

## 2018-02-10 DIAGNOSIS — I498 Other specified cardiac arrhythmias: Secondary | ICD-10-CM

## 2018-02-10 MED ORDER — DILTIAZEM HCL ER COATED BEADS 180 MG PO CP24: 180.0000 mg | ORAL_CAPSULE | Freq: Every day | ORAL | 2 refills | Status: DC

## 2018-02-10 MED ORDER — DILTIAZEM HCL ER COATED BEADS 180 MG PO CP24: 180.0000 mg | ORAL_CAPSULE | Freq: Every day | ORAL | 0 refills | Status: DC

## 2018-02-10 NOTE — Progress Notes (Signed)
Cardiology Office Note    Date:  02/12/2018   ID:  Andrew [name], DOB 04/04/1941, MRN [id number]  PCP:  Lujean Amel, MD  Cardiologist:  Dr. Oval Linsey   Chief Complaint  Patient presents with  . Follow-up    pt states SOB has increased over the last month or so     History of Present Illness:  Andrew [name] is a 77 y.o. male with PMH of PAF, COPD, pulmonary HTN, OSA on BiPAP, HTN and HLD.  Patient was admitted in October 2018 with pneumonia.  Hospitalization was complicated by atrial fibrillation with RVR.  Due to multiple episodes of atrial fibrillation, he was started on Eliquis for systemic anticoagulation.  He was already on diltiazem prior to admission, metoprolol was added to his medical regimen.  Echocardiogram revealed EF 55-60%, however was noted that he has pulmonary hypertension from severe COPD.  He was previously treated in [place] and was started on sildenafil.  He was working with Dr. Leeanne Deed, the dose was lowered, however patient declined further weaning.  He was last seen by Dr. Oval Linsey on 09/22/2017, at which time he had another episode of fever and also has more shortness of breath with exertion.  He was started on prednisone at the time.  More recently, patient was admitted in early March for respiratory distress.  He was treated with COPD with acute exacerbation.  He was tested negative for influenza and strep pneumo.  Blood culture was also negative.  He was treated with IV steroid nebulizer and antibiotic and was eventually discharged on tapering dose of prednisone before resuming home dose of chronic prednisone.  EKG obtained during the recent hospitalization showed sinus tachycardia with PAC.  Most recently patient was seen by Dr. Leeanne Deed on 02/02/2018.  Manual heart rate was 34, EKG showed patient was in bigeminy at the time and the machine failed to count the PVCs.  His actual heartbeat was in the 60s.  He presents today for cardiology office visit, he is still on 2 L  oxygen during the day, and uses 3 L at night.  We obtained an EKG in the office today which showed he remained in bigeminy.  He is largely asymptomatic and denies significant dizziness other than when he suddenly get up.  He feels like his breathing is getting worse, he is on a daily dosing of prednisone 10 mg.  His worsening breathing is related to COPD which also contribute to the the number of PVCs.  Otherwise he appears to be euvolemic on physical exam.  I will increase his diltiazem to 180 mg daily.  He will need 24-hour Holter monitor in 1-2 weeks to assess the number of PVCs after the increase of diltiazem.  He can follow-up with Dr. Oval Linsey after that.   Past Medical History:  Diagnosis Date  . BiPAP (biphasic positive airway pressure) dependence    Pt denies history of OSA  . BPH (benign prostatic hyperplasia)   . Cancer ([place])    skin  . COPD (chronic obstructive pulmonary disease) ([place])   . Dysphagia   . Emphysema lung ([place])   . GERD (gastroesophageal reflux disease)    " Silent reflux"  . Hearing loss    right ear  . HLD (hyperlipidemia)   . Hypertension   . Oxygen dependent    2-3 liters  . Oxygen dependent   . Pneumonia   . Pulmonary hypertension ([place])     Past Surgical History:  Procedure Laterality Date  . CARDIAC  CATHETERIZATION    . CATARACT EXTRACTION W/ INTRAOCULAR LENS  IMPLANT, BILATERAL    . ESOPHAGOGASTRODUODENOSCOPY (EGD) WITH PROPOFOL N/A 08/23/2016   Procedure: ESOPHAGOGASTRODUODENOSCOPY (EGD) WITH PROPOFOL;  Surgeon: Wilford Corner, MD;  Location: [facility];  Service: Endoscopy;  Laterality: N/A;  . LUNG SURGERY    . TONSILLECTOMY      Current Medications: Outpatient Medications Prior to Visit  Medication Sig Dispense Refill  . ADVAIR DISKUS 250-50 MCG/DOSE AEPB Inhale 1 puff into the lungs 2 (two) times daily. 60 each 2  . albuterol (PROVENTIL HFA;VENTOLIN HFA) 108 (90 Base) MCG/ACT inhaler Inhale 2 puffs into the lungs every 6 (six) hours as  needed for wheezing or shortness of breath. (Patient taking differently: Inhale 2 puffs into the lungs daily. ) 1 Inhaler 11  . apixaban (ELIQUIS) 5 MG TABS tablet Take 1 tablet (5 mg total) by mouth 2 (two) times daily. 60 tablet 10  . benzonatate (TESSALON) 100 MG capsule Take 1 capsule (100 mg total) 3 (three) times daily by mouth. (Patient taking differently: Take 100 mg by mouth 2 (two) times daily. ) 90 capsule 4  . finasteride (PROSCAR) 5 MG tablet Take 5 mg by mouth at bedtime.     . fluticasone (FLONASE) 50 MCG/ACT nasal spray Place 2 sprays into the nose daily as needed.     Marland Kitchen guaiFENesin (MUCINEX) 600 MG 12 hr tablet Take 1 tablet (600 mg total) by mouth 2 (two) times daily. (Patient taking differently: Take 1,200 mg by mouth daily. ) 30 tablet 0  . HYDROcodone-acetaminophen (NORCO/VICODIN) 5-325 MG tablet Take 1 tablet by mouth at bedtime as needed for moderate pain or severe pain. 15 tablet 0  . hydrocortisone cream 1 % Apply 1 application topically daily as needed (Eczema on face and head).    Marland Kitchen ipratropium-albuterol (DUONEB) 0.5-2.5 (3) MG/3ML SOLN Take 3 mLs by nebulization 3 (three) times daily. 360 mL 2  . metoprolol tartrate (LOPRESSOR) 25 MG tablet Take 1 tablet (25 mg total) by mouth 2 (two) times daily. 60 tablet 0  . Multiple Vitamins-Minerals (MULTIVITAMIN WITH MINERALS) tablet Take 1 tablet by mouth daily.    Marland Kitchen omeprazole (PRILOSEC) 40 MG capsule Take 40 mg by mouth daily.    . OXYGEN Inhale 2-3 L into the lungs See admin instructions. 2L daytime, 3L at night     . polyethylene glycol (MIRALAX / GLYCOLAX) packet Take 17 g by mouth daily. (Patient taking differently: Take 17 g by mouth at bedtime. Mix in 8 oz liquid and drink) 14 each 1  . predniSONE (DELTASONE) 10 MG tablet Take 1 tablet (10 mg total) by mouth daily. 30 tablet 0  . predniSONE (DELTASONE) 20 MG tablet Take 40mg  daily for 2days then 20mg  daily for 2days then STOP 10 tablet 0  . rosuvastatin (CRESTOR) 10 MG  tablet Take 10 mg by mouth daily.    . sildenafil (REVATIO) 20 MG tablet Take 1 tablet (20 mg total) by mouth 2 (two) times daily. 180 tablet 3  . SPIRIVA HANDIHALER 18 MCG inhalation capsule INHALE THE CONTENTS OF 1 CAPSULE EVERY DAY 90 capsule 3  . tamsulosin (FLOMAX) 0.4 MG CAPS capsule Take 1 capsule (0.4 mg total) by mouth daily after breakfast. (Patient taking differently: Take 0.4 mg by mouth at bedtime. ) 30 capsule 0  . temazepam (RESTORIL) 15 MG capsule Take 1 capsule (15 mg total) by mouth at bedtime as needed for sleep (1-2 tabs as needed). 60 capsule 0  . vitamin C (  ASCORBIC ACID) 500 MG tablet Take 500 mg by mouth daily.    Marland Kitchen DILT-XR 120 MG 24 hr capsule Take 120 mg by mouth at bedtime.  3   No facility-administered medications prior to visit.      Allergies:   Patient has no known allergies.   Social History   Socioeconomic History  . Marital status: Divorced    Spouse name: Not on file  . Number of children: Not on file  . Years of education: Not on file  . Highest education level: Not on file  Occupational History  . Not on file  Social Needs  . Financial resource strain: Not on file  . Food insecurity:    Worry: Not on file    Inability: Not on file  . Transportation needs:    Medical: Not on file    Non-medical: Not on file  Tobacco Use  . Smoking status: Former Smoker    Packs/day: 0.00    Years: 0.00    Pack years: 0.00    Types: Cigarettes    Last attempt to quit: 10/01/1984    Years since quitting: 33.3  . Smokeless tobacco: Never Used  Substance and Sexual Activity  . Alcohol use: No    Alcohol/week: 0.0 oz  . Drug use: No  . Sexual activity: Not on file  Lifestyle  . Physical activity:    Days per week: Not on file    Minutes per session: Not on file  . Stress: Not on file  Relationships  . Social connections:    Talks on phone: Not on file    Gets together: Not on file    Attends religious service: Not on file    Active member of club  or organization: Not on file    Attends meetings of clubs or organizations: Not on file    Relationship status: Not on file  Other Topics Concern  . Not on file  Social History Narrative  . Not on file     Family History:  The patient's family history includes Bone cancer in his brother; Cancer in his maternal uncle; Congestive Heart Failure in his maternal grandfather; Pancreatitis in his mother; Stroke in his maternal grandfather.   ROS:   Please see the history of present illness.    ROS All other systems reviewed and are negative.   PHYSICAL EXAM:   VS:  BP (!) 107/54   Pulse 84   Ht 5\' 7"  (1.702 m)   Wt 142 lb (64.4 kg)   SpO2 90%   BMI 22.24 kg/m    GEN: Well nourished, well developed, in no acute distress  HEENT: normal  Neck: no JVD, carotid bruits, or masses Cardiac: RRR; no murmurs, rubs, or gallops,no edema  Respiratory:  clear to auscultation bilaterally, normal work of breathing GI: soft, nontender, nondistended, + BS MS: no deformity or atrophy  Skin: warm and dry, no rash Neuro:  Alert and Oriented x 3, Strength and sensation are intact Psych: euthymic mood, full affect  Wt Readings from Last 3 Encounters:  02/10/18 142 lb (64.4 kg)  02/02/18 137 lb (62.1 kg)  01/21/18 140 lb (63.5 kg)      Studies/Labs Reviewed:   EKG:  EKG is ordered today.  The ekg ordered today demonstrates EKG strip showed bigeminy  Recent Labs: 09/12/2017: Magnesium 2.2; TSH 0.518 01/17/2018: ALT 14 01/18/2018: BUN 13; Creatinine, Ser 0.95; Potassium 4.5; Sodium 138 01/21/2018: Hemoglobin 9.0; Platelets 215   Lipid Panel No  results found for: CHOL, TRIG, HDL, CHOLHDL, VLDL, LDLCALC, LDLDIRECT  Additional studies/ records that were reviewed today include:    Echo 09/10/2017 LV EF: 55% -   60% Study Conclusions  - Left ventricle: The cavity size was normal. Wall thickness was   normal. Systolic function was normal. The estimated ejection   fraction was in the range of 55%  to 60%. Wall motion was normal;   there were no regional wall motion abnormalities. The study is   not technically sufficient to allow evaluation of LV diastolic   function. - Pulmonary arteries: PA peak pressure: 33 mm Hg (S). - Pericardium, extracardiac: A trivial pericardial effusion was   identified.  Impressions:  - Technically difficult; no apical views; normal LV systolic   function; trace MR and mild TR.    ASSESSMENT:    1. PVC (premature ventricular contraction)   2. Bigeminy   3. Chronic obstructive pulmonary disease, unspecified COPD type ([place])   4. OSA treated with BiPAP   5. Essential hypertension   6. Hyperlipidemia, unspecified hyperlipidemia type      PLAN:  In order of problems listed above:  1. Bigeminy: He was noted to recently in pulmonology office to be in bigeminy, he continued to be in bigeminy based on today's EKG strip.  The slow heart rate recorded in pulmonology office is due to failure of the machine to pick up PVC.  Otherwise his heart rate is actually in the 60s.  I will increase his diltiazem to 180 mg daily.  He will continue on the metoprolol.  He will need 24-hour Holter monitor in 1-2 weeks to assess the heart rate and is the number of PVCs.  2. COPD on home O2: On 2 L during the day and 3 L at night.  He continued to struggle with breathing issue.   3. Hypertension: Blood pressure well controlled.  I increased his diltiazem slightly mainly for rate control purposes.  He will need to monitor his blood pressure at home.  4. Hyperlipidemia: Continue Crestor 10 mg daily    Medication Adjustments/Labs and Tests Ordered: Current medicines are reviewed at length with the patient today.  Concerns regarding medicines are outlined above.  Medication changes, Labs and Tests ordered today are listed in the Patient Instructions below. Patient Instructions  Medication Instructions:  INCREASE Diltiazem 180mg  Take 1 tablet   Labwork: None    Testing/Procedures: Your physician has recommended that you wear a 24 hour holter monitor. Holter monitors are medical devices that record the heart's electrical activity. Doctors most often use these monitors to diagnose arrhythmias. Arrhythmias are problems with the speed or rhythm of the heartbeat. The monitor is a small, portable device. You can wear one while you do your normal daily activities. This is usually used to diagnose what is causing palpitations/syncope (passing out). Monitor will be placed at the [facility]: [address] Schedule monitor in 1-2 weeks  Follow-Up: Keep upcoming appointment as scheduled  Any Other Special Instructions Will Be Listed Below (If Applicable). Monitor blood pressure   If you need a refill on your cardiac medications before your next appointment, please call your pharmacy.     Hilbert Corrigan, [place]  02/12/2018 12:07 AM    [address], [place], [place]  [address] Phone: [phone]; Fax: [phone]

## 2018-02-10 NOTE — Patient Instructions (Addendum)
Medication Instructions:  INCREASE Diltiazem 180mg  Take 1 tablet   Labwork: None   Testing/Procedures: Your physician has recommended that you wear a 24 hour holter monitor. Holter monitors are medical devices that record the heart's electrical activity. Doctors most often use these monitors to diagnose arrhythmias. Arrhythmias are problems with the speed or rhythm of the heartbeat. The monitor is a small, portable device. You can wear one while you do your normal daily activities. This is usually used to diagnose what is causing palpitations/syncope (passing out). Monitor will be placed at the [facility]: [address] Schedule monitor in 1-2 weeks  Follow-Up: Keep upcoming appointment as scheduled  Any Other Special Instructions Will Be Listed Below (If Applicable). Monitor blood pressure   If you need a refill on your cardiac medications before your next appointment, please call your pharmacy.

## 2018-02-12 ENCOUNTER — Encounter: Payer: Self-pay | Admitting: Physician Assistant

## 2018-02-16 ENCOUNTER — Ambulatory Visit: Payer: Medicare Other | Admitting: Cardiovascular Disease

## 2018-02-23 DIAGNOSIS — R5381 Other malaise: Secondary | ICD-10-CM | POA: Diagnosis not present

## 2018-02-23 DIAGNOSIS — J439 Emphysema, unspecified: Secondary | ICD-10-CM | POA: Diagnosis not present

## 2018-02-24 ENCOUNTER — Ambulatory Visit (INDEPENDENT_AMBULATORY_CARE_PROVIDER_SITE_OTHER): Payer: Medicare Other

## 2018-02-24 DIAGNOSIS — I493 Ventricular premature depolarization: Secondary | ICD-10-CM

## 2018-03-02 ENCOUNTER — Ambulatory Visit (INDEPENDENT_AMBULATORY_CARE_PROVIDER_SITE_OTHER): Payer: Medicare Other | Admitting: Pulmonary Disease

## 2018-03-02 ENCOUNTER — Other Ambulatory Visit (INDEPENDENT_AMBULATORY_CARE_PROVIDER_SITE_OTHER): Payer: Medicare Other

## 2018-03-02 VITALS — BP 126/64 | HR 55

## 2018-03-02 DIAGNOSIS — I493 Ventricular premature depolarization: Secondary | ICD-10-CM | POA: Diagnosis not present

## 2018-03-02 DIAGNOSIS — E538 Deficiency of other specified B group vitamins: Secondary | ICD-10-CM

## 2018-03-02 DIAGNOSIS — I272 Pulmonary hypertension, unspecified: Secondary | ICD-10-CM | POA: Diagnosis not present

## 2018-03-02 DIAGNOSIS — D649 Anemia, unspecified: Secondary | ICD-10-CM

## 2018-03-02 DIAGNOSIS — I480 Paroxysmal atrial fibrillation: Secondary | ICD-10-CM

## 2018-03-02 DIAGNOSIS — I10 Essential (primary) hypertension: Secondary | ICD-10-CM

## 2018-03-02 DIAGNOSIS — J9611 Chronic respiratory failure with hypoxia: Secondary | ICD-10-CM

## 2018-03-02 DIAGNOSIS — G4733 Obstructive sleep apnea (adult) (pediatric): Secondary | ICD-10-CM | POA: Diagnosis not present

## 2018-03-02 DIAGNOSIS — Z8701 Personal history of pneumonia (recurrent): Secondary | ICD-10-CM

## 2018-03-02 DIAGNOSIS — J432 Centrilobular emphysema: Secondary | ICD-10-CM

## 2018-03-02 DIAGNOSIS — Z9989 Dependence on other enabling machines and devices: Secondary | ICD-10-CM | POA: Diagnosis not present

## 2018-03-02 LAB — COMPREHENSIVE METABOLIC PANEL
ALT: 12 U/L (ref 0–53)
AST: 9 U/L (ref 0–37)
Albumin: 4 g/dL (ref 3.5–5.2)
Alkaline Phosphatase: 63 U/L (ref 39–117)
BILIRUBIN TOTAL: 0.3 mg/dL (ref 0.2–1.2)
BUN: 17 mg/dL (ref 6–23)
CO2: 29 meq/L (ref 19–32)
Calcium: 9.4 mg/dL (ref 8.4–10.5)
Chloride: 104 mEq/L (ref 96–112)
Creatinine, Ser: 0.92 mg/dL (ref 0.40–1.50)
GFR: 84.45 mL/min (ref >60.00)
Glucose, Bld: 103 mg/dL — ABNORMAL HIGH (ref 70–99)
POTASSIUM: 4.9 meq/L (ref 3.5–5.1)
SODIUM: 141 meq/L (ref 135–145)
TOTAL PROTEIN: 6.7 g/dL (ref 6.0–8.3)

## 2018-03-02 LAB — CBC WITH DIFFERENTIAL/PLATELET
Basophils Absolute: 0.1 10*3/uL (ref 0.0–0.1)
Basophils Relative: 0.8 % (ref 0.0–3.0)
EOS PCT: 0.3 % (ref 0.0–5.0)
Eosinophils Absolute: 0 10*3/uL (ref 0.0–0.7)
HEMATOCRIT: 32.7 % — AB (ref 39.0–52.0)
HEMOGLOBIN: 10.6 g/dL — AB (ref 13.0–17.0)
LYMPHS ABS: 1.6 10*3/uL (ref 0.7–4.0)
Lymphocytes Relative: 15.8 % (ref 12.0–46.0)
MCHC: 32.5 g/dL (ref 30.0–36.0)
MCV: 87.9 fl (ref 78.0–100.0)
MONO ABS: 0.8 10*3/uL (ref 0.1–1.0)
MONOS PCT: 8 % (ref 3.0–12.0)
NEUTROS ABS: 7.7 10*3/uL (ref 1.4–7.7)
Neutrophils Relative %: 75.1 % (ref 43.0–77.0)
Platelets: 366 10*3/uL (ref 150.0–400.0)
RBC: 3.72 Mil/uL — AB (ref 4.22–5.81)
RDW: 14.8 % (ref 11.5–15.5)
WBC: 10.2 10*3/uL (ref 4.0–10.5)

## 2018-03-02 LAB — VITAMIN B12: VITAMIN B 12: 638 pg/mL (ref 211–911)

## 2018-03-02 LAB — IRON: IRON: 27 ug/dL — AB (ref 42–165)

## 2018-03-02 LAB — IBC PANEL
IRON: 27 ug/dL — AB (ref 42–165)
SATURATION RATIOS: 6 % — AB (ref 20.0–50.0)
TRANSFERRIN: 324 mg/dL (ref 212.0–360.0)

## 2018-03-02 LAB — FOLATE: Folate: >24.1 ng/mL (ref >5.9)

## 2018-03-02 LAB — FERRITIN: Ferritin: 8.6 ng/mL — ABNORMAL LOW (ref 22.0–322.0)

## 2018-03-02 MED ORDER — DILTIAZEM HCL ER COATED BEADS 180 MG PO CP24: 180.0000 mg | ORAL_CAPSULE | Freq: Every day | ORAL | 2 refills | Status: DC

## 2018-03-02 NOTE — Patient Instructions (Signed)
Today we updated your med list in our EPIC system...    Continue your current medications the same...  We discussed continuing the PREDNISONE 10mg  daily (each AM)...  We will arrange for outpatien physical therapy for you...    The goal is to get you stong enough to consider PULMONARY REHAB again...  Call for any questions...  Let's plan a follow up visit in 6-8 weeks, sooner if needed for problems.Marland KitchenMarland Kitchen

## 2018-03-03 ENCOUNTER — Encounter: Payer: Self-pay | Admitting: Pulmonary Disease

## 2018-03-03 ENCOUNTER — Telehealth: Payer: Self-pay | Admitting: Pulmonary Disease

## 2018-03-03 NOTE — Telephone Encounter (Signed)
Called and spoke to pt.  Pt stated he is returning a call from Dr. Lenna Gilford regarding lab results.  Best contact number for pt is  [phone]   Dr. Lenna Gilford please advise. Thanks

## 2018-03-03 NOTE — Progress Notes (Signed)
Subjective:     Patient ID: Andrew [name], male   DOB: 1941/08/10, 77 y.o.   MRN: [id number]  HPI 77 y/o WM, an ex-smoker quit in 1986, w/ severe bullous emphysema & hx of RUL bullous resection in 2007; Hx both ?hypercarbic & +hypoxemic resp failure w/ cor pulmonale & secondary pulm HTN on Revatio x yrs; He has been stable for >10 yrs on the same pulm regimen as he has travelled around the [place] living in [place], [place], and [place]...  ~  October 02, 2015:  Initial pulmonary evaluation by SN>  His PCP is Dr. Lujean Amel, [name]...       Poss is a 77 y/o gentleman from [place]- moved here to [place] ~45moago to live w/ his daughter & son-in-law;  He has a long convoluted history & we have none of his prev objective data to review;  He tells me that he has known about COPD/Emphysema for >10 yrs and in 2007 he had right thoracotomy & "bleb-ectomy";  After this procedure he was placed on Oxygen at 2L/min and BiPAP to use at night, along w/ ADVAIR250Bid & SPIRIVA daily, plus REVATIO20Tid for pulmonary HTN;  He was also treated w/ NEBS for about 1-245yrthen this was discontinued;  He has pretty much been on this same regimen for the past 9 yrs w/o much change, despite or maybe because he has moved around a lot- [name], [name]...  He describes himself as being rock-solid stable on this exact regimen since 2007- he had Cath (?left & right heart) in 2007, told 1 blockage, good LVF ?right heart results, started on O2, BiPAP, and Revatio but he does not know why?  He notes min cough when supine & in early AM attributed to reflux; min if any sput production, no hemoptysis, he denies SOB but states DOE "if I over-exert" eg- walking, lifting/carrying, stairs, etc; he notes that ADLs are ok- no problem (he is stoic);  He denies CP, palpit, f/c/s, edema... He hasn't been  to an ER since 2007 he says & that was also the last time he had any Pred; he thinks he had CXR, PFT, 2DEcho all earlier this yr...   Smoking Hx>  He is an ex-smoker, started in his teens, smoked for 30 yrs up to 1ppd, quit in 1986; This is a 30 pack-yr hx, he does not recall ever being checked for A1AT defic...  Pulmonary Hx>  COPD/ Emphysema w/ right upper lobe "bleb[place]) [place] hypoxemic resp failure on O2 at 2L/min since 2007;  He tells me that he was started on BiPAP about that same time but he doesn't know why- never had sleep study, not on CPAP prev, he does not know about pCO2 levels etc ("I like the fresh cool air");  [name] he does not know the settings, machine never downloaded, etc;  He has also been on Revatio20Tid since 2007, apparently never tried on other meds, dose never adjusted- he knows about "pulmonary hypertension" but he doesn't know any details and it doesn't appear to have been followed up, and meds kept the same from doctor to doctor...   Medical Hx>  HBP, ?nonobstructive CAD, HL, thyroid nodule, GERD, constipation, BPH, insomnia...  Family Hx>  Father died w/ Emphysema &  was a former smoker; no other hx lung dis in the family; Alpha-1 status is unknown...  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   Current Meds>  Oxygen 2L/min pulse-dose concentrator, Advair250Bid, Spiriva daily, Revatio20Tid, CardizemCD240, Crestor10, Nexium20, Proscar5, Restoril30...  EXAM shows Afeb, VSS, O2sat=93% on 2L/min pulse-dose;  Heent- neg, mallampati1;  Chest- decr BS at bases, can't augment BS voluntarily, w/o w/r/r;  Heart- RR w/o m/r/g;  Abd- soft, neg;  Ext- neg w/o c/c/e;  Neuro- intact...  CXR 10/02/15 showed norm heart size,  COPD, bullous emphysema/ hyperinflation, scarring right apex, NAD...   Spirometry 10/02/15 showed FVC=2.70 (69%), FEV1=1.12 (37%), %1sec=41, mid-flows reduced at 18% predicted; this is c/w severe airflow obstruction & GOLD Stage 3 COPD  Ambulatory oxygen saturation test 10/02/15> on O2 at 2L/min pulse-dose concentrator: O2sat=96% on 2L at rest w/ pulse=87; he walked 2 laps w/ his O2, stopped due to dyspnea, lowest O2sat=89% w/ pulse=113/min...  LAB 10/02/15>  Alpha-1-Antitrypsin level => pending (he never went to the lab for this blood test)  2DEchocardiogram 10/09/15 showed norm LVF w/ EF=55-60%, norm wall motion, mild MR, mild RA dil, PAsys est 66mHg... Pt on Revatio 20Tid x 934yr& I rec we wean slowly (Decr to Bid now)...    IMP >>     COPD/ bullous emphysema> s/p RUL "bleb-ectomy" in 2007, severe airflow obstruction w/ GOLD Stage3 COPD> on Advair250Bid & Spiriva daily; apparently he has no use for a rescue inhaler; prev on NEB w/ Albut but not for several yrs.     Chronic hypoxemic respiratory failure on O2 at 2L/min via pulse-dose concentrator...    Pt reports using BiPAP since 2007, never been on CPAP, never had sleep study he says, unknown ABGs or pCO2 data; machine from [name] will try to get the old data => none received.    Hx pulmonary hypertension on Revatio20Tid since 2007 w/o additional med trials or dose adjustments> we do not have any of the objective data from his prev physician teams... Current 2DEcho w/ PAsys est 3626m & we will wean the Revatio to Bid at this point => he does not want to wean further for "other" reasons...    Medical issues include:  HBP, ?nonobstructive CAD, HL, thyroid nodule, GERD, BPH, insomnia... PLAN >>     MarOmids a distinctly patchy history to go along w/ his severe airflow obstruction & bullous emphysema;  We really need his old objective data from 2007 when he was started on O2 & BiPAP after his RUL bleb reduction surg;  He will try to get  names and numbers for us-[place] the meanwhile we will [place] [facility] [place] [place] more recent data as we establish out [place] that he wants us [place] continue his current regimen which has served him well over the last 70yr270yrContinue Advair250Bid, Spiriva daily, Revatio20Tid=>Bid, O2 at 2L/min, and the BiPAP nightly as currently set... We plan ROV recheck in 1 month... NOTE> 2DEcho w/ PAsys est ~36mm40m we will slowly wean Revatio...  ~  November 01, 2015:  20mo R64mo MrBowman reports stable, doing satis & notes no untoward effects from cutting the Revatio to 20mgBi49me denies CP, palpit, incr SOB, edema, etc; he  continues on the O2 at 2L/min, BiPAP from [place] once daily; we have not received any records from his mult physicians ([place], [place], [place])...    EXAM shows Afeb, VSS, O2sat=92% on 2L/min pulse-dose;  Heent- neg, mallampati1;  Chest- decr BS at bases, can't augment BS voluntarily, w/o w/r/r;  Heart- RR w/o m/r/g;  Abd- soft, neg;  Ext- neg w/o c/c/e;  Neuro- intact... IMP/PLAN>>  See prob list above- he is stable on this regimen but does not want to wean the Revatio further for "other" reasons; OK to continue current med regimen- advised regular exercise vs pulm rehab program; given ZPak for prn use over the winter & he knows to call for any resp issues, incr dyspnea, etc... He remains on BiPAP but we do not have any data- no records received from prev pulm physicians, no notes from [place], no download data from his machine- we will again try to make contact w/ his DME company... We plan ROV in 3-67mo  ~  Mar 18, 2016:  4-542moOV & post hosp visit>  MaYeseniaas severe COPD/Emphysema, GOLD Stage 3 w/ FEV1=1.12L (37%predicted) [id number] Hx right thoracotomy & "bleb-ectomy" in 2007;  after this procedure he says he was placed on Oxygen at 2L/min and BiPAP to use at night, along w/ ADVAIR250Bid, SPIRIVA  daily, and REVATIO20Tid for pulmonary HTN (2DEcho here 11/16 showed PAsys est=3643m)-- we do not have any of that data from [place] have been unsuccessful in obtaining old data from any of his prev physicians); he has been resistent to any adjustment in his medication regimen for various reasons...     He's been [facility]> [facility] by [facility] w/ CAP- CXR showed severe bullous emphysema, incr markings in RLL and atelectasis in R-mid lung, Temp 103, Lactate=2.4, WBC 16K; treated w/ O2, Solumedrol=>Pred, Zosyn/Vanco=>Levaquin, NEBs, etc; disch home w/ home health help- ?seen by his PCP after disch...    [facility] w/ 1d hx incr SOB, wheezing, productive cough & felt to have a COPD exac; CXR showed his COPE/E, persistent incr markings in right base, WBC was elev at 20K, BNP=60, no pos cultures; he was treaed w/ O2, Solumedrol, Roceph/Zithro, NEBs, etc; he was disch on Levaquin & Pred; he developed urinary retention & foley placed (weaned off in NH); he was debilitated & sent to NH for rehab...     [facility] & attended by PieSunGardte dated 03/01/16 reviewed-- finished Levaquin, weaned off the Pred, they were able to discontinue the foley & he passed voiding trial; disch home after 19d in rehab...     Now back home on same O2= 2L/min days & 3L/min night w/ BiPAP ?settings (he has been on this for yrs and never re-assessed), NEB w/ AlbutTid, Advair250Bid, Spiriva daily, Revatio20Bid (pt refuses to taper this med further);  He has developed pedal edema x3d & needs a low sodium diet + diuretic started but he tells me he is sched to see his PCP soon for this problem...    EXAM shows Afeb, VSS, O2sat=95% on 2L/min pulse-dose;  Heent- neg, mallampati1;  Chest- decr BS at bases, can't augment BS voluntarily, w/o w/r/r;  Heart- RR w/o m/r/g;  Abd- soft, neg;  Ext- neg w/o c/c/e;  Neuro- intact...  CXR 02/11/16 showed  hyperinflation, bullous emphysema, some scarring in right mid lung & both lower lobes, no  infiltrate, no edema...  LABS 01/2016> all reviewed in Epic... IMP/PLAN>>  Kahleb has had a protracted resp exac- triggered by prob RLL pneumonia (NOS) superimposed on his severe COPD/bullous emphysema;  Rec to change the Albut for Neb to [place] & continue treatments Tid; continue other meds regularly as outlined;  He is referred to [facility] & hopes to start soon;  We will plan ROV in 38mosooner if needed.  ~  June 20, 2016:  365moOV w/ SN>  Marv returns for a 46m47moV & states that he is doing very well- no new complaints or concerns at this time, his PCP is DrKoirala; [facility] & continues in this program at present; Pt prev inquired about treatments at [facility] [place] regenerative lung tissue (stem cell therapy) which costs ~$10K per treatment & all benefits are antecdotal; I [facility] if he wants cutting edge research approach...     EPIC records indicate ER visit 05/08/16 for Abd Pain> VSS, exam was neg, CT Abd showed large fecal burden otherw neg, distended urinary bladder, atherosclerosis, bilat L5 pars defects; Rec to take laxatives...     Severe COPD- GOLD Stage3, bullous emphysema, s/p resection of RUL bleb in 2007> on Advair250Bid & Spiriva daily; he has NEB w/ Duoneb for prn use; Spirometry 09/2015 w/ FEV1=1.12 (37%); he is enrolled in PulOhiowants a POC instead of tanks;     Chronic hypoxemic respiratory failure on O2 at 2L/min via POC> ambulatory O2sats 09/2015 dropped to 89% on 2L/min after 2 Laps.    Hx prob hypercarbic resp failure inferred from Pt report of BiPAP ([facility]) since 2007, never been on CPAP, never had sleep study, unknown ABGs or pCO2 data>    Hx cor pulmonale/ pulmonary hypertension on Revatio20Tid since 2007 w/o additional med trials or dose adjustments> we do not have any of the objective data from his prev  physician teams; 2DEcho here 09/2015 showed PAsys=36 and we tried to wean his Revatio but he refused due to "other reasons", finally compromised on Revatio20Bid...    Hx CAP- Hosp 12/2015 w/ RLL opac (nos) & responded to broad spectrum Ab coverage + Sulomedrol, O2, Nebs, etc; readmitted 01/2016 w/ COPD exac- similar Rx but sent to NH for rehab at disch...    Cardiac issues>  ?nonobstructive CAD, cor pulmonale w/ mild pulmHTN & 2DEcho 09/2015 showing norm LVF w/ EF=55-60%, norm wall motion, mild MR, mild RA dil, PAsys est 75m11m..    Medical issues include:  HBP (on CardizemCD240), HL (on Cres10), hx thyroid nodule, GERD (on Nexium40), Constipation, BPH (on Proscar5 & Flomax0.4) w/ hx urinary retention, insomnia (on Restoril30)... EXAM shows Afeb, VSS, O2sat=93% on 2L/min pulse-dose;  Heent- neg, mallampati1;  Chest- decr BS at bases, can't augment BS voluntarily, w/o w/r/r;  Heart- RR w/o m/r/g;  Abd- soft, neg;  Ext- neg w/o c/c/e;  Neuro- intact...  CXR 05/08/16>  Mod bullous emphysema w/ chr changes at the lung bases w/ pleuroparenchymal scarring, surg suture lines ove the right mid lung...  LABS 04/2016 in epic> Chems- ok, BS=149;  CBC- anemia w/ Hg= 10.4-11.7, WBC=15K IMP/PLAN>>  Marv is stable on his baseline regimen + pulm rehab, etc; rec to continue current meds and exercise; he needs the 2017 Flu vaccine when avail & will call prn any breathing problems; we plan ROV in 5mo.145mo ADDENDUM>>  Pt Hosp by [facility] 8/11 - 07/01/16 w/  CAP after presenting w/ incr SOB, decr O2sats at pulm rehab, & chills 9he lives w/ school aged grandkids)- CXR showed ?RLL opac, cultures neg & NOS- treated empirically w/ Rocephin & Zithromax, Solumedrol, O2, NEBS, etc; he improved gradually & disch home;  He went to see ENT 8/29 because he thought the pneumonia may have been from reflux & dysphagia- fiberoptic exam revealed some edema & pooling of secretions c/w LPR=> placed on PPI Bid and referred to GI;  Barium esophagram  showed a [place] & mod GERD but no esophagitis mass or stricture;  He saw DrSchooler & had EGD 08/23/16 showing a Zebulon, patchy candida=> treated w/ Nystatin, later required Diflucan...   ADDENDUM>>  Pt had colonoscopy 08/23/16 by DrSchooler-- small HH, patchy candida, mild gastitis seen;  Treated w/ Nystatin, continue Prilosec40/d...   ~  November 25, 2016:  78moROV w/ SN>  Marv returns c/o 3d hx cough, lots of clear whitish sput, increased weakness and SOB "even on my oxygen"; symptoms started w/ a slight sore throat, hoarse, cough as noted but no f/c/s; he has been going to the Gym 5d per week to continue his exercise since graduating from the formal pulm rehab program (finished 07/2016) & is doing very well by his estimate (until this URI); he notes that 3d ago he had his usual 2H work out, went to tWellsite geologist then went to lunch & did all this w/o difficulty...      Severe COPD- GOLD Stage3, bullous emphysema, s/p resection of RUL bleb in 2007> on Advair250Bid & Spiriva daily; he has NEB w/ Duoneb for prn use; Spirometry 09/2015 w/ FEV1=1.12 (37%); he finished his PulmRehab 07/2016 & now exercises 5d/wk at the gym; he finally got his POC that he wanted...    Chronic hypoxemic respiratory failure on O2 at 2L/min via POC> ambulatory O2sats 09/2015 dropped to 89% on 2L/min after 2 Laps; repeat 11/2016 dropped to 85%after 1Lap...    Hx prob hypercarbic resp failure inferred from Pt report of BiPAP ([facility]) since 2007, never been on CPAP, never had sleep study, unknown ABGs or pCO2 data> we never received records from former physician teams or any of his old records...     Hx cor pulmonale/ pulmonary hypertension on Revatio20Tid since 2007 w/o additional med trials or dose adjustments> we do not have any of the objective data from his prev physician teams; 2DEcho here 09/2015 showed PAsys=36 and we tried to wean his Revatio but he refused due to "other reasons", finally compromised on Revatio20Bid...    Hx CAP-  Hosp 12/2015 w/ RLL opac (nos) & responded to broad spectrum Ab coverage + Solumedrol, O2, Nebs, etc; readmitted 01/2016 w/ COPD exac- similar Rx but sent to NH for rehab at disch...    Cardiac issues>  ?nonobstructive CAD, cor pulmonale w/ mild pulmHTN & 2DEcho 09/2015 showing norm LVF w/ EF=55-60%, norm wall motion, mild MR, mild RA dil, PAsys est 372mg...    Medical issues include:  HBP (on CardizemCD240), HL (on Cres10), hx thyroid nodule, GERD & prob LPR (on Nexium40), Constipation, BPH (on Proscar5 & Flomax0.4) w/ hx urinary retention, insomnia (on Restoril30)...       NOTE> pt is Iron deficient (see below) & needs to f/u w/ his PCP & GI for this...  EXAM shows Afeb, VSS, O2sat=92% on 3L/min;  Heent- neg, mallampati1;  Chest- decr BS at bases, can't augment BS voluntarily, w/o w/r/r;  Heart- RR w/o m/r/g;  Abd- soft, neg;  Ext- neg w/o  c/c/e;  Neuro- intact...  CXR 06/28/16>  Hyperinflation, marked emphysematous changes, crowed lung markings at the bases, no consolidation or effusions...  CXR 11/25/16>  COPD, bullous emphysema, and bilat pleuroparenchymal changes c/w scarring, no acute abnormality  Ambulatory O2sat on 3L/min pulse-dose POC>  O2sat=98% on 3L/min pulse-dose at rest w/ HR=90/min;  He ambulated only 1Lap (185') w/ O2sat drop to 85% w/ HR=108/min...  LABS 11/25/16>  Chems- wnl x BS=108;  A1c=6.1;  CBC- Hg=12.3, mcv=90, WBC=7.8K;  Fe=31 (6.6%sat) & Ferritin=8.7;  B12=661 IMP/PLAN>>  There is no such thing as a mild exac for MrBowman- even the mildest of URIs can cause a severe COPD exac- we discussed Rx w/ Levaquin500 x7d, Pred80m- 5d taper (see AVS), + Diflucan100 per his request; hopefully he will respond but he has little in the way of reversible factors given his severe emphysema; [facility] symptoms worsen despite therapy; as noted he is Fe defic & needs further eval per his PCP & GI- copies sent...  ~  December 23, 2016:  163moOV & post hospital  check>  [facility] by TrDiona Browner/ a COPD exac w/ acute on chronic resp failure;  Pt already on Home O2 at 2L/min and BiPAP Qhs; he had a root canal done, developed a sore throat, then cough, etc; Levaquin & Pred called -in for pt, Hosp for 4d w/ IV solumedrol & ultimately disch on Pred10... Currently c/o DOE w/ ADLs which is new to him as prev he was going to the gym etc...     He is currently taking Duoneb Q6H as needed + Advair250Bid, Spiriva via handihaler daily, Pred 1060mabs- slow taper... EXAM shows Afeb, VSS, O2sat=94% on 2L/min;  Heent- neg, mallampati1;  Chest- decr BS at bases, can't augment BS voluntarily, w/o w/r/r;  Heart- RR w/o m/r/g;  Abd- soft, neg;  Ext- neg w/o c/c/e;  Neuro- intact...  CXR 12/07/16 (independently reviewed by me in the PACS system)> norm heart size, calcif Ao, severe COPD w/ apical bullous changes & surg staples on right, NAD... Marland KitchenMarland KitchenKG 12/07/16 showed NSR, rate 87, poor R prog V1-2, otherw WNL... Marland KitchenMarland KitchenABS 11/2016> NOT retaining CO2 (ABG w/ pCO2=39;  Chems- ok;  CBC- ok w/ Hg=11-12 and wbc=21=>12;  BNP= wnl... IMP/PLAN>>  Marv had a COPD exac & required IV Solumedrol + in hosp attention w/ NEBS etc & now he is approaching his baseline; he called for an order for a Hosp bed but was referred to his PCP for this determination;  We reviewed the NEED for DUONEB Tid followed by Advair250Bid & Spiriva once daily; we will wean his Pred from 72m79mto 5mg/5m plan ROV receheck in 6wks...  NOTE: >50% of this 25min73m was spent in counseling & coordination of care...  ~  January 28, 2017:  6wk ROV & Yadkinable on meds + Pred 5mg/d;38me called 01/15/17 for antibiotics for an infected tooth- called in Augmentin & tooth has been extracted by dentist;  He remains on NEBs w/ Duoneb Tid, followed by Advair250Bid and Spiriva daily;  He reports using his Revatio "prn";  His breathing continues to improve, back in the gym 3d/wk, approaching his baseline he says;  Denies  cough, sput, hemoptysis, CP, etc... We reviewed his prob list as above...  EXAM shows Afeb, VSS, O2sat=96% on 2L/min;  Heent- neg, mallampati1;  Chest- decr BS at bases, can't augment BS voluntarily, w/o  w/r/r;  Heart- RR w/o m/r/g;  Abd- soft, neg;  Ext- neg w/o c/c/e;  Neuro- intact... IMP/PLAN>>  Marv continues to improve & move toward his baseline; Rec to continue Pred27m/d thru March then decr to 549mQod til return visit in 41m18mo  ~  May 01, 2017:  448mo7448mo & Marv reports a good 448mo 57morval w/o acute exac and w/o new complaints or concerns; he is back in the gym 3d/wk & back to baseline he says;  As prev noted Marv is on BiPAP & has been for yrs- [place] & I presume this was done for COPD & CO2 retention ant NOT because of sleep apnea; he does not know his settings 7 we have been unable to get old records indicating his initial parameters or follow up monitoring; cureent problem is that his machine is old, mask is 53yr o12yrno tubing supplies in a long time;  For his part he says he does NOT rest well, just in short intervals;  He goes to bed at 11PM using his BiPAP; wakes at 3-4AM & can't get back to sleep; maybe eats breakfast at 4AM and drifts back to sleep (w/o BiPAP) until 7AM or so; similarly he might nap for 1H during the day (w/o BiPAP); he currently thinks that the BiPAP makes no difference in his sleep & it would just as well suit him to stop it if able...  WE DISCUSSED REASSESSMENT w/ ABG on RA, and a HOME SLEEP TEST on RA to evaluate for sleep apnea and hypercarbic/ hypoxemic resp failure...     [facility] does his Home O2 (2L/min continuous) and [facility] does his nursing care    Marv also tells me he had a suspicious mole removed from his left post shoulder ~48mo ag69mox= melanoma, then wider excision [place]ERM[place] Severe COPD- GOLD Stage3, bullous emphysema, s/p resection of RUL bleb in 2007> on Advair250Bid & Spiriva  daily; he has NEB w/ Duoneb for prn use; he is off PRED now; Spirometry 09/2015 w/ FEV1=1.12 (37%); he finished his PulmRehab 07/2016 & now exercises regularly at the gym...    Chronic hypoxemic respiratory failure on O2 at 2L/min via POC> ambulatory O2sats 09/2015 dropped to 89% on 2L/min after 2 Laps; repeat 11/2016 dropped to 85% after 1Lap...    Hx prob hypercarbic resp failure inferred from Pt report of BiPAP ([facility]) since 2007, never been on CPAP, never had sleep study> we never received records from former physician teams or any of his old records; ABGs in EPIC> [facility] pH=7.44/ pCO2=35/ pO2=69 ?on RA; 11/2016 pH=7.44/ pCO2=39/ pO2=146 on 4Lnc;   => he has agreed to re-assessment here 2018 w/ ABG on RA and Home Sleep Study => pending...    Hx cor pulmonale/ pulmonary hypertension on Revatio20Tid since 2007 w/o additional med trials or dose adjustments> we do not have any of the objective data from his prev physician teams; 2DEcho here 09/2015 showed PAsys=36 and we tried to wean his Revatio but he refused due to "other reasons", finally compromised on Revatio20Bid...    Hx CAP- Hosp 12/2015 w/ RLL opac (nos) & responded to broad spectrum Ab coverage + Solumedrol, O2, Nebs, etc; readmitted 01/2016 w/ COPD exac- similar Rx but sent to NH for rehab at disch...    Cardiac issues> no cardiologist> ?nonobstructive CAD, cor pulmonale w/ mild pulmHTN & 2DEcho 09/2015 showing norm LVF w/ EF=55-60%, norm  wall motion, mild MR, mild RA dil, PAsys est 44mHg...    Medical issues> PCP= Dr. DLauretta GrillKoirala ([facility])>  HBP (on CardizemCD240), HL (on Cres10), hx thyroid nodule, GERD & prob LPR (GI= DrSchooler, on Prilosec40), Constipation, BPH (on Proscar5 & Flomax0.4) w/ hx urinary retention, insomnia (on Restoril30)...       NOTE> pt is Iron deficient & needs to f/u w/ his PCP & GI for this>> LABS 11/25/16>  Chems- wnl x BS=108;  A1c=6.1;  CBC- Hg=12.3, mcv=90, WBC=7.8K;  Fe=31 (6.6%sat) & Ferritin=8.7;   B12=661 EXAM shows Afeb, VSS, O2sat=90% on 2L/min pulse dose;  Heent- neg, mallampati1;  Chest- decr BS at bases, can't augment BS voluntarily, w/o w/r/r;  Heart- RR w/o m/r/g;  Abd- soft, neg;  Ext- neg w/o c/c/e;  Neuro- intact...  ABG on RA> pending (not done)  Home Sleep Test> pending (see below) IMP/PLAN>>  Marv has agreed to proceed w/ home sleep test to r/o OSA and we will further assess w/ ABG on RA to check for CO2 retention and any indication for BiPAP (doubt he will qualify at this time);  He will continue w/ his O2, Advair250, Spiriva, Duoneb vs ProventilHFA which he uses prn;  He does not appear to need the Revatio either but declines to discontinue this medication for other reasons.  ADDENDUM>>  Home Sleep Test done 05/20/17>  4H study showed mild OSA w/ AHI=6.0/hr;  Study done on RA-- lowest O2sat was 74% w/ an ave of 87%;  Pt given the option for in-lab CPAP titration study vs trial CPAP w/ autoset & a mask fit session w/ VLynnae Sandhoff  He prefers the latter & states he doesn't believe that he has OSA => we will arrange a mask fit session, the order a ResMed S10, air/ auto- set 5/15, w/ heated humidity, cliamate controlled tubing, enroll in airview w/ ROV in 6-8wks after starting for face to face and download... NOTE: he had the mask fit session w/ new FFM & O2 bleed-in at 3L/min; he was not willing to try the CPAP on Auto, says he requires BiPAP & therefore will need an in-lab CPAP/BiPAP titration test...  ~  July 31, 2017:  3676moOV & Marv had a Home Sleep Test 05/20/17- as above w/ AHI=6/hr & O2desat to 74% w/ ave 87%;  As noted he has been on BiPAP x yrs [place] to obtain any old records relating to his prev Dx of OSA & his need for BiPAP;  It is indeed unfortunate that he did not travel w/ [place] old [place] that he was INTOL to CPAP when tried  in the past;  The prob is that his old machine is wearing out & he is in need of a new machine- he is unable to afford one on his own;  I explained Medicare's rules for CPAP/ BiPAP & how he must follow their edicts about the repeat eval, and eventually the requirement for a download to prove compliance & efficacy-- in essence this means that he MUST have an in lab CPAP/BiPAP titration test, prove INTOL to CPAP to get the BiPAP then determine the BiPAP settings best for him;  This process is followed by a face to face visit in ~76m15mor download purposes...    NOTE: his PCP is DrKoirala ([employer], multimedia his GI is [facility]);  Pt saw ENT- Nordbladh,PA at [facility] on 06/24/17> c/o worsening dysphagia & nasal congestion, long hx GERD on PPI therapy, larynx exam showed polypoid changes of the cords, prom cricopharyngeus, and postglottic edema; prev EGD w/ smallHH, mod GERD; repeat laryngoscopy was essent neg- they ordered a Ba swallow, done 06/30/17 & showed sl retention in the valleculae that cleared w/ repeat swallowing, 67m barium tablet got lodged in the distal esoph & did not pass into the stomach; He again needs attention from his gastroenterologist for dilatation...  EXAM shows Afeb, VSS, O2sat=90% on 2L/min pulse dose;  Heent- neg, mallampati1;  Chest- decr BS at bases, can't augment BS voluntarily, w/o w/r/r;  Heart- RR w/o m/r/g;  Abd- soft, neg;  Ext- neg w/o c/c/e;  Neuro- intact... IMP/PLAN>>  As explained above we will order an in-lab CPAP/BiPAP titration study, and proceed from there...  ADDENDUM>>  Sleep Lab Titration study 10.18.18>  Optimal pressure= 7cmH2O w/ AHI at optimal pressure= 0/hr;  The study lasted 6.5H, no cardiac arrhythmias, no leg movements, he had O2desat lasting longer than 585m despite control in his sleep apnea w/ CPAP and 1L/min O2 was added to his CPAP;  He uses a small [employer] full face mask & heated humidification...  ~  September 22, 2017:  34m31moV & post-  hosp check>  [facility] w/ 4d hx incr cough, green sput, temp to 102 & incr SOB; ER eval revealed right basilar pneumonia, WBC=14.2, & O2sat=87% on 2L/min by [place];  He was treated w/ Zithromax & Rocephin, Solumedrol, Nebs, etc;  Cultures and serologies were all non-revealing & he was disch on Keflex, Pred, Nebs (Xopenex & Ipratripium), etc;  He also had PAF which converted spont & he was seen by [facility] and disch on Eliquis, Cardizem, Metoprolol... [facility]  We reviewed the following medical problems during today's office visit>    Severe COPD- GOLD Stage3, bullous emphysema, s/p resection of RUL bleb in 2007> on Advair250Bid & Spiriva daily; he has NEB w/  Xopenex & Atrovent for prn use; he is off PRED now; Spirometry 09/2015 w/ FEV1=1.12 (37%); he finished his PulmRehab 07/2016 & now exercises regularly at the gym => we re-started Pred 74m33mpering sched down to 1/2 Qod by return visit...    Chronic hypoxemic respiratory failure on O2 at 2L/min via POC> ambulatory O2sats 09/2015 dropped to 89% on 2L/min after 2 Laps; repeat 11/2016 dropped to 85% after 1Lap... He uses O2 at 1L/min Qhs and 2L/min w/ activ.    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    Hx cor pulmonale/ pulmonary hypertension on Revatio20Tid since 2007 w/o additional med trials or dose adjustments> we do not have any of the objective data from his prev physician teams; 2DEcho here 09/2015 showed PAsys=36 and we tried to wean his Revatio but he refused due to "other reasons", finally compromised on Revatio20Bid...    Hx CAP- Hosp 2/17 & 10/18 w/  RLL opac (nos) & responded to broad spectrum Ab coverage + Solumedrol, O2, Nebs, etc; readmitted 01/2016 w/ COPD exac- similar Rx but  sent to NH for rehab at [place]...    Cardiac issues> ?nonobstructive CAD, cor pulmonale w/ mild pulmHTN & 2DEcho 09/2015 showing norm LVF w/ EF=55-60%, norm wall motion, mild MR, mild RA dil, PAsys est 73mHg...episode PAP 10/18 & converted spont- disch from hosp on Eliquis, Metoprolol, Cardizem & f/u w/ [facility]-DrRandolph pending...    Medical issues> PCP= Dr. DLauretta GrillKoirala ([facility])>  HBP (on CardizemCD240), HL (on Cres10), hx thyroid nodule, GERD & prob LPR (GI= DrSchooler, on Prilosec40), Constipation, BPH (on Proscar5 & Flomax0.4) w/ hx urinary retention, anemia & insomnia (on Restoril30)... EXAM shows Afeb, VSS, O2sat=90% on 2L/min pulse dose;  Heent- neg, mallampati1;  Chest- decr BS at bases, can't augment BS voluntarily, w/o w/r/r;  Heart- RR w/o m/r/g;  Abd- soft, neg;  Ext- neg w/o c/c/e;  Neuro- intact...  CXR 09/09/17>  Norm heart size, interval patchy opac & effusion at right base, underlying COPD...  2DEcho 09/10/17>  Norm LV size & function w/ EF=55-60%, no regional wall motion abn, valves are OK, PAsys=334mg  Mod barium swallow 09/10/17> mild pharyngeal dysphagia due to known esoph issues, mild asp risk, swallowing strategies outlined to pt...  LABS in Epic 08/2017>  Chems- ok BS~130, Cr=0.81, Alb=2.7, sl elev LFTs noted;  CBC- anemic w/ Hg=10.9;  TSH=0.52...   CXR 09/22/17 (independently reviewed by me in the PACS system) showed norm heart size, underlying COPD/emphysema, near complete clearing of the right basilar infiltrate... IMP/PLAN>>  We decided to continue his low dose Pred rx w/ 60m48mod;  Continue other meds as prev including NEBs w/ Duoneb Tid followed by Advair250Bid & Spiriva once daily, Mucinex 1-2Bid, Tessalon tid as needed... We plan rov recheck in 6 wks...  ~  November 04, 2017:  6[place] improved and approaching his  baseline- still w/ SOB/DOE but ADLs improved, cough/ phlegm stable, no CP/ edema; on Duoneb tid, Advair250Bid & spiriva daily; on Mucinex, MMW, Tessalon as needed; Pred weaned to 1/2 Qod;  ?hold-up from [employer] his new ResMed S10 CPAP set up as above...     He saw [facility]-DrRandolph 09/22/17>  HBP, PAF, HL;  On Eliquis, Metoprolol, Cardizem; Thyroid wnl & Echo w/ EF=55-60%, PAsys=33; felt to be stable- no changes made...     We reviewed his pulm issues/ medical problems during today's office visit-- see above... we do not have notes/ data from [facility]... EXAM shows Afeb, VSS, O2sat=96% on 2L/min pulse dose;  Heent- neg, mallampati1;  Chest- decr BS at bases, can't augment BS voluntarily, w/o w/r/r;  Heart- RR w/o m/r/g;  Abd- soft, neg;  Ext- neg w/o c/c/e;  Neuro- intact... IMP/PLAN>>  We discussed slowly weaning the Pred down to 60mg41md & continue this til ROV in 91mo;67mo will watch BP/ pulse state w/ an eye towards weaning the BBlocker if able;  His appetite is improved, wt up 7#, and back in the gym daily!  ~  December 16, 2017:  6wk ROV & add-on appt requested for cough, yellow sput, dyspnea>  Marv weaned the Pred to 60mg Q66m& then noticed grad increase in symptoms- cough w/ yellow sput, incr SOB w/ min exertion, etc;  He had ret to the gym & was doing satis until this deterioration;  He uses his CPAP Qhs- rests well, wakes feeling rested, denies daytime hypersomnolence but still naps 2-3/7, states driving is ok w/o drowsiness etc...     Severe COPD/ emphysema w/ bullous dis & prev RUL bleb  resected 2007> on Advair115 & Spiriva daily + Xopenex&Atrovent via NEB prn, and Pred5Qod...    Chr hypoxemic resp failure on O2 at 2L/min via POC...    OSA- on CPAP7 & 1L/min O2 bleed-in.Marland KitchenMarland Kitchen Download 12/30-1/28/19 showed good compliance (30/30d, ave 7+H per night, on CPAP7 w/ min intermit leak, & good efficacy w/ AHI=1.4.Marland KitchenMarland Kitchen     Hx cor pulmonale/ pulmonary hypertension on Revatio20Tid since  2007 w/o additional med trials or dose adjustments=> we cut him to bid w/o any untoward effect but he refused to wean further... EXAM shows Afeb, VSS, O2sat=90% on 2L/min pulse dose;  Heent- neg, mallampati1;  Chest- decr BS at bases, can't augment BS voluntarily, w/o w/r/r;  Heart- RR w/o m/r/g;  Abd- soft, neg;  Ext- neg w/o c/c/e;  Neuro- intact... IMP/PLAN>>  We decided to treat his COPD exac w/ DOXY 166m Bid, and an incr Pred 531mtabs to 2tabs Qam for 1wk, then taper to 2-1 Qod, then 2-0 Qod til return;  Asked to continue NEBS Tid followed by Advair-Bid & Spiriva-Qd, + Mucinex600Bid, etc...   ~  February 02, 2018:  6wk ROV & post hospital follow up>  Marv was HOSP 3/2 - 01/21/18 by [facility] w/ a COPD exacerbation- presented w/ productive cough, wheezing, incr SOB, feeling bad w/ low grade fever; he was on Pred1054md, NEBS w/ Duoneb Tid, Advair250Bid, SpirivaQd, Mucinex, Fluids, etc=> in Hosp eval revealed no acute CXR changes, Hg=11.2=>9.0, WBC=12.5, Chems- ok, cultures all neg (Flu neg, Legionella neg, Pneumococcal neg); he had been exposed to Flu & covered w/ Tamiflu rx; given Rocephin/ Zithromax/ IV Solumedrol=> disch on Pred taper...  We reviewed the following medical problems during today's office visit>    Severe COPD- GOLD Stage3, bullous emphysema, s/p resection of RUL bleb in 2007> on Pred 75m47m Advair250Bid & Spiriva daily; he has NEB w/ Duoneb to use Tid; Spirometry 09/2015 w/ FEV1=1.12 (37%); he finished his PulmRehab 07/2016 & now exercises regularly at the gym =>     Chronic hypoxemic respiratory failure on O2 at 2L/min via POC> ambulatory O2sats dropped on RA & he uses O2 at 1L/min Qhs and 2L/min w/ activ...    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     Hx cor pulmonale/ pulmonary hypertension on Revatio20Tid since 2007 w/o additional med trials or dose adjustments> we do not have any of the objective data from his prev physician teams; 2DEcho here 09/2015 showed PAsys=36 and we tried to wean his Revatio but he refused due to "other reasons", finally compromised on Revatio20Bid...    Hx CAP- Hosp 2/17 & 10/18 w/ RLL opac (nos) & responded to broad spectrum Ab coverage + Solumedrol, O2, Nebs, etc; readmitted 01/2016 w/ COPD exac- similar Rx but sent to NH for rehab at disch...    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    Medical  issues> PCP= Dr. Lauretta Grill Koirala ([facility])>  HBP (on CardizemCD240), HL (on Cres10), hx thyroid nodule, GERD & prob LPR (GI= DrSchooler, on Prilosec40), Constipation, BPH (on Proscar5 & Flomax0.4) w/ hx urinary retention, Anemia w/ iron dific(PCP eval in progress) & insomnia (on Restoril30)... NOTE:  He's been anemic and Fe has been low- pt tells me that his PCP DrKoirala has done post-hosp blood work & is taking care of his anemia work-up! EXAM shows Afeb, VSS, O2sat=95% on 2L/min pulse dose;  Heent- neg, mallampati1;  Chest- decr BS at bases, can't augment BS voluntarily, w/o w/r/r;  Heart- RR w/o m/r/g;  Abd- soft, neg;  Ext- neg w/o c/c/e;  Neuro- intact...  EKG 02/02/18>  PVCs and ventric bigeminy => refer to cards for further eval & rx...   CXR 01/17/18>  Norm heart size, hyperinflation/ bullous emphysema (esp RUL), no acute abn...  LABS 01/2018 in Hosp>  Chems- ok x HCO3=20, BS=178, Cr=0.95,  LFTs ok;  CBC- Hg 11.2=>9.0, WBC=10.2;   IMP/PLAN>>  Marv's pulse was recorded at 34 on arrival but this was because of his bigeminy- he is asymptomatic (doesn't note skips, irreg, fluttering, dizzy, etc); on Eliquis, Metoprolol, Cardizem and we will refer back to [facility] for further eval & Rx... REC to continue O2, Pred 70m/d + DUONEB, ADVAIR, SPIRIVA, Mucinex, fluids... We plan recheck in 163mo  ~  March 02, 2018:  72m66moV & when last seen 72mo4mo Marv was in bigeminy yet asymptomatic & we referred him to [facility]- he saw HMeng,PA (for [employer] 02/10/18> known hx HBP, PAF, HL, mild OSA on CPAP & borderline PulmHTN (he has been on Revatio for yrs & refuses to wean this med further); he had PAF 10/18 when hosp for pneumonia, disch on Eliquis, Metoprolol, Cardizem; currently in NSR w/ PVCs and bigeminy- they incr his Cardizem to 180mg63m plan Holter monitor w/ f/u by DrRandolph...     Pulm status is stable today> on O2 at 1L/min rest & 2L/min w/ exercise; on Pred 10mg/60mdvair250Bid & Spiriva daily; he has NEB w/ Duoneb to use Tid; on CPAP7 Qhs & doing satis he says...    He has numerous medical issues and his PCP is DrKoirala- concern for his persistent normochromic anemia & pt says there has been no additional evaluation done; we discussed the need for f/u CBC, Fe, B12, SPE/IEP & he requested for us to [place] these needed tests today... EXAM shows Afeb, VSS, O2sat=95% on 2L/min pulse dose;  Heent- neg, mallampati1;  Chest- decr BS at bases, can't augment BS voluntarily, w/o w/r/r;  Heart- RR w/o m/r/g;  Abd- soft, neg;  Ext- neg w/o c/c/e;  Neuro- intact...  LABS 03/02/18>  Chems- wnl w/ BS=103, Cr=0.92, LFTs wnl;  CBC- anemic w/ Hg=10.6, mcv=88;  Fe=27 (6%sat), Ferritin=8.6;  B12=638, Folate>24, SPE/IEP- neg (no monoclonal prot);  Stool is POS for hidden blood... 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 Hx PAF, +PVCs being [place] by [facility] [place] on Eliquis, Metoprolol, Cardizem...   NOTE:  >50% of this 25 min rov was spent in counseling & coordination of care...    Past Medical History:  Diagnosis Date  . BiPAP (biphasic positive airway pressure) dependence    Pt denies history of OSA  . BPH (benign prostatic hyperplasia)   .  Cancer ([place])    skin  . COPD (chronic obstructive pulmonary disease) ([place])   . Dysphagia   . Emphysema lung ([place])   . GERD (gastroesophageal reflux disease)    " Silent reflux"  . Hearing loss    right ear  . HLD (hyperlipidemia)   . Hypertension   . Oxygen dependent    2-3 liters  . Oxygen dependent   . Pneumonia   . Pulmonary hypertension ([facility])   Medical Hx>  HBP, ?nonobstructive CAD, HL, thyroid nodule, GERD, constipation, BPH, insomnia... Meds include>  CardizemCD240, Crestor10, Nexium20, Miralax, Proscar10, Restoril30...   Past Surgical History:  Procedure Laterality Date  . CARDIAC CATHETERIZATION    . CATARACT EXTRACTION W/ INTRAOCULAR LENS  IMPLANT, BILATERAL    . ESOPHAGOGASTRODUODENOSCOPY (EGD) WITH PROPOFOL N/A 08/23/2016   Procedure: ESOPHAGOGASTRODUODENOSCOPY (EGD) WITH PROPOFOL;  Surgeon: Wilford Corner, MD;  Location: [facility] ENDOSCOPY;  Service: Endoscopy;  Laterality: N/A;  . LUNG SURGERY    . TONSILLECTOMY    Hx Thoracotomy & RUL Bleb-ectomy 2007 in [place], [place]...   Outpatient Encounter Medications as of 03/02/2018  Medication Sig  . ADVAIR DISKUS 250-50 MCG/DOSE AEPB Inhale 1 puff into the lungs 2 (two) times daily.  Marland Kitchen albuterol (PROVENTIL HFA;VENTOLIN HFA) 108 (90 Base) MCG/ACT inhaler Inhale 2 puffs into the lungs every 6 (six) hours as needed for wheezing or shortness of breath. (Patient taking differently: Inhale 2 puffs into the lungs daily. )  . apixaban (ELIQUIS) 5 MG TABS tablet Take 1 tablet (5 mg total) by mouth 2 (two) times daily.  . benzonatate (TESSALON)  100 MG capsule Take 1 capsule (100 mg total) 3 (three) times daily by mouth. (Patient taking differently: Take 100 mg by mouth 2 (two) times daily. )  . diltiazem (CARDIZEM CD) 180 MG 24 hr capsule Take 1 capsule (180 mg total) by mouth daily.  . finasteride (PROSCAR) 5 MG tablet Take 5 mg by mouth at bedtime.   . fluticasone (FLONASE) 50 MCG/ACT nasal spray Place 2 sprays into the nose daily as needed.   Marland Kitchen guaiFENesin (MUCINEX) 600 MG 12 hr tablet Take 1 tablet (600 mg total) by mouth 2 (two) times daily. (Patient taking differently: Take 1,200 mg by mouth daily. )  . hydrocortisone cream 1 % Apply 1 application topically daily as needed (Eczema on face and head).  Marland Kitchen ipratropium-albuterol (DUONEB) 0.5-2.5 (3) MG/3ML SOLN Take 3 mLs by nebulization 3 (three) times daily.  . metoprolol tartrate (LOPRESSOR) 25 MG tablet Take 1 tablet (25 mg total) by mouth 2 (two) times daily.  . Multiple Vitamins-Minerals (MULTIVITAMIN WITH MINERALS) tablet Take 1 tablet by mouth daily.  Marland Kitchen omeprazole (PRILOSEC) 40 MG capsule Take 40 mg by mouth daily.  . OXYGEN Inhale 2-3 L into the lungs See admin instructions. 2L daytime, 3L at night   . polyethylene glycol (MIRALAX / GLYCOLAX) packet Take 17 g by mouth daily. (Patient taking differently: Take 17 g by mouth at bedtime. Mix in 8 oz liquid and drink)  . predniSONE (DELTASONE) 10 MG tablet Take 1 tablet (10 mg total) by mouth daily. (Patient taking differently: Take 10 mg by mouth daily. 1 75m  in the morning and 1 5 mg at night)  . rosuvastatin (CRESTOR) 10 MG tablet Take 10 mg by mouth daily.  . sildenafil (REVATIO) 20 MG tablet Take 1 tablet (20 mg total) by mouth 2 (two) times daily.  .Marland KitchenSPIRIVA HANDIHALER 18 MCG inhalation capsule INHALE THE CONTENTS OF 1 CAPSULE EVERY DAY  .  tamsulosin (FLOMAX) 0.4 MG CAPS capsule Take 1 capsule (0.4 mg total) by mouth daily after breakfast. (Patient taking differently: Take 0.4 mg by mouth at bedtime. )  . temazepam (RESTORIL)  15 MG capsule Take 1 capsule (15 mg total) by mouth at bedtime as needed for sleep (1-2 tabs as needed).  . vitamin C (ASCORBIC ACID) 500 MG tablet Take 500 mg by mouth daily.  . [DISCONTINUED] diltiazem (CARDIZEM CD) 180 MG 24 hr capsule Take 1 capsule (180 mg total) by mouth daily.  . [DISCONTINUED] HYDROcodone-acetaminophen (NORCO/VICODIN) 5-325 MG tablet Take 1 tablet by mouth at bedtime as needed for moderate pain or severe pain.  . [DISCONTINUED] predniSONE (DELTASONE) 20 MG tablet Take 78m daily for 2days then 266mdaily for 2days then STOP   No facility-administered encounter medications on file as of 03/02/2018.     No Known Allergies   Immunization History  Administered Date(s) Administered  . Influenza, High Dose Seasonal PF 07/22/2016, 08/18/2017  . Influenza-Unspecified 09/19/2015  . PPD Test 02/15/2016  . Pneumococcal Conjugate-13 09/19/2015  . Pneumococcal Polysaccharide-23 12/20/2016    Current Medications, Allergies, Past Medical History, Past Surgical History, Family History, and Social History were reviewed in [employer].   Review of Systems             All symptoms NEG except where BOLDED >>  Constitutional:  F/C/S, fatigue, anorexia, unexpected weight change. HEENT:  HA, visual changes, hearing loss, earache, nasal symptoms, sore throat, mouth sores, hoarseness. Resp:  cough, sputum, hemoptysis; SOB, tightness, wheezing. Cardio:  CP, palpit, DOE, orthopnea, edema. GI:  N/V/D/C, blood in stool; reflux, abd pain, distention, gas. GU:  dysuria, freq, urgency, hematuria, flank pain, voiding difficulty. MS:  joint pain, swelling, tenderness, decr ROM; neck pain, back pain, etc. Neuro:  HA, tremors, seizures, dizziness, syncope, weakness, numbness, gait abn. Skin:  suspicious lesions or skin rash. Heme:  adenopathy, bruising, bleeding. Psyche:  confusion, agitation, sleep disturbance, hallucinations, anxiety, depression  suicidal.   Objective:   Physical Exam       Vital Signs:  Reviewed...   General:  WD, WN, 7828/o WM in NAD; alert & oriented; pleasant & cooperative... HEENT:  [place]/AT; Conjunctiva- pink, Sclera- nonicteric, EOM-wnl, PERRLA, EACs-clear, TMs-wnl; NOSE-clear; THROAT-clear & wnl.  Neck:  Supple w/ fair ROM; no JVD; normal carotid impulses w/o bruits; no thyromegaly +small nodule palpated; no lymphadenopathy.  Chest:  Overinflated, resonant percussion note, decr BS bilat & can't augment BS voluntarily, no w/r/r heard... Heart:  Regular Rhythm; norm S1 & S2 without murmurs, rubs, or gallops detected. Abdomen:  Soft & nontender- no guarding or rebound; normal bowel sounds; no organomegaly or masses palpated. Ext:  decr ROM; without deformities or arthritic changes; no varicose veins, +venous insuffic, no edema;  Pulses intact w/o bruits. Neuro:  No focal neuro deficits; sensory testing normal; gait normal & balance OK. Derm:  No lesions noted; no rash etc. Lymph:  No cervical, supraclavicular, axillary, or inguinal adenopathy palpated.   Assessment:      IMP >>     Severe COPD- GOLD Stage3, bullous emphysema, s/p resection of RUL bleb in 2007> on Advair250Bid & Spiriva daily; he has NEB w/ Duoneb for prn use; Spirometry 09/2015 w/ FEV1=1.12 (37%); he has completed pulm rehab & now exercises at the gym 5d/wk;  He's had mult COPD exac => treat w/ Levaquin, Pred taper, continue Advair/ spiriva/ NEBS/ O2...    Chronic hypoxemic respiratory failure on O2 at 2L/min via  POC> ambulatory O2sats 09/2015 dropped to 89% on 2L/min after 2 Laps; he dropped to 85% after 1Lap on 3L/min POC 11/2016...    Hx prob hypercarbic resp failure inferred from Pt report of BiPAP ([facility]) since 2007, never been on CPAP, never had sleep study, unknown ABGs or pCO2 data> we never received data from his prev physicians.    Hx cor pulmonale/ pulmonary hypertension on Revatio20Tid since 2007 w/o additional med trials or dose  adjustments> we do not have any of the objective data from his prev physician teams; 2DEcho here 09/2015 showed PAsys=36 and we tried to wean his Revatio but he refused due to "other reasons", finally compromised on Revatio20Bid...    Hx CAP- Hosp 12/2015 w/ RLL opac (nos) & responded to broad spectrum Ab coverage + Sulomedrol, O2, Nebs, etc; readmitted 01/2016 w/ COPD exac- similar Rx but sent to NH for rehab at disch...    Hx of being on Home BiPAP since 2007 w/ eval & set up in [place]> we have been unable to obtain any old records regarding his initial studies and BiPAP set up...    Cardiac issues>  ?nonobstructive CAD, cor pulmonale w/ mild pulmHTN & 2DEcho 09/2015 showing norm LVF w/ EF=55-60%, norm wall motion, mild MR, mild RA dil, PAsys est 69mHg...    Medical issues include:  HBP (on CardizemCD240), HL (on Cres10), hx thyroid nodule, GERD (on Nexium40), constipation, BPH (on Proscar5 & Flomax0.4) w/ hx urinary retention, insomnia (on Restoril30)...       NOTE>  He is Iron deficient & need further eval & Rx from his PCP & GI...  PLAN >>  10/02/15>   MCorniehas a distinctly patchy history to go along w/ his severe airflow obstruction & bullous emphysema;  We really need his old objective data from 2007 when he was started on O2 & BiPAP after his RUL bleb reduction surg;  He will try to get names and numbers for [place] in the meanwhile [facility] as we [place] out data base [place]  He is very concerned that he wants u[place]ato continue his current regimen which has served him well over the last 963yr  Continue Advair250Bid, Spiriva daily, Revatio20tid, O2 at 2L/min, and the BiPAP nightly as currently set...  11/01/15>  See prob list above- he is stable on this regimen but does not want to wean the Revatio further for "other" reasons; OK to continue current med regimen- advised regular exercise vs pulm rehab program; given ZPak  for prn use over the winter & he knows to call for any resp issues, incr dyspnea, etc... He remains on BiPAP but we do not have any data- no records received from prev pulm physicians, no notes from [place] download data from his machine- we will again try to make contact w/ his DME company... 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 06/20/16>   Marv is stable on his baseline regimen + pulm rehab, etc; rec to continue current meds and exercise; he needs the 2017 Flu vaccine when avail & will call  prn any breathing problems; we plan ROV in 60mo..  11/25/16>   There is no such thing as a mild exac for MrBowman- even the mildest of URIs can cause a severe COPD exac- we discussed Rx w/ Levaquin500 x7d, Pred230m 5d taper (see AVS), + Diflucan100 per his request; hopefully he will respond but he has little in the way of reversible factors given his severe emphysema; [facility] worsen despite therapy; as noted he is Fe defic & needs further eval per his PCP & GI- copies sent. => subseq Hosp for 4d w/ IV Solumedrol, NEBs/ O2/ etc... 05/01/17>   Marv has agreed to proceed w/ home sleep test to r/o OSA and we will further assess w/ ABG on RA to check for CO2 retention and any indication for BiPAP (doubt he will qualify at this time);  He will continue w/ his O2, Advair250, Spiriva, Duoneb vs ProventilHFA which he uses prn;  He does not appear to need the Revatio either but declines to discontinue this medication for other reasons; 07/31/17>   As explained above we will order an in-lab CPAP/BiPAP titration study, and proceed from there. 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  09/22/17>   We decided to continue his low dose Pred rx w/ 63m12mod;  Continue other meds as prev including NEBs w/ Duoneb Tid followed by Advair250Bid & Spiriva once daily, Mucinex 1-2Bid, Tessalon tid as needed... We plan rov recheck in 6 wks. 11/04/17>   We discussed slowly weaning the Pred down to 63mg40md & continue this til ROV in 47mo;32mo will watch BP/ pulse state w/ an eye towards weaning the BBlocker if able;  His appetite is improved, wt up 7#, and back in the gym daily! 12/16/17>   We decided to treat his COPD exac w/ DOXY 100mg 57m and an incr Pred 63mg ta24mto 2tabs Qam for 1wk, then taper to 2-1 Qod, then 2-0 Qod til return;  Asked to continue NEBS Tid followed by Advair-Bid & Spiriva-Qd, + Mucinex600Bid, etc. 02/02/18>   Marv's pulse was recorded at 34 on arrival but this was because of his bigeminy- he is asymptomatic (doesn't note skips, irreg, fluttering, dizzy, etc); on Eliquis, Metoprolol, Cardizem and we will refer back to [facility] for further eval & Rx... REC to continue O2, Pred 10mg/d 67mONEB, ADVAIR, SPIRIVA, Mucinex, fluids... We plan recheck in 95mo 4/1549mo   Marv's eval confirms the Dx of iron defic anemia and he has heme pos stools=> we will start Rx w/ FeSO4 3263mg + Vi43m0 daily & refer to his GI- DrSchooler for further eval... Hx PAF, +PVCs being evaluated by [facility]- DrRandolIvar Drapeins on Eliquis, Metoprolol, Cardizem   Plan:     Patient's Medications  New Prescriptions   No medications on file  Previous Medications   ADVAIR DISKUS 250-50 MCG/DOSE AEPB    Inhale 1 puff into the lungs 2 (two) times daily.   ALBUTEROL (PROVENTIL HFA;VENTOLIN HFA) 108 (90 BASE) MCG/ACT INHALER    Inhale 2 puffs into the lungs every 6 (six) hours as needed for wheezing or shortness of breath.   APIXABAN (ELIQUIS) 5 MG TABS  TABLET    Take 1 tablet (5 mg total) by mouth 2 (two) times daily.   BENZONATATE (TESSALON) 100  MG CAPSULE    Take 1 capsule (100 mg total) 3 (three) times daily by mouth.   FINASTERIDE (PROSCAR) 5 MG TABLET    Take 5 mg by mouth at bedtime.    FLUTICASONE (FLONASE) 50 MCG/ACT NASAL SPRAY    Place 2 sprays into the nose daily as needed.    GUAIFENESIN (MUCINEX) 600 MG 12 HR TABLET    Take 1 tablet (600 mg total) by mouth 2 (two) times daily.   HYDROCORTISONE CREAM 1 %    Apply 1 application topically daily as needed (Eczema on face and head).   IPRATROPIUM-ALBUTEROL (DUONEB) 0.5-2.5 (3) MG/3ML SOLN    Take 3 mLs by nebulization 3 (three) times daily.   METOPROLOL TARTRATE (LOPRESSOR) 25 MG TABLET    Take 1 tablet (25 mg total) by mouth 2 (two) times daily.   MULTIPLE VITAMINS-MINERALS (MULTIVITAMIN WITH MINERALS) TABLET    Take 1 tablet by mouth daily.   OMEPRAZOLE (PRILOSEC) 40 MG CAPSULE    Take 40 mg by mouth daily.   OXYGEN    Inhale 2-3 L into the lungs See admin instructions. 2L daytime, 3L at night    POLYETHYLENE GLYCOL (MIRALAX / GLYCOLAX) PACKET    Take 17 g by mouth daily.   PREDNISONE (DELTASONE) 10 MG TABLET    Take 1 tablet (10 mg total) by mouth daily.   ROSUVASTATIN (CRESTOR) 10 MG TABLET    Take 10 mg by mouth daily.   SILDENAFIL (REVATIO) 20 MG TABLET    Take 1 tablet (20 mg total) by mouth 2 (two) times daily.   SPIRIVA HANDIHALER 18 MCG INHALATION CAPSULE    INHALE THE CONTENTS OF 1 CAPSULE EVERY DAY   TAMSULOSIN (FLOMAX) 0.4 MG CAPS CAPSULE    Take 1 capsule (0.4 mg total) by mouth daily after breakfast.   TEMAZEPAM (RESTORIL) 15 MG CAPSULE    Take 1 capsule (15 mg total) by mouth at bedtime as needed for sleep (1-2 tabs as needed).   VITAMIN C (ASCORBIC ACID) 500 MG TABLET    Take 500 mg by mouth daily.  Modified Medications   Modified Medication Previous Medication   DILTIAZEM (CARDIZEM CD) 180 MG 24 HR CAPSULE diltiazem (CARDIZEM CD) 180 MG 24 hr capsule      Take 1  capsule (180 mg total) by mouth daily.    Take 1 capsule (180 mg total) by mouth daily.  Discontinued Medications   HYDROCODONE-ACETAMINOPHEN (NORCO/VICODIN) 5-325 MG TABLET    Take 1 tablet by mouth at bedtime as needed for moderate pain or severe pain.   PREDNISONE (DELTASONE) 20 MG TABLET    Take 61m daily for 2days then 250mdaily for 2days then STOP

## 2018-03-03 NOTE — Telephone Encounter (Signed)
Will give SN the message.

## 2018-03-04 ENCOUNTER — Other Ambulatory Visit: Payer: Self-pay | Admitting: Pulmonary Disease

## 2018-03-04 DIAGNOSIS — E611 Iron deficiency: Secondary | ICD-10-CM

## 2018-03-05 LAB — PROTEIN ELECTROPHORESIS, SERUM, WITH REFLEX
ALBUMIN ELP: 3.7 g/dL — AB (ref 3.8–4.8)
Alpha 1: 0.4 g/dL — ABNORMAL HIGH (ref 0.2–0.3)
Alpha 2: 0.8 g/dL (ref 0.5–0.9)
Beta 2: 0.4 g/dL (ref 0.2–0.5)
Beta Globulin: 0.5 g/dL (ref 0.4–0.6)
Gamma Globulin: 0.7 g/dL — ABNORMAL LOW (ref 0.8–1.7)
TOTAL PROTEIN: 6.4 g/dL (ref 6.1–8.1)

## 2018-03-05 LAB — IFE INTERPRETATION: IMMUNOFIX ELECTR INT: NOT DETECTED

## 2018-03-05 NOTE — Telephone Encounter (Signed)
Received Rx refill request from pharmacy for tessalon pearls. This medication was last prescribed in 09/22/18 with 4 refills. Please advise if you like to refill this medication.

## 2018-03-09 ENCOUNTER — Other Ambulatory Visit: Payer: Self-pay | Admitting: Pulmonary Disease

## 2018-03-09 DIAGNOSIS — R531 Weakness: Secondary | ICD-10-CM

## 2018-03-10 ENCOUNTER — Other Ambulatory Visit (INDEPENDENT_AMBULATORY_CARE_PROVIDER_SITE_OTHER): Payer: Medicare Other

## 2018-03-10 ENCOUNTER — Other Ambulatory Visit: Payer: Self-pay | Admitting: Pulmonary Disease

## 2018-03-10 DIAGNOSIS — D649 Anemia, unspecified: Secondary | ICD-10-CM

## 2018-03-10 DIAGNOSIS — E611 Iron deficiency: Secondary | ICD-10-CM

## 2018-03-10 LAB — FECAL OCCULT BLOOD, IMMUNOCHEMICAL: FECAL OCCULT BLD: POSITIVE — AB

## 2018-03-12 ENCOUNTER — Other Ambulatory Visit: Payer: Self-pay | Admitting: Pulmonary Disease

## 2018-03-12 DIAGNOSIS — D508 Other iron deficiency anemias: Secondary | ICD-10-CM

## 2018-03-13 DIAGNOSIS — R195 Other fecal abnormalities: Secondary | ICD-10-CM | POA: Diagnosis not present

## 2018-03-13 DIAGNOSIS — D509 Iron deficiency anemia, unspecified: Secondary | ICD-10-CM | POA: Diagnosis not present

## 2018-03-13 DIAGNOSIS — J439 Emphysema, unspecified: Secondary | ICD-10-CM | POA: Diagnosis not present

## 2018-03-13 DIAGNOSIS — I482 Chronic atrial fibrillation: Secondary | ICD-10-CM | POA: Diagnosis not present

## 2018-03-13 DIAGNOSIS — K219 Gastro-esophageal reflux disease without esophagitis: Secondary | ICD-10-CM | POA: Diagnosis not present

## 2018-03-23 ENCOUNTER — Ambulatory Visit (INDEPENDENT_AMBULATORY_CARE_PROVIDER_SITE_OTHER): Payer: Medicare Other | Admitting: Cardiovascular Disease

## 2018-03-23 ENCOUNTER — Encounter: Payer: Self-pay | Admitting: *Deleted

## 2018-03-23 ENCOUNTER — Encounter: Payer: Self-pay | Admitting: Cardiovascular Disease

## 2018-03-23 VITALS — BP 110/64 | HR 53 | Ht 67.0 in | Wt 142.6 lb

## 2018-03-23 DIAGNOSIS — E785 Hyperlipidemia, unspecified: Secondary | ICD-10-CM | POA: Diagnosis not present

## 2018-03-23 DIAGNOSIS — J449 Chronic obstructive pulmonary disease, unspecified: Secondary | ICD-10-CM

## 2018-03-23 DIAGNOSIS — I480 Paroxysmal atrial fibrillation: Secondary | ICD-10-CM | POA: Diagnosis not present

## 2018-03-23 DIAGNOSIS — G4733 Obstructive sleep apnea (adult) (pediatric): Secondary | ICD-10-CM

## 2018-03-23 DIAGNOSIS — I10 Essential (primary) hypertension: Secondary | ICD-10-CM | POA: Diagnosis not present

## 2018-03-23 NOTE — Progress Notes (Signed)
Cardiology Office Note   Date:  03/23/2018   ID:  Andrew [name] Andrew [name] [name], DOB [birth year], MRN [id number]  PCP:  Lujean Amel, MD  Cardiologist:   Skeet Latch, MD   Chief Complaint  Patient presents with  . Follow-up     History of Present Illness: Andrew [name] Andrew [name] [name] is a 77 y.o. male with paroxysmal atrial fibrillation, COPD, pulmonary hypertension, OSA on BiPAP, hypertension, and hyperlipidemia who presents for a follow-up.  Andrew [name] Andrew [name] [name] was admitted 08/2017 with pneumonia.  The hospitalization was complicated by atrial fibrillation with rapid ventricular response.  He had more than one episode of atrial fibrillation during the admission.  It was thought to be due to his pneumonia.  He was already on diltiazem prior to admission and metoprolol was added to his regimen.  He was started on Eliquis for anticoagulation.  Thyroid function was normal.  He had an echo that admission that revealed LVEF 55-60%.  It was noted that he has pulmonary hypertension from severe COPD.  He was previously treated in [place] and was started on sildenafil.  He has been working with Dr. Leeanne Deed the dose was lowered however, the patient declined further weaning.    Andrew [name] Andrew [name] [name] has been feeling well since his last appointment.  He saw his pulmonologist and was noted to be anemic.  He had positive FOBT testing.  He was referred to Dr. Michail Sermon 2 weeks ago.  He will likely need upper and lower endoscopies.  Andrew [name] Andrew [name] [name] has not experienced any chest pain recently.  His shortness of breath is stable.  He gets short of breath with exertion but feels fine when sitting.  He denies any lower extremity edema, orthopnea, or PND.  He denies any lightheadedness, dizziness, or syncope.  He will be starting physical therapy soon with hopes of getting back into the gym.  Andrew [name] Andrew [name] [name] brings a log of his blood pressures and heart rates today that show his blood pressure is been in the 90s to 110s over 50s to 70s.  His heart rate has ranged from the  50s to the 70s with one episode of 92.   Past Medical History:  Diagnosis Date  . BiPAP (biphasic positive airway pressure) dependence    Pt denies history of OSA  . BPH (benign prostatic hyperplasia)   . Cancer ([place])    skin  . COPD (chronic obstructive pulmonary disease) ([place])   . Dysphagia   . Emphysema lung ([place])   . GERD (gastroesophageal reflux disease)    " Silent reflux"  . Hearing loss    right ear  . HLD (hyperlipidemia)   . Hypertension   . Oxygen dependent    2-3 liters  . Oxygen dependent   . Pneumonia   . Pulmonary hypertension ([place])     Past Surgical History:  Procedure Laterality Date  . CARDIAC CATHETERIZATION    . CATARACT EXTRACTION W/ INTRAOCULAR LENS  IMPLANT, BILATERAL    . ESOPHAGOGASTRODUODENOSCOPY (EGD) WITH PROPOFOL N/A 08/23/2016   Procedure: ESOPHAGOGASTRODUODENOSCOPY (EGD) WITH PROPOFOL;  Surgeon: Wilford Corner, MD;  Location: [facility] ENDOSCOPY;  Service: Endoscopy;  Laterality: N/A;  . LUNG SURGERY    . TONSILLECTOMY       Current Outpatient Medications  Medication Sig Dispense Refill  . ADVAIR DISKUS 250-50 MCG/DOSE AEPB Inhale 1 puff into the lungs 2 (two) times daily. 60 each 2  . albuterol (PROVENTIL HFA;VENTOLIN HFA) 108 (90 Base) MCG/ACT inhaler Inhale 2 puffs into the lungs every 6 (six) hours as needed for  wheezing or shortness of breath. (Patient taking differently: Inhale 2 puffs into the lungs daily. ) 1 Inhaler 11  . apixaban (ELIQUIS) 5 MG TABS tablet Take 1 tablet (5 mg total) by mouth 2 (two) times daily. 60 tablet 10  . benzonatate (TESSALON) 100 MG capsule Take 1 capsule (100 mg total) 3 (three) times daily by mouth. (Patient taking differently: Take 100 mg by mouth 2 (two) times daily. ) 90 capsule 4  . diltiazem (CARDIZEM CD) 180 MG 24 hr capsule Take 1 capsule (180 mg total) by mouth daily. 90 capsule 2  . finasteride (PROSCAR) 5 MG tablet Take 5 mg by mouth at bedtime.     . fluticasone (FLONASE) 50 MCG/ACT nasal spray Place  2 sprays into the nose daily as needed.     Marland Kitchen guaiFENesin (MUCINEX) 600 MG 12 hr tablet Take 1 tablet (600 mg total) by mouth 2 (two) times daily. (Patient taking differently: Take 1,200 mg by mouth daily. ) 30 tablet 0  . hydrocortisone cream 1 % Apply 1 application topically daily as needed (Eczema on face and head).    Marland Kitchen ipratropium-albuterol (DUONEB) 0.5-2.5 (3) MG/3ML SOLN Take 3 mLs by nebulization 3 (three) times daily. 360 mL 2  . metoprolol tartrate (LOPRESSOR) 25 MG tablet Take 25 mg by mouth as directed. 1/2 TABLET TWICE DAILY    . Multiple Vitamins-Minerals (MULTIVITAMIN WITH MINERALS) tablet Take 1 tablet by mouth daily.    Marland Kitchen omeprazole (PRILOSEC) 40 MG capsule Take 40 mg by mouth daily.    . OXYGEN Inhale 2-3 L into the lungs See admin instructions. 2L daytime, 3L at night     . polyethylene glycol (MIRALAX / GLYCOLAX) packet Take 17 g by mouth daily. (Patient taking differently: Take 17 g by mouth at bedtime. Mix in 8 oz liquid and drink) 14 each 1  . predniSONE (DELTASONE) 10 MG tablet Take 1 tablet (10 mg total) by mouth daily. (Patient taking differently: Take 10 mg by mouth daily. 1 5mg   in the morning and 1 5 mg at night) 30 tablet 0  . rosuvastatin (CRESTOR) 10 MG tablet Take 10 mg by mouth daily.    . sildenafil (REVATIO) 20 MG tablet Take 1 tablet (20 mg total) by mouth 2 (two) times daily. 180 tablet 3  . SPIRIVA HANDIHALER 18 MCG inhalation capsule INHALE THE CONTENTS OF 1 CAPSULE EVERY DAY 90 capsule 3  . tamsulosin (FLOMAX) 0.4 MG CAPS capsule Take 1 capsule (0.4 mg total) by mouth daily after breakfast. (Patient taking differently: Take 0.4 mg by mouth at bedtime. ) 30 capsule 0  . temazepam (RESTORIL) 15 MG capsule Take 1 capsule (15 mg total) by mouth at bedtime as needed for sleep (1-2 tabs as needed). 60 capsule 0  . vitamin C (ASCORBIC ACID) 500 MG tablet Take 500 mg by mouth daily.     No current facility-administered medications for this visit.     Allergies:    Patient has no known allergies.    Social History:  The patient  reports that he quit smoking about 33 years ago. His smoking use included cigarettes. He smoked 0.00 packs per day for 0.00 years. He has never used smokeless tobacco. He reports that he does not drink alcohol or use drugs.   Family History:  The patient's family history includes Bone cancer in his brother; Cancer in his maternal uncle; Congestive Heart Failure in his maternal grandfather; Pancreatitis in his mother; Stroke in his maternal grandfather.  ROS:  Please see the history of present illness.   Otherwise, review of systems are positive for none.   All other systems are reviewed and negative.   PHYSICAL EXAM: VS:  BP 110/64   Pulse (!) 53   Ht 5\' 7"  (1.702 m)   Wt 142 lb 9.6 oz (64.7 kg)   BMI 22.33 kg/m  , BMI Body mass index is 22.33 kg/m. GENERAL:  Chronically ill-appearing HEENT:  Pupils equal round and reactive, fundi not visualized, oral mucosa unremarkable NECK:  No jugular venous distention, waveform within normal limits, carotid upstroke brisk and symmetric, no bruits, no thyromegaly LYMPHATICS:  No cervical adenopathy LUNGS:  Clear to auscultation bilaterally.  Diminished air movement.   HEART:  RRR.  PMI not displaced or sustained,S1 and S2 within normal limits, no S3, no S4, no clicks, no rubs, no murmurs ABD:  Flat, positive bowel sounds normal in frequency in pitch, no bruits, no rebound, no guarding, no midline pulsatile mass, no hepatomegaly, no splenomegaly EXT:  2 plus pulses throughout, no edema, no cyanosis no clubbing SKIN:  No rashes no nodules NEURO:  Cranial nerves II through XII grossly intact, motor grossly intact throughout PSYCH:  Cognitively intact, oriented to person place and time   EKG:  EKG is ordered today. The ekg ordered 09/22/17 demonstrates sinus rhythm.  Rate 61 bpm.   03/23/2018: Sinus bradycardia.  Rate 53 bpm.  Echo 09/10/17: Study Conclusions  - Left ventricle: The  cavity size was normal. Wall thickness was normal. Systolic function was normal. The estimated ejection fraction was in the range of 55% to 60%. Wall motion was normal; there were no regional wall motion abnormalities. The study is not technically sufficient to allow evaluation of LV diastolic function. - Pulmonary arteries: PA peak pressure: 33 mm Hg (S). - Pericardium, extracardiac: A trivial pericardial effusion was identified.  Impressions:  - Technically difficult; no apical views; normal LV systolic function; trace MR and mild TR.    Recent Labs: 09/12/2017: Magnesium 2.2; TSH 0.518 03/02/2018: ALT 12; BUN 17; Creatinine, Ser 0.92; Hemoglobin 10.6; Platelets 366.0; Potassium 4.9; Sodium 141    Lipid Panel No results found for: CHOL, TRIG, HDL, CHOLHDL, VLDL, LDLCALC, LDLDIRECT    Wt Readings from Last 3 Encounters:  03/23/18 142 lb 9.6 oz (64.7 kg)  02/10/18 142 lb (64.4 kg)  02/02/18 137 lb (62.1 kg)      ASSESSMENT AND PLAN:  # Paroxysmal atrial fibrillation:  Mr. Mullendore remains in sinus rhythm.  He is bradycardic and hypotensive.  Reduce metoprolol to 12.5mg  bid.  Continue diltiazem and Eliquis.  OK to hold Eliquis 3 days prior to endocsopy.  # Pulmonary hypertension:  Mr. Kilgallon has been on sildenafil since 2007. PASP was only 33 mmHg on echo 09/10/17. This is odd given that he has significant pulmonary disease. Typically this medication would worsen V/Q mismatch in a patient with severe COPD.       Current medicines are reviewed at length with the patient today.  The patient does not have concerns regarding medicines.  The following changes have been made:  no change  Labs/ tests ordered today include:  No orders of the defined types were placed in this encounter.    Disposition:   FU with Dasie Chancellor C. Oval Linsey, MD, [facility] in 4 months.     Signed, Conner Muegge C. Oval Linsey, MD, [facility]  03/23/2018 12:47 PM    [facility]

## 2018-03-23 NOTE — Patient Instructions (Signed)
Medication Instructions:  DECREASE YOUR METOPROLOL TO 25 MG 1/2 TABLET TWICE A DAY   Labwork: NONE  Testing/Procedures: NONE  Follow-Up: Your physician recommends that you schedule a follow-up appointment in: [place]   If you need a refill on your cardiac medications before your next appointment, please call your pharmacy.

## 2018-03-25 ENCOUNTER — Other Ambulatory Visit: Payer: Self-pay | Admitting: Pulmonary Disease

## 2018-03-30 ENCOUNTER — Other Ambulatory Visit: Payer: Self-pay | Admitting: Gastroenterology

## 2018-03-30 DIAGNOSIS — D509 Iron deficiency anemia, unspecified: Secondary | ICD-10-CM | POA: Diagnosis not present

## 2018-04-06 ENCOUNTER — Encounter: Payer: Self-pay | Admitting: Physical Therapy

## 2018-04-06 ENCOUNTER — Other Ambulatory Visit: Payer: Self-pay

## 2018-04-06 ENCOUNTER — Ambulatory Visit: Payer: Medicare Other | Attending: Pulmonary Disease | Admitting: Physical Therapy

## 2018-04-06 VITALS — HR 58

## 2018-04-06 DIAGNOSIS — R2681 Unsteadiness on feet: Secondary | ICD-10-CM | POA: Diagnosis not present

## 2018-04-06 DIAGNOSIS — M6281 Muscle weakness (generalized): Secondary | ICD-10-CM | POA: Diagnosis not present

## 2018-04-06 DIAGNOSIS — R2689 Other abnormalities of gait and mobility: Secondary | ICD-10-CM | POA: Diagnosis not present

## 2018-04-07 NOTE — Therapy (Signed)
[facility] [address] [place], [place], [address] Phone: [phone]   Fax:  [phone]  Physical Therapy Evaluation  Patient Details  Name: Andrew [name] MRN: [id number] Date of Birth: Dec 24, 1940 Referring Provider: Dr. Teressa Lower    Encounter Date: 04/06/2018  PT End of Session - 04/07/18 2256    Visit Number  1    Number of Visits  9    Date for PT Re-Evaluation  05/07/18    Authorization Type  Medicare    Authorization Time Period  04-06-18 - 07-05-18    PT Start Time  1146    PT Stop Time  1232    PT Time Calculation (min)  46 min       Past Medical History:  Diagnosis Date  . BiPAP (biphasic positive airway pressure) dependence    Pt denies history of OSA  . BPH (benign prostatic hyperplasia)   . Cancer ([place])    skin  . COPD (chronic obstructive pulmonary disease) ([place])   . Dysphagia   . Emphysema lung ([place])   . GERD (gastroesophageal reflux disease)    " Silent reflux"  . Hearing loss    right ear  . HLD (hyperlipidemia)   . Hypertension   . Oxygen dependent    2-3 liters  . Oxygen dependent   . Pneumonia   . Pulmonary hypertension ([place])     Past Surgical History:  Procedure Laterality Date  . CARDIAC CATHETERIZATION    . CATARACT EXTRACTION W/ INTRAOCULAR LENS  IMPLANT, BILATERAL    . ESOPHAGOGASTRODUODENOSCOPY (EGD) WITH PROPOFOL N/A 08/23/2016   Procedure: ESOPHAGOGASTRODUODENOSCOPY (EGD) WITH PROPOFOL;  Surgeon: Wilford Corner, MD;  Location: [facility] ENDOSCOPY;  Service: Endoscopy;  Laterality: N/A;  . LUNG SURGERY    . TONSILLECTOMY      Vitals:   04/06/18 1218  Pulse: (!) 58  SpO2: 92%     Subjective Assessment - 04/07/18 1611    Subjective  Pt reports he is only able to ambulate short distance before he gets very fatigued and short of breath    Pertinent History  Chronic hypoxemic respiratory failure, pulmonary HTN, centrilobular emphysema, paroxysmal atrial fibrillation:   COPD    Patient Stated Goals  improve strength and endurance to be able to participate in pulmonary rehab and then to return to [facility] for exercise    Currently in Pain?  No/denies         [facility] PT Assessment - 04/07/18 1617      Assessment   Medical Diagnosis  Centrilobular emphysema:  Chronic hypoxemic respiratory failure; COPD    Referring Provider  Dr. Teressa Lower     Onset Date/Surgical Date  -- Jan. 2019    Prior Therapy  Pt had Pulmonary rehab in Oct. 2017      Precautions   Precautions  Other (comment) Dyspnea with activity - on  oxygen       Balance Screen   Has the patient fallen in the past 6 months  No    Has the patient had a decrease in activity level because of a fear of falling?   No    Is the patient reluctant to leave their home because of a fear of falling?   No      Home Environment   Living Environment  Private residence    Type of [place] Access  Level entry    [place]  One level  Prior Function   Level of Independence  Independent    Vocation  Retired    Leisure  goes out to lunch almost every day; pt was going to [employer] 5-6x/week in Jan. prior to exacerbation       ROM / Strength   AROM / PROM / Strength  Strength      Strength   Overall Strength Comments  bil. knee and hip musc. grossly 4/5       Transfers   Five time sit to stand comments   10.13  no UE support from mat      Ambulation/Gait   Ambulation/Gait  Yes    Ambulation/Gait Assistance  5: Supervision    Ambulation Distance (Feet)  400 Feet    Assistive device  None    Gait Pattern  Step-through pattern    Ambulation Surface  Level;Indoor    Gait velocity  12.22 = 2.68 ft/sec  pt on 2L oxygen    Stairs  Yes    Stair Management Technique  Two rails;Alternating pattern;Forwards    Number of Stairs  12    Height of Stairs  6    Gait Comments  Pt amb. 2" 45 secs nonstop - RPE reported as 5-6/10 after this ambulation ;  O2 84%  HR 73 bpm      Standardized Balance  Assessment   Standardized Balance Assessment  Berg Balance Test;Timed Up and Go Test      Timed Up and Go Test   Normal TUG (seconds)  11.25 2L O2                Objective measurements completed on examination: See above findings.                   PT Long Term Goals - 04/07/18 2311      PT LONG TERM GOAL #1   Title  Pt will amb. 4" nonstop to demo improved activity tolerance.      Baseline  2" 45 secs nonstop     Time  4    Period  Weeks    Status  New    Target Date  05/07/18      PT LONG TERM GOAL #2   Title  Pt will perform 5 times sit to stand in </= 8 secs to demo increased strength and endurance.    Baseline  10.13 secs    Time  4    Period  Weeks    Status  New    Target Date  05/07/18      PT LONG TERM GOAL #3   Title  Pt will negotiate 12 step with 1 rail using a step over step sequence with PRE rating </= 4/10 intensity.    Time  4    Period  Weeks    Status  New    Target Date  05/07/18      PT LONG TERM GOAL #4   Title  Independent in HEP for strengthening and balance exercises.     Time  4    Period  Weeks    Status  New    Target Date  05/07/18      PT LONG TERM GOAL #5   Title  Pt will be able to participate in pulmonary rehab upon D/C from PT.    Time  4    Period  Weeks    Status  New    Target Date  05/07/18  Plan - 04/07/18 2257    Clinical Impression Statement  Pt presents with dyspnea and fatigue with activity due to emphysema and COPD; pt is on 2L oxygen but had decrease in O2 saturation rates after amb. 2" 45 secs without requiring rest period.  Strength in bil. LE's WFL's but pt has severe SOB with activity and requires frequent seated rest periods.      History and Personal Factors relevant to plan of care:  COPD, emphysema; pt did pulmonary rehab in Oct. 2017 and wishes to be able to return to that program in hopes of returning to [facility]    Clinical Presentation  Stable    Clinical Presentation  due to:  COPD    Clinical Decision Making  Low    Rehab Potential  Good    PT Frequency  2x / week    PT Duration  4 weeks    PT Treatment/Interventions  ADLs/Self Care Home Management;Therapeutic exercise;Therapeutic activities;Gait training;Stair training;Balance training;Neuromuscular re-education;Patient/family education;Energy conservation    PT Next Visit Plan  MONITOR O2 saturation rate -- endurance activities, functional strengthening - sit to stand, step training, ambulation HEP - balance, strengthening, walking    PT Home Exercise Plan  see above    Recommended Other Services  Pulmonary rehab after D/C from PT    Consulted and Agree with Plan of Care  Patient       Patient will benefit from skilled therapeutic intervention in order to improve the following deficits and impairments:  Difficulty walking, Decreased balance, Decreased activity tolerance, Cardiopulmonary status limiting activity, Decreased endurance  Visit Diagnosis: Muscle weakness (generalized) - Plan: PT plan of care cert/re-cert  Other abnormalities of gait and mobility - Plan: PT plan of care cert/re-cert  Unsteadiness on feet - Plan: PT plan of care cert/re-cert     Problem List Patient Active Problem List   Diagnosis Date Noted  . PVCs (premature ventricular contractions) 02/02/2018  . Pressure injury of skin 01/18/2018  . Influenza-like illness 01/17/2018  . History of pneumonia 12/16/2017  . OSA on CPAP 09/22/2017  . OSA and COPD overlap syndrome ([place]) 09/11/2017  . Paroxysmal atrial fibrillation ([facility])   . Malnutrition of moderate degree 09/10/2017  . Sepsis ([place]) 09/09/2017  . Diarrhea 09/09/2017  . Protein-calorie malnutrition, severe 12/09/2016  . Dysphagia 08/23/2016  . Normocytic anemia 06/28/2016  . Pressure sore on buttocks 06/28/2016  . Pulmonary hypertension ([place]) 02/20/2016  . GERD (gastroesophageal reflux disease) 02/20/2016  . COPD with acute exacerbation ([place]) 02/13/2016  .  Acute respiratory failure with hypoxia ([place]) 02/12/2016  . Hypertension 02/11/2016  . Hyperlipidemia 02/11/2016  . CAP (community acquired pneumonia)   . BPH (benign prostatic hyperplasia) 12/29/2015  . Chronic respiratory failure with hypoxia ([place]) 12/28/2015  . COPD with emphysema ([place]) 10/02/2015  . Chronic hypoxemic respiratory failure ([place]) 10/02/2015  . History of pulmonary hypertension 10/02/2015    Alda Lea, PT 04/07/2018, 11:20 PM  [facility] [address] [place], [place], [address] Phone: [phone]   Fax:  [phone]  Name: Keylan Costabile MRN: [id number] Date of Birth: 1941-08-21

## 2018-04-14 ENCOUNTER — Other Ambulatory Visit: Payer: Self-pay | Admitting: Gastroenterology

## 2018-04-14 ENCOUNTER — Ambulatory Visit (INDEPENDENT_AMBULATORY_CARE_PROVIDER_SITE_OTHER): Payer: Medicare Other | Admitting: Pulmonary Disease

## 2018-04-14 ENCOUNTER — Encounter: Payer: Self-pay | Admitting: Pulmonary Disease

## 2018-04-14 VITALS — BP 118/62 | HR 63 | Temp 98.2°F | Ht 67.0 in | Wt 142.8 lb

## 2018-04-14 DIAGNOSIS — I480 Paroxysmal atrial fibrillation: Secondary | ICD-10-CM | POA: Diagnosis not present

## 2018-04-14 DIAGNOSIS — I493 Ventricular premature depolarization: Secondary | ICD-10-CM

## 2018-04-14 DIAGNOSIS — D649 Anemia, unspecified: Secondary | ICD-10-CM | POA: Diagnosis not present

## 2018-04-14 DIAGNOSIS — Z9989 Dependence on other enabling machines and devices: Secondary | ICD-10-CM

## 2018-04-14 DIAGNOSIS — J9611 Chronic respiratory failure with hypoxia: Secondary | ICD-10-CM

## 2018-04-14 DIAGNOSIS — Z8679 Personal history of other diseases of the circulatory system: Secondary | ICD-10-CM | POA: Diagnosis not present

## 2018-04-14 DIAGNOSIS — G4733 Obstructive sleep apnea (adult) (pediatric): Secondary | ICD-10-CM | POA: Diagnosis not present

## 2018-04-14 DIAGNOSIS — Z8701 Personal history of pneumonia (recurrent): Secondary | ICD-10-CM

## 2018-04-14 DIAGNOSIS — J432 Centrilobular emphysema: Secondary | ICD-10-CM

## 2018-04-14 DIAGNOSIS — I10 Essential (primary) hypertension: Secondary | ICD-10-CM

## 2018-04-14 MED ORDER — PREDNISONE 10 MG PO TABS: 10.0000 mg | ORAL_TABLET | Freq: Every day | ORAL | 0 refills | Status: DC

## 2018-04-14 MED ORDER — BENZONATATE 100 MG PO CAPS: 100.0000 mg | ORAL_CAPSULE | Freq: Three times a day (TID) | ORAL | 0 refills | Status: DC | PRN

## 2018-04-14 NOTE — Patient Instructions (Signed)
Today we updated your med list in our EPIC system...    Continue your current medications the same...  Good luck w/ restarting the physical therapy/ pulm rehab program...  Call for any questions...  Let's plan a follow up visit in 31mo & we will recheck your labs at that time.Marland KitchenMarland Kitchen

## 2018-04-14 NOTE — Progress Notes (Signed)
Subjective:     Patient ID: Andrew [name], male   DOB: 1941/08/10, 77 y.o.   MRN: [id number]  HPI 77 y/o WM, an ex-smoker quit in 1986, w/ severe bullous emphysema & hx of RUL bullous resection in 2007; Hx both ?hypercarbic & +hypoxemic resp failure w/ cor pulmonale & secondary pulm HTN on Revatio x yrs; He has been stable for >10 yrs on the same pulm regimen as he has travelled around the [place] living in [place], [place], and [place]...  ~  October 02, 2015:  Initial pulmonary evaluation by SN>  His PCP is Dr. Lujean Amel, [name]...       Poss is a 77 y/o gentleman from [place]- moved here to [place] ~[place]moago to live w/ his daughter & son-in-law;  He has a long convoluted history & we have none of his prev objective data to review;  He tells me that he has known about COPD/Emphysema for >10 yrs and in 2007 he had right thoracotomy & "bleb-ectomy";  After this procedure he was placed on Oxygen at 2L/min and BiPAP to use at night, along w/ ADVAIR250Bid & SPIRIVA daily, plus REVATIO20Tid for pulmonary HTN;  He was also treated w/ NEBS for about 1-245yrthen this was discontinued;  He has pretty much been on this same regimen for the past 9 yrs w/o much change, despite or maybe because he has moved around a lot- [name], [name]...  He describes himself as being rock-solid stable on this exact regimen since 2007- he had Cath (?left & right heart) in 2007, told 1 blockage, good LVF ?right heart results, started on O2, BiPAP, and Revatio but he does not know why?  He notes min cough when supine & in early AM attributed to reflux; min if any sput production, no hemoptysis, he denies SOB but states DOE "if I over-exert" eg- walking, lifting/carrying, stairs, etc; he notes that ADLs are ok- no problem (he is stoic);  He denies CP, palpit, f/c/s, edema... He hasn't been  to an ER since 2007 he says & that was also the last time he had any Pred; he thinks he had CXR, PFT, 2DEcho all earlier this yr...   Smoking Hx>  He is an ex-smoker, started in his teens, smoked for 30 yrs up to 1ppd, quit in 1986; This is a 30 pack-yr hx, he does not recall ever being checked for A1AT defic...  Pulmonary Hx>  COPD/ Emphysema w/ right upper lobe "bleb[place]) [place] hypoxemic resp failure on O2 at 2L/min since 2007;  He tells me that he was started on BiPAP about that same time but he doesn't know why- never had sleep study, not on CPAP prev, he does not know about pCO2 levels etc ("I like the fresh cool air");  [name] he does not know the settings, machine never downloaded, etc;  He has also been on Revatio20Tid since 2007, apparently never tried on other meds, dose never adjusted- he knows about "pulmonary hypertension" but he doesn't know any details and it doesn't appear to have been followed up, and meds kept the same from doctor to doctor...   Medical Hx>  HBP, ?nonobstructive CAD, HL, thyroid nodule, GERD, constipation, BPH, insomnia...  Family Hx>  Father died w/ Emphysema &  was a former smoker; no other hx lung dis in the family; Alpha-1 status is unknown...  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   Current Meds>  Oxygen 2L/min pulse-dose concentrator, Advair250Bid, Spiriva daily, Revatio20Tid, CardizemCD240, Crestor10, Nexium20, Proscar5, Restoril30...  EXAM shows Afeb, VSS, O2sat=93% on 2L/min pulse-dose;  Heent- neg, mallampati1;  Chest- decr BS at bases, can't augment BS voluntarily, w/o w/r/r;  Heart- RR w/o m/r/g;  Abd- soft, neg;  Ext- neg w/o c/c/e;  Neuro- intact...  CXR 10/02/15 showed norm heart size,  COPD, bullous emphysema/ hyperinflation, scarring right apex, NAD...   Spirometry 10/02/15 showed FVC=2.70 (69%), FEV1=1.12 (37%), %1sec=41, mid-flows reduced at 18% predicted; this is c/w severe airflow obstruction & GOLD Stage 3 COPD  Ambulatory oxygen saturation test 10/02/15> on O2 at 2L/min pulse-dose concentrator: O2sat=96% on 2L at rest w/ pulse=87; he walked 2 laps w/ his O2, stopped due to dyspnea, lowest O2sat=89% w/ pulse=113/min...  LAB 10/02/15>  Alpha-1-Antitrypsin level => pending (he never went to the lab for this blood test)  2DEchocardiogram 10/09/15 showed norm LVF w/ EF=55-60%, norm wall motion, mild MR, mild RA dil, PAsys est 66mHg... Pt on Revatio 20Tid x 934yr& I rec we wean slowly (Decr to Bid now)...    IMP >>     COPD/ bullous emphysema> s/p RUL "bleb-ectomy" in 2007, severe airflow obstruction w/ GOLD Stage3 COPD> on Advair250Bid & Spiriva daily; apparently he has no use for a rescue inhaler; prev on NEB w/ Albut but not for several yrs.     Chronic hypoxemic respiratory failure on O2 at 2L/min via pulse-dose concentrator...    Pt reports using BiPAP since 2007, never been on CPAP, never had sleep study he says, unknown ABGs or pCO2 data; machine from [name] will try to get the old data => none received.    Hx pulmonary hypertension on Revatio20Tid since 2007 w/o additional med trials or dose adjustments> we do not have any of the objective data from his prev physician teams... Current 2DEcho w/ PAsys est 3626m & we will wean the Revatio to Bid at this point => he does not want to wean further for "other" reasons...    Medical issues include:  HBP, ?nonobstructive CAD, HL, thyroid nodule, GERD, BPH, insomnia... PLAN >>     MarOmids a distinctly patchy history to go along w/ his severe airflow obstruction & bullous emphysema;  We really need his old objective data from 2007 when he was started on O2 & BiPAP after his RUL bleb reduction surg;  He will try to get  names and numbers for us-[place] the meanwhile we will [place] [facility] [place] [place] more recent data as we establish out [place] that he wants us [place] continue his current regimen which has served him well over the last 70yr270yrContinue Advair250Bid, Spiriva daily, Revatio20Tid=>Bid, O2 at 2L/min, and the BiPAP nightly as currently set... We plan ROV recheck in 1 month... NOTE> 2DEcho w/ PAsys est ~36mm40m we will slowly wean Revatio...  ~  November 01, 2015:  20mo R64mo MrBowman reports stable, doing satis & notes no untoward effects from cutting the Revatio to 20mgBi49me denies CP, palpit, incr SOB, edema, etc; he  continues on the O2 at 2L/min, BiPAP from [place] once daily; we have not received any records from his mult physicians ([place], [place], [place])...    EXAM shows Afeb, VSS, O2sat=92% on 2L/min pulse-dose;  Heent- neg, mallampati1;  Chest- decr BS at bases, can't augment BS voluntarily, w/o w/r/r;  Heart- RR w/o m/r/g;  Abd- soft, neg;  Ext- neg w/o c/c/e;  Neuro- intact... IMP/PLAN>>  See prob list above- he is stable on this regimen but does not want to wean the Revatio further for "other" reasons; OK to continue current med regimen- advised regular exercise vs pulm rehab program; given ZPak for prn use over the winter & he knows to call for any resp issues, incr dyspnea, etc... He remains on BiPAP but we do not have any data- no records received from prev pulm physicians, no notes from [place], no download data from his machine- we will again try to make contact w/ his DME company... We plan ROV in 3-67mo  ~  Mar 18, 2016:  4-542moOV & post hosp visit>  MaYeseniaas severe COPD/Emphysema, GOLD Stage 3 w/ FEV1=1.12L (37%predicted) [id number] Hx right thoracotomy & "bleb-ectomy" in 2007;  after this procedure he says he was placed on Oxygen at 2L/min and BiPAP to use at night, along w/ ADVAIR250Bid, SPIRIVA  daily, and REVATIO20Tid for pulmonary HTN (2DEcho here 11/16 showed PAsys est=3643m)-- we do not have any of that data from [place] have been unsuccessful in obtaining old data from any of his prev physicians); he has been resistent to any adjustment in his medication regimen for various reasons...     He's been [facility]> [facility] by [facility] w/ CAP- CXR showed severe bullous emphysema, incr markings in RLL and atelectasis in R-mid lung, Temp 103, Lactate=2.4, WBC 16K; treated w/ O2, Solumedrol=>Pred, Zosyn/Vanco=>Levaquin, NEBs, etc; disch home w/ home health help- ?seen by his PCP after disch...    [facility] w/ 1d hx incr SOB, wheezing, productive cough & felt to have a COPD exac; CXR showed his COPE/E, persistent incr markings in right base, WBC was elev at 20K, BNP=60, no pos cultures; he was treaed w/ O2, Solumedrol, Roceph/Zithro, NEBs, etc; he was disch on Levaquin & Pred; he developed urinary retention & foley placed (weaned off in NH); he was debilitated & sent to NH for rehab...     [facility] & attended by PieSunGardte dated 03/01/16 reviewed-- finished Levaquin, weaned off the Pred, they were able to discontinue the foley & he passed voiding trial; disch home after 19d in rehab...     Now back home on same O2= 2L/min days & 3L/min night w/ BiPAP ?settings (he has been on this for yrs and never re-assessed), NEB w/ AlbutTid, Advair250Bid, Spiriva daily, Revatio20Bid (pt refuses to taper this med further);  He has developed pedal edema x3d & needs a low sodium diet + diuretic started but he tells me he is sched to see his PCP soon for this problem...    EXAM shows Afeb, VSS, O2sat=95% on 2L/min pulse-dose;  Heent- neg, mallampati1;  Chest- decr BS at bases, can't augment BS voluntarily, w/o w/r/r;  Heart- RR w/o m/r/g;  Abd- soft, neg;  Ext- neg w/o c/c/e;  Neuro- intact...  CXR 02/11/16 showed  hyperinflation, bullous emphysema, some scarring in right mid lung & both lower lobes, no  infiltrate, no edema...  LABS 01/2016> all reviewed in Epic... IMP/PLAN>>  Andrew [name] has had a protracted resp exac- triggered by prob RLL pneumonia (NOS) superimposed on his severe COPD/bullous emphysema;  Rec to change the Albut for Neb to [place] & continue treatments Tid; continue other meds regularly as outlined;  He is referred to [facility] & hopes to start soon;  We will plan ROV in 38mosooner if needed.  ~  June 20, 2016:  365moOV w/ SN>  Andrew [name] returns for a 46m47moV & states that he is doing very well- no new complaints or concerns at this time, his PCP is DrKoirala; [facility] & continues in this program at present; Pt prev [facility] about treatments at [facility] [place] regenerative [facility] tissue (stem cell therapy) which costs ~$10K per treatment & all benefits are antecdotal; I [facility] if he wants cutting edge research approach...     EPIC records indicate ER visit 05/08/16 for Abd Pain> VSS, exam was neg, CT Abd showed large fecal burden otherw neg, distended urinary bladder, atherosclerosis, bilat L5 pars defects; Rec to take laxatives...     Severe COPD- GOLD Stage3, bullous emphysema, s/p resection of RUL bleb in 2007> on Advair250Bid & Spiriva daily; he has NEB w/ Duoneb for prn use; Spirometry 09/2015 w/ FEV1=1.12 (37%); he is enrolled in PulOhiowants a POC instead of tanks;     Chronic hypoxemic respiratory failure on O2 at 2L/min via POC> ambulatory O2sats 09/2015 dropped to 89% on 2L/min after 2 Laps.    Hx prob hypercarbic resp failure inferred from Pt report of BiPAP ([facility]) since 2007, never been on CPAP, never had sleep study, unknown ABGs or pCO2 data>    Hx cor pulmonale/ pulmonary hypertension on Revatio20Tid since 2007 w/o additional med trials or dose adjustments> we do not have any of the objective data from his prev  physician teams; 2DEcho here 09/2015 showed PAsys=36 and we tried to wean his Revatio but he refused due to "other reasons", finally compromised on Revatio20Bid...    Hx CAP- Hosp 12/2015 w/ RLL opac (nos) & responded to broad spectrum Ab coverage + Sulomedrol, O2, Nebs, etc; readmitted 01/2016 w/ COPD exac- similar Rx but sent to NH for rehab at disch...    Cardiac issues>  ?nonobstructive CAD, cor pulmonale w/ mild pulmHTN & 2DEcho 09/2015 showing norm LVF w/ EF=55-60%, norm wall motion, mild MR, mild RA dil, PAsys est 75m11m..    Medical issues include:  HBP (on CardizemCD240), HL (on Cres10), hx thyroid nodule, GERD (on Nexium40), Constipation, BPH (on Proscar5 & Flomax0.4) w/ hx urinary retention, insomnia (on Restoril30)... EXAM shows Afeb, VSS, O2sat=93% on 2L/min pulse-dose;  Heent- neg, mallampati1;  Chest- decr BS at bases, can't augment BS voluntarily, w/o w/r/r;  Heart- RR w/o m/r/g;  Abd- soft, neg;  Ext- neg w/o c/c/e;  Neuro- intact...  CXR 05/08/16>  Mod bullous emphysema w/ chr changes at the lung bases w/ pleuroparenchymal scarring, surg suture lines ove the right mid lung...  LABS 04/2016 in epic> Chems- ok, BS=149;  CBC- anemia w/ Hg= 10.4-11.7, WBC=15K IMP/PLAN>>  Andrew [name] is stable on his baseline regimen + pulm rehab, etc; rec to continue current meds and exercise; he needs the 2017 Flu vaccine when avail & will call prn any breathing problems; we plan ROV in 5mo.145mo ADDENDUM>>  Pt Hosp by [facility] 8/11 - 07/01/16 w/  CAP after presenting w/ incr SOB, decr O2sats at pulm rehab, & chills 9he lives w/ school aged grandkids)- CXR showed ?RLL opac, cultures neg & NOS- treated empirically w/ Rocephin & Zithromax, Solumedrol, O2, NEBS, etc; he improved gradually & disch home;  He went to see ENT 8/29 because he thought the pneumonia may have been from reflux & dysphagia- fiberoptic exam revealed some edema & pooling of secretions c/w LPR=> placed on PPI Bid and referred to GI;  Barium esophagram  showed a [place] & mod GERD but no esophagitis mass or stricture;  He saw DrSchooler & had EGD 08/23/16 showing a Zebulon, patchy candida=> treated w/ Nystatin, later required Diflucan...   ADDENDUM>>  Pt had colonoscopy 08/23/16 by DrSchooler-- small HH, patchy candida, mild gastitis seen;  Treated w/ Nystatin, continue Prilosec40/d...   ~  November 25, 2016:  78moROV w/ SN>  Andrew [name] returns c/o 3d hx cough, lots of clear whitish sput, increased weakness and SOB "even on my oxygen"; symptoms started w/ a slight sore throat, hoarse, cough as noted but no f/c/s; he has been going to the Gym 5d per week to continue his exercise since graduating from the formal pulm rehab program (finished 07/2016) & is doing very well by his estimate (until this URI); he notes that 3d ago he had his usual 2H work out, went to tWellsite geologist then went to lunch & did all this w/o difficulty...      Severe COPD- GOLD Stage3, bullous emphysema, s/p resection of RUL bleb in 2007> on Advair250Bid & Spiriva daily; he has NEB w/ Duoneb for prn use; Spirometry 09/2015 w/ FEV1=1.12 (37%); he finished his PulmRehab 07/2016 & now exercises 5d/wk at the gym; he finally got his POC that he wanted...    Chronic hypoxemic respiratory failure on O2 at 2L/min via POC> ambulatory O2sats 09/2015 dropped to 89% on 2L/min after 2 Laps; repeat 11/2016 dropped to 85%after 1Lap...    Hx prob hypercarbic resp failure inferred from Pt report of BiPAP ([facility]) since 2007, never been on CPAP, never had sleep study, unknown ABGs or pCO2 data> we never received records from former physician teams or any of his old records...     Hx cor pulmonale/ pulmonary hypertension on Revatio20Tid since 2007 w/o additional med trials or dose adjustments> we do not have any of the objective data from his prev physician teams; 2DEcho here 09/2015 showed PAsys=36 and we tried to wean his Revatio but he refused due to "other reasons", finally compromised on Revatio20Bid...    Hx CAP-  Hosp 12/2015 w/ RLL opac (nos) & responded to broad spectrum Ab coverage + Solumedrol, O2, Nebs, etc; readmitted 01/2016 w/ COPD exac- similar Rx but sent to NH for rehab at disch...    Cardiac issues>  ?nonobstructive CAD, cor pulmonale w/ mild pulmHTN & 2DEcho 09/2015 showing norm LVF w/ EF=55-60%, norm wall motion, mild MR, mild RA dil, PAsys est 372mg...    Medical issues include:  HBP (on CardizemCD240), HL (on Cres10), hx thyroid nodule, GERD & prob LPR (on Nexium40), Constipation, BPH (on Proscar5 & Flomax0.4) w/ hx urinary retention, insomnia (on Restoril30)...       NOTE> pt is Iron deficient (see below) & needs to f/u w/ his PCP & GI for this...  EXAM shows Afeb, VSS, O2sat=92% on 3L/min;  Heent- neg, mallampati1;  Chest- decr BS at bases, can't augment BS voluntarily, w/o w/r/r;  Heart- RR w/o m/r/g;  Abd- soft, neg;  Ext- neg w/o  c/c/e;  Neuro- intact...  CXR 06/28/16>  Hyperinflation, marked emphysematous changes, crowed lung markings at the bases, no consolidation or effusions...  CXR 11/25/16>  COPD, bullous emphysema, and bilat pleuroparenchymal changes c/w scarring, no acute abnormality  Ambulatory O2sat on 3L/min pulse-dose POC>  O2sat=98% on 3L/min pulse-dose at rest w/ HR=90/min;  He ambulated only 1Lap (185') w/ O2sat drop to 85% w/ HR=108/min...  LABS 11/25/16>  Chems- wnl x BS=108;  A1c=6.1;  CBC- Hg=12.3, mcv=90, WBC=7.8K;  Fe=31 (6.6%sat) & Ferritin=8.7;  B12=661 IMP/PLAN>>  There is no such thing as a mild exac for MrBowman- even the mildest of URIs can cause a severe COPD exac- we discussed Rx w/ Levaquin500 x7d, Pred80m- 5d taper (see AVS), + Diflucan100 per his request; hopefully he will respond but he has little in the way of reversible factors given his severe emphysema; [facility] symptoms worsen despite therapy; as noted he is Fe defic & needs further eval per his PCP & GI- copies sent...  ~  December 23, 2016:  163moOV & post hospital  check>  [name] by TrDiona Browner/ a COPD exac w/ acute on chronic resp failure;  Pt already on Home O2 at 2L/min and BiPAP Qhs; he had a root canal done, developed a sore throat, then cough, etc; Levaquin & Pred called -in for pt, Hosp for 4d w/ IV solumedrol & ultimately disch on Pred10... Currently c/o DOE w/ ADLs which is new to him as prev he was going to the gym etc...     He is currently taking Duoneb Q6H as needed + Advair250Bid, Spiriva via handihaler daily, Pred 1060mabs- slow taper... EXAM shows Afeb, VSS, O2sat=94% on 2L/min;  Heent- neg, mallampati1;  Chest- decr BS at bases, can't augment BS voluntarily, w/o w/r/r;  Heart- RR w/o m/r/g;  Abd- soft, neg;  Ext- neg w/o c/c/e;  Neuro- intact...  CXR 12/07/16 (independently reviewed by me in the PACS system)> norm heart size, calcif Ao, severe COPD w/ apical bullous changes & surg staples on right, NAD... Marland KitchenMarland KitchenKG 12/07/16 showed NSR, rate 87, poor R prog V1-2, otherw WNL... Marland KitchenMarland KitchenABS 11/2016> NOT retaining CO2 (ABG w/ pCO2=39;  Chems- ok;  CBC- ok w/ Hg=11-12 and wbc=21=>12;  BNP= wnl... IMP/PLAN>>  Andrew [name] had a COPD exac & required IV Solumedrol + in hosp attention w/ NEBS etc & now he is approaching his baseline; he called for an order for a Hosp bed but was referred to his PCP for this determination;  We reviewed the NEED for DUONEB Tid followed by Advair250Bid & Spiriva once daily; we will wean his Pred from 72m79mto 5mg/5m plan ROV receheck in 6wks...  NOTE: >50% of this 25min73m was spent in counseling & coordination of care...  ~  January 28, 2017:  6wk ROV & Yadkinable on meds + Pred 5mg/d;38me called 01/15/17 for antibiotics for an infected tooth- called in Augmentin & tooth has been extracted by dentist;  He remains on NEBs w/ Duoneb Tid, followed by Advair250Bid and Spiriva daily;  He reports using his Revatio "prn";  His breathing continues to improve, back in the gym 3d/wk, approaching his baseline he says;  Denies  cough, sput, hemoptysis, CP, etc... We reviewed his prob list as above...  EXAM shows Afeb, VSS, O2sat=96% on 2L/min;  Heent- neg, mallampati1;  Chest- decr BS at bases, can't augment BS voluntarily, w/o  w/r/r;  Heart- RR w/o m/r/g;  Abd- soft, neg;  Ext- neg w/o c/c/e;  Neuro- intact... IMP/PLAN>>  Andrew [name] continues to improve & move toward his baseline; Rec to continue Pred27m/d thru March then decr to 549mQod til return visit in 41m18mo  ~  May 01, 2017:  448mo7448mo & Andrew [name] reports a good 448mo 57morval w/o acute exac and w/o new complaints or concerns; he is back in the gym 3d/wk & back to baseline he says;  As prev noted Andrew [name] is on BiPAP & has been for yrs- [place] & I presume this was done for COPD & CO2 retention ant NOT because of sleep apnea; he does not know his settings 7 we have been unable to get old records indicating his initial parameters or follow up monitoring; cureent problem is that his machine is old, mask is 53yr o12yrno tubing supplies in a long time;  For his part he says he does NOT rest well, just in short intervals;  He goes to bed at 11PM using his BiPAP; wakes at 3-4AM & can't get back to sleep; maybe eats breakfast at 4AM and drifts back to sleep (w/o BiPAP) until 7AM or so; similarly he might nap for 1H during the day (w/o BiPAP); he currently thinks that the BiPAP makes no difference in his sleep & it would just as well suit him to stop it if able...  WE DISCUSSED REASSESSMENT w/ ABG on RA, and a HOME SLEEP TEST on RA to evaluate for sleep apnea and hypercarbic/ hypoxemic resp failure...     [facility] does his Home O2 (2L/min continuous) and [facility] does his nursing care    Andrew [name] also tells me he had a suspicious mole removed from his left post shoulder ~48mo ag69mox= melanoma, then wider excision by DERM[place] Severe COPD- GOLD Stage3, bullous emphysema, s/p resection of RUL bleb in 2007> on Advair250Bid & Spiriva  daily; he has NEB w/ Duoneb for prn use; he is off PRED now; Spirometry 09/2015 w/ FEV1=1.12 (37%); he finished his PulmRehab 07/2016 & now exercises regularly at the gym...    Chronic hypoxemic respiratory failure on O2 at 2L/min via POC> ambulatory O2sats 09/2015 dropped to 89% on 2L/min after 2 Laps; repeat 11/2016 dropped to 85% after 1Lap...    Hx prob hypercarbic resp failure inferred from Pt report of BiPAP ([facility]) since 2007, never been on CPAP, never had sleep study> we never received records from former physician teams or any of his old records; ABGs in EPIC> [facility] pH=7.44/ pCO2=35/ pO2=69 ?on RA; 11/2016 pH=7.44/ pCO2=39/ pO2=146 on 4Lnc;   => he has agreed to re-assessment here 2018 w/ ABG on RA and Home Sleep Study => pending...    Hx cor pulmonale/ pulmonary hypertension on Revatio20Tid since 2007 w/o additional med trials or dose adjustments> we do not have any of the objective data from his prev physician teams; 2DEcho here 09/2015 showed PAsys=36 and we tried to wean his Revatio but he refused due to "other reasons", finally compromised on Revatio20Bid...    Hx CAP- Hosp 12/2015 w/ RLL opac (nos) & responded to broad spectrum Ab coverage + Solumedrol, O2, Nebs, etc; readmitted 01/2016 w/ COPD exac- similar Rx but sent to NH for rehab at disch...    Cardiac issues> no cardiologist> ?nonobstructive CAD, cor pulmonale w/ mild pulmHTN & 2DEcho 09/2015 showing norm LVF w/ EF=55-60%, norm  wall motion, mild MR, mild RA dil, PAsys est 44mHg...    Medical issues> PCP= Dr. DLauretta GrillKoirala ([facility])>  HBP (on CardizemCD240), HL (on Cres10), hx thyroid nodule, GERD & prob LPR (GI= DrSchooler, on Prilosec40), Constipation, BPH (on Proscar5 & Flomax0.4) w/ hx urinary retention, insomnia (on Restoril30)...       NOTE> pt is Iron deficient & needs to f/u w/ his PCP & GI for this>> LABS 11/25/16>  Chems- wnl x BS=108;  A1c=6.1;  CBC- Hg=12.3, mcv=90, WBC=7.8K;  Fe=31 (6.6%sat) & Ferritin=8.7;   B12=661 EXAM shows Afeb, VSS, O2sat=90% on 2L/min pulse dose;  Heent- neg, mallampati1;  Chest- decr BS at bases, can't augment BS voluntarily, w/o w/r/r;  Heart- RR w/o m/r/g;  Abd- soft, neg;  Ext- neg w/o c/c/e;  Neuro- intact...  ABG on RA> pending (not done)  Home Sleep Test> pending (see below) IMP/PLAN>>  Andrew [name] has agreed to proceed w/ home sleep test to r/o OSA and we will further assess w/ ABG on RA to check for CO2 retention and any indication for BiPAP (doubt he will qualify at this time);  He will continue w/ his O2, Advair250, Spiriva, Duoneb vs ProventilHFA which he uses prn;  He does not appear to need the Revatio either but declines to discontinue this medication for other reasons.  ADDENDUM>>  Home Sleep Test done 05/20/17>  4H study showed mild OSA w/ AHI=6.0/hr;  Study done on RA-- lowest O2sat was 74% w/ an ave of 87%;  Pt given the option for in-lab CPAP titration study vs trial CPAP w/ autoset & a mask fit session w/ VLynnae Sandhoff  He prefers the latter & states he doesn't believe that he has OSA => we will arrange a mask fit session, the order a ResMed S10, air/ auto- set 5/15, w/ heated humidity, cliamate controlled tubing, enroll in airview w/ ROV in 6-8wks after starting for face to face and download... NOTE: he had the mask fit session w/ new FFM & O2 bleed-in at 3L/min; he was not willing to try the CPAP on Auto, says he requires BiPAP & therefore will need an in-lab CPAP/BiPAP titration test...  ~  July 31, 2017:  3676moOV & Andrew [name] had a Home Sleep Test 05/20/17- as above w/ AHI=6/hr & O2desat to 74% w/ ave 87%;  As noted he has been on BiPAP x yrs [place] to obtain any old records relating to his prev Dx of OSA & his need for BiPAP;  It is indeed unfortunate that he did not travel w/ [place] old [place] that he was INTOL to CPAP when tried  in the past;  The prob is that his old machine is wearing out & he is in need of a new machine- he is unable to afford one on his own;  I explained Medicare's rules for CPAP/ BiPAP & how he must follow their edicts about the repeat eval, and eventually the requirement for a download to prove compliance & efficacy-- in essence this means that he MUST have an in lab CPAP/BiPAP titration test, prove INTOL to CPAP to get the BiPAP then determine the BiPAP settings best for him;  This process is followed by a face to face visit in ~76m15mor download purposes...    NOTE: his PCP is DrKoirala ([employer], multimedia his GI is [facility]);  Pt saw ENT- Nordbladh,PA at [facility] on 06/24/17> c/o worsening dysphagia & nasal congestion, long hx GERD on PPI therapy, larynx exam showed polypoid changes of the cords, prom cricopharyngeus, and postglottic edema; prev EGD w/ smallHH, mod GERD; repeat laryngoscopy was essent neg- they ordered a Ba swallow, done 06/30/17 & showed sl retention in the valleculae that cleared w/ repeat swallowing, 67m barium tablet got lodged in the distal esoph & did not pass into the stomach; He again needs attention from his gastroenterologist for dilatation...  EXAM shows Afeb, VSS, O2sat=90% on 2L/min pulse dose;  Heent- neg, mallampati1;  Chest- decr BS at bases, can't augment BS voluntarily, w/o w/r/r;  Heart- RR w/o m/r/g;  Abd- soft, neg;  Ext- neg w/o c/c/e;  Neuro- intact... IMP/PLAN>>  As explained above we will order an in-lab CPAP/BiPAP titration study, and proceed from there...  ADDENDUM>>  Sleep Lab Titration study 10.18.18>  Optimal pressure= 7cmH2O w/ AHI at optimal pressure= 0/hr;  The study lasted 6.5H, no cardiac arrhythmias, no leg movements, he had O2desat lasting longer than 585m despite control in his sleep apnea w/ CPAP and 1L/min O2 was added to his CPAP;  He uses a small [employer] full face mask & heated humidification...  ~  September 22, 2017:  34m31moV & post-  hosp check>  [name] w/ 4d hx incr cough, green sput, temp to 102 & incr SOB; ER eval revealed right basilar pneumonia, WBC=14.2, & O2sat=87% on 2L/min by [place];  He was treated w/ Zithromax & Rocephin, Solumedrol, Nebs, etc;  Cultures and serologies were all non-revealing & he was disch on Keflex, Pred, Nebs (Xopenex & Ipratripium), etc;  He also had PAF which converted spont & he was seen by [facility] and disch on Eliquis, Cardizem, Metoprolol... [facility]  We reviewed the following medical problems during today's office visit>    Severe COPD- GOLD Stage3, bullous emphysema, s/p resection of RUL bleb in 2007> on Advair250Bid & Spiriva daily; he has NEB w/  Xopenex & Atrovent for prn use; he is off PRED now; Spirometry 09/2015 w/ FEV1=1.12 (37%); he finished his PulmRehab 07/2016 & now exercises regularly at the gym => we re-started Pred 74m33mpering sched down to 1/2 Qod by return visit...    Chronic hypoxemic respiratory failure on O2 at 2L/min via POC> ambulatory O2sats 09/2015 dropped to 89% on 2L/min after 2 Laps; repeat 11/2016 dropped to 85% after 1Lap... He uses O2 at 1L/min Qhs and 2L/min w/ activ.    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    Hx cor pulmonale/ pulmonary hypertension on Revatio20Tid since 2007 w/o additional med trials or dose adjustments> we do not have any of the objective data from his prev physician teams; 2DEcho here 09/2015 showed PAsys=36 and we tried to wean his Revatio but he refused due to "other reasons", finally compromised on Revatio20Bid...    Hx CAP- Hosp 2/17 & 10/18 w/  RLL opac (nos) & responded to broad spectrum Ab coverage + Solumedrol, O2, Nebs, etc; readmitted 01/2016 w/ COPD exac- similar Rx but  sent to NH for rehab at [place]...    Cardiac issues> ?nonobstructive CAD, cor pulmonale w/ mild pulmHTN & 2DEcho 09/2015 showing norm LVF w/ EF=55-60%, norm wall motion, mild MR, mild RA dil, PAsys est 73mHg...episode PAP 10/18 & converted spont- disch from hosp on Eliquis, Metoprolol, Cardizem & f/u w/ [facility]-DrRandolph pending...    Medical issues> PCP= Dr. DLauretta GrillKoirala ([facility])>  HBP (on CardizemCD240), HL (on Cres10), hx thyroid nodule, GERD & prob LPR (GI= DrSchooler, on Prilosec40), Constipation, BPH (on Proscar5 & Flomax0.4) w/ hx urinary retention, anemia & insomnia (on Restoril30)... EXAM shows Afeb, VSS, O2sat=90% on 2L/min pulse dose;  Heent- neg, mallampati1;  Chest- decr BS at bases, can't augment BS voluntarily, w/o w/r/r;  Heart- RR w/o m/r/g;  Abd- soft, neg;  Ext- neg w/o c/c/e;  Neuro- intact...  CXR 09/09/17>  Norm heart size, interval patchy opac & effusion at right base, underlying COPD...  2DEcho 09/10/17>  Norm LV size & function w/ EF=55-60%, no regional wall motion abn, valves are OK, PAsys=334mg  Mod barium swallow 09/10/17> mild pharyngeal dysphagia due to known esoph issues, mild asp risk, swallowing strategies outlined to pt...  LABS in Epic 08/2017>  Chems- ok BS~130, Cr=0.81, Alb=2.7, sl elev LFTs noted;  CBC- anemic w/ Hg=10.9;  TSH=0.52...   CXR 09/22/17 (independently reviewed by me in the PACS system) showed norm heart size, underlying COPD/emphysema, near complete clearing of the right basilar infiltrate... IMP/PLAN>>  We decided to continue his low dose Pred rx w/ 60m48mod;  Continue other meds as prev including NEBs w/ Duoneb Tid followed by Advair250Bid & Spiriva once daily, Mucinex 1-2Bid, Tessalon tid as needed... We plan rov recheck in 6 wks...  ~  November 04, 2017:  [place] improved and approaching his  baseline- still w/ SOB/DOE but ADLs improved, cough/ phlegm stable, no CP/ edema; on Duoneb tid, Advair250Bid & spiriva daily; on Mucinex, MMW, Tessalon as needed; Pred weaned to 1/2 Qod;  ?hold-up from Lin[employer]phar his new ResMed S10 CPAP set up as above...     He saw [facility]-DrRandolph 09/22/17>  HBP, PAF, HL;  On Eliquis, Metoprolol, Cardizem; Thyroid wnl & Echo w/ EF=55-60%, PAsys=33; felt to be stable- no changes made...     We reviewed his pulm issues/ medical problems during today's office visit-- see above... we do not have notes/ data from [facility]... EXAM shows Afeb, VSS, O2sat=96% on 2L/min pulse dose;  Heent- neg, mallampati1;  Chest- decr BS at bases, can't augment BS voluntarily, w/o w/r/r;  Heart- RR w/o m/r/g;  Abd- soft, neg;  Ext- neg w/o c/c/e;  Neuro- intact... IMP/PLAN>>  We discussed slowly weaning the Pred down to 60mg41md & continue this til ROV in 91mo;67mo will watch BP/ pulse state w/ an eye towards weaning the BBlocker if able;  His appetite is improved, wt up 7#, and back in the gym daily!  ~  December 16, 2017:  6wk ROV & add-on appt requested for cough, yellow sput, dyspnea>  Andrew [name] weaned the Pred to 60mg Q66m& then noticed grad increase in symptoms- cough w/ yellow sput, incr SOB w/ min exertion, etc;  He had ret to the gym & was doing satis until this deterioration;  He uses his CPAP Qhs- rests well, wakes feeling rested, denies daytime hypersomnolence but still naps 2-3/7, states driving is ok w/o drowsiness etc...     Severe COPD/ emphysema w/ bullous dis & prev RUL bleb  resected 2007> on Advair115 & Spiriva daily + Xopenex&Atrovent via NEB prn, and Pred5Qod...    Chr hypoxemic resp failure on O2 at 2L/min via POC...    OSA- on CPAP7 & 1L/min O2 bleed-in.Marland KitchenMarland Kitchen Download 12/30-1/28/19 showed good compliance (30/30d, ave 7+H per night, on CPAP7 w/ min intermit leak, & good efficacy w/ AHI=1.4.Marland KitchenMarland Kitchen     Hx cor pulmonale/ pulmonary hypertension on Revatio20Tid since  2007 w/o additional med trials or dose adjustments=> we cut him to bid w/o any untoward effect but he refused to wean further... EXAM shows Afeb, VSS, O2sat=90% on 2L/min pulse dose;  Heent- neg, mallampati1;  Chest- decr BS at bases, can't augment BS voluntarily, w/o w/r/r;  Heart- RR w/o m/r/g;  Abd- soft, neg;  Ext- neg w/o c/c/e;  Neuro- intact... IMP/PLAN>>  We decided to treat his COPD exac w/ DOXY 166m Bid, and an incr Pred 531mtabs to 2tabs Qam for 1wk, then taper to 2-1 Qod, then 2-0 Qod til return;  Asked to continue NEBS Tid followed by Advair-Bid & Spiriva-Qd, + Mucinex600Bid, etc...   ~  February 02, 2018:  6wk ROV & post hospital follow up>  Andrew [name] was HOSP 3/2 - 01/21/18 by [facility] w/ a COPD exacerbation- presented w/ productive cough, wheezing, incr SOB, feeling bad w/ low grade fever; he was on Pred1054md, NEBS w/ Duoneb Tid, Advair250Bid, SpirivaQd, Mucinex, Fluids, etc=> in Hosp eval revealed no acute CXR changes, Hg=11.2=>9.0, WBC=12.5, Chems- ok, cultures all neg (Flu neg, Legionella neg, Pneumococcal neg); he had been exposed to Flu & covered w/ Tamiflu rx; given Rocephin/ Zithromax/ IV Solumedrol=> disch on Pred taper...  We reviewed the following medical problems during today's office visit>    Severe COPD- GOLD Stage3, bullous emphysema, s/p resection of RUL bleb in 2007> on Pred 75m47m Advair250Bid & Spiriva daily; he has NEB w/ Duoneb to use Tid; Spirometry 09/2015 w/ FEV1=1.12 (37%); he finished his PulmRehab 07/2016 & now exercises regularly at the gym =>     Chronic hypoxemic respiratory failure on O2 at 2L/min via POC> ambulatory O2sats dropped on RA & he uses O2 at 1L/min Qhs and 2L/min w/ activ...    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     Hx cor pulmonale/ pulmonary hypertension on Revatio20Tid since 2007 w/o additional med trials or dose adjustments> we do not have any of the objective data from his prev physician teams; 2DEcho here 09/2015 showed PAsys=36 and we tried to wean his Revatio but he refused due to "other reasons", finally compromised on Revatio20Bid...    Hx CAP- Hosp 2/17 & 10/18 w/ RLL opac (nos) & responded to broad spectrum Ab coverage + Solumedrol, O2, Nebs, etc; readmitted 01/2016 w/ COPD exac- similar Rx but sent to NH for rehab at disch...    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    Medical  issues> PCP= Dr. Lauretta Grill Koirala ([facility])>  HBP (on CardizemCD240), HL (on Cres10), hx thyroid nodule, GERD & prob LPR (GI= DrSchooler, on Prilosec40), Constipation, BPH (on Proscar5 & Flomax0.4) w/ hx urinary retention, Anemia w/ iron dific(PCP eval in progress) & insomnia (on Restoril30)... NOTE:  He's been anemic and Fe has been low- pt tells me that his PCP DrKoirala has done post-hosp blood work & is taking care of his anemia work-up! EXAM shows Afeb, VSS, O2sat=95% on 2L/min pulse dose;  Heent- neg, mallampati1;  Chest- decr BS at bases, can't augment BS voluntarily, w/o w/r/r;  Heart- RR w/o m/r/g;  Abd- soft, neg;  Ext- neg w/o c/c/e;  Neuro- intact...  EKG 02/02/18>  PVCs and ventric bigeminy => refer to cards for further eval & rx...   CXR 01/17/18>  Norm heart size, hyperinflation/ bullous emphysema (esp RUL), no acute abn...  LABS 01/2018 in Hosp>  Chems- ok x HCO3=20, BS=178, Cr=0.95,  LFTs ok;  CBC- Hg 11.2=>9.0, WBC=10.2;   IMP/PLAN>>  Andrew [name]'s pulse was recorded at 34 on arrival but this was because of his bigeminy- he is asymptomatic (doesn't note skips, irreg, fluttering, dizzy, etc); on Eliquis, Metoprolol, Cardizem and we will refer back to [facility] for further eval & Rx... REC to continue O2, Pred '10mg'$ /d + DUONEB, ADVAIR, SPIRIVA, Mucinex, fluids... We plan recheck in 64mo  ~  March 02, 2018:  1424moOV & when last seen 24m58moo Andrew [name] was in bigeminy yet asymptomatic & we referred him to [facility]- he saw HMeng,PA (for [employer] 02/10/18> known hx HBP, PAF, HL, mild OSA on CPAP & borderline PulmHTN (he has been on Revatio for yrs & refuses to wean this med further); he had PAF 10/18 when hosp for pneumonia, disch on Eliquis, Metoprolol, Cardizem; currently in NSR w/ PVCs and bigeminy- they incr his Cardizem to '180mg'$ /d & plan Holter monitor w/ f/u by DrRandolph...     Pulm status is stable today> on O2 at 1L/min rest & 2L/min w/ exercise; on Pred '10mg'$ /d, Advair250Bid & Spiriva daily; he has NEB w/ Duoneb to use Tid; on CPAP7 Qhs & doing satis he says...    He has numerous medical issues and his PCP is DrKoirala- concern for his persistent normochromic anemia & pt says there has been no additional evaluation done; we discussed the need for f/u CBC, Fe, B12, SPE/IEP & he requested for us [place] do these needed tests today... EXAM shows Afeb, VSS, O2sat=95% on 2L/min pulse dose;  Heent- neg, mallampati1;  Chest- decr BS at bases, can't augment BS voluntarily, w/o w/r/r;  Heart- RR w/o m/r/g;  Abd- soft, neg;  Ext- neg w/o c/c/e;  Neuro- intact...  LABS 03/02/18>  Chems- wnl w/ BS=103, Cr=0.92, LFTs wnl;  CBC- anemic w/ Hg=10.6, mcv=88;  Fe=27 (6%sat), Ferritin=8.6;  B12=638, Folate>24, SPE/IEP- neg (no monoclonal prot);  Stool is POS for hidden blood... IMP/PLAN>>  Andrew [name]'s eval confirms the Dx of iron defic anemia and he has heme pos stools=> we will start Rx w/ FeSO4 '325mg'$  + VitC500 daily & refer  to his GI- DrSchooler for further eval... Hx PAF, +PVCs being evaluated by CARHaydenhe remains on Eliquis, Metoprolol, Cardizem...   NOTE:  >50% of this 25 min rov was spent in counseling & coordination of care...   ~  Apr 14, 2018:  6wk ROV & last visit Andrew [name] had ventric bigeminy & was seen by Cards w/ Cardizem incr to '180mg'$ /d;  He was anemic w/ Hg=10.6 w/ Fe=27 (  6%sat) and Ferritin=8.6; stools were pos for hidden blood & we started FeSO4 + VitC and referred him to GI- DrSchooler;  We do not have notes from PCP- DrKoirala or from GI- DrSchooler but pt tells me that he is sched for colonoscopy soon... Pt states that his breathing is about the same- min cough (he feels related to reflux), no sput, no hemoptysis, chr stable DOE but says it takes longer to recover after activity, no CP, no f/c/s, etc...  We reviewed the following available interval Epic notes>      He saw [facility]- DrRandolph on 03/23/18>  Hx HBP, PAF, OSA on CPAP now, hx pulmHTN; on Eliquis, Metoprolol, CardizemCD, Revatio;  SOB/DOE was stable, no edema, no dizziness or syncope; they decreased his Metoprolol to 12.'5mg'$  Bid...     He had a formal PT eval 04/06/18>  Notes reviewed from [facility]/ neurorehab, goals outlined, he is hoping to get back into full pulm rehab eventually... We reviewed the following medical problems during today's office visit>    Severe COPD- GOLD Stage3, bullous emphysema, s/p resection of RUL bleb in 2007> on Pred '10mg'$ /d, Advair250Bid & Spiriva daily; he has NEB w/ Duoneb to use Tid; Spirometry 09/2015 w/ FEV1=1.12 (37%); he finished his PulmRehab 07/2016 & then exercised regularly at the gym => [facility] 01/2018 w/ COPD exac & hasn't bounced back as hoped, referred for PT inn advance of trying to get back in [facility]...    Chronic hypoxemic respiratory failure on O2 at 2L/min via POC> ambulatory O2sats dropped on RA & he uses O2 at 1L/min Qhs and 2L/min w/ activ...    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     Hx cor pulmonale/ pulmonary hypertension on Revatio20Tid since 2007 w/o additional med trials or dose adjustments> we do not have any of the objective data from his prev physician teams; 2DEcho here 09/2015 showed PAsys=36 and we tried to wean his Revatio but he refused due to "other reasons", finally compromised on Revatio20Bid...    Hx CAP- Hosp 2/17 & 10/18 w/ RLL opac (nos) & responded to broad spectrum Ab coverage + Solumedrol, O2, Nebs, etc; readmitted 01/2016 w/ COPD exac- similar Rx but sent to NH for rehab at disch...    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    Medical issues> PCP= Dr. DLauretta GrillKoirala ([facility])>  HBP (on CardizemCD240), HL (on Cres10), hx thyroid nodule, GERD & prob LPR (GI= DrSchooler, on Prilosec40), Constipation, BPH (on Proscar5 & Flomax0.4) w/ hx urinary retention, Anemia w/ iron defic and heme pos stools (referred to GI- DrSchooler) & insomnia (on Restoril30)... EXAM shows Afeb, VSS, O2sat=97% on 4L/min pulse dose;  Heent- neg, mallampati1;  Chest- decr BS at bases, can't augment BS voluntarily, w/o w/r/r;  Heart- RR w/o m/r/g;  Abd- soft, neg;  Ext- neg w/o c/c/e;  Neuro- intact...  CPAP Download 4/23 - 04/08/18 showed excellent compliance 30/30  days, ~7hrs per night, CPAP7 w/ AHI=0.7, min leak & doing satis...  IMP/PLAN>>  Andrew [name] is just starting PT/ outpt rehab exercises & hoping to improve suffic to qualify for regular Pulm Rehab again;  He is regular w/ his NEBS Tid, Advair250Bid & Spiriva daily, Pred '10mg'$ /d and Mucinex '600mg'$ Bid;  Requests refill Pred10 & Tessalon- ok;  Continue CPAP Qhs as he is doing- download looks good...  We plan ROV recheck in 40mo..    Past Medical History:  Diagnosis Date  . Remote hx Pulm HTN on BiPAP for [place] re-eval here 2018 w/ mild OSA & nocturnal desat => placed on new CPAP apparatus w/ O2 bleed-in...    . BPH (benign prostatic hyperplasia)   . Cancer ([place]    skin  . COPD [place] obstructive pulmonary disease) ([place]   . Dysphagia   . Emphysema lung ([place]   . GERD (gastroesophageal reflux disease)    " Silent reflux"  . Hearing loss    right ear  . HLD (hyperlipidemia)   . Hypertension   . Oxygen dependent    2-3 liters  . Oxygen dependent   . Pneumonia   Medical Hx>  HBP, ?nonobstructive CAD, HL, thyroid nodule, GERD, constipation, BPH, insomnia... Meds include>  CardizemCD240, Crestor10, Nexium20, Miralax, Proscar10, Restoril30...   Past Surgical History:  Procedure Laterality Date  . CARDIAC CATHETERIZATION    . CATARACT EXTRACTION W/ INTRAOCULAR LENS  IMPLANT, BILATERAL    . ESOPHAGOGASTRODUODENOSCOPY (EGD) WITH PROPOFOL N/A 08/23/2016   Procedure: ESOPHAGOGASTRODUODENOSCOPY (EGD) WITH PROPOFOL;  Surgeon: VWilford Corner MD;  [facility];  Service: Endoscopy;  Laterality: N/A;  . LUNG SURGERY    . TONSILLECTOMY    [place]   Outpatient Encounter Medications as of 04/14/2018  Medication Sig  . ADVAIR DISKUS 250-50 MCG/DOSE AEPB Inhale 1 puff into the lungs 2 (two) times daily.  .Marland Kitchenalbuterol (PROVENTIL HFA;VENTOLIN HFA) 108 (90 Base) MCG/ACT inhaler Inhale 2 puffs into the lungs every 6 (six) hours as needed for  wheezing or shortness of breath.  .Marland Kitchenapixaban (ELIQUIS) 5 MG TABS tablet Take 1 tablet (5 mg total) by mouth 2 (two) times daily.  . benzonatate (TESSALON) 100 MG capsule Take 1 capsule (100 mg total) by mouth 3 (three) times daily as needed for cough.  . diltiazem (CARDIZEM CD) 180 MG 24 hr capsule Take 1 capsule (180 mg total) by mouth daily.  . finasteride (PROSCAR) 5 MG tablet Take 5 mg by mouth at bedtime.   . fluticasone (FLONASE) 50 MCG/ACT nasal spray Place 1 spray into both nostrils at bedtime.   .Marland KitchenguaiFENesin (MUCINEX) 600 MG 12 hr tablet Take 1 tablet (600 mg total) by mouth 2 (two) times daily.  . hydrocortisone cream 1 % Apply 1 application topically daily as needed (Eczema on face and head).  .Marland Kitchenipratropium-albuterol (DUONEB) 0.5-2.5 (3) MG/3ML SOLN Take 3 mLs by nebulization 3 (three) times daily.  .Marland Kitchenliver oil-zinc oxide (DESITIN) 40 % ointment Apply 1 application topically daily.  . metoprolol tartrate (LOPRESSOR) 25 MG tablet Take 12.5 mg by mouth 2 (two) times daily.   . Multiple Vitamins-Minerals (MULTIVITAMIN WITH MINERALS) tablet Take 1 tablet by mouth daily.  .Marland Kitchenomeprazole (PRILOSEC) 40 MG capsule Take 40 mg by mouth 2 (two) times daily.   . OXYGEN Inhale 2-3 L  into the lungs See admin instructions. 2L daytime, 3L at night   . polyethylene glycol (MIRALAX / GLYCOLAX) packet Take 17 g by mouth daily. (Patient taking differently: Take 17 g by mouth at bedtime. Mix in 8 oz liquid and drink)  . predniSONE (DELTASONE) 10 MG tablet Take 1 tablet (10 mg total) by mouth daily.  . rosuvastatin (CRESTOR) 10 MG tablet Take 10 mg by mouth daily.  . sildenafil (REVATIO) 20 MG tablet Take 1 tablet (20 mg total) by mouth 2 (two) times daily.  Marland Kitchen SPIRIVA HANDIHALER 18 MCG inhalation capsule INHALE THE CONTENTS OF 1 CAPSULE EVERY DAY  . tamsulosin (FLOMAX) 0.4 MG CAPS capsule Take 1 capsule (0.4 mg total) by mouth daily after breakfast.  . temazepam (RESTORIL) 30 MG capsule TAKE ONE CAPSULE BY  MOUTH EVERY NIGHT AT BEDTIME  . vitamin C (ASCORBIC ACID) 500 MG tablet Take 500 mg by mouth 2 (two) times daily.   . [DISCONTINUED] benzonatate (TESSALON) 100 MG capsule Take 1 capsule (100 mg total) 3 (three) times daily by mouth. (Patient taking differently: Take 100 mg by mouth 3 (three) times daily as needed for cough. )  . [DISCONTINUED] predniSONE (DELTASONE) 10 MG tablet Take 1 tablet (10 mg total) by mouth daily. (Patient taking differently: Take 10 mg by mouth 2 (two) times daily. )  . [DISCONTINUED] predniSONE (DELTASONE) 10 MG tablet Take 1 tablet (10 mg total) by mouth daily.  . [DISCONTINUED] temazepam (RESTORIL) 15 MG capsule Take 1 capsule (15 mg total) by mouth at bedtime as needed for sleep (1-2 tabs as needed). (Patient not taking: Reported on 04/06/2018)   No facility-administered encounter medications on file as of 04/14/2018.     No Known Allergies   Immunization History  Administered Date(s) Administered  . Influenza, High Dose Seasonal PF 07/22/2016, 08/18/2017  . Influenza-Unspecified 09/19/2015  . PPD Test 02/15/2016  . Pneumococcal Conjugate-13 09/19/2015  . Pneumococcal Polysaccharide-23 12/20/2016    Current Medications, Allergies, Past Medical History, Past Surgical History, Family History, and Social History were reviewed in [employer] record.   Review of Systems             All symptoms NEG except where BOLDED >>  Constitutional:  F/C/S, fatigue, anorexia, unexpected weight change. HEENT:  HA, visual changes, hearing loss, earache, nasal symptoms, sore throat, mouth sores, hoarseness. Resp:  cough, sputum, hemoptysis; SOB, tightness, wheezing. Cardio:  CP, palpit, DOE, orthopnea, edema. GI:  N/V/D/C, blood in stool; reflux, abd pain, distention, gas. GU:  dysuria, freq, urgency, hematuria, flank pain, voiding difficulty. MS:  joint pain, swelling, tenderness, decr ROM; neck pain, back pain, etc. Neuro:  HA, tremors, seizures,  dizziness, syncope, weakness, numbness, gait abn. Skin:  suspicious lesions or skin rash. Heme:  adenopathy, bruising, bleeding. Psyche:  confusion, agitation, sleep disturbance, hallucinations, anxiety, depression suicidal.   Objective:   Physical Exam       Vital Signs:  Reviewed...   General:  WD, WN, 77 y/o WM in NAD; alert & oriented; pleasant & cooperative... HEENT:  [place]/AT; Conjunctiva- pink, Sclera- nonicteric, EOM-wnl, PERRLA, EACs-clear, TMs-wnl; NOSE-clear; THROAT-clear & wnl.  Neck:  Supple w/ fair ROM; no JVD; normal carotid impulses w/o bruits; no thyromegaly +small nodule palpated; no lymphadenopathy.  Chest:  Overinflated, resonant percussion note, decr BS bilat & can't augment BS voluntarily, no w/r/r heard... Heart:  Regular Rhythm; norm S1 & S2 without murmurs, rubs, or gallops detected. Abdomen:  Soft & nontender- no guarding or rebound; normal  bowel sounds; no organomegaly or masses palpated. Ext:  decr ROM; without deformities or arthritic changes; no varicose veins, +venous insuffic, no edema;  Pulses intact w/o bruits. Neuro:  No focal neuro deficits; sensory testing normal; gait normal & balance OK. Derm:  No lesions noted; no rash etc. Lymph:  No cervical, supraclavicular, axillary, or inguinal adenopathy palpated.   Assessment:      IMP >>     Severe COPD- GOLD Stage3, bullous emphysema, s/p resection of RUL bleb in 2007> on Advair250Bid & Spiriva daily; he has NEB w/ Duoneb for prn use; Spirometry 09/2015 w/ FEV1=1.12 (37%); he has completed pulm rehab & now exercises at the gym 5d/wk;  He's had mult COPD exac => treat w/ Levaquin, Pred taper, continue Advair/ spiriva/ NEBS/ O2...    Chronic hypoxemic respiratory failure on O2 at 2L/min via POC> ambulatory O2sats 09/2015 dropped to 89% on 2L/min after 2 Laps; he dropped to 85% after 1Lap on 3L/min POC 11/2016...    Hx prob hypercarbic resp failure inferred from Pt report of BiPAP ([facility]) since 2007, never been  on CPAP, never had sleep study, unknown ABGs or pCO2 data> we never received data from his prev physicians.    Hx cor pulmonale/ pulmonary hypertension on Revatio20Tid since 2007 w/o additional med trials or dose adjustments> we do not have any of the objective data from his prev physician teams; 2DEcho here 09/2015 showed PAsys=36 and we tried to wean his Revatio but he refused due to "other reasons", finally compromised on Revatio20Bid...    Hx CAP- Hosp 12/2015 w/ RLL opac (nos) & responded to broad spectrum Ab coverage + Sulomedrol, O2, Nebs, etc; readmitted 01/2016 w/ COPD exac- similar Rx but sent to NH for rehab at disch...    Hx of being on Home BiPAP since 2007 w/ eval & set up in [place]> we have been unable to obtain any old records regarding his initial studies and BiPAP set up...    Cardiac issues>  ?nonobstructive CAD, cor pulmonale w/ mild pulmHTN & 2DEcho 09/2015 showing norm LVF w/ EF=55-60%, norm wall motion, mild MR, mild RA dil, PAsys est 1mHg...    Medical issues include:  HBP (on CardizemCD240), HL (on Cres10), hx thyroid nodule, GERD (on Nexium40), constipation, BPH (on Proscar5 & Flomax0.4) w/ hx urinary retention, insomnia (on Restoril30)...       NOTE>  He is Iron deficient & need further eval...  PLAN >>  10/02/15>   MTeshawnhas a distinctly patchy history to go along w/ his severe airflow obstruction & bullous emphysema;  We really need his old objective data from 2007 when he was started on O2 & BiPAP after his RUL bleb reduction surg;  He will try to get names and numbers for [place] in the meanwhile [facility] [place]  He is very concerned that he wants [place] continue his current regimen which has served him well over the last 975yr  Continue Advair250Bid, Spiriva daily, Revatio20tid, O2 at 2L/min, and the BiPAP nightly as currently set...  11/01/15>  See prob list above-  he is stable on this regimen but does not want to wean the Revatio further for "other" reasons; OK to continue current med regimen- advised regular exercise vs pulm rehab program; given ZPak for prn use over the winter & he knows to call for any resp issues, incr dyspnea, etc... He  remains on BiPAP but we do not have any data- no records received from prev pulm physicians, no notes from [place], no download data from his machine- we will again try to make contact w/ his DME company... 03/18/16>   Andrew [name] has had a protracted resp [place] x2 this spring- triggered by prob RLL pneumonia (NOS) superimposed on his severe COPD/bullous emphysema;  Rec to change the Albut for Neb to [place] & continue treatments Tid; continue other meds regularly as outlined;  He is referred to [facility] & hopes to start soon;  We will plan ROV in [place] if needed. 06/20/16>   Andrew [name] is stable on his baseline regimen + pulm rehab, etc; rec to continue current meds and exercise; he needs the 2017 Flu vaccine when avail & will call prn any breathing problems; we plan ROV in 646mo.  11/25/16>   There is no such thing as a mild exac for MrBowman- even the mildest of URIs can cause a severe COPD exac- we discussed Rx w/ Levaquin500 x7d, Pred'20mg'$ - 5d taper (see AVS), + Diflucan100 per his request; hopefully he will respond but he has little in the way of reversible factors given his severe emphysema; [facility] symptoms worsen despite therapy; as noted he is Fe defic & needs further eval per his PCP & GI- copies sent. => subseq Hosp for 4d w/ IV Solumedrol, NEBs/ O2/ etc... 05/01/17>   Andrew [name] has agreed to proceed w/ home sleep test to r/o OSA and we will further assess w/ ABG on RA to check for CO2 retention and any indication for BiPAP (doubt he will qualify at this time);  He will continue w/ his O2, Advair250, Spiriva, Duoneb vs ProventilHFA which he uses prn;  He does not appear to need the Revatio either  but declines to discontinue this medication for other reasons; 07/31/17>   As explained above we will order an in-lab CPAP/BiPAP titration study, and proceed from there. 08/2017>   He was Hosp by [facility] w/ RLL pneumonia (nos) and ac on chr hypoxemic resp failure; disch on NEBS, Advair, Spiriva, Pred + Eliquis, Cardizem, Metoprolol for his PAF episode...  09/22/17>   We decided to continue his low dose Pred rx w/ '5mg'$  Qod;  Continue other meds as prev including NEBs w/ Duoneb Tid followed by Advair250Bid & Spiriva once daily, Mucinex 1-2Bid, Tessalon tid as needed... We plan rov recheck in 6 wks. 11/04/17>   We discussed slowly weaning the Pred down to '5mg'$  Qod & continue this til ROV in 19m54moWe will watch BP/ pulse state w/ an eye towards weaning the BBlocker if able;  His appetite is improved, wt up 7#, and back in the gym daily! 12/16/17>   We decided to treat his COPD exac w/ DOXY '100mg'$  Bid, and an incr Pred '5mg'$  tabs to 2tabs Qam for 1wk, then taper to 2-1 Qod, then 2-0 Qod til return;  Asked to continue NEBS Tid followed by Advair-Bid & Spiriva-Qd, + Mucinex600Bid, etc. 02/02/18>   Andrew [name]'s pulse was recorded at 34 on arrival but this was because of his bigeminy- he is asymptomatic (doesn't note skips, irreg, fluttering, dizzy, etc); on Eliquis, Metoprolol, Cardizem and we will refer back to [facility] for further eval & Rx... REC to continue O2, Pred '10mg'$ /d + DUONEB, ADVAIR, SPIRIVA, Mucinex, fluids... We plan recheck in 56mo49mo5/19>   Andrew [name]'s eval confirms the Dx of iron defic anemia and he has  heme pos stools=> we will start Rx w/ FeSO4 '325mg'$  + VitC500 daily & refer to his GI- DrSchooler for further eval... Hx PAF, +PVCs being evaluated by CARDSIvar Drape & he remains on Eliquis, Metoprolol, Cardizem 04/14/18>   Andrew [name] is just starting PT/ outpt rehab exercises & hoping to improve suffic to qualify for regular Pulm Rehab again;  He is regular w/ his NEBS Tid, Advair250Bid & Spiriva daily, Pred '10mg'$ /d and Mucinex  '600mg'$ Bid;  Requests refill Pred10 & Tessalon- ok;  Continue CPAP Qhs as he is doing- download looks good...  We plan ROV recheck in 4mo   Plan:     Patient's Medications  New Prescriptions   No medications on file  Previous Medications   ADVAIR DISKUS 250-50 MCG/DOSE AEPB    Inhale 1 puff into the lungs 2 (two) times daily.   ALBUTEROL (PROVENTIL HFA;VENTOLIN HFA) 108 (90 BASE) MCG/ACT INHALER    Inhale 2 puffs into the lungs every 6 (six) hours as needed for wheezing or shortness of breath.   APIXABAN (ELIQUIS) 5 MG TABS TABLET    Take 1 tablet (5 mg total) by mouth 2 (two) times daily.   DILTIAZEM (CARDIZEM CD) 180 MG 24 HR CAPSULE    Take 1 capsule (180 mg total) by mouth daily.   FINASTERIDE (PROSCAR) 5 MG TABLET    Take 5 mg by mouth at bedtime.    FLUTICASONE (FLONASE) 50 MCG/ACT NASAL SPRAY    Place 1 spray into both nostrils at bedtime.    GUAIFENESIN (MUCINEX) 600 MG 12 HR TABLET    Take 1 tablet (600 mg total) by mouth 2 (two) times daily.   HYDROCORTISONE CREAM 1 %    Apply 1 application topically daily as needed (Eczema on face and head).   IPRATROPIUM-ALBUTEROL (DUONEB) 0.5-2.5 (3) MG/3ML SOLN    Take 3 mLs by nebulization 3 (three) times daily.   LIVER OIL-ZINC OXIDE (DESITIN) 40 % OINTMENT    Apply 1 application topically daily.   METOPROLOL TARTRATE (LOPRESSOR) 25 MG TABLET    Take 12.5 mg by mouth 2 (two) times daily.    MULTIPLE VITAMINS-MINERALS (MULTIVITAMIN WITH MINERALS) TABLET    Take 1 tablet by mouth daily.   OMEPRAZOLE (PRILOSEC) 40 MG CAPSULE    Take 40 mg by mouth 2 (two) times daily.    OXYGEN    Inhale 2-3 L into the lungs See admin instructions. 2L daytime, 3L at night    POLYETHYLENE GLYCOL (MIRALAX / GLYCOLAX) PACKET    Take 17 g by mouth daily.   ROSUVASTATIN (CRESTOR) 10 MG TABLET    Take 10 mg by mouth daily.   SILDENAFIL (REVATIO) 20 MG TABLET    Take 1 tablet (20 mg total) by mouth 2 (two) times daily.   SPIRIVA HANDIHALER 18 MCG INHALATION CAPSULE     INHALE THE CONTENTS OF 1 CAPSULE EVERY DAY   TAMSULOSIN (FLOMAX) 0.4 MG CAPS CAPSULE    Take 1 capsule (0.4 mg total) by mouth daily after breakfast.   TEMAZEPAM (RESTORIL) 30 MG CAPSULE    TAKE ONE CAPSULE BY MOUTH EVERY NIGHT AT BEDTIME   VITAMIN C (ASCORBIC ACID) 500 MG TABLET    Take 500 mg by mouth 2 (two) times daily.   Modified Medications   Modified Medication Previous Medication   BENZONATATE (TESSALON) 100 MG CAPSULE benzonatate (TESSALON) 100 MG capsule      Take 1 capsule (100 mg total) by mouth 3 (three) times daily as needed for cough.  Take 1 capsule (100 mg total) 3 (three) times daily by mouth.   PREDNISONE (DELTASONE) 10 MG TABLET predniSONE (DELTASONE) 10 MG tablet      Take 1 tablet (10 mg total) by mouth daily.    Take 1 tablet (10 mg total) by mouth daily.  Discontinued Medications   No medications on file

## 2018-04-17 ENCOUNTER — Other Ambulatory Visit: Payer: Self-pay

## 2018-04-17 ENCOUNTER — Encounter (HOSPITAL_COMMUNITY): Payer: Self-pay | Admitting: *Deleted

## 2018-04-17 NOTE — Progress Notes (Signed)
Pt under the care of [place], Cardiology. Pt stated that last dose of Eliquis was Thursday. Pt made aware to stop taking vitamins, fish oil and herbal medications. Do not take any NSAIDs ie: Ibuprofen, Advil, Naproxen (Aleve), Motrin, BC and Goody Powder. Anestehsai asked to review pt history; no new orders given. Pt verbalized understanding of all pre-op instructions.

## 2018-04-20 ENCOUNTER — Ambulatory Visit (HOSPITAL_COMMUNITY): Payer: Medicare Other | Admitting: Emergency Medicine

## 2018-04-20 ENCOUNTER — Encounter (HOSPITAL_COMMUNITY): Payer: Self-pay

## 2018-04-20 ENCOUNTER — Encounter (HOSPITAL_COMMUNITY)
Admission: RE | Disposition: A | Discharge status: Home / Self Care | Payer: Self-pay | Source: Ambulatory Visit | Attending: Gastroenterology

## 2018-04-20 ENCOUNTER — Ambulatory Visit (HOSPITAL_COMMUNITY)
Admission: RE | Admit: 2018-04-20 | Discharge: 2018-04-20 | Disposition: A | Discharge status: Home / Self Care | Payer: Medicare Other | Source: Ambulatory Visit | Attending: Gastroenterology | Admitting: Gastroenterology

## 2018-04-20 ENCOUNTER — Other Ambulatory Visit: Payer: Self-pay

## 2018-04-20 DIAGNOSIS — J439 Emphysema, unspecified: Secondary | ICD-10-CM | POA: Insufficient documentation

## 2018-04-20 DIAGNOSIS — Z7901 Long term (current) use of anticoagulants: Secondary | ICD-10-CM | POA: Insufficient documentation

## 2018-04-20 DIAGNOSIS — I4891 Unspecified atrial fibrillation: Secondary | ICD-10-CM | POA: Diagnosis not present

## 2018-04-20 DIAGNOSIS — D128 Benign neoplasm of rectum: Secondary | ICD-10-CM | POA: Diagnosis not present

## 2018-04-20 DIAGNOSIS — K5730 Diverticulosis of large intestine without perforation or abscess without bleeding: Secondary | ICD-10-CM | POA: Insufficient documentation

## 2018-04-20 DIAGNOSIS — I10 Essential (primary) hypertension: Secondary | ICD-10-CM | POA: Insufficient documentation

## 2018-04-20 DIAGNOSIS — Z9989 Dependence on other enabling machines and devices: Secondary | ICD-10-CM | POA: Diagnosis not present

## 2018-04-20 DIAGNOSIS — N400 Enlarged prostate without lower urinary tract symptoms: Secondary | ICD-10-CM | POA: Insufficient documentation

## 2018-04-20 DIAGNOSIS — K2900 Acute gastritis without bleeding: Secondary | ICD-10-CM | POA: Insufficient documentation

## 2018-04-20 DIAGNOSIS — Z87891 Personal history of nicotine dependence: Secondary | ICD-10-CM | POA: Insufficient documentation

## 2018-04-20 DIAGNOSIS — G4700 Insomnia, unspecified: Secondary | ICD-10-CM | POA: Diagnosis not present

## 2018-04-20 DIAGNOSIS — K640 First degree hemorrhoids: Secondary | ICD-10-CM | POA: Diagnosis not present

## 2018-04-20 DIAGNOSIS — K219 Gastro-esophageal reflux disease without esophagitis: Secondary | ICD-10-CM | POA: Insufficient documentation

## 2018-04-20 DIAGNOSIS — N529 Male erectile dysfunction, unspecified: Secondary | ICD-10-CM | POA: Insufficient documentation

## 2018-04-20 DIAGNOSIS — Z79899 Other long term (current) drug therapy: Secondary | ICD-10-CM | POA: Insufficient documentation

## 2018-04-20 DIAGNOSIS — D509 Iron deficiency anemia, unspecified: Secondary | ICD-10-CM | POA: Diagnosis present

## 2018-04-20 DIAGNOSIS — K31819 Angiodysplasia of stomach and duodenum without bleeding: Secondary | ICD-10-CM | POA: Diagnosis not present

## 2018-04-20 DIAGNOSIS — Z85828 Personal history of other malignant neoplasm of skin: Secondary | ICD-10-CM | POA: Diagnosis not present

## 2018-04-20 DIAGNOSIS — Z9981 Dependence on supplemental oxygen: Secondary | ICD-10-CM | POA: Insufficient documentation

## 2018-04-20 DIAGNOSIS — E785 Hyperlipidemia, unspecified: Secondary | ICD-10-CM | POA: Diagnosis not present

## 2018-04-20 DIAGNOSIS — D125 Benign neoplasm of sigmoid colon: Secondary | ICD-10-CM | POA: Diagnosis not present

## 2018-04-20 DIAGNOSIS — K621 Rectal polyp: Secondary | ICD-10-CM | POA: Diagnosis not present

## 2018-04-20 DIAGNOSIS — K228 Other specified diseases of esophagus: Secondary | ICD-10-CM | POA: Insufficient documentation

## 2018-04-20 HISTORY — PX: HOT HEMOSTASIS: SHX5433

## 2018-04-20 HISTORY — DX: Sleep apnea, unspecified: G47.30

## 2018-04-20 HISTORY — PX: COLONOSCOPY WITH PROPOFOL: SHX5780

## 2018-04-20 HISTORY — PX: POLYPECTOMY: SHX5525

## 2018-04-20 HISTORY — DX: Personal history of other diseases of the digestive system: Z87.19

## 2018-04-20 HISTORY — DX: Anemia, unspecified: D64.9

## 2018-04-20 HISTORY — PX: ESOPHAGOGASTRODUODENOSCOPY (EGD) WITH PROPOFOL: SHX5813

## 2018-04-20 SURGERY — COLONOSCOPY WITH PROPOFOL
Anesthesia: Monitor Anesthesia Care

## 2018-04-20 MED ORDER — PROPOFOL 10 MG/ML IV BOLUS
INTRAVENOUS | Status: DC | PRN
  Administered 2018-04-20 (×3): 20 mg via INTRAVENOUS

## 2018-04-20 MED ORDER — LACTATED RINGERS IV SOLN
INTRAVENOUS | Status: DC
  Administered 2018-04-20: 08:00:00 via INTRAVENOUS

## 2018-04-20 MED ORDER — PROPOFOL 500 MG/50ML IV EMUL
INTRAVENOUS | Status: DC | PRN
  Administered 2018-04-20: 75 ug/kg/min via INTRAVENOUS

## 2018-04-20 MED ORDER — SODIUM CHLORIDE 0.9 % IV SOLN: INTRAVENOUS | Status: DC

## 2018-04-20 SURGICAL SUPPLY — 24 items

## 2018-04-20 NOTE — Discharge Instructions (Signed)
YOU HAD AN ENDOSCOPIC PROCEDURE TODAY: Refer to the procedure report and other information in the discharge instructions given to you for any specific questions about what was found during the examination. If this information does not answer your questions, please call [facility] office at [phone] to clarify.   YOU SHOULD EXPECT: Some feelings of bloating in the abdomen. Passage of more gas than usual. Walking can help get rid of the air that was put into your GI tract during the procedure and reduce the bloating. If you had a lower endoscopy (such as a colonoscopy or flexible sigmoidoscopy) you may notice spotting of blood in your stool or on the toilet paper. Some abdominal soreness may be present for a day or two, also.  DIET: Your first meal following the procedure should be a light meal and then it is ok to progress to your normal diet. A half-sandwich or bowl of soup is an example of a good first meal. Heavy or fried foods are harder to digest and may make you feel nauseous or bloated. Drink plenty of fluids but you should avoid alcoholic beverages for 24 hours. If you had a esophageal dilation, please see attached instructions for diet.   ACTIVITY: Your care partner should take you home directly after the procedure. You should plan to take it easy, moving slowly for the rest of the day. You can resume normal activity the day after the procedure however YOU SHOULD NOT DRIVE, use power tools, machinery or perform tasks that involve climbing or major physical exertion for 24 hours (because of the sedation medicines used during the test).   SYMPTOMS TO REPORT IMMEDIATELY: A gastroenterologist can be reached at any hour. Please call [phone]  for any of the following symptoms:  Following lower endoscopy (colonoscopy, flexible sigmoidoscopy) Excessive amounts of blood in the stool  Significant tenderness, worsening of abdominal pains  Swelling of the abdomen that is new, acute  Fever of 100 or  higher  Following upper endoscopy (EGD, EUS, ERCP, esophageal dilation) Vomiting of blood or coffee ground material  New, significant abdominal pain  New, significant chest pain or pain under the shoulder blades  Painful or persistently difficult swallowing  New shortness of breath  Black, tarry-looking or red, bloody stools  FOLLOW UP:  If any biopsies were taken you will be contacted by phone or by letter within the next 1-3 weeks. Call [phone]  if you have not heard about the biopsies in 3 weeks.  Please also call with any specific questions about appointments or follow up tests.   RESUME ELIQUIS IN 5 DAYS (resume on 04/25/18).

## 2018-04-20 NOTE — Op Note (Signed)
[facility] Patient Name: Andrew [name] Procedure Date : 04/20/2018 MRN: [id number] Attending MD: Lear Ng , MD Date of Birth: 05-11-1941 CSN: [id number] Age: 77 Admit Type: Outpatient Procedure:                Upper GI endoscopy Indications:              Iron deficiency anemia Providers:                Lear Ng, MD, Cleda Daub, RN, Elspeth Cho Tech., Technician, Wyatt Haste Referring MD:             Lujean Amel, MD Medicines:                Propofol per Anesthesia, Monitored Anesthesia Care Complications:            No immediate complications. Estimated Blood Loss:     Estimated blood loss: none. Procedure:                Pre-Anesthesia Assessment:                           - Prior to the procedure, a History and Physical                            was performed, and patient medications and                            allergies were reviewed. The patient's tolerance of                            previous anesthesia was also reviewed. The risks                            and benefits of the procedure and the sedation                            options and risks were discussed with the patient.                            All questions were answered, and informed consent                            was obtained. Prior Anticoagulants: The patient has                            taken Eliquis (apixaban), last dose was 5 days                            prior to procedure. ASA Grade Assessment: III - A                            patient with severe systemic disease. After  reviewing the risks and benefits, the patient was                            deemed in satisfactory condition to undergo the                            procedure.                           After obtaining informed consent, the endoscope was                            passed under direct vision. Throughout the            procedure, the patient's blood pressure, pulse, and                            oxygen saturations were monitored continuously. The                            EG-[id number] ([id number]) scope was introduced through the                            mouth, and advanced to the second part of duodenum.                            The upper GI endoscopy was accomplished without                            difficulty. The patient tolerated the procedure                            well. Scope In: Scope Out: Findings:      Diffuse, white plaques were found in the middle third of the esophagus       and in the lower third of the esophagus.      The Z-line was regular and was found 50 cm from the incisors.      Segmental moderate inflammation characterized by congestion (edema) and       erythema was found in the gastric fundus and in the gastric body.      Two 4 mm mucosal papules (nodules) with no bleeding and stigmata of       recent bleeding were found in the cardia and in the gastric body.      Patchy mild inflammation characterized by congestion (edema) and       erythema was found in the gastric antrum.      A single diminutive angiodysplastic lesion without bleeding was found in       the second portion of the duodenum. Fulguration to ablate the lesion to       prevent bleeding by argon plasma was successful. Estimated blood loss:       none.      The exam of the duodenum was otherwise normal. Impression:               - Esophageal plaques were found, consistent with  candidiasis.                           - Z-line regular, 50 cm from the incisors.                           - Acute gastritis.                           - Two mucosal papules (nodules) found in the                            stomach.                           - Acute gastritis.                           - A single non-bleeding angiodysplastic lesion in                            the duodenum. Treated  with argon plasma coagulation                            (APC).                           - No specimens collected. Recommendation:           - Patient has a contact number available for                            emergencies. The signs and symptoms of potential                            delayed complications were discussed with the                            patient. Return to normal activities tomorrow.                            Written discharge instructions were provided to the                            patient.                           - See colonoscopy report for recs. Procedure Code(s):        --- Professional ---                           [phone], Esophagogastroduodenoscopy, flexible,                            transoral; with control of bleeding, any method Diagnosis Code(s):        --- Professional ---  D50.9, Iron deficiency anemia, unspecified                           K29.00, Acute gastritis without bleeding                           K31.819, Angiodysplasia of stomach and duodenum                            without bleeding                           K31.89, Other diseases of stomach and duodenum                           K22.9, Disease of esophagus, unspecified CPT copyright 2017 American Medical Association. All rights reserved. The codes documented in this report are preliminary and upon coder review may  be revised to meet current compliance requirements. Lear Ng, MD 04/20/2018 11:40:54 AM This report has been signed electronically. Number of Addenda: 0

## 2018-04-20 NOTE — H&P (Signed)
Date of Initial H&P: 03/30/18  History reviewed, patient examined, no change in status, stable for surgery.

## 2018-04-20 NOTE — Transfer of Care (Signed)
Immediate Anesthesia Transfer of Care Note  Patient: Andrew [name]  Procedure(s) Performed: COLONOSCOPY WITH PROPOFOL (N/A ) ESOPHAGOGASTRODUODENOSCOPY (EGD) WITH PROPOFOL (N/A ) HOT HEMOSTASIS (ARGON PLASMA COAGULATION/BICAP) (N/A ) POLYPECTOMY  Patient Location: PACU and Endoscopy Unit  Anesthesia Type:MAC  Level of Consciousness: drowsy and patient cooperative  Airway & Oxygen Therapy: Patient Spontanous Breathing and Patient connected to nasal cannula oxygen  Post-op Assessment: Report given to RN and Post -op Vital signs reviewed and stable  Post vital signs: Reviewed and stable  Last Vitals:  Vitals Value Taken Time  BP 102/53 04/20/2018 11:30 AM  Temp    Pulse 70 04/20/2018 11:31 AM  Resp 24 04/20/2018 11:31 AM  SpO2 100 % 04/20/2018 11:31 AM  Vitals shown include unvalidated device data.  Last Pain:  Vitals:   04/20/18 0812  TempSrc: Oral  PainSc: 0-No pain         Complications: No apparent anesthesia complications

## 2018-04-20 NOTE — Anesthesia Preprocedure Evaluation (Signed)
Anesthesia Evaluation  Patient identified by MRN, date of birth, ID band Patient awake    Reviewed: Allergy & Precautions, NPO status , Patient's Chart, lab work & pertinent test results  Airway Mallampati: I  TM Distance: >3 FB Neck ROM: Full    Dental   Pulmonary sleep apnea , former smoker,  S/p Lobectomy for blebs many yrs ago in [place]. On 3l O2 continuously   Pulmonary exam normal        Cardiovascular hypertension, Pt. on medications Normal cardiovascular exam     Neuro/Psych    GI/Hepatic GERD  Medicated and Controlled,  Endo/Other    Renal/GU      Musculoskeletal   Abdominal   Peds  Hematology   Anesthesia Other Findings   Reproductive/Obstetrics                             Anesthesia Physical Anesthesia Plan  ASA: III  Anesthesia Plan: MAC   Post-op Pain Management:    Induction: Intravenous  PONV Risk Score and Plan: 1 and Treatment may vary due to age or medical condition  Airway Management Planned: Simple Face Mask  Additional Equipment:   Intra-op Plan:   Post-operative Plan:   Informed Consent: I have reviewed the patients History and Physical, chart, labs and discussed the procedure including the risks, benefits and alternatives for the proposed anesthesia with the patient or authorized representative who has indicated his/her understanding and acceptance.     Plan Discussed with: CRNA and Surgeon  Anesthesia Plan Comments:         Anesthesia Quick Evaluation

## 2018-04-20 NOTE — Op Note (Signed)
[facility] Patient Name: Andrew [name] Procedure Date : 04/20/2018 MRN: [id number] Attending MD: Lear Ng , MD Date of Birth: 1941/04/06 CSN: [id number] Age: 77 Admit Type: Outpatient Procedure:                Colonoscopy Indications:              Iron deficiency anemia Providers:                Lear Ng, MD, Cleda Daub, RN, Elspeth Cho Tech., Technician, Wyatt Haste Referring MD:             Lujean Amel, MD Medicines:                Propofol per Anesthesia, Monitored Anesthesia Care Complications:            No immediate complications. Estimated Blood Loss:     Estimated blood loss: none. Procedure:                Pre-Anesthesia Assessment:                           - Prior to the procedure, a History and Physical                            was performed, and patient medications and                            allergies were reviewed. The patient's tolerance of                            previous anesthesia was also reviewed. The risks                            and benefits of the procedure and the sedation                            options and risks were discussed with the patient.                            All questions were answered, and informed consent                            was obtained. Prior Anticoagulants: The patient has                            taken Eliquis (apixaban), last dose was 5 days                            prior to procedure. ASA Grade Assessment: III - A                            patient with severe systemic disease. After  reviewing the risks and benefits, the patient was                            deemed in satisfactory condition to undergo the                            procedure.                           - Prior to the procedure, a History and Physical                            was performed, and patient medications and   allergies were reviewed. The patient's tolerance of                            previous anesthesia was also reviewed. The risks                            and benefits of the procedure and the sedation                            options and risks were discussed with the patient.                            All questions were answered, and informed consent                            was obtained. Prior Anticoagulants: The patient has                            taken Eliquis (apixaban), last dose was 5 days                            prior to procedure. ASA Grade Assessment: III - A                            patient with severe systemic disease. After                            reviewing the risks and benefits, the patient was                            deemed in satisfactory condition to undergo the                            procedure.                           After obtaining informed consent, the colonoscope                            was passed under direct vision. Throughout the  procedure, the patient's blood pressure, pulse, and                            oxygen saturations were monitored continuously. The                            EC-[id number] ([id number]) scope was introduced through                            the anus and advanced to the the cecum, identified                            by appendiceal orifice and ileocecal valve. The                            colonoscopy was performed with difficulty due to                            significant looping and fair prep. Successful                            completion of the procedure was aided by                            straightening and shortening the scope to obtain                            bowel loop reduction and lavage. The patient                            tolerated the procedure well. The quality of the                            bowel preparation was fair and fair but repeated                             irrigation led to a good and adequate prep. The                            ileocecal valve, appendiceal orifice, and rectum                            were photographed. Scope In: 11:03:50 AM Scope Out: 11:22:59 AM Scope Withdrawal Time: 0 hours 12 minutes 39 seconds  Total Procedure Duration: 0 hours 19 minutes 9 seconds  Findings:      The perianal and digital rectal examinations were normal.      A 7 mm polyp was found in the sigmoid colon. The polyp was sessile. The       polyp was removed with a hot snare. Resection and retrieval were       complete. Estimated blood loss: none.      A 20 mm polyp was found in the rectum. The polyp was pedunculated. The       polyp was removed with  a hot snare. Resection and retrieval were       complete. Estimated blood loss: none.      A few small-mouthed diverticula were found in the sigmoid colon.      Internal hemorrhoids were found during retroflexion. The hemorrhoids       were medium-sized and Grade I (internal hemorrhoids that do not       prolapse). Impression:               - Preparation of the colon was fair.                           - One 7 mm polyp in the sigmoid colon, removed with                            a hot snare. Resected and retrieved.                           - One 20 mm polyp in the rectum, removed with a hot                            snare. Resected and retrieved.                           - Diverticulosis in the sigmoid colon.                           - Internal hemorrhoids. Recommendation:           - Await pathology results.                           - Resume Eliquis (apixaban) at prior dose in 5                            days. Refer to managing physician for further                            adjustment of therapy.                           - Repeat colonoscopy for surveillance based on                            pathology results.                           - High fiber diet. Procedure Code(s):        ---  Professional ---                           [phone], Colonoscopy, flexible; with removal of                            tumor(s), polyp(s), or other lesion(s) by snare  technique Diagnosis Code(s):        --- Professional ---                           D50.9, Iron deficiency anemia, unspecified                           D12.5, Benign neoplasm of sigmoid colon                           K62.1, Rectal polyp                           K64.0, First degree hemorrhoids                           K57.30, Diverticulosis of large intestine without                            perforation or abscess without bleeding CPT copyright 2017 American [employer]. All rights reserved. The codes documented in this report are preliminary and upon coder review may  be revised to meet current compliance requirements. Lear Ng, MD 04/20/2018 11:45:04 AM This report has been signed electronically. Number of Addenda: 0

## 2018-04-20 NOTE — Interval H&P Note (Signed)
History and Physical Interval Note:  04/20/2018 10:39 AM  Andrew [name]  has presented today for surgery, with the diagnosis of Iron deficiency anemia  The various methods of treatment have been discussed with the patient and family. After consideration of risks, benefits and other options for treatment, the patient has consented to  Procedure(s): COLONOSCOPY WITH PROPOFOL (N/A) ESOPHAGOGASTRODUODENOSCOPY (EGD) WITH PROPOFOL (N/A) as a surgical intervention .  The patient's history has been reviewed, patient examined, no change in status, stable for surgery.  I have reviewed the patient's chart and labs.  Questions were answered to the patient's satisfaction.     [place] C.

## 2018-04-20 NOTE — Anesthesia Postprocedure Evaluation (Signed)
Anesthesia Post Note  Patient: Andrew [name]  Procedure(s) Performed: COLONOSCOPY WITH PROPOFOL (N/A ) ESOPHAGOGASTRODUODENOSCOPY (EGD) WITH PROPOFOL (N/A ) HOT HEMOSTASIS (ARGON PLASMA COAGULATION/BICAP) (N/A ) POLYPECTOMY     Patient location during evaluation: PACU Anesthesia Type: MAC Level of consciousness: awake and alert Pain management: pain level controlled Vital Signs Assessment: post-procedure vital signs reviewed and stable Respiratory status: spontaneous breathing, nonlabored ventilation, respiratory function stable and patient connected to nasal cannula oxygen Cardiovascular status: stable and blood pressure returned to baseline Postop Assessment: no apparent nausea or vomiting Anesthetic complications: no    Last Vitals:  Vitals:   04/20/18 1131 04/20/18 1141  BP: (!) 102/53 119/64  Pulse: 70 66  Resp: 14 13  Temp: 36.5 C   SpO2: 100% 100%    Last Pain:  Vitals:   04/20/18 1141  TempSrc:   PainSc: 0-No pain                 [name] DAVID

## 2018-04-21 ENCOUNTER — Encounter (HOSPITAL_COMMUNITY): Payer: Self-pay | Admitting: Gastroenterology

## 2018-04-23 ENCOUNTER — Ambulatory Visit: Payer: Medicare Other | Attending: Pulmonary Disease | Admitting: Physical Therapy

## 2018-04-23 ENCOUNTER — Encounter: Payer: Self-pay | Admitting: Physical Therapy

## 2018-04-23 VITALS — HR 72

## 2018-04-23 DIAGNOSIS — R2681 Unsteadiness on feet: Secondary | ICD-10-CM | POA: Insufficient documentation

## 2018-04-23 DIAGNOSIS — R2689 Other abnormalities of gait and mobility: Secondary | ICD-10-CM | POA: Insufficient documentation

## 2018-04-23 DIAGNOSIS — M6281 Muscle weakness (generalized): Secondary | ICD-10-CM | POA: Insufficient documentation

## 2018-04-23 NOTE — Patient Instructions (Signed)
Standing Marching   Using a chair if necessary, march in place. Repeat 10 times. Do 1 -2 sessions per day.  http://gt2.exer.us/344   Copyright  VHI. All rights reserved.   Hip Backward Kick   Using a chair for balance, keep legs shoulder width apart and toes pointed for- ward. Slowly extend one leg back, keeping knee straight. Do not lean forward. Repeat with other leg. Repeat 10 times. Do 1 -2 sessions per day.  http://gt2.exer.us/340     Hip Side Kick   Holding a chair for balance, keep legs shoulder width apart and toes pointed forward. Swing a leg out to side, keeping knee straight. Do not lean. Repeat using other leg. Repeat 10 times. Do 1-2 sessions per day.  http://gt2.exer.us/342   ALSO DO FORWARD KICK - 10 reps each leg         Standing On One Leg Without Support .  Stand on one leg in neutral spine without support. Hold 10 seconds. Repeat on other leg. Do 2  repetitions, 1 sets.  http://bt.exer.us/36   Copyright  VHI. All rights reserved.    Side-Stepping   Walk to left side with eyes open. Take even steps, leading with same foot. Make sure each foot lifts off the floor. Repeat in opposite direction. Repeat for 3-4 reps along counter.

## 2018-04-23 NOTE — Therapy (Signed)
[facility] [address] [place], [place], [address] Phone: [phone]   Fax:  [phone]  Physical Therapy Treatment  Patient Details  Name: Andrew [name] MRN: [id number] Date of Birth: 11-Nov-1941 Referring Provider: Dr. Teressa Lower    Encounter Date: 04/23/2018  PT End of Session - 04/23/18 1237    Visit Number  2    Number of Visits  9    Date for PT Re-Evaluation  05/07/18    Authorization Type  Medicare    Authorization Time Period  04-06-18 - 07-05-18    PT Start Time  0934    PT Stop Time  1020    PT Time Calculation (min)  46 min       Past Medical History:  Diagnosis Date  . Anemia   . BiPAP (biphasic positive airway pressure) dependence    Pt denies history of OSA  . BPH (benign prostatic hyperplasia)   . Cancer ([place])    skin  . COPD (chronic obstructive pulmonary disease) ([place])   . Dysphagia   . Emphysema lung ([place])   . GERD (gastroesophageal reflux disease)    " Silent reflux"  . Hearing loss    right ear  . History of hiatal hernia   . HLD (hyperlipidemia)   . Hypertension   . Oxygen dependent    2-3 liters  . Oxygen dependent   . Pneumonia   . Pulmonary hypertension ([place])   . Sleep apnea    wears cpap    Past Surgical History:  Procedure Laterality Date  . CARDIAC CATHETERIZATION    . CATARACT EXTRACTION W/ INTRAOCULAR LENS  IMPLANT, BILATERAL    . COLONOSCOPY WITH PROPOFOL N/A 04/20/2018   Procedure: COLONOSCOPY WITH PROPOFOL;  Surgeon: Wilford Corner, MD;  Location: [facility];  Service: Endoscopy;  Laterality: N/A;  . ESOPHAGOGASTRODUODENOSCOPY (EGD) WITH PROPOFOL N/A 08/23/2016   Procedure: ESOPHAGOGASTRODUODENOSCOPY (EGD) WITH PROPOFOL;  Surgeon: Wilford Corner, MD;  Location: [facility] ENDOSCOPY;  Service: Endoscopy;  Laterality: N/A;  . ESOPHAGOGASTRODUODENOSCOPY (EGD) WITH PROPOFOL N/A 04/20/2018   Procedure: ESOPHAGOGASTRODUODENOSCOPY (EGD) WITH PROPOFOL;  Surgeon: Wilford Corner, MD;  Location: [facility];  Service: Endoscopy;  Laterality: N/A;  . HOT HEMOSTASIS N/A 04/20/2018   Procedure: HOT HEMOSTASIS (ARGON PLASMA COAGULATION/BICAP);  Surgeon: Wilford Corner, MD;  Location: [facility];  Service: Endoscopy;  Laterality: N/A;  . LUNG SURGERY    . POLYPECTOMY  04/20/2018   Procedure: POLYPECTOMY;  Surgeon: Wilford Corner, MD;  Location[facility] ENDOSCOPY;  Service: Endoscopy;;  . TONSILLECTOMY      Vitals:   04/23/18 0954  Pulse: 72  SpO2: (!) 85%   Above reading recorded after standing side and forward kicks Subjective Assessment - 04/23/18 1225    Subjective  Pt states he has had some issues with breathing - last night and this morning -- is on 2L O2; states he is feeling a little better today    Pertinent History  Chronic hypoxemic respiratory failure, pulmonary HTN, centrilobular emphysema, paroxysmal atrial fibrillation:  COPD    Patient Stated Goals  improve strength and endurance to be able to participate in pulmonary rehab and then to return to [facility] for exercise    Currently in Pain?  No/denies                       [facility] Adult PT Treatment/Exercise - 04/23/18 1005      Transfers   Transfers  Sit to Stand  Number of Reps  10 reps    Comments  no UE support - performed from mat      Ambulation/Gait   Ambulation/Gait  Yes    Ambulation Distance (Feet)  230 Feet 2 reps - approx. 2" rest between laps; O2 rate 86% after 1st    Assistive device  None    Gait Pattern  Step-through pattern    Ambulation Surface  Level;Indoor    Stairs  Yes    Stairs Assistance  5: Supervision    Stair Management Technique  Two rails;Alternating pattern;Forwards    Number of Stairs  12 x 2 reps with rest between reps     Height of Stairs  6      Neuro Re-ed    Neuro Re-ed Details   Pt performed standing hip flexion, extension and abduction 10 reps each leg;  pt required seated rest break after hip abduction & flexion - O2 rate 88% - pt  rested until O2 incr. to 94%      TherEx:   heel raises in standing x 10 reps with minimal UE support  Pt performed SLS on each leg - 10 sec hold with UE support prn  Gait training above - 4 laps total   Pt performed marching in place - 10 reps each leg with minimal UE support on chair back 2nd rep - added marching in place with lifting 1 UE overhead simultaneously with lifting leg - 5 reps each leg     PT Education - 04/23/18 1237    Education Details  pt instructed in standing balance exercises for HEP    Person(s) Educated  Patient    Methods  Explanation;Demonstration;Handout    Comprehension  Verbalized understanding;Returned demonstration          PT Long Term Goals - 04/07/18 2311      PT LONG TERM GOAL #1   Title  Pt will amb. 4" nonstop to demo improved activity tolerance.      Baseline  2" 45 secs nonstop     Time  4    Period  Weeks    Status  New    Target Date  05/07/18      PT LONG TERM GOAL #2   Title  Pt will perform 5 times sit to stand in </= 8 secs to demo increased strength and endurance.    Baseline  10.13 secs    Time  4    Period  Weeks    Status  New    Target Date  05/07/18      PT LONG TERM GOAL #3   Title  Pt will negotiate 12 step with 1 rail using a step over step sequence with PRE rating </= 4/10 intensity.    Time  4    Period  Weeks    Status  New    Target Date  05/07/18      PT LONG TERM GOAL #4   Title  Independent in HEP for strengthening and balance exercises.     Time  4    Period  Weeks    Status  New    Target Date  05/07/18      PT LONG TERM GOAL #5   Title  Pt will be able to participate in pulmonary rehab upon D/C from PT.    Time  4    Period  Weeks    Status  New    Target Date  05/07/18  Plan - 04/23/18 0938    Clinical Impression Statement  Pt's O2 rate decreases quickly with standing dynamic exercises but pt able to take short seated rest break and O2 rate increases.  Instructed pt to  increase O2 from 2L to 3L after he performed step negotiation (2 reps 12 steps) on 2L and had moderate dyspnea with O2 rate recorded on pulse ox as 77%.  Pt stated his hand was cold so questioned accuracy of this reading.      Rehab Potential  Good    PT Frequency  2x / week    PT Duration  4 weeks    PT Treatment/Interventions  ADLs/Self Care Home Management;Therapeutic exercise;Therapeutic activities;Gait training;Stair training;Balance training;Neuromuscular re-education;Patient/family education;Energy conservation    PT Next Visit Plan  MONITOR O2 saturation rate (have him increase to 3L)  -- endurance activities, functional strengthening - sit to stand, step training, balance activities, ambulation - try Sci[employer] for 1"?    PT Home Exercise Plan  gave balance exercises on 04-23-18 - kicks, SLS, sidestepping and marching    Consulted and Agree with Plan of Care  Patient       Patient will benefit from skilled therapeutic intervention in order to improve the following deficits and impairments:  Difficulty walking, Decreased balance, Decreased activity tolerance, Cardiopulmonary status limiting activity, Decreased endurance  Visit Diagnosis: Muscle weakness (generalized)  Other abnormalities of gait and mobility  Unsteadiness on feet     Problem List Patient Active Problem List   Diagnosis Date Noted  . Iron deficiency anemia, unspecified 04/20/2018  . PVCs (premature ventricular contractions) 02/02/2018  . Pressure injury of skin 01/18/2018  . Influenza-like illness 01/17/2018  . History of pneumonia 12/16/2017  . OSA on CPAP 09/22/2017  . OSA and COPD overlap syndrome ([place]) 09/11/2017  . Paroxysmal atrial fibrillation ([facility])   . Malnutrition of moderate degree 09/10/2017  . Sepsis ([place]) 09/09/2017  . Diarrhea 09/09/2017  . Protein-calorie malnutrition, severe 12/09/2016  . Dysphagia 08/23/2016  . Normocytic anemia 06/28/2016  . Pressure sore on buttocks 06/28/2016  . Pulmonary  hypertension ([place]) 02/20/2016  . GERD (gastroesophageal reflux disease) 02/20/2016  . COPD with acute exacerbation ([place]) 02/13/2016  . Acute respiratory failure with hypoxia ([place]) 02/12/2016  . Hypertension 02/11/2016  . Hyperlipidemia 02/11/2016  . CAP (community acquired pneumonia)   . BPH (benign prostatic hyperplasia) 12/29/2015  . Chronic respiratory failure with hypoxia ([place]) 12/28/2015  . COPD with emphysema ([place]) 10/02/2015  . Chronic hypoxemic respiratory failure ([place]) 10/02/2015  . History of pulmonary hypertension 10/02/2015    Alda Lea, PT 04/23/2018, 12:52 PM  [facility] [address] [place], [place], [address] Phone: [phone]   Fax:  [phone]  Name: Andrew [name] MRN: [id number] Date of Birth: 04-01-1941

## 2018-04-24 ENCOUNTER — Encounter: Payer: Self-pay | Admitting: Physical Therapy

## 2018-04-24 ENCOUNTER — Ambulatory Visit: Payer: Medicare Other | Admitting: Physical Therapy

## 2018-04-24 VITALS — BP 124/72 | HR 70

## 2018-04-24 DIAGNOSIS — M6281 Muscle weakness (generalized): Secondary | ICD-10-CM

## 2018-04-24 DIAGNOSIS — R2681 Unsteadiness on feet: Secondary | ICD-10-CM

## 2018-04-24 DIAGNOSIS — R2689 Other abnormalities of gait and mobility: Secondary | ICD-10-CM

## 2018-04-24 NOTE — Therapy (Signed)
[facility] [address] [place], [place], [address] Phone: [phone]   Fax:  [phone]  Physical Therapy Treatment  Patient Details  Name: Andrew [name] MRN: [id number] Date of Birth: March 02, 1941 Referring Provider: Dr. Teressa Lower    Encounter Date: 04/24/2018  PT End of Session - 04/24/18 0859    Visit Number  3    Number of Visits  9    Date for PT Re-Evaluation  05/07/18    Authorization Type  Medicare    Authorization Time Period  04-06-18 - 07-05-18    PT Start Time  0846    PT Stop Time  0928    PT Time Calculation (min)  42 min    Equipment Utilized During Treatment  Oxygen pt raised oxygen from 2L to 3 L prior to exercise    Activity Tolerance  Treatment limited secondary to medical complications (Comment) NL89 dropping below 85% (low of 81%)    Behavior During Therapy  [facility] for tasks assessed/performed       Past Medical History:  Diagnosis Date  . Anemia   . BiPAP (biphasic positive airway pressure) dependence    Pt denies history of OSA  . BPH (benign prostatic hyperplasia)   . Cancer ([place])    skin  . COPD (chronic obstructive pulmonary disease) ([place])   . Dysphagia   . Emphysema lung ([place])   . GERD (gastroesophageal reflux disease)    " Silent reflux"  . Hearing loss    right ear  . History of hiatal hernia   . HLD (hyperlipidemia)   . Hypertension   . Oxygen dependent    2-3 liters  . Oxygen dependent   . Pneumonia   . Pulmonary hypertension ([place])   . Sleep apnea    wears cpap    Past Surgical History:  Procedure Laterality Date  . CARDIAC CATHETERIZATION    . CATARACT EXTRACTION W/ INTRAOCULAR LENS  IMPLANT, BILATERAL    . COLONOSCOPY WITH PROPOFOL N/A 04/20/2018   Procedure: COLONOSCOPY WITH PROPOFOL;  Surgeon: Wilford Corner, MD;  Location: [facility];  Service: Endoscopy;  Laterality: N/A;  . ESOPHAGOGASTRODUODENOSCOPY (EGD) WITH PROPOFOL N/A 08/23/2016   Procedure:  ESOPHAGOGASTRODUODENOSCOPY (EGD) WITH PROPOFOL;  Surgeon: Wilford Corner, MD;  Location: [facility] ENDOSCOPY;  Service: Endoscopy;  Laterality: N/A;  . ESOPHAGOGASTRODUODENOSCOPY (EGD) WITH PROPOFOL N/A 04/20/2018   Procedure: ESOPHAGOGASTRODUODENOSCOPY (EGD) WITH PROPOFOL;  Surgeon: Wilford Corner, MD;  Location: [facility];  Service: Endoscopy;  Laterality: N/A;  . HOT HEMOSTASIS N/A 04/20/2018   Procedure: HOT HEMOSTASIS (ARGON PLASMA COAGULATION/BICAP);  Surgeon: Wilford Corner, MD;  Location: [facility];  Service: Endoscopy;  Laterality: N/A;  . LUNG SURGERY    . POLYPECTOMY  04/20/2018   Procedure: POLYPECTOMY;  Surgeon: Wilford Corner, MD;  Location: [facility];  Service: Endoscopy;;  . TONSILLECTOMY      Vitals:   04/24/18 0848 04/24/18 0925 04/24/18 0927  BP: 136/74 124/72   Pulse:  70   SpO2: 93% (!) 86% 91%  SaO2 monitored throughout session and activities: above is just a sample of measures with his range 81-94% on 3L O2.  Subjective Assessment - 04/24/18 0849    Subjective  Did well after session yesterday. Had some shortness of breath last night--but not worse than other days recently.     Pertinent History  Chronic hypoxemic respiratory failure, pulmonary HTN, centrilobular emphysema, paroxysmal atrial fibrillation:  COPD    Currently in Pain?  No/denies  [facility] Adult PT Treatment/Exercise - 04/24/18 0858      Exercises   Exercises  Knee/Hip      Knee/Hip Exercises: Aerobic   Nustep  Nustep L4 ~70 spm x 3.5 minutes       [facility] Adult PT Treatment/Exercise - 04/24/18 0858      Ambulation/Gait   Ambulation/Gait Assistance  5: Supervision    Ambulation/Gait Assistance Details  to monitor exercise tolerance    Ambulation Distance (Feet)  360 Feet    Assistive device  None    Gait Pattern  Step-through pattern    Ambulation Surface  Indoor      Exercises   Exercises  Knee/Hip      Knee/Hip Exercises: Aerobic   Nustep   Nustep L4 ~70 spm;       Knee/Hip Exercises: Standing   Heel Raises  Both;1 set;15 reps;3 seconds    Hip Flexion  AROM;Both;1 set;10 reps;Knee bent bil UE support    Hip Abduction  Stengthening;Both;1 set;10 reps;Knee straight bil UE support; vc/tactile cues for approp leg alignment    Abduction Limitations  pt did better with taking his leg slightly posterior as abducting to avoid use of hip flexor    Forward Step Up  Both;2 sets;10 reps;Hand Hold: 2;Hand Hold: 1;Step Height: 4"    Forward Step Up Limitations  followed by backwards step up onto 4" step bil UE support 10 reps x 2             PT Education - 04/24/18 1742    Education Details  rationale for pursed lip breathing    Person(s) Educated  Patient    Methods  Explanation    Comprehension  Verbalized understanding;Returned demonstration          PT Long Term Goals - 04/07/18 2311      PT LONG TERM GOAL #1   Title  Pt will amb. 4" nonstop to demo improved activity tolerance.      Baseline  2" 45 secs nonstop     Time  4    Period  Weeks    Status  New    Target Date  05/07/18      PT LONG TERM GOAL #2   Title  Pt will perform 5 times sit to stand in </= 8 secs to demo increased strength and endurance.    Baseline  10.13 secs    Time  4    Period  Weeks    Status  New    Target Date  05/07/18      PT LONG TERM GOAL #3   Title  Pt will negotiate 12 step with 1 rail using a step over step sequence with PRE rating </= 4/10 intensity.    Time  4    Period  Weeks    Status  New    Target Date  05/07/18      PT LONG TERM GOAL #4   Title  Independent in HEP for strengthening and balance exercises.     Time  4    Period  Weeks    Status  New    Target Date  05/07/18      PT LONG TERM GOAL #5   Title  Pt will be able to participate in pulmonary rehab upon D/C from PT.    Time  4    Period  Weeks    Status  New    Target Date  05/07/18  Plan - 04/24/18 1743    Clinical Impression  Statement  Patient closely monitored for SaO2 levels, HR, BP, and signs of distress while participating in PT session. He reports he had years ago been told by someone that he should keep his SaO2 >88%. Patient desaturated each time with activity and 80% of the time below 88% but above 81%. Pt remains calm, does a good job of not talking and using pursed lip breathing to bring Sa)2 up above 90%. Currently activity is limited by his pulmonary function.     Rehab Potential  Good    PT Frequency  2x / week    PT Duration  4 weeks    PT Treatment/Interventions  ADLs/Self Care Home Management;Therapeutic exercise;Therapeutic activities;Gait training;Stair training;Balance training;Neuromuscular re-education;Patient/family education;Energy conservation    PT Next Visit Plan  MONITOR O2 saturation rate (have him increase to 3L)  -- endurance activities, functional strengthening - sit to stand, step training, balance activities, ambulation - try Sci[employer] for 1"?    PT Home Exercise Plan  gave balance exercises on 04-23-18 - kicks, SLS, sidestepping and marching    Consulted and Agree with Plan of Care  Patient       Patient will benefit from skilled therapeutic intervention in order to improve the following deficits and impairments:  Difficulty walking, Decreased balance, Decreased activity tolerance, Cardiopulmonary status limiting activity, Decreased endurance  Visit Diagnosis: Muscle weakness (generalized)  Unsteadiness on feet  Other abnormalities of gait and mobility     Problem List Patient Active Problem List   Diagnosis Date Noted  . Iron deficiency anemia, unspecified 04/20/2018  . PVCs (premature ventricular contractions) 02/02/2018  . Pressure injury of skin 01/18/2018  . Influenza-like illness 01/17/2018  . History of pneumonia 12/16/2017  . OSA on CPAP 09/22/2017  . OSA and COPD overlap syndrome ([place]) 09/11/2017  . Paroxysmal atrial fibrillation ([facility])   . Malnutrition of moderate  degree 09/10/2017  . Sepsis ([place]) 09/09/2017  . Diarrhea 09/09/2017  . Protein-calorie malnutrition, severe 12/09/2016  . Dysphagia 08/23/2016  . Normocytic anemia 06/28/2016  . Pressure sore on buttocks 06/28/2016  . Pulmonary hypertension ([place]) 02/20/2016  . GERD (gastroesophageal reflux disease) 02/20/2016  . COPD with acute exacerbation ([place]) 02/13/2016  . Acute respiratory failure with hypoxia ([place]) 02/12/2016  . Hypertension 02/11/2016  . Hyperlipidemia 02/11/2016  . CAP (community acquired pneumonia)   . BPH (benign prostatic hyperplasia) 12/29/2015  . Chronic respiratory failure with hypoxia ([place]) 12/28/2015  . COPD with emphysema ([place]) 10/02/2015  . Chronic hypoxemic respiratory failure ([place]) 10/02/2015  . History of pulmonary hypertension 10/02/2015    Rexanne Mano, PT 04/24/2018, 5:56 PM  [facility] [address] [place], [place], [address] Phone: [phone]   Fax:  [phone]  Name: Taegen Delker MRN: [id number] Date of Birth: Mar 01, 1941

## 2018-04-25 ENCOUNTER — Encounter (HOSPITAL_COMMUNITY): Payer: Self-pay | Admitting: Emergency Medicine

## 2018-04-25 ENCOUNTER — Emergency Department (HOSPITAL_COMMUNITY): Payer: Medicare Other

## 2018-04-25 ENCOUNTER — Inpatient Hospital Stay (HOSPITAL_COMMUNITY)
Admission: EM | Admit: 2018-04-25 | Discharge: 2018-04-30 | DRG: 871 | Disposition: A | Discharge status: Home / Self Care | Payer: Medicare Other | Attending: Internal Medicine | Admitting: Internal Medicine

## 2018-04-25 DIAGNOSIS — Z9989 Dependence on other enabling machines and devices: Secondary | ICD-10-CM

## 2018-04-25 DIAGNOSIS — G4733 Obstructive sleep apnea (adult) (pediatric): Secondary | ICD-10-CM | POA: Diagnosis present

## 2018-04-25 DIAGNOSIS — J9611 Chronic respiratory failure with hypoxia: Secondary | ICD-10-CM | POA: Diagnosis present

## 2018-04-25 DIAGNOSIS — E041 Nontoxic single thyroid nodule: Secondary | ICD-10-CM | POA: Diagnosis present

## 2018-04-25 DIAGNOSIS — Z87891 Personal history of nicotine dependence: Secondary | ICD-10-CM

## 2018-04-25 DIAGNOSIS — M351 Other overlap syndromes: Secondary | ICD-10-CM | POA: Diagnosis present

## 2018-04-25 DIAGNOSIS — J439 Emphysema, unspecified: Secondary | ICD-10-CM | POA: Diagnosis present

## 2018-04-25 DIAGNOSIS — E785 Hyperlipidemia, unspecified: Secondary | ICD-10-CM | POA: Diagnosis present

## 2018-04-25 DIAGNOSIS — J9811 Atelectasis: Secondary | ICD-10-CM | POA: Diagnosis present

## 2018-04-25 DIAGNOSIS — I10 Essential (primary) hypertension: Secondary | ICD-10-CM | POA: Diagnosis present

## 2018-04-25 DIAGNOSIS — R000 Tachycardia, unspecified: Secondary | ICD-10-CM | POA: Diagnosis not present

## 2018-04-25 DIAGNOSIS — J189 Pneumonia, unspecified organism: Secondary | ICD-10-CM | POA: Diagnosis not present

## 2018-04-25 DIAGNOSIS — R0602 Shortness of breath: Secondary | ICD-10-CM | POA: Diagnosis not present

## 2018-04-25 DIAGNOSIS — D638 Anemia in other chronic diseases classified elsewhere: Secondary | ICD-10-CM | POA: Diagnosis present

## 2018-04-25 DIAGNOSIS — K219 Gastro-esophageal reflux disease without esophagitis: Secondary | ICD-10-CM | POA: Diagnosis present

## 2018-04-25 DIAGNOSIS — R918 Other nonspecific abnormal finding of lung field: Secondary | ICD-10-CM | POA: Diagnosis not present

## 2018-04-25 DIAGNOSIS — J441 Chronic obstructive pulmonary disease with (acute) exacerbation: Secondary | ICD-10-CM | POA: Diagnosis not present

## 2018-04-25 DIAGNOSIS — R402413 Glasgow coma scale score 13-15, at hospital admission: Secondary | ICD-10-CM | POA: Diagnosis present

## 2018-04-25 DIAGNOSIS — A419 Sepsis, unspecified organism: Secondary | ICD-10-CM | POA: Diagnosis not present

## 2018-04-25 DIAGNOSIS — Z9981 Dependence on supplemental oxygen: Secondary | ICD-10-CM

## 2018-04-25 DIAGNOSIS — H9191 Unspecified hearing loss, right ear: Secondary | ICD-10-CM | POA: Diagnosis present

## 2018-04-25 DIAGNOSIS — Z79899 Other long term (current) drug therapy: Secondary | ICD-10-CM

## 2018-04-25 DIAGNOSIS — I2721 Secondary pulmonary arterial hypertension: Secondary | ICD-10-CM | POA: Diagnosis present

## 2018-04-25 DIAGNOSIS — Y95 Nosocomial condition: Secondary | ICD-10-CM | POA: Diagnosis present

## 2018-04-25 DIAGNOSIS — Z8679 Personal history of other diseases of the circulatory system: Secondary | ICD-10-CM

## 2018-04-25 DIAGNOSIS — Z7952 Long term (current) use of systemic steroids: Secondary | ICD-10-CM

## 2018-04-25 DIAGNOSIS — Z7901 Long term (current) use of anticoagulants: Secondary | ICD-10-CM

## 2018-04-25 DIAGNOSIS — J411 Mucopurulent chronic bronchitis: Secondary | ICD-10-CM | POA: Diagnosis present

## 2018-04-25 DIAGNOSIS — R509 Fever, unspecified: Secondary | ICD-10-CM | POA: Diagnosis not present

## 2018-04-25 DIAGNOSIS — I272 Pulmonary hypertension, unspecified: Secondary | ICD-10-CM | POA: Diagnosis present

## 2018-04-25 DIAGNOSIS — J181 Lobar pneumonia, unspecified organism: Secondary | ICD-10-CM | POA: Diagnosis present

## 2018-04-25 DIAGNOSIS — Z7951 Long term (current) use of inhaled steroids: Secondary | ICD-10-CM

## 2018-04-25 DIAGNOSIS — N400 Enlarged prostate without lower urinary tract symptoms: Secondary | ICD-10-CM | POA: Diagnosis present

## 2018-04-25 DIAGNOSIS — G4700 Insomnia, unspecified: Secondary | ICD-10-CM | POA: Diagnosis present

## 2018-04-25 DIAGNOSIS — E876 Hypokalemia: Secondary | ICD-10-CM | POA: Diagnosis present

## 2018-04-25 DIAGNOSIS — I959 Hypotension, unspecified: Secondary | ICD-10-CM | POA: Diagnosis not present

## 2018-04-25 DIAGNOSIS — I480 Paroxysmal atrial fibrillation: Secondary | ICD-10-CM | POA: Diagnosis present

## 2018-04-25 LAB — COMPREHENSIVE METABOLIC PANEL
ALBUMIN: 3.6 g/dL (ref 3.5–5.0)
ALT: 18 U/L (ref 17–63)
AST: 16 U/L (ref 15–41)
Alkaline Phosphatase: 60 U/L (ref 38–126)
Anion gap: 9 (ref 5–15)
BUN: 16 mg/dL (ref 6–20)
CHLORIDE: 99 mmol/L — AB (ref 101–111)
CO2: 26 mmol/L (ref 22–32)
CREATININE: 0.98 mg/dL (ref 0.61–1.24)
Calcium: 8.9 mg/dL (ref 8.9–10.3)
GFR calc non Af Amer: >60 mL/min (ref >60)
GLUCOSE: 122 mg/dL — AB (ref 65–99)
Potassium: 3.4 mmol/L — ABNORMAL LOW (ref 3.5–5.1)
SODIUM: 134 mmol/L — AB (ref 135–145)
Total Bilirubin: 0.4 mg/dL (ref 0.3–1.2)
Total Protein: 6 g/dL — ABNORMAL LOW (ref 6.5–8.1)

## 2018-04-25 LAB — CBC WITH DIFFERENTIAL/PLATELET
Abs Immature Granulocytes: 0.1 10*3/uL (ref 0.0–0.1)
Basophils Absolute: 0 10*3/uL (ref 0.0–0.1)
Basophils Relative: 0 %
EOS PCT: 0 %
Eosinophils Absolute: 0.1 10*3/uL (ref 0.0–0.7)
HEMATOCRIT: 34.3 % — AB (ref 39.0–52.0)
HEMOGLOBIN: 10.7 g/dL — AB (ref 13.0–17.0)
IMMATURE GRANULOCYTES: 0 %
LYMPHS PCT: 8 %
Lymphs Abs: 1.3 10*3/uL (ref 0.7–4.0)
MCH: 28.2 pg (ref 26.0–34.0)
MCHC: 31.2 g/dL (ref 30.0–36.0)
MCV: 90.3 fL (ref 78.0–100.0)
MONOS PCT: 9 %
Monocytes Absolute: 1.3 10*3/uL — ABNORMAL HIGH (ref 0.1–1.0)
NEUTROS PCT: 83 %
Neutro Abs: 12.7 10*3/uL — ABNORMAL HIGH (ref 1.7–7.7)
Platelets: 273 10*3/uL (ref 150–400)
RBC: 3.8 MIL/uL — AB (ref 4.22–5.81)
RDW: 16.1 % — ABNORMAL HIGH (ref 11.5–15.5)
WBC: 15.4 10*3/uL — ABNORMAL HIGH (ref 4.0–10.5)

## 2018-04-25 LAB — I-STAT CG4 LACTIC ACID, ED: Lactic Acid, Venous: 1.55 mmol/L (ref 0.5–1.9)

## 2018-04-25 LAB — PROTIME-INR
INR: 1.1
Prothrombin Time: 14.1 seconds (ref 11.4–15.2)

## 2018-04-25 NOTE — ED Triage Notes (Signed)
Ems pt from home reports increased work of breath since Monday reports abdominal bloating and fever at home took 1000mg  tylenol @2130 . Pt reports home o2 2ltrs during the day and 3ltrs at night.

## 2018-04-26 ENCOUNTER — Other Ambulatory Visit: Payer: Self-pay

## 2018-04-26 ENCOUNTER — Encounter (HOSPITAL_COMMUNITY): Payer: Self-pay | Admitting: *Deleted

## 2018-04-26 DIAGNOSIS — I10 Essential (primary) hypertension: Secondary | ICD-10-CM | POA: Diagnosis not present

## 2018-04-26 DIAGNOSIS — J9811 Atelectasis: Secondary | ICD-10-CM | POA: Diagnosis present

## 2018-04-26 DIAGNOSIS — J441 Chronic obstructive pulmonary disease with (acute) exacerbation: Secondary | ICD-10-CM | POA: Diagnosis not present

## 2018-04-26 DIAGNOSIS — H9191 Unspecified hearing loss, right ear: Secondary | ICD-10-CM | POA: Diagnosis present

## 2018-04-26 DIAGNOSIS — E785 Hyperlipidemia, unspecified: Secondary | ICD-10-CM | POA: Diagnosis not present

## 2018-04-26 DIAGNOSIS — G4700 Insomnia, unspecified: Secondary | ICD-10-CM | POA: Diagnosis present

## 2018-04-26 DIAGNOSIS — E041 Nontoxic single thyroid nodule: Secondary | ICD-10-CM | POA: Diagnosis present

## 2018-04-26 DIAGNOSIS — I480 Paroxysmal atrial fibrillation: Secondary | ICD-10-CM | POA: Diagnosis not present

## 2018-04-26 DIAGNOSIS — A419 Sepsis, unspecified organism: Secondary | ICD-10-CM | POA: Diagnosis not present

## 2018-04-26 DIAGNOSIS — Z87891 Personal history of nicotine dependence: Secondary | ICD-10-CM | POA: Diagnosis not present

## 2018-04-26 DIAGNOSIS — Y95 Nosocomial condition: Secondary | ICD-10-CM | POA: Diagnosis present

## 2018-04-26 DIAGNOSIS — K219 Gastro-esophageal reflux disease without esophagitis: Secondary | ICD-10-CM | POA: Diagnosis present

## 2018-04-26 DIAGNOSIS — D638 Anemia in other chronic diseases classified elsewhere: Secondary | ICD-10-CM | POA: Diagnosis present

## 2018-04-26 DIAGNOSIS — J9611 Chronic respiratory failure with hypoxia: Secondary | ICD-10-CM | POA: Diagnosis not present

## 2018-04-26 DIAGNOSIS — R402413 Glasgow coma scale score 13-15, at hospital admission: Secondary | ICD-10-CM | POA: Diagnosis present

## 2018-04-26 DIAGNOSIS — E876 Hypokalemia: Secondary | ICD-10-CM | POA: Diagnosis present

## 2018-04-26 DIAGNOSIS — I272 Pulmonary hypertension, unspecified: Secondary | ICD-10-CM | POA: Diagnosis present

## 2018-04-26 DIAGNOSIS — J189 Pneumonia, unspecified organism: Secondary | ICD-10-CM | POA: Diagnosis not present

## 2018-04-26 DIAGNOSIS — I2721 Secondary pulmonary arterial hypertension: Secondary | ICD-10-CM | POA: Diagnosis present

## 2018-04-26 DIAGNOSIS — J411 Mucopurulent chronic bronchitis: Secondary | ICD-10-CM | POA: Diagnosis present

## 2018-04-26 DIAGNOSIS — Z7901 Long term (current) use of anticoagulants: Secondary | ICD-10-CM | POA: Diagnosis not present

## 2018-04-26 DIAGNOSIS — R0602 Shortness of breath: Secondary | ICD-10-CM | POA: Diagnosis not present

## 2018-04-26 DIAGNOSIS — J449 Chronic obstructive pulmonary disease, unspecified: Secondary | ICD-10-CM | POA: Diagnosis not present

## 2018-04-26 DIAGNOSIS — N400 Enlarged prostate without lower urinary tract symptoms: Secondary | ICD-10-CM | POA: Diagnosis present

## 2018-04-26 DIAGNOSIS — Z9981 Dependence on supplemental oxygen: Secondary | ICD-10-CM | POA: Diagnosis not present

## 2018-04-26 DIAGNOSIS — M351 Other overlap syndromes: Secondary | ICD-10-CM | POA: Diagnosis present

## 2018-04-26 DIAGNOSIS — J181 Lobar pneumonia, unspecified organism: Secondary | ICD-10-CM | POA: Diagnosis not present

## 2018-04-26 DIAGNOSIS — G4733 Obstructive sleep apnea (adult) (pediatric): Secondary | ICD-10-CM | POA: Diagnosis not present

## 2018-04-26 DIAGNOSIS — J439 Emphysema, unspecified: Secondary | ICD-10-CM | POA: Diagnosis present

## 2018-04-26 DIAGNOSIS — Z8679 Personal history of other diseases of the circulatory system: Secondary | ICD-10-CM | POA: Diagnosis not present

## 2018-04-26 LAB — BASIC METABOLIC PANEL
Anion gap: 13 (ref 5–15)
BUN: 14 mg/dL (ref 6–20)
CALCIUM: 9.4 mg/dL (ref 8.9–10.3)
CO2: 23 mmol/L (ref 22–32)
Chloride: 103 mmol/L (ref 101–111)
Creatinine, Ser: 0.94 mg/dL (ref 0.61–1.24)
GFR calc non Af Amer: >60 mL/min (ref >60)
Glucose, Bld: 136 mg/dL — ABNORMAL HIGH (ref 65–99)
Potassium: 4.2 mmol/L (ref 3.5–5.1)
Sodium: 139 mmol/L (ref 135–145)

## 2018-04-26 LAB — URINALYSIS, ROUTINE W REFLEX MICROSCOPIC
BILIRUBIN URINE: NEGATIVE
Glucose, UA: NEGATIVE mg/dL
Hgb urine dipstick: NEGATIVE
Ketones, ur: 5 mg/dL — AB
LEUKOCYTES UA: NEGATIVE
Nitrite: NEGATIVE
PH: 6 (ref 5.0–8.0)
Protein, ur: NEGATIVE mg/dL
Specific Gravity, Urine: 1.008 (ref 1.005–1.030)

## 2018-04-26 LAB — CBC
HCT: 36.2 % — ABNORMAL LOW (ref 39.0–52.0)
Hemoglobin: 11.1 g/dL — ABNORMAL LOW (ref 13.0–17.0)
MCH: 28.5 pg (ref 26.0–34.0)
MCHC: 30.7 g/dL (ref 30.0–36.0)
MCV: 93.1 fL (ref 78.0–100.0)
PLATELETS: 266 10*3/uL (ref 150–400)
RBC: 3.89 MIL/uL — AB (ref 4.22–5.81)
RDW: 16.5 % — AB (ref 11.5–15.5)
WBC: 17.7 10*3/uL — ABNORMAL HIGH (ref 4.0–10.5)

## 2018-04-26 LAB — PHOSPHORUS: Phosphorus: 3.8 mg/dL (ref 2.5–4.6)

## 2018-04-26 LAB — I-STAT CG4 LACTIC ACID, ED: LACTIC ACID, VENOUS: 1 mmol/L (ref 0.5–1.9)

## 2018-04-26 LAB — MAGNESIUM: MAGNESIUM: 1.9 mg/dL (ref 1.7–2.4)

## 2018-04-26 LAB — INFLUENZA PANEL BY PCR (TYPE A & B)
INFLAPCR: NEGATIVE
INFLBPCR: NEGATIVE

## 2018-04-26 MED ORDER — ACETAMINOPHEN 325 MG PO TABS: 650.0000 mg | ORAL_TABLET | Freq: Four times a day (QID) | ORAL | Status: DC | PRN

## 2018-04-26 MED ORDER — FINASTERIDE 5 MG PO TABS
5.0000 mg | ORAL_TABLET | Freq: Every day | ORAL | Status: DC
  Administered 2018-04-26 – 2018-04-29 (×4): 5 mg via ORAL
  Filled 2018-04-26 (×4): qty 1

## 2018-04-26 MED ORDER — IPRATROPIUM-ALBUTEROL 0.5-2.5 (3) MG/3ML IN SOLN: 3.0000 mL | Freq: Four times a day (QID) | RESPIRATORY_TRACT | Status: DC | PRN

## 2018-04-26 MED ORDER — TIOTROPIUM BROMIDE MONOHYDRATE 18 MCG IN CAPS
18.0000 ug | ORAL_CAPSULE | Freq: Every day | RESPIRATORY_TRACT | Status: DC
  Administered 2018-04-26: 18 ug via RESPIRATORY_TRACT
  Filled 2018-04-26 (×2): qty 5

## 2018-04-26 MED ORDER — ADULT MULTIVITAMIN W/MINERALS CH
1.0000 | ORAL_TABLET | Freq: Every day | ORAL | Status: DC
  Administered 2018-04-26 – 2018-04-30 (×5): 1 via ORAL
  Filled 2018-04-26 (×5): qty 1

## 2018-04-26 MED ORDER — IPRATROPIUM-ALBUTEROL 0.5-2.5 (3) MG/3ML IN SOLN
3.0000 mL | Freq: Once | RESPIRATORY_TRACT | Status: AC
  Administered 2018-04-26: 3 mL via RESPIRATORY_TRACT
  Filled 2018-04-26 (×2): qty 3

## 2018-04-26 MED ORDER — GUAIFENESIN ER 600 MG PO TB12
600.0000 mg | ORAL_TABLET | Freq: Two times a day (BID) | ORAL | Status: DC
  Administered 2018-04-26 – 2018-04-30 (×9): 600 mg via ORAL
  Filled 2018-04-26 (×9): qty 1

## 2018-04-26 MED ORDER — PANTOPRAZOLE SODIUM 40 MG PO TBEC
40.0000 mg | DELAYED_RELEASE_TABLET | Freq: Every day | ORAL | Status: DC
  Administered 2018-04-26 – 2018-04-30 (×5): 40 mg via ORAL
  Filled 2018-04-26 (×5): qty 1

## 2018-04-26 MED ORDER — IPRATROPIUM-ALBUTEROL 0.5-2.5 (3) MG/3ML IN SOLN
3.0000 mL | Freq: Four times a day (QID) | RESPIRATORY_TRACT | Status: DC
  Administered 2018-04-26: 3 mL via RESPIRATORY_TRACT
  Filled 2018-04-26: qty 3

## 2018-04-26 MED ORDER — TEMAZEPAM 15 MG PO CAPS
30.0000 mg | ORAL_CAPSULE | Freq: Every evening | ORAL | Status: DC | PRN
Start: 2018-04-26 — End: 2018-04-30
  Administered 2018-04-26 – 2018-04-27 (×2): 30 mg via ORAL
  Filled 2018-04-26 (×2): qty 2

## 2018-04-26 MED ORDER — ROSUVASTATIN CALCIUM 10 MG PO TABS
10.0000 mg | ORAL_TABLET | Freq: Every day | ORAL | Status: DC
  Administered 2018-04-26 – 2018-04-30 (×5): 10 mg via ORAL
  Filled 2018-04-26 (×5): qty 1

## 2018-04-26 MED ORDER — DILTIAZEM HCL ER COATED BEADS 180 MG PO CP24
180.0000 mg | ORAL_CAPSULE | Freq: Every day | ORAL | Status: DC
  Administered 2018-04-26 – 2018-04-30 (×5): 180 mg via ORAL
  Filled 2018-04-26 (×6): qty 1

## 2018-04-26 MED ORDER — SODIUM CHLORIDE 0.9 % IV SOLN
INTRAVENOUS | Status: DC
  Administered 2018-04-26 – 2018-04-27 (×3): via INTRAVENOUS

## 2018-04-26 MED ORDER — BISACODYL 5 MG PO TBEC: 5.0000 mg | DELAYED_RELEASE_TABLET | Freq: Every day | ORAL | Status: DC | PRN

## 2018-04-26 MED ORDER — BENZONATATE 100 MG PO CAPS: 100.0000 mg | ORAL_CAPSULE | Freq: Three times a day (TID) | ORAL | Status: DC | PRN

## 2018-04-26 MED ORDER — LEVOFLOXACIN IN D5W 750 MG/150ML IV SOLN
750.0000 mg | INTRAVENOUS | Status: DC
  Administered 2018-04-26 – 2018-04-27 (×2): 750 mg via INTRAVENOUS
  Filled 2018-04-26 (×2): qty 150

## 2018-04-26 MED ORDER — OXYCODONE HCL 5 MG PO TABS: 5.0000 mg | ORAL_TABLET | ORAL | Status: DC | PRN

## 2018-04-26 MED ORDER — IPRATROPIUM-ALBUTEROL 0.5-2.5 (3) MG/3ML IN SOLN
3.0000 mL | Freq: Three times a day (TID) | RESPIRATORY_TRACT | Status: DC
  Administered 2018-04-26 – 2018-04-30 (×11): 3 mL via RESPIRATORY_TRACT
  Filled 2018-04-26 (×11): qty 3

## 2018-04-26 MED ORDER — IPRATROPIUM-ALBUTEROL 0.5-2.5 (3) MG/3ML IN SOLN
3.0000 mL | Freq: Once | RESPIRATORY_TRACT | Status: AC
  Administered 2018-04-26: 3 mL via RESPIRATORY_TRACT
  Filled 2018-04-26: qty 3

## 2018-04-26 MED ORDER — POLYETHYLENE GLYCOL 3350 17 G PO PACK
17.0000 g | PACK | Freq: Every day | ORAL | Status: DC
  Administered 2018-04-26 – 2018-04-29 (×4): 17 g via ORAL
  Filled 2018-04-26 (×4): qty 1

## 2018-04-26 MED ORDER — TAMSULOSIN HCL 0.4 MG PO CAPS
0.4000 mg | ORAL_CAPSULE | Freq: Every day | ORAL | Status: DC
  Administered 2018-04-26 – 2018-04-30 (×5): 0.4 mg via ORAL
  Filled 2018-04-26 (×5): qty 1

## 2018-04-26 MED ORDER — VITAMIN C 500 MG PO TABS
500.0000 mg | ORAL_TABLET | Freq: Two times a day (BID) | ORAL | Status: DC
Start: 2018-04-26 — End: 2018-04-30
  Administered 2018-04-26 – 2018-04-30 (×9): 500 mg via ORAL
  Filled 2018-04-26 (×9): qty 1

## 2018-04-26 MED ORDER — FLUTICASONE PROPIONATE 50 MCG/ACT NA SUSP
1.0000 | Freq: Every day | NASAL | Status: DC
  Administered 2018-04-26 – 2018-04-29 (×4): 1 via NASAL
  Filled 2018-04-26: qty 16

## 2018-04-26 MED ORDER — METOPROLOL TARTRATE 12.5 MG HALF TABLET
12.5000 mg | ORAL_TABLET | Freq: Two times a day (BID) | ORAL | Status: DC
  Administered 2018-04-26 – 2018-04-30 (×9): 12.5 mg via ORAL
  Filled 2018-04-26 (×9): qty 1

## 2018-04-26 MED ORDER — LEVOFLOXACIN 500 MG PO TABS
500.0000 mg | ORAL_TABLET | Freq: Once | ORAL | Status: AC
  Administered 2018-04-26: 500 mg via ORAL
  Filled 2018-04-26: qty 1

## 2018-04-26 MED ORDER — MOMETASONE FURO-FORMOTEROL FUM 200-5 MCG/ACT IN AERO
2.0000 | INHALATION_SPRAY | Freq: Two times a day (BID) | RESPIRATORY_TRACT | Status: DC
  Administered 2018-04-26 – 2018-04-29 (×7): 2 via RESPIRATORY_TRACT
  Filled 2018-04-26 (×2): qty 8.8

## 2018-04-26 MED ORDER — APIXABAN 5 MG PO TABS
5.0000 mg | ORAL_TABLET | Freq: Two times a day (BID) | ORAL | Status: DC
  Administered 2018-04-26 – 2018-04-30 (×9): 5 mg via ORAL
  Filled 2018-04-26 (×10): qty 1

## 2018-04-26 MED ORDER — ONDANSETRON HCL 4 MG PO TABS: 4.0000 mg | ORAL_TABLET | Freq: Four times a day (QID) | ORAL | Status: DC | PRN

## 2018-04-26 MED ORDER — SILDENAFIL CITRATE 20 MG PO TABS
20.0000 mg | ORAL_TABLET | Freq: Two times a day (BID) | ORAL | Status: DC
  Administered 2018-04-26 – 2018-04-30 (×9): 20 mg via ORAL
  Filled 2018-04-26 (×11): qty 1

## 2018-04-26 MED ORDER — MAGNESIUM CITRATE PO SOLN: 1.0000 | Freq: Once | ORAL | Status: DC | PRN

## 2018-04-26 MED ORDER — METHYLPREDNISOLONE SODIUM SUCC 125 MG IJ SOLR
60.0000 mg | Freq: Four times a day (QID) | INTRAMUSCULAR | Status: DC
  Administered 2018-04-26 – 2018-04-28 (×10): 60 mg via INTRAVENOUS
  Filled 2018-04-26 (×11): qty 2

## 2018-04-26 MED ORDER — SENNOSIDES-DOCUSATE SODIUM 8.6-50 MG PO TABS: 1.0000 | ORAL_TABLET | Freq: Every evening | ORAL | Status: DC | PRN

## 2018-04-26 MED ORDER — ACETAMINOPHEN 650 MG RE SUPP: 650.0000 mg | Freq: Four times a day (QID) | RECTAL | Status: DC | PRN

## 2018-04-26 MED ORDER — HYDROCORTISONE 1 % EX CREA
1.0000 "application " | TOPICAL_CREAM | Freq: Every day | CUTANEOUS | Status: DC | PRN
  Filled 2018-04-26: qty 28

## 2018-04-26 MED ORDER — ONDANSETRON HCL 4 MG/2ML IJ SOLN: 4.0000 mg | Freq: Four times a day (QID) | INTRAMUSCULAR | Status: DC | PRN

## 2018-04-26 MED ORDER — DEXAMETHASONE SODIUM PHOSPHATE 10 MG/ML IJ SOLN
10.0000 mg | Freq: Once | INTRAMUSCULAR | Status: AC
  Administered 2018-04-26: 10 mg via INTRAVENOUS
  Filled 2018-04-26: qty 1

## 2018-04-26 NOTE — ED Notes (Signed)
Assisted pt w/ordering [employer] representative.

## 2018-04-26 NOTE — ED Notes (Signed)
[place]

## 2018-04-26 NOTE — Progress Notes (Signed)
Pharmacy Antibiotic Note  Andrew [name] is a 77 y.o. male admitted on 04/25/2018 with pneumonia.  Pharmacy has been consulted for Levaquin dosing. WBC elevated. Renal function good.   Plan: Levaquin 750 mg IV q24h Trend WBC, temp, renal function  F/U infectious work-up  Height: 5\' 7"  (462.8 cm) Weight: 140 lb (63.5 kg) IBW/kg (Calculated) : 66.1  Temp (24hrs), Avg:99.8 F (37.7 C), Min:99.8 F (37.7 C), Max:99.8 F (37.7 C)  Recent Labs  Lab 04/25/18 2309 04/25/18 2338 04/26/18 0340  WBC 15.4*  --   --   CREATININE 0.98  --   --   LATICACIDVEN  --  1.55 1.00    Estimated Creatinine Clearance: 55.8 mL/min (by C-G formula based on SCr of 0.98 mg/dL).    No Known Allergies  Andrew [name] 04/26/2018 3:52 AM

## 2018-04-26 NOTE — ED Notes (Signed)
Note sent to pharmacy about both missing inhalers scheduled for 8am.

## 2018-04-26 NOTE — Progress Notes (Signed)
Andrew [name] is a 77 y.o. male patient admitted from ED awake, alert - oriented  X 4 - no acute distress noted.  VSS - Blood pressure 116/67, pulse 72, temperature 98.8 F (37.1 C), temperature source Oral, resp. rate 19, height 5\' 7"  (1.702 m), weight 63.5 kg (140 lb), SpO2 99 %.    IV in place, occlusive dsg intact without redness.  Orientation to room, and floor completed with information packet given to patient/family.  Patient declined safety video at this time.  Admission INP armband ID verified with patient/family, and in place.   SR up x 2, fall assessment complete, with patient and family able to verbalize understanding of risk associated with falls, and verbalized understanding to call nsg before up out of bed.  Call light within reach, patient able to voice, and demonstrate understanding.  Skin, clean-dry- intact without evidence of bruising, or skin tears.   No evidence of skin break down noted on exam.     Will cont to eval and treat per MD orders.  Holley Raring, RN 04/26/2018 4:16 PM

## 2018-04-26 NOTE — ED Notes (Signed)
Walked pt on 3L O2 [place]. Walked about 50 feet before complaining of SOB. O2 dropped to 91 on 3L [place]. Pt back in bed resting on 3L. Admit doc at bedside.

## 2018-04-26 NOTE — ED Notes (Signed)
Pt eating lunch

## 2018-04-26 NOTE — Progress Notes (Signed)
PROGRESS NOTE    Andrew [name]  UMP:[id number] DOB: 1941/03/16 DOA: 04/25/2018 PCP: Lujean Amel, MD  Brief Narrative: Andrew [name] is a 77 y.o. male with a known history of home O2 dependent COPD, GERD, hypertension, hyperlipidemia, pulmonary hypertension and obstructive sleep apnea presents to the emergency department for evaluation of shortness of breath.  Patient was in a usual state of health until this afternoon when he describes progressively worsening shortness of breath associated with fever to 102.3, worsening of his chronic productive cough. Symptoms were refractory to his home nebulizer treatment and Advair..  Patient denies weakness, dizziness, chest pain, N/V/C/D, abdominal pain, dysuria/frequency, changes in mental status.    He was hospitalized in March 19. Otherwise there has been no change in status. Patient has been taking medication as prescribed and there has been no recent change in medication or diet.  No recent antibiotics.  There has been no recent illness,  travel or sick contacts.     Assessment & Plan:   Active Problems:   COPD with emphysema ([place])   Chronic hypoxemic respiratory failure ([facility])   History of pulmonary hypertension   BPH (benign prostatic hyperplasia)   Hypertension   Hyperlipidemia   OSA and COPD overlap syndrome ([facility])   Paroxysmal atrial fibrillation ([place])   Sepsis due to pneumonia ([place])   1-Sepsis; Secondary to health care associated PNA;  Continue with IV fluids, IV antibiotics, Levaquin.  Follow blood culture, sputum culture.   2- Acute COPD exacerbation;  Schedule nebulizer. IV solumedrol.  Continue with Advair.  He uses CPAP at HS  3-HTN; Paroxysmal A fib.   on Cardizem, metoprolol/   4-GERD; Prilosec.   BPH; Continue with Proscar and Flomax.   HLD;  On Crestor.   Pulmonary Hypertension;  Continue with sildenafil.    DVT prophylaxis; Eliquis  Code Status: Full code.  Family Communication: care discussed with  patient.  Disposition Plan: home when stable.   Consultants:   none  Procedures: none  Antimicrobials: \ IV Levaquin 6-09  Subjective: He report breathing a little better , report cough. Had fevers at home.   Objective: Vitals:   04/26/18 1200 04/26/18 1300 04/26/18 1330 04/26/18 1400  BP: 137/75 138/80 130/67 108/66  Pulse: 80 94 79 74  Resp: 18 18 18 18   Temp:      TempSrc:      SpO2: 97% 95% 98% 97%  Weight:      Height:        Intake/Output Summary (Last 24 hours) at 04/26/2018 1434 Last data filed at 04/26/2018 1023 Gross per 24 hour  Intake 300 ml  Output 700 ml  Net -400 ml   Filed Weights   04/25/18 2303  Weight: 63.5 kg (140 lb)    Examination:  General exam: Appears calm and comfortable  Respiratory system: bilateral air movement no wheezing. Mild tachypnea.  Cardiovascular system: S1 & S2 heard, RRR. No JVD, murmurs, rubs, gallops or clicks. No pedal edema. Gastrointestinal system: Abdomen is nondistended, soft and nontender. No organomegaly or masses felt. Normal bowel sounds heard. Central nervous system: Alert and oriented. No focal neurological deficits. Extremities: Symmetric 5 x 5 power. Skin: No rashes, lesions or ulcers    Data Reviewed: I have personally reviewed following labs and imaging studies  CBC: Recent Labs  Lab 04/25/18 2309 04/26/18 0534  WBC 15.4* 17.7*  NEUTROABS 12.7*  --   HGB 10.7* 11.1*  HCT 34.3* 36.2*  MCV 90.3 93.1  PLT 273 266  Basic Metabolic Panel: Recent Labs  Lab 04/25/18 2309 04/26/18 0534  NA 134* 139  K 3.4* 4.2  CL 99* 103  CO2 26 23  GLUCOSE 122* 136*  BUN 16 14  CREATININE 0.98 0.94  CALCIUM 8.9 9.4  MG  --  1.9  PHOS  --  3.8   GFR: Estimated Creatinine Clearance: 58.2 mL/min (by C-G formula based on SCr of 0.94 mg/dL). Liver Function Tests: Recent Labs  Lab 04/25/18 2309  AST 16  ALT 18  ALKPHOS 60  BILITOT 0.4  PROT 6.0*  ALBUMIN 3.6   No results for input(s): LIPASE,  AMYLASE in the last 168 hours. No results for input(s): AMMONIA in the last 168 hours. Coagulation Profile: Recent Labs  Lab 04/25/18 2309  INR 1.10   Cardiac Enzymes: No results for input(s): CKTOTAL, CKMB, CKMBINDEX, TROPONINI in the last 168 hours. BNP (last 3 results) No results for input(s): PROBNP in the last 8760 hours. HbA1C: No results for input(s): HGBA1C in the last 72 hours. CBG: No results for input(s): GLUCAP in the last 168 hours. Lipid Profile: No results for input(s): CHOL, HDL, LDLCALC, TRIG, CHOLHDL, LDLDIRECT in the last 72 hours. Thyroid Function Tests: No results for input(s): TSH, T4TOTAL, FREET4, T3FREE, THYROIDAB in the last 72 hours. Anemia Panel: No results for input(s): VITAMINB12, FOLATE, FERRITIN, TIBC, IRON, RETICCTPCT in the last 72 hours. Sepsis Labs: Recent Labs  Lab 04/25/18 2338 04/26/18 0340  LATICACIDVEN 1.55 1.00    No results found for this or any previous visit (from the past 240 hour(s)).       Radiology Studies: Dg Chest 2 View  Result Date: 04/25/2018 CLINICAL DATA:  Acute onset of increased work of breathing. Abdominal bloating and fever. EXAM: CHEST - 2 VIEW COMPARISON:  Chest radiograph performed 01/17/2018 FINDINGS: The lungs are well-aerated. Emphysema is noted at the upper lung zones bilaterally, with underlying bibasilar scarring. Minimal right basilar opacity may reflect atelectasis or possibly mild pneumonia. There is no evidence of pleural effusion or pneumothorax. The heart is normal in size; the mediastinal contour is within normal limits. No acute osseous abnormalities are seen. IMPRESSION: 1. Minimal right basilar opacity may reflect atelectasis or possibly mild pneumonia. 2. Emphysema at the upper lung zones bilaterally, with underlying bibasilar scarring. Electronically Signed   By: Garald Balding M.D.   On: 04/25/2018 23:53        Scheduled Meds: . apixaban  5 mg Oral BID  . diltiazem  180 mg Oral Daily  .  finasteride  5 mg Oral QHS  . fluticasone  1 spray Each Nare QHS  . guaiFENesin  600 mg Oral BID  . ipratropium-albuterol  3 mL Nebulization Q6H  . methylPREDNISolone (SOLU-MEDROL) injection  60 mg Intravenous Q6H  . metoprolol tartrate  12.5 mg Oral BID  . mometasone-formoterol  2 puff Inhalation BID  . multivitamin with minerals  1 tablet Oral Daily  . pantoprazole  40 mg Oral Daily  . polyethylene glycol  17 g Oral QHS  . rosuvastatin  10 mg Oral Daily  . sildenafil  20 mg Oral BID  . tamsulosin  0.4 mg Oral QPC breakfast  . vitamin C  500 mg Oral BID   Continuous Infusions: . sodium chloride 75 mL/hr at 04/26/18 0520  . levofloxacin (LEVAQUIN) IV       LOS: 0 days    Time spent: 35 minutes.     Elmarie Shiley, MD [facility] Pager [phone]  If 7PM-7AM,  please contact night-coverage [email or web address] Password [facility] 04/26/2018, 2:34 PM

## 2018-04-26 NOTE — ED Notes (Signed)
[place] doctor at bedside. Decreased oxygen from 3L to 2.5L [name].

## 2018-04-26 NOTE — H&P (Signed)
History and Physical   [facility] [place] @ [place] Admission History and Physical [employer], D.O.    Patient Name: Andrew [name] MR#: [id number] Date of Birth: 01-22-1941 Date of Admission: 04/25/2018  Referring MD/NP/PA: Dr. Dina Rich Primary Care Physician: Lujean Amel, MD  Chief Complaint:  Chief Complaint  Patient presents with  . Shortness of Breath    HPI: Andrew [name] is a 77 y.o. male with a known history of home O2 dependent COPD, GERD, hypertension, hyperlipidemia, pulmonary hypertension and obstructive sleep apnea presents to the emergency department for evaluation of shortness of breath.  Patient was in a usual state of health until this afternoon when he describes progressively worsening shortness of breath associated with fever to 102.3, worsening of his chronic productive cough. Symptoms were refractory to his home nebulizer treatment and Advair..  Patient denies weakness, dizziness, chest pain, N/V/C/D, abdominal pain, dysuria/frequency, changes in mental status.    He was hospitalized in March 19. Otherwise there has been no change in status. Patient has been taking medication as prescribed and there has been no recent change in medication or diet.  No recent antibiotics.  There has been no recent illness,  travel or sick contacts.    EMS/ED Course: Patient received Duoneb, decadron, Levaquin. Medical admission has been requested for further management of COPD exac and HCAP.  Review of Systems:  CONSTITUTIONAL: Positive fever/chills, negative fatigue, weakness, weight gain/loss, headache. EYES: No blurry or double vision. ENT: No tinnitus, postnasal drip, redness or soreness of the oropharynx. RESPIRATORY: Positive cough, dyspnea, wheeze.  No hemoptysis.  CARDIOVASCULAR: No chest pain, palpitations, syncope, orthopnea. No lower extremity edema.  GASTROINTESTINAL: No nausea, vomiting, abdominal pain, diarrhea, constipation.  No hematemesis,  melena or hematochezia. GENITOURINARY: No dysuria, frequency, hematuria. ENDOCRINE: No polyuria or nocturia. No heat or cold intolerance. HEMATOLOGY: No anemia, bruising, bleeding. INTEGUMENTARY: No rashes, ulcers, lesions. MUSCULOSKELETAL: No arthritis, gout NEUROLOGIC: No numbness, tingling, ataxia, seizure-type activity, weakness. PSYCHIATRIC: No anxiety, depression, insomnia.   Past Medical History:  Diagnosis Date  . Anemia   . BiPAP (biphasic positive airway pressure) dependence    Pt denies history of OSA  . BPH (benign prostatic hyperplasia)   . Cancer ([place])    skin  . COPD (chronic obstructive pulmonary disease) ([place])   . Dysphagia   . Emphysema lung ([place])   . GERD (gastroesophageal reflux disease)    " Silent reflux"  . Hearing loss    right ear  . History of hiatal hernia   . HLD (hyperlipidemia)   . Hypertension   . Oxygen dependent    2-3 liters  . Oxygen dependent   . Pneumonia   . Pulmonary hypertension ([place])   . Sleep apnea    wears cpap    Past Surgical History:  Procedure Laterality Date  . CARDIAC CATHETERIZATION    . CATARACT EXTRACTION W/ INTRAOCULAR LENS  IMPLANT, BILATERAL    . COLONOSCOPY WITH PROPOFOL N/A 04/20/2018   Procedure: COLONOSCOPY WITH PROPOFOL;  Surgeon: Wilford Corner, MD;  Location: [place];  Service: Endoscopy;  Laterality: N/A;  . ESOPHAGOGASTRODUODENOSCOPY (EGD) WITH PROPOFOL N/A 08/23/2016   Procedure: ESOPHAGOGASTRODUODENOSCOPY (EGD) WITH PROPOFOL;  Surgeon: Wilford Corner, MD;  Location: [facility] ENDOSCOPY;  Service: Endoscopy;  Laterality: N/A;  . ESOPHAGOGASTRODUODENOSCOPY (EGD) WITH PROPOFOL N/A 04/20/2018   Procedure: ESOPHAGOGASTRODUODENOSCOPY (EGD) WITH PROPOFOL;  Surgeon: Wilford Corner, MD;  Location: [place];  Service: Endoscopy;  Laterality: N/A;  . HOT HEMOSTASIS N/A 04/20/2018   Procedure: HOT  HEMOSTASIS (ARGON PLASMA COAGULATION/BICAP);  Surgeon: Wilford Corner, MD;  Location: [place];  Service:  Endoscopy;  Laterality: N/A;  . LUNG SURGERY    . POLYPECTOMY  04/20/2018   Procedure: POLYPECTOMY;  Surgeon: Wilford Corner, MD;  Location: [facility] ENDOSCOPY;  Service: Endoscopy;;  . TONSILLECTOMY       reports that he quit smoking about 33 years ago. His smoking use included cigarettes. He smoked 0.00 packs per day for 0.00 years. He has never used smokeless tobacco. He reports that he does not drink alcohol or use drugs.  No Known Allergies  Family History  Problem Relation Age of Onset  . Cancer Maternal Uncle   . Pancreatitis Mother   . Bone cancer Brother   . Stroke Maternal Grandfather   . Congestive Heart Failure Maternal Grandfather     Prior to Admission medications   Medication Sig Start Date End Date Taking? Authorizing Provider  ADVAIR DISKUS 250-50 MCG/DOSE AEPB Inhale 1 puff into the lungs 2 (two) times daily. 11/01/15   Noralee Space, MD  albuterol (PROVENTIL HFA;VENTOLIN HFA) 108 (90 Base) MCG/ACT inhaler Inhale 2 puffs into the lungs every 6 (six) hours as needed for wheezing or shortness of breath. 04/17/17   Noralee Space, MD  apixaban (ELIQUIS) 5 MG TABS tablet Take 1 tablet (5 mg total) by mouth 2 (two) times daily. 11/19/17   Skeet Latch, MD  benzonatate (TESSALON) 100 MG capsule Take 1 capsule (100 mg total) by mouth 3 (three) times daily as needed for cough. 04/14/18   Noralee Space, MD  diltiazem (CARDIZEM CD) 180 MG 24 hr capsule Take 1 capsule (180 mg total) by mouth daily. 03/02/18 11/27/18  Noralee Space, MD  finasteride (PROSCAR) 5 MG tablet Take 5 mg by mouth at bedtime.     [provider]  fluticasone (FLONASE) 50 MCG/ACT nasal spray Place 1 spray into both nostrils at bedtime.  06/24/17   [provider]  guaiFENesin (MUCINEX) 600 MG 12 hr tablet Take 1 tablet (600 mg total) by mouth 2 (two) times daily. 09/12/17   Lavina Hamman, MD  hydrocortisone cream 1 % Apply 1 application topically daily as needed (Eczema on face and head).     [provider]  ipratropium-albuterol (DUONEB) 0.5-2.5 (3) MG/3ML SOLN Take 3 mLs by nebulization 3 (three) times daily. 11/04/17   Noralee Space, MD  liver oil-zinc oxide (DESITIN) 40 % ointment Apply 1 application topically daily.    [provider]  metoprolol tartrate (LOPRESSOR) 25 MG tablet Take 12.5 mg by mouth 2 (two) times daily.     [provider]  Multiple Vitamins-Minerals (MULTIVITAMIN WITH MINERALS) tablet Take 1 tablet by mouth daily.    [provider]  omeprazole (PRILOSEC) 40 MG capsule Take 40 mg by mouth 2 (two) times daily.     [provider]  OXYGEN Inhale 2-3 L into the lungs See admin instructions. 2L daytime, 3L at night     [provider]  polyethylene glycol (MIRALAX / GLYCOLAX) packet Take 17 g by mouth daily. Patient taking differently: Take 17 g by mouth at bedtime. Mix in 8 oz liquid and drink 05/08/16   Mesner, Corene Cornea, MD  predniSONE (DELTASONE) 10 MG tablet Take 1 tablet (10 mg total) by mouth daily. 04/14/18   Noralee Space, MD  rosuvastatin (CRESTOR) 10 MG tablet Take 10 mg by mouth daily.    [provider]  sildenafil (REVATIO) 20 MG tablet Take  1 tablet (20 mg total) by mouth 2 (two) times daily. 10/23/16   Noralee Space, MD  SPIRIVA HANDIHALER 18 MCG inhalation capsule INHALE THE CONTENTS OF 1 CAPSULE EVERY DAY 06/03/17   Noralee Space, MD  tamsulosin [facility]) 0.4 MG CAPS capsule Take 1 capsule (0.4 mg total) by mouth daily after breakfast. 01/03/16   Nita Sells, MD  temazepam (RESTORIL) 30 MG capsule TAKE ONE CAPSULE BY MOUTH EVERY NIGHT AT BEDTIME 03/25/18   Noralee Space, MD  vitamin C (ASCORBIC ACID) 500 MG tablet Take 500 mg by mouth 2 (two) times daily.     [provider]    Physical Exam: Vitals:   04/26/18 0008 04/26/18 0015 04/26/18 0045 04/26/18 0320  BP:  130/79 127/71   Pulse: 98 99 (!) 105 97  Resp: 19 18 (!) 37 15  Temp:      TempSrc:      SpO2: (!) 89%  97% 93% 97%  Weight:      Height:        GENERAL: 77 y.o.-year-old male patient, well-developed, well-nourished lying in the bed in no acute distress.  Pleasant and cooperative.  Slightly anxious. HEENT: Head atraumatic, normocephalic. Pupils equal. Mucus membranes moist. NECK: Supple. No JVD. CHEST: Diminished basilar breath sounds bilaterally. Positive diffuse wheeze, No wheezing, rales, rhonchi or crackles. No use of accessory muscles of respiration.  No reproducible chest wall tenderness.  CARDIOVASCULAR: S1, S2 normal. No murmurs, rubs, or gallops. Cap refill <2 seconds. Pulses intact distally.  ABDOMEN: Soft, nondistended, nontender. No rebound, guarding, rigidity. Normoactive bowel sounds present in all four quadrants.  EXTREMITIES: No pedal edema, cyanosis, or clubbing. No calf tenderness or Homan's sign.  NEUROLOGIC: The patient is alert and oriented x 3. Cranial nerves II through XII are grossly intact with no focal sensorimotor deficit. PSYCHIATRIC:  Normal affect, mood, thought content. SKIN: Warm, dry, and intact without obvious rash, lesion, or ulcer.    Labs on Admission:  CBC: Recent Labs  Lab 04/25/18 2309  WBC 15.4*  NEUTROABS 12.7*  HGB 10.7*  HCT 34.3*  MCV 90.3  PLT 789   Basic Metabolic Panel: Recent Labs  Lab 04/25/18 2309  NA 134*  K 3.4*  CL 99*  CO2 26  GLUCOSE 122*  BUN 16  CREATININE 0.98  CALCIUM 8.9   GFR: Estimated Creatinine Clearance: 55.8 mL/min (by C-G formula based on SCr of 0.98 mg/dL). Liver Function Tests: Recent Labs  Lab 04/25/18 2309  AST 16  ALT 18  ALKPHOS 60  BILITOT 0.4  PROT 6.0*  ALBUMIN 3.6   No results for input(s): LIPASE, AMYLASE in the last 168 hours. No results for input(s): AMMONIA in the last 168 hours. Coagulation Profile: Recent Labs  Lab 04/25/18 2309  INR 1.10   Cardiac Enzymes: No results for input(s): CKTOTAL, CKMB, CKMBINDEX, TROPONINI in the last 168 hours. BNP (last 3 results) No  results for input(s): PROBNP in the last 8760 hours. HbA1C: No results for input(s): HGBA1C in the last 72 hours. CBG: No results for input(s): GLUCAP in the last 168 hours. Lipid Profile: No results for input(s): CHOL, HDL, LDLCALC, TRIG, CHOLHDL, LDLDIRECT in the last 72 hours. Thyroid Function Tests: No results for input(s): TSH, T4TOTAL, FREET4, T3FREE, THYROIDAB in the last 72 hours. Anemia Panel: No results for input(s): VITAMINB12, FOLATE, FERRITIN, TIBC, IRON, RETICCTPCT in the last 72 hours. Urine analysis:    Component Value Date/Time   COLORURINE YELLOW 12/07/2016 2026   APPEARANCEUR  CLEAR 12/07/2016 2026   LABSPEC 1.013 12/07/2016 2026   PHURINE 7.0 12/07/2016 2026   GLUCOSEU >=500 (A) 12/07/2016 2026   HGBUR NEGATIVE 12/07/2016 2026   BILIRUBINUR NEGATIVE 12/07/2016 2026   KETONESUR NEGATIVE 12/07/2016 2026   PROTEINUR NEGATIVE 12/07/2016 2026   NITRITE NEGATIVE 12/07/2016 2026   LEUKOCYTESUR NEGATIVE 12/07/2016 2026   Sepsis Labs: @LABRCNTIP (procalcitonin:4,lacticidven:4) )No results found for this or any previous visit (from the past 240 hour(s)).   Radiological Exams on Admission: Dg Chest 2 View  Result Date: 04/25/2018 CLINICAL DATA:  Acute onset of increased work of breathing. Abdominal bloating and fever. EXAM: CHEST - 2 VIEW COMPARISON:  Chest radiograph performed 01/17/2018 FINDINGS: The lungs are well-aerated. Emphysema is noted at the upper lung zones bilaterally, with underlying bibasilar scarring. Minimal right basilar opacity may reflect atelectasis or possibly mild pneumonia. There is no evidence of pleural effusion or pneumothorax. The heart is normal in size; the mediastinal contour is within normal limits. No acute osseous abnormalities are seen. IMPRESSION: 1. Minimal right basilar opacity may reflect atelectasis or possibly mild pneumonia. 2. Emphysema at the upper lung zones bilaterally, with underlying bibasilar scarring. Electronically Signed   By:  Garald Balding M.D.   On: 04/25/2018 23:53    EKG: Sinus tach @ 104 bpm with normal axis and nonspecific ST-T wave changes.   Assessment/Plan  This is a 77 y.o. male with a history of  home O2 dependent COPD, GERD, hypertension, hyperlipidemia, pulmonary hypertension and obstructive sleep apnea now being admitted with:  #. #. Sepsis secondary to HCAP - Admit to inpatient with telemetry monitoring - IV antibiotics: Levaquin - IV fluid hydration, O2, mednebs and expectorants as needed.  - Follow up blood, sputum cultures.  - Repeat CBC in am.   #. Acute exacerbation of COPD - IV steroids and azithromycin - Nebulizers, O2 therapy and expectorants as needed.  - Continuous pulse oximetry - continue Advair, Flonase, Spiriva - Consider pulmonary consult if not improving.   #. History of hypertension Continue Cardizem, metoprolol  #. History of GERD - Continue Prilosec  #. History of BPH - Continue Proscar, Flomax  #. History of hyperlipidemia - Continue Crestor  #. History of pulmonary hypertension - Continue sildenafil   Admission status: inpatient IV Fluids: normal saline Diet/Nutrition: heart healthy Consults called: none  DVT Px: Eliquis, SCDs and early ambulation. Code Status: Full Code  Disposition Plan: To home in *2-3days - All the records are reviewed and case discussed with ED provider. Management plans discussed with the patient and/or family who express understanding and agree with plan of care.  Ethlyn Alto D.O. on 04/26/2018 at 3:43 AM CC: Primary care physician; Lujean Amel, MD   04/26/2018, 3:43 AM

## 2018-04-26 NOTE — ED Provider Notes (Signed)
[facility] EMERGENCY DEPARTMENT Provider Note   CSN: [id number] Arrival date & time: 04/25/18  2257     History   Chief Complaint Chief Complaint  Patient presents with  . Shortness of Breath    HPI Andrew [name] is a 77 y.o. male.  HPI  This is a 77 year old male with history of COPD, chronic respiratory failure on oxygen, reflux who presents with chills, fever, shortness of breath.  Patient reports he had a fever of 102.3 at home.  At the time he felt "very bad" and had increasing shortness of breath.  He used his inhaler and Advair at home.  He did take some Tylenol.  Since that time he states he feels much better.  He reports an ongoing productive cough but has a history of reflux and states that he normally has cough and throat clearing.  He is on 2 to 3 L of oxygen at home.  No new oxygen requirements.  Last hospitalization was in March.  He denies any nausea, vomiting, abdominal pain, diarrhea.  He does report some abdominal bloating.  He had a colonoscopy on Monday.  Denies any bloody stools or change in bowel movements otherwise.  Past Medical History:  Diagnosis Date  . Anemia   . BiPAP (biphasic positive airway pressure) dependence    Pt denies history of OSA  . BPH (benign prostatic hyperplasia)   . Cancer ([place])    skin  . COPD (chronic obstructive pulmonary disease) ([place])   . Dysphagia   . Emphysema lung ([place])   . GERD (gastroesophageal reflux disease)    " Silent reflux"  . Hearing loss    right ear  . History of hiatal hernia   . HLD (hyperlipidemia)   . Hypertension   . Oxygen dependent    2-3 liters  . Oxygen dependent   . Pneumonia   . Pulmonary hypertension ([place])   . Sleep apnea    wears cpap    Patient Active Problem List   Diagnosis Date Noted  . Iron deficiency anemia, unspecified 04/20/2018  . PVCs (premature ventricular contractions) 02/02/2018  . Pressure injury of skin 01/18/2018  . Influenza-like illness 01/17/2018    . History of pneumonia 12/16/2017  . OSA on CPAP 09/22/2017  . OSA and COPD overlap syndrome ([place]) 09/11/2017  . Paroxysmal atrial fibrillation ([facility])   . Malnutrition of moderate degree 09/10/2017  . Sepsis ([place]) 09/09/2017  . Diarrhea 09/09/2017  . Protein-calorie malnutrition, severe 12/09/2016  . Dysphagia 08/23/2016  . Normocytic anemia 06/28/2016  . Pressure sore on buttocks 06/28/2016  . Pulmonary hypertension ([place]) 02/20/2016  . GERD (gastroesophageal reflux disease) 02/20/2016  . COPD with acute exacerbation ([place]) 02/13/2016  . Acute respiratory failure with hypoxia ([place]) 02/12/2016  . Hypertension 02/11/2016  . Hyperlipidemia 02/11/2016  . CAP (community acquired pneumonia)   . BPH (benign prostatic hyperplasia) 12/29/2015  . Chronic respiratory failure with hypoxia ([place]) 12/28/2015  . COPD with emphysema ([place]) 10/02/2015  . Chronic hypoxemic respiratory failure ([place]) 10/02/2015  . History of pulmonary hypertension 10/02/2015    Past Surgical History:  Procedure Laterality Date  . CARDIAC CATHETERIZATION    . CATARACT EXTRACTION W/ INTRAOCULAR LENS  IMPLANT, BILATERAL    . COLONOSCOPY WITH PROPOFOL N/A 04/20/2018   Procedure: COLONOSCOPY WITH PROPOFOL;  Surgeon: Wilford Corner, MD;  Location: [facility];  Service: Endoscopy;  Laterality: N/A;  . ESOPHAGOGASTRODUODENOSCOPY (EGD) WITH PROPOFOL N/A 08/23/2016   Procedure: ESOPHAGOGASTRODUODENOSCOPY (EGD) WITH PROPOFOL;  Surgeon: Evette Doffing  Michail Sermon, MD;  Location: [facility];  Service: Endoscopy;  Laterality: N/A;  . ESOPHAGOGASTRODUODENOSCOPY (EGD) WITH PROPOFOL N/A 04/20/2018   Procedure: ESOPHAGOGASTRODUODENOSCOPY (EGD) WITH PROPOFOL;  Surgeon: Wilford Corner, MD;  Location: [facility];  Service: Endoscopy;  Laterality: N/A;  . HOT HEMOSTASIS N/A 04/20/2018   Procedure: HOT HEMOSTASIS (ARGON PLASMA COAGULATION/BICAP);  Surgeon: Wilford Corner, MD;  Location: [facility];  Service: Endoscopy;  Laterality: N/A;  .  LUNG SURGERY    . POLYPECTOMY  04/20/2018   Procedure: POLYPECTOMY;  Surgeon: Wilford Corner, MD;  Location: [facility] ENDOSCOPY;  Service: Endoscopy;;  . TONSILLECTOMY          Home Medications    Prior to Admission medications   Medication Sig Start Date End Date Taking? Authorizing Provider  ADVAIR DISKUS 250-50 MCG/DOSE AEPB Inhale 1 puff into the lungs 2 (two) times daily. 11/01/15   Noralee Space, MD  albuterol (PROVENTIL HFA;VENTOLIN HFA) 108 (90 Base) MCG/ACT inhaler Inhale 2 puffs into the lungs every 6 (six) hours as needed for wheezing or shortness of breath. 04/17/17   Noralee Space, MD  apixaban (ELIQUIS) 5 MG TABS tablet Take 1 tablet (5 mg total) by mouth 2 (two) times daily. 11/19/17   Skeet Latch, MD  benzonatate (TESSALON) 100 MG capsule Take 1 capsule (100 mg total) by mouth 3 (three) times daily as needed for cough. 04/14/18   Noralee Space, MD  diltiazem (CARDIZEM CD) 180 MG 24 hr capsule Take 1 capsule (180 mg total) by mouth daily. 03/02/18 11/27/18  Noralee Space, MD  finasteride (PROSCAR) 5 MG tablet Take 5 mg by mouth at bedtime.     [provider]  fluticasone (FLONASE) 50 MCG/ACT nasal spray Place 1 spray into both nostrils at bedtime.  06/24/17   [provider]  guaiFENesin (MUCINEX) 600 MG 12 hr tablet Take 1 tablet (600 mg total) by mouth 2 (two) times daily. 09/12/17   Lavina Hamman, MD  hydrocortisone cream 1 % Apply 1 application topically daily as needed (Eczema on face and head).    [provider]  ipratropium-albuterol (DUONEB) 0.5-2.5 (3) MG/3ML SOLN Take 3 mLs by nebulization 3 (three) times daily. 11/04/17   Noralee Space, MD  liver oil-zinc oxide (DESITIN) 40 % ointment Apply 1 application topically daily.    [provider]  metoprolol tartrate (LOPRESSOR) 25 MG tablet Take 12.5 mg by mouth 2 (two) times daily.     [provider]  Multiple Vitamins-Minerals (MULTIVITAMIN WITH MINERALS) tablet Take 1  tablet by mouth daily.    [provider]  omeprazole (PRILOSEC) 40 MG capsule Take 40 mg by mouth 2 (two) times daily.     [provider]  OXYGEN Inhale 2-3 L into the lungs See admin instructions. 2L daytime, 3L at night     [provider]  polyethylene glycol (MIRALAX / GLYCOLAX) packet Take 17 g by mouth daily. Patient taking differently: Take 17 g by mouth at bedtime. Mix in 8 oz liquid and drink 05/08/16   Mesner, Corene Cornea, MD  predniSONE (DELTASONE) 10 MG tablet Take 1 tablet (10 mg total) by mouth daily. 04/14/18   Noralee Space, MD  rosuvastatin (CRESTOR) 10 MG tablet Take 10 mg by mouth daily.    [provider]  sildenafil (REVATIO) 20 MG tablet Take 1 tablet (20 mg total) by mouth 2 (two) times daily. 10/23/16   Noralee Space, MD  SPIRIVA HANDIHALER 18 MCG inhalation capsule INHALE THE  CONTENTS OF 1 CAPSULE EVERY DAY 06/03/17   Noralee Space, MD  tamsulosin [facility]) 0.4 MG CAPS capsule Take 1 capsule (0.4 mg total) by mouth daily after breakfast. 01/03/16   Nita Sells, MD  temazepam (RESTORIL) 30 MG capsule TAKE ONE CAPSULE BY MOUTH EVERY NIGHT AT BEDTIME 03/25/18   Noralee Space, MD  vitamin C (ASCORBIC ACID) 500 MG tablet Take 500 mg by mouth 2 (two) times daily.     [provider]    Family History Family History  Problem Relation Age of Onset  . Cancer Maternal Uncle   . Pancreatitis Mother   . Bone cancer Brother   . Stroke Maternal Grandfather   . Congestive Heart Failure Maternal Grandfather     Social History Social History   Tobacco Use  . Smoking status: Former Smoker    Packs/day: 0.00    Years: 0.00    Pack years: 0.00    Types: Cigarettes    Last attempt to quit: 10/01/1984    Years since quitting: 33.5  . Smokeless tobacco: Never Used  Substance Use Topics  . Alcohol use: No    Alcohol/week: 0.0 oz  . Drug use: No     Allergies   Patient has no known allergies.   Review of Systems Review of  Systems  Constitutional: Positive for chills and fever.  Respiratory: Positive for cough, shortness of breath and wheezing.   Cardiovascular: Negative for chest pain.  Gastrointestinal: Positive for abdominal distention. Negative for abdominal pain, constipation, diarrhea, nausea and vomiting.  Genitourinary: Negative for dysuria.  Neurological: Negative for headaches.  All other systems reviewed and are negative.    Physical Exam Updated Vital Signs BP 127/71   Pulse (!) 105   Temp 99.8 F (37.7 C) (Oral)   Resp (!) 37   Ht 5\' 7"  (1.702 m)   Wt 63.5 kg (140 lb)   SpO2 93%   BMI 21.93 kg/m   Physical Exam  Constitutional: He is oriented to person, place, and time.  Chronically ill-appearing, nontoxic  HENT:  Head: Normocephalic and atraumatic.  Mouth/Throat: Oropharynx is clear and moist.  Eyes: Pupils are equal, round, and reactive to light.  Neck: Neck supple.  Cardiovascular: Regular rhythm and normal heart sounds.  No murmur heard. Tachycardia  Pulmonary/Chest: Effort normal. No respiratory distress. He has decreased breath sounds. He has wheezes. He has no rhonchi.  Diminished breath sounds in all lung fields, occasional wheeze, speaking in full sentences, nasal cannula in place  Abdominal: Soft. Bowel sounds are normal. There is no tenderness. There is no rebound.  Musculoskeletal: He exhibits no edema.       Right lower leg: He exhibits no edema.  Lymphadenopathy:    He has no cervical adenopathy.  Neurological: He is alert and oriented to person, place, and time.  Skin: Skin is warm and dry.  Psychiatric: He has a normal mood and affect.  Nursing note and vitals reviewed.    ED Treatments / Results  Labs (all labs ordered are listed, but only abnormal results are displayed) Labs Reviewed  COMPREHENSIVE METABOLIC PANEL - Abnormal; Notable for the following components:      Result Value   Sodium 134 (*)    Potassium 3.4 (*)    Chloride 99 (*)     Glucose, Bld 122 (*)    Total Protein 6.0 (*)    All other components within normal limits  CBC WITH DIFFERENTIAL/PLATELET - Abnormal; Notable for the following  components:   WBC 15.4 (*)    RBC 3.80 (*)    Hemoglobin 10.7 (*)    HCT 34.3 (*)    RDW 16.1 (*)    Neutro Abs 12.7 (*)    Monocytes Absolute 1.3 (*)    All other components within normal limits  CULTURE, BLOOD (ROUTINE X 2)  CULTURE, BLOOD (ROUTINE X 2)  PROTIME-INR  INFLUENZA PANEL BY PCR (TYPE A & B)  URINALYSIS, ROUTINE W REFLEX MICROSCOPIC  I-STAT CG4 LACTIC ACID, ED  I-STAT CG4 LACTIC ACID, ED    EKG EKG Interpretation  Date/Time:  Saturday April 25 2018 22:58:09 EDT Ventricular Rate:  104 PR Interval:    QRS Duration: 101 QT Interval:  332 QTC Calculation: 437 R Axis:   18 Text Interpretation:  Sinus tachycardia Probable anteroseptal infarct, old Minimal ST depression, anterolateral leads Similar to prior Confirmed by Thayer Jew [phone]) on 04/25/2018 11:02:45 PM   Radiology Dg Chest 2 View  Result Date: 04/25/2018 CLINICAL DATA:  Acute onset of increased work of breathing. Abdominal bloating and fever. EXAM: CHEST - 2 VIEW COMPARISON:  Chest radiograph performed 01/17/2018 FINDINGS: The lungs are well-aerated. Emphysema is noted at the upper lung zones bilaterally, with underlying bibasilar scarring. Minimal right basilar opacity may reflect atelectasis or possibly mild pneumonia. There is no evidence of pleural effusion or pneumothorax. The heart is normal in size; the mediastinal contour is within normal limits. No acute osseous abnormalities are seen. IMPRESSION: 1. Minimal right basilar opacity may reflect atelectasis or possibly mild pneumonia. 2. Emphysema at the upper lung zones bilaterally, with underlying bibasilar scarring. Electronically Signed   By: Garald Balding M.D.   On: 04/25/2018 23:53    Procedures Procedures (including critical care time)  CRITICAL CARE Performed by: Merryl Hacker   Total critical care time: 30 minutes  Critical care time was exclusive of separately billable procedures and treating other patients.  Critical care was necessary to treat or prevent imminent or life-threatening deterioration.  Critical care was time spent personally by me on the following activities: development of treatment plan with patient and/or surrogate as well as nursing, discussions with consultants, evaluation of patient's response to treatment, examination of patient, obtaining history from patient or surrogate, ordering and performing treatments and interventions, ordering and review of laboratory studies, ordering and review of radiographic studies, pulse oximetry and re-evaluation of patient's condition.   Medications Ordered in ED Medications  ipratropium-albuterol (DUONEB) 0.5-2.5 (3) MG/3ML nebulizer solution 3 mL (has no administration in time range)  dexamethasone (DECADRON) injection 10 mg (10 mg Intravenous Given 04/26/18 0017)  levofloxacin (LEVAQUIN) tablet 500 mg (500 mg Oral Given 04/26/18 0108)  ipratropium-albuterol (DUONEB) 0.5-2.5 (3) MG/3ML nebulizer solution 3 mL (3 mLs Nebulization Given 04/26/18 0008)     Initial Impression / Assessment and Plan / ED Course  I have reviewed the triage vital signs and the nursing notes.  Pertinent labs & imaging results that were available during my care of the patient were reviewed by me and considered in my medical decision making (see chart for details).  Clinical Course as of Apr 26 228  Sun Apr 26, 2018  0213 On recheck, patient is resting.  He states he feels chilled again.  Feels that his breathing is not quite as good as when he came in.  He is now with more wheezing.  He was given Decadron.  Will ambulate.  Given his chronic respiratory failure and no pneumonia, feel it reasonable to  admit for Obs.  Influenza testing is pending.   [CH]    Clinical Course User Index [CH] Tychelle Purkey, Barbette Hair, MD    Patient  presents with fever, shortness of breath.  He is chronically ill-appearing.  He is satting low 90s on his home oxygen requirement.  He has diminished breath sounds in all lung fields.  Febrile.  Does not appear septic.  Lactate is 1.55.  He has a leukocytosis to 16.  While he states he feels improved after inhaler and is able to talk.  He does have increased respiratory rate in the 30s.  Patient was given a DuoNeb and Decadron.  He is on daily prednisone.  Chest x-ray is concerning for pneumonia.  Patient was given Levaquin.  Last hospitalization was in March.  On recheck, patient states he again feels chilled.  He is feels like his shortness of breath is worse.  He is mildly tachypneic but in no respiratory distress.  Repeat DuoNeb ordered.  Given his history of respiratory failure, will admit for likely COPD exacerbation in the setting of acute pneumonia.  Final Clinical Impressions(s) / ED Diagnoses   Final diagnoses:  Community acquired pneumonia of right lower lobe of lung ([place])  COPD exacerbation [facility])    ED Discharge Orders    None       Dina Rich, Barbette Hair, MD 04/26/18 [id number]

## 2018-04-27 ENCOUNTER — Inpatient Hospital Stay (HOSPITAL_COMMUNITY): Payer: Medicare Other

## 2018-04-27 DIAGNOSIS — J181 Lobar pneumonia, unspecified organism: Secondary | ICD-10-CM

## 2018-04-27 MED ORDER — ALBUTEROL SULFATE (2.5 MG/3ML) 0.083% IN NEBU
2.5000 mg | INHALATION_SOLUTION | RESPIRATORY_TRACT | Status: DC | PRN
  Administered 2018-04-27: 2.5 mg via RESPIRATORY_TRACT
  Filled 2018-04-27: qty 3

## 2018-04-27 NOTE — Progress Notes (Signed)
Physical Therapy Treatment Patient Details Name: Andrew [name] MRN: [id number] DOB: 05/31/1941 Today's Date: 04/27/2018    History of Present Illness Patient is a 77 y/o male presenting to ED on 04/26/18 due to shortness of breath. Admitted for sepsis secondary to HCAP and COPD exacerbation. Patient with a PMH significant for  home O2 dependent COPD, GERD, hypertension, hyperlipidemia, pulmonary hypertension and obstructive sleep apnea. Recent hospital admission in March for worsening dyspnea and COPD exacerbation.    PT Comments    Andrew [name] is a very pleasant 77 y/o male admitted with the above listed diagnosis. Prior to admission patient was independent with all mobility and had just initiated outpatient PT. Patient now limited primarily due to pulmonary status requiring Min guard for ambulation in hallways for very short distances. At rest SaO2 at or above 97% on 2L O2, with gait SaO2 dropping to 85% with 2L O2, requiring seated rest break with education on pursed lip breathing with good understanding. PT to continue to follow acutely to maximize functional mobility. Will recommend outpatient PT at this time.    Follow Up Recommendations  Outpatient PT     Equipment Recommendations  None recommended by PT    Recommendations for Other Services       Precautions / Restrictions Precautions Precautions: Fall Restrictions Weight Bearing Restrictions: No    Mobility  Bed Mobility Overal bed mobility: Modified Independent                Transfers Overall transfer level: Needs assistance Equipment used: None Transfers: Sit to/from Stand Sit to Stand: Supervision         General transfer comment: supervision for safety and immediate standing balance  Ambulation/Gait Ambulation/Gait assistance: Min guard Ambulation Distance (Feet): 75 Feet Assistive device: (pulled O2 canister) Gait Pattern/deviations: Step-through pattern;Decreased stride length;Trunk flexed;Narrow  base of support;Drifts right/left Gait velocity: decreased   General Gait Details: Use of 2L O2 with gait - sats dropping to 85%; reduced dynamic balance   Stairs             Wheelchair Mobility    Modified Rankin (Stroke Patients Only)       Balance Overall balance assessment: Needs assistance Sitting-balance support: Feet supported;No upper extremity supported Sitting balance-Leahy Scale: Good     Standing balance support: Single extremity supported;During functional activity Standing balance-Leahy Scale: Fair Standing balance comment: reduced dynamic balance                            Cognition Arousal/Alertness: Awake/alert Behavior During Therapy: WFL for tasks assessed/performed Overall Cognitive Status: Within Functional Limits for tasks assessed                                        Exercises      General Comments        Pertinent Vitals/Pain Pain Assessment: No/denies pain    Home Living Family/patient expects to be discharged to:: Private residence Living Arrangements: Children Available Help at Discharge: Family;Available 24 hours/day Type of Home: House Home Access: Level entry   Home Layout: One level Home Equipment: Shower seat      Prior Function Level of Independence: Independent      Comments: prior to admission in March went to [facility] 5-6x a week; recently started OP PT at Neuro location for general endurance/strength - wants to go to  pulmonary rehab   PT Goals (current goals can now be found in the care plan section) Acute Rehab PT Goals Patient Stated Goal: increase activity tolerance PT Goal Formulation: With patient Time For Goal Achievement: 05/11/18 Potential to Achieve Goals: Good    Frequency    Min 3X/week      PT Plan      Co-evaluation              AM-PAC PT "6 Clicks" Daily Activity  Outcome Measure  Difficulty turning over in bed (including adjusting bedclothes, sheets  and blankets)?: A Little Difficulty moving from lying on back to sitting on the side of the bed? : A Little Difficulty sitting down on and standing up from a chair with arms (e.g., wheelchair, bedside commode, etc,.)?: A Little Help needed moving to and from a bed to chair (including a wheelchair)?: A Little Help needed walking in hospital room?: A Little Help needed climbing 3-5 steps with a railing? : A Lot 6 Click Score: 17    End of Session Equipment Utilized During Treatment: Gait belt;Oxygen(2L O2) Activity Tolerance: Patient tolerated treatment well;Patient limited by fatigue;Other (comment)(O2 dropping to 85%) Patient left: in bed;with call bell/phone within reach Nurse Communication: Mobility status PT Visit Diagnosis: Unsteadiness on feet (R26.81);Other abnormalities of gait and mobility (R26.89)     Time: 7124-5809 PT Time Calculation (min) (ACUTE ONLY): 22 min  Charges:                       G Codes:       Andrew [name], PT, DPT 04/27/18 12:30 PM

## 2018-04-27 NOTE — Progress Notes (Signed)
Pt placed on Cpap tolerating well 3 lpm Oxygen bled in

## 2018-04-27 NOTE — Progress Notes (Signed)
PROGRESS NOTE    Andrew [name]  IOE:[id number] DOB: [birth year] DOA: 04/25/2018 PCP: Lujean Amel, MD  Brief Narrative: Andrew [name] is a 77 y.o. male with a known history of home O2 dependent COPD, GERD, hypertension, hyperlipidemia, pulmonary hypertension and obstructive sleep apnea presents to the emergency department for evaluation of shortness of breath.  Patient was in a usual state of health until this afternoon when he describes progressively worsening shortness of breath associated with fever to 102.3, worsening of his chronic productive cough. Symptoms were refractory to his home nebulizer treatment and Advair..  Patient denies weakness, dizziness, chest pain, N/V/C/D, abdominal pain, dysuria/frequency, changes in mental status.    He was hospitalized in March 19. Otherwise there has been no change in status. Patient has been taking medication as prescribed and there has been no recent change in medication or diet.  No recent antibiotics.  There has been no recent illness,  travel or sick contacts.     Assessment & Plan:   Active Problems:   COPD with emphysema ([place])   Chronic hypoxemic respiratory failure (HCC)   History of pulmonary hypertension   BPH (benign prostatic hyperplasia)   Hypertension   Hyperlipidemia   OSA and COPD overlap syndrome (HCC)   Paroxysmal atrial fibrillation ([place])   Sepsis due to pneumonia ([place])   1-Sepsis; Secondary to health care associated PNA;  Continue with IV fluids, IV antibiotics, Levaquin.  Follow blood culture; no growth to date. , sputum culture .  Wbc increases suspect related to steroids. He report more SOB today. Will repeat chest x ray.   2- Acute COPD exacerbation;  Schedule nebulizer. IV solumedrol.  Continue with Advair.  He uses CPAP at HS Continue with schedule nebulizer and IV solumedrol.   3-HTN; Paroxysmal A fib.   on Cardizem, metoprolol/   4-GERD; Prilosec.   BPH; Continue with Proscar and Flomax.   HLD;    On Crestor.   Pulmonary Hypertension;  Continue with sildenafil.    DVT prophylaxis; Eliquis  Code Status: Full code.  Family Communication: care discussed with patient.  Disposition Plan: home when stable.   Consultants:   none  Procedures: none  Antimicrobials: \ IV Levaquin 6-09  Subjective: He felt better yesterday.  He feels a little more SOB today.   Objective: Vitals:   04/27/18 1016 04/27/18 1118 04/27/18 1138 04/27/18 1316  BP: (!) 116/54  135/77 126/79  Pulse: 76   74  Resp:    19  Temp: 98.1 F (36.7 C)   97.6 F (36.4 C)  TempSrc: Oral   Axillary  SpO2: 98% 100%  98%  Weight:      Height:        Intake/Output Summary (Last 24 hours) at 04/27/2018 1525 Last data filed at 04/27/2018 1055 Gross per 24 hour  Intake 2608.75 ml  Output 1750 ml  Net 858.75 ml   Filed Weights   04/25/18 2303  Weight: 63.5 kg (140 lb)    Examination:  General exam: NAD Respiratory system: No wheezing, no crackles.  Cardiovascular system: S 1, S 2 RRR Gastrointestinal system: BS present, soft, nt Central nervous system: non focal.  Extremities: Symmetric power.  Skin: No rashes.     Data Reviewed: I have personally reviewed following labs and imaging studies  CBC: Recent Labs  Lab 04/25/18 2309 04/26/18 0534  WBC 15.4* 17.7*  NEUTROABS 12.7*  --   HGB 10.7* 11.1*  HCT 34.3* 36.2*  MCV 90.3 93.1  PLT 273 266  Basic Metabolic Panel: Recent Labs  Lab 04/25/18 2309 04/26/18 0534  NA 134* 139  K 3.4* 4.2  CL 99* 103  CO2 26 23  GLUCOSE 122* 136*  BUN 16 14  CREATININE 0.98 0.94  CALCIUM 8.9 9.4  MG  --  1.9  PHOS  --  3.8   GFR: Estimated Creatinine Clearance: 58.2 mL/min (by C-G formula based on SCr of 0.94 mg/dL). Liver Function Tests: Recent Labs  Lab 04/25/18 2309  AST 16  ALT 18  ALKPHOS 60  BILITOT 0.4  PROT 6.0*  ALBUMIN 3.6   No results for input(s): LIPASE, AMYLASE in the last 168 hours. No results for input(s): AMMONIA  in the last 168 hours. Coagulation Profile: Recent Labs  Lab 04/25/18 2309  INR 1.10   Cardiac Enzymes: No results for input(s): CKTOTAL, CKMB, CKMBINDEX, TROPONINI in the last 168 hours. BNP (last 3 results) No results for input(s): PROBNP in the last 8760 hours. HbA1C: No results for input(s): HGBA1C in the last 72 hours. CBG: No results for input(s): GLUCAP in the last 168 hours. Lipid Profile: No results for input(s): CHOL, HDL, LDLCALC, TRIG, CHOLHDL, LDLDIRECT in the last 72 hours. Thyroid Function Tests: No results for input(s): TSH, T4TOTAL, FREET4, T3FREE, THYROIDAB in the last 72 hours. Anemia Panel: No results for input(s): VITAMINB12, FOLATE, FERRITIN, TIBC, IRON, RETICCTPCT in the last 72 hours. Sepsis Labs: Recent Labs  Lab 04/25/18 2338 04/26/18 0340  LATICACIDVEN 1.55 1.00    Recent Results (from the past 240 hour(s))  Culture, blood (Routine x 2)     Status: None (Preliminary result)   Collection Time: 04/25/18 11:10 PM  Result Value Ref Range Status   Specimen Description BLOOD LEFT FOREARM  Final   Special Requests   Final    BOTTLES DRAWN AEROBIC AND ANAEROBIC Blood Culture adequate volume   Culture   Final    NO GROWTH 1 DAY Performed at [facility], [place] [address], [place], [place] [address]    Report Status PENDING  Incomplete  Culture, blood (Routine x 2)     Status: None (Preliminary result)   Collection Time: 04/26/18  3:21 AM  Result Value Ref Range Status   Specimen Description BLOOD LEFT ANTECUBITAL  Final   Special Requests   Final    BOTTLES DRAWN AEROBIC AND ANAEROBIC Blood Culture adequate volume   Culture   Final    NO GROWTH 1 DAY Performed at [facility], [place] [address] [place], [place] [address]    Report Status PENDING  Incomplete         Radiology Studies: Dg Chest 2 View  Result Date: 04/27/2018 CLINICAL DATA:  Shortness of breath. EXAM: CHEST - 2 VIEW COMPARISON:  04/25/2018 and 01/17/2018 FINDINGS:  There are severe emphysematous changes bilaterally as well as chronic interstitial changes at the lung bases. Heart size and pulmonary vascularity are normal. No acute infiltrates or effusions.  No significant bone abnormality. IMPRESSION: No acute abnormalities. Severe emphysematous changes with chronic interstitial disease at the bases, right greater than left. Electronically Signed   By: Lorriane Shire M.D.   On: 04/27/2018 13:00   Dg Chest 2 View  Result Date: 04/25/2018 CLINICAL DATA:  Acute onset of increased work of breathing. Abdominal bloating and fever. EXAM: CHEST - 2 VIEW COMPARISON:  Chest radiograph performed 01/17/2018 FINDINGS: The lungs are well-aerated. Emphysema is noted at the upper lung zones bilaterally, with underlying bibasilar scarring. Minimal right basilar opacity may  reflect atelectasis or possibly mild pneumonia. There is no evidence of pleural effusion or pneumothorax. The heart is normal in size; the mediastinal contour is within normal limits. No acute osseous abnormalities are seen. IMPRESSION: 1. Minimal right basilar opacity may reflect atelectasis or possibly mild pneumonia. 2. Emphysema at the upper lung zones bilaterally, with underlying bibasilar scarring. Electronically Signed   By: Garald Balding M.D.   On: 04/25/2018 23:53        Scheduled Meds: . apixaban  5 mg Oral BID  . diltiazem  180 mg Oral Daily  . finasteride  5 mg Oral QHS  . fluticasone  1 spray Each Nare QHS  . guaiFENesin  600 mg Oral BID  . ipratropium-albuterol  3 mL Nebulization TID  . methylPREDNISolone (SOLU-MEDROL) injection  60 mg Intravenous Q6H  . metoprolol tartrate  12.5 mg Oral BID  . mometasone-formoterol  2 puff Inhalation BID  . multivitamin with minerals  1 tablet Oral Daily  . pantoprazole  40 mg Oral Daily  . polyethylene glycol  17 g Oral QHS  . rosuvastatin  10 mg Oral Daily  . sildenafil  20 mg Oral BID  . tamsulosin  0.4 mg Oral QPC breakfast  . vitamin C  500 mg  Oral BID   Continuous Infusions: . levofloxacin (LEVAQUIN) IV Stopped (04/26/18 1927)     LOS: 1 day    Time spent: 35 minutes.     Elmarie Shiley, MD [facility] Pager [phone]  If 7PM-7AM, please contact night-coverage [email or web address] Password [email or web address] 04/27/2018, 3:25 PM

## 2018-04-28 ENCOUNTER — Ambulatory Visit: Payer: Medicare Other | Admitting: Physical Therapy

## 2018-04-28 LAB — BASIC METABOLIC PANEL
ANION GAP: 9 (ref 5–15)
BUN: 21 mg/dL — ABNORMAL HIGH (ref 6–20)
CHLORIDE: 104 mmol/L (ref 101–111)
CO2: 28 mmol/L (ref 22–32)
Calcium: 9.3 mg/dL (ref 8.9–10.3)
Creatinine, Ser: 0.89 mg/dL (ref 0.61–1.24)
GFR calc non Af Amer: >60 mL/min (ref >60)
Glucose, Bld: 146 mg/dL — ABNORMAL HIGH (ref 65–99)
POTASSIUM: 4.1 mmol/L (ref 3.5–5.1)
SODIUM: 141 mmol/L (ref 135–145)

## 2018-04-28 LAB — CBC
HEMATOCRIT: 32.9 % — AB (ref 39.0–52.0)
HEMOGLOBIN: 10.4 g/dL — AB (ref 13.0–17.0)
MCH: 28.6 pg (ref 26.0–34.0)
MCHC: 31.6 g/dL (ref 30.0–36.0)
MCV: 90.4 fL (ref 78.0–100.0)
Platelets: 273 10*3/uL (ref 150–400)
RBC: 3.64 MIL/uL — AB (ref 4.22–5.81)
RDW: 16.1 % — ABNORMAL HIGH (ref 11.5–15.5)
WBC: 14.8 10*3/uL — ABNORMAL HIGH (ref 4.0–10.5)

## 2018-04-28 MED ORDER — METHYLPREDNISOLONE SODIUM SUCC 125 MG IJ SOLR
60.0000 mg | Freq: Two times a day (BID) | INTRAMUSCULAR | Status: DC
  Administered 2018-04-28 – 2018-04-29 (×2): 60 mg via INTRAVENOUS
  Filled 2018-04-28 (×2): qty 2

## 2018-04-28 MED ORDER — LEVOFLOXACIN 750 MG PO TABS
750.0000 mg | ORAL_TABLET | ORAL | Status: DC
  Administered 2018-04-28 – 2018-04-29 (×2): 750 mg via ORAL
  Filled 2018-04-28 (×2): qty 1

## 2018-04-28 NOTE — Progress Notes (Signed)
Pt placed on CPAP for the night with 3lpm oxygen bled into system.  Pt tolerating well.  Pt aware of need to place nasal cannula if he decides to remove mask during the night.

## 2018-04-28 NOTE — Progress Notes (Signed)
Writer present with student nurse (ML) for all medication administrations between 7am-5pm and I concur with student nurse assessment. Communication maintained with staff nurse and is aware of medications given, vitals and assessment. Patient is alert and oriented and is currently sitting up in the bed with no complaints.

## 2018-04-28 NOTE — Progress Notes (Signed)
PROGRESS NOTE    Andrew [name]  HDQ:[id number] DOB: 1941/07/07 DOA: 04/25/2018 PCP: Lujean Amel, MD  Brief Narrative: Andrew [name] is a 77 y.o. male with a known history of home O2 dependent COPD, GERD, hypertension, hyperlipidemia, pulmonary hypertension and obstructive sleep apnea presents to the emergency department for evaluation of shortness of breath.  Patient was in a usual state of health until this afternoon when he describes progressively worsening shortness of breath associated with fever to 102.3, worsening of his chronic productive cough. Symptoms were refractory to his home nebulizer treatment and Advair..  Patient denies weakness, dizziness, chest pain, N/V/C/D, abdominal pain, dysuria/frequency, changes in mental status.    He was hospitalized in March 19. Otherwise there has been no change in status. Patient has been taking medication as prescribed and there has been no recent change in medication or diet.  No recent antibiotics.  There has been no recent illness,  travel or sick contacts.    Patient was admitted with COPD exacerbation. He has been improving on IV solumedrol and levaquin. Plan to transition to prednisone on 6-12  Assessment & Plan:   Active Problems:   COPD with emphysema ([place])   Chronic hypoxemic respiratory failure (HCC)   History of pulmonary hypertension   BPH (benign prostatic hyperplasia)   Hypertension   Hyperlipidemia   OSA and COPD overlap syndrome (HCC)   Paroxysmal atrial fibrillation ([place])   Sepsis due to pneumonia ([place])   1-Sepsis; Secondary to health care associated PNA;  Continue with  IV antibiotics, Levaquin.  Follow blood culture; no growth to date. , sputum culture .  Wbc increases suspect related to steroids. He report more SOB today. Chest x ray; no worsening finding.  Improved.   2- Acute COPD exacerbation;  Schedule nebulizer. IV solumedrol.  Continue with Advair.  He uses CPAP at HS Continue with schedule nebulizer  and IV solumedrol.  Plan to transition to oral prednisone 6-12.  3-HTN; Paroxysmal A fib.   on Cardizem, metoprolol/   4-GERD; Prilosec.   BPH; Continue with Proscar and Flomax.   HLD;  On Crestor.   Pulmonary Hypertension;  Continue with sildenafil.    DVT prophylaxis; Eliquis  Code Status: Full code.  Family Communication: care discussed with patient.  Disposition Plan: home when stable.   Consultants:   none  Procedures: none  Antimicrobials: \ IV Levaquin 6-09  Subjective: He is breathing better today. Cough persist    Objective: Vitals:   04/27/18 2320 04/28/18 0525 04/28/18 0802 04/28/18 0942  BP:  120/71  (!) 124/51  Pulse: 79 72  74  Resp: 18 19  18   Temp:  97.6 F (36.4 C)  98.2 F (36.8 C)  TempSrc:    Oral  SpO2: 96% 96% 96% 96%  Weight:      Height:        Intake/Output Summary (Last 24 hours) at 04/28/2018 1125 Last data filed at 04/28/2018 0925 Gross per 24 hour  Intake 490 ml  Output 890 ml  Net -400 ml   Filed Weights   04/25/18 2303  Weight: 63.5 kg (140 lb)    Examination:  General exam: NAD Respiratory system: normal respiratory effort. No wheezing.  Cardiovascular system: S 1, S 2 RRR Gastrointestinal system: BS present,, soft, nt Central nervous system: Non focal.  Extremities: symmetric power.  Skin: No rashes.     Data Reviewed: I have personally reviewed following labs and imaging studies  CBC: Recent Labs  Lab 04/25/18 2309 04/26/18  0534 04/28/18 0654  WBC 15.4* 17.7* 14.8*  NEUTROABS 12.7*  --   --   HGB 10.7* 11.1* 10.4*  HCT 34.3* 36.2* 32.9*  MCV 90.3 93.1 90.4  PLT 273 266 932   Basic Metabolic Panel: Recent Labs  Lab 04/25/18 2309 04/26/18 0534 04/28/18 0654  NA 134* 139 141  K 3.4* 4.2 4.1  CL 99* 103 104  CO2 26 23 28   GLUCOSE 122* 136* 146*  BUN 16 14 21*  CREATININE 0.98 0.94 0.89  CALCIUM 8.9 9.4 9.3  MG  --  1.9  --   PHOS  --  3.8  --    GFR: Estimated Creatinine Clearance:  61.4 mL/min (by C-G formula based on SCr of 0.89 mg/dL). Liver Function Tests: Recent Labs  Lab 04/25/18 2309  AST 16  ALT 18  ALKPHOS 60  BILITOT 0.4  PROT 6.0*  ALBUMIN 3.6   No results for input(s): LIPASE, AMYLASE in the last 168 hours. No results for input(s): AMMONIA in the last 168 hours. Coagulation Profile: Recent Labs  Lab 04/25/18 2309  INR 1.10   Cardiac Enzymes: No results for input(s): CKTOTAL, CKMB, CKMBINDEX, TROPONINI in the last 168 hours. BNP (last 3 results) No results for input(s): PROBNP in the last 8760 hours. HbA1C: No results for input(s): HGBA1C in the last 72 hours. CBG: No results for input(s): GLUCAP in the last 168 hours. Lipid Profile: No results for input(s): CHOL, HDL, LDLCALC, TRIG, CHOLHDL, LDLDIRECT in the last 72 hours. Thyroid Function Tests: No results for input(s): TSH, T4TOTAL, FREET4, T3FREE, THYROIDAB in the last 72 hours. Anemia Panel: No results for input(s): VITAMINB12, FOLATE, FERRITIN, TIBC, IRON, RETICCTPCT in the last 72 hours. Sepsis Labs: Recent Labs  Lab 04/25/18 2338 04/26/18 0340  LATICACIDVEN 1.55 1.00    Recent Results (from the past 240 hour(s))  Culture, blood (Routine x 2)     Status: None (Preliminary result)   Collection Time: 04/25/18 11:10 PM  Result Value Ref Range Status   Specimen Description BLOOD LEFT FOREARM  Final   Special Requests   Final    BOTTLES DRAWN AEROBIC AND ANAEROBIC Blood Culture adequate volume   Culture   Final    NO GROWTH 1 DAY Performed at [facility], [place] [address] [place], [place] [address]    Report Status PENDING  Incomplete  Culture, blood (Routine x 2)     Status: None (Preliminary result)   Collection Time: 04/26/18  3:21 AM  Result Value Ref Range Status   Specimen Description BLOOD LEFT ANTECUBITAL  Final   Special Requests   Final    BOTTLES DRAWN AEROBIC AND ANAEROBIC Blood Culture adequate volume   Culture   Final    NO GROWTH 1 DAY Performed at  [facility], [place] [address] [place], [place] [address]    Report Status PENDING  Incomplete         Radiology Studies: Dg Chest 2 View  Result Date: 04/27/2018 CLINICAL DATA:  Shortness of breath. EXAM: CHEST - 2 VIEW COMPARISON:  04/25/2018 and 01/17/2018 FINDINGS: There are severe emphysematous changes bilaterally as well as chronic interstitial changes at the lung bases. Heart size and pulmonary vascularity are normal. No acute infiltrates or effusions.  No significant bone abnormality. IMPRESSION: No acute abnormalities. Severe emphysematous changes with chronic interstitial disease at the bases, right greater than left. Electronically Signed   By: Lorriane Shire M.D.   On: 04/27/2018 13:00  Scheduled Meds: . apixaban  5 mg Oral BID  . diltiazem  180 mg Oral Daily  . finasteride  5 mg Oral QHS  . fluticasone  1 spray Each Nare QHS  . guaiFENesin  600 mg Oral BID  . ipratropium-albuterol  3 mL Nebulization TID  . levofloxacin  750 mg Oral Daily  . methylPREDNISolone (SOLU-MEDROL) injection  60 mg Intravenous Q12H  . metoprolol tartrate  12.5 mg Oral BID  . mometasone-formoterol  2 puff Inhalation BID  . multivitamin with minerals  1 tablet Oral Daily  . pantoprazole  40 mg Oral Daily  . polyethylene glycol  17 g Oral QHS  . rosuvastatin  10 mg Oral Daily  . sildenafil  20 mg Oral BID  . tamsulosin  0.4 mg Oral QPC breakfast  . vitamin C  500 mg Oral BID   Continuous Infusions:    LOS: 2 days    Time spent: 35 minutes.     Elmarie Shiley, MD [facility] Pager [phone]  If 7PM-7AM, please contact night-coverage [email or web address] Password [facility] 04/28/2018, 11:25 AM

## 2018-04-29 DIAGNOSIS — Z8679 Personal history of other diseases of the circulatory system: Secondary | ICD-10-CM

## 2018-04-29 DIAGNOSIS — A419 Sepsis, unspecified organism: Principal | ICD-10-CM

## 2018-04-29 DIAGNOSIS — I10 Essential (primary) hypertension: Secondary | ICD-10-CM

## 2018-04-29 DIAGNOSIS — E785 Hyperlipidemia, unspecified: Secondary | ICD-10-CM

## 2018-04-29 DIAGNOSIS — J9611 Chronic respiratory failure with hypoxia: Secondary | ICD-10-CM

## 2018-04-29 DIAGNOSIS — J441 Chronic obstructive pulmonary disease with (acute) exacerbation: Secondary | ICD-10-CM

## 2018-04-29 DIAGNOSIS — I480 Paroxysmal atrial fibrillation: Secondary | ICD-10-CM

## 2018-04-29 DIAGNOSIS — J189 Pneumonia, unspecified organism: Secondary | ICD-10-CM

## 2018-04-29 MED ORDER — METHYLPREDNISOLONE SODIUM SUCC 40 MG IJ SOLR
40.0000 mg | Freq: Two times a day (BID) | INTRAMUSCULAR | Status: DC
  Administered 2018-04-29 – 2018-04-30 (×2): 40 mg via INTRAVENOUS
  Filled 2018-04-29 (×2): qty 1

## 2018-04-29 NOTE — Care Management Important Message (Signed)
Important Message  Patient Details  Name: Andrew [name] MRN: [id number] Date of Birth: 1941-05-13   Medicare Important Message Given:  Yes    [name] 04/29/2018, 2:20 PM

## 2018-04-29 NOTE — Progress Notes (Signed)
PROGRESS NOTE   Andrew [name]  MHD:[id number]    DOB: [birth year]    DOA: 04/25/2018  PCP: Lujean Amel, MD   I have briefly reviewed patients previous medical records in [facility].  Brief Narrative:  77 year old male with PMH of COPD, pulmonary hypertension, chronic hypoxic respiratory failure on home oxygen 2 L/min during the day and 3 L/min at night, OSA on nightly CPAP, GERD, HTN, HLD presented to ED with progressive dyspnea, fever of 102.3 F and worsening of chronic productive cough refractory to home nebulizer treatment and Advair.  Last hospitalized February 03, 2018.  Admitted for COPD exacerbation and healthcare associated pneumonia.  Improving.   Assessment & Plan:   Active Problems:   COPD with emphysema ([place])   Chronic hypoxemic respiratory failure (HCC)   History of pulmonary hypertension   BPH (benign prostatic hyperplasia)   Hypertension   Hyperlipidemia   OSA and COPD overlap syndrome (HCC)   Paroxysmal atrial fibrillation (HCC)   Sepsis due to pneumonia [facility])   Healthcare associated pneumonia versus acute purulent bronchitis Initial chest x-ray 6/8: Minimal right basilar opacity: DD atelectasis or possible mild pneumonia. Repeat chest x-ray 6/10: No acute abnormalities. Blood cultures x2: Negative to date. Started empirically on IV levofloxacin.  Improving. Recommend follow-up chest x-ray in 4 weeks.  Sepsis Present on admission.  Secondary to bronchitis versus pneumonia.  Sepsis features resolved.  COPD exacerbation/chronic respiratory failure with hypoxia Precipitated by acute purulent bronchitis versus pneumonia. Treating with IV Solu-Medrol, bronchodilator nebulizations, oxygen supplements as needed, Advair and CPAP at bedtime. Continues to slowly improve.  Reduce IV Solu-Medrol today and transition to oral prednisone at possible discharge in a.m.  Essential hypertension Controlled.  Continue Cardizem and metoprolol.  Paroxysmal A. fib In sinus  rhythm today.  Continue metoprolol, Cardizem and Eliquis.  GERD: Stable.  Continue PPI.  BPH Stable without symptoms.  Continue Proscar and Flomax.  Hyperlipidemia Continue Crestor.  Pulmonary artery hypertension Continue sildenafil.  Normocytic anemia Possibly anemia of chronic disease.  Stable.  Hypokalemia Replaced.  Magnesium normal.  DVT prophylaxis: Eliquis Code Status: Full Family Communication: None at bedside Disposition: DC home pending further clinical improvement, possibly 6/13.   Consultants:  None  Procedures:  CPAP at bedtime  Antimicrobials:  Levofloxacin  Subjective: Indicates that he did not sleep well last night for no particular reason.  Dyspnea has gradually improved and states that it is "80% better".  Cough improved, minimal and nonproductive.  No chest pain reported.  Former smoker.  ROS: As above.  Objective:  Vitals:   04/29/18 0826 04/29/18 0832 04/29/18 1300 04/29/18 1440  BP:    118/61  Pulse:  64  61  Resp:    18  Temp:    98 F (36.7 C)  TempSrc:    Oral  SpO2: 94%  95% 94%  Weight:      Height:        Examination:  General exam: Pleasant elderly male, moderately built and nourished, lying comfortably propped up in bed without distress. Respiratory system: Distant breath sounds, occasional rhonchi but no crackles. Respiratory effort normal. Cardiovascular system: S1 & S2 heard, RRR. No JVD, murmurs, rubs, gallops or clicks. No pedal edema.  Telemetry personally reviewed: Sinus rhythm with occasional PACs and PVCs. Gastrointestinal system: Abdomen is nondistended, soft and nontender. No organomegaly or masses felt. Normal bowel sounds heard. Central nervous system: Alert and oriented. No focal neurological deficits. Extremities: Symmetric 5 x 5 power. Skin: No rashes, lesions or  ulcers Psychiatry: Judgement and insight appear normal. Mood & affect appropriate.     Data Reviewed: I have personally reviewed following labs  and imaging studies  CBC: Recent Labs  Lab 04/25/18 2309 04/26/18 0534 04/28/18 0654  WBC 15.4* 17.7* 14.8*  NEUTROABS 12.7*  --   --   HGB 10.7* 11.1* 10.4*  HCT 34.3* 36.2* 32.9*  MCV 90.3 93.1 90.4  PLT 273 266 308   Basic Metabolic Panel: Recent Labs  Lab 04/25/18 2309 04/26/18 0534 04/28/18 0654  NA 134* 139 141  K 3.4* 4.2 4.1  CL 99* 103 104  CO2 26 23 28   GLUCOSE 122* 136* 146*  BUN 16 14 21*  CREATININE 0.98 0.94 0.89  CALCIUM 8.9 9.4 9.3  MG  --  1.9  --   PHOS  --  3.8  --    Liver Function Tests: Recent Labs  Lab 04/25/18 2309  AST 16  ALT 18  ALKPHOS 60  BILITOT 0.4  PROT 6.0*  ALBUMIN 3.6   Coagulation Profile: Recent Labs  Lab 04/25/18 2309  INR 1.10     Recent Results (from the past 240 hour(s))  Culture, blood (Routine x 2)     Status: None (Preliminary result)   Collection Time: 04/25/18 11:10 PM  Result Value Ref Range Status   Specimen Description BLOOD LEFT FOREARM  Final   Special Requests   Final    BOTTLES DRAWN AEROBIC AND ANAEROBIC Blood Culture adequate volume   Culture   Final    NO GROWTH 3 DAYS Performed at [facility], [address], [place], [place] [address]    Report Status PENDING  Incomplete  Culture, blood (Routine x 2)     Status: None (Preliminary result)   Collection Time: 04/26/18  3:21 AM  Result Value Ref Range Status   Specimen Description BLOOD LEFT ANTECUBITAL  Final   Special Requests   Final    BOTTLES DRAWN AEROBIC AND ANAEROBIC Blood Culture adequate volume   Culture   Final    NO GROWTH 3 DAYS Performed at [facility], [place] [address], [place],  [address]    Report Status PENDING  Incomplete         Radiology Studies: No results found.      Scheduled Meds: . apixaban  5 mg Oral BID  . diltiazem  180 mg Oral Daily  . finasteride  5 mg Oral QHS  . fluticasone  1 spray Each Nare QHS  . guaiFENesin  600 mg Oral BID  . ipratropium-albuterol  3 mL Nebulization  TID  . levofloxacin  750 mg Oral Q24H  . methylPREDNISolone (SOLU-MEDROL) injection  60 mg Intravenous Q12H  . metoprolol tartrate  12.5 mg Oral BID  . mometasone-formoterol  2 puff Inhalation BID  . multivitamin with minerals  1 tablet Oral Daily  . pantoprazole  40 mg Oral Daily  . polyethylene glycol  17 g Oral QHS  . rosuvastatin  10 mg Oral Daily  . sildenafil  20 mg Oral BID  . tamsulosin  0.4 mg Oral QPC breakfast  . vitamin C  500 mg Oral BID   Continuous Infusions:   LOS: 3 days     Vernell Leep, MD, FACP, [facility]. [facility] Pager [phone]  If 7PM-7AM, please contact night-coverage [email or web address] Password [facility] 04/29/2018, 5:02 PM

## 2018-04-29 NOTE — Progress Notes (Signed)
Physical Therapy Treatment Patient Details Name: Andrew [name] MRN: [id number] DOB: 07-23-1941 Today's Date: 04/29/2018    History of Present Illness Patient is a 77 y/o male presenting to ED on 04/26/18 due to shortness of breath. Admitted for sepsis secondary to HCAP and COPD exacerbation. Patient with a PMH significant for  home O2 dependent COPD, GERD, hypertension, hyperlipidemia, pulmonary hypertension and obstructive sleep apnea. Recent hospital admission in March for worsening dyspnea and COPD exacerbation.    PT Comments    Andrew [name] doing well - feels like "today isn't as good as yesterday" due to poor sleep/rest last night. Motivated to work with PT. Education on pursed lip breathing during activity and during recovery with good understanding and demonstration with O2 levels remaining higher today than during previous sessions. Making good progress towards goals. PT to continue to acutely follow.     Follow Up Recommendations  Outpatient PT     Equipment Recommendations  None recommended by PT    Recommendations for Other Services       Precautions / Restrictions Precautions Precautions: Fall Restrictions Weight Bearing Restrictions: No    Mobility  Bed Mobility Overal bed mobility: Modified Independent                Transfers Overall transfer level: Needs assistance Equipment used: None Transfers: Sit to/from Stand Sit to Stand: Supervision;Modified independent (Device/Increase time)         General transfer comment: light SUP for safety  Ambulation/Gait Ambulation/Gait assistance: Min guard;Supervision Ambulation Distance (Feet): 100 Feet Assistive device: (pulled O2 carrier) Gait Pattern/deviations: Step-through pattern;Decreased stride length;Trunk flexed;Narrow base of support;Drifts right/left Gait velocity: decreased   General Gait Details: continues to have reduced dynamic balance with some drifting adn near scissoring gait pattern, but  able to recover without LOB    Stairs             Wheelchair Mobility    Modified Rankin (Stroke Patients Only)       Balance Overall balance assessment: Needs assistance Sitting-balance support: Feet supported;No upper extremity supported Sitting balance-Leahy Scale: Good     Standing balance support: Single extremity supported;During functional activity Standing balance-Leahy Scale: Fair Standing balance comment: continued slight sway with dynamic balance                            Cognition Arousal/Alertness: Awake/alert Behavior During Therapy: WFL for tasks assessed/performed Overall Cognitive Status: Within Functional Limits for tasks assessed                                        Exercises      General Comments        Pertinent Vitals/Pain Pain Assessment: No/denies pain    Home Living                      Prior Function            PT Goals (current goals can now be found in the care plan section) Acute Rehab PT Goals Patient Stated Goal: increase activity tolerance PT Goal Formulation: With patient Time For Goal Achievement: 05/11/18 Potential to Achieve Goals: Good Progress towards PT goals: Progressing toward goals    Frequency    Min 3X/week      PT Plan Current plan remains appropriate    Co-evaluation  AM-PAC PT "6 Clicks" Daily Activity  Outcome Measure  Difficulty turning over in bed (including adjusting bedclothes, sheets and blankets)?: A Little Difficulty moving from lying on back to sitting on the side of the bed? : A Little Difficulty sitting down on and standing up from a chair with arms (e.g., wheelchair, bedside commode, etc,.)?: A Little Help needed moving to and from a bed to chair (including a wheelchair)?: A Little Help needed walking in hospital room?: A Little Help needed climbing 3-5 steps with a railing? : A Little 6 Click Score: 18    End of  Session Equipment Utilized During Treatment: Gait belt;Oxygen(2L) Activity Tolerance: Patient tolerated treatment well;Patient limited by fatigue;Other (comment)(SaO2 dropping to 87% on 2L O2 - approx 2-3 min to recover) Patient left: in bed;with call bell/phone within reach;with bed alarm set Nurse Communication: Mobility status PT Visit Diagnosis: Unsteadiness on feet (R26.81);Other abnormalities of gait and mobility (R26.89)     Time: 0569-7948 PT Time Calculation (min) (ACUTE ONLY): 18 min  Charges:  $Gait Training: 8-22 mins                    G Codes:       Andrew [name], PT, DPT 04/29/18 12:15 PM

## 2018-04-29 NOTE — Progress Notes (Signed)
ANTIBIOTIC CONSULT NOTE  Pharmacy Consult for Levaquin Indication: COPD/PNA  No Known Allergies  Patient Measurements: Height: 5\' 7"  (170.2 cm) Weight: 140 lb (63.5 kg) IBW/kg (Calculated) : 66.1 Adjusted Body Weight:    Vital Signs: Temp: 97.4 F (36.3 C) (06/12 0601) Temp Source: Axillary (06/12 0601) BP: 140/72 (06/12 0817) Pulse Rate: 64 (06/12 0832) Intake/Output from previous day: 06/11 0701 - 06/12 0700 In: 600 [P.O.:600] Out: 820 [Urine:820] Intake/Output from this shift: Total I/O In: 584 [P.O.:584] Out: -   Labs: Recent Labs    04/28/18 0654  WBC 14.8*  HGB 10.4*  PLT 273  CREATININE 0.89   Estimated Creatinine Clearance: 61.4 mL/min (by C-G formula based on SCr of 0.89 mg/dL). No results for input(s): VANCOTROUGH, VANCOPEAK, VANCORANDOM, GENTTROUGH, GENTPEAK, GENTRANDOM, TOBRATROUGH, TOBRAPEAK, TOBRARND, AMIKACINPEAK, AMIKACINTROU, AMIKACIN in the last 72 hours.   Microbiology:   Medical History: Past Medical History:  Diagnosis Date  . Anemia   . BiPAP (biphasic positive airway pressure) dependence    Pt denies history of OSA  . BPH (benign prostatic hyperplasia)   . Cancer ([place])    skin  . COPD (chronic obstructive pulmonary disease) ([place])   . Dysphagia   . Emphysema lung ([place])   . GERD (gastroesophageal reflux disease)    " Silent reflux"  . Hearing loss    right ear  . History of hiatal hernia   . HLD (hyperlipidemia)   . Hypertension   . Oxygen dependent    2-3 liters  . Oxygen dependent   . Pneumonia   . Pulmonary hypertension ([place])   . Sleep apnea    wears cpap    Assessment: ID: CAP vs COPD exacerbation- WBC up 14.8 (on steroids, tapering), afebrile. Scr WNL and CrCl 61. CXR- minimal R-basilar opacity  Levaquin 6/9 >>  6/9 Blood >> ngtd 6/9 Sputum >>  Goal of Therapy:  Eradication of infection  Plan:  Levaquin 750mg  IV q 24hrs day # 4  Amit Leece S. Alford Highland, PharmD, BCPS Clinical Staff Pharmacist Pager  [phone]  [place], [place] 04/29/2018,1:01 PM

## 2018-04-29 NOTE — Progress Notes (Signed)
RT placed patient on CPAP of 7 HS. 3L O2 Bleed in needed. Patient tolerating well at this time.

## 2018-04-29 NOTE — Consult Note (Signed)
   [facility] CM Inpatient Consult   04/29/2018  Dollie Mayse [birth year] [id number]   Patient screened for potential [place] [facility] services. Patient is in the [place] of the [place] [facility] services under patient's Medicare plan.  Patient's primary care provider, Dr. Lujean Amel is listed to provide the transition of care follow up calls and follow up. For questions contact:   Natividad Brood, RN BSN [facility] Liaison  [phone] business mobile phone Toll free office [phone]

## 2018-04-30 ENCOUNTER — Ambulatory Visit: Payer: Medicare Other | Admitting: Physical Therapy

## 2018-04-30 DIAGNOSIS — G4733 Obstructive sleep apnea (adult) (pediatric): Secondary | ICD-10-CM

## 2018-04-30 DIAGNOSIS — J449 Chronic obstructive pulmonary disease, unspecified: Secondary | ICD-10-CM

## 2018-04-30 MED ORDER — PREDNISONE 10 MG PO TABS: ORAL_TABLET | ORAL | 0 refills | Status: DC

## 2018-04-30 NOTE — Discharge Summary (Signed)
Physician Discharge Summary  Oluwatomiwa Kinyon WOE:[id number] DOB: 1941/08/09  PCP: Lujean Amel, MD  Admit date: 04/25/2018 Discharge date: 04/30/2018  Recommendations for Outpatient Follow-up:  1. Dr. Lujean Amel, PCP in 1 week with repeat labs (CBC & BMP). 2. Recommend repeating chest x-ray in 4 weeks. 3. Dr. Teressa Lower, Pulmonology in 1 week.  Home Health: None Equipment/Devices: Continue prior home oxygen at 2 L/min during the day and 3 L/min at night and CPAP at bedtime.  Discharge Condition: Improved and stable. CODE STATUS: Full Diet recommendation: Heart healthy diet.  Discharge Diagnoses:  Active Problems:   COPD with emphysema (HCC)   Chronic hypoxemic respiratory failure (HCC)   History of pulmonary hypertension   BPH (benign prostatic hyperplasia)   Hypertension   Hyperlipidemia   OSA and COPD overlap syndrome (HCC)   Paroxysmal atrial fibrillation (HCC)   Sepsis due to pneumonia [facility])   Brief Summary: 77 year old male with PMH of COPD, pulmonary hypertension, chronic hypoxic respiratory failure on home oxygen 2 L/min during the day and 3 L/min at night, OSA on nightly CPAP, GERD, HTN, HLD presented to ED with progressive dyspnea, fever of 102.3 F and worsening of chronic productive cough refractory to home nebulizer treatment and Advair.  Last hospitalized February 03, 2018.  Admitted for COPD exacerbation and healthcare associated pneumonia.  Improving.   Assessment & Plan:  Healthcare associated pneumonia versus acute purulent bronchitis Initial chest x-ray 6/8: Minimal right basilar opacity: DD atelectasis or possible mild pneumonia. Repeat chest x-ray 6/10: No acute abnormalities.  It is unusual for chest x-ray abnormalities to resolve within 48 hours.  Thereby wonder if his presentation was more due to acute purulent bronchitis rather than pneumonia. Blood cultures x2: Negative to date. Patient completed total 5 days of levofloxacin and no further antibiotics  at discharge.  Clinically improved. Recommend follow-up chest x-ray in 4 weeks.  Sepsis Present on admission.  Secondary to bronchitis versus pneumonia.  Sepsis features resolved.  COPD exacerbation/chronic respiratory failure with hypoxia/OSA on nightly CPAP. Precipitated by acute purulent bronchitis versus pneumonia. Treating with IV Solu-Medrol, bronchodilator nebulizations, oxygen supplements as needed, Advair and CPAP at bedtime. Improved.  Transitioned to oral prednisone taper at discharge. Of note patient is steroid and oxygen dependent.  He is advised to continue his oral prednisone 10 mg daily after completion of taper. Also patient states that he has been on Spiriva and DuoNeb for many years.  Advised him that there is duplication of medications.  He is advised to follow-up with his pulmonologist to see if 1 of them can be discontinued.  He verbalized understanding.  Essential hypertension Controlled.  Continue Cardizem and metoprolol.  Paroxysmal A. fib In sinus rhythm today.  Continue metoprolol, Cardizem and Eliquis.  GERD: Stable.  Continue PPI.  BPH Stable without symptoms.  Continue Proscar and Flomax.  Hyperlipidemia Continue Crestor.  Pulmonary artery hypertension Continue sildenafil.  Normocytic anemia Possibly anemia of chronic disease.  Stable.  Hypokalemia Replaced.  Magnesium normal.    Consultants:  None  Procedures:  CPAP at bedtime   Discharge Instructions  Discharge Instructions    Call MD for:  difficulty breathing, headache or visual disturbances   Complete by:  As directed    Call MD for:  extreme fatigue   Complete by:  As directed    Call MD for:  persistant dizziness or light-headedness   Complete by:  As directed    Call MD for:  temperature >100.4   Complete by:  As  directed    Diet - low sodium heart healthy   Complete by:  As directed    Increase activity slowly   Complete by:  As directed         Medication List    TAKE these medications   ADVAIR DISKUS 250-50 MCG/DOSE Aepb Generic drug:  Fluticasone-Salmeterol Inhale 1 puff into the lungs 2 (two) times daily.   albuterol 108 (90 Base) MCG/ACT inhaler Commonly known as:  PROVENTIL HFA;VENTOLIN HFA Inhale 2 puffs into the lungs every 6 (six) hours as needed for wheezing or shortness of breath.   apixaban 5 MG Tabs tablet Commonly known as:  ELIQUIS Take 1 tablet (5 mg total) by mouth 2 (two) times daily.   benzonatate 100 MG capsule Commonly known as:  TESSALON Take 1 capsule (100 mg total) by mouth 3 (three) times daily as needed for cough.   diltiazem 180 MG 24 hr capsule Commonly known as:  CARDIZEM CD Take 1 capsule (180 mg total) by mouth daily.   finasteride 5 MG tablet Commonly known as:  PROSCAR Take 5 mg by mouth at bedtime.   fluticasone 50 MCG/ACT nasal spray Commonly known as:  FLONASE Place 1 spray into both nostrils at bedtime.   guaiFENesin 600 MG 12 hr tablet Commonly known as:  MUCINEX Take 1 tablet (600 mg total) by mouth 2 (two) times daily.   hydrocortisone cream 1 % Apply 1 application topically daily as needed (Eczema on face and head).   ipratropium-albuterol 0.5-2.5 (3) MG/3ML Soln Commonly known as:  DUONEB Take 3 mLs by nebulization 3 (three) times daily.   liver oil-zinc oxide 40 % ointment Commonly known as:  DESITIN Apply 1 application topically daily.   metoprolol tartrate 25 MG tablet Commonly known as:  LOPRESSOR Take 12.5 mg by mouth 2 (two) times daily.   multivitamin with minerals tablet Take 1 tablet by mouth daily.   omeprazole 40 MG capsule Commonly known as:  PRILOSEC Take 40 mg by mouth 2 (two) times daily.   OXYGEN Inhale 2-3 L into the lungs See admin instructions. 2L daytime, 3L at night   polyethylene glycol packet Commonly known as:  MIRALAX / GLYCOLAX Take 17 g by mouth daily. What changed:    when to take this  additional instructions    predniSONE 10 MG tablet Commonly known as:  DELTASONE Take 4 tabs daily for 3 days, then 3 tabs daily for 3 days, then 2 tabs daily for 3 days, then continue 1 tab daily maintenance as you were doing before. What changed:    how much to take  how to take this  when to take this  additional instructions   rosuvastatin 10 MG tablet Commonly known as:  CRESTOR Take 10 mg by mouth daily.   sildenafil 20 MG tablet Commonly known as:  REVATIO Take 1 tablet (20 mg total) by mouth 2 (two) times daily.   SPIRIVA HANDIHALER 18 MCG inhalation capsule Generic drug:  tiotropium INHALE THE CONTENTS OF 1 CAPSULE EVERY DAY   tamsulosin 0.4 MG Caps capsule Commonly known as:  FLOMAX Take 1 capsule (0.4 mg total) by mouth daily after breakfast.   temazepam 30 MG capsule Commonly known as:  RESTORIL TAKE ONE CAPSULE BY MOUTH EVERY NIGHT AT BEDTIME   vitamin C 500 MG tablet Commonly known as:  ASCORBIC ACID Take 500 mg by mouth 2 (two) times daily.      Follow-up Information    Koirala, D[place]as, MD. Schedule an  appointment as soon as possible for a visit in 1 week(s).   Specialty:  Family Medicine Why:  To be seen with repeat labs (CBC & BMP).  Recommend repeating chest x-ray in 4 weeks. Contact information: [place] [address] [phone]        Noralee Space, MD. Schedule an appointment as soon as possible for a visit in 1 week(s).   Specialty:  Pulmonary Disease Contact information: [place] [address] [phone]          No Known Allergies    Procedures/Studies: Dg Chest 2 View  Result Date: 04/27/2018 CLINICAL DATA:  Shortness of breath. EXAM: CHEST - 2 VIEW COMPARISON:  04/25/2018 and 01/17/2018 FINDINGS: There are severe emphysematous changes bilaterally as well as chronic interstitial changes at the lung bases. Heart size and pulmonary vascularity are normal. No acute infiltrates or effusions.  No  significant bone abnormality. IMPRESSION: No acute abnormalities. Severe emphysematous changes with chronic interstitial disease at the bases, right greater than left. Electronically Signed   By: Lorriane Shire M.D.   On: 04/27/2018 13:00   Dg Chest 2 View  Result Date: 04/25/2018 CLINICAL DATA:  Acute onset of increased work of breathing. Abdominal bloating and fever. EXAM: CHEST - 2 VIEW COMPARISON:  Chest radiograph performed 01/17/2018 FINDINGS: The lungs are well-aerated. Emphysema is noted at the upper lung zones bilaterally, with underlying bibasilar scarring. Minimal right basilar opacity may reflect atelectasis or possibly mild pneumonia. There is no evidence of pleural effusion or pneumothorax. The heart is normal in size; the mediastinal contour is within normal limits. No acute osseous abnormalities are seen. IMPRESSION: 1. Minimal right basilar opacity may reflect atelectasis or possibly mild pneumonia. 2. Emphysema at the upper lung zones bilaterally, with underlying bibasilar scarring. Electronically Signed   By: Garald Balding M.D.   On: 04/25/2018 23:53      Subjective: Patient continues to feel better.  Indicates that his dyspnea has continued to improve.  Minimal intermittent dry cough.  No chest pain.  Denies any other complaints.  As per RN, no acute issues noted.  Has been using flutter valve regularly.  Discharge Exam:  Vitals:   04/29/18 2315 04/30/18 0508 04/30/18 0850 04/30/18 0851  BP:  126/73 129/79 129/79  Pulse: 65 67  61  Resp: 16 12    Temp:  97.6 F (36.4 C)    TempSrc:  Oral    SpO2: 98% 90%    Weight:      Height:        General exam: Pleasant elderly male, moderately built and nourished, lying comfortably propped up in bed without distress. Respiratory system:  Distant breath sounds but clear to auscultation without wheezing, rhonchi or crackles. Respiratory effort normal. Cardiovascular system: S1 & S2 heard, RRR. No JVD, murmurs, rubs, gallops or  clicks. No pedal edema.    Telemetry personally reviewed: Sinus rhythm with occasional PVCs. Gastrointestinal system: Abdomen is nondistended, soft and nontender. No organomegaly or masses felt. Normal bowel sounds heard. Central nervous system: Alert and oriented. No focal neurological deficits. Extremities: Symmetric 5 x 5 power. Skin: No rashes, lesions or ulcers Psychiatry: Judgement and insight appear normal. Mood & affect appropriate.       The results of significant diagnostics from this hospitalization (including imaging, microbiology, ancillary and laboratory) are listed below for reference.     Microbiology: Recent Results (from the past 240 hour(s))  Culture, blood (Routine x 2)  Status: None (Preliminary result)   Collection Time: 04/25/18 11:10 PM  Result Value Ref Range Status   Specimen Description BLOOD LEFT FOREARM  Final   Special Requests   Final    BOTTLES DRAWN AEROBIC AND ANAEROBIC Blood Culture adequate volume   Culture   Final    NO GROWTH 3 DAYS Performed at [facility], [address], [place], [place] [address]    Report Status PENDING  Incomplete  Culture, blood (Routine x 2)     Status: None (Preliminary result)   Collection Time: 04/26/18  3:21 AM  Result Value Ref Range Status   Specimen Description BLOOD LEFT ANTECUBITAL  Final   Special Requests   Final    BOTTLES DRAWN AEROBIC AND ANAEROBIC Blood Culture adequate volume   Culture   Final    NO GROWTH 3 DAYS Performed at [facility], [address] [place], [place] [address]    Report Status PENDING  Incomplete     Labs: CBC: Recent Labs  Lab 04/25/18 2309 04/26/18 0534 04/28/18 0654  WBC 15.4* 17.7* 14.8*  NEUTROABS 12.7*  --   --   HGB 10.7* 11.1* 10.4*  HCT 34.3* 36.2* 32.9*  MCV 90.3 93.1 90.4  PLT 273 266 191   Basic Metabolic Panel: Recent Labs  Lab 04/25/18 2309 04/26/18 0534 04/28/18 0654  NA 134* 139 141  K 3.4* 4.2 4.1  CL 99* 103 104  CO2 26  23 28   GLUCOSE 122* 136* 146*  BUN 16 14 21*  CREATININE 0.98 0.94 0.89  CALCIUM 8.9 9.4 9.3  MG  --  1.9  --   PHOS  --  3.8  --    Liver Function Tests: Recent Labs  Lab 04/25/18 2309  AST 16  ALT 18  ALKPHOS 60  BILITOT 0.4  PROT 6.0*  ALBUMIN 3.6   Urinalysis    Component Value Date/Time   COLORURINE STRAW (A) 04/26/2018 0330   APPEARANCEUR CLEAR 04/26/2018 0330   LABSPEC 1.008 04/26/2018 0330   PHURINE 6.0 04/26/2018 0330   GLUCOSEU NEGATIVE 04/26/2018 0330   HGBUR NEGATIVE 04/26/2018 0330   BILIRUBINUR NEGATIVE 04/26/2018 0330   KETONESUR 5 (A) 04/26/2018 0330   PROTEINUR NEGATIVE 04/26/2018 0330   NITRITE NEGATIVE 04/26/2018 0330   LEUKOCYTESUR NEGATIVE 04/26/2018 0330      Time coordinating discharge: 25 minutes  SIGNED:  Vernell Leep, MD, FACP, [facility]. [facility] Pager [phone]  If 7PM-7AM, please contact night-coverage [email or web address] Password [email or web address] 04/30/2018, 1:25 PM

## 2018-04-30 NOTE — Care Management Note (Addendum)
Case Management Note  Patient Details  Name: Andrew [name] MRN: [id number] Date of Birth: [birth year]  Subjective/Objective:        Sepsis/PNA           Andrew [name] (Daughter)     [phone]       PCP: Andrew [name]  Action/Plan: Discharge to home with family.  Expected Discharge Date:  04/30/18               Expected Discharge Plan:  Home/Self Care  In-House Referral:     Discharge planning Services     Post Acute Care Choice:    Choice offered to:     DME Arranged:   N/A DME Agency:   N/A  HH Arranged:   N/A HH Agency:   N/A  Status of Service:  Completed, signed off  If discussed at [place] of Stay Meetings, dates discussed:    Additional Comments:  Sharin Mons, RN 04/30/2018, 3:50 PM

## 2018-04-30 NOTE — Progress Notes (Signed)
Nsg Discharge Note  Admit Date:  04/25/2018 Discharge date: 04/30/2018   [name] to be D/C'd Home per MD order.  AVS completed.  Copy for chart, and copy for patient signed, and dated. Patient/caregiver able to verbalize understanding.  Discharge Medication: Allergies as of 04/30/2018   No Known Allergies     Medication List    TAKE these medications   ADVAIR DISKUS 250-50 MCG/DOSE Aepb Generic drug:  Fluticasone-Salmeterol Inhale 1 puff into the lungs 2 (two) times daily.   albuterol 108 (90 Base) MCG/ACT inhaler Commonly known as:  PROVENTIL HFA;VENTOLIN HFA Inhale 2 puffs into the lungs every 6 (six) hours as needed for wheezing or shortness of breath.   apixaban 5 MG Tabs tablet Commonly known as:  ELIQUIS Take 1 tablet (5 mg total) by mouth 2 (two) times daily.   benzonatate 100 MG capsule Commonly known as:  TESSALON Take 1 capsule (100 mg total) by mouth 3 (three) times daily as needed for cough.   diltiazem 180 MG 24 hr capsule Commonly known as:  CARDIZEM CD Take 1 capsule (180 mg total) by mouth daily.   finasteride 5 MG tablet Commonly known as:  PROSCAR Take 5 mg by mouth at bedtime.   fluticasone 50 MCG/ACT nasal spray Commonly known as:  FLONASE Place 1 spray into both nostrils at bedtime.   guaiFENesin 600 MG 12 hr tablet Commonly known as:  MUCINEX Take 1 tablet (600 mg total) by mouth 2 (two) times daily.   hydrocortisone cream 1 % Apply 1 application topically daily as needed (Eczema on face and head).   ipratropium-albuterol 0.5-2.5 (3) MG/3ML Soln Commonly known as:  DUONEB Take 3 mLs by nebulization 3 (three) times daily.   liver oil-zinc oxide 40 % ointment Commonly known as:  DESITIN Apply 1 application topically daily.   metoprolol tartrate 25 MG tablet Commonly known as:  LOPRESSOR Take 12.5 mg by mouth 2 (two) times daily.   multivitamin with minerals tablet Take 1 tablet by mouth daily.   omeprazole 40 MG capsule Commonly  known as:  PRILOSEC Take 40 mg by mouth 2 (two) times daily.   OXYGEN Inhale 2-3 L into the lungs See admin instructions. 2L daytime, 3L at night   polyethylene glycol packet Commonly known as:  MIRALAX / GLYCOLAX Take 17 g by mouth daily. What changed:    when to take this  additional instructions   predniSONE 10 MG tablet Commonly known as:  DELTASONE Take 4 tabs daily for 3 days, then 3 tabs daily for 3 days, then 2 tabs daily for 3 days, then continue 1 tab daily maintenance as you were doing before. What changed:    how much to take  how to take this  when to take this  additional instructions   rosuvastatin 10 MG tablet Commonly known as:  CRESTOR Take 10 mg by mouth daily.   sildenafil 20 MG tablet Commonly known as:  REVATIO Take 1 tablet (20 mg total) by mouth 2 (two) times daily.   SPIRIVA HANDIHALER 18 MCG inhalation capsule Generic drug:  tiotropium INHALE THE CONTENTS OF 1 CAPSULE EVERY DAY   tamsulosin 0.4 MG Caps capsule Commonly known as:  FLOMAX Take 1 capsule (0.4 mg total) by mouth daily after breakfast.   temazepam 30 MG capsule Commonly known as:  RESTORIL TAKE ONE CAPSULE BY MOUTH EVERY NIGHT AT BEDTIME   vitamin C 500 MG tablet Commonly known as:  ASCORBIC ACID Take 500 mg by mouth  2 (two) times daily.       Discharge Assessment: Vitals:   04/30/18 1328 04/30/18 1350  BP: 121/64 126/62  Pulse: 66 66  Resp: 14 15  Temp: 97.9 F (36.6 C) 98.1 F (36.7 C)  SpO2: 97% 94%   Skin clean, dry and intact without evidence of skin break down, no evidence of skin tears noted. IV catheter discontinued intact. Site without signs and symptoms of complications - no redness or edema noted at insertion site, patient denies c/o pain - only slight tenderness at site.  Dressing with slight pressure applied.  D/c Instructions-Education: Discharge instructions given to patient/family with verbalized understanding. D/c education completed with  patient/family including follow up instructions, medication list, d/c activities limitations if indicated, with other d/c instructions as indicated by MD - patient able to verbalize understanding, all questions fully answered. Patient instructed to return to ED, call 911, or call MD for any changes in condition.  Patient escorted via [place], and D/C home via private auto.  Reg Bircher Margaretha Sheffield, RN 04/30/2018 3:29 PM

## 2018-05-01 ENCOUNTER — Other Ambulatory Visit: Payer: Self-pay | Admitting: Pulmonary Disease

## 2018-05-01 ENCOUNTER — Telehealth: Payer: Self-pay | Admitting: Pulmonary Disease

## 2018-05-01 DIAGNOSIS — J432 Centrilobular emphysema: Secondary | ICD-10-CM

## 2018-05-01 DIAGNOSIS — D649 Anemia, unspecified: Secondary | ICD-10-CM

## 2018-05-01 LAB — CULTURE, BLOOD (ROUTINE X 2)
CULTURE: NO GROWTH
Culture: NO GROWTH
SPECIAL REQUESTS: ADEQUATE
Special Requests: ADEQUATE

## 2018-05-01 NOTE — Telephone Encounter (Signed)
Pt is scheduled to see SN for a HFU 6/19 at 11am.  Attempted to call pt but unable to reach him.  Left message for pt to return call x1.

## 2018-05-01 NOTE — Telephone Encounter (Signed)
Per SN- CXR, CBC, CMP,and Iron panel order for hospital follow up appointment.  Patient notified and stated understanding.  Nothing further at this time.

## 2018-05-04 ENCOUNTER — Ambulatory Visit: Payer: Medicare Other | Admitting: Physical Therapy

## 2018-05-05 ENCOUNTER — Ambulatory Visit: Payer: Medicare Other | Admitting: Physical Therapy

## 2018-05-05 DIAGNOSIS — I482 Chronic atrial fibrillation: Secondary | ICD-10-CM | POA: Diagnosis not present

## 2018-05-05 DIAGNOSIS — D500 Iron deficiency anemia secondary to blood loss (chronic): Secondary | ICD-10-CM | POA: Diagnosis not present

## 2018-05-05 DIAGNOSIS — J439 Emphysema, unspecified: Secondary | ICD-10-CM | POA: Diagnosis not present

## 2018-05-06 ENCOUNTER — Other Ambulatory Visit (INDEPENDENT_AMBULATORY_CARE_PROVIDER_SITE_OTHER): Payer: Medicare Other

## 2018-05-06 ENCOUNTER — Encounter: Payer: Self-pay | Admitting: Pulmonary Disease

## 2018-05-06 ENCOUNTER — Ambulatory Visit (INDEPENDENT_AMBULATORY_CARE_PROVIDER_SITE_OTHER)
Admission: RE | Admit: 2018-05-06 | Discharge: 2018-05-06 | Disposition: A | Discharge status: Home / Self Care | Payer: Medicare Other | Source: Ambulatory Visit | Attending: Pulmonary Disease | Admitting: Pulmonary Disease

## 2018-05-06 ENCOUNTER — Ambulatory Visit (INDEPENDENT_AMBULATORY_CARE_PROVIDER_SITE_OTHER): Payer: Medicare Other | Admitting: Pulmonary Disease

## 2018-05-06 VITALS — BP 108/60 | HR 64 | Temp 97.8°F | Ht 67.0 in | Wt 137.0 lb

## 2018-05-06 DIAGNOSIS — Z9989 Dependence on other enabling machines and devices: Secondary | ICD-10-CM

## 2018-05-06 DIAGNOSIS — I480 Paroxysmal atrial fibrillation: Secondary | ICD-10-CM | POA: Diagnosis not present

## 2018-05-06 DIAGNOSIS — D649 Anemia, unspecified: Secondary | ICD-10-CM

## 2018-05-06 DIAGNOSIS — J9611 Chronic respiratory failure with hypoxia: Secondary | ICD-10-CM

## 2018-05-06 DIAGNOSIS — I10 Essential (primary) hypertension: Secondary | ICD-10-CM

## 2018-05-06 DIAGNOSIS — J432 Centrilobular emphysema: Secondary | ICD-10-CM

## 2018-05-06 DIAGNOSIS — R531 Weakness: Secondary | ICD-10-CM | POA: Diagnosis not present

## 2018-05-06 DIAGNOSIS — Z8701 Personal history of pneumonia (recurrent): Secondary | ICD-10-CM | POA: Diagnosis not present

## 2018-05-06 DIAGNOSIS — J441 Chronic obstructive pulmonary disease with (acute) exacerbation: Secondary | ICD-10-CM | POA: Diagnosis not present

## 2018-05-06 DIAGNOSIS — J189 Pneumonia, unspecified organism: Secondary | ICD-10-CM | POA: Diagnosis not present

## 2018-05-06 DIAGNOSIS — Z8679 Personal history of other diseases of the circulatory system: Secondary | ICD-10-CM

## 2018-05-06 DIAGNOSIS — G4733 Obstructive sleep apnea (adult) (pediatric): Secondary | ICD-10-CM

## 2018-05-06 DIAGNOSIS — I493 Ventricular premature depolarization: Secondary | ICD-10-CM | POA: Diagnosis not present

## 2018-05-06 LAB — IRON,TIBC AND FERRITIN PANEL
%SAT: 28 % (calc) (ref 20–48)
Ferritin: 44 ng/mL (ref 24–380)
IRON: 100 ug/dL (ref 50–180)
TIBC: 363 mcg/dL (calc) (ref 250–425)

## 2018-05-06 LAB — COMPREHENSIVE METABOLIC PANEL
ALT: 21 U/L (ref 0–53)
AST: 10 U/L (ref 0–37)
Albumin: 3.9 g/dL (ref 3.5–5.2)
Alkaline Phosphatase: 54 U/L (ref 39–117)
BUN: 24 mg/dL — ABNORMAL HIGH (ref 6–23)
CO2: 33 mEq/L — ABNORMAL HIGH (ref 19–32)
Calcium: 9 mg/dL (ref 8.4–10.5)
Chloride: 99 mEq/L (ref 96–112)
Creatinine, Ser: 0.95 mg/dL (ref 0.40–1.50)
GFR: 81.35 mL/min (ref >60.00)
GLUCOSE: 105 mg/dL — AB (ref 70–99)
POTASSIUM: 4.1 meq/L (ref 3.5–5.1)
SODIUM: 138 meq/L (ref 135–145)
Total Bilirubin: 0.5 mg/dL (ref 0.2–1.2)
Total Protein: 6.3 g/dL (ref 6.0–8.3)

## 2018-05-06 LAB — CBC WITH DIFFERENTIAL/PLATELET
Basophils Absolute: 0 10*3/uL (ref 0.0–0.1)
Basophils Relative: 0.1 % (ref 0.0–3.0)
EOS PCT: 0.3 % (ref 0.0–5.0)
Eosinophils Absolute: 0 10*3/uL (ref 0.0–0.7)
HCT: 37.3 % — ABNORMAL LOW (ref 39.0–52.0)
Hemoglobin: 12 g/dL — ABNORMAL LOW (ref 13.0–17.0)
LYMPHS ABS: 0.9 10*3/uL (ref 0.7–4.0)
Lymphocytes Relative: 6.2 % — ABNORMAL LOW (ref 12.0–46.0)
MCHC: 32.3 g/dL (ref 30.0–36.0)
MCV: 89.2 fl (ref 78.0–100.0)
MONOS PCT: 5.7 % (ref 3.0–12.0)
Monocytes Absolute: 0.8 10*3/uL (ref 0.1–1.0)
NEUTROS ABS: 12.4 10*3/uL — AB (ref 1.4–7.7)
NEUTROS PCT: 87.7 % — AB (ref 43.0–77.0)
PLATELETS: 325 10*3/uL (ref 150.0–400.0)
RBC: 4.18 Mil/uL — ABNORMAL LOW (ref 4.22–5.81)
RDW: 17 % — ABNORMAL HIGH (ref 11.5–15.5)
WBC: 14.1 10*3/uL — ABNORMAL HIGH (ref 4.0–10.5)

## 2018-05-06 NOTE — Patient Instructions (Signed)
Today we updated your med list in our EPIC system...    Continue your current medications the same...  Continue the Oxygen, DUONEB, Advair, & Spiriva as we discussed...  Continue the PREDNISONE taper down to 10mg  per day & stay on this...  Continue the Prilosec...  Continue the IRON tab (325mg ) with a 500mg  Vit C daily...  Today we checked a follow up CXR & blood work...    We will contact you w/ the results when available...   OK to restart the Physical Therapy at [facility] outpt PT next week as long as you continue to improve...  Call for any questions...  Let's plan a follow up visit in 46mo, sooner if needed for problems.Marland KitchenMarland Kitchen

## 2018-05-06 NOTE — Progress Notes (Signed)
Subjective:     Patient ID: Andrew [name], male   DOB: 1941/08/10, 77 y.o.   MRN: [id number]  HPI 77 y/o WM, an ex-smoker quit in 1986, w/ severe bullous emphysema & hx of RUL bullous resection in 2007; Hx both ?hypercarbic & +hypoxemic resp failure w/ cor pulmonale & secondary pulm HTN on Revatio x yrs; He has been stable for >10 yrs on the same pulm regimen as he has travelled around the [place] living in [place], [place], and [place]...  ~  October 02, 2015:  Initial pulmonary evaluation by SN>  His PCP is Dr. Lujean Amel, [name]...       Poss is a 77 y/o gentleman from [place]- moved here to [place]moago to live w/ his daughter & son-in-law;  He has a long convoluted history & we have none of his prev objective data to review;  He tells me that he has known about COPD/Emphysema for >10 yrs and in 2007 he had right thoracotomy & "bleb-ectomy";  After this procedure he was placed on Oxygen at 2L/min and BiPAP to use at night, along w/ ADVAIR250Bid & SPIRIVA daily, plus REVATIO20Tid for pulmonary HTN;  He was also treated w/ NEBS for about 1-245yrthen this was discontinued;  He has pretty much been on this same regimen for the past 9 yrs w/o much change, despite or maybe because he has moved around a lot- [name], [name]...  He describes himself as being rock-solid stable on this exact regimen since 2007- he had Cath (?left & right heart) in 2007, told 1 blockage, good LVF ?right heart results, started on O2, BiPAP, and Revatio but he does not know why?  He notes min cough when supine & in early AM attributed to reflux; min if any sput production, no hemoptysis, he denies SOB but states DOE "if I over-exert" eg- walking, lifting/carrying, stairs, etc; he notes that ADLs are ok- no problem (he is stoic);  He denies CP, palpit, f/c/s, edema... He hasn't been  to an ER since 2007 he says & that was also the last time he had any Pred; he thinks he had CXR, PFT, 2DEcho all earlier this yr...   Smoking Hx>  He is an ex-smoker, started in his teens, smoked for 30 yrs up to 1ppd, quit in 1986; This is a 30 pack-yr hx, he does not recall ever being checked for A1AT defic...  Pulmonary Hx>  COPD/ Emphysema w/ right upper lobe "bleb[place]) [place] hypoxemic resp failure on O2 at 2L/min since 2007;  He tells me that he was started on BiPAP about that same time but he doesn't know why- never had sleep study, not on CPAP prev, he does not know about pCO2 levels etc ("I like the fresh cool air");  [name] he does not know the settings, machine never downloaded, etc;  He has also been on Revatio20Tid since 2007, apparently never tried on other meds, dose never adjusted- he knows about "pulmonary hypertension" but he doesn't know any details and it doesn't appear to have been followed up, and meds kept the same from doctor to doctor...   Medical Hx>  HBP, ?nonobstructive CAD, HL, thyroid nodule, GERD, constipation, BPH, insomnia...  Family Hx>  Father died w/ Emphysema &  was a former smoker; no other hx lung dis in the family; Alpha-1 status is unknown...  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   Current Meds>  Oxygen 2L/min pulse-dose concentrator, Advair250Bid, Spiriva daily, Revatio20Tid, CardizemCD240, Crestor10, Nexium20, Proscar5, Restoril30...  EXAM shows Afeb, VSS, O2sat=93% on 2L/min pulse-dose;  Heent- neg, mallampati1;  Chest- decr BS at bases, can't augment BS voluntarily, w/o w/r/r;  Heart- RR w/o m/r/g;  Abd- soft, neg;  Ext- neg w/o c/c/e;  Neuro- intact...  CXR 10/02/15 showed norm heart size,  COPD, bullous emphysema/ hyperinflation, scarring right apex, NAD...   Spirometry 10/02/15 showed FVC=2.70 (69%), FEV1=1.12 (37%), %1sec=41, mid-flows reduced at 18% predicted; this is c/w severe airflow obstruction & GOLD Stage 3 COPD  Ambulatory oxygen saturation test 10/02/15> on O2 at 2L/min pulse-dose concentrator: O2sat=96% on 2L at rest w/ pulse=87; he walked 2 laps w/ his O2, stopped due to dyspnea, lowest O2sat=89% w/ pulse=113/min...  LAB 10/02/15>  Alpha-1-Antitrypsin level => pending (he never went to the lab for this blood test)  2DEchocardiogram 10/09/15 showed norm LVF w/ EF=55-60%, norm wall motion, mild MR, mild RA dil, PAsys est 66mHg... Pt on Revatio 20Tid x 934yr& I rec we wean slowly (Decr to Bid now)...    IMP >>     COPD/ bullous emphysema> s/p RUL "bleb-ectomy" in 2007, severe airflow obstruction w/ GOLD Stage3 COPD> on Advair250Bid & Spiriva daily; apparently he has no use for a rescue inhaler; prev on NEB w/ Albut but not for several yrs.     Chronic hypoxemic respiratory failure on O2 at 2L/min via pulse-dose concentrator...    Pt reports using BiPAP since 2007, never been on CPAP, never had sleep study he says, unknown ABGs or pCO2 data; machine from [name] will try to get the old data => none received.    Hx pulmonary hypertension on Revatio20Tid since 2007 w/o additional med trials or dose adjustments> we do not have any of the objective data from his prev physician teams... Current 2DEcho w/ PAsys est 3626m & we will wean the Revatio to Bid at this point => he does not want to wean further for "other" reasons...    Medical issues include:  HBP, ?nonobstructive CAD, HL, thyroid nodule, GERD, BPH, insomnia... PLAN >>     MarOmids a distinctly patchy history to go along w/ his severe airflow obstruction & bullous emphysema;  We really need his old objective data from 2007 when he was started on O2 & BiPAP after his RUL bleb reduction surg;  He will try to get  names and numbers for us-[place] the meanwhile we will [place] [facility] [place] [place] more recent data as we establish out [place] that he wants [place] continue his current regimen which has served him well over the last 70yr270yrContinue Advair250Bid, Spiriva daily, Revatio20Tid=>Bid, O2 at 2L/min, and the BiPAP nightly as currently set... We plan ROV recheck in 1 month... NOTE> 2DEcho w/ PAsys est ~36mm40m we will slowly wean Revatio...  ~  November 01, 2015:  20mo R64mo Andrew [name] reports stable, doing satis & notes no untoward effects from cutting the Revatio to 20mgBi49me denies CP, palpit, incr SOB, edema, etc; he  continues on the O2 at 2L/min, BiPAP from [place] once daily; we have not received any records from his mult physicians ([place], [place], [place])...    EXAM shows Afeb, VSS, O2sat=92% on 2L/min pulse-dose;  Heent- neg, mallampati1;  Chest- decr BS at bases, can't augment BS voluntarily, w/o w/r/r;  Heart- RR w/o m/r/g;  Abd- soft, neg;  Ext- neg w/o c/c/e;  Neuro- intact... IMP/PLAN>>  See prob list above- he is stable on this regimen but does not want to wean the Revatio further for "other" reasons; OK to continue current med regimen- advised regular exercise vs pulm rehab program; given ZPak for prn use over the winter & he knows to call for any resp issues, incr dyspnea, etc... He remains on BiPAP but we do not have any data- no records received from prev pulm physicians, no notes from [place], no download data from his machine- we will again try to make contact w/ his DME company... We plan ROV in 3-67mo  ~  Mar 18, 2016:  4-542moOV & post hosp visit>  Ma[place] severe COPD/Emphysema, GOLD Stage 3 w/ FEV1=1.12L (37%predicted) [id number] Hx right thoracotomy & "bleb-ectomy" in 2007;  after this procedure he says he was placed on Oxygen at 2L/min and BiPAP to use at night, along w/ ADVAIR250Bid, SPIRIVA  daily, and REVATIO20Tid for pulmonary HTN (2DEcho here 11/16 showed PAsys est=3643m)-- we do not have any of that data from [place] have been unsuccessful in obtaining old data from any of his prev physicians); he has been resistent to any adjustment in his medication regimen for various reasons...     He's been [facility]> [facility] by [facility] w/ CAP- CXR showed severe bullous emphysema, incr markings in RLL and atelectasis in R-mid lung, Temp 103, Lactate=2.4, WBC 16K; treated w/ O2, Solumedrol=>Pred, Zosyn/Vanco=>Levaquin, NEBs, etc; disch home w/ home health help- ?seen by his PCP after disch...    [facility] w/ 1d hx incr SOB, wheezing, productive cough & felt to have a COPD exac; CXR showed his COPE/E, persistent incr markings in right base, WBC was elev at 20K, BNP=60, no pos cultures; he was treaed w/ O2, Solumedrol, Roceph/Zithro, NEBs, etc; he was disch on Levaquin & Pred; he developed urinary retention & foley placed (weaned off in NH); he was debilitated & sent to NH for rehab...     [facility] & attended by PieSunGardte dated 03/01/16 reviewed-- finished Levaquin, weaned off the Pred, they were able to discontinue the foley & he passed voiding trial; disch home after 19d in rehab...     Now back home on same O2= 2L/min days & 3L/min night w/ BiPAP ?settings (he has been on this for yrs and never re-assessed), NEB w/ AlbutTid, Advair250Bid, Spiriva daily, Revatio20Bid (pt refuses to taper this med further);  He has developed pedal edema x3d & needs a low sodium diet + diuretic started but he tells me he is sched to see his PCP soon for this problem...    EXAM shows Afeb, VSS, O2sat=95% on 2L/min pulse-dose;  Heent- neg, mallampati1;  Chest- decr BS at bases, can't augment BS voluntarily, w/o w/r/r;  Heart- RR w/o m/r/g;  Abd- soft, neg;  Ext- neg w/o c/c/e;  Neuro- intact...  CXR 02/11/16 showed  hyperinflation, bullous emphysema, some scarring in right mid lung & both lower lobes, no  infiltrate, no edema...  LABS 01/2016> all reviewed in Epic... IMP/PLAN>>  Kahleb has had a protracted resp exac- triggered by prob RLL pneumonia (NOS) superimposed on his severe COPD/bullous emphysema;  Rec to change the Albut for Neb to [place] & continue treatments Tid; continue other meds regularly as outlined;  He is referred to [facility] & hopes to start soon;  We will plan ROV in 38mosooner if needed.  ~  June 20, 2016:  365moOV w/ SN>  Andrew [name] returns for a 46m47moV & states that he is doing very well- no new complaints or concerns at this time, his PCP is DrKoirala; [facility] & continues in this program at present; Pt prev inquired about treatments at [facility] [place] regenerative [facility] tissue (stem cell therapy) which costs ~$10K per treatment & all benefits are antecdotal; I [facility] if he wants cutting edge research approach...     EPIC records indicate ER visit 05/08/16 for Abd Pain> VSS, exam was neg, CT Abd showed large fecal burden otherw neg, distended urinary bladder, atherosclerosis, bilat L5 pars defects; Rec to take laxatives...     Severe COPD- GOLD Stage3, bullous emphysema, s/p resection of RUL bleb in 2007> on Advair250Bid & Spiriva daily; he has NEB w/ Duoneb for prn use; Spirometry 09/2015 w/ FEV1=1.12 (37%); he is enrolled in PulOhiowants a POC instead of tanks;     Chronic hypoxemic respiratory failure on O2 at 2L/min via POC> ambulatory O2sats 09/2015 dropped to 89% on 2L/min after 2 Laps.    Hx prob hypercarbic resp failure inferred from Pt report of BiPAP ([facility]) since 2007, never been on CPAP, never had sleep study, unknown ABGs or pCO2 data>    Hx cor pulmonale/ pulmonary hypertension on Revatio20Tid since 2007 w/o additional med trials or dose adjustments> we do not have any of the objective data from his prev  physician teams; 2DEcho here 09/2015 showed PAsys=36 and we tried to wean his Revatio but he refused due to "other reasons", finally compromised on Revatio20Bid...    Hx CAP- Hosp 12/2015 w/ RLL opac (nos) & responded to broad spectrum Ab coverage + Sulomedrol, O2, Nebs, etc; readmitted 01/2016 w/ COPD exac- similar Rx but sent to NH for rehab at disch...    Cardiac issues>  ?nonobstructive CAD, cor pulmonale w/ mild pulmHTN & 2DEcho 09/2015 showing norm LVF w/ EF=55-60%, norm wall motion, mild MR, mild RA dil, PAsys est 75m11m..    Medical issues include:  HBP (on CardizemCD240), HL (on Cres10), hx thyroid nodule, GERD (on Nexium40), Constipation, BPH (on Proscar5 & Flomax0.4) w/ hx urinary retention, insomnia (on Restoril30)... EXAM shows Afeb, VSS, O2sat=93% on 2L/min pulse-dose;  Heent- neg, mallampati1;  Chest- decr BS at bases, can't augment BS voluntarily, w/o w/r/r;  Heart- RR w/o m/r/g;  Abd- soft, neg;  Ext- neg w/o c/c/e;  Neuro- intact...  CXR 05/08/16>  Mod bullous emphysema w/ chr changes at the lung bases w/ pleuroparenchymal scarring, surg suture lines ove the right mid lung...  LABS 04/2016 in epic> Chems- ok, BS=149;  CBC- anemia w/ Hg= 10.4-11.7, WBC=15K IMP/PLAN>>  Andrew [name] is stable on his baseline regimen + pulm rehab, etc; rec to continue current meds and exercise; he needs the 2017 Flu vaccine when avail & will call prn any breathing problems; we plan ROV in 5mo.145mo ADDENDUM>>  Pt Hosp by [facility] 8/11 - 07/01/16 w/  CAP after presenting w/ incr SOB, decr O2sats at pulm rehab, & chills 9he lives w/ school aged grandkids)- CXR showed ?RLL opac, cultures neg & NOS- treated empirically w/ Rocephin & Zithromax, Solumedrol, O2, NEBS, etc; he improved gradually & disch home;  He went to see ENT 8/29 because he thought the pneumonia may have been from reflux & dysphagia- fiberoptic exam revealed some edema & pooling of secretions c/w LPR=> placed on PPI Bid and referred to GI;  Barium esophagram  showed a [place] & mod GERD but no esophagitis mass or stricture;  He saw DrSchooler & had EGD 08/23/16 showing a Zebulon, patchy candida=> treated w/ Nystatin, later required Diflucan...   ADDENDUM>>  Pt had colonoscopy 08/23/16 by DrSchooler-- small HH, patchy candida, mild gastitis seen;  Treated w/ Nystatin, continue Prilosec40/d...   ~  November 25, 2016:  78moROV w/ SN>  Andrew [name] returns c/o 3d hx cough, lots of clear whitish sput, increased weakness and SOB "even on my oxygen"; symptoms started w/ a slight sore throat, hoarse, cough as noted but no f/c/s; he has been going to the Gym 5d per week to continue his exercise since graduating from the formal pulm rehab program (finished 07/2016) & is doing very well by his estimate (until this URI); he notes that 3d ago he had his usual 2H work out, went to tWellsite geologist then went to lunch & did all this w/o difficulty...      Severe COPD- GOLD Stage3, bullous emphysema, s/p resection of RUL bleb in 2007> on Advair250Bid & Spiriva daily; he has NEB w/ Duoneb for prn use; Spirometry 09/2015 w/ FEV1=1.12 (37%); he finished his PulmRehab 07/2016 & now exercises 5d/wk at the gym; he finally got his POC that he wanted...    Chronic hypoxemic respiratory failure on O2 at 2L/min via POC> ambulatory O2sats 09/2015 dropped to 89% on 2L/min after 2 Laps; repeat 11/2016 dropped to 85%after 1Lap...    Hx prob hypercarbic resp failure inferred from Pt report of BiPAP ([facility]) since 2007, never been on CPAP, never had sleep study, unknown ABGs or pCO2 data> we never received records from former physician teams or any of his old records...     Hx cor pulmonale/ pulmonary hypertension on Revatio20Tid since 2007 w/o additional med trials or dose adjustments> we do not have any of the objective data from his prev physician teams; 2DEcho here 09/2015 showed PAsys=36 and we tried to wean his Revatio but he refused due to "other reasons", finally compromised on Revatio20Bid...    Hx CAP-  Hosp 12/2015 w/ RLL opac (nos) & responded to broad spectrum Ab coverage + Solumedrol, O2, Nebs, etc; readmitted 01/2016 w/ COPD exac- similar Rx but sent to NH for rehab at disch...    Cardiac issues>  ?nonobstructive CAD, cor pulmonale w/ mild pulmHTN & 2DEcho 09/2015 showing norm LVF w/ EF=55-60%, norm wall motion, mild MR, mild RA dil, PAsys est 372mg...    Medical issues include:  HBP (on CardizemCD240), HL (on Cres10), hx thyroid nodule, GERD & prob LPR (on Nexium40), Constipation, BPH (on Proscar5 & Flomax0.4) w/ hx urinary retention, insomnia (on Restoril30)...       NOTE> pt is Iron deficient (see below) & needs to f/u w/ his PCP & GI for this...  EXAM shows Afeb, VSS, O2sat=92% on 3L/min;  Heent- neg, mallampati1;  Chest- decr BS at bases, can't augment BS voluntarily, w/o w/r/r;  Heart- RR w/o m/r/g;  Abd- soft, neg;  Ext- neg w/o  c/c/e;  Neuro- intact...  CXR 06/28/16>  Hyperinflation, marked emphysematous changes, crowed lung markings at the bases, no consolidation or effusions...  CXR 11/25/16>  COPD, bullous emphysema, and bilat pleuroparenchymal changes c/w scarring, no acute abnormality  Ambulatory O2sat on 3L/min pulse-dose POC>  O2sat=98% on 3L/min pulse-dose at rest w/ HR=90/min;  He ambulated only 1Lap (185') w/ O2sat drop to 85% w/ HR=108/min...  LABS 11/25/16>  Chems- wnl x BS=108;  A1c=6.1;  CBC- Hg=12.3, mcv=90, WBC=7.8K;  Fe=31 (6.6%sat) & Ferritin=8.7;  B12=661 IMP/PLAN>>  There is no such thing as a mild exac for Andrew [name]- even the mildest of URIs can cause a severe COPD exac- we discussed Rx w/ Levaquin500 x7d, Pred80m- 5d taper (see AVS), + Diflucan100 per his request; hopefully he will respond but he has little in the way of reversible factors given his severe emphysema; [facility] symptoms worsen despite therapy; as noted he is Fe defic & needs further eval per his PCP & GI- copies sent...  ~  December 23, 2016:  163moOV & post hospital  check>  [name] by TrDiona Browner/ a COPD exac w/ acute on chronic resp failure;  Pt already on Home O2 at 2L/min and BiPAP Qhs; he had a root canal done, developed a sore throat, then cough, etc; Levaquin & Pred called -in for pt, Hosp for 4d w/ IV solumedrol & ultimately disch on Pred10... Currently c/o DOE w/ ADLs which is new to him as prev he was going to the gym etc...     He is currently taking Duoneb Q6H as needed + Advair250Bid, Spiriva via handihaler daily, Pred 1060mabs- slow taper... EXAM shows Afeb, VSS, O2sat=94% on 2L/min;  Heent- neg, mallampati1;  Chest- decr BS at bases, can't augment BS voluntarily, w/o w/r/r;  Heart- RR w/o m/r/g;  Abd- soft, neg;  Ext- neg w/o c/c/e;  Neuro- intact...  CXR 12/07/16 (independently reviewed by me in the PACS system)> norm heart size, calcif Ao, severe COPD w/ apical bullous changes & surg staples on right, NAD... Marland KitchenMarland KitchenKG 12/07/16 showed NSR, rate 87, poor R prog V1-2, otherw WNL... Marland KitchenMarland KitchenABS 11/2016> NOT retaining CO2 (ABG w/ pCO2=39;  Chems- ok;  CBC- ok w/ Hg=11-12 and wbc=21=>12;  BNP= wnl... IMP/PLAN>>  Andrew [name] had a COPD exac & required IV Solumedrol + in hosp attention w/ NEBS etc & now he is approaching his baseline; he called for an order for a Hosp bed but was referred to his PCP for this determination;  We reviewed the NEED for DUONEB Tid followed by Advair250Bid & Spiriva once daily; we will wean his Pred from 72m79mto 5mg/5m plan ROV receheck in 6wks...  NOTE: >50% of this 25min73m was spent in counseling & coordination of care...  ~  January 28, 2017:  6wk ROV & Yadkinable on meds + Pred 5mg/d;38me called 01/15/17 for antibiotics for an infected tooth- called in Augmentin & tooth has been extracted by dentist;  He remains on NEBs w/ Duoneb Tid, followed by Advair250Bid and Spiriva daily;  He reports using his Revatio "prn";  His breathing continues to improve, back in the gym 3d/wk, approaching his baseline he says;  Denies  cough, sput, hemoptysis, CP, etc... We reviewed his prob list as above...  EXAM shows Afeb, VSS, O2sat=96% on 2L/min;  Heent- neg, mallampati1;  Chest- decr BS at bases, can't augment BS voluntarily, w/o  w/r/r;  Heart- RR w/o m/r/g;  Abd- soft, neg;  Ext- neg w/o c/c/e;  Neuro- intact... IMP/PLAN>>  Andrew [name] continues to improve & move toward his baseline; Rec to continue Pred27m/d thru March then decr to 549mQod til return visit in 41m18mo  ~  May 01, 2017:  448mo7448mo & Andrew [name] reports a good 448mo 57morval w/o acute exac and w/o new complaints or concerns; he is back in the gym 3d/wk & back to baseline he says;  As prev noted Andrew [name] is on BiPAP & has been for yrs- [place] & I presume this was done for COPD & CO2 retention ant NOT because of sleep apnea; he does not know his settings 7 we have been unable to get old records indicating his initial parameters or follow up monitoring; cureent problem is that his machine is old, mask is 53yr o12yrno tubing supplies in a long time;  For his part he says he does NOT rest well, just in short intervals;  He goes to bed at 11PM using his BiPAP; wakes at 3-4AM & can't get back to sleep; maybe eats breakfast at 4AM and drifts back to sleep (w/o BiPAP) until 7AM or so; similarly he might nap for 1H during the day (w/o BiPAP); he currently thinks that the BiPAP makes no difference in his sleep & it would just as well suit him to stop it if able...  WE DISCUSSED REASSESSMENT w/ ABG on RA, and a HOME SLEEP TEST on RA to evaluate for sleep apnea and hypercarbic/ hypoxemic resp failure...     [facility] does his Home O2 (2L/min continuous) and [facility] does his nursing care    Andrew [name] also tells me he had a suspicious mole removed from his left post shoulder ~48mo ag69mox= melanoma, then wider excision by DERM[place] Severe COPD- GOLD Stage3, bullous emphysema, s/p resection of RUL bleb in 2007> on Advair250Bid & Spiriva  daily; he has NEB w/ Duoneb for prn use; he is off PRED now; Spirometry 09/2015 w/ FEV1=1.12 (37%); he finished his PulmRehab 07/2016 & now exercises regularly at the gym...    Chronic hypoxemic respiratory failure on O2 at 2L/min via POC> ambulatory O2sats 09/2015 dropped to 89% on 2L/min after 2 Laps; repeat 11/2016 dropped to 85% after 1Lap...    Hx prob hypercarbic resp failure inferred from Pt report of BiPAP ([facility]) since 2007, never been on CPAP, never had sleep study> we never received records from former physician teams or any of his old records; ABGs in EPIC> [facility] pH=7.44/ pCO2=35/ pO2=69 ?on RA; 11/2016 pH=7.44/ pCO2=39/ pO2=146 on 4Lnc;   => he has agreed to re-assessment here 2018 w/ ABG on RA and Home Sleep Study => pending...    Hx cor pulmonale/ pulmonary hypertension on Revatio20Tid since 2007 w/o additional med trials or dose adjustments> we do not have any of the objective data from his prev physician teams; 2DEcho here 09/2015 showed PAsys=36 and we tried to wean his Revatio but he refused due to "other reasons", finally compromised on Revatio20Bid...    Hx CAP- Hosp 12/2015 w/ RLL opac (nos) & responded to broad spectrum Ab coverage + Solumedrol, O2, Nebs, etc; readmitted 01/2016 w/ COPD exac- similar Rx but sent to NH for rehab at disch...    Cardiac issues> no cardiologist> ?nonobstructive CAD, cor pulmonale w/ mild pulmHTN & 2DEcho 09/2015 showing norm LVF w/ EF=55-60%, norm  wall motion, mild MR, mild RA dil, PAsys est 44mHg...    Medical issues> PCP= Dr. DLauretta GrillKoirala ([facility])>  HBP (on CardizemCD240), HL (on Cres10), hx thyroid nodule, GERD & prob LPR (GI= DrSchooler, on Prilosec40), Constipation, BPH (on Proscar5 & Flomax0.4) w/ hx urinary retention, insomnia (on Restoril30)...       NOTE> pt is Iron deficient & needs to f/u w/ his PCP & GI for this>> LABS 11/25/16>  Chems- wnl x BS=108;  A1c=6.1;  CBC- Hg=12.3, mcv=90, WBC=7.8K;  Fe=31 (6.6%sat) & Ferritin=8.7;   B12=661 EXAM shows Afeb, VSS, O2sat=90% on 2L/min pulse dose;  Heent- neg, mallampati1;  Chest- decr BS at bases, can't augment BS voluntarily, w/o w/r/r;  Heart- RR w/o m/r/g;  Abd- soft, neg;  Ext- neg w/o c/c/e;  Neuro- intact...  ABG on RA> pending (not done)  Home Sleep Test> pending (see below) IMP/PLAN>>  Andrew [name] has agreed to proceed w/ home sleep test to r/o OSA and we will further assess w/ ABG on RA to check for CO2 retention and any indication for BiPAP (doubt he will qualify at this time);  He will continue w/ his O2, Advair250, Spiriva, Duoneb vs ProventilHFA which he uses prn;  He does not appear to need the Revatio either but declines to discontinue this medication for other reasons.  ADDENDUM>>  Home Sleep Test done 05/20/17>  4H study showed mild OSA w/ AHI=6.0/hr;  Study done on RA-- lowest O2sat was 74% w/ an ave of 87%;  Pt given the option for in-lab CPAP titration study vs trial CPAP w/ autoset & a mask fit session w/ VLynnae Sandhoff  He prefers the latter & states he doesn't believe that he has OSA => we will arrange a mask fit session, the order a ResMed S10, air/ auto- set 5/15, w/ heated humidity, cliamate controlled tubing, enroll in airview w/ ROV in 6-8wks after starting for face to face and download... NOTE: he had the mask fit session w/ new FFM & O2 bleed-in at 3L/min; he was not willing to try the CPAP on Auto, says he requires BiPAP & therefore will need an in-lab CPAP/BiPAP titration test...  ~  July 31, 2017:  3676moOV & Andrew [name] had a Home Sleep Test 05/20/17- as above w/ AHI=6/hr & O2desat to 74% w/ ave 87%;  As noted he has been on BiPAP x yrs [place] to obtain any old records relating to his prev Dx of OSA & his need for BiPAP;  It is indeed unfortunate that he did not travel w/ [place] old [place] that he was INTOL to CPAP when tried  in the past;  The prob is that his old machine is wearing out & he is in need of a new machine- he is unable to afford one on his own;  I explained Medicare's rules for CPAP/ BiPAP & how he must follow their edicts about the repeat eval, and eventually the requirement for a download to prove compliance & efficacy-- in essence this means that he MUST have an in lab CPAP/BiPAP titration test, prove INTOL to CPAP to get the BiPAP then determine the BiPAP settings best for him;  This process is followed by a face to face visit in ~76m15mor download purposes...    NOTE: his PCP is DrKoirala ([employer], multimedia his GI is [facility]);  Pt saw ENT- Nordbladh,PA at [facility] on 06/24/17> c/o worsening dysphagia & nasal congestion, long hx GERD on PPI therapy, larynx exam showed polypoid changes of the cords, prom cricopharyngeus, and postglottic edema; prev EGD w/ smallHH, mod GERD; repeat laryngoscopy was essent neg- they ordered a Ba swallow, done 06/30/17 & showed sl retention in the valleculae that cleared w/ repeat swallowing, 67m barium tablet got lodged in the distal esoph & did not pass into the stomach; He again needs attention from his gastroenterologist for dilatation...  EXAM shows Afeb, VSS, O2sat=90% on 2L/min pulse dose;  Heent- neg, mallampati1;  Chest- decr BS at bases, can't augment BS voluntarily, w/o w/r/r;  Heart- RR w/o m/r/g;  Abd- soft, neg;  Ext- neg w/o c/c/e;  Neuro- intact... IMP/PLAN>>  As explained above we will order an in-lab CPAP/BiPAP titration study, and proceed from there...  ADDENDUM>>  Sleep Lab Titration study 10.18.18>  Optimal pressure= 7cmH2O w/ AHI at optimal pressure= 0/hr;  The study lasted 6.5H, no cardiac arrhythmias, no leg movements, he had O2desat lasting longer than 585m despite control in his sleep apnea w/ CPAP and 1L/min O2 was added to his CPAP;  He uses a small [employer] full face mask & heated humidification...  ~  September 22, 2017:  34m31moV & post-  hosp check>  [name] w/ 4d hx incr cough, green sput, temp to 102 & incr SOB; ER eval revealed right basilar pneumonia, WBC=14.2, & O2sat=87% on 2L/min by [place];  He was treated w/ Zithromax & Rocephin, Solumedrol, Nebs, etc;  Cultures and serologies were all non-revealing & he was disch on Keflex, Pred, Nebs (Xopenex & Ipratripium), etc;  He also had PAF which converted spont & he was seen by [facility] and disch on Eliquis, Cardizem, Metoprolol... [facility]  We reviewed the following medical problems during today's office visit>    Severe COPD- GOLD Stage3, bullous emphysema, s/p resection of RUL bleb in 2007> on Advair250Bid & Spiriva daily; he has NEB w/  Xopenex & Atrovent for prn use; he is off PRED now; Spirometry 09/2015 w/ FEV1=1.12 (37%); he finished his PulmRehab 07/2016 & now exercises regularly at the gym => we re-started Pred 74m33mpering sched down to 1/2 Qod by return visit...    Chronic hypoxemic respiratory failure on O2 at 2L/min via POC> ambulatory O2sats 09/2015 dropped to 89% on 2L/min after 2 Laps; repeat 11/2016 dropped to 85% after 1Lap... He uses O2 at 1L/min Qhs and 2L/min w/ activ.    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    Hx cor pulmonale/ pulmonary hypertension on Revatio20Tid since 2007 w/o additional med trials or dose adjustments> we do not have any of the objective data from his prev physician teams; 2DEcho here 09/2015 showed PAsys=36 and we tried to wean his Revatio but he refused due to "other reasons", finally compromised on Revatio20Bid...    Hx CAP- Hosp 2/17 & 10/18 w/  RLL opac (nos) & responded to broad spectrum Ab coverage + Solumedrol, O2, Nebs, etc; readmitted 01/2016 w/ COPD exac- similar Rx but  sent to NH for rehab at [place]...    Cardiac issues> ?nonobstructive CAD, cor pulmonale w/ mild pulmHTN & 2DEcho 09/2015 showing norm LVF w/ EF=55-60%, norm wall motion, mild MR, mild RA dil, PAsys est 73mHg...episode PAP 10/18 & converted spont- disch from hosp on Eliquis, Metoprolol, Cardizem & f/u w/ [facility]-DrRandolph pending...    Medical issues> PCP= Dr. DLauretta GrillKoirala ([facility])>  HBP (on CardizemCD240), HL (on Cres10), hx thyroid nodule, GERD & prob LPR (GI= DrSchooler, on Prilosec40), Constipation, BPH (on Proscar5 & Flomax0.4) w/ hx urinary retention, anemia & insomnia (on Restoril30)... EXAM shows Afeb, VSS, O2sat=90% on 2L/min pulse dose;  Heent- neg, mallampati1;  Chest- decr BS at bases, can't augment BS voluntarily, w/o w/r/r;  Heart- RR w/o m/r/g;  Abd- soft, neg;  Ext- neg w/o c/c/e;  Neuro- intact...  CXR 09/09/17>  Norm heart size, interval patchy opac & effusion at right base, underlying COPD...  2DEcho 09/10/17>  Norm LV size & function w/ EF=55-60%, no regional wall motion abn, valves are OK, PAsys=334mg  Mod barium swallow 09/10/17> mild pharyngeal dysphagia due to known esoph issues, mild asp risk, swallowing strategies outlined to pt...  LABS in Epic 08/2017>  Chems- ok BS~130, Cr=0.81, Alb=2.7, sl elev LFTs noted;  CBC- anemic w/ Hg=10.9;  TSH=0.52...   CXR 09/22/17 (independently reviewed by me in the PACS system) showed norm heart size, underlying COPD/emphysema, near complete clearing of the right basilar infiltrate... IMP/PLAN>>  We decided to continue his low dose Pred rx w/ 60m48mod;  Continue other meds as prev including NEBs w/ Duoneb Tid followed by Advair250Bid & Spiriva once daily, Mucinex 1-2Bid, Tessalon tid as needed... We plan rov recheck in 6 wks...  ~  November 04, 2017:  [place] improved and approaching his  baseline- still w/ SOB/DOE but ADLs improved, cough/ phlegm stable, no CP/ edema; on Duoneb tid, Advair250Bid & spiriva daily; on Mucinex, MMW, Tessalon as needed; Pred weaned to 1/2 Qod;  ?hold-up from Lin[employer] his new ResMed S10 CPAP set up as above...     He saw [facility]-DrRandolph 09/22/17>  HBP, PAF, HL;  On Eliquis, Metoprolol, Cardizem; Thyroid wnl & Echo w/ EF=55-60%, PAsys=33; felt to be stable- no changes made...     We reviewed his pulm issues/ medical problems during today's office visit-- see above... we do not have notes/ data from [facility]... EXAM shows Afeb, VSS, O2sat=96% on 2L/min pulse dose;  Heent- neg, mallampati1;  Chest- decr BS at bases, can't augment BS voluntarily, w/o w/r/r;  Heart- RR w/o m/r/g;  Abd- soft, neg;  Ext- neg w/o c/c/e;  Neuro- intact... IMP/PLAN>>  We discussed slowly weaning the Pred down to 60mg41md & continue this til ROV in 91mo;67mo will watch BP/ pulse state w/ an eye towards weaning the BBlocker if able;  His appetite is improved, wt up 7#, and back in the gym daily!  ~  December 16, 2017:  6wk ROV & add-on appt requested for cough, yellow sput, dyspnea>  Andrew [name] weaned the Pred to 60mg Q66m& then noticed grad increase in symptoms- cough w/ yellow sput, incr SOB w/ min exertion, etc;  He had ret to the gym & was doing satis until this deterioration;  He uses his CPAP Qhs- rests well, wakes feeling rested, denies daytime hypersomnolence but still naps 2-3/7, states driving is ok w/o drowsiness etc...     Severe COPD/ emphysema w/ bullous dis & prev RUL bleb  resected 2007> on Advair115 & Spiriva daily + Xopenex&Atrovent via NEB prn, and Pred5Qod...    Chr hypoxemic resp failure on O2 at 2L/min via POC...    OSA- on CPAP7 & 1L/min O2 bleed-in.Marland KitchenMarland Kitchen Download 12/30-1/28/19 showed good compliance (30/30d, ave 7+H per night, on CPAP7 w/ min intermit leak, & good efficacy w/ AHI=1.4.Marland KitchenMarland Kitchen     Hx cor pulmonale/ pulmonary hypertension on Revatio20Tid since  2007 w/o additional med trials or dose adjustments=> we cut him to bid w/o any untoward effect but he refused to wean further... EXAM shows Afeb, VSS, O2sat=90% on 2L/min pulse dose;  Heent- neg, mallampati1;  Chest- decr BS at bases, can't augment BS voluntarily, w/o w/r/r;  Heart- RR w/o m/r/g;  Abd- soft, neg;  Ext- neg w/o c/c/e;  Neuro- intact... IMP/PLAN>>  We decided to treat his COPD exac w/ DOXY 166m Bid, and an incr Pred 531mtabs to 2tabs Qam for 1wk, then taper to 2-1 Qod, then 2-0 Qod til return;  Asked to continue NEBS Tid followed by Advair-Bid & Spiriva-Qd, + Mucinex600Bid, etc...   ~  February 02, 2018:  6wk ROV & post hospital follow up>  Andrew [name] was HOSP 3/2 - 01/21/18 by [facility] w/ a COPD exacerbation- presented w/ productive cough, wheezing, incr SOB, feeling bad w/ low grade fever; he was on Pred1054md, NEBS w/ Duoneb Tid, Advair250Bid, SpirivaQd, Mucinex, Fluids, etc=> in Hosp eval revealed no acute CXR changes, Hg=11.2=>9.0, WBC=12.5, Chems- ok, cultures all neg (Flu neg, Legionella neg, Pneumococcal neg); he had been exposed to Flu & covered w/ Tamiflu rx; given Rocephin/ Zithromax/ IV Solumedrol=> disch on Pred taper...  We reviewed the following medical problems during today's office visit>    Severe COPD- GOLD Stage3, bullous emphysema, s/p resection of RUL bleb in 2007> on Pred 75m47m Advair250Bid & Spiriva daily; he has NEB w/ Duoneb to use Tid; Spirometry 09/2015 w/ FEV1=1.12 (37%); he finished his PulmRehab 07/2016 & now exercises regularly at the gym =>     Chronic hypoxemic respiratory failure on O2 at 2L/min via POC> ambulatory O2sats dropped on RA & he uses O2 at 1L/min Qhs and 2L/min w/ activ...    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     Hx cor pulmonale/ pulmonary hypertension on Revatio20Tid since 2007 w/o additional med trials or dose adjustments> we do not have any of the objective data from his prev physician teams; 2DEcho here 09/2015 showed PAsys=36 and we tried to wean his Revatio but he refused due to "other reasons", finally compromised on Revatio20Bid...    Hx CAP- Hosp 2/17 & 10/18 w/ RLL opac (nos) & responded to broad spectrum Ab coverage + Solumedrol, O2, Nebs, etc; readmitted 01/2016 w/ COPD exac- similar Rx but sent to NH for rehab at disch...    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    Medical  issues> PCP= Dr. Lauretta Grill Koirala ([facility])>  HBP (on CardizemCD240), HL (on Cres10), hx thyroid nodule, GERD & prob LPR (GI= DrSchooler, on Prilosec40), Constipation, BPH (on Proscar5 & Flomax0.4) w/ hx urinary retention, Anemia w/ iron dific(PCP eval in progress) & insomnia (on Restoril30)... NOTE:  He's been anemic and Fe has been low- pt tells me that his PCP DrKoirala has done post-hosp blood work & is taking care of his anemia work-up! EXAM shows Afeb, VSS, O2sat=95% on 2L/min pulse dose;  Heent- neg, mallampati1;  Chest- decr BS at bases, can't augment BS voluntarily, w/o w/r/r;  Heart- RR w/o m/r/g;  Abd- soft, neg;  Ext- neg w/o c/c/e;  Neuro- intact...  EKG 02/02/18>  PVCs and ventric bigeminy => refer to cards for further eval & rx...   CXR 01/17/18>  Norm heart size, hyperinflation/ bullous emphysema (esp RUL), no acute abn...  LABS 01/2018 in Hosp>  Chems- ok x HCO3=20, BS=178, Cr=0.95,  LFTs ok;  CBC- Hg 11.2=>9.0, WBC=10.2;   IMP/PLAN>>  Andrew [name]'s pulse was recorded at 34 on arrival but this was because of his bigeminy- he is asymptomatic (doesn't note skips, irreg, fluttering, dizzy, etc); on Eliquis, Metoprolol, Cardizem and we will refer back to [facility] for further eval & Rx... REC to continue O2, Pred 12m/d + DUONEB, ADVAIR, SPIRIVA, Mucinex, fluids... We plan recheck in 173mo ~  March 02, 2018:  41m53moV & when last seen [id number] Andrew [name] was in bigeminy yet asymptomatic & we referred him to [facility]- he saw HMeng,PA (for [employer] 02/10/18> known hx HBP, PAF, HL, mild OSA on CPAP & borderline PulmHTN (he has been on Revatio for yrs & refuses to wean this med further); he had PAF 10/18 when hosp for pneumonia, disch on Eliquis, Metoprolol, Cardizem; currently in NSR w/ PVCs and bigeminy- they incr his Cardizem to 180mg62m plan Holter monitor w/ f/u by DrRandolph...     Pulm status is stable today> on O2 at 1L/min rest & 2L/min w/ exercise; on Pred 10mg/12mdvair250Bid & Spiriva daily; he has NEB w/ Duoneb to use Tid; on CPAP7 Qhs & doing satis he says...    He has numerous medical issues and his PCP is DrKoirala- concern for his persistent normochromic anemia & pt says there has been no additional evaluation done; we discussed the need for f/u CBC, Fe, B12, SPE/IEP & he requested for us to [place] these needed tests today... EXAM shows Afeb, VSS, O2sat=95% on 2L/min pulse dose;  Heent- neg, mallampati1;  Chest- decr BS at bases, can't augment BS voluntarily, w/o w/r/r;  Heart- RR w/o m/r/g;  Abd- soft, neg;  Ext- neg w/o c/c/e;  Neuro- intact...  LABS 03/02/18>  Chems- wnl w/ BS=103, Cr=0.92, LFTs wnl;  CBC- anemic w/ Hg=10.6, mcv=88;  Fe=27 (6%sat), Ferritin=8.6;  B12=638, Folate>24, SPE/IEP- neg (no monoclonal prot);  Stool is POS for hidden blood... IMP/PLAN>>  Andrew [name]'s eval confirms the Dx of iron defic anemia and he has heme pos stools=> we will start Rx w/ FeSO4 325mg +45mC500 daily & refer  to his GI- DrSchooler for further eval... Hx PAF, +PVCs being evaluated by [facility]- [place] on Eliquis, Metoprolol, Cardizem..  ~  Apr 14, 2018:  6wk ROV & last visit Andrew [name] had ventric bigeminy & was seen by Cards w/ Cardizem incr to 180mg/d;33m was anemic w/ Hg=10.6 w/ Fe=27 (6%sat) and Ferritin=8.6; stools were pos for hidden blood & we started FeSO4 + VitC and referred him to  GI- DrSchooler;  We do not have notes from PCP- DrKoirala or from GI- DrSchooler but pt tells me that he is sched for colonoscopy soon... Pt states that his breathing is about the same- min cough (he feels related to reflux), no sput, no hemoptysis, chr stable DOE but says it takes longer to recover after activity, no CP, no f/c/s, etc...  We reviewed the following available interval Epic notes>      He saw [facility]- DrRandolph on 03/23/18>  Hx HBP, PAF, OSA on CPAP now, hx pulmHTN; on Eliquis, Metoprolol, CardizemCD, Revatio;  SOB/DOE was stable, no edema, no dizziness or syncope; they decreased his Metoprolol to 12.58m Bid...     He had a formal PT eval 04/06/18>  Notes reviewed from [facility]/ neurorehab, goals outlined, he is hoping to get back into full pulm rehab eventually... We reviewed the following medical problems during today's office visit>    Severe COPD- GOLD Stage3, bullous emphysema, s/p resection of RUL bleb in 2007> on Pred 179md, Advair250Bid & Spiriva daily; he has NEB w/ Duoneb to use Tid; Spirometry 09/2015 w/ FEV1=1.12 (37%); he finished his PulmRehab 07/2016 & then [facility] regularly at the [facility] => [facility] & hasn't bounced back as hoped, referred for PT [facility].    Chronic hypoxemic respiratory failure on O2 at 2L/min via POC> ambulatory O2sats dropped on RA & he uses O2 at 1L/min Qhs and 2L/min w/ activ...    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     Hx cor pulmonale/ pulmonary hypertension on Revatio since 2007 w/o additional med trials or dose adjustments> we do not have any of the objective data from his prev physician teams; 2DEcho here 09/2015 showed PAsys=36 and we tried to wean his Revatio but he refused due to "other reasons", finally compromised on Revatio20Bid...    Hx CAP- Hosp 2/17 & 10/18 w/ RLL opac (nos) & responded to broad spectrum Ab coverage + Solumedrol, O2, Nebs, etc; readmitted 01/2016 w/ COPD exac- similar Rx but sent to NH for rehab at [facility]...    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    Medical issues> PCP= Dr. DibLauretta Grillirala ([facility])>  HBP (on CardizemCD240), HL (on Cres10), hx thyroid nodule, GERD & prob LPR (GI= DrSchooler, on Prilosec40), Constipation, BPH (on Proscar5 & Flomax0.4) w/ hx urinary retention, Anemia w/ iron defic and heme pos stools (referred to GI- DrSchooler) & insomnia (on Restoril30)... EXAM shows Afeb, VSS, O2sat=97% on 4L/min pulse dose;  Heent- neg, mallampati1;  Chest- decr BS at bases, can't augment BS voluntarily, w/o w/r/r;  Heart- RR w/o m/r/g;  Abd- soft, neg;  Ext- neg w/o c/c/e;  Neuro- intact...  CPAP Download 4/23 - 04/08/18 showed  excellent compliance 30/30 days, ~7hrs per night, CPAP7 w/ AHI=0.7, min leak & doing satis...  IMP/PLAN>>  Andrew [name]  is just starting PT/ outpt rehab exercises & hoping to improve suffic to qualify for regular Pulm Rehab again;  He is regular w/ his NEBS Tid, Advair250Bid & Spiriva daily, Pred '10mg'$ /d and Mucinex '600mg'$ Bid;  Requests refill Pred10 & Tessalon- ok;  Continue CPAP Qhs as he is doing- download looks good...  We plan ROV recheck in 74mo..   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 After the procedures he developed cough, discolored sput, fever, and incr SOB; [facility]/ COPD exac, unable to produce sput for cult, BC neg x2, covered w/ Levaqun & Solumedrol=> weaned & improved toward baseline; f/u CXR w/ COPD/E and NAD;  Labs showed WBC=15K, Hg=10.4, Chems- all wnl;  Disch on same med regimen w/ instructions to f/u w/ PCP-DrKoirala & w/ pulmonary... We reviewed the following medical problems during today's office visit>    Severe COPD- GOLD Stage3, bullous emphysema, s/p resection of RUL bleb in 2007> on Pred '10mg'$ /d, Advair250Bid & Spiriva daily; he has NEB w/ Duoneb to use Tid; Spirometry 09/2015 w/ FEV1=1.12 (37%); he finished his PulmRehab 07/2016 & then exercised regularly at the gym => [facility] w/ COPD exac & hasn't bounced back as hoped, referred for PT [facility] to [facility].    Chronic hypoxemic respiratory failure on O2 at 2L/min via POC> ambulatory O2sats dropped on RA & he uses O2 at 1L/min Qhs and 2L/min w/ activ...    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     Hx cor pulmonale/ pulmonary hypertension on Revatio since 2007 w/o additional med trials or dose adjustments> we do not have any of the objective data from his prev physician teams; 2DEcho here 09/2015 showed PAsys=36 and we tried to wean his Revatio but he refused due to "other reasons", finally compromised on Revatio20Bid...    Hx CAP- Hosp 2/17, 10/18, & 6/19 w/ RLL opac (nos) & responded to broad spectrum Ab coverage + Solumedrol, O2, Nebs, etc; he's had several COPD exac- similar Rx but sent to NH for rehab at [facility]...    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    Medical issues> PCP= Dr. DibLauretta Grillirala ([facility])>  HBP (on CardizemCD240), HL (on Cres10), hx thyroid nodule, GERD & prob LPR (GI= DrSchooler, on Prilosec40), Constipation, BPH (on  Proscar5 & Flomax0.4) w/ hx urinary retention, Anemia w/ iron defic and heme pos stools (referred to GI- DrSchooler) & insomnia (on Restoril30)... EXAM shows Afeb, VSS, O2sat=99% on 2L/min pulse dose;  Heent- neg, mallampati1;  Chest- decr BS at bases, can't augment BS voluntarily, w/o w/r/r;  Heart- RR w/o m/r/g;  Abd- soft, neg;  Ext- neg w/o c/c/e;  Neuro- intact...  CXR 05/06/18>  Norm heart size, advanced COPD/ emphysema w/ apical bullae & incr markings bilat, old sutures on right, NAD...  LABS 05/06/18>  Chems- ok w/ K=4.1, BS=105, Cr=0.95, LFTs wnl;  CBC w/ Hg=12.0, mcv=89, WBC=14K;  Fe=100  (28%sat), Ferritin=44... IMP/PLAN>>  Andrew [name] is improved from his recent COPD exac & hosp- keep Pred at '10mg'$ /d, plus DUONEB Tid, Advair250Bid & Spiriva once daily; he will continue other meds the same & restart his PT w/ the goal of returning to [facility] when able...     Past Medical History:  Diagnosis Date  . Remote hx Pulm HTN on BiPAP for yrs from [place]; re-eval here 2018 w/ mild OSA & nocturnal desat => placed on new CPAP apparatus w/ O2 bleed-in...    . BPH (benign prostatic hyperplasia)   . Cancer ([place])    skin  . COPD (chronic obstructive pulmonary disease) ([place])   . Dysphagia   . Emphysema lung ([place])   . GERD (gastroesophageal reflux disease)    " Silent reflux"  . Hearing loss    right ear  . HLD (hyperlipidemia)   . Hypertension   . Oxygen dependent    2-3 liters  . Oxygen dependent   . Pneumonia   Medical Hx>  HBP, ?nonobstructive CAD, HL, thyroid nodule, GERD, constipation, BPH, insomnia... Meds include>  CardizemCD240, Crestor10, Nexium20, Miralax, Proscar10, Restoril30...   Past Surgical History:  Procedure Laterality Date  . CARDIAC CATHETERIZATION    . CATARACT EXTRACTION W/ INTRAOCULAR LENS  IMPLANT, BILATERAL    . COLONOSCOPY WITH PROPOFOL N/A 04/20/2018   Procedure: COLONOSCOPY WITH PROPOFOL;  Surgeon: Wilford Corner, MD;  Location: [facility];  Service: Endoscopy;  Laterality: N/A;  . ESOPHAGOGASTRODUODENOSCOPY (EGD) WITH PROPOFOL N/A 08/23/2016   Procedure: ESOPHAGOGASTRODUODENOSCOPY (EGD) WITH PROPOFOL;  Surgeon: Wilford Corner, MD;  Location: [facility] ENDOSCOPY;  Service: Endoscopy;  Laterality: N/A;  . ESOPHAGOGASTRODUODENOSCOPY (EGD) WITH PROPOFOL N/A 04/20/2018   Procedure: ESOPHAGOGASTRODUODENOSCOPY (EGD) WITH PROPOFOL;  Surgeon: Wilford Corner, MD;  Location: [place];  Service: Endoscopy;  Laterality: N/A;  . HOT HEMOSTASIS N/A 04/20/2018   Procedure: HOT HEMOSTASIS (ARGON PLASMA COAGULATION/BICAP);  Surgeon: Wilford Corner, MD;  Location: [place];  Service: Endoscopy;  Laterality: N/A;  . LUNG SURGERY    . POLYPECTOMY  04/20/2018   Procedure: POLYPECTOMY;  Surgeon: Wilford Corner, MD;  Location: [facility] ENDOSCOPY;  Service: Endoscopy;;  . TONSILLECTOMY    Hx Thoracotomy & RUL Bleb-ectomy 2007 in [place], [place]...   Outpatient Encounter Medications as of 05/06/2018  Medication Sig  . ADVAIR DISKUS 250-50 MCG/DOSE AEPB Inhale 1 puff into the lungs 2 (two) times daily.  Marland Kitchen albuterol (PROVENTIL HFA;VENTOLIN HFA) 108 (90 Base) MCG/ACT inhaler Inhale 2 puffs into the lungs every 6 (six) hours as needed for wheezing or shortness of breath.  Marland Kitchen apixaban (ELIQUIS) 5 MG TABS tablet Take 1 tablet (5 mg total) by mouth 2 (two) times daily.  . benzonatate (TESSALON) 100 MG capsule Take 1 capsule (100 mg total) by mouth 3 (three) times daily as needed for cough.  . diltiazem (CARDIZEM CD) 180 MG 24 hr capsule Take 1  capsule (180 mg total) by mouth daily.  . finasteride (PROSCAR) 5 MG tablet Take 5 mg by mouth at bedtime.   . fluticasone (FLONASE) 50 MCG/ACT nasal spray Place 1 spray into both nostrils at bedtime.   Marland Kitchen guaiFENesin (MUCINEX) 600 MG 12 hr tablet Take 1 tablet (600 mg total) by mouth 2 (two) times daily.  . hydrocortisone cream 1 % Apply 1 application topically daily as needed (Eczema on face and head).  Marland Kitchen ipratropium-albuterol (DUONEB) 0.5-2.5 (3) MG/3ML SOLN Take 3 mLs by nebulization 3 (three) times daily.  Marland Kitchen liver oil-zinc oxide (DESITIN) 40 % ointment Apply 1 application topically daily.  . metoprolol tartrate (LOPRESSOR) 25 MG tablet Take 25 mg by mouth 2 (two) times daily.   . Multiple Vitamins-Minerals (MULTIVITAMIN WITH MINERALS) tablet Take 1 tablet by mouth daily.  Marland Kitchen omeprazole (PRILOSEC) 40 MG capsule Take 40 mg by mouth 2 (two) times daily.   . OXYGEN Inhale 2-3 L into the lungs See admin instructions. 2L daytime, 3L at night   . polyethylene glycol (MIRALAX / GLYCOLAX) packet Take 17 g by mouth daily. (Patient taking  differently: Take 17 g by mouth at bedtime. Mix in 8 oz liquid and drink)  . predniSONE (DELTASONE) 10 MG tablet Take 4 tabs daily for 3 days, then 3 tabs daily for 3 days, then 2 tabs daily for 3 days, then continue 1 tab daily maintenance as you were doing before.  . rosuvastatin (CRESTOR) 10 MG tablet Take 10 mg by mouth daily.  . sildenafil (REVATIO) 20 MG tablet Take 1 tablet (20 mg total) by mouth 2 (two) times daily.  Marland Kitchen SPIRIVA HANDIHALER 18 MCG inhalation capsule INHALE THE CONTENTS OF 1 CAPSULE EVERY DAY  . tamsulosin (FLOMAX) 0.4 MG CAPS capsule Take 1 capsule (0.4 mg total) by mouth daily after breakfast.  . temazepam (RESTORIL) 30 MG capsule TAKE ONE CAPSULE BY MOUTH EVERY NIGHT AT BEDTIME  . vitamin C (ASCORBIC ACID) 500 MG tablet Take 500 mg by mouth 2 (two) times daily.    No facility-administered encounter medications on file as of 05/06/2018.     No Known Allergies   Immunization History  Administered Date(s) Administered  . Influenza, High Dose Seasonal PF 07/22/2016, 08/18/2017  . Influenza-Unspecified 09/19/2015  . PPD Test 02/15/2016  . Pneumococcal Conjugate-13 09/19/2015  . Pneumococcal Polysaccharide-23 12/20/2016    Current Medications, Allergies, Past Medical History, Past Surgical History, Family History, and Social History were reviewed in [employer] record.   Review of Systems             All symptoms NEG except where BOLDED >>  Constitutional:  F/C/S, fatigue, anorexia, unexpected weight change. HEENT:  HA, visual changes, hearing loss, earache, nasal symptoms, sore throat, mouth sores, hoarseness. Resp:  cough, sputum, hemoptysis; SOB, tightness, wheezing. Cardio:  CP, palpit, DOE, orthopnea, edema. GI:  N/V/D/C, blood in stool; reflux, abd pain, distention, gas. GU:  dysuria, freq, urgency, hematuria, flank pain, voiding difficulty. MS:  joint pain, swelling, tenderness, decr ROM; neck pain, back pain, etc. Neuro:  HA,  tremors, seizures, dizziness, syncope, weakness, numbness, gait abn. Skin:  suspicious lesions or skin rash. Heme:  adenopathy, bruising, bleeding. Psyche:  confusion, agitation, sleep disturbance, hallucinations, anxiety, depression suicidal.   Objective:   Physical Exam       Vital Signs:  Reviewed...   General:  WD, WN, 77 y/o WM in NAD; alert & oriented; pleasant & cooperative... HEENT:  [place]/AT; Conjunctiva- pink, Sclera- nonicteric,  EOM-wnl, PERRLA, EACs-clear, TMs-wnl; NOSE-clear; THROAT-clear & wnl.  Neck:  Supple w/ fair ROM; no JVD; normal carotid impulses w/o bruits; no thyromegaly +small nodule palpated; no lymphadenopathy.  Chest:  Overinflated, resonant percussion note, decr BS bilat & can't augment BS voluntarily, no w/r/r heard... Heart:  Regular Rhythm; norm S1 & S2 without murmurs, rubs, or gallops detected. Abdomen:  Soft & nontender- no guarding or rebound; normal bowel sounds; no organomegaly or masses palpated. Ext:  decr ROM; without deformities or arthritic changes; no varicose veins, +venous insuffic, no edema;  Pulses intact w/o bruits. Neuro:  No focal neuro deficits; sensory testing normal; gait normal & balance OK. Derm:  No lesions noted; no rash etc. Lymph:  No cervical, supraclavicular, axillary, or inguinal adenopathy palpated.   Assessment:      IMP >>     Severe COPD- GOLD Stage3, bullous emphysema, s/p resection of RUL bleb in 2007> on Advair250Bid & Spiriva daily; he has NEB w/ Duoneb for prn use; Spirometry 09/2015 w/ FEV1=1.12 (37%); he has completed pulm rehab & now exercises at the gym 5d/wk;  He's had mult COPD exac => treat w/ Levaquin, Pred taper, continue Advair/ spiriva/ NEBS/ O2...    Chronic hypoxemic respiratory failure on O2 at 2L/min via POC> ambulatory O2sats 09/2015 dropped to 89% on 2L/min after 2 Laps; he dropped to 85% after 1Lap on 3L/min POC 11/2016...    Hx prob hypercarbic resp failure inferred from Pt report of BiPAP ([facility])  since 2007, never been on CPAP, never had sleep study, unknown ABGs or pCO2 data> we never received data from his prev physicians.    Hx cor pulmonale/ pulmonary hypertension on Revatio20Tid since 2007 w/o additional med trials or dose adjustments> we do not have any of the objective data from his prev physician teams; 2DEcho here 09/2015 showed PAsys=36 and we tried to wean his Revatio but he refused due to "other reasons", finally compromised on Revatio20Bid...    Hx CAP- Hosp 12/2015 w/ RLL opac (nos) & responded to broad spectrum Ab coverage + Sulomedrol, O2, Nebs, etc; readmitted 01/2016 w/ COPD exac- similar Rx but sent to NH for rehab at disch...    Hx of being on Home BiPAP since 2007 w/ eval & set up in [place]> we have been unable to obtain any old records regarding his initial studies and BiPAP set up...    Cardiac issues>  ?nonobstructive CAD, cor pulmonale w/ mild pulmHTN & 2DEcho 09/2015 showing norm LVF w/ EF=55-60%, norm wall motion, mild MR, mild RA dil, PAsys est 80mHg...    Medical issues include:  HBP (on CardizemCD240), HL (on Cres10), hx thyroid nodule, GERD (on Nexium40), constipation, BPH (on Proscar5 & Flomax0.4) w/ hx urinary retention, insomnia (on Restoril30)...       NOTE>  He is Iron deficient & need further eval...  PLAN >>  10/02/15>   MDamarienhas a distinctly patchy history to go along w/ his severe airflow obstruction & bullous emphysema;  We really need his old objective data from 2007 when he was started on O2 & BiPAP after his RUL bleb reduction surg;  He will try to get names and numbers for [place] in the meanwhile [facility] [place]  He is very concerned that he wants u[place] continue his current regimen which has served him well over the last 958yr  Continue Advair250Bid, Spiriva daily,  Revatio20tid, O2 at 2L/min, and the BiPAP nightly as currently set...  11/01/15>   See prob list above- he is stable on this regimen but does not want to wean the Revatio further for "other" reasons; OK to continue current med regimen- advised regular exercise vs pulm rehab program; given ZPak for prn use over the winter & he knows to call for any resp issues, incr dyspnea, etc... He remains on BiPAP but we do not have any data- no records received from prev pulm physicians, no notes from [place], no download data from his machine- we will again try to make contact w/ his DME company... 03/18/16>   Idan has had a protracted resp [place] x2 this spring- triggered by prob RLL pneumonia (NOS) superimposed on his severe COPD/bullous emphysema;  Rec to change the Albut for Neb to [place] & continue treatments Tid; continue other meds regularly as outlined;  He is referred to [facility] & hopes to start soon;  We will plan ROV in [place] if needed. 06/20/16>   Andrew [name] is stable on his baseline regimen + pulm rehab, etc; rec to continue current meds and exercise; he needs the 2017 Flu vaccine when avail & will call prn any breathing problems; we plan ROV in 638mo.  11/25/16>   There is no such thing as a mild exac for Andrew [name]- even the mildest of URIs can cause a severe COPD exac- we discussed Rx w/ Levaquin500 x7d, Pred'20mg'$ - 5d taper (see AVS), + Diflucan100 per his request; hopefully he will respond but he has little in the way of reversible factors given his severe emphysema; [facility] worsen despite therapy; as noted he is Fe defic & needs further eval per his PCP & GI- copies sent. => subseq Hosp for 4d w/ IV Solumedrol, NEBs/ O2/ etc... 05/01/17>   Andrew [name] has agreed to proceed w/ home sleep test to r/o OSA and we will further assess w/ ABG on RA to check for CO2 retention and any indication for BiPAP (doubt he will qualify at this time);  He will continue w/ his O2, Advair250, Spiriva, Duoneb vs ProventilHFA which he uses prn;  He does not appear to  need the Revatio either but declines to discontinue this medication for other reasons; 07/31/17>   As explained above we will order an in-lab CPAP/BiPAP titration study, and proceed from there. 08/2017>   He was Hosp by [facility] w/ RLL pneumonia (nos) and ac on chr hypoxemic resp failure; disch on NEBS, Advair, Spiriva, Pred + Eliquis, Cardizem, Metoprolol for his PAF episode...  09/22/17>   We decided to continue his low dose Pred rx w/ '5mg'$  Qod;  Continue other meds as prev including NEBs w/ Duoneb Tid followed by Advair250Bid & Spiriva once daily, Mucinex 1-2Bid, Tessalon tid as needed... We plan rov recheck in 6 wks. 11/04/17>   We discussed slowly weaning the Pred down to '5mg'$  Qod & continue this til ROV in 39m40moWe will watch BP/ pulse state w/ an eye towards weaning the BBlocker if able;  His appetite is improved, wt up 7#, and back in the gym daily! 12/16/17>   We decided to treat his COPD exac w/ DOXY '100mg'$  Bid, and an incr Pred '5mg'$  tabs to 2tabs Qam for 1wk, then taper to 2-1 Qod, then 2-0 Qod til return;  Asked to continue NEBS Tid followed by Advair-Bid & Spiriva-Qd, + Mucinex600Bid, etc. 02/02/18>   Andrew [name]'s pulse  was recorded at 34 on arrival but this was because of his bigeminy- he is asymptomatic (doesn't note skips, irreg, fluttering, dizzy, etc); on Eliquis, Metoprolol, Cardizem and we will refer back to [facility] for further eval & Rx... REC to continue O2, Pred '10mg'$ /d + DUONEB, ADVAIR, SPIRIVA, Mucinex, fluids... We plan recheck in 82mo4/15/19>   Andrew [name]'s eval confirms the Dx of iron defic anemia and he has heme pos stools=> we will start Rx w/ FeSO4 '325mg'$  + VitC500 daily & refer to his GI- DrSchooler for further eval... Hx PAF, +PVCs being evaluated by [facility]-Ivar Drape& he remains on Eliquis, Metoprolol, Cardizem 04/14/18>   Andrew [name] is just starting PT/ outpt rehab exercises & hoping to improve suffic to qualify for regular Pulm Rehab again;  He is regular w/ his NEBS Tid, Advair250Bid & Spiriva daily,  Pred '10mg'$ /d and Mucinex '600mg'$ Bid;  Requests refill Pred10 & Tessalon- ok;  Continue CPAP Qhs as he is doing- download looks good...  We plan ROV recheck in 278mo6/19/19>   Andrew [name] is improved from his recent COPD exac & hosp- keep Pred at '10mg'$ /d, plus DUONEB Tid, Advair250Bid & Spiriva once daily; he will continue other meds the same & restart his PT w/ [facility]   Plan:     Patient's Medications  New Prescriptions   No medications on file  Previous Medications   ADVAIR DISKUS 250-50 MCG/DOSE AEPB    Inhale 1 puff into the lungs 2 (two) times daily.   ALBUTEROL (PROVENTIL HFA;VENTOLIN HFA) 108 (90 BASE) MCG/ACT INHALER    Inhale 2 puffs into the lungs every 6 (six) hours as needed for wheezing or shortness of breath.   APIXABAN (ELIQUIS) 5 MG TABS TABLET    Take 1 tablet (5 mg total) by mouth 2 (two) times daily.   BENZONATATE (TESSALON) 100 MG CAPSULE    Take 1 capsule (100 mg total) by mouth 3 (three) times daily as needed for cough.   DILTIAZEM (CARDIZEM CD) 180 MG 24 HR CAPSULE    Take 1 capsule (180 mg total) by mouth daily.   FINASTERIDE (PROSCAR) 5 MG TABLET    Take 5 mg by mouth at bedtime.    FLUTICASONE (FLONASE) 50 MCG/ACT NASAL SPRAY    Place 1 spray into both nostrils at bedtime.    GUAIFENESIN (MUCINEX) 600 MG 12 HR TABLET    Take 1 tablet (600 mg total) by mouth 2 (two) times daily.   HYDROCORTISONE CREAM 1 %    Apply 1 application topically daily as needed (Eczema on face and head).   IPRATROPIUM-ALBUTEROL (DUONEB) 0.5-2.5 (3) MG/3ML SOLN    Take 3 mLs by nebulization 3 (three) times daily.   LIVER OIL-ZINC OXIDE (DESITIN) 40 % OINTMENT    Apply 1 application topically daily.   METOPROLOL TARTRATE (LOPRESSOR) 25 MG TABLET    Take 25 mg by mouth 2 (two) times daily.    MULTIPLE VITAMINS-MINERALS (MULTIVITAMIN WITH MINERALS) TABLET    Take 1 tablet by mouth daily.   OMEPRAZOLE (PRILOSEC) 40 MG CAPSULE    Take 40 mg by mouth 2 (two) times daily.     OXYGEN    Inhale 2-3 L into the lungs See admin instructions. 2L daytime, 3L at night    POLYETHYLENE GLYCOL (MIRALAX / GLYCOLAX) PACKET    Take 17 g by mouth daily.   PREDNISONE (DELTASONE) 10 MG TABLET    Take 4 tabs daily for 3 days, then 3 tabs  daily for 3 days, then 2 tabs daily for 3 days, then continue 1 tab daily maintenance as you were doing before.   ROSUVASTATIN (CRESTOR) 10 MG TABLET    Take 10 mg by mouth daily.   SILDENAFIL (REVATIO) 20 MG TABLET    Take 1 tablet (20 mg total) by mouth 2 (two) times daily.   SPIRIVA HANDIHALER 18 MCG INHALATION CAPSULE    INHALE THE CONTENTS OF 1 CAPSULE EVERY DAY   TAMSULOSIN (FLOMAX) 0.4 MG CAPS CAPSULE    Take 1 capsule (0.4 mg total) by mouth daily after breakfast.   TEMAZEPAM (RESTORIL) 30 MG CAPSULE    TAKE ONE CAPSULE BY MOUTH EVERY NIGHT AT BEDTIME   VITAMIN C (ASCORBIC ACID) 500 MG TABLET    Take 500 mg by mouth 2 (two) times daily.   Modified Medications   No medications on file  Discontinued Medications   No medications on file

## 2018-05-11 ENCOUNTER — Ambulatory Visit (INDEPENDENT_AMBULATORY_CARE_PROVIDER_SITE_OTHER): Payer: Medicare Other | Admitting: Physical Therapy

## 2018-05-11 ENCOUNTER — Encounter: Payer: Self-pay | Admitting: Physical Therapy

## 2018-05-11 VITALS — BP 112/62 | HR 74

## 2018-05-11 DIAGNOSIS — M6281 Muscle weakness (generalized): Secondary | ICD-10-CM | POA: Diagnosis not present

## 2018-05-11 DIAGNOSIS — R2689 Other abnormalities of gait and mobility: Secondary | ICD-10-CM | POA: Diagnosis not present

## 2018-05-11 DIAGNOSIS — R2681 Unsteadiness on feet: Secondary | ICD-10-CM

## 2018-05-11 NOTE — Patient Instructions (Signed)
   Focus on kicking backwards, kicking sideways and walking sideways.  If enough energy, then can try marching and standing on one leg.

## 2018-05-11 NOTE — Therapy (Signed)
[facility] [address] [place], [place], [address] Phone: [phone]   Fax:  [phone]  Physical Therapy Treatment  Patient Details  Name: Andrew [name] MRN: [id number] Date of Birth: 1941/03/28 Referring Provider: Dr. Teressa Lower    Encounter Date: 05/11/2018  PT End of Session - 05/11/18 1311    Visit Number  4    Number of Visits  25    Date for PT Re-Evaluation  07/05/18    Authorization Type  Medicare    Authorization Time Period  5-88-50 - 2-77-; recertification 02/27/86 to 08/09/2018    PT Start Time  1149    PT Stop Time  1232    PT Time Calculation (min)  43 min    Equipment Utilized During Treatment  Oxygen 2L throughout    Activity Tolerance  Patient limited by fatigue    Behavior During Therapy  [facility] for tasks assessed/performed       Past Medical History:  Diagnosis Date  . Anemia   . BiPAP (biphasic positive airway pressure) dependence    Pt denies history of OSA  . BPH (benign prostatic hyperplasia)   . Cancer ([place])    skin  . COPD (chronic obstructive pulmonary disease) ([place])   . Dysphagia   . Emphysema lung ([place])   . GERD (gastroesophageal reflux disease)    " Silent reflux"  . Hearing loss    right ear  . History of hiatal hernia   . HLD (hyperlipidemia)   . Hypertension   . Oxygen dependent    2-3 liters  . Oxygen dependent   . Pneumonia   . Pulmonary hypertension ([place])   . Sleep apnea    wears cpap    Past Surgical History:  Procedure Laterality Date  . CARDIAC CATHETERIZATION    . CATARACT EXTRACTION W/ INTRAOCULAR LENS  IMPLANT, BILATERAL    . COLONOSCOPY WITH PROPOFOL N/A 04/20/2018   Procedure: COLONOSCOPY WITH PROPOFOL;  Surgeon: Wilford Corner, MD;  Location: [place];  Service: Endoscopy;  Laterality: N/A;  . ESOPHAGOGASTRODUODENOSCOPY (EGD) WITH PROPOFOL N/A 08/23/2016   Procedure: ESOPHAGOGASTRODUODENOSCOPY (EGD) WITH PROPOFOL;  Surgeon: Wilford Corner, MD;   Location[facility] ENDOSCOPY;  Service: Endoscopy;  Laterality: N/A;  . ESOPHAGOGASTRODUODENOSCOPY (EGD) WITH PROPOFOL N/A 04/20/2018   Procedure: ESOPHAGOGASTRODUODENOSCOPY (EGD) WITH PROPOFOL;  Surgeon: Wilford Corner, MD;  Location: [place];  Service: Endoscopy;  Laterality: N/A;  . HOT HEMOSTASIS N/A 04/20/2018   Procedure: HOT HEMOSTASIS (ARGON PLASMA COAGULATION/BICAP);  Surgeon: Wilford Corner, MD;  Location: [place];  Service: Endoscopy;  Laterality: N/A;  . LUNG SURGERY    . POLYPECTOMY  04/20/2018   Procedure: POLYPECTOMY;  Surgeon: Wilford Corner, MD;  Location: [place];  Service: Endoscopy;;  . TONSILLECTOMY      Vitals:   05/11/18 1157 05/11/18 1208 05/11/18 1209  BP: (!) 107/49 112/62   Pulse: 68 74   SpO2: 95% (!) 88% 95%    Subjective Assessment - 05/11/18 1301    Subjective  Feels he is almost back to where he was when he was last at PT. (He was hospitalized 6/8-6/13 with COPD exacerbation and healthcare associated pneumonia)    Pertinent History  Chronic hypoxemic respiratory failure, pulmonary HTN, centrilobular emphysema, paroxysmal atrial fibrillation:  COPD    Patient Stated Goals  improve strength and endurance to be able to participate in pulmonary rehab and then to return to [facility] for exercise    Currently in Pain?  No/denies  [facility] PT Treatment/Exercise - 05/11/18 0001      Transfers   Five time sit to stand comments   12.00      Ambulation/Gait   Ambulation/Gait Assistance  5: Supervision    Ambulation/Gait Assistance Details  to monitor exercise tolerance; O2 2L    Ambulation Distance (Feet)  360 Feet 3 min 15 sec    Assistive device  None    Gait Pattern  Step-through pattern    Ambulation Surface  Indoor;Level      Knee/Hip Exercises: Aerobic   Nustep  L4 60-70 steps/min x 7 minutes with SaO2 93%             PT Education - 05/11/18 1308    Education Details  results of LTGs assessed;  plan to reach out to Pulmonary rehab to address what he needs to be able to do to transition to [facility]; reviewed goals    Person(s) Educated  Patient    Methods  Explanation    Comprehension  Verbalized understanding       PT Short Term Goals - 05/11/18 2102      PT SHORT TERM GOAL #1   Title  Patient will tolerate continuous 15 minutes on Nustep using all 4 extremities with steps/minute >60, SaO2>=88% and RPE <=14 (Target all STGs 06/18/2018-date extended due to 2 week gap in appts due to high census)    Time  4    Period  Weeks    Status  New    Target Date  --      PT SHORT TERM GOAL #2   Title  Patient will ambulate 5 consecutive minutes on treadmill with SaO2>=88% and RPE <=14    Time  4    Period  Weeks    Status  New      PT SHORT TERM GOAL #3   Title  Patient will tolerate up/down 4 steps x 2 (total 8 steps) with SaO2>=88% and RPE <=14    Time  4    Period  Weeks    Status  New        PT Long Term Goals - 05/11/18 2056      PT LONG TERM GOAL #1   Title  Pt will amb. 4" nonstop to demo improved activity tolerance.  (Target for LTGs updated to 07/16/18--date extended due to 2 week lapse in appts due to high census)    Baseline  2" 45 secs nonstop; 3 min 15 sec    Time  8    Period  Weeks    Status  Partially Met    Target Date  --      PT LONG TERM GOAL #2   Title  Pt will perform 5 times sit to stand in </= 8 secs to demo increased strength and endurance.    Baseline  10.13 secs; 6/24 12.00 seconds    Time  8    Period  Weeks    Status  On-going      PT LONG TERM GOAL #3   Title  Pt will negotiate 12 step with 1 rail using a step over step sequence with PRE rating </= 4/10 intensity.    Time  8    Period  Weeks    Status  Deferred pt recently hospitalized and deferred assessment on first appt after      PT LONG TERM GOAL #4   Title  Independent in HEP for strengthening and balance exercises.  Baseline  6/24 was independent prior to recent  hospitalization but has been unable to resume since return home    Time  8    Period  Weeks    Status  Partially Met      PT LONG TERM GOAL #5   Title  Pt will be able to participate in pulmonary rehab upon D/C from PT.    Time  8    Period  Weeks    Status  On-going            Plan - 05/11/18 2133    Clinical Impression Statement  Patient returned to PT after nearly 3 week absence due to hospitalization due to COPD exacerbation & healthcare associated pneumonia. LTGs assessed this visit and re-evaluation completed to establish pt's current baseline. Patient has not met any of his original LTGs, however made progress towards 2 of 5 goals. Goals updated (STGs and LTGs). Patient is highly motivated and anticipate he can make progress towards his goals of return to Pulmonary Rehab and then ultimately his own gym.     Rehab Potential  Good    PT Frequency  2x / week    PT Duration  8 weeks    PT Treatment/Interventions  ADLs/Self Care Home Management;Therapeutic exercise;Therapeutic activities;Gait training;Stair training;Balance training;Neuromuscular re-education;Patient/family education;Energy conservation    PT Next Visit Plan  MONITOR O2 saturation rate (?have him increase to 3L)  -- endurance activities (work towards STGs of 10 min on Nustep and 5 minutes on treadmill), functional strengthening - sit to stand, step training, balance activities, ambulation     PT Home Exercise Plan  gave balance exercises on 04-23-18 - kicks, SLS, sidestepping and marching    Consulted and Agree with Plan of Care  Patient       Patient will benefit from skilled therapeutic intervention in order to improve the following deficits and impairments:  Difficulty walking, Decreased balance, Decreased activity tolerance, Cardiopulmonary status limiting activity, Decreased endurance  Visit Diagnosis: Muscle weakness (generalized) - Plan: PT plan of care cert/re-cert  Unsteadiness on feet - Plan: PT plan of  care cert/re-cert  Other abnormalities of gait and mobility - Plan: PT plan of care cert/re-cert     Problem List Patient Active Problem List   Diagnosis Date Noted  . Iron deficiency anemia, unspecified 04/20/2018  . PVCs (premature ventricular contractions) 02/02/2018  . Pressure injury of skin 01/18/2018  . Influenza-like illness 01/17/2018  . History of pneumonia 12/16/2017  . OSA on CPAP 09/22/2017  . Paroxysmal atrial fibrillation ([facility])   . Malnutrition of moderate degree 09/10/2017  . Protein-calorie malnutrition, severe 12/09/2016  . Dysphagia 08/23/2016  . Normocytic anemia 06/28/2016  . Pressure sore on buttocks 06/28/2016  . Pulmonary hypertension ([place]) 02/20/2016  . GERD (gastroesophageal reflux disease) 02/20/2016  . COPD with acute exacerbation ([place]) 02/13/2016  . Acute respiratory failure with hypoxia ([place]) 02/12/2016  . Hypertension 02/11/2016  . Hyperlipidemia 02/11/2016  . CAP (community acquired pneumonia)   . BPH (benign prostatic hyperplasia) 12/29/2015  . COPD with emphysema ([place]) 10/02/2015  . Chronic hypoxemic respiratory failure ([place]) 10/02/2015  . History of pulmonary hypertension 10/02/2015    Rexanne Mano, PT 05/11/2018, 9:47 PM  [facility] [address] [place], [place], [address] Phone: [phone]   Fax:  [phone]  Name: Everrett Lacasse MRN: [id number] Date of Birth: 12/13/1940

## 2018-05-15 ENCOUNTER — Ambulatory Visit: Payer: Medicare Other | Admitting: Physical Therapy

## 2018-05-15 ENCOUNTER — Encounter: Payer: Self-pay | Admitting: Physical Therapy

## 2018-05-15 VITALS — BP 98/53 | HR 70

## 2018-05-15 DIAGNOSIS — M6281 Muscle weakness (generalized): Secondary | ICD-10-CM | POA: Diagnosis not present

## 2018-05-15 DIAGNOSIS — R2681 Unsteadiness on feet: Secondary | ICD-10-CM

## 2018-05-15 DIAGNOSIS — R2689 Other abnormalities of gait and mobility: Secondary | ICD-10-CM | POA: Diagnosis not present

## 2018-05-16 NOTE — Therapy (Signed)
[facility] [address] [place], [place], [address] Phone: [phone]   Fax:  [phone]  Physical Therapy Treatment  Patient Details  Name: Andrew [name] MRN: [id number] Date of Birth: May 28, 1941 Referring Provider: Dr. Teressa Lower    Encounter Date: 05/15/2018  PT End of Session - 05/15/18 1206    Visit Number  5    Number of Visits  25    Date for PT Re-Evaluation  07/05/18    Authorization Type  Medicare    Authorization Time Period  11-18-73 - 1-02-; recertification 5/85/27 to 08/09/2018    PT Start Time  1148    PT Stop Time  1225    PT Time Calculation (min)  37 min    Equipment Utilized During Treatment  Oxygen 2L throughout    Activity Tolerance  Patient tolerated treatment well    Behavior During Therapy  [facility] for tasks assessed/performed       Past Medical History:  Diagnosis Date  . Anemia   . BiPAP (biphasic positive airway pressure) dependence    Pt denies history of OSA  . BPH (benign prostatic hyperplasia)   . Cancer ([place])    skin  . COPD (chronic obstructive pulmonary disease) ([place])   . Dysphagia   . Emphysema lung ([place])   . GERD (gastroesophageal reflux disease)    " Silent reflux"  . Hearing loss    right ear  . History of hiatal hernia   . HLD (hyperlipidemia)   . Hypertension   . Oxygen dependent    2-3 liters  . Oxygen dependent   . Pneumonia   . Pulmonary hypertension ([place])   . Sleep apnea    wears cpap    Past Surgical History:  Procedure Laterality Date  . CARDIAC CATHETERIZATION    . CATARACT EXTRACTION W/ INTRAOCULAR LENS  IMPLANT, BILATERAL    . COLONOSCOPY WITH PROPOFOL N/A 04/20/2018   Procedure: COLONOSCOPY WITH PROPOFOL;  Surgeon: Wilford Corner, MD;  Location: [place];  Service: Endoscopy;  Laterality: N/A;  . ESOPHAGOGASTRODUODENOSCOPY (EGD) WITH PROPOFOL N/A 08/23/2016   Procedure: ESOPHAGOGASTRODUODENOSCOPY (EGD) WITH PROPOFOL;  Surgeon: Wilford Corner, MD;  Location: [facility] ENDOSCOPY;  Service: Endoscopy;  Laterality: N/A;  . ESOPHAGOGASTRODUODENOSCOPY (EGD) WITH PROPOFOL N/A 04/20/2018   Procedure: ESOPHAGOGASTRODUODENOSCOPY (EGD) WITH PROPOFOL;  Surgeon: Wilford Corner, MD;  Location: [place];  Service: Endoscopy;  Laterality: N/A;  . HOT HEMOSTASIS N/A 04/20/2018   Procedure: HOT HEMOSTASIS (ARGON PLASMA COAGULATION/BICAP);  Surgeon: Wilford Corner, MD;  Location: [place];  Service: Endoscopy;  Laterality: N/A;  . LUNG SURGERY    . POLYPECTOMY  04/20/2018   Procedure: POLYPECTOMY;  Surgeon: Wilford Corner, MD;  Location: [place];  Service: Endoscopy;;  . TONSILLECTOMY      Vitals:   05/15/18 1205 05/15/18 1210 05/15/18 1213 05/15/18 1215  BP:      Pulse: 62 63 (!) 57 70  SpO2: 93% 94% 97% 90%   **see vitals flowsheet for RPE measures   Subjective Assessment - 05/15/18 1150    Subjective  No changes. Has had a good week.     Pertinent History  Chronic hypoxemic respiratory failure, pulmonary HTN, centrilobular emphysema, paroxysmal atrial fibrillation:  COPD    Patient Stated Goals  improve strength and endurance to be able to participate in pulmonary rehab and then to return to [facility] for exercise    Currently in Pain?  No/denies  [facility] Adult PT Treatment/Exercise - 05/15/18 1201      Ambulation/Gait   Gait Comments  see treadmill      Knee/Hip Exercises: Aerobic   Tread Mill  2L O2 ; 5 min; .71mles; 1.3 mph; RPE 15; HRmax 73    Nustep  L4 60-80 steps/min x  15 minutes with SaO2 low 93%, HRmax 65      *closely monitored with frequent assessment of HR, SaO2 and RPE. Patient able to identify need to take a break after 5 minutes ambulating on the treadmill (SaO2 had indeed dropped to 90 %)       PT Education - 05/16/18 1213    Education Details  Heard back from Pulmonary Rehab and they requested his name and insurance information they so could check benefits  (there are lifetime caps on Pulmonary Rehab visits by most insurance); discussed updated goals (tolerating 15 minute bouts of exercise with VSS)    Person(s) Educated  Patient    Methods  Explanation    Comprehension  Verbalized understanding       PT Short Term Goals - 05/15/18 1700      PT SHORT TERM GOAL #1   Title  Patient will tolerate continuous 15 minutes on Nustep using all 4 extremities with steps/minute >60, SaO2>=88% and RPE <=14 (Target all STGs 06/18/2018-date extended due to 2 week gap in appts due to high census)    Baseline  6/28 Met--will monitor for consistent tolerance    Time  4    Period  Weeks    Status  On-going      PT SHORT TERM GOAL #2   Title  Patient will ambulate 5 consecutive minutes on treadmill with SaO2>=88% and RPE <=14    Baseline  6/28 5 minutes, SaO2 90% but RPE 15    Time  4    Period  Weeks    Status  On-going      PT SHORT TERM GOAL #3   Title  Patient will tolerate up/down 4 steps x 2 (total 8 steps) with SaO2>=88% and RPE <=14    Time  4    Period  Weeks    Status  New        PT Long Term Goals - 05/11/18 2056      PT LONG TERM GOAL #1   Title  Pt will amb. 4" nonstop to demo improved activity tolerance.  (Target for LTGs updated to 07/16/18--date extended due to 2 week lapse in appts due to high census)    Baseline  2" 45 secs nonstop; 3 min 15 sec    Time  8    Period  Weeks    Status  Partially Met    Target Date  --      PT LONG TERM GOAL #2   Title  Pt will perform 5 times sit to stand in </= 8 secs to demo increased strength and endurance.    Baseline  10.13 secs; 6/24 12.00 seconds    Time  8    Period  Weeks    Status  On-going      PT LONG TERM GOAL #3   Title  Pt will negotiate 12 step with 1 rail using a step over step sequence with PRE rating </= 4/10 intensity.    Time  8    Period  Weeks    Status  Deferred pt recently hospitalized and deferred assessment on first appt after      PT  LONG TERM GOAL #4   Title   Independent in HEP for strengthening and balance exercises.     Baseline  6/24 was independent prior to recent hospitalization but has been unable to resume since return home    Time  8    Period  Weeks    Status  Partially Met      PT LONG TERM GOAL #5   Title  Pt will be able to participate in pulmonary rehab upon D/C from PT.    Time  8    Period  Weeks    Status  On-going            Plan - 05/16/18 1215    Clinical Impression Statement  Discussed updated goals and awaiting information from Pulmonary rehab re: insurance benefits and eligibility for program/entrance requirements. Patient initiated on seated aerobic machine (Nustep stepper) utilizing all 4 extremities and tolerated well on 2L O2 via nasal cannula. After rest period, advanced to treadmill walking (decreased effort compared to overground ambulation) with patient tolerating 5 minutes with SaO2 down to 90% and HR max 75. After seated extended recovery period, pt agreed he had done as much as he could tolerate today. He did demonstrate excellent progress towards goals.     Rehab Potential  Good    PT Frequency  2x / week    PT Duration  8 weeks    PT Treatment/Interventions  ADLs/Self Care Home Management;Therapeutic exercise;Therapeutic activities;Gait training;Stair training;Balance training;Neuromuscular re-education;Patient/family education;Energy conservation    PT Next Visit Plan  MONITOR O2 saturation rate, HR, RPE throughout session (has been tolerating on 2L/min but have him increase to 3L as needed)  -- endurance activities (work towards 15 min on Nustep and 5 minutes on treadmill-with stable vitals and RPE<15), functional strengthening - sit to stand, step training, balance activities, ambulation     PT Home Exercise Plan  gave balance exercises on 04-23-18 - kicks, SLS, sidestepping and marching    Consulted and Agree with Plan of Care  Patient       Patient will benefit from skilled therapeutic intervention in  order to improve the following deficits and impairments:  Difficulty walking, Decreased balance, Decreased activity tolerance, Cardiopulmonary status limiting activity, Decreased endurance  Visit Diagnosis: Muscle weakness (generalized)  Unsteadiness on feet     Problem List Patient Active Problem List   Diagnosis Date Noted  . Iron deficiency anemia, unspecified 04/20/2018  . PVCs (premature ventricular contractions) 02/02/2018  . Pressure injury of skin 01/18/2018  . Influenza-like illness 01/17/2018  . History of pneumonia 12/16/2017  . OSA on CPAP 09/22/2017  . Paroxysmal atrial fibrillation ([facility])   . Malnutrition of moderate degree 09/10/2017  . Protein-calorie malnutrition, severe 12/09/2016  . Dysphagia 08/23/2016  . Normocytic anemia 06/28/2016  . Pressure sore on buttocks 06/28/2016  . Pulmonary hypertension ([place]) 02/20/2016  . GERD (gastroesophageal reflux disease) 02/20/2016  . COPD with acute exacerbation ([place]) 02/13/2016  . Acute respiratory failure with hypoxia ([place]) 02/12/2016  . Hypertension 02/11/2016  . Hyperlipidemia 02/11/2016  . CAP (community acquired pneumonia)   . BPH (benign prostatic hyperplasia) 12/29/2015  . COPD with emphysema ([place]) 10/02/2015  . Chronic hypoxemic respiratory failure ([place]) 10/02/2015  . History of pulmonary hypertension 10/02/2015    Rexanne Mano, PT 05/16/2018, 12:27 PM  [facility] [address] [place], [place], [address] Phone: [phone]   Fax:  [phone]  Name: Donterrius Santucci MRN: [id number] Date of Birth: 09/18/1941

## 2018-05-22 ENCOUNTER — Telehealth: Payer: Self-pay | Admitting: Cardiovascular Disease

## 2018-05-22 NOTE — Telephone Encounter (Signed)
12.5mg  bid.

## 2018-05-22 NOTE — Telephone Encounter (Signed)
New Message:       Pt c/o medication issue:  1. Name of Medication: metoprolol tartrate (LOPRESSOR) 25 MG tablet  2. How are you currently taking this medication (dosage and times per day)?   3. Are you having a reaction (difficulty breathing--STAT)? No  4. What is your medication issue? Pt is calling to see which dosage of this medication is he supposed to be taking

## 2018-05-22 NOTE — Telephone Encounter (Signed)
Returned call to pt he states that he is confused about his dosage of metoprolol. He states about 5 weeks ago he had a colonscopy and an appt and was told to decrease to 1/2-25mg  tab BID. He states that he was not told if he was to go back to his 25mg  BID. He sates that he will continue kthe 1/2-bid. His last OV notes says 1/2-25mg  tab BID, his PCp told him to take 1/2-25mg  tab BID, but the current medication list says 25mg  BID. What is the dosage that he will ned to continue? 25mg  or 12.5mg  BID? Please advise.

## 2018-05-25 NOTE — Telephone Encounter (Signed)
Left message to call back  

## 2018-05-26 MED ORDER — METOPROLOL TARTRATE 25 MG PO TABS: 12.5000 mg | ORAL_TABLET | Freq: Two times a day (BID) | ORAL | 3 refills | Status: DC

## 2018-05-26 NOTE — Telephone Encounter (Signed)
° ° °  Please return call to patient °

## 2018-05-26 NOTE — Telephone Encounter (Signed)
Spoke to patient.  patient aware to continue with metoprolol 12.5 mg twice a day. Change medication list in chart

## 2018-05-29 DIAGNOSIS — N401 Enlarged prostate with lower urinary tract symptoms: Secondary | ICD-10-CM | POA: Diagnosis not present

## 2018-05-29 DIAGNOSIS — R3912 Poor urinary stream: Secondary | ICD-10-CM | POA: Diagnosis not present

## 2018-05-29 DIAGNOSIS — R3121 Asymptomatic microscopic hematuria: Secondary | ICD-10-CM | POA: Diagnosis not present

## 2018-06-01 ENCOUNTER — Ambulatory Visit: Payer: Medicare Other | Attending: Pulmonary Disease | Admitting: Physical Therapy

## 2018-06-01 ENCOUNTER — Encounter: Payer: Self-pay | Admitting: Physical Therapy

## 2018-06-01 VITALS — HR 72

## 2018-06-01 DIAGNOSIS — R2689 Other abnormalities of gait and mobility: Secondary | ICD-10-CM | POA: Diagnosis not present

## 2018-06-01 DIAGNOSIS — M6281 Muscle weakness (generalized): Secondary | ICD-10-CM | POA: Insufficient documentation

## 2018-06-01 DIAGNOSIS — R2681 Unsteadiness on feet: Secondary | ICD-10-CM | POA: Diagnosis not present

## 2018-06-02 NOTE — Therapy (Signed)
[facility] [address] [place], [place], [address] Phone: [phone]   Fax:  [phone]  Physical Therapy Treatment  Patient Details  Name: Andrew [name] MRN: [id number] Date of Birth: [birth year] Referring Provider: Dr. Teressa Lower    Encounter Date: 06/01/2018  PT End of Session - 06/01/18 1514    Visit Number  6    Number of Visits  25    Date for PT Re-Evaluation  07/05/18    Authorization Time Period  0-93-81 - 8-29-; recertification 9/37/16 to 08/09/2018    PT Start Time  1103    PT Stop Time  1147    PT Time Calculation (min)  44 min    Equipment Utilized During Treatment  Gait belt       Past Medical History:  Diagnosis Date  . Anemia   . BiPAP (biphasic positive airway pressure) dependence    Pt denies history of OSA  . BPH (benign prostatic hyperplasia)   . Cancer ([place])    skin  . COPD (chronic obstructive pulmonary disease) ([place])   . Dysphagia   . Emphysema lung ([place])   . GERD (gastroesophageal reflux disease)    " Silent reflux"  . Hearing loss    right ear  . History of hiatal hernia   . HLD (hyperlipidemia)   . Hypertension   . Oxygen dependent    2-3 liters  . Oxygen dependent   . Pneumonia   . Pulmonary hypertension ([place])   . Sleep apnea    wears cpap    Past Surgical History:  Procedure Laterality Date  . CARDIAC CATHETERIZATION    . CATARACT EXTRACTION W/ INTRAOCULAR LENS  IMPLANT, BILATERAL    . COLONOSCOPY WITH PROPOFOL N/A 04/20/2018   Procedure: COLONOSCOPY WITH PROPOFOL;  Surgeon: Wilford Corner, MD;  Location: [facility];  Service: Endoscopy;  Laterality: N/A;  . ESOPHAGOGASTRODUODENOSCOPY (EGD) WITH PROPOFOL N/A 08/23/2016   Procedure: ESOPHAGOGASTRODUODENOSCOPY (EGD) WITH PROPOFOL;  Surgeon: Wilford Corner, MD;  Location: [facility] ENDOSCOPY;  Service: Endoscopy;  Laterality: N/A;  . ESOPHAGOGASTRODUODENOSCOPY (EGD) WITH PROPOFOL N/A 04/20/2018   Procedure:  ESOPHAGOGASTRODUODENOSCOPY (EGD) WITH PROPOFOL;  Surgeon: Wilford Corner, MD;  Location: [facility];  Service: Endoscopy;  Laterality: N/A;  . HOT HEMOSTASIS N/A 04/20/2018   Procedure: HOT HEMOSTASIS (ARGON PLASMA COAGULATION/BICAP);  Surgeon: Wilford Corner, MD;  Location: [facility];  Service: Endoscopy;  Laterality: N/A;  . LUNG SURGERY    . POLYPECTOMY  04/20/2018   Procedure: POLYPECTOMY;  Surgeon: Wilford Corner, MD;  Location: [facility] ENDOSCOPY;  Service: Endoscopy;;  . TONSILLECTOMY      Vitals:   06/01/18 1113 06/01/18 1123  Pulse: 72   SpO2: 93% (!) 88%    Subjective Assessment - 06/01/18 1503    Subjective  Pt states he has not been to PT in a couple of weeks due to no PT availability    Pertinent History  Chronic hypoxemic respiratory failure, pulmonary HTN, centrilobular emphysema, paroxysmal atrial fibrillation:  COPD    Patient Stated Goals  improve strength and endurance to be able to participate in pulmonary rehab and then to return to [facility] for exercise    Currently in Pain?  No/denies                       [facility] Adult PT Treatment/Exercise - 06/02/18 0001      Transfers   Transfers  Sit to Stand    Number of Reps  Other reps (  comment) 5    Transfer Cueing  -- no UE support      Ambulation/Gait   Ambulation/Gait  Yes    Ambulation/Gait Assistance Details  pt on 2L O2:    Assistive device  Other (Comment)    Gait Pattern  Step-through pattern    Ambulation Surface  Other (comment) on treadmill    Number of Stairs  12 3 reps consecutively     Height of Stairs  6      Knee/Hip Exercises: Aerobic   Tread Mill  see above - pt amb. 3", 2" - pt on 2L O2      Step ups 5 reps each leg with UE support 1 UE support     Balance Exercises - 06/01/18 1511      Balance Exercises: Standing   Standing Eyes Closed  Solid surface;2 reps;10 secs;Head turns    Gait with Head Turns  Forward;2 reps 35' x 2 reps with CGA    Sidestepping  1 rep;Other  (comment) 30' with squats incorporated -O2 86% after 20' wi/this activ    Other Standing Exercises  Pt performed marching 30' on track x 1 reps; then added horizontal head turns during 2nd rep on track with CGA to min assist         Used [employer] step - stepping with leg lift with knee flexed, back down to floor - 3 reps each leg only due to dyspnea with activity O2 rate 85% after this activity; pt rested until O2 sat rate increased to > 90%  Pt performed forward stepping onto [employer] step, lifting one leg, then return back to floor with sidestep onto step, with leg abduction-  2 reps each due to dyspnea; pt's O2 sat rate dropped to 82% with this activity    PT Short Term Goals - 05/15/18 1700      PT SHORT TERM GOAL #1   Title  Patient will tolerate continuous 15 minutes on Nustep using all 4 extremities with steps/minute >60, SaO2>=88% and RPE <=14 (Target all STGs 06/18/2018-date extended due to 2 week gap in appts due to high census)    Baseline  6/28 Met--will monitor for consistent tolerance    Time  4    Period  Weeks    Status  On-going      PT SHORT TERM GOAL #2   Title  Patient will ambulate 5 consecutive minutes on treadmill with SaO2>=88% and RPE <=14    Baseline  6/28 5 minutes, SaO2 90% but RPE 15    Time  4    Period  Weeks    Status  On-going      PT SHORT TERM GOAL #3   Title  Patient will tolerate up/down 4 steps x 2 (total 8 steps) with SaO2>=88% and RPE <=14    Time  4    Period  Weeks    Status  New        PT Long Term Goals - 05/11/18 2056      PT LONG TERM GOAL #1   Title  Pt will amb. 4" nonstop to demo improved activity tolerance.  (Target for LTGs updated to 07/16/18--date extended due to 2 week lapse in appts due to high census)    Baseline  2" 45 secs nonstop; 3 min 15 sec    Time  8    Period  Weeks    Status  Partially Met    Target Date  --  PT LONG TERM GOAL #2   Title  Pt will perform 5 times sit to stand in </= 8 secs to demo increased  strength and endurance.    Baseline  10.13 secs; 6/24 12.00 seconds    Time  8    Period  Weeks    Status  On-going      PT LONG TERM GOAL #3   Title  Pt will negotiate 12 step with 1 rail using a step over step sequence with PRE rating </= 4/10 intensity.    Time  8    Period  Weeks    Status  Deferred pt recently hospitalized and deferred assessment on first appt after      PT LONG TERM GOAL #4   Title  Independent in HEP for strengthening and balance exercises.     Baseline  6/24 was independent prior to recent hospitalization but has been unable to resume since return home    Time  8    Period  Weeks    Status  Partially Met      PT LONG TERM GOAL #5   Title  Pt will be able to participate in pulmonary rehab upon D/C from PT.    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 as evidenced by LOB with ambulation with head turns and also due to pt's report of his inability to maintain balance in shower with EC as with washing hair - states he needs to brace arm against wall for orientation;  pt's greatest O2 saturation drop occurrred after stepping/lifitng leg activity with use of [employer] step with O2 sat rate                                                                                                                              Rehab Potential  Good    PT Frequency  2x / week    PT Treatment/Interventions  ADLs/Self Care Home Management;Therapeutic exercise;Therapeutic activities;Gait training;Stair training;Balance training;Neuromuscular re-education;Patient/family education;Energy conservation    PT Next Visit Plan  cont endurance activities, balance and gait; cont to monitor O2 sat rates    PT Home Exercise Plan  gave balance exercises on 04-23-18 - kicks, SLS, sidestepping and marching    Consulted and Agree with Plan of Care  Patient       Patient will benefit from  skilled therapeutic intervention in order to improve the following deficits and impairments:  Difficulty walking, Decreased balance, Decreased activity tolerance, Cardiopulmonary status limiting activity, Decreased endurance  Visit Diagnosis: Muscle weakness (generalized)  Other abnormalities of gait and mobility  Unsteadiness on feet     Problem List Patient Active Problem List   Diagnosis Date Noted  . Iron deficiency anemia, unspecified 04/20/2018  . PVCs (  premature ventricular contractions) 02/02/2018  . Pressure injury of skin 01/18/2018  . Influenza-like illness 01/17/2018  . History of pneumonia 12/16/2017  . OSA on CPAP 09/22/2017  . Paroxysmal atrial fibrillation ([facility])   . Malnutrition of moderate degree 09/10/2017  . Protein-calorie malnutrition, severe 12/09/2016  . Dysphagia 08/23/2016  . Normocytic anemia 06/28/2016  . Pressure sore on buttocks 06/28/2016  . Pulmonary hypertension ([place]) 02/20/2016  . GERD (gastroesophageal reflux disease) 02/20/2016  . COPD with acute exacerbation ([place]) 02/13/2016  . Acute respiratory failure with hypoxia ([place]) 02/12/2016  . Hypertension 02/11/2016  . Hyperlipidemia 02/11/2016  . CAP (community acquired pneumonia)   . BPH (benign prostatic hyperplasia) 12/29/2015  . COPD with emphysema ([place]) 10/02/2015  . Chronic hypoxemic respiratory failure ([place]) 10/02/2015  . History of pulmonary hypertension 10/02/2015    DildayJenness Corner, PT 06/02/2018, 5:08 PM  [facility] [address] [place], [place], [address] Phone: [phone]   Fax:  [phone]  Name: Andrew [name] MRN: [id number] Date of Birth: 1941/07/01

## 2018-06-05 ENCOUNTER — Ambulatory Visit: Payer: Medicare Other | Admitting: Physical Therapy

## 2018-06-08 ENCOUNTER — Ambulatory Visit: Payer: Medicare Other | Admitting: Physical Therapy

## 2018-06-08 ENCOUNTER — Ambulatory Visit: Payer: Medicare Other | Admitting: Pulmonary Disease

## 2018-06-11 ENCOUNTER — Ambulatory Visit: Payer: Medicare Other | Admitting: Physical Therapy

## 2018-06-15 ENCOUNTER — Ambulatory Visit: Payer: Medicare Other | Admitting: Physical Therapy

## 2018-06-15 ENCOUNTER — Encounter: Payer: Self-pay | Admitting: Physical Therapy

## 2018-06-15 DIAGNOSIS — R2681 Unsteadiness on feet: Secondary | ICD-10-CM | POA: Diagnosis not present

## 2018-06-15 DIAGNOSIS — M6281 Muscle weakness (generalized): Secondary | ICD-10-CM | POA: Diagnosis not present

## 2018-06-15 DIAGNOSIS — R2689 Other abnormalities of gait and mobility: Secondary | ICD-10-CM | POA: Diagnosis not present

## 2018-06-16 ENCOUNTER — Telehealth: Payer: Self-pay | Admitting: Cardiovascular Disease

## 2018-06-16 NOTE — Therapy (Signed)
[facility] [address] [place], [place], [address] Phone: [phone]   Fax:  [phone]  Physical Therapy Treatment  Patient Details  Name: Andrew [name] MRN: [id number] Date of Birth: 1941-01-27 Referring Provider: Dr. Teressa Lower    Encounter Date: 06/15/2018  PT End of Session - 06/15/18 1151    Visit Number  7    Number of Visits  25    Date for PT Re-Evaluation  07/05/18    Authorization Time Period  02-13-50 - 8-84-; recertification 1/66/06 to 08/09/2018    PT Start Time  3016    PT Stop Time  1230    PT Time Calculation (min)  41 min    Equipment Utilized During Treatment  Gait belt;Oxygen    Activity Tolerance  Patient tolerated treatment well;Patient limited by fatigue    Behavior During Therapy  [facility] for tasks assessed/performed       Past Medical History:  Diagnosis Date  . Anemia   . BiPAP (biphasic positive airway pressure) dependence    Pt denies history of OSA  . BPH (benign prostatic hyperplasia)   . Cancer ([place])    skin  . COPD (chronic obstructive pulmonary disease) ([place])   . Dysphagia   . Emphysema lung ([place])   . GERD (gastroesophageal reflux disease)    " Silent reflux"  . Hearing loss    right ear  . History of hiatal hernia   . HLD (hyperlipidemia)   . Hypertension   . Oxygen dependent    2-3 liters  . Oxygen dependent   . Pneumonia   . Pulmonary hypertension ([place])   . Sleep apnea    wears cpap    Past Surgical History:  Procedure Laterality Date  . CARDIAC CATHETERIZATION    . CATARACT EXTRACTION W/ INTRAOCULAR LENS  IMPLANT, BILATERAL    . COLONOSCOPY WITH PROPOFOL N/A 04/20/2018   Procedure: COLONOSCOPY WITH PROPOFOL;  Surgeon: Wilford Corner, MD;  Location: [facility];  Service: Endoscopy;  Laterality: N/A;  . ESOPHAGOGASTRODUODENOSCOPY (EGD) WITH PROPOFOL N/A 08/23/2016   Procedure: ESOPHAGOGASTRODUODENOSCOPY (EGD) WITH PROPOFOL;  Surgeon: Wilford Corner, MD;   Location: [facility] ENDOSCOPY;  Service: Endoscopy;  Laterality: N/A;  . ESOPHAGOGASTRODUODENOSCOPY (EGD) WITH PROPOFOL N/A 04/20/2018   Procedure: ESOPHAGOGASTRODUODENOSCOPY (EGD) WITH PROPOFOL;  Surgeon: Wilford Corner, MD;  Location: [facility];  Service: Endoscopy;  Laterality: N/A;  . HOT HEMOSTASIS N/A 04/20/2018   Procedure: HOT HEMOSTASIS (ARGON PLASMA COAGULATION/BICAP);  Surgeon: Wilford Corner, MD;  Location: [facility];  Service: Endoscopy;  Laterality: N/A;  . LUNG SURGERY    . POLYPECTOMY  04/20/2018   Procedure: POLYPECTOMY;  Surgeon: Wilford Corner, MD;  Location: [facility] ENDOSCOPY;  Service: Endoscopy;;  . TONSILLECTOMY      There were no vitals filed for this visit.  Subjective Assessment - 06/15/18 1150    Subjective  No new complaints. Has been out of town and just got back yesterday. No falls or pain to report.     Pertinent History  Chronic hypoxemic respiratory failure, pulmonary HTN, centrilobular emphysema, paroxysmal atrial fibrillation:  COPD    Patient Stated Goals  improve strength and endurance to be able to participate in pulmonary rehab and then to return to [facility] for exercise    Currently in Pain?  No/denies           [facility] Adult PT Treatment/Exercise - 06/15/18 1212      Ambulation/Gait   Ambulation/Gait  Yes    Ambulation/Gait Assistance  6:  Modified independent (Device/Increase time)    Ambulation/Gait Assistance Details  pt able to carry his portable oxygen tank with no imbalnce noted.     Ambulation Distance (Feet)  -- around gym for various activities    Assistive device  None    Gait Pattern  Step-through pattern    Ambulation Surface  Level;Indoor    Stairs  Yes    Stairs Assistance  5: Supervision    Stairs Assistance Details (indicate cue type and reason)  SaO2 91% after. no balance issues noted. d    Stair Management Technique  Two rails;Alternating pattern;Forwards    Number of Stairs  4 x2 reps    Height of Stairs  6      Knee/Hip  Exercises: Aerobic   Tread Mill  1.3 mph for 5 minutes with 1-2 UE support, supervision. SaO2 91% after on 2 lpm oxygen    Nustep  Level 4.0 with UE/LE's with goal 60-70 steps per minute for 15 minutes on 2 lpm oxygen. SaO2 97% afterwards with RPE rated at 11.           PT Short Term Goals - 06/15/18 1151      PT SHORT TERM GOAL #1   Title  Patient will tolerate continuous 15 minutes on Nustep using all 4 extremities with steps/minute >60, SaO2>=88% and RPE <=14 (Target all STGs 06/18/2018-date extended due to 2 week gap in appts due to high census)    Baseline  06/15/18: met today in session with SaO2 >/=91% and RPE11 after 15 minutes with >/= 60 steps per minute    Status  Achieved      PT SHORT TERM GOAL #2   Title  Patient will ambulate 5 consecutive minutes on treadmill with SaO2>=88% and RPE <=14    Baseline  06/15/18: SaO2 91%, RPE= 11,  1.3 mph x 5 mintutes with 1-2 hand hold    Status  Achieved      PT SHORT TERM GOAL #3   Title  Patient will tolerate up/down 4 steps x 2 (total 8 steps) with SaO2>=88% and RPE <=14    Baseline  06/15/18: met today with SaO2 91%, RPE <10    Status  Achieved        PT Long Term Goals - 05/11/18 2056      PT LONG TERM GOAL #1   Title  Pt will amb. 4" nonstop to demo improved activity tolerance.  (Target for LTGs updated to 07/16/18--date extended due to 2 week lapse in appts due to high census)    Baseline  2" 45 secs nonstop; 3 min 15 sec    Time  8    Period  Weeks    Status  Partially Met    Target Date  --      PT LONG TERM GOAL #2   Title  Pt will perform 5 times sit to stand in </= 8 secs to demo increased strength and endurance.    Baseline  10.13 secs; 6/24 12.00 seconds    Time  8    Period  Weeks    Status  On-going      PT LONG TERM GOAL #3   Title  Pt will negotiate 12 step with 1 rail using a step over step sequence with PRE rating </= 4/10 intensity.    Time  8    Period  Weeks    Status  Deferred pt recently  hospitalized and deferred assessment on first appt after  PT LONG TERM GOAL #4   Title  Independent in HEP for strengthening and balance exercises.     Baseline  6/24 was independent prior to recent hospitalization but has been unable to resume since return home    Time  8    Period  Weeks    Status  Partially Met      PT LONG TERM GOAL #5   Title  Pt will be able to participate in pulmonary rehab upon D/C from PT.    Time  8    Period  Weeks    Status  On-going            Plan - 06/15/18 1151    Clinical Impression Statement  Today's skilled session focused on progress toward STGs with all goals met today. Primary PT heard back from pulmonary rehab and they are to reach out to patient to get him scheduled for orientation with them. Pt is making steady progress toward goals and should benefit from continued PT for smooth transition to pulmonary rehab.     Rehab Potential  Good    PT Frequency  2x / week    PT Treatment/Interventions  ADLs/Self Care Home Management;Therapeutic exercise;Therapeutic activities;Gait training;Stair training;Balance training;Neuromuscular re-education;Patient/family education;Energy conservation    PT Next Visit Plan  cont endurance activities, balance and gait; cont to monitor O2 sat rates    PT Home Exercise Plan  gave balance exercises on 04-23-18 - kicks, SLS, sidestepping and marching    Consulted and Agree with Plan of Care  Patient       Patient will benefit from skilled therapeutic intervention in order to improve the following deficits and impairments:  Difficulty walking, Decreased balance, Decreased activity tolerance, Cardiopulmonary status limiting activity, Decreased endurance  Visit Diagnosis: Muscle weakness (generalized)  Other abnormalities of gait and mobility  Unsteadiness on feet     Problem List Patient Active Problem List   Diagnosis Date Noted  . Iron deficiency anemia, unspecified 04/20/2018  . PVCs (premature  ventricular contractions) 02/02/2018  . Pressure injury of skin 01/18/2018  . Influenza-like illness 01/17/2018  . History of pneumonia 12/16/2017  . OSA on CPAP 09/22/2017  . Paroxysmal atrial fibrillation ([facility])   . Malnutrition of moderate degree 09/10/2017  . Protein-calorie malnutrition, severe 12/09/2016  . Dysphagia 08/23/2016  . Normocytic anemia 06/28/2016  . Pressure sore on buttocks 06/28/2016  . Pulmonary hypertension ([place]) 02/20/2016  . GERD (gastroesophageal reflux disease) 02/20/2016  . COPD with acute exacerbation ([place]) 02/13/2016  . Acute respiratory failure with hypoxia ([place]) 02/12/2016  . Hypertension 02/11/2016  . Hyperlipidemia 02/11/2016  . CAP (community acquired pneumonia)   . BPH (benign prostatic hyperplasia) 12/29/2015  . COPD with emphysema ([place]) 10/02/2015  . Chronic hypoxemic respiratory failure ([place]) 10/02/2015  . History of pulmonary hypertension 10/02/2015    Willow Ora, PTA, [facility] [address] [place], [place] [address] [phone] 06/16/18, 6:36 PM   Name: Gail Creekmore MRN: [id number] Date of Birth: 04-12-1941

## 2018-06-16 NOTE — Telephone Encounter (Signed)
New Message:      Pt is calling to discuss his patient assistance for some medication.

## 2018-06-18 ENCOUNTER — Ambulatory Visit: Payer: Medicare Other | Admitting: Pulmonary Disease

## 2018-06-18 ENCOUNTER — Ambulatory Visit: Payer: Medicare Other | Attending: Pulmonary Disease | Admitting: Physical Therapy

## 2018-06-18 ENCOUNTER — Encounter: Payer: Self-pay | Admitting: Physical Therapy

## 2018-06-18 DIAGNOSIS — R2681 Unsteadiness on feet: Secondary | ICD-10-CM | POA: Insufficient documentation

## 2018-06-18 DIAGNOSIS — R2689 Other abnormalities of gait and mobility: Secondary | ICD-10-CM | POA: Insufficient documentation

## 2018-06-18 DIAGNOSIS — M6281 Muscle weakness (generalized): Secondary | ICD-10-CM | POA: Diagnosis not present

## 2018-06-18 NOTE — Telephone Encounter (Signed)
Papers for Patient assistance brought by, will print RX and have Dr Oval Linsey sign. Once completed will faxed to [facility]

## 2018-06-18 NOTE — Therapy (Signed)
[facility] [address] [place], [place], [address] Phone: [phone]   Fax:  [phone]  Physical Therapy Treatment  Patient Details  Name: Andrew [name] MRN: [id number] Date of Birth: 10/12/1941 Referring Provider: Dr. Scott Nadel    Encounter Date: 06/18/2018  PT End of Session - 06/18/18 1135    Visit Number  8    Number of Visits  25    Date for PT Re-Evaluation  07/05/18    Authorization Time Period  04-06-18 - 8-18-; recertification 05/11/18 to 08/09/2018    PT Start Time  1101    PT Stop Time  1145    PT Time Calculation (min)  44 min    Equipment Utilized During Treatment  Oxygen    Activity Tolerance  Patient tolerated treatment well;Treatment limited secondary to medical complications (Comment) stopped treadmill due to SaO2 down to 87% on 3L    Behavior During Therapy  WFL for tasks assessed/performed       Past Medical History:  Diagnosis Date  . Anemia   . BiPAP (biphasic positive airway pressure) dependence    Pt denies history of OSA  . BPH (benign prostatic hyperplasia)   . Cancer (HCC)    skin  . COPD (chronic obstructive pulmonary disease) (HCC)   . Dysphagia   . Emphysema lung (HCC)   . GERD (gastroesophageal reflux disease)    " Silent reflux"  . Hearing loss    right ear  . History of hiatal hernia   . HLD (hyperlipidemia)   . Hypertension   . Oxygen dependent    2-3 liters  . Oxygen dependent   . Pneumonia   . Pulmonary hypertension (HCC)   . Sleep apnea    wears cpap    Past Surgical History:  Procedure Laterality Date  . CARDIAC CATHETERIZATION    . CATARACT EXTRACTION W/ INTRAOCULAR LENS  IMPLANT, BILATERAL    . COLONOSCOPY WITH PROPOFOL N/A 04/20/2018   Procedure: COLONOSCOPY WITH PROPOFOL;  Surgeon: Schooler, Vincent, MD;  Location: [facility];  Service: Endoscopy;  Laterality: N/A;  . ESOPHAGOGASTRODUODENOSCOPY (EGD) WITH PROPOFOL N/A 08/23/2016   Procedure:  ESOPHAGOGASTRODUODENOSCOPY (EGD) WITH PROPOFOL;  Surgeon: Vincent Schooler, MD;  Location: [facility];  Service: Endoscopy;  Laterality: N/A;  . ESOPHAGOGASTRODUODENOSCOPY (EGD) WITH PROPOFOL N/A 04/20/2018   Procedure: ESOPHAGOGASTRODUODENOSCOPY (EGD) WITH PROPOFOL;  Surgeon: Schooler, Vincent, MD;  Location: [facility];  Service: Endoscopy;  Laterality: N/A;  . HOT HEMOSTASIS N/A 04/20/2018   Procedure: HOT HEMOSTASIS (ARGON PLASMA COAGULATION/BICAP);  Surgeon: Schooler, Vincent, MD;  Location: [facility];  Service: Endoscopy;  Laterality: N/A;  . LUNG SURGERY    . POLYPECTOMY  04/20/2018   Procedure: POLYPECTOMY;  Surgeon: Schooler, Vincent, MD;  Location: [facility];  Service: Endoscopy;;  . TONSILLECTOMY      There were no vitals filed for this visit.  Subjective Assessment - 06/18/18 1059    Subjective  Still hasn't heard from Pulmonary Rehab re: orientation. [facility] my membership for 3 months.     Pertinent History  Chronic hypoxemic respiratory failure, pulmonary HTN, centrilobular emphysema, paroxysmal atrial fibrillation:  COPD    Patient Stated Goals  improve strength and endurance to be able to participate in pulmonary rehab and then to return to [employer] for exercise                       [facility] Adult   PT Treatment/Exercise - 06/18/18 0001      Knee/Hip Exercises: Aerobic   Tread Mill  1.9mh on 3L (SaO2 on2L 90-92% prior to starting treadmill, therefore bumped up to 3L); after 2 min 90% 3L; total time 9 minutes (decr to 1.1 mph after 7 minutes with SaO2 88-89%)    Nustep  Level 4.0 with UE/LE's with goal 60-70 steps per minute for 11 minutes on 3 lpm oxygen. SaO2 91-92% after 2 minutes; by 11 minutes consistently 88-89%; pt slowed pace to 65 steps/minute and stopped at 13 minutes with SaO2 up to 90% in <10 seconds and 92% in 30 seconds      Verbally reviewed his HEP and he reports he is not doing exercises as  much as he should. States SLS remains very challenging. Educated to begin with enough UE support to get his balance and then lighten his UE support and try to maintain position.        PT Education - 06/18/18 1134    Education Details  provided pt with hospital operator phone# (could not find a #specific to Pulmonary Rehab online) and he will call to follow-up; need to consistently get in walking for exercise on his days he is not at therapy    Person(s) Educated  Patient    Methods  Explanation    Comprehension  Verbalized understanding       PT Short Term Goals - 06/15/18 1151      PT SHORT TERM GOAL #1   Title  Patient will tolerate continuous 15 minutes on Nustep using all 4 extremities with steps/minute >60, SaO2>=88% and RPE <=14 (Target all STGs 06/18/2018-date extended due to 2 week gap in appts due to high census)    Baseline  06/15/18: met today in session with SaO2 >/=91% and RPE11 after 15 minutes with >/= 60 steps per minute    Status  Achieved      PT SHORT TERM GOAL #2   Title  Patient will ambulate 5 consecutive minutes on treadmill with SaO2>=88% and RPE <=14    Baseline  06/15/18: SaO2 91%, RPE= 11,  1.3 mph x 5 mintutes with 1-2 hand hold    Status  Achieved      PT SHORT TERM GOAL #3   Title  Patient will tolerate up/down 4 steps x 2 (total 8 steps) with SaO2>=88% and RPE <=14    Baseline  06/15/18: met today with SaO2 91%, RPE <10    Status  Achieved        PT Long Term Goals - 05/11/18 2056      PT LONG TERM GOAL #1   Title  Pt will amb. 4" nonstop to demo improved activity tolerance.  (Target for LTGs updated to 07/16/18--date extended due to 2 week lapse in appts due to high census)    Baseline  2" 45 secs nonstop; 3 min 15 sec    Time  8    Period  Weeks    Status  Partially Met    Target Date  --      PT LONG TERM GOAL #2   Title  Pt will perform 5 times sit to stand in </= 8 secs to demo increased strength and endurance.    Baseline  10.13 secs;  6/24 12.00 seconds    Time  8    Period  Weeks    Status  On-going      PT LONG TERM GOAL #3   Title  Pt will  negotiate 12 step with 1 rail using a step over step sequence with PRE rating </= 4/10 intensity.    Time  8    Period  Weeks    Status  Deferred pt recently hospitalized and deferred assessment on first appt after      PT LONG TERM GOAL #4   Title  Independent in HEP for strengthening and balance exercises.     Baseline  6/24 was independent prior to recent hospitalization but has been unable to resume since return home    Time  8    Period  Weeks    Status  Partially Met      PT LONG TERM GOAL #5   Title  Pt will be able to participate in pulmonary rehab upon D/C from PT.    Time  8    Period  Weeks    Status  On-going            Plan - 06/18/18 1137    Clinical Impression Statement  Patient has been out of town due to daughter's home having mold removed and has not heard from Pulmonary Rehab to initiate program. Provided pt with phone number to call. Patient began with treadmill today as that has been harder for him to tolerate. Seated rest prior to moving to Nustep which he only tolerated for 13 minutes today (compared to 15 minutes previous 2 visits). Today is a very humid, hot day and ?if this caused him to tolerate activity slightly worse compared to last 2 sessions. Patient reports he feels PT is helping him regain his strength and endurance. Continue to work towards goals.     Rehab Potential  Good    PT Frequency  2x / week    PT Treatment/Interventions  ADLs/Self Care Home Management;Therapeutic exercise;Therapeutic activities;Gait training;Stair training;Balance training;Neuromuscular re-education;Patient/family education;Energy conservation    PT Next Visit Plan  work on up/down steps for incr endurance; walking on "track" instead of treadmill (goal is 4 minutes consecutively); balance and gait; cont to monitor O2 sat rates    PT Home Exercise Plan  gave  balance exercises on 04-23-18 - kicks, SLS, sidestepping and marching    Consulted and Agree with Plan of Care  Patient       Patient will benefit from skilled therapeutic intervention in order to improve the following deficits and impairments:  Difficulty walking, Decreased balance, Decreased activity tolerance, Cardiopulmonary status limiting activity, Decreased endurance  Visit Diagnosis: Muscle weakness (generalized)  Other abnormalities of gait and mobility     Problem List Patient Active Problem List   Diagnosis Date Noted  . Iron deficiency anemia, unspecified 04/20/2018  . PVCs (premature ventricular contractions) 02/02/2018  . Pressure injury of skin 01/18/2018  . Influenza-like illness 01/17/2018  . History of pneumonia 12/16/2017  . OSA on CPAP 09/22/2017  . Paroxysmal atrial fibrillation (HCC)   . Malnutrition of moderate degree 09/10/2017  . Protein-calorie malnutrition, severe 12/09/2016  . Dysphagia 08/23/2016  . Normocytic anemia 06/28/2016  . Pressure sore on buttocks 06/28/2016  . Pulmonary hypertension (HCC) 02/20/2016  . GERD (gastroesophageal reflux disease) 02/20/2016  . COPD with acute exacerbation (HCC) 02/13/2016  . Acute respiratory failure with hypoxia (HCC) 02/12/2016  . Hypertension 02/11/2016  . Hyperlipidemia 02/11/2016  . CAP (community acquired pneumonia)   . BPH (benign prostatic hyperplasia) 12/29/2015  . COPD with emphysema (HCC) 10/02/2015  . Chronic hypoxemic respiratory failure (HCC) 10/02/2015  . History of pulmonary hypertension 10/02/2015    Lynn   P Sasser, PT 06/18/2018, 12:05 PM  [facility] [address] [place], [place], [address] Phone: [phone]   Fax:  [phone]  Name: Bert Inzunza MRN: [id number] Date of Birth: 12/15/1940   

## 2018-06-19 ENCOUNTER — Telehealth: Payer: Self-pay | Admitting: Pulmonary Disease

## 2018-06-19 NOTE — Telephone Encounter (Signed)
Called and spoke with patient, he states that he was under the impression that an order for pulmonary rehab was going to be placed since he started back with physical therapy. Advised patient that I would get a message over to SN to talk about this.    SN please advise, thank you.

## 2018-06-19 NOTE — Telephone Encounter (Signed)
Called and spoke with Patient about restarting pulmonary rehab.  Made Patient aware that SN was out of the office until Wednesday, 06/24/18.  Patient has upcoming appointment with SN, 06/25/18, at 1130.  Patient feels that physical therapy has went well, feels stronger.  He feels that he is ready to start pulmonary rehab and eventually start back to the gym.  Patient stated that he will discuss it at his follow up visit, 06/25/18.

## 2018-06-22 ENCOUNTER — Other Ambulatory Visit: Payer: Self-pay | Admitting: Pulmonary Disease

## 2018-06-22 ENCOUNTER — Ambulatory Visit: Payer: Medicare Other | Admitting: Physical Therapy

## 2018-06-22 DIAGNOSIS — R2689 Other abnormalities of gait and mobility: Secondary | ICD-10-CM | POA: Diagnosis not present

## 2018-06-22 DIAGNOSIS — R2681 Unsteadiness on feet: Secondary | ICD-10-CM | POA: Diagnosis not present

## 2018-06-22 DIAGNOSIS — M6281 Muscle weakness (generalized): Secondary | ICD-10-CM

## 2018-06-22 MED ORDER — APIXABAN 5 MG PO TABS: 5.0000 mg | ORAL_TABLET | Freq: Two times a day (BID) | ORAL | 3 refills | Status: DC

## 2018-06-22 NOTE — Telephone Encounter (Signed)
Per SN: no need to call the patient since we will be discussing this at his visit.  Will inform patient that he needs to complete all the [facility] first; then we can refer to [facility] when ready.

## 2018-06-22 NOTE — Addendum Note (Signed)
Addended by: Alvina Filbert B on: 06/22/2018 04:22 PM   Modules accepted: Orders

## 2018-06-23 ENCOUNTER — Encounter: Payer: Self-pay | Admitting: Physical Therapy

## 2018-06-23 NOTE — Therapy (Signed)
[facility] [address] [place], [place], [address] Phone: [phone]   Fax:  [phone]  Physical Therapy Treatment  Patient Details  Name: Andrew [name] MRN: [id number] Date of Birth: [birth year] Referring Provider: Dr. Teressa Lower    Encounter Date: 06/22/2018  PT End of Session - 06/23/18 1804    Visit Number  9    Number of Visits  25    Date for PT Re-Evaluation  07/05/18    Authorization Type  Medicare    Authorization Time Period  3-53-61 - 4-43-; recertification 1/54/00 to 08/09/2018    PT Start Time  1148    PT Stop Time  1230    PT Time Calculation (min)  42 min       Past Medical History:  Diagnosis Date  . Anemia   . BiPAP (biphasic positive airway pressure) dependence    Pt denies history of OSA  . BPH (benign prostatic hyperplasia)   . Cancer ([place])    skin  . COPD (chronic obstructive pulmonary disease) ([place])   . Dysphagia   . Emphysema lung ([place])   . GERD (gastroesophageal reflux disease)    " Silent reflux"  . Hearing loss    right ear  . History of hiatal hernia   . HLD (hyperlipidemia)   . Hypertension   . Oxygen dependent    2-3 liters  . Oxygen dependent   . Pneumonia   . Pulmonary hypertension ([facility] [place])   . Sleep apnea    wears cpap    Past Surgical History:  Procedure Laterality Date  . CARDIAC CATHETERIZATION    . CATARACT EXTRACTION W/ INTRAOCULAR LENS  IMPLANT, BILATERAL    . COLONOSCOPY WITH PROPOFOL N/A 04/20/2018   Procedure: COLONOSCOPY WITH PROPOFOL;  Surgeon: Wilford Corner, MD;  Location: [facility];  Service: Endoscopy;  Laterality: N/A;  . ESOPHAGOGASTRODUODENOSCOPY (EGD) WITH PROPOFOL N/A 08/23/2016   Procedure: ESOPHAGOGASTRODUODENOSCOPY (EGD) WITH PROPOFOL;  Surgeon: Wilford Corner, MD;  Location: [facility];  Service: Endoscopy;  Laterality: N/A;  . ESOPHAGOGASTRODUODENOSCOPY (EGD) WITH PROPOFOL N/A 04/20/2018   Procedure: ESOPHAGOGASTRODUODENOSCOPY (EGD)  WITH PROPOFOL;  Surgeon: Wilford Corner, MD;  Location: [facility];  Service: Endoscopy;  Laterality: N/A;  . HOT HEMOSTASIS N/A 04/20/2018   Procedure: HOT HEMOSTASIS (ARGON PLASMA COAGULATION/BICAP);  Surgeon: Wilford Corner, MD;  Location: [facility];  Service: Endoscopy;  Laterality: N/A;  . LUNG SURGERY    . POLYPECTOMY  04/20/2018   Procedure: POLYPECTOMY;  Surgeon: Wilford Corner, MD;  Location: [facility] ENDOSCOPY;  Service: Endoscopy;;  . TONSILLECTOMY      There were no vitals filed for this visit.  Subjective Assessment - 06/23/18 1251    Subjective  Pt reports no problems since last week's visit - pt states his oxgyen is on 2L but he will increase it to 3L when he starts exercising    Pertinent History  Chronic hypoxemic respiratory failure, pulmonary HTN, centrilobular emphysema, paroxysmal atrial fibrillation:  COPD    Patient Stated Goals  improve strength and endurance to be able to participate in pulmonary rehab and then to return to [facility] for exercise    Currently in Pain?  No/denies                       [facility] Adult PT Treatment/Exercise - 06/23/18 0001      Transfers   Transfers  Sit to Stand    Number of Reps  Other reps (comment) 5  Comments  no UE support used      Ambulation/Gait   Ambulation/Gait  Yes    Ambulation/Gait Assistance  6: Modified independent (Device/Increase time)    Ambulation/Gait Assistance Details  Pt amb. 3" x 1 and then 2"x 1  on treadmill at 1.3 mph with bil. UE support with 2% elevation on both reps;   O2 sat rate 88% after this activity                              Gait Pattern  Step-through pattern    Ambulation Surface  Other (comment) TREADMILL    Stairs  Yes    Stairs Assistance  5: Supervision    Stairs Assistance Details (indicate cue type and reason)  O2 Sa 86% after 5 reps step negotiation pt required seated rest period    Stair Management Technique  Two rails;Alternating pattern    Number of Stairs  4 5  reps = 20 steps total    Height of Stairs  6      Knee/Hip Exercises: Aerobic   Tread Mill  1.3 mph on treadmill - see details under gait above          Balance Exercises - 06/23/18 1434      Balance Exercises: Standing   Sidestepping  1 rep 15' x 2 reps with rest period - squats added w/sidesteppingi    Other Standing Exercises  Pt performed marching in place 10 reps each leg;  marching with opposite UE flexion for incr. cardio demand; O2 Sa 89% after this activity:       tap ups to 1st step 10 reps each leg without UE support; 5 reps each foot to 2nd step for alternate tap ups  [employer] step used - pt performed forward step up with hip and knee flexion - 3 reps on each leg;  O2 Sa rate 88% after this activity Then pt perform lateral step up with hip abduction kick on non-stance LE - 3 reps each leg; O2 Sa rate 86% after this activity   Pt performed standing hip flexion, extension and abduction 10 reps each leg with UE support on counter prn with CGA    PT Short Term Goals - 06/15/18 1151      PT SHORT TERM GOAL #1   Title  Patient will tolerate continuous 15 minutes on Nustep using all 4 extremities with steps/minute >60, SaO2>=88% and RPE <=14 (Target all STGs 06/18/2018-date extended due to 2 week gap in appts due to high census)    Baseline  06/15/18: met today in session with SaO2 >/=91% and RPE11 after 15 minutes with >/= 60 steps per minute    Status  Achieved      PT SHORT TERM GOAL #2   Title  Patient will ambulate 5 consecutive minutes on treadmill with SaO2>=88% and RPE <=14    Baseline  06/15/18: SaO2 91%, RPE= 11,  1.3 mph x 5 mintutes with 1-2 hand hold    Status  Achieved      PT SHORT TERM GOAL #3   Title  Patient will tolerate up/down 4 steps x 2 (total 8 steps) with SaO2>=88% and RPE <=14    Baseline  06/15/18: met today with SaO2 91%, RPE <10    Status  Achieved        PT Long Term Goals - 06/23/18 1807      PT LONG TERM GOAL #1  Title  Pt will amb. 4"  nonstop to demo improved activity tolerance.  (Target for LTGs updated to 07/16/18--date extended due to 2 week lapse in appts due to high census)      PT LONG TERM GOAL #2   Title  Pt will perform 5 times sit to stand in </= 8 secs to demo increased strength and endurance.      PT LONG TERM GOAL #3   Title  Pt will negotiate 12 step with 1 rail using a step over step sequence with PRE rating </= 4/10 intensity.      PT LONG TERM GOAL #4   Title  Independent in HEP for strengthening and balance exercises.       PT LONG TERM GOAL #5   Title  Pt will be able to participate in pulmonary rehab upon D/C from PT.            Plan - 06/23/18 1840    Clinical Impression Statement  Pt progressing well towards goals; pt increased activity to negotiating the  4 steps 5 reps continuously without rest break.  Pt also amb. on treadmill 5" total time at 2% elevation.  Step negotiation results in greatest decrease in SaO2 rate - drops to upper 80's but increases into 90's with seated rest period with pt performing pursed lip breathing.    Rehab Potential  Good    PT Frequency  2x / week    PT Duration  8 weeks    PT Treatment/Interventions  ADLs/Self Care Home Management;Therapeutic exercise;Therapeutic activities;Gait training;Stair training;Balance training;Neuromuscular re-education;Patient/family education;Energy conservation    PT Next Visit Plan  10th visit progress note due next session; cont endurance and balance activities; pt to discuss pulmonary rehab with MD at appt on 06-25-18    PT Home Exercise Plan  gave balance exercises on 04-23-18 - kicks, SLS, sidestepping and marching    Consulted and Agree with Plan of Care  Patient       Patient will benefit from skilled therapeutic intervention in order to improve the following deficits and impairments:  Difficulty walking, Decreased balance, Decreased activity tolerance, Cardiopulmonary status limiting activity, Decreased endurance  Visit  Diagnosis: Muscle weakness (generalized)  Other abnormalities of gait and mobility  Unsteadiness on feet     Problem List Patient Active Problem List   Diagnosis Date Noted  . Iron deficiency anemia, unspecified 04/20/2018  . PVCs (premature ventricular contractions) 02/02/2018  . Pressure injury of skin 01/18/2018  . Influenza-like illness 01/17/2018  . History of pneumonia 12/16/2017  . OSA on CPAP 09/22/2017  . Paroxysmal atrial fibrillation (HCC)   . Malnutrition of moderate degree 09/10/2017  . Protein-calorie malnutrition, severe 12/09/2016  . Dysphagia 08/23/2016  . Normocytic anemia 06/28/2016  . Pressure sore on buttocks 06/28/2016  . Pulmonary hypertension ([place]) 02/20/2016  . GERD (gastroesophageal reflux disease) 02/20/2016  . COPD with acute exacerbation ([place]) 02/13/2016  . Acute respiratory failure with hypoxia ([place]) 02/12/2016  . Hypertension 02/11/2016  . Hyperlipidemia 02/11/2016  . CAP (community acquired pneumonia)   . BPH (benign prostatic hyperplasia) 12/29/2015  . COPD with emphysema ([place]) 10/02/2015  . Chronic hypoxemic respiratory failure ([place]) 10/02/2015  . History of pulmonary hypertension 10/02/2015    DildayJenness Corner, PT 06/23/2018, 6:46 PM  [facility] [address] [place], [place], [address] Phone: [phone]   Fax:  [phone]  Name: Andrew [name] MRN: [id number] Date of Birth: 1940/12/09

## 2018-06-24 ENCOUNTER — Telehealth (HOSPITAL_COMMUNITY): Payer: Self-pay

## 2018-06-24 DIAGNOSIS — G4700 Insomnia, unspecified: Secondary | ICD-10-CM | POA: Diagnosis not present

## 2018-06-24 DIAGNOSIS — I10 Essential (primary) hypertension: Secondary | ICD-10-CM | POA: Diagnosis not present

## 2018-06-24 DIAGNOSIS — I482 Chronic atrial fibrillation: Secondary | ICD-10-CM | POA: Diagnosis not present

## 2018-06-24 DIAGNOSIS — D509 Iron deficiency anemia, unspecified: Secondary | ICD-10-CM | POA: Diagnosis not present

## 2018-06-24 DIAGNOSIS — J439 Emphysema, unspecified: Secondary | ICD-10-CM | POA: Diagnosis not present

## 2018-06-24 DIAGNOSIS — J9611 Chronic respiratory failure with hypoxia: Secondary | ICD-10-CM | POA: Diagnosis not present

## 2018-06-24 DIAGNOSIS — Z79899 Other long term (current) drug therapy: Secondary | ICD-10-CM | POA: Diagnosis not present

## 2018-06-24 NOTE — Telephone Encounter (Signed)
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

## 2018-06-24 NOTE — Telephone Encounter (Signed)
Called patient to see if he was interested in participating in the Pulmonary Rehab Program. Patient stated yes. Patient will come in for orientation on 07/06/18 @ 1:30PM and will attend the 10:30AM exercise class.  Mailed homework package.

## 2018-06-25 ENCOUNTER — Encounter: Payer: Self-pay | Admitting: Pulmonary Disease

## 2018-06-25 ENCOUNTER — Ambulatory Visit (INDEPENDENT_AMBULATORY_CARE_PROVIDER_SITE_OTHER): Payer: Medicare Other | Admitting: Pulmonary Disease

## 2018-06-25 ENCOUNTER — Other Ambulatory Visit: Payer: Self-pay | Admitting: *Deleted

## 2018-06-25 VITALS — BP 116/60 | HR 82 | Temp 98.1°F | Ht 67.0 in | Wt 143.6 lb

## 2018-06-25 DIAGNOSIS — I480 Paroxysmal atrial fibrillation: Secondary | ICD-10-CM

## 2018-06-25 DIAGNOSIS — Z8701 Personal history of pneumonia (recurrent): Secondary | ICD-10-CM

## 2018-06-25 DIAGNOSIS — I493 Ventricular premature depolarization: Secondary | ICD-10-CM | POA: Diagnosis not present

## 2018-06-25 DIAGNOSIS — I10 Essential (primary) hypertension: Secondary | ICD-10-CM

## 2018-06-25 DIAGNOSIS — Z9989 Dependence on other enabling machines and devices: Secondary | ICD-10-CM

## 2018-06-25 DIAGNOSIS — G4733 Obstructive sleep apnea (adult) (pediatric): Secondary | ICD-10-CM

## 2018-06-25 DIAGNOSIS — Z8679 Personal history of other diseases of the circulatory system: Secondary | ICD-10-CM

## 2018-06-25 DIAGNOSIS — J432 Centrilobular emphysema: Secondary | ICD-10-CM

## 2018-06-25 DIAGNOSIS — J9611 Chronic respiratory failure with hypoxia: Secondary | ICD-10-CM

## 2018-06-25 MED ORDER — APIXABAN 5 MG PO TABS: 5.0000 mg | ORAL_TABLET | Freq: Two times a day (BID) | ORAL | 1 refills | Status: DC

## 2018-06-25 MED ORDER — AZITHROMYCIN 250 MG PO TABS: ORAL_TABLET | ORAL | 2 refills | Status: DC

## 2018-06-25 NOTE — Patient Instructions (Signed)
Today we updated your med list in our EPIC system...    Continue your current medications the same...  We wrote for a ZPak to keep on hand for the onset of any infectious symptoms...  Good luck with the return to Pulmonary Rehab...  Call for any questions...  Let's plan a follow up visit in ~85mo, sooner if needed for problems.Marland KitchenMarland Kitchen

## 2018-06-25 NOTE — Progress Notes (Addendum)
Subjective:     Patient ID: Andrew [name], male   DOB: 1941/08/10, 77 y.o.   MRN: [id number]  HPI 77 y/o WM, an ex-smoker quit in 1986, w/ severe bullous emphysema & hx of RUL bullous resection in 2007; Hx both ?hypercarbic & +hypoxemic resp failure w/ cor pulmonale & secondary pulm HTN on Revatio x yrs; He has been stable for >10 yrs on the same pulm regimen as he has travelled around the [place] living in [place], [place], and [place]...  ~  October 02, 2015:  Initial pulmonary evaluation by SN>  His PCP is Dr. Lujean Amel, Andrew [name]...       Andrew [name] is a 77 y/o gentleman from [place]- moved here to [place] ~45moago to live w/ his daughter & son-in-law;  He has a long convoluted history & we have none of his prev objective data to review;  He tells me that he has known about COPD/Emphysema for >10 yrs and in 2007 he had right thoracotomy & "bleb-ectomy";  After this procedure he was placed on Oxygen at 2L/min and BiPAP to use at night, along w/ ADVAIR250Bid & SPIRIVA daily, plus REVATIO20Tid for pulmonary HTN;  He was also treated w/ NEBS for about 1-245yrthen this was discontinued;  He has pretty much been on this same regimen for the past 9 yrs w/o much change, despite or maybe because he has moved around a lot- [name], [name]...  He describes himself as being rock-solid stable on this exact regimen since 2007- he had Cath (?left & right heart) in 2007, told 1 blockage, good LVF ?right heart results, started on O2, BiPAP, and Revatio but he does not know why?  He notes min cough when supine & in early AM attributed to reflux; min if any sput production, no hemoptysis, he denies SOB but states DOE "if I over-exert" eg- walking, lifting/carrying, stairs, etc; he notes that ADLs are ok- no problem (he is stoic);  He denies CP, palpit, f/c/s, edema... He hasn't been  to an ER since 2007 he says & that was also the last time he had any Pred; he thinks he had CXR, PFT, 2DEcho all earlier this yr...   Smoking Hx>  He is an ex-smoker, started in his teens, smoked for 30 yrs up to 1ppd, quit in 1986; This is a 30 pack-yr hx, he does not recall ever being checked for A1AT defic...  Pulmonary Hx>  COPD/ Emphysema w/ right upper lobe "bleb[place] hypoxemic resp failure on O2 at 2L/min since 2007;  He tells me that he was started on BiPAP about that same time but he doesn't know why- never had sleep study, not on CPAP prev, he does not know about pCO2 levels etc ("I like the fresh cool air");  [name] he does not know the settings, machine never downloaded, etc;  He has also been on Revatio20Tid since 2007, apparently never tried on other meds, dose never adjusted- he knows about "pulmonary hypertension" but he doesn't know any details and it doesn't appear to have been followed up, and meds kept the same from doctor to doctor...   Medical Hx>  HBP, ?nonobstructive CAD, HL, thyroid nodule, GERD, constipation, BPH, insomnia...  Family Hx>  Father died w/ Emphysema &  was a former smoker; no other hx lung dis in the family; Alpha-1 status is unknown...  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   Current Meds>  Oxygen 2L/min pulse-dose concentrator, Advair250Bid, Spiriva daily, Revatio20Tid, CardizemCD240, Crestor10, Nexium20, Proscar5, Restoril30...  EXAM shows Afeb, VSS, O2sat=93% on 2L/min pulse-dose;  Heent- neg, mallampati1;  Chest- decr BS at bases, can't augment BS voluntarily, w/o w/r/r;  Heart- RR w/o m/r/g;  Abd- soft, neg;  Ext- neg w/o c/c/e;  Neuro- intact...  CXR 10/02/15 showed norm heart size,  COPD, bullous emphysema/ hyperinflation, scarring right apex, NAD...   Spirometry 10/02/15 showed FVC=2.70 (69%), FEV1=1.12 (37%), %1sec=41, mid-flows reduced at 18% predicted; this is c/w severe airflow obstruction & GOLD Stage 3 COPD  Ambulatory oxygen saturation test 10/02/15> on O2 at 2L/min pulse-dose concentrator: O2sat=96% on 2L at rest w/ pulse=87; he walked 2 laps w/ his O2, stopped due to dyspnea, lowest O2sat=89% w/ pulse=113/min...  LAB 10/02/15>  Alpha-1-Antitrypsin level => pending (he never went to the lab for this blood test)  2DEchocardiogram 10/09/15 showed norm LVF w/ EF=55-60%, norm wall motion, mild MR, mild RA dil, PAsys est 66mHg... Pt on Revatio 20Tid x 934yr& I rec we wean slowly (Decr to Bid now)...    IMP >>     COPD/ bullous emphysema> s/p RUL "bleb-ectomy" in 2007, severe airflow obstruction w/ GOLD Stage3 COPD> on Advair250Bid & Spiriva daily; apparently he has no use for a rescue inhaler; prev on NEB w/ Albut but not for several yrs.     Chronic hypoxemic respiratory failure on O2 at 2L/min via pulse-dose concentrator...    Pt reports using BiPAP since 2007, never been on CPAP, never had sleep study he says, unknown ABGs or pCO2 data; machine from [name] will try to get the old data => none received.    Hx pulmonary hypertension on Revatio20Tid since 2007 w/o additional med trials or dose adjustments> we do not have any of the objective data from his prev physician teams... Current 2DEcho w/ PAsys est 3626m & we will wean the Revatio to Bid at this point => he does not want to wean further for "other" reasons...    Medical issues include:  HBP, ?nonobstructive CAD, HL, thyroid nodule, GERD, BPH, insomnia... PLAN >>     MarOmids a distinctly patchy history to go along w/ his severe airflow obstruction & bullous emphysema;  We really need his old objective data from 2007 when he was started on O2 & BiPAP after his RUL bleb reduction surg;  He will try to get  names and numbers for us-[place] the meanwhile we will [place] [facility] [place] [place] more recent data as we establish out [place] that he wants us [place] continue his current regimen which has served him well over the last 70yr270yrContinue Advair250Bid, Spiriva daily, Revatio20Tid=>Bid, O2 at 2L/min, and the BiPAP nightly as currently set... We plan ROV recheck in 1 month... NOTE> 2DEcho w/ PAsys est ~36mm40m we will slowly wean Revatio...  ~  November 01, 2015:  20mo R64mo MrBowman reports stable, doing satis & notes no untoward effects from cutting the Revatio to 20mgBi49me denies CP, palpit, incr SOB, edema, etc; he  continues on the O2 at 2L/min, BiPAP from [place], [place] once daily; we have not received any records from his mult physicians ([place], [place], [place])...    EXAM shows Afeb, VSS, O2sat=92% on 2L/min pulse-dose;  Heent- neg, mallampati1;  Chest- decr BS at bases, can't augment BS voluntarily, w/o w/r/r;  Heart- RR w/o m/r/g;  Abd- soft, neg;  Ext- neg w/o c/c/e;  Neuro- intact... IMP/PLAN>>  See prob list above- he is stable on this regimen but does not want to wean the Revatio further for "other" reasons; OK to continue current med regimen- advised regular exercise vs pulm rehab program; given ZPak for prn use over the winter & he knows to call for any resp issues, incr dyspnea, etc... He remains on BiPAP but we do not have any data- no records received from prev pulm physicians, no notes from [place], no download data from his machine- we will again try to make contact w/ his DME company... We plan ROV in 3-67mo  ~  Mar 18, 2016:  4-542moOV & post hosp visit>  MaYeseniaas severe COPD/Emphysema, GOLD Stage 3 w/ FEV1=1.12L (37%predicted) [id number] Hx right thoracotomy & "bleb-ectomy" in 2007;  after this procedure he says he was placed on Oxygen at 2L/min and BiPAP to use at night, along w/ ADVAIR250Bid, SPIRIVA  daily, and REVATIO20Tid for pulmonary HTN (2DEcho here 11/16 showed PAsys est=3643m)-- we do not have any of that data from [place] have been unsuccessful in obtaining old data from any of his prev physicians); he has been resistent to any adjustment in his medication regimen for various reasons...     He's been [facility]> [facility] by [facility] w/ CAP- CXR showed severe bullous emphysema, incr markings in RLL and atelectasis in R-mid lung, Temp 103, Lactate=2.4, WBC 16K; treated w/ O2, Solumedrol=>Pred, Zosyn/Vanco=>Levaquin, NEBs, etc; disch home w/ home health help- ?seen by his PCP after disch...    [facility] w/ 1d hx incr SOB, wheezing, productive cough & felt to have a COPD exac; CXR showed his COPE/E, persistent incr markings in right base, WBC was elev at 20K, BNP=60, no pos cultures; he was treaed w/ O2, Solumedrol, Roceph/Zithro, NEBs, etc; he was disch on Levaquin & Pred; he developed urinary retention & foley placed (weaned off in NH); he was debilitated & sent to NH for rehab...     [facility] & attended by PieSunGardte dated 03/01/16 reviewed-- finished Levaquin, weaned off the Pred, they were able to discontinue the foley & he passed voiding trial; disch home after 19d in rehab...     Now back home on same O2= 2L/min days & 3L/min night w/ BiPAP ?settings (he has been on this for yrs and never re-assessed), NEB w/ AlbutTid, Advair250Bid, Spiriva daily, Revatio20Bid (pt refuses to taper this med further);  He has developed pedal edema x3d & needs a low sodium diet + diuretic started but he tells me he is sched to see his PCP soon for this problem...    EXAM shows Afeb, VSS, O2sat=95% on 2L/min pulse-dose;  Heent- neg, mallampati1;  Chest- decr BS at bases, can't augment BS voluntarily, w/o w/r/r;  Heart- RR w/o m/r/g;  Abd- soft, neg;  Ext- neg w/o c/c/e;  Neuro- intact...  CXR 02/11/16 showed  hyperinflation, bullous emphysema, some scarring in right mid lung & both lower lobes, no  infiltrate, no edema...  LABS 01/2016> all reviewed in Epic... IMP/PLAN>>  Andrew [name] has had a protracted resp exac- triggered by prob RLL pneumonia (NOS) superimposed on his severe COPD/bullous emphysema;  Rec to change the Albut for Neb to [place] & continue treatments Tid; continue other meds regularly as outlined;  He is referred to [facility] & hopes to start soon;  We will plan ROV in 38mosooner if needed.  ~  June 20, 2016:  365moOV w/ SN>  Andrew [name] returns for a 46m47moV & states that he is doing very well- no new complaints or concerns at this time, his PCP is DrKoirala; [facility] & continues in this program at present; Pt prev [facility] about treatments at [facility] [place] regenerative [facility] tissue (stem cell therapy) which costs ~$10K per treatment & all benefits are antecdotal; I [facility] if he wants cutting edge research approach...     EPIC records indicate ER visit 05/08/16 for Abd Pain> VSS, exam was neg, CT Abd showed large fecal burden otherw neg, distended urinary bladder, atherosclerosis, bilat L5 pars defects; Rec to take laxatives...     Severe COPD- GOLD Stage3, bullous emphysema, s/p resection of RUL bleb in 2007> on Advair250Bid & Spiriva daily; he has NEB w/ Duoneb for prn use; Spirometry 09/2015 w/ FEV1=1.12 (37%); he is enrolled in PulOhiowants a POC instead of tanks;     Chronic hypoxemic respiratory failure on O2 at 2L/min via POC> ambulatory O2sats 09/2015 dropped to 89% on 2L/min after 2 Laps.    Hx prob hypercarbic resp failure inferred from Pt report of BiPAP ([facility]) since 2007, never been on CPAP, never had sleep study, unknown ABGs or pCO2 data>    Hx cor pulmonale/ pulmonary hypertension on Revatio20Tid since 2007 w/o additional med trials or dose adjustments> we do not have any of the objective data from his prev  physician teams; 2DEcho here 09/2015 showed PAsys=36 and we tried to wean his Revatio but he refused due to "other reasons", finally compromised on Revatio20Bid...    Hx CAP- Hosp 12/2015 w/ RLL opac (nos) & responded to broad spectrum Ab coverage + Sulomedrol, O2, Nebs, etc; readmitted 01/2016 w/ COPD exac- similar Rx but sent to NH for rehab at disch...    Cardiac issues>  ?nonobstructive CAD, cor pulmonale w/ mild pulmHTN & 2DEcho 09/2015 showing norm LVF w/ EF=55-60%, norm wall motion, mild MR, mild RA dil, PAsys est 75m11m..    Medical issues include:  HBP (on CardizemCD240), HL (on Cres10), hx thyroid nodule, GERD (on Nexium40), Constipation, BPH (on Proscar5 & Flomax0.4) w/ hx urinary retention, insomnia (on Restoril30)... EXAM shows Afeb, VSS, O2sat=93% on 2L/min pulse-dose;  Heent- neg, mallampati1;  Chest- decr BS at bases, can't augment BS voluntarily, w/o w/r/r;  Heart- RR w/o m/r/g;  Abd- soft, neg;  Ext- neg w/o c/c/e;  Neuro- intact...  CXR 05/08/16>  Mod bullous emphysema w/ chr changes at the lung bases w/ pleuroparenchymal scarring, surg suture lines ove the right mid lung...  LABS 04/2016 in epic> Chems- ok, BS=149;  CBC- anemia w/ Hg= 10.4-11.7, WBC=15K IMP/PLAN>>  Andrew [name] is stable on his baseline regimen + pulm rehab, etc; rec to continue current meds and exercise; he needs the 2017 Flu vaccine when avail & will call prn any breathing problems; we plan ROV in 5mo.145mo ADDENDUM>>  Pt Hosp by [facility] 8/11 - 07/01/16 w/  CAP after presenting w/ incr SOB, decr O2sats at pulm rehab, & chills 9he lives w/ school aged grandkids)- CXR showed ?RLL opac, cultures neg & NOS- treated empirically w/ Rocephin & Zithromax, Solumedrol, O2, NEBS, etc; he improved gradually & disch home;  He went to see ENT 8/29 because he thought the pneumonia may have been from reflux & dysphagia- fiberoptic exam revealed some edema & pooling of secretions c/w LPR=> placed on PPI Bid and referred to GI;  Barium esophagram  showed a [place] & mod GERD but no esophagitis mass or stricture;  He saw DrSchooler & had EGD 08/23/16 showing a Zebulon, patchy candida=> treated w/ Nystatin, later required Diflucan...   ADDENDUM>>  Pt had colonoscopy 08/23/16 by DrSchooler-- small HH, patchy candida, mild gastitis seen;  Treated w/ Nystatin, continue Prilosec40/d...   ~  November 25, 2016:  78moROV w/ SN>  Andrew [name] returns c/o 3d hx cough, lots of clear whitish sput, increased weakness and SOB "even on my oxygen"; symptoms started w/ a slight sore throat, hoarse, cough as noted but no f/c/s; he has been going to the Gym 5d per week to continue his exercise since graduating from the formal pulm rehab program (finished 07/2016) & is doing very well by his estimate (until this URI); he notes that 3d ago he had his usual 2H work out, went to tWellsite geologist then went to lunch & did all this w/o difficulty...      Severe COPD- GOLD Stage3, bullous emphysema, s/p resection of RUL bleb in 2007> on Advair250Bid & Spiriva daily; he has NEB w/ Duoneb for prn use; Spirometry 09/2015 w/ FEV1=1.12 (37%); he finished his PulmRehab 07/2016 & now exercises 5d/wk at the gym; he finally got his POC that he wanted...    Chronic hypoxemic respiratory failure on O2 at 2L/min via POC> ambulatory O2sats 09/2015 dropped to 89% on 2L/min after 2 Laps; repeat 11/2016 dropped to 85%after 1Lap...    Hx prob hypercarbic resp failure inferred from Pt report of BiPAP ([facility]) since 2007, never been on CPAP, never had sleep study, unknown ABGs or pCO2 data> we never received records from former physician teams or any of his old records...     Hx cor pulmonale/ pulmonary hypertension on Revatio20Tid since 2007 w/o additional med trials or dose adjustments> we do not have any of the objective data from his prev physician teams; 2DEcho here 09/2015 showed PAsys=36 and we tried to wean his Revatio but he refused due to "other reasons", finally compromised on Revatio20Bid...    Hx CAP-  Hosp 12/2015 w/ RLL opac (nos) & responded to broad spectrum Ab coverage + Solumedrol, O2, Nebs, etc; readmitted 01/2016 w/ COPD exac- similar Rx but sent to NH for rehab at disch...    Cardiac issues>  ?nonobstructive CAD, cor pulmonale w/ mild pulmHTN & 2DEcho 09/2015 showing norm LVF w/ EF=55-60%, norm wall motion, mild MR, mild RA dil, PAsys est 372mg...    Medical issues include:  HBP (on CardizemCD240), HL (on Cres10), hx thyroid nodule, GERD & prob LPR (on Nexium40), Constipation, BPH (on Proscar5 & Flomax0.4) w/ hx urinary retention, insomnia (on Restoril30)...       NOTE> pt is Iron deficient (see below) & needs to f/u w/ his PCP & GI for this...  EXAM shows Afeb, VSS, O2sat=92% on 3L/min;  Heent- neg, mallampati1;  Chest- decr BS at bases, can't augment BS voluntarily, w/o w/r/r;  Heart- RR w/o m/r/g;  Abd- soft, neg;  Ext- neg w/o  c/c/e;  Neuro- intact...  CXR 06/28/16>  Hyperinflation, marked emphysematous changes, crowed lung markings at the bases, no consolidation or effusions...  CXR 11/25/16>  COPD, bullous emphysema, and bilat pleuroparenchymal changes c/w scarring, no acute abnormality  Ambulatory O2sat on 3L/min pulse-dose POC>  O2sat=98% on 3L/min pulse-dose at rest w/ HR=90/min;  He ambulated only 1Lap (185') w/ O2sat drop to 85% w/ HR=108/min...  LABS 11/25/16>  Chems- wnl x BS=108;  A1c=6.1;  CBC- Hg=12.3, mcv=90, WBC=7.8K;  Fe=31 (6.6%sat) & Ferritin=8.7;  B12=661 IMP/PLAN>>  There is no such thing as a mild exac for MrBowman- even the mildest of URIs can cause a severe COPD exac- we discussed Rx w/ Levaquin500 x7d, Pred80m- 5d taper (see AVS), + Diflucan100 per his request; hopefully he will respond but he has little in the way of reversible factors given his severe emphysema; [facility] symptoms worsen despite therapy; as noted he is Fe defic & needs further eval per his PCP & GI- copies sent...  ~  December 23, 2016:  163moOV & post hospital  check>  [name] by TrDiona Browner/ a COPD exac w/ acute on chronic resp failure;  Pt already on Home O2 at 2L/min and BiPAP Qhs; he had a root canal done, developed a sore throat, then cough, etc; Levaquin & Pred called -in for pt, Hosp for 4d w/ IV solumedrol & ultimately disch on Pred10... Currently c/o DOE w/ ADLs which is new to him as prev he was going to the gym etc...     He is currently taking Duoneb Q6H as needed + Advair250Bid, Spiriva via handihaler daily, Pred 1060mabs- slow taper... EXAM shows Afeb, VSS, O2sat=94% on 2L/min;  Heent- neg, mallampati1;  Chest- decr BS at bases, can't augment BS voluntarily, w/o w/r/r;  Heart- RR w/o m/r/g;  Abd- soft, neg;  Ext- neg w/o c/c/e;  Neuro- intact...  CXR 12/07/16 (independently reviewed by me in the PACS system)> norm heart size, calcif Ao, severe COPD w/ apical bullous changes & surg staples on right, NAD... Andrew [name] KitchenMarland KitchenKG 12/07/16 showed NSR, rate 87, poor R prog V1-2, otherw WNL... Andrew [name] KitchenMarland KitchenABS 11/2016> NOT retaining CO2 (ABG w/ pCO2=39;  Chems- ok;  CBC- ok w/ Hg=11-12 and wbc=21=>12;  BNP= wnl... IMP/PLAN>>  Andrew [name] had a COPD exac & required IV Solumedrol + in hosp attention w/ NEBS etc & now he is approaching his baseline; he called for an order for a Hosp bed but was referred to his PCP for this determination;  We reviewed the NEED for DUONEB Tid followed by Advair250Bid & Spiriva once daily; we will wean his Pred from 72m79mto 5mg/5m plan ROV receheck in 6wks...  NOTE: >50% of this 25min73m was spent in counseling & coordination of care...  ~  January 28, 2017:  6wk ROV & Yadkinable on meds + Pred 5mg/d;38me called 01/15/17 for antibiotics for an infected tooth- called in Augmentin & tooth has been extracted by dentist;  He remains on NEBs w/ Duoneb Tid, followed by Advair250Bid and Spiriva daily;  He reports using his Revatio "prn";  His breathing continues to improve, back in the gym 3d/wk, approaching his baseline he says;  Denies  cough, sput, hemoptysis, CP, etc... We reviewed his prob list as above...  EXAM shows Afeb, VSS, O2sat=96% on 2L/min;  Heent- neg, mallampati1;  Chest- decr BS at bases, can't augment BS voluntarily, w/o  w/r/r;  Heart- RR w/o m/r/g;  Abd- soft, neg;  Ext- neg w/o c/c/e;  Neuro- intact... IMP/PLAN>>  Andrew [name] continues to improve & move toward his baseline; Rec to continue Pred72m/d thru March then decr to 51mQod til return visit in 45m72mo  ~  May 01, 2017:  45mo38mo & Andrew [name] reports a good 45mo 36morval w/o acute exac and w/o new complaints or concerns; he is back in the gym 3d/wk & back to baseline he says;  As prev noted Andrew [name] is on BiPAP & has been for yrs- [place] & I presume this was done for COPD & CO2 retention ant NOT because of sleep apnea; he does not know his settings 7 we have been unable to get old records indicating his initial parameters or follow up monitoring; cureent problem is that his machine is old, mask is 19yr [id number] tubing supplies in a long time;  For his part he says he does NOT rest well, just in short intervals;  He goes to bed at 11PM using his BiPAP; wakes at 3-4AM & can't get back to sleep; maybe eats breakfast at 4AM and drifts back to sleep (w/o BiPAP) until 7AM or so; similarly he might nap for 1H during the day (w/o BiPAP); he currently thinks that the BiPAP makes no difference in his sleep & it would just as well suit him to stop it if able...  WE DISCUSSED REASSESSMENT w/ ABG on RA, and a HOME SLEEP TEST on RA to evaluate for sleep apnea and hypercarbic/ hypoxemic resp failure...     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    Chronic hypoxemic respiratory failure on O2 at 2L/min via POC> ambulatory O2sats 09/2015 dropped to 89% on 2L/min after 2 Laps; repeat 11/2016 dropped to 85% after 1Lap...    Hx prob hypercarbic resp failure inferred from Pt report of BiPAP ([facility]) since 2007, never been on CPAP, never had sleep study> we never received records from former physician teams or any of his old records; ABGs in EPIC> [facility] pH=7.44/ pCO2=35/ pO2=69 ?on RA; 11/2016 pH=7.44/ pCO2=39/ pO2=146 on 4Lnc;   => he has agreed to re-assessment here 2018 w/ ABG on RA and Home Sleep Study => pending...    Hx cor pulmonale/ pulmonary hypertension on Revatio20Tid since 2007 w/o additional med trials or dose adjustments> we do not have any of the objective data from his prev physician teams; 2DEcho here 09/2015 showed PAsys=36 and we tried to wean his Revatio but he refused due to "other reasons", finally compromised on Revatio20Bid...    Hx CAP- Hosp 12/2015 w/ RLL opac (nos) & responded to broad spectrum Ab coverage + Solumedrol, O2, Nebs, etc; readmitted 01/2016 w/ COPD exac- similar Rx but sent to NH for rehab at disch...    Cardiac issues> no cardiologist> ?nonobstructive CAD, cor pulmonale w/ mild pulmHTN & 2DEcho 09/2015 showing norm LVF w/ EF=55-60%, norm  wall motion, mild MR, mild RA dil, PAsys est 44mHg...    Medical issues> PCP= Dr. DLauretta GrillKoirala ([facility])>  HBP (on CardizemCD240), HL (on Cres10), hx thyroid nodule, GERD & prob LPR (GI= DrSchooler, on Prilosec40), Constipation, BPH (on Proscar5 & Flomax0.4) w/ hx urinary retention, insomnia (on Restoril30)...       NOTE> pt is Iron deficient & needs to f/u w/ his PCP & GI for this>> LABS 11/25/16>  Chems- wnl x BS=108;  A1c=6.1;  CBC- Hg=12.3, mcv=90, WBC=7.8K;  Fe=31 (6.6%sat) & Ferritin=8.7;   B12=661 EXAM shows Afeb, VSS, O2sat=90% on 2L/min pulse dose;  Heent- neg, mallampati1;  Chest- decr BS at bases, can't augment BS voluntarily, w/o w/r/r;  Heart- RR w/o m/r/g;  Abd- soft, neg;  Ext- neg w/o c/c/e;  Neuro- intact...  ABG on RA> pending (not done)  Home Sleep Test> pending (see below) IMP/PLAN>>  Andrew [name] has agreed to proceed w/ home sleep test to r/o OSA and we will further assess w/ ABG on RA to check for CO2 retention and any indication for BiPAP (doubt he will qualify at this time);  He will continue w/ his O2, Advair250, Spiriva, Duoneb vs ProventilHFA which he uses prn;  He does not appear to need the Revatio either but declines to discontinue this medication for other reasons.  ADDENDUM>>  Home Sleep Test done 05/20/17>  4H study showed mild OSA w/ AHI=6.0/hr;  Study done on RA-- lowest O2sat was 74% w/ an ave of 87%;  Pt given the option for in-lab CPAP titration study vs trial CPAP w/ autoset & a mask fit session w/ VLynnae Sandhoff  He prefers the latter & states he doesn't believe that he has OSA => we will arrange a mask fit session, the order a ResMed S10, air/ auto- set 5/15, w/ heated humidity, cliamate controlled tubing, enroll in airview w/ ROV in 6-8wks after starting for face to face and download... NOTE: he had the mask fit session w/ new FFM & O2 bleed-in at 3L/min; he was not willing to try the CPAP on Auto, says he requires BiPAP & therefore will need an in-lab CPAP/BiPAP titration test...  ~  July 31, 2017:  3676moOV & Andrew [name] had a Home Sleep Test 05/20/17- as above w/ AHI=6/hr & O2desat to 74% w/ ave 87%;  As noted he has been on BiPAP x yrs [place] to obtain any old records relating to his prev Dx of OSA & his need for BiPAP;  It is indeed unfortunate that he did not travel w/ [place] old [place] that he was INTOL to CPAP when tried  in the past;  The prob is that his old machine is wearing out & he is in need of a new machine- he is unable to afford one on his own;  I explained Medicare's rules for CPAP/ BiPAP & how he must follow their edicts about the repeat eval, and eventually the requirement for a download to prove compliance & efficacy-- in essence this means that he MUST have an in lab CPAP/BiPAP titration test, prove INTOL to CPAP to get the BiPAP then determine the BiPAP settings best for him;  This process is followed by a face to face visit in ~76m15mor download purposes...    NOTE: his PCP is DrKoirala ([employer], multimedia his GI is [facility]);  Pt saw ENT- Nordbladh,PA at [facility] on 06/24/17> c/o worsening dysphagia & nasal congestion, long hx GERD on PPI therapy, larynx exam showed polypoid changes of the cords, prom cricopharyngeus, and postglottic edema; prev EGD w/ smallHH, mod GERD; repeat laryngoscopy was essent neg- they ordered a Ba swallow, done 06/30/17 & showed sl retention in the valleculae that cleared w/ repeat swallowing, 67m barium tablet got lodged in the distal esoph & did not pass into the stomach; He again needs attention from his gastroenterologist for dilatation...  EXAM shows Afeb, VSS, O2sat=90% on 2L/min pulse dose;  Heent- neg, mallampati1;  Chest- decr BS at bases, can't augment BS voluntarily, w/o w/r/r;  Heart- RR w/o m/r/g;  Abd- soft, neg;  Ext- neg w/o c/c/e;  Neuro- intact... IMP/PLAN>>  As explained above we will order an in-lab CPAP/BiPAP titration study, and proceed from there...  ADDENDUM>>  Sleep Lab Titration study 10.18.18>  Optimal pressure= 7cmH2O w/ AHI at optimal pressure= 0/hr;  The study lasted 6.5H, no cardiac arrhythmias, no leg movements, he had O2desat lasting longer than 585m despite control in his sleep apnea w/ CPAP and 1L/min O2 was added to his CPAP;  He uses a small [employer] full face mask & heated humidification...  ~  September 22, 2017:  34m31moV & post-  hosp check>  [name] w/ 4d hx incr cough, green sput, temp to 102 & incr SOB; ER eval revealed right basilar pneumonia, WBC=14.2, & O2sat=87% on 2L/min by [place];  He was treated w/ Zithromax & Rocephin, Solumedrol, Nebs, etc;  Cultures and serologies were all non-revealing & he was disch on Keflex, Pred, Nebs (Xopenex & Ipratripium), etc;  He also had PAF which converted spont & he was seen by [facility] and disch on Eliquis, Cardizem, Metoprolol... [facility]  We reviewed the following medical problems during today's office visit>    Severe COPD- GOLD Stage3, bullous emphysema, s/p resection of RUL bleb in 2007> on Advair250Bid & Spiriva daily; he has NEB w/  Xopenex & Atrovent for prn use; he is off PRED now; Spirometry 09/2015 w/ FEV1=1.12 (37%); he finished his PulmRehab 07/2016 & now exercises regularly at the gym => we re-started Pred 74m33mpering sched down to 1/2 Qod by return visit...    Chronic hypoxemic respiratory failure on O2 at 2L/min via POC> ambulatory O2sats 09/2015 dropped to 89% on 2L/min after 2 Laps; repeat 11/2016 dropped to 85% after 1Lap... He uses O2 at 1L/min Qhs and 2L/min w/ activ.    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    Hx cor pulmonale/ pulmonary hypertension on Revatio20Tid since 2007 w/o additional med trials or dose adjustments> we do not have any of the objective data from his prev physician teams; 2DEcho here 09/2015 showed PAsys=36 and we tried to wean his Revatio but he refused due to "other reasons", finally compromised on Revatio20Bid...    Hx CAP- Hosp 2/17 & 10/18 w/  RLL opac (nos) & responded to broad spectrum Ab coverage + Solumedrol, O2, Nebs, etc; readmitted 01/2016 w/ COPD exac- similar Rx but  sent to NH for rehab at [facility]...    Cardiac issues> ?nonobstructive CAD, cor pulmonale w/ mild pulmHTN & 2DEcho 09/2015 showing norm LVF w/ EF=55-60%, norm wall motion, mild MR, mild RA dil, PAsys est 51mHg...episode PAP 10/18 & converted spont- disch from hosp on Eliquis, Metoprolol, Cardizem & f/u w/ [facility]-DrRandolph pending...    Medical issues> PCP= Dr. DLauretta GrillKoirala ([facility])>  HBP (on CardizemCD240), HL (on Cres10), hx thyroid nodule, GERD & prob LPR (GI= DrSchooler, on Prilosec40), Constipation, BPH (on Proscar5 & Flomax0.4) w/ hx urinary retention, anemia & insomnia (on Restoril30)... EXAM shows Afeb, VSS, O2sat=90% on 2L/min pulse dose;  Heent- neg, mallampati1;  Chest- decr BS at bases, can't augment BS voluntarily, w/o w/r/r;  Heart- RR w/o m/r/g;  Abd- soft, neg;  Ext- neg w/o c/c/e;  Neuro- intact...  CXR 09/09/17>  Norm heart size, interval patchy opac & effusion at right base, underlying COPD...  2DEcho 09/10/17>  Norm LV size & function w/ EF=55-60%, no regional wall motion abn, valves are OK, PAsys=357mg  Mod barium swallow 09/10/17> mild pharyngeal dysphagia due to known esoph issues, mild asp risk, swallowing strategies outlined to pt...  LABS in Epic 08/2017>  Chems- ok BS~130, Cr=0.81, Alb=2.7, sl elev LFTs noted;  CBC- anemic w/ Hg=10.9;  TSH=0.52...   CXR 09/22/17 (independently reviewed by me in the PACS system) showed norm heart size, underlying COPD/emphysema, near complete clearing of the right basilar infiltrate... IMP/PLAN>>  We decided to continue his low dose Pred rx w/ 45m50mod;  Continue other meds as prev including NEBs w/ Duoneb Tid followed by Advair250Bid & Spiriva once daily, Mucinex 1-2Bid, Tessalon tid as needed... We plan rov recheck in 6 wks...  ~  November 04, 2017:  [place] and approaching his  baseline- still w/ SOB/DOE but ADLs improved, cough/ phlegm stable, no CP/ edema; on Duoneb tid, Advair250Bid & spiriva daily; on Mucinex, MMW, Tessalon as needed; Pred weaned to 1/2 Qod;  ?hold-up from [place] his new ResMed S10 CPAP set up as above...     He saw [facility]-DrRandolph 09/22/17>  HBP, PAF, HL;  On Eliquis, Metoprolol, Cardizem; Thyroid wnl & Echo w/ EF=55-60%, PAsys=33; felt to be stable- no changes made...     We reviewed his pulm issues/ medical problems during today's office visit-- see above... we do not have notes/ data from [facility]... EXAM shows Afeb, VSS, O2sat=96% on 2L/min pulse dose;  Heent- neg, mallampati1;  Chest- decr BS at bases, can't augment BS voluntarily, w/o w/r/r;  Heart- RR w/o m/r/g;  Abd- soft, neg;  Ext- neg w/o c/c/e;  Neuro- intact... IMP/PLAN>>  We discussed slowly weaning the Pred down to 45mg86md & continue this til ROV in 50mo;14mo will watch BP/ pulse state w/ an eye towards weaning the BBlocker if able;  His appetite is improved, wt up 7#, and back in the gym daily!  ~  December 16, 2017:  6wk ROV & add-on appt requested for cough, yellow sput, dyspnea>  Andrew [name] weaned the Pred to 45mg Q12m& then noticed grad increase in symptoms- cough w/ yellow sput, incr SOB w/ min exertion, etc;  He had ret to the gym & was doing satis until this deterioration;  He uses his CPAP Qhs- rests well, wakes feeling rested, denies daytime hypersomnolence but still naps 2-3/7, states driving is ok w/o drowsiness etc...     Severe COPD/ emphysema w/ bullous dis & prev RUL bleb  resected 2007> on Advair115 & Spiriva daily + Xopenex&Atrovent via NEB prn, and Pred5Qod...    Chr hypoxemic resp failure on O2 at 2L/min via POC...    OSA- on CPAP7 & 1L/min O2 bleed-in.Andrew [name] KitchenMarland Kitchen Download 12/30-1/28/19 showed good compliance (30/30d, ave 7+H per night, on CPAP7 w/ min intermit leak, & good efficacy w/ AHI=1.4.Andrew [name] KitchenMarland Kitchen     Hx cor pulmonale/ pulmonary hypertension on Revatio20Tid since  2007 w/o additional med trials or dose adjustments=> we cut him to bid w/o any untoward effect but he refused to wean further... EXAM shows Afeb, VSS, O2sat=90% on 2L/min pulse dose;  Heent- neg, mallampati1;  Chest- decr BS at bases, can't augment BS voluntarily, w/o w/r/r;  Heart- RR w/o m/r/g;  Abd- soft, neg;  Ext- neg w/o c/c/e;  Neuro- intact... IMP/PLAN>>  We decided to treat his COPD exac w/ DOXY 166m Bid, and an incr Pred 531mtabs to 2tabs Qam for 1wk, then taper to 2-1 Qod, then 2-0 Qod til return;  Asked to continue NEBS Tid followed by Advair-Bid & Spiriva-Qd, + Mucinex600Bid, etc...   ~  February 02, 2018:  6wk ROV & post hospital follow up>  Andrew [name] was HOSP 3/2 - 01/21/18 by [facility] w/ a COPD exacerbation- presented w/ productive cough, wheezing, incr SOB, feeling bad w/ low grade fever; he was on Pred1054md, NEBS w/ Duoneb Tid, Advair250Bid, SpirivaQd, Mucinex, Fluids, etc=> in Hosp eval revealed no acute CXR changes, Hg=11.2=>9.0, WBC=12.5, Chems- ok, cultures all neg (Flu neg, Legionella neg, Pneumococcal neg); he had been exposed to Flu & covered w/ Tamiflu rx; given Rocephin/ Zithromax/ IV Solumedrol=> disch on Pred taper...  We reviewed the following medical problems during today's office visit>    Severe COPD- GOLD Stage3, bullous emphysema, s/p resection of RUL bleb in 2007> on Pred 75m47m Advair250Bid & Spiriva daily; he has NEB w/ Duoneb to use Tid; Spirometry 09/2015 w/ FEV1=1.12 (37%); he finished his PulmRehab 07/2016 & now exercises regularly at the gym =>     Chronic hypoxemic respiratory failure on O2 at 2L/min via POC> ambulatory O2sats dropped on RA & he uses O2 at 1L/min Qhs and 2L/min w/ activ...    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     Hx cor pulmonale/ pulmonary hypertension on Revatio20Tid since 2007 w/o additional med trials or dose adjustments> we do not have any of the objective data from his prev physician teams; 2DEcho here 09/2015 showed PAsys=36 and we tried to wean his Revatio but he refused due to "other reasons", finally compromised on Revatio20Bid...    Hx CAP- Hosp 2/17 & 10/18 w/ RLL opac (nos) & responded to broad spectrum Ab coverage + Solumedrol, O2, Nebs, etc; readmitted 01/2016 w/ COPD exac- similar Rx but sent to NH for rehab at disch...    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    Medical  issues> PCP= Dr. Lauretta Grill Koirala ([facility])>  HBP (on CardizemCD240), HL (on Cres10), hx thyroid nodule, GERD & prob LPR (GI= DrSchooler, on Prilosec40), Constipation, BPH (on Proscar5 & Flomax0.4) w/ hx urinary retention, Anemia w/ iron dific(PCP eval in progress) & insomnia (on Restoril30)... NOTE:  He's been anemic and Fe has been low- pt tells me that his PCP DrKoirala has done post-hosp blood work & is taking care of his anemia work-up! EXAM shows Afeb, VSS, O2sat=95% on 2L/min pulse dose;  Heent- neg, mallampati1;  Chest- decr BS at bases, can't augment BS voluntarily, w/o w/r/r;  Heart- RR w/o m/r/g;  Abd- soft, neg;  Ext- neg w/o c/c/e;  Neuro- intact...  EKG 02/02/18>  PVCs and ventric bigeminy => refer to cards for further eval & rx...   CXR 01/17/18>  Norm heart size, hyperinflation/ bullous emphysema (esp RUL), no acute abn...  LABS 01/2018 in Hosp>  Chems- ok x HCO3=20, BS=178, Cr=0.95,  LFTs ok;  CBC- Hg 11.2=>9.0, WBC=10.2;   IMP/PLAN>>  Andrew [name]'s pulse was recorded at 34 on arrival but this was because of his bigeminy- he is asymptomatic (doesn't note skips, irreg, fluttering, dizzy, etc); on Eliquis, Metoprolol, Cardizem and we will refer back to [facility] for further eval & Rx... REC to continue O2, Pred 12m/d + DUONEB, ADVAIR, SPIRIVA, Mucinex, fluids... We plan recheck in 173mo ~  March 02, 2018:  41m53moV & when last seen [id number] Andrew [name] was in bigeminy yet asymptomatic & we referred him to [facility]- he saw HMeng,PA (for [employer] 02/10/18> known hx HBP, PAF, HL, mild OSA on CPAP & borderline PulmHTN (he has been on Revatio for yrs & refuses to wean this med further); he had PAF 10/18 when hosp for pneumonia, disch on Eliquis, Metoprolol, Cardizem; currently in NSR w/ PVCs and bigeminy- they incr his Cardizem to 180mg62m plan Holter monitor w/ f/u by DrRandolph...     Pulm status is stable today> on O2 at 1L/min rest & 2L/min w/ exercise; on Pred 10mg/12mdvair250Bid & Spiriva daily; he has NEB w/ Duoneb to use Tid; on CPAP7 Qhs & doing satis he says...    He has numerous medical issues and his PCP is DrKoirala- concern for his persistent normochromic anemia & pt says there has been no additional evaluation done; we discussed the need for f/u CBC, Fe, B12, SPE/IEP & he requested for us to [place] these needed tests today... EXAM shows Afeb, VSS, O2sat=95% on 2L/min pulse dose;  Heent- neg, mallampati1;  Chest- decr BS at bases, can't augment BS voluntarily, w/o w/r/r;  Heart- RR w/o m/r/g;  Abd- soft, neg;  Ext- neg w/o c/c/e;  Neuro- intact...  LABS 03/02/18>  Chems- wnl w/ BS=103, Cr=0.92, LFTs wnl;  CBC- anemic w/ Hg=10.6, mcv=88;  Fe=27 (6%sat), Ferritin=8.6;  B12=638, Folate>24, SPE/IEP- neg (no monoclonal prot);  Stool is POS for hidden blood... IMP/PLAN>>  Andrew [name]'s eval confirms the Dx of iron defic anemia and he has heme pos stools=> we will start Rx w/ FeSO4 325mg +45mC500 daily & refer  to his GI- DrSchooler for further eval... Hx PAF, +PVCs being evaluated by [facility]- [place] on Eliquis, Metoprolol, Cardizem..  ~  Apr 14, 2018:  6wk ROV & last visit Andrew [name] had ventric bigeminy & was seen by Cards w/ Cardizem incr to 180mg/d;33m was anemic w/ Hg=10.6 w/ Fe=27 (6%sat) and Ferritin=8.6; stools were pos for hidden blood & we started FeSO4 + VitC and referred him to  GI- DrSchooler;  We do not have notes from PCP- DrKoirala or from GI- DrSchooler but pt tells me that he is sched for colonoscopy soon... Pt states that his breathing is about the same- min cough (he feels related to reflux), no sput, no hemoptysis, chr stable DOE but says it takes longer to recover after activity, no CP, no f/c/s, etc...  We reviewed the following available interval Epic notes>      He saw [facility]- DrRandolph on 03/23/18>  Hx HBP, PAF, OSA on CPAP now, hx pulmHTN; on Eliquis, Metoprolol, CardizemCD, Revatio;  SOB/DOE was stable, no edema, no dizziness or syncope; they decreased his Metoprolol to 12.58m Bid...     He had a formal PT eval 04/06/18>  Notes reviewed from [facility]/ neurorehab, goals outlined, he is hoping to get back into full pulm rehab eventually... We reviewed the following medical problems during today's office visit>    Severe COPD- GOLD Stage3, bullous emphysema, s/p resection of RUL bleb in 2007> on Pred 179md, Advair250Bid & Spiriva daily; he has NEB w/ Duoneb to use Tid; Spirometry 09/2015 w/ FEV1=1.12 (37%); he finished his PulmRehab 07/2016 & then [facility] regularly at the [facility] => [facility]/ [facility] & hasn't bounced back as hoped, referred for PT [facility]    Chronic hypoxemic respiratory failure on O2 at 2L/min via POC> ambulatory O2sats dropped on RA & he uses O2 at 1L/min Qhs and 2L/min w/ activ...    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     Hx cor pulmonale/ pulmonary hypertension on Revatio since 2007 w/o additional med trials or dose adjustments> we do not have any of the objective data from his prev physician teams; 2DEcho here 09/2015 showed PAsys=36 and we tried to wean his Revatio but he refused due to "other reasons", finally compromised on Revatio20Bid...    Hx CAP- Hosp 2/17 & 10/18 w/ RLL opac (nos) & responded to broad spectrum Ab coverage + Solumedrol, O2, Nebs, etc; readmitted 01/2016 w/ COPD exac- similar Rx but sent to NH for rehab at [facility]...    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    Medical issues> PCP= Dr. DibLauretta Grillirala ([facility])>  HBP (on CardizemCD240), HL (on Cres10), hx thyroid nodule, GERD & prob LPR (GI= DrSchooler, on Prilosec40), Constipation, BPH (on Proscar5 & Flomax0.4) w/ hx urinary retention, Anemia w/ iron defic and heme pos stools (referred to GI- DrSchooler) & insomnia (on Restoril30)... EXAM shows Afeb, VSS, O2sat=97% on 4L/min pulse dose;  Heent- neg, mallampati1;  Chest- decr BS at bases, can't augment BS voluntarily, w/o w/r/r;  Heart- RR w/o m/r/g;  Abd- soft, neg;  Ext- neg w/o c/c/e;  Neuro- intact...  CPAP Download 4/23 - 04/08/18 showed  excellent compliance 30/30 days, ~7hrs per night, CPAP7 w/ AHI=0.7, min leak & doing satis...  IMP/PLAN>>  Andrew [name]  is just starting PT/ outpt rehab exercises & hoping to improve suffic to qualify for regular Pulm Rehab again;  He is regular w/ his NEBS Tid, Advair250Bid & Spiriva daily, Pred 25m/d and Mucinex 6069mid;  Requests refill Pred10 & Tessalon- ok;  Continue CPAP Qhs as he is doing- download looks good...  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 After the procedures he developed cough, discolored sput, fever, and incr SOB; he [facility] COPD ex[facility], unable to produce sput for cult, BC neg x2, covered w/ Levaqun & Solumedrol=> weaned & improved toward baseline; f/u CXR w/ COPD/E and NAD;  Labs showed WBC=15K, Hg=10.4, Chems- all wnl;  Disch on same med regimen w/ instructions to f/u w/ PCP-DrKoirala & w/ Pulmonary... We reviewed the following medical problems during today's office visit>    Severe COPD- GOLD Stage3, bullous emphysema, s/p resection of RUL bleb in 2007> on Pred 10mg97mAdvair250Bid & Spiriva daily; he has NEB w/ Duoneb to use Tid; Spirometry 09/2015 w/ FEV1=1.12 (37%); he finished his PulmRehab 07/2016 & then exercised regularly at the gym => [facility] w/ COPD exac & hasn't bounced back as hoped, referred for PT in advance of trying to get back in [facility]  Chronic hypoxemic respiratory failure on O2 at 2L/min via POC> ambulatory O2sats dropped on RA & he uses O2 at 1L/min Qhs and 2L/min w/ activ...    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     Hx cor pulmonale/ pulmonary hypertension on Revatio since 2007 w/o additional med trials or dose adjustments> we do not have any of the objective data from his prev physician teams; 2DEcho here 09/2015 showed PAsys=36 and we tried to wean his Revatio but he refused due to "other reasons", finally compromised on Revatio20Bid...    Hx CAP- Hosp 2/17, 10/18, & 6/19 w/ RLL opac (nos) & responded to broad spectrum Ab coverage + Solumedrol, O2, Nebs, etc; he's had several COPD exac- similar Rx but sent to NH for rehab at disch...    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    Medical issues> PCP= Dr. Dibas Lauretta Grillla ([facility])>  HBP (on CardizemCD240), HL (on Cres10), hx thyroid nodule, GERD & prob LPR (GI= DrSchooler, on Prilosec40), Constipation, BPH (on Proscar5 &  Flomax0.4) w/ hx urinary retention, Anemia w/ iron defic and heme pos stools (referred to GI- DrSchooler) & insomnia (on Restoril30)... EXAM shows Afeb, VSS, O2sat=99% on 2L/min pulse dose;  Heent- neg, mallampati1;  Chest- decr BS at bases, can't augment BS voluntarily, w/o w/r/r;  Heart- RR w/o m/r/g;  Abd- soft, neg;  Ext- neg w/o c/c/e;  Neuro- intact...  CXR 05/06/18>  Norm heart size, advanced COPD/ emphysema w/ apical bullae & incr markings bilat, old sutures on right, NAD...  LABS 05/06/18>  Chems- ok w/ K=4.1, BS=105, Cr=0.95, LFTs wnl;  CBC w/ Hg=12.0, mcv=89, WBC=14K;  Fe=100  (28%sat), Ferritin=44... IMP/PLAN>>  Andrew [name] is improved from his recent COPD exac & hosp- keep Pred at 75m/d, plus DUONEB Tid, Advair250Bid & Spiriva once daily; he will continue other meds the same & restart [facility] the [facility]    ~  June 25, 2018:  230moOV & Andrew [name] indicates that he is improved, [facility] and he has contacted [facility] restarting their program- sched to ret 07/06/18 to discuss re-entry into PulmRehab... He still notes SOB/ DOE more than 24m30moo & hoping that all the therapy will help...    Severe COPD- GOLD Stage3, bullous emphysema, s/p resection of RUL bleb in 2007> on Pred 68m524m Advair250Bid & Spiriva daily; he has NEB w/ Duoneb to use Tid; Spirometry 09/2015 w/ FEV1=1.12 (37%); he finished his PulmRehab 07/2016 & then [facility] & hasn't bounced back as hoped, [facility] to [facility]   Chronic hypoxemic respiratory failure on O2 at 2L/min via POC> ambulatory O2sats dropped on RA & he uses O2 at 1L/min Qhs and 2L/min w/ activ...    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     Hx cor pulmonale/ pulmonary hypertension on Revatio since 2007 w/o additional med trials or dose adjustments> we do not have any of the objective data from his prev physician teams; 2DEcho here 09/2015 showed PAsys=36 and we tried to wean his Revatio but he refused due to "other reasons", finally compromised  on Revatio20Bid...    Hx CAP- Hosp 2/17, 10/18, & 6/19 w/ RLL opac (nos) & responded to broad spectrum Ab coverage + Solumedrol, O2, Nebs, etc; he's had several COPD exac- similar Rx but sent to NH for rehab at disch...    Cardiac issues>  ?nonobstructive CAD, cor pulmonale w/ mild pulmHTN & 2DEcho 09/2015 showing norm LVF w/ EF=55-60%, norm wall motion, mild MR, mild RA dil, PAsys est 324mm75m.episodes PAF 10/18 & converted spont- disch from hosp on Eliquis, Metoprolol, Cardizem & f/u w/ [facility]-DrRandolph; then he developed ventric bigeminy=> med doses adjusted... EXAM shows Afeb, VSS, O2sat=99% on 2L/min pulse dose;  Heent- neg, mallampati1;  Chest- decr BS at bases, can't augment BS voluntarily, w/o w/r/r;  Heart- RR w/o m/r/g;  Abd- soft, neg;  Ext- neg w/o c/c/e;  Neuro- intact...Andrew [name] KitchenMarland Kitchen  IMP/PLAN>>  Andrew [name] has stabilized on his current med regimen + rehab efforts;  He is hoping to start back into pulm rehab very soon;  We wrote for a ZPak to keep on hand & he will call prn any problems;  I woud like to recheck pt at least once more before my retirement in 10/2018...     Past Medical History:  Diagnosis Date  . Remote hx Pulm HTN on BiPAP for yrs from [place]; re-eval here 2018 w/ mild OSA & nocturnal desat => placed on new CPAP apparatus w/ O2 bleed-in...    . BPH (benign prostatic hyperplasia)   . Cancer ([place])    skin  . COPD (chronic obstructive pulmonary disease) ([place])   . Dysphagia   . Emphysema lung ([place])   . GERD (gastroesophageal reflux disease)    " Silent reflux"  . Hearing loss    right ear  . HLD (hyperlipidemia)   . Hypertension   . Oxygen dependent    2-3 liters  . Oxygen dependent   . Pneumonia   Medical Hx>  HBP, ?nonobstructive CAD, HL, thyroid nodule, GERD, constipation, BPH, insomnia... Meds include>  CardizemCD240, Crestor10, Nexium20, Miralax, Proscar10, Restoril30...   Past Surgical History:  Procedure Laterality Date  . CARDIAC CATHETERIZATION    . CATARACT EXTRACTION W/  INTRAOCULAR LENS  IMPLANT, BILATERAL    . COLONOSCOPY WITH PROPOFOL N/A 04/20/2018   Procedure: COLONOSCOPY WITH PROPOFOL;  Surgeon: Wilford Corner, MD;  Location: [place];  Service: Endoscopy;  Laterality: N/A;  . ESOPHAGOGASTRODUODENOSCOPY (EGD) WITH PROPOFOL N/A 08/23/2016   Procedure: ESOPHAGOGASTRODUODENOSCOPY (EGD) WITH PROPOFOL;  Surgeon: Wilford Corner, MD;  Location[facility] ENDOSCOPY;  Service: Endoscopy;  Laterality: N/A;  . ESOPHAGOGASTRODUODENOSCOPY (EGD) WITH PROPOFOL N/A 04/20/2018   Procedure: ESOPHAGOGASTRODUODENOSCOPY (EGD) WITH PROPOFOL;  Surgeon: Wilford Corner, MD;  Location: [place];  Service: Endoscopy;  Laterality: N/A;  . HOT HEMOSTASIS N/A 04/20/2018   Procedure: HOT HEMOSTASIS (ARGON PLASMA COAGULATION/BICAP);  Surgeon: Wilford Corner, MD;  Location: [place];  Service: Endoscopy;  Laterality: N/A;  . LUNG SURGERY    . POLYPECTOMY  04/20/2018   Procedure: POLYPECTOMY;  Surgeon: Wilford Corner, MD;  Location: [facility] ENDOSCOPY;  Service: Endoscopy;;  . TONSILLECTOMY    Hx Thoracotomy & RUL Bleb-ectomy 2007 in [place], [place]...   Outpatient Encounter Medications as of 06/25/2018  Medication Sig  . ADVAIR DISKUS 250-50 MCG/DOSE AEPB Inhale 1 puff into the lungs 2 (two) times daily.  Andrew [name] Kitchen albuterol (PROVENTIL HFA;VENTOLIN HFA) 108 (90 Base) MCG/ACT inhaler Inhale 2 puffs into the lungs every 6 (six) hours as needed for wheezing or shortness of breath.  Andrew [name] Kitchen apixaban (ELIQUIS) 5 MG TABS tablet Take 1 tablet (5 mg total) by mouth 2 (two) times daily.  Andrew [name] Kitchen azithromycin (ZITHROMAX) 250 MG tablet Take as directed  . benzonatate (TESSALON) 100 MG capsule TAKE 1 CAPSULE THREE TIMES DAILY AS NEEDED FOR COUGH.  . diltiazem (CARDIZEM CD) 180 MG 24 hr capsule Take 1 capsule (180 mg total) by mouth daily.  . finasteride (PROSCAR) 5 MG tablet Take 5 mg by mouth at bedtime.   . fluticasone (FLONASE) 50 MCG/ACT nasal spray Place 1 spray into both nostrils at bedtime.   Andrew [name] Kitchen guaiFENesin  (MUCINEX) 600 MG 12 hr tablet Take 1 tablet (600 mg total) by mouth 2 (two) times daily.  . hydrocortisone cream 1 % Apply 1 application topically daily as needed (Eczema on face and head).  Andrew [name] Kitchen ipratropium-albuterol (DUONEB) 0.5-2.5 (3) MG/3ML SOLN Take 3 mLs by nebulization  3 (three) times daily.  Andrew [name] Kitchen liver oil-zinc oxide (DESITIN) 40 % ointment Apply 1 application topically daily.  . metoprolol tartrate (LOPRESSOR) 25 MG tablet Take 0.5 tablets (12.5 mg total) by mouth 2 (two) times daily.  . Multiple Vitamins-Minerals (MULTIVITAMIN WITH MINERALS) tablet Take 1 tablet by mouth daily.  Andrew [name] Kitchen omeprazole (PRILOSEC) 40 MG capsule Take 40 mg by mouth 2 (two) times daily.   . OXYGEN Inhale 2-3 L into the lungs See admin instructions. 2L daytime, 3L at night   . polyethylene glycol (MIRALAX / GLYCOLAX) packet Take 17 g by mouth daily. (Patient taking differently: Take 17 g by mouth at bedtime. Mix in 8 oz liquid and drink)  . predniSONE (DELTASONE) 10 MG tablet Take 4 tabs daily for 3 days, then 3 tabs daily for 3 days, then 2 tabs daily for 3 days, then continue 1 tab daily maintenance as you were doing before.  . rosuvastatin (CRESTOR) 10 MG tablet Take 10 mg by mouth daily.  . sildenafil (REVATIO) 20 MG tablet Take 1 tablet (20 mg total) by mouth 2 (two) times daily.  Andrew [name] Kitchen SPIRIVA HANDIHALER 18 MCG inhalation capsule INHALE THE CONTENTS OF 1 CAPSULE EVERY DAY  . tamsulosin (FLOMAX) 0.4 MG CAPS capsule Take 1 capsule (0.4 mg total) by mouth daily after breakfast.  . temazepam (RESTORIL) 30 MG capsule TAKE ONE CAPSULE BY MOUTH EVERY NIGHT AT BEDTIME  . vitamin C (ASCORBIC ACID) 500 MG tablet Take 500 mg by mouth 2 (two) times daily.    No facility-administered encounter medications on file as of 06/25/2018.     No Known Allergies   Immunization History  Administered Date(s) Administered  . Influenza, High Dose Seasonal PF 07/22/2016, 08/18/2017  . Influenza-Unspecified 09/19/2015  . PPD Test 02/15/2016    . Pneumococcal Conjugate-13 09/19/2015  . Pneumococcal Polysaccharide-23 12/20/2016    Current Medications, Allergies, Past Medical History, Past Surgical History, Family History, and Social History were reviewed in [employer] record.   Review of Systems             All symptoms NEG except where BOLDED >>  Constitutional:  F/C/S, fatigue, anorexia, unexpected weight change. HEENT:  HA, visual changes, hearing loss, earache, nasal symptoms, sore throat, mouth sores, hoarseness. Resp:  cough, sputum, hemoptysis; SOB, tightness, wheezing. Cardio:  CP, palpit, DOE, orthopnea, edema. GI:  N/V/D/C, blood in stool; reflux, abd pain, distention, gas. GU:  dysuria, freq, urgency, hematuria, flank pain, voiding difficulty. MS:  joint pain, swelling, tenderness, decr ROM; neck pain, back pain, etc. Neuro:  HA, tremors, seizures, dizziness, syncope, weakness, numbness, gait abn. Skin:  suspicious lesions or skin rash. Heme:  adenopathy, bruising, bleeding. Psyche:  confusion, agitation, sleep disturbance, hallucinations, anxiety, depression suicidal.   Objective:   Physical Exam       Vital Signs:  Reviewed...   General:  WD, WN, 77 y/o WM in NAD; alert & oriented; pleasant & cooperative... HEENT:  [place]/AT; Conjunctiva- pink, Sclera- nonicteric, EOM-wnl, PERRLA, EACs-clear, TMs-wnl; NOSE-clear; THROAT-clear & wnl.  Neck:  Supple w/ fair ROM; no JVD; normal carotid impulses w/o bruits; no thyromegaly +small nodule palpated; no lymphadenopathy.  Chest:  Overinflated, resonant percussion note, decr BS bilat & can't augment BS voluntarily, no w/r/r heard... Heart:  Regular Rhythm; norm S1 & S2 without murmurs, rubs, or gallops detected. Abdomen:  Soft & nontender- no guarding or rebound; normal bowel sounds; no organomegaly or masses palpated. Ext:  decr ROM; without deformities or arthritic changes; no varicose  veins, +venous insuffic, no edema;  Pulses intact w/o  bruits. Neuro:  No focal neuro deficits; sensory testing normal; gait normal & balance OK. Derm:  No lesions noted; no rash etc. Lymph:  No cervical, supraclavicular, axillary, or inguinal adenopathy palpated.   Assessment:      IMP >>     Severe COPD- GOLD Stage3, bullous emphysema, s/p resection of RUL bleb in 2007> on Advair250Bid & Spiriva daily; he has NEB w/ Duoneb for prn use; Spirometry 09/2015 w/ FEV1=1.12 (37%); he has completed pulm rehab & now exercises at the gym 5d/wk;  He's had mult COPD exac => treat w/ Levaquin, Pred taper, continue Advair/ spiriva/ NEBS/ O2...    Chronic hypoxemic respiratory failure on O2 at 2L/min via POC> ambulatory O2sats 09/2015 dropped to 89% on 2L/min after 2 Laps; he dropped to 85% after 1Lap on 3L/min POC 11/2016...    Hx prob hypercarbic resp failure inferred from Pt report of BiPAP ([facility]) since 2007, never been on CPAP, never had sleep study, unknown ABGs or pCO2 data> we never received data from his prev physicians.    Hx cor pulmonale/ pulmonary hypertension on Revatio20Tid since 2007 w/o additional med trials or dose adjustments> we do not have any of the objective data from his prev physician teams; 2DEcho here 09/2015 showed PAsys=36 and we tried to wean his Revatio but he refused due to "other reasons", finally compromised on Revatio20Bid...    Hx CAP- Hosp 12/2015 w/ RLL opac (nos) & responded to broad spectrum Ab coverage + Sulomedrol, O2, Nebs, etc; readmitted 01/2016 w/ COPD exac- similar Rx but sent to NH for rehab at disch...    Hx of being on Home BiPAP since 2007 w/ eval & set up in [place]> we have been unable to obtain any old records regarding his initial studies and BiPAP set up...    Cardiac issues>  ?nonobstructive CAD, cor pulmonale w/ mild pulmHTN & 2DEcho 09/2015 showing norm LVF w/ EF=55-60%, norm wall motion, mild MR, mild RA dil, PAsys est 65mHg...    Medical issues include:  HBP (on CardizemCD240), HL (on Cres10), hx thyroid  nodule, GERD (on Nexium40), constipation, BPH (on Proscar5 & Flomax0.4) w/ hx urinary retention, insomnia (on Restoril30)...       NOTE>  He is Iron deficient & need further eval...  PLAN >>  10/02/15>   MKevorkhas a distinctly patchy history to go along w/ his severe airflow obstruction & bullous emphysema;  We really need his old objective data from 2007 when he was started on O2 & BiPAP after his RUL bleb reduction surg;  He will try to get names and numbers for [place] in the meanwhile [place] [facility] [place] as [place]  He is very concerned that he wants u[place]ato continue his current regimen which has served him well over the last 974yr  Continue Advair250Bid, Spiriva daily, Revatio20tid, O2 at 2L/min, and the BiPAP nightly as currently set...  11/01/15>  See prob list above- he is stable on this regimen but does not want to wean the Revatio further for "other" reasons; OK to continue current med regimen- advised regular exercise vs pulm rehab program; given ZPak for prn use over the winter & he knows to call for any resp issues, incr dyspnea, etc... He remains on BiPAP but we do not have any data- no records received from prev pulm physicians, no  notes from [place], no download data from his machine- we will again try to make contact w/ his DME company... 03/18/16>   Andrew [name] has had a protracted resp [place] x2 this spring- triggered by prob RLL pneumonia (NOS) superimposed on his severe COPD/bullous emphysema;  Rec to change the Albut for Neb to [place] & continue treatments Tid; continue other meds regularly as outlined;  He is referred to [facility] & hopes to start soon;  We will plan ROV in [place] if needed. 06/20/16>   Andrew [name] is stable on his baseline regimen + pulm rehab, etc; rec to continue current meds and exercise; he needs the 2017 Flu vaccine when avail & will call prn any breathing problems; we plan ROV  in 669mo.  11/25/16>   There is no such thing as a mild exac for MrBowman- even the mildest of URIs can cause a severe COPD exac- we discussed Rx w/ Levaquin500 x7d, Pred2016m5d taper (see AVS), + Diflucan100 per his request; hopefully he will respond but he has little in the way of reversible factors given his severe emphysema; reminded [facility] if symptoms worsen despite therapy; as noted he is Fe defic & needs further eval per his PCP & GI- copies sent. => subseq Hosp for 4d w/ IV Solumedrol, NEBs/ O2/ etc... 05/01/17>   Andrew [name] has agreed to proceed w/ home sleep test to r/o OSA and we will further assess w/ ABG on RA to check for CO2 retention and any indication for BiPAP (doubt he will qualify at this time);  He will continue w/ his O2, Advair250, Spiriva, Duoneb vs ProventilHFA which he uses prn;  He does not appear to need the Revatio either but declines to discontinue this medication for other reasons; 07/31/17>   As explained above we will order an in-lab CPAP/BiPAP titration study, and proceed from there. 08/2017>   He was Hosp [facility] w/ RLL pneumonia (nos) and ac on chr hypoxemic resp failure; disch on NEBS, Advair, Spiriva, Pred + Eliquis, Cardizem, Metoprolol for his PAF episode...  09/22/17>   We decided to continue his low dose Pred rx w/ 5mg77md;  Continue other meds as prev including NEBs w/ Duoneb Tid followed by Advair250Bid & Spiriva once daily, Mucinex 1-2Bid, Tessalon tid as needed... We plan rov recheck in 6 wks. 11/04/17>   We discussed slowly weaning the Pred down to 5mg 46m & continue this til ROV in 66mo; 5mowill watch BP/ pulse state w/ an eye towards weaning the BBlocker if able;  His appetite is improved, wt up 7#, and back in the gym daily! 12/16/17>   We decided to treat his COPD exac w/ DOXY 100mg B34mand an incr Pred 5mg tab16mo 2tabs Qam for 1wk, then taper to 2-1 Qod, then 2-0 Qod til return;  Asked to continue NEBS Tid followed by Advair-Bid &  Spiriva-Qd, + Mucinex600Bid, etc. 02/02/18>   Andrew [name]'s pulse was recorded at 34 on arrival but this was because of his bigeminy- he is asymptomatic (doesn't note skips, irreg, fluttering, dizzy, etc); on Eliquis, Metoprolol, Cardizem and we will refer back to [facility] for further eval & Rx... REC to continue O2, Pred 10mg/d +30mNEB, ADVAIR, SPIRIVA, Mucinex, fluids... We plan recheck in 53mo 4/15/95mo  Andrew [name]'s eval confirms the Dx of iron defic anemia and he has heme pos stools=> we will start Rx w/ FeSO4 325mg + Vit38m daily & refer to his  GI- DrSchooler for further eval... Hx PAF, +PVCs being evaluated by CARDSIvar Drape & he remains on Eliquis, Metoprolol, Cardizem 04/14/18>   Andrew [name] is just starting PT/ outpt rehab exercises & hoping to improve suffic to qualify for regular Pulm Rehab again;  He is regular w/ his NEBS Tid, Advair250Bid & Spiriva daily, Pred 38m/d and Mucinex 6058mid;  Requests refill Pred10 & Tessalon- ok;  Continue CPAP Qhs as he is doing- download looks good...  We plan ROV recheck in 37m45mo/19/19>   Andrew [name] is improved from his recent COPD exac & hosp- keep Pred at 53m74m plus DUONEB Tid, Advair250Bid & Spiriva once daily; he will continue other meds the same & restart his PT w/ the [facility] 06/25/[facility]>   Andrew [name] has stabilized on his current med regimen + rehab efforts;  He is hoping to start back into pulm rehab very soon;  We wrote for a ZPak to keep on hand & he will call prn any problems;  I woud like to recheck pt at least once more before my retirement in 10/2018...   Plan:     Patient's Medications  New Prescriptions   AZITHROMYCIN (ZITHROMAX) 250 MG TABLET    Take as directed  Previous Medications   ADVAIR DISKUS 250-50 MCG/DOSE AEPB    Inhale 1 puff into the lungs 2 (two) times daily.   ALBUTEROL (PROVENTIL HFA;VENTOLIN HFA) 108 (90 BASE) MCG/ACT INHALER    Inhale 2 puffs into the lungs every 6 (six) hours as needed for wheezing or shortness of  breath.   APIXABAN (ELIQUIS) 5 MG TABS TABLET    Take 1 tablet (5 mg total) by mouth 2 (two) times daily.   BENZONATATE (TESSALON) 100 MG CAPSULE    TAKE 1 CAPSULE THREE TIMES DAILY AS NEEDED FOR COUGH.   DILTIAZEM (CARDIZEM CD) 180 MG 24 HR CAPSULE    Take 1 capsule (180 mg total) by mouth daily.   FINASTERIDE (PROSCAR) 5 MG TABLET    Take 5 mg by mouth at bedtime.    FLUTICASONE (FLONASE) 50 MCG/ACT NASAL SPRAY    Place 1 spray into both nostrils at bedtime.    GUAIFENESIN (MUCINEX) 600 MG 12 HR TABLET    Take 1 tablet (600 mg total) by mouth 2 (two) times daily.   HYDROCORTISONE CREAM 1 %    Apply 1 application topically daily as needed (Eczema on face and head).   IPRATROPIUM-ALBUTEROL (DUONEB) 0.5-2.5 (3) MG/3ML SOLN    Take 3 mLs by nebulization 3 (three) times daily.   LIVER OIL-ZINC OXIDE (DESITIN) 40 % OINTMENT    Apply 1 application topically daily.   METOPROLOL TARTRATE (LOPRESSOR) 25 MG TABLET    Take 0.5 tablets (12.5 mg total) by mouth 2 (two) times daily.   MULTIPLE VITAMINS-MINERALS (MULTIVITAMIN WITH MINERALS) TABLET    Take 1 tablet by mouth daily.   OMEPRAZOLE (PRILOSEC) 40 MG CAPSULE    Take 40 mg by mouth 2 (two) times daily.    OXYGEN    Inhale 2-3 L into the lungs See admin instructions. 2L daytime, 3L at night    POLYETHYLENE GLYCOL (MIRALAX / GLYCOLAX) PACKET    Take 17 g by mouth daily.   PREDNISONE (DELTASONE) 10 MG TABLET    Take 4 tabs daily for 3 days, then 3 tabs daily for 3 days, then 2 tabs daily for 3 days, then continue 1 tab daily maintenance as you were doing before.   ROSUVASTATIN (CRESTOR) 10  MG TABLET    Take 10 mg by mouth daily.   SILDENAFIL (REVATIO) 20 MG TABLET    Take 1 tablet (20 mg total) by mouth 2 (two) times daily.   SPIRIVA HANDIHALER 18 MCG INHALATION CAPSULE    INHALE THE CONTENTS OF 1 CAPSULE EVERY DAY   TAMSULOSIN (FLOMAX) 0.4 MG CAPS CAPSULE    Take 1 capsule (0.4 mg total) by mouth daily after breakfast.   TEMAZEPAM (RESTORIL) 30 MG  CAPSULE    TAKE ONE CAPSULE BY MOUTH EVERY NIGHT AT BEDTIME   VITAMIN C (ASCORBIC ACID) 500 MG TABLET    Take 500 mg by mouth 2 (two) times daily.   Modified Medications   No medications on file  Discontinued Medications   No medications on file

## 2018-06-26 ENCOUNTER — Ambulatory Visit: Payer: Medicare Other | Admitting: Physical Therapy

## 2018-06-26 ENCOUNTER — Encounter: Payer: Self-pay | Admitting: Physical Therapy

## 2018-06-26 DIAGNOSIS — R2681 Unsteadiness on feet: Secondary | ICD-10-CM | POA: Diagnosis not present

## 2018-06-26 DIAGNOSIS — R2689 Other abnormalities of gait and mobility: Secondary | ICD-10-CM

## 2018-06-26 DIAGNOSIS — M6281 Muscle weakness (generalized): Secondary | ICD-10-CM

## 2018-06-26 NOTE — Therapy (Signed)
[facility] [address] [place], [place], [address] Phone: [phone]   Fax:  [phone]  Physical Therapy Treatment  Patient Details  Name: Andrew [name] MRN: [id number] Date of Birth: 05-Jun-1941 Referring Provider: Dr. Teressa Lower   10th visit progress note covering dates of service 04/06/18 to 06/26/2018 (pt had a hospitalization during this time).    Encounter Date: 06/26/2018  PT End of Session - 06/26/18 1215    Visit Number  10    Number of Visits  25    Date for PT Re-Evaluation  07/05/18    Authorization Type  Medicare    Authorization Time Period  2-33-00 - 7-62-; recertification 2/63/33 to 08/09/2018    PT Start Time  1148    PT Stop Time  1230    PT Time Calculation (min)  42 min    Equipment Utilized During Treatment  Oxygen    Activity Tolerance  Patient tolerated treatment well    Behavior During Therapy  [facility] for tasks assessed/performed       Past Medical History:  Diagnosis Date  . Anemia   . BiPAP (biphasic positive airway pressure) dependence    Pt denies history of OSA  . BPH (benign prostatic hyperplasia)   . Cancer ([place])    skin  . COPD (chronic obstructive pulmonary disease) ([place])   . Dysphagia   . Emphysema lung ([place])   . GERD (gastroesophageal reflux disease)    " Silent reflux"  . Hearing loss    right ear  . History of hiatal hernia   . HLD (hyperlipidemia)   . Hypertension   . Oxygen dependent    2-3 liters  . Oxygen dependent   . Pneumonia   . Pulmonary hypertension ([place])   . Sleep apnea    wears cpap    Past Surgical History:  Procedure Laterality Date  . CARDIAC CATHETERIZATION    . CATARACT EXTRACTION W/ INTRAOCULAR LENS  IMPLANT, BILATERAL    . COLONOSCOPY WITH PROPOFOL N/A 04/20/2018   Procedure: COLONOSCOPY WITH PROPOFOL;  Surgeon: Wilford Corner, MD;  Location: [facility];  Service: Endoscopy;  Laterality: N/A;  . ESOPHAGOGASTRODUODENOSCOPY (EGD) WITH  PROPOFOL N/A 08/23/2016   Procedure: ESOPHAGOGASTRODUODENOSCOPY (EGD) WITH PROPOFOL;  Surgeon: Wilford Corner, MD;  [facility]COPY;  Service: Endoscopy;  Laterality: N/A;  . ESOPHAGOGASTRODUODENOSCOPY (EGD) WITH PROPOFOL N/A 04/20/2018   Procedure: ESOPHAGOGASTRODUODENOSCOPY (EGD) WITH PROPOFOL;  Surgeon: Wilford Corner, MD;  Location: [facility];  Service: Endoscopy;  Laterality: N/A;  . HOT HEMOSTASIS N/A 04/20/2018   Procedure: HOT HEMOSTASIS (ARGON PLASMA COAGULATION/BICAP);  Surgeon: Wilford Corner, MD;  Location: [facility];  Service: Endoscopy;  Laterality: N/A;  . LUNG SURGERY    . POLYPECTOMY  04/20/2018   Procedure: POLYPECTOMY;  Surgeon: Wilford Corner, MD;  Location: [facility] ENDOSCOPY;  Service: Endoscopy;;  . TONSILLECTOMY      There were no vitals filed for this visit.  Subjective Assessment - 06/26/18 1150    Subjective  Reports good report with Dr. Lenna Gilford. He made referral to Pulmonary Rehab and they called pt yesterday to begin Aug 19.     Pertinent History  Chronic hypoxemic respiratory failure, pulmonary HTN, centrilobular emphysema, paroxysmal atrial fibrillation:  COPD    Patient Stated Goals  improve strength and endurance to be able to participate in pulmonary rehab and then to return to [facility] for exercise    Currently in Pain?  No/denies          Treatment-  Prior to ambulation SaO2 98% on 3L (pt incr from 2L to 3L prior to initiating session). Walked 4 laps (~480 ft) in 3 minutes. Immediately after SaO2 87-88% and RPE 12. Seated rest with return to 90% in 20 seconds.   10 step up/down onto [employer] step (~4") with SaO2 94-95%, repeated 10 more steps and decr to 91% and required ~1.5 minutes to increase to 94% (remained in standing)                [facility] Adult PT Treatment/Exercise - 06/26/18 1211      Knee/Hip Exercises: Aerobic   Nustep  Level 4.0 with UE/LE's with goal 60-70 steps per minute on 3 lpm oxygen. SaO2 98% to start; after 2 min  95%; after 6 minutes 96% RPE 10; 10 minutes 96% RPE 10; 15 minutes SaO2 96% with RPE 10.              PT Education - 06/26/18 1258    Education Details  Patient inquiring re: workout shoes for "flat feet." Informed of excellent service at [employer] in [place], however their shoes are expensive. Discussed looking for shoes with incr support along the medial aspect of shoe. Recommended he look at [employer].     Person(s) Educated  Patient    Methods  Explanation    Comprehension  Verbalized understanding       PT Short Term Goals - 06/15/18 1151      PT SHORT TERM GOAL #1   Title  Patient will tolerate continuous 15 minutes on Nustep using all 4 extremities with steps/minute >60, SaO2>=88% and RPE <=14 (Target all STGs 06/18/2018-date extended due to 2 week gap in appts due to high census)    Baseline  06/15/18: met today in session with SaO2 >/=91% and RPE11 after 15 minutes with >/= 60 steps per minute    Status  Achieved      PT SHORT TERM GOAL #2   Title  Patient will ambulate 5 consecutive minutes on treadmill with SaO2>=88% and RPE <=14    Baseline  06/15/18: SaO2 91%, RPE= 11,  1.3 mph x 5 mintutes with 1-2 hand hold    Status  Achieved      PT SHORT TERM GOAL #3   Title  Patient will tolerate up/down 4 steps x 2 (total 8 steps) with SaO2>=88% and RPE <=14    Baseline  06/15/18: met today with SaO2 91%, RPE <10    Status  Achieved        PT Long Term Goals - 06/23/18 1807      PT LONG TERM GOAL #1   Title  Pt will amb. 4" nonstop to demo improved activity tolerance.  (Target for LTGs updated to 07/16/18--date extended due to 2 week lapse in appts due to high census)      PT LONG TERM GOAL #2   Title  Pt will perform 5 times sit to stand in </= 8 secs to demo increased strength and endurance.      PT LONG TERM GOAL #3   Title  Pt will negotiate 12 step with 1 rail using a step over step sequence with PRE rating </= 4/10 intensity.      PT LONG TERM GOAL #4    Title  Independent in HEP for strengthening and balance exercises.       PT LONG TERM GOAL #5   Title  Pt will be able to participate in pulmonary rehab upon D/C  from PT.            Plan - 06/26/18 1152    Clinical Impression Statement  Patient feels he is improving his exercise tolerance. Was able to park further away from door at MD's office and (even in the heat) walk in/out of building while "feeling good." He has now been scheduled to begin Pulmonary Rehab 07/06/18 at 1:30 pm. Agreed we will discharge from PT 07/02/18.     Rehab Potential  Good    PT Frequency  2x / week    PT Duration  8 weeks    PT Treatment/Interventions  ADLs/Self Care Home Management;Therapeutic exercise;Therapeutic activities;Gait training;Stair training;Balance training;Neuromuscular re-education;Patient/family education;Energy conservation    PT Next Visit Plan  wrap up on 8/15 and start pulm rehab 8/19; cont endurance and balance activities; goal for 15 minutes of activity (try walking 15 minutes on "track") and 15 minutes on Nustep    PT Home Exercise Plan  gave balance exercises on 04-23-18 - kicks, SLS, sidestepping and marching    Consulted and Agree with Plan of Care  Patient       Patient will benefit from skilled therapeutic intervention in order to improve the following deficits and impairments:  Difficulty walking, Decreased balance, Decreased activity tolerance, Cardiopulmonary status limiting activity, Decreased endurance  Visit Diagnosis: Other abnormalities of gait and mobility  Muscle weakness (generalized)     Problem List Patient Active Problem List   Diagnosis Date Noted  . Iron deficiency anemia, unspecified 04/20/2018  . PVCs (premature ventricular contractions) 02/02/2018  . Pressure injury of skin 01/18/2018  . Influenza-like illness 01/17/2018  . History of pneumonia 12/16/2017  . OSA on CPAP 09/22/2017  . Paroxysmal atrial fibrillation ([facility])   . Malnutrition of moderate  degree 09/10/2017  . Protein-calorie malnutrition, severe 12/09/2016  . Dysphagia 08/23/2016  . Normocytic anemia 06/28/2016  . Pressure sore on buttocks 06/28/2016  . Pulmonary hypertension ([place]) 02/20/2016  . GERD (gastroesophageal reflux disease) 02/20/2016  . COPD with acute exacerbation ([place]) 02/13/2016  . Acute respiratory failure with hypoxia ([place]) 02/12/2016  . Hypertension 02/11/2016  . Hyperlipidemia 02/11/2016  . CAP (community acquired pneumonia)   . BPH (benign prostatic hyperplasia) 12/29/2015  . COPD with emphysema ([place]) 10/02/2015  . Chronic hypoxemic respiratory failure ([place]) 10/02/2015  . History of pulmonary hypertension 10/02/2015    Rexanne Mano, PT 06/26/2018, 1:10 PM  [facility] [address] [place], [place], [address] Phone: [phone]   Fax:  [phone]  Name: Mehdi Gironda MRN: [id number] Date of Birth: 03-10-1941

## 2018-06-29 ENCOUNTER — Ambulatory Visit: Payer: Medicare Other | Admitting: Physical Therapy

## 2018-06-29 ENCOUNTER — Encounter: Payer: Self-pay | Admitting: Physical Therapy

## 2018-06-29 DIAGNOSIS — R2689 Other abnormalities of gait and mobility: Secondary | ICD-10-CM

## 2018-06-29 DIAGNOSIS — R2681 Unsteadiness on feet: Secondary | ICD-10-CM | POA: Diagnosis not present

## 2018-06-29 DIAGNOSIS — M6281 Muscle weakness (generalized): Secondary | ICD-10-CM | POA: Diagnosis not present

## 2018-06-29 NOTE — Therapy (Signed)
[facility] [address] [place], [place], [address] Phone: [phone]   Fax:  [phone]  Physical Therapy Treatment  Patient Details  Name: Andrew [name] MRN: [id number] Date of Birth: [birth year] Referring Provider: Dr. Teressa Lower    Encounter Date: 06/29/2018  PT End of Session - 06/29/18 1357    Visit Number  11    Number of Visits  25    Date for PT Re-Evaluation  07/05/18    Authorization Type  Medicare    Authorization Time Period  6-96-29 - 5-28-; recertification 03/01/23 to 08/09/2018    PT Start Time  1102    PT Stop Time  1148    PT Time Calculation (min)  46 min       Past Medical History:  Diagnosis Date  . Anemia   . BiPAP (biphasic positive airway pressure) dependence    Pt denies history of OSA  . BPH (benign prostatic hyperplasia)   . Cancer ([place])    skin  . COPD (chronic obstructive pulmonary disease) ([place])   . Dysphagia   . Emphysema lung ([place])   . GERD (gastroesophageal reflux disease)    " Silent reflux"  . Hearing loss    right ear  . History of hiatal hernia   . HLD (hyperlipidemia)   . Hypertension   . Oxygen dependent    2-3 liters  . Oxygen dependent   . Pneumonia   . Pulmonary hypertension ([place])   . Sleep apnea    wears cpap    Past Surgical History:  Procedure Laterality Date  . CARDIAC CATHETERIZATION    . CATARACT EXTRACTION W/ INTRAOCULAR LENS  IMPLANT, BILATERAL    . COLONOSCOPY WITH PROPOFOL N/A 04/20/2018   Procedure: COLONOSCOPY WITH PROPOFOL;  Surgeon: Wilford Corner, MD;  Location: [facility];  Service: Endoscopy;  Laterality: N/A;  . ESOPHAGOGASTRODUODENOSCOPY (EGD) WITH PROPOFOL N/A 08/23/2016   Procedure: ESOPHAGOGASTRODUODENOSCOPY (EGD) WITH PROPOFOL;  Surgeon: Wilford Corner, MD;  Location: [facility] ENDOSCOPY;  Service: Endoscopy;  Laterality: N/A;  . ESOPHAGOGASTRODUODENOSCOPY (EGD) WITH PROPOFOL N/A 04/20/2018   Procedure: ESOPHAGOGASTRODUODENOSCOPY (EGD)  WITH PROPOFOL;  Surgeon: Wilford Corner, MD;  Location: [facility];  Service: Endoscopy;  Laterality: N/A;  . HOT HEMOSTASIS N/A 04/20/2018   Procedure: HOT HEMOSTASIS (ARGON PLASMA COAGULATION/BICAP);  Surgeon: Wilford Corner, MD;  Location: [facility];  Service: Endoscopy;  Laterality: N/A;  . LUNG SURGERY    . POLYPECTOMY  04/20/2018   Procedure: POLYPECTOMY;  Surgeon: Wilford Corner, MD;  Location[facility] ENDOSCOPY;  Service: Endoscopy;;  . TONSILLECTOMY      There were no vitals filed for this visit.  Subjective Assessment - 06/29/18 1342    Subjective  Pt states he had a really good day yesterday - went to church, out to lunch and felt good at end of day yesterday; is not feeling quite as good today    Pertinent History  Chronic hypoxemic respiratory failure, pulmonary HTN, centrilobular emphysema, paroxysmal atrial fibrillation:  COPD    Patient Stated Goals  improve strength and endurance to be able to participate in pulmonary rehab and then to return to [facility] for exercise    Currently in Pain?  No/denies          [facility] Adult PT Treatment/Exercise - 06/29/18 1127      Transfers   Transfers  Sit to Stand    Number of Reps  Other reps (comment)   3   Comments  no UE support used  Ambulation/Gait   Ambulation/Gait  Yes    Ambulation/Gait Assistance  5: Supervision    Ambulation/Gait Assistance Details  pt amb. 4" 32 secs without device - was attempting to amb. 5" time period but pt needed to rest after 4" 32 secs:  Sa O2 82% (pt on 3L O2)    Stairs  Yes    Stairs Assistance  5: Supervision    Stairs Assistance Details (indicate cue type and reason)  O2Sa rate 85% after 5 reps step negotiation    Stair Management Technique  Two rails;Alternating pattern    Number of Stairs  4   5 reps    Height of Stairs  6      Knee/Hip Exercises: Aerobic   Nustep  Level 4 with UE's and LE's x 5"      Knee/Hip Exercises: Standing   Hip Flexion  Stengthening;Right;Left;Both;1  set;10 reps;Knee straight   2# wieght used   Forward Step Up  Both;1 set;10 reps;Hand Hold: 1;Step Height: 6"   pt on 2L O2; O2 Sa rate 78%      NeuroRe-ed:  Pt performed single limb stance activities - slow marching 10 reps each leg with bil. UE support with 2# weight on each leg; 10 reps without UE support  Pt performed stepping over and back of orange hurdles - 5 reps each leg with min hand held assist - O2 Sa rate 90% after this activity Pt performed touching 3 balance bubbles - 3 reps with each foot with CGA prn for balance recovery - O2 sat rate 89% after this activity Side stepping 20' with 2# weight on each leg - then added squats to sidestepping 20" with 2# weight on each leg - O2 sat rate 88%  tap ups to 1st step 5 reps each leg alternating with CGA;  Tap ups to 2nd step with UE support 5 reps each leg - O2 sat rate 89% (pt on 3L oxygen)       PT Short Term Goals - 06/15/18 1151      PT SHORT TERM GOAL #1   Title  Patient will tolerate continuous 15 minutes on Nustep using all 4 extremities with steps/minute >60, SaO2>=88% and RPE <=14 (Target all STGs 06/18/2018-date extended due to 2 week gap in appts due to high census)    Baseline  06/15/18: met today in session with SaO2 >/=91% and RPE11 after 15 minutes with >/= 60 steps per minute    Status  Achieved      PT SHORT TERM GOAL #2   Title  Patient will ambulate 5 consecutive minutes on treadmill with SaO2>=88% and RPE <=14    Baseline  06/15/18: SaO2 91%, RPE= 11,  1.3 mph x 5 mintutes with 1-2 hand hold    Status  Achieved      PT SHORT TERM GOAL #3   Title  Patient will tolerate up/down 4 steps x 2 (total 8 steps) with SaO2>=88% and RPE <=14    Baseline  06/15/18: met today with SaO2 91%, RPE <10    Status  Achieved        PT Long Term Goals - 06/29/18 1408      PT LONG TERM GOAL #1   Title  Pt will amb. 4" nonstop to demo improved activity tolerance.  (Target for LTGs updated to 07/16/18--date extended due to 2  week lapse in appts due to high census)    Baseline  2" 45 secs nonstop; 3 min 15 sec  Time  8    Period  Weeks    Status  Partially Met      PT LONG TERM GOAL #2   Title  Pt will perform 5 times sit to stand in </= 8 secs to demo increased strength and endurance.    Baseline  10.13 secs; 6/24 12.00 seconds    Time  8    Period  Weeks    Status  On-going      PT LONG TERM GOAL #3   Title  Pt will negotiate 12 step with 1 rail using a step over step sequence with PRE rating </= 4/10 intensity.    Time  8    Period  Weeks    Status  Deferred   pt recently hospitalized and deferred assessment on first appt after     PT LONG TERM GOAL #4   Title  Independent in HEP for strengthening and balance exercises.     Baseline  6/24 was independent prior to recent hospitalization but has been unable to resume since return home    Time  8    Period  Weeks    Status  Partially Met      PT LONG TERM GOAL #5   Title  Pt will be able to participate in pulmonary rehab upon D/C from PT.    Time  8    Period  Weeks    Status  On-going            Plan - 06/29/18 1357    Clinical Impression Statement  Pt's O2 sat rate dropped to 78% after performing step up exercise 10 reps each leg (O2 remained on 2L - pt had not increased oxygen to 3L at start of session);  O2 rate increased after pt adjusted O2 to 3L, took seated rest period and performed pursed lip breathing for approx. 2".  Pt tolerated remainder of session well with frequent short seated rest breaks needed with standing activities.    Rehab Potential  Good    PT Frequency  2x / week    PT Duration  8 weeks    PT Treatment/Interventions  ADLs/Self Care Home Management;Therapeutic exercise;Therapeutic activities;Gait training;Stair training;Balance training;Neuromuscular re-education;Patient/family education;Energy conservation    PT Next Visit Plan  check goals and D/C next session if he starts pulmonary rehab on 07-02-18 as scheduled     PT Home Exercise Plan  gave balance exercises on 04-23-18 - kicks, SLS, sidestepping and marching    Consulted and Agree with Plan of Care  Patient       Patient will benefit from skilled therapeutic intervention in order to improve the following deficits and impairments:  Difficulty walking, Decreased balance, Decreased activity tolerance, Cardiopulmonary status limiting activity, Decreased endurance  Visit Diagnosis: Other abnormalities of gait and mobility  Muscle weakness (generalized)  Unsteadiness on feet     Problem List Patient Active Problem List   Diagnosis Date Noted  . Iron deficiency anemia, unspecified 04/20/2018  . PVCs (premature ventricular contractions) 02/02/2018  . Pressure injury of skin 01/18/2018  . Influenza-like illness 01/17/2018  . History of pneumonia 12/16/2017  . OSA on CPAP 09/22/2017  . Paroxysmal atrial fibrillation ([facility])   . Malnutrition of moderate degree 09/10/2017  . Protein-calorie malnutrition, severe 12/09/2016  . Dysphagia 08/23/2016  . Normocytic anemia 06/28/2016  . Pressure sore on buttocks 06/28/2016  . Pulmonary hypertension ([place]) 02/20/2016  . GERD (gastroesophageal reflux disease) 02/20/2016  . COPD with acute exacerbation ([place]) 02/13/2016  .  Acute respiratory failure with hypoxia ([place]) 02/12/2016  . Hypertension 02/11/2016  . Hyperlipidemia 02/11/2016  . CAP (community acquired pneumonia)   . BPH (benign prostatic hyperplasia) 12/29/2015  . COPD with emphysema ([place]) 10/02/2015  . Chronic hypoxemic respiratory failure ([place]) 10/02/2015  . History of pulmonary hypertension 10/02/2015    DildayJenness Corner, PT 06/29/2018, 2:09 PM  [facility] [address] [place], [place], [address] Phone: [phone]   Fax:  [phone]  Name: Andrew [name] MRN: [id number] Date of Birth: 26-Jan-1941

## 2018-07-01 ENCOUNTER — Telehealth: Payer: Self-pay | Admitting: Pulmonary Disease

## 2018-07-01 MED ORDER — TEMAZEPAM 30 MG PO CAPS: 30.0000 mg | ORAL_CAPSULE | Freq: Every day | ORAL | 3 refills | Status: DC

## 2018-07-01 NOTE — Telephone Encounter (Signed)
Pt is calling back [phone] 

## 2018-07-01 NOTE — Telephone Encounter (Signed)
Called and spoke with pt regarding refill on temazepam 30mg  daily at bedtime Placed order to pharmacy today Pt verbalized understanding, and had no further questions. Nothing further needed.

## 2018-07-01 NOTE — Telephone Encounter (Signed)
lmtcb x1 for pt. 

## 2018-07-02 ENCOUNTER — Ambulatory Visit: Payer: Medicare Other | Admitting: Physical Therapy

## 2018-07-02 ENCOUNTER — Encounter: Payer: Self-pay | Admitting: Physical Therapy

## 2018-07-02 DIAGNOSIS — R2689 Other abnormalities of gait and mobility: Secondary | ICD-10-CM | POA: Diagnosis not present

## 2018-07-02 DIAGNOSIS — M6281 Muscle weakness (generalized): Secondary | ICD-10-CM

## 2018-07-02 DIAGNOSIS — R2681 Unsteadiness on feet: Secondary | ICD-10-CM

## 2018-07-02 NOTE — Therapy (Signed)
[facility] [address] [place], [place], [address] Phone: [phone]   Fax:  [phone]  Physical Therapy Treatment  Patient Details  Name: Andrew [name] MRN: [id number] Date of Birth: 10-01-1941 Referring Provider: Dr. Teressa Lower    Encounter Date: 07/02/2018  PT End of Session - 07/02/18 1124    Visit Number  12    Number of Visits  25    Date for PT Re-Evaluation  07/05/18    Authorization Type  Medicare    Authorization Time Period  1-88-41 - 6-60-; recertification 05/18/15 to 08/09/2018    PT Start Time  1103    PT Stop Time  1145    PT Time Calculation (min)  42 min    Equipment Utilized During Treatment  Oxygen    Activity Tolerance  Patient tolerated treatment well    Behavior During Therapy  [facility] for tasks assessed/performed       Past Medical History:  Diagnosis Date  . Anemia   . BiPAP (biphasic positive airway pressure) dependence    Pt denies history of OSA  . BPH (benign prostatic hyperplasia)   . Cancer ([place])    skin  . COPD (chronic obstructive pulmonary disease) ([place])   . Dysphagia   . Emphysema lung ([place])   . GERD (gastroesophageal reflux disease)    " Silent reflux"  . Hearing loss    right ear  . History of hiatal hernia   . HLD (hyperlipidemia)   . Hypertension   . Oxygen dependent    2-3 liters  . Oxygen dependent   . Pneumonia   . Pulmonary hypertension ([place])   . Sleep apnea    wears cpap    Past Surgical History:  Procedure Laterality Date  . CARDIAC CATHETERIZATION    . CATARACT EXTRACTION W/ INTRAOCULAR LENS  IMPLANT, BILATERAL    . COLONOSCOPY WITH PROPOFOL N/A 04/20/2018   Procedure: COLONOSCOPY WITH PROPOFOL;  Surgeon: Wilford Corner, MD;  Location: [facility];  Service: Endoscopy;  Laterality: N/A;  . ESOPHAGOGASTRODUODENOSCOPY (EGD) WITH PROPOFOL N/A 08/23/2016   Procedure: ESOPHAGOGASTRODUODENOSCOPY (EGD) WITH PROPOFOL;  Surgeon: Wilford Corner, MD;   Location: [facility] ENDOSCOPY;  Service: Endoscopy;  Laterality: N/A;  . ESOPHAGOGASTRODUODENOSCOPY (EGD) WITH PROPOFOL N/A 04/20/2018   Procedure: ESOPHAGOGASTRODUODENOSCOPY (EGD) WITH PROPOFOL;  Surgeon: Wilford Corner, MD;  Location: [facility];  Service: Endoscopy;  Laterality: N/A;  . HOT HEMOSTASIS N/A 04/20/2018   Procedure: HOT HEMOSTASIS (ARGON PLASMA COAGULATION/BICAP);  Surgeon: Wilford Corner, MD;  Location: [facility];  Service: Endoscopy;  Laterality: N/A;  . LUNG SURGERY    . POLYPECTOMY  04/20/2018   Procedure: POLYPECTOMY;  Surgeon: Wilford Corner, MD;  Location: [facility] ENDOSCOPY;  Service: Endoscopy;;  . TONSILLECTOMY      There were no vitals filed for this visit.  Subjective Assessment - 07/02/18 1105    Subjective  States he will do his Pulmonary Rehab eval on Monday 8/19 and if can get started there, today will be his last PT session.    Pertinent History  Chronic hypoxemic respiratory failure, pulmonary HTN, centrilobular emphysema, paroxysmal atrial fibrillation:  COPD    Patient Stated Goals  improve strength and endurance to be able to participate in pulmonary rehab and then to return to [facility] for exercise    Currently in Pain?  No/denies                       [facility] Adult PT Treatment/Exercise - 07/02/18  1110      Transfers   Transfers  Sit to Stand;Stand to Sit    Sit to Stand  6: Modified independent (Device/Increase time)    Five time sit to stand comments   10.03      Ambulation/Gait   Ambulation/Gait Assistance  5: Supervision    Ambulation Distance (Feet)  --   80 ft x 2; 50 ft x 2   Stairs Assistance  5: Supervision    Stairs Assistance Details (indicate cue type and reason)  O2 sats 88-94% on 3L O2    Stair Management Technique  One rail Right;Alternating pattern;Forwards    Number of Stairs  4   x 5 reps consecutively   Height of Stairs  6      Knee/Hip Exercises: Aerobic   Tread Mill  1.3 mph O2 3L with SaO2 89-91%, HR HR 75-90 x  10 minutes          Balance Exercises - 07/02/18 1620      Balance Exercises: Standing   SLS with Vectors  Foam/compliant surface;5 reps   each leg touching 3 foam bubbles for incr hip strenth and ba         PT Short Term Goals - 06/15/18 1151      PT SHORT TERM GOAL #1   Title  Patient will tolerate continuous 15 minutes on Nustep using all 4 extremities with steps/minute >60, SaO2>=88% and RPE <=14 (Target all STGs 06/18/2018-date extended due to 2 week gap in appts due to high census)    Baseline  06/15/18: met today in session with SaO2 >/=91% and RPE11 after 15 minutes with >/= 60 steps per minute    Status  Achieved      PT SHORT TERM GOAL #2   Title  Patient will ambulate 5 consecutive minutes on treadmill with SaO2>=88% and RPE <=14    Baseline  06/15/18: SaO2 91%, RPE= 11,  1.3 mph x 5 mintutes with 1-2 hand hold    Status  Achieved      PT SHORT TERM GOAL #3   Title  Patient will tolerate up/down 4 steps x 2 (total 8 steps) with SaO2>=88% and RPE <=14    Baseline  06/15/18: met today with SaO2 91%, RPE <10    Status  Achieved        PT Long Term Goals - 07/02/18 1124      PT LONG TERM GOAL #1   Title  Pt will amb. 4" nonstop to demo improved activity tolerance.  (Target for LTGs updated to 07/16/18--date extended due to 2 week lapse in appts due to high census)    Baseline  2" 45 secs nonstop; 3 min 15 sec; 8/12 4 min 32 sec    Time  8    Period  Weeks    Status  Achieved      PT LONG TERM GOAL #2   Title  Pt will perform 5 times sit to stand in </= 8 secs to demo increased strength and endurance.    Baseline  10.13 secs; 6/24 12.00 seconds; 8/15 10.0 sec     Time  8    Period  Weeks    Status  Partially Met      PT LONG TERM GOAL #3   Title  Pt will negotiate 12 step with 1 rail using a step over step sequence with PRE rating </= 4/10 intensity.    Time  8    Period  Weeks  Status  Deferred   pt recently hospitalized and deferred assessment on first  appt after     PT LONG TERM GOAL #4   Title  Independent in HEP for strengthening and balance exercises.     Baseline  6/24 was independent prior to recent hospitalization but has been unable to resume since return home    Time  8    Period  Weeks    Status  Achieved      PT LONG TERM GOAL #5   Title  Pt will be able to participate in pulmonary rehab upon D/C from PT.    Time  8    Period  Weeks    Status  On-going            Plan - 07/02/18 1622    Clinical Impression Statement  SaO2 >=88% on 3L for entire session (generally 90-95% throughout). Pt requested one seated rest break after 10 minutes on treadmill and one standing rest break after stair training.     Rehab Potential  Good    PT Frequency  2x / week    PT Duration  8 weeks    PT Treatment/Interventions  ADLs/Self Care Home Management;Therapeutic exercise;Therapeutic activities;Gait training;Stair training;Balance training;Neuromuscular re-education;Patient/family education;Energy conservation    PT Next Visit Plan  D/C next session if he starts pulmonary rehab on 07-02-18 as scheduled    PT Home Exercise Plan  gave balance exercises on 04-23-18 - kicks, SLS, sidestepping and marching    Consulted and Agree with Plan of Care  Patient       Patient will benefit from skilled therapeutic intervention in order to improve the following deficits and impairments:  Difficulty walking, Decreased balance, Decreased activity tolerance, Cardiopulmonary status limiting activity, Decreased endurance  Visit Diagnosis: Other abnormalities of gait and mobility  Muscle weakness (generalized)  Unsteadiness on feet     Problem List Patient Active Problem List   Diagnosis Date Noted  . Iron deficiency anemia, unspecified 04/20/2018  . PVCs (premature ventricular contractions) 02/02/2018  . Pressure injury of skin 01/18/2018  . Influenza-like illness 01/17/2018  . History of pneumonia 12/16/2017  . OSA on CPAP 09/22/2017  .  Paroxysmal atrial fibrillation ([facility])   . Malnutrition of moderate degree 09/10/2017  . Protein-calorie malnutrition, severe 12/09/2016  . Dysphagia 08/23/2016  . Normocytic anemia 06/28/2016  . Pressure sore on buttocks 06/28/2016  . Pulmonary hypertension ([place]) 02/20/2016  . GERD (gastroesophageal reflux disease) 02/20/2016  . COPD with acute exacerbation ([place]) 02/13/2016  . Acute respiratory failure with hypoxia ([place]) 02/12/2016  . Hypertension 02/11/2016  . Hyperlipidemia 02/11/2016  . CAP (community acquired pneumonia)   . BPH (benign prostatic hyperplasia) 12/29/2015  . COPD with emphysema ([place]) 10/02/2015  . Chronic hypoxemic respiratory failure ([place]) 10/02/2015  . History of pulmonary hypertension 10/02/2015    Rexanne Mano, PT 07/02/2018, 4:30 PM  [facility] [address] [place], [place], [address] Phone: [phone]   Fax:  [phone]  Name: Andrew [name] MRN: [id number] Date of Birth: Mar 19, 1941

## 2018-07-06 ENCOUNTER — Encounter (HOSPITAL_COMMUNITY)
Admission: RE | Admit: 2018-07-06 | Discharge: 2018-07-06 | Disposition: A | Discharge status: Home / Self Care | Payer: Medicare Other | Source: Ambulatory Visit | Attending: Pulmonary Disease | Admitting: Pulmonary Disease

## 2018-07-06 ENCOUNTER — Ambulatory Visit: Payer: Medicare Other | Admitting: Physical Therapy

## 2018-07-06 ENCOUNTER — Encounter (HOSPITAL_COMMUNITY): Payer: Self-pay

## 2018-07-06 VITALS — BP 128/59 | HR 65 | Temp 98.3°F | Resp 18 | Ht 67.0 in | Wt 142.9 lb

## 2018-07-06 DIAGNOSIS — J9611 Chronic respiratory failure with hypoxia: Secondary | ICD-10-CM | POA: Diagnosis not present

## 2018-07-06 DIAGNOSIS — J438 Other emphysema: Secondary | ICD-10-CM

## 2018-07-06 DIAGNOSIS — J439 Emphysema, unspecified: Secondary | ICD-10-CM | POA: Diagnosis not present

## 2018-07-06 NOTE — Progress Notes (Signed)
Andrew [name] 77 y.o. male Pulmonary Rehab Orientation Note Andrew [name], referred to pulmonary rehab by Dr. Leeanne Deed for pulmonary rehab with diagnosis of Emphysema,  arrived today in Cardiac and Pulmonary Rehab for orientation to Pulmonary Rehab. He was transported from [employer] via wheel chair. Andrew [name] participated in pulmonary rehab about two years ago.  Pt is remembered very fondly by staff.  Andrew [name] does carry portable oxygen. Per pt, he uses oxygen continuously. Color good, skin warm and dry. Patient is oriented to time and place. Patient's medical history, psychosocial health, and medications reviewed. Psychosocial assessment reveals pt lives with their daughter and her family. Pt is currently retired. Pt hobbies include reading car magazines and tinkering on a car he has restored. Pt reports his stress level is non existant.  Pt does not exhibit  signs of depression.Pt has a very positive and realistic outlook on life and feels supported by his children and grandchildren. PHQ2/9 score 0/1. Pt shows good  coping skills with positive outlook . Will continue to monitor and evaluate progress toward psychosocial goal(s) of continued mental well being. Physical assessment reveals heart rate is normal, pt has history of paroxymal afib.  Pt is under the care of Dr. Oval Linsey. Pt  breath sounds clear to auscultation, no wheezes, rales, or rhonchi, diminished. Grip strength equal, strong. Distal pulses palpable with no swelling. Patient reports hedoes take medications as prescribed. Patient states he follows a Regular diet. The patient reports no specific efforts to gain or lose weight. Pt has regained the weight he lost when he was very sick.  Pt would like to maintain the weight that he is now.  Patient's weight will be monitored closely. Demonstration and practice of PLB using pulse oximeter. Patient able to return demonstration satisfactorily. Safety and hand hygiene in the exercise area reviewed with patient. Patient  voices understanding of the information reviewed. Department expectations discussed with patient and achievable goals were set. The patient shows enthusiasm about attending the program and we look forward to working with this nice gentleman The patient is scheduled for a 6 min walk test on 8/22 and to begin exercise on 8/29 at 10:30.  45 minutes was spent on a variety of activities such as assessment of the patient, obtaining baseline data including height, weight, BMI, and grip strength, verifying medical history, allergies, and current medications, and teaching patient strategies for performing tasks with less respiratory effort with emphasis on pursed lip breathing. Andrew [name], BSN Cardiac and Training and development officer [phone]

## 2018-07-07 NOTE — Progress Notes (Signed)
Andrew [name] 77 y.o. male  DOB: Dec 22, 1940 MRN: [id number]           Nutrition Note 1. Other emphysema ([place])    Past Medical History:  Diagnosis Date  . Anemia   . BiPAP (biphasic positive airway pressure) dependence    Pt denies history of OSA  . BPH (benign prostatic hyperplasia)   . Cancer ([place])    skin  . COPD (chronic obstructive pulmonary disease) ([place])   . Dysphagia   . Emphysema lung ([place])   . GERD (gastroesophageal reflux disease)    " Silent reflux"  . Hearing loss    right ear  . History of hiatal hernia   . HLD (hyperlipidemia)   . Hypertension   . Oxygen dependent    2-3 liters  . Oxygen dependent   . Pneumonia   . Pulmonary hypertension ([place])   . Sleep apnea    wears cpap   Meds reviewed. Crestor, omeprasole, MVI, Vit C, lopressor noted  Ht: Ht Readings from Last 1 Encounters:  07/06/18 5\' 7"  (1.702 m)     Wt:  Wt Readings from Last 3 Encounters:  07/06/18 142 lb 13.7 oz (64.8 kg)  06/25/18 143 lb 9.6 oz (65.1 kg)  05/06/18 137 lb (62.1 kg)     BMI: Body mass index is 22.37 kg/m.    Current tobacco use? no  Labs:  Lipid Panel  No results found for: CHOL, TRIG, HDL, CHOLHDL, VLDL, LDLCALC, LDLDIRECT  Lab Results  Component Value Date   HGBA1C 6.1 11/25/2016    Nutrition Diagnosis  ? Food-and nutrition-related knowledge deficit related to lack of exposure to information as related to diagnosis of pulmonary disease   Goal(s)  1. Pt to identify and limit food sources of sodium. 2. The pt will recognize symptoms that can interfere with adequate oral intake, such as shortness of breath, N/V, early satiety, fatigue, ability to secure and prepare food, taste and smell changes, chewing/swallowing difficulties, and/ or pain when eating.  3. The pt will consume high-energy, high-nutrient dense beverages when necessary to compensate for decreased oral intake of solid foods. 4. Identify food quantities necessary to achieve wt maintenance at  graduation from pulmonary rehab. 5. Pt able to name foods that affect blood glucose   Plan:  Pt to attend Pulmonary Nutrition class Will provide client-centered nutrition education as part of interdisciplinary care.    Monitor and Evaluate progress toward nutrition goal with team.   Laurina Bustle, MS, RD, LDN 07/07/2018 8:28 AM

## 2018-07-08 ENCOUNTER — Telehealth: Payer: Self-pay | Admitting: Pulmonary Disease

## 2018-07-08 ENCOUNTER — Encounter (HOSPITAL_COMMUNITY): Payer: Self-pay | Admitting: *Deleted

## 2018-07-08 MED ORDER — TEMAZEPAM 30 MG PO CAPS: 30.0000 mg | ORAL_CAPSULE | Freq: Every day | ORAL | 3 refills | Status: DC

## 2018-07-08 NOTE — Telephone Encounter (Signed)
This Rx was sent into [facility]. I called pt but there was no answer and VM to leave message. Will try again later.

## 2018-07-09 ENCOUNTER — Telehealth: Payer: Self-pay | Admitting: *Deleted

## 2018-07-09 ENCOUNTER — Ambulatory Visit: Payer: Medicare Other | Admitting: Physical Therapy

## 2018-07-09 ENCOUNTER — Encounter (HOSPITAL_COMMUNITY)
Admission: RE | Admit: 2018-07-09 | Discharge: 2018-07-09 | Disposition: A | Discharge status: Home / Self Care | Payer: Medicare Other | Source: Ambulatory Visit | Attending: Pulmonary Disease | Admitting: Pulmonary Disease

## 2018-07-09 DIAGNOSIS — J439 Emphysema, unspecified: Secondary | ICD-10-CM

## 2018-07-09 DIAGNOSIS — J9611 Chronic respiratory failure with hypoxia: Secondary | ICD-10-CM | POA: Diagnosis not present

## 2018-07-09 MED ORDER — TEMAZEPAM 30 MG PO CAPS: 30.0000 mg | ORAL_CAPSULE | Freq: Every day | ORAL | 5 refills | Status: AC

## 2018-07-09 NOTE — Telephone Encounter (Signed)
Called and spoke to pt.  Pt is requesting refill on Temazepam 30mg  #30 with 5 refills.  Pt last seen 06/25/18. Preferred pharmacy is [facility].   SN please advise. Thanks  Current Outpatient Medications on File Prior to Visit  Medication Sig Dispense Refill  . ADVAIR DISKUS 250-50 MCG/DOSE AEPB Inhale 1 puff into the lungs 2 (two) times daily. 60 each 2  . albuterol (PROVENTIL HFA;VENTOLIN HFA) 108 (90 Base) MCG/ACT inhaler Inhale 2 puffs into the lungs every 6 (six) hours as needed for wheezing or shortness of breath. 1 Inhaler 11  . apixaban (ELIQUIS) 5 MG TABS tablet Take 1 tablet (5 mg total) by mouth 2 (two) times daily. 180 tablet 1  . azithromycin (ZITHROMAX) 250 MG tablet Take as directed (Patient not taking: Reported on 07/06/2018) 6 tablet 2  . benzonatate (TESSALON) 100 MG capsule TAKE 1 CAPSULE THREE TIMES DAILY AS NEEDED FOR COUGH. (Patient not taking: Reported on 07/06/2018) 90 capsule 0  . diltiazem (CARDIZEM CD) 180 MG 24 hr capsule Take 1 capsule (180 mg total) by mouth daily. 90 capsule 2  . finasteride (PROSCAR) 5 MG tablet Take 5 mg by mouth at bedtime.     . fluticasone (FLONASE) 50 MCG/ACT nasal spray Place 1 spray into both nostrils at bedtime.     Marland Kitchen guaiFENesin (MUCINEX) 600 MG 12 hr tablet Take 1 tablet (600 mg total) by mouth 2 (two) times daily. 30 tablet 0  . hydrocortisone cream 1 % Apply 1 application topically daily as needed (Eczema on face and head).    Marland Kitchen ipratropium-albuterol (DUONEB) 0.5-2.5 (3) MG/3ML SOLN Take 3 mLs by nebulization 3 (three) times daily. 360 mL 2  . liver oil-zinc oxide (DESITIN) 40 % ointment Apply 1 application topically daily.    . metoprolol tartrate (LOPRESSOR) 25 MG tablet Take 0.5 tablets (12.5 mg total) by mouth 2 (two) times daily. 90 tablet 3  . Multiple Vitamins-Minerals (MULTIVITAMIN WITH MINERALS) tablet Take 1 tablet by mouth daily.    Marland Kitchen omeprazole (PRILOSEC) 40 MG capsule Take 40 mg by mouth 2 (two) times daily.      . OXYGEN Inhale 2-3 L into the lungs See admin instructions. 2L daytime, 3L at night     . polyethylene glycol (MIRALAX / GLYCOLAX) packet Take 17 g by mouth daily. (Patient taking differently: Take 17 g by mouth at bedtime. Mix in 8 oz liquid and drink) 14 each 1  . predniSONE (DELTASONE) 10 MG tablet Take 4 tabs daily for 3 days, then 3 tabs daily for 3 days, then 2 tabs daily for 3 days, then continue 1 tab daily maintenance as you were doing before. 60 tablet 0  . rosuvastatin (CRESTOR) 10 MG tablet Take 10 mg by mouth daily.    . sildenafil (REVATIO) 20 MG tablet Take 1 tablet (20 mg total) by mouth 2 (two) times daily. 180 tablet 3  . SPIRIVA HANDIHALER 18 MCG inhalation capsule INHALE THE CONTENTS OF 1 CAPSULE EVERY DAY 90 capsule 3  . tamsulosin (FLOMAX) 0.4 MG CAPS capsule Take 1 capsule (0.4 mg total) by mouth daily after breakfast. 30 capsule 0  . vitamin C (ASCORBIC ACID) 500 MG tablet Take 500 mg by mouth 2 (two) times daily.      No current facility-administered medications on file prior to visit.     No Known Allergies

## 2018-07-09 NOTE — Telephone Encounter (Signed)
Per previous note Restoril was faxed to [facility], mail order pharmacy, 05/08/18.  Called [facility] order [facility], spoke to Ramey.  At this time there was nothing showing Restoril had been refilled.  Caryn Bee was going to place a note, so Restoril will not be mailed out.  Called refill into to Patient's preferred pharmacy, [facility] in [place], [place], with 5 refills. Patient aware of refill.  Nothing further at this time.

## 2018-07-09 NOTE — Telephone Encounter (Signed)
Received notification Eliquis approved for patient assistance, patient aware

## 2018-07-13 ENCOUNTER — Ambulatory Visit: Payer: Medicare Other | Admitting: Physical Therapy

## 2018-07-13 DIAGNOSIS — M6281 Muscle weakness (generalized): Secondary | ICD-10-CM | POA: Diagnosis not present

## 2018-07-13 DIAGNOSIS — R2689 Other abnormalities of gait and mobility: Secondary | ICD-10-CM

## 2018-07-13 DIAGNOSIS — R2681 Unsteadiness on feet: Secondary | ICD-10-CM | POA: Diagnosis not present

## 2018-07-13 NOTE — Therapy (Signed)
[facility] [address] [place], [place], [address] Phone: [phone]   Fax:  [phone]  Physical Therapy Treatment and discharge summary  Patient Details  Name: Andrew [name] MRN: [id number] Date of Birth: 01/20/1941 Referring Provider: Dr. Teressa Lower    Encounter Date: 07/13/2018  PT End of Session - 07/13/18 1130    Visit Number  13    Number of Visits  25    Date for PT Re-Evaluation  07/05/18    Authorization Type  Medicare    Authorization Time Period  2-63-33 - 5-45-; recertification 05/12/62 to 08/09/2018    PT Start Time  1105    PT Stop Time  1145    PT Time Calculation (min)  40 min    Equipment Utilized During Treatment  Oxygen    Activity Tolerance  Patient tolerated treatment well    Behavior During Therapy  [facility] for tasks assessed/performed       Past Medical History:  Diagnosis Date  . Anemia   . BiPAP (biphasic positive airway pressure) dependence    Pt denies history of OSA  . BPH (benign prostatic hyperplasia)   . Cancer ([place])    skin  . COPD (chronic obstructive pulmonary disease) ([place])   . Dysphagia   . Emphysema lung ([place])   . GERD (gastroesophageal reflux disease)    " Silent reflux"  . Hearing loss    right ear  . History of hiatal hernia   . HLD (hyperlipidemia)   . Hypertension   . Oxygen dependent    2-3 liters  . Oxygen dependent   . Pneumonia   . Pulmonary hypertension ([place])   . Sleep apnea    wears cpap    Past Surgical History:  Procedure Laterality Date  . CARDIAC CATHETERIZATION    . CATARACT EXTRACTION W/ INTRAOCULAR LENS  IMPLANT, BILATERAL    . COLONOSCOPY WITH PROPOFOL N/A 04/20/2018   Procedure: COLONOSCOPY WITH PROPOFOL;  Surgeon: Wilford Corner, MD;  Location: [place];  Service: Endoscopy;  Laterality: N/A;  . ESOPHAGOGASTRODUODENOSCOPY (EGD) WITH PROPOFOL N/A 08/23/2016   Procedure: ESOPHAGOGASTRODUODENOSCOPY (EGD) WITH PROPOFOL;  Surgeon: Wilford Corner, MD;  Location[facility]COPY;  Service: Endoscopy;  Laterality: N/A;  . ESOPHAGOGASTRODUODENOSCOPY (EGD) WITH PROPOFOL N/A 04/20/2018   Procedure: ESOPHAGOGASTRODUODENOSCOPY (EGD) WITH PROPOFOL;  Surgeon: Wilford Corner, MD;  Location: [place];  Service: Endoscopy;  Laterality: N/A;  . HOT HEMOSTASIS N/A 04/20/2018   Procedure: HOT HEMOSTASIS (ARGON PLASMA COAGULATION/BICAP);  Surgeon: Wilford Corner, MD;  Location: [place];  Service: Endoscopy;  Laterality: N/A;  . LUNG SURGERY    . POLYPECTOMY  04/20/2018   Procedure: POLYPECTOMY;  Surgeon: Wilford Corner, MD;  Location: [facility] ENDOSCOPY;  Service: Endoscopy;;  . TONSILLECTOMY      There were no vitals filed for this visit.  Subjective Assessment - 07/13/18 1107    Subjective  Walked the entire 6 minute test for Pulm rehab (pushing cart holding O2 tank). Knows he is better and able to be out in community more (out to eat with family/friends)    Pertinent History  Chronic hypoxemic respiratory failure, pulmonary HTN, centrilobular emphysema, paroxysmal atrial fibrillation:  COPD    Patient Stated Goals  improve strength and endurance to be able to participate in pulmonary rehab and then to return to [facility] for exercise    Currently in Pain?  No/denies  [facility] Adult PT Treatment/Exercise - 07/13/18 1111      Transfers   Transfers  Sit to Stand;Stand to Sit    Sit to Stand  7: Independent    Five time sit to stand comments   8.75   did not use hands     Ambulation/Gait   Ambulation/Gait Assistance  6: Modified independent (Device/Increase time)    Ambulation/Gait Assistance Details  on 2L O2 (pt carrying person tank) x 4 minutes; SaO2 down to 83%, once seated returned to 90% in 1 min 5 esc; RPE 7/10    Assistive device  None    Gait Pattern  Within Functional Limits    Ambulation Surface  Level;Indoor      Knee/Hip Exercises: Aerobic   Nustep  L4 x 15 min HR 61 Sao2 95%       *Patient required rest periods between each activity to reduce dyspnea and improve SaO2         PT Short Term Goals - 06/15/18 1151      PT SHORT TERM GOAL #1   Title  Patient will tolerate continuous 15 minutes on Nustep using all 4 extremities with steps/minute >60, SaO2>=88% and RPE <=14 (Target all STGs 06/18/2018-date extended due to 2 week gap in appts due to high census)    Baseline  06/15/18: met today in session with SaO2 >/=91% and RPE11 after 15 minutes with >/= 60 steps per minute    Status  Achieved      PT SHORT TERM GOAL #2   Title  Patient will ambulate 5 consecutive minutes on treadmill with SaO2>=88% and RPE <=14    Baseline  06/15/18: SaO2 91%, RPE= 11,  1.3 mph x 5 mintutes with 1-2 hand hold    Status  Achieved      PT SHORT TERM GOAL #3   Title  Patient will tolerate up/down 4 steps x 2 (total 8 steps) with SaO2>=88% and RPE <=14    Baseline  06/15/18: met today with SaO2 91%, RPE <10    Status  Achieved        PT Long Term Goals - 07/13/18 1126      PT LONG TERM GOAL #1   Title  Pt will amb. 4" nonstop to demo improved activity tolerance.  (Target for LTGs updated to 07/16/18--date extended due to 2 week lapse in appts due to high census)    Baseline  2" 45 secs nonstop; 3 min 15 sec; 8/12 4 min 32 sec; 8/26 4 min 22 sec    Time  8    Period  Weeks    Status  Achieved      PT LONG TERM GOAL #2   Title  Pt will perform 5 times sit to stand in </= 8 secs to demo increased strength and endurance.    Baseline  10.13 secs; 6/24 12.00 seconds; 8/15 10.0 sec 8/26 8.75 sec    Time  8    Period  Weeks    Status  Partially Met      PT LONG TERM GOAL #3   Title  Pt will negotiate 12 step with 1 rail using a step over step sequence with PRE rating </= 4/10 intensity.    Baseline  20 steps no rail to light use of rail    Time  8    Period  Weeks    Status  Achieved   pt recently hospitalized and deferred assessment on first appt after  PT LONG TERM  GOAL #4   Title  Independent in HEP for strengthening and balance exercises.     Baseline  6/24 was independent prior to recent hospitalization but has been unable to resume since return home    Time  8    Period  Weeks    Status  Achieved      PT LONG TERM GOAL #5   Title  Pt will be able to participate in pulmonary rehab upon D/C from PT.    Baseline  will start 07/16/18 (has already done intake testing/orientation)    Time  8    Period  Weeks    Status  Achieved            Plan - 07/13/18 1128    Clinical Impression Statement  LTGs assessed with pt meeting 4 of 5 goals and partially met 5th goal (improved, but not to goal level). Patient reports he knows his breathing and strength are both better and looking forward to starting pulmonary rehab. Patient is discharged from PT.     Rehab Potential  Good    PT Frequency  2x / week    PT Duration  8 weeks    PT Treatment/Interventions  ADLs/Self Care Home Management;Therapeutic exercise;Therapeutic activities;Gait training;Stair training;Balance training;Neuromuscular re-education;Patient/family education;Energy conservation    PT Home Exercise Plan  gave balance exercises on 04-23-18 - kicks, SLS, sidestepping and marching    Consulted and Agree with Plan of Care  Patient       Patient will benefit from skilled therapeutic intervention in order to improve the following deficits and impairments:  Difficulty walking, Decreased balance, Decreased activity tolerance, Cardiopulmonary status limiting activity, Decreased endurance  Visit Diagnosis: Other abnormalities of gait and mobility  Muscle weakness (generalized)     Problem List Patient Active Problem List   Diagnosis Date Noted  . Iron deficiency anemia, unspecified 04/20/2018  . PVCs (premature ventricular contractions) 02/02/2018  . Pressure injury of skin 01/18/2018  . Influenza-like illness 01/17/2018  . History of pneumonia 12/16/2017  . OSA on CPAP 09/22/2017  .  Paroxysmal atrial fibrillation ([facility])   . Malnutrition of moderate degree 09/10/2017  . Protein-calorie malnutrition, severe 12/09/2016  . Dysphagia 08/23/2016  . Normocytic anemia 06/28/2016  . Pressure sore on buttocks 06/28/2016  . Pulmonary hypertension ([place]) 02/20/2016  . GERD (gastroesophageal reflux disease) 02/20/2016  . COPD with acute exacerbation ([place]) 02/13/2016  . Acute respiratory failure with hypoxia ([place]) 02/12/2016  . Hypertension 02/11/2016  . Hyperlipidemia 02/11/2016  . CAP (community acquired pneumonia)   . BPH (benign prostatic hyperplasia) 12/29/2015  . COPD with emphysema ([place]) 10/02/2015  . Chronic hypoxemic respiratory failure ([place]) 10/02/2015  . History of pulmonary hypertension 10/02/2015   PHYSICAL THERAPY DISCHARGE SUMMARY  Visits from Start of Care: 13  Current functional level related to goals / functional outcomes: See LTGs above   Remaining deficits: Limited exercise tolerance due to COPD and O2 dependence   Education / Equipment: HEP; monitoring symptoms of increased activity and knowing when to take rest periods (general energy conservation techniques)  Plan: Patient agrees to discharge.  Patient goals were partially met. Patient is being discharged due to being pleased with the current functional level.  ?????       Rexanne Mano, PT 07/14/2018, 10:02 AM  [facility] [address] [place], [place], [address] Phone: [phone]   Fax:  [phone]  Name: Elzia Hott MRN: [id number] Date of Birth:  September 10, 1941

## 2018-07-13 NOTE — Progress Notes (Signed)
Pulmonary Individual Treatment Plan  Patient Details  Name: Andrew [name] MRN: [id number] Date of Birth: 07/30/1941 Referring Provider:     Pulmonary Rehab Walk Test from 07/09/2018 in [facility]  Referring Provider  Dr. Lenna Gil[facility]      Initial Encounter Date:    Pulmonary Rehab Walk Test from 07/09/2018 in [place]  Date  07/13/18      Visit Diagnosis: Pulmonary emphysema, unspecified emphysema type ([place])  Patient's Home Medications on Admission:   Current Outpatient Medications:  .  ADVAIR DISKUS 250-50 MCG/DOSE AEPB, Inhale 1 puff into the lungs 2 (two) times daily., Disp: 60 each, Rfl: 2 .  albuterol (PROVENTIL HFA;VENTOLIN HFA) 108 (90 Base) MCG/ACT inhaler, Inhale 2 puffs into the lungs every 6 (six) hours as needed for wheezing or shortness of breath., Disp: 1 Inhaler, Rfl: 11 .  apixaban (ELIQUIS) 5 MG TABS tablet, Take 1 tablet (5 mg total) by mouth 2 (two) times daily., Disp: 180 tablet, Rfl: 1 .  azithromycin (ZITHROMAX) 250 MG tablet, Take as directed (Patient not taking: Reported on 07/06/2018), Disp: 6 tablet, Rfl: 2 .  benzonatate (TESSALON) 100 MG capsule, TAKE 1 CAPSULE THREE TIMES DAILY AS NEEDED FOR COUGH. (Patient not taking: Reported on 07/06/2018), Disp: 90 capsule, Rfl: 0 .  diltiazem (CARDIZEM CD) 180 MG 24 hr capsule, Take 1 capsule (180 mg total) by mouth daily., Disp: 90 capsule, Rfl: 2 .  finasteride (PROSCAR) 5 MG tablet, Take 5 mg by mouth at bedtime. , Disp: , Rfl:  .  fluticasone (FLONASE) 50 MCG/ACT nasal spray, Place 1 spray into both nostrils at bedtime. , Disp: , Rfl:  .  guaiFENesin (MUCINEX) 600 MG 12 hr tablet, Take 1 tablet (600 mg total) by mouth 2 (two) times daily., Disp: 30 tablet, Rfl: 0 .  hydrocortisone cream 1 %, Apply 1 application topically daily as needed (Eczema on face and head)., Disp: , Rfl:  .  ipratropium-albuterol (DUONEB) 0.5-2.5 (3) MG/3ML SOLN, Take 3 mLs by  nebulization 3 (three) times daily., Disp: 360 mL, Rfl: 2 .  liver oil-zinc oxide (DESITIN) 40 % ointment, Apply 1 application topically daily., Disp: , Rfl:  .  metoprolol tartrate (LOPRESSOR) 25 MG tablet, Take 0.5 tablets (12.5 mg total) by mouth 2 (two) times daily., Disp: 90 tablet, Rfl: 3 .  Multiple Vitamins-Minerals (MULTIVITAMIN WITH MINERALS) tablet, Take 1 tablet by mouth daily., Disp: , Rfl:  .  omeprazole (PRILOSEC) 40 MG capsule, Take 40 mg by mouth 2 (two) times daily. , Disp: , Rfl:  .  OXYGEN, Inhale 2-3 L into the lungs See admin instructions. 2L daytime, 3L at night , Disp: , Rfl:  .  polyethylene glycol (MIRALAX / GLYCOLAX) packet, Take 17 g by mouth daily. (Patient taking differently: Take 17 g by mouth at bedtime. Mix in 8 oz liquid and drink), Disp: 14 each, Rfl: 1 .  predniSONE (DELTASONE) 10 MG tablet, Take 4 tabs daily for 3 days, then 3 tabs daily for 3 days, then 2 tabs daily for 3 days, then continue 1 tab daily maintenance as you were doing before., Disp: 60 tablet, Rfl: 0 .  rosuvastatin (CRESTOR) 10 MG tablet, Take 10 mg by mouth daily., Disp: , Rfl:  .  sildenafil (REVATIO) 20 MG tablet, Take 1 tablet (20 mg total) by mouth 2 (two) times daily., Disp: 180 tablet, Rfl: 3 .  SPIRIVA HANDIHALER 18 MCG inhalation capsule, INHALE THE CONTENTS OF 1 CAPSULE EVERY  DAY, Disp: 90 capsule, Rfl: 3 .  tamsulosin (FLOMAX) 0.4 MG CAPS capsule, Take 1 capsule (0.4 mg total) by mouth daily after breakfast., Disp: 30 capsule, Rfl: 0 .  temazepam (RESTORIL) 30 MG capsule, Take 1 capsule (30 mg total) by mouth at bedtime., Disp: 30 capsule, Rfl: 5 .  vitamin C (ASCORBIC ACID) 500 MG tablet, Take 500 mg by mouth 2 (two) times daily. , Disp: , Rfl:   Past Medical History: Past Medical History:  Diagnosis Date  . Anemia   . BiPAP (biphasic positive airway pressure) dependence    Pt denies history of OSA  . BPH (benign prostatic hyperplasia)   . Cancer ([place])    skin  . COPD (chronic  obstructive pulmonary disease) ([place])   . Dysphagia   . Emphysema lung ([place])   . GERD (gastroesophageal reflux disease)    " Silent reflux"  . Hearing loss    right ear  . History of hiatal hernia   . HLD (hyperlipidemia)   . Hypertension   . Oxygen dependent    2-3 liters  . Oxygen dependent   . Pneumonia   . Pulmonary hypertension ([place])   . Sleep apnea    wears cpap    Tobacco Use: Social History   Tobacco Use  Smoking Status Former Smoker  . Packs/day: 0.00  . Years: 0.00  . Pack years: 0.00  . Types: Cigarettes  . Last attempt to quit: 10/01/1984  . Years since quitting: 33.8  Smokeless Tobacco Never Used    Labs: Recent Chemical engineer    Labs for ITP Cardiac and Pulmonary Rehab Latest Ref Rng & Units 01/02/2016 01/10/2016 06/28/2016 11/25/2016 12/07/2016   Hemoglobin A1c 4.6 - 6.5 % 6.2(H) - - 6.1 -   PHART 7.350 - 7.450 - - 7.444 - 7.442   PCO2ART 32.0 - 48.0 mmHg - - 35.2 - 38.8   HCO3 20.0 - 28.0 mmol/L - - 24.1(H) - 26.1   TCO2 0 - 100 mmol/L - 26 25 - -   O2SAT % - - 94.0 - 98.5      Capillary Blood Glucose: Lab Results  Component Value Date   GLUCAP 115 (H) 01/21/2018   GLUCAP 125 (H) 01/21/2018   GLUCAP 132 (H) 01/20/2018   GLUCAP 173 (H) 01/19/2018     Pulmonary Assessment Scores: Pulmonary Assessment Scores    Row Name 07/08/18 1515 07/13/18 0933       ADL UCSD   ADL Phase  Entry  Entry    SOB Score total  73  -      CAT Score   CAT Score  24  -      mMRC Score   mMRC Score  -  3       Pulmonary Function Assessment:   Exercise Target Goals: Exercise Program Goal: Individual exercise prescription set using results from initial 6 min walk test and THRR while considering  patient's activity barriers and safety.   Exercise Prescription Goal: Initial exercise prescription builds to 30-45 minutes a day of aerobic activity, 2-3 days per week.  Home exercise guidelines will be given to patient during program as part of exercise  prescription that the participant will acknowledge.  Activity Barriers & Risk Stratification: Activity Barriers & Cardiac Risk Stratification - 07/06/18 1405      Activity Barriers & Cardiac Risk Stratification   Activity Barriers  (S) Shortness of Breath;Balance Concerns;None;Neck/Spine Problems   meniere's disease   Cardiac Risk Stratification  Moderate       6 Minute Walk: 6 Minute Walk    Row Name 07/13/18 1006         6 Minute Walk   Phase  Initial     Distance  800 feet     Walk Time  6 minutes     # of Rest Breaks  0     MPH  1.5     METS  2.15     RPE  13     Perceived Dyspnea   3     Symptoms  Yes (comment)     Comments  used wheelchair     Resting HR  69 bpm     Resting BP  120/64     Resting Oxygen Saturation   97 %     Exercise Oxygen Saturation  during 6 min walk  95 %     Max Ex. HR  85 bpm     Max Ex. BP  130/60       Interval HR   1 Minute HR  73     2 Minute HR  81     3 Minute HR  85     4 Minute HR  79     5 Minute HR  85     6 Minute HR  80     2 Minute Post HR  73     Interval Heart Rate?  Yes       Interval Oxygen   Interval Oxygen?  Yes     Baseline Oxygen Saturation %  97 %     1 Minute Oxygen Saturation %  98 %     1 Minute Liters of Oxygen  3 L     2 Minute Oxygen Saturation %  96 %     2 Minute Liters of Oxygen  3 L     3 Minute Oxygen Saturation %  95 %     3 Minute Liters of Oxygen  3 L     4 Minute Oxygen Saturation %  95 %     4 Minute Liters of Oxygen  3 L     5 Minute Oxygen Saturation %  96 %     5 Minute Liters of Oxygen  3 L     6 Minute Oxygen Saturation %  97 %     6 Minute Liters of Oxygen  3 L     2 Minute Post Oxygen Saturation %  98 %     2 Minute Post Liters of Oxygen  3 L        Oxygen Initial Assessment: Oxygen Initial Assessment - 07/13/18 0930      Initial 6 min Walk   Oxygen Used  Continuous;E-Tanks    Liters per minute  3      Program Oxygen Prescription   Program Oxygen Prescription   Continuous;E-Tanks    Liters per minute  3       Oxygen Re-Evaluation:   Oxygen Discharge (Final Oxygen Re-Evaluation):   Initial Exercise Prescription: Initial Exercise Prescription - 07/13/18 1000      Date of Initial Exercise RX and Referring Provider   Date  07/13/18    Referring Provider  Dr. Lenna [place]      Oxygen   Oxygen  Continuous    Liters  3      NuStep   Level  2    SPM  80    Minutes  17  METs  1.5      Arm Ergometer   Level  2    Watts  10    Minutes  17      Track   Laps  5    Minutes  17      Prescription Details   Frequency (times per week)  2    Duration  Progress to 45 minutes of aerobic exercise without signs/symptoms of physical distress      Intensity   THRR 40-80% of Max Heartrate  57-114    Ratings of Perceived Exertion  11-13    Perceived Dyspnea  0-4      Progression   Progression  Continue progressive overload as per policy without signs/symptoms or physical distress.      Resistance Training   Training Prescription  Yes    Weight  blue bands    Reps  10-15       Perform Capillary Blood Glucose checks as needed.  Exercise Prescription Changes:   Exercise Comments:   Exercise Goals and Review: Exercise Goals    Row Name 07/06/18 1406             Exercise Goals   Increase Physical Activity  Yes       Intervention  Provide advice, education, support and counseling about physical activity/exercise needs.;Develop an individualized exercise prescription for aerobic and resistive training based on initial evaluation findings, risk stratification, comorbidities and participant's personal goals.       Expected Outcomes  Short Term: Attend rehab on a regular basis to increase amount of physical activity.;Long Term: Add in home exercise to make exercise part of routine and to increase amount of physical activity.;Long Term: Exercising regularly at least 3-5 days a week.       Increase Strength and Stamina  Yes        Intervention  Provide advice, education, support and counseling about physical activity/exercise needs.;Develop an individualized exercise prescription for aerobic and resistive training based on initial evaluation findings, risk stratification, comorbidities and participant's personal goals.       Expected Outcomes  Short Term: Increase workloads from initial exercise prescription for resistance, speed, and METs.;Short Term: Perform resistance training exercises routinely during rehab and add in resistance training at home;Long Term: Improve cardiorespiratory fitness, muscular endurance and strength as measured by increased METs and functional capacity (6MWT)       Able to understand and use rate of perceived exertion (RPE) scale  Yes       Intervention  Provide education and explanation on how to use RPE scale       Expected Outcomes  Short Term: Able to use RPE daily in rehab to express subjective intensity level;Long Term:  Able to use RPE to guide intensity level when exercising independently       Able to understand and use Dyspnea scale  Yes       Intervention  Provide education and explanation on how to use Dyspnea scale       Expected Outcomes  Short Term: Able to use Dyspnea scale daily in rehab to express subjective sense of shortness of breath during exertion;Long Term: Able to use Dyspnea scale to guide intensity level when exercising independently       Knowledge and understanding of Target Heart Rate Range (THRR)  Yes       Intervention  Provide education and explanation of THRR including how the numbers were predicted and where they are located for reference  Expected Outcomes  Short Term: Able to state/look up THRR;Short Term: Able to use daily as guideline for intensity in rehab;Long Term: Able to use THRR to govern intensity when exercising independently       Understanding of Exercise Prescription  Yes       Intervention  Provide education, explanation, and written materials on  patient's individual exercise prescription       Expected Outcomes  Short Term: Able to explain program exercise prescription;Long Term: Able to explain home exercise prescription to exercise independently          Exercise Goals Re-Evaluation :   Discharge Exercise Prescription (Final Exercise Prescription Changes):   Nutrition:  Target Goals: Understanding of nutrition guidelines, daily intake of sodium 1500mg , cholesterol 200mg , calories 30% from fat and 7% or less from saturated fats, daily to have 5 or more servings of fruits and vegetables.  Biometrics:    Nutrition Therapy Plan and Nutrition Goals: Nutrition Therapy & Goals - 07/07/18 0830      Nutrition Therapy   Diet  general healthful      Personal Nutrition Goals   Nutrition Goal  identify and limit dietary sources of sodium    Personal Goal #2  Identify food quantities necessary to achieve wt maintenance at graduation from pulmonary rehab      Intervention Plan   Intervention  Prescribe, educate and counsel regarding individualized specific dietary modifications aiming towards targeted core components such as weight, hypertension, lipid management, diabetes, heart failure and other comorbidities.    Expected Outcomes  Short Term Goal: Understand basic principles of dietary content, such as calories, fat, sodium, cholesterol and nutrients.       Nutrition Assessments: Nutrition Assessments - 07/07/18 0827      Rate Your Plate Scores   Pre Score  51       Nutrition Goals Re-Evaluation: Nutrition Goals Re-Evaluation    [place] Name 07/07/18 0827 07/07/18 0830           Goals   Current Weight  142 lb 13.7 oz (64.8 kg)  142 lb 13.7 oz (64.8 kg)      Nutrition Goal  identify and limit dietary sources of sodium  -      Comment  -  Pt wt is down 2 lb since admission         Nutrition Goals Discharge (Final Nutrition Goals Re-Evaluation): Nutrition Goals Re-Evaluation - 07/07/18 0830      Goals   Current  Weight  142 lb 13.7 oz (64.8 kg)    Comment  Pt wt is down 2 lb since admission       Psychosocial: Target Goals: Acknowledge presence or absence of significant depression and/or stress, maximize coping skills, provide positive support system. Participant is able to verbalize types and ability to use techniques and skills needed for reducing stress and depression.  Initial Review & Psychosocial Screening: Initial Psych Review & Screening - 07/06/18 1411      Initial Review   Current issues with  None Identified      Family Dynamics   Good Support System?  Yes    Comments  lives with daughter who is very supportive      Barriers   Psychosocial barriers to participate in program  There are no identifiable barriers or psychosocial needs.      Screening Interventions   Interventions  Encouraged to exercise    Expected Outcomes  Short Term goal: Identification and review with participant of any Quality  of Life or Depression concerns found by scoring the questionnaire.;Long Term goal: The participant improves quality of Life and PHQ9 Scores as seen by post scores and/or verbalization of changes       Quality of Life Scores:  Scores of 19 and below usually indicate a poorer quality of life in these areas.  A difference of  2-3 points is a clinically meaningful difference.  A difference of 2-3 points in the total score of the Quality of Life Index has been associated with significant improvement in overall quality of life, self-image, physical symptoms, and general health in studies assessing change in quality of life.  PHQ-9: Recent Review Flowsheet Data    Depression screen [facility] 2/9 07/06/2018 08/15/2016 06/20/2016 04/29/2016   Decreased Interest 0 0 0 0   Down, Depressed, Hopeless 0 0 0 0   PHQ - 2 Score 0 0 0 0   Altered sleeping 1 - - -   Tired, decreased energy 0 - - -   Change in appetite 0 - - -   Feeling bad or failure about yourself  0 - - -   Trouble concentrating 0 - - -    Moving slowly or fidgety/restless 0 - - -   Suicidal thoughts 0 - - -   PHQ-9 Score 1 - - -   Difficult doing work/chores Not difficult at all - - -     Interpretation of Total Score  Total Score Depression Severity:  1-4 = Minimal depression, 5-9 = Mild depression, 10-14 = Moderate depression, 15-19 = Moderately severe depression, 20-27 = Severe depression   Psychosocial Evaluation and Intervention: Psychosocial Evaluation - 07/06/18 1412      Psychosocial Evaluation & Interventions   Interventions  Encouraged to exercise with the program and follow exercise prescription    Comments  none identified    Continue Psychosocial Services   Follow up required by staff       Psychosocial Re-Evaluation:   Psychosocial Discharge (Final Psychosocial Re-Evaluation):   Education: Education Goals: Education classes will be provided on a weekly basis, covering required topics. Participant will state understanding/return demonstration of topics presented.  Learning Barriers/Preferences: Learning Barriers/Preferences - 07/06/18 1412      Learning Barriers/Preferences   Learning Barriers  Sight;Hearing    Learning Preferences  Written Material;Skilled Demonstration       Education Topics: Risk Factor Reduction:  -Group instruction that is supported by a PowerPoint presentation. Instructor discusses the definition of a risk factor, different risk factors for pulmonary disease, and how the heart and lungs work together.     Nutrition for Pulmonary Patient:  -Group instruction provided by PowerPoint slides, verbal discussion, and written materials to support subject matter. The instructor gives an explanation and review of healthy diet recommendations, which includes a discussion on weight management, recommendations for fruit and vegetable consumption, as well as protein, fluid, caffeine, fiber, sodium, sugar, and alcohol. Tips for eating when patients are short of breath are discussed.    PULMONARY REHAB OTHER RESPIRATORY from 08/08/2016 in [place]  Date  06/27/16  Educator  edna  Instruction Review Code (Retired)  2- meets goals/outcomes      Pursed Lip Breathing:  -Group instruction that is supported by demonstration and informational handouts. Instructor discusses the benefits of pursed lip and diaphragmatic breathing and detailed demonstration on how to preform both.     PULMONARY REHAB OTHER RESPIRATORY from 08/08/2016 in [place]  Date  07/25/16  Educator  RT  Instruction Review Code (Retired)  R- Review/reinforce      Oxygen Safety:  -Group instruction provided by PowerPoint, verbal discussion, and written material to support subject matter. There is an overview of "What is Oxygen" and "Why do we need it".  Instructor also reviews how to create a safe environment for oxygen use, the importance of using oxygen as prescribed, and the risks of noncompliance. There is a brief discussion on traveling with oxygen and resources the patient may utilize.   PULMONARY REHAB OTHER RESPIRATORY from 08/08/2016 in [place]  Date  07/11/16  Educator  RN  Instruction Review Code (Retired)  2- meets goals/outcomes      Oxygen Equipment:  -Group instruction provided by [employer], Network engineer, and Insurance underwriter.   PULMONARY REHAB OTHER RESPIRATORY from 05/30/2016 in [place]  Date  05/16/16  Educator  [employer] rep  Instruction Review Code (Retired)  2- meets goals/outcomes      Signs and Symptoms:  -Group instruction provided by written material and verbal discussion to support subject matter. Warning signs and symptoms of infection, stroke, and heart attack are reviewed and when to call the physician/911 reinforced. Tips for preventing the spread of infection discussed.   Advanced Directives:  -Group  instruction provided by verbal instruction and written material to support subject matter. Instructor reviews Advanced Directive laws and proper instruction for filling out document.   Pulmonary Video:  -Group video education that reviews the importance of medication and oxygen compliance, exercise, good nutrition, pulmonary hygiene, and pursed lip and diaphragmatic breathing for the pulmonary patient.   PULMONARY REHAB OTHER RESPIRATORY from 08/08/2016 in [place]  Date  07/18/16  Instruction Review Code (Retired)  2- meets goals/outcomes      Exercise for the Pulmonary Patient:  -Group instruction that is supported by a PowerPoint presentation. Instructor discusses benefits of exercise, core components of exercise, frequency, duration, and intensity of an exercise routine, importance of utilizing pulse oximetry during exercise, safety while exercising, and options of places to exercise outside of rehab.     PULMONARY REHAB OTHER RESPIRATORY from 08/08/2016 in [place]  Date  08/08/16  Educator  EP  Instruction Review Code (Retired)  2- meets goals/outcomes      Pulmonary Medications:  -Verbally interactive group education provided by Art therapist with focus on inhaled medications and proper administration.   Anatomy and Physiology of the Respiratory System and Intimacy:  -Group instruction provided by PowerPoint, verbal discussion, and written material to support subject matter. Instructor reviews respiratory cycle and anatomical components of the respiratory system and their functions. Instructor also reviews differences in obstructive and restrictive respiratory diseases with examples of each. Intimacy, Sex, and Sexuality differences are reviewed with a discussion on how relationships can change when diagnosed with pulmonary disease. Common sexual concerns are reviewed.   PULMONARY REHAB OTHER RESPIRATORY from 08/08/2016 in  [place]  Date  06/06/16  Educator  RN  Instruction Review Code (Retired)  2- meets goals/outcomes      MD DAY -A group question and answer session with a medical doctor that allows participants to ask questions that relate to their pulmonary disease state.   OTHER EDUCATION -Group or individual verbal, written, or video instructions that support the educational goals of the pulmonary rehab program.   Holiday Eating Survival Tips:  -  Group instruction provided by PowerPoint slides, verbal discussion, and written materials to support subject matter. The instructor gives patients tips, tricks, and techniques to help them not only survive but enjoy the holidays despite the onslaught of food that accompanies the holidays.   Knowledge Questionnaire Score: Knowledge Questionnaire Score - 07/08/18 1514      Knowledge Questionnaire Score   Pre Score  16/18       Core Components/Risk Factors/Patient Goals at Admission: Personal Goals and Risk Factors at Admission - 07/06/18 1415      Core Components/Risk Factors/Patient Goals on Admission    Weight Management  Yes;Weight Maintenance    Admit Weight  142 lb 13.7 oz (64.8 kg)    Goal Weight: Long Term  140 lb (63.5 kg)    Expected Outcomes  Understanding of distribution of calorie intake throughout the day with the consumption of 4-5 meals/snacks;Understanding recommendations for meals to include 15-35% energy as protein, 25-35% energy from fat, 35-60% energy from carbohydrates, less than 200mg  of dietary cholesterol, 20-35 gm of total fiber daily;Weight Maintenance: Understanding of the daily nutrition guidelines, which includes 25-35% calories from fat, 7% or less cal from saturated fats, less than 200mg  cholesterol, less than 1.5gm of sodium, & 5 or more servings of fruits and vegetables daily;Short Term: Continue to assess and modify interventions until short term weight is achieved;Long Term: Adherence to  nutrition and physical activity/exercise program aimed toward attainment of established weight goal    Improve shortness of breath with ADL's  Yes    Intervention  Provide education, individualized exercise plan and daily activity instruction to help decrease symptoms of SOB with activities of daily living.    Expected Outcomes  Short Term: Improve cardiorespiratory fitness to achieve a reduction of symptoms when performing ADLs;Long Term: Be able to perform more ADLs without symptoms or delay the onset of symptoms       Core Components/Risk Factors/Patient Goals Review:    Core Components/Risk Factors/Patient Goals at Discharge (Final Review):    ITP Comments: ITP Comments    Row Name 07/06/18 1350           ITP Comments  Dr. Jennet Maduro, Medical Director          Comments:

## 2018-07-16 ENCOUNTER — Ambulatory Visit: Payer: Medicare Other | Admitting: Physical Therapy

## 2018-07-16 ENCOUNTER — Encounter (HOSPITAL_COMMUNITY)
Admission: RE | Admit: 2018-07-16 | Discharge: 2018-07-16 | Disposition: A | Discharge status: Home / Self Care | Payer: Medicare Other | Source: Ambulatory Visit | Attending: Pulmonary Disease | Admitting: Pulmonary Disease

## 2018-07-16 DIAGNOSIS — J438 Other emphysema: Secondary | ICD-10-CM

## 2018-07-16 DIAGNOSIS — J439 Emphysema, unspecified: Secondary | ICD-10-CM

## 2018-07-16 DIAGNOSIS — J9611 Chronic respiratory failure with hypoxia: Secondary | ICD-10-CM | POA: Diagnosis not present

## 2018-07-16 NOTE — Progress Notes (Signed)
Daily Session Note  Patient Details  Name: Andrew [name] MRN: [id number] Date of Birth: 11-Oct-1941 Referring Provider:     Pulmonary Rehab Walk Test from 07/09/2018 in [place]  Referring Provider  Dr. Lenna [name]      Encounter Date: 07/16/2018  Check In: Session Check In - 07/16/18 1102      Check-In   Supervising physician immediately available to respond to emergencies  [facility] immediately available    Physician(s)  Dr. Florene [name]    Location  [facility]    Staff Present  Andrew Hilt, MS, ACSM RCEP, Exercise Physiologist;Andrew Wilber Oliphant, RN, BSN;Andrew Dredge, RN, MHA    Medication changes reported      No    Fall or balance concerns reported     No    Tobacco Cessation  No Change    Warm-up and Cool-down  Performed as group-led instruction    Resistance Training Performed  Yes    VAD Patient?  No    PAD/SET Patient?  No      Pain Assessment   Currently in Pain?  No/denies    Multiple Pain Sites  No       Capillary Blood Glucose: No results found for this or any previous visit (from the past 24 hour(s)).    Social History   Tobacco Use  Smoking Status Former Smoker  . Packs/day: 0.00  . Years: 0.00  . Pack years: 0.00  . Types: Cigarettes  . Last attempt to quit: 10/01/1984  . Years since quitting: 33.8  Smokeless Tobacco Never Used    Goals Met:  Exercise tolerated well  Goals Unmet:  Not Applicable  Comments: Service time is from 10:30a to 12:45p    Dr. Rush [name] is Medical Director for Pulmonary Rehab at [facility]

## 2018-07-21 ENCOUNTER — Ambulatory Visit: Payer: Medicare Other | Admitting: Physical Therapy

## 2018-07-21 ENCOUNTER — Encounter (HOSPITAL_COMMUNITY)
Admission: RE | Admit: 2018-07-21 | Discharge: 2018-07-21 | Disposition: A | Discharge status: Home / Self Care | Payer: Medicare Other | Source: Ambulatory Visit | Attending: Pulmonary Disease | Admitting: Pulmonary Disease

## 2018-07-21 VITALS — Wt 143.3 lb

## 2018-07-21 DIAGNOSIS — J438 Other emphysema: Secondary | ICD-10-CM

## 2018-07-21 DIAGNOSIS — J9611 Chronic respiratory failure with hypoxia: Secondary | ICD-10-CM | POA: Insufficient documentation

## 2018-07-21 DIAGNOSIS — J439 Emphysema, unspecified: Secondary | ICD-10-CM | POA: Diagnosis not present

## 2018-07-21 NOTE — Progress Notes (Signed)
Daily Session Note  Patient Details  Name: Andrew [name] MRN: [id number] Date of Birth: [birth year] Referring Provider:     Pulmonary Rehab Walk Test from 07/09/2018 in [place]  Referring Provider  Dr. Lenna Gilford      Encounter Date: 07/21/2018  Check In: Session Check In - 07/21/18 1019      Check-In   Supervising physician immediately available to respond to emergencies  [facility] immediately available    Physician(s)  Dr. Karleen Hampshire    Location  [facility]    Staff Present  Su Hilt, MS, ACSM RCEP, Exercise Physiologist;[name], RN, BSN;Ramon Dredge, RN, MHA;Lisa Ysidro Evert, RN    Medication changes reported      No    Fall or balance concerns reported     No    Tobacco Cessation  No Change    Warm-up and Cool-down  Performed as group-led instruction    Resistance Training Performed  Yes    VAD Patient?  No    PAD/SET Patient?  No      Pain Assessment   Currently in Pain?  No/denies    Multiple Pain Sites  No       Capillary Blood Glucose: No results found for this or any previous visit (from the past 24 hour(s)).  Exercise Prescription Changes - 07/21/18 1200      Response to Exercise   Blood Pressure (Admit)  112/62    Blood Pressure (Exercise)  120/56    Blood Pressure (Exit)  102/58    Heart Rate (Admit)  54 bpm    Heart Rate (Exercise)  74 bpm    Heart Rate (Exit)  57 bpm    Oxygen Saturation (Admit)  99 %    Oxygen Saturation (Exercise)  97 %    Oxygen Saturation (Exit)  100 %    Rating of Perceived Exertion (Exercise)  12    Perceived Dyspnea (Exercise)  2    Duration  Progress to 45 minutes of aerobic exercise without signs/symptoms of physical distress    Intensity  THRR unchanged      Progression   Progression  Continue to progress workloads to maintain intensity without signs/symptoms of physical distress.      Resistance Training   Training Prescription  Yes    Weight  blue  bands    Reps  10-15      Oxygen   Oxygen  Continuous    Liters  3      Treadmill   MPH  1.3    Grade  0    Minutes  17      NuStep   Level  2    SPM  80    Minutes  17    METs  1.6      Arm Ergometer   Level  2    Watts  10    Minutes  17       Social History   Tobacco Use  Smoking Status Former Smoker  . Packs/day: 0.00  . Years: 0.00  . Pack years: 0.00  . Types: Cigarettes  . Last attempt to quit: 10/01/1984  . Years since quitting: 33.8  Smokeless Tobacco Never Used    Goals Met:  Exercise tolerated well  Goals Unmet:  Not Applicable  Comments: Service time is from 10:30a to 12:15p    Dr. Rush Farmer is Medical Director for Pulmonary Rehab at [facility].

## 2018-07-23 ENCOUNTER — Encounter (HOSPITAL_COMMUNITY)
Admission: RE | Admit: 2018-07-23 | Discharge: 2018-07-23 | Disposition: A | Discharge status: Home / Self Care | Payer: Medicare Other | Source: Ambulatory Visit | Attending: Pulmonary Disease | Admitting: Pulmonary Disease

## 2018-07-23 ENCOUNTER — Ambulatory Visit: Payer: Medicare Other | Admitting: Physical Therapy

## 2018-07-23 DIAGNOSIS — J439 Emphysema, unspecified: Secondary | ICD-10-CM | POA: Diagnosis not present

## 2018-07-23 DIAGNOSIS — J438 Other emphysema: Secondary | ICD-10-CM

## 2018-07-23 DIAGNOSIS — J9611 Chronic respiratory failure with hypoxia: Secondary | ICD-10-CM | POA: Diagnosis not present

## 2018-07-23 NOTE — Progress Notes (Signed)
Daily Session Note  Patient Details  Name: Andrew [name] MRN: [id number] Date of Birth: 1941/06/15 Referring Provider:     Pulmonary Rehab Walk Test from 07/09/2018 in [place]  Referring Provider  Dr. Lenna Gilford      Encounter Date: 07/23/2018  Check In: Session Check In - 07/23/18 1141      Check-In   Supervising physician immediately available to respond to emergencies  [facility] immediately available    Physician(s)  Dr. Darrick Meigs     Location  [facility]    Staff Present  Rodney Langton, RN;[name], RN, BSN;Joann Rion, RN, BSN;Ramon Dredge, RN, MHA    Medication changes reported      No    Fall or balance concerns reported     No    Tobacco Cessation  No Change    Warm-up and Cool-down  Performed as group-led instruction    Resistance Training Performed  Yes    VAD Patient?  No    PAD/SET Patient?  No      Pain Assessment   Currently in Pain?  No/denies       Capillary Blood Glucose: No results found for this or any previous visit (from the past 24 hour(s)).    Social History   Tobacco Use  Smoking Status Former Smoker  . Packs/day: 0.00  . Years: 0.00  . Pack years: 0.00  . Types: Cigarettes  . Last attempt to quit: 10/01/1984  . Years since quitting: 33.8  Smokeless Tobacco Never Used    Goals Met:  Exercise tolerated well No report of cardiac concerns or symptoms Strength training completed today  Goals Unmet:  Not Applicable  Comments: Service time is from 1030 to 1150    Dr. Rush Farmer is Medical Director for Pulmonary Rehab at [facility].

## 2018-07-27 ENCOUNTER — Encounter: Payer: Self-pay | Admitting: Cardiovascular Disease

## 2018-07-27 ENCOUNTER — Ambulatory Visit (INDEPENDENT_AMBULATORY_CARE_PROVIDER_SITE_OTHER): Payer: Medicare Other | Admitting: Cardiovascular Disease

## 2018-07-27 VITALS — BP 101/57 | HR 61 | Ht 67.0 in | Wt 143.2 lb

## 2018-07-27 DIAGNOSIS — I10 Essential (primary) hypertension: Secondary | ICD-10-CM | POA: Diagnosis not present

## 2018-07-27 DIAGNOSIS — I480 Paroxysmal atrial fibrillation: Secondary | ICD-10-CM

## 2018-07-27 DIAGNOSIS — G4733 Obstructive sleep apnea (adult) (pediatric): Secondary | ICD-10-CM

## 2018-07-27 DIAGNOSIS — J449 Chronic obstructive pulmonary disease, unspecified: Secondary | ICD-10-CM | POA: Diagnosis not present

## 2018-07-27 NOTE — Patient Instructions (Signed)
Medication Instructions:  STOP METOPROLOL   Labwork: NONE  Testing/Procedures: NONE  Follow-Up: Your physician wants you to follow-up in: 6 MONTHS  You will receive a reminder letter in the mail two months in advance. If you don't receive a letter, please call our office to schedule the follow-up appointment.  If you need a refill on your cardiac medications before your next appointment, please call your pharmacy.

## 2018-07-27 NOTE — Progress Notes (Signed)
Cardiology Office Note   Date:  07/27/2018   ID:  Andrew [name], DOB May 11, 1941, MRN [id number]  PCP:  Lujean Amel, MD  Cardiologist:   Skeet Latch, MD   No chief complaint on file.    History of Present Illness: Andrew [name] is a 77 y.o. 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  Andrew [name] was admitted 08/2017 with pneumonia.  The hospitalization was complicated by atrial fibrillation with rapid ventricular response.  He had more than one episode of atrial fibrillation during the admission.  It was thought to be due to his pneumonia.  He was already on diltiazem prior to admission and metoprolol was added to his regimen.  He was started on Eliquis for anticoagulation.  Thyroid function was normal.  He had an echo that admission that revealed LVEF 55-60%.  It was noted that he has pulmonary hypertension from severe COPD.  He was previously treated in [place] and was started on sildenafil.  He has been working with Dr. Leeanne Deed the dose was lowered however, the patient declined further weaning.    Andrew [name] saw his pulmonologist and was noted to be anemic.  He had positive FOBT testing.  He was referred to Dr. Michail Sermon and underwent upper and lower endoscopy 04/2018.  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  This was treated with APC.  He also had 2 colonic polyps removed.  Since that time Andrew [name] has been doing well.  He did physical therapy and is currently enrolled in pulmonary rehab.  He goes twice per week for 1-1/2 hours each times.  His breathing is still poor with exertion but he is feeling better.  He has no chest pain, lower extremity edema, orthopnea, or PND.  He has no lightheadedness or dizziness.  His blood pressure has been mostly in the  185U to 314 systolic.  One time in rehab his systolic was 96.   Past Medical History:  Diagnosis Date  . Anemia   . BiPAP (biphasic positive airway pressure) dependence    Pt denies history of OSA  . BPH (benign prostatic hyperplasia)   . Cancer ([place])    skin  . COPD (chronic obstructive pulmonary disease) ([place])   . Dysphagia   . Emphysema lung ([place])   . GERD (gastroesophageal reflux disease)    " Silent reflux"  . Hearing loss    right ear  . History of hiatal hernia   . HLD (hyperlipidemia)   . Hypertension   . Oxygen dependent    2-3 liters  . Oxygen dependent   . Pneumonia   . Pulmonary hypertension ([place])   . Sleep apnea    wears cpap    Past Surgical History:  Procedure Laterality Date  . CARDIAC CATHETERIZATION    . CATARACT EXTRACTION W/ INTRAOCULAR LENS  IMPLANT, BILATERAL    . COLONOSCOPY WITH PROPOFOL N/A 04/20/2018   Procedure: COLONOSCOPY WITH PROPOFOL;  Surgeon: Wilford Corner, MD;  Location: [place];  Service: Endoscopy;  Laterality: N/A;  . ESOPHAGOGASTRODUODENOSCOPY (EGD) WITH PROPOFOL N/A 08/23/2016   Procedure: ESOPHAGOGASTRODUODENOSCOPY (EGD) WITH PROPOFOL;  Surgeon: Wilford Corner, MD;  Location: [facility] ENDOSCOPY;  Service: Endoscopy;  Laterality: N/A;  . ESOPHAGOGASTRODUODENOSCOPY (EGD) WITH PROPOFOL N/A 04/20/2018   Procedure: ESOPHAGOGASTRODUODENOSCOPY (EGD) WITH PROPOFOL;  Surgeon: Wilford Corner, MD;  [facility];  Service: Endoscopy;  Laterality: N/A;  . HOT HEMOSTASIS N/A 04/20/2018   Procedure: HOT HEMOSTASIS (ARGON PLASMA COAGULATION/BICAP);  Surgeon: Wilford Corner, MD;  Location: [place];  Service: Endoscopy;  Laterality: N/A;  . LUNG SURGERY    . POLYPECTOMY  04/20/2018   Procedure: POLYPECTOMY;  Surgeon: Wilford Corner, MD;  Location: [facility] ENDOSCOPY;  Service: Endoscopy;;  . TONSILLECTOMY       Current Outpatient Medications  Medication Sig Dispense Refill  . ADVAIR DISKUS 250-50 MCG/DOSE AEPB Inhale 1 puff into  the lungs 2 (two) times daily. 60 each 2  . albuterol (PROVENTIL HFA;VENTOLIN HFA) 108 (90 Base) MCG/ACT inhaler Inhale 2 puffs into the lungs every 6 (six) hours as needed for wheezing or shortness of breath. 1 Inhaler 11  . apixaban (ELIQUIS) 5 MG TABS tablet Take 1 tablet (5 mg total) by mouth 2 (two) times daily. 180 tablet 1  . diltiazem (CARDIZEM CD) 180 MG 24 hr capsule Take 1 capsule (180 mg total) by mouth daily. 90 capsule 2  . finasteride (PROSCAR) 5 MG tablet Take 5 mg by mouth at bedtime.     . fluticasone (FLONASE) 50 MCG/ACT nasal spray Place 1 spray into both nostrils at bedtime.     Marland Kitchen guaiFENesin (MUCINEX) 600 MG 12 hr tablet Take 1 tablet (600 mg total) by mouth 2 (two) times daily. 30 tablet 0  . hydrocortisone cream 1 % Apply 1 application topically daily as needed (Eczema on face and head).    Marland Kitchen ipratropium-albuterol (DUONEB) 0.5-2.5 (3) MG/3ML SOLN Take 3 mLs by nebulization 3 (three) times daily. 360 mL 2  . liver oil-zinc oxide (DESITIN) 40 % ointment Apply 1 application topically daily.    . Multiple Vitamins-Minerals (MULTIVITAMIN WITH MINERALS) tablet Take 1 tablet by mouth daily.    Marland Kitchen omeprazole (PRILOSEC) 40 MG capsule Take 40 mg by mouth 2 (two) times daily.     . OXYGEN Inhale 2-3 L into the lungs See admin instructions. 2L daytime, 3L at night     . polyethylene glycol (MIRALAX / GLYCOLAX) packet Take 17 g by mouth daily. (Patient taking differently: Take 17 g by mouth at bedtime. Mix in 8 oz liquid and drink) 14 each 1  . predniSONE (DELTASONE) 10 MG tablet Take 4 tabs daily for 3 days, then 3 tabs daily for 3 days, then 2 tabs daily for 3 days, then continue 1 tab daily maintenance as you were doing before. 60 tablet 0  . rosuvastatin (CRESTOR) 10 MG tablet Take 10 mg by mouth daily.    . sildenafil (REVATIO) 20 MG tablet Take 1 tablet (20 mg total) by mouth 2 (two) times daily. 180 tablet 3  . SPIRIVA HANDIHALER 18 MCG inhalation capsule INHALE THE CONTENTS OF 1  CAPSULE EVERY DAY 90 capsule 3  . tamsulosin (FLOMAX) 0.4 MG CAPS capsule Take 1 capsule (0.4 mg total) by mouth daily after breakfast. 30 capsule 0  . temazepam (RESTORIL) 30 MG capsule Take 1 capsule (30 mg total) by mouth at bedtime. 30 capsule 5  . vitamin C (ASCORBIC ACID) 500 MG tablet Take 500 mg by mouth 2 (two) times daily.      No current facility-administered medications for this visit.     Allergies:   Patient has no known allergies.    Social History:  The patient  reports that he quit smoking about 33 years ago. His smoking use included cigarettes. He smoked 0.00 packs per day for 0.00 years.  He has never used smokeless tobacco. He reports that he does not drink alcohol or use drugs.   Family History:  The patient's family history includes Bone cancer in his brother; Cancer in his maternal uncle; Congestive Heart Failure in his maternal grandfather; Pancreatitis in his mother; Stroke in his maternal grandfather.    ROS:  Please see the history of present illness.   Otherwise, review of systems are positive for none.   All other systems are reviewed and negative.   PHYSICAL EXAM: VS:  BP (!) 101/57   Pulse 61   Ht 5\' 7"  (1.702 m)   Wt 143 lb 3.2 oz (65 kg)   SpO2 90%   BMI 22.43 kg/m  , BMI Body mass index is 22.43 kg/m. GENERAL:  Well appearing HEENT: Pupils equal round and reactive, fundi not visualized, oral mucosa unremarkable NECK:  No jugular venous distention, waveform within normal limits, carotid upstroke brisk and symmetric, no bruits LUNGS:  Clear to auscultation bilaterally HEART:  RRR.  PMI not displaced or sustained,S1 and S2 within normal limits, no S3, no S4, no clicks, no rubs, no murmurs ABD:  Flat, positive bowel sounds normal in frequency in pitch, no bruits, no rebound, no guarding, no midline pulsatile mass, no hepatomegaly, no splenomegaly EXT:  2 plus pulses throughout, no edema, no cyanosis no clubbing SKIN:  No rashes no nodules NEURO:   Cranial nerves II through XII grossly intact, motor grossly intact throughout PSYCH:  Cognitively intact, oriented to person place and time   EKG:  EKG is not ordered today. The ekg ordered 09/22/17 demonstrates sinus rhythm.  Rate 61 bpm.   03/23/2018: Sinus bradycardia.  Rate 53 bpm.  Echo 09/10/17: Study Conclusions  - Left ventricle: The cavity size was normal. Wall thickness was normal. Systolic function was normal. The estimated ejection fraction was in the range of 55% to 60%. Wall motion was normal; there were no regional wall motion abnormalities. The study is not technically sufficient to allow evaluation of LV diastolic function. - Pulmonary arteries: PA peak pressure: 33 mm Hg (S). - Pericardium, extracardiac: A trivial pericardial effusion was identified.  Impressions:  - Technically difficult; no apical views; normal LV systolic function; trace MR and mild TR.    Recent Labs: 09/12/2017: TSH 0.518 04/26/2018: Magnesium 1.9 05/06/2018: ALT 21; BUN 24; Creatinine, Ser 0.95; Hemoglobin 12.0; Platelets 325.0; Potassium 4.1; Sodium 138   12/23/2017: Total cholesterol 117, triglycerides 107, HDL 59, LDL 37  Lipid Panel No results found for: CHOL, TRIG, HDL, CHOLHDL, VLDL, LDLCALC, LDLDIRECT    Wt Readings from Last 3 Encounters:  07/27/18 143 lb 3.2 oz (65 kg)  07/21/18 143 lb 4.8 oz (65 kg)  07/06/18 142 lb 13.7 oz (64.8 kg)      ASSESSMENT AND PLAN:  # Paroxysmal atrial fibrillation:  Andrew [name] remains in sinus rhythm.  He was asymptomatic in atrial fibrillation.  Given that his blood pressure is low and he has not had any known recurrence we will stop metoprolol.  Continue diltiazem and Eliquis.  # Pulmonary hypertension:  Andrew [name] has been on sildenafil since 2007. PASP was only 33 mmHg on echo 09/10/17. This is odd given that he has significant pulmonary disease. Typically this medication would worsen V/Q mismatch in a patient with  severe COPD.       Current medicines are reviewed at length with the patient today.  The patient does not have concerns regarding medicines.  The following changes have been made:  Stop metoprolol  Labs/ tests ordered today include:  No orders of the defined types were placed in this encounter.    Disposition:   FU with Zelma Mazariego C. Oval Linsey, MD, [facility] in 6 months.     Signed, Ilea Hilton C. Oval Linsey, MD, [facility]  07/27/2018 11:20 AM    [place]

## 2018-07-28 ENCOUNTER — Encounter (HOSPITAL_COMMUNITY)
Admission: RE | Admit: 2018-07-28 | Discharge: 2018-07-28 | Disposition: A | Discharge status: Home / Self Care | Payer: Medicare Other | Source: Ambulatory Visit | Attending: Pulmonary Disease | Admitting: Pulmonary Disease

## 2018-07-28 DIAGNOSIS — J9611 Chronic respiratory failure with hypoxia: Secondary | ICD-10-CM | POA: Diagnosis not present

## 2018-07-28 DIAGNOSIS — J439 Emphysema, unspecified: Secondary | ICD-10-CM

## 2018-07-28 DIAGNOSIS — J438 Other emphysema: Secondary | ICD-10-CM

## 2018-07-28 NOTE — Progress Notes (Signed)
Andrew [name] 77 y.o. male   DOB: 1941/08/10 MRN: [id number]          Nutrition 1. Pulmonary emphysema, unspecified emphysema type ([place])   2. Other emphysema ([place])    Past Medical History:  Diagnosis Date  . Anemia   . BiPAP (biphasic positive airway pressure) dependence    Pt denies history of OSA  . BPH (benign prostatic hyperplasia)   . Cancer ([place])    skin  . COPD (chronic obstructive pulmonary disease) ([place])   . Dysphagia   . Emphysema lung ([place])   . GERD (gastroesophageal reflux disease)    " Silent reflux"  . Hearing loss    right ear  . History of hiatal hernia   . HLD (hyperlipidemia)   . Hypertension   . Oxygen dependent    2-3 liters  . Oxygen dependent   . Pneumonia   . Pulmonary hypertension ([place])   . Sleep apnea    wears cpap     Meds reviewed.  Current Outpatient Medications (Endocrine & Metabolic):  .  predniSONE (DELTASONE) 10 MG tablet, Take 4 tabs daily for 3 days, then 3 tabs daily for 3 days, then 2 tabs daily for 3 days, then continue 1 tab daily maintenance as you were doing before.  Current Outpatient Medications (Cardiovascular):  .  diltiazem (CARDIZEM CD) 180 MG 24 hr capsule, Take 1 capsule (180 mg total) by mouth daily. .  rosuvastatin (CRESTOR) 10 MG tablet, Take 10 mg by mouth daily. .  sildenafil (REVATIO) 20 MG tablet, Take 1 tablet (20 mg total) by mouth 2 (two) times daily.  Current Outpatient Medications (Respiratory):  Marland Kitchen  ADVAIR DISKUS 250-50 MCG/DOSE AEPB, Inhale 1 puff into the lungs 2 (two) times daily. Marland Kitchen  albuterol (PROVENTIL HFA;VENTOLIN HFA) 108 (90 Base) MCG/ACT inhaler, Inhale 2 puffs into the lungs every 6 (six) hours as needed for wheezing or shortness of breath. .  fluticasone (FLONASE) 50 MCG/ACT nasal spray, Place 1 spray into both nostrils at bedtime.  Marland Kitchen  guaiFENesin (MUCINEX) 600 MG 12 hr tablet, Take 1 tablet (600 mg total) by mouth 2 (two) times daily. Marland Kitchen  ipratropium-albuterol (DUONEB) 0.5-2.5 (3) MG/3ML SOLN, Take  3 mLs by nebulization 3 (three) times daily. Marland Kitchen  SPIRIVA HANDIHALER 18 MCG inhalation capsule, INHALE THE CONTENTS OF 1 CAPSULE EVERY DAY   Current Outpatient Medications (Hematological):  .  apixaban (ELIQUIS) 5 MG TABS tablet, Take 1 tablet (5 mg total) by mouth 2 (two) times daily.  Current Outpatient Medications (Other):  .  finasteride (PROSCAR) 5 MG tablet, Take 5 mg by mouth at bedtime.  .  hydrocortisone cream 1 %, Apply 1 application topically daily as needed (Eczema on face and head). Marland Kitchen  liver oil-zinc oxide (DESITIN) 40 % ointment, Apply 1 application topically daily. .  Multiple Vitamins-Minerals (MULTIVITAMIN WITH MINERALS) tablet, Take 1 tablet by mouth daily. Marland Kitchen  omeprazole (PRILOSEC) 40 MG capsule, Take 40 mg by mouth 2 (two) times daily.  .  OXYGEN, Inhale 2-3 L into the lungs See admin instructions. 2L daytime, 3L at night  .  polyethylene glycol (MIRALAX / GLYCOLAX) packet, Take 17 g by mouth daily. (Patient taking differently: Take 17 g by mouth at bedtime. Mix in 8 oz liquid and drink) .  tamsulosin (FLOMAX) 0.4 MG CAPS capsule, Take 1 capsule (0.4 mg total) by mouth daily after breakfast. .  temazepam (RESTORIL) 30 MG capsule, Take 1 capsule (30 mg total) by mouth at bedtime. .  vitamin  C (ASCORBIC ACID) 500 MG tablet, Take 500 mg by mouth 2 (two) times daily.   Ht: Ht Readings from Last 1 Encounters:  07/27/18 5\' 7"  (1.702 m)     Wt:  Wt Readings from Last 3 Encounters:  07/27/18 143 lb 3.2 oz (65 kg)  07/21/18 143 lb 4.8 oz (65 kg)  07/06/18 142 lb 13.7 oz (64.8 kg)     BMI: There is no height or weight on file to calculate BMI.     Current tobacco use? No  Labs:  Lipid Panel  No results found for: CHOL, TRIG, HDL, CHOLHDL, VLDL, LDLCALC, LDLDIRECT  Lab Results  Component Value Date   HGBA1C 6.1 11/25/2016   Note Spoke with pt. Pt is a normal BMI (22.43). Pt eats 3 meals a day, and 2 snacks; most prepared away from home.  Pt is Making healthy food  choices the majority of the time. Pt's Rate Your Plate results reviewed with pt. Pt does not avoid salty food; uses canned/ convenience food.  Pt adds salt to food.  The role of sodium in lung disease reviewed with pt. Educated pt on how to read nutrition labels, Pt expressed understanding of the information reviewed.  Nutrition Diagnosis  Food-and nutrition-related knowledge deficit related to lack of exposure to information as related to diagnosis of pulmonary disease   Nutrition Intervention ? Pt's individual nutrition plan and goals reviewed with pt. ? Benefits of adopting healthy eating habits discussed when pt's Rate Your Plate reviewed.  Goal(s) 1. Pt to identify and limit food sources of sodium. 2. The pt will recognize symptoms that can interfere with adequate oral intake, such as shortness of breath, N/V, early satiety, fatigue, ability to secure and prepare food, taste and smell changes, chewing/swallowing difficulties, and/ or pain when eating.  3. The pt will consume high-energy, high-nutrient dense beverages when necessary to compensate for decreased oral intake of solid foods. 4. Identify food quantities necessary to achieve wt maintenance at graduation from pulmonary rehab. 5. Pt able to name foods that affect blood glucose    Plan:  Pt to attend Pulmonary Nutrition class Will provide client-centered nutrition education as part of interdisciplinary care.    Monitor and Evaluate progress toward nutrition goal with team.   Laurina Bustle, MS, RD, LDN 07/28/2018 12:16 PM

## 2018-07-28 NOTE — Progress Notes (Signed)
Daily Session Note  Patient Details  Name: Andrew [name] MRN: [id number] Date of Birth: 02/24/1941 Referring Provider:     Pulmonary Rehab Walk Test from 07/09/2018 in [place]  Referring Provider  Dr. Lenna Gilford      Encounter Date: 07/28/2018  Check In: Session Check In - 07/28/18 1100      Check-In   Supervising physician immediately available to respond to emergencies  [facility] immediately available    Physician(s)  Dr. Darrick Meigs     Location  [facility]    Staff Present  Rodney Langton, RN;[name], RN, BSN;Ramon Dredge, RN, MHA;Molly DiVincenzo, MS, ACSM RCEP, Exercise Physiologist    Medication changes reported      No    Fall or balance concerns reported     No    Tobacco Cessation  No Change    Warm-up and Cool-down  Performed as group-led instruction    Resistance Training Performed  Yes    VAD Patient?  No    PAD/SET Patient?  No      Pain Assessment   Currently in Pain?  No/denies    Multiple Pain Sites  No       Capillary Blood Glucose: No results found for this or any previous visit (from the past 24 hour(s)).    Social History   Tobacco Use  Smoking Status Former Smoker  . Packs/day: 0.00  . Years: 0.00  . Pack years: 0.00  . Types: Cigarettes  . Last attempt to quit: 10/01/1984  . Years since quitting: 33.8  Smokeless Tobacco Never Used    Goals Met:  Exercise tolerated well No report of cardiac concerns or symptoms Strength training completed today  Goals Unmet:  Not Applicable  Comments: Service time is from 1030 to 1220    Dr. Rush Farmer is Medical Director for Pulmonary Rehab at [facility].

## 2018-07-30 ENCOUNTER — Encounter (HOSPITAL_COMMUNITY)
Admission: RE | Admit: 2018-07-30 | Discharge: 2018-07-30 | Disposition: A | Discharge status: Home / Self Care | Payer: Medicare Other | Source: Ambulatory Visit | Attending: Pulmonary Disease | Admitting: Pulmonary Disease

## 2018-07-30 DIAGNOSIS — J439 Emphysema, unspecified: Secondary | ICD-10-CM | POA: Diagnosis not present

## 2018-07-30 DIAGNOSIS — J438 Other emphysema: Secondary | ICD-10-CM

## 2018-07-30 DIAGNOSIS — J9611 Chronic respiratory failure with hypoxia: Secondary | ICD-10-CM | POA: Diagnosis not present

## 2018-07-30 NOTE — Progress Notes (Signed)
Daily Session Note  Patient Details  Name: Jarnell Cordaro MRN: [id number] Date of Birth: [birth year] Referring Provider:     Pulmonary Rehab Walk Test from 07/09/2018 in [place]  Referring Provider  Dr. Lenna Gilford      Encounter Date: 07/30/2018  Check In: Session Check In - 07/30/18 1226      Check-In   Supervising physician immediately available to respond to emergencies  [facility] immediately available    Physician(s)  Dr. Dyann Kief    Location  [facility]    Staff Present  Maurice Small, RN, BSN;Molly DiVincenzo, MS, ACSM RCEP, Exercise Physiologist;Harry Shuck Ysidro Evert, Felipe Drone, RN, MHA    Medication changes reported      No    Fall or balance concerns reported     No    Tobacco Cessation  No Change    Warm-up and Cool-down  Performed as group-led instruction    Resistance Training Performed  Yes    VAD Patient?  No    PAD/SET Patient?  No      Pain Assessment   Currently in Pain?  No/denies    Pain Score  0-No pain    Multiple Pain Sites  No       Capillary Blood Glucose: No results found for this or any previous visit (from the past 24 hour(s)).    Social History   Tobacco Use  Smoking Status Former Smoker  . Packs/day: 0.00  . Years: 0.00  . Pack years: 0.00  . Types: Cigarettes  . Last attempt to quit: 10/01/1984  . Years since quitting: 33.8  Smokeless Tobacco Never Used    Goals Met:  Exercise tolerated well No report of cardiac concerns or symptoms Strength training completed today  Goals Unmet:  Not Applicable  Comments: Service time is from 1030 to 1205    Dr. Rush Farmer is Medical Director for Pulmonary Rehab at [facility].

## 2018-07-31 NOTE — Progress Notes (Signed)
I have reviewed a Home Exercise Prescription with [name] . Buford is not currently exercising at home.  The patient was advised to walk 2 days a week for 30 minutes.  Marchia Bond and I discussed how to progress their exercise prescription.  The patient stated that their goals were to increase walking time to 45 minutes, get back to routine at [facility].  The patient stated that they understand the exercise prescription.  We reviewed exercise guidelines, target heart rate during exercise, RPE Scale, weather conditions, NTG use, endpoints for exercise, warmup and cool down.  Patient is encouraged to come to me with any questions. I will continue to follow up with the patient to assist them with progression and safety.

## 2018-08-04 ENCOUNTER — Encounter (HOSPITAL_COMMUNITY)
Admission: RE | Admit: 2018-08-04 | Discharge: 2018-08-04 | Disposition: A | Discharge status: Home / Self Care | Payer: Medicare Other | Source: Ambulatory Visit | Attending: Pulmonary Disease | Admitting: Pulmonary Disease

## 2018-08-04 VITALS — Wt 137.1 lb

## 2018-08-04 DIAGNOSIS — J9611 Chronic respiratory failure with hypoxia: Secondary | ICD-10-CM | POA: Diagnosis not present

## 2018-08-04 DIAGNOSIS — J438 Other emphysema: Secondary | ICD-10-CM

## 2018-08-04 DIAGNOSIS — J439 Emphysema, unspecified: Secondary | ICD-10-CM

## 2018-08-04 NOTE — Progress Notes (Signed)
Daily Session Note  Patient Details  Name: Andrew [name] MRN: [id number] Date of Birth: 02-01-1941 Referring Provider:     Pulmonary Rehab Walk Test from 07/09/2018 in [place]  Referring Provider  Dr. Lenna Gilford      Encounter Date: 08/04/2018  Check In: Session Check In - 08/04/18 1242      Check-In   Supervising physician immediately available to respond to emergencies  [facility] immediately available    Physician(s)  Dr. Sloan Leiter    Location  [facility]    Staff Present  Maurice Small, RN, BSN;Molly DiVincenzo, MS, ACSM RCEP, Exercise Physiologist;Cloe Sockwell Ysidro Evert, Felipe Drone, RN, MHA    Medication changes reported      No    Fall or balance concerns reported     No    Tobacco Cessation  No Change    Warm-up and Cool-down  Performed as group-led instruction    Resistance Training Performed  Yes    VAD Patient?  No    PAD/SET Patient?  No      Pain Assessment   Currently in Pain?  No/denies    Multiple Pain Sites  No       Capillary Blood Glucose: No results found for this or any previous visit (from the past 24 hour(s)).  Exercise Prescription Changes - 08/04/18 1300      Response to Exercise   Blood Pressure (Admit)  104/62    Blood Pressure (Exercise)  138/72    Blood Pressure (Exit)  128/76    Heart Rate (Admit)  94 bpm    Heart Rate (Exercise)  113 bpm    Heart Rate (Exit)  90 bpm    Oxygen Saturation (Admit)  98 %    Oxygen Saturation (Exercise)  94 %    Oxygen Saturation (Exit)  99 %    Rating of Perceived Exertion (Exercise)  14    Perceived Dyspnea (Exercise)  3    Duration  Progress to 45 minutes of aerobic exercise without signs/symptoms of physical distress    Intensity  THRR unchanged      Progression   Progression  Continue to progress workloads to maintain intensity without signs/symptoms of physical distress.      Resistance Training   Training Prescription  Yes    Weight   orange bands    Reps  10-15    Time  10 Minutes      Interval Training   Interval Training  No      Treadmill   MPH  1.5    Grade  0    Minutes  17      NuStep   Level  2    SPM  80    Minutes  17    METs  1.6      Arm Ergometer   Level  2    Watts  10    Minutes  17       Social History   Tobacco Use  Smoking Status Former Smoker  . Packs/day: 0.00  . Years: 0.00  . Pack years: 0.00  . Types: Cigarettes  . Last attempt to quit: 10/01/1984  . Years since quitting: 33.8  Smokeless Tobacco Never Used    Goals Met:  Exercise tolerated well No report of cardiac concerns or symptoms Strength training completed today  Goals Unmet:  Not Applicable  Comments: Service time is from 1030 to 1210    Dr. Rush Farmer  is Market researcher for Pulmonary Rehab at [facility].

## 2018-08-05 NOTE — Progress Notes (Addendum)
Pulmonary Individual Treatment Plan  Patient Details  Name: Andrew [name] MRN: [id number] Date of Birth: [birth year] Referring Provider:     Pulmonary Rehab Walk Test from 07/09/2018 in [place]  Referring Provider  Dr. Lenna Gilford      Initial Encounter Date:    Pulmonary Rehab Walk Test from 07/09/2018 in [place]  Date  07/13/18      Visit Diagnosis: Pulmonary emphysema, unspecified emphysema type ([place])  Other emphysema ([place])  Patient's Home Medications on Admission:  Current Outpatient Medications:  .  ADVAIR DISKUS 250-50 MCG/DOSE AEPB, Inhale 1 puff into the lungs 2 (two) times daily., Disp: 60 each, Rfl: 2 .  albuterol (PROVENTIL HFA;VENTOLIN HFA) 108 (90 Base) MCG/ACT inhaler, Inhale 2 puffs into the lungs every 6 (six) hours as needed for wheezing or shortness of breath., Disp: 1 Inhaler, Rfl: 11 .  apixaban (ELIQUIS) 5 MG TABS tablet, Take 1 tablet (5 mg total) by mouth 2 (two) times daily., Disp: 180 tablet, Rfl: 1 .  diltiazem (CARDIZEM CD) 180 MG 24 hr capsule, Take 1 capsule (180 mg total) by mouth daily., Disp: 90 capsule, Rfl: 2 .  finasteride (PROSCAR) 5 MG tablet, Take 5 mg by mouth at bedtime. , Disp: , Rfl:  .  fluticasone (FLONASE) 50 MCG/ACT nasal spray, Place 1 spray into both nostrils at bedtime. , Disp: , Rfl:  .  guaiFENesin (MUCINEX) 600 MG 12 hr tablet, Take 1 tablet (600 mg total) by mouth 2 (two) times daily., Disp: 30 tablet, Rfl: 0 .  hydrocortisone cream 1 %, Apply 1 application topically daily as needed (Eczema on face and head)., Disp: , Rfl:  .  ipratropium-albuterol (DUONEB) 0.5-2.5 (3) MG/3ML SOLN, Take 3 mLs by nebulization 3 (three) times daily., Disp: 360 mL, Rfl: 2 .  liver oil-zinc oxide (DESITIN) 40 % ointment, Apply 1 application topically daily., Disp: , Rfl:  .  Multiple Vitamins-Minerals (MULTIVITAMIN WITH MINERALS) tablet, Take 1 tablet by mouth daily., Disp: , Rfl:  .   omeprazole (PRILOSEC) 40 MG capsule, Take 40 mg by mouth 2 (two) times daily. , Disp: , Rfl:  .  OXYGEN, Inhale 2-3 L into the lungs See admin instructions. 2L daytime, 3L at night , Disp: , Rfl:  .  polyethylene glycol (MIRALAX / GLYCOLAX) packet, Take 17 g by mouth daily. (Patient taking differently: Take 17 g by mouth at bedtime. Mix in 8 oz liquid and drink), Disp: 14 each, Rfl: 1 .  predniSONE (DELTASONE) 10 MG tablet, Take 4 tabs daily for 3 days, then 3 tabs daily for 3 days, then 2 tabs daily for 3 days, then continue 1 tab daily maintenance as you were doing before., Disp: 60 tablet, Rfl: 0 .  rosuvastatin (CRESTOR) 10 MG tablet, Take 10 mg by mouth daily., Disp: , Rfl:  .  sildenafil (REVATIO) 20 MG tablet, Take 1 tablet (20 mg total) by mouth 2 (two) times daily., Disp: 180 tablet, Rfl: 3 .  SPIRIVA HANDIHALER 18 MCG inhalation capsule, INHALE THE CONTENTS OF 1 CAPSULE EVERY DAY, Disp: 90 capsule, Rfl: 3 .  tamsulosin (FLOMAX) 0.4 MG CAPS capsule, Take 1 capsule (0.4 mg total) by mouth daily after breakfast., Disp: 30 capsule, Rfl: 0 .  temazepam (RESTORIL) 30 MG capsule, Take 1 capsule (30 mg total) by mouth at bedtime., Disp: 30 capsule, Rfl: 5 .  vitamin C (ASCORBIC ACID) 500 MG tablet, Take 500 mg by mouth 2 (two) times daily. ,  Disp: , Rfl:   Past Medical History: Past Medical History:  Diagnosis Date  . Anemia   . BiPAP (biphasic positive airway pressure) dependence    Pt denies history of OSA  . BPH (benign prostatic hyperplasia)   . Cancer ([place])    skin  . COPD (chronic obstructive pulmonary disease) ([place])   . Dysphagia   . Emphysema lung ([place])   . GERD (gastroesophageal reflux disease)    " Silent reflux"  . Hearing loss    right ear  . History of hiatal hernia   . HLD (hyperlipidemia)   . Hypertension   . Oxygen dependent    2-3 liters  . Oxygen dependent   . Pneumonia   . Pulmonary hypertension ([place])   . Sleep apnea    wears cpap    Tobacco Use: Social  History   Tobacco Use  Smoking Status Former Smoker  . Packs/day: 0.00  . Years: 0.00  . Pack years: 0.00  . Types: Cigarettes  . Last attempt to quit: 10/01/1984  . Years since quitting: 33.8  Smokeless Tobacco Never Used    Labs: Recent Chemical engineer    Labs for ITP Cardiac and Pulmonary Rehab Latest Ref Rng & Units 01/02/2016 01/10/2016 06/28/2016 11/25/2016 12/07/2016   Hemoglobin A1c 4.6 - 6.5 % 6.2(H) - - 6.1 -   PHART 7.350 - 7.450 - - 7.444 - 7.442   PCO2ART 32.0 - 48.0 mmHg - - 35.2 - 38.8   HCO3 20.0 - 28.0 mmol/L - - 24.1(H) - 26.1   TCO2 0 - 100 mmol/L - 26 25 - -   O2SAT % - - 94.0 - 98.5      Capillary Blood Glucose: Lab Results  Component Value Date   GLUCAP 115 (H) 01/21/2018   GLUCAP 125 (H) 01/21/2018   GLUCAP 132 (H) 01/20/2018   GLUCAP 173 (H) 01/19/2018     Exercise Target Goals: Exercise Program Goal: Individual exercise prescription set using results from initial 6 min walk test and THRR while considering  patient's activity barriers and safety.   Exercise Prescription Goal: Initial exercise prescription builds to 30-45 minutes a day of aerobic activity, 2-3 days per week.  Home exercise guidelines will be given to patient during program as part of exercise prescription that the participant will acknowledge.  Activity Barriers & Risk Stratification: Activity Barriers & Cardiac Risk Stratification - 07/06/18 1405      Activity Barriers & Cardiac Risk Stratification   Activity Barriers  (S) Shortness of Breath;Balance Concerns;None;Neck/Spine Problems   meniere's disease   Cardiac Risk Stratification  Moderate       6 Minute Walk: 6 Minute Walk    Row Name 07/13/18 1006         6 Minute Walk   Phase  Initial     Distance  800 feet     Walk Time  6 minutes     # of Rest Breaks  0     MPH  1.5     METS  2.15     RPE  13     Perceived Dyspnea   3     Symptoms  Yes (comment)     Comments  used wheelchair     Resting HR  69 bpm      Resting BP  120/64     Resting Oxygen Saturation   97 %     Exercise Oxygen Saturation  during 6 min walk  95 %  Max Ex. HR  85 bpm     Max Ex. BP  130/60       Interval HR   1 Minute HR  73     2 Minute HR  81     3 Minute HR  85     4 Minute HR  79     5 Minute HR  85     6 Minute HR  80     2 Minute Post HR  73     Interval Heart Rate?  Yes       Interval Oxygen   Interval Oxygen?  Yes     Baseline Oxygen Saturation %  97 %     1 Minute Oxygen Saturation %  98 %     1 Minute Liters of Oxygen  3 L     2 Minute Oxygen Saturation %  96 %     2 Minute Liters of Oxygen  3 L     3 Minute Oxygen Saturation %  95 %     3 Minute Liters of Oxygen  3 L     4 Minute Oxygen Saturation %  95 %     4 Minute Liters of Oxygen  3 L     5 Minute Oxygen Saturation %  96 %     5 Minute Liters of Oxygen  3 L     6 Minute Oxygen Saturation %  97 %     6 Minute Liters of Oxygen  3 L     2 Minute Post Oxygen Saturation %  98 %     2 Minute Post Liters of Oxygen  3 L        Oxygen Initial Assessment: Oxygen Initial Assessment - 07/13/18 0930      Initial 6 min Walk   Oxygen Used  Continuous;E-Tanks    Liters per minute  3      Program Oxygen Prescription   Program Oxygen Prescription  Continuous;E-Tanks    Liters per minute  3       Oxygen Re-Evaluation: Oxygen Re-Evaluation    Row Name 08/03/18 1624             Program Oxygen Prescription   Program Oxygen Prescription  Continuous;E-Tanks       Liters per minute  3         Home Oxygen   Home Oxygen Device  Portable Concentrator;Home Concentrator       Sleep Oxygen Prescription  Continuous;CPAP       Liters per minute  3       Home Exercise Oxygen Prescription  Continuous       Liters per minute  3       Home at Rest Exercise Oxygen Prescription  Continuous       Liters per minute  2       Compliance with Home Oxygen Use  Yes         Goals/Expected Outcomes   Short Term Goals  To learn and exhibit compliance with  exercise, home and travel O2 prescription;To learn and understand importance of monitoring SPO2 with pulse oximeter and demonstrate accurate use of the pulse oximeter.;To learn and understand importance of maintaining oxygen saturations>88%;To learn and demonstrate proper pursed lip breathing techniques or other breathing techniques.;To learn and demonstrate proper use of respiratory medications       Long  Term Goals  Exhibits compliance with exercise, home and travel O2 prescription;Verbalizes importance of  monitoring SPO2 with pulse oximeter and return demonstration;Maintenance of O2 saturations>88%;Exhibits proper breathing techniques, such as pursed lip breathing or other method taught during program session;Compliance with respiratory medication;Demonstrates proper use of MDI's       Goals/Expected Outcomes  compliance          Oxygen Discharge (Final Oxygen Re-Evaluation): Oxygen Re-Evaluation - 08/03/18 1624      Program Oxygen Prescription   Program Oxygen Prescription  Continuous;E-Tanks    Liters per minute  3      Home Oxygen   Home Oxygen Device  Portable Concentrator;Home Concentrator    Sleep Oxygen Prescription  Continuous;CPAP    Liters per minute  3    Home Exercise Oxygen Prescription  Continuous    Liters per minute  3    Home at Rest Exercise Oxygen Prescription  Continuous    Liters per minute  2    Compliance with Home Oxygen Use  Yes      Goals/Expected Outcomes   Short Term Goals  To learn and exhibit compliance with exercise, home and travel O2 prescription;To learn and understand importance of monitoring SPO2 with pulse oximeter and demonstrate accurate use of the pulse oximeter.;To learn and understand importance of maintaining oxygen saturations>88%;To learn and demonstrate proper pursed lip breathing techniques or other breathing techniques.;To learn and demonstrate proper use of respiratory medications    Long  Term Goals  Exhibits compliance with exercise,  home and travel O2 prescription;Verbalizes importance of monitoring SPO2 with pulse oximeter and return demonstration;Maintenance of O2 saturations>88%;Exhibits proper breathing techniques, such as pursed lip breathing or other method taught during program session;Compliance with respiratory medication;Demonstrates proper use of MDI's    Goals/Expected Outcomes  compliance       Initial Exercise Prescription: Initial Exercise Prescription - 07/13/18 1000      Date of Initial Exercise RX and Referring Provider   Date  07/13/18    Referring Provider  Dr. Lenna Gilford      Oxygen   Oxygen  Continuous    Liters  3      NuStep   Level  2    SPM  80    Minutes  17    METs  1.5      Arm Ergometer   Level  2    Watts  10    Minutes  17      Track   Laps  5    Minutes  17      Prescription Details   Frequency (times per week)  2    Duration  Progress to 45 minutes of aerobic exercise without signs/symptoms of physical distress      Intensity   THRR 40-80% of Max Heartrate  57-114    Ratings of Perceived Exertion  11-13    Perceived Dyspnea  0-4      Progression   Progression  Continue progressive overload as per policy without signs/symptoms or physical distress.      Resistance Training   Training Prescription  Yes    Weight  blue bands    Reps  10-15       Perform Capillary Blood Glucose checks as needed.  Exercise Prescription Changes: Exercise Prescription Changes    Row Name 07/21/18 1200 07/30/18 1003 08/04/18 1300         Response to Exercise   Blood Pressure (Admit)  112/62  -  104/62     Blood Pressure (Exercise)  120/56  -  138/72  Blood Pressure (Exit)  102/58  -  128/76     Heart Rate (Admit)  54 bpm  -  94 bpm     Heart Rate (Exercise)  74 bpm  -  113 bpm     Heart Rate (Exit)  57 bpm  -  90 bpm     Oxygen Saturation (Admit)  99 %  -  98 %     Oxygen Saturation (Exercise)  97 %  -  94 %     Oxygen Saturation (Exit)  100 %  -  99 %     Rating of  Perceived Exertion (Exercise)  12  -  14     Perceived Dyspnea (Exercise)  2  -  3     Duration  Progress to 45 minutes of aerobic exercise without signs/symptoms of physical distress  -  Progress to 45 minutes of aerobic exercise without signs/symptoms of physical distress     Intensity  THRR unchanged  -  THRR unchanged       Progression   Progression  Continue to progress workloads to maintain intensity without signs/symptoms of physical distress.  -  Continue to progress workloads to maintain intensity without signs/symptoms of physical distress.       Resistance Training   Training Prescription  Yes  -  Yes     Weight  blue bands  -  orange bands     Reps  10-15  -  10-15     Time  -  -  10 Minutes       Interval Training   Interval Training  -  -  No       Oxygen   Oxygen  Continuous  -  -     Liters  3  -  -       Treadmill   MPH  1.3  -  1.5     Grade  0  -  0     Minutes  17  -  17       NuStep   Level  2  -  2     SPM  80  -  80     Minutes  17  -  17     METs  1.6  -  1.6       Arm Ergometer   Level  2  -  2     Watts  10  -  10     Minutes  17  -  17       Home Exercise Plan   Plans to continue exercise at  -  Home (comment)  -     Frequency  -  Add 2 additional days to program exercise sessions.  -        Exercise Comments: Exercise Comments    Row Name 07/31/18 1003           Exercise Comments  Home exercise completed          Exercise Goals and Review: Exercise Goals    [place] Name 07/06/18 1406             Exercise Goals   Increase Physical Activity  Yes       Intervention  Provide advice, education, support and counseling about physical activity/exercise needs.;Develop an individualized exercise prescription for aerobic and resistive training based on initial evaluation findings, risk stratification, comorbidities and participant's personal goals.  Expected Outcomes  Short Term: Attend rehab on a regular basis to increase amount of  physical activity.;Long Term: Add in home exercise to make exercise part of routine and to increase amount of physical activity.;Long Term: Exercising regularly at least 3-5 days a week.       Increase Strength and Stamina  Yes       Intervention  Provide advice, education, support and counseling about physical activity/exercise needs.;Develop an individualized exercise prescription for aerobic and resistive training based on initial evaluation findings, risk stratification, comorbidities and participant's personal goals.       Expected Outcomes  Short Term: Increase workloads from initial exercise prescription for resistance, speed, and METs.;Short Term: Perform resistance training exercises routinely during rehab and add in resistance training at home;Long Term: Improve cardiorespiratory fitness, muscular endurance and strength as measured by increased METs and functional capacity (6MWT)       Able to understand and use rate of perceived exertion (RPE) scale  Yes       Intervention  Provide education and explanation on how to use RPE scale       Expected Outcomes  Short Term: Able to use RPE daily in rehab to express subjective intensity level;Long Term:  Able to use RPE to guide intensity level when exercising independently       Able to understand and use Dyspnea scale  Yes       Intervention  Provide education and explanation on how to use Dyspnea scale       Expected Outcomes  Short Term: Able to use Dyspnea scale daily in rehab to express subjective sense of shortness of breath during exertion;Long Term: Able to use Dyspnea scale to guide intensity level when exercising independently       Knowledge and understanding of Target Heart Rate Range (THRR)  Yes       Intervention  Provide education and explanation of THRR including how the numbers were predicted and where they are located for reference       Expected Outcomes  Short Term: Able to state/look up THRR;Short Term: Able to use daily as  guideline for intensity in rehab;Long Term: Able to use THRR to govern intensity when exercising independently       Understanding of Exercise Prescription  Yes       Intervention  Provide education, explanation, and written materials on patient's individual exercise prescription       Expected Outcomes  Short Term: Able to explain program exercise prescription;Long Term: Able to explain home exercise prescription to exercise independently          Exercise Goals Re-Evaluation : Exercise Goals Re-Evaluation    Row Name 08/03/18 1625             Exercise Goal Re-Evaluation   Exercise Goals Review  Increase Physical Activity;Increase Strength and Stamina;Able to understand and use rate of perceived exertion (RPE) scale;Able to understand and use Dyspnea scale;Knowledge and understanding of Target Heart Rate Range (THRR);Understanding of Exercise Prescription       Comments  Patient is making slow progress. Can be gaurded at times with how hard he pushes himself. MET average places him in a lower level. Is motivated to make changes. Home exercise was discussed.. Patient wants to get back to walking 45 minutes at a time. Will cont. to monitor and motivate as able.        Expected Outcomes  Through exercise at rehab and at home, the patient will decrease shortness of  breath with daily activities and feel confident in carrying out an exercise regime at home.           Discharge Exercise Prescription (Final Exercise Prescription Changes): Exercise Prescription Changes - 08/04/18 1300      Response to Exercise   Blood Pressure (Admit)  104/62    Blood Pressure (Exercise)  138/72    Blood Pressure (Exit)  128/76    Heart Rate (Admit)  94 bpm    Heart Rate (Exercise)  113 bpm    Heart Rate (Exit)  90 bpm    Oxygen Saturation (Admit)  98 %    Oxygen Saturation (Exercise)  94 %    Oxygen Saturation (Exit)  99 %    Rating of Perceived Exertion (Exercise)  14    Perceived Dyspnea (Exercise)  3     Duration  Progress to 45 minutes of aerobic exercise without signs/symptoms of physical distress    Intensity  THRR unchanged      Progression   Progression  Continue to progress workloads to maintain intensity without signs/symptoms of physical distress.      Resistance Training   Training Prescription  Yes    Weight  orange bands    Reps  10-15    Time  10 Minutes      Interval Training   Interval Training  No      Treadmill   MPH  1.5    Grade  0    Minutes  17      NuStep   Level  2    SPM  80    Minutes  17    METs  1.6      Arm Ergometer   Level  2    Watts  10    Minutes  17       Nutrition:  Target Goals: Understanding of nutrition guidelines, daily intake of sodium <1540m, cholesterol <2067m calories 30% from fat and 7% or less from saturated fats, daily to have 5 or more servings of fruits and vegetables.  Biometrics:    Nutrition Therapy Plan and Nutrition Goals: Nutrition Therapy & Goals - 07/07/18 0830      Nutrition Therapy   Diet  general healthful      Personal Nutrition Goals   Nutrition Goal  identify and limit dietary sources of sodium    Personal Goal #2  Identify food quantities necessary to achieve wt maintenance at graduation from pulmonary rehab      Intervention Plan   Intervention  Prescribe, educate and counsel regarding individualized specific dietary modifications aiming towards targeted core components such as weight, hypertension, lipid management, diabetes, heart failure and other comorbidities.    Expected Outcomes  Short Term Goal: Understand basic principles of dietary content, such as calories, fat, sodium, cholesterol and nutrients.       Nutrition Assessments: Nutrition Assessments - 07/07/18 0827      Rate Your Plate Scores   Pre Score  51       Nutrition Goals Re-Evaluation: Nutrition Goals Re-Evaluation    [place] 07/07/18 0827 07/07/18 0830           Goals   Current Weight  142 lb 13.7 oz (64.8 kg)   142 lb 13.7 oz (64.8 kg)      Nutrition Goal  identify and limit dietary sources of sodium  -      Comment  -  Pt wt is down 2 lb since admission  Nutrition Goals Re-Evaluation: Nutrition Goals Re-Evaluation    [place] Name 07/07/18 0827 07/07/18 0830           Goals   Current Weight  142 lb 13.7 oz (64.8 kg)  142 lb 13.7 oz (64.8 kg)      Nutrition Goal  identify and limit dietary sources of sodium  -      Comment  -  Pt wt is down 2 lb since admission         Nutrition Goals Discharge (Final Nutrition Goals Re-Evaluation): Nutrition Goals Re-Evaluation - 07/07/18 0830      Goals   Current Weight  142 lb 13.7 oz (64.8 kg)    Comment  Pt wt is down 2 lb since admission       Psychosocial: Target Goals: Acknowledge presence or absence of significant depression and/or stress, maximize coping skills, provide positive support system. Participant is able to verbalize types and ability to use techniques and skills needed for reducing stress and depression.  Initial Review & Psychosocial Screening: Initial Psych Review & Screening - 07/06/18 1411      Initial Review   Current issues with  None Identified      Family Dynamics   Good Support System?  Yes    Comments  lives with daughter who is very supportive      Barriers   Psychosocial barriers to participate in program  There are no identifiable barriers or psychosocial needs.      Screening Interventions   Interventions  Encouraged to exercise    Expected Outcomes  Short Term goal: Identification and review with participant of any Quality of Life or Depression concerns found by scoring the questionnaire.;Long Term goal: The participant improves quality of Life and PHQ9 Scores as seen by post scores and/or verbalization of changes       Quality of Life Scores:  Scores of 19 and below usually indicate a poorer quality of life in these areas.  A difference of  2-3 points is a clinically meaningful difference.  A  difference of 2-3 points in the total score of the Quality of Life Index has been associated with significant improvement in overall quality of life, self-image, physical symptoms, and general health in studies assessing change in quality of life.  PHQ-9: Recent Review Flowsheet Data    Depression screen [facility] 2/9 07/06/2018 08/15/2016 06/20/2016 04/29/2016   Decreased Interest 0 0 0 0   Down, Depressed, Hopeless 0 0 0 0   PHQ - 2 Score 0 0 0 0   Altered sleeping 1 - - -   Tired, decreased energy 0 - - -   Change in appetite 0 - - -   Feeling bad or failure about yourself  0 - - -   Trouble concentrating 0 - - -   Moving slowly or fidgety/restless 0 - - -   Suicidal thoughts 0 - - -   PHQ-9 Score 1 - - -   Difficult doing work/chores Not difficult at all - - -     Interpretation of Total Score  Total Score Depression Severity:  1-4 = Minimal depression, 5-9 = Mild depression, 10-14 = Moderate depression, 15-19 = Moderately severe depression, 20-27 = Severe depression   Psychosocial Evaluation and Intervention: Psychosocial Evaluation - 08/05/18 1208      Psychosocial Evaluation & Interventions   Interventions  Encouraged to exercise with the program and follow exercise prescription    Comments  none identified, pt is delighful  and interacts well with other participants    Expected Outcomes  Pt will continue to display positve and healthy coping skills.      Continue Psychosocial Services   Follow up required by staff       Psychosocial Re-Evaluation: Psychosocial Re-Evaluation    [place] Name 08/05/18 1209             Psychosocial Re-Evaluation   Current issues with  None Identified       Comments  no psychosocial barriers identified        Expected Outcomes  Pt will continue to display mental well being        Interventions  Stress management education;Physician referral;Encouraged to attend Pulmonary Rehabilitation for the exercise          Psychosocial Discharge (Final  Psychosocial Re-Evaluation): Psychosocial Re-Evaluation - 08/05/18 1209      Psychosocial Re-Evaluation   Current issues with  None Identified    Comments  no psychosocial barriers identified     Expected Outcomes  Pt will continue to display mental well being     Interventions  Stress management education;Physician referral;Encouraged to attend Pulmonary Rehabilitation for the exercise       Vocational Rehabilitation: Provide vocational rehab assistance to qualifying candidates.   Vocational Rehab Evaluation & Intervention: Vocational Rehab - 07/06/18 1415      Initial Vocational Rehab Evaluation & Intervention   Assessment shows need for Vocational Rehabilitation  No       Education: Education Goals: Education classes will be provided on a weekly basis, covering required topics. Participant will state understanding/return demonstration of topics presented.  Learning Barriers/Preferences: Learning Barriers/Preferences - 07/06/18 1412      Learning Barriers/Preferences   Learning Barriers  Sight;Hearing    Learning Preferences  Written Material;Skilled Demonstration       Education Topics: Count Your Pulse:  -Group instruction provided by verbal instruction, demonstration, patient participation and written materials to support subject.  Instructors address importance of being able to find your pulse and how to count your pulse when at home without a heart monitor.  Patients get hands on experience counting their pulse with staff help and individually.   Heart Attack, Angina, and Risk Factor Modification:  -Group instruction provided by verbal instruction, video, and written materials to support subject.  Instructors address signs and symptoms of angina and heart attacks.    Also discuss risk factors for heart disease and how to make changes to improve heart health risk factors.   Functional Fitness:  -Group instruction provided by verbal instruction, demonstration, patient  participation, and written materials to support subject.  Instructors address safety measures for doing things around the house.  Discuss how to get up and down off the floor, how to pick things up properly, how to safely get out of a chair without assistance, and balance training.   Meditation and Mindfulness:  -Group instruction provided by verbal instruction, patient participation, and written materials to support subject.  Instructor addresses importance of mindfulness and meditation practice to help reduce stress and improve awareness.  Instructor also leads participants through a meditation exercise.    Stretching for Flexibility and Mobility:  -Group instruction provided by verbal instruction, patient participation, and written materials to support subject.  Instructors lead participants through series of stretches that are designed to increase flexibility thus improving mobility.  These stretches are additional exercise for major muscle groups that are typically performed during regular warm up and cool down.   Hands  Only CPR:  -Group verbal, video, and participation provides a basic overview of AHA guidelines for community CPR. Role-play of emergencies allow participants the opportunity to practice calling for help and chest compression technique with discussion of AED use.   Hypertension: -Group verbal and written instruction that provides a basic overview of hypertension including the most recent diagnostic guidelines, risk factor reduction with self-care instructions and medication management.    Nutrition I class: Heart Healthy Eating:  -Group instruction provided by PowerPoint slides, verbal discussion, and written materials to support subject matter. The instructor gives an explanation and review of the Therapeutic Lifestyle Changes diet recommendations, which includes a discussion on lipid goals, dietary fat, sodium, fiber, plant stanol/sterol esters, sugar, and the components of  a well-balanced, healthy diet.   Nutrition II class: Lifestyle Skills:  -Group instruction provided by PowerPoint slides, verbal discussion, and written materials to support subject matter. The instructor gives an explanation and review of label reading, grocery shopping for heart health, heart healthy recipe modifications, and ways to make healthier choices when eating out.   Diabetes Question & Answer:  -Group instruction provided by PowerPoint slides, verbal discussion, and written materials to support subject matter. The instructor gives an explanation and review of diabetes co-morbidities, pre- and post-prandial blood glucose goals, pre-exercise blood glucose goals, signs, symptoms, and treatment of hypoglycemia and hyperglycemia, and foot care basics.   Diabetes Blitz:  -Group instruction provided by PowerPoint slides, verbal discussion, and written materials to support subject matter. The instructor gives an explanation and review of the physiology behind type 1 and type 2 diabetes, diabetes medications and rational behind using different medications, pre- and post-prandial blood glucose recommendations and Hemoglobin A1c goals, diabetes diet, and exercise including blood glucose guidelines for exercising safely.    Portion Distortion:  -Group instruction provided by PowerPoint slides, verbal discussion, written materials, and food models to support subject matter. The instructor gives an explanation of serving size versus portion size, changes in portions sizes over the last 20 years, and what consists of a serving from each food group.   Stress Management:  -Group instruction provided by verbal instruction, video, and written materials to support subject matter.  Instructors review role of stress in heart disease and how to cope with stress positively.     PULMONARY REHAB OTHER RESPIRATORY from 08/08/2016 in [place]  Date  08/01/16  Educator  video   Instruction Review Code (Retired)  2- meets goals/outcomes      Exercising on Your Own:  -Group instruction provided by verbal instruction, power point, and written materials to support subject.  Instructors discuss benefits of exercise, components of exercise, frequency and intensity of exercise, and end points for exercise.  Also discuss use of nitroglycerin and activating EMS.  Review options of places to exercise outside of rehab.  Review guidelines for sex with heart disease.   Cardiac Drugs I:  -Group instruction provided by verbal instruction and written materials to support subject.  Instructor reviews cardiac drug classes: antiplatelets, anticoagulants, beta blockers, and statins.  Instructor discusses reasons, side effects, and lifestyle considerations for each drug class.   Cardiac Drugs II:  -Group instruction provided by verbal instruction and written materials to support subject.  Instructor reviews cardiac drug classes: angiotensin converting enzyme inhibitors (ACE-I), angiotensin II receptor blockers (ARBs), nitrates, and calcium channel blockers.  Instructor discusses reasons, side effects, and lifestyle considerations for each drug class.   Anatomy and Physiology of the Circulatory System:  Group verbal and written instruction and models provide basic cardiac anatomy and physiology, with the coronary electrical and arterial systems. Review of: AMI, Angina, Valve disease, Heart Failure, Peripheral Artery Disease, Cardiac Arrhythmia, Pacemakers, and the ICD.   Other Education:  -Group or individual verbal, written, or video instructions that support the educational goals of the cardiac rehab program.   Holiday Eating Survival Tips:  -Group instruction provided by PowerPoint slides, verbal discussion, and written materials to support subject matter. The instructor gives patients tips, tricks, and techniques to help them not only survive but enjoy the holidays despite the  onslaught of food that accompanies the holidays.   Knowledge Questionnaire Score: Knowledge Questionnaire Score - 07/08/18 1514      Knowledge Questionnaire Score   Pre Score  16/18       Core Components/Risk Factors/Patient Goals at Admission: Personal Goals and Risk Factors at Admission - 07/06/18 1415      Core Components/Risk Factors/Patient Goals on Admission    Weight Management  Yes;Weight Maintenance    Admit Weight  142 lb 13.7 oz (64.8 kg)    Goal Weight: Long Term  140 lb (63.5 kg)    Expected Outcomes  Understanding of distribution of calorie intake throughout the day with the consumption of 4-5 meals/snacks;Understanding recommendations for meals to include 15-35% energy as protein, 25-35% energy from fat, 35-60% energy from carbohydrates, less than 259m of dietary cholesterol, 20-35 gm of total fiber daily;Weight Maintenance: Understanding of the daily nutrition guidelines, which includes 25-35% calories from fat, 7% or less cal from saturated fats, less than 2012mcholesterol, less than 1.5gm of sodium, & 5 or more servings of fruits and vegetables daily;Short Term: Continue to assess and modify interventions until short term weight is achieved;Long Term: Adherence to nutrition and physical activity/exercise program aimed toward attainment of established weight goal    Improve shortness of breath with ADL's  Yes    Intervention  Provide education, individualized exercise plan and daily activity instruction to help decrease symptoms of SOB with activities of daily living.    Expected Outcomes  Short Term: Improve cardiorespiratory fitness to achieve a reduction of symptoms when performing ADLs;Long Term: Be able to perform more ADLs without symptoms or delay the onset of symptoms       Core Components/Risk Factors/Patient Goals Review:  Goals and Risk Factor Review    Row Name 08/05/18 1210             Core Components/Risk Factors/Patient Goals Review   Personal Goals  Review  Weight Management/Obesity;Develop more efficient breathing techniques such as purse lipped breathing and diaphragmatic breathing and practicing self-pacing with activity.;Increase knowledge of respiratory medications and ability to use respiratory devices properly.;Improve shortness of breath with ADL's       Review  Pt who is well known by rehab staff for pulmonary and cardiac rehab, is off to a great start.  Pt has completed 6 exercise sessions.  Pt has attended education on respiratory medications. Pt consistently reporrts level 2 for Dyspnea. Pt demonstrates appropriate technique for PLB and  self pacing.  Pt exercise  wtih level 2.0 on the bike, Treadmill 1.5/0 and nustep level 2.  Anticiapte that pt will show progress in workloads in the next 30 day assessnent.       Expected Outcomes  See Admission Goals/Outcomes          Core Components/Risk Factors/Patient Goals at Discharge (Final Review):  Goals and Risk Factor Review - 08/05/18 1210  Core Components/Risk Factors/Patient Goals Review   Personal Goals Review  Weight Management/Obesity;Develop more efficient breathing techniques such as purse lipped breathing and diaphragmatic breathing and practicing self-pacing with activity.;Increase knowledge of respiratory medications and ability to use respiratory devices properly.;Improve shortness of breath with ADL's    Review  Pt who is well known by rehab staff for pulmonary and cardiac rehab, is off to a great start.  Pt has completed 6 exercise sessions.  Pt has attended education on respiratory medications. Pt consistently reporrts level 2 for Dyspnea. Pt demonstrates appropriate technique for PLB and  self pacing.  Pt exercise  wtih level 2.0 on the bike, Treadmill 1.5/0 and nustep level 2.  Anticiapte that pt will show progress in workloads in the next 30 day assessnent.    Expected Outcomes  See Admission Goals/Outcomes       ITP Comments: ITP Comments    Row Name 07/06/18 1350  08/05/18 1208         ITP Comments  Dr. Jennet Maduro, Medical Director  Dr. Jennet Maduro, Medical Director Pulmonary Rehab         Comments: Pt has completed 6 exercise sessions. Continue to monitor pt progress. Cherre Huger, BSN Cardiac and Training and development officer

## 2018-08-06 ENCOUNTER — Encounter (HOSPITAL_COMMUNITY)
Admission: RE | Admit: 2018-08-06 | Discharge: 2018-08-06 | Disposition: A | Discharge status: Home / Self Care | Payer: Medicare Other | Source: Ambulatory Visit | Attending: Pulmonary Disease | Admitting: Pulmonary Disease

## 2018-08-06 DIAGNOSIS — J439 Emphysema, unspecified: Secondary | ICD-10-CM

## 2018-08-06 DIAGNOSIS — J438 Other emphysema: Secondary | ICD-10-CM

## 2018-08-06 DIAGNOSIS — J9611 Chronic respiratory failure with hypoxia: Secondary | ICD-10-CM | POA: Diagnosis not present

## 2018-08-06 NOTE — Progress Notes (Signed)
Daily Session Note  Patient Details  Name: Andrew [name] MRN: [id number] Date of Birth: 1941-08-24 Referring Provider:     Pulmonary Rehab Walk Test from 07/09/2018 in [place]  Referring Provider  Dr. Lenna Gilford      Encounter Date: 08/06/2018  Check In: Session Check In - 08/06/18 1232      Check-In   Supervising physician immediately available to respond to emergencies  [facility] immediately available    Physician(s)  Dr. Toma Copier    Location  [facility]    Staff Present  Maurice Small, RN, BSN;Molly DiVincenzo, MS, ACSM RCEP, Exercise Physiologist;Corvette Orser Ysidro Evert, Felipe Drone, RN, MHA    Medication changes reported      No    Fall or balance concerns reported     No    Tobacco Cessation  No Change    Warm-up and Cool-down  Performed as group-led instruction    Resistance Training Performed  Yes    VAD Patient?  No    PAD/SET Patient?  No      Pain Assessment   Currently in Pain?  No/denies    Pain Score  0-No pain    Multiple Pain Sites  No       Capillary Blood Glucose: No results found for this or any previous visit (from the past 24 hour(s)).    Social History   Tobacco Use  Smoking Status Former Smoker  . Packs/day: 0.00  . Years: 0.00  . Pack years: 0.00  . Types: Cigarettes  . Last attempt to quit: 10/01/1984  . Years since quitting: 33.8  Smokeless Tobacco Never Used    Goals Met:  Exercise tolerated well No report of cardiac concerns or symptoms Strength training completed today  Goals Unmet:  Not Applicable  Comments: Service time is from 1030 to 1220    Dr. Rush Farmer is Medical Director for Pulmonary Rehab at [facility].

## 2018-08-08 DIAGNOSIS — Z23 Encounter for immunization: Secondary | ICD-10-CM | POA: Diagnosis not present

## 2018-08-11 ENCOUNTER — Encounter (HOSPITAL_COMMUNITY)
Admission: RE | Admit: 2018-08-11 | Discharge: 2018-08-11 | Disposition: A | Discharge status: Home / Self Care | Payer: Medicare Other | Source: Ambulatory Visit | Attending: Pulmonary Disease | Admitting: Pulmonary Disease

## 2018-08-11 DIAGNOSIS — J438 Other emphysema: Secondary | ICD-10-CM

## 2018-08-11 DIAGNOSIS — J9611 Chronic respiratory failure with hypoxia: Secondary | ICD-10-CM | POA: Diagnosis not present

## 2018-08-11 DIAGNOSIS — J439 Emphysema, unspecified: Secondary | ICD-10-CM

## 2018-08-11 NOTE — Progress Notes (Signed)
Daily Session Note  Patient Details  Name: Jermichael Belmares MRN: [id number] Date of Birth: 1941-03-13 Referring Provider:     Pulmonary Rehab Walk Test from 07/09/2018 in [place]  Referring Provider  Dr. Lenna Gilford      Encounter Date: 08/11/2018  Check In: Session Check In - 08/11/18 1023      Check-In   Supervising physician immediately available to respond to emergencies  [facility] immediately available    Physician(s)  Dr. Loleta Books    Location  [facility]    Staff Present  Maurice Small, RN, BSN;Gaelen Brager, MS, ACSM RCEP, Exercise Physiologist;Annedrea Stackhouse, RN, [place]    Medication changes reported      No    Fall or balance concerns reported     No    Tobacco Cessation  No Change    Warm-up and Cool-down  Performed as group-led instruction    Resistance Training Performed  Yes    VAD Patient?  No    PAD/SET Patient?  No      Pain Assessment   Currently in Pain?  No/denies    Multiple Pain Sites  No       Capillary Blood Glucose: No results found for this or any previous visit (from the past 24 hour(s)).    Social History   Tobacco Use  Smoking Status Former Smoker  . Packs/day: 0.00  . Years: 0.00  . Pack years: 0.00  . Types: Cigarettes  . Last attempt to quit: 10/01/1984  . Years since quitting: 33.8  Smokeless Tobacco Never Used    Goals Met:  Exercise tolerated well  Goals Unmet:  Not Applicable  Comments: Service time is from 10:30a to 12:30p    Dr. Rush Farmer is Medical Director for Pulmonary Rehab at [facility].

## 2018-08-13 ENCOUNTER — Encounter (HOSPITAL_COMMUNITY)
Admission: RE | Admit: 2018-08-13 | Discharge: 2018-08-13 | Disposition: A | Discharge status: Home / Self Care | Payer: Medicare Other | Source: Ambulatory Visit | Attending: Pulmonary Disease | Admitting: Pulmonary Disease

## 2018-08-13 DIAGNOSIS — J9611 Chronic respiratory failure with hypoxia: Secondary | ICD-10-CM | POA: Diagnosis not present

## 2018-08-13 DIAGNOSIS — J438 Other emphysema: Secondary | ICD-10-CM

## 2018-08-13 DIAGNOSIS — J439 Emphysema, unspecified: Secondary | ICD-10-CM | POA: Diagnosis not present

## 2018-08-13 NOTE — Progress Notes (Signed)
Daily Session Note  Patient Details  Name: Andrew [name] MRN: [id number] Date of Birth: 09-06-1941 Referring Provider:     Pulmonary Rehab Walk Test from 07/09/2018 in [place]  Referring Provider  Dr. Lenna Gilford      Encounter Date: 08/13/2018  Check In: Session Check In - 08/13/18 1010      Check-In   Supervising physician immediately available to respond to emergencies  [facility] immediately available    Physician(s)  Dr. Loleta Books    Location  [facility]    Staff Present  Maurice Small, RN, BSN;Diedre Maclellan, MS, ACSM RCEP, Exercise Physiologist;Annedrea Stackhouse, RN, [place]    Medication changes reported      No    Fall or balance concerns reported     No    Tobacco Cessation  No Change    Warm-up and Cool-down  Performed as group-led instruction    Resistance Training Performed  Yes    VAD Patient?  No    PAD/SET Patient?  No      Pain Assessment   Currently in Pain?  No/denies    Multiple Pain Sites  No       Capillary Blood Glucose: No results found for this or any previous visit (from the past 24 hour(s)).    Social History   Tobacco Use  Smoking Status Former Smoker  . Packs/day: 0.00  . Years: 0.00  . Pack years: 0.00  . Types: Cigarettes  . Last attempt to quit: 10/01/1984  . Years since quitting: 33.8  Smokeless Tobacco Never Used    Goals Met:  Exercise tolerated well  Goals Unmet:  Not Applicable  Comments: Service time is from 10:30a to 12:30p    Dr. Rush Farmer is Medical Director for Pulmonary Rehab at [facility].

## 2018-08-18 ENCOUNTER — Encounter (HOSPITAL_COMMUNITY)
Admission: RE | Admit: 2018-08-18 | Discharge: 2018-08-18 | Disposition: A | Discharge status: Home / Self Care | Payer: Medicare Other | Source: Ambulatory Visit | Attending: Pulmonary Disease | Admitting: Pulmonary Disease

## 2018-08-18 VITALS — Wt 143.7 lb

## 2018-08-18 DIAGNOSIS — J439 Emphysema, unspecified: Secondary | ICD-10-CM | POA: Insufficient documentation

## 2018-08-18 DIAGNOSIS — J9611 Chronic respiratory failure with hypoxia: Secondary | ICD-10-CM | POA: Diagnosis not present

## 2018-08-18 DIAGNOSIS — J438 Other emphysema: Secondary | ICD-10-CM

## 2018-08-18 NOTE — Progress Notes (Signed)
While exercising at Pulmonary Rehab, Andrew [name] consistently uses 2 liters of continuous flow oxygen. This is provided by Pulmonary Rehab [employer]. At home, the patient currently uses POC for their home oxygen system. While exercising at home, the patient was instructed to use 2 liters pulsed. In Pulmonary Rehab today, Andrew [name] walked the track utilizing their own home oxygen system. They used  liters 3L pulsed which maintained their oxygen saturation above 90%.

## 2018-08-18 NOTE — Progress Notes (Signed)
Daily Session Note  Patient Details  Name: Andrew [name] MRN: [id number] Date of Birth: 10-12-1941 Referring Provider:     Pulmonary Rehab Walk Test from 07/09/2018 in [place]  Referring Provider  Dr. Lenna Gilford      Encounter Date: 08/18/2018  Check In: Session Check In - 08/18/18 1242      Check-In   Supervising physician immediately available to respond to emergencies  [facility] immediately available    Physician(s)  Dr. Louanne Belton    Location  [facility]    Staff Present  Maurice Small, RN, BSN;Molly DiVincenzo, MS, ACSM RCEP, Exercise Physiologist;Gordana Kewley Ysidro Evert, Felipe Drone, RN, MHA    Medication changes reported      No    Fall or balance concerns reported     No    Tobacco Cessation  No Change    Warm-up and Cool-down  Performed as group-led instruction    Resistance Training Performed  Yes    VAD Patient?  No    PAD/SET Patient?  No      Pain Assessment   Currently in Pain?  No/denies    Pain Score  0-No pain    Multiple Pain Sites  No       Capillary Blood Glucose: No results found for this or any previous visit (from the past 24 hour(s)).  Exercise Prescription Changes - 08/18/18 1500      Response to Exercise   Blood Pressure (Admit)  134/70    Blood Pressure (Exercise)  130/72    Blood Pressure (Exit)  122/56    Heart Rate (Admit)  76 bpm    Heart Rate (Exercise)  126 bpm    Heart Rate (Exit)  76 bpm    Oxygen Saturation (Admit)  98 %    Oxygen Saturation (Exercise)  87 %    Oxygen Saturation (Exit)  99 %    Rating of Perceived Exertion (Exercise)  13    Perceived Dyspnea (Exercise)  3    Duration  Progress to 45 minutes of aerobic exercise without signs/symptoms of physical distress    Intensity  THRR unchanged      Progression   Progression  Continue to progress workloads to maintain intensity without signs/symptoms of physical distress.      Resistance Training   Training  Prescription  Yes    Weight  orange bands    Reps  10-15    Time  10 Minutes      Interval Training   Interval Training  No      Oxygen   Oxygen  Continuous    Liters  2      Treadmill   MPH  1.5    Grade  0    Minutes  17      NuStep   Level  3    SPM  80    Minutes  17      Arm Ergometer   Level  2    Watts  10    Minutes  17       Social History   Tobacco Use  Smoking Status Former Smoker  . Packs/day: 0.00  . Years: 0.00  . Pack years: 0.00  . Types: Cigarettes  . Last attempt to quit: 10/01/1984  . Years since quitting: 33.9  Smokeless Tobacco Never Used    Goals Met:  Exercise tolerated well No report of cardiac concerns or symptoms Strength training completed today  Goals Unmet:  Not Applicable  Comments: Service time is from 1030 to 1215    Dr. Rush Farmer is Medical Director for Pulmonary Rehab at [facility].

## 2018-08-20 ENCOUNTER — Encounter (HOSPITAL_COMMUNITY)
Admission: RE | Admit: 2018-08-20 | Discharge: 2018-08-20 | Disposition: A | Discharge status: Home / Self Care | Payer: Medicare Other | Source: Ambulatory Visit | Attending: Pulmonary Disease | Admitting: Pulmonary Disease

## 2018-08-20 DIAGNOSIS — J439 Emphysema, unspecified: Secondary | ICD-10-CM | POA: Diagnosis not present

## 2018-08-20 DIAGNOSIS — J9611 Chronic respiratory failure with hypoxia: Secondary | ICD-10-CM | POA: Diagnosis not present

## 2018-08-20 DIAGNOSIS — J438 Other emphysema: Secondary | ICD-10-CM

## 2018-08-20 NOTE — Progress Notes (Signed)
Daily Session Note  Patient Details  Name: Antavius Sperbeck MRN: [id number] Date of Birth: [birth year] Referring Provider:     Pulmonary Rehab Walk Test from 07/09/2018 in [place]  Referring Provider  Dr. Lenna Gilford      Encounter Date: 08/20/2018  Check In: Session Check In - 08/20/18 1232      Check-In   Supervising physician immediately available to respond to emergencies  [facility] immediately available    Physician(s)  Dr. Horris Latino    Location  [facility]    Staff Present  Maurice Small, RN, BSN;Molly DiVincenzo, MS, ACSM RCEP, Exercise Physiologist;Bexley Laubach Ysidro Evert, Felipe Drone, RN, MHA    Medication changes reported      No    Fall or balance concerns reported     No    Tobacco Cessation  No Change    Warm-up and Cool-down  Not performed (comment)    Resistance Training Performed  Yes    VAD Patient?  No    PAD/SET Patient?  No      Pain Assessment   Currently in Pain?  No/denies    Multiple Pain Sites  No       Capillary Blood Glucose: No results found for this or any previous visit (from the past 24 hour(s)).    Social History   Tobacco Use  Smoking Status Former Smoker  . Packs/day: 0.00  . Years: 0.00  . Pack years: 0.00  . Types: Cigarettes  . Last attempt to quit: 10/01/1984  . Years since quitting: 33.9  Smokeless Tobacco Never Used    Goals Met:  Exercise tolerated well No report of cardiac concerns or symptoms Strength training completed today  Goals Unmet:  Not Applicable  Comments: Service time is from 1030 to 1215    Dr. Rush Farmer is Medical Director for Pulmonary Rehab at [facility].

## 2018-08-25 ENCOUNTER — Encounter (HOSPITAL_COMMUNITY)
Admission: RE | Admit: 2018-08-25 | Discharge: 2018-08-25 | Disposition: A | Discharge status: Home / Self Care | Payer: Medicare Other | Source: Ambulatory Visit | Attending: Pulmonary Disease | Admitting: Pulmonary Disease

## 2018-08-25 DIAGNOSIS — J439 Emphysema, unspecified: Secondary | ICD-10-CM

## 2018-08-25 DIAGNOSIS — J438 Other emphysema: Secondary | ICD-10-CM

## 2018-08-25 DIAGNOSIS — J9611 Chronic respiratory failure with hypoxia: Secondary | ICD-10-CM | POA: Diagnosis not present

## 2018-08-25 NOTE — Progress Notes (Signed)
Daily Session Note  Patient Details  Name: Kasten Leveque MRN: [id number] Date of Birth: 01-Nov-1941 Referring Provider:     Pulmonary Rehab Walk Test from 07/09/2018 in [place]  Referring Provider  Dr. Lenna Gilford      Encounter Date: 08/25/2018  Check In: Session Check In - 08/25/18 1011      Check-In   Supervising physician immediately available to respond to emergencies  [facility] immediately available    Physician(s)  Dr. Horris Latino    Location  [facility]    Staff Present  Su Hilt, MS, ACSM RCEP, Exercise Physiologist;Dorette Hartel Colletta [place], RN, MHA    Medication changes reported      No    Fall or balance concerns reported     No    Tobacco Cessation  No Change    Warm-up and Cool-down  Performed as group-led instruction    Resistance Training Performed  Yes    VAD Patient?  No    PAD/SET Patient?  No      Pain Assessment   Currently in Pain?  No/denies    Multiple Pain Sites  No       Capillary Blood Glucose: No results found for this or any previous visit (from the past 24 hour(s)).    Social History   Tobacco Use  Smoking Status Former Smoker  . Packs/day: 0.00  . Years: 0.00  . Pack years: 0.00  . Types: Cigarettes  . Last attempt to quit: 10/01/1984  . Years since quitting: 33.9  Smokeless Tobacco Never Used    Goals Met:  Exercise tolerated well No report of cardiac concerns or symptoms Strength training completed today  Goals Unmet:  Not Applicable  Comments: Service time is from 1030 to 1210     Dr. Rush Farmer is Medical Director for Pulmonary Rehab at [facility].

## 2018-08-27 ENCOUNTER — Encounter (HOSPITAL_COMMUNITY)
Admission: RE | Admit: 2018-08-27 | Discharge: 2018-08-27 | Disposition: A | Discharge status: Home / Self Care | Payer: Medicare Other | Source: Ambulatory Visit | Attending: Pulmonary Disease | Admitting: Pulmonary Disease

## 2018-08-27 DIAGNOSIS — J9611 Chronic respiratory failure with hypoxia: Secondary | ICD-10-CM | POA: Diagnosis not present

## 2018-08-27 DIAGNOSIS — J438 Other emphysema: Secondary | ICD-10-CM

## 2018-08-27 DIAGNOSIS — J439 Emphysema, unspecified: Secondary | ICD-10-CM | POA: Diagnosis not present

## 2018-08-27 NOTE — Progress Notes (Signed)
Daily Session Note  Patient Details  Name: Andrew [name] MRN: [id number] Date of Birth: [birth year] Referring Provider:     Pulmonary Rehab Walk Test from 07/09/2018 in [place]  Referring Provider  Dr. Lenna Gilford      Encounter Date: 08/27/2018  Check In: Session Check In - 08/27/18 1048      Check-In   Supervising physician immediately available to respond to emergencies  [facility] immediately available    Physician(s)  Dr. Herbert Moors    Location  [facility]    Staff Present  Su Hilt, MS, ACSM RCEP, Exercise Physiologist;Lisa Colletta [place], RN, MHA;Isley Zinni Kris Mouton, MS, Exercise Physiologist    Medication changes reported      No    Fall or balance concerns reported     No    Tobacco Cessation  No Change    Warm-up and Cool-down  Performed as group-led instruction    Resistance Training Performed  Yes    VAD Patient?  No    PAD/SET Patient?  No      Pain Assessment   Currently in Pain?  No/denies    Multiple Pain Sites  No       Capillary Blood Glucose: No results found for this or any previous visit (from the past 24 hour(s)).    Social History   Tobacco Use  Smoking Status Former Smoker  . Packs/day: 0.00  . Years: 0.00  . Pack years: 0.00  . Types: Cigarettes  . Last attempt to quit: 10/01/1984  . Years since quitting: 33.9  Smokeless Tobacco Never Used    Goals Met:  Exercise tolerated well  Goals Unmet:  Not Applicable  Comments: Service time is from 10:30a to 12:30p    Dr. Rush Farmer is Medical Director for Pulmonary Rehab at [facility].

## 2018-08-28 ENCOUNTER — Other Ambulatory Visit: Payer: Self-pay | Admitting: Pulmonary Disease

## 2018-08-28 DIAGNOSIS — J441 Chronic obstructive pulmonary disease with (acute) exacerbation: Secondary | ICD-10-CM

## 2018-08-28 DIAGNOSIS — I272 Pulmonary hypertension, unspecified: Secondary | ICD-10-CM

## 2018-08-28 DIAGNOSIS — J9611 Chronic respiratory failure with hypoxia: Secondary | ICD-10-CM

## 2018-08-28 DIAGNOSIS — G4733 Obstructive sleep apnea (adult) (pediatric): Secondary | ICD-10-CM

## 2018-08-28 DIAGNOSIS — J432 Centrilobular emphysema: Secondary | ICD-10-CM

## 2018-09-01 ENCOUNTER — Encounter (HOSPITAL_COMMUNITY)
Admission: RE | Admit: 2018-09-01 | Discharge: 2018-09-01 | Disposition: A | Discharge status: Home / Self Care | Payer: Medicare Other | Source: Ambulatory Visit | Attending: Pulmonary Disease | Admitting: Pulmonary Disease

## 2018-09-01 VITALS — Wt 143.5 lb

## 2018-09-01 DIAGNOSIS — J438 Other emphysema: Secondary | ICD-10-CM

## 2018-09-01 DIAGNOSIS — J9611 Chronic respiratory failure with hypoxia: Secondary | ICD-10-CM | POA: Diagnosis not present

## 2018-09-01 DIAGNOSIS — J439 Emphysema, unspecified: Secondary | ICD-10-CM | POA: Diagnosis not present

## 2018-09-01 NOTE — Progress Notes (Signed)
Daily Session Note  Patient Details  Name: Andrew [name] MRN: [id number] Date of Birth: 1941/06/12 Referring Provider:     Pulmonary Rehab Walk Test from 07/09/2018 in [facility] [place]  Referring Provider  Dr. Lenna Gilford      Encounter Date: 09/01/2018  Check In: Session Check In - 09/01/18 1017      Check-In   Supervising physician immediately available to respond to emergencies  [facility] immediately available    Physician(s)  Dr. Herbert Moors    Location  [facility]    Staff Present  Hoy Register, MS, Exercise Physiologist;Lisa Ysidro Evert, Felipe Drone, RN, MHA;Molly DiVincenzo, MS, ACSM RCEP, Exercise Physiologist    Medication changes reported      No    Fall or balance concerns reported     No    Tobacco Cessation  No Change    Warm-up and Cool-down  Performed as group-led instruction    Resistance Training Performed  Yes    VAD Patient?  No    PAD/SET Patient?  No      Pain Assessment   Currently in Pain?  No/denies    Multiple Pain Sites  No       Capillary Blood Glucose: No results found for this or any previous visit (from the past 24 hour(s)).  Exercise Prescription Changes - 09/01/18 1200      Response to Exercise   Blood Pressure (Admit)  118/50    Blood Pressure (Exercise)  146/72    Blood Pressure (Exit)  106/50    Heart Rate (Admit)  78 bpm    Heart Rate (Exercise)  107 bpm    Heart Rate (Exit)  85 bpm    Oxygen Saturation (Admit)  100 %    Oxygen Saturation (Exercise)  95 %    Oxygen Saturation (Exit)  94 %    Rating of Perceived Exertion (Exercise)  13    Perceived Dyspnea (Exercise)  2    Duration  Progress to 45 minutes of aerobic exercise without signs/symptoms of physical distress    Intensity  THRR unchanged      Progression   Progression  Continue to progress workloads to maintain intensity without signs/symptoms of physical distress.      Resistance Training   Training Prescription   Yes    Weight  orange bands    Reps  10-15    Time  10 Minutes      Interval Training   Interval Training  No      Oxygen   Oxygen  Continuous    Liters  2      Treadmill   MPH  1.5    Grade  0    Minutes  17      NuStep   Level  3    SPM  80    Minutes  17    METs  1.8      Arm Ergometer   Level  2    Watts  10    Minutes  17       Social History   Tobacco Use  Smoking Status Former Smoker  . Packs/day: 0.00  . Years: 0.00  . Pack years: 0.00  . Types: Cigarettes  . Last attempt to quit: 10/01/1984  . Years since quitting: 33.9  Smokeless Tobacco Never Used    Goals Met:  Exercise tolerated well  Goals Unmet:  Not Applicable  Comments: Service time is from 10:30A to 12:00P  Dr. Rush Farmer is Medical Director for Pulmonary Rehab at [facility].

## 2018-09-02 NOTE — Progress Notes (Signed)
Pulmonary Individual Treatment Plan  Patient Details  Name: Zuri Bradway MRN: [id number] Date of Birth: December 17, 1940 Referring Provider:     Pulmonary Rehab Walk Test from 07/09/2018 in [place]  Referring Provider  Dr. Lenna Gilford      Initial Encounter Date:    Pulmonary Rehab Walk Test from 07/09/2018 in [place]  Date  07/13/18      Visit Diagnosis: Pulmonary emphysema, unspecified emphysema type ([place])  Other emphysema ([place])  Patient's Home Medications on Admission:   Current Outpatient Medications:  .  ADVAIR DISKUS 250-50 MCG/DOSE AEPB, Inhale 1 puff into the lungs 2 (two) times daily., Disp: 60 each, Rfl: 2 .  albuterol (PROVENTIL HFA;VENTOLIN HFA) 108 (90 Base) MCG/ACT inhaler, Inhale 2 puffs into the lungs every 6 (six) hours as needed for wheezing or shortness of breath., Disp: 1 Inhaler, Rfl: 11 .  apixaban (ELIQUIS) 5 MG TABS tablet, Take 1 tablet (5 mg total) by mouth 2 (two) times daily., Disp: 180 tablet, Rfl: 1 .  diltiazem (CARDIZEM CD) 180 MG 24 hr capsule, Take 1 capsule (180 mg total) by mouth daily., Disp: 90 capsule, Rfl: 2 .  finasteride (PROSCAR) 5 MG tablet, Take 5 mg by mouth at bedtime. , Disp: , Rfl:  .  fluticasone (FLONASE) 50 MCG/ACT nasal spray, Place 1 spray into both nostrils at bedtime. , Disp: , Rfl:  .  guaiFENesin (MUCINEX) 600 MG 12 hr tablet, Take 1 tablet (600 mg total) by mouth 2 (two) times daily., Disp: 30 tablet, Rfl: 0 .  hydrocortisone cream 1 %, Apply 1 application topically daily as needed (Eczema on face and head)., Disp: , Rfl:  .  ipratropium-albuterol (DUONEB) 0.5-2.5 (3) MG/3ML SOLN, Take 3 mLs by nebulization 3 (three) times daily., Disp: 360 mL, Rfl: 2 .  ipratropium-albuterol (DUONEB) 0.5-2.5 (3) MG/3ML SOLN, INHALE 1 VIAL VIA NEBULIZER EVERY three times a day, Disp: 540 mL, Rfl: 2 .  liver oil-zinc oxide (DESITIN) 40 % ointment, Apply 1 application topically daily.,  Disp: , Rfl:  .  Multiple Vitamins-Minerals (MULTIVITAMIN WITH MINERALS) tablet, Take 1 tablet by mouth daily., Disp: , Rfl:  .  omeprazole (PRILOSEC) 40 MG capsule, Take 40 mg by mouth 2 (two) times daily. , Disp: , Rfl:  .  OXYGEN, Inhale 2-3 L into the lungs See admin instructions. 2L daytime, 3L at night , Disp: , Rfl:  .  polyethylene glycol (MIRALAX / GLYCOLAX) packet, Take 17 g by mouth daily. (Patient taking differently: Take 17 g by mouth at bedtime. Mix in 8 oz liquid and drink), Disp: 14 each, Rfl: 1 .  predniSONE (DELTASONE) 10 MG tablet, Take 4 tabs daily for 3 days, then 3 tabs daily for 3 days, then 2 tabs daily for 3 days, then continue 1 tab daily maintenance as you were doing before., Disp: 60 tablet, Rfl: 0 .  rosuvastatin (CRESTOR) 10 MG tablet, Take 10 mg by mouth daily., Disp: , Rfl:  .  sildenafil (REVATIO) 20 MG tablet, Take 1 tablet (20 mg total) by mouth 2 (two) times daily., Disp: 180 tablet, Rfl: 3 .  SPIRIVA HANDIHALER 18 MCG inhalation capsule, INHALE THE CONTENTS OF 1 CAPSULE EVERY DAY, Disp: 90 capsule, Rfl: 3 .  tamsulosin (FLOMAX) 0.4 MG CAPS capsule, Take 1 capsule (0.4 mg total) by mouth daily after breakfast., Disp: 30 capsule, Rfl: 0 .  temazepam (RESTORIL) 30 MG capsule, Take 1 capsule (30 mg total) by mouth at bedtime.,  Disp: 30 capsule, Rfl: 5 .  vitamin C (ASCORBIC ACID) 500 MG tablet, Take 500 mg by mouth 2 (two) times daily. , Disp: , Rfl:   Past Medical History: Past Medical History:  Diagnosis Date  . Anemia   . BiPAP (biphasic positive airway pressure) dependence    Pt denies history of OSA  . BPH (benign prostatic hyperplasia)   . Cancer ([place])    skin  . COPD (chronic obstructive pulmonary disease) ([place])   . Dysphagia   . Emphysema lung ([place])   . GERD (gastroesophageal reflux disease)    " Silent reflux"  . Hearing loss    right ear  . History of hiatal hernia   . HLD (hyperlipidemia)   . Hypertension   . Oxygen dependent    2-3 liters   . Oxygen dependent   . Pneumonia   . Pulmonary hypertension ([place])   . Sleep apnea    wears cpap    Tobacco Use: Social History   Tobacco Use  Smoking Status Former Smoker  . Packs/day: 0.00  . Years: 0.00  . Pack years: 0.00  . Types: Cigarettes  . Last attempt to quit: 10/01/1984  . Years since quitting: 33.9  Smokeless Tobacco Never Used    Labs: Recent Chemical engineer    Labs for ITP Cardiac and Pulmonary Rehab Latest Ref Rng & Units 01/02/2016 01/10/2016 06/28/2016 11/25/2016 12/07/2016   Hemoglobin A1c 4.6 - 6.5 % 6.2(H) - - 6.1 -   PHART 7.350 - 7.450 - - 7.444 - 7.442   PCO2ART 32.0 - 48.0 mmHg - - 35.2 - 38.8   HCO3 20.0 - 28.0 mmol/L - - 24.1(H) - 26.1   TCO2 0 - 100 mmol/L - 26 25 - -   O2SAT % - - 94.0 - 98.5      Capillary Blood Glucose: Lab Results  Component Value Date   GLUCAP 115 (H) 01/21/2018   GLUCAP 125 (H) 01/21/2018   GLUCAP 132 (H) 01/20/2018   GLUCAP 173 (H) 01/19/2018     Pulmonary Assessment Scores: Pulmonary Assessment Scores    Row Name 07/08/18 1515 07/13/18 0933       ADL UCSD   ADL Phase  Entry  Entry    SOB Score total  73  -      CAT Score   CAT Score  24  -      mMRC Score   mMRC Score  -  3       Pulmonary Function Assessment:   Exercise Target Goals: Exercise Program Goal: Individual exercise prescription set using results from initial 6 min walk test and THRR while considering  patient's activity barriers and safety.   Exercise Prescription Goal: Initial exercise prescription builds to 30-45 minutes a day of aerobic activity, 2-3 days per week.  Home exercise guidelines will be given to patient during program as part of exercise prescription that the participant will acknowledge.  Activity Barriers & Risk Stratification: Activity Barriers & Cardiac Risk Stratification - 07/06/18 1405      Activity Barriers & Cardiac Risk Stratification   Activity Barriers  (S) Shortness of Breath;Balance  Concerns;None;Neck/Spine Problems   meniere's disease   Cardiac Risk Stratification  Moderate       6 Minute Walk: 6 Minute Walk    Row Name 07/13/18 1006         6 Minute Walk   Phase  Initial     Distance  800 feet  Walk Time  6 minutes     # of Rest Breaks  0     MPH  1.5     METS  2.15     RPE  13     Perceived Dyspnea   3     Symptoms  Yes (comment)     Comments  used wheelchair     Resting HR  69 bpm     Resting BP  120/64     Resting Oxygen Saturation   97 %     Exercise Oxygen Saturation  during 6 min walk  95 %     Max Ex. HR  85 bpm     Max Ex. BP  130/60       Interval HR   1 Minute HR  73     2 Minute HR  81     3 Minute HR  85     4 Minute HR  79     5 Minute HR  85     6 Minute HR  80     2 Minute Post HR  73     Interval Heart Rate?  Yes       Interval Oxygen   Interval Oxygen?  Yes     Baseline Oxygen Saturation %  97 %     1 Minute Oxygen Saturation %  98 %     1 Minute Liters of Oxygen  3 L     2 Minute Oxygen Saturation %  96 %     2 Minute Liters of Oxygen  3 L     3 Minute Oxygen Saturation %  95 %     3 Minute Liters of Oxygen  3 L     4 Minute Oxygen Saturation %  95 %     4 Minute Liters of Oxygen  3 L     5 Minute Oxygen Saturation %  96 %     5 Minute Liters of Oxygen  3 L     6 Minute Oxygen Saturation %  97 %     6 Minute Liters of Oxygen  3 L     2 Minute Post Oxygen Saturation %  98 %     2 Minute Post Liters of Oxygen  3 L        Oxygen Initial Assessment: Oxygen Initial Assessment - 07/13/18 0930      Initial 6 min Walk   Oxygen Used  Continuous;E-Tanks    Liters per minute  3      Program Oxygen Prescription   Program Oxygen Prescription  Continuous;E-Tanks    Liters per minute  3       Oxygen Re-Evaluation: Oxygen Re-Evaluation    Row Name 08/03/18 1624 09/01/18 1603           Program Oxygen Prescription   Program Oxygen Prescription  Continuous;E-Tanks  Continuous;E-Tanks      Liters per minute  3   3        Home Oxygen   Home Oxygen Device  Portable Concentrator;Home Concentrator  Portable Concentrator;Home Concentrator      Sleep Oxygen Prescription  Continuous;CPAP  Continuous;CPAP      Liters per minute  3  3      Home Exercise Oxygen Prescription  Continuous  Continuous      Liters per minute  3  3      Home at Rest Exercise Oxygen Prescription  Continuous  Continuous  Liters per minute  2  2      Compliance with Home Oxygen Use  Yes  Yes        Goals/Expected Outcomes   Short Term Goals  To learn and exhibit compliance with exercise, home and travel O2 prescription;To learn and understand importance of monitoring SPO2 with pulse oximeter and demonstrate accurate use of the pulse oximeter.;To learn and understand importance of maintaining oxygen saturations>88%;To learn and demonstrate proper pursed lip breathing techniques or other breathing techniques.;To learn and demonstrate proper use of respiratory medications  To learn and exhibit compliance with exercise, home and travel O2 prescription;To learn and understand importance of monitoring SPO2 with pulse oximeter and demonstrate accurate use of the pulse oximeter.;To learn and understand importance of maintaining oxygen saturations>88%;To learn and demonstrate proper pursed lip breathing techniques or other breathing techniques.;To learn and demonstrate proper use of respiratory medications      Long  Term Goals  Exhibits compliance with exercise, home and travel O2 prescription;Verbalizes importance of monitoring SPO2 with pulse oximeter and return demonstration;Maintenance of O2 saturations>88%;Exhibits proper breathing techniques, such as pursed lip breathing or other method taught during program session;Compliance with respiratory medication;Demonstrates proper use of MDI's  Exhibits compliance with exercise, home and travel O2 prescription;Verbalizes importance of monitoring SPO2 with pulse oximeter and return  demonstration;Maintenance of O2 saturations>88%;Exhibits proper breathing techniques, such as pursed lip breathing or other method taught during program session;Compliance with respiratory medication;Demonstrates proper use of MDI's      Goals/Expected Outcomes  compliance  compliance         Oxygen Discharge (Final Oxygen Re-Evaluation): Oxygen Re-Evaluation - 09/01/18 1603      Program Oxygen Prescription   Program Oxygen Prescription  Continuous;E-Tanks    Liters per minute  3      Home Oxygen   Home Oxygen Device  Portable Concentrator;Home Concentrator    Sleep Oxygen Prescription  Continuous;CPAP    Liters per minute  3    Home Exercise Oxygen Prescription  Continuous    Liters per minute  3    Home at Rest Exercise Oxygen Prescription  Continuous    Liters per minute  2    Compliance with Home Oxygen Use  Yes      Goals/Expected Outcomes   Short Term Goals  To learn and exhibit compliance with exercise, home and travel O2 prescription;To learn and understand importance of monitoring SPO2 with pulse oximeter and demonstrate accurate use of the pulse oximeter.;To learn and understand importance of maintaining oxygen saturations>88%;To learn and demonstrate proper pursed lip breathing techniques or other breathing techniques.;To learn and demonstrate proper use of respiratory medications    Long  Term Goals  Exhibits compliance with exercise, home and travel O2 prescription;Verbalizes importance of monitoring SPO2 with pulse oximeter and return demonstration;Maintenance of O2 saturations>88%;Exhibits proper breathing techniques, such as pursed lip breathing or other method taught during program session;Compliance with respiratory medication;Demonstrates proper use of MDI's    Goals/Expected Outcomes  compliance       Initial Exercise Prescription: Initial Exercise Prescription - 07/13/18 1000      Date of Initial Exercise RX and Referring Provider   Date  07/13/18    Referring  Provider  Dr. Lenna Gilford      Oxygen   Oxygen  Continuous    Liters  3      NuStep   Level  2    SPM  80    Minutes  17    METs  1.5      Arm Ergometer   Level  2    Watts  10    Minutes  17      Track   Laps  5    Minutes  17      Prescription Details   Frequency (times per week)  2    Duration  Progress to 45 minutes of aerobic exercise without signs/symptoms of physical distress      Intensity   THRR 40-80% of Max Heartrate  57-114    Ratings of Perceived Exertion  11-13    Perceived Dyspnea  0-4      Progression   Progression  Continue progressive overload as per policy without signs/symptoms or physical distress.      Resistance Training   Training Prescription  Yes    Weight  blue bands    Reps  10-15       Perform Capillary Blood Glucose checks as needed.  Exercise Prescription Changes: Exercise Prescription Changes    Row Name 07/21/18 1200 07/30/18 1003 08/04/18 1300 08/18/18 1500 09/01/18 1200     Response to Exercise   Blood Pressure (Admit)  112/62  -  104/62  134/70  118/50   Blood Pressure (Exercise)  120/56  -  138/72  130/72  146/72   Blood Pressure (Exit)  102/58  -  128/76  122/56  106/50   Heart Rate (Admit)  54 bpm  -  94 bpm  76 bpm  78 bpm   Heart Rate (Exercise)  74 bpm  -  113 bpm  126 bpm  107 bpm   Heart Rate (Exit)  57 bpm  -  90 bpm  76 bpm  85 bpm   Oxygen Saturation (Admit)  99 %  -  98 %  98 %  100 %   Oxygen Saturation (Exercise)  97 %  -  94 %  87 %  95 %   Oxygen Saturation (Exit)  100 %  -  99 %  99 %  94 %   Rating of Perceived Exertion (Exercise)  12  -  _0 Perceived Dyspnea (Exercise)  2  -  _1 Duration  Progress to 45 minutes of aerobic exercise without signs/symptoms of physical distress  -  Progress to 45 minutes of aerobic exercise without signs/symptoms of physical distress  Progress to 45 minutes of aerobic exercise without signs/symptoms of physical distress  Progress to 45 minutes of aerobic exercise  without signs/symptoms of physical distress   Intensity  THRR unchanged  -  THRR unchanged  THRR unchanged  THRR unchanged     Progression   Progression  Continue to progress workloads to maintain intensity without signs/symptoms of physical distress.  -  Continue to progress workloads to maintain intensity without signs/symptoms of physical distress.  Continue to progress workloads to maintain intensity without signs/symptoms of physical distress.  Continue to progress workloads to maintain intensity without signs/symptoms of physical distress.     Resistance Training   Training Prescription  Yes  -  Yes  Yes  Yes   Weight  blue bands  -  orange bands  orange bands  orange bands   Reps  10-15  -  10-15  10-15  10-15   Time  -  -  10 Minutes  10 Minutes  10 Minutes     Interval Training   Interval Training  -  -  No  No  No     Oxygen   Oxygen  Continuous  -  -  Continuous  Continuous   Liters  3  -  -  2  2     Treadmill   MPH  1.3  -  1.5  1.5  1.5   Grade  0  -  0  0  0   Minutes  17  -  _0 NuStep   Level  2  -  _1 SPM  80  -  80  80  80   Minutes  17  -  _2 METs  1.6  -  1.6  -  1.8     Arm Ergometer   Level  2  -  _3 Watts  10  -  _4 Minutes  17  -  _5 Home Exercise Plan   Plans to continue exercise at  -  Home (comment)  -  -  -   Frequency  -  Add 2 additional days to program exercise sessions.  -  -  -      Exercise Comments: Exercise Comments    Row Name 07/31/18 1003           Exercise Comments  Home exercise completed          Exercise Goals and Review: Exercise Goals    Ali [place]on Name 07/06/18 1406             Exercise Goals   Increase Physical Activity  Yes       Intervention  Provide advice, education, support and counseling about physical activity/exercise needs.;Develop an individualized exercise prescription for aerobic and resistive training based on initial evaluation findings, risk  stratification, comorbidities and participant's personal goals.       Expected Outcomes  Short Term: Attend rehab on a regular basis to increase amount of physical activity.;Long Term: Add in home exercise to make exercise part of routine and to increase amount of physical activity.;Long Term: Exercising regularly at least 3-5 days a week.       Increase Strength and Stamina  Yes       Intervention  Provide advice, education, support and counseling about physical activity/exercise needs.;Develop an individualized exercise prescription for aerobic and resistive training based on initial evaluation findings, risk stratification, comorbidities and participant's personal goals.       Expected Outcomes  Short Term: Increase workloads from initial exercise prescription for resistance, speed, and METs.;Short Term: Perform resistance training exercises routinely during rehab and add in resistance training at home;Long Term: Improve cardiorespiratory fitness, muscular endurance and strength as measured by increased METs and functional capacity (6MWT)       Able to understand and use rate of perceived exertion (RPE) scale  Yes       Intervention  Provide education and explanation on how to use RPE scale       Expected Outcomes  Short Term: Able to use RPE daily in rehab to express subjective intensity level;Long Term:  Able to use RPE to guide intensity level when exercising independently       Able to understand and use Dyspnea scale  Yes       Intervention  Provide education and explanation on how to  use Dyspnea scale       Expected Outcomes  Short Term: Able to use Dyspnea scale daily in rehab to express subjective sense of shortness of breath during exertion;Long Term: Able to use Dyspnea scale to guide intensity level when exercising independently       Knowledge and understanding of Target Heart Rate Range (THRR)  Yes       Intervention  Provide education and explanation of THRR including how the numbers  were predicted and where they are located for reference       Expected Outcomes  Short Term: Able to state/look up THRR;Short Term: Able to use daily as guideline for intensity in rehab;Long Term: Able to use THRR to govern intensity when exercising independently       Understanding of Exercise Prescription  Yes       Intervention  Provide education, explanation, and written materials on patient's individual exercise prescription       Expected Outcomes  Short Term: Able to explain program exercise prescription;Long Term: Able to explain home exercise prescription to exercise independently          Exercise Goals Re-Evaluation : Exercise Goals Re-Evaluation    Row Name 08/03/18 1625 09/01/18 1605           Exercise Goal Re-Evaluation   Exercise Goals Review  Increase Physical Activity;Increase Strength and Stamina;Able to understand and use rate of perceived exertion (RPE) scale;Able to understand and use Dyspnea scale;Knowledge and understanding of Target Heart Rate Range (THRR);Understanding of Exercise Prescription  Increase Physical Activity;Increase Strength and Stamina;Able to understand and use rate of perceived exertion (RPE) scale;Able to understand and use Dyspnea scale;Knowledge and understanding of Target Heart Rate Range (THRR);Understanding of Exercise Prescription      Comments  Patient is making slow progress. Can be gaurded at times with how hard he pushes himself. MET average places him in a lower level. Is motivated to make changes. Home exercise was discussed.. Patient wants to get back to walking 45 minutes at a time. Will cont. to monitor and motivate as able.   Patient is making slow progress. Can be gaurded at times with how hard he pushes himself. MET average places him in a lower level. Is motivated to make changes. Home exercise was discussed.. Patient wants to get back to walking 45 minutes at a time. Will cont. to monitor and motivate as able.       Expected Outcomes   Through exercise at rehab and at home, the patient will decrease shortness of breath with daily activities and feel confident in carrying out an exercise regime at home.   Through exercise at rehab and at home, the patient will decrease shortness of breath with daily activities and feel confident in carrying out an exercise regime at home.          Discharge Exercise Prescription (Final Exercise Prescription Changes): Exercise Prescription Changes - 09/01/18 1200      Response to Exercise   Blood Pressure (Admit)  118/50    Blood Pressure (Exercise)  146/72    Blood Pressure (Exit)  106/50    Heart Rate (Admit)  78 bpm    Heart Rate (Exercise)  107 bpm    Heart Rate (Exit)  85 bpm    Oxygen Saturation (Admit)  100 %    Oxygen Saturation (Exercise)  95 %    Oxygen Saturation (Exit)  94 %    Rating of Perceived Exertion (Exercise)  13  Perceived Dyspnea (Exercise)  2    Duration  Progress to 45 minutes of aerobic exercise without signs/symptoms of physical distress    Intensity  THRR unchanged      Progression   Progression  Continue to progress workloads to maintain intensity without signs/symptoms of physical distress.      Resistance Training   Training Prescription  Yes    Weight  orange bands    Reps  10-15    Time  10 Minutes      Interval Training   Interval Training  No      Oxygen   Oxygen  Continuous    Liters  2      Treadmill   MPH  1.5    Grade  0    Minutes  17      NuStep   Level  3    SPM  80    Minutes  17    METs  1.8      Arm Ergometer   Level  2    Watts  10    Minutes  17       Nutrition:  Target Goals: Understanding of nutrition guidelines, daily intake of sodium <1545m, cholesterol <2086m calories 30% from fat and 7% or less from saturated fats, daily to have 5 or more servings of fruits and vegetables.  Biometrics:    Nutrition Therapy Plan and Nutrition Goals: Nutrition Therapy & Goals - 07/07/18 0830      Nutrition Therapy    Diet  general healthful      Personal Nutrition Goals   Nutrition Goal  identify and limit dietary sources of sodium    Personal Goal #2  Identify food quantities necessary to achieve wt maintenance at graduation from pulmonary rehab      Intervention Plan   Intervention  Prescribe, educate and counsel regarding individualized specific dietary modifications aiming towards targeted core components such as weight, hypertension, lipid management, diabetes, heart failure and other comorbidities.    Expected Outcomes  Short Term Goal: Understand basic principles of dietary content, such as calories, fat, sodium, cholesterol and nutrients.       Nutrition Assessments: Nutrition Assessments - 07/07/18 0827      Rate Your Plate Scores   Pre Score  51       Nutrition Goals Re-Evaluation: Nutrition Goals Re-Evaluation    [place] 07/07/18 0827 07/07/18 0830           Goals   Current Weight  142 lb 13.7 oz (64.8 kg)  142 lb 13.7 oz (64.8 kg)      Nutrition Goal  identify and limit dietary sources of sodium  -      Comment  -  Pt wt is down 2 lb since admission         Nutrition Goals Discharge (Final Nutrition Goals Re-Evaluation): Nutrition Goals Re-Evaluation - 07/07/18 0830      Goals   Current Weight  142 lb 13.7 oz (64.8 kg)    Comment  Pt wt is down 2 lb since admission       Psychosocial: Target Goals: Acknowledge presence or absence of significant depression and/or stress, maximize coping skills, provide positive support system. Participant is able to verbalize types and ability to use techniques and skills needed for reducing stress and depression.  Initial Review & Psychosocial Screening: Initial Psych Review & Screening - 07/06/18 1411      Initial Review   Current issues with  None  Identified      Family Dynamics   Good Support System?  Yes    Comments  lives with daughter who is very supportive      Barriers   Psychosocial barriers to participate in  program  There are no identifiable barriers or psychosocial needs.      Screening Interventions   Interventions  Encouraged to exercise    Expected Outcomes  Short Term goal: Identification and review with participant of any Quality of Life or Depression concerns found by scoring the questionnaire.;Long Term goal: The participant improves quality of Life and PHQ9 Scores as seen by post scores and/or verbalization of changes       Quality of Life Scores:  Scores of 19 and below usually indicate a poorer quality of life in these areas.  A difference of  2-3 points is a clinically meaningful difference.  A difference of 2-3 points in the total score of the Quality of Life Index has been associated with significant improvement in overall quality of life, self-image, physical symptoms, and general health in studies assessing change in quality of life.  PHQ-9: Recent Review Flowsheet Data    Depression screen [facility] 2/9 07/06/2018 08/15/2016 06/20/2016 04/29/2016   Decreased Interest 0 0 0 0   Down, Depressed, Hopeless 0 0 0 0   PHQ - 2 Score 0 0 0 0   Altered sleeping 1 - - -   Tired, decreased energy 0 - - -   Change in appetite 0 - - -   Feeling bad or failure about yourself  0 - - -   Trouble concentrating 0 - - -   Moving slowly or fidgety/restless 0 - - -   Suicidal thoughts 0 - - -   PHQ-9 Score 1 - - -   Difficult doing work/chores Not difficult at all - - -     Interpretation of Total Score  Total Score Depression Severity:  1-4 = Minimal depression, 5-9 = Mild depression, 10-14 = Moderate depression, 15-19 = Moderately severe depression, 20-27 = Severe depression   Psychosocial Evaluation and Intervention: Psychosocial Evaluation - 09/02/18 1753      Psychosocial Evaluation & Interventions   Interventions  Encouraged to exercise with the program and follow exercise prescription    Comments  None identified, pt is delighful and interacts well with other participants.  Participated in  pulmonary rehab before    Expected Outcomes  Pt will continue to display positve and healthy coping skills.      Continue Psychosocial Services   Follow up required by staff       Psychosocial Re-Evaluation: Psychosocial Re-Evaluation    [place] Name 08/05/18 1209 09/02/18 1753           Psychosocial Re-Evaluation   Current issues with  None Identified  None Identified      Comments  no psychosocial barriers identified   no psychosocial barriers identified       Expected Outcomes  Pt will continue to display mental well being   Pt will continue to display mental well being       Interventions  Stress management education;Physician referral;Encouraged to attend Pulmonary Rehabilitation for the exercise  Stress management education;Physician referral;Encouraged to attend Pulmonary Rehabilitation for the exercise      Continue Psychosocial Services   -  No Follow up required         Psychosocial Discharge (Final Psychosocial Re-Evaluation): Psychosocial Re-Evaluation - 09/02/18 1753      Psychosocial  Re-Evaluation   Current issues with  None Identified    Comments  no psychosocial barriers identified     Expected Outcomes  Pt will continue to display mental well being     Interventions  Stress management education;Physician referral;Encouraged to attend Pulmonary Rehabilitation for the exercise    Continue Psychosocial Services   No Follow up required       Education: Education Goals: Education classes will be provided on a weekly basis, covering required topics. Participant will state understanding/return demonstration of topics presented.  Learning Barriers/Preferences: Learning Barriers/Preferences - 07/06/18 1412      Learning Barriers/Preferences   Learning Barriers  Sight;Hearing    Learning Preferences  Written Material;Skilled Demonstration       Education Topics: Risk Factor Reduction:  -Group instruction that is supported by a PowerPoint presentation. Instructor  discusses the definition of a risk factor, different risk factors for pulmonary disease, and how the heart and lungs work together.     Nutrition for Pulmonary Patient:  -Group instruction provided by PowerPoint slides, verbal discussion, and written materials to support subject matter. The instructor gives an explanation and review of healthy diet recommendations, which includes a discussion on weight management, recommendations for fruit and vegetable consumption, as well as protein, fluid, caffeine, fiber, sodium, sugar, and alcohol. Tips for eating when patients are short of breath are discussed.   PULMONARY REHAB OTHER RESPIRATORY from 08/27/2018 in [place]  Date  07/16/18  Educator  Rodman Pickle  Instruction Review Code  2- Demonstrated Understanding      Pursed Lip Breathing:  -Group instruction that is supported by demonstration and informational handouts. Instructor discusses the benefits of pursed lip and diaphragmatic breathing and detailed demonstration on how to preform both.     PULMONARY REHAB OTHER RESPIRATORY from 08/08/2016 in [place]  Date  07/25/16  Educator  RT  Instruction Review Code (Retired)  R- Review/reinforce      Oxygen Safety:  -Group instruction provided by PowerPoint, verbal discussion, and written material to support subject matter. There is an overview of "What is Oxygen" and "Why do we need it".  Instructor also reviews how to create a safe environment for oxygen use, the importance of using oxygen as prescribed, and the risks of noncompliance. There is a brief discussion on traveling with oxygen and resources the patient may utilize.   PULMONARY REHAB OTHER RESPIRATORY from 08/27/2018 in [place]  Date  08/20/18  Educator  molly  Instruction Review Code  1- Verbalizes Understanding      Oxygen Equipment:  -Group instruction provided by [employer], written materials, and Insurance underwriter.   PULMONARY REHAB OTHER RESPIRATORY from 08/27/2018 in [place]  Date  08/27/18  Educator  [employer]  Instruction Review Code  1- Verbalizes Understanding      Signs and Symptoms:  -Group instruction provided by written material and verbal discussion to support subject matter. Warning signs and symptoms of infection, stroke, and heart attack are reviewed and when to call the physician/911 reinforced. Tips for preventing the spread of infection discussed.   PULMONARY REHAB OTHER RESPIRATORY from 08/27/2018 in [place]  Date  08/13/18  Educator  RN  Instruction Review Code  2- Demonstrated Understanding      Advanced Directives:  -Group instruction provided by verbal instruction and written material to support subject matter. Instructor reviews  Advanced Directive laws and proper instruction for filling out document.   Pulmonary Video:  -Group video education that reviews the importance of medication and oxygen compliance, exercise, good nutrition, pulmonary hygiene, and pursed lip and diaphragmatic breathing for the pulmonary patient.   PULMONARY REHAB OTHER RESPIRATORY from 08/08/2016 in [place]  Date  07/18/16  Instruction Review Code (Retired)  2- meets goals/outcomes      Exercise for the Pulmonary Patient:  -Group instruction that is supported by a PowerPoint presentation. Instructor discusses benefits of exercise, core components of exercise, frequency, duration, and intensity of an exercise routine, importance of utilizing pulse oximetry during exercise, safety while exercising, and options of places to exercise outside of rehab.     PULMONARY REHAB OTHER RESPIRATORY from 08/08/2016 in [place]  Date  08/08/16  Educator  EP  Instruction Review Code (Retired)  2- meets  goals/outcomes      Pulmonary Medications:  -Verbally interactive group education provided by Art therapist with focus on inhaled medications and proper administration.   PULMONARY REHAB OTHER RESPIRATORY from 08/27/2018 in [place]  Date  07/28/18  Educator  pharm  Instruction Review Code  2- Demonstrated Understanding      Anatomy and Physiology of the Respiratory System and Intimacy:  -Group instruction provided by PowerPoint, verbal discussion, and written material to support subject matter. Instructor reviews respiratory cycle and anatomical components of the respiratory system and their functions. Instructor also reviews differences in obstructive and restrictive respiratory diseases with examples of each. Intimacy, Sex, and Sexuality differences are reviewed with a discussion on how relationships can change when diagnosed with pulmonary disease. Common sexual concerns are reviewed.   PULMONARY REHAB OTHER RESPIRATORY from 08/08/2016 in [place]  Date  06/06/16  Educator  RN  Instruction Review Code (Retired)  2- meets goals/outcomes      MD DAY -A group question and answer session with a medical doctor that allows participants to ask questions that relate to their pulmonary disease state.   OTHER EDUCATION -Group or individual verbal, written, or video instructions that support the educational goals of the pulmonary rehab program.   PULMONARY REHAB OTHER RESPIRATORY from 08/27/2018 in [place]  Date  08/06/18  Educator  Cloyde Reams  Instruction Review Code  1- Verbalizes Understanding [Sedentary Lifestyle]      Holiday Eating Survival Tips:  -Group instruction provided by [employer], verbal discussion, and written materials to support subject matter. The instructor gives patients tips, tricks, and techniques to help them not only survive but enjoy the holidays despite the  onslaught of food that accompanies the holidays.   Knowledge Questionnaire Score: Knowledge Questionnaire Score - 07/08/18 1514      Knowledge Questionnaire Score   Pre Score  16/18       Core Components/Risk Factors/Patient Goals at Admission: Personal Goals and Risk Factors at Admission - 07/06/18 1415      Core Components/Risk Factors/Patient Goals on Admission    Weight Management  Yes;Weight Maintenance    Admit Weight  142 lb 13.7 oz (64.8 kg)    Goal Weight: Long Term  140 lb (63.5 kg)    Expected Outcomes  Understanding of distribution of calorie intake throughout the day with the consumption of 4-5 meals/snacks;Understanding recommendations for meals to include 15-35% energy as protein, 25-35% energy from fat, 35-60% energy from carbohydrates, less than 215m of dietary  cholesterol, 20-35 gm of total fiber daily;Weight Maintenance: Understanding of the daily nutrition guidelines, which includes 25-35% calories from fat, 7% or less cal from saturated fats, less than 281m cholesterol, less than 1.5gm of sodium, & 5 or more servings of fruits and vegetables daily;Short Term: Continue to assess and modify interventions until short term weight is achieved;Long Term: Adherence to nutrition and physical activity/exercise program aimed toward attainment of established weight goal    Improve shortness of breath with ADL's  Yes    Intervention  Provide education, individualized exercise plan and daily activity instruction to help decrease symptoms of SOB with activities of daily living.    Expected Outcomes  Short Term: Improve cardiorespiratory fitness to achieve a reduction of symptoms when performing ADLs;Long Term: Be able to perform more ADLs without symptoms or delay the onset of symptoms       Core Components/Risk Factors/Patient Goals Review:  Goals and Risk Factor Review    [place] Name 08/05/18 1210 09/02/18 1753           Core Components/Risk Factors/Patient Goals Review    Personal Goals Review  Weight Management/Obesity;Develop more efficient breathing techniques such as purse lipped breathing and diaphragmatic breathing and practicing self-pacing with activity.;Increase knowledge of respiratory medications and ability to use respiratory devices properly.;Improve shortness of breath with ADL's  Weight Management/Obesity;Develop more efficient breathing techniques such as purse lipped breathing and diaphragmatic breathing and practicing self-pacing with activity.;Increase knowledge of respiratory medications and ability to use respiratory devices properly.;Improve shortness of breath with ADL's      Review  Pt who is well known by rehab staff for pulmonary and cardiac rehab, is off to a great start.  Pt has completed 6 exercise sessions.  Pt has attended education on respiratory medications. Pt consistently reporrts level 2 for Dyspnea. Pt demonstrates appropriate technique for PLB and  self pacing.  Pt exercise  wtih level 2.0 on the bike, Treadmill 1.5/0 and nustep level 2.  Anticiapte that pt will show progress in workloads in the next 30 day assessnent.   Pt has completed 14 exercise sessions and 6 education classes.  Pt consistently reporrts level 2 for Dyspnea. Pt demonstrates appropriate technique for PLB and  self pacing.  Pt exercise  wtih level 2.0 on the arm crank Treadmill 1.5/0 and nustep increase to level 3.  Pt used his POC while on the treadmill.  Pt tried 1.6/1.0 but had to decrease back to 1.5/0 due to RPE 17, HR 126 and o2 sat 87 inc to 3l on the POC.   Anticiapte that pt will show progress in workloads in the next 30 day assessnent.      Expected Outcomes  See Admission Goals/Outcomes  See Admission Goals/Outcomes         Core Components/Risk Factors/Patient Goals at Discharge (Final Review):  Goals and Risk Factor Review - 09/02/18 1753      Core Components/Risk Factors/Patient Goals Review   Personal Goals Review  Weight Management/Obesity;Develop more  efficient breathing techniques such as purse lipped breathing and diaphragmatic breathing and practicing self-pacing with activity.;Increase knowledge of respiratory medications and ability to use respiratory devices properly.;Improve shortness of breath with ADL's    Review   Pt has completed 14 exercise sessions and 6 education classes.  Pt consistently reporrts level 2 for Dyspnea. Pt demonstrates appropriate technique for PLB and  self pacing.  Pt exercise  wtih level 2.0 on the arm crank Treadmill 1.5/0 and nustep increase to level 3.  Pt used his POC while on the treadmill.  Pt tried 1.6/1.0 but had to decrease back to 1.5/0 due to RPE 17, HR 126 and o2 sat 87 inc to 3l on the POC.   Anticiapte that pt will show progress in workloads in the next 30 day assessnent.    Expected Outcomes  See Admission Goals/Outcomes       ITP Comments: ITP Comments    [place] Name 07/06/18 1350 08/05/18 1208 09/02/18 1753       ITP Comments  Dr. Jennet Maduro, Medical Director  Dr. Jennet Maduro, Medical Director Pulmonary Rehab  Dr. Jennet Maduro, Medical Director Pulmonary Rehab        Comments: Pt has completed 14 exercise sessions. Continue to monitor. Cherre Huger, BSN Cardiac and Training and development officer

## 2018-09-03 ENCOUNTER — Encounter (HOSPITAL_COMMUNITY)
Admission: RE | Admit: 2018-09-03 | Discharge: 2018-09-03 | Disposition: A | Discharge status: Home / Self Care | Payer: Medicare Other | Source: Ambulatory Visit | Attending: Pulmonary Disease | Admitting: Pulmonary Disease

## 2018-09-03 DIAGNOSIS — J438 Other emphysema: Secondary | ICD-10-CM

## 2018-09-03 DIAGNOSIS — J439 Emphysema, unspecified: Secondary | ICD-10-CM | POA: Diagnosis not present

## 2018-09-03 DIAGNOSIS — J9611 Chronic respiratory failure with hypoxia: Secondary | ICD-10-CM | POA: Diagnosis not present

## 2018-09-03 NOTE — Progress Notes (Signed)
Daily Session Note  Patient Details  Name: Andrew [name] MRN: [id number] Date of Birth: 05/19/1941 Referring Provider:     Pulmonary Rehab Walk Test from 07/09/2018 in [place]  Referring Provider  Dr. Lenna Gilford      Encounter Date: 09/03/2018  Check In: Session Check In - 09/03/18 1046      Check-In   Supervising physician immediately available to respond to emergencies  [facility] immediately available    Physician(s)  Dr. Ree Kida    Location  [facility]    Staff Present  Hoy Register, MS, Exercise Physiologist;Lisa Ysidro Evert, RN;Molly DiVincenzo, MS, ACSM RCEP, Exercise Physiologist;Annedrea Stackhouse, RN, MHA    Medication changes reported      No    Fall or balance concerns reported     No    Tobacco Cessation  No Change    Warm-up and Cool-down  Performed as group-led instruction    Resistance Training Performed  Yes    VAD Patient?  No    PAD/SET Patient?  No      Pain Assessment   Currently in Pain?  No/denies    Multiple Pain Sites  No       Capillary Blood Glucose: No results found for this or any previous visit (from the past 24 hour(s)).    Social History   Tobacco Use  Smoking Status Former Smoker  . Packs/day: 0.00  . Years: 0.00  . Pack years: 0.00  . Types: Cigarettes  . Last attempt to quit: 10/01/1984  . Years since quitting: 33.9  Smokeless Tobacco Never Used    Goals Met:  Proper associated with RPD/PD & O2 Sat Exercise tolerated well No report of cardiac concerns or symptoms Strength training completed today  Goals Unmet:  Not Applicable  Comments: Service time is from 1030 to 1205    Dr. Rush Farmer is Medical Director for Pulmonary Rehab at [facility].

## 2018-09-08 ENCOUNTER — Encounter (HOSPITAL_COMMUNITY)
Admission: RE | Admit: 2018-09-08 | Discharge: 2018-09-08 | Disposition: A | Discharge status: Home / Self Care | Payer: Medicare Other | Source: Ambulatory Visit | Attending: Pulmonary Disease | Admitting: Pulmonary Disease

## 2018-09-08 DIAGNOSIS — J439 Emphysema, unspecified: Secondary | ICD-10-CM | POA: Diagnosis not present

## 2018-09-08 DIAGNOSIS — J9611 Chronic respiratory failure with hypoxia: Secondary | ICD-10-CM | POA: Diagnosis not present

## 2018-09-08 DIAGNOSIS — J438 Other emphysema: Secondary | ICD-10-CM

## 2018-09-08 NOTE — Progress Notes (Signed)
Daily Session Note  Patient Details  Name: Andrew [name] MRN: [id number] Date of Birth: 01/05/1941 Referring Provider:     Pulmonary Rehab Walk Test from 07/09/2018 in [place]  Referring Provider  Dr. Lenna Gilford      Encounter Date: 09/08/2018  Check In: Session Check In - 09/08/18 1018      Check-In   Supervising physician immediately available to respond to emergencies  [facility] immediately available    Physician(s)  Dr. Ree Kida    Location  [facility]    Staff Present  Rodney Langton, RN;Dalton Kris Mouton, MS, Exercise Physiologist;Annedrea Rosezella [place], RN, MHA;Molly DiVincenzo, MS, ACSM RCEP, Exercise Physiologist    Medication changes reported      No    Fall or balance concerns reported     No    Tobacco Cessation  No Change    Warm-up and Cool-down  Performed as group-led instruction    Resistance Training Performed  Yes    VAD Patient?  No    PAD/SET Patient?  No      Pain Assessment   Currently in Pain?  No/denies    Multiple Pain Sites  No       Capillary Blood Glucose: No results found for this or any previous visit (from the past 24 hour(s)).    Social History   Tobacco Use  Smoking Status Former Smoker  . Packs/day: 0.00  . Years: 0.00  . Pack years: 0.00  . Types: Cigarettes  . Last attempt to quit: 10/01/1984  . Years since quitting: 33.9  Smokeless Tobacco Never Used    Goals Met:  Exercise tolerated well No report of cardiac concerns or symptoms Strength training completed today  Goals Unmet:  Not Applicable  Comments: Service time is from 1030 to 1200    Dr. Rush Farmer is Medical Director for Pulmonary Rehab at [facility].

## 2018-09-10 ENCOUNTER — Encounter (HOSPITAL_COMMUNITY)
Admission: RE | Admit: 2018-09-10 | Discharge: 2018-09-10 | Disposition: A | Discharge status: Home / Self Care | Payer: Medicare Other | Source: Ambulatory Visit | Attending: Pulmonary Disease | Admitting: Pulmonary Disease

## 2018-09-10 DIAGNOSIS — J9611 Chronic respiratory failure with hypoxia: Secondary | ICD-10-CM | POA: Diagnosis not present

## 2018-09-10 DIAGNOSIS — J439 Emphysema, unspecified: Secondary | ICD-10-CM | POA: Diagnosis not present

## 2018-09-10 DIAGNOSIS — J438 Other emphysema: Secondary | ICD-10-CM

## 2018-09-10 NOTE — Progress Notes (Signed)
Daily Session Note  Patient Details  Name: Andrew [name] MRN: [id number] Date of Birth: 05-23-1941 Referring Provider:     Pulmonary Rehab Walk Test from 07/09/2018 in [place]  Referring Provider  Dr. Lenna Gilford      Encounter Date: 09/10/2018  Check In: Session Check In - 09/10/18 1029      Check-In   Supervising physician immediately available to respond to emergencies  [facility] immediately available    Physician(s)  Dr. Tyrell Antonio    Location  [facility]    Staff Present  Hoy Register, MS, Exercise Physiologist;Lisa Ysidro Evert, Felipe Drone, RN, MHA;Molly DiVincenzo, MS, ACSM RCEP, Exercise Physiologist    Medication changes reported      No    Tobacco Cessation  No Change    Warm-up and Cool-down  Performed as group-led instruction    Resistance Training Performed  Yes    VAD Patient?  No    PAD/SET Patient?  No      Pain Assessment   Currently in Pain?  No/denies    Multiple Pain Sites  No       Capillary Blood Glucose: No results found for this or any previous visit (from the past 24 hour(s)).    Social History   Tobacco Use  Smoking Status Former Smoker  . Packs/day: 0.00  . Years: 0.00  . Pack years: 0.00  . Types: Cigarettes  . Last attempt to quit: 10/01/1984  . Years since quitting: 33.9  Smokeless Tobacco Never Used    Goals Met:  Proper associated with RPD/PD & O2 Sat Exercise tolerated well  Goals Unmet:  Not Applicable  Comments: Service time is from 1030 to 1230    Dr. Rush Farmer is Medical Director for Pulmonary Rehab at [facility].

## 2018-09-15 ENCOUNTER — Encounter (HOSPITAL_COMMUNITY)
Admission: RE | Admit: 2018-09-15 | Discharge: 2018-09-15 | Disposition: A | Discharge status: Home / Self Care | Payer: Medicare Other | Source: Ambulatory Visit | Attending: Pulmonary Disease | Admitting: Pulmonary Disease

## 2018-09-15 VITALS — Wt 140.4 lb

## 2018-09-15 DIAGNOSIS — J438 Other emphysema: Secondary | ICD-10-CM

## 2018-09-15 DIAGNOSIS — J439 Emphysema, unspecified: Secondary | ICD-10-CM | POA: Diagnosis not present

## 2018-09-15 DIAGNOSIS — J9611 Chronic respiratory failure with hypoxia: Secondary | ICD-10-CM | POA: Diagnosis not present

## 2018-09-15 NOTE — Progress Notes (Signed)
Daily Session Note  Patient Details  Name: Andrew [name] MRN: [id number] Date of Birth: 1940/12/01 Referring Provider:     Pulmonary Rehab Walk Test from 07/09/2018 in [place]  Referring Provider  Dr. Lenna Gilford      Encounter Date: 09/15/2018  Check In: Session Check In - 09/15/18 1209      Check-In   Supervising physician immediately available to respond to emergencies  [facility] immediately available    Physician(s)  Dr. Tyrell Antonio    Location  [facility]    Staff Present  Hoy Register, MS, Exercise Physiologist;Lisa Ysidro Evert, Felipe Drone, RN, MHA;Molly DiVincenzo, MS, ACSM RCEP, Exercise Physiologist;[name], RN, BSN    Medication changes reported      No    Fall or balance concerns reported     No    Tobacco Cessation  No Change    Warm-up and Cool-down  Performed as group-led instruction    Resistance Training Performed  Yes    VAD Patient?  No    PAD/SET Patient?  No      Pain Assessment   Currently in Pain?  No/denies    Multiple Pain Sites  No       Capillary Blood Glucose: No results found for this or any previous visit (from the past 24 hour(s)).  Exercise Prescription Changes - 09/15/18 1200      Response to Exercise   Blood Pressure (Admit)  118/86    Blood Pressure (Exercise)  106/50    Blood Pressure (Exit)  110/64    Heart Rate (Admit)  96 bpm    Heart Rate (Exercise)  116 bpm    Heart Rate (Exit)  97 bpm    Oxygen Saturation (Admit)  99 %    Oxygen Saturation (Exercise)  96 %    Oxygen Saturation (Exit)  97 %    Rating of Perceived Exertion (Exercise)  14    Perceived Dyspnea (Exercise)  2    Duration  Progress to 45 minutes of aerobic exercise without signs/symptoms of physical distress    Intensity  THRR unchanged      Progression   Progression  Continue to progress workloads to maintain intensity without signs/symptoms of physical distress.      Resistance Training   Training Prescription  Yes    Weight  orange bands    Reps  10-15    Time  10 Minutes      Interval Training   Interval Training  No      Oxygen   Oxygen  Continuous    Liters  2      Treadmill   MPH  1.5    Grade  0    Minutes  17      NuStep   Level  3    SPM  80    Minutes  17    METs  1.3      Arm Ergometer   Level  2    Watts  10    Minutes  17       Social History   Tobacco Use  Smoking Status Former Smoker  . Packs/day: 0.00  . Years: 0.00  . Pack years: 0.00  . Types: Cigarettes  . Last attempt to quit: 10/01/1984  . Years since quitting: 33.9  Smokeless Tobacco Never Used    Goals Met:  Exercise tolerated well  Goals Unmet:  Not Applicable  Comments: Service time is from 10:30  AM to 12:00 PM     Dr. Rush Farmer is Medical Director for Pulmonary Rehab at [facility]

## 2018-09-17 ENCOUNTER — Encounter (HOSPITAL_COMMUNITY)
Admission: RE | Admit: 2018-09-17 | Discharge: 2018-09-17 | Disposition: A | Discharge status: Home / Self Care | Payer: Medicare Other | Source: Ambulatory Visit | Attending: Pulmonary Disease | Admitting: Pulmonary Disease

## 2018-09-17 DIAGNOSIS — J9611 Chronic respiratory failure with hypoxia: Secondary | ICD-10-CM | POA: Diagnosis not present

## 2018-09-17 DIAGNOSIS — J439 Emphysema, unspecified: Secondary | ICD-10-CM

## 2018-09-17 DIAGNOSIS — J438 Other emphysema: Secondary | ICD-10-CM

## 2018-09-17 NOTE — Progress Notes (Signed)
Daily Session Note  Patient Details  Name: Ethelbert Thain MRN: [id number] Date of Birth: 1941/05/04 Referring Provider:     Pulmonary Rehab Walk Test from 07/09/2018 in [place]  Referring Provider  Dr. Lenna Gilford      Encounter Date: 09/17/2018  Check In: Session Check In - 09/17/18 1049      Check-In   Supervising physician immediately available to respond to emergencies  [facility] immediately available    Physician(s)  Dr. Algis Liming    Location  [facility]    Staff Present  Hoy Register, MS, Exercise Physiologist;Lisa Ysidro Evert, Felipe Drone, RN, MHA;Molly DiVincenzo, MS, ACSM RCEP, Exercise Physiologist;[name], RN, BSN    Medication changes reported      No    Fall or balance concerns reported     No    Tobacco Cessation  No Change    Warm-up and Cool-down  Performed as group-led instruction    Resistance Training Performed  Yes    VAD Patient?  No    PAD/SET Patient?  No      Pain Assessment   Currently in Pain?  No/denies    Multiple Pain Sites  No       Capillary Blood Glucose: No results found for this or any previous visit (from the past 24 hour(s)).    Social History   Tobacco Use  Smoking Status Former Smoker  . Packs/day: 0.00  . Years: 0.00  . Pack years: 0.00  . Types: Cigarettes  . Last attempt to quit: 10/01/1984  . Years since quitting: 33.9  Smokeless Tobacco Never Used    Goals Met:  Proper associated with RPD/PD & O2 Sat Exercise tolerated well  Goals Unmet:  Not Applicable  Comments: Service time is from 1030 to 1200    Dr. Rush Farmer is Medical Director for Pulmonary Rehab at [facility].

## 2018-09-22 ENCOUNTER — Encounter (HOSPITAL_COMMUNITY)
Admission: RE | Admit: 2018-09-22 | Discharge: 2018-09-22 | Disposition: A | Discharge status: Home / Self Care | Payer: Medicare Other | Source: Ambulatory Visit | Attending: Pulmonary Disease | Admitting: Pulmonary Disease

## 2018-09-22 DIAGNOSIS — J9611 Chronic respiratory failure with hypoxia: Secondary | ICD-10-CM | POA: Diagnosis not present

## 2018-09-22 DIAGNOSIS — J439 Emphysema, unspecified: Secondary | ICD-10-CM | POA: Diagnosis not present

## 2018-09-22 NOTE — Progress Notes (Signed)
Daily Session Note  Patient Details  Name: Andrew [name] MRN: [id number] Date of Birth: 1941-10-29 Referring Provider:     Pulmonary Rehab Walk Test from 07/09/2018 in [place]  Referring Provider  Dr. Lenna Gilford      Encounter Date: 09/22/2018  Check In:   Capillary Blood Glucose: No results found for this or any previous visit (from the past 24 hour(s)).    Social History   Tobacco Use  Smoking Status Former Smoker  . Packs/day: 0.00  . Years: 0.00  . Pack years: 0.00  . Types: Cigarettes  . Last attempt to quit: 10/01/1984  . Years since quitting: 33.9  Smokeless Tobacco Never Used    Goals Met:  Proper associated with RPD/PD & O2 Sat Exercise tolerated well  Goals Unmet:  Not Applicable  Comments: Service time is from 1030 to 1210    Dr. Rush Farmer is Medical Director for Pulmonary Rehab at [facility].

## 2018-09-24 ENCOUNTER — Encounter (HOSPITAL_COMMUNITY)
Admission: RE | Admit: 2018-09-24 | Discharge: 2018-09-24 | Disposition: A | Discharge status: Home / Self Care | Payer: Medicare Other | Source: Ambulatory Visit | Attending: Pulmonary Disease | Admitting: Pulmonary Disease

## 2018-09-24 DIAGNOSIS — J439 Emphysema, unspecified: Secondary | ICD-10-CM | POA: Diagnosis not present

## 2018-09-24 DIAGNOSIS — J9611 Chronic respiratory failure with hypoxia: Secondary | ICD-10-CM | POA: Diagnosis not present

## 2018-09-24 NOTE — Progress Notes (Signed)
Daily Session Note  Patient Details  Name: Andrew [name] MRN: [id number] Date of Birth: 1940/12/26 Referring Provider:     Pulmonary Rehab Walk Test from 07/09/2018 in [place]  Referring Provider  Dr. Lenna Gilford      Encounter Date: 09/24/2018  Check In: Session Check In - 09/24/18 1030      Check-In   Supervising physician immediately available to respond to emergencies  [facility] immediately available    Physician(s)  Dr. Starla Link    Location  [facility]    Staff Present  Rosebud Poles, RN, BSN;[name], RN, Bjorn Loser, MS, Exercise Physiologist;Annedrea Rosezella [place], RN, MHA    Medication changes reported      No    Fall or balance concerns reported     No    Tobacco Cessation  No Change    Warm-up and Cool-down  Performed as group-led instruction    Resistance Training Performed  Yes    VAD Patient?  No    PAD/SET Patient?  No      Pain Assessment   Currently in Pain?  No/denies    Multiple Pain Sites  No       Capillary Blood Glucose: No results found for this or any previous visit (from the past 24 hour(s)).    Social History   Tobacco Use  Smoking Status Former Smoker  . Packs/day: 0.00  . Years: 0.00  . Pack years: 0.00  . Types: Cigarettes  . Last attempt to quit: 10/01/1984  . Years since quitting: 34.0  Smokeless Tobacco Never Used    Goals Met:  Proper associated with RPD/PD & O2 Sat Exercise tolerated well  Goals Unmet:  Not Applicable  Comments: Service time is from 1030 to 1205    Dr. Rush Farmer is Medical Director for Pulmonary Rehab at [facility].

## 2018-09-28 ENCOUNTER — Other Ambulatory Visit (INDEPENDENT_AMBULATORY_CARE_PROVIDER_SITE_OTHER): Payer: Medicare Other

## 2018-09-28 ENCOUNTER — Encounter: Payer: Self-pay | Admitting: Pulmonary Disease

## 2018-09-28 ENCOUNTER — Ambulatory Visit (INDEPENDENT_AMBULATORY_CARE_PROVIDER_SITE_OTHER)
Admission: RE | Admit: 2018-09-28 | Discharge: 2018-09-28 | Disposition: A | Discharge status: Home / Self Care | Payer: Medicare Other | Source: Ambulatory Visit | Attending: Pulmonary Disease | Admitting: Pulmonary Disease

## 2018-09-28 ENCOUNTER — Ambulatory Visit (INDEPENDENT_AMBULATORY_CARE_PROVIDER_SITE_OTHER): Payer: Medicare Other | Admitting: Pulmonary Disease

## 2018-09-28 DIAGNOSIS — E8770 Fluid overload, unspecified: Secondary | ICD-10-CM | POA: Diagnosis not present

## 2018-09-28 DIAGNOSIS — D509 Iron deficiency anemia, unspecified: Secondary | ICD-10-CM | POA: Diagnosis not present

## 2018-09-28 DIAGNOSIS — J9611 Chronic respiratory failure with hypoxia: Secondary | ICD-10-CM | POA: Diagnosis not present

## 2018-09-28 DIAGNOSIS — D539 Nutritional anemia, unspecified: Secondary | ICD-10-CM | POA: Diagnosis not present

## 2018-09-28 DIAGNOSIS — G4733 Obstructive sleep apnea (adult) (pediatric): Secondary | ICD-10-CM

## 2018-09-28 DIAGNOSIS — I10 Essential (primary) hypertension: Secondary | ICD-10-CM

## 2018-09-28 DIAGNOSIS — Z8701 Personal history of pneumonia (recurrent): Secondary | ICD-10-CM | POA: Diagnosis not present

## 2018-09-28 DIAGNOSIS — J432 Centrilobular emphysema: Secondary | ICD-10-CM

## 2018-09-28 DIAGNOSIS — J439 Emphysema, unspecified: Secondary | ICD-10-CM | POA: Diagnosis not present

## 2018-09-28 DIAGNOSIS — Z9989 Dependence on other enabling machines and devices: Secondary | ICD-10-CM | POA: Diagnosis not present

## 2018-09-28 DIAGNOSIS — I509 Heart failure, unspecified: Secondary | ICD-10-CM

## 2018-09-28 DIAGNOSIS — I493 Ventricular premature depolarization: Secondary | ICD-10-CM

## 2018-09-28 DIAGNOSIS — Z8679 Personal history of other diseases of the circulatory system: Secondary | ICD-10-CM | POA: Diagnosis not present

## 2018-09-28 DIAGNOSIS — I480 Paroxysmal atrial fibrillation: Secondary | ICD-10-CM | POA: Diagnosis not present

## 2018-09-28 DIAGNOSIS — D500 Iron deficiency anemia secondary to blood loss (chronic): Secondary | ICD-10-CM | POA: Diagnosis not present

## 2018-09-28 LAB — CBC WITH DIFFERENTIAL/PLATELET
BASOS PCT: 0.1 % (ref 0.0–3.0)
Basophils Absolute: 0 10*3/uL (ref 0.0–0.1)
EOS PCT: 0.1 % (ref 0.0–5.0)
Eosinophils Absolute: 0 10*3/uL (ref 0.0–0.7)
HCT: 46.7 % (ref 39.0–52.0)
Hemoglobin: 15.6 g/dL (ref 13.0–17.0)
LYMPHS ABS: 0.5 10*3/uL — AB (ref 0.7–4.0)
Lymphocytes Relative: 5.5 % — ABNORMAL LOW (ref 12.0–46.0)
MCHC: 33.5 g/dL (ref 30.0–36.0)
MCV: 97.3 fl (ref 78.0–100.0)
MONO ABS: 0.4 10*3/uL (ref 0.1–1.0)
Monocytes Relative: 4.2 % (ref 3.0–12.0)
NEUTROS PCT: 90.1 % — AB (ref 43.0–77.0)
Neutro Abs: 8.3 10*3/uL — ABNORMAL HIGH (ref 1.4–7.7)
Platelets: 240 10*3/uL (ref 150.0–400.0)
RBC: 4.8 Mil/uL (ref 4.22–5.81)
RDW: 14.4 % (ref 11.5–15.5)
WBC: 9.2 10*3/uL (ref 4.0–10.5)

## 2018-09-28 LAB — SEDIMENTATION RATE: Sed Rate: 12 mm/hr (ref 0–20)

## 2018-09-28 LAB — COMPREHENSIVE METABOLIC PANEL
ALT: 18 U/L (ref 0–53)
AST: 15 U/L (ref 0–37)
Albumin: 4.6 g/dL (ref 3.5–5.2)
Alkaline Phosphatase: 68 U/L (ref 39–117)
BUN: 18 mg/dL (ref 6–23)
CO2: 27 meq/L (ref 19–32)
Calcium: 9.7 mg/dL (ref 8.4–10.5)
Chloride: 101 mEq/L (ref 96–112)
Creatinine, Ser: 1.09 mg/dL (ref 0.40–1.50)
GFR: 69.34 mL/min (ref >60.00)
GLUCOSE: 122 mg/dL — AB (ref 70–99)
POTASSIUM: 4.5 meq/L (ref 3.5–5.1)
Sodium: 139 mEq/L (ref 135–145)
Total Bilirubin: 0.6 mg/dL (ref 0.2–1.2)
Total Protein: 7.2 g/dL (ref 6.0–8.3)

## 2018-09-28 LAB — TSH: TSH: 0.77 u[IU]/mL (ref 0.35–4.50)

## 2018-09-28 LAB — BRAIN NATRIURETIC PEPTIDE: PRO B NATRI PEPTIDE: 23 pg/mL (ref 0.0–100.0)

## 2018-09-28 MED ORDER — PREDNISONE 10 MG PO TABS: 10.0000 mg | ORAL_TABLET | Freq: Every day | ORAL | 6 refills | Status: DC

## 2018-09-28 NOTE — Patient Instructions (Addendum)
Today we updated your med list in our EPIC system...    Continue your current medications the same...  We refilled the meds you requested...  Today we rechecked your CXR & blood work...    We will contact you w/ the results when available...   Keep up the good work w/ your exercise program...  We will arrange for a follow up appt w/ my young partner- DrIcard in Jan 2020...  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    Sending you my best wishes for good health & much happiness in the years to come.Marland KitchenMarland Kitchen

## 2018-09-28 NOTE — Progress Notes (Addendum)
Patient ID: Andrew [name], male   DOB: 09/12/1941, 77 y.o.   MRN: [id number]  HPI 77 y/o WM, an ex-smoker quit in 1986, w/ severe bullous emphysema & hx of RUL bullous resection in 2007; Hx both ?hypercarbic & +hypoxemic resp failure w/ cor pulmonale & secondary pulm HTN on Revatio x yrs; He has been stable for >10 yrs on the same pulm regimen as he has travelled around the [place] living in [place], [place], and [place] (he tells me that he went through a transplant education program in [place] in the late 2000's)...  ~  October 02, 2015:  Initial pulmonary evaluation by SN>  His PCP is Dr. Lujean Amel, [name]...       Andrew [name] is a 77 y/o gentleman from [place]- moved here to [place]moago to live w/ his daughter & son-in-law;  He has a long convoluted history & we have none of his prev objective data to review;  He tells me that he has known about COPD/Emphysema for >10 yrs and in 2007 he had right thoracotomy & "bleb-ectomy";  After this procedure he was placed on Oxygen at 2L/min and BiPAP to use at night, along w/ ADVAIR250Bid & SPIRIVA daily, plus REVATIO20Tid for pulmonary HTN;  He was also treated w/ NEBS for about 1-252yrthen this was discontinued;  He has pretty much been on this same regimen for the past 9 yrs w/o much change, despite or maybe because he has moved around a lot- [name], [place]...  He describes himself as being rock-solid stable on this exact regimen since 2007- he had Cath (?left & right heart) in 2007, told 1 blockage, good LVF ?right heart results, started on O2, BiPAP, and Revatio but he does not know why?  He notes min cough when supine & in early AM attributed to reflux; min if any sput production, no hemoptysis, he denies SOB but states DOE "if I over-exert" eg- walking, lifting/carrying, stairs, etc; he notes that ADLs  are ok- no problem (he is stoic);  He denies CP, palpit, f/c/s, edema... He hasn't been to an ER since 2007 he says & that was also the last time he had any Pred; he thinks he had CXR, PFT, 2DEcho all earlier this yr...   Smoking Hx>  He is an ex-smoker, started in his teens, smoked for 30 yrs up to 1ppd, quit in 1986; This is a 30 pack-yr hx, he does not recall ever being checked for A1AT defic...  Pulmonary Hx>  COPD/ Emphysema w/ right upper [place] "[place] hypox[place] resp failure on O2 at 2L/min since 2007;  He tells me that he was started on BiPAP about that same time but he doesn't know why- never had sleep study, not on CPAP prev, he does not know about pCO2 levels etc ("I like the fresh cool air");  [place] not [place] the settings, machine never downloaded, etc;  He has also been on Revatio20Tid since 2007, apparently never tried on other meds, dose never adjusted- he knows about "pulmonary hypertension" but he doesn't know any details and it doesn't appear to have been followed up, and meds kept the same from doctor to doctor... we do not have old records, ?prev A1AT  testing?   Medical Hx>  HBP, ?nonobstructive CAD, HL, thyroid nodule, GERD, constipation, BPH, insomnia...  Family Hx>  Father died w/ Emphysema & was a former smoker; no other hx lung dis in the family; Alpha-1 status is unknown...  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   Current Meds>  Oxygen 2L/min pulse-dose concentrator, Advair250Bid, Spiriva daily, Revatio20Tid, CardizemCD240, Crestor10, Nexium20, Proscar5, Restoril30...  EXAM shows Afeb, VSS, O2sat=93% on 2L/min pulse-dose;  Heent- neg, mallampati1;  Chest- decr BS at bases, can't augment BS  voluntarily, w/o w/r/r;  Heart- RR w/o m/r/g;  Abd- soft, neg;  Ext- neg w/o c/c/e;  Neuro- intact...  CXR 10/02/15 showed norm heart size, COPD, bullous emphysema/ hyperinflation, scarring right apex, NAD...   Spirometry 10/02/15 showed FVC=2.70 (69%), FEV1=1.12 (37%), %1sec=41, mid-flows reduced at 18% predicted; this is c/w severe airflow obstruction & GOLD Stage 3 COPD  Ambulatory oxygen saturation test 10/02/15> on O2 at 2L/min pulse-dose concentrator: O2sat=96% on 2L at rest w/ pulse=87; he walked 2 laps w/ his O2, stopped due to dyspnea, lowest O2sat=89% w/ pulse=113/min...  LAB 10/02/15>  Alpha-1-Antitrypsin level => pending (he never went to the lab for this blood test)  2DEchocardiogram 10/09/15 showed norm LVF w/ EF=55-60%, norm wall motion, mild MR, mild RA dil, PAsys est 38mHg... Pt on Revatio 20Tid x 954yr& I rec we wean slowly (Decr to Bid now)...    IMP >>     COPD/ bullous emphysema> s/p RUL "bleb-ectomy" in 2007, severe airflow obstruction w/ GOLD Stage3 COPD> on Advair250Bid & Spiriva daily; apparently he has no use for a rescue inhaler; prev on NEB w/ Albut but not for several yrs.     Chronic hypoxemic respiratory failure on O2 at 2L/min via pulse-dose concentrator...    Pt reports using BiPAP since 2007, never been on CPAP, never had sleep study he says, unknown ABGs or pCO2 data; machine from [place] will try to get the old data => none received.    Hx pulmonary hypertension on Revatio20Tid since 2007 w/o additional med trials or dose adjustments> we do not have any of the objective data from his prev physician teams... Current 2DEcho w/ PAsys est 3647m & we will wean the Revatio to Bid at this point => he does not want to wean further for "other" reasons...    Medical issues include:  HBP, ?nonobstructive CAD, HL, thyroid nodule, GERD, BPH, insomnia... PLAN >>     MarSegers a distinctly patchy history to go along w/ his severe airflow obstruction & bullous  emphysema;  We really need his old objective data from 2007 when he was started on O2 & BiPAP after his RUL bleb reduction surg;  He will try to get names and numbers for us-[place] the meanwhile we [facility] data as [place] that he wants us [place] continue his current regimen which has served him well over the last 56yr55yrContinue Advair250Bid, Spiriva daily, Revatio20Tid=>Bid, O2 at 2L/min, and the BiPAP nightly as currently set... We plan ROV recheck in 1 month... NOTE> 2DEcho w/ PAsys est ~36mm66m we will slowly wean Revatio...  ~  November 01, 2015:  61mo R78mo  MrBowman reports stable, doing satis & notes no untoward effects from cutting the Revatio to 8mBid; he denies CP, palpit, incr SOB, edema, etc; he continues on the O2 at 2L/min, [place]; we have not received any records from his mult physicians ([place]...    EXAM shows Afeb, VSS, O2sat=92% on 2L/min pulse-dose;  Heent- neg, mallampati1;  Chest- decr BS at bases, can't augment BS voluntarily, w/o w/r/r;  Heart- RR w/o m/r/g;  Abd- soft, neg;  Ext- neg w/o c/c/e;  Neuro- intact... IMP/PLAN>>  See prob list above- he is stable on this regimen but does not want to wean the Revatio further for "other" reasons; OK to continue current med regimen- advised regular exercise vs pulm rehab program; given ZPak for prn use over the winter & he knows to call for any resp issues, incr dyspnea, etc... He remains on BiPAP but we do not have any data- no records received from prev pulm physicians, [place] no download data from his machine- we will again try to make contact w/ his DME company... We plan ROV in 3-453mo ~  Mar 18, 2016:  4-39m54moV & post hosp visit>  MarTimathys severe COPD/Emphysema, GOLD Stage 3 w/ FEV1=1.12L (37%predicted) in [id number] right  thoracotomy & "bleb-ectomy" in 2007;  after this procedure he says he was placed on Oxygen at 2L/min and BiPAP to use at night, along w/ ADVAIR250Bid, SPIRIVA daily, and REVATIO20Tid for pulmonary HTN (2DEcho here 11/16 showed PAsys est=51m61m-- we [place] of [place] been unsuccessful in obtaining old data from any of his prev physicians); he has been resistent to any adjustment in his medication regimen for various reasons...     He's [facility] [facility] [place]> [facility]16 by [facility] w/ CAP- CXR showed severe bullous emphysema, incr markings in RLL and atelectasis in R-mid lung, Temp 103, Lactate=2.4, WBC 16K; treated w/ O2, Solumedrol=>Pred, Zosyn/Vanco=>Levaquin, NEBs, etc; disch home w/ home health help- ?seen by his PCP after disch...    [facility] w/ 1d hx incr SOB, wheezing, productive cough & felt to have a COPD exac; CXR showed his COPE/E, persistent incr markings in right base, WBC was elev at 20K, BNP=60, no pos cultures; he was treaed w/ O2, Solumedrol, Roceph/Zithro, NEBs, etc; he was disch on Levaquin & Pred; he developed urinary retention & foley placed (weaned off in NH); he was debilitated & sent to NH for rehab...     [facility] by [facility] dated 03/01/16 reviewed-- finished Levaquin, weaned off the Pred, they were able to discontinue the foley & he passed voiding trial; disch home after 19d in rehab...     Now back home on same O2= 2L/min days & 3L/min night w/ BiPAP ?settings (he has been on this for yrs and never re-assessed), NEB w/ AlbutTid, Advair250Bid, Spiriva daily, Revatio20Bid (pt refuses to taper this med further);  He has developed pedal edema x3d & needs a low sodium diet + diuretic started but he tells me he is sched to see his PCP soon for this problem...    EXAM shows Afeb, VSS, O2sat=95% on 2L/min pulse-dose;  Heent- neg, mallampati1;  Chest- decr BS at  bases, can't augment BS voluntarily, w/o w/r/r;  Heart- RR w/o m/r/g;  Abd- soft, neg;  Ext- neg w/o c/c/e;  Neuro- intact...  CXR 02/11/16 showed hyperinflation, bullous emphysema, some scarring in right mid lung & both lower lobes, no infiltrate, no edema...  LABS 01/2016> all reviewed in Epic... IMP/PLAN>>  Leith has had a protracted resp exac- triggered by prob RLL pneumonia (NOS) superimposed on his severe COPD/bullous emphysema;  Rec to change the Albut for Neb to [place] & continue treatments Tid; continue other meds regularly as outlined;  He is referred to [facility] & hopes to start soon;  We will plan ROV in 82mosooner if needed.  ~  June 20, 2016:  348moOV w/ SN>  Andrew [name] returns for a 8m57moV & states that he is doing very well- no new complaints or concerns at this time, his PCP is DrKoirala; [facility] & continues in this program at present; Pt prev inquired about treatments at "[facility] [place] regenerative lung tissue (stem cell therapy) which costs ~$10K per treatment & all benefits are antecdotal; I [facility] if he wants cutting edge research approach...     EPIC records indicate ER visit 05/08/16 for Abd Pain> VSS, exam was neg, CT Abd showed large fecal burden otherw neg, distended urinary bladder, atherosclerosis, bilat L5 pars defects; Rec to take laxatives...     Severe COPD- GOLD Stage3, bullous emphysema, s/p resection of RUL bleb in 2007> on Advair250Bid & Spiriva daily; he has NEB w/ Duoneb for prn use; Spirometry 09/2015 w/ FEV1=1.12 (37%); he is enrolled in PulOhiowants a POC instead of tanks;     Chronic hypoxemic respiratory failure on O2 at 2L/min via POC> ambulatory O2sats 09/2015 dropped to 89% on 2L/min after 2 Laps.    Hx prob hypercarbic resp failure inferred from Pt report of BiPAP ([facility]) since 2007, never been on CPAP, never had sleep study, unknown ABGs or pCO2 data>    Hx cor pulmonale/  pulmonary hypertension on Revatio20Tid since 2007 w/o additional med trials or dose adjustments> we do not have any of the objective data from his prev physician teams; 2DEcho here 09/2015 showed PAsys=36 and we tried to wean his Revatio but he refused due to "other reasons", finally compromised on Revatio20Bid...    Hx CAP- Hosp 12/2015 w/ RLL opac (nos) & responded to broad spectrum Ab coverage + Sulomedrol, O2, Nebs, etc; readmitted 01/2016 w/ COPD exac- similar Rx but sent to NH for rehab at disch...    Cardiac issues>  ?nonobstructive CAD, cor pulmonale w/ mild pulmHTN & 2DEcho 09/2015 showing norm LVF w/ EF=55-60%, norm wall motion, mild MR, mild RA dil, PAsys est 10m32m..    Medical issues include:  HBP (on CardizemCD240), HL (on Cres10), hx thyroid nodule, GERD (on Nexium40), Constipation, BPH (on Proscar5 & Flomax0.4) w/ hx urinary retention, insomnia (on Restoril30)... EXAM shows Afeb, VSS, O2sat=93% on 2L/min pulse-dose;  Heent- neg, mallampati1;  Chest- decr BS at bases, can't augment BS voluntarily, w/o w/r/r;  Heart- RR w/o m/r/g;  Abd- soft, neg;  Ext- neg w/o c/c/e;  Neuro- intact...  CXR 05/08/16>  Mod bullous emphysema w/ chr changes at the lung bases w/ pleuroparenchymal scarring, surg suture lines ove the right mid lung...  LABS 04/2016 in epic> Chems- ok, BS=149;  CBC- anemia w/ Hg= 10.4-11.7, WBC=15K IMP/PLAN>>  Andrew [name] is stable on his baseline regimen + pulm rehab, etc; rec to continue current meds and exercise; he needs the 2017 Flu vaccine when  avail & will call prn any breathing problems; we plan ROV in 73mo..   ADDENDUM>>  Pt Hosp by [facility] 8/11 - 07/01/16 w/ CAP after presenting w/ incr SOB, decr O2sats at pulm rehab, & chills 9he lives w/ school aged grandkids)- CXR showed ?RLL opac, cultures neg & NOS- treated empirically w/ Rocephin & Zithromax, Solumedrol, O2, NEBS, etc; he improved gradually & disch home;  He went to see ENT 8/29 because he thought the pneumonia may have been  from reflux & dysphagia- fiberoptic exam revealed some edema & pooling of secretions c/w LPR=> placed on PPI Bid and referred to GI;  Barium esophagram showed a s[place]& mod GERD but no esophagitis mass or stricture;  He saw DrSchooler & had EGD 08/23/16 showing a s[place] patchy candida=> treated w/ Nystatin, later required Diflucan...   ADDENDUM>>  Pt had colonoscopy 08/23/16 by DrSchooler-- small HH, patchy candida, mild gastitis seen;  Treated w/ Nystatin, continue Prilosec40/d...   ~  November 25, 2016:  574moOV w/ SN>  Andrew [name] returns c/o 3d hx cough, lots of clear whitish sput, increased weakness and SOB "even on my oxygen"; symptoms started w/ a slight sore throat, hoarse, cough as noted but no f/c/s; he has been going to the Gym 5d per week to continue his exercise since graduating from the formal pulm rehab program (finished 07/2016) & is doing very well by his estimate (until this URI); he notes that 3d ago he had his usual 2H work out, went to thWellsite geologistthen went to lunch & did all this w/o difficulty...      Severe COPD- GOLD Stage3, bullous emphysema, s/p resection of RUL bleb in 2007> on Advair250Bid & Spiriva daily; he has NEB w/ Duoneb for prn use; Spirometry 09/2015 w/ FEV1=1.12 (37%); he finished his PulmRehab 07/2016 & now exercises 5d/wk at the gym; he finally got his POC that he wanted...    Chronic hypoxemic respiratory failure on O2 at 2L/min via POC> ambulatory O2sats 09/2015 dropped to 89% on 2L/min after 2 Laps; repeat 11/2016 dropped to 85%after 1Lap...    Hx prob hypercarbic resp failure inferred from Pt report of BiPAP ([facility]) since 2007, never been on CPAP, never had sleep study, unknown ABGs or pCO2 data> we never received records from former physician teams or any of his old records...     Hx cor pulmonale/ pulmonary hypertension on Revatio20Tid since 2007 w/o additional med trials or dose adjustments> we do not have any of the objective data from his prev physician teams; 2DEcho  here 09/2015 showed PAsys=36 and we tried to wean his Revatio but he refused due to "other reasons", finally compromised on Revatio20Bid...    Hx CAP- Hosp 12/2015 w/ RLL opac (nos) & responded to broad spectrum Ab coverage + Solumedrol, O2, Nebs, etc; readmitted 01/2016 w/ COPD exac- similar Rx but sent to NH for rehab at disch...    Cardiac issues>  ?nonobstructive CAD, cor pulmonale w/ mild pulmHTN & 2DEcho 09/2015 showing norm LVF w/ EF=55-60%, norm wall motion, mild MR, mild RA dil, PAsys est 3651m...    Medical issues include:  HBP (on CardizemCD240), HL (on Cres10), hx thyroid nodule, GERD & prob LPR (on Nexium40), Constipation, BPH (on Proscar5 & Flomax0.4) w/ hx urinary retention, insomnia (on Restoril30)...       NOTE> pt is Iron deficient (see below) & needs to f/u w/ his PCP & GI for this...  EXAM shows Afeb, VSS, O2sat=92% on 3L/min;  Heent- neg, mallampati1;  Chest- decr BS at bases, can't augment BS voluntarily, w/o w/r/r;  Heart- RR w/o m/r/g;  Abd- soft, neg;  Ext- neg w/o c/c/e;  Neuro- intact...  CXR 06/28/16>  Hyperinflation, marked emphysematous changes, crowed lung markings at the bases, no consolidation or effusions...  CXR 11/25/16>  COPD, bullous emphysema, and bilat pleuroparenchymal changes c/w scarring, no acute abnormality  Ambulatory O2sat on 3L/min pulse-dose POC>  O2sat=98% on 3L/min pulse-dose at rest w/ HR=90/min;  He ambulated only 1Lap (185') w/ O2sat drop to 85% w/ HR=108/min...  LABS 11/25/16>  Chems- wnl x BS=108;  A1c=6.1;  CBC- Hg=12.3, mcv=90, WBC=7.8K;  Fe=31 (6.6%sat) & Ferritin=8.7;  B12=661 IMP/PLAN>>  There is no such thing as a mild exac for MrBowman- even the mildest of URIs can cause a severe COPD exac- we discussed Rx w/ Levaquin500 x7d, Pred52m- 5d taper (see AVS), + Diflucan100 per his request; hopefully he will respond but he has little in the way of reversible factors given his severe emphysema; [facility] if symptoms  worsen despite therapy; as noted he is Fe defic & needs further eval per his PCP & GI- copies sent...  ~  December 23, 2016:  124moOV & post hospital check>  [name] by TrDiona Browner/ a COPD exac w/ acute on chronic resp failure;  Pt already on Home O2 at 2L/min and BiPAP Qhs; he had a root canal done, developed a sore throat, then cough, etc; Levaquin & Pred called -in for pt, Hosp for 4d w/ IV solumedrol & ultimately disch on Pred10... Currently c/o DOE w/ ADLs which is new to him as prev he was going to the gym etc...     He is currently taking Duoneb Q6H as needed + Advair250Bid, Spiriva via handihaler daily, Pred 1069mabs- slow taper... EXAM shows Afeb, VSS, O2sat=94% on 2L/min;  Heent- neg, mallampati1;  Chest- decr BS at bases, can't augment BS voluntarily, w/o w/r/r;  Heart- RR w/o m/r/g;  Abd- soft, neg;  Ext- neg w/o c/c/e;  Neuro- intact...  CXR 12/07/16 (independently reviewed by me in the PACS system)> norm heart size, calcif Ao, severe COPD w/ apical bullous changes & surg staples on right, NAD... Marland KitchenMarland KitchenKG 12/07/16 showed NSR, rate 87, poor R prog V1-2, otherw WNL... Marland KitchenMarland KitchenABS 11/2016> NOT retaining CO2 (ABG w/ pCO2=39;  Chems- ok;  CBC- ok w/ Hg=11-12 and wbc=21=>12;  BNP= wnl... IMP/PLAN>>  Andrew [name] had a COPD exac & required IV Solumedrol + in hosp attention w/ NEBS etc & now he is approaching his baseline; he called for an order for a Hosp bed but was referred to his PCP for this determination;  We reviewed the NEED for DUONEB Tid followed by Advair250Bid & Spiriva once daily; we will wean his Pred from 4m66mto 5mg/71m plan ROV receheck in 6wks...  NOTE: >50% of this 25min19m was spent in counseling & coordination of care...  ~  January 28, 2017:  6wk ROV & Savageable on meds + Pred 5mg/d;75me called 01/15/17 for antibiotics for an infected tooth- called in Augmentin & tooth has been extracted by dentist;  He remains on NEBs w/ Duoneb Tid, followed by Advair250Bid and  Spiriva daily;  He reports using his Revatio "prn";  His breathing continues to improve, back in the gym 3d/wk, approaching his baseline he says;  Denies cough, sput, hemoptysis, CP, etc... We reviewed his prob list as  above...  EXAM shows Afeb, VSS, O2sat=96% on 2L/min;  Heent- neg, mallampati1;  Chest- decr BS at bases, can't augment BS voluntarily, w/o w/r/r;  Heart- RR w/o m/r/g;  Abd- soft, neg;  Ext- neg w/o c/c/e;  Neuro- intact... IMP/PLAN>>  Andrew [name] continues to improve & move toward his baseline; Rec to continue Pred19m/d thru March then decr to 548mQod til return visit in 63m62mo  ~  May 01, 2017:  669mo39mo & Andrew [name] reports a good 669mo 14morval w/o acute exac and w/o new complaints or concerns; he is back in the gym 3d/wk & back to baseline he says;  As prev noted Andrew [name] is on BiPAP & has been for yrs- [place] & I presume this was done for COPD & CO2 retention ant NOT because of sleep apnea; he does not know his settings 7 we have been unable to get old records indicating his initial parameters or follow up monitoring; cureent problem is that his machine is old, mask is 69yr o67yrno tubing supplies in a long time;  For his part he says he does NOT rest well, just in short intervals;  He goes to bed at 11PM using his BiPAP; wakes at 3-4AM & can't get back to sleep; maybe eats breakfast at 4AM and drifts back to sleep (w/o BiPAP) until 7AM or so; similarly he might nap for 1H during the day (w/o BiPAP); he currently thinks that the BiPAP makes no difference in his sleep & it would just as well suit him to stop it if able...  WE DISCUSSED REASSESSMENT w/ ABG on RA, and a HOME SLEEP TEST on RA to evaluate for sleep apnea and hypercarbic/ hypoxemic resp failure...     [facility] does his Home O2 (2L/min continuous) and [facility] does his nursing care    Andrew [name] also tells me he had a suspicious mole removed from his left post shoulder ~80mo ag85mox= melanoma, then wider excision by  DERM- DSherre Lain Skin [place] Severe COPD- GOLD Stage3, bullous emphysema, s/p resection of RUL bleb in 2007> on Advair250Bid & Spiriva daily; he has NEB w/ Duoneb for prn use; he is off PRED now; Spirometry 09/2015 w/ FEV1=1.12 (37%); he finished his PulmRehab 07/2016 & now exercises regularly at the gym...    Chronic hypoxemic respiratory failure on O2 at 2L/min via POC> ambulatory O2sats 09/2015 dropped to 89% on 2L/min after 2 Laps; repeat 11/2016 dropped to 85% after 1Lap...    Hx prob hypercarbic resp failure inferred from Pt report of BiPAP ([facility]) since 2007, never been on CPAP, never had sleep study> we never received records from former physician teams or any of his old records; ABGs in EPIC> [facility] pH=7.44/ pCO2=35/ pO2=69 ?on RA; 11/2016 pH=7.44/ pCO2=39/ pO2=146 on 4Lnc;   => he has agreed to re-assessment here 2018 w/ ABG on RA and Home Sleep Study => pending...    Hx cor pulmonale/ pulmonary hypertension on Revatio20Tid since 2007 w/o additional med trials or dose adjustments> we do not have any of the objective data from his prev physician teams; 2DEcho here 09/2015 showed PAsys=36 and we tried to wean his Revatio but he refused due to "other reasons", finally compromised on Revatio20Bid...    Hx CAP- Hosp 12/2015 w/ RLL opac (nos) & responded to broad spectrum Ab coverage + Solumedrol, O2, Nebs, etc; readmitted 01/2016 w/ COPD exac- similar Rx but sent to NH for rehab at  disch...    Cardiac issues> no cardiologist> ?nonobstructive CAD, cor pulmonale w/ mild pulmHTN & 2DEcho 09/2015 showing norm LVF w/ EF=55-60%, norm wall motion, mild MR, mild RA dil, PAsys est 73mHg...    Medical issues> PCP= Dr. DLauretta GrillKoirala ([facility])>  HBP (on CardizemCD240), HL (on Cres10), hx thyroid nodule, GERD & prob LPR (GI= DrSchooler, on Prilosec40), Constipation, BPH (on Proscar5 & Flomax0.4) w/ hx urinary retention, insomnia (on Restoril30)...       NOTE> pt is Iron deficient & needs to  f/u w/ his PCP & GI for this>> LABS 11/25/16>  Chems- wnl x BS=108;  A1c=6.1;  CBC- Hg=12.3, mcv=90, WBC=7.8K;  Fe=31 (6.6%sat) & Ferritin=8.7;  B12=661 EXAM shows Afeb, VSS, O2sat=90% on 2L/min pulse dose;  Heent- neg, mallampati1;  Chest- decr BS at bases, can't augment BS voluntarily, w/o w/r/r;  Heart- RR w/o m/r/g;  Abd- soft, neg;  Ext- neg w/o c/c/e;  Neuro- intact...  ABG on RA> pending (not done)  Home Sleep Test> pending (see below) IMP/PLAN>>  Andrew [name] has agreed to proceed w/ home sleep test to r/o OSA and we will further assess w/ ABG on RA to check for CO2 retention and any indication for BiPAP (doubt he will qualify at this time);  He will continue w/ his O2, Advair250, Spiriva, Duoneb vs ProventilHFA which he uses prn;  He does not appear to need the Revatio either but declines to discontinue this medication for other reasons.  ADDENDUM>>  Home Sleep Test done 05/20/17>  4H study showed mild OSA w/ AHI=6.0/hr;  Study done on RA-- lowest O2sat was 74% w/ an ave of 87%;  Pt given the option for in-lab CPAP titration study vs trial CPAP w/ autoset & a mask fit session w/ VLynnae Sandhoff  He prefers the latter & states he doesn't believe that he has OSA => we will arrange a mask fit session, the order a ResMed S10, air/ auto- set 5/15, w/ heated humidity, cliamate controlled tubing, enroll in airview w/ ROV in 6-8wks after starting for face to face and download... NOTE: he had the mask fit session w/ new FFM & O2 bleed-in at 3L/min; he was not willing to try the CPAP on Auto, says he requires BiPAP & therefore will need an in-lab CPAP/BiPAP titration test...  ~  July 31, 2017:  367moOV & Andrew [name] had a Home Sleep Test 05/20/17- as above w/ AHI=6/hr & O2desat to 74% w/ ave 87%;  As noted he has been on BiPAP x yrs [place] to obtain any old records relating to his prev Dx of OSA & his need for BiPAP;  It is indeed unfortunate that he did not travel w/ [place] old  [place] that he was INTOL to CPAP when tried in the past;  The prob is that his old machine is wearing out & he is in need of a new machine- he is unable to afford one on his own;  I explained Medicare's rules for CPAP/ BiPAP & how he must follow their edicts about the repeat eval, and eventually the requirement for a download to prove compliance & efficacy-- in essence this means that he MUST have an in lab CPAP/BiPAP titration test, prove INTOL to CPAP to get the BiPAP then determine the BiPAP settings best for him;  This process is followed by a  face to face visit in ~9mofor download purposes...    NOTE: his PCP is DrKoirala (Programmer, multimedia & his GI is DSystems developer([facility]); [facility] c/o worsening dysphagia & nasal congestion, long hx GERD on PPI therapy, larynx exam showed polypoid changes of the cords, prom cricopharyngeus, and postglottic edema; prev EGD w/ smallHH, mod GERD; repeat laryngoscopy was essent neg- they ordered a Ba swallow, done 06/30/17 & showed sl retention in the valleculae that cleared w/ repeat swallowing, 1444mbarium tablet got lodged in the distal esoph & did not pass into the stomach; He again needs attention from his gastroenterologist for dilatation...  EXAM shows Afeb, VSS, O2sat=90% on 2L/min pulse dose;  Heent- neg, mallampati1;  Chest- decr BS at bases, can't augment BS voluntarily, w/o w/r/r;  Heart- RR w/o m/r/g;  Abd- soft, neg;  Ext- neg w/o c/c/e;  Neuro- intact... IMP/PLAN>>  As explained above we will order an in-lab CPAP/BiPAP titration study, and proceed from there...  ADDENDUM>>  Sleep Lab Titration study 10.18.18>  Optimal pressure= 7cmH2O w/ AHI at optimal pressure= 0/hr;  The study lasted 6.5H, no cardiac arrhythmias, no leg movements, he had O2desat lasting longer than 44m8mdespite control in his sleep apnea w/ CPAP and 1L/min  O2 was added to his CPAP;  He uses a small [employer] full face mask & heated humidification...  ~  September 22, 2017:  12mo34mo & post- hosp check>  [name]/ 4d hx incr cough, green sput, temp to 102 & incr SOB; ER eval revealed right basilar pneumonia, WBC=14.2, & O2sat=87% on 2L/min by [place];  He was treated w/ Zithromax & Rocephin, Solumedrol, Nebs, etc;  Cultures and serologies were all non-revealing & he was disch on Keflex, Pred, Nebs (Xopenex & Ipratripium), etc;  He also had PAF which converted spont & he was seen by [facility] and disch on Eliquis, Cardizem, Metoprolol... [facility] We reviewed the following medical problems during today's office visit>    Severe COPD- GOLD Stage3, bullous emphysema, s/p resection of RUL bleb in 2007> on Advair250Bid & Spiriva daily; he has NEB w/  Xopenex & Atrovent for prn use; he is off PRED now; Spirometry 09/2015 w/ FEV1=1.12 (37%); he finished his PulmRehab 07/2016 & now exercises regularly at the gym => we re-started Pred 20mg244mering sched down to 1/2 Qod by return visit...    Chronic hypoxemic respiratory failure on O2 at 2L/min via POC> ambulatory O2sats 09/2015 dropped to 89% on 2L/min after 2 Laps; repeat 11/2016 dropped to 85% after 1Lap... He uses O2 at 1L/min Qhs and 2L/min w/ activ.    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    Hx cor pulmonale/ pulmonary hypertension on Revatio20Tid since 2007 w/o additional med trials or dose adjustments> we do not have any of the objective data from his prev physician teams; 2DEcho here 09/2015 showed PAsys=36 and  we tried to wean his Revatio but he refused due to "other reasons", finally compromised on Revatio20Bid...    Hx CAP- Hosp [facility] & 10/18  w/ RLL opac (nos) & responded to broad spectrum Ab coverage + Solumedrol, O2, Nebs, etc; readmitted 01/2016 w/ COPD exac- similar Rx but sent to NH for rehab at disch...    Cardiac issues> ?nonobstructive CAD, cor pulmonale w/ mild pulmHTN & 2DEcho 09/2015 showing norm LVF w/ EF=55-60%, norm wall motion, mild MR, mild RA dil, PAsys est 52mHg...episode PAP 10/18 & converted spont- disch from hosp on Eliquis, Metoprolol, Cardizem & f/u w/ [facility]-DrRandolph pending...    Medical issues> PCP= Dr. DLauretta GrillKoirala ([facility])>  HBP (on CardizemCD240), HL (on Cres10), hx thyroid nodule, GERD & prob LPR (GI= DrSchooler, on Prilosec40), Constipation, BPH (on Proscar5 & Flomax0.4) w/ hx urinary retention, anemia & insomnia (on Restoril30)... EXAM shows Afeb, VSS, O2sat=90% on 2L/min pulse dose;  Heent- neg, mallampati1;  Chest- decr BS at bases, can't augment BS voluntarily, w/o w/r/r;  Heart- RR w/o m/r/g;  Abd- soft, neg;  Ext- neg w/o c/c/e;  Neuro- intact...  CXR 09/09/17>  Norm heart size, interval patchy opac & effusion at right base, underlying COPD...  2DEcho 09/10/17>  Norm LV size & function w/ EF=55-60%, no regional wall motion abn, valves are OK, PAsys=390mg  Mod barium swallow 09/10/17> mild pharyngeal dysphagia due to known esoph issues, mild asp risk, swallowing strategies outlined to pt...  LABS in Epic 08/2017>  Chems- ok BS~130, Cr=0.81, Alb=2.7, sl elev LFTs noted;  CBC- anemic w/ Hg=10.9;  TSH=0.52...   CXR 09/22/17 (independently reviewed by me in the PACS system) showed norm heart size, underlying COPD/emphysema, near complete clearing of the right basilar infiltrate... IMP/PLAN>>  We decided to continue his low dose Pred rx w/ 52m18mod;  Continue other meds as prev including NEBs w/ Duoneb Tid followed by Advair250Bid & Spiriva once daily, Mucinex  1-2Bid, Tessalon tid as needed... We plan rov recheck in 6 wks...  ~  November 04, 2017:  [place] and approaching his baseline- still w/ SOB/DOE but ADLs improved, cough/ phlegm stable, no CP/ edema; on Duoneb tid, Advair250Bid & spiriva daily; on Mucinex, MMW, Tessalon as needed; Pred weaned to 1/2 Qod;  ?hold-up from LinBuckmanr his new ResMed S10 CPAP set up as above...     He saw [facility]-DrRandolph 09/22/17>  HBP, PAF, HL;  On Eliquis, Metoprolol, Cardizem; Thyroid wnl & Echo w/ EF=55-60%, PAsys=33; felt to be stable- no changes made...     We reviewed his pulm issues/ medical problems during today's office visit-- see above... we do not have notes/ data from [facility]... EXAM shows Afeb, VSS, O2sat=96% on 2L/min pulse dose;  Heent- neg, mallampati1;  Chest- decr BS at bases, can't augment BS voluntarily, w/o w/r/r;  Heart- RR w/o m/r/g;  Abd- soft, neg;  Ext- neg w/o c/c/e;  Neuro- intact... IMP/PLAN>>  We discussed slowly weaning the Pred down to 52mg70md & continue this til ROV in 67mo;60mo will watch BP/ pulse state w/ an eye towards weaning the BBlocker if able;  His appetite is improved, wt up 7#, and back in the gym daily!  ~  December 16, 2017:  6wk ROV & add-on appt requested for cough, yellow sput, dyspnea>  Andrew [name] weaned the Pred to 52mg Q104m& then noticed grad increase in symptoms- cough w/ yellow sput, incr SOB w/ min exertion, etc;  He had ret to the gym & was doing satis until this deterioration;  He uses his CPAP Qhs- rests well, wakes feeling rested, denies daytime hypersomnolence but  still naps 2-3/7, states driving is ok w/o drowsiness etc...     Severe COPD/ emphysema w/ bullous dis & prev RUL bleb resected 2007> on [id number] & Spiriva daily + Xopenex&Atrovent via NEB prn, and Pred5Qod...    Chr hypoxemic resp failure on O2 at 2L/min via POC...    OSA- on CPAP7 & 1L/min O2 bleed-in.Marland KitchenMarland Kitchen Download 12/30-1/28/19 showed good compliance (30/30d, ave 7+H  per night, on CPAP7 w/ min intermit leak, & good efficacy w/ AHI=1.4.Marland KitchenMarland Kitchen     Hx cor pulmonale/ pulmonary hypertension on Revatio20Tid since 2007 w/o additional med trials or dose adjustments=> we cut him to bid w/o any untoward effect but he refused to wean further... EXAM shows Afeb, VSS, O2sat=90% on 2L/min pulse dose;  Heent- neg, mallampati1;  Chest- decr BS at bases, can't augment BS voluntarily, w/o w/r/r;  Heart- RR w/o m/r/g;  Abd- soft, neg;  Ext- neg w/o c/c/e;  Neuro- intact... IMP/PLAN>>  We decided to treat his COPD exac w/ DOXY 172m Bid, and an incr Pred 515mtabs to 2tabs Qam for 1wk, then taper to 2-1 Qod, then 2-0 Qod til return;  Asked to continue NEBS Tid followed by Advair-Bid & Spiriva-Qd, + Mucinex600Bid, etc...   ~  February 02, 2018:  6wk ROV & post hospital follow up>  Andrew [name] was HOSP 3/2 - 01/21/18 by [facility] w/ a COPD exacerbation- presented w/ productive cough, wheezing, incr SOB, feeling bad w/ low grade fever; he was on Pred1067md, NEBS w/ Duoneb Tid, Advair250Bid, SpirivaQd, Mucinex, Fluids, etc=> in Hosp eval revealed no acute CXR changes, Hg=11.2=>9.0, WBC=12.5, Chems- ok, cultures all neg (Flu neg, Legionella neg, Pneumococcal neg); he had been exposed to Flu & covered w/ Tamiflu rx; given Rocephin/ Zithromax/ IV Solumedrol=> disch on Pred taper...  We reviewed the following medical problems during today's office visit>    Severe COPD- GOLD Stage3, bullous emphysema, s/p resection of RUL bleb in 2007> on Pred 15m79m Advair250Bid & Spiriva daily; he has NEB w/ Duoneb to use Tid; Spirometry 09/2015 w/ FEV1=1.12 (37%); he finished his PulmRehab 07/2016 & now exercises regularly at the gym =>     Chronic hypoxemic respiratory failure on O2 at 2L/min via POC> ambulatory O2sats dropped on RA & he uses O2 at 1L/min Qhs and 2L/min w/ activ...    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     Hx cor pulmonale/ pulmonary hypertension on Revatio20Tid since 2007 w/o additional med trials or dose adjustments> we do not have any of the objective data from his prev physician teams; 2DEcho here 09/2015 showed PAsys=36 and we tried to wean his Revatio but he refused due to "other reasons", finally compromised on Revatio20Bid...    Hx CAP- Hosp 2/17 & 10/18 w/ RLL opac (nos) & responded to broad spectrum Ab coverage + Solumedrol, O2, Nebs, etc; readmitted 01/2016 w/ COPD exac- similar Rx but sent to NH for rehab at disch...    Cardiac issues> ?nonobstructive CAD, cor pulmonale w/ mild pulmHTN & 2DEcho 09/2015 showing norm LVF w/ EF=55-60%, norm wall motion, mild MR, mild RA dil, PAsys est 36mm1m.episodes PAF 10/18 & converted  spont- disch from hosp on Eliquis, Metoprolol, Cardizem & f/u w/ [facility]-DrRandolph; then he developed ventric bigeminy=> w/u in progress...    Medical issues> PCP= Dr. Lauretta Grill Koirala ([facility])>  HBP (on CardizemCD240), HL (on Cres10), hx thyroid nodule, GERD & prob LPR (GI= DrSchooler, on Prilosec40), Constipation, BPH (on Proscar5 & Flomax0.4) w/ hx urinary retention, Anemia w/ iron dific(PCP eval in progress) & insomnia (on Restoril30)... NOTE:  He's been anemic and Fe has been low- pt tells me that his PCP DrKoirala has done post-hosp blood work & is taking care of his anemia work-up! EXAM shows Afeb, VSS, O2sat=95% on 2L/min pulse dose;  Heent- neg, mallampati1;  Chest- decr BS at bases, can't augment BS voluntarily, w/o w/r/r;  Heart- RR w/o m/r/g;  Abd- soft, neg;  Ext- neg w/o c/c/e;  Neuro- intact...  EKG 02/02/18>  PVCs and ventric bigeminy => refer to cards for further eval & rx...   CXR 01/17/18>   Norm heart size, hyperinflation/ bullous emphysema (esp RUL), no acute abn...  LABS 01/2018 in Hosp>  Chems- ok x HCO3=20, BS=178, Cr=0.95, LFTs ok;  CBC- Hg 11.2=>9.0, WBC=10.2;   IMP/PLAN>>  Andrew [name]'s pulse was recorded at 34 on arrival but this was because of his bigeminy- he is asymptomatic (doesn't note skips, irreg, fluttering, dizzy, etc); on Eliquis, Metoprolol, Cardizem and we will refer back to [facility] for further eval & Rx... REC to continue O2, Pred 17m/d + DUONEB, ADVAIR, SPIRIVA, Mucinex, fluids... We plan recheck in 168mo ~  March 02, 2018:  66m28moV & when last seen 66mo52mo Andrew [name] was in bigeminy yet asymptomatic & we referred him to [facility]- he saw HMeng,PA (for [employer] 02/10/18> known hx HBP, PAF, HL, mild OSA on CPAP & borderline PulmHTN (he has been on Revatio for yrs & refuses to wean this med further); he had PAF 10/18 when hosp for pneumonia, disch on Eliquis, Metoprolol, Cardizem; currently in NSR w/ PVCs and bigeminy- they incr his Cardizem to 180mg54m plan Holter monitor w/ f/u by DrRandolph...     Pulm status is stable today> on O2 at 1L/min rest & 2L/min w/ exercise; on Pred 10mg/93mdvair250Bid & Spiriva daily; he has NEB w/ Duoneb to use Tid; on CPAP7 Qhs & doing satis he says...    He has numerous medical issues and his PCP is DrKoirala- concern for his persistent normochromic anemia & pt says there has been no additional evaluation done; we discussed the need for f/u CBC, Fe, B12, SPE/IEP & he requested for us to [place] these needed tests today... EXAM shows Afeb, VSS, O2sat=95% on 2L/min pulse dose;  Heent- neg, mallampati1;  Chest- decr BS at bases, can't augment BS voluntarily, w/o w/r/r;  Heart- RR w/o m/r/g;  Abd- soft, neg;  Ext- neg w/o c/c/e;  Neuro- intact...  LABS 03/02/18>  Chems- wnl w/ BS=103, Cr=0.92, LFTs wnl;  CBC- anemic w/ Hg=10.6, mcv=88;  Fe=27 (6%sat), Ferritin=8.6;  B12=638, Folate>24, SPE/IEP- neg (no monoclonal prot);  Stool is POS for hidden  blood... IMP/PLAN>>  Andrew [name]'s eval confirms the Dx of iron defic anemia and he has heme pos stools=> we will start Rx w/ FeSO4 325mg +766mC500 daily & refer to his GI- DrSchooler for further eval... Hx PAF, +PVCs being evaluated by [facility]- [place] on Eliquis, Metoprolol, Cardizem..  ~  Apr 14, 2018:  6wk ROV & last visit Andrew [name] had ventric bigeminy & was seen by Cards w/ Cardizem incr to 180mg/d;3m was  anemic w/ Hg=10.6 w/ Fe=27 (6%sat) and Ferritin=8.6; stools were pos for hidden blood & we started FeSO4 + VitC and referred him to GI- DrSchooler;  We do not have notes from PCP- DrKoirala or from GI- DrSchooler but pt tells me that he is sched for colonoscopy soon... Pt states that his breathing is about the same- min cough (he feels related to reflux), no sput, no hemoptysis, chr stable DOE but says it takes longer to recover after activity, no CP, no f/c/s, etc...  We reviewed the following available interval Epic notes>      He saw [facility]- DrRandolph on 03/23/18>  Hx HBP, PAF, OSA on CPAP now, hx pulmHTN; on Eliquis, Metoprolol, CardizemCD, Revatio;  SOB/DOE was stable, no edema, no dizziness or syncope; they decreased his Metoprolol to 12.67m Bid...     He had a formal PT eval 04/06/18>  Notes reviewed from [facility]/ neurorehab, goals outlined, he is hoping to get back into full pulm rehab eventually... We reviewed the following medical problems during today's office visit>    Severe COPD- GOLD Stage3, bullous emphysema, s/p resection of RUL bleb in 2007> on Pred 134md, Advair250Bid & Spiriva daily; he has NEB w/ Duoneb to use Tid; Spirometry 09/2015 w/ FEV1=1.12 (37%); he finished his PulmRehab 07/2016 & then [facility] & hasn't bounced back as hoped, referred [facility] PT [facility]    Chronic hypoxemic respiratory failure on O2 at 2L/min via POC> ambulatory O2sats dropped on RA & he uses O2 at 1L/min  Qhs and 2L/min w/ activ...    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     Hx cor pulmonale/ pulmonary hypertension on Revatio since 2007 w/o additional med trials or dose adjustments> we do not have any of the objective data from his prev physician teams; 2DEcho here 09/2015 showed PAsys=36 and we tried to wean his Revatio but he refused due to "other reasons", finally compromised on Revatio20Bid...    Hx CAP- Hosp 2/17 & 10/18 w/ RLL opac (nos) & responded to broad spectrum Ab coverage + Solumedrol, O2, Nebs, etc; readmitted 01/2016 w/ COPD exac- similar Rx but sent to NH for rehab at disch...    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    Medical issues> PCP= Dr. DibLauretta Grillirala ([facility])>  HBP (on CardizemCD240), HL (on Cres10), hx thyroid nodule, GERD & prob LPR (GI= DrSchooler, on Prilosec40), Constipation, BPH (on Proscar5 & Flomax0.4) w/ hx urinary retention, Anemia w/ iron defic and heme pos stools (referred to GI- DrSchooler) & insomnia (on Restoril30)... EXAM shows Afeb, VSS, O2sat=97% on 4L/min pulse dose;  Heent- neg, mallampati1;  Chest- decr BS at bases, can't augment BS voluntarily, w/o w/r/r;  Heart- RR w/o m/r/g;  Abd- soft, neg;  Ext- neg w/o c/c/e;  Neuro-  intact...  CPAP Download 4/23 - 04/08/18 showed excellent compliance 30/30 days, ~7hrs per night, CPAP7 w/ AHI=0.7, min leak & doing satis...  IMP/PLAN>>  Andrew [name] is just starting PT/ outpt rehab exercises & hoping to improve suffic to qualify for regular Pulm Rehab again;  He is regular w/ his NEBS Tid, Advair250Bid & Spiriva daily, Pred 56m/d and Mucinex 6027mid;  Requests refill Pred10 & Tessalon- ok;  Continue CPAP Qhs as he is doing- download looks good...  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 After the procedures he developed cough, discolored sput, fever, and incr SOB; he [facility] ER [facility] ex[facility], unable to produce sput for cult, BC neg x2, covered w/ Levaqun & Solumedrol=> weaned & improved toward baseline; f/u CXR w/ COPD/E and NAD;  Labs showed WBC=15K, Hg=10.4, Chems- all wnl;  Disch on same med regimen w/ instructions to f/u w/ PCP-DrKoirala & w/ Pulmonary... We reviewed the following medical problems during today's office visit>    Severe COPD- GOLD Stage3, bullous emphysema, s/p resection of RUL bleb in 2007> on Pred 10mg68mAdvair250Bid & Spiriva daily; he has NEB w/ Duoneb to use Tid; Spirometry 09/2015 w/ FEV1=1.12 (37%); he finished his PulmRehab 07/2016 & then exercised regularly at the gym => [facility] w/ COPD exac & hasn't bounced back as hoped, referred for PT in advance of trying to get back in [facility]  Chronic hypoxemic respiratory failure on O2 at 2L/min via POC> ambulatory O2sats dropped on RA & he uses O2 at 1L/min Qhs and 2L/min w/ activ...    OSA> now on CPAP7 + 1L/min O2 bleed-in> Hx  suspected hypercarbic resp failure in past inferred from Pt report of BiPAP ([facility]) since 2007, never prev on CPAP & prev never had sleep study; we never received records from former physician teams or any of his old records; ABGs in Epic all without CO2 retention=> he has agreed to re-assessment 2018=> SEE ABOVE (mild OSA & O2 desat); now on CPAP7 plus O2 bleed-in & downloads show good compliance & efficacy...     Hx cor pulmonale/ pulmonary hypertension on Revatio since 2007 w/o additional med trials or dose adjustments> we do not have any of the objective data from his prev physician teams; 2DEcho here 09/2015 showed PAsys=36 and we tried to wean his Revatio but he refused due to "other reasons", finally compromised on Revatio20Bid...    Hx CAP- Hosp 2/17, 10/18, & 6/19 w/ RLL opac (nos) & responded to broad spectrum Ab coverage + Solumedrol, O2, Nebs, etc; he's had several COPD exac- similar Rx but sent to NH for rehab at disch...    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    Medical issues> PCP= Dr. Dibas Lauretta Grillla (  [facility])>  HBP (on CardizemCD240), HL (on Cres10), hx thyroid nodule, GERD & prob LPR (GI= DrSchooler, on Prilosec40), Constipation, BPH (on Proscar5 & Flomax0.4) w/ hx urinary retention, Anemia w/ iron defic and heme pos stools (referred to GI- DrSchooler) & insomnia (on Restoril30)... EXAM shows Afeb, VSS, O2sat=99% on 2L/min pulse dose;  Heent- neg, mallampati1;  Chest- decr BS at bases, can't augment BS voluntarily, w/o w/r/r;  Heart- RR w/o m/r/g;  Abd- soft, neg;  Ext- neg w/o c/c/e;  Neuro- intact...  CXR 05/06/18>  Norm heart size, advanced COPD/ emphysema w/ apical bullae &  incr markings bilat, old sutures on right, NAD...  LABS 05/06/18>  Chems- ok w/ K=4.1, BS=105, Cr=0.95, LFTs wnl;  CBC w/ Hg=12.0, mcv=89, WBC=14K;  Fe=100 (28%sat), Ferritin=44... IMP/PLAN>>  Andrew [name] is improved from his recent COPD exac & hosp- keep Pred at 90m/d, plus DUONEB Tid, Advair250Bid & Spiriva once daily; he will continue other meds the same & restart [facility]   ~  June 25, 2018:  254moOV & Andrew [name] indicates that he is improved, has been participating in phys [facility] and he has contacted [facility] restart[facility] program- sched to ret 07/06/18 to discuss re-entry into PulmRehab... He still notes SOB/ DOE more than 24m54moo & hoping that all the therapy will help...    Severe COPD- GOLD Stage3, bullous emphysema, s/p resection of RUL bleb in 2007> on Pred 84m59m Advair250Bid & Spiriva daily; he has NEB w/ Duoneb to use Tid; Spirometry 09/2015 w/ FEV1=1.12 (37%); he finished his PulmRehab 07/2016 & then exercised regularly at the gym => [facility] ex[facility] & hasn't bounced back as hoped, [facility] to [facility]   Chronic hypoxemic respiratory failure on O2 at 2L/min via POC> ambulatory O2sats dropped on RA & he uses O2 at 1L/min Qhs and 2L/min w/ activ...    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     Hx cor pulmonale/ pulmonary hypertension on Revatio since 2007 w/o additional med trials or dose adjustments> we do not have any of the objective data from  his prev physician teams; 2DEcho here 09/2015 showed PAsys=36 and we tried to wean his Revatio but he refused due to "other reasons", finally compromised on Revatio20Bid...    Hx CAP- Hosp 2/17, 10/18, & 6/19 w/ RLL opac (nos) & responded to broad spectrum Ab coverage + Solumedrol, O2, Nebs, etc; he's had several COPD exac- similar Rx but sent to NH for rehab at disch...    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 EXAM shows Afeb, VSS, O2sat=99% on 2L/min pulse dose;  Heent- neg, mallampati1;  Chest- decr BS at bases,  can't augment BS voluntarily, w/o w/r/r;  Heart- RR w/o m/r/g;  Abd- soft, neg;  Ext- neg w/o c/c/e;  Neuro- intact... IMP/PLAN>>  Andrew [name] has stabilized on his current med regimen + rehab efforts;  He is hoping to start back into pulm rehab very soon;  We wrote for a ZPak to keep on hand & he will call prn any problems;  I woud like to recheck pt at least once more before my retirement in 10/2018...    ~  September 28, 2018:  83moROV & pulmonary follow up visit>  Andrew [name] reports an upper resp infection ~3wks ago w/ cough, mod amt dark sput, but denies f/c/s=> took ZPak & improved; then last week felt weak/ tired/ incr SOB=> now c/o min cough, small amt clear sput, no hemoptysis, no f/c/s, and denies CP/ palpit/ edema... his regular med regimen includes:  CPAP Qhs,  O2 at 2-3L/min continuously,  PRED 167md,  NEBS w/ Duoneb Tid,  Advair250Bid,  Spiriva once daily,  Mucinex600 1-2Bid, ProairHFA prn; he remains in [employer] [facility]... we discussed checking f/u CXR ?& blood work... We reviewed the following available interval Epic notes>      He saw [facility]- DrRandolph on 07/27/18>  HxHBP,  PAF, HL, OSA on CPAP, pulmHTN- on Eliquis5Bid, CardizemCD180; holding NSR & he remains asymptomatic, he remains on Revatio20Bid & does not want to change that med... We reviewed the following medical problems during today's office visit>      Severe COPD- GOLD Stage3, bullous emphysema, s/p resection of RUL bleb in 2007> on Pred 1069m, Advair250Bid & Spiriva daily; he has NEB w/ Duoneb to use Tid; Spirometry 09/2015 w/ FEV1=1.12 (37%); he finished his PulmRehab 07/2016 & then exercised regularly at the gym [facility] & hasn't bounced back as hoped, [facility] to [facility]    Chronic hypoxemic respiratory failure on O2 at 2L/min via POC> ambulatory O2sats dropped on RA & he uses O2 at 1L/min Qhs and 2L/min w/ activ...    OSA> now on CPAP7 + 1L/min O2 bleed-in> Hx suspected hypercarbic resp failure in past inferred from Pt report of BiPAP ([facility]) since 2007, never prev on CPAP & prev never had sleep study; we never received records from former physician teams or any of his old records; ABGs in Epic all without CO2 retention=> he agreed to re-assessment 2018=> SEE ABOVE (mild OSA & O2 desat); now on CPAP7 plus O2 bleed-in & downloads show good compliance & efficacy...     Hx cor pulmonale/ pulmonary hypertension on Revatio since 2007 w/o additional med trials or dose adjustments> we do not have any of the objective data from his prev physician teams; 2DEcho here 09/2015 showed PAsys=36 and we tried to wean his Revatio but he refused due to "other reasons", finally compromised on Revatio20Bid & he wants to continue this med...    Hx CAP- Hosp 2/17, 10/18, & 6/19 w/ RLL opac (nos) & responded to broad spectrum Ab coverage + Solumedrol, O2, Nebs, etc; he's had several COPD exac- similar Rx but sent to NH for rehab at disch...    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    Medical issues> PCP= Dr. Lauretta Grill Koirala ([facility])>  HBP (on CardizemCD240), HL (on Cres10), hx thyroid nodule, GERD & prob LPR (GI= DrSchooler, on Prilosec40), Constipation, BPH (on Proscar5 & Flomax0.4) w/ hx urinary retention, Anemia w/ iron defic and heme pos stools (referred to GI- DrSchooler) & insomnia (on Restoril30)... EXAM shows Afeb, VSS, O2sat=99% on 2L/min pulse dose;  Heent- neg, mallampati1;  Chest- decr BS at bases, can't augment BS voluntarily, w/o w/r/r;  Heart- RR w/o m/r/g;  Abd- soft, neg;  Ext- neg w/o c/c/e;  Neuro- intact...  CXR 09/28/18 (independently reviewed by me in the PACS system) shows norm heart size, severe bullous emphysema, no signs of focal airsp dis, edema, nodules, etc...  LABS 09/28/18>  Chems- ok w/ K=4.5, BS=122, Cr=1.09, LFTs wnl;  CBC- ok w/ Hg=15.6, WBC-9.2;  TSH=0.77;  Sed=12;  BNP=23;  Fe=134 (37%sat), Ferritin=35 IMP/PLAN>>  Andrew [name] appears to be back to baseline & eval today w/o acute abnormalities-  CXR w/ severe COPD/ nad,  LABS all ok;  We reviewed his medication regimen as above- continue regular meds, and his pulm rehab exercise program;  He feels better having Q57moROV rechecks and we will arrnge for f/u w/ DrIcard in Jan2020...    Past Medical History:  Diagnosis Date  . [place] 2018 w/ mild OSA & nocturnal desat => placed on new CPAP apparatus w/ O2 bleed-in...    . BPH (benign prostatic hyperplasia)   . Cancer ([place]    skin  . COPD ([place] obstructive [place] disease) ([place]   . Dysphagia   . Emphysema lung ([place]   . GERD (gastroesophageal reflux disease)    " Silent reflux"  . Hearing loss    right ear  . HLD  (hyperlipidemia)   . Hypertension   . Oxygen dependent    2-3 liters  . Oxygen dependent   . Pneumonia   Medical Hx>  HBP, ?nonobstructive CAD, HL, thyroid nodule, GERD, constipation, BPH, insomnia... Meds include>  CardizemCD240, Crestor10, Nexium20, Miralax, Proscar10, Restoril30...   Past Surgical History:  Procedure Laterality Date  . CARDIAC CATHETERIZATION    . CATARACT EXTRACTION W/ INTRAOCULAR LENS  IMPLANT, BILATERAL    . COLONOSCOPY WITH PROPOFOL N/A 04/20/2018   Procedure: COLONOSCOPY WITH PROPOFOL;  Surgeon: SWilford Corner MD;  [place]  Service: Endoscopy;  Laterality: N/A;  . ESOPHAGOGASTRODUODENOSCOPY (EGD) WITH PROPOFOL N/A 08/23/2016   Procedure: ESOPHAGOGASTRODUODENOSCOPY (EGD) WITH PROPOFOL;  Surgeon: VWilford Corner MD;  [facility];  Service: Endoscopy;  Laterality: N/A;  . ESOPHAGOGASTRODUODENOSCOPY (EGD) WITH PROPOFOL N/A 04/20/2018   Procedure: ESOPHAGOGASTRODUODENOSCOPY (EGD) WITH PROPOFOL;  Surgeon: SWilford Corner MD;  [place]  Service: Endoscopy;  Laterality: N/A;  . HOT HEMOSTASIS N/A 04/20/2018   Procedure: HOT HEMOSTASIS (ARGON PLASMA COAGULATION/BICAP);  Surgeon: SWilford Corner MD;  [place]  Service: Endoscopy;  Laterality: N/A;  . LUNG SURGERY    . POLYPECTOMY  04/20/2018   Procedure: POLYPECTOMY;  Surgeon: SWilford Corner MD;  [facility];  Service: Endoscopy;;  . TONSILLECTOMY    [place]   Outpatient Encounter Medications as of 09/28/2018  Medication Sig  . ADVAIR DISKUS 250-50 MCG/DOSE AEPB Inhale 1 puff into the lungs 2 (two) times daily.  .Marland Kitchenalbuterol (PROVENTIL HFA;VENTOLIN HFA) 108 (90 Base) MCG/ACT inhaler Inhale 2 puffs into the lungs every 6 (six) hours as needed for  wheezing or shortness of breath.  Marland Kitchen apixaban (ELIQUIS) 5 MG TABS tablet Take 1 tablet (5 mg total) by mouth 2 (two) times daily.  Marland Kitchen diltiazem (CARDIZEM  CD) 180 MG 24 hr capsule Take 1 capsule (180 mg total) by mouth daily.  . finasteride (PROSCAR) 5 MG tablet Take 5 mg by mouth at bedtime.   . fluticasone (FLONASE) 50 MCG/ACT nasal spray Place 1 spray into both nostrils at bedtime.   Marland Kitchen guaiFENesin (MUCINEX) 600 MG 12 hr tablet Take 1 tablet (600 mg total) by mouth 2 (two) times daily.  . hydrocortisone cream 1 % Apply 1 application topically daily as needed (Eczema on face and head).  Marland Kitchen ipratropium-albuterol (DUONEB) 0.5-2.5 (3) MG/3ML SOLN Take 3 mLs by nebulization 3 (three) times daily.  Marland Kitchen ipratropium-albuterol (DUONEB) 0.5-2.5 (3) MG/3ML SOLN INHALE 1 VIAL VIA NEBULIZER EVERY three times a day  . liver oil-zinc oxide (DESITIN) 40 % ointment Apply 1 application topically daily.  . Multiple Vitamins-Minerals (MULTIVITAMIN WITH MINERALS) tablet Take 1 tablet by mouth daily.  Marland Kitchen omeprazole (PRILOSEC) 40 MG capsule Take 40 mg by mouth 2 (two) times daily.   . OXYGEN Inhale 2-3 L into the lungs See admin instructions. 2L daytime, 3L at night   . polyethylene glycol (MIRALAX / GLYCOLAX) packet Take 17 g by mouth daily. (Patient taking differently: Take 17 g by mouth at bedtime. Mix in 8 oz liquid and drink)  . predniSONE (DELTASONE) 10 MG tablet Take 1 tablet (10 mg total) by mouth daily with breakfast.  . rosuvastatin (CRESTOR) 10 MG tablet Take 10 mg by mouth daily.  . sildenafil (REVATIO) 20 MG tablet Take 1 tablet (20 mg total) by mouth 2 (two) times daily.  Marland Kitchen SPIRIVA HANDIHALER 18 MCG inhalation capsule INHALE THE CONTENTS OF 1 CAPSULE EVERY DAY  . tamsulosin (FLOMAX) 0.4 MG CAPS capsule Take 1 capsule (0.4 mg total) by mouth daily after breakfast.  . temazepam (RESTORIL) 30 MG capsule Take 1 capsule (30 mg total) by mouth at bedtime.  . vitamin C (ASCORBIC ACID) 500 MG tablet Take 500 mg by mouth 2 (two) times daily.   . [DISCONTINUED] predniSONE (DELTASONE) 10 MG tablet Take 4 tabs daily for 3 days, then 3 tabs daily for 3 days, then 2 tabs  daily for 3 days, then continue 1 tab daily maintenance as you were doing before. (Patient taking differently: Take 10 mg by mouth daily with breakfast. )  . [DISCONTINUED] predniSONE (DELTASONE) 10 MG tablet Take 1 tablet (10 mg total) by mouth daily with breakfast.   No facility-administered encounter medications on file as of 09/28/2018.     No Known Allergies   Immunization History  Administered Date(s) Administered  . Influenza, High Dose Seasonal PF 07/22/2016, 08/18/2017, 08/17/2018  . Influenza-Unspecified 09/19/2015  . PPD Test 02/15/2016  . Pneumococcal Conjugate-13 09/19/2015  . Pneumococcal Polysaccharide-23 12/20/2016    Current Medications, Allergies, Past Medical History, Past Surgical History, Family History, and Social History were reviewed in [employer] record.   Review of Systems             All symptoms NEG except where BOLDED >>  Constitutional:  F/C/S, fatigue, anorexia, unexpected weight change. HEENT:  HA, visual changes, hearing loss, earache, nasal symptoms, sore throat, mouth sores, hoarseness. Resp:  cough, sputum, hemoptysis; SOB, tightness, wheezing. Cardio:  CP, palpit, DOE, orthopnea, edema. GI:  N/V/D/C, blood in stool; reflux, abd pain, distention, gas. GU:  dysuria, freq, urgency, hematuria, flank pain,  voiding difficulty. MS:  joint pain, swelling, tenderness, decr ROM; neck pain, back pain, etc. Neuro:  HA, tremors, seizures, dizziness, syncope, weakness, numbness, gait abn. Skin:  suspicious lesions or skin rash. Heme:  adenopathy, bruising, bleeding. Psyche:  confusion, agitation, sleep disturbance, hallucinations, anxiety, depression suicidal.   Objective:   Physical Exam       Vital Signs:  Reviewed...   General:  WD, WN, 77 y/o WM in NAD; alert & oriented; pleasant & cooperative... HEENT:  [place]/AT; Conjunctiva- pink, Sclera- nonicteric, EOM-wnl, PERRLA, EACs-clear, TMs-wnl; NOSE-clear; THROAT-clear & wnl.   Neck:  Supple w/ fair ROM; no JVD; normal carotid impulses w/o bruits; no thyromegaly +small nodule palpated; no lymphadenopathy.  Chest:  Overinflated, resonant percussion note, decr BS bilat & can't augment BS voluntarily, no w/r/r heard... Heart:  Regular Rhythm; norm S1 & S2 without murmurs, rubs, or gallops detected. Abdomen:  Soft & nontender- no guarding or rebound; normal bowel sounds; no organomegaly or masses palpated. Ext:  decr ROM; without deformities or arthritic changes; no varicose veins, +venous insuffic, no edema;  Pulses intact w/o bruits. Neuro:  No focal neuro deficits; sensory testing normal; gait normal & balance OK. Derm:  No lesions noted; no rash etc. Lymph:  No cervical, supraclavicular, axillary, or inguinal adenopathy palpated.   Assessment:      IMP >>     Severe COPD- GOLD Stage3, bullous emphysema, s/p resection of RUL bleb in 2007> on Advair250Bid & Spiriva daily; he has NEB w/ Duoneb for prn use; Spirometry 09/2015 w/ FEV1=1.12 (37%); he has completed pulm rehab & now exercises at the gym 5d/wk;  He's had mult COPD exac => treat w/ Levaquin, Pred taper, continue Advair/ spiriva/ NEBS/ O2...    Chronic hypoxemic respiratory failure on O2 at 2L/min via POC> ambulatory O2sats 09/2015 dropped to 89% on 2L/min after 2 Laps; he dropped to 85% after 1Lap on 3L/min POC 11/2016...    Hx prob hypercarbic resp failure inferred from Pt report of BiPAP ([facility]) since 2007, never been on CPAP, never had sleep study, unknown ABGs or pCO2 data> we never received data from his prev physicians.    Hx cor pulmonale/ pulmonary hypertension on Revatio20Tid since 2007 w/o additional med trials or dose adjustments> we do not have any of the objective data from his prev physician teams; 2DEcho here 09/2015 showed PAsys=36 and we tried to wean his Revatio but he refused due to "other reasons", finally compromised on Revatio20Bid...    Hx CAP- Hosp 12/2015 w/ RLL opac (nos) & responded to  broad spectrum Ab coverage + Sulomedrol, O2, Nebs, etc; readmitted 01/2016 w/ COPD exac- similar Rx but sent to NH for rehab at disch...    Hx of being on Home BiPAP since 2007 w/ eval & set up in [place]> we have been unable to obtain any old records regarding his initial studies and BiPAP set up...    Cardiac issues>  ?nonobstructive CAD, cor pulmonale w/ mild pulmHTN & 2DEcho 09/2015 showing norm LVF w/ EF=55-60%, norm wall motion, mild MR, mild RA dil, PAsys est 32mHg...    Medical issues include:  HBP (on CardizemCD240), HL (on Cres10), hx thyroid nodule, GERD (on Nexium40), constipation, BPH (on Proscar5 & Flomax0.4) w/ hx urinary retention, insomnia (on Restoril30)...       NOTE>  He is Iron deficient & need further eval...  PLAN >>  10/02/15>   MJaymisonhas a distinctly patchy history to go along w/ his severe airflow obstruction & bullous  emphysema;  We really need his old objective data from 2007 when he was started on O2 & BiPAP after his RUL bleb reduction surg;  He will try to get names and numbers for [place]- in the meanwhile we will contact his last physician in [facility] for their more recent data as we establish out data base here in [place];  He is very concerned that he wants [place] to continue his current regimen which has served him well over the last 52yr;  Continue Advair250Bid, Spiriva daily, Revatio20tid, O2 at 2L/min, and the BiPAP nightly as currently set...  11/01/15>  See prob list above- he is stable on this regimen but does not want to wean the Revatio further for "other" reasons; OK to continue current med regimen- advised regular exercise vs pulm rehab program; given ZPak for prn use over the winter & he knows to call for any resp issues, incr dyspnea, etc... He remains on BiPAP but we do not have any data- no records received from prev pulm physicians, no notes from [place] no download data from his machine- we will again try to make contact w/ his DME company... 03/18/16>    MWinfreyhas had a protracted resp eL[place]2 this spring- triggered by prob RLL pneumonia (NOS) superimposed on his severe COPD/bullous emphysema;  Rec to change the [place] continue treatments Tid; continue other meds regularly as outlined;  [facility];  We will plan ROV in 335moooner if needed. 06/20/16>   Andrew [name] is stable on his baseline regimen + pulm rehab, etc; rec to continue current meds and exercise; he needs the 2017 Flu vaccine when avail & will call prn any breathing problems; we plan ROV in 54m15mo  11/25/16>   There is no such thing as a mild exac for MrBowman- even the mildest of URIs can cause a severe COPD exac- we discussed Rx w/ Levaquin500 x7d, Pred20m84md taper (see AVS), + Diflucan100 per his request; hopefully he will respond but he has little in the way of reversible factors given his severe emphysema; [facility] worsen despite therapy; as noted he is Fe defic & needs further eval per his PCP & GI- copies sent. => subseq Hosp for 4d w/ IV Solumedrol, NEBs/ O2/ etc... 05/01/17>   Andrew [name] has agreed to proceed w/ home sleep test to r/o OSA and we will further assess w/ ABG on RA to check for CO2 retention and any indication for BiPAP (doubt he will qualify at this time);  He will continue w/ his O2, Advair250, Spiriva, Duoneb vs ProventilHFA which he uses prn;  He does not appear to need the Revatio either but declines to discontinue this medication for other reasons; 07/31/17>   As explained above we will order an in-lab CPAP/BiPAP titration study, and proceed from there. 08/2017>   He was [facility] w/ RLL pneumonia (nos) and ac on chr hypoxemic resp failure; disch on NEBS, Advair, Spiriva, Pred + Eliquis, Cardizem, Metoprolol for his PAF episode...  09/22/17>   We decided to continue his low dose Pred rx w/ 5mg 62m;  Continue other meds as prev including NEBs w/ Duoneb Tid followed by  Advair250Bid & Spiriva once daily, Mucinex 1-2Bid, Tessalon tid as needed... We plan rov recheck in 6 wks. 11/04/17>   We discussed slowly weaning the Pred down to 5mg Q97m&  continue this til ROV in 868mo  We will watch BP/ pulse state w/ an eye towards weaning the BBlocker if able;  His appetite is improved, wt up 7#, and back in the gym daily! 12/16/17>   We decided to treat his COPD exac w/ DOXY 1074mBid, and an incr Pred 68m68mabs to 2tabs Qam for 1wk, then taper to 2-1 Qod, then 2-0 Qod til return;  Asked to continue NEBS Tid followed by Advair-Bid & Spiriva-Qd, + Mucinex600Bid, etc. 02/02/18>   Andrew [name]'s pulse was recorded at 34 on arrival but this was because of his bigeminy- he is asymptomatic (doesn't note skips, irreg, fluttering, dizzy, etc); on Eliquis, Metoprolol, Cardizem and we will refer back to CARDS for further eval & Rx... REC to continue O2, Pred 72m43m+ DUONEB, ADVAIR, SPIRIVA, Mucinex, fluids... We plan recheck in 44mo 8mo/19>   Andrew [name]'s eval confirms the Dx of iron defic anemia and he has heme pos stools=> we will start Rx w/ FeSO4 3268mg 66mtC500 daily & refer to his GI- DrSchooler for further eval... Hx PAF, +PVCs being evaluated by CARDS- DrRaIvar Draperemains on Eliquis, Metoprolol, Cardizem 04/14/18>   Andrew [name] is just starting PT/ outpt rehab exercises & hoping to improve suffic to qualify for regular Pulm Rehab again;  He is regular w/ his NEBS Tid, Advair250Bid & Spiriva daily, Pred 72mg/d24m Mucinex 600mgBid63mequests refill Pred10 & Tessalon- ok;  Continue CPAP Qhs as he is doing- download looks good...  We plan ROV recheck in 42mo. 6/116mo>   Andrew [name] is improved from his recent COPD exac & hosp- keep Pred at 72mg/d, p42mDUONEB Tid, Advair250Bid & Spiriva once daily; he will continue other meds the same & restart his PT w/ the [facility] 06[facility]>   Andrew [name] has stabilized on his current med regimen + rehab efforts;  He is hoping to start back into pulm rehab very  soon;  We wrote for a ZPak to keep on hand & he will call prn any problems;  I woud like to recheck pt at least once more before my retirement in 10/2018... 09/28/18>   Andrew [name] appears to be back to baseline & eval today w/o acute abnormalities-  CXR w/ severe COPD/ nad,  LABS all ok;  We reviewed his medication regimen as above- continue regular meds, and his pulm rehab exercise program;  He feels better having Q42mo ROV re842moks and we will arrnge for f/u w/ DrIcard in Jan2020   Plan:     Patient's Medications  New Prescriptions   No medications on file  Previous Medications   ADVAIR DISKUS 250-50 MCG/DOSE AEPB    Inhale 1 puff into the lungs 2 (two) times daily.   ALBUTEROL (PROVENTIL HFA;VENTOLIN HFA) 108 (90 BASE) MCG/ACT INHALER    Inhale 2 puffs into the lungs every 6 (six) hours as needed for wheezing or shortness of breath.   APIXABAN (ELIQUIS) 5 MG TABS TABLET    Take 1 tablet (5 mg total) by mouth 2 (two) times daily.   DILTIAZEM (CARDIZEM CD) 180 MG 24 HR CAPSULE    Take 1 capsule (180 mg total) by mouth daily.   FINASTERIDE (PROSCAR) 5 MG TABLET    Take 5 mg by mouth at bedtime.    FLUTICASONE (FLONASE) 50 MCG/ACT NASAL SPRAY    Place 1 spray into both nostrils at bedtime.    GUAIFENESIN (MUCINEX) 600 MG 12 HR TABLET    Take 1 tablet (  600 mg total) by mouth 2 (two) times daily.   HYDROCORTISONE CREAM 1 %    Apply 1 application topically daily as needed (Eczema on face and head).   IPRATROPIUM-ALBUTEROL (DUONEB) 0.5-2.5 (3) MG/3ML SOLN    Take 3 mLs by nebulization 3 (three) times daily.   IPRATROPIUM-ALBUTEROL (DUONEB) 0.5-2.5 (3) MG/3ML SOLN    INHALE 1 VIAL VIA NEBULIZER EVERY three times a day   LIVER OIL-ZINC OXIDE (DESITIN) 40 % OINTMENT    Apply 1 application topically daily.   MULTIPLE VITAMINS-MINERALS (MULTIVITAMIN WITH MINERALS) TABLET    Take 1 tablet by mouth daily.   OMEPRAZOLE (PRILOSEC) 40 MG CAPSULE    Take 40 mg by mouth 2 (two) times daily.    OXYGEN    Inhale 2-3 L  into the lungs See admin instructions. 2L daytime, 3L at night    POLYETHYLENE GLYCOL (MIRALAX / GLYCOLAX) PACKET    Take 17 g by mouth daily.   ROSUVASTATIN (CRESTOR) 10 MG TABLET    Take 10 mg by mouth daily.   SILDENAFIL (REVATIO) 20 MG TABLET    Take 1 tablet (20 mg total) by mouth 2 (two) times daily.   SPIRIVA HANDIHALER 18 MCG INHALATION CAPSULE    INHALE THE CONTENTS OF 1 CAPSULE EVERY DAY   TAMSULOSIN (FLOMAX) 0.4 MG CAPS CAPSULE    Take 1 capsule (0.4 mg total) by mouth daily after breakfast.   TEMAZEPAM (RESTORIL) 30 MG CAPSULE    Take 1 capsule (30 mg total) by mouth at bedtime.   VITAMIN C (ASCORBIC ACID) 500 MG TABLET    Take 500 mg by mouth 2 (two) times daily.   Modified Medications   Modified Medication Previous Medication   PREDNISONE (DELTASONE) 10 MG TABLET predniSONE (DELTASONE) 10 MG tablet      Take 1 tablet (10 mg total) by mouth daily with breakfast.    Take 4 tabs daily for 3 days, then 3 tabs daily for 3 days, then 2 tabs daily for 3 days, then continue 1 tab daily maintenance as you were doing before.  Discontinued Medications   No medications on file

## 2018-09-29 ENCOUNTER — Encounter (HOSPITAL_COMMUNITY)
Admission: RE | Admit: 2018-09-29 | Discharge: 2018-09-29 | Disposition: A | Discharge status: Home / Self Care | Payer: Medicare Other | Source: Ambulatory Visit | Attending: Pulmonary Disease | Admitting: Pulmonary Disease

## 2018-09-29 VITALS — Wt 137.8 lb

## 2018-09-29 DIAGNOSIS — J439 Emphysema, unspecified: Secondary | ICD-10-CM

## 2018-09-29 DIAGNOSIS — J9611 Chronic respiratory failure with hypoxia: Secondary | ICD-10-CM | POA: Diagnosis not present

## 2018-09-29 LAB — IRON,TIBC AND FERRITIN PANEL
%SAT: 37 % (ref 20–48)
FERRITIN: 35 ng/mL (ref 24–380)
IRON: 134 ug/dL (ref 50–180)
TIBC: 358 mcg/dL (calc) (ref 250–425)

## 2018-09-29 NOTE — Progress Notes (Signed)
Daily Session Note  Patient Details  Name: Andrew [name] MRN: [id number] Date of Birth: [birth year] Referring Provider:     Pulmonary Rehab Walk Test from 07/09/2018 in [place]  Referring Provider  Dr. Lenna Gilford      Encounter Date: 09/29/2018  Check In: Session Check In - 09/29/18 1030      Check-In   Supervising physician immediately available to respond to emergencies  [facility] immediately available    Physician(s)  Dr. Starla Link    Location  [facility]    Staff Present  Rosebud Poles, RN, BSN;[name], RN, Bjorn Loser, MS, Exercise Physiologist;Lisa Ysidro Evert, Felipe Drone, RN, MHA    Medication changes reported      No    Fall or balance concerns reported     No    Tobacco Cessation  No Change    Warm-up and Cool-down  Performed as group-led instruction    Resistance Training Performed  Yes    VAD Patient?  No    PAD/SET Patient?  No      Pain Assessment   Currently in Pain?  No/denies    Multiple Pain Sites  No       Capillary Blood Glucose: No results found for this or any previous visit (from the past 24 hour(s)).  Exercise Prescription Changes - 09/29/18 1300      Response to Exercise   Blood Pressure (Admit)  110/60    Blood Pressure (Exercise)  110/60    Blood Pressure (Exit)  112/70    Heart Rate (Admit)  94 bpm    Heart Rate (Exercise)  120 bpm    Heart Rate (Exit)  103 bpm    Oxygen Saturation (Admit)  98 %    Oxygen Saturation (Exercise)  96 %    Oxygen Saturation (Exit)  100 %    Rating of Perceived Exertion (Exercise)  15    Perceived Dyspnea (Exercise)  3    Duration  Progress to 45 minutes of aerobic exercise without signs/symptoms of physical distress    Intensity  THRR unchanged      Progression   Progression  Continue to progress workloads to maintain intensity without signs/symptoms of physical distress.      Resistance Training   Training Prescription  Yes    Weight  orange bands    Reps  10-15    Time  10 Minutes      Interval Training   Interval Training  No      Oxygen   Oxygen  Continuous    Liters  2      Treadmill   MPH  1   not feeling well, on z-pak   Grade  0    Minutes  17      NuStep   Level  3    SPM  80    Minutes  17    METs  1.3      Arm Ergometer   Level  1.5    Watts  10    Minutes  17       Social History   Tobacco Use  Smoking Status Former Smoker  . Packs/day: 0.00  . Years: 0.00  . Pack years: 0.00  . Types: Cigarettes  . Last attempt to quit: 10/01/1984  . Years since quitting: 34.0  Smokeless Tobacco Never Used    Goals Met:  Proper associated with RPD/PD & O2 Sat Exercise tolerated well  Goals Unmet:  Not Applicable  Comments: Service time is from 1030 to 1200    Dr. Rush Farmer is Medical Director for Pulmonary Rehab at [facility].

## 2018-09-30 NOTE — Progress Notes (Signed)
Pulmonary Individual Treatment Plan  Patient Details  Name: Andrew [name] MRN: [id number] Date of Birth: Aug 18, 1941 Referring Provider:     Pulmonary Rehab Walk Test from 07/09/2018 in [place]  Referring Provider  Dr. Lenna Gilford      Initial Encounter Date:    Pulmonary Rehab Walk Test from 07/09/2018 in [place]  Date  07/13/18      Visit Diagnosis: Pulmonary emphysema, unspecified emphysema type ([place])  Patient's Home Medications on Admission:   Current Outpatient Medications:  .  ADVAIR DISKUS 250-50 MCG/DOSE AEPB, Inhale 1 puff into the lungs 2 (two) times daily., Disp: 60 each, Rfl: 2 .  albuterol (PROVENTIL HFA;VENTOLIN HFA) 108 (90 Base) MCG/ACT inhaler, Inhale 2 puffs into the lungs every 6 (six) hours as needed for wheezing or shortness of breath., Disp: 1 Inhaler, Rfl: 11 .  apixaban (ELIQUIS) 5 MG TABS tablet, Take 1 tablet (5 mg total) by mouth 2 (two) times daily., Disp: 180 tablet, Rfl: 1 .  diltiazem (CARDIZEM CD) 180 MG 24 hr capsule, Take 1 capsule (180 mg total) by mouth daily., Disp: 90 capsule, Rfl: 2 .  finasteride (PROSCAR) 5 MG tablet, Take 5 mg by mouth at bedtime. , Disp: , Rfl:  .  fluticasone (FLONASE) 50 MCG/ACT nasal spray, Place 1 spray into both nostrils at bedtime. , Disp: , Rfl:  .  guaiFENesin (MUCINEX) 600 MG 12 hr tablet, Take 1 tablet (600 mg total) by mouth 2 (two) times daily., Disp: 30 tablet, Rfl: 0 .  hydrocortisone cream 1 %, Apply 1 application topically daily as needed (Eczema on face and head)., Disp: , Rfl:  .  ipratropium-albuterol (DUONEB) 0.5-2.5 (3) MG/3ML SOLN, Take 3 mLs by nebulization 3 (three) times daily., Disp: 360 mL, Rfl: 2 .  ipratropium-albuterol (DUONEB) 0.5-2.5 (3) MG/3ML SOLN, INHALE 1 VIAL VIA NEBULIZER EVERY three times a day, Disp: 540 mL, Rfl: 2 .  liver oil-zinc oxide (DESITIN) 40 % ointment, Apply 1 application topically daily., Disp: , Rfl:  .  Multiple  Vitamins-Minerals (MULTIVITAMIN WITH MINERALS) tablet, Take 1 tablet by mouth daily., Disp: , Rfl:  .  omeprazole (PRILOSEC) 40 MG capsule, Take 40 mg by mouth 2 (two) times daily. , Disp: , Rfl:  .  OXYGEN, Inhale 2-3 L into the lungs See admin instructions. 2L daytime, 3L at night , Disp: , Rfl:  .  polyethylene glycol (MIRALAX / GLYCOLAX) packet, Take 17 g by mouth daily. (Patient taking differently: Take 17 g by mouth at bedtime. Mix in 8 oz liquid and drink), Disp: 14 each, Rfl: 1 .  predniSONE (DELTASONE) 10 MG tablet, Take 1 tablet (10 mg total) by mouth daily with breakfast., Disp: 30 tablet, Rfl: 6 .  rosuvastatin (CRESTOR) 10 MG tablet, Take 10 mg by mouth daily., Disp: , Rfl:  .  sildenafil (REVATIO) 20 MG tablet, Take 1 tablet (20 mg total) by mouth 2 (two) times daily., Disp: 180 tablet, Rfl: 3 .  SPIRIVA HANDIHALER 18 MCG inhalation capsule, INHALE THE CONTENTS OF 1 CAPSULE EVERY DAY, Disp: 90 capsule, Rfl: 3 .  tamsulosin (FLOMAX) 0.4 MG CAPS capsule, Take 1 capsule (0.4 mg total) by mouth daily after breakfast., Disp: 30 capsule, Rfl: 0 .  temazepam (RESTORIL) 30 MG capsule, Take 1 capsule (30 mg total) by mouth at bedtime., Disp: 30 capsule, Rfl: 5 .  vitamin C (ASCORBIC ACID) 500 MG tablet, Take 500 mg by mouth 2 (two) times daily. , Disp: ,  Rfl:   Past Medical History: Past Medical History:  Diagnosis Date  . Anemia   . BiPAP (biphasic positive airway pressure) dependence    Pt denies history of OSA  . BPH (benign prostatic hyperplasia)   . Cancer ([place])    skin  . COPD (chronic obstructive pulmonary disease) ([place])   . Dysphagia   . Emphysema lung ([place])   . GERD (gastroesophageal reflux disease)    " Silent reflux"  . Hearing loss    right ear  . History of hiatal hernia   . HLD (hyperlipidemia)   . Hypertension   . Oxygen dependent    2-3 liters  . Oxygen dependent   . Pneumonia   . Pulmonary hypertension ([place])   . Sleep apnea    wears cpap    Tobacco  Use: Social History   Tobacco Use  Smoking Status Former Smoker  . Packs/day: 0.00  . Years: 0.00  . Pack years: 0.00  . Types: Cigarettes  . Last attempt to quit: 10/01/1984  . Years since quitting: 34.0  Smokeless Tobacco Never Used    Labs: Recent Chemical engineer    Labs for ITP Cardiac and Pulmonary Rehab Latest Ref Rng & Units 01/02/2016 01/10/2016 06/28/2016 11/25/2016 12/07/2016   Hemoglobin A1c 4.6 - 6.5 % 6.2(H) - - 6.1 -   PHART 7.350 - 7.450 - - 7.444 - 7.442   PCO2ART 32.0 - 48.0 mmHg - - 35.2 - 38.8   HCO3 20.0 - 28.0 mmol/L - - 24.1(H) - 26.1   TCO2 0 - 100 mmol/L - 26 25 - -   O2SAT % - - 94.0 - 98.5      Capillary Blood Glucose: Lab Results  Component Value Date   GLUCAP 115 (H) 01/21/2018   GLUCAP 125 (H) 01/21/2018   GLUCAP 132 (H) 01/20/2018   GLUCAP 173 (H) 01/19/2018     Pulmonary Assessment Scores: Pulmonary Assessment Scores    Row Name 07/13/18 0933         ADL UCSD   ADL Phase  Entry       mMRC Score   mMRC Score  3        Pulmonary Function Assessment:   Exercise Target Goals: Exercise Program Goal: Individual exercise prescription set using results from initial 6 min walk test and THRR while considering  patient's activity barriers and safety.   Exercise Prescription Goal: Initial exercise prescription builds to 30-45 minutes a day of aerobic activity, 2-3 days per week.  Home exercise guidelines will be given to patient during program as part of exercise prescription that the participant will acknowledge.  Activity Barriers & Risk Stratification: Activity Barriers & Cardiac Risk Stratification - 07/06/18 1405      Activity Barriers & Cardiac Risk Stratification   Activity Barriers  (S) Shortness of Breath;Balance Concerns;None;Neck/Spine Problems   meniere's disease   Cardiac Risk Stratification  Moderate       6 Minute Walk: 6 Minute Walk    Row Name 07/13/18 1006         6 Minute Walk   Phase  Initial      Distance  800 feet     Walk Time  6 minutes     # of Rest Breaks  0     MPH  1.5     METS  2.15     RPE  13     Perceived Dyspnea   3     Symptoms  Yes (  comment)     Comments  used wheelchair     Resting HR  69 bpm     Resting BP  120/64     Resting Oxygen Saturation   97 %     Exercise Oxygen Saturation  during 6 min walk  95 %     Max Ex. HR  85 bpm     Max Ex. BP  130/60       Interval HR   1 Minute HR  73     2 Minute HR  81     3 Minute HR  85     4 Minute HR  79     5 Minute HR  85     6 Minute HR  80     2 Minute Post HR  73     Interval Heart Rate?  Yes       Interval Oxygen   Interval Oxygen?  Yes     Baseline Oxygen Saturation %  97 %     1 Minute Oxygen Saturation %  98 %     1 Minute Liters of Oxygen  3 L     2 Minute Oxygen Saturation %  96 %     2 Minute Liters of Oxygen  3 L     3 Minute Oxygen Saturation %  95 %     3 Minute Liters of Oxygen  3 L     4 Minute Oxygen Saturation %  95 %     4 Minute Liters of Oxygen  3 L     5 Minute Oxygen Saturation %  96 %     5 Minute Liters of Oxygen  3 L     6 Minute Oxygen Saturation %  97 %     6 Minute Liters of Oxygen  3 L     2 Minute Post Oxygen Saturation %  98 %     2 Minute Post Liters of Oxygen  3 L        Oxygen Initial Assessment: Oxygen Initial Assessment - 07/13/18 0930      Initial 6 min Walk   Oxygen Used  Continuous;E-Tanks    Liters per minute  3      Program Oxygen Prescription   Program Oxygen Prescription  Continuous;E-Tanks    Liters per minute  3       Oxygen Re-Evaluation: Oxygen Re-Evaluation    Row Name 08/03/18 1624 09/01/18 1603 09/30/18 0744         Program Oxygen Prescription   Program Oxygen Prescription  Continuous;E-Tanks  Continuous;E-Tanks  Continuous;E-Tanks     Liters per minute  3  3  2        Home Oxygen   Home Oxygen Device  Portable Concentrator;Home Concentrator  Portable Concentrator;Home Concentrator  Portable Concentrator;Home Concentrator     Sleep  Oxygen Prescription  Continuous;CPAP  Continuous;CPAP  Continuous;CPAP     Liters per minute  3  3  2      Home Exercise Oxygen Prescription  Continuous  Continuous  Continuous     Liters per minute  3  3  2      Home at Rest Exercise Oxygen Prescription  Continuous  Continuous  Continuous     Liters per minute  2  2  2      Compliance with Home Oxygen Use  Yes  Yes  Yes       Goals/Expected Outcomes   Short Term Goals  To learn and exhibit compliance with exercise, home and travel O2 prescription;To learn and understand importance of monitoring SPO2 with pulse oximeter and demonstrate accurate use of the pulse oximeter.;To learn and understand importance of maintaining oxygen saturations>88%;To learn and demonstrate proper pursed lip breathing techniques or other breathing techniques.;To learn and demonstrate proper use of respiratory medications  To learn and exhibit compliance with exercise, home and travel O2 prescription;To learn and understand importance of monitoring SPO2 with pulse oximeter and demonstrate accurate use of the pulse oximeter.;To learn and understand importance of maintaining oxygen saturations>88%;To learn and demonstrate proper pursed lip breathing techniques or other breathing techniques.;To learn and demonstrate proper use of respiratory medications  -     Long  Term Goals  Exhibits compliance with exercise, home and travel O2 prescription;Verbalizes importance of monitoring SPO2 with pulse oximeter and return demonstration;Maintenance of O2 saturations>88%;Exhibits proper breathing techniques, such as pursed lip breathing or other method taught during program session;Compliance with respiratory medication;Demonstrates proper use of MDI's  Exhibits compliance with exercise, home and travel O2 prescription;Verbalizes importance of monitoring SPO2 with pulse oximeter and return demonstration;Maintenance of O2 saturations>88%;Exhibits proper breathing techniques, such as pursed lip  breathing or other method taught during program session;Compliance with respiratory medication;Demonstrates proper use of MDI's  Exhibits compliance with exercise, home and travel O2 prescription;Verbalizes importance of monitoring SPO2 with pulse oximeter and return demonstration;Maintenance of O2 saturations>88%;Exhibits proper breathing techniques, such as pursed lip breathing or other method taught during program session;Compliance with respiratory medication;Demonstrates proper use of MDI's     Goals/Expected Outcomes  compliance  compliance  compliance        Oxygen Discharge (Final Oxygen Re-Evaluation): Oxygen Re-Evaluation - 09/30/18 0744      Program Oxygen Prescription   Program Oxygen Prescription  Continuous;E-Tanks    Liters per minute  2      Home Oxygen   Home Oxygen Device  Portable Concentrator;Home Concentrator    Sleep Oxygen Prescription  Continuous;CPAP    Liters per minute  2    Home Exercise Oxygen Prescription  Continuous    Liters per minute  2    Home at Rest Exercise Oxygen Prescription  Continuous    Liters per minute  2    Compliance with Home Oxygen Use  Yes      Goals/Expected Outcomes   Long  Term Goals  Exhibits compliance with exercise, home and travel O2 prescription;Verbalizes importance of monitoring SPO2 with pulse oximeter and return demonstration;Maintenance of O2 saturations>88%;Exhibits proper breathing techniques, such as pursed lip breathing or other method taught during program session;Compliance with respiratory medication;Demonstrates proper use of MDI's    Goals/Expected Outcomes  compliance       Initial Exercise Prescription: Initial Exercise Prescription - 07/13/18 1000      Date of Initial Exercise RX and Referring Provider   Date  07/13/18    Referring Provider  Dr. Lenna Gilford      Oxygen   Oxygen  Continuous    Liters  3      NuStep   Level  2    SPM  80    Minutes  17    METs  1.5      Arm Ergometer   Level  2     Watts  10    Minutes  17      Track   Laps  5    Minutes  17      Prescription Details   Frequency (times per week)  2    Duration  Progress to 45 minutes of aerobic exercise without signs/symptoms of physical distress      Intensity   THRR 40-80% of Max Heartrate  57-114    Ratings of Perceived Exertion  11-13    Perceived Dyspnea  0-4      Progression   Progression  Continue progressive overload as per policy without signs/symptoms or physical distress.      Resistance Training   Training Prescription  Yes    Weight  blue bands    Reps  10-15       Perform Capillary Blood Glucose checks as needed.  Exercise Prescription Changes: Exercise Prescription Changes    Row Name 07/21/18 1200 07/30/18 1003 08/04/18 1300 08/18/18 1500 09/01/18 1200     Response to Exercise   Blood Pressure (Admit)  112/62  -  104/62  134/70  118/50   Blood Pressure (Exercise)  120/56  -  138/72  130/72  146/72   Blood Pressure (Exit)  102/58  -  128/76  122/56  106/50   Heart Rate (Admit)  54 bpm  -  94 bpm  76 bpm  78 bpm   Heart Rate (Exercise)  74 bpm  -  113 bpm  126 bpm  107 bpm   Heart Rate (Exit)  57 bpm  -  90 bpm  76 bpm  85 bpm   Oxygen Saturation (Admit)  99 %  -  98 %  98 %  100 %   Oxygen Saturation (Exercise)  97 %  -  94 %  87 %  95 %   Oxygen Saturation (Exit)  100 %  -  99 %  99 %  94 %   Rating of Perceived Exertion (Exercise)  12  -  14  13  13    Perceived Dyspnea (Exercise)  2  -  3  3  2    Duration  Progress to 45 minutes of aerobic exercise without signs/symptoms of physical distress  -  Progress to 45 minutes of aerobic exercise without signs/symptoms of physical distress  Progress to 45 minutes of aerobic exercise without signs/symptoms of physical distress  Progress to 45 minutes of aerobic exercise without signs/symptoms of physical distress   Intensity  THRR unchanged  -  THRR unchanged  THRR unchanged  THRR unchanged     Progression   Progression  Continue to  progress workloads to maintain intensity without signs/symptoms of physical distress.  -  Continue to progress workloads to maintain intensity without signs/symptoms of physical distress.  Continue to progress workloads to maintain intensity without signs/symptoms of physical distress.  Continue to progress workloads to maintain intensity without signs/symptoms of physical distress.     Resistance Training   Training Prescription  Yes  -  Yes  Yes  Yes   Weight  blue bands  -  orange bands  orange bands  orange bands   Reps  10-15  -  10-15  10-15  10-15   Time  -  -  10 Minutes  10 Minutes  10 Minutes     Interval Training   Interval Training  -  -  No  No  No     Oxygen   Oxygen  Continuous  -  -  Continuous  Continuous   Liters  3  -  -  2  2     Treadmill   MPH  1.3  -  1.5  1.5  1.5   Grade  0  -  0  0  0   Minutes  17  -  17  17  17      NuStep   Level  2  -  2  3  3    SPM  80  -  80  80  80   Minutes  17  -  17  17  17    METs  1.6  -  1.6  -  1.8     Arm Ergometer   Level  2  -  2  2  2    Watts  10  -  10  10  10    Minutes  17  -  17  17  17      Home Exercise Plan   Plans to continue exercise at  -  Home (comment)  -  -  -   Frequency  -  Add 2 additional days to program exercise sessions.  -  -  -   Row Name 09/15/18 1200 09/29/18 1300           Response to Exercise   Blood Pressure (Admit)  118/86  110/60      Blood Pressure (Exercise)  106/50  110/60      Blood Pressure (Exit)  110/64  112/70      Heart Rate (Admit)  96 bpm  94 bpm      Heart Rate (Exercise)  116 bpm  120 bpm      Heart Rate (Exit)  97 bpm  103 bpm      Oxygen Saturation (Admit)  99 %  98 %      Oxygen Saturation (Exercise)  96 %  96 %      Oxygen Saturation (Exit)  97 %  100 %      Rating of Perceived Exertion (Exercise)  14  15      Perceived Dyspnea (Exercise)  2  3      Duration  Progress to 45 minutes of aerobic exercise without signs/symptoms of physical distress  Progress to 45  minutes of aerobic exercise without signs/symptoms of physical distress      Intensity  THRR unchanged  THRR unchanged        Progression   Progression  Continue to progress workloads to maintain intensity without signs/symptoms of physical distress.  Continue to progress workloads to maintain intensity without signs/symptoms of physical distress.        Resistance Training   Training Prescription  Yes  Yes      Weight  orange bands  orange bands      Reps  10-15  10-15      Time  10 Minutes  10 Minutes        Interval Training   Interval Training  No  No        Oxygen   Oxygen  Continuous  Continuous      Liters  2  2        Treadmill   MPH  1.5  1 not feeling well, on z-pak      Grade  0  0      Minutes  17  17        NuStep   Level  3  3      SPM  80  80      Minutes  17  17      METs  1.3  1.3  Arm Ergometer   Level  2  1.5      Watts  10  10      Minutes  17  17         Exercise Comments: Exercise Comments    Row Name 07/31/18 1003           Exercise Comments  Home exercise completed          Exercise Goals and Review: Exercise Goals    [place] Name 07/06/18 1406             Exercise Goals   Increase Physical Activity  Yes       Intervention  Provide advice, education, support and counseling about physical activity/exercise needs.;Develop an individualized exercise prescription for aerobic and resistive training based on initial evaluation findings, risk stratification, comorbidities and participant's personal goals.       Expected Outcomes  Short Term: Attend rehab on a regular basis to increase amount of physical activity.;Long Term: Add in home exercise to make exercise part of routine and to increase amount of physical activity.;Long Term: Exercising regularly at least 3-5 days a week.       Increase Strength and Stamina  Yes       Intervention  Provide advice, education, support and counseling about physical activity/exercise needs.;Develop an  individualized exercise prescription for aerobic and resistive training based on initial evaluation findings, risk stratification, comorbidities and participant's personal goals.       Expected Outcomes  Short Term: Increase workloads from initial exercise prescription for resistance, speed, and METs.;Short Term: Perform resistance training exercises routinely during rehab and add in resistance training at home;Long Term: Improve cardiorespiratory fitness, muscular endurance and strength as measured by increased METs and functional capacity (6MWT)       Able to understand and use rate of perceived exertion (RPE) scale  Yes       Intervention  Provide education and explanation on how to use RPE scale       Expected Outcomes  Short Term: Able to use RPE daily in rehab to express subjective intensity level;Long Term:  Able to use RPE to guide intensity level when exercising independently       Able to understand and use Dyspnea scale  Yes       Intervention  Provide education and explanation on how to use Dyspnea scale       Expected Outcomes  Short Term: Able to use Dyspnea scale daily in rehab to express subjective sense of shortness of breath during exertion;Long Term: Able to use Dyspnea scale to guide intensity level when exercising independently       Knowledge and understanding of Target Heart Rate Range (THRR)  Yes       Intervention  Provide education and explanation of THRR including how the numbers were predicted and where they are located for reference       Expected Outcomes  Short Term: Able to state/look up THRR;Short Term: Able to use daily as guideline for intensity in rehab;Long Term: Able to use THRR to govern intensity when exercising independently       Understanding of Exercise Prescription  Yes       Intervention  Provide education, explanation, and written materials on patient's individual exercise prescription       Expected Outcomes  Short Term: Able to explain program exercise  prescription;Long Term: Able to explain home exercise prescription to exercise independently  Exercise Goals Re-Evaluation : Exercise Goals Re-Evaluation    Row Name 08/03/18 1625 09/01/18 1605 09/30/18 0745         Exercise Goal Re-Evaluation   Exercise Goals Review  Increase Physical Activity;Increase Strength and Stamina;Able to understand and use rate of perceived exertion (RPE) scale;Able to understand and use Dyspnea scale;Knowledge and understanding of Target Heart Rate Range (THRR);Understanding of Exercise Prescription  Increase Physical Activity;Increase Strength and Stamina;Able to understand and use rate of perceived exertion (RPE) scale;Able to understand and use Dyspnea scale;Knowledge and understanding of Target Heart Rate Range (THRR);Understanding of Exercise Prescription  Increase Physical Activity;Increase Strength and Stamina;Able to understand and use rate of perceived exertion (RPE) scale;Able to understand and use Dyspnea scale;Knowledge and understanding of Target Heart Rate Range (THRR);Understanding of Exercise Prescription     Comments  Patient is making slow progress. Can be gaurded at times with how hard he pushes himself. MET average places him in a lower level. Is motivated to make changes. Home exercise was discussed.. Patient wants to get back to walking 45 minutes at a time. Will cont. to monitor and motivate as able.   Patient is making slow progress. Can be gaurded at times with how hard he pushes himself. MET average places him in a lower level. Is motivated to make changes. Home exercise was discussed.. Patient wants to get back to walking 45 minutes at a time. Will cont. to monitor and motivate as able.   Pt has made slow progress. He is gaurded with how hard that he pushes himself and exercises at a low level of METs (~1.7). Pt will graduate next week and is motivated to continue exercise, as he reports that he will join the [facility]. Will continue to monitor  and motivate as able.      Expected Outcomes  Through exercise at rehab and at home, the patient will decrease shortness of breath with daily activities and feel confident in carrying out an exercise regime at home.   Through exercise at rehab and at home, the patient will decrease shortness of breath with daily activities and feel confident in carrying out an exercise regime at home.   Through exercise at rehab and at home, the patient will decrease shortness of breath with daily activities and feel confident in carrying out an exercise regime at home.         Discharge Exercise Prescription (Final Exercise Prescription Changes): Exercise Prescription Changes - 09/29/18 1300      Response to Exercise   Blood Pressure (Admit)  110/60    Blood Pressure (Exercise)  110/60    Blood Pressure (Exit)  112/70    Heart Rate (Admit)  94 bpm    Heart Rate (Exercise)  120 bpm    Heart Rate (Exit)  103 bpm    Oxygen Saturation (Admit)  98 %    Oxygen Saturation (Exercise)  96 %    Oxygen Saturation (Exit)  100 %    Rating of Perceived Exertion (Exercise)  15    Perceived Dyspnea (Exercise)  3    Duration  Progress to 45 minutes of aerobic exercise without signs/symptoms of physical distress    Intensity  THRR unchanged      Progression   Progression  Continue to progress workloads to maintain intensity without signs/symptoms of physical distress.      Resistance Training   Training Prescription  Yes    Weight  orange bands    Reps  10-15  Time  10 Minutes      Interval Training   Interval Training  No      Oxygen   Oxygen  Continuous    Liters  2      Treadmill   MPH  1   not feeling well, on z-pak   Grade  0    Minutes  17      NuStep   Level  3    SPM  80    Minutes  17    METs  1.3      Arm Ergometer   Level  1.5    Watts  10    Minutes  17       Nutrition:  Target Goals: Understanding of nutrition guidelines, daily intake of sodium <1544m, cholesterol <2062m  calories 30% from fat and 7% or less from saturated fats, daily to have 5 or more servings of fruits and vegetables.  Biometrics:    Nutrition Therapy Plan and Nutrition Goals: Nutrition Therapy & Goals - 07/07/18 0830      Nutrition Therapy   Diet  general healthful      Personal Nutrition Goals   Nutrition Goal  identify and limit dietary sources of sodium    Personal Goal #2  Identify food quantities necessary to achieve wt maintenance at graduation from pulmonary rehab      Intervention Plan   Intervention  Prescribe, educate and counsel regarding individualized specific dietary modifications aiming towards targeted core components such as weight, hypertension, lipid management, diabetes, heart failure and other comorbidities.    Expected Outcomes  Short Term Goal: Understand basic principles of dietary content, such as calories, fat, sodium, cholesterol and nutrients.       Nutrition Assessments: Nutrition Assessments - 07/07/18 0827      Rate Your Plate Scores   Pre Score  51       Nutrition Goals Re-Evaluation: Nutrition Goals Re-Evaluation    [place] 07/07/18 0827 07/07/18 0830           Goals   Current Weight  142 lb 13.7 oz (64.8 kg)  142 lb 13.7 oz (64.8 kg)      Nutrition Goal  identify and limit dietary sources of sodium  -      Comment  -  Pt wt is down 2 lb since admission         Nutrition Goals Discharge (Final Nutrition Goals Re-Evaluation): Nutrition Goals Re-Evaluation - 07/07/18 0830      Goals   Current Weight  142 lb 13.7 oz (64.8 kg)    Comment  Pt wt is down 2 lb since admission       Psychosocial: Target Goals: Acknowledge presence or absence of significant depression and/or stress, maximize coping skills, provide positive support system. Participant is able to verbalize types and ability to use techniques and skills needed for reducing stress and depression.  Initial Review & Psychosocial Screening: Initial Psych Review & Screening  - 07/06/18 1411      Initial Review   Current issues with  None Identified      Family Dynamics   Good Support System?  Yes    Comments  lives with daughter who is very supportive      Barriers   Psychosocial barriers to participate in program  There are no identifiable barriers or psychosocial needs.      Screening Interventions   Interventions  Encouraged to exercise    Expected Outcomes  Short Term goal:  Identification and review with participant of any Quality of Life or Depression concerns found by scoring the questionnaire.;Long Term goal: The participant improves quality of Life and PHQ9 Scores as seen by post scores and/or verbalization of changes       Quality of Life Scores:  Scores of 19 and below usually indicate a poorer quality of life in these areas.  A difference of  2-3 points is a clinically meaningful difference.  A difference of 2-3 points in the total score of the Quality of Life Index has been associated with significant improvement in overall quality of life, self-image, physical symptoms, and general health in studies assessing change in quality of life.  PHQ-9: Recent Review Flowsheet Data    Depression screen [facility] 2/9 07/06/2018 08/15/2016 06/20/2016 04/29/2016   Decreased Interest 0 0 0 0   Down, Depressed, Hopeless 0 0 0 0   PHQ - 2 Score 0 0 0 0   Altered sleeping 1 - - -   Tired, decreased energy 0 - - -   Change in appetite 0 - - -   Feeling bad or failure about yourself  0 - - -   Trouble concentrating 0 - - -   Moving slowly or fidgety/restless 0 - - -   Suicidal thoughts 0 - - -   PHQ-9 Score 1 - - -   Difficult doing work/chores Not difficult at all - - -     Interpretation of Total Score  Total Score Depression Severity:  1-4 = Minimal depression, 5-9 = Mild depression, 10-14 = Moderate depression, 15-19 = Moderately severe depression, 20-27 = Severe depression   Psychosocial Evaluation and Intervention: Psychosocial Evaluation - 09/30/18 1123       Psychosocial Evaluation & Interventions   Interventions  Encouraged to exercise with the program and follow exercise prescription    Comments  None identified, pt is delighful and interacts well with other participants.  Participated in pulmonary rehab before    Expected Outcomes  Pt will continue to display positve and healthy coping skills.      Continue Psychosocial Services   Follow up required by staff       Psychosocial Re-Evaluation: Psychosocial Re-Evaluation    [place] Name 08/05/18 1209 09/02/18 1753 09/30/18 1123         Psychosocial Re-Evaluation   Current issues with  None Identified  None Identified  -     Comments  no psychosocial barriers identified   no psychosocial barriers identified   no psychosocial barriers identified      Expected Outcomes  Pt will continue to display mental well being   Pt will continue to display mental well being   Pt will continue to display mental well being      Interventions  Stress management education;Physician referral;Encouraged to attend Pulmonary Rehabilitation for the exercise  Stress management education;Physician referral;Encouraged to attend Pulmonary Rehabilitation for the exercise  Stress management education;Physician referral;Encouraged to attend Pulmonary Rehabilitation for the exercise     Continue Psychosocial Services   -  No Follow up required  Follow up required by staff        Psychosocial Discharge (Final Psychosocial Re-Evaluation): Psychosocial Re-Evaluation - 09/30/18 1123      Psychosocial Re-Evaluation   Comments  no psychosocial barriers identified     Expected Outcomes  Pt will continue to display mental well being     Interventions  Stress management education;Physician referral;Encouraged to attend Pulmonary Rehabilitation for the exercise  Continue Psychosocial Services   Follow up required by staff       Education: Education Goals: Education classes will be provided on a weekly basis, covering  required topics. Participant will state understanding/return demonstration of topics presented.  Learning Barriers/Preferences: Learning Barriers/Preferences - 07/06/18 1412      Learning Barriers/Preferences   Learning Barriers  Sight;Hearing    Learning Preferences  Written Material;Skilled Demonstration       Education Topics: Risk Factor Reduction:  -Group instruction that is supported by a PowerPoint presentation. Instructor discusses the definition of a risk factor, different risk factors for pulmonary disease, and how the heart and lungs work together.     Nutrition for Pulmonary Patient:  -Group instruction provided by PowerPoint slides, verbal discussion, and written materials to support subject matter. The instructor gives an explanation and review of healthy diet recommendations, which includes a discussion on weight management, recommendations for fruit and vegetable consumption, as well as protein, fluid, caffeine, fiber, sodium, sugar, and alcohol. Tips for eating when patients are short of breath are discussed.   PULMONARY REHAB OTHER RESPIRATORY from 09/24/2018 in [place]  Date  07/16/18  Educator  Rodman Pickle  Instruction Review Code  2- Demonstrated Understanding      Pursed Lip Breathing:  -Group instruction that is supported by demonstration and informational handouts. Instructor discusses the benefits of pursed lip and diaphragmatic breathing and detailed demonstration on how to preform both.     PULMONARY REHAB OTHER RESPIRATORY from 08/08/2016 in [place]  Date  07/25/16  Educator  RT  Instruction Review Code (Retired)  R- Review/reinforce      Oxygen Safety:  -Group instruction provided by PowerPoint, verbal discussion, and written material to support subject matter. There is an overview of "What is Oxygen" and "Why do we need it".  Instructor also reviews how to create a safe environment for  oxygen use, the importance of using oxygen as prescribed, and the risks of noncompliance. There is a brief discussion on traveling with oxygen and resources the patient may utilize.   PULMONARY REHAB OTHER RESPIRATORY from 09/24/2018 in [place]  Date  08/20/18  Educator  molly  Instruction Review Code  1- Verbalizes Understanding      Oxygen Equipment:  -Group instruction provided by [employer], written materials, and Insurance underwriter.   PULMONARY REHAB OTHER RESPIRATORY from 09/24/2018 in [place]  Date  08/27/18  Educator  [employer]  Instruction Review Code  1- Verbalizes Understanding      Signs and Symptoms:  -Group instruction provided by written material and verbal discussion to support subject matter. Warning signs and symptoms of infection, stroke, and heart attack are reviewed and when to call the physician/911 reinforced. Tips for preventing the spread of infection discussed.   PULMONARY REHAB OTHER RESPIRATORY from 09/24/2018 in [place]  Date  08/13/18  Educator  RN  Instruction Review Code  2- Demonstrated Understanding      Advanced Directives:  -Group instruction provided by verbal instruction and written material to support subject matter. Instructor reviews Advanced Directive laws and proper instruction for filling out document.   Pulmonary Video:  -Group video education that reviews the importance of medication and oxygen compliance, exercise, good nutrition, pulmonary hygiene, and pursed lip and diaphragmatic breathing for the pulmonary patient.   PULMONARY REHAB OTHER RESPIRATORY from 08/08/2016 in [place]  [place]  Date  07/18/16  Instruction Review Code (Retired)  2- meets goals/outcomes      Exercise for the Pulmonary Patient:  -Group instruction that is supported by a PowerPoint presentation.  Instructor discusses benefits of exercise, core components of exercise, frequency, duration, and intensity of an exercise routine, importance of utilizing pulse oximetry during exercise, safety while exercising, and options of places to exercise outside of rehab.     PULMONARY REHAB OTHER RESPIRATORY from 09/24/2018 in [place]  Date  09/03/18  Educator  EP  Instruction Review Code  1- Verbalizes Understanding      Pulmonary Medications:  -Verbally interactive group education provided by instructor with focus on inhaled medications and proper administration.   PULMONARY REHAB OTHER RESPIRATORY from 09/24/2018 in [place]  Date  07/28/18  Educator  pharm  Instruction Review Code  2- Demonstrated Understanding      Anatomy and Physiology of the Respiratory System and Intimacy:  -Group instruction provided by PowerPoint, verbal discussion, and written material to support subject matter. Instructor reviews respiratory cycle and anatomical components of the respiratory system and their functions. Instructor also reviews differences in obstructive and restrictive respiratory diseases with examples of each. Intimacy, Sex, and Sexuality differences are reviewed with a discussion on how relationships can change when diagnosed with pulmonary disease. Common sexual concerns are reviewed.   PULMONARY REHAB OTHER RESPIRATORY from 09/24/2018 in [place]  Date  09/24/18  Educator  RN  Instruction Review Code  2- Demonstrated Understanding      MD DAY -A group question and answer session with a medical doctor that allows participants to ask questions that relate to their pulmonary disease state.   PULMONARY REHAB OTHER RESPIRATORY from 09/24/2018 in [place]  Date  09/10/18  Educator  yacoub  Instruction Review Code  1- Verbalizes Understanding      OTHER  EDUCATION -Group or individual verbal, written, or video instructions that support the educational goals of the pulmonary rehab program.   PULMONARY REHAB OTHER RESPIRATORY from 09/24/2018 in [place]  Date  08/06/18  Educator  Cloyde Reams  Instruction Review Code  1- Verbalizes Understanding [Sedentary Lifestyle]      Holiday Eating Survival Tips:  -Group instruction provided by [employer], verbal discussion, and written materials to support subject matter. The instructor gives patients tips, tricks, and techniques to help them not only survive but enjoy the holidays despite the onslaught of food that accompanies the holidays.   Knowledge Questionnaire Score:   Core Components/Risk Factors/Patient Goals at Admission: Personal Goals and Risk Factors at Admission - 07/06/18 1415      Core Components/Risk Factors/Patient Goals on Admission    Weight Management  Yes;Weight Maintenance    Admit Weight  142 lb 13.7 oz (64.8 kg)    Goal Weight: Long Term  140 lb (63.5 kg)    Expected Outcomes  Understanding of distribution of calorie intake throughout the day with the consumption of 4-5 meals/snacks;Understanding recommendations for meals to include 15-35% energy as protein, 25-35% energy from fat, 35-60% energy from carbohydrates, less than 253m of dietary cholesterol, 20-35 gm of total fiber daily;Weight Maintenance: Understanding of the daily nutrition guidelines, which includes 25-35% calories from fat, 7% or less cal from saturated fats, less than 2021mcholesterol, less than 1.5gm of sodium, & 5 or more servings of fruits and  vegetables daily;Short Term: Continue to assess and modify interventions until short term weight is achieved;Long Term: Adherence to nutrition and physical activity/exercise program aimed toward attainment of established weight goal    Improve shortness of breath with ADL's  Yes    Intervention  Provide education, individualized  exercise plan and daily activity instruction to help decrease symptoms of SOB with activities of daily living.    Expected Outcomes  Short Term: Improve cardiorespiratory fitness to achieve a reduction of symptoms when performing ADLs;Long Term: Be able to perform more ADLs without symptoms or delay the onset of symptoms       Core Components/Risk Factors/Patient Goals Review:  Goals and Risk Factor Review    [place] Name 08/05/18 1210 09/02/18 1753 09/30/18 1124         Core Components/Risk Factors/Patient Goals Review   Personal Goals Review  Weight Management/Obesity;Develop more efficient breathing techniques such as purse lipped breathing and diaphragmatic breathing and practicing self-pacing with activity.;Increase knowledge of respiratory medications and ability to use respiratory devices properly.;Improve shortness of breath with ADL's  Weight Management/Obesity;Develop more efficient breathing techniques such as purse lipped breathing and diaphragmatic breathing and practicing self-pacing with activity.;Increase knowledge of respiratory medications and ability to use respiratory devices properly.;Improve shortness of breath with ADL's  Weight Management/Obesity;Develop more efficient breathing techniques such as purse lipped breathing and diaphragmatic breathing and practicing self-pacing with activity.;Increase knowledge of respiratory medications and ability to use respiratory devices properly.;Improve shortness of breath with ADL's     Review  Pt who is well known by rehab staff for pulmonary and cardiac rehab, is off to a great start.  Pt has completed 6 exercise sessions.  Pt has attended education on respiratory medications. Pt consistently reporrts level 2 for Dyspnea. Pt demonstrates appropriate technique for PLB and  self pacing.  Pt exercise  wtih level 2.0 on the bike, Treadmill 1.5/0 and nustep level 2.  Anticiapte that pt will show progress in workloads in the next 30 day assessnent.    Pt has completed 14 exercise sessions and 6 education classes.  Pt consistently reporrts level 2 for Dyspnea. Pt demonstrates appropriate technique for PLB and  self pacing.  Pt exercise  wtih level 2.0 on the arm crank Treadmill 1.5/0 and nustep increase to level 3.  Pt used his POC while on the treadmill.  Pt tried 1.6/1.0 but had to decrease back to 1.5/0 due to RPE 17, HR 126 and o2 sat 87 inc to 3l on the POC.   Anticiapte that pt will show progress in workloads in the next 30 day assessnent.   Pt has completed 22 exercise sessions and 7 education classes.  Pt consistently reporrts level 2 for Dyspnea. Pt demonstrates appropriate technique for PLB and  self pacing.  Pt with minor setback due to respiratory infection.  pt having some trouble bouncing back. Pt exercise  wtih level 1.8on the arm crank Treadmill 1.0/0 and nustep remains  level 3.   Pt is nearing graduations.  Continue to monitor progress.      Expected Outcomes  See Admission Goals/Outcomes  See Admission Goals/Outcomes  See Admission Goals/Outcomes        Core Components/Risk Factors/Patient Goals at Discharge (Final Review):  Goals and Risk Factor Review - 09/30/18 1124      Core Components/Risk Factors/Patient Goals Review   Personal Goals Review  Weight Management/Obesity;Develop more efficient breathing techniques such as purse lipped breathing and diaphragmatic breathing and practicing self-pacing with activity.;Increase knowledge of  respiratory medications and ability to use respiratory devices properly.;Improve shortness of breath with ADL's    Review   Pt has completed 22 exercise sessions and 7 education classes.  Pt consistently reporrts level 2 for Dyspnea. Pt demonstrates appropriate technique for PLB and  self pacing.  Pt with minor setback due to respiratory infection.  pt having some trouble bouncing back. Pt exercise  wtih level 1.8on the arm crank Treadmill 1.0/0 and nustep remains  level 3.   Pt is nearing graduations.   Continue to monitor progress.     Expected Outcomes  See Admission Goals/Outcomes       ITP Comments: ITP Comments    Row Name 07/06/18 1350 08/05/18 1208 09/02/18 1753 09/30/18 1123     ITP Comments  Dr. Jennet Maduro, Medical Director  Dr. Jennet Maduro, Medical Director Pulmonary Rehab  Dr. Jennet Maduro, Medical Director Pulmonary Rehab  Dr. Jennet Maduro, Medical Director Pulmonary Rehab       Comments: Pt has completed 22 exercise sessions.  Continue to monitor pt progress during the next 30 day asssessment. Cherre Huger, BSN Cardiac and Training and development officer

## 2018-10-01 ENCOUNTER — Encounter (HOSPITAL_COMMUNITY)
Admission: RE | Admit: 2018-10-01 | Discharge: 2018-10-01 | Disposition: A | Discharge status: Home / Self Care | Payer: Medicare Other | Source: Ambulatory Visit | Attending: Pulmonary Disease | Admitting: Pulmonary Disease

## 2018-10-01 DIAGNOSIS — J439 Emphysema, unspecified: Secondary | ICD-10-CM

## 2018-10-01 DIAGNOSIS — J9611 Chronic respiratory failure with hypoxia: Secondary | ICD-10-CM | POA: Diagnosis not present

## 2018-10-01 NOTE — Progress Notes (Signed)
I have reviewed the pulmonary graduate packet with Andrew [name] . Andrew [name] is scheduled to graduate on 10/08/2018. The patient was given an exercise prescription and stated that they planned to join the [facility] to continue exercising which include walking the track and treadmill and using the stationary bike.  Andrew [name] and I discussed how to progress their exercise prescription.  The patient stated that they understand the exercise prescription.  We reviewed exercise guidelines, target heart rate during exercise, RPE Scale, weather conditions, NTG use, endpoints for exercise, warmup and cool down.  The patient was also given information regarding the pulmonary maintenance program and given homework to complete and return before graduation. Patient is encouraged to come to me with any questions.

## 2018-10-01 NOTE — Progress Notes (Signed)
Daily Session Note  Patient Details  Name: Andrew [name] MRN: [id number] Date of Birth: Aug 09, 1941 Referring Provider:     Pulmonary Rehab Walk Test from 07/09/2018 in [place]  Referring Provider  Dr. Lenna Gilford      Encounter Date: 10/01/2018  Check In: Session Check In - 10/01/18 1030      Check-In   Supervising physician immediately available to respond to emergencies  [facility] immediately available    Physician(s)  Dr. Maryland Pink    Location  [facility]    Staff Present  Rosebud Poles, RN, BSN;[name], RN, Bjorn Loser, MS, Exercise Physiologist;Lisa Ysidro Evert, RN    Medication changes reported      No    Fall or balance concerns reported     No    Tobacco Cessation  No Change    Warm-up and Cool-down  Performed as group-led instruction    Resistance Training Performed  Yes    VAD Patient?  No    PAD/SET Patient?  No      Pain Assessment   Currently in Pain?  No/denies    Multiple Pain Sites  No       Capillary Blood Glucose: No results found for this or any previous visit (from the past 24 hour(s)).    Social History   Tobacco Use  Smoking Status Former Smoker  . Packs/day: 0.00  . Years: 0.00  . Pack years: 0.00  . Types: Cigarettes  . Last attempt to quit: 10/01/1984  . Years since quitting: 34.0  Smokeless Tobacco Never Used    Goals Met:  Proper associated with RPD/PD & O2 Sat Exercise tolerated well  Goals Unmet:  Not Applicable  Comments: Service time is from 1030 to 1205    Dr. Rush Farmer is Medical Director for Pulmonary Rehab at [facility].

## 2018-10-06 ENCOUNTER — Encounter (HOSPITAL_COMMUNITY)
Admission: RE | Admit: 2018-10-06 | Discharge: 2018-10-06 | Disposition: A | Discharge status: Home / Self Care | Payer: Medicare Other | Source: Ambulatory Visit | Attending: Pulmonary Disease | Admitting: Pulmonary Disease

## 2018-10-06 DIAGNOSIS — J9611 Chronic respiratory failure with hypoxia: Secondary | ICD-10-CM | POA: Diagnosis not present

## 2018-10-06 DIAGNOSIS — J439 Emphysema, unspecified: Secondary | ICD-10-CM | POA: Diagnosis not present

## 2018-10-06 DIAGNOSIS — J438 Other emphysema: Secondary | ICD-10-CM

## 2018-10-08 ENCOUNTER — Encounter (HOSPITAL_COMMUNITY): Payer: Medicare Other

## 2018-10-13 ENCOUNTER — Encounter (HOSPITAL_COMMUNITY): Payer: Medicare Other

## 2018-10-20 ENCOUNTER — Encounter (HOSPITAL_COMMUNITY): Payer: Medicare Other

## 2018-10-26 ENCOUNTER — Telehealth: Payer: Self-pay | Admitting: Pulmonary Disease

## 2018-10-26 NOTE — Telephone Encounter (Signed)
Called and spoke with Patient.  He is needing refills for Spiriva.  Patient is requesting it go to [facility], phone [phone].  Called [employer], for patient assistance.  Spoke with Tommy.  Patient assistance for Patient runs out 11/14/18.  Konrad Dolores is faxing new Patient assistance form.  Once fax received, will contact Patient to come pick up new application, and will send it to [facility] with new prescription.  Patient does #90, with refills.  Will follow up

## 2018-10-27 NOTE — Telephone Encounter (Signed)
Lattie Haw, please advise if you have received these forms, thank you.

## 2018-10-27 NOTE — Telephone Encounter (Signed)
Forms not received.  Called for forms to be faxed to our office again. Will follow up.

## 2018-10-27 NOTE — Telephone Encounter (Signed)
Called and spoke with Bree at [facility].  New patient assistance form received.  Called and spoke with Patient.  Explained that his assistance runs out 11/14/18, per Tommy, 10/26/18.  Patinet stated that he had to spend so much money before he qualified.  He is going to call [employer], because he thought he could get 1 more refill.  Explained that I have the assistance forms, I can mail or he is going to come pick up.  Patient stated that he would call back, after he talks with [employer].  Nothing further at this time.

## 2018-11-15 NOTE — Addendum Note (Signed)
Encounter addended by: Rowe Pavy, RN on: 11/15/2018 11:42 PM  Actions taken: Episode resolved, Visit Navigator Flowsheet section accepted, Clinical Note Signed

## 2018-11-15 NOTE — Progress Notes (Signed)
Discharge Progress Report  Patient Details  Name: Andrew [name] MRN: [id number] Date of Birth: 1941/04/25 Referring Provider:     Pulmonary Rehab Walk Test from 07/09/2018 in [place]  Referring Provider  Dr. Lenna Gilford       Number of Visits: 24 exercise sessions and 10 education classes  Reason for Discharge:  Patient reached a stable level of exercise. Patient independent in their exercise. Patient has met program and personal goals.  Smoking History:  Social History   Tobacco Use  Smoking Status Former Smoker  . Packs/day: 0.00  . Years: 0.00  . Pack years: 0.00  . Types: Cigarettes  . Last attempt to quit: 10/01/1984  . Years since quitting: 34.1  Smokeless Tobacco Never Used    Diagnosis:  Pulmonary emphysema, unspecified emphysema type ([place])  Other emphysema ([place])  ADL [facility]: Pulmonary Assessment Scores    Row Name 07/07/18 2337 07/13/18 0933 10/01/18 1447     ADL [facility]   ADL Phase  Entry  Entry  Exit   SOB Score total  73  -  68     CAT Score   CAT Score  pre 24  -  post 17     mMRC Score   mMRC Score  -  3  -      Initial Exercise Prescription: Initial Exercise Prescription - 07/13/18 1000      Date of Initial Exercise RX and Referring Provider   Date  07/13/18    Referring Provider  Dr. Lenna Gilford      Oxygen   Oxygen  Continuous    Liters  3      NuStep   Level  2    SPM  80    Minutes  17    METs  1.5      Arm Ergometer   Level  2    Watts  10    Minutes  17      Track   Laps  5    Minutes  17      Prescription Details   Frequency (times per week)  2    Duration  Progress to 45 minutes of aerobic exercise without signs/symptoms of physical distress      Intensity   THRR 40-80% of Max Heartrate  57-114    Ratings of Perceived Exertion  11-13    Perceived Dyspnea  0-4      Progression   Progression  Continue progressive overload as per policy without signs/symptoms or physical distress.       Resistance Training   Training Prescription  Yes    Weight  blue bands    Reps  10-15       Discharge Exercise Prescription (Final Exercise Prescription Changes): Exercise Prescription Changes - 09/29/18 1300      Response to Exercise   Blood Pressure (Admit)  110/60    Blood Pressure (Exercise)  110/60    Blood Pressure (Exit)  112/70    Heart Rate (Admit)  94 bpm    Heart Rate (Exercise)  120 bpm    Heart Rate (Exit)  103 bpm    Oxygen Saturation (Admit)  98 %    Oxygen Saturation (Exercise)  96 %    Oxygen Saturation (Exit)  100 %    Rating of Perceived Exertion (Exercise)  15    Perceived Dyspnea (Exercise)  3    Duration  Progress to 45 minutes of aerobic exercise without signs/symptoms of physical  distress    Intensity  THRR unchanged      Progression   Progression  Continue to progress workloads to maintain intensity without signs/symptoms of physical distress.      Resistance Training   Training Prescription  Yes    Weight  orange bands    Reps  10-15    Time  10 Minutes      Interval Training   Interval Training  No      Oxygen   Oxygen  Continuous    Liters  2      Treadmill   MPH  1   not feeling well, on z-pak   Grade  0    Minutes  17      NuStep   Level  3    SPM  80    Minutes  17    METs  1.3      Arm Ergometer   Level  1.5    Watts  10    Minutes  17       Functional Capacity: [place] Name 07/13/18 1006 10/06/18 1219       6 Minute Walk   Phase  Initial  Discharge    Distance  800 feet  978 feet    Distance Feet Change  -  178 ft    Walk Time  6 minutes  6 minutes    # of Rest Breaks  0  0    MPH  1.5  1.85    METS  2.15  2.45    RPE  13  14    Perceived Dyspnea   3  3    Symptoms  Yes (comment)  No    Comments  used wheelchair  used wheelchair    Resting HR  69 bpm  88 bpm    Resting BP  120/64  104/56    Resting Oxygen Saturation   97 %  98 %    Exercise Oxygen Saturation  during 6 min walk  95 %  95 %     Max Ex. HR  85 bpm  117 bpm    Max Ex. BP  130/60  160/68    2 Minute Post BP  -  118/62      Interval HR   1 Minute HR  73  98    2 Minute HR  81  106    3 Minute HR  85  114    4 Minute HR  79  115    5 Minute HR  85  116    6 Minute HR  80  117    2 Minute Post HR  73  88    Interval Heart Rate?  Yes  Yes      Interval Oxygen   Interval Oxygen?  Yes  Yes    Baseline Oxygen Saturation %  97 %  98 %    1 Minute Oxygen Saturation %  98 %  98 %    1 Minute Liters of Oxygen  3 L  2 L    2 Minute Oxygen Saturation %  96 %  97 %    2 Minute Liters of Oxygen  3 L  2 L    3 Minute Oxygen Saturation %  95 %  96 %    3 Minute Liters of Oxygen  3 L  2 L    4 Minute Oxygen Saturation %  95 %  95 %    4 Minute Liters of Oxygen  3 L  2 L    5 Minute Oxygen Saturation %  96 %  95 %    5 Minute Liters of Oxygen  3 L  2 L    6 Minute Oxygen Saturation %  97 %  95 %    6 Minute Liters of Oxygen  3 L  2 L    2 Minute Post Oxygen Saturation %  98 %  99 %    2 Minute Post Liters of Oxygen  3 L  2 L       Psychological, QOL, Others - Outcomes: PHQ 2/9: Depression screen [facility] 2/9 10/06/2018 07/06/2018 08/15/2016 06/20/2016 04/29/2016  Decreased Interest 0 0 0 0 0  Down, Depressed, Hopeless 0 0 0 0 0  PHQ - 2 Score 0 0 0 0 0  Altered sleeping 1 1 - - -  Tired, decreased energy 0 0 - - -  Change in appetite 1 0 - - -  Feeling bad or failure about yourself  0 0 - - -  Trouble concentrating 0 0 - - -  Moving slowly or fidgety/restless 0 0 - - -  Suicidal thoughts 0 0 - - -  PHQ-9 Score 2 1 - - -  Difficult doing work/chores Not difficult at all Not difficult at all - - -    Quality of Life:   Personal Goals: Goals established at orientation with interventions provided to work toward goal. Personal Goals and Risk Factors at Admission - 07/06/18 1415      Core Components/Risk Factors/Patient Goals on Admission    Weight Management  Yes;Weight Maintenance    Admit Weight  142 lb 13.7 oz  (64.8 kg)    Goal Weight: Long Term  140 lb (63.5 kg)    Expected Outcomes  Understanding of distribution of calorie intake throughout the day with the consumption of 4-5 meals/snacks;Understanding recommendations for meals to include 15-35% energy as protein, 25-35% energy from fat, 35-60% energy from carbohydrates, less than 281m of dietary cholesterol, 20-35 gm of total fiber daily;Weight Maintenance: Understanding of the daily nutrition guidelines, which includes 25-35% calories from fat, 7% or less cal from saturated fats, less than 2025mcholesterol, less than 1.5gm of sodium, & 5 or more servings of fruits and vegetables daily;Short Term: Continue to assess and modify interventions until short term weight is achieved;Long Term: Adherence to nutrition and physical activity/exercise program aimed toward attainment of established weight goal    Improve shortness of breath with ADL's  Yes    Intervention  Provide education, individualized exercise plan and daily activity instruction to help decrease symptoms of SOB with activities of daily living.    Expected Outcomes  Short Term: Improve cardiorespiratory fitness to achieve a reduction of symptoms when performing ADLs;Long Term: Be able to perform more ADLs without symptoms or delay the onset of symptoms        Personal Goals Discharge: Goals and Risk Factor Review    Row Name 08/05/18 1210 09/02/18 1753 09/30/18 1124         Core Components/Risk Factors/Patient Goals Review   Personal Goals Review  Weight Management/Obesity;Develop more efficient breathing techniques such as purse lipped breathing and diaphragmatic breathing and practicing self-pacing with activity.;Increase knowledge of respiratory medications and ability to use respiratory devices properly.;Improve shortness of breath with ADL's  Weight Management/Obesity;Develop more efficient breathing techniques such as purse lipped breathing and diaphragmatic breathing and practicing  self-pacing with activity.;Increase knowledge of respiratory medications and ability to use respiratory devices properly.;Improve shortness of breath with ADL's  Weight Management/Obesity;Develop more efficient breathing techniques such as purse lipped breathing and diaphragmatic breathing and practicing self-pacing with activity.;Increase knowledge of respiratory medications and ability to use respiratory devices properly.;Improve shortness of breath with ADL's     Review  Pt who is well known by rehab staff for pulmonary and cardiac rehab, is off to a great start.  Pt has completed 6 exercise sessions.  Pt has attended education on respiratory medications. Pt consistently reporrts level 2 for Dyspnea. Pt demonstrates appropriate technique for PLB and  self pacing.  Pt exercise  wtih level 2.0 on the bike, Treadmill 1.5/0 and nustep level 2.  Anticiapte that pt will show progress in workloads in the next 30 day assessnent.   Pt has completed 14 exercise sessions and 6 education classes.  Pt consistently reporrts level 2 for Dyspnea. Pt demonstrates appropriate technique for PLB and  self pacing.  Pt exercise  wtih level 2.0 on the arm crank Treadmill 1.5/0 and nustep increase to level 3.  Pt used his POC while on the treadmill.  Pt tried 1.6/1.0 but had to decrease back to 1.5/0 due to RPE 17, HR 126 and o2 sat 87 inc to 3l on the POC.   Anticiapte that pt will show progress in workloads in the next 30 day assessnent.   Pt has completed 22 exercise sessions and 7 education classes.  Pt consistently reporrts level 2 for Dyspnea. Pt demonstrates appropriate technique for PLB and  self pacing.  Pt with minor setback due to respiratory infection.  pt having some trouble bouncing back. Pt exercise  wtih level 1.8on the arm crank Treadmill 1.0/0 and nustep remains  level 3.   Pt is nearing graduations.  Continue to monitor progress.      Expected Outcomes  See Admission Goals/Outcomes  See Admission Goals/Outcomes  See  Admission Goals/Outcomes        Exercise Goals and Review: Exercise Goals    Row Name 07/06/18 1406             Exercise Goals   Increase Physical Activity  Yes       Intervention  Provide advice, education, support and counseling about physical activity/exercise needs.;Develop an individualized exercise prescription for aerobic and resistive training based on initial evaluation findings, risk stratification, comorbidities and participant's personal goals.       Expected Outcomes  Short Term: Attend rehab on a regular basis to increase amount of physical activity.;Long Term: Add in home exercise to make exercise part of routine and to increase amount of physical activity.;Long Term: Exercising regularly at least 3-5 days a week.       Increase Strength and Stamina  Yes       Intervention  Provide advice, education, support and counseling about physical activity/exercise needs.;Develop an individualized exercise prescription for aerobic and resistive training based on initial evaluation findings, risk stratification, comorbidities and participant's personal goals.       Expected Outcomes  Short Term: Increase workloads from initial exercise prescription for resistance, speed, and METs.;Short Term: Perform resistance training exercises routinely during rehab and add in resistance training at home;Long Term: Improve cardiorespiratory fitness, muscular endurance and strength as measured by increased METs and functional capacity (6MWT)       Able to understand and use rate of perceived exertion (RPE) scale  Yes       Intervention  Provide education and explanation on how to use RPE scale       Expected Outcomes  Short Term: Able to use RPE daily in rehab to express subjective intensity level;Long Term:  Able to use RPE to guide intensity level when exercising independently       Able to understand and use Dyspnea scale  Yes       Intervention  Provide education and explanation on how to use Dyspnea  scale       Expected Outcomes  Short Term: Able to use Dyspnea scale daily in rehab to express subjective sense of shortness of breath during exertion;Long Term: Able to use Dyspnea scale to guide intensity level when exercising independently       Knowledge and understanding of Target Heart Rate Range (THRR)  Yes       Intervention  Provide education and explanation of THRR including how the numbers were predicted and where they are located for reference       Expected Outcomes  Short Term: Able to state/look up THRR;Short Term: Able to use daily as guideline for intensity in rehab;Long Term: Able to use THRR to govern intensity when exercising independently       Understanding of Exercise Prescription  Yes       Intervention  Provide education, explanation, and written materials on patient's individual exercise prescription       Expected Outcomes  Short Term: Able to explain program exercise prescription;Long Term: Able to explain home exercise prescription to exercise independently          Exercise Goals Re-Evaluation: Exercise Goals Re-Evaluation    Row Name 08/03/18 1625 09/01/18 1605 09/30/18 0745         Exercise Goal Re-Evaluation   Exercise Goals Review  Increase Physical Activity;Increase Strength and Stamina;Able to understand and use rate of perceived exertion (RPE) scale;Able to understand and use Dyspnea scale;Knowledge and understanding of Target Heart Rate Range (THRR);Understanding of Exercise Prescription  Increase Physical Activity;Increase Strength and Stamina;Able to understand and use rate of perceived exertion (RPE) scale;Able to understand and use Dyspnea scale;Knowledge and understanding of Target Heart Rate Range (THRR);Understanding of Exercise Prescription  Increase Physical Activity;Increase Strength and Stamina;Able to understand and use rate of perceived exertion (RPE) scale;Able to understand and use Dyspnea scale;Knowledge and understanding of Target Heart Rate  Range (THRR);Understanding of Exercise Prescription     Comments  Patient is making slow progress. Can be gaurded at times with how hard he pushes himself. MET average places him in a lower level. Is motivated to make changes. Home exercise was discussed.. Patient wants to get back to walking 45 minutes at a time. Will cont. to monitor and motivate as able.   Patient is making slow progress. Can be gaurded at times with how hard he pushes himself. MET average places him in a lower level. Is motivated to make changes. Home exercise was discussed.. Patient wants to get back to walking 45 minutes at a time. Will cont. to monitor and motivate as able.   Pt has made slow progress. He is gaurded with how hard that he pushes himself and exercises at a low level of METs (~1.7). Pt will graduate next week and is motivated to continue exercise, as he reports that he will join the [facility]. Will continue to monitor and motivate as able.      Expected Outcomes  Through exercise at rehab and at home, the patient will decrease shortness of breath with daily activities  and feel confident in carrying out an exercise regime at home.   Through exercise at rehab and at home, the patient will decrease shortness of breath with daily activities and feel confident in carrying out an exercise regime at home.   Through exercise at rehab and at home, the patient will decrease shortness of breath with daily activities and feel confident in carrying out an exercise regime at home.         Nutrition & Weight - Outcomes:    Nutrition: Nutrition Therapy & Goals - 10/16/18 1650      Nutrition Therapy   Diet  general healthful      Personal Nutrition Goals   Nutrition Goal  identify and limit dietary sources of sodium    Personal Goal #2  Identify food quantities necessary to achieve wt maintenance at graduation from pulmonary rehab      Intervention Plan   Intervention  Prescribe, educate and counsel regarding individualized  specific dietary modifications aiming towards targeted core components such as weight, hypertension, lipid management, diabetes, heart failure and other comorbidities.    Expected Outcomes  Short Term Goal: Understand basic principles of dietary content, such as calories, fat, sodium, cholesterol and nutrients.       Nutrition Discharge: Nutrition Assessments - 10/08/18 1400      Rate Your Plate Scores   Pre Score  51    Post Score  39       Education Questionnaire Score: Knowledge Questionnaire Score - 10/01/18 1447      Knowledge Questionnaire Score   Post Score  16/18       Goals reviewed with patient. Cherre Huger, BSN Cardiac and Training and development officer

## 2018-11-23 ENCOUNTER — Other Ambulatory Visit: Payer: Self-pay | Admitting: Physician Assistant

## 2018-11-23 DIAGNOSIS — C44329 Squamous cell carcinoma of skin of other parts of face: Secondary | ICD-10-CM | POA: Diagnosis not present

## 2018-11-23 DIAGNOSIS — D485 Neoplasm of uncertain behavior of skin: Secondary | ICD-10-CM | POA: Diagnosis not present

## 2018-11-23 DIAGNOSIS — L821 Other seborrheic keratosis: Secondary | ICD-10-CM | POA: Diagnosis not present

## 2018-11-23 DIAGNOSIS — Z85820 Personal history of malignant melanoma of skin: Secondary | ICD-10-CM | POA: Diagnosis not present

## 2018-11-23 DIAGNOSIS — D1801 Hemangioma of skin and subcutaneous tissue: Secondary | ICD-10-CM | POA: Diagnosis not present

## 2018-11-30 ENCOUNTER — Ambulatory Visit: Payer: Medicare Other | Admitting: Pulmonary Disease

## 2018-12-02 DIAGNOSIS — C44329 Squamous cell carcinoma of skin of other parts of face: Secondary | ICD-10-CM | POA: Diagnosis not present

## 2018-12-03 ENCOUNTER — Encounter: Payer: Self-pay | Admitting: Pulmonary Disease

## 2018-12-03 ENCOUNTER — Ambulatory Visit (INDEPENDENT_AMBULATORY_CARE_PROVIDER_SITE_OTHER): Payer: Medicare Other | Admitting: Pulmonary Disease

## 2018-12-03 VITALS — BP 122/58 | HR 78 | Ht 67.0 in | Wt 135.2 lb

## 2018-12-03 DIAGNOSIS — R0602 Shortness of breath: Secondary | ICD-10-CM | POA: Diagnosis not present

## 2018-12-03 DIAGNOSIS — D899 Disorder involving the immune mechanism, unspecified: Secondary | ICD-10-CM

## 2018-12-03 DIAGNOSIS — D849 Immunodeficiency, unspecified: Secondary | ICD-10-CM | POA: Insufficient documentation

## 2018-12-03 DIAGNOSIS — J449 Chronic obstructive pulmonary disease, unspecified: Secondary | ICD-10-CM | POA: Diagnosis not present

## 2018-12-03 DIAGNOSIS — J9611 Chronic respiratory failure with hypoxia: Secondary | ICD-10-CM

## 2018-12-03 DIAGNOSIS — Z9989 Dependence on other enabling machines and devices: Secondary | ICD-10-CM

## 2018-12-03 DIAGNOSIS — Z7952 Long term (current) use of systemic steroids: Secondary | ICD-10-CM | POA: Diagnosis not present

## 2018-12-03 DIAGNOSIS — G4733 Obstructive sleep apnea (adult) (pediatric): Secondary | ICD-10-CM | POA: Diagnosis not present

## 2018-12-03 DIAGNOSIS — J432 Centrilobular emphysema: Secondary | ICD-10-CM

## 2018-12-03 MED ORDER — BUDESONIDE-FORMOTEROL FUMARATE 160-4.5 MCG/ACT IN AERO: 2.0000 | INHALATION_SPRAY | Freq: Two times a day (BID) | RESPIRATORY_TRACT | 0 refills | Status: DC

## 2018-12-03 MED ORDER — PREDNISONE 1 MG PO TABS: ORAL_TABLET | ORAL | 0 refills | Status: DC

## 2018-12-03 MED ORDER — TIOTROPIUM BROMIDE MONOHYDRATE 2.5 MCG/ACT IN AERS: 2.0000 | INHALATION_SPRAY | Freq: Every day | RESPIRATORY_TRACT | 0 refills | Status: DC

## 2018-12-03 MED ORDER — AEROCHAMBER MV MISC: 0 refills | Status: AC

## 2018-12-03 MED ORDER — BUDESONIDE 0.5 MG/2ML IN SUSP: 0.5000 mg | Freq: Two times a day (BID) | RESPIRATORY_TRACT | 0 refills | Status: DC

## 2018-12-03 NOTE — Progress Notes (Signed)
Patient seen in the office today and instructed on use of Symbicort and Spiriva.  Patient expressed understanding and demonstrated technique.  

## 2018-12-03 NOTE — Progress Notes (Signed)
Synopsis: Referred in Jan. 2020 to establish care with me, patient of Dr. Lenna Gilford.  Subjective:   PATIENT ID: Andrew [name] GENDER: male DOB: 07/31/1941, MRN: [id number]  Chief Complaint  Patient presents with  . Follow-up    Former [facility] patient. States his breathing has been good. States he has alot of reflux. No new concerns.     PMH COPD, h/o of two belbectomy at [facility] in 2007. Using albuterol as needed and Spiriva and Advair for >10 years. He has not been tried on anything else at the time. He has been on prednisone 25m almost every day for the past 2-3 months.  In the past several years he has been in the hospital at least 6 times he states.  He is currently oxygen dependent overall doing well.  [facility]  He states that he has a newfound reason for living and that is his new girlfriend.  They met at church.  Overall his breathing is about the same.  He feels like he is at least able to complete most of his activities of daily living.  He does have dyspnea with extensive exertion but otherwise okay.  Last FEV1 was completed on office spirometry 37% predicted 2016.   Past Medical History:  Diagnosis Date  . Anemia   . BiPAP (biphasic positive airway pressure) dependence    Pt denies history of OSA  . BPH (benign prostatic hyperplasia)   . Cancer ([facility]    skin  . COPD (chronic obstructive pulmonary disease) ([place]   . Dysphagia   . Emphysema lung ([place]   . GERD (gastroesophageal reflux disease)    " Silent reflux"  . Hearing loss    right ear  . History of hiatal hernia   . HLD (hyperlipidemia)   . Hypertension   . Oxygen dependent    2-3 liters  . Oxygen dependent   . Pneumonia   . Pulmonary hypertension ([place]   . Sleep apnea    wears cpap     Family History  Problem Relation Age of Onset  . Cancer Maternal Uncle   . Pancreatitis Mother   . Bone cancer Brother   . Stroke Maternal Grandfather   . Congestive Heart  Failure Maternal Grandfather      Past Surgical History:  Procedure Laterality Date  . CARDIAC CATHETERIZATION    . CATARACT EXTRACTION W/ INTRAOCULAR LENS  IMPLANT, BILATERAL    . COLONOSCOPY WITH PROPOFOL N/A 04/20/2018   Procedure: COLONOSCOPY WITH PROPOFOL;  Surgeon: SWilford Corner MD;  [facility]  Service: Endoscopy;  Laterality: N/A;  . ESOPHAGOGASTRODUODENOSCOPY (EGD) WITH PROPOFOL N/A 08/23/2016   Procedure: ESOPHAGOGASTRODUODENOSCOPY (EGD) WITH PROPOFOL;  Surgeon: VWilford Corner MD;  [facility];  Service: Endoscopy;  Laterality: N/A;  . ESOPHAGOGASTRODUODENOSCOPY (EGD) WITH PROPOFOL N/A 04/20/2018   Procedure: ESOPHAGOGASTRODUODENOSCOPY (EGD) WITH PROPOFOL;  Surgeon: SWilford Corner MD;  [place] [facility]  Service: Endoscopy;  Laterality: N/A;  . HOT HEMOSTASIS N/A 04/20/2018   Procedure: HOT HEMOSTASIS (ARGON PLASMA COAGULATION/BICAP);  Surgeon: SWilford Corner MD;  [place] [facility]  Service: Endoscopy;  Laterality: N/A;  . LUNG SURGERY    . POLYPECTOMY  04/20/2018   Procedure: POLYPECTOMY;  Surgeon: SWilford Corner MD;  [facility];  Service: Endoscopy;;  . TONSILLECTOMY      Social History   Socioeconomic History  . Marital status: Divorced    Spouse name: Not on file  . Number of children: Not  on file  . Years of education: Not on file  . Highest education level: Not on file  Occupational History  . Not on file  Social Needs  . Financial resource strain: Not on file  . Food insecurity:    Worry: Not on file    Inability: Not on file  . Transportation needs:    Medical: Not on file    Non-medical: Not on file  Tobacco Use  . Smoking status: Former Smoker    Packs/day: 0.00    Years: 0.00    Pack years: 0.00    Types: Cigarettes    Last attempt to quit: 10/01/1984    Years since quitting: 34.1  . Smokeless tobacco: Never Used  Substance and Sexual Activity  . Alcohol use: No    Alcohol/week: 0.0  standard drinks  . Drug use: No  . Sexual activity: Not Currently  Lifestyle  . Physical activity:    Days per week: Not on file    Minutes per session: Not on file  . Stress: Not on file  Relationships  . Social connections:    Talks on phone: Not on file    Gets together: Not on file    Attends religious service: Not on file    Active member of club or organization: Not on file    Attends meetings of clubs or organizations: Not on file    Relationship status: Not on file  . Intimate partner violence:    Fear of current or ex partner: Not on file    Emotionally abused: Not on file    Physically abused: Not on file    Forced sexual activity: Not on file  Other Topics Concern  . Not on file  Social History Narrative  . Not on file     No Known Allergies   Outpatient Medications Prior to Visit  Medication Sig Dispense Refill  . ADVAIR DISKUS 250-50 MCG/DOSE AEPB Inhale 1 puff into the lungs 2 (two) times daily. 60 each 2  . albuterol (PROVENTIL HFA;VENTOLIN HFA) 108 (90 Base) MCG/ACT inhaler Inhale 2 puffs into the lungs every 6 (six) hours as needed for wheezing or shortness of breath. 1 Inhaler 11  . apixaban (ELIQUIS) 5 MG TABS tablet Take 1 tablet (5 mg total) by mouth 2 (two) times daily. 180 tablet 1  . diltiazem (CARDIZEM CD) 180 MG 24 hr capsule TAKE 1 CAPSULE EVERY DAY 90 capsule 2  . finasteride (PROSCAR) 5 MG tablet Take 5 mg by mouth at bedtime.     . fluticasone (FLONASE) 50 MCG/ACT nasal spray Place 1 spray into both nostrils at bedtime.     Marland Kitchen guaiFENesin (MUCINEX) 600 MG 12 hr tablet Take 1 tablet (600 mg total) by mouth 2 (two) times daily. 30 tablet 0  . hydrocortisone cream 1 % Apply 1 application topically daily as needed (Eczema on face and head).    Marland Kitchen ipratropium-albuterol (DUONEB) 0.5-2.5 (3) MG/3ML SOLN Take 3 mLs by nebulization 3 (three) times daily. 360 mL 2  . ipratropium-albuterol (DUONEB) 0.5-2.5 (3) MG/3ML SOLN INHALE 1 VIAL VIA NEBULIZER EVERY  three times a day 540 mL 2  . liver oil-zinc oxide (DESITIN) 40 % ointment Apply 1 application topically daily.    . Multiple Vitamins-Minerals (MULTIVITAMIN WITH MINERALS) tablet Take 1 tablet by mouth daily.    Marland Kitchen omeprazole (PRILOSEC) 40 MG capsule Take 40 mg by mouth 2 (two) times daily.     . OXYGEN Inhale 2-3 L into the  lungs See admin instructions. 2L daytime, 3L at night     . polyethylene glycol (MIRALAX / GLYCOLAX) packet Take 17 g by mouth daily. (Patient taking differently: Take 17 g by mouth at bedtime. Mix in 8 oz liquid and drink) 14 each 1  . predniSONE (DELTASONE) 10 MG tablet Take 1 tablet (10 mg total) by mouth daily with breakfast. 30 tablet 6  . rosuvastatin (CRESTOR) 10 MG tablet Take 10 mg by mouth daily.    . sildenafil (REVATIO) 20 MG tablet Take 1 tablet (20 mg total) by mouth 2 (two) times daily. 180 tablet 3  . SPIRIVA HANDIHALER 18 MCG inhalation capsule INHALE THE CONTENTS OF 1 CAPSULE EVERY DAY 90 capsule 3  . tamsulosin (FLOMAX) 0.4 MG CAPS capsule Take 1 capsule (0.4 mg total) by mouth daily after breakfast. 30 capsule 0  . temazepam (RESTORIL) 30 MG capsule Take 1 capsule (30 mg total) by mouth at bedtime. 30 capsule 5  . vitamin C (ASCORBIC ACID) 500 MG tablet Take 500 mg by mouth 2 (two) times daily.      No facility-administered medications prior to visit.     Review of Systems  Constitutional: Negative for chills, fever, malaise/fatigue and weight loss.  HENT: Negative for hearing loss, sore throat and tinnitus.   Eyes: Negative for blurred vision and double vision.  Respiratory: Positive for sputum production, shortness of breath and wheezing. Negative for cough, hemoptysis and stridor.   Cardiovascular: Negative for chest pain, palpitations, orthopnea, leg swelling and PND.  Gastrointestinal: Negative for abdominal pain, constipation, diarrhea, heartburn, nausea and vomiting.  Genitourinary: Negative for dysuria, hematuria and urgency.  Musculoskeletal:  Negative for joint pain and myalgias.  Skin: Negative for itching and rash.  Neurological: Negative for dizziness, tingling, weakness and headaches.  Endo/Heme/Allergies: Negative for environmental allergies. Does not bruise/bleed easily.  Psychiatric/Behavioral: Negative for depression. The patient is not nervous/anxious and does not have insomnia.   All other systems reviewed and are negative.    Objective:  Physical Exam Vitals signs reviewed.  Constitutional:      General: He is not in acute distress.    Appearance: He is well-developed.  HENT:     Head: Normocephalic and atraumatic.     Mouth/Throat:     Pharynx: No oropharyngeal exudate.  Eyes:     Conjunctiva/sclera: Conjunctivae normal.     Pupils: Pupils are equal, round, and reactive to light.  Neck:     Vascular: No JVD.     Trachea: No tracheal deviation.     Comments: Loss of supraclavicular fat Cardiovascular:     Rate and Rhythm: Normal rate and regular rhythm.     Heart sounds: S1 normal and S2 normal.     Comments: Distant heart tones Pulmonary:     Effort: No tachypnea or accessory muscle usage.     Breath sounds: No stridor. Decreased breath sounds (throughout all lung fields) present. No wheezing, rhonchi or rales.  Abdominal:     General: Bowel sounds are normal. There is no distension.     Palpations: Abdomen is soft.     Tenderness: There is no abdominal tenderness.  Musculoskeletal:        General: Deformity (muscle wasting ) present.  Skin:    General: Skin is warm and dry.     Capillary Refill: Capillary refill takes less than 2 seconds.     Findings: No rash.  Neurological:     Mental Status: He is alert and oriented to  person, place, and time.  Psychiatric:        Behavior: Behavior normal.      Vitals:   12/03/18 1122  BP: (!) 122/58  Pulse: 78  SpO2: 95%  Weight: 135 lb 3.2 oz (61.3 kg)  Height: _0  (1.702 m)   95% on 2 LPM  BMI Readings from Last 3 Encounters:  12/03/18  21.18 kg/m  09/29/18 21.58 kg/m  09/28/18 21.68 kg/m   Wt Readings from Last 3 Encounters:  12/03/18 135 lb 3.2 oz (61.3 kg)  09/29/18 137 lb 12.6 oz (62.5 kg)  09/28/18 138 lb 6.4 oz (62.8 kg)     CBC    Component Value Date/Time   WBC 9.2 09/28/2018 1242   RBC 4.80 09/28/2018 1242   HGB 15.6 09/28/2018 1242   HCT 46.7 09/28/2018 1242   PLT 240.0 09/28/2018 1242   MCV 97.3 09/28/2018 1242   MCH 28.6 04/28/2018 0654   MCHC 33.5 09/28/2018 1242   RDW 14.4 09/28/2018 1242   LYMPHSABS 0.5 (L) 09/28/2018 1242   MONOABS 0.4 09/28/2018 1242   EOSABS 0.0 09/28/2018 1242   BASOSABS 0.0 09/28/2018 1242    Chest Imaging: Patient has had no prior axial CT imaging.  Pulmonary Functions Testing Results: No flowsheet data found.  FeNO: None   Pathology: None   Echocardiogram: None   Heart Catheterization: None     Assessment & Plan:   Stage 3 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 ([place]) - Plan: Pulmonary Function Test  Shortness of breath - Plan: Pulmonary Function Test, CT CHEST WO CONTRAST  Centrilobular emphysema (HCC)  Chronic hypoxemic respiratory failure (HCC)  OSA on CPAP  Immunosuppressed status (HCC)  Current chronic use of systemic steroids  Discussion:  This is a 78 year old gentleman with severe COPD, last FEV1 37% predicted on office spirometry.  No prior PFTs.  Chronic hypoxemic respiratory failure on home oxygen therapy.  Currently maintained on Advair and Spiriva HandiHaler as well as 10 mg daily oral prednisone which she has been on for the past 3 months.  I think we should start his progressive dyspnea shortness of breath evaluation to include the following: We will obtain a noncontrast CT of the chest as well as full pulmonary function test.  As for the management of his severe COPD I believe that it is pertinent that we taper him off of oral prednisone.  We will start with the following: We will recommend stopping Advair and Spiriva  HandiHaler We will start Symbicort 160, 2 puffs twice daily with spacer Start Spiriva 2.5, 2 puffs once daily Continue albuterol for shortness of breath and wheezing Add Pulmicort 500, twice daily Taper oral prednisone dose to 8 mg daily for the next 2 weeks and then decrease to 6 mg daily until seen by [place] in clinic  I have placed orders for pulmonary function test as well as CT of the chest.  If he starts to have exacerbations coming off of prednisone we will consider azithromycin therapy Monday Wednesday Friday.  Patient to follow-up with [place] in clinic in 4 weeks.  Greater than 50% of this patient's 40-minute office was spent face-to-face discussing above recommendations treatment plan.   Current Outpatient Medications:  .  ADVAIR DISKUS 250-50 MCG/DOSE AEPB, Inhale 1 puff into the lungs 2 (two) times daily., Disp: 60 each, Rfl: 2 .  albuterol (PROVENTIL HFA;VENTOLIN HFA) 108 (90 Base) MCG/ACT inhaler, Inhale 2 puffs into the lungs every 6 (six) hours as needed for wheezing or shortness  of breath., Disp: 1 Inhaler, Rfl: 11 .  apixaban (ELIQUIS) 5 MG TABS tablet, Take 1 tablet (5 mg total) by mouth 2 (two) times daily., Disp: 180 tablet, Rfl: 1 .  diltiazem (CARDIZEM CD) 180 MG 24 hr capsule, TAKE 1 CAPSULE EVERY DAY, Disp: 90 capsule, Rfl: 2 .  finasteride (PROSCAR) 5 MG tablet, Take 5 mg by mouth at bedtime. , Disp: , Rfl:  .  fluticasone (FLONASE) 50 MCG/ACT nasal spray, Place 1 spray into both nostrils at bedtime. , Disp: , Rfl:  .  guaiFENesin (MUCINEX) 600 MG 12 hr tablet, Take 1 tablet (600 mg total) by mouth 2 (two) times daily., Disp: 30 tablet, Rfl: 0 .  hydrocortisone cream 1 %, Apply 1 application topically daily as needed (Eczema on face and head)., Disp: , Rfl:  .  ipratropium-albuterol (DUONEB) 0.5-2.5 (3) MG/3ML SOLN, Take 3 mLs by nebulization 3 (three) times daily., Disp: 360 mL, Rfl: 2 .  ipratropium-albuterol (DUONEB) 0.5-2.5 (3) MG/3ML SOLN, INHALE 1 VIAL VIA NEBULIZER  EVERY three times a day, Disp: 540 mL, Rfl: 2 .  liver oil-zinc oxide (DESITIN) 40 % ointment, Apply 1 application topically daily., Disp: , Rfl:  .  Multiple Vitamins-Minerals (MULTIVITAMIN WITH MINERALS) tablet, Take 1 tablet by mouth daily., Disp: , Rfl:  .  omeprazole (PRILOSEC) 40 MG capsule, Take 40 mg by mouth 2 (two) times daily. , Disp: , Rfl:  .  OXYGEN, Inhale 2-3 L into the lungs See admin instructions. 2L daytime, 3L at night , Disp: , Rfl:  .  polyethylene glycol (MIRALAX / GLYCOLAX) packet, Take 17 g by mouth daily. (Patient taking differently: Take 17 g by mouth at bedtime. Mix in 8 oz liquid and drink), Disp: 14 each, Rfl: 1 .  predniSONE (DELTASONE) 10 MG tablet, Take 1 tablet (10 mg total) by mouth daily with breakfast., Disp: 30 tablet, Rfl: 6 .  rosuvastatin (CRESTOR) 10 MG tablet, Take 10 mg by mouth daily., Disp: , Rfl:  .  sildenafil (REVATIO) 20 MG tablet, Take 1 tablet (20 mg total) by mouth 2 (two) times daily., Disp: 180 tablet, Rfl: 3 .  SPIRIVA HANDIHALER 18 MCG inhalation capsule, INHALE THE CONTENTS OF 1 CAPSULE EVERY DAY, Disp: 90 capsule, Rfl: 3 .  tamsulosin (FLOMAX) 0.4 MG CAPS capsule, Take 1 capsule (0.4 mg total) by mouth daily after breakfast., Disp: 30 capsule, Rfl: 0 .  temazepam (RESTORIL) 30 MG capsule, Take 1 capsule (30 mg total) by mouth at bedtime., Disp: 30 capsule, Rfl: 5 .  vitamin C (ASCORBIC ACID) 500 MG tablet, Take 500 mg by mouth 2 (two) times daily. , Disp: , Rfl:    Garner Nash, DO [facility] 12/03/2018 11:38 AM

## 2018-12-03 NOTE — Patient Instructions (Addendum)
Thank you for visiting Dr. Valeta Harms at [facility]. Today we recommend the following:  Please stop Advair and Spiriva.   We are starting you on Symbicort 160,  2 puffs twice daily.  Spiriva Respimat 2.5 2 puffs daily.  Pulmicort Neb twice daily.  Decreased your prednisone to 8mg  daily for 2 weeks then starting  January 31st please start taking 6mg  daily until your next office visit.  Start a Vitamin D supplement. 2000 units daily.    Return in about 4 weeks (around 12/31/2018).

## 2018-12-04 ENCOUNTER — Telehealth: Payer: Self-pay

## 2018-12-04 NOTE — Telephone Encounter (Signed)
LMTCB. Please reschedule appt with a PFT.   Patient needs to be notified Dr. Valeta Harms has ordered a Chest CT and his appointment needs to be resheduled with a PFT.

## 2018-12-07 NOTE — Telephone Encounter (Signed)
Patient returned call and appt was scheduled for PFT and CT appt date/time/location advised.  No call back is necessary.

## 2018-12-08 DIAGNOSIS — J310 Chronic rhinitis: Secondary | ICD-10-CM | POA: Diagnosis not present

## 2018-12-12 ENCOUNTER — Encounter (HOSPITAL_COMMUNITY): Payer: Self-pay | Admitting: *Deleted

## 2018-12-12 ENCOUNTER — Emergency Department (HOSPITAL_COMMUNITY): Payer: Medicare Other

## 2018-12-12 ENCOUNTER — Inpatient Hospital Stay (HOSPITAL_COMMUNITY)
Admission: EM | Admit: 2018-12-12 | Discharge: 2018-12-15 | DRG: 190 | Disposition: A | Discharge status: Home / Self Care | Payer: Medicare Other | Attending: Internal Medicine | Admitting: Internal Medicine

## 2018-12-12 ENCOUNTER — Other Ambulatory Visit: Payer: Self-pay

## 2018-12-12 DIAGNOSIS — I10 Essential (primary) hypertension: Secondary | ICD-10-CM | POA: Diagnosis present

## 2018-12-12 DIAGNOSIS — N400 Enlarged prostate without lower urinary tract symptoms: Secondary | ICD-10-CM | POA: Diagnosis present

## 2018-12-12 DIAGNOSIS — G4733 Obstructive sleep apnea (adult) (pediatric): Secondary | ICD-10-CM | POA: Diagnosis not present

## 2018-12-12 DIAGNOSIS — K449 Diaphragmatic hernia without obstruction or gangrene: Secondary | ICD-10-CM | POA: Diagnosis present

## 2018-12-12 DIAGNOSIS — Z9841 Cataract extraction status, right eye: Secondary | ICD-10-CM

## 2018-12-12 DIAGNOSIS — J441 Chronic obstructive pulmonary disease with (acute) exacerbation: Secondary | ICD-10-CM | POA: Diagnosis not present

## 2018-12-12 DIAGNOSIS — R636 Underweight: Secondary | ICD-10-CM | POA: Diagnosis present

## 2018-12-12 DIAGNOSIS — H9191 Unspecified hearing loss, right ear: Secondary | ICD-10-CM | POA: Diagnosis present

## 2018-12-12 DIAGNOSIS — Z8679 Personal history of other diseases of the circulatory system: Secondary | ICD-10-CM

## 2018-12-12 DIAGNOSIS — E785 Hyperlipidemia, unspecified: Secondary | ICD-10-CM | POA: Diagnosis not present

## 2018-12-12 DIAGNOSIS — J9611 Chronic respiratory failure with hypoxia: Secondary | ICD-10-CM | POA: Diagnosis present

## 2018-12-12 DIAGNOSIS — Z6820 Body mass index (BMI) 20.0-20.9, adult: Secondary | ICD-10-CM

## 2018-12-12 DIAGNOSIS — Z902 Acquired absence of lung [part of]: Secondary | ICD-10-CM

## 2018-12-12 DIAGNOSIS — R1310 Dysphagia, unspecified: Secondary | ICD-10-CM | POA: Diagnosis present

## 2018-12-12 DIAGNOSIS — Z7901 Long term (current) use of anticoagulants: Secondary | ICD-10-CM

## 2018-12-12 DIAGNOSIS — J209 Acute bronchitis, unspecified: Secondary | ICD-10-CM | POA: Diagnosis not present

## 2018-12-12 DIAGNOSIS — G4730 Sleep apnea, unspecified: Secondary | ICD-10-CM | POA: Diagnosis present

## 2018-12-12 DIAGNOSIS — Z79899 Other long term (current) drug therapy: Secondary | ICD-10-CM

## 2018-12-12 DIAGNOSIS — Z87891 Personal history of nicotine dependence: Secondary | ICD-10-CM

## 2018-12-12 DIAGNOSIS — I272 Pulmonary hypertension, unspecified: Secondary | ICD-10-CM | POA: Diagnosis present

## 2018-12-12 DIAGNOSIS — J9621 Acute and chronic respiratory failure with hypoxia: Secondary | ICD-10-CM | POA: Diagnosis present

## 2018-12-12 DIAGNOSIS — Z7951 Long term (current) use of inhaled steroids: Secondary | ICD-10-CM

## 2018-12-12 DIAGNOSIS — Z9842 Cataract extraction status, left eye: Secondary | ICD-10-CM

## 2018-12-12 DIAGNOSIS — Z9981 Dependence on supplemental oxygen: Secondary | ICD-10-CM

## 2018-12-12 DIAGNOSIS — R0602 Shortness of breath: Secondary | ICD-10-CM

## 2018-12-12 DIAGNOSIS — Z961 Presence of intraocular lens: Secondary | ICD-10-CM | POA: Diagnosis present

## 2018-12-12 DIAGNOSIS — K219 Gastro-esophageal reflux disease without esophagitis: Secondary | ICD-10-CM | POA: Diagnosis present

## 2018-12-12 DIAGNOSIS — Z85828 Personal history of other malignant neoplasm of skin: Secondary | ICD-10-CM

## 2018-12-12 DIAGNOSIS — I480 Paroxysmal atrial fibrillation: Secondary | ICD-10-CM | POA: Diagnosis present

## 2018-12-12 DIAGNOSIS — Z7952 Long term (current) use of systemic steroids: Secondary | ICD-10-CM

## 2018-12-12 LAB — CBC WITH DIFFERENTIAL/PLATELET
ABS IMMATURE GRANULOCYTES: 0.02 10*3/uL (ref 0.00–0.07)
BASOS ABS: 0 10*3/uL (ref 0.0–0.1)
Basophils Relative: 0 %
EOS ABS: 0 10*3/uL (ref 0.0–0.5)
Eosinophils Relative: 0 %
HCT: 46.1 % (ref 39.0–52.0)
Hemoglobin: 15.4 g/dL (ref 13.0–17.0)
IMMATURE GRANULOCYTES: 0 %
LYMPHS ABS: 0.3 10*3/uL — AB (ref 0.7–4.0)
Lymphocytes Relative: 4 %
MCH: 32.9 pg (ref 26.0–34.0)
MCHC: 33.4 g/dL (ref 30.0–36.0)
MCV: 98.5 fL (ref 80.0–100.0)
Monocytes Absolute: 0 10*3/uL — ABNORMAL LOW (ref 0.1–1.0)
Monocytes Relative: 0 %
NEUTROS ABS: 7.7 10*3/uL (ref 1.7–7.7)
NEUTROS PCT: 96 %
NRBC: 0 % (ref 0.0–0.2)
PLATELETS: 216 10*3/uL (ref 150–400)
RBC: 4.68 MIL/uL (ref 4.22–5.81)
RDW: 12.6 % (ref 11.5–15.5)
WBC: 8.1 10*3/uL (ref 4.0–10.5)

## 2018-12-12 LAB — COMPREHENSIVE METABOLIC PANEL
ALT: 30 U/L (ref 0–44)
ANION GAP: 13 (ref 5–15)
AST: 21 U/L (ref 15–41)
Albumin: 4.1 g/dL (ref 3.5–5.0)
Alkaline Phosphatase: 63 U/L (ref 38–126)
BUN: 20 mg/dL (ref 8–23)
CHLORIDE: 102 mmol/L (ref 98–111)
CO2: 24 mmol/L (ref 22–32)
Calcium: 9.4 mg/dL (ref 8.9–10.3)
Creatinine, Ser: 1.03 mg/dL (ref 0.61–1.24)
GFR calc non Af Amer: >60 mL/min (ref >60)
Glucose, Bld: 207 mg/dL — ABNORMAL HIGH (ref 70–99)
Potassium: 4.3 mmol/L (ref 3.5–5.1)
SODIUM: 139 mmol/L (ref 135–145)
Total Bilirubin: 0.7 mg/dL (ref 0.3–1.2)
Total Protein: 6.8 g/dL (ref 6.5–8.1)

## 2018-12-12 MED ORDER — ALBUTEROL SULFATE (2.5 MG/3ML) 0.083% IN NEBU: 2.5000 mg | INHALATION_SOLUTION | RESPIRATORY_TRACT | Status: DC | PRN

## 2018-12-12 MED ORDER — ROSUVASTATIN CALCIUM 5 MG PO TABS
10.0000 mg | ORAL_TABLET | Freq: Every day | ORAL | Status: DC
  Administered 2018-12-13 – 2018-12-15 (×3): 10 mg via ORAL
  Filled 2018-12-12 (×4): qty 2

## 2018-12-12 MED ORDER — VITAMIN C 500 MG PO TABS
500.0000 mg | ORAL_TABLET | Freq: Two times a day (BID) | ORAL | Status: DC
  Administered 2018-12-13 – 2018-12-15 (×6): 500 mg via ORAL
  Filled 2018-12-12 (×6): qty 1

## 2018-12-12 MED ORDER — DILTIAZEM HCL ER COATED BEADS 180 MG PO CP24
180.0000 mg | ORAL_CAPSULE | Freq: Every day | ORAL | Status: DC
  Administered 2018-12-13 – 2018-12-15 (×3): 180 mg via ORAL
  Filled 2018-12-12 (×3): qty 1

## 2018-12-12 MED ORDER — FLUTICASONE PROPIONATE 50 MCG/ACT NA SUSP
1.0000 | Freq: Every day | NASAL | Status: DC
  Administered 2018-12-13: 1 via NASAL
  Filled 2018-12-12 (×2): qty 16

## 2018-12-12 MED ORDER — METHYLPREDNISOLONE SODIUM SUCC 125 MG IJ SOLR
80.0000 mg | Freq: Two times a day (BID) | INTRAMUSCULAR | Status: DC
  Administered 2018-12-12 – 2018-12-15 (×6): 80 mg via INTRAVENOUS
  Filled 2018-12-12 (×6): qty 2

## 2018-12-12 MED ORDER — GUAIFENESIN ER 600 MG PO TB12
600.0000 mg | ORAL_TABLET | Freq: Two times a day (BID) | ORAL | Status: DC
  Administered 2018-12-13 – 2018-12-15 (×6): 600 mg via ORAL
  Filled 2018-12-12 (×6): qty 1

## 2018-12-12 MED ORDER — IPRATROPIUM BROMIDE 0.02 % IN SOLN
0.5000 mg | Freq: Once | RESPIRATORY_TRACT | Status: AC
  Administered 2018-12-12: 0.5 mg via RESPIRATORY_TRACT
  Filled 2018-12-12: qty 2.5

## 2018-12-12 MED ORDER — APIXABAN 5 MG PO TABS
5.0000 mg | ORAL_TABLET | Freq: Two times a day (BID) | ORAL | Status: DC
  Administered 2018-12-13 – 2018-12-15 (×6): 5 mg via ORAL
  Filled 2018-12-12 (×6): qty 1

## 2018-12-12 MED ORDER — FINASTERIDE 5 MG PO TABS
5.0000 mg | ORAL_TABLET | Freq: Every day | ORAL | Status: DC
  Administered 2018-12-13 – 2018-12-14 (×3): 5 mg via ORAL
  Filled 2018-12-12 (×3): qty 1

## 2018-12-12 MED ORDER — ALBUTEROL SULFATE (2.5 MG/3ML) 0.083% IN NEBU
2.5000 mg | INHALATION_SOLUTION | Freq: Once | RESPIRATORY_TRACT | Status: AC
  Administered 2018-12-12: 2.5 mg via RESPIRATORY_TRACT
  Filled 2018-12-12: qty 3

## 2018-12-12 MED ORDER — TEMAZEPAM 7.5 MG PO CAPS
30.0000 mg | ORAL_CAPSULE | Freq: Every day | ORAL | Status: DC
  Administered 2018-12-13 – 2018-12-14 (×3): 30 mg via ORAL
  Filled 2018-12-12: qty 2
  Filled 2018-12-12 (×2): qty 4

## 2018-12-12 MED ORDER — SODIUM CHLORIDE 0.9% FLUSH: 3.0000 mL | INTRAVENOUS | Status: DC | PRN

## 2018-12-12 MED ORDER — SODIUM CHLORIDE 0.9 % IV SOLN: 250.0000 mL | INTRAVENOUS | Status: DC | PRN

## 2018-12-12 MED ORDER — TAMSULOSIN HCL 0.4 MG PO CAPS
0.4000 mg | ORAL_CAPSULE | Freq: Every day | ORAL | Status: DC
  Administered 2018-12-13 – 2018-12-15 (×3): 0.4 mg via ORAL
  Filled 2018-12-12 (×3): qty 1

## 2018-12-12 MED ORDER — SILDENAFIL CITRATE 20 MG PO TABS
20.0000 mg | ORAL_TABLET | Freq: Two times a day (BID) | ORAL | Status: DC
  Administered 2018-12-13 – 2018-12-15 (×6): 20 mg via ORAL
  Filled 2018-12-12 (×6): qty 1

## 2018-12-12 MED ORDER — ADULT MULTIVITAMIN W/MINERALS CH
1.0000 | ORAL_TABLET | Freq: Every day | ORAL | Status: DC
  Administered 2018-12-13 – 2018-12-15 (×3): 1 via ORAL
  Filled 2018-12-12 (×3): qty 1

## 2018-12-12 MED ORDER — SODIUM CHLORIDE 0.9% FLUSH
3.0000 mL | Freq: Two times a day (BID) | INTRAVENOUS | Status: DC
  Administered 2018-12-12 – 2018-12-15 (×5): 3 mL via INTRAVENOUS

## 2018-12-12 MED ORDER — IPRATROPIUM-ALBUTEROL 0.5-2.5 (3) MG/3ML IN SOLN
3.0000 mL | Freq: Four times a day (QID) | RESPIRATORY_TRACT | Status: DC
  Administered 2018-12-13 – 2018-12-15 (×9): 3 mL via RESPIRATORY_TRACT
  Filled 2018-12-12 (×9): qty 3

## 2018-12-12 NOTE — ED Provider Notes (Signed)
[facility]ERGENCY DEPARTMENT Provider Note   CSN: [id number] Arrival date & time: 12/12/18  1815     History   Chief Complaint Chief Complaint  Patient presents with  . Shortness of Breath    HPI Andrew [name] is a 78 y.o. male.  HPI   Andrew [name] is a 78 y.o. 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  He reports he had some mild dyspnea yesterday but it worsened this morning.  He went to urgent care at noon today and and a chest x-ray reportedly showed right-sided pneumonia.  He was given an IM injection of an antibiotic in his right buttock and unknown steroid in his left buttock.  States he received 1 DuoNeb there and used 1 DuoNeb at home this morning.  He felt slightly better thereafter and was prescribed doxycycline which he picked up and took 1 dose of this afternoon.  He states he was told to go to the emergency department if he did not improve or felt worse.  He states he did not improve much so he came to the ED for further evaluation.  He has had mild cough which is at his baseline which is nonproductive.  No chest pain.  He does report dyspnea that is significantly worse than his baseline and he does not feel that he could go home and sleep tonight like this.  Past Medical History:  Diagnosis Date  . Anemia   . BiPAP (biphasic positive airway pressure) dependence    Pt denies history of OSA  . BPH (benign prostatic hyperplasia)   . Cancer ([place])    skin  . COPD (chronic obstructive pulmonary disease) ([facility])   . Dysphagia   . Emphysema lung ([place])   . GERD (gastroesophageal reflux disease)    " Silent reflux"  .  Hearing loss    right ear  . History of hiatal hernia   . HLD (hyperlipidemia)   . Hypertension   . Oxygen dependent    2-3 liters  . Oxygen dependent   . Pneumonia   . Pulmonary hypertension ([place])   . Sleep apnea    wears cpap    Patient Active Problem List   Diagnosis Date Noted  . COPD exacerbation ([place]) 12/12/2018  . Immunosuppressed status ([place]) 12/03/2018  . Current chronic use of systemic steroids 12/03/2018  . Iron deficiency anemia, unspecified 04/20/2018  . PVCs (premature ventricular contractions) 02/02/2018  . Pressure injury of skin 01/18/2018  . Influenza-like illness 01/17/2018  . History of pneumonia 12/16/2017  . OSA on CPAP 09/22/2017  . Paroxysmal atrial fibrillation ([facility])   . Malnutrition of moderate degree 09/10/2017  . Protein-calorie malnutrition, severe 12/09/2016  . Dysphagia 08/23/2016  . Normocytic anemia 06/28/2016  . Pressure sore on buttocks 06/28/2016  . Pulmonary hypertension ([place]) 02/20/2016  . GERD (gastroesophageal reflux disease) 02/20/2016  . COPD with acute exacerbation ([place]) 02/13/2016  . Acute respiratory failure with hypoxia ([place]) 02/12/2016  . Stage 3 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 ([place]) 02/11/2016  . Hypertension 02/11/2016  . Hyperlipidemia 02/11/2016  . CAP (community acquired pneumonia)   . BPH (benign prostatic hyperplasia) 12/29/2015  .  COPD with emphysema ([place]) 10/02/2015  . Chronic hypoxemic respiratory failure ([place]) 10/02/2015  . History of pulmonary hypertension 10/02/2015    Past Surgical History:  Procedure Laterality Date  . CARDIAC CATHETERIZATION    . CATARACT EXTRACTION W/ INTRAOCULAR LENS  IMPLANT, BILATERAL    . COLONOSCOPY WITH PROPOFOL N/A 04/20/2018   Procedure: COLONOSCOPY WITH PROPOFOL;  Surgeon: Wilford Corner, MD;  Location: [facility];  Service: Endoscopy;  Laterality: N/A;  . ESOPHAGOGASTRODUODENOSCOPY (EGD) WITH PROPOFOL N/A 08/23/2016   Procedure: ESOPHAGOGASTRODUODENOSCOPY (EGD) WITH  PROPOFOL;  Surgeon: Wilford Corner, MD;  Location: [facility] ENDOSCOPY;  Service: Endoscopy;  Laterality: N/A;  . ESOPHAGOGASTRODUODENOSCOPY (EGD) WITH PROPOFOL N/A 04/20/2018   Procedure: ESOPHAGOGASTRODUODENOSCOPY (EGD) WITH PROPOFOL;  Surgeon: Wilford Corner, MD;  Location: [facility];  Service: Endoscopy;  Laterality: N/A;  . HOT HEMOSTASIS N/A 04/20/2018   Procedure: HOT HEMOSTASIS (ARGON PLASMA COAGULATION/BICAP);  Surgeon: Wilford Corner, MD;  Location: [facility];  Service: Endoscopy;  Laterality: N/A;  . LUNG SURGERY    . POLYPECTOMY  04/20/2018   Procedure: POLYPECTOMY;  Surgeon: Wilford Corner, MD;  [facility] END[facility];  Service: Endoscopy;;  . TONSILLECTOMY          Home Medications    Prior to Admission medications   Medication Sig Start Date End Date Taking? Authorizing Provider  ADVAIR DISKUS 250-50 MCG/DOSE AEPB Inhale 1 puff into the lungs 2 (two) times daily. 11/01/15  Yes Noralee Space, MD  albuterol (PROVENTIL HFA;VENTOLIN HFA) 108 (90 Base) MCG/ACT inhaler Inhale 2 puffs into the lungs every 6 (six) hours as needed for wheezing or shortness of breath. 04/17/17  Yes Noralee Space, MD  apixaban (ELIQUIS) 5 MG TABS tablet Take 1 tablet (5 mg total) by mouth 2 (two) times daily. 06/25/18  Yes Skeet Latch, MD  diltiazem (CARDIZEM CD) 180 MG 24 hr capsule TAKE 1 CAPSULE EVERY DAY Patient taking differently: Take 180 mg by mouth daily.  11/25/18  Yes Almyra Deforest, PA  finasteride (PROSCAR) 5 MG tablet Take 5 mg by mouth at bedtime.    Yes [provider]  fluticasone (FLONASE) 50 MCG/ACT nasal spray Place 1 spray into both nostrils at bedtime.  06/24/17  Yes [provider]  guaiFENesin (MUCINEX) 600 MG 12 hr tablet Take 1 tablet (600 mg total) by mouth 2 (two) times daily. 09/12/17  Yes Lavina Hamman, MD  hydrocortisone cream 1 % Apply 1 application topically daily as needed (Eczema on face and head).   Yes [provider]    ipratropium-albuterol (DUONEB) 0.5-2.5 (3) MG/3ML SOLN Take 3 mLs by nebulization 3 (three) times daily. 11/04/17  Yes Noralee Space, MD  IRON PO Take 1 tablet by mouth 3 (three) times a week.   Yes [provider]  liver oil-zinc oxide (DESITIN) 40 % ointment Apply 1 application topically daily.   Yes [provider]  Multiple Vitamins-Minerals (MULTIVITAMIN WITH MINERALS) tablet Take 1 tablet by mouth daily.   Yes [provider]  omeprazole (PRILOSEC) 40 MG capsule Take 40 mg by mouth 2 (two) times daily.    Yes [provider]  OXYGEN Inhale 2-3 L into the lungs See admin instructions. 2L daytime, 3L at night    Yes [provider]  polyethylene glycol (MIRALAX / GLYCOLAX) packet Take 17 g by mouth daily. Patient taking differently: Take 17 g by mouth at bedtime. Mix in 8 oz liquid and drink 05/08/16  Yes Mesner, Corene Cornea, MD  predniSONE (DELTASONE) 1 MG tablet Take  8mg  for 2 weeks then decrease to 6mg  until your next office visit. Patient taking differently: Take 8 mg by mouth daily.  12/03/18  Yes Icard, Bradley L, DO  rosuvastatin (CRESTOR) 10 MG tablet Take 10 mg by mouth daily.   Yes [provider]  sildenafil (REVATIO) 20 MG tablet Take 1 tablet (20 mg total) by mouth 2 (two) times daily. 10/23/16  Yes Noralee Space, MD  SPIRIVA HANDIHALER 18 MCG inhalation capsule INHALE THE CONTENTS OF 1 CAPSULE EVERY DAY Patient taking differently: Place 18 mcg into inhaler and inhale daily.  06/03/17  Yes Noralee Space, MD  tamsulosin (FLOMAX) 0.4 MG CAPS capsule Take 1 capsule (0.4 mg total) by mouth daily after breakfast. 01/03/16  Yes Samtani, Jai-Gurmukh, MD  temazepam (RESTORIL) 30 MG capsule Take 1 capsule (30 mg total) by mouth at bedtime. 07/09/18  Yes Noralee Space, MD  vitamin C (ASCORBIC ACID) 500 MG tablet Take 500 mg by mouth 2 (two) times daily.    Yes [provider]  VITAMIN D PO Take 1 tablet by mouth daily.   Yes [provider]  budesonide (PULMICORT) 0.5 MG/2ML nebulizer solution Take 2 mLs (0.5 mg total) by nebulization 2 (two) times daily. Patient not taking: Reported on 12/12/2018 12/03/18   June Leap L, DO  budesonide-formoterol (SYMBICORT) 160-4.5 MCG/ACT inhaler Inhale 2 puffs into the lungs 2 (two) times daily. Patient not taking: Reported on 12/12/2018 12/03/18   June Leap L, DO  budesonide-formoterol (SYMBICORT) 160-4.5 MCG/ACT inhaler Inhale 2 puffs into the lungs every 12 (twelve) hours. Patient not taking: Reported on 12/12/2018 12/03/18   Icard, Leory Plowman L, DO  ipratropium-albuterol (DUONEB) 0.5-2.5 (3) MG/3ML SOLN INHALE 1 VIAL VIA NEBULIZER EVERY three times a day Patient not taking: Reported on 12/12/2018 08/31/18   Noralee Space, MD  predniSONE (DELTASONE) 10 MG tablet Take 1 tablet (10 mg total) by mouth daily with breakfast. Patient not taking: Reported on 12/12/2018 09/28/18   Noralee Space, MD  Spacer/Aero-Holding Chambers (AEROCHAMBER MV) inhaler Use as instructed 12/03/18   Icard, Octavio Graves, DO  Tiotropium Bromide Monohydrate (SPIRIVA RESPIMAT) 2.5 MCG/ACT AERS Inhale 2 puffs into the lungs daily. Patient not taking: Reported on 12/12/2018 12/03/18   June Leap L, DO  Tiotropium Bromide Monohydrate (SPIRIVA RESPIMAT) 2.5 MCG/ACT AERS Inhale 2 puffs into the lungs daily. Patient not taking: Reported on 12/12/2018 12/03/18   Garner Nash, DO    Family History Family History  Problem Relation Age of Onset  . Cancer Maternal Uncle   . Pancreatitis Mother   . Bone cancer Brother   . Stroke Maternal Grandfather   . Congestive Heart Failure Maternal Grandfather     Social History Social History   Tobacco Use  . Smoking status: Former Smoker    Packs/day: 0.00    Years: 0.00    Pack years: 0.00    Types: Cigarettes    Last attempt to quit: 10/01/1984    Years since quitting: 34.2  . Smokeless tobacco: Never Used  Substance Use Topics  . Alcohol use: No     Alcohol/week: 0.0 standard drinks  . Drug use: No     Allergies   Patient has no known allergies.   Review of Systems Review of Systems  Constitutional: Negative for chills and fever.  HENT: Negative for ear pain and sore throat.   Eyes: Negative for pain and visual disturbance.  Respiratory: Positive for cough, shortness of breath and wheezing.  Cardiovascular: Negative for chest pain and palpitations.  Gastrointestinal: Negative for abdominal pain and vomiting.  Genitourinary: Negative for dysuria and hematuria.  Musculoskeletal: Negative for arthralgias and back pain.  Skin: Negative for color change and rash.  Neurological: Negative for seizures and syncope.  All other systems reviewed and are negative.    Physical Exam Updated Vital Signs BP (!) 148/75 (BP Location: Right Arm)   Pulse 94   Temp 98.2 F (36.8 C)   Resp 17   Ht 5\' 7"  (1.702 m)   Wt 60.3 kg   SpO2 98%   BMI 20.83 kg/m   Physical Exam Vitals signs and nursing note reviewed.  Constitutional:      Appearance: He is well-developed and underweight. He is ill-appearing.  HENT:     Head: Normocephalic and atraumatic.  Eyes:     Conjunctiva/sclera: Conjunctivae normal.  Neck:     Musculoskeletal: Neck supple.  Cardiovascular:     Rate and Rhythm: Normal rate and regular rhythm.     Heart sounds: S1 normal and S2 normal. No murmur.  Pulmonary:     Effort: Pulmonary effort is normal. Tachypnea present. No respiratory distress.     Breath sounds: Decreased breath sounds (throughout) present.  Abdominal:     General: Abdomen is flat.     Palpations: Abdomen is soft.     Tenderness: There is no abdominal tenderness. Negative signs include Murphy's sign, Rovsing's sign and McBurney's sign.  Skin:    General: Skin is warm and dry.  Neurological:     General: No focal deficit present.     Mental Status: He is alert and oriented to person, place, and time.     Cranial Nerves: Cranial nerves are  intact.  Psychiatric:        Behavior: Behavior is cooperative.      ED Treatments / Results  Labs (all labs ordered are listed, but only abnormal results are displayed) Labs Reviewed  CBC WITH DIFFERENTIAL/PLATELET - Abnormal; Notable for the following components:      Result Value   Lymphs Abs 0.3 (*)    Monocytes Absolute 0.0 (*)    All other components within normal limits  COMPREHENSIVE METABOLIC PANEL - Abnormal; Notable for the following components:   Glucose, Bld 207 (*)    All other components within normal limits  BASIC METABOLIC PANEL  CBC WITH DIFFERENTIAL/PLATELET    EKG EKG Interpretation  Date/Time:  Saturday December 12 2018 18:26:25 EST Ventricular Rate:  102 PR Interval:    QRS Duration: 105 QT Interval:  351 QTC Calculation: 458 R Axis:   39 Text Interpretation:  Sinus tachycardia Borderline repolarization abnormality Nonspecific ST abnormality No significant change since last tracing Confirmed by Lajean Saver [phone]) on 12/12/2018 6:29:45 PM   Radiology Dg Chest Port 1 View  Result Date: 12/12/2018 CLINICAL DATA:  Shortness of breath. History of lung cancer and COPD. EXAM: PORTABLE CHEST 1 VIEW COMPARISON:  09/28/2018 FINDINGS: Cardiomediastinal silhouette is normal. Mediastinal contours appear intact. Tortuosity of the aorta. There is no evidence of focal airspace consolidation, pleural effusion or pneumothorax. Postsurgical changes in the right mid thorax. Severe upper lobe predominant emphysema. Osseous structures are without acute abnormality. Soft tissues are grossly normal. IMPRESSION: Severe upper lobe predominant emphysema. No evidence of acute lobar consolidation. Stable postsurgical changes in the right hemithorax. Electronically Signed   By: Fidela Salisbury M.D.   On: 12/12/2018 19:32    Procedures Procedures (including critical care time)  Medications Ordered  in ED Medications  rosuvastatin (CRESTOR) tablet 10 mg (has no administration  in time range)  finasteride (PROSCAR) tablet 5 mg (5 mg Oral Given 12/13/18 0005)  multivitamin with minerals tablet 1 tablet (has no administration in time range)  tamsulosin (FLOMAX) capsule 0.4 mg (has no administration in time range)  vitamin C (ASCORBIC ACID) tablet 500 mg (500 mg Oral Given 12/13/18 0008)  sildenafil (REVATIO) tablet 20 mg (20 mg Oral Given 12/13/18 0006)  fluticasone (FLONASE) 50 MCG/ACT nasal spray 1 spray (has no administration in time range)  guaiFENesin (MUCINEX) 12 hr tablet 600 mg (600 mg Oral Given 12/13/18 0005)  apixaban (ELIQUIS) tablet 5 mg (5 mg Oral Given 12/13/18 0005)  temazepam (RESTORIL) capsule 30 mg (30 mg Oral Given 12/13/18 0005)  diltiazem (CARDIZEM CD) 24 hr capsule 180 mg (has no administration in time range)  sodium chloride flush (NS) 0.9 % injection 3 mL (3 mLs Intravenous Given 12/12/18 2354)  sodium chloride flush (NS) 0.9 % injection 3 mL (has no administration in time range)  0.9 %  sodium chloride infusion (has no administration in time range)  albuterol (PROVENTIL) (2.5 MG/3ML) 0.083% nebulizer solution 2.5 mg (has no administration in time range)  ipratropium-albuterol (DUONEB) 0.5-2.5 (3) MG/3ML nebulizer solution 3 mL (has no administration in time range)  methylPREDNISolone sodium succinate (SOLU-MEDROL) 125 mg/2 mL injection 80 mg (80 mg Intravenous Given 12/12/18 2354)  albuterol (PROVENTIL) (2.5 MG/3ML) 0.083% nebulizer solution 2.5 mg (2.5 mg Nebulization Given 12/12/18 1854)  ipratropium (ATROVENT) nebulizer solution 0.5 mg (0.5 mg Nebulization Given 12/12/18 1854)     Initial Impression / Assessment and Plan / ED Course  I have reviewed the triage vital signs and the nursing notes.  Pertinent labs & imaging results that were available during my care of the patient were reviewed by me and considered in my medical decision making (see chart for details).     MDM:  Imaging: Chest x-ray shows severe upper lobe predominant emphysema.   No evidence for acute lobar consolidation.  Stable postsurgical changes in the right hemithorax.  ED Provider Interpretation of EKG: Sinus rhythm with a rate of 102 bpm, normal axis, no ST segment elevation or depression, pathologic T wave changes or significant interval irregularity (QTC mildly prolonged at 458.  Wandering baseline in the inferior leads.  Labs: CMP unremarkable, CBC normal  On initial evaluation, patient appears chronically ill. Afebrile and hemodynamically stable although mildly tachypneic. Alert and oriented x4, pleasant, and cooperative.  Presents with cough and dyspnea reported diagnosis of pneumonia earlier today.    On exam, patient has decreased breath sounds bilaterally without focal abnormality.  No rales or rhonchi.  No significant wheezing.  Satting mid 90s on his home 2 L nasal cannula.  He is able to converse but does appear dyspneic.  Treated with what appears to be likely Rocephin and doxycycline as well as a single dose of steroid prior to arrival for pneumonia.  Repeat chest x-ray here showing postsurgical changes and known emphysematous changes without acute pathology.  Although patient was diagnosed with pneumonia earlier today he has had no fever or chills and no constitutional signs or symptoms otherwise.  No unilateral lung findings on exam and chest x-ray as above.  No leukocytosis.  Overall low suspicion for pneumonia.  Presentation most consistent with COPD exacerbation.  Given DuoNeb in the ED with improvement.  However, on initial evaluation he does not feel that he could go home and manage this although he does  do duo nebs 2-3 times daily at home.  Reports compliance with all of his other medications.  He was able to pick up his doxycycline and took one dose today.  No history of CHF and does not appear volume overloaded last echo October 2018 showing LVEF of 55 to 03% with diastolic dysfunction not evaluated at that time.  EKG shows no evidence for acute  ischemia or arrhythmia.  No evidence for DVT on exam.  Low suspicion for PE at this time.  No evidence of pericarditis or myocarditis.  Patient felt better after duoneb but was hesitant to be DC home as he was concerned with risk of worsening dyspnea on exertion. He is scheduled for CT chest and PFTs on 1/27. He ambulated in the hall on his home 2L [place] with significant worsening of his dyspnea.  After sitting back down in his room on [place] nasal cannula he was no longer able to converse and felt much more dyspneic.  Satting 91% at that time on his home oxygen.  States this is much worse than he normally is at home.  He was transiently bumped her Foley was nasal cannula with plan to wean back down to 2 L and improved clinically.  However, would benefit from observation stay with scheduled nebs and possibly re-dosing of steroids.  He was admitted to the hospital service in stable condition.  The plan for this patient was discussed with Dr. Ashok Cordia who voiced agreement and who oversaw evaluation and treatment of this patient.   The patient was fully informed and involved with the history taking, evaluation, workup including labs/images, and plan. The patient's concerns and questions were addressed to the patient's satisfaction and he expressed agreement with the plan to admit.    Final Clinical Impressions(s) / ED Diagnoses   Final diagnoses:  COPD exacerbation [facility])    ED Discharge Orders    None       Payslie Mccaig, Rodena Goldmann, MD 12/13/18 Alena Bills    Lajean Saver, MD 12/15/18 1153

## 2018-12-12 NOTE — ED Notes (Signed)
Pt. Ambulated down hallway without assistance. Started walking down hallway "felt normal" and ambulated at a quick pace.  Once we got to turn around and on the sway back to the room, started to get SOB and started breathing heavy, stated he got very SOB at that time. Once we got back into room, spo2 was 91%.  Adjusted spo2 to 4 liters. After being at 4 liters for 6 minutes, SATs are 96%, but doesn't feel like he is getting enough air and is still breathing deeply and doesn't feel like he did before walking.

## 2018-12-12 NOTE — H&P (Signed)
History and Physical    Andrew [name] OYD:[id number] DOB: October 01, 1941 DOA: 12/12/2018  PCP: Lujean Amel, MD  Patient coming from: Home  Chief Complaint: Shortness of breath  HPI: Andrew [name] is a 78 y.o. 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  Patient reports no cough no fevers no chills he reports no chest pain lower extremity edema or swelling.  He recently had seen his urgent care was given some antibiotics and breathing treatments was sent home and told to come to the emergency department if he did not feel better.  He went home today and still was very short of breath with exertion despite his chronic 2 L at home.  Patient also reports that his pulmonologist with a [facility] here in [place] has been trying to wean him off of his chronic steroids.  He was chronically on prednisone 10 mg a day he was weaned down to 8 mg last week and is supposed to switch to 6 mg in a week or so.  Patient be referred for admission for COPD exacerbation.  Review of Systems: As per HPI otherwise 10 point review of systems negative.   Past Medical History:  Diagnosis Date  . Anemia   . BiPAP (biphasic positive airway pressure) dependence    Pt denies history of OSA  . BPH (benign prostatic hyperplasia)   . Cancer ([place])    skin  . COPD (chronic obstructive pulmonary disease) ([place])   . Dysphagia   . Emphysema lung ([place])   . GERD (gastroesophageal reflux disease)    " Silent reflux"  . Hearing loss    right ear  . History of hiatal hernia   . HLD (hyperlipidemia)   . Hypertension   . Oxygen dependent    2-3 liters  . Oxygen dependent   . Pneumonia   . Pulmonary hypertension ([place])   . Sleep apnea    wears cpap    Past Surgical History:    Procedure Laterality Date  . CARDIAC CATHETERIZATION    . CATARACT EXTRACTION W/ INTRAOCULAR LENS  IMPLANT, BILATERAL    . COLONOSCOPY WITH PROPOFOL N/A 04/20/2018   Procedure: COLONOSCOPY WITH PROPOFOL;  Surgeon: Wilford Corner, MD;  Location: [place];  Service: Endoscopy;  Laterality: N/A;  . ESOPHAGOGASTRODUODENOSCOPY (EGD) WITH PROPOFOL N/A 08/23/2016   Procedure: ESOPHAGOGASTRODUODENOSCOPY (EGD) WITH PROPOFOL;  Surgeon: Wilford Corner, MD;  [facility]COPY;  Service: Endoscopy;  Laterality: N/A;  . ESOPHAGOGASTRODUODENOSCOPY (EGD) WITH PROPOFOL N/A 04/20/2018   Procedure: ESOPHAGOGASTRODUODENOSCOPY (EGD) WITH PROPOFOL;  Surgeon: Wilford Corner, MD;  Location: [place];  Service: Endoscopy;  Laterality: N/A;  . HOT HEMOSTASIS N/A 04/20/2018   Procedure: HOT HEMOSTASIS (ARGON PLASMA COAGULATION/BICAP);  Surgeon: Wilford Corner, MD;  Location: [place];  Service: Endoscopy;  Laterality: N/A;  . LUNG SURGERY    . POLYPECTOMY  04/20/2018   Procedure: POLYPECTOMY;  Surgeon: Wilford Corner, MD;  Location: [facility] ENDOSCOPY;  Service: Endoscopy;;  . TONSILLECTOMY       reports that he quit smoking about 34 years ago. His smoking use included cigarettes. He smoked 0.00 packs per day for 0.00 years. He has never used smokeless tobacco. He reports that he does not drink alcohol or use drugs.  No Known Allergies  Family History  Problem Relation Age of  Onset  . Cancer Maternal Uncle   . Pancreatitis Mother   . Bone cancer Brother   . Stroke Maternal Grandfather   . Congestive Heart Failure Maternal Grandfather     Prior to Admission medications   Medication Sig Start Date End Date Taking? Authorizing Provider  ADVAIR DISKUS 250-50 MCG/DOSE AEPB Inhale 1 puff into the lungs 2 (two) times daily. 11/01/15  Yes Noralee Space, MD  albuterol (PROVENTIL HFA;VENTOLIN HFA) 108 (90 Base) MCG/ACT inhaler Inhale 2 puffs into the lungs every 6 (six) hours as needed for wheezing or  shortness of breath. 04/17/17  Yes Noralee Space, MD  apixaban (ELIQUIS) 5 MG TABS tablet Take 1 tablet (5 mg total) by mouth 2 (two) times daily. 06/25/18  Yes Skeet Latch, MD  diltiazem (CARDIZEM CD) 180 MG 24 hr capsule TAKE 1 CAPSULE EVERY DAY Patient taking differently: Take 180 mg by mouth daily.  11/25/18  Yes Almyra Deforest, PA  finasteride (PROSCAR) 5 MG tablet Take 5 mg by mouth at bedtime.    Yes [provider]  fluticasone (FLONASE) 50 MCG/ACT nasal spray Place 1 spray into both nostrils at bedtime.  06/24/17  Yes [provider]  guaiFENesin (MUCINEX) 600 MG 12 hr tablet Take 1 tablet (600 mg total) by mouth 2 (two) times daily. 09/12/17  Yes Lavina Hamman, MD  hydrocortisone cream 1 % Apply 1 application topically daily as needed (Eczema on face and head).   Yes [provider]  ipratropium-albuterol (DUONEB) 0.5-2.5 (3) MG/3ML SOLN Take 3 mLs by nebulization 3 (three) times daily. 11/04/17  Yes Noralee Space, MD  IRON PO Take 1 tablet by mouth 3 (three) times a week.   Yes [provider]  liver oil-zinc oxide (DESITIN) 40 % ointment Apply 1 application topically daily.   Yes [provider]  Multiple Vitamins-Minerals (MULTIVITAMIN WITH MINERALS) tablet Take 1 tablet by mouth daily.   Yes [provider]  omeprazole (PRILOSEC) 40 MG capsule Take 40 mg by mouth 2 (two) times daily.    Yes [provider]  OXYGEN Inhale 2-3 L into the lungs See admin instructions. 2L daytime, 3L at night    Yes [provider]  polyethylene glycol (MIRALAX / GLYCOLAX) packet Take 17 g by mouth daily. Patient taking differently: Take 17 g by mouth at bedtime. Mix in 8 oz liquid and drink 05/08/16  Yes Mesner, Corene Cornea, MD  predniSONE (DELTASONE) 1 MG tablet Take 8mg  for 2 weeks then decrease to 6mg  until your next office visit. Patient taking differently: Take 8 mg by mouth daily.  12/03/18  Yes Icard, Bradley L, DO  rosuvastatin  (CRESTOR) 10 MG tablet Take 10 mg by mouth daily.   Yes [provider]  sildenafil (REVATIO) 20 MG tablet Take 1 tablet (20 mg total) by mouth 2 (two) times daily. 10/23/16  Yes Noralee Space, MD  SPIRIVA HANDIHALER 18 MCG inhalation capsule INHALE THE CONTENTS OF 1 CAPSULE EVERY DAY Patient taking differently: Place 18 mcg into inhaler and inhale daily.  06/03/17  Yes Noralee Space, MD  tamsulosin (FLOMAX) 0.4 MG CAPS capsule Take 1 capsule (0.4 mg total) by mouth daily after breakfast. 01/03/16  Yes Samtani, Jai-Gurmukh, MD  temazepam (RESTORIL) 30 MG capsule Take 1 capsule (30 mg total) by mouth at bedtime. 07/09/18  Yes Noralee Space, MD  vitamin C (ASCORBIC ACID) 500 MG tablet Take 500 mg by mouth 2 (two) times daily.    Yes  [provider]  VITAMIN D PO Take 1 tablet by mouth daily.   Yes [provider]  budesonide (PULMICORT) 0.5 MG/2ML nebulizer solution Take 2 mLs (0.5 mg total) by nebulization 2 (two) times daily. Patient not taking: Reported on 12/12/2018 12/03/18   June Leap L, DO  budesonide-formoterol (SYMBICORT) 160-4.5 MCG/ACT inhaler Inhale 2 puffs into the lungs 2 (two) times daily. Patient not taking: Reported on 12/12/2018 12/03/18   June Leap L, DO  budesonide-formoterol (SYMBICORT) 160-4.5 MCG/ACT inhaler Inhale 2 puffs into the lungs every 12 (twelve) hours. Patient not taking: Reported on 12/12/2018 12/03/18   Icard, Leory Plowman L, DO  ipratropium-albuterol (DUONEB) 0.5-2.5 (3) MG/3ML SOLN INHALE 1 VIAL VIA NEBULIZER EVERY three times a day Patient not taking: Reported on 12/12/2018 08/31/18   Noralee Space, MD  predniSONE (DELTASONE) 10 MG tablet Take 1 tablet (10 mg total) by mouth daily with breakfast. Patient not taking: Reported on 12/12/2018 09/28/18   Noralee Space, MD  Spacer/Aero-Holding Chambers (AEROCHAMBER MV) inhaler Use as instructed 12/03/18   Icard, Octavio Graves, DO  Tiotropium Bromide Monohydrate (SPIRIVA RESPIMAT) 2.5 MCG/ACT AERS  Inhale 2 puffs into the lungs daily. Patient not taking: Reported on 12/12/2018 12/03/18   June Leap L, DO  Tiotropium Bromide Monohydrate (SPIRIVA RESPIMAT) 2.5 MCG/ACT AERS Inhale 2 puffs into the lungs daily. Patient not taking: Reported on 12/12/2018 12/03/18   Garner Nash, DO    Physical Exam: Vitals:   12/12/18 1825 12/12/18 1827 12/12/18 1830 12/12/18 1900  BP:   113/71 114/79  Pulse:   98 83  Resp:   20 17  Temp:  97.9 F (36.6 C)    TempSrc:  Oral    SpO2:   97% 99%  Weight: 60.3 kg     Height: 5\' 7"  (1.702 m)       Constitutional: NAD, calm, comfortable Vitals:   12/12/18 1825 12/12/18 1827 12/12/18 1830 12/12/18 1900  BP:   113/71 114/79  Pulse:   98 83  Resp:   20 17  Temp:  97.9 F (36.6 C)    TempSrc:  Oral    SpO2:   97% 99%  Weight: 60.3 kg     Height: 5\' 7"  (1.702 m)      Eyes: PERRL, lids and conjunctivae normal ENMT: Mucous membranes are moist. Posterior pharynx clear of any exudate or lesions.Normal dentition.  Neck: normal, supple, no masses, no thyromegaly Respiratory: clear to auscultation bilaterally, no wheezing, no crackles. Normal respiratory effort. No accessory muscle use.  Diminished on the right upper Cardiovascular: Regular rate and rhythm, no murmurs / rubs / gallops. No extremity edema. 2+ pedal pulses. No carotid bruits.  Abdomen: no tenderness, no masses palpated. No hepatosplenomegaly. Bowel sounds positive.  Musculoskeletal: no clubbing / cyanosis. No joint deformity upper and lower extremities. Good ROM, no contractures. Normal muscle tone.  Skin: no rashes, lesions, ulcers. No induration Neurologic: CN 2-12 grossly intact. Sensation intact, DTR normal. Strength 5/5 in all 4.  Psychiatric: Normal judgment and insight. Alert and oriented x 3. Normal mood.    Labs on Admission: I have personally reviewed following labs and imaging studies  CBC: Recent Labs  Lab 12/12/18 1845  WBC 8.1  NEUTROABS 7.7  HGB 15.4  HCT 46.1    MCV 98.5  PLT 833   Basic Metabolic Panel: Recent Labs  Lab 12/12/18 1845  NA 139  K 4.3  CL 102  CO2 24  GLUCOSE 207*  BUN 20  CREATININE 1.03  CALCIUM 9.4   GFR: Estimated Creatinine Clearance: 49.6 mL/min (by C-G formula based on SCr of 1.03 mg/dL). Liver Function Tests: Recent Labs  Lab 12/12/18 1845  AST 21  ALT 30  ALKPHOS 63  BILITOT 0.7  PROT 6.8  ALBUMIN 4.1   No results for input(s): LIPASE, AMYLASE in the last 168 hours. No results for input(s): AMMONIA in the last 168 hours. Coagulation Profile: No results for input(s): INR, PROTIME in the last 168 hours. Cardiac Enzymes: No results for input(s): CKTOTAL, CKMB, CKMBINDEX, TROPONINI in the last 168 hours. BNP (last 3 results) Recent Labs    09/28/18 1242  PROBNP 23.0   HbA1C: No results for input(s): HGBA1C in the last 72 hours. CBG: No results for input(s): GLUCAP in the last 168 hours. Lipid Profile: No results for input(s): CHOL, HDL, LDLCALC, TRIG, CHOLHDL, LDLDIRECT in the last 72 hours. Thyroid Function Tests: No results for input(s): TSH, T4TOTAL, FREET4, T3FREE, THYROIDAB in the last 72 hours. Anemia Panel: No results for input(s): VITAMINB12, FOLATE, FERRITIN, TIBC, IRON, RETICCTPCT in the last 72 hours. Urine analysis:    Component Value Date/Time   COLORURINE STRAW (A) 04/26/2018 0330   APPEARANCEUR CLEAR 04/26/2018 0330   LABSPEC 1.008 04/26/2018 0330   PHURINE 6.0 04/26/2018 0330   GLUCOSEU NEGATIVE 04/26/2018 0330   HGBUR NEGATIVE 04/26/2018 0330   BILIRUBINUR NEGATIVE 04/26/2018 0330   KETONESUR 5 (A) 04/26/2018 0330   PROTEINUR NEGATIVE 04/26/2018 0330   NITRITE NEGATIVE 04/26/2018 0330   LEUKOCYTESUR NEGATIVE 04/26/2018 0330   Sepsis Labs: !!!!!!!!!!!!!!!!!!!!!!!!!!!!!!!!!!!!!!!!!!!! @LABRCNTIP (procalcitonin:4,lacticidven:4) )No results found for this or any previous visit (from the past 240 hour(s)).   Radiological Exams on Admission: Dg Chest Port 1 View  Result  Date: 12/12/2018 CLINICAL DATA:  Shortness of breath. History of lung cancer and COPD. EXAM: PORTABLE CHEST 1 VIEW COMPARISON:  09/28/2018 FINDINGS: Cardiomediastinal silhouette is normal. Mediastinal contours appear intact. Tortuosity of the aorta. There is no evidence of focal airspace consolidation, pleural effusion or pneumothorax. Postsurgical changes in the right mid thorax. Severe upper lobe predominant emphysema. Osseous structures are without acute abnormality. Soft tissues are grossly normal. IMPRESSION: Severe upper lobe predominant emphysema. No evidence of acute lobar consolidation. Stable postsurgical changes in the right hemithorax. Electronically Signed   By: Fidela Salisbury M.D.   On: 12/12/2018 19:32    Old chart reviewed Case discussed with Dr. Etheleen Mayhew the resident in the ED Chest x-ray reviewed no focal infiltrate or edema  Assessment/Plan 78 year old male with shortness of breath with advanced COPD Principal Problem:   COPD exacerbation (HCC)-suspect this is due to weaning of his steroids that he is not tolerating.  Chest x-ray is negative.  He has no cardinal signs of infection.  Will place on IV Solu-Medrol 80 mg IV daily 12 hours he is already received a bolus dose of 125 mg.  Will place him on frequent nebulizer treatments.  I do not think antibiotics are indicated at this time.  He has a CT of his chest arranged for Monday and PFTs arranged for Tuesday with his pulmonologist.  He is following very closely with his pulmonologist Dr. Valeta Harms as they are trying to wean him off of steroids chronically as stated in HPI.  Will observe patient on medical bed overnight.  Hopefully steroids will help him.  Active Problems:   Chronic hypoxemic respiratory failure (HCC)-currently with normal O2 sats on 2 L.  But low normal at 91%.  Even with exertion he does not  drop below 88% on his 2 L.    History of pulmonary hypertension-continue home meds    Hypertension-noted    Paroxysmal  atrial fibrillation (HCC)-continues anticoagulation.  Currently rate controlled.    Current chronic use of systemic steroids-pulse dose with steroids as above.     DVT prophylaxis: On Eliquis Code Status: Full Family Communication: None Disposition Plan: Likely tomorrow Consults called: None Admission status: Observation   Tlaloc Taddei A MD [facility]  If 7PM-7AM, please contact night-coverage [email or web address] Password [facility]  12/12/2018, 10:26 PM

## 2018-12-12 NOTE — ED Triage Notes (Signed)
PT was dx with pneumonia today at an UC and given breathing tx and oral antibiotics.  States he still feels very sob.  Breathing labored.  Pt states his sats are normally 98% on 2L home o2 and he hasn't been able to get it above 93%.

## 2018-12-13 ENCOUNTER — Observation Stay (HOSPITAL_COMMUNITY): Payer: Medicare Other

## 2018-12-13 ENCOUNTER — Other Ambulatory Visit: Payer: Self-pay

## 2018-12-13 ENCOUNTER — Encounter (HOSPITAL_COMMUNITY): Payer: Self-pay

## 2018-12-13 DIAGNOSIS — K449 Diaphragmatic hernia without obstruction or gangrene: Secondary | ICD-10-CM | POA: Diagnosis present

## 2018-12-13 DIAGNOSIS — Z6820 Body mass index (BMI) 20.0-20.9, adult: Secondary | ICD-10-CM | POA: Diagnosis not present

## 2018-12-13 DIAGNOSIS — I272 Pulmonary hypertension, unspecified: Secondary | ICD-10-CM | POA: Diagnosis present

## 2018-12-13 DIAGNOSIS — R636 Underweight: Secondary | ICD-10-CM | POA: Diagnosis present

## 2018-12-13 DIAGNOSIS — J9611 Chronic respiratory failure with hypoxia: Secondary | ICD-10-CM | POA: Diagnosis not present

## 2018-12-13 DIAGNOSIS — Z9981 Dependence on supplemental oxygen: Secondary | ICD-10-CM | POA: Diagnosis not present

## 2018-12-13 DIAGNOSIS — Z9841 Cataract extraction status, right eye: Secondary | ICD-10-CM | POA: Diagnosis not present

## 2018-12-13 DIAGNOSIS — Z85828 Personal history of other malignant neoplasm of skin: Secondary | ICD-10-CM | POA: Diagnosis not present

## 2018-12-13 DIAGNOSIS — Z7951 Long term (current) use of inhaled steroids: Secondary | ICD-10-CM | POA: Diagnosis not present

## 2018-12-13 DIAGNOSIS — Z9842 Cataract extraction status, left eye: Secondary | ICD-10-CM | POA: Diagnosis not present

## 2018-12-13 DIAGNOSIS — N400 Enlarged prostate without lower urinary tract symptoms: Secondary | ICD-10-CM | POA: Diagnosis present

## 2018-12-13 DIAGNOSIS — Z7952 Long term (current) use of systemic steroids: Secondary | ICD-10-CM | POA: Diagnosis not present

## 2018-12-13 DIAGNOSIS — Z87891 Personal history of nicotine dependence: Secondary | ICD-10-CM | POA: Diagnosis not present

## 2018-12-13 DIAGNOSIS — R05 Cough: Secondary | ICD-10-CM | POA: Diagnosis not present

## 2018-12-13 DIAGNOSIS — Z79899 Other long term (current) drug therapy: Secondary | ICD-10-CM | POA: Diagnosis not present

## 2018-12-13 DIAGNOSIS — K219 Gastro-esophageal reflux disease without esophagitis: Secondary | ICD-10-CM | POA: Diagnosis present

## 2018-12-13 DIAGNOSIS — G4733 Obstructive sleep apnea (adult) (pediatric): Secondary | ICD-10-CM | POA: Diagnosis present

## 2018-12-13 DIAGNOSIS — H9191 Unspecified hearing loss, right ear: Secondary | ICD-10-CM | POA: Diagnosis present

## 2018-12-13 DIAGNOSIS — I10 Essential (primary) hypertension: Secondary | ICD-10-CM | POA: Diagnosis present

## 2018-12-13 DIAGNOSIS — G4730 Sleep apnea, unspecified: Secondary | ICD-10-CM | POA: Diagnosis present

## 2018-12-13 DIAGNOSIS — J431 Panlobular emphysema: Secondary | ICD-10-CM | POA: Diagnosis not present

## 2018-12-13 DIAGNOSIS — Z961 Presence of intraocular lens: Secondary | ICD-10-CM | POA: Diagnosis present

## 2018-12-13 DIAGNOSIS — J441 Chronic obstructive pulmonary disease with (acute) exacerbation: Secondary | ICD-10-CM | POA: Diagnosis present

## 2018-12-13 DIAGNOSIS — R0602 Shortness of breath: Secondary | ICD-10-CM | POA: Diagnosis not present

## 2018-12-13 DIAGNOSIS — Z8679 Personal history of other diseases of the circulatory system: Secondary | ICD-10-CM | POA: Diagnosis not present

## 2018-12-13 DIAGNOSIS — E785 Hyperlipidemia, unspecified: Secondary | ICD-10-CM | POA: Diagnosis present

## 2018-12-13 DIAGNOSIS — Z7901 Long term (current) use of anticoagulants: Secondary | ICD-10-CM | POA: Diagnosis not present

## 2018-12-13 DIAGNOSIS — I480 Paroxysmal atrial fibrillation: Secondary | ICD-10-CM | POA: Diagnosis present

## 2018-12-13 DIAGNOSIS — J9621 Acute and chronic respiratory failure with hypoxia: Secondary | ICD-10-CM | POA: Diagnosis present

## 2018-12-13 DIAGNOSIS — R1310 Dysphagia, unspecified: Secondary | ICD-10-CM | POA: Diagnosis present

## 2018-12-13 LAB — CBC WITH DIFFERENTIAL/PLATELET
ABS IMMATURE GRANULOCYTES: 0.02 10*3/uL (ref 0.00–0.07)
Basophils Absolute: 0 10*3/uL (ref 0.0–0.1)
Basophils Relative: 0 %
Eosinophils Absolute: 0 10*3/uL (ref 0.0–0.5)
Eosinophils Relative: 0 %
HCT: 42.3 % (ref 39.0–52.0)
Hemoglobin: 14.2 g/dL (ref 13.0–17.0)
Immature Granulocytes: 0 %
Lymphocytes Relative: 11 %
Lymphs Abs: 0.5 10*3/uL — ABNORMAL LOW (ref 0.7–4.0)
MCH: 32.7 pg (ref 26.0–34.0)
MCHC: 33.6 g/dL (ref 30.0–36.0)
MCV: 97.5 fL (ref 80.0–100.0)
Monocytes Absolute: 0.1 10*3/uL (ref 0.1–1.0)
Monocytes Relative: 1 %
NEUTROS PCT: 88 %
NRBC: 0 % (ref 0.0–0.2)
Neutro Abs: 4 10*3/uL (ref 1.7–7.7)
Platelets: 211 10*3/uL (ref 150–400)
RBC: 4.34 MIL/uL (ref 4.22–5.81)
RDW: 12.3 % (ref 11.5–15.5)
WBC: 4.6 10*3/uL (ref 4.0–10.5)

## 2018-12-13 LAB — BASIC METABOLIC PANEL
Anion gap: 12 (ref 5–15)
BUN: 20 mg/dL (ref 8–23)
CO2: 24 mmol/L (ref 22–32)
Calcium: 9.1 mg/dL (ref 8.9–10.3)
Chloride: 102 mmol/L (ref 98–111)
Creatinine, Ser: 0.99 mg/dL (ref 0.61–1.24)
GFR calc Af Amer: >60 mL/min (ref >60)
GFR calc non Af Amer: >60 mL/min (ref >60)
Glucose, Bld: 136 mg/dL — ABNORMAL HIGH (ref 70–99)
POTASSIUM: 4.1 mmol/L (ref 3.5–5.1)
Sodium: 138 mmol/L (ref 135–145)

## 2018-12-13 MED ORDER — BUDESONIDE 0.25 MG/2ML IN SUSP
0.2500 mg | Freq: Two times a day (BID) | RESPIRATORY_TRACT | Status: DC
  Administered 2018-12-13 – 2018-12-15 (×5): 0.25 mg via RESPIRATORY_TRACT
  Filled 2018-12-13 (×5): qty 2

## 2018-12-13 MED ORDER — MORPHINE SULFATE (PF) 2 MG/ML IV SOLN: 1.0000 mg | INTRAVENOUS | Status: DC | PRN

## 2018-12-13 MED ORDER — IPRATROPIUM-ALBUTEROL 0.5-2.5 (3) MG/3ML IN SOLN: 3.0000 mL | Freq: Once | RESPIRATORY_TRACT | Status: DC

## 2018-12-13 NOTE — Progress Notes (Signed)
Patient received on unit from 6N Vinnie Level, RN).  Vital signs obtained patient oriented to unit, bed in lowest position, call bell within reach.

## 2018-12-13 NOTE — Progress Notes (Signed)
  Placed patient on CPAP via nasal mask, 7.0 cm H20 per pt home settings with 3 lpm O2 bleed in (home O2 HS).  Tolerating well at this time. RN aware.

## 2018-12-13 NOTE — Progress Notes (Addendum)
PROGRESS NOTE  Andrew [name] PPI:[id number] DOB: 1941/07/07 DOA: 12/12/2018 PCP: Lujean Amel, MD   LOS: 0 days   Brief Narrative / Interim history: 78 year old male with history of two belbectomy at [facility], in 2007, also right upper lobe and right middle lobe resection 2007, chronic hypoxic respiratory failure on 2 L oxygen at home, advanced COPD/emphysema came in with several weeks of progressive worsening dyspnea on exertion.  He is seen regularly by pulmonology as an outpatient, just saw Dr. Valeta Harms earlier this month.  They have been trying to wean off his prednisone and currently he was weaned off to 8 mg from 10.  He was found to be in respiratory distress in the ER and was admitted for COPD exacerbation  Subjective: -Patient initially evaluated earlier in the morning, he was feeling better, and hoping for home discharge later today.  I was paged later in the morning that he is in severe acute respiratory distress as he seems to have aspirated 1 of his morning medications.  Assessment & Plan: Principal Problem:   COPD exacerbation ([place]) Active Problems:   Chronic hypoxemic respiratory failure (HCC)   History of pulmonary hypertension   Hypertension   Paroxysmal atrial fibrillation (HCC)   Current chronic use of systemic steroids   Principal Problem Acute on chronic hypoxic respiratory failure due to COPD exacerbation -Episode of acute respiratory distress later this morning due to likely aspirating his morning medications. -Increase oxygen to maintain O2 sats above 88%, placed on continuous pulse ox, currently on 40% FiO2 -Nebulizer to be given by respiratory therapy, add flutter valve, continue IV steroids, continue Pulmicort -Obtain speech eval, stat chest x-ray -Transfer to progressive/stepdown given acute respiratory distress in the setting of severe COPD  Active Problems Paroxysmal A. fib -Currently in sinus, continue diltiazem along with  Eliquis  Hyperlipidemia -Continue statin  Pulmonary hypertension -Continue home medication with sildenafil  Obstructive sleep apnea -Continue nightly CPAP   Scheduled Meds: . apixaban  5 mg Oral BID  . budesonide (PULMICORT) nebulizer solution  0.25 mg Nebulization BID  . diltiazem  180 mg Oral Daily  . finasteride  5 mg Oral QHS  . fluticasone  1 spray Each Nare QHS  . guaiFENesin  600 mg Oral BID  . ipratropium-albuterol  3 mL Nebulization Q6H  . ipratropium-albuterol  3 mL Nebulization Once  . methylPREDNISolone (SOLU-MEDROL) injection  80 mg Intravenous Q12H  . multivitamin with minerals  1 tablet Oral Daily  . rosuvastatin  10 mg Oral Daily  . sildenafil  20 mg Oral BID  . sodium chloride flush  3 mL Intravenous Q12H  . tamsulosin  0.4 mg Oral QPC breakfast  . temazepam  30 mg Oral QHS  . vitamin C  500 mg Oral BID   Continuous Infusions: . sodium chloride     PRN Meds:.sodium chloride, albuterol, sodium chloride flush  DVT prophylaxis: Eliquis Code Status: Full code Family Communication: no family at bedside  Disposition Plan: transfer to progressive  Consultants:   None   Procedures:   None   Antimicrobials:  None    Objective: Vitals:   12/13/18 0446 12/13/18 0916 12/13/18 0928 12/13/18 1037  BP: 133/83  (!) 142/68   Pulse: 86     Resp: 20     Temp: 97.9 F (36.6 C)     TempSrc: Oral     SpO2: 98% 97%  90%  Weight:      Height:  Intake/Output Summary (Last 24 hours) at 12/13/2018 1044 Last data filed at 12/13/2018 9924 Gross per 24 hour  Intake 563 ml  Output 350 ml  Net 213 ml   Filed Weights   12/12/18 1825  Weight: 60.3 kg    Examination:  Constitutional: Increased work of breathing, tachypneic, appears in slight distress Eyes: PERRL, lids and conjunctivae normal ENMT: Mucous membranes are moist. No oropharyngeal exudates Neck: normal, supple Respiratory: Diffuse bilateral wheezing, tachypnea, increased respiratory  effort with accessory muscle use Cardiovascular: Regular rate and rhythm, no murmurs / rubs / gallops. No LE edema. 2+ pedal pulses.  Abdomen: no tenderness. Bowel sounds positive.  Musculoskeletal: no clubbing / cyanosis. Skin: no rashes, lesions, ulcers. No induration Neurologic: CN 2-12 grossly intact. Strength 5/5 in all 4.  Psychiatric: Normal judgment and insight. Alert and oriented x 3. Normal mood.    Data Reviewed: I have independently reviewed following labs and imaging studies  Chest x-ray 1/26-no infiltrates  CBC: Recent Labs  Lab 12/12/18 1845 12/13/18 0415  WBC 8.1 4.6  NEUTROABS 7.7 4.0  HGB 15.4 14.2  HCT 46.1 42.3  MCV 98.5 97.5  PLT 216 268   Basic Metabolic Panel: Recent Labs  Lab 12/12/18 1845 12/13/18 0415  NA 139 138  K 4.3 4.1  CL 102 102  CO2 24 24  GLUCOSE 207* 136*  BUN 20 20  CREATININE 1.03 0.99  CALCIUM 9.4 9.1   GFR: Estimated Creatinine Clearance: 51.6 mL/min (by C-G formula based on SCr of 0.99 mg/dL). Liver Function Tests: Recent Labs  Lab 12/12/18 1845  AST 21  ALT 30  ALKPHOS 63  BILITOT 0.7  PROT 6.8  ALBUMIN 4.1   No results for input(s): LIPASE, AMYLASE in the last 168 hours. No results for input(s): AMMONIA in the last 168 hours. Coagulation Profile: No results for input(s): INR, PROTIME in the last 168 hours. Cardiac Enzymes: No results for input(s): CKTOTAL, CKMB, CKMBINDEX, TROPONINI in the last 168 hours. BNP (last 3 results) Recent Labs    09/28/18 1242  PROBNP 23.0   HbA1C: No results for input(s): HGBA1C in the last 72 hours. CBG: No results for input(s): GLUCAP in the last 168 hours. Lipid Profile: No results for input(s): CHOL, HDL, LDLCALC, TRIG, CHOLHDL, LDLDIRECT in the last 72 hours. Thyroid Function Tests: No results for input(s): TSH, T4TOTAL, FREET4, T3FREE, THYROIDAB in the last 72 hours. Anemia Panel: No results for input(s): VITAMINB12, FOLATE, FERRITIN, TIBC, IRON, RETICCTPCT in the last  72 hours. Urine analysis:    Component Value Date/Time   COLORURINE STRAW (A) 04/26/2018 0330   APPEARANCEUR CLEAR 04/26/2018 0330   LABSPEC 1.008 04/26/2018 0330   PHURINE 6.0 04/26/2018 0330   GLUCOSEU NEGATIVE 04/26/2018 0330   HGBUR NEGATIVE 04/26/2018 0330   BILIRUBINUR NEGATIVE 04/26/2018 0330   KETONESUR 5 (A) 04/26/2018 0330   PROTEINUR NEGATIVE 04/26/2018 0330   NITRITE NEGATIVE 04/26/2018 0330   LEUKOCYTESUR NEGATIVE 04/26/2018 0330   Sepsis Labs: Invalid input(s): PROCALCITONIN, LACTICIDVEN  No results found for this or any previous visit (from the past 240 hour(s)).    Radiology Studies: Dg Chest Port 1 View  Result Date: 12/12/2018 CLINICAL DATA:  Shortness of breath. History of lung cancer and COPD. EXAM: PORTABLE CHEST 1 VIEW COMPARISON:  09/28/2018 FINDINGS: Cardiomediastinal silhouette is normal. Mediastinal contours appear intact. Tortuosity of the aorta. There is no evidence of focal airspace consolidation, pleural effusion or pneumothorax. Postsurgical changes in the right mid thorax. Severe upper lobe predominant emphysema.  Osseous structures are without acute abnormality. Soft tissues are grossly normal. IMPRESSION: Severe upper lobe predominant emphysema. No evidence of acute lobar consolidation. Stable postsurgical changes in the right hemithorax. Electronically Signed   By: Fidela Salisbury M.D.   On: 12/12/2018 19:32   Marzetta Board, MD, PhD [facility]  Contact via  [email or web address]  [place] P: [phone]  F: [phone]

## 2018-12-13 NOTE — Progress Notes (Signed)
PT Cancellation Note  Patient Details Name: Andrew [name] MRN: [id number] DOB: [birth year]   Cancelled Treatment:    Reason Eval/Treat Not Completed: Medical issues which prohibited therapy. Pt with decline in respiratory status. Currently on 8 L O2 via mask. Increased RR and increased WOB noted. Pt reports he feels like he aspirated a pill. Pt being transferred to stepdown unit. PT to follow up tomorrow.   Lorriane Shire 12/13/2018, 11:22 AM  Lorrin Goodell, PT  Office # [phone] Pager [phone]

## 2018-12-13 NOTE — Progress Notes (Addendum)
When pt was taking his PO meds this AM he swallowed one of his pills incorrectly and started to cough.  He was able to cough up one of the pills - advised it was only 1/2 of pill that came up.  He continued to cough for 1 hour and c/o of shortness of breath. MD was notified of the situation. Orders received. Pt continue to have respiratory issues, and was transferred to 4E to continue to be monitored

## 2018-12-13 NOTE — Progress Notes (Signed)
SLP Cancellation Note  Patient Details Name: Andrew [name] MRN: [id number] DOB: [birth year]   Cancelled treatment:       Reason Eval/Treat Not Completed: Medical issues which prohibited therapy. Pt in respiratory distress after feeling like he aspirated a pill earlier this morning. He is about to transfer to stepdown unit. SLP will f/u for swallow evaluation as his respiratory status stabilizes.    [name] Cristine Daw 12/13/2018, 12:08 PM  Nuala Alpha, M.A. [place] Acute Environmental education officer [phone] Office [phone]

## 2018-12-13 NOTE — Progress Notes (Signed)
Receive pt alert and oriented. Pt on 2 L O2 per nasal canula. Pt ambulated to room with standby assist. Labored breathing noted. Pt denies pain. Oriented pt with room and use of call light. Will monitor pt.

## 2018-12-13 NOTE — Evaluation (Signed)
Clinical/Bedside Swallow Evaluation Patient Details  Name: Andrew [name] MRN: [id number] Date of Birth: [birth year]  Today's Date: 12/13/2018 Time: SLP Start Time (ACUTE ONLY): 1410 SLP Stop Time (ACUTE ONLY): 1426 SLP Time Calculation (min) (ACUTE ONLY): 16 min  Past Medical History:  Past Medical History:  Diagnosis Date  . Anemia   . BiPAP (biphasic positive airway pressure) dependence    Pt denies history of OSA  . BPH (benign prostatic hyperplasia)   . Cancer ([place])    skin  . COPD (chronic obstructive pulmonary disease) ([place])   . Dysphagia   . Emphysema lung ([place])   . GERD (gastroesophageal reflux disease)    " Silent reflux"  . Hearing loss    right ear  . History of hiatal hernia   . HLD (hyperlipidemia)   . Hypertension   . Oxygen dependent    2-3 liters  . Oxygen dependent   . Pneumonia   . Pulmonary hypertension ([place])   . Sleep apnea    wears cpap   Past Surgical History:  Past Surgical History:  Procedure Laterality Date  . CARDIAC CATHETERIZATION    . CATARACT EXTRACTION W/ INTRAOCULAR LENS  IMPLANT, BILATERAL    . COLONOSCOPY WITH PROPOFOL N/A 04/20/2018   Procedure: COLONOSCOPY WITH PROPOFOL;  Surgeon: Wilford Corner, MD;  Location: [place];  Service: Endoscopy;  Laterality: N/A;  . ESOPHAGOGASTRODUODENOSCOPY (EGD) WITH PROPOFOL N/A 08/23/2016   Procedure: ESOPHAGOGASTRODUODENOSCOPY (EGD) WITH PROPOFOL;  Surgeon: Wilford Corner, MD;  Location: [facility] ENDOSCOPY;  Service: Endoscopy;  Laterality: N/A;  . ESOPHAGOGASTRODUODENOSCOPY (EGD) WITH PROPOFOL N/A 04/20/2018   Procedure: ESOPHAGOGASTRODUODENOSCOPY (EGD) WITH PROPOFOL;  Surgeon: Wilford Corner, MD;  Location: [place];  Service: Endoscopy;  Laterality: N/A;  . HOT HEMOSTASIS N/A 04/20/2018   Procedure: HOT HEMOSTASIS (ARGON PLASMA COAGULATION/BICAP);  Surgeon: Wilford Corner, MD;  Location: [place];  Service: Endoscopy;  Laterality: N/A;  . LUNG SURGERY    . POLYPECTOMY  04/20/2018   Procedure: POLYPECTOMY;  Surgeon: Wilford Corner, MD;  Location: [facility] END[facility]COPY;  Service: Endoscopy;;  . TONSILLECTOMY     HPI:  Pt is a 78 yo male admitted with SOB due to COPD exacerbation (currently trying to be weaned from steroids by his pulmonologist). On 1/26 he had an episode in which he felt like he aspirated a pill, after which he had increased respiratory distress. MBS in 2018 recommended regular diet, thin liquids. He had mild pharyngeal residue that cleared well with additional swallows. PMH includes: sleep apnea, PNA, HTN, HLD, HH, GERD, emphysema   Assessment / Plan / Recommendation Clinical Impression  Pt's oropharyngeal swallow appears consistent with previous evaluations, with most recent MBS in 2018 not revealing any aspiration. Pt believes his swallow function has been stable, and that the suspected aspiration event this morning was a "one time" occurence. He says he took a "handful" of pills at a time, which is what he typically does at home. Recommend restarting regular diet textures, thin liquids, but would at least take pills one at a time and use additional precautions while he is experiencing acute respiratory changes. SLP will f/u briefly to ensure tolerance.  SLP Visit Diagnosis: Dysphagia, unspecified (R13.10)    Aspiration Risk  Mild aspiration risk    Diet Recommendation Regular;Thin liquid   Liquid Administration via: Cup;Straw Medication Administration: Whole meds with liquid(one at a time) Supervision: Patient able to self feed;Intermittent supervision to cue for compensatory strategies Compensations: Slow rate;Small sips/bites Postural Changes: Seated upright at 90 degrees;Remain upright for at  least 30 minutes after po intake    Other  Recommendations Oral Care Recommendations: Oral care BID   Follow up Recommendations None      Frequency and Duration min 1 x/week  1 week       Prognosis Prognosis for Safe Diet Advancement: Good      Swallow  Study   General HPI: Pt is a 78 yo male admitted with SOB due to COPD exacerbation (currently trying to be weaned from steroids by his pulmonologist). On 1/26 he had an episode in which he felt like he aspirated a pill, after which he had increased respiratory distress. MBS in 2018 recommended regular diet, thin liquids. He had mild pharyngeal residue that cleared well with additional swallows. PMH includes: sleep apnea, PNA, HTN, HLD, HH, GERD, emphysema Type of Study: Bedside Swallow Evaluation Previous Swallow Assessment: see HPI Diet Prior to this Study: NPO Temperature Spikes Noted: No Respiratory Status: Nasal cannula History of Recent Intubation: No Behavior/Cognition: Alert;Cooperative;Pleasant mood Oral Cavity Assessment: Within Functional Limits Oral Care Completed by SLP: No Oral Cavity - Dentition: Adequate natural dentition;Missing dentition Vision: Functional for self-feeding Self-Feeding Abilities: Able to feed self Patient Positioning: Upright in bed Baseline Vocal Quality: Normal Volitional Cough: Strong Volitional Swallow: Able to elicit    Oral/Motor/Sensory Function Overall Oral Motor/Sensory Function: Within functional limits   Ice Chips Ice chips: Not tested   Thin Liquid Thin Liquid: Impaired Presentation: Cup;Self Fed;Straw Pharyngeal  Phase Impairments: Multiple swallows    Nectar Thick Nectar Thick Liquid: Not tested   Honey Thick Honey Thick Liquid: Not tested   Puree Puree: Impaired Presentation: Self Fed;Spoon Pharyngeal Phase Impairments: Multiple swallows   Solid     Solid: Impaired Presentation: Self Fed Pharyngeal Phase Impairments: Multiple swallows      [name] Joshuan Bolander 12/13/2018,2:40 PM   [name] Bertine Schlottman, M.A. [place] Acute Environmental education officer [phone] Office [phone]

## 2018-12-14 ENCOUNTER — Other Ambulatory Visit: Payer: Medicare Other

## 2018-12-14 ENCOUNTER — Telehealth: Payer: Self-pay | Admitting: Pulmonary Disease

## 2018-12-14 ENCOUNTER — Inpatient Hospital Stay (HOSPITAL_COMMUNITY): Payer: Medicare Other

## 2018-12-14 LAB — RESPIRATORY PANEL BY PCR
ADENOVIRUS-RVPPCR: NOT DETECTED
Bordetella pertussis: NOT DETECTED
CORONAVIRUS HKU1-RVPPCR: NOT DETECTED
Chlamydophila pneumoniae: NOT DETECTED
Coronavirus 229E: NOT DETECTED
Coronavirus NL63: NOT DETECTED
Coronavirus OC43: NOT DETECTED
Influenza A: NOT DETECTED
Influenza B: NOT DETECTED
Metapneumovirus: NOT DETECTED
Mycoplasma pneumoniae: NOT DETECTED
PARAINFLUENZA VIRUS 1-RVPPCR: NOT DETECTED
Parainfluenza Virus 2: NOT DETECTED
Parainfluenza Virus 3: NOT DETECTED
Parainfluenza Virus 4: NOT DETECTED
Respiratory Syncytial Virus: NOT DETECTED
Rhinovirus / Enterovirus: NOT DETECTED

## 2018-12-14 LAB — CBC
HCT: 40.9 % (ref 39.0–52.0)
Hemoglobin: 13.9 g/dL (ref 13.0–17.0)
MCH: 33.3 pg (ref 26.0–34.0)
MCHC: 34 g/dL (ref 30.0–36.0)
MCV: 98.1 fL (ref 80.0–100.0)
Platelets: 215 10*3/uL (ref 150–400)
RBC: 4.17 MIL/uL — ABNORMAL LOW (ref 4.22–5.81)
RDW: 12.6 % (ref 11.5–15.5)
WBC: 9.9 10*3/uL (ref 4.0–10.5)
nRBC: 0 % (ref 0.0–0.2)

## 2018-12-14 LAB — BASIC METABOLIC PANEL
Anion gap: 11 (ref 5–15)
BUN: 25 mg/dL — ABNORMAL HIGH (ref 8–23)
CO2: 26 mmol/L (ref 22–32)
Calcium: 9.1 mg/dL (ref 8.9–10.3)
Chloride: 100 mmol/L (ref 98–111)
Creatinine, Ser: 0.92 mg/dL (ref 0.61–1.24)
GFR calc Af Amer: >60 mL/min (ref >60)
GFR calc non Af Amer: >60 mL/min (ref >60)
Glucose, Bld: 134 mg/dL — ABNORMAL HIGH (ref 70–99)
Potassium: 4.4 mmol/L (ref 3.5–5.1)
SODIUM: 137 mmol/L (ref 135–145)

## 2018-12-14 MED ORDER — ZOLPIDEM TARTRATE 5 MG PO TABS
5.0000 mg | ORAL_TABLET | Freq: Every evening | ORAL | Status: DC | PRN
  Administered 2018-12-14: 5 mg via ORAL
  Filled 2018-12-14: qty 1

## 2018-12-14 MED ORDER — ENSURE ENLIVE PO LIQD
237.0000 mL | Freq: Every day | ORAL | Status: DC
  Administered 2018-12-14: 237 mL via ORAL

## 2018-12-14 NOTE — Discharge Instructions (Signed)

## 2018-12-14 NOTE — Progress Notes (Signed)
PROGRESS NOTE  Andrew [name] SJG:[id number] DOB: 06-Aug-1941 DOA: 12/12/2018 PCP: Lujean Amel, MD   LOS: 1 day   Brief Narrative / Interim history: 78 year old male with history of two belbectomy at [facility], in 2007, also right upper lobe and right middle lobe resection 2007, chronic hypoxic respiratory failure on 2 L oxygen at home, advanced COPD/emphysema came in with several weeks of progressive worsening dyspnea on exertion.  He is seen regularly by pulmonology as an outpatient, just saw Dr. Valeta Harms earlier this month.  They have been trying to wean off his prednisone and currently he was weaned off to 8 mg from 10.  He was found to be in respiratory distress in the ER and was admitted for COPD exacerbation  Subjective: -Tolerating a diet without further aspiration events.  Still feeling quite short of breath.  Was able to ambulate a little bit in the hallway and feels better but not yet back to baseline.  Assessment & Plan: Principal Problem:   COPD exacerbation ([place]) Active Problems:   Chronic hypoxemic respiratory failure (H[facility])   History of pulmonary hypertension   Hypertension   Paroxysmal atrial fibrillation ([facility])   Current chronic use of systemic steroids   Principal Problem Acute on chronic hypoxic respiratory failure due to COPD exacerbation -Episode of acute respiratory distress on 1/26 AM due to aspirating home medications requiring transfer to stepdown, initially requiring 40% FiO2 via facemask but then later last night weaned back off to nasal cannula -Chest x-ray without acute findings. -Dyspnea improving this morning, not yet back to baseline, was ambulating bit outside the room.  CT scan of the chest with advanced emphysema but no real acute findings. -I have discussed with Dr. Valeta Harms, patient's primary pulmonologist regarding delay of obtaining PFTs in the setting of COPD exacerbation.  He would also like a respiratory virus panel sent which has  been ordered -Continue current regimen with steroids, nebulizers  Active Problems Paroxysmal A. fib -Sinus on telemetry, continue diltiazem and Eliquis  Hyperlipidemia -Continue statin  Pulmonary hypertension -Continue home medication with sildenafil  Obstructive sleep apnea -Continue nightly CPAP   Scheduled Meds: . apixaban  5 mg Oral BID  . budesonide (PULMICORT) nebulizer solution  0.25 mg Nebulization BID  . diltiazem  180 mg Oral Daily  . finasteride  5 mg Oral QHS  . fluticasone  1 spray Each Nare QHS  . guaiFENesin  600 mg Oral BID  . ipratropium-albuterol  3 mL Nebulization Q6H  . ipratropium-albuterol  3 mL Nebulization Once  . methylPREDNISolone (SOLU-MEDROL) injection  80 mg Intravenous Q12H  . multivitamin with minerals  1 tablet Oral Daily  . rosuvastatin  10 mg Oral Daily  . sildenafil  20 mg Oral BID  . sodium chloride flush  3 mL Intravenous Q12H  . tamsulosin  0.4 mg Oral QPC breakfast  . temazepam  30 mg Oral QHS  . vitamin C  500 mg Oral BID   Continuous Infusions: . sodium chloride     PRN Meds:.sodium chloride, albuterol, sodium chloride flush  DVT prophylaxis: Eliquis Code Status: Full code Family Communication: no family at bedside  Disposition Plan: transfer to progressive  Consultants:   None   Procedures:   None   Antimicrobials:  None    Objective: Vitals:   12/14/18 0800 12/14/18 0839 12/14/18 0849 12/14/18 1120  BP:  127/66  137/64  Pulse: 89   87  Resp:  18  (!) 22  Temp:  98.3 F (36.8  C)  98.1 F (36.7 C)  TempSrc:  Oral  Oral  SpO2: 97% 98% 98% 98%  Weight:      Height:        Intake/Output Summary (Last 24 hours) at 12/14/2018 1338 Last data filed at 12/14/2018 1236 Gross per 24 hour  Intake 360 ml  Output 325 ml  Net 35 ml   Filed Weights   2019-01-05 1825 12/13/18 1241  Weight: 60.3 kg 57.8 kg    Examination:  Constitutional: No distress at rest Eyes: No scleral icterus ENMT: Moist mucous  membranes Neck: normal, supple Respiratory: Distant end expiratory wheezing, much improved, slightly tachypneic Cardiovascular: Regular rate and rhythm, no murmurs appreciated.  No peripheral edema Abdomen: Soft, nontender, nondistended, positive bowel sounds Musculoskeletal: no clubbing / cyanosis. Skin: No rashes seen Neurologic: Nonfocal, equal strength Psychiatric: Normal judgment and insight. Alert and oriented x 3. Normal mood.    Data Reviewed: I have independently reviewed following labs and imaging studies    CBC: Recent Labs  Lab 01-05-2019 1845 12/13/18 0415 12/14/18 0320  WBC 8.1 4.6 9.9  NEUTROABS 7.7 4.0  --   HGB 15.4 14.2 13.9  HCT 46.1 42.3 40.9  MCV 98.5 97.5 98.1  PLT 216 211 440   Basic Metabolic Panel: Recent Labs  Lab 01-05-19 1845 12/13/18 0415 12/14/18 0320  NA 139 138 137  K 4.3 4.1 4.4  CL 102 102 100  CO2 24 24 26   GLUCOSE 207* 136* 134*  BUN 20 20 25*  CREATININE 1.03 0.99 0.92  CALCIUM 9.4 9.1 9.1   GFR: Estimated Creatinine Clearance: 53.2 mL/min (by C-G formula based on SCr of 0.92 mg/dL). Liver Function Tests: Recent Labs  Lab 2019-01-05 1845  AST 21  ALT 30  ALKPHOS 63  BILITOT 0.7  PROT 6.8  ALBUMIN 4.1   No results for input(s): LIPASE, AMYLASE in the last 168 hours. No results for input(s): AMMONIA in the last 168 hours. Coagulation Profile: No results for input(s): INR, PROTIME in the last 168 hours. Cardiac Enzymes: No results for input(s): CKTOTAL, CKMB, CKMBINDEX, TROPONINI in the last 168 hours. BNP (last 3 results) Recent Labs    09/28/18 1242  PROBNP 23.0   HbA1C: No results for input(s): HGBA1C in the last 72 hours. CBG: No results for input(s): GLUCAP in the last 168 hours. Lipid Profile: No results for input(s): CHOL, HDL, LDLCALC, TRIG, CHOLHDL, LDLDIRECT in the last 72 hours. Thyroid Function Tests: No results for input(s): TSH, T4TOTAL, FREET4, T3FREE, THYROIDAB in the last 72 hours. Anemia  Panel: No results for input(s): VITAMINB12, FOLATE, FERRITIN, TIBC, IRON, RETICCTPCT in the last 72 hours. Urine analysis:    Component Value Date/Time   COLORURINE STRAW (A) 04/26/2018 0330   APPEARANCEUR CLEAR 04/26/2018 0330   LABSPEC 1.008 04/26/2018 0330   PHURINE 6.0 04/26/2018 0330   GLUCOSEU NEGATIVE 04/26/2018 0330   HGBUR NEGATIVE 04/26/2018 0330   BILIRUBINUR NEGATIVE 04/26/2018 0330   KETONESUR 5 (A) 04/26/2018 0330   PROTEINUR NEGATIVE 04/26/2018 0330   NITRITE NEGATIVE 04/26/2018 0330   LEUKOCYTESUR NEGATIVE 04/26/2018 0330   Sepsis Labs: Invalid input(s): PROCALCITONIN, LACTICIDVEN  No results found for this or any previous visit (from the past 240 hour(s)).    Radiology Studies: Ct Chest Wo Contrast  Result Date: 12/14/2018 CLINICAL DATA:  Chronic dyspnea.  Negative for nondiagnostic x-ray EXAM: CT CHEST WITHOUT CONTRAST TECHNIQUE: Multidetector CT imaging of the chest was performed following the standard protocol without IV contrast. COMPARISON:  Chest  x-ray from yesterday FINDINGS: Cardiovascular: No cardiomegaly or pericardial effusion. Aortic and coronary atherosclerotic calcification. Mediastinum/Nodes: Negative for adenopathy or mass. Lungs/Pleura: Bullous emphysema, panlobular. Occasional mucoid impaction in the lower lobe airways. There is no edema, consolidation, effusion, or pneumothorax. Somewhat flat irregular density at the right apex touching the pleura, scarring given shape and appearance on a 2016 radiograph. Upper Abdomen: Negative Musculoskeletal: No acute finding. Calcified disc extrusion at T7-8, likely contacting the cord. C7-T1 degenerative anterolisthesis. IMPRESSION: 1. Advanced emphysema. 2. Patchy mucoid impaction of bronchi. 3. Atherosclerosis Electronically Signed   By: Monte Fantasia M.D.   On: 12/14/2018 09:41   Dg Chest Port 1 View  Result Date: 12/13/2018 CLINICAL DATA:  Shortness of breath. Ex-smoker. Cough. EXAM: PORTABLE CHEST 1 VIEW  COMPARISON:  12/12/2018. FINDINGS: Normal sized heart. Tortuous aorta. Stable hyperexpansion of the lungs with extensive bullous changes. Surgical staple lines are again demonstrated on the right. No acute bony abnormality. IMPRESSION: No acute abnormality. Stable marked changes of COPD. Electronically Signed   By: Claudie Revering M.D.   On: 12/13/2018 10:55   Dg Chest Port 1 View  Result Date: 12/12/2018 CLINICAL DATA:  Shortness of breath. History of lung cancer and COPD. EXAM: PORTABLE CHEST 1 VIEW COMPARISON:  09/28/2018 FINDINGS: Cardiomediastinal silhouette is normal. Mediastinal contours appear intact. Tortuosity of the aorta. There is no evidence of focal airspace consolidation, pleural effusion or pneumothorax. Postsurgical changes in the right mid thorax. Severe upper lobe predominant emphysema. Osseous structures are without acute abnormality. Soft tissues are grossly normal. IMPRESSION: Severe upper lobe predominant emphysema. No evidence of acute lobar consolidation. Stable postsurgical changes in the right hemithorax. Electronically Signed   By: Fidela Salisbury M.D.   On: 12/12/2018 19:32   Marzetta Board, MD, PhD [facility]  Contact via  [email or web address]  [place] P: [phone]  F: [phone]

## 2018-12-14 NOTE — Progress Notes (Signed)
  Speech Language Pathology Treatment: Dysphagia  Patient Details Name: Andrew [name] MRN: [id number] DOB: 01/27/1941 Today's Date: 12/14/2018 Time: 2703-5009 SLP Time Calculation (min) (ACUTE ONLY): 12 min  Assessment / Plan / Recommendation Clinical Impression  Pt denies any further difficulties swallowing since the episode with the pills yesterday morning. He consumed regular textures and thin liquids with only one cough, which occurred while he was talking and eating at the same time. Education was reinforced about aspiration precautions, although given that pt's mild dysphagia is more chronic, he knows his precautions well. Would continue regular textures and thin liquids as tolerated. Could try pills one at a time whole, although pt said that this morning he preferred to have them crushed in puree. Will f/u briefly as he remains in house as his respiratory status is improved but not yet back to baseline per his report.   HPI HPI: Pt is a 78 yo male admitted with SOB due to COPD exacerbation (currently trying to be weaned from steroids by his pulmonologist). On 1/26 he had an episode in which he felt like he aspirated a pill, after which he had increased respiratory distress. MBS in 2018 recommended regular diet, thin liquids. He had mild pharyngeal residue that cleared well with additional swallows. PMH includes: sleep apnea, PNA, HTN, HLD, HH, GERD, emphysema      SLP Plan  Continue with current plan of care       Recommendations  Diet recommendations: Regular;Thin liquid Liquids provided via: Cup;Straw Medication Administration: Whole meds with liquid Supervision: Patient able to self feed;Intermittent supervision to cue for compensatory strategies Compensations: Slow rate;Small sips/bites Postural Changes and/or Swallow Maneuvers: Seated upright 90 degrees                Oral Care Recommendations: Oral care BID Follow up Recommendations: None SLP Visit Diagnosis: Dysphagia,  unspecified (R13.10) Plan: Continue with current plan of care       GO                Andrew [name] 12/14/2018, 1:50 PM  Nuala Alpha, M.A. [place] Acute Environmental education officer [phone] Office [phone]

## 2018-12-14 NOTE — Telephone Encounter (Signed)
Pt is calling back [phone]

## 2018-12-14 NOTE — Telephone Encounter (Signed)
Spoke with pt. States that he is currently hospitalized. He wanted to let Dr. Valeta Harms know that he would have to do his CT and PFT at a later date due to this. Nothing further was needed.

## 2018-12-14 NOTE — Evaluation (Signed)
Occupational Therapy Evaluation and Discharge Patient Details Name: Andrew [name] MRN: [id number] DOB: 1941-11-14 Today's Date: 12/14/2018    History of Present Illness Pt is a 78 year old man admitted 12/13/17 with SOB due to COPD. PMH: chronic 02 dependenc (2L), sleep apnea, PNA, GERD, emphysema, HH, HTN, pulmonary hypertension.   Clinical Impression   Pt is functioning modified independently and is knowledgeable in breathing and energy conservation strategies No OT needs.    Follow Up Recommendations  No OT follow up    Equipment Recommendations  None recommended by OT    Recommendations for Other Services       Precautions / Restrictions Precautions Precautions: Fall Precaution Comments: on home O2, 2 liters Restrictions Weight Bearing Restrictions: No      Mobility Bed Mobility Overal bed mobility: Independent                Transfers Overall transfer level: Modified independent Equipment used: None             General transfer comment: no physical assist required    Balance Overall balance assessment: Modified Independent                                         ADL either performed or assessed with clinical judgement   ADL Overall ADL's : Modified independent                                       General ADL Comments: pt is knowledgeable in energy conservation and pursed lip breathing     Vision Patient Visual Report: No change from baseline       Perception     Praxis      Pertinent Vitals/Pain Pain Assessment: No/denies pain     Hand Dominance Right   Extremity/Trunk Assessment Upper Extremity Assessment Upper Extremity Assessment: Overall WFL for tasks assessed   Lower Extremity Assessment Lower Extremity Assessment: Overall WFL for tasks assessed       Communication Communication Communication: HOH   Cognition Arousal/Alertness: Awake/alert Behavior During Therapy: WFL for tasks  assessed/performed Overall Cognitive Status: Within Functional Limits for tasks assessed                                     General Comments       Exercises     Shoulder Instructions      Home Living Family/patient expects to be discharged to:: Private residence Living Arrangements: Spouse/significant other;Children Available Help at Discharge: Family;Available 24 hours/day Type of Home: House Home Access: Level entry     Home Layout: One level     Bathroom Shower/Tub: Tub/shower unit         Home Equipment: Shower seat   Additional Comments: oxygen concentrator, CPAP      Prior Functioning/Environment Level of Independence: Independent        Comments: prior to admission in March went to [facility] 5-6x a week; recently started OP PT at [facility] location for general endurance/strength - wants to go to pulmonary rehab        OT Problem List: Decreased activity tolerance      OT Treatment/Interventions:      OT Goals(Current goals can be found in the  care plan section) Acute Rehab OT Goals Patient Stated Goal: to return home with his daughter's family  OT Frequency:     Barriers to D/C:            Co-evaluation              AM-PAC OT "6 Clicks" Daily Activity     Outcome Measure Help from another person eating meals?: None Help from another person taking care of personal grooming?: None Help from another person toileting, which includes using toliet, bedpan, or urinal?: None Help from another person bathing (including washing, rinsing, drying)?: None Help from another person to put on and taking off regular upper body clothing?: None Help from another person to put on and taking off regular lower body clothing?: None 6 Click Score: 24   End of Session Equipment Utilized During Treatment: Gait belt;Oxygen(2L)  Activity Tolerance: Patient tolerated treatment well Patient left: in chair;with call bell/phone within reach  OT Visit  Diagnosis: Other (comment)(decreased activity tolerance)                Time: 1601-0932 OT Time Calculation (min): 16 min Charges:  OT General Charges $OT Visit: 1 Visit OT Evaluation $OT Eval Moderate Complexity: 1 Mod  Nestor Lewandowsky, OTR/L Acute Rehabilitation Services Pager: [phone] Office: [phone]  Malka So 12/14/2018, 3:32 PM

## 2018-12-14 NOTE — Progress Notes (Signed)
Patient ambulated 252ft in the hall on 3L oxygen , ambulation well tolerated.

## 2018-12-14 NOTE — Telephone Encounter (Signed)
LMTCB x1 for pt.  

## 2018-12-14 NOTE — Evaluation (Signed)
Physical Therapy Evaluation Patient Details Name: Andrew [name] MRN: [id number] DOB: 1941/02/01 Today's Date: 12/14/2018   History of Present Illness  Pt is a 78 year old man admitted 12/13/17 with SOB due to COPD. PMH: chronic 02 dependenc (2L), sleep apnea, PNA, GERD, emphysema, HH, HTN, pulmonary hypertension.  Clinical Impression  Patient seen for therapy assessment.  Mobilizing well with no noted focal deficits at this time. Ambulated on 2 liters [place] with stable saturations and 2/4 DOE. Attempted room air with mobility, desaturation noted, returned to 2 liters. overall anticipate patient will be safe for d/c home. No further acute PT needs. Will sign off.'    Follow Up Recommendations No PT follow up    Equipment Recommendations  None recommended by PT    Recommendations for Other Services       Precautions / Restrictions Precautions Precautions: Fall Precaution Comments: on home O2, 2 liters Restrictions Weight Bearing Restrictions: No      Mobility  Bed Mobility Overal bed mobility: Independent                Transfers Overall transfer level: Modified independent Equipment used: (pushed Ot cart)             General transfer comment: no physical assist required  Ambulation/Gait Ambulation/Gait assistance: Modified independent (Device/Increase time) Gait Distance (Feet): 340 Feet Assistive device: (pushing O2 cart) Gait Pattern/deviations: WFL(Within Functional Limits) Gait velocity: decreased   General Gait Details: steady with ambulation  Stairs            Wheelchair Mobility    Modified Rankin (Stroke Patients Only)       Balance Overall balance assessment: Modified Independent                                           Pertinent Vitals/Pain Pain Assessment: No/denies pain    Home Living Family/patient expects to be discharged to:: Private residence Living Arrangements: Spouse/significant  other;Children Available Help at Discharge: Family;Available 24 hours/day Type of Home: House Home Access: Level entry     Home Layout: One level Home Equipment: Shower seat Additional Comments: oxygen concentrator, CPAP    Prior Function Level of Independence: Independent         Comments: prior to admission in March went to [facility] 5-6x a week; recently started OP PT at Neuro location for general endurance/strength - wants to go to pulmonary rehab     Hand Dominance   Dominant Hand: Right    Extremity/Trunk Assessment   Upper Extremity Assessment Upper Extremity Assessment: Overall WFL for tasks assessed    Lower Extremity Assessment Lower Extremity Assessment: Overall WFL for tasks assessed       Communication   Communication: HOH  Cognition Arousal/Alertness: Awake/alert Behavior During Therapy: WFL for tasks assessed/performed Overall Cognitive Status: Within Functional Limits for tasks assessed                                        General Comments      Exercises     Assessment/Plan    PT Assessment Patent does not need any further PT services  PT Problem List         PT Treatment Interventions      PT Goals (Current goals can be found in the Care  Plan section)  Acute Rehab PT Goals PT Goal Formulation: All assessment and education complete, DC therapy    Frequency     Barriers to discharge        Co-evaluation PT/OT/SLP Co-Evaluation/Treatment: (dove tailed session)             AM-PAC PT "6 Clicks" Mobility  Outcome Measure Help needed turning from your back to your side while in a flat bed without using bedrails?: None Help needed moving from lying on your back to sitting on the side of a flat bed without using bedrails?: None Help needed moving to and from a bed to a chair (including a wheelchair)?: None Help needed standing up from a chair using your arms (e.g., wheelchair or bedside chair)?: A Little Help needed  to walk in hospital room?: A Little Help needed climbing 3-5 steps with a railing? : A Little 6 Click Score: 21    End of Session Equipment Utilized During Treatment: Oxygen Activity Tolerance: Patient tolerated treatment well Patient left: in chair;with call bell/phone within reach Nurse Communication: Mobility status PT Visit Diagnosis: Unsteadiness on feet (R26.81)    Time: 1423-9532 PT Time Calculation (min) (ACUTE ONLY): 12 min   Charges:   PT Evaluation $PT Eval Low Complexity: [place], PT DPT  Board Certified Neurologic Specialist Acute Rehabilitation Services Pager [phone] Office [phone]   Duncan Dull 12/14/2018, 2:00 PM

## 2018-12-14 NOTE — Progress Notes (Signed)
Initial Nutrition Assessment  DOCUMENTATION CODES:   Not applicable  INTERVENTION:  Provide Ensure Enlive po once daily, each supplement provides 350 kcal and 20 grams of protein.  Encourage adequate PO intake.   NUTRITION DIAGNOSIS:   Increased nutrient needs related to chronic illness(COPD) as evidenced by estimated needs.  GOAL:   Patient will meet greater than or equal to 90% of their needs  MONITOR:   PO intake, Supplement acceptance, Labs, Weight trends, I & O's, Skin  REASON FOR ASSESSMENT:   Consult Assessment of nutrition requirement/status  ASSESSMENT:   78 year old male with history of two blebectomy, also right upper lobe and right middle lobe resection 2007, chronic hypoxic respiratory failure on 2 L oxygen at home, advanced COPD/emphysema came in with several weeks of progressive worsening dyspnea on exertion. He was found to be in respiratory distress in the ER and was admitted for COPD exacerbation  Meal completion has been 100%. Pt reports having a good appetite currently and PTA with usual consumption of at least 3 meals a day with no other difficulties. Usual body weight reported to be ~133-135 lbs. Question most recent recorded weight as admission weight 133 lbs. Pt knowledgeable on high protein diet to aid in increased nutrient needs. RD to order nutritional supplements to aid in adequate nutrition needs.   Labs and medications reviewed.   NUTRITION - FOCUSED PHYSICAL EXAM:  Depletion likely related to the natural aging process.     Most Recent Value  Orbital Region  Unable to assess  Upper Arm Region  No depletion  Thoracic and Lumbar Region  No depletion  Buccal Region  Moderate depletion  Temple Region  Moderate depletion  Clavicle Bone Region  Mild depletion  Clavicle and Acromion Bone Region  Mild depletion  Scapular Bone Region  Unable to assess  Dorsal Hand  Unable to assess  Patellar Region  Mild depletion  Anterior Thigh Region  Mild  depletion  Posterior Calf Region  Moderate depletion  Edema (RD Assessment)  None  Hair  Reviewed  Eyes  Reviewed  Mouth  Reviewed  Skin  Reviewed  Nails  Reviewed       Diet Order:   Diet Order            Diet Heart Room service appropriate? Yes; Fluid consistency: Thin  Diet effective now              EDUCATION NEEDS:   Not appropriate for education at this time  Skin:  Skin Assessment: Reviewed RN Assessment  Last BM:  1/26  Height:   Ht Readings from Last 1 Encounters:  12/13/18 5\' 7"  (1.702 m)    Weight:   Wt Readings from Last 1 Encounters:  12/13/18 57.8 kg    Ideal Body Weight:  67.2 kg  BMI:  Body mass index is 19.97 kg/m.  Estimated Nutritional Needs:   Kcal:  1700-1850  Protein:  80-90 grams  Fluid:  1.7 - 1.9 L/day    Corrin Parker, MS, RD, LDN Pager # [phone] After hours/ weekend pager # [phone]

## 2018-12-15 DIAGNOSIS — I480 Paroxysmal atrial fibrillation: Secondary | ICD-10-CM

## 2018-12-15 DIAGNOSIS — I10 Essential (primary) hypertension: Secondary | ICD-10-CM

## 2018-12-15 MED ORDER — PREDNISONE 10 MG PO TABS: 40.0000 mg | ORAL_TABLET | Freq: Every day | ORAL | 0 refills | Status: DC

## 2018-12-15 MED ORDER — IPRATROPIUM-ALBUTEROL 0.5-2.5 (3) MG/3ML IN SOLN: 3.0000 mL | Freq: Two times a day (BID) | RESPIRATORY_TRACT | Status: DC

## 2018-12-15 NOTE — Progress Notes (Signed)
Patient ambulated 258ft in the hall on 3LPM of oxygen, ambulation well tolerated.

## 2018-12-15 NOTE — Discharge Summary (Signed)
Physician Discharge Summary  Andrew [name] XFG:[id number] DOB: October 27, 1941 DOA: 12/12/2018  PCP: Lujean Amel, MD  Admit date: 12/12/2018 Discharge date: 12/15/2018  Admitted From: home Disposition:  home  Recommendations for Outpatient Follow-up:  1. Follow up with Dr. Valeta Harms in 1 week as scheduled 2. Please obtain BMP/CBC in one week as scheduled  Home Health: none Equipment/Devices: Cpap, home O2, nebulizer  Discharge Condition: stable CODE STATUS: Full code Diet recommendation: regular  HPI: Per admitting MD, Andrew [name] is a 78 y.o. 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  Patient reports no cough no fevers no chills he reports no chest pain lower extremity edema or swelling.  He recently had seen his urgent care was given some antibiotics and breathing treatments was sent home and told to come to the emergency department if he did not feel better.  He went home today and still was very short of breath with exertion despite his chronic 2 L at home.  Patient also reports that his pulmonologist with a [facility] here in [place] has been trying to wean him off of his chronic steroids.  He was chronically on prednisone 10 mg a day he was weaned down to 8 mg last week and is supposed to switch to 6 mg in a week or so.  Patient be referred for admission for COPD exacerbation.  Hospital Course: Principal Problem Acute on chronic hypoxic respiratory failure due to COPD exacerbation -patient was admitted to the hospital due to acute on chronic hypoxic respiratory failure in the setting of COPD exacerbation.  He has had progressive increase shortness of breath over the last several days with increased wheezing, not relieved by home  interventions and decided to come to the emergency room.  He was admitted to the hospital, placed on nebulizers, IV steroids and supportive treatment.  He had no fever, productive cough, flulike symptoms, leukocytosis and antibiotics were not initiated.  He underwent testing with respiratory virus panel which was negative.  His chest x-ray was without acute infiltrates.  Of note, patient did have an episode of acute respiratory distress on 1/26 AM as he aspirated 1 of his pills.  He recovered from this episode uneventfully.  Overall he has improved, he is able to ambulate in the hallway at his baseline.  Prior to admission his prednisone dose which is being tapered down.  He will be placed back up on a higher dose on discharge, and he will have a taper until he sees his pulmonologist in about a week.  He underwent CT scan of the chest in the hospital with advanced emphysema.  Active Problems Paroxysmal A. Fib -Sinus on telemetry, continue diltiazem and Eliquis Hyperlipidemia -Continue statin Pulmonary hypertension -Continue home medication with sildenafil Obstructive sleep apnea -Continue nightly CPAP   Discharge Diagnoses:  Principal Problem:   COPD exacerbation ([place]) Active Problems:   Chronic hypoxemic respiratory failure (HCC)   History of pulmonary hypertension   Hypertension   Paroxysmal atrial fibrillation (HCC)   Current chronic use of systemic steroids     Discharge Instructions   Allergies as of 12/15/2018   No Known Allergies     Medication List    STOP taking these medications   ADVAIR DISKUS 250-50 MCG/DOSE Aepb Generic drug:  Fluticasone-Salmeterol  TAKE these medications   AEROCHAMBER MV inhaler Use as instructed   albuterol 108 (90 Base) MCG/ACT inhaler Commonly known as:  PROVENTIL HFA;VENTOLIN HFA Inhale 2 puffs into the lungs every 6 (six) hours as needed for wheezing or shortness of breath.   apixaban 5 MG Tabs tablet Commonly known as:   ELIQUIS Take 1 tablet (5 mg total) by mouth 2 (two) times daily.   budesonide 0.5 MG/2ML nebulizer solution Commonly known as:  PULMICORT Take 2 mLs (0.5 mg total) by nebulization 2 (two) times daily.   budesonide-formoterol 160-4.5 MCG/ACT inhaler Commonly known as:  SYMBICORT Inhale 2 puffs into the lungs every 12 (twelve) hours. What changed:  Another medication with the same name was removed. Continue taking this medication, and follow the directions you see here.   diltiazem 180 MG 24 hr capsule Commonly known as:  CARDIZEM CD TAKE 1 CAPSULE EVERY DAY   finasteride 5 MG tablet Commonly known as:  PROSCAR Take 5 mg by mouth at bedtime.   fluticasone 50 MCG/ACT nasal spray Commonly known as:  FLONASE Place 1 spray into both nostrils at bedtime.   guaiFENesin 600 MG 12 hr tablet Commonly known as:  MUCINEX Take 1 tablet (600 mg total) by mouth 2 (two) times daily.   hydrocortisone cream 1 % Apply 1 application topically daily as needed (Eczema on face and head).   ipratropium-albuterol 0.5-2.5 (3) MG/3ML Soln Commonly known as:  DUONEB Take 3 mLs by nebulization 3 (three) times daily. What changed:  Another medication with the same name was removed. Continue taking this medication, and follow the directions you see here.   IRON PO Take 1 tablet by mouth 3 (three) times a week.   liver oil-zinc oxide 40 % ointment Commonly known as:  DESITIN Apply 1 application topically daily.   multivitamin with minerals tablet Take 1 tablet by mouth daily.   omeprazole 40 MG capsule Commonly known as:  PRILOSEC Take 40 mg by mouth 2 (two) times daily.   OXYGEN Inhale 2-3 L into the lungs See admin instructions. 2L daytime, 3L at night   polyethylene glycol packet Commonly known as:  MIRALAX / GLYCOLAX Take 17 g by mouth daily. What changed:    when to take this  additional instructions   predniSONE 10 MG tablet Commonly known as:  DELTASONE Take 4 tablets (40 mg  total) by mouth daily. 40 mg x 2 days then 30 mg x 2 days then 20 mg x 2 days then 10 mg until you see your Pulmonologist What changed:    how much to take  when to take this  additional instructions  Another medication with the same name was removed. Continue taking this medication, and follow the directions you see here.   rosuvastatin 10 MG tablet Commonly known as:  CRESTOR Take 10 mg by mouth daily.   sildenafil 20 MG tablet Commonly known as:  REVATIO Take 1 tablet (20 mg total) by mouth 2 (two) times daily.   SPIRIVA HANDIHALER 18 MCG inhalation capsule Generic drug:  tiotropium INHALE THE CONTENTS OF 1 CAPSULE EVERY DAY What changed:    how much to take  how to take this  when to take this  additional instructions  Another medication with the same name was removed. Continue taking this medication, and follow the directions you see here.   tamsulosin 0.4 MG Caps capsule Commonly known as:  FLOMAX Take 1 capsule (0.4 mg total) by mouth daily after breakfast.  temazepam 30 MG capsule Commonly known as:  RESTORIL Take 1 capsule (30 mg total) by mouth at bedtime.   vitamin C 500 MG tablet Commonly known as:  ASCORBIC ACID Take 500 mg by mouth 2 (two) times daily.   VITAMIN D PO Take 1 tablet by mouth daily.      Follow-up Information    Icard, Bradley L, DO Follow up.   Specialty:  Pulmonary Disease Why:  as scheduled Contact information: [place] [address] [phone]           Consultations:  None   Procedures/Studies:  Ct Chest Wo Contrast  Result Date: 12/14/2018 CLINICAL DATA:  Chronic dyspnea.  Negative for nondiagnostic x-ray EXAM: CT CHEST WITHOUT CONTRAST TECHNIQUE: Multidetector CT imaging of the chest was performed following the standard protocol without IV contrast. COMPARISON:  Chest x-ray from yesterday FINDINGS: Cardiovascular: No cardiomegaly or pericardial effusion. Aortic and coronary  atherosclerotic calcification. Mediastinum/Nodes: Negative for adenopathy or mass. Lungs/Pleura: Bullous emphysema, panlobular. Occasional mucoid impaction in the lower lobe airways. There is no edema, consolidation, effusion, or pneumothorax. Somewhat flat irregular density at the right apex touching the pleura, scarring given shape and appearance on a 2016 radiograph. Upper Abdomen: Negative Musculoskeletal: No acute finding. Calcified disc extrusion at T7-8, likely contacting the cord. C7-T1 degenerative anterolisthesis. IMPRESSION: 1. Advanced emphysema. 2. Patchy mucoid impaction of bronchi. 3. Atherosclerosis Electronically Signed   By: Monte Fantasia M.D.   On: 12/14/2018 09:41   Dg Chest Port 1 View  Result Date: 12/13/2018 CLINICAL DATA:  Shortness of breath. Ex-smoker. Cough. EXAM: PORTABLE CHEST 1 VIEW COMPARISON:  12/12/2018. FINDINGS: Normal sized heart. Tortuous aorta. Stable hyperexpansion of the lungs with extensive bullous changes. Surgical staple lines are again demonstrated on the right. No acute bony abnormality. IMPRESSION: No acute abnormality. Stable marked changes of COPD. Electronically Signed   By: Claudie Revering M.D.   On: 12/13/2018 10:55   Dg Chest Port 1 View  Result Date: 12/12/2018 CLINICAL DATA:  Shortness of breath. History of lung cancer and COPD. EXAM: PORTABLE CHEST 1 VIEW COMPARISON:  09/28/2018 FINDINGS: Cardiomediastinal silhouette is normal. Mediastinal contours appear intact. Tortuosity of the aorta. There is no evidence of focal airspace consolidation, pleural effusion or pneumothorax. Postsurgical changes in the right mid thorax. Severe upper lobe predominant emphysema. Osseous structures are without acute abnormality. Soft tissues are grossly normal. IMPRESSION: Severe upper lobe predominant emphysema. No evidence of acute lobar consolidation. Stable postsurgical changes in the right hemithorax. Electronically Signed   By: Fidela Salisbury M.D.   On: 12/12/2018  19:32      Subjective: - no chest pain, shortness of breath, no abdominal pain, nausea or vomiting.   Discharge Exam: Vitals:   12/15/18 0734 12/15/18 0856  BP: (!) 142/69   Pulse: 84   Resp: 20   Temp: 98.1 F (36.7 C)   SpO2: 100% 98%    General: Pt is alert, awake, not in acute distress Cardiovascular: RRR, S1/S2 +, no rubs, no gallops Respiratory: CTA bilaterally, no wheezing, no rhonchi Abdominal: Soft, NT, ND, bowel sounds + Extremities: no edema, no cyanosis    The results of significant diagnostics from this hospitalization (including imaging, microbiology, ancillary and laboratory) are listed below for reference.     Microbiology: Recent Results (from the past 240 hour(s))  Respiratory Panel by PCR     Status: None   Collection Time: 12/14/18  1:00 PM  Result Value Ref Range Status  Adenovirus NOT DETECTED NOT DETECTED Final   Coronavirus 229E NOT DETECTED NOT DETECTED Final   Coronavirus HKU1 NOT DETECTED NOT DETECTED Final   Coronavirus NL63 NOT DETECTED NOT DETECTED Final   Coronavirus OC43 NOT DETECTED NOT DETECTED Final   Metapneumovirus NOT DETECTED NOT DETECTED Final   Rhinovirus / Enterovirus NOT DETECTED NOT DETECTED Final   Influenza A NOT DETECTED NOT DETECTED Final   Influenza B NOT DETECTED NOT DETECTED Final   Parainfluenza Virus 1 NOT DETECTED NOT DETECTED Final   Parainfluenza Virus 2 NOT DETECTED NOT DETECTED Final   Parainfluenza Virus 3 NOT DETECTED NOT DETECTED Final   Parainfluenza Virus 4 NOT DETECTED NOT DETECTED Final   Respiratory Syncytial Virus NOT DETECTED NOT DETECTED Final   Bordetella pertussis NOT DETECTED NOT DETECTED Final   Chlamydophila pneumoniae NOT DETECTED NOT DETECTED Final   Mycoplasma pneumoniae NOT DETECTED NOT DETECTED Final    Comment: Performed at [facility], [place] [address] [place], [place] [address]     Labs: BNP (last 3 results) No results for input(s): BNP in the last 8760 hours. Basic  Metabolic Panel: Recent Labs  Lab 12/12/18 1845 12/13/18 0415 12/14/18 0320  NA 139 138 137  K 4.3 4.1 4.4  CL 102 102 100  CO2 24 24 26   GLUCOSE 207* 136* 134*  BUN 20 20 25*  CREATININE 1.03 0.99 0.92  CALCIUM 9.4 9.1 9.1   Liver Function Tests: Recent Labs  Lab 12/12/18 1845  AST 21  ALT 30  ALKPHOS 63  BILITOT 0.7  PROT 6.8  ALBUMIN 4.1   No results for input(s): LIPASE, AMYLASE in the last 168 hours. No results for input(s): AMMONIA in the last 168 hours. CBC: Recent Labs  Lab 12/12/18 1845 12/13/18 0415 12/14/18 0320  WBC 8.1 4.6 9.9  NEUTROABS 7.7 4.0  --   HGB 15.4 14.2 13.9  HCT 46.1 42.3 40.9  MCV 98.5 97.5 98.1  PLT 216 211 215   Cardiac Enzymes: No results for input(s): CKTOTAL, CKMB, CKMBINDEX, TROPONINI in the last 168 hours. BNP: Invalid input(s): POCBNP CBG: No results for input(s): GLUCAP in the last 168 hours. D-Dimer No results for input(s): DDIMER in the last 72 hours. Hgb A1c No results for input(s): HGBA1C in the last 72 hours. Lipid Profile No results for input(s): CHOL, HDL, LDLCALC, TRIG, CHOLHDL, LDLDIRECT in the last 72 hours. Thyroid function studies No results for input(s): TSH, T4TOTAL, T3FREE, THYROIDAB in the last 72 hours.  Invalid input(s): FREET3 Anemia work up No results for input(s): VITAMINB12, FOLATE, FERRITIN, TIBC, IRON, RETICCTPCT in the last 72 hours. Urinalysis    Component Value Date/Time   COLORURINE STRAW (A) 04/26/2018 0330   APPEARANCEUR CLEAR 04/26/2018 0330   LABSPEC 1.008 04/26/2018 0330   PHURINE 6.0 04/26/2018 0330   GLUCOSEU NEGATIVE 04/26/2018 0330   HGBUR NEGATIVE 04/26/2018 0330   BILIRUBINUR NEGATIVE 04/26/2018 0330   KETONESUR 5 (A) 04/26/2018 0330   PROTEINUR NEGATIVE 04/26/2018 0330   NITRITE NEGATIVE 04/26/2018 0330   LEUKOCYTESUR NEGATIVE 04/26/2018 0330   Sepsis Labs Invalid input(s): PROCALCITONIN,  WBC,  LACTICIDVEN   Time coordinating discharge: 45  minutes  SIGNED:  Marzetta Board, MD  [facility] 12/15/2018, 11:29 AM

## 2018-12-15 NOTE — Progress Notes (Addendum)
Patient in a stable condition, discharge education reviewed with patient he verbalized understanding, iv removed, tele dc, ccmd notified, prescription given to patient, patient belongings at bedside, patient to be transported home by his family.

## 2018-12-16 ENCOUNTER — Telehealth: Payer: Self-pay | Admitting: Pulmonary Disease

## 2018-12-16 NOTE — Telephone Encounter (Signed)
Call made to [place], [place] initiated. Will take 24-72 hours for a decision to be made. Will route message to myself to F/U.   Ref # [id number].

## 2018-12-16 NOTE — Telephone Encounter (Signed)
Call made to [facility] was placed on hold for 20 minutes. Will attempt to call back later.

## 2018-12-16 NOTE — Telephone Encounter (Signed)
Pt called back, states that [place] states that his Pulmicort is not covered by insurance.  He was not advised of any covered alternatives.  I do not see in chart where a dx code was put on prescription, which can sometimes make this rx not covered.  We need to call [employer] to see if a PA is needed, or if this is covered under Medicare part B with a dx code.  I cannot call [employer] myself as I am up front working phones this morning.    Routing back to triage for follow-up.

## 2018-12-18 MED ORDER — BUDESONIDE 0.5 MG/2ML IN SUSP: 0.5000 mg | Freq: Two times a day (BID) | RESPIRATORY_TRACT | 0 refills | Status: DC

## 2018-12-18 NOTE — Consult Note (Signed)
            [facility] CM Primary Care Navigator  12/18/2018  Lennox Dolberry August 19, 1941 [id number]   Attempt to see patient at the bedside to identify possible discharge needs buthewasalready dischargedhome.  Per MD note,patientpresentedwith progressive worsening dyspnea on exertion inspite home oxygen use. He was recently seen at the Urgent Care was given some antibiotics and breathing treatments, was sent home and noted no improvement. He was admitted and treated for COPD exacerbation.(acute on chronic hypoxic respiratory failure due to COPD exacerbation)  Patient has discharge instruction tofollow-up withpulmonary disease (Dr. Valeta Harms- [place]) in 1 week post discharge.  Primary care provider's office is listed as providing transition of care (TOC) follow-up.   For additional questions please contact:  Edwena Felty A. Milind Raether, BSN, RN-BC [facility] PRIMARY CARE Navigator Cell: [phone]

## 2018-12-18 NOTE — Telephone Encounter (Signed)
Claim has been denied per [facility]. Called and spoke to pharmacist Josue Hector. It was denied because this patient does not have part B coverage with them. He only has part D but his plan is for drugs/medication only. Nebulizer medications are billed under part B. Per Josue Hector we will need to send to local pharmacy and have them bill under part B. Order sent to local pharmacy with note to bill under part B. Will close this message. Confirmed with pharmacy they have received order. They will contact us if anything further is needed.

## 2018-12-18 NOTE — Telephone Encounter (Signed)
Call made to patient to make aware. Nothing further is needed at this time.

## 2018-12-21 ENCOUNTER — Telehealth: Payer: Self-pay | Admitting: Pulmonary Disease

## 2018-12-21 DIAGNOSIS — J9611 Chronic respiratory failure with hypoxia: Secondary | ICD-10-CM

## 2018-12-21 NOTE — Telephone Encounter (Signed)
PCCM:  Yes, ok to prescribe.   [facility] 12/21/2018 5:47 PM

## 2018-12-21 NOTE — Telephone Encounter (Signed)
Spoke with pt, requesting an order for a "jet" nebulizer.  States that his is an "ultrasonic" nebulizer and is not compatible with Budesonide.  Pt uses [facility]- states that they can provide pt with a compatible nebulizer with an order from our office.  Dr. Valeta Harms- ok to order new nebulizer machine for pt?  Thanks!

## 2018-12-22 NOTE — Telephone Encounter (Signed)
Called patient, unable to reach. Left message to give [place] a call back. Order has been sent.

## 2018-12-22 NOTE — Telephone Encounter (Signed)
Patient returning phone call.  Patient phone number is [phone].

## 2018-12-22 NOTE — Telephone Encounter (Signed)
Called and spoke with patient he is aware that we have sent in the order for the Jet nebulizer. Nothing further needed.

## 2018-12-23 ENCOUNTER — Telehealth: Payer: Self-pay

## 2018-12-23 NOTE — Telephone Encounter (Signed)
Appt rescheduled with PFT. Order for Chest placed. Patient aware someone would be calling to schedule Chest CT. Nothing further is needed at this time.   Will route message to [facility] as this needs to be scheduled by 01/07/19 for OV. Nothing further is needed at this time.

## 2018-12-23 NOTE — Telephone Encounter (Signed)
-----   Message from Garner Nash, DO sent at 12/03/2018  6:36 PM EST ----- [place],  I added orders for full PFTs as well as a noncontrasted CT of the chest.  I like to see if he can have this completed prior to the next office visit.  PFTs could be completed same day if we can get this scheduled.   Thanks Leory Plowman

## 2018-12-24 ENCOUNTER — Ambulatory Visit: Payer: Medicare Other | Admitting: Pulmonary Disease

## 2018-12-25 DIAGNOSIS — N400 Enlarged prostate without lower urinary tract symptoms: Secondary | ICD-10-CM | POA: Diagnosis not present

## 2018-12-25 DIAGNOSIS — Z0001 Encounter for general adult medical examination with abnormal findings: Secondary | ICD-10-CM | POA: Diagnosis not present

## 2018-12-25 DIAGNOSIS — E440 Moderate protein-calorie malnutrition: Secondary | ICD-10-CM | POA: Diagnosis not present

## 2018-12-25 DIAGNOSIS — E041 Nontoxic single thyroid nodule: Secondary | ICD-10-CM | POA: Diagnosis not present

## 2018-12-25 DIAGNOSIS — Z79899 Other long term (current) drug therapy: Secondary | ICD-10-CM | POA: Diagnosis not present

## 2018-12-25 DIAGNOSIS — I482 Chronic atrial fibrillation, unspecified: Secondary | ICD-10-CM | POA: Diagnosis not present

## 2018-12-25 DIAGNOSIS — J9611 Chronic respiratory failure with hypoxia: Secondary | ICD-10-CM | POA: Diagnosis not present

## 2018-12-25 DIAGNOSIS — B370 Candidal stomatitis: Secondary | ICD-10-CM | POA: Diagnosis not present

## 2018-12-25 DIAGNOSIS — I10 Essential (primary) hypertension: Secondary | ICD-10-CM | POA: Diagnosis not present

## 2018-12-25 DIAGNOSIS — D509 Iron deficiency anemia, unspecified: Secondary | ICD-10-CM | POA: Diagnosis not present

## 2018-12-25 DIAGNOSIS — E7800 Pure hypercholesterolemia, unspecified: Secondary | ICD-10-CM | POA: Diagnosis not present

## 2018-12-25 DIAGNOSIS — J439 Emphysema, unspecified: Secondary | ICD-10-CM | POA: Diagnosis not present

## 2018-12-26 DIAGNOSIS — R0602 Shortness of breath: Secondary | ICD-10-CM | POA: Diagnosis not present

## 2018-12-26 DIAGNOSIS — J9601 Acute respiratory failure with hypoxia: Secondary | ICD-10-CM | POA: Diagnosis not present

## 2018-12-26 DIAGNOSIS — I10 Essential (primary) hypertension: Secondary | ICD-10-CM | POA: Diagnosis not present

## 2018-12-26 DIAGNOSIS — R069 Unspecified abnormalities of breathing: Secondary | ICD-10-CM | POA: Diagnosis not present

## 2018-12-26 DIAGNOSIS — J441 Chronic obstructive pulmonary disease with (acute) exacerbation: Secondary | ICD-10-CM | POA: Diagnosis not present

## 2018-12-27 ENCOUNTER — Encounter (HOSPITAL_COMMUNITY): Payer: Self-pay | Admitting: Oncology

## 2018-12-27 ENCOUNTER — Emergency Department (HOSPITAL_COMMUNITY): Payer: Medicare Other

## 2018-12-27 ENCOUNTER — Other Ambulatory Visit: Payer: Self-pay

## 2018-12-27 ENCOUNTER — Inpatient Hospital Stay (HOSPITAL_COMMUNITY)
Admission: EM | Admit: 2018-12-27 | Discharge: 2018-12-30 | DRG: 190 | Disposition: A | Discharge status: Home Health | Payer: Medicare Other | Attending: Internal Medicine | Admitting: Internal Medicine

## 2018-12-27 DIAGNOSIS — J449 Chronic obstructive pulmonary disease, unspecified: Secondary | ICD-10-CM | POA: Diagnosis present

## 2018-12-27 DIAGNOSIS — J9621 Acute and chronic respiratory failure with hypoxia: Secondary | ICD-10-CM | POA: Diagnosis not present

## 2018-12-27 DIAGNOSIS — Z9989 Dependence on other enabling machines and devices: Secondary | ICD-10-CM

## 2018-12-27 DIAGNOSIS — H9191 Unspecified hearing loss, right ear: Secondary | ICD-10-CM | POA: Diagnosis not present

## 2018-12-27 DIAGNOSIS — Z85118 Personal history of other malignant neoplasm of bronchus and lung: Secondary | ICD-10-CM

## 2018-12-27 DIAGNOSIS — Z8679 Personal history of other diseases of the circulatory system: Secondary | ICD-10-CM

## 2018-12-27 DIAGNOSIS — J9611 Chronic respiratory failure with hypoxia: Secondary | ICD-10-CM | POA: Diagnosis present

## 2018-12-27 DIAGNOSIS — Z7952 Long term (current) use of systemic steroids: Secondary | ICD-10-CM

## 2018-12-27 DIAGNOSIS — R739 Hyperglycemia, unspecified: Secondary | ICD-10-CM | POA: Diagnosis present

## 2018-12-27 DIAGNOSIS — Z9981 Dependence on supplemental oxygen: Secondary | ICD-10-CM

## 2018-12-27 DIAGNOSIS — R140 Abdominal distension (gaseous): Secondary | ICD-10-CM | POA: Diagnosis not present

## 2018-12-27 DIAGNOSIS — N400 Enlarged prostate without lower urinary tract symptoms: Secondary | ICD-10-CM | POA: Diagnosis present

## 2018-12-27 DIAGNOSIS — I10 Essential (primary) hypertension: Secondary | ICD-10-CM | POA: Diagnosis present

## 2018-12-27 DIAGNOSIS — J9601 Acute respiratory failure with hypoxia: Secondary | ICD-10-CM | POA: Diagnosis not present

## 2018-12-27 DIAGNOSIS — R0602 Shortness of breath: Secondary | ICD-10-CM | POA: Diagnosis not present

## 2018-12-27 DIAGNOSIS — K219 Gastro-esophageal reflux disease without esophagitis: Secondary | ICD-10-CM | POA: Diagnosis present

## 2018-12-27 DIAGNOSIS — I480 Paroxysmal atrial fibrillation: Secondary | ICD-10-CM | POA: Diagnosis present

## 2018-12-27 DIAGNOSIS — G4733 Obstructive sleep apnea (adult) (pediatric): Secondary | ICD-10-CM | POA: Diagnosis not present

## 2018-12-27 DIAGNOSIS — J441 Chronic obstructive pulmonary disease with (acute) exacerbation: Secondary | ICD-10-CM | POA: Diagnosis not present

## 2018-12-27 DIAGNOSIS — I272 Pulmonary hypertension, unspecified: Secondary | ICD-10-CM | POA: Diagnosis present

## 2018-12-27 DIAGNOSIS — Z79899 Other long term (current) drug therapy: Secondary | ICD-10-CM

## 2018-12-27 DIAGNOSIS — Z7901 Long term (current) use of anticoagulants: Secondary | ICD-10-CM

## 2018-12-27 DIAGNOSIS — T380X5A Adverse effect of glucocorticoids and synthetic analogues, initial encounter: Secondary | ICD-10-CM | POA: Diagnosis present

## 2018-12-27 DIAGNOSIS — E785 Hyperlipidemia, unspecified: Secondary | ICD-10-CM | POA: Diagnosis present

## 2018-12-27 DIAGNOSIS — Z8249 Family history of ischemic heart disease and other diseases of the circulatory system: Secondary | ICD-10-CM

## 2018-12-27 DIAGNOSIS — Z7951 Long term (current) use of inhaled steroids: Secondary | ICD-10-CM

## 2018-12-27 DIAGNOSIS — Z87891 Personal history of nicotine dependence: Secondary | ICD-10-CM

## 2018-12-27 LAB — CBC WITH DIFFERENTIAL/PLATELET
Abs Immature Granulocytes: 0.15 10*3/uL — ABNORMAL HIGH (ref 0.00–0.07)
Basophils Absolute: 0 10*3/uL (ref 0.0–0.1)
Basophils Relative: 0 %
Eosinophils Absolute: 0 10*3/uL (ref 0.0–0.5)
Eosinophils Relative: 0 %
HCT: 44.5 % (ref 39.0–52.0)
Hemoglobin: 14.4 g/dL (ref 13.0–17.0)
Immature Granulocytes: 1 %
Lymphocytes Relative: 1 %
Lymphs Abs: 0.3 10*3/uL — ABNORMAL LOW (ref 0.7–4.0)
MCH: 32.8 pg (ref 26.0–34.0)
MCHC: 32.4 g/dL (ref 30.0–36.0)
MCV: 101.4 fL — ABNORMAL HIGH (ref 80.0–100.0)
MONO ABS: 0.4 10*3/uL (ref 0.1–1.0)
Monocytes Relative: 2 %
Neutro Abs: 20.5 10*3/uL — ABNORMAL HIGH (ref 1.7–7.7)
Neutrophils Relative %: 96 %
Platelets: 214 10*3/uL (ref 150–400)
RBC: 4.39 MIL/uL (ref 4.22–5.81)
RDW: 12.7 % (ref 11.5–15.5)
WBC: 21.3 10*3/uL — ABNORMAL HIGH (ref 4.0–10.5)
nRBC: 0 % (ref 0.0–0.2)

## 2018-12-27 LAB — POCT I-STAT EG7
Acid-Base Excess: 2 mmol/L (ref 0.0–2.0)
Bicarbonate: 27.4 mmol/L (ref 20.0–28.0)
Calcium, Ion: 1.13 mmol/L — ABNORMAL LOW (ref 1.15–1.40)
HCT: 42 % (ref 39.0–52.0)
HEMOGLOBIN: 14.3 g/dL (ref 13.0–17.0)
O2 Saturation: 67 %
POTASSIUM: 4.8 mmol/L (ref 3.5–5.1)
Patient temperature: 37
Sodium: 139 mmol/L (ref 135–145)
TCO2: 29 mmol/L (ref 22–32)
pCO2, Ven: 42.8 mmHg — ABNORMAL LOW (ref 44.0–60.0)
pH, Ven: 7.414 (ref 7.250–7.430)
pO2, Ven: 34 mmHg (ref 32.0–45.0)

## 2018-12-27 LAB — COMPREHENSIVE METABOLIC PANEL
ALBUMIN: 3.6 g/dL (ref 3.5–5.0)
ALT: 42 U/L (ref 0–44)
AST: 22 U/L (ref 15–41)
Alkaline Phosphatase: 57 U/L (ref 38–126)
Anion gap: 11 (ref 5–15)
BUN: 24 mg/dL — ABNORMAL HIGH (ref 8–23)
CALCIUM: 9 mg/dL (ref 8.9–10.3)
CO2: 25 mmol/L (ref 22–32)
Chloride: 105 mmol/L (ref 98–111)
Creatinine, Ser: 0.88 mg/dL (ref 0.61–1.24)
GFR calc Af Amer: >60 mL/min (ref >60)
GFR calc non Af Amer: >60 mL/min (ref >60)
Glucose, Bld: 117 mg/dL — ABNORMAL HIGH (ref 70–99)
Potassium: 4.8 mmol/L (ref 3.5–5.1)
Sodium: 141 mmol/L (ref 135–145)
Total Bilirubin: 0.7 mg/dL (ref 0.3–1.2)
Total Protein: 6 g/dL — ABNORMAL LOW (ref 6.5–8.1)

## 2018-12-27 LAB — TROPONIN I: Troponin I: <0.03 ng/mL (ref <0.03)

## 2018-12-27 LAB — BRAIN NATRIURETIC PEPTIDE: B Natriuretic Peptide: 51.4 pg/mL (ref 0.0–100.0)

## 2018-12-27 LAB — HIV ANTIBODY (ROUTINE TESTING W REFLEX): HIV Screen 4th Generation wRfx: NONREACTIVE

## 2018-12-27 MED ORDER — MOMETASONE FURO-FORMOTEROL FUM 200-5 MCG/ACT IN AERO
2.0000 | INHALATION_SPRAY | Freq: Two times a day (BID) | RESPIRATORY_TRACT | Status: DC
  Filled 2018-12-27: qty 8.8

## 2018-12-27 MED ORDER — PREDNISONE 20 MG PO TABS: 40.0000 mg | ORAL_TABLET | Freq: Every day | ORAL | Status: DC

## 2018-12-27 MED ORDER — TAMSULOSIN HCL 0.4 MG PO CAPS
0.4000 mg | ORAL_CAPSULE | Freq: Every day | ORAL | Status: DC
  Administered 2018-12-27 – 2018-12-30 (×4): 0.4 mg via ORAL
  Filled 2018-12-27 (×4): qty 1

## 2018-12-27 MED ORDER — ALBUTEROL (5 MG/ML) CONTINUOUS INHALATION SOLN
15.0000 mg/h | INHALATION_SOLUTION | RESPIRATORY_TRACT | Status: DC
  Administered 2018-12-27: 15 mg/h via RESPIRATORY_TRACT
  Filled 2018-12-27: qty 20

## 2018-12-27 MED ORDER — NYSTATIN 100000 UNIT/ML MT SUSP
5.0000 mL | Freq: Four times a day (QID) | OROMUCOSAL | Status: DC
  Administered 2018-12-27 – 2018-12-30 (×13): 500000 [IU] via ORAL
  Filled 2018-12-27 (×13): qty 5

## 2018-12-27 MED ORDER — POLYETHYLENE GLYCOL 3350 17 G PO PACK
17.0000 g | PACK | Freq: Every day | ORAL | Status: DC
  Administered 2018-12-27 – 2018-12-29 (×3): 17 g via ORAL
  Filled 2018-12-27 (×3): qty 1

## 2018-12-27 MED ORDER — UMECLIDINIUM BROMIDE 62.5 MCG/INH IN AEPB
1.0000 | INHALATION_SPRAY | Freq: Every day | RESPIRATORY_TRACT | Status: DC
  Filled 2018-12-27: qty 7

## 2018-12-27 MED ORDER — SODIUM CHLORIDE 0.9 % IV SOLN
1.0000 g | Freq: Three times a day (TID) | INTRAVENOUS | Status: DC
  Administered 2018-12-27: 1 g via INTRAVENOUS
  Filled 2018-12-27 (×2): qty 1

## 2018-12-27 MED ORDER — FINASTERIDE 5 MG PO TABS
5.0000 mg | ORAL_TABLET | Freq: Every day | ORAL | Status: DC
  Administered 2018-12-27 – 2018-12-29 (×3): 5 mg via ORAL
  Filled 2018-12-27 (×3): qty 1

## 2018-12-27 MED ORDER — IPRATROPIUM-ALBUTEROL 0.5-2.5 (3) MG/3ML IN SOLN
3.0000 mL | Freq: Four times a day (QID) | RESPIRATORY_TRACT | Status: DC
  Administered 2018-12-27 (×3): 3 mL via RESPIRATORY_TRACT
  Filled 2018-12-27 (×3): qty 3

## 2018-12-27 MED ORDER — LEVOFLOXACIN IN D5W 750 MG/150ML IV SOLN
750.0000 mg | Freq: Once | INTRAVENOUS | Status: AC
  Administered 2018-12-27: 750 mg via INTRAVENOUS
  Filled 2018-12-27: qty 150

## 2018-12-27 MED ORDER — DILTIAZEM HCL ER COATED BEADS 180 MG PO CP24
180.0000 mg | ORAL_CAPSULE | Freq: Every day | ORAL | Status: DC
  Administered 2018-12-27 – 2018-12-30 (×4): 180 mg via ORAL
  Filled 2018-12-27 (×4): qty 1

## 2018-12-27 MED ORDER — GUAIFENESIN ER 600 MG PO TB12
600.0000 mg | ORAL_TABLET | Freq: Two times a day (BID) | ORAL | Status: DC
  Administered 2018-12-27 – 2018-12-30 (×7): 600 mg via ORAL
  Filled 2018-12-27 (×7): qty 1

## 2018-12-27 MED ORDER — APIXABAN 5 MG PO TABS
5.0000 mg | ORAL_TABLET | Freq: Two times a day (BID) | ORAL | Status: DC
  Administered 2018-12-27 – 2018-12-30 (×7): 5 mg via ORAL
  Filled 2018-12-27 (×7): qty 1

## 2018-12-27 MED ORDER — ALBUTEROL SULFATE (2.5 MG/3ML) 0.083% IN NEBU: 2.5000 mg | INHALATION_SOLUTION | RESPIRATORY_TRACT | Status: DC | PRN

## 2018-12-27 MED ORDER — SODIUM CHLORIDE 0.9 % IV SOLN
1.0000 g | Freq: Three times a day (TID) | INTRAVENOUS | Status: DC
  Administered 2018-12-27 – 2018-12-29 (×6): 1 g via INTRAVENOUS
  Filled 2018-12-27 (×7): qty 1

## 2018-12-27 MED ORDER — TEMAZEPAM 15 MG PO CAPS
30.0000 mg | ORAL_CAPSULE | Freq: Every day | ORAL | Status: DC
  Administered 2018-12-27 – 2018-12-29 (×3): 30 mg via ORAL
  Filled 2018-12-27 (×3): qty 2

## 2018-12-27 MED ORDER — SILDENAFIL CITRATE 20 MG PO TABS
20.0000 mg | ORAL_TABLET | Freq: Two times a day (BID) | ORAL | Status: DC
  Administered 2018-12-27 – 2018-12-30 (×7): 20 mg via ORAL
  Filled 2018-12-27 (×7): qty 1

## 2018-12-27 MED ORDER — PANTOPRAZOLE SODIUM 40 MG PO TBEC
80.0000 mg | DELAYED_RELEASE_TABLET | Freq: Two times a day (BID) | ORAL | Status: DC
  Administered 2018-12-27 – 2018-12-30 (×7): 80 mg via ORAL
  Filled 2018-12-27 (×7): qty 2

## 2018-12-27 MED ORDER — ROSUVASTATIN CALCIUM 5 MG PO TABS
10.0000 mg | ORAL_TABLET | Freq: Every day | ORAL | Status: DC
  Administered 2018-12-27 – 2018-12-30 (×4): 10 mg via ORAL
  Filled 2018-12-27 (×4): qty 2

## 2018-12-27 MED ORDER — METHYLPREDNISOLONE SODIUM SUCC 125 MG IJ SOLR
60.0000 mg | Freq: Two times a day (BID) | INTRAMUSCULAR | Status: DC
  Administered 2018-12-27 – 2018-12-28 (×3): 60 mg via INTRAVENOUS
  Filled 2018-12-27 (×3): qty 2

## 2018-12-27 MED ORDER — IPRATROPIUM-ALBUTEROL 0.5-2.5 (3) MG/3ML IN SOLN
3.0000 mL | Freq: Three times a day (TID) | RESPIRATORY_TRACT | Status: DC
  Administered 2018-12-28 – 2018-12-30 (×7): 3 mL via RESPIRATORY_TRACT
  Filled 2018-12-27 (×9): qty 3

## 2018-12-27 MED ORDER — ORAL CARE MOUTH RINSE
15.0000 mL | Freq: Two times a day (BID) | OROMUCOSAL | Status: DC
  Administered 2018-12-27 – 2018-12-30 (×7): 15 mL via OROMUCOSAL

## 2018-12-27 MED ORDER — FLUTICASONE PROPIONATE 50 MCG/ACT NA SUSP
1.0000 | Freq: Every day | NASAL | Status: DC
  Administered 2018-12-27 – 2018-12-29 (×3): 1 via NASAL
  Filled 2018-12-27: qty 16

## 2018-12-27 MED ORDER — ENSURE ENLIVE PO LIQD
237.0000 mL | Freq: Two times a day (BID) | ORAL | Status: DC
  Administered 2018-12-27 – 2018-12-30 (×7): 237 mL via ORAL

## 2018-12-27 MED ORDER — SODIUM CHLORIDE 0.9 % IV SOLN: 1.0000 g | Freq: Three times a day (TID) | INTRAVENOUS | Status: DC

## 2018-12-27 NOTE — Progress Notes (Signed)
RT placed patient on CPAP HS. 3L O2 bleed in needed. Patient tolerating well at this time. 

## 2018-12-27 NOTE — ED Provider Notes (Signed)
[facility] EMERGENCY DEPARTMENT Provider Note   CSN: [id number] Arrival date & time: 12/27/18  0016     History   Chief Complaint Chief Complaint  Patient presents with  . Shortness of Breath    HPI Andrew [name] is a 78 y.o. male.  78yo M w/ PMH including COPD on home O2, GERD, HTN, OSA who p/w respiratory distress.  Patient reports that he was admitted for COPD a few weeks ago.  He had been doing okay at home and followed up with his pulmonologist recently.  They took him off of Singulair and Advair and have been trying to taper down his daily 10mg  prednisone. Once he got to 6mg , he started having issues. He saw PCP 2 days ago and was restarted on prednisone burst. He has had 60mg  and 50mg  doses. This evening, he became progressively more short of breath despite breathing treatments and eventually he had to call EMS due to respiratory distress.  EMS gave duo nebs and Solu-Medrol in route.  The history is provided by the patient. The history is limited by the condition of the patient.  Shortness of Breath    Past Medical History:  Diagnosis Date  . Anemia   . BiPAP (biphasic positive airway pressure) dependence    Pt denies history of OSA  . BPH (benign prostatic hyperplasia)   . Cancer ([place])    skin  . COPD (chronic obstructive pulmonary disease) ([place])   . Dysphagia   . Emphysema lung ([place])   . GERD (gastroesophageal reflux disease)    " Silent reflux"  . Hearing loss    right ear  . History of hiatal hernia   . HLD (hyperlipidemia)   . Hypertension   . Oxygen dependent    2-3 liters  . Oxygen dependent   . Pneumonia   . Pulmonary hypertension ([place])   . Sleep apnea    wears cpap    Patient Active Problem List   Diagnosis Date Noted  . COPD exacerbation ([place]) 12/12/2018  . Immunosuppressed status ([place]) 12/03/2018  . Current chronic use of systemic steroids 12/03/2018  . Iron deficiency anemia, unspecified 04/20/2018  . PVCs (premature  ventricular contractions) 02/02/2018  . Pressure injury of skin 01/18/2018  . Influenza-like illness 01/17/2018  . History of pneumonia 12/16/2017  . OSA on CPAP 09/22/2017  . Paroxysmal atrial fibrillation ([facility])   . Malnutrition of moderate degree 09/10/2017  . Protein-calorie malnutrition, severe 12/09/2016  . Dysphagia 08/23/2016  . Normocytic anemia 06/28/2016  . Pressure sore on buttocks 06/28/2016  . Pulmonary hypertension ([place]) 02/20/2016  . GERD (gastroesophageal reflux disease) 02/20/2016  . COPD with acute exacerbation ([place]) 02/13/2016  . Acute respiratory failure with hypoxia ([place]) 02/12/2016  . Stage 3 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 ([place]) 02/11/2016  . Hypertension 02/11/2016  . Hyperlipidemia 02/11/2016  . CAP (community acquired pneumonia)   . BPH (benign prostatic hyperplasia) 12/29/2015  . COPD with emphysema ([place]) 10/02/2015  . Chronic hypoxemic respiratory failure ([place]) 10/02/2015  . History of pulmonary hypertension 10/02/2015    Past Surgical History:  Procedure Laterality Date  . CARDIAC CATHETERIZATION    . CATARACT EXTRACTION W/ INTRAOCULAR LENS  IMPLANT, BILATERAL    . COLONOSCOPY WITH PROPOFOL N/A 04/20/2018   Procedure: COLONOSCOPY WITH PROPOFOL;  Surgeon: Wilford Corner, MD;  Location: [facility];  Service: Endoscopy;  Laterality: N/A;  . ESOPHAGOGASTRODUODENOSCOPY (EGD) WITH PROPOFOL N/A 08/23/2016   Procedure: ESOPHAGOGASTRODUODENOSCOPY (EGD) WITH PROPOFOL;  Surgeon: Wilford Corner, MD;  Location: [facility];  Service: Endoscopy;  Laterality: N/A;  . ESOPHAGOGASTRODUODENOSCOPY (EGD) WITH PROPOFOL N/A 04/20/2018   Procedure: ESOPHAGOGASTRODUODENOSCOPY (EGD) WITH PROPOFOL;  Surgeon: Wilford Corner, MD;  Location: [facility];  Service: Endoscopy;  Laterality: N/A;  . HOT HEMOSTASIS N/A 04/20/2018   Procedure: HOT HEMOSTASIS (ARGON PLASMA COAGULATION/BICAP);  Surgeon: Wilford Corner, MD;  Location: [facility];  Service: Endoscopy;   Laterality: N/A;  . LUNG SURGERY    . POLYPECTOMY  04/20/2018   Procedure: POLYPECTOMY;  Surgeon: Wilford Corner, MD;  Location: [facility] ENDOSCOPY;  Service: Endoscopy;;  . TONSILLECTOMY          Home Medications    Prior to Admission medications   Medication Sig Start Date End Date Taking? Authorizing Provider  albuterol (PROVENTIL HFA;VENTOLIN HFA) 108 (90 Base) MCG/ACT inhaler Inhale 2 puffs into the lungs every 6 (six) hours as needed for wheezing or shortness of breath. 04/17/17   Noralee Space, MD  apixaban (ELIQUIS) 5 MG TABS tablet Take 1 tablet (5 mg total) by mouth 2 (two) times daily. 06/25/18   Skeet Latch, MD  budesonide (PULMICORT) 0.5 MG/2ML nebulizer solution Take 2 mLs (0.5 mg total) by nebulization 2 (two) times daily. 12/18/18   Icard, Octavio Graves, DO  budesonide-formoterol (SYMBICORT) 160-4.5 MCG/ACT inhaler Inhale 2 puffs into the lungs every 12 (twelve) hours. Patient not taking: Reported on 12/12/2018 12/03/18   Icard, Octavio Graves, DO  diltiazem (CARDIZEM CD) 180 MG 24 hr capsule TAKE 1 CAPSULE EVERY DAY Patient taking differently: Take 180 mg by mouth daily.  11/25/18   Almyra Deforest, PA  finasteride (PROSCAR) 5 MG tablet Take 5 mg by mouth at bedtime.     [provider]  fluticasone (FLONASE) 50 MCG/ACT nasal spray Place 1 spray into both nostrils at bedtime.  06/24/17   [provider]  guaiFENesin (MUCINEX) 600 MG 12 hr tablet Take 1 tablet (600 mg total) by mouth 2 (two) times daily. 09/12/17   Lavina Hamman, MD  hydrocortisone cream 1 % Apply 1 application topically daily as needed (Eczema on face and head).    [provider]  ipratropium-albuterol (DUONEB) 0.5-2.5 (3) MG/3ML SOLN Take 3 mLs by nebulization 3 (three) times daily. 11/04/17   Noralee Space, MD  IRON PO Take 1 tablet by mouth 3 (three) times a week.    [provider]  liver oil-zinc oxide (DESITIN) 40 % ointment Apply 1 application topically daily.    [provider]  Multiple Vitamins-Minerals (MULTIVITAMIN WITH MINERALS) tablet Take 1 tablet by mouth daily.    [provider]  omeprazole (PRILOSEC) 40 MG capsule Take 40 mg by mouth 2 (two) times daily.     [provider]  OXYGEN Inhale 2-3 L into the lungs See admin instructions. 2L daytime, 3L at night     [provider]  polyethylene glycol (MIRALAX / GLYCOLAX) packet Take 17 g by mouth daily. Patient taking differently: Take 17 g by mouth at bedtime. Mix in 8 oz liquid and drink 05/08/16   Mesner, Corene Cornea, MD  predniSONE (DELTASONE) 10 MG tablet Take 4 tablets (40 mg total) by mouth daily. 40 mg x 2 days then 30 mg x 2 days then 20 mg x 2 days then 10 mg until you see your Pulmonologist 12/15/18   Caren Griffins, MD  rosuvastatin (CRESTOR) 10 MG tablet Take 10 mg by mouth daily.    [provider]  sildenafil (REVATIO) 20 MG tablet Take 1  tablet (20 mg total) by mouth 2 (two) times daily. 10/23/16   Noralee Space, MD  Spacer/Aero-Holding Chambers (AEROCHAMBER MV) inhaler Use as instructed 12/03/18   Icard, Octavio Graves, DO  SPIRIVA HANDIHALER 18 MCG inhalation capsule INHALE THE CONTENTS OF 1 CAPSULE EVERY DAY Patient taking differently: Place 18 mcg into inhaler and inhale daily.  06/03/17   Noralee Space, MD  tamsulosin (FLOMAX) 0.4 MG CAPS capsule Take 1 capsule (0.4 mg total) by mouth daily after breakfast. 01/03/16   Nita Sells, MD  temazepam (RESTORIL) 30 MG capsule Take 1 capsule (30 mg total) by mouth at bedtime. 07/09/18   Noralee Space, MD  vitamin C (ASCORBIC ACID) 500 MG tablet Take 500 mg by mouth 2 (two) times daily.     [provider]  VITAMIN D PO Take 1 tablet by mouth daily.    [provider]    Family History Family History  Problem Relation Age of Onset  . Cancer Maternal Uncle   . Pancreatitis Mother   . Bone cancer Brother   . Stroke Maternal Grandfather   . Congestive Heart Failure Maternal  Grandfather     Social History Social History   Tobacco Use  . Smoking status: Former Smoker    Packs/day: 0.00    Years: 0.00    Pack years: 0.00    Types: Cigarettes    Last attempt to quit: 10/01/1984    Years since quitting: 34.2  . Smokeless tobacco: Never Used  Substance Use Topics  . Alcohol use: No    Alcohol/week: 0.0 standard drinks  . Drug use: No     Allergies   Patient has no known allergies.   Review of Systems Review of Systems  Unable to perform ROS: Severe respiratory distress  Respiratory: Positive for shortness of breath.      Physical Exam Updated Vital Signs BP 136/71   Pulse 97   Temp 98.1 F (36.7 C) (Oral)   Resp 18   Ht 5\' 7"  (1.702 m)   Wt 57 kg   SpO2 99%   BMI 19.68 kg/m   Physical Exam Vitals signs and nursing note reviewed.  Constitutional:      General: He is in acute distress.     Appearance: He is well-developed. He is ill-appearing.  HENT:     Head: Normocephalic and atraumatic.  Eyes:     Conjunctiva/sclera: Conjunctivae normal.  Neck:     Musculoskeletal: Neck supple.  Cardiovascular:     Rate and Rhythm: Normal rate and regular rhythm.     Heart sounds: Normal heart sounds. No murmur.  Pulmonary:     Effort: Tachypnea, accessory muscle usage and respiratory distress present.     Breath sounds: Decreased breath sounds present.     Comments: Tachypnea with increased work of breathing and respiratory distress, unable to speak in full sentences, severely diminished breath sounds bilaterally with almost no air movement Abdominal:     General: Bowel sounds are normal. There is no distension.     Palpations: Abdomen is soft.     Tenderness: There is no abdominal tenderness.  Musculoskeletal:     Right lower leg: No edema.     Left lower leg: No edema.  Skin:    General: Skin is warm and dry.  Neurological:     Mental Status: He is alert and oriented to person, place, and time.     Comments: Fluent speech,  tremulous  Psychiatric:  Mood and Affect: Mood is anxious.        Judgment: Judgment normal.      ED Treatments / Results  Labs (all labs ordered are listed, but only abnormal results are displayed) Labs Reviewed  COMPREHENSIVE METABOLIC PANEL - Abnormal; Notable for the following components:      Result Value   Glucose, Bld 117 (*)    BUN 24 (*)    Total Protein 6.0 (*)    All other components within normal limits  CBC WITH DIFFERENTIAL/PLATELET - Abnormal; Notable for the following components:   WBC 21.3 (*)    MCV 101.4 (*)    Neutro Abs 20.5 (*)    Lymphs Abs 0.3 (*)    Abs Immature Granulocytes 0.15 (*)    All other components within normal limits  POCT I-STAT EG7 - Abnormal; Notable for the following components:   pCO2, Ven 42.8 (*)    Calcium, Ion 1.13 (*)    All other components within normal limits  BRAIN NATRIURETIC PEPTIDE  TROPONIN I    EKG EKG Interpretation  Date/Time:  Sunday December 27 2018 00:29:15 EST Ventricular Rate:  83 PR Interval:    QRS Duration: 92 QT Interval:  365 QTC Calculation: 421 R Axis:   20 Text Interpretation:  Atrial fibrillation Ventricular premature complex RSR' in V1 or V2, probably normal variant Borderline ST depression, diffuse leads artifact but overall similar to previous Confirmed by Theotis Burrow [phone]) on 12/27/2018 12:53:05 AM   Radiology Dg Chest Port 1 View  Result Date: 12/27/2018 CLINICAL DATA:  Respiratory distress. COPD. EXAM: PORTABLE CHEST 1 VIEW COMPARISON:  12/13/2018 chest radiograph. FINDINGS: Stable cardiomediastinal silhouette with normal heart size. No pneumothorax. No pleural effusion. Hyperinflated lungs and advanced emphysema. No acute consolidative airspace disease. No pulmonary edema. IMPRESSION: 1. No acute cardiopulmonary disease. 2. Hyperinflated lungs and advanced emphysema, compatible with the provided history of COPD. Electronically Signed   By: Ilona Sorrel M.D.   On: 12/27/2018 01:17     Procedures .Critical Care Performed by: Sharlett Iles, MD Authorized by: Sharlett Iles, MD   Critical care provider statement:    Critical care time (minutes):  30   Critical care time was exclusive of:  Separately billable procedures and treating other patients   Critical care was necessary to treat or prevent imminent or life-threatening deterioration of the following conditions:  Respiratory failure   Critical care was time spent personally by me on the following activities:  Development of treatment plan with patient or surrogate, evaluation of patient's response to treatment, examination of patient, obtaining history from patient or surrogate, ordering and performing treatments and interventions, ordering and review of laboratory studies, ordering and review of radiographic studies, re-evaluation of patient's condition and review of old charts   (including critical care time)  Medications Ordered in ED Medications  albuterol (PROVENTIL,VENTOLIN) solution continuous neb (15 mg/hr Nebulization New Bag/Given 12/27/18 0057)  levofloxacin (LEVAQUIN) IVPB 750 mg (has no administration in time range)     Initial Impression / Assessment and Plan / ED Course  I have reviewed the triage vital signs and the nursing notes.  Pertinent labs & imaging results that were available during my care of the patient were reviewed by me and considered in my medical decision making (see chart for details).    In respiratory distress on arrival, Stable VS on duoneb. Started 1 hr continuous albuterol.  VBG reassuring with venous pH 7.41, PCO2 42.  MP normal.  CBC  shows WBC 21,000.  He was recently restarted on steroid burst and this may reflect the steroid use but he endorses new yellow sputum production therefore ordered Levaquin.  Chest x-ray is negative acute.  On reassessment after continuous albuterol treatment, he is still mildly dyspneic but much improved and able to speak in full  sentences.  Discussed admission with Internal med teaching service and pt admitted for further care.  Final Clinical Impressions(s) / ED Diagnoses   Final diagnoses:  Acute respiratory failure with hypoxia [facility])  COPD exacerbation [facility])    ED Discharge Orders    None       Ki Corbo, Wenda Overland, MD 12/27/18 [phone]

## 2018-12-27 NOTE — Evaluation (Signed)
Physical Therapy Evaluation Patient Details Name: Andrew [name] MRN: [id number] DOB: 11-Jul-1941 Today's Date: 12/27/2018   History of Present Illness  Pt is a 78 y.o. 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  He was admitted for COPD exacerbation.     Clinical Impression  Pt admitted with above diagnosis. Pt currently with functional limitations due to the deficits listed below (see PT Problem List). PTA pt lived at home with family, independent with mobility on home O2. On eval, he required supervision transfers and supervision ambulation 30 feet without AD. Mobility significantly limited by DOE. Pt on 2 L O2 with SpO2 99%.  Pt will benefit from skilled PT to increase their independence and safety with mobility to allow discharge to the venue listed below.  PT to follow acutely. No follow up services indicated.     Follow Up Recommendations No PT follow up    Equipment Recommendations  None recommended by PT    Recommendations for Other Services       Precautions / Restrictions Precautions Precautions: Fall Precaution Comments: on home O2, 2 liters during the day and 3 L at night      Mobility  Bed Mobility Overal bed mobility: Independent                Transfers Overall transfer level: Needs assistance Equipment used: None Transfers: Sit to/from [facility] Sit to Stand: Supervision Stand pivot transfers: Supervision       General transfer comment: supervision for safety  Ambulation/Gait Ambulation/Gait assistance: Supervision Gait Distance (Feet): 30 Feet Assistive device: None Gait Pattern/deviations: WFL(Within Functional Limits) Gait velocity: decreased Gait velocity interpretation: <1.31 ft/sec, indicative of household ambulator General Gait Details: 3/4 DOE with minimal activity, SpO2 99% on 2 L continuous O2  Stairs             Wheelchair Mobility    Modified Rankin (Stroke Patients Only)       Balance Overall balance assessment: No apparent balance deficits (not formally assessed)                                           Pertinent Vitals/Pain Pain Assessment: No/denies pain    Home Living Family/patient expects to be discharged to:: Private residence Living Arrangements: Spouse/significant other;Children Available Help at Discharge: Family;Available 24 hours/day Type of Home: House Home Access: Level entry     Home Layout: One level Home Equipment: Shower seat Additional Comments: oxygen concentrator, CPAP    Prior Function Level of Independence: Independent               Hand Dominance   Dominant Hand: Right    Extremity/Trunk Assessment   Upper Extremity Assessment Upper Extremity Assessment: Overall WFL for tasks assessed    Lower Extremity Assessment Lower Extremity Assessment: Overall WFL for tasks assessed    Cervical / Trunk Assessment Cervical / Trunk Assessment: Normal  Communication   Communication: HOH  Cognition Arousal/Alertness: Awake/alert Behavior During Therapy: WFL for tasks assessed/performed Overall Cognitive Status: Within Functional Limits for tasks assessed                                        General Comments  General comments (skin integrity, edema, etc.): Pt received breathing Rx just prior to PT eval.    Exercises     Assessment/Plan    PT Assessment Patient needs continued PT services  PT Problem List Decreased mobility;Decreased activity tolerance;Cardiopulmonary status limiting activity       PT Treatment Interventions Functional mobility training;Patient/family education;Gait training;Therapeutic activities;Stair training    PT Goals (Current goals can be found in the Care Plan section)  Acute Rehab PT Goals Patient Stated Goal: home PT Goal Formulation: With patient Time For Goal  Achievement: 01/10/19 Potential to Achieve Goals: Good    Frequency Min 3X/week   Barriers to discharge        Co-evaluation               AM-PAC PT "6 Clicks" Mobility  Outcome Measure Help needed turning from your back to your side while in a flat bed without using bedrails?: None Help needed moving from lying on your back to sitting on the side of a flat bed without using bedrails?: None Help needed moving to and from a bed to a chair (including a wheelchair)?: A Little Help needed standing up from a chair using your arms (e.g., wheelchair or bedside chair)?: None Help needed to walk in hospital room?: A Little Help needed climbing 3-5 steps with a railing? : A Little 6 Click Score: 21    End of Session Equipment Utilized During Treatment: Oxygen Activity Tolerance: Treatment limited secondary to medical complications (Comment)(DOE) Patient left: in bed;with call bell/phone within reach Nurse Communication: Mobility status PT Visit Diagnosis: Difficulty in walking, not elsewhere classified (R26.2)    Time: 9169-4503 PT Time Calculation (min) (ACUTE ONLY): 17 min   Charges:   PT Evaluation $PT Eval Low Complexity: 1 Low          Lorrin Goodell, PT  Office # [phone] Pager [phone]   Lorriane Shire 12/27/2018, 3:49 PM

## 2018-12-27 NOTE — H&P (Signed)
History and Physical    Andrew [name] DGU:[id number] DOB: 1941-10-25 DOA: 12/27/2018  PCP: Lujean Amel, MD  Patient coming from: Home  I have personally briefly reviewed patient's old medical records in [place]  Chief Complaint: SOB  HPI: Andrew [name] is a 78 y.o. 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  Patient recently admitted from 1/25-1/28 for COPD exacerbation.  He had been doing okay at home and followed up with his pulmonologist recently.  They took him off of Singulair and Advair and have been trying to taper down his daily 10mg  prednisone. Once he got to 6mg , he started having issues. He saw PCP 2 days ago and was restarted on prednisone burst. He has had 60mg  and 50mg  doses. This evening, he became progressively more short of breath despite breathing treatments and eventually he had to call EMS due to respiratory distress.  EMS gave solumedrol and neb treatments en route.   ED Course: Breathing improved after hr long neb treatment.   Review of Systems: As per HPI otherwise 10 point review of systems negative.   Past Medical History:  Diagnosis Date  . Anemia   . BiPAP (biphasic positive airway pressure) dependence    Pt denies history of OSA  . BPH (benign prostatic hyperplasia)   . Cancer ([place])    skin  . COPD (chronic obstructive pulmonary disease) ([place])   . Dysphagia   . Emphysema lung ([place])   . GERD (gastroesophageal reflux disease)    " Silent reflux"  . Hearing loss    right ear  . History of hiatal hernia   . HLD (hyperlipidemia)   . Hypertension   . Oxygen dependent    2-3 liters  . Oxygen dependent   . Pneumonia   . Pulmonary hypertension ([place])   . Sleep apnea    wears cpap    Past Surgical History:  Procedure Laterality Date  . CARDIAC CATHETERIZATION    . CATARACT EXTRACTION W/ INTRAOCULAR LENS  IMPLANT, BILATERAL    .  COLONOSCOPY WITH PROPOFOL N/A 04/20/2018   Procedure: COLONOSCOPY WITH PROPOFOL;  Surgeon: Wilford Corner, MD;  Location: [facility];  Service: Endoscopy;  Laterality: N/A;  . ESOPHAGOGASTRODUODENOSCOPY (EGD) WITH PROPOFOL N/A 08/23/2016   Procedure: ESOPHAGOGASTRODUODENOSCOPY (EGD) WITH PROPOFOL;  Surgeon: Wilford Corner, MD;  Location: [facility] ENDOSCOPY;  Service: Endoscopy;  Laterality: N/A;  . ESOPHAGOGASTRODUODENOSCOPY (EGD) WITH PROPOFOL N/A 04/20/2018   Procedure: ESOPHAGOGASTRODUODENOSCOPY (EGD) WITH PROPOFOL;  Surgeon: Wilford Corner, MD;  Location: [facility];  Service: Endoscopy;  Laterality: N/A;  . HOT HEMOSTASIS N/A 04/20/2018   Procedure: HOT HEMOSTASIS (ARGON PLASMA COAGULATION/BICAP);  Surgeon: Wilford Corner, MD;  Location: [facility];  Service: Endoscopy;  Laterality: N/A;  . LUNG SURGERY    . POLYPECTOMY  04/20/2018   Procedure: POLYPECTOMY;  Surgeon: Wilford Corner, MD;  Location: [facility] ENDOSCOPY;  Service: Endoscopy;;  . TONSILLECTOMY       reports that he quit smoking about 34 years ago. His smoking use included cigarettes. He smoked 0.00 packs per day for 0.00 years. He has never used smokeless tobacco. He reports that he does not drink alcohol or use drugs.  No Known Allergies  Family History  Problem Relation Age of Onset  . Cancer Maternal Uncle   . Pancreatitis Mother   . Bone cancer Brother   . Stroke Maternal Grandfather   . Congestive Heart Failure Maternal Grandfather  Prior to Admission medications   Medication Sig Start Date End Date Taking? Authorizing Provider  albuterol (PROVENTIL HFA;VENTOLIN HFA) 108 (90 Base) MCG/ACT inhaler Inhale 2 puffs into the lungs every 6 (six) hours as needed for wheezing or shortness of breath. 04/17/17   Noralee Space, MD  apixaban (ELIQUIS) 5 MG TABS tablet Take 1 tablet (5 mg total) by mouth 2 (two) times daily. 06/25/18   Skeet Latch, MD  budesonide (PULMICORT) 0.5 MG/2ML nebulizer solution Take 2 mLs  (0.5 mg total) by nebulization 2 (two) times daily. 12/18/18   Icard, Octavio Graves, DO  budesonide-formoterol (SYMBICORT) 160-4.5 MCG/ACT inhaler Inhale 2 puffs into the lungs every 12 (twelve) hours. Patient not taking: Reported on 12/12/2018 12/03/18   Icard, Octavio Graves, DO  diltiazem (CARDIZEM CD) 180 MG 24 hr capsule TAKE 1 CAPSULE EVERY DAY Patient taking differently: Take 180 mg by mouth daily.  11/25/18   Almyra Deforest, PA  finasteride (PROSCAR) 5 MG tablet Take 5 mg by mouth at bedtime.     [provider]  fluticasone (FLONASE) 50 MCG/ACT nasal spray Place 1 spray into both nostrils at bedtime.  06/24/17   [provider]  guaiFENesin (MUCINEX) 600 MG 12 hr tablet Take 1 tablet (600 mg total) by mouth 2 (two) times daily. 09/12/17   Lavina Hamman, MD  hydrocortisone cream 1 % Apply 1 application topically daily as needed (Eczema on face and head).    [provider]  ipratropium-albuterol (DUONEB) 0.5-2.5 (3) MG/3ML SOLN Take 3 mLs by nebulization 3 (three) times daily. 11/04/17   Noralee Space, MD  IRON PO Take 1 tablet by mouth 3 (three) times a week.    [provider]  liver oil-zinc oxide (DESITIN) 40 % ointment Apply 1 application topically daily.    [provider]  Multiple Vitamins-Minerals (MULTIVITAMIN WITH MINERALS) tablet Take 1 tablet by mouth daily.    [provider]  omeprazole (PRILOSEC) 40 MG capsule Take 40 mg by mouth 2 (two) times daily.     [provider]  OXYGEN Inhale 2-3 L into the lungs See admin instructions. 2L daytime, 3L at night     [provider]  polyethylene glycol (MIRALAX / GLYCOLAX) packet Take 17 g by mouth daily. Patient taking differently: Take 17 g by mouth at bedtime. Mix in 8 oz liquid and drink 05/08/16   Mesner, Corene Cornea, MD  predniSONE (DELTASONE) 10 MG tablet Take 4 tablets (40 mg total) by mouth daily. 40 mg x 2 days then 30 mg x 2 days then 20 mg x 2 days then 10 mg until you see your  Pulmonologist 12/15/18   Caren Griffins, MD  rosuvastatin (CRESTOR) 10 MG tablet Take 10 mg by mouth daily.    [provider]  sildenafil (REVATIO) 20 MG tablet Take 1 tablet (20 mg total) by mouth 2 (two) times daily. 10/23/16   Noralee Space, MD  Spacer/Aero-Holding Chambers (AEROCHAMBER MV) inhaler Use as instructed 12/03/18   Icard, Octavio Graves, DO  SPIRIVA HANDIHALER 18 MCG inhalation capsule INHALE THE CONTENTS OF 1 CAPSULE EVERY DAY Patient taking differently: Place 18 mcg into inhaler and inhale daily.  06/03/17   Noralee Space, MD  tamsulosin (FLOMAX) 0.4 MG CAPS capsule Take 1 capsule (0.4 mg total) by mouth daily after breakfast. 01/03/16   Nita Sells, MD  temazepam (RESTORIL) 30 MG capsule Take 1 capsule (30 mg total) by mouth at bedtime. 07/09/18   Teressa Lower  M, MD  vitamin C (ASCORBIC ACID) 500 MG tablet Take 500 mg by mouth 2 (two) times daily.     [provider]  VITAMIN D PO Take 1 tablet by mouth daily.    [provider]    Physical Exam: Vitals:   12/27/18 0200 12/27/18 0230 12/27/18 0245 12/27/18 0315  BP: 139/63 138/67 136/71 125/73  Pulse: 93 100 97 (!) 102  Resp: 18 20 18    Temp:      TempSrc:      SpO2: 98% 99% 99% 95%  Weight:      Height:        Constitutional: NAD, calm, comfortable Eyes: PERRL, lids and conjunctivae normal ENMT: Mucous membranes are moist. Posterior pharynx clear of any exudate or lesions.Normal dentition.  Neck: normal, supple, no masses, no thyromegaly Respiratory: clear to auscultation bilaterally, no wheezing, no crackles. Normal respiratory effort. No accessory muscle use.  Cardiovascular: Regular rate and rhythm, no murmurs / rubs / gallops. No extremity edema. 2+ pedal pulses. No carotid bruits.  Abdomen: no tenderness, no masses palpated. No hepatosplenomegaly. Bowel sounds positive.  Musculoskeletal: no clubbing / cyanosis. No joint deformity upper and lower extremities. Good ROM, no  contractures. Normal muscle tone.  Skin: no rashes, lesions, ulcers. No induration Neurologic: CN 2-12 grossly intact. Sensation intact, DTR normal. Strength 5/5 in all 4.  Psychiatric: Normal judgment and insight. Alert and oriented x 3. Normal mood.    Labs on Admission: I have personally reviewed following labs and imaging studies  CBC: Recent Labs  Lab 12/27/18 0104 12/27/18 0119  WBC 21.3*  --   NEUTROABS 20.5*  --   HGB 14.4 14.3  HCT 44.5 42.0  MCV 101.4*  --   PLT 214  --    Basic Metabolic Panel: Recent Labs  Lab 12/27/18 0104 12/27/18 0119  NA 141 139  K 4.8 4.8  CL 105  --   CO2 25  --   GLUCOSE 117*  --   BUN 24*  --   CREATININE 0.88  --   CALCIUM 9.0  --    GFR: Estimated Creatinine Clearance: 54.9 mL/min (by C-G formula based on SCr of 0.88 mg/dL). Liver Function Tests: Recent Labs  Lab 12/27/18 0104  AST 22  ALT 42  ALKPHOS 57  BILITOT 0.7  PROT 6.0*  ALBUMIN 3.6   No results for input(s): LIPASE, AMYLASE in the last 168 hours. No results for input(s): AMMONIA in the last 168 hours. Coagulation Profile: No results for input(s): INR, PROTIME in the last 168 hours. Cardiac Enzymes: Recent Labs  Lab 12/27/18 0104  TROPONINI <0.03   BNP (last 3 results) Recent Labs    09/28/18 1242  PROBNP 23.0   HbA1C: No results for input(s): HGBA1C in the last 72 hours. CBG: No results for input(s): GLUCAP in the last 168 hours. Lipid Profile: No results for input(s): CHOL, HDL, LDLCALC, TRIG, CHOLHDL, LDLDIRECT in the last 72 hours. Thyroid Function Tests: No results for input(s): TSH, T4TOTAL, FREET4, T3FREE, THYROIDAB in the last 72 hours. Anemia Panel: No results for input(s): VITAMINB12, FOLATE, FERRITIN, TIBC, IRON, RETICCTPCT in the last 72 hours. Urine analysis:    Component Value Date/Time   COLORURINE STRAW (A) 04/26/2018 0330   APPEARANCEUR CLEAR 04/26/2018 0330   LABSPEC 1.008 04/26/2018 0330   PHURINE 6.0 04/26/2018 0330    GLUCOSEU NEGATIVE 04/26/2018 0330   HGBUR NEGATIVE 04/26/2018 0330   BILIRUBINUR NEGATIVE 04/26/2018 0330   KETONESUR 5 (A)  04/26/2018 0330   PROTEINUR NEGATIVE 04/26/2018 0330   NITRITE NEGATIVE 04/26/2018 0330   LEUKOCYTESUR NEGATIVE 04/26/2018 0330    Radiological Exams on Admission: Dg Chest Port 1 View  Result Date: 12/27/2018 CLINICAL DATA:  Respiratory distress. COPD. EXAM: PORTABLE CHEST 1 VIEW COMPARISON:  12/13/2018 chest radiograph. FINDINGS: Stable cardiomediastinal silhouette with normal heart size. No pneumothorax. No pleural effusion. Hyperinflated lungs and advanced emphysema. No acute consolidative airspace disease. No pulmonary edema. IMPRESSION: 1. No acute cardiopulmonary disease. 2. Hyperinflated lungs and advanced emphysema, compatible with the provided history of COPD. Electronically Signed   By: Ilona Sorrel M.D.   On: 12/27/2018 01:17    EKG: Independently reviewed.  Assessment/Plan Principal Problem:   COPD with acute exacerbation (HCC) Active Problems:   Chronic hypoxemic respiratory failure (HCC)   History of pulmonary hypertension   Stage 3 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 (HCC)   Paroxysmal atrial fibrillation (HCC)   OSA on CPAP   Current chronic use of systemic steroids    1. COPD exacerbation - 1. COPD pathway 2. Scheduled dulera and Spiriva 3. Solumedrol 60mg  IV Q12H for the moment 4. Cefepime 5. PRN albuterol 6. Continuous pulse ox 2. PAH - continue revatio BID 3. PAF - Continue cardizem and eliquis 4. OSA - continue nightly CPAP  DVT prophylaxis: Eliquis Code Status: Full Family Communication: No family in room Disposition Plan: Home after admit Consults called: None Admission status: place in obs   Dante Cooter, [place] Hospitalists  How to contact the [facility] Attending or Consulting provider [place] or covering provider during after hours [place], for this patient?  1. Check the care team in [facility] and look for a)  attending/consulting [facility] provider listed and b) the [facility] team listed 2. Log into [email or web address]  Amion Physician Scheduling and messaging for groups and whole hospitals  On call and physician scheduling software for group practices, residents, hospitalists and other medical providers for call, clinic, rotation and shift schedules. OnCall [employer] is a hospital-wide system for scheduling doctors and paging doctors on call. EasyPlot is for scientific plotting and data analysis.  [email or web address]  and use [place]'s universal password to access. If you do not have the password, please contact the hospital operator.  3. Locate the [facility] provider you are looking for under [facility] and page to a number that you can be directly reached. 4. If you still have difficulty reaching the provider, please page the [facility] (Director on Call) for the Hospitalists listed on amion for assistance.  12/27/2018, 3:44 AM

## 2018-12-27 NOTE — Progress Notes (Signed)
Pt c/o oral thrush, states he takes Nystatin at home w/c started last Friday. Text page X. Blount,NP.

## 2018-12-27 NOTE — ED Triage Notes (Signed)
Pt bib [facility] from home.  Pt in normal state of health when he developed shob.  Pt used duo neb at home w/o relief.  Pt w/ hx of b/l upper lobectomy.  Per EMS, wheezing in bases.  Pt given 125 mg solumedrol, 10 mg albuterol and 1 mg atrovent en route.  Pt still w/ labored breathing, accessory muscle use.  Pt on chronic O2 2L during the day and 3L at night.

## 2018-12-27 NOTE — Discharge Instructions (Signed)

## 2018-12-27 NOTE — Progress Notes (Signed)
SATURATION QUALIFICATIONS: (This note is used to comply with regulatory documentation for home oxygen)  Patient Saturations on Room Air at Rest = 87%  Patient Saturations on Room Air while Ambulating = Patient refused   Patient Saturations on 2 Liters of oxygen while Ambulating = 92% after 5 minutes of ambulation.  Please briefly explain why patient needs home oxygen:

## 2018-12-27 NOTE — Progress Notes (Signed)
RT called back to patient room. Patient is not tolerating mask. RT placed patient back on 3L [place]. Patient states he will just wear the [place] tonight.

## 2018-12-27 NOTE — Progress Notes (Signed)
[facility] PROGRESS NOTE  Andrew [name] [id number]:[id number] DOB: 09-13-1941 DOA: 12/27/2018 PCP: Lujean Amel, MD  Assessment/Plan: 1. COPD exacerbation, improving.  Currently on home oxygen regimen with improvement in wheezing on exam and no dyspnea at rest.  Given patient is on prolonged steroids will continue IV cefepime due to concern for pseudomonal involvement.  Also obtaining RVP as potential nidus for exacerbation.  Continue IV steroids for now also above resulted.  Change to scheduled duo nebs every 6 hours, PRN albuterol nebs monitor 2. A. fib, rate controlled.  Continue home diltiazem and Eliquis.  Scheduled Mucinex, add flutter valve, and continues respiratory 3. BPH, stable continue finasteride hyperlipidemia, stable continue Crestor   Code Status: FULL Family Communication:  none  Disposition Plan: We will continue to monitor to see improvement while on IV cefepime, IV Solu-Medrol while awaiting RVP    Consultants:  none  Procedures:  None  Antibiotics:  IV cefepime (2/9)  IV Levaquin (2/9)  HPI/Subjective:  Andrew [name] is a 78 y.o. 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  He states after completing taper earlier this week he noticed worsening dyspnea on exertion (minimal), increased cough productive of yellow sputum.  His PCP put him on a new prednisone taper starting at 60 mg on 2/7.  He seemed to be doing better also the evening of 2/8 noticed increase shortness of breath and improving with increased use of his home duo nebs and scheduled Pulmicort and Spiriva.  He typically uses 3 L of oxygen at home he called EMS who increased him to 6 L with some improvement in his breathing. Since being in the hospital, chest  x-ray was negative for any pneumonia.  He has been on IV Levaquin then transition to IV cefepime for pseudomonal coverage related to COPD flare and continued on IV Solu-Medrol and scheduled inhalers with great improvement in his breathing.     Objective: Vitals:   12/27/18 0315 12/27/18 0438  BP: 125/73 (!) 147/77  Pulse: (!) 102 96  Resp:  20  Temp:  98 F (36.7 C)  SpO2: 95% 100%    Intake/Output Summary (Last 24 hours) at 12/27/2018 0926 Last data filed at 12/27/2018 0600 Gross per 24 hour  Intake 340 ml  Output -  Net 340 ml   Filed Weights   12/27/18 0022 12/27/18 0438  Weight: 57 kg 57.7 kg    Exam:   General: Lying in bed in no distress  Cardiovascular: IRR, no edema, no murmurs, rubs or gallops  Respiratory: on 3 L diminished breath sounds, no wheezing or crackles  Abdomen: Soft nondistended nontender  Musculoskeletal: Normal range of motion  Skin no rashes or lesions  Neurologic alert and oriented x4, no appreciable focal deficits  Data Reviewed: Basic Metabolic Panel: Recent Labs  Lab 12/27/18 0104 12/27/18 0119  NA 141 139  K 4.8 4.8  CL 105  --   CO2 25  --   GLUCOSE 117*  --   BUN 24*  --   CREATININE 0.88  --   CALCIUM 9.0  --    Liver Function Tests: Recent Labs  Lab 12/27/18 0104  AST 22  ALT 42  ALKPHOS 57  BILITOT 0.7  PROT 6.0*  ALBUMIN 3.6   No results for input(s): LIPASE, AMYLASE  in the last 168 hours. No results for input(s): AMMONIA in the last 168 hours. CBC: Recent Labs  Lab 12/27/18 0104 12/27/18 0119  WBC 21.3*  --   NEUTROABS 20.5*  --   HGB 14.4 14.3  HCT 44.5 42.0  MCV 101.4*  --   PLT 214  --    Cardiac Enzymes: Recent Labs  Lab 12/27/18 0104  TROPONINI <0.03   BNP (last 3 results) Recent Labs    12/27/18 0104  BNP 51.4    ProBNP (last 3 results) Recent Labs    09/28/18 1242  PROBNP 23.0    CBG: No results for input(s): GLUCAP in the last 168 hours.  No results found for this or any  previous visit (from the past 240 hour(s)).   Studies: Dg Chest Port 1 View  Result Date: 12/27/2018 CLINICAL DATA:  Respiratory distress. COPD. EXAM: PORTABLE CHEST 1 VIEW COMPARISON:  12/13/2018 chest radiograph. FINDINGS: Stable cardiomediastinal silhouette with normal heart size. No pneumothorax. No pleural effusion. Hyperinflated lungs and advanced emphysema. No acute consolidative airspace disease. No pulmonary edema. IMPRESSION: 1. No acute cardiopulmonary disease. 2. Hyperinflated lungs and advanced emphysema, compatible with the provided history of COPD. Electronically Signed   By: Ilona Sorrel M.D.   On: 12/27/2018 01:17    Scheduled Meds: . apixaban  5 mg Oral BID  . diltiazem  180 mg Oral Daily  . feeding supplement (ENSURE ENLIVE)  237 mL Oral BID BM  . finasteride  5 mg Oral QHS  . fluticasone  1 spray Each Nare QHS  . guaiFENesin  600 mg Oral BID  . ipratropium-albuterol  3 mL Nebulization Q6H  . mouth rinse  15 mL Mouth Rinse BID  . methylPREDNISolone (SOLU-MEDROL) injection  60 mg Intravenous Q12H  . nystatin  5 mL Oral QID  . pantoprazole  80 mg Oral BID  . polyethylene glycol  17 g Oral QHS  . rosuvastatin  10 mg Oral Daily  . sildenafil  20 mg Oral BID  . tamsulosin  0.4 mg Oral QPC breakfast  . temazepam  30 mg Oral QHS   Continuous Infusions: . ceFEPime (MAXIPIME) IV      Principal Problem:   COPD with acute exacerbation (HCC) Active Problems:   Chronic hypoxemic respiratory failure (HCC)   History of pulmonary hypertension   Stage 3 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 (HCC)   Paroxysmal atrial fibrillation (HCC)   OSA on CPAP   Current chronic use of systemic steroids      Desiree Hane  [facility]

## 2018-12-28 DIAGNOSIS — Z7951 Long term (current) use of inhaled steroids: Secondary | ICD-10-CM | POA: Diagnosis not present

## 2018-12-28 DIAGNOSIS — Z8249 Family history of ischemic heart disease and other diseases of the circulatory system: Secondary | ICD-10-CM | POA: Diagnosis not present

## 2018-12-28 DIAGNOSIS — H9191 Unspecified hearing loss, right ear: Secondary | ICD-10-CM | POA: Diagnosis present

## 2018-12-28 DIAGNOSIS — Z8679 Personal history of other diseases of the circulatory system: Secondary | ICD-10-CM | POA: Diagnosis not present

## 2018-12-28 DIAGNOSIS — Z79899 Other long term (current) drug therapy: Secondary | ICD-10-CM | POA: Diagnosis not present

## 2018-12-28 DIAGNOSIS — T380X5A Adverse effect of glucocorticoids and synthetic analogues, initial encounter: Secondary | ICD-10-CM | POA: Diagnosis present

## 2018-12-28 DIAGNOSIS — I10 Essential (primary) hypertension: Secondary | ICD-10-CM | POA: Diagnosis present

## 2018-12-28 DIAGNOSIS — Z7952 Long term (current) use of systemic steroids: Secondary | ICD-10-CM | POA: Diagnosis not present

## 2018-12-28 DIAGNOSIS — Z85118 Personal history of other malignant neoplasm of bronchus and lung: Secondary | ICD-10-CM | POA: Diagnosis not present

## 2018-12-28 DIAGNOSIS — I272 Pulmonary hypertension, unspecified: Secondary | ICD-10-CM | POA: Diagnosis present

## 2018-12-28 DIAGNOSIS — Z9989 Dependence on other enabling machines and devices: Secondary | ICD-10-CM | POA: Diagnosis not present

## 2018-12-28 DIAGNOSIS — K219 Gastro-esophageal reflux disease without esophagitis: Secondary | ICD-10-CM | POA: Diagnosis present

## 2018-12-28 DIAGNOSIS — G4733 Obstructive sleep apnea (adult) (pediatric): Secondary | ICD-10-CM | POA: Diagnosis present

## 2018-12-28 DIAGNOSIS — I480 Paroxysmal atrial fibrillation: Secondary | ICD-10-CM | POA: Diagnosis present

## 2018-12-28 DIAGNOSIS — Z9981 Dependence on supplemental oxygen: Secondary | ICD-10-CM | POA: Diagnosis not present

## 2018-12-28 DIAGNOSIS — Z7901 Long term (current) use of anticoagulants: Secondary | ICD-10-CM | POA: Diagnosis not present

## 2018-12-28 DIAGNOSIS — J449 Chronic obstructive pulmonary disease, unspecified: Secondary | ICD-10-CM | POA: Diagnosis not present

## 2018-12-28 DIAGNOSIS — J441 Chronic obstructive pulmonary disease with (acute) exacerbation: Secondary | ICD-10-CM | POA: Diagnosis present

## 2018-12-28 DIAGNOSIS — E785 Hyperlipidemia, unspecified: Secondary | ICD-10-CM | POA: Diagnosis present

## 2018-12-28 DIAGNOSIS — R739 Hyperglycemia, unspecified: Secondary | ICD-10-CM | POA: Diagnosis present

## 2018-12-28 DIAGNOSIS — J9621 Acute and chronic respiratory failure with hypoxia: Secondary | ICD-10-CM | POA: Diagnosis present

## 2018-12-28 DIAGNOSIS — Z87891 Personal history of nicotine dependence: Secondary | ICD-10-CM | POA: Diagnosis not present

## 2018-12-28 DIAGNOSIS — J9611 Chronic respiratory failure with hypoxia: Secondary | ICD-10-CM | POA: Diagnosis not present

## 2018-12-28 DIAGNOSIS — N400 Enlarged prostate without lower urinary tract symptoms: Secondary | ICD-10-CM | POA: Diagnosis present

## 2018-12-28 LAB — BASIC METABOLIC PANEL
Anion gap: 12 (ref 5–15)
BUN: 24 mg/dL — AB (ref 8–23)
CO2: 27 mmol/L (ref 22–32)
Calcium: 8.8 mg/dL — ABNORMAL LOW (ref 8.9–10.3)
Chloride: 100 mmol/L (ref 98–111)
Creatinine, Ser: 1.08 mg/dL (ref 0.61–1.24)
GFR calc Af Amer: >60 mL/min (ref >60)
GFR calc non Af Amer: >60 mL/min (ref >60)
Glucose, Bld: 135 mg/dL — ABNORMAL HIGH (ref 70–99)
Potassium: 4.3 mmol/L (ref 3.5–5.1)
Sodium: 139 mmol/L (ref 135–145)

## 2018-12-28 LAB — RESPIRATORY PANEL BY PCR
Adenovirus: NOT DETECTED
Bordetella pertussis: NOT DETECTED
Chlamydophila pneumoniae: NOT DETECTED
Coronavirus 229E: NOT DETECTED
Coronavirus HKU1: NOT DETECTED
Coronavirus NL63: NOT DETECTED
Coronavirus OC43: NOT DETECTED
Influenza A: NOT DETECTED
Influenza B: NOT DETECTED
Metapneumovirus: NOT DETECTED
Mycoplasma pneumoniae: NOT DETECTED
Parainfluenza Virus 1: NOT DETECTED
Parainfluenza Virus 2: NOT DETECTED
Parainfluenza Virus 3: NOT DETECTED
Parainfluenza Virus 4: NOT DETECTED
RHINOVIRUS / ENTEROVIRUS - RVPPCR: NOT DETECTED
Respiratory Syncytial Virus: NOT DETECTED

## 2018-12-28 LAB — CBC
HCT: 40.8 % (ref 39.0–52.0)
HEMOGLOBIN: 13.4 g/dL (ref 13.0–17.0)
MCH: 32.8 pg (ref 26.0–34.0)
MCHC: 32.8 g/dL (ref 30.0–36.0)
MCV: 99.8 fL (ref 80.0–100.0)
Platelets: 173 10*3/uL (ref 150–400)
RBC: 4.09 MIL/uL — ABNORMAL LOW (ref 4.22–5.81)
RDW: 12.7 % (ref 11.5–15.5)
WBC: 11.6 10*3/uL — ABNORMAL HIGH (ref 4.0–10.5)
nRBC: 0 % (ref 0.0–0.2)

## 2018-12-28 MED ORDER — PREDNISONE 20 MG PO TABS
40.0000 mg | ORAL_TABLET | Freq: Every day | ORAL | Status: DC
  Administered 2018-12-28 – 2018-12-30 (×3): 40 mg via ORAL
  Filled 2018-12-28 (×3): qty 2

## 2018-12-28 MED ORDER — ADULT MULTIVITAMIN W/MINERALS CH
1.0000 | ORAL_TABLET | Freq: Every day | ORAL | Status: DC
  Administered 2018-12-28 – 2018-12-30 (×3): 1 via ORAL
  Filled 2018-12-28 (×3): qty 1

## 2018-12-28 MED ORDER — BUDESONIDE 0.5 MG/2ML IN SUSP
0.5000 mg | Freq: Two times a day (BID) | RESPIRATORY_TRACT | Status: DC
  Administered 2018-12-28 – 2018-12-30 (×5): 0.5 mg via RESPIRATORY_TRACT
  Filled 2018-12-28 (×5): qty 2

## 2018-12-28 NOTE — Evaluation (Signed)
Occupational Therapy Evaluation Patient Details Name: Andrew [name] MRN: [id number] DOB: 05-Mar-1941 Today's Date: 12/28/2018    History of Present Illness Pt is a 78 y.o. 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  He was admitted for COPD exacerbation.    Clinical Impression   PTA patient reports independent and driving.  Admitted for above and limited by decreased activity tolerance.  Patient educated on safety, precautions, energy conservation techniques and recommendations. Patient will benefit from continued OT services while admitted in order to optimize independence and activity tolerance, review further energy conservation techniques and apply to daily routine.  Patient at supervision level assist for LB ADLs, transfers and functional mobility, requiring cueing for pursed lip breathing and fatigues easily.  Will follow while admitted, anticipate no further needs after dc.     Follow Up Recommendations  No OT follow up    Equipment Recommendations  None recommended by OT    Recommendations for Other Services       Precautions / Restrictions Precautions Precautions: Fall Precaution Comments: on home O2, 2 liters during the day and 3 L at night Restrictions Weight Bearing Restrictions: No      Mobility Bed Mobility Overal bed mobility: Independent                Transfers Overall transfer level: Needs assistance Equipment used: None Transfers: Sit to/from Stand Sit to Stand: Supervision         General transfer comment: supervision for safety     Balance Overall balance assessment: No apparent balance deficits (not formally assessed)                                         ADL either performed or assessed with clinical judgement   ADL Overall ADL's : Needs assistance/impaired     Grooming: Supervision/safety;Standing        Lower Body Bathing: Supervison/ safety;Sit to/from stand   Upper Body Dressing : Set up;Standing   Lower Body Dressing: Supervision/safety;Sit to/from stand   Toilet Transfer: Supervision/safety;Ambulation Toilet Transfer Details (indicate cue type and reason): simulated in room         Functional mobility during ADLs: Supervision/safety General ADL Comments: patient is knowledgable in energy conservation and pursed lip breathing, requiring minimal cueing to follow during funcitonal tasks and fatigue easily      Vision         Perception     Praxis      Pertinent Vitals/Pain Pain Assessment: No/denies pain     Hand Dominance Right   Extremity/Trunk Assessment Upper Extremity Assessment Upper Extremity Assessment: Overall WFL for tasks assessed   Lower Extremity Assessment Lower Extremity Assessment: Overall WFL for tasks assessed   Cervical / Trunk Assessment Cervical / Trunk Assessment: Normal   Communication Communication Communication: HOH   Cognition Arousal/Alertness: Awake/alert Behavior During Therapy: WFL for tasks assessed/performed Overall Cognitive Status: Within Functional Limits for tasks assessed                                     General Comments  patient recieved breathing Tx after returning back to room; pt on 2L ox[place]en via [place] throughout session with oxygen maintain > 92%  Exercises     Shoulder Instructions      Home Living Family/patient expects to be discharged to:: Private residence Living Arrangements: Spouse/significant other;Children(daughters family) Available Help at Discharge: Family;Available 24 hours/day Type of Home: House Home Access: Level entry     Home Layout: One level     Bathroom Shower/Tub: Teacher, early years/pre: Standard     Home Equipment: Shower seat   Additional Comments: oxygen concentrator, CPAP      Prior Functioning/Environment Level of Independence: Independent         Comments: independnet, driving, no IADLs        OT Problem List: Decreased activity tolerance;Cardiopulmonary status limiting activity      OT Treatment/Interventions: Self-care/ADL training;Energy conservation;DME and/or AE instruction;Patient/family education;Balance training;Therapeutic activities    OT Goals(Current goals can be found in the care plan section) Acute Rehab OT Goals Patient Stated Goal: home OT Goal Formulation: With patient Time For Goal Achievement: 01/11/19 Potential to Achieve Goals: Good  OT Frequency: Min 2X/week   Barriers to D/C:            Co-evaluation              AM-PAC OT "6 Clicks" Daily Activity     Outcome Measure Help from another person eating meals?: None Help from another person taking care of personal grooming?: None Help from another person toileting, which includes using toliet, bedpan, or urinal?: None Help from another person bathing (including washing, rinsing, drying)?: None Help from another person to put on and taking off regular upper body clothing?: None Help from another person to put on and taking off regular lower body clothing?: None 6 Click Score: 24   End of Session Equipment Utilized During Treatment: Oxygen(2L) Nurse Communication: Mobility status  Activity Tolerance: Patient tolerated treatment well Patient left: with call bell/phone within reach;Other (comment)(seated EOB with RT in room)  OT Visit Diagnosis: Other (comment)(decreased activity tolerance )                Time: 3354-5625 OT Time Calculation (min): 19 min Charges:  OT General Charges $OT Visit: 1 Visit OT Evaluation $OT Eval Moderate Complexity: [place], OT Acute Rehabilitation Services Pager [phone] Office [phone]   Andrew [name] 12/28/2018, 9:04 AM

## 2018-12-28 NOTE — Care Management Note (Signed)
Case Management Note  Patient Details  Name: Andrew [name] MRN: [id number] Date of Birth: 05-13-1941  Subjective/Objective:                    Action/Plan:  Epic face sheet  information confirmed with patient . Patient lives with his daughter and her family. He has portable oxygen concentrator through [employer]. Daughter plans on transporting him home at discharge. Has oxygen   Expected Discharge Date:                  Expected Discharge Plan:  [place]  In-House Referral:     Discharge planning Services  CM Consult  Post Acute Care Choice:  Home Health Choice offered to:  Patient  DME Arranged:  N/A DME Agency:  NA  HH Arranged:  RN [place] Agency:  [place]  Status of Service:  Completed, signed off  If discussed at [place] of Stay Meetings, dates discussed:    Additional Comments:  Marilu Favre, RN 12/28/2018, 12:28 PM

## 2018-12-28 NOTE — Progress Notes (Signed)
Initial Nutrition Assessment  DOCUMENTATION CODES:   Not applicable  INTERVENTION:   -Continue Ensure Enlive po BID, each supplement provides 350 kcal and 20 grams of protein -MVI with minerals daily  NUTRITION DIAGNOSIS:   Increased nutrient needs related to chronic illness(COPD) as evidenced by estimated needs.  GOAL:   Patient will meet greater than or equal to 90% of their needs  MONITOR:   PO intake, Supplement acceptance, Labs, Weight trends, Skin, I & O's  REASON FOR ASSESSMENT:   Consult, Malnutrition Screening Tool Assessment of nutrition requirement/status  ASSESSMENT:   Andrew [name] is a 78 y.o. 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  Pt admitted for COPD exacerbation.   Reviewed I/O's: -223 ml x24 hours and +117 ml since admission  Pt sleeping soundly at time of visit and did not respond to name being called. No family/caregivers at bedside to obtain further history.   Case discussed with RN, who reports pt did not sleep well last night and requested he not be wakened. Per RN, pt has good appetite and has been accepting on Ensure supplements (consumed all of AM dose). Pt reported to RN that he will consume PM dose later. Documented meal completion 100%.   Reviewed wt hx; noted pt has experienced a 8.1% wt loss over the past 3 months, which is significant for time frame. This is also concerning in the setting or increased nutritional needs for COPD. RD will continue Ensure supplements in attempt to optimize nutritional status.  Medications reviewed and include prednisone.   Labs reviewed.   NUTRITION - FOCUSED PHYSICAL EXAM:    Most Recent Value  Orbital Region  No depletion  Upper Arm Region  Mild depletion  Thoracic and Lumbar Region  Unable to assess  Buccal Region  No depletion  Temple Region  No depletion  Clavicle Bone Region  No  depletion  Clavicle and Acromion Bone Region  No depletion  Scapular Bone Region  No depletion  Dorsal Hand  Mild depletion  Patellar Region  Unable to assess  Anterior Thigh Region  Unable to assess  Posterior Calf Region  Unable to assess  Edema (RD Assessment)  None  Hair  Reviewed  Eyes  Reviewed  Mouth  Reviewed  Skin  Reviewed  Nails  Reviewed       Diet Order:   Diet Order            Diet Heart Room service appropriate? Yes; Fluid consistency: Thin  Diet effective now              EDUCATION NEEDS:   No education needs have been identified at this time  Skin:  Skin Assessment: Skin Integrity Issues: Skin Integrity Issues:: Stage I Stage I: buttocks  Last BM:  12/28/18  Height:   Ht Readings from Last 1 Encounters:  12/27/18 5\' 7"  (1.702 m)    Weight:   Wt Readings from Last 1 Encounters:  12/27/18 57.7 kg    Ideal Body Weight:  61.4 kg  BMI:  Body mass index is 19.92 kg/m.  Estimated Nutritional Needs:   Kcal:  1800-2000  Protein:  90-105 grams  Fluid:  1.8-2.0 L    Jinnie Onley A. Jimmye Norman, RD, LDN, CDE Pager: [phone] After hours Pager: [phone]

## 2018-12-28 NOTE — Progress Notes (Signed)
Patient stated he does not want to wear CPAP tonight. RT informed patient to have RT called if he changes his mind. Patient currently on 2L [place] with no respiratory distress noted. RT will monitor as needed.

## 2018-12-28 NOTE — Progress Notes (Signed)
Patient stated that he takes Pulmicort BID at home and would like that to be added here. MD: could you please order.

## 2018-12-28 NOTE — Progress Notes (Signed)
Physical Therapy Treatment Patient Details Name: Andrew [name] MRN: [id number] DOB: [birth year] Today's Date: 12/28/2018    History of Present Illness Pt is a 78 y.o. 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  He was admitted for COPD exacerbation.     PT Comments    Pt just returned to bed after using bathroom on entry but willing to participate with therapy this afternoon. Pt limited in safe mobility by DoE, and generalized weakness. Pt independent with bed mobility and supervision for transfers and ambulation without AD. Pt with 4/4 DoE requiring standing rest break after 150 feet of ambulation. Pt on 2L O2 via [place] with SaO2 >92% O2 throughout session. Pt reports that he will be d/c'ing home hopefully tomorrow and if not the day after. PT will continue to follow acutely.    Follow Up Recommendations  No PT follow up     Equipment Recommendations  None recommended by PT       Precautions / Restrictions Precautions Precautions: Fall Precaution Comments: on home O2, 2 liters during the day and 3 L at night Restrictions Weight Bearing Restrictions: No    Mobility  Bed Mobility Overal bed mobility: Independent                Transfers Overall transfer level: Needs assistance Equipment used: None Transfers: Sit to/from [employer] Transfers Sit to Stand: Supervision Stand pivot transfers: Supervision       General transfer comment: supervision for safety  Ambulation/Gait Ambulation/Gait assistance: Supervision Gait Distance (Feet): 350 Feet Assistive device: None Gait Pattern/deviations: WFL(Within Functional Limits) Gait velocity: decreased Gait velocity interpretation: 1.31 - 2.62 ft/sec, indicative of limited community ambulator General Gait Details: requires 1x standing rest break due to 4/4 DoE, SpO2 95% on 2 L continuous O2       Balance  Overall balance assessment: No apparent balance deficits (not formally assessed)                                          Cognition Arousal/Alertness: Awake/alert Behavior During Therapy: WFL for tasks assessed/performed Overall Cognitive Status: Within Functional Limits for tasks assessed                                           General Comments General comments (skin integrity, edema, etc.): Pt on 2L O2 via [place] on entry with SaO2 99%O2, dropped to 95%O2 with ambulation      Pertinent Vitals/Pain Pain Assessment: No/denies pain           PT Goals (current goals can now be found in the care plan section) Acute Rehab PT Goals Patient Stated Goal: home PT Goal Formulation: With patient Time For Goal Achievement: 01/10/19 Potential to Achieve Goals: Good Progress towards PT goals: Progressing toward goals    Frequency    Min 3X/week      PT Plan Current plan remains appropriate       AM-PAC PT "6 Clicks" Mobility   Outcome Measure  Help needed turning from your back to your side while in a flat bed without using bedrails?: None Help needed moving from lying on your back to sitting on the side of a  flat bed without using bedrails?: None Help needed moving to and from a bed to a chair (including a wheelchair)?: A Little Help needed standing up from a chair using your arms (e.g., wheelchair or bedside chair)?: None Help needed to walk in hospital room?: A Little Help needed climbing 3-5 steps with a railing? : A Little 6 Click Score: 21    End of Session Equipment Utilized During Treatment: Oxygen Activity Tolerance: Treatment limited secondary to medical complications (Comment)(DOE) Patient left: in bed;with call bell/phone within reach Nurse Communication: Mobility status PT Visit Diagnosis: Difficulty in walking, not elsewhere classified (R26.2)     Time: 0630-1601 PT Time Calculation (min) (ACUTE ONLY): 14 min  Charges:   $Gait Training: 8-22 mins                     Noeh Sparacino B. Migdalia Dk PT, DPT Acute Rehabilitation Services Pager [phone] Office [phone]    [place] 12/28/2018, 4:54 PM

## 2018-12-28 NOTE — Progress Notes (Signed)
   12/28/18 1100  Clinical Encounter Type  Visited With Patient  Visit Type Initial;Spiritual support  Referral From Nurse  Consult/Referral To Chaplain  Spiritual Encounters  Spiritual Needs Emotional;Other (Comment) (life questions related to spirituality)  Stress Factors  Patient Stress Factors Family relationships   Met w/ pt in response to Epic consult request that pt wanted to speak to someone. 2RNs present when I entered, but they left shortly thereafter with plans to return.  Introduced self, pt attempted to talk about his concerns with me but stated that he felt too embarrassed and would prefer to speak to a male chaplain.  I explained that all the chaplains present today are women, but one of the male chaplains likely would be present tomorrow morning and I could pass the request along.  Pt appreciated that and I will brief my colleague(s) about that when making the request.  Myra Gianotti resident, [phone]

## 2018-12-28 NOTE — Progress Notes (Signed)
[facility] PROGRESS NOTE  Andrew [name] OAC:[id number] DOB: Feb 11, 1941 DOA: 12/27/2018 PCP: Lujean Amel, MD  Assessment/Plan: 1. COPD exacerbation, improving.  Currently on home oxygen regimen with improvement in wheezing on exam and no dyspnea at rest.  Given patient is on prolonged steroids will continue IV cefepime due to concern for pseudomonal involvement.  RVP negative.  IV Solu-Medrol discontinued, starting on prednisone 40 would like to continue to monitor for additional 24 hours given patient readmitted in the setting of prednisone taper.  Scheduled duo nebs every 6 hours, resume home Pulmicort, PRN albuterol nebs monitor 2. A. fib, rate controlled.  Continue home diltiazem and Eliquis.  Scheduled Mucinex, add flutter valve, and continues respiratory 3. BPH, stable continue finasteride hyperlipidemia, stable continue Crestor   Code Status: FULL Family Communication:  none  Disposition Plan: We will continue to monitor to see improvement while on IV cefepime, extubate discharge next 24-48 hours   Consultants:  none  Procedures:  None  Antibiotics:  IV cefepime (2/9)  IV Levaquin (2/9)  HPI/Subjective:  Andrew [name] is a 78 y.o. 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  He states after completing taper earlier this week he noticed worsening dyspnea on exertion (minimal), increased cough productive of yellow sputum.  His PCP put him on a new prednisone taper starting at 60 mg on 2/7.  He seemed to be doing better also the evening of 2/8 noticed increase shortness of breath and improving with increased use of his home duo nebs and scheduled Pulmicort and Spiriva.  He typically uses 3 L of oxygen at home he called EMS who  increased him to 6 L with some improvement in his breathing. Since being in the hospital, chest x-ray was negative for any pneumonia.  He has been on IV Levaquin then transition to IV cefepime for pseudomonal coverage related to COPD flare and continued on IV Solu-Medrol and scheduled inhalers with great improvement in his breathing.    This a.m. reports continued improvement in cough and shortness of breath.  Denies any fevers or chills. Objective: Vitals:   12/28/18 1957 12/28/18 2109  BP:  (!) 112/54  Pulse:  75  Resp:  19  Temp:  97.8 F (36.6 C)  SpO2: 96% 96%    Intake/Output Summary (Last 24 hours) at 12/28/2018 2131 Last data filed at 12/28/2018 1700 Gross per 24 hour  Intake 1520 ml  Output 1825 ml  Net -305 ml   Filed Weights   12/27/18 0022 12/27/18 0438  Weight: 57 kg 57.7 kg    Exam:   General: Lying in bed in no distress  Cardiovascular: IRR, no edema, no murmurs, rubs or gallops  Respiratory: on 3 L diminished breath sounds with normal respiratory effort, no wheezing or crackles  Abdomen: Soft nondistended nontender  Musculoskeletal: Normal range of motion  Skin no rashes or lesions  Neurologic alert and oriented x4, no appreciable focal deficits  Data Reviewed: Basic Metabolic Panel: Recent Labs  Lab 12/27/18 0104 12/27/18 0119 12/28/18 0720  NA 141 139 139  K 4.8 4.8 4.3  CL 105  --  100  CO2 25  --  27  GLUCOSE 117*  --  135*  BUN 24*  --  24*  CREATININE 0.88  --  1.08  CALCIUM  9.0  --  8.8*   Liver Function Tests: Recent Labs  Lab 12/27/18 0104  AST 22  ALT 42  ALKPHOS 57  BILITOT 0.7  PROT 6.0*  ALBUMIN 3.6   No results for input(s): LIPASE, AMYLASE in the last 168 hours. No results for input(s): AMMONIA in the last 168 hours. CBC: Recent Labs  Lab 12/27/18 0104 12/27/18 0119 12/28/18 0720  WBC 21.3*  --  11.6*  NEUTROABS 20.5*  --   --   HGB 14.4 14.3 13.4  HCT 44.5 42.0 40.8  MCV 101.4*  --  99.8  PLT 214  --   173   Cardiac Enzymes: Recent Labs  Lab 12/27/18 0104  TROPONINI <0.03   BNP (last 3 results) Recent Labs    12/27/18 0104  BNP 51.4    ProBNP (last 3 results) Recent Labs    09/28/18 1242  PROBNP 23.0    CBG: No results for input(s): GLUCAP in the last 168 hours.  Recent Results (from the past 240 hour(s))  Respiratory Panel by PCR     Status: None   Collection Time: 12/27/18 11:39 AM  Result Value Ref Range Status   Adenovirus NOT DETECTED NOT DETECTED Final   Coronavirus 229E NOT DETECTED NOT DETECTED Final    Comment: (NOTE) The Coronavirus on the Respiratory Panel, DOES NOT test for the novel  Coronavirus (2019 nCoV)    Coronavirus HKU1 NOT DETECTED NOT DETECTED Final   Coronavirus NL63 NOT DETECTED NOT DETECTED Final   Coronavirus OC43 NOT DETECTED NOT DETECTED Final   Metapneumovirus NOT DETECTED NOT DETECTED Final   Rhinovirus / Enterovirus NOT DETECTED NOT DETECTED Final   Influenza A NOT DETECTED NOT DETECTED Final   Influenza B NOT DETECTED NOT DETECTED Final   Parainfluenza Virus 1 NOT DETECTED NOT DETECTED Final   Parainfluenza Virus 2 NOT DETECTED NOT DETECTED Final   Parainfluenza Virus 3 NOT DETECTED NOT DETECTED Final   Parainfluenza Virus 4 NOT DETECTED NOT DETECTED Final   Respiratory Syncytial Virus NOT DETECTED NOT DETECTED Final   Bordetella pertussis NOT DETECTED NOT DETECTED Final   Chlamydophila pneumoniae NOT DETECTED NOT DETECTED Final   Mycoplasma pneumoniae NOT DETECTED NOT DETECTED Final    Comment: Performed at [facility], [place]. [address] [place], [place] [address]     Studies: Dg Chest Port 1 View  Result Date: 12/27/2018 CLINICAL DATA:  Respiratory distress. COPD. EXAM: PORTABLE CHEST 1 VIEW COMPARISON:  12/13/2018 chest radiograph. FINDINGS: Stable cardiomediastinal silhouette with normal heart size. No pneumothorax. No pleural effusion. Hyperinflated lungs and advanced emphysema. No acute consolidative airspace disease.  No pulmonary edema. IMPRESSION: 1. No acute cardiopulmonary disease. 2. Hyperinflated lungs and advanced emphysema, compatible with the provided history of COPD. Electronically Signed   By: Ilona Sorrel M.D.   On: 12/27/2018 01:17    Scheduled Meds: . apixaban  5 mg Oral BID  . budesonide  0.5 mg Nebulization BID  . diltiazem  180 mg Oral Daily  . feeding supplement (ENSURE ENLIVE)  237 mL Oral BID BM  . finasteride  5 mg Oral QHS  . fluticasone  1 spray Each Nare QHS  . guaiFENesin  600 mg Oral BID  . ipratropium-albuterol  3 mL Nebulization TID  . mouth rinse  15 mL Mouth Rinse BID  . multivitamin with minerals  1 tablet Oral Daily  . nystatin  5 mL Oral QID  . pantoprazole  80 mg Oral BID  . polyethylene glycol  17 g Oral QHS  . predniSONE  40 mg Oral Q breakfast  . rosuvastatin  10 mg Oral Daily  . sildenafil  20 mg Oral BID  . tamsulosin  0.4 mg Oral QPC breakfast  . temazepam  30 mg Oral QHS   Continuous Infusions: . ceFEPime (MAXIPIME) IV 1 g (12/28/18 2117)    Principal Problem:   COPD with acute exacerbation ([place]) Active Problems:   Chronic hypoxemic respiratory failure (HCC)   History of pulmonary hypertension   Stage 3 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 (HCC)   Paroxysmal atrial fibrillation (HCC)   OSA on CPAP   Current chronic use of systemic steroids      Desiree Hane  [facility]

## 2018-12-29 MED ORDER — LEVOFLOXACIN 750 MG PO TABS
750.0000 mg | ORAL_TABLET | Freq: Every day | ORAL | Status: AC
  Administered 2018-12-29 – 2018-12-30 (×2): 750 mg via ORAL
  Filled 2018-12-29 (×2): qty 1

## 2018-12-29 NOTE — Progress Notes (Signed)
[facility] PROGRESS NOTE  Manson Luckadoo UTM:[id number] DOB: 08-28-1941 DOA: 12/27/2018 PCP: Lujean Amel, MD  Assessment/Plan: 1. COPD exacerbation, improving.  Currently on home oxygen regimen with improvement in wheezing on exam and no dyspnea at rest.  Given patient is on prolonged steroids was previously on IV cefepime due to potential concern for pseudomonal involvement.  RVP is negative.  Has done well on prednisone 40.  Would like to transition to Levaquin to continue pseudomonal coverage and monitor the next 24 hours (specifically given patient is a recent readmitted with similar presentation unable to tolerate taper off steroids)  Scheduled duo nebs every 6 hours, home Pulmicort, PRN albuterol nebs monitor  2. A. fib, rate controlled.  Continue home diltiazem and Eliquis.  Scheduled Mucinex, add flutter valve, and continues respiratory  3. BPH, stable continue finasteride hyperlipidemia, stable continue Crestor   Code Status: FULL Family Communication:  none  Disposition Plan: Anticipate discharge in next 24 hours if patient remains stable clinically with transition from IV cefepime to fluoroquinolone   Consultants:  none  Procedures:  None  Antibiotics:  IV cefepime (2/9-2/11)  PO Levaquin (2/11)  HPI/Subjective:  Magnus Crescenzo is a 78 y.o. 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  He states after completing taper earlier this week he noticed worsening dyspnea on exertion (minimal), increased cough productive of yellow sputum.  His PCP put him on a new prednisone taper starting at 60 mg on 2/7.  He seemed to be doing better also the evening of 2/8 noticed increase shortness of breath and improving with increased use of  his home duo nebs and scheduled Pulmicort and Spiriva.  He typically uses 3 L of oxygen at home he called EMS who increased him to 6 L with some improvement in his breathing. Since being in the hospital, chest x-ray was negative for any pneumonia.  He has been on IV Levaquin then transition to IV cefepime for pseudomonal coverage related to COPD flare and continued on IV Solu-Medrol and scheduled inhalers with great improvement in his breathing.    This a.m. reports feels breathing is doing pretty well.  Still having cough but mildly productive.  No fevers or chills. Objective: Vitals:   12/29/18 1448 12/29/18 1617  BP: 138/68   Pulse: 89   Resp: 18   Temp: 98 F (36.7 C)   SpO2: 96% 97%    Intake/Output Summary (Last 24 hours) at 12/29/2018 1651 Last data filed at 12/29/2018 5681 Gross per 24 hour  Intake 762 ml  Output 1150 ml  Net -388 ml   Filed Weights   12/27/18 0022 12/27/18 0438  Weight: 57 kg 57.7 kg    Exam:   General: Elderly male, in no distress  Cardiovascular: IRR, no edema, no murmurs, rubs or gallops  Respiratory: on 2 L diminished breath sounds diffusely with normal respiratory effort, no wheezing or crackles  Abdomen: Soft nondistended nontender  Musculoskeletal: Normal range of motion  Skin no rashes or lesions  Neurologic alert and oriented x4, no appreciable focal deficits  Data Reviewed: Basic Metabolic Panel: Recent Labs  Lab 12/27/18 0104 12/27/18 0119 12/28/18 0720  NA 141 139 139  K 4.8 4.8 4.3  CL 105  --  100  CO2 25  --  27  GLUCOSE  117*  --  135*  BUN 24*  --  24*  CREATININE 0.88  --  1.08  CALCIUM 9.0  --  8.8*   Liver Function Tests: Recent Labs  Lab 12/27/18 0104  AST 22  ALT 42  ALKPHOS 57  BILITOT 0.7  PROT 6.0*  ALBUMIN 3.6   No results for input(s): LIPASE, AMYLASE in the last 168 hours. No results for input(s): AMMONIA in the last 168 hours. CBC: Recent Labs  Lab 12/27/18 0104 12/27/18 0119  12/28/18 0720  WBC 21.3*  --  11.6*  NEUTROABS 20.5*  --   --   HGB 14.4 14.3 13.4  HCT 44.5 42.0 40.8  MCV 101.4*  --  99.8  PLT 214  --  173   Cardiac Enzymes: Recent Labs  Lab 12/27/18 0104  TROPONINI <0.03   BNP (last 3 results) Recent Labs    12/27/18 0104  BNP 51.4    ProBNP (last 3 results) Recent Labs    09/28/18 1242  PROBNP 23.0    CBG: No results for input(s): GLUCAP in the last 168 hours.  Recent Results (from the past 240 hour(s))  Respiratory Panel by PCR     Status: None   Collection Time: 12/27/18 11:39 AM  Result Value Ref Range Status   Adenovirus NOT DETECTED NOT DETECTED Final   Coronavirus 229E NOT DETECTED NOT DETECTED Final    Comment: (NOTE) The Coronavirus on the Respiratory Panel, DOES NOT test for the novel  Coronavirus (2019 nCoV)    Coronavirus HKU1 NOT DETECTED NOT DETECTED Final   Coronavirus NL63 NOT DETECTED NOT DETECTED Final   Coronavirus OC43 NOT DETECTED NOT DETECTED Final   Metapneumovirus NOT DETECTED NOT DETECTED Final   Rhinovirus / Enterovirus NOT DETECTED NOT DETECTED Final   Influenza A NOT DETECTED NOT DETECTED Final   Influenza B NOT DETECTED NOT DETECTED Final   Parainfluenza Virus 1 NOT DETECTED NOT DETECTED Final   Parainfluenza Virus 2 NOT DETECTED NOT DETECTED Final   Parainfluenza Virus 3 NOT DETECTED NOT DETECTED Final   Parainfluenza Virus 4 NOT DETECTED NOT DETECTED Final   Respiratory Syncytial Virus NOT DETECTED NOT DETECTED Final   Bordetella pertussis NOT DETECTED NOT DETECTED Final   Chlamydophila pneumoniae NOT DETECTED NOT DETECTED Final   Mycoplasma pneumoniae NOT DETECTED NOT DETECTED Final    Comment: Performed at [facility], [place]. [address], [place], [place] [address]     Studies: No results found.  Scheduled Meds: . apixaban  5 mg Oral BID  . budesonide  0.5 mg Nebulization BID  . diltiazem  180 mg Oral Daily  . feeding supplement (ENSURE ENLIVE)  237 mL Oral BID BM  .  finasteride  5 mg Oral QHS  . fluticasone  1 spray Each Nare QHS  . guaiFENesin  600 mg Oral BID  . ipratropium-albuterol  3 mL Nebulization TID  . levofloxacin  750 mg Oral Daily  . mouth rinse  15 mL Mouth Rinse BID  . multivitamin with minerals  1 tablet Oral Daily  . nystatin  5 mL Oral QID  . pantoprazole  80 mg Oral BID  . polyethylene glycol  17 g Oral QHS  . predniSONE  40 mg Oral Q breakfast  . rosuvastatin  10 mg Oral Daily  . sildenafil  20 mg Oral BID  . tamsulosin  0.4 mg Oral QPC breakfast  . temazepam  30 mg Oral QHS   Continuous Infusions:   Principal  Problem:   COPD with acute exacerbation (HCC) Active Problems:   Chronic hypoxemic respiratory failure (HCC)   History of pulmonary hypertension   Stage 3 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 (HCC)   Paroxysmal atrial fibrillation (HCC)   OSA on CPAP   Current chronic use of systemic steroids      Desiree Hane  [facility]

## 2018-12-29 NOTE — Progress Notes (Signed)
Occupational Therapy Treatment and Discharge  Patient Details Name: Andrew [name] MRN: [id number] DOB: 1941-07-03 Today's Date: 12/29/2018    History of present illness Pt is a 78 y.o. 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  He was admitted for COPD exacerbation.    OT comments  Patient agreeable to OT.  Improving activity tolerance, completing functional mobility and sustained grooming tasks standing at sink without rest breaks.  On 2L supplemental oxygen via [place] and oxygen dropping to 85% briefly (when using UEs, with poor signal) returning to 98% quickly after ADLs completed. Patient verbalized understanding with energy conservation techniques, and thankful for handout to review.  At this time, all OT goals have been met.  OT signing off.    Follow Up Recommendations  No OT follow up    Equipment Recommendations  None recommended by OT    Recommendations for Other Services      Precautions / Restrictions Precautions Precautions: Fall Precaution Comments: on home O2, 2 liters during the day and 3 L at night Restrictions Weight Bearing Restrictions: No       Mobility Bed Mobility Overal bed mobility: Independent                Transfers Overall transfer level: Modified independent     Sit to Stand: Modified independent (Device/Increase time)              Balance Overall balance assessment: No apparent balance deficits (not formally assessed)                                         ADL either performed or assessed with clinical judgement   ADL Overall ADL's : Modified independent     Grooming: Modified independent;Wash/dry hands;Wash/dry face;Brushing hair Grooming Details (indicate cue type and reason): good tolerance completing standing grooming tasks                  Toilet Transfer: Modified  Independent;Ambulation Toilet Transfer Details (indicate cue type and reason): simulated in room         Functional mobility during ADLs: Modified independent General ADL Comments: provided energy conservation handout and reviewed techniques to maximize independence and return to [facility]      Vision       Perception     Praxis      Cognition Arousal/Alertness: Awake/alert Behavior During Therapy: WFL for tasks assessed/performed Overall Cognitive Status: Within Functional Limits for tasks assessed                                          Exercises     Shoulder Instructions       General Comments Pt on 2L oxygen via, sustained standing acitivites at sink dropped to 85% (believe unclear reading) as returned to 98% quickly    Pertinent Vitals/ Pain       Pain Assessment: No/denies pain  Home Living                                          Prior Functioning/Environment  Frequency  Min 2X/week        Progress Toward Goals  OT Goals(current goals can now be found in the care plan section)  Progress towards OT goals: Goals met/education completed, patient discharged from OT  Acute Rehab OT Goals Patient Stated Goal: home OT Goal Formulation: With patient Time For Goal Achievement: 01/11/19 Potential to Achieve Goals: Good  Plan All goals met and education completed, patient discharged from OT services    Co-evaluation                 AM-PAC OT "6 Clicks" Daily Activity     Outcome Measure   Help from another person eating meals?: None Help from another person taking care of personal grooming?: None Help from another person toileting, which includes using toliet, bedpan, or urinal?: None Help from another person bathing (including washing, rinsing, drying)?: None Help from another person to put on and taking off regular upper body clothing?: None Help from another person to put on and taking off  regular lower body clothing?: None 6 Click Score: 24    End of Session Equipment Utilized During Treatment: Oxygen(2L)  OT Visit Diagnosis: (decreased activity tolerance)   Activity Tolerance Patient tolerated treatment well   Patient Left with call bell/phone within reach;with family/visitor present   Nurse Communication          Time: 1120-1135 OT Time Calculation (min): 15 min  Charges: OT General Charges $OT Visit: 1 Visit OT Treatments $Self Care/Home Management : 8-22 mins  Christie S Burns, OT Acute Rehabilitation Services Pager [phone] Office [phone]    Christie S Burns 12/29/2018, 11:44 AM    

## 2018-12-29 NOTE — Progress Notes (Signed)
PT Cancellation Note  Patient Details Name: Andrew [name] MRN: [id number] DOB: 15-Jan-1941   Cancelled Treatment:    Reason Eval/Treat Not Completed: (P) Medical issues which prohibited therapy Pt is awaiting breathing treatment and wants to wait until after. PT will check back as able.   Febe Champa B. Migdalia Dk PT, DPT Acute Rehabilitation Services Pager [phone] Office [phone]    [place] 12/29/2018, 5:11 PM

## 2018-12-29 NOTE — Consult Note (Signed)
[facility] Primary Care Navigator  12/29/2018  Andrew [name] 12-Jan-1941 [id number]   Met withpatientatthe bedside toidentify possible discharge needs.  Patient reports having "difficulty breathing even with oxygen and increased coughing with productive yellow sputum" that had led to this admission.(chronic hypoxemic respiratory failure, COPD with acute exacerbation- Stage 3 COPD GOLD)  Patientendorses Andrew [name] with [place] [facility] care provider.   [facility] [place] [facility] (pres[facility]) toobtain medications without difficultyso far.   Patienthas [facility] of "pill box" system filled weekly.  Patient verbalized that he has beendriving prior to admission but daughter Andrew [name]) will be able to provide transportation to his doctors' appointments if needed after discharge.  Patientreportsthat he lives with his daughter and family and his daughter serves as his primary caregiver at home.  According to patient, anticipated dischargeplan is home with home health services.  Patientvoiced understandingto callprimary care provider's office whenhereturnshome,for a post discharge follow-up visitwithin1- 2 weeksor sooner if needs arise.Patient letter (with PCP's contact number) was provided asareminder.  Explained topatientregarding [facility] CM services available for health management and resources at home buthe indicated not needing any services for now since he is still able to manage his health conditions so far with daughter's help. Went over COPD action plan with patient using teach back method. Patient states that was exactly what he did prior to being admitted. Patient reports managing his health conditions in the last 20 years (mainly COPD).  Patient however, had only opted and verbally agreedfor  EMMI COPD calls to follow-up withhisrecovery at home.  Referral made for EMMI COPD calls after discharge.  Patientexpressedunderstandingto seek referral from primary care provider to [facility] care management ifdeemed necessary and appropriatefor anyservicesin the nearfuture.   [facility] care management information was provided for future needs thatpatientmay have.  Primary care provider's office is listed as providing transition of care (TOC) follow-up.   For additional questions please contact:  Andrew [name], BSN, RN-BC [facility] PRIMARY CARE Navigator Cell: [phone]

## 2018-12-29 NOTE — Progress Notes (Signed)
Patient is refusing CPAP tonight. Patient states it didn't work out for him last night and this is his last night here. RT removed CPAP from room.

## 2018-12-30 ENCOUNTER — Other Ambulatory Visit: Payer: Self-pay

## 2018-12-30 LAB — CBC WITH DIFFERENTIAL/PLATELET
Abs Immature Granulocytes: 0.04 10*3/uL (ref 0.00–0.07)
Basophils Absolute: 0 10*3/uL (ref 0.0–0.1)
Basophils Relative: 0 %
EOS PCT: 0 %
Eosinophils Absolute: 0 10*3/uL (ref 0.0–0.5)
HCT: 43 % (ref 39.0–52.0)
Hemoglobin: 14.4 g/dL (ref 13.0–17.0)
Immature Granulocytes: 1 %
Lymphocytes Relative: 8 %
Lymphs Abs: 0.7 10*3/uL (ref 0.7–4.0)
MCH: 33.3 pg (ref 26.0–34.0)
MCHC: 33.5 g/dL (ref 30.0–36.0)
MCV: 99.5 fL (ref 80.0–100.0)
Monocytes Absolute: 0.4 10*3/uL (ref 0.1–1.0)
Monocytes Relative: 5 %
Neutro Abs: 7.3 10*3/uL (ref 1.7–7.7)
Neutrophils Relative %: 86 %
Platelets: 151 10*3/uL (ref 150–400)
RBC: 4.32 MIL/uL (ref 4.22–5.81)
RDW: 12.5 % (ref 11.5–15.5)
WBC: 8.4 10*3/uL (ref 4.0–10.5)
nRBC: 0 % (ref 0.0–0.2)

## 2018-12-30 LAB — COMPREHENSIVE METABOLIC PANEL
ALT: 33 U/L (ref 0–44)
AST: 17 U/L (ref 15–41)
Albumin: 3 g/dL — ABNORMAL LOW (ref 3.5–5.0)
Alkaline Phosphatase: 45 U/L (ref 38–126)
Anion gap: 11 (ref 5–15)
BUN: 27 mg/dL — ABNORMAL HIGH (ref 8–23)
CO2: 30 mmol/L (ref 22–32)
Calcium: 8.8 mg/dL — ABNORMAL LOW (ref 8.9–10.3)
Chloride: 97 mmol/L — ABNORMAL LOW (ref 98–111)
Creatinine, Ser: 1.08 mg/dL (ref 0.61–1.24)
GFR calc Af Amer: >60 mL/min (ref >60)
GFR calc non Af Amer: >60 mL/min (ref >60)
Glucose, Bld: 168 mg/dL — ABNORMAL HIGH (ref 70–99)
Potassium: 3.7 mmol/L (ref 3.5–5.1)
Sodium: 138 mmol/L (ref 135–145)
TOTAL PROTEIN: 5.3 g/dL — AB (ref 6.5–8.1)
Total Bilirubin: 0.7 mg/dL (ref 0.3–1.2)

## 2018-12-30 LAB — PHOSPHORUS: Phosphorus: 2.5 mg/dL (ref 2.5–4.6)

## 2018-12-30 LAB — MAGNESIUM: Magnesium: 2.2 mg/dL (ref 1.7–2.4)

## 2018-12-30 MED ORDER — BUDESONIDE-FORMOTEROL FUMARATE 160-4.5 MCG/ACT IN AERO: 2.0000 | INHALATION_SPRAY | Freq: Two times a day (BID) | RESPIRATORY_TRACT | 0 refills | Status: DC

## 2018-12-30 MED ORDER — BUDESONIDE 0.5 MG/2ML IN SUSP: 0.5000 mg | Freq: Two times a day (BID) | RESPIRATORY_TRACT | 0 refills | Status: DC

## 2018-12-30 MED ORDER — IPRATROPIUM-ALBUTEROL 0.5-2.5 (3) MG/3ML IN SOLN: 3.0000 mL | Freq: Three times a day (TID) | RESPIRATORY_TRACT | 0 refills | Status: DC

## 2018-12-30 MED ORDER — ENSURE ENLIVE PO LIQD: 237.0000 mL | Freq: Two times a day (BID) | ORAL | 12 refills | Status: DC

## 2018-12-30 MED ORDER — PREDNISONE 10 MG (21) PO TBPK: ORAL_TABLET | ORAL | 0 refills | Status: DC

## 2018-12-30 NOTE — Discharge Summary (Signed)
Physician Discharge Summary  Andrew [name] IEP:[id number] DOB: 03-Apr-1941 DOA: 12/27/2018  PCP: Lujean Amel, MD  Admit date: 12/27/2018 Discharge date: 12/30/2018  Admitted From: Home Disposition: Home  Recommendations for Outpatient Follow-up:  1. Follow up with PCP in 1-2 weeks 2. Follow up with Pulmonary Dr. Leory Plowman Icard within 1 week; ointment scheduled for 01/07/2019 3. Please obtain CMP/CBC, Mag, Phos in one week 4. Repeat CXR in 3-6 weeks 5. Please follow up on the following pending results:  Home Health: No  Equipment/Devices: None    Discharge Condition: Stable CODE STATUS: FULL CODE Diet recommendation: Heart Healthy Diet   Brief/Interim Summary: Andrew [name] is a 78 y.o. 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    He states after completing taper earlier this week he noticed worsening dyspnea on exertion (minimal), increased cough productive of yellow sputum.  His PCP put him on a new prednisone taper starting at 60 mg on 2/7.  He seemed to be doing better also the evening of 2/8 noticed increase shortness of breath and improving with increased use of his home duo nebs and scheduled Pulmicort and Spiriva.  He typically uses 3 L of oxygen at home he called EMS who increased him to 6 L with some improvement in his breathing. Since being in the hospital, chest x-ray was negative for any pneumonia.  He has been on IV Levaquin then transition to IV cefepime for pseudomonal coverage related to COPD flare and then transitioned back to Levofloxacin; He was continued on IV Solu-Medrol and scheduled inhalers with great improvement in his breathing and transitioned to po Prednisone and D/C'd on a Sterapred Pack  She does feel that his  breathing is back to baseline so he is discharged home as PT recommended to follow-up.  He will follow-up with his primary care physician as well as his pulmonologist Dr. Leory Plowman Icard within 1 week.   Discharge Diagnoses:  Principal Problem:   COPD with acute exacerbation ([place]) Active Problems:   Chronic hypoxemic respiratory failure (HCC)   History of pulmonary hypertension   Stage 3 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 (HCC)   Paroxysmal atrial fibrillation (HCC)   OSA on CPAP   Current chronic use of systemic steroids  COPD exacerbation, improving.   -Currently on home oxygen regimen with improvement in wheezing on exam and no dyspnea at rest.   -Given patient is on prolonged steroids was previously on IV cefepime due to potential concern for pseudomonal involvement.   -RVP is negative.   -Has done well on prednisone 40 mg.   -Transitioned to Levaquin to continue pseudomonal coverage and monitored for the last 24 hours (specifically given patient is a recent readmitted with similar presentation unable to tolerate taper off steroids) and he is back to baseline -Abx course finisehd  -C/w Scheduled duo nebs every 6 hours, home Pulmicort, PRN albuterol nebs monitor -Scheduled Mucinex, add flutter valve, and continue Incentive Spirometry   A. fib, rate controlled.   -Continue home diltiazem and Eliquis.    BPH -Stable  -continue Finasteride   Hyperlipidemia stable continue Crestor  Elevated BUN -BUN was 27 creatinine is 1.08 -Dissociation likely in the setting of steroid usage -Continue monitor repeat CMP patient  Hyperglycemia -In setting of steroid usage -Continue monitor and have PCP reevaluate and  address if consistently remaining high  Discharge Instructions  Discharge Instructions    Call MD for:  difficulty breathing, headache or visual disturbances   Complete by:  As directed    Call MD for:  extreme fatigue   Complete by:  As directed    Call MD for:  hives    Complete by:  As directed    Call MD for:  persistant dizziness or light-headedness   Complete by:  As directed    Call MD for:  persistant nausea and vomiting   Complete by:  As directed    Call MD for:  redness, tenderness, or signs of infection (pain, swelling, redness, odor or green/yellow discharge around incision site)   Complete by:  As directed    Call MD for:  severe uncontrolled pain   Complete by:  As directed    Call MD for:  temperature >100.4   Complete by:  As directed    Diet - low sodium heart healthy   Complete by:  As directed    Increase activity slowly   Complete by:  As directed      Allergies as of 12/30/2018   No Known Allergies     Medication List    STOP taking these medications   predniSONE 10 MG tablet Commonly known as:  DELTASONE Replaced by:  predniSONE 10 MG (21) Tbpk tablet     TAKE these medications   AEROCHAMBER MV inhaler Use as instructed   albuterol 108 (90 Base) MCG/ACT inhaler Commonly known as:  PROVENTIL HFA;VENTOLIN HFA Inhale 2 puffs into the lungs every 6 (six) hours as needed for wheezing or shortness of breath.   apixaban 5 MG Tabs tablet Commonly known as:  ELIQUIS Take 1 tablet (5 mg total) by mouth 2 (two) times daily.   budesonide 0.5 MG/2ML nebulizer solution Commonly known as:  PULMICORT Take 2 mLs (0.5 mg total) by nebulization 2 (two) times daily.   budesonide-formoterol 160-4.5 MCG/ACT inhaler Commonly known as:  SYMBICORT Inhale 2 puffs into the lungs every 12 (twelve) hours.   CRITIC-AID CLEAR AF 2 % Oint Generic drug:  Miconazole Nitrate Apply 1 application topically at bedtime.   diltiazem 180 MG 24 hr capsule Commonly known as:  CARDIZEM CD TAKE 1 CAPSULE EVERY DAY   feeding supplement (ENSURE ENLIVE) Liqd Take 237 mLs by mouth 2 (two) times daily between meals.   finasteride 5 MG tablet Commonly known as:  PROSCAR Take 5 mg by mouth at bedtime.   fluticasone 50 MCG/ACT nasal spray Commonly  known as:  FLONASE Place 1 spray into both nostrils at bedtime.   guaiFENesin 600 MG 12 hr tablet Commonly known as:  MUCINEX Take 1 tablet (600 mg total) by mouth 2 (two) times daily.   hydrocortisone cream 1 % Apply 1 application topically daily as needed (Eczema on face and head).   ipratropium-albuterol 0.5-2.5 (3) MG/3ML Soln Commonly known as:  DUONEB Take 3 mLs by nebulization 3 (three) times daily.   IRON PO Take 1 tablet by mouth 3 (three) times a week.   liver oil-zinc oxide 40 % ointment Commonly known as:  DESITIN Apply 1 application topically daily.   multivitamin with minerals tablet Take 1 tablet by mouth daily.   nystatin 100000 UNIT/ML suspension Commonly known as:  MYCOSTATIN Take 6 mLs by mouth 4 (four) times daily.   omeprazole 40 MG capsule Commonly known as:  PRILOSEC Take 40 mg by mouth 2 (two) times daily.  OXYGEN Inhale 2-3 L into the lungs See admin instructions. 2L daytime, 3L at night   polyethylene glycol packet Commonly known as:  MIRALAX / GLYCOLAX Take 17 g by mouth daily. What changed:    when to take this  additional instructions   predniSONE 10 MG (21) Tbpk tablet Commonly known as:  STERAPRED UNI-PAK 21 TAB Take 6 Tablets Day 1, 5 Tablets Day 2, 4 Tablets Day 3, 3 Tablets Day 4, 2 Tablets Day 5, 1 Tablet Day 6, and Stop Day 7 Replaces:  predniSONE 10 MG tablet   rosuvastatin 10 MG tablet Commonly known as:  CRESTOR Take 10 mg by mouth daily.   sildenafil 20 MG tablet Commonly known as:  REVATIO Take 1 tablet (20 mg total) by mouth 2 (two) times daily.   SPIRIVA HANDIHALER 18 MCG inhalation capsule Generic drug:  tiotropium INHALE THE CONTENTS OF 1 CAPSULE EVERY DAY What changed:    how much to take  how to take this  when to take this  additional instructions   tamsulosin 0.4 MG Caps capsule Commonly known as:  FLOMAX Take 1 capsule (0.4 mg total) by mouth daily after breakfast.   temazepam 30 MG  capsule Commonly known as:  RESTORIL Take 1 capsule (30 mg total) by mouth at bedtime.   vitamin C 500 MG tablet Commonly known as:  ASCORBIC ACID Take 500 mg by mouth 2 (two) times daily.   VITAMIN D PO Take 1 tablet by mouth daily.      [facility] Follow up.   Contact information: [place] [address] [phone]          No Known Allergies  Consultations:  None   Procedures/Studies: Ct Chest Wo Contrast  Result Date: 12/14/2018 CLINICAL DATA:  Chronic dyspnea.  Negative for nondiagnostic x-ray EXAM: CT CHEST WITHOUT CONTRAST TECHNIQUE: Multidetector CT imaging of the chest was performed following the standard protocol without IV contrast. COMPARISON:  Chest x-ray from yesterday FINDINGS: Cardiovascular: No cardiomegaly or pericardial effusion. Aortic and coronary atherosclerotic calcification. Mediastinum/Nodes: Negative for adenopathy or mass. Lungs/Pleura: Bullous emphysema, panlobular. Occasional mucoid impaction in the lower lobe airways. There is no edema, consolidation, effusion, or pneumothorax. Somewhat flat irregular density at the right apex touching the pleura, scarring given shape and appearance on a 2016 radiograph. Upper Abdomen: Negative Musculoskeletal: No acute finding. Calcified disc extrusion at T7-8, likely contacting the cord. C7-T1 degenerative anterolisthesis. IMPRESSION: 1. Advanced emphysema. 2. Patchy mucoid impaction of bronchi. 3. Atherosclerosis Electronically Signed   By: Monte Fantasia M.D.   On: 12/14/2018 09:41   Dg Chest Port 1 View  Result Date: 12/27/2018 CLINICAL DATA:  Respiratory distress. COPD. EXAM: PORTABLE CHEST 1 VIEW COMPARISON:  12/13/2018 chest radiograph. FINDINGS: Stable cardiomediastinal silhouette with normal heart size. No pneumothorax. No pleural effusion. Hyperinflated lungs and advanced emphysema. No acute consolidative airspace disease. No pulmonary  edema. IMPRESSION: 1. No acute cardiopulmonary disease. 2. Hyperinflated lungs and advanced emphysema, compatible with the provided history of COPD. Electronically Signed   By: Ilona Sorrel M.D.   On: 12/27/2018 01:17   Dg Chest Port 1 View  Result Date: 12/13/2018 CLINICAL DATA:  Shortness of breath. Ex-smoker. Cough. EXAM: PORTABLE CHEST 1 VIEW COMPARISON:  12/12/2018. FINDINGS: Normal sized heart. Tortuous aorta. Stable hyperexpansion of the lungs with extensive bullous changes. Surgical staple lines are again demonstrated on the right. No acute bony abnormality. IMPRESSION: No acute abnormality. Stable  marked changes of COPD. Electronically Signed   By: Claudie Revering M.D.   On: 12/13/2018 10:55   Dg Chest Port 1 View  Result Date: 12/12/2018 CLINICAL DATA:  Shortness of breath. History of lung cancer and COPD. EXAM: PORTABLE CHEST 1 VIEW COMPARISON:  09/28/2018 FINDINGS: Cardiomediastinal silhouette is normal. Mediastinal contours appear intact. Tortuosity of the aorta. There is no evidence of focal airspace consolidation, pleural effusion or pneumothorax. Postsurgical changes in the right mid thorax. Severe upper lobe predominant emphysema. Osseous structures are without acute abnormality. Soft tissues are grossly normal. IMPRESSION: Severe upper lobe predominant emphysema. No evidence of acute lobar consolidation. Stable postsurgical changes in the right hemithorax. Electronically Signed   By: Fidela Salisbury M.D.   On: 12/12/2018 19:32     Subjective: Seen and examined at bedside and his breathing is back to baseline.  Denies any chest pain, lightheadedness or dizziness.  PT recommended no follow-up.  Ready to go home and will follow up with PCP and pulmonology within 1 week   Discharge Exam: Vitals:   12/30/18 0612 12/30/18 0748  BP: (!) 122/54   Pulse: 60   Resp: 18   Temp: 97.9 F (36.6 C)   SpO2: 98% 98%   Vitals:   12/29/18 2105 12/29/18 2114 12/30/18 0612 12/30/18 0748  BP:   140/76 (!) 122/54   Pulse:  83 60   Resp:  18 18   Temp:  98.1 F (36.7 C) 97.9 F (36.6 C)   TempSrc:  Oral Oral   SpO2: 96% 97% 98% 98%  Weight:      Height:       General: Pt is alert, awake, not in acute distress Cardiovascular: Irregularly irregular, S1/S2 +, no rubs, no gallops Respiratory: Diminished bilaterally with slight wheezing, no rhonchi; wearing home O2 requirements Abdominal: Soft, NT, ND, bowel sounds + Extremities: no edema, no cyanosis  The results of significant diagnostics from this hospitalization (including imaging, microbiology, ancillary and laboratory) are listed below for reference.    Microbiology: Recent Results (from the past 240 hour(s))  Respiratory Panel by PCR     Status: None   Collection Time: 12/27/18 11:39 AM  Result Value Ref Range Status   Adenovirus NOT DETECTED NOT DETECTED Final   Coronavirus 229E NOT DETECTED NOT DETECTED Final    Comment: (NOTE) The Coronavirus on the Respiratory Panel, DOES NOT test for the novel  Coronavirus (2019 nCoV)    Coronavirus HKU1 NOT DETECTED NOT DETECTED Final   Coronavirus NL63 NOT DETECTED NOT DETECTED Final   Coronavirus OC43 NOT DETECTED NOT DETECTED Final   Metapneumovirus NOT DETECTED NOT DETECTED Final   Rhinovirus / Enterovirus NOT DETECTED NOT DETECTED Final   Influenza A NOT DETECTED NOT DETECTED Final   Influenza B NOT DETECTED NOT DETECTED Final   Parainfluenza Virus 1 NOT DETECTED NOT DETECTED Final   Parainfluenza Virus 2 NOT DETECTED NOT DETECTED Final   Parainfluenza Virus 3 NOT DETECTED NOT DETECTED Final   Parainfluenza Virus 4 NOT DETECTED NOT DETECTED Final   Respiratory Syncytial Virus NOT DETECTED NOT DETECTED Final   Bordetella pertussis NOT DETECTED NOT DETECTED Final   Chlamydophila pneumoniae NOT DETECTED NOT DETECTED Final   Mycoplasma pneumoniae NOT DETECTED NOT DETECTED Final    Comment: Performed at [facility], [address], [place], [place] [address]     Labs: BNP (last 3 results) Recent Labs    12/27/18 0104  BNP 65.0   Basic Metabolic Panel: Recent  Labs  Lab 12/27/18 0104 12/27/18 0119 12/28/18 0720 12/30/18 0902  NA 141 139 139 138  K 4.8 4.8 4.3 3.7  CL 105  --  100 97*  CO2 25  --  27 30  GLUCOSE 117*  --  135* 168*  BUN 24*  --  24* 27*  CREATININE 0.88  --  1.08 1.08  CALCIUM 9.0  --  8.8* 8.8*  MG  --   --   --  2.2  PHOS  --   --   --  2.5   Liver Function Tests: Recent Labs  Lab 12/27/18 0104 12/30/18 0902  AST 22 17  ALT 42 33  ALKPHOS 57 45  BILITOT 0.7 0.7  PROT 6.0* 5.3*  ALBUMIN 3.6 3.0*   No results for input(s): LIPASE, AMYLASE in the last 168 hours. No results for input(s): AMMONIA in the last 168 hours. CBC: Recent Labs  Lab 12/27/18 0104 12/27/18 0119 12/28/18 0720 12/30/18 0902  WBC 21.3*  --  11.6* 8.4  NEUTROABS 20.5*  --   --  7.3  HGB 14.4 14.3 13.4 14.4  HCT 44.5 42.0 40.8 43.0  MCV 101.4*  --  99.8 99.5  PLT 214  --  173 151   Cardiac Enzymes: Recent Labs  Lab 12/27/18 0104  TROPONINI <0.03   BNP: Invalid input(s): POCBNP CBG: No results for input(s): GLUCAP in the last 168 hours. D-Dimer No results for input(s): DDIMER in the last 72 hours. Hgb A1c No results for input(s): HGBA1C in the last 72 hours. Lipid Profile No results for input(s): CHOL, HDL, LDLCALC, TRIG, CHOLHDL, LDLDIRECT in the last 72 hours. Thyroid function studies No results for input(s): TSH, T4TOTAL, T3FREE, THYROIDAB in the last 72 hours.  Invalid input(s): FREET3 Anemia work up No results for input(s): VITAMINB12, FOLATE, FERRITIN, TIBC, IRON, RETICCTPCT in the last 72 hours. Urinalysis    Component Value Date/Time   COLORURINE STRAW (A) 04/26/2018 0330   APPEARANCEUR CLEAR 04/26/2018 0330   LABSPEC 1.008 04/26/2018 0330   PHURINE 6.0 04/26/2018 0330   GLUCOSEU NEGATIVE 04/26/2018 0330   HGBUR NEGATIVE 04/26/2018 0330   BILIRUBINUR NEGATIVE 04/26/2018 0330   KETONESUR 5 (A)  04/26/2018 0330   PROTEINUR NEGATIVE 04/26/2018 0330   NITRITE NEGATIVE 04/26/2018 0330   LEUKOCYTESUR NEGATIVE 04/26/2018 0330   Sepsis Labs Invalid input(s): PROCALCITONIN,  WBC,  LACTICIDVEN Microbiology Recent Results (from the past 240 hour(s))  Respiratory Panel by PCR     Status: None   Collection Time: 12/27/18 11:39 AM  Result Value Ref Range Status   Adenovirus NOT DETECTED NOT DETECTED Final   Coronavirus 229E NOT DETECTED NOT DETECTED Final    Comment: (NOTE) The Coronavirus on the Respiratory Panel, DOES NOT test for the novel  Coronavirus (2019 nCoV)    Coronavirus HKU1 NOT DETECTED NOT DETECTED Final   Coronavirus NL63 NOT DETECTED NOT DETECTED Final   Coronavirus OC43 NOT DETECTED NOT DETECTED Final   Metapneumovirus NOT DETECTED NOT DETECTED Final   Rhinovirus / Enterovirus NOT DETECTED NOT DETECTED Final   Influenza A NOT DETECTED NOT DETECTED Final   Influenza B NOT DETECTED NOT DETECTED Final   Parainfluenza Virus 1 NOT DETECTED NOT DETECTED Final   Parainfluenza Virus 2 NOT DETECTED NOT DETECTED Final   Parainfluenza Virus 3 NOT DETECTED NOT DETECTED Final   Parainfluenza Virus 4 NOT DETECTED NOT DETECTED Final   Respiratory Syncytial Virus NOT DETECTED NOT DETECTED Final   Bordetella pertussis NOT DETECTED NOT  DETECTED Final   Chlamydophila pneumoniae NOT DETECTED NOT DETECTED Final   Mycoplasma pneumoniae NOT DETECTED NOT DETECTED Final    Comment: Performed at [facility], [place] [address] [place], [place] [address]   Time coordinating discharge: 35 minutes  SIGNED:  Kerney Elbe, DO [facility] 12/30/2018, 6:54 PM Pager is on [place]  If 7PM-7AM, please contact night-coverage [email or web address] Password [email or web address]

## 2018-12-30 NOTE — Plan of Care (Signed)
  Problem: Education: Goal: Knowledge of disease or condition will improve 12/30/2018 0500 by Forestine Chute, RN Outcome: Progressing   Problem: Education: Goal: Knowledge of the prescribed therapeutic regimen will improve 12/30/2018 0500 by Forestine Chute, RN Outcome: Progressing   Problem: Activity: Goal: Ability to tolerate increased activity will improve 12/30/2018 0500 by Forestine Chute, RN Outcome: Progressing   Problem: Activity: Goal: Will verbalize the importance of balancing activity with adequate rest periods 12/30/2018 0500 by Forestine Chute, RN Outcome: Progressing   Problem: Respiratory: Goal: Ability to maintain a clear airway will improve 12/30/2018 0500 by Forestine Chute, RN Outcome: Progressing   Problem: Respiratory: Goal: Levels of oxygenation will improve 12/30/2018 0500 by Forestine Chute, RN Outcome: Progressing   Problem: Respiratory: Goal: Ability to maintain adequate ventilation will improve 12/30/2018 0500 by Forestine Chute, RN Outcome: Progressing   Problem: Spiritual Needs Goal: Ability to function at adequate level 12/30/2018 0500 by Forestine Chute, RN Outcome: Progressing   Problem: Education: Goal: Knowledge of General Education information will improve Description Including pain rating scale, medication(s)/side effects and non-pharmacologic comfort measures Outcome: Progressing   Problem: Health Behavior/Discharge Planning: Goal: Ability to manage health-related needs will improve Outcome: Progressing   Problem: Clinical Measurements: Goal: Will remain free from infection Outcome: Progressing   Problem: Activity: Goal: Risk for activity intolerance will decrease Outcome: Progressing   Problem: Nutrition: Goal: Adequate nutrition will be maintained Outcome: Progressing   Problem: Coping: Goal: Level of anxiety will decrease Outcome: Progressing   Problem: Pain Managment: Goal:  General experience of comfort will improve Outcome: Progressing   Problem: Safety: Goal: Ability to remain free from injury will improve Outcome: Progressing   Problem: Skin Integrity: Goal: Risk for impaired skin integrity will decrease Outcome: Progressing

## 2018-12-30 NOTE — Progress Notes (Signed)
Physical Therapy Treatment Patient Details Name: Andrew [name] MRN: [id number] DOB: December 16, 1940 Today's Date: 12/30/2018    History of Present Illness Pt is a 78 y.o. 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  He was admitted for COPD exacerbation.     PT Comments    Pt is eager to d/c home this afternoon, but agrees to a short walk before lunch. Pt is at his baseline level of function and is mod I for all mobility. Pt acknowledges energy conservation techniques to improve ability to move. Session abbreviated as lunch arrived.   Follow Up Recommendations  No PT follow up     Equipment Recommendations  None recommended by PT    Recommendations for Other Services       Precautions / Restrictions Precautions Precautions: Fall Precaution Comments: on home O2, 2 liters during the day and 3 L at night Restrictions Weight Bearing Restrictions: No    Mobility  Bed Mobility Overal bed mobility: Independent                Transfers Overall transfer level: Modified independent                  Ambulation/Gait Ambulation/Gait assistance: Modified independent (Device/Increase time) Gait Distance (Feet): 175 Feet Assistive device: None Gait Pattern/deviations: WFL(Within Functional Limits) Gait velocity: decreased Gait velocity interpretation: 1.31 - 2.62 ft/sec, indicative of limited community ambulator General Gait Details: pt request to only ambulate 150 feet to conserve energy for d/c home this afternoon       Balance Overall balance assessment: No apparent balance deficits (not formally assessed)                                          Cognition Arousal/Alertness: Awake/alert Behavior During Therapy: WFL for tasks assessed/performed Overall Cognitive Status: Within Functional Limits for tasks assessed                                            General Comments General comments (skin integrity, edema, etc.): Pt on 2L O2 via [place] with SaO2 >90%O2 throughout session      Pertinent Vitals/Pain Pain Assessment: No/denies pain           PT Goals (current goals can now be found in the care plan section) Acute Rehab PT Goals Patient Stated Goal: home PT Goal Formulation: With patient Time For Goal Achievement: 01/10/19 Potential to Achieve Goals: Good Progress towards PT goals: Progressing toward goals    Frequency    Min 3X/week      PT Plan Current plan remains appropriate       AM-PAC PT "6 Clicks" Mobility   Outcome Measure  Help needed turning from your back to your side while in a flat bed without using bedrails?: None Help needed moving from lying on your back to sitting on the side of a flat bed without using bedrails?: None Help needed moving to and from a bed to a chair (including a wheelchair)?: A Little Help needed standing up from a chair using your arms (e.g., wheelchair or bedside chair)?: None Help needed to walk in hospital room?: A Little Help needed climbing 3-5 steps  with a railing? : A Little 6 Click Score: 21    End of Session Equipment Utilized During Treatment: Oxygen Activity Tolerance: Treatment limited secondary to medical complications (Comment)(DOE) Patient left: in bed;with call bell/phone within reach Nurse Communication: Mobility status PT Visit Diagnosis: Difficulty in walking, not elsewhere classified (R26.2)     Time: 4514-6047 PT Time Calculation (min) (ACUTE ONLY): 13 min  Charges:  $Gait Training: 8-22 mins                     Hollace Michelli B. Migdalia Dk PT, DPT Acute Rehabilitation Services Pager [phone] Office [phone]    [place] 12/30/2018, 12:32 PM

## 2018-12-31 DIAGNOSIS — J9611 Chronic respiratory failure with hypoxia: Secondary | ICD-10-CM | POA: Diagnosis not present

## 2018-12-31 DIAGNOSIS — Z7952 Long term (current) use of systemic steroids: Secondary | ICD-10-CM | POA: Diagnosis not present

## 2018-12-31 DIAGNOSIS — J439 Emphysema, unspecified: Secondary | ICD-10-CM | POA: Diagnosis not present

## 2018-12-31 DIAGNOSIS — I272 Pulmonary hypertension, unspecified: Secondary | ICD-10-CM | POA: Diagnosis not present

## 2018-12-31 DIAGNOSIS — G4733 Obstructive sleep apnea (adult) (pediatric): Secondary | ICD-10-CM | POA: Diagnosis not present

## 2018-12-31 DIAGNOSIS — Z9981 Dependence on supplemental oxygen: Secondary | ICD-10-CM | POA: Diagnosis not present

## 2018-12-31 DIAGNOSIS — I480 Paroxysmal atrial fibrillation: Secondary | ICD-10-CM | POA: Diagnosis not present

## 2018-12-31 DIAGNOSIS — Z7901 Long term (current) use of anticoagulants: Secondary | ICD-10-CM | POA: Diagnosis not present

## 2018-12-31 DIAGNOSIS — I10 Essential (primary) hypertension: Secondary | ICD-10-CM | POA: Diagnosis not present

## 2018-12-31 DIAGNOSIS — Z87891 Personal history of nicotine dependence: Secondary | ICD-10-CM | POA: Diagnosis not present

## 2018-12-31 DIAGNOSIS — Z902 Acquired absence of lung [part of]: Secondary | ICD-10-CM | POA: Diagnosis not present

## 2018-12-31 DIAGNOSIS — Z7951 Long term (current) use of inhaled steroids: Secondary | ICD-10-CM | POA: Diagnosis not present

## 2019-01-04 DIAGNOSIS — I10 Essential (primary) hypertension: Secondary | ICD-10-CM | POA: Diagnosis not present

## 2019-01-04 DIAGNOSIS — I272 Pulmonary hypertension, unspecified: Secondary | ICD-10-CM | POA: Diagnosis not present

## 2019-01-04 DIAGNOSIS — J9611 Chronic respiratory failure with hypoxia: Secondary | ICD-10-CM | POA: Diagnosis not present

## 2019-01-04 DIAGNOSIS — J439 Emphysema, unspecified: Secondary | ICD-10-CM | POA: Diagnosis not present

## 2019-01-04 DIAGNOSIS — I480 Paroxysmal atrial fibrillation: Secondary | ICD-10-CM | POA: Diagnosis not present

## 2019-01-04 DIAGNOSIS — G4733 Obstructive sleep apnea (adult) (pediatric): Secondary | ICD-10-CM | POA: Diagnosis not present

## 2019-01-05 DIAGNOSIS — I480 Paroxysmal atrial fibrillation: Secondary | ICD-10-CM | POA: Diagnosis not present

## 2019-01-05 DIAGNOSIS — G4733 Obstructive sleep apnea (adult) (pediatric): Secondary | ICD-10-CM | POA: Diagnosis not present

## 2019-01-05 DIAGNOSIS — J439 Emphysema, unspecified: Secondary | ICD-10-CM | POA: Diagnosis not present

## 2019-01-05 DIAGNOSIS — I10 Essential (primary) hypertension: Secondary | ICD-10-CM | POA: Diagnosis not present

## 2019-01-05 DIAGNOSIS — J9611 Chronic respiratory failure with hypoxia: Secondary | ICD-10-CM | POA: Diagnosis not present

## 2019-01-05 DIAGNOSIS — I272 Pulmonary hypertension, unspecified: Secondary | ICD-10-CM | POA: Diagnosis not present

## 2019-01-06 DIAGNOSIS — I10 Essential (primary) hypertension: Secondary | ICD-10-CM | POA: Diagnosis not present

## 2019-01-06 DIAGNOSIS — J439 Emphysema, unspecified: Secondary | ICD-10-CM | POA: Diagnosis not present

## 2019-01-06 DIAGNOSIS — K219 Gastro-esophageal reflux disease without esophagitis: Secondary | ICD-10-CM | POA: Diagnosis not present

## 2019-01-06 DIAGNOSIS — J9611 Chronic respiratory failure with hypoxia: Secondary | ICD-10-CM | POA: Diagnosis not present

## 2019-01-07 ENCOUNTER — Ambulatory Visit (INDEPENDENT_AMBULATORY_CARE_PROVIDER_SITE_OTHER): Payer: Medicare Other | Admitting: Adult Health

## 2019-01-07 ENCOUNTER — Ambulatory Visit (INDEPENDENT_AMBULATORY_CARE_PROVIDER_SITE_OTHER): Payer: Medicare Other | Admitting: Pulmonary Disease

## 2019-01-07 ENCOUNTER — Encounter: Payer: Self-pay | Admitting: Adult Health

## 2019-01-07 ENCOUNTER — Ambulatory Visit: Payer: Medicare Other | Admitting: Pulmonary Disease

## 2019-01-07 DIAGNOSIS — E43 Unspecified severe protein-calorie malnutrition: Secondary | ICD-10-CM | POA: Diagnosis not present

## 2019-01-07 DIAGNOSIS — J449 Chronic obstructive pulmonary disease, unspecified: Secondary | ICD-10-CM

## 2019-01-07 DIAGNOSIS — J9611 Chronic respiratory failure with hypoxia: Secondary | ICD-10-CM

## 2019-01-07 DIAGNOSIS — Z8679 Personal history of other diseases of the circulatory system: Secondary | ICD-10-CM

## 2019-01-07 DIAGNOSIS — R0602 Shortness of breath: Secondary | ICD-10-CM | POA: Diagnosis not present

## 2019-01-07 LAB — PULMONARY FUNCTION TEST
DL/VA % pred: 44 %
DL/VA: 1.78 ml/min/mmHg/L
DLCO COR % PRED: 24 %
DLCO cor: 5.52 ml/min/mmHg
DLCO unc % pred: 24 %
DLCO unc: 5.49 ml/min/mmHg
FEF 25-75 Post: 0.32 L/sec
FEF 25-75 Pre: 0.33 L/sec
FEF2575-%Change-Post: -2 %
FEF2575-%Pred-Post: 18 %
FEF2575-%Pred-Pre: 18 %
FEV1-%Change-Post: -7 %
FEV1-%Pred-Post: 31 %
FEV1-%Pred-Pre: 34 %
FEV1-POST: 0.8 L
FEV1-Pre: 0.87 L
FEV1FVC-%Change-Post: -4 %
FEV1FVC-%PRED-PRE: 56 %
FEV6-%Change-Post: -2 %
FEV6-%Pred-Post: 58 %
FEV6-%Pred-Pre: 60 %
FEV6-Post: 1.96 L
FEV6-Pre: 2.01 L
FEV6FVC-%Change-Post: 0 %
FEV6FVC-%Pred-Post: 101 %
FEV6FVC-%Pred-Pre: 100 %
FVC-%Change-Post: -2 %
FVC-%Pred-Post: 58 %
FVC-%Pred-Pre: 59 %
FVC-PRE: 2.14 L
FVC-Post: 2.09 L
Post FEV1/FVC ratio: 38 %
Post FEV6/FVC ratio: 94 %
Pre FEV1/FVC ratio: 40 %
Pre FEV6/FVC Ratio: 93 %
RV % pred: 223 %
RV: 5.53 L
TLC % PRED: 124 %
TLC: 8.03 L

## 2019-01-07 MED ORDER — SPIRIVA HANDIHALER 18 MCG IN CAPS: ORAL_CAPSULE | RESPIRATORY_TRACT | 1 refills | Status: DC

## 2019-01-07 MED ORDER — ARFORMOTEROL TARTRATE 15 MCG/2ML IN NEBU: 15.0000 ug | INHALATION_SOLUTION | Freq: Two times a day (BID) | RESPIRATORY_TRACT | 6 refills | Status: DC

## 2019-01-07 MED ORDER — BUDESONIDE 0.5 MG/2ML IN SUSP: 0.5000 mg | Freq: Two times a day (BID) | RESPIRATORY_TRACT | 5 refills | Status: DC

## 2019-01-07 NOTE — Patient Instructions (Addendum)
Stop Symbicort .  Continue on Budesonide Neb Twice daily  . We will send this prescription to [facility] .  Begin Brovana Neb Twice daily   (Budesonide and Brovana = Symbicort )  Change Spiriva Respimat back to Spiriva Handihaler 1 puff daily . (To help with cost) .  Mucinex DM Twice daily   Add Flutter valve Three times a day  .  Hold on prednisone 10mg  daily .  Continue on Duoneb Three -Four times daily .   Use CPAP At bedtime  And with naps with oxygen 3l/m .  Can also use CPAP if you start having increased breathing issues with shortness of breath /wheezing.  Continue on Oxygen 2l/m  Follow up with Dr. Valeta Harms or [place] NP in 2-3 weeks and As needed  .  Please contact office for sooner follow up if symptoms do not improve or worsen or seek emergency care

## 2019-01-07 NOTE — Progress Notes (Signed)
@Patient  ID: Andrew [name], male    DOB: November 08, 1941, 78 y.o.   MRN: [id number]  Chief Complaint  Patient presents with  . 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  Patient is been admitted twice in the last 1 month .  Patient has a known history of severe COPD that is oxygen dependent he is on nocturnal CPAP.  He has a complicated medical history including cor pulmonale and pulmonary hypertension. Patient was previously followed by Dr. Lenna Gilford.  He has been steroid-dependent on low-dose prednisone for the last 2 years.  He has been on Advair and Spiriva.  Is prone to recurrent exacerbations. Patient was seen for initial visit with Dr. Valeta Harms on December 03, 2018 ,  patient was changed from Advair to Symbicort, Spiriva HandiHaler was changed to Spiriva Respimat.  Prednisone was started on a very slow taper.  Budesonide nebulizer was added to his regimen. Patient says that shortly after going down the prednisone he started to have significant trouble with breathing and this worsened leading to hospitalization initially on January 25.  He was treated with IV antibiotics nebulized bronchodilators and steroids.  Chest x-ray showed no acute infiltrates.  Respiratory viral panel was negative.  He was readmitted on February 9 for a COPD exacerbation He  was treated with antibiotics and steroids.  Patient says since discharge he is feeling better.  Cough and congestion have decreased.  Patient has tapered prednisone to 10 mg daily. He says that his Spiriva is now very expensive since changing from the HandiHaler to Respimat. He is very anxious about decreasing the prednisone down to a lower dose due to his recent 2 hospitalizations. Pulmonary function testing today showed severe COPD with FEV1 at 34%, ratio 40, FVC 59%, DLCO 24%, no significant bronchodilator response.  This is similar to his spirometry and 2016 with an FEV1 at 37%.  Patient does have pulmonary hypertension.  He remains on revatio  twice daily. Patient denies any chest pain orthopnea PND or increased leg swelling   No Known Allergies  Immunization History  Administered Date(s) Administered  . Influenza, High Dose Seasonal PF 07/22/2016, 08/18/2017, 08/17/2018  . Influenza-Unspecified 09/19/2015  . PPD Test 02/15/2016  . Pneumococcal Conjugate-13 09/19/2015  . Pneumococcal Polysaccharide-23 12/20/2016    Past Medical History:  Diagnosis Date  . Anemia   . BiPAP (biphasic positive airway pressure) dependence    Pt denies history of OSA  . BPH (benign prostatic hyperplasia)   . Cancer ([place])    skin  . COPD (chronic obstructive pulmonary disease) ([place])   . Dysphagia   . Emphysema lung ([place])   . GERD (gastroesophageal reflux disease)    " Silent reflux"  . Hearing loss    right ear  . History of hiatal hernia   . HLD (hyperlipidemia)   . Hypertension   . Oxygen dependent    2-3 liters  .  Oxygen dependent   . Pneumonia   . Pulmonary hypertension ([place])   . Sleep apnea    wears cpap    Tobacco History: Social History   Tobacco Use  Smoking Status Former Smoker  . Packs/day: 0.00  . Years: 0.00  . Pack years: 0.00  . Types: Cigarettes  . Last attempt to quit: 10/01/1984  . Years since quitting: 34.2  Smokeless Tobacco Never Used   Counseling given:  Not Answered   Outpatient Medications Prior to Visit  Medication Sig Dispense Refill  . albuterol (PROVENTIL HFA;VENTOLIN HFA) 108 (90 Base) MCG/ACT inhaler Inhale 2 puffs into the lungs every 6 (six) hours as needed for wheezing or shortness of breath. 1 Inhaler 11  . apixaban (ELIQUIS) 5 MG TABS tablet Take 1 tablet (5 mg total) by mouth 2 (two) times daily. 180 tablet 1  . diltiazem (CARDIZEM CD) 180 MG 24 hr capsule TAKE 1 CAPSULE EVERY DAY (Patient taking differently: Take 180 mg by mouth daily. ) 90 capsule 2  . feeding supplement, ENSURE ENLIVE, (ENSURE ENLIVE) LIQD Take 237 mLs by mouth 2 (two) times daily between meals. 237 mL 12  . finasteride (PROSCAR) 5 MG tablet Take 5 mg by mouth at bedtime.     . fluticasone (FLONASE) 50 MCG/ACT nasal spray Place 1 spray into both nostrils at bedtime.     Marland Kitchen guaiFENesin (MUCINEX) 600 MG 12 hr tablet Take 1 tablet (600 mg total) by mouth 2 (two) times daily. 30 tablet 0  . hydrocortisone cream 1 % Apply 1 application topically daily as needed (Eczema on face and head).    Marland Kitchen ipratropium-albuterol (DUONEB) 0.5-2.5 (3) MG/3ML SOLN Take 3 mLs by nebulization 3 (three) times daily. 360 mL 0  . Miconazole Nitrate (CRITIC-AID CLEAR AF) 2 % OINT Apply 1 application topically at bedtime.    . Multiple Vitamins-Minerals (MULTIVITAMIN WITH MINERALS) tablet Take 1 tablet by mouth daily.    Marland Kitchen nystatin (MYCOSTATIN) 100000 UNIT/ML suspension Take 6 mLs by mouth 4 (four) times daily.    Marland Kitchen omeprazole (PRILOSEC) 40 MG capsule Take 40 mg by mouth 2 (two) times daily.     . OXYGEN Inhale 2-3 L into the lungs See admin instructions. 2L daytime, 3L at night     . polyethylene glycol (MIRALAX / GLYCOLAX) packet Take 17 g by mouth daily. (Patient taking differently: Take 17 g by mouth at bedtime. Mix in 8 oz liquid and drink) 14 each 1  . rosuvastatin (CRESTOR) 10 MG tablet Take 10 mg by mouth daily.    . sildenafil (REVATIO) 20 MG tablet Take 1 tablet (20 mg total) by  mouth 2 (two) times daily. 180 tablet 3  . Spacer/Aero-Holding Chambers (AEROCHAMBER MV) inhaler Use as instructed 1 each 0  . tamsulosin (FLOMAX) 0.4 MG CAPS capsule Take 1 capsule (0.4 mg total) by mouth daily after breakfast. 30 capsule 0  . temazepam (RESTORIL) 30 MG capsule Take 1 capsule (30 mg total) by mouth at bedtime. 30 capsule 5  . vitamin C (ASCORBIC ACID) 500 MG tablet Take 500 mg by mouth 2 (two) times daily.     Marland Kitchen VITAMIN D PO Take 1 tablet by mouth daily.    . budesonide (PULMICORT) 0.5 MG/2ML nebulizer solution Take 2 mLs (0.5 mg total) by nebulization 2 (two) times daily. 120 mL 0  . budesonide-formoterol (SYMBICORT) 160-4.5 MCG/ACT inhaler Inhale 2 puffs into the lungs every 12 (twelve) hours. 1 Inhaler 0  . SPIRIVA HANDIHALER 18 MCG inhalation  capsule INHALE THE CONTENTS OF 1 CAPSULE EVERY DAY (Patient taking differently: Place 18 mcg into inhaler and inhale daily. ) 90 capsule 3  . IRON PO Take 1 tablet by mouth 3 (three) times a week.    . liver oil-zinc oxide (DESITIN) 40 % ointment Apply 1 application topically daily.    . predniSONE (STERAPRED UNI-PAK 21 TAB) 10 MG (21) TBPK tablet Take 6 Tablets Day 1, 5 Tablets Day 2, 4 Tablets Day 3, 3 Tablets Day 4, 2 Tablets Day 5, 1 Tablet Day 6, and Stop Day 7 (Patient not taking: Reported on 01/07/2019) 21 tablet 0   No facility-administered medications prior to visit.      Review of Systems:   Constitutional:   No  weight loss, night sweats,  Fevers, chills,  +fatigue, or  lassitude.  HEENT:   No headaches,  Difficulty swallowing,  Tooth/dental problems, or  Sore throat,                No sneezing, itching, ear ache, + nasal congestion, post nasal drip,   CV:  No chest pain,  Orthopnea, PND, swelling in lower extremities, anasarca, dizziness, palpitations, syncope.   GI  No heartburn, indigestion, abdominal pain, nausea, vomiting, diarrhea, change in bowel habits, loss of appetite, bloody stools.   Resp:   No chest wall  deformity  Skin: no rash or lesions.  GU: no dysuria, change in color of urine, no urgency or frequency.  No flank pain, no hematuria   MS:  No joint pain or swelling.  No decreased range of motion.  No back pain.    Physical Exam  BP (!) 100/58 (BP Location: Left Arm, Cuff Size: Normal)   Pulse 92   Ht 5\' 7"  (1.702 m)   Wt 129 lb (58.5 kg)   SpO2 95%   BMI 20.20 kg/m   GEN: A/Ox3; pleasant , NAD, frail elderly man on oxygen   HEENT:  [place]/AT,  EACs-clear, TMs-wnl, NOSE-clear, THROAT-clear, no lesions, no postnasal drip or exudate noted.   NECK:  Supple w/ fair ROM; no JVD; normal carotid impulses w/o bruits; no thyromegaly or nodules palpated; no lymphadenopathy.    RESP decreased breath sounds in the bases  no accessory muscle use, no dullness to percussion  CARD:  RRR, no m/r/g, no peripheral edema, pulses intact, no cyanosis or clubbing.  GI:   Soft & nt; nml bowel sounds; no organomegaly or masses detected.   Musco: Warm bil, no deformities or joint swelling noted.   Neuro: alert, no focal deficits noted.    Skin: Warm, no lesions or rashes    Lab Results:  CBC    Component Value Date/Time   WBC 8.4 12/30/2018 0902   RBC 4.32 12/30/2018 0902   HGB 14.4 12/30/2018 0902   HCT 43.0 12/30/2018 0902   PLT 151 12/30/2018 0902   MCV 99.5 12/30/2018 0902   MCH 33.3 12/30/2018 0902   MCHC 33.5 12/30/2018 0902   RDW 12.5 12/30/2018 0902   LYMPHSABS 0.7 12/30/2018 0902   MONOABS 0.4 12/30/2018 0902   EOSABS 0.0 12/30/2018 0902   BASOSABS 0.0 12/30/2018 0902    BMET    Component Value Date/Time   NA 138 12/30/2018 0902   K 3.7 12/30/2018 0902   CL 97 (L) 12/30/2018 0902   CO2 30 12/30/2018 0902   GLUCOSE 168 (H) 12/30/2018 0902   BUN 27 (H) 12/30/2018 0902   CREATININE 1.08 12/30/2018 0902   CALCIUM 8.8 (L) 12/30/2018  0902   GFRNONAA >60 12/30/2018 0902   GFRAA >60 12/30/2018 0902    BNP    Component Value Date/Time   BNP 51.4 12/27/2018 0104     ProBNP    Component Value Date/Time   PROBNP 23.0 09/28/2018 1242    Imaging: Ct Chest Wo Contrast  Result Date: 12/14/2018 CLINICAL DATA:  Chronic dyspnea.  Negative for nondiagnostic x-ray EXAM: CT CHEST WITHOUT CONTRAST TECHNIQUE: Multidetector CT imaging of the chest was performed following the standard protocol without IV contrast. COMPARISON:  Chest x-ray from yesterday FINDINGS: Cardiovascular: No cardiomegaly or pericardial effusion. Aortic and coronary atherosclerotic calcification. Mediastinum/Nodes: Negative for adenopathy or mass. Lungs/Pleura: Bullous emphysema, panlobular. Occasional mucoid impaction in the lower lobe airways. There is no edema, consolidation, effusion, or pneumothorax. Somewhat flat irregular density at the right apex touching the pleura, scarring given shape and appearance on a 2016 radiograph. Upper Abdomen: Negative Musculoskeletal: No acute finding. Calcified disc extrusion at T7-8, likely contacting the cord. C7-T1 degenerative anterolisthesis. IMPRESSION: 1. Advanced emphysema. 2. Patchy mucoid impaction of bronchi. 3. Atherosclerosis Electronically Signed   By: Monte Fantasia M.D.   On: 12/14/2018 09:41   Dg Chest Port 1 View  Result Date: 12/27/2018 CLINICAL DATA:  Respiratory distress. COPD. EXAM: PORTABLE CHEST 1 VIEW COMPARISON:  12/13/2018 chest radiograph. FINDINGS: Stable cardiomediastinal silhouette with normal heart size. No pneumothorax. No pleural effusion. Hyperinflated lungs and advanced emphysema. No acute consolidative airspace disease. No pulmonary edema. IMPRESSION: 1. No acute cardiopulmonary disease. 2. Hyperinflated lungs and advanced emphysema, compatible with the provided history of COPD. Electronically Signed   By: Ilona Sorrel M.D.   On: 12/27/2018 01:17   Dg Chest Port 1 View  Result Date: 12/13/2018 CLINICAL DATA:  Shortness of breath. Ex-smoker. Cough. EXAM: PORTABLE CHEST 1 VIEW COMPARISON:  12/12/2018. FINDINGS: Normal sized  heart. Tortuous aorta. Stable hyperexpansion of the lungs with extensive bullous changes. Surgical staple lines are again demonstrated on the right. No acute bony abnormality. IMPRESSION: No acute abnormality. Stable marked changes of COPD. Electronically Signed   By: Claudie Revering M.D.   On: 12/13/2018 10:55   Dg Chest Port 1 View  Result Date: 12/12/2018 CLINICAL DATA:  Shortness of breath. History of lung cancer and COPD. EXAM: PORTABLE CHEST 1 VIEW COMPARISON:  09/28/2018 FINDINGS: Cardiomediastinal silhouette is normal. Mediastinal contours appear intact. Tortuosity of the aorta. There is no evidence of focal airspace consolidation, pleural effusion or pneumothorax. Postsurgical changes in the right mid thorax. Severe upper lobe predominant emphysema. Osseous structures are without acute abnormality. Soft tissues are grossly normal. IMPRESSION: Severe upper lobe predominant emphysema. No evidence of acute lobar consolidation. Stable postsurgical changes in the right hemithorax. Electronically Signed   By: Fidela Salisbury M.D.   On: 12/12/2018 19:32      PFT Results Latest Ref Rng & Units 01/07/2019  FVC-Pre L 2.14  FVC-Predicted Pre % 59  FVC-Post L 2.09  FVC-Predicted Post % 58  Pre FEV1/FVC % % 40  Post FEV1/FCV % % 38  FEV1-Pre L 0.87  FEV1-Predicted Pre % 34  FEV1-Post L 0.80  DLCO UNC% % 24  DLCO COR %Predicted % 44  TLC L 8.03  TLC % Predicted % 124  RV % Predicted % 223    No results found for: NITRICOXIDE      Assessment & Plan:   Stage 3 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 (HCC) Severe COPD with bullous emphysema, oxygen dependent.  Patient is prone to recurrent exacerbations and is  on maximum therapy.  He is very deconditioned.  For now we will need to change Spiriva back to HandiHaler to help with cost factor. We will change his Symbicort to budesonide and Brovana nebulizer to see if this will also help with his cost. We will hold prednisone at 10 mg for  now. Ideally would like to change DuoNeb nebulizer to albuterol however considering patient's recurrent decompensation recently we will leave DuoNeb for now. Patient is to increase flutter valve 3 times daily. Patient is to use CPAP at bedtime and with naptime also if he has increased shortness of breath could try to use CPAP for respiratory support. Patient would benefit from pulmonary rehab, however is unable to do this at this time   Plan  Patient Instructions  Stop Symbicort .  Continue on Budesonide Neb Twice daily  . We will send this prescription to [place] .  Begin Brovana Neb Twice daily   (Budesonide and Brovana = Symbicort )  Change Spiriva Respimat back to Spiriva Handihaler 1 puff daily . (To help with cost) .  Mucinex DM Twice daily   Add Flutter valve Three times a day  .  Hold on prednisone 10mg  daily .  Continue on Duoneb Three -Four times daily .   Use CPAP At bedtime  And with naps with oxygen 3l/m .  Can also use CPAP if you start having increased breathing issues with shortness of breath /wheezing.  Continue on Oxygen 2l/m  Follow up with Dr. Valeta Harms or Castulo Scarpelli NP in 2-3 weeks and As needed  .  Please contact office for sooner follow up if symptoms do not improve or worsen or seek emergency care        Chronic hypoxemic respiratory failure (HCC) Continue on oxygen Continue on CPAP at bedtime with oxygen  History of pulmonary hypertension Cor pulmonale and mild pulmonary hypertension appears compensated without evidence of volume overload on exam.  Continue on current regimen  Protein-calorie malnutrition, severe Encouraged on a high-protein diet     Rexene Edison, NP 01/07/2019

## 2019-01-07 NOTE — Assessment & Plan Note (Signed)
Severe COPD with bullous emphysema, oxygen dependent.  Patient is prone to recurrent exacerbations and is on maximum therapy.  He is very deconditioned.  For now we will need to change Spiriva back to HandiHaler to help with cost factor. We will change his Symbicort to budesonide and Brovana nebulizer to see if this will also help with his cost. We will hold prednisone at 10 mg for now. Ideally would like to change DuoNeb nebulizer to albuterol however considering patient's recurrent decompensation recently we will leave DuoNeb for now. Patient is to increase flutter valve 3 times daily. Patient is to use CPAP at bedtime and with naptime also if he has increased shortness of breath could try to use CPAP for respiratory support. Patient would benefit from pulmonary rehab, however is unable to do this at this time   Plan  Patient Instructions  Stop Symbicort .  Continue on Budesonide Neb Twice daily  . We will send this prescription to [facility] .  Begin Brovana Neb Twice daily   (Budesonide and Brovana = Symbicort )  Change Spiriva Respimat back to Spiriva Handihaler 1 puff daily . (To help with cost) .  Mucinex DM Twice daily   Add Flutter valve Three times a day  .  Hold on prednisone 10mg  daily .  Continue on Duoneb Three -Four times daily .   Use CPAP At bedtime  And with naps with oxygen 3l/m .  Can also use CPAP if you start having increased breathing issues with shortness of breath /wheezing.  Continue on Oxygen 2l/m  Follow up with Dr. Valeta Harms or [place] NP in 2-3 weeks and As needed  .  Please contact office for sooner follow up if symptoms do not improve or worsen or seek emergency care

## 2019-01-07 NOTE — Assessment & Plan Note (Signed)
Encouraged on a high-protein diet 

## 2019-01-07 NOTE — Progress Notes (Signed)
PFT done today. 

## 2019-01-07 NOTE — Assessment & Plan Note (Signed)
Cor pulmonale and mild pulmonary hypertension appears compensated without evidence of volume overload on exam.  Continue on current regimen

## 2019-01-07 NOTE — Assessment & Plan Note (Signed)
Continue on oxygen Continue on CPAP at bedtime with oxygen

## 2019-01-08 DIAGNOSIS — J9611 Chronic respiratory failure with hypoxia: Secondary | ICD-10-CM | POA: Diagnosis not present

## 2019-01-08 DIAGNOSIS — I10 Essential (primary) hypertension: Secondary | ICD-10-CM | POA: Diagnosis not present

## 2019-01-08 DIAGNOSIS — J439 Emphysema, unspecified: Secondary | ICD-10-CM | POA: Diagnosis not present

## 2019-01-08 DIAGNOSIS — G4733 Obstructive sleep apnea (adult) (pediatric): Secondary | ICD-10-CM | POA: Diagnosis not present

## 2019-01-08 DIAGNOSIS — I480 Paroxysmal atrial fibrillation: Secondary | ICD-10-CM | POA: Diagnosis not present

## 2019-01-08 DIAGNOSIS — I272 Pulmonary hypertension, unspecified: Secondary | ICD-10-CM | POA: Diagnosis not present

## 2019-01-13 DIAGNOSIS — J439 Emphysema, unspecified: Secondary | ICD-10-CM | POA: Diagnosis not present

## 2019-01-13 DIAGNOSIS — I480 Paroxysmal atrial fibrillation: Secondary | ICD-10-CM | POA: Diagnosis not present

## 2019-01-13 DIAGNOSIS — G4733 Obstructive sleep apnea (adult) (pediatric): Secondary | ICD-10-CM | POA: Diagnosis not present

## 2019-01-13 DIAGNOSIS — I272 Pulmonary hypertension, unspecified: Secondary | ICD-10-CM | POA: Diagnosis not present

## 2019-01-13 DIAGNOSIS — J9611 Chronic respiratory failure with hypoxia: Secondary | ICD-10-CM | POA: Diagnosis not present

## 2019-01-13 DIAGNOSIS — I10 Essential (primary) hypertension: Secondary | ICD-10-CM | POA: Diagnosis not present

## 2019-01-13 NOTE — Progress Notes (Signed)
PCCM: Agree. Thanks for seeing him. Nebs are a great idea.  [facility] 01/13/2019 5:16 PM

## 2019-01-19 DIAGNOSIS — G4733 Obstructive sleep apnea (adult) (pediatric): Secondary | ICD-10-CM | POA: Diagnosis not present

## 2019-01-19 DIAGNOSIS — J439 Emphysema, unspecified: Secondary | ICD-10-CM | POA: Diagnosis not present

## 2019-01-19 DIAGNOSIS — I480 Paroxysmal atrial fibrillation: Secondary | ICD-10-CM | POA: Diagnosis not present

## 2019-01-19 DIAGNOSIS — I10 Essential (primary) hypertension: Secondary | ICD-10-CM | POA: Diagnosis not present

## 2019-01-19 DIAGNOSIS — I272 Pulmonary hypertension, unspecified: Secondary | ICD-10-CM | POA: Diagnosis not present

## 2019-01-19 DIAGNOSIS — J9611 Chronic respiratory failure with hypoxia: Secondary | ICD-10-CM | POA: Diagnosis not present

## 2019-01-21 ENCOUNTER — Encounter: Payer: Self-pay | Admitting: Pulmonary Disease

## 2019-01-21 ENCOUNTER — Ambulatory Visit (INDEPENDENT_AMBULATORY_CARE_PROVIDER_SITE_OTHER): Payer: Medicare Other | Admitting: Pulmonary Disease

## 2019-01-21 VITALS — BP 110/70 | HR 85 | Ht 67.0 in | Wt 129.6 lb

## 2019-01-21 DIAGNOSIS — D899 Disorder involving the immune mechanism, unspecified: Secondary | ICD-10-CM

## 2019-01-21 DIAGNOSIS — J9611 Chronic respiratory failure with hypoxia: Secondary | ICD-10-CM

## 2019-01-21 DIAGNOSIS — Z7952 Long term (current) use of systemic steroids: Secondary | ICD-10-CM | POA: Diagnosis not present

## 2019-01-21 DIAGNOSIS — Z5181 Encounter for therapeutic drug level monitoring: Secondary | ICD-10-CM | POA: Diagnosis not present

## 2019-01-21 DIAGNOSIS — J449 Chronic obstructive pulmonary disease, unspecified: Secondary | ICD-10-CM | POA: Diagnosis not present

## 2019-01-21 DIAGNOSIS — Z8679 Personal history of other diseases of the circulatory system: Secondary | ICD-10-CM | POA: Diagnosis not present

## 2019-01-21 DIAGNOSIS — D849 Immunodeficiency, unspecified: Secondary | ICD-10-CM

## 2019-01-21 MED ORDER — AZITHROMYCIN 250 MG PO TABS: 250.0000 mg | ORAL_TABLET | ORAL | 0 refills | Status: DC

## 2019-01-21 NOTE — Progress Notes (Signed)
Synopsis: Referred in Jan. 2020 to establish care with me, patient of Dr. Lenna Gilford.  Subjective:   PATIENT ID: Andrew [name] GENDER: male DOB: 17-Jan-1941, MRN: [id number]  Chief Complaint  Patient presents with  . Follow-up    Patient states he is back on prednisone 84m daily. Budesonide BID. Stopped symbicort and using spiriva.     PMH COPD, [facility]. Using albuterol as needed and Spiriva and Advair for >10 years. He has not been tried on anything else at the time. He has been on prednisone 138malmost every day for the past 2-3 months.  In the past several years he has been in the hospital at least 6 times he states.  He is currently oxygen dependent overall doing well.  [facility]  He states that he has a newfound reason for living and that is his new girlfriend.  They met at church.  Overall his breathing is about the same.  He feels like he is at least able to complete most of his activities of daily living.  He does have dyspnea with extensive exertion but otherwise okay.  Last FEV1 was completed on office spirometry 37% predicted 2016.  OV 01/21/2019: He was recently admitted to the hospital for an exacerbation.  During this time he was put back up on 10 mg of prednisone a day.  He was at one point able to taper himself off to 6 mg a day.  [facility] He was switched recently from inhaler regimens to nebulized medications to include Pulmicort, Brovana and scheduled duo nebs.  He is currently using the HandiHaler as well.  Overall he states that his respiratory status is at his previous baseline.  He does feel somewhat better he is a little bit concerned that coming off prednisone was a reason for his flare.  Patient denies fevers, chills night sweats weight loss.   Past Medical History:  Diagnosis Date  . Anemia   . BiPAP (biphasic positive airway pressure) dependence    Pt denies  history of OSA  . BPH (benign prostatic hyperplasia)   . Cancer ([facility]   skin  . COPD (chronic obstructive pulmonary disease) ([place]  . Dysphagia   . Emphysema lung ([facility]  . GERD (gastroesophageal reflux disease)    " Silent reflux"  . Hearing loss    right ear  . History of hiatal hernia   . HLD (hyperlipidemia)   . Hypertension   . Oxygen dependent    2-3 liters  . Oxygen dependent   . Pneumonia   . Pulmonary hypertension ([facility] [place]  . Sleep apnea    wears cpap     Family History  Problem Relation Age of Onset  . Cancer Maternal Uncle   . Pancreatitis Mother   . Bone cancer Brother   . Stroke Maternal Grandfather   . Congestive Heart Failure Maternal Grandfather      Past Surgical History:  Procedure Laterality Date  . CARDIAC CATHETERIZATION    . CATARACT EXTRACTION W/ INTRAOCULAR LENS  IMPLANT, BILATERAL    . COLONOSCOPY WITH PROPOFOL N/A 04/20/2018   Procedure: COLONOSCOPY WITH PROPOFOL;  Surgeon: ScWilford CornerMD;  [place] [facility] Service: [place];  Laterality: N/A;  . ESOPHAGOGASTRODUODENOSCOPY (EGD) WITH PROPOFOL N/A 08/23/2016   Procedure: ESOPHAGOGASTRODUODENOSCOPY (EGD) WITH PROPOFOL;  Surgeon: ViWilford CornerMD;  [place] Service:  Endoscopy;  Laterality: N/A;  . ESOPHAGOGASTRODUODENOSCOPY (EGD) WITH PROPOFOL N/A 04/20/2018   Procedure: ESOPHAGOGASTRODUODENOSCOPY (EGD) WITH PROPOFOL;  Surgeon: Wilford Corner, MD;  Location: [place];  Service: Endoscopy;  Laterality: N/A;  . HOT HEMOSTASIS N/A 04/20/2018   Procedure: HOT HEMOSTASIS (ARGON PLASMA COAGULATION/BICAP);  Surgeon: Wilford Corner, MD;  Location: [place];  Service: Endoscopy;  Laterality: N/A;  . LUNG SURGERY    . POLYPECTOMY  04/20/2018   Procedure: POLYPECTOMY;  Surgeon: Wilford Corner, MD;  [facility] ENDOSCOPY;  Service: Endoscopy;;  . TONSILLECTOMY      Social History   Socioeconomic History  . Marital status: Divorced    Spouse name: Not on file   . Number of children: Not on file  . Years of education: Not on file  . Highest education level: Not on file  Occupational History  . Not on file  Social Needs  . Financial resource strain: Not on file  . Food insecurity:    Worry: Not on file    Inability: Not on file  . Transportation needs:    Medical: Not on file    Non-medical: Not on file  Tobacco Use  . Smoking status: Former Smoker    Packs/day: 0.00    Years: 0.00    Pack years: 0.00    Types: Cigarettes    Last attempt to quit: 10/01/1984    Years since quitting: 34.3  . Smokeless tobacco: Never Used  Substance and Sexual Activity  . Alcohol use: No    Alcohol/week: 0.0 standard drinks  . Drug use: No  . Sexual activity: Not Currently  Lifestyle  . Physical activity:    Days per week: Not on file    Minutes per session: Not on file  . Stress: Not on file  Relationships  . Social connections:    Talks on phone: Not on file    Gets together: Not on file    Attends religious service: Not on file    Active member of club or organization: Not on file    Attends meetings of clubs or organizations: Not on file    Relationship status: Not on file  . Intimate partner violence:    Fear of current or ex partner: Not on file    Emotionally abused: Not on file    Physically abused: Not on file    Forced sexual activity: Not on file  Other Topics Concern  . Not on file  Social History Narrative  . Not on file     No Known Allergies   Outpatient Medications Prior to Visit  Medication Sig Dispense Refill  . albuterol (PROVENTIL HFA;VENTOLIN HFA) 108 (90 Base) MCG/ACT inhaler Inhale 2 puffs into the lungs every 6 (six) hours as needed for wheezing or shortness of breath. 1 Inhaler 11  . apixaban (ELIQUIS) 5 MG TABS tablet Take 1 tablet (5 mg total) by mouth 2 (two) times daily. 180 tablet 1  . arformoterol (BROVANA) 15 MCG/2ML NEBU Take 2 mLs (15 mcg total) by nebulization 2 (two) times daily. 120 mL 6  .  budesonide (PULMICORT) 0.5 MG/2ML nebulizer solution Take 2 mLs (0.5 mg total) by nebulization 2 (two) times daily. 120 mL 5  . diltiazem (CARDIZEM CD) 180 MG 24 hr capsule TAKE 1 CAPSULE EVERY DAY (Patient taking differently: Take 180 mg by mouth daily. ) 90 capsule 2  . feeding supplement, ENSURE ENLIVE, (ENSURE ENLIVE) LIQD Take 237 mLs by mouth 2 (two) times daily between meals. 237 mL  12  . finasteride (PROSCAR) 5 MG tablet Take 5 mg by mouth at bedtime.     . fluticasone (FLONASE) 50 MCG/ACT nasal spray Place 1 spray into both nostrils at bedtime.     Marland Kitchen guaiFENesin (MUCINEX) 600 MG 12 hr tablet Take 1 tablet (600 mg total) by mouth 2 (two) times daily. 30 tablet 0  . hydrocortisone cream 1 % Apply 1 application topically daily as needed (Eczema on face and head).    Marland Kitchen ipratropium-albuterol (DUONEB) 0.5-2.5 (3) MG/3ML SOLN Take 3 mLs by nebulization 3 (three) times daily. 360 mL 0  . Miconazole Nitrate (CRITIC-AID CLEAR AF) 2 % OINT Apply 1 application topically at bedtime.    . Multiple Vitamins-Minerals (MULTIVITAMIN WITH MINERALS) tablet Take 1 tablet by mouth daily.    Marland Kitchen nystatin (MYCOSTATIN) 100000 UNIT/ML suspension Take 6 mLs by mouth 4 (four) times daily.    Marland Kitchen omeprazole (PRILOSEC) 40 MG capsule Take 40 mg by mouth 2 (two) times daily.     . OXYGEN Inhale 2-3 L into the lungs See admin instructions. 2L daytime, 3L at night     . polyethylene glycol (MIRALAX / GLYCOLAX) packet Take 17 g by mouth daily. (Patient taking differently: Take 17 g by mouth at bedtime. Mix in 8 oz liquid and drink) 14 each 1  . rosuvastatin (CRESTOR) 10 MG tablet Take 10 mg by mouth daily.    . sildenafil (REVATIO) 20 MG tablet Take 1 tablet (20 mg total) by mouth 2 (two) times daily. 180 tablet 3  . Spacer/Aero-Holding Chambers (AEROCHAMBER MV) inhaler Use as instructed 1 each 0  . SPIRIVA HANDIHALER 18 MCG inhalation capsule INHALE THE CONTENTS OF 1 CAPSULE EVERY DAY 90 capsule 1  . tamsulosin (FLOMAX) 0.4 MG  CAPS capsule Take 1 capsule (0.4 mg total) by mouth daily after breakfast. 30 capsule 0  . temazepam (RESTORIL) 30 MG capsule Take 1 capsule (30 mg total) by mouth at bedtime. 30 capsule 5  . vitamin C (ASCORBIC ACID) 500 MG tablet Take 500 mg by mouth 2 (two) times daily.     Marland Kitchen VITAMIN D PO Take 1 tablet by mouth daily.     No facility-administered medications prior to visit.     Review of Systems  Constitutional: Negative for chills, fever, malaise/fatigue and weight loss.  HENT: Negative for hearing loss, sore throat and tinnitus.   Eyes: Negative for blurred vision and double vision.  Respiratory: Positive for shortness of breath and wheezing. Negative for cough, hemoptysis, sputum production and stridor.   Cardiovascular: Negative for chest pain, palpitations, orthopnea, leg swelling and PND.  Gastrointestinal: Negative for abdominal pain, constipation, diarrhea, heartburn, nausea and vomiting.  Genitourinary: Negative for dysuria, hematuria and urgency.  Musculoskeletal: Negative for joint pain and myalgias.  Skin: Negative for itching and rash.  Neurological: Negative for dizziness, tingling, weakness and headaches.  Endo/Heme/Allergies: Negative for environmental allergies. Does not bruise/bleed easily.  Psychiatric/Behavioral: Negative for depression. The patient is not nervous/anxious and does not have insomnia.   All other systems reviewed and are negative.    Objective:  Physical Exam Vitals signs reviewed.  Constitutional:      General: He is not in acute distress.    Appearance: He is well-developed.  HENT:     Head: Normocephalic and atraumatic.     Mouth/Throat:     Pharynx: No oropharyngeal exudate.  Eyes:     Conjunctiva/sclera: Conjunctivae normal.     Pupils: Pupils are equal, round, and reactive to  light.  Neck:     Vascular: No JVD.     Trachea: No tracheal deviation.     Comments: Loss of supraclavicular fat Cardiovascular:     Rate and Rhythm: Normal  rate and regular rhythm.     Heart sounds: S1 normal and S2 normal.     Comments: Distant heart tones Pulmonary:     Effort: No tachypnea or accessory muscle usage.     Breath sounds: No stridor. Decreased breath sounds (throughout all lung fields) present. No wheezing, rhonchi or rales.  Abdominal:     General: Bowel sounds are normal. There is no distension.     Palpations: Abdomen is soft.     Tenderness: There is no abdominal tenderness.  Musculoskeletal:        General: Deformity (muscle wasting ) present.  Skin:    General: Skin is warm and dry.     Capillary Refill: Capillary refill takes less than 2 seconds.     Findings: No rash.  Neurological:     Mental Status: He is alert and oriented to person, place, and time.  Psychiatric:        Behavior: Behavior normal.      Vitals:   01/21/19 1147  BP: 110/70  Pulse: 85  SpO2: 90%  Weight: 129 lb 9.6 oz (58.8 kg)  Height: _0  (1.702 m)   90% on 2 LPM  BMI Readings from Last 3 Encounters:  01/21/19 20.30 kg/m  01/07/19 20.20 kg/m  12/27/18 19.92 kg/m   Wt Readings from Last 3 Encounters:  01/21/19 129 lb 9.6 oz (58.8 kg)  01/07/19 129 lb (58.5 kg)  12/27/18 127 lb 3.3 oz (57.7 kg)     CBC    Component Value Date/Time   WBC 8.4 12/30/2018 0902   RBC 4.32 12/30/2018 0902   HGB 14.4 12/30/2018 0902   HCT 43.0 12/30/2018 0902   PLT 151 12/30/2018 0902   MCV 99.5 12/30/2018 0902   MCH 33.3 12/30/2018 0902   MCHC 33.5 12/30/2018 0902   RDW 12.5 12/30/2018 0902   LYMPHSABS 0.7 12/30/2018 0902   MONOABS 0.4 12/30/2018 0902   EOSABS 0.0 12/30/2018 0902   BASOSABS 0.0 12/30/2018 0902    Chest Imaging: Patient has had no prior axial CT imaging.  Pulmonary Functions Testing Results: PFT Results Latest Ref Rng & Units 01/07/2019  FVC-Pre L 2.14  FVC-Predicted Pre % 59  FVC-Post L 2.09  FVC-Predicted Post % 58  Pre FEV1/FVC % % 40  Post FEV1/FCV % % 38  FEV1-Pre L 0.87  FEV1-Predicted Pre % 34    FEV1-Post L 0.80  DLCO UNC% % 24  DLCO COR %Predicted % 44  TLC L 8.03  TLC % Predicted % 124  RV % Predicted % 223    FeNO: None   Pathology: None   Echocardiogram: None   Heart Catheterization: None   ECG:  QTC 421    Assessment & Plan:   Stage 3 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 (HCC) - Plan: azithromycin (ZITHROMAX) 250 MG tablet  Chronic hypoxemic respiratory failure (HCC) - Plan: azithromycin (ZITHROMAX) 250 MG tablet  History of pulmonary hypertension  Immunosuppressed status (HCC)  Current chronic use of systemic steroids  Medication monitoring encounter - Plan: EKG 12-Lead  Discussion:  This is a 78 year old gentleman with severe COPD, last FEV1 37% predicted on prior office primary.  No prior full PFTs.  He has chronic hypoxemic respiratory failure on home O2.  At this time he  has had 2 exacerbations in the past month.  Requiring increased doses of his prednisone as well as antibiotics. I do agree that he is likely to benefit from switching to nebulized therapy. We will continue him on Pulmicort, Brovana as well as scheduled duo nebs. Today in the office we also discussed utility of coming off of the duo nebs and adding Yupelri.  We could also continue his albuterol nebs as needed for shortness of breath and wheezing.  At this time he would like to continue the Pulmicort Brovana and scheduled duo nebs as he states he is feeling much better. He is still taking 10 mg of prednisone. Today we discussed risk benefits and alternatives maintaining on chronic prednisone for the treatment of COPD. At this time we still have the game plan of tapering or coming off of oral prednisone if at all possible. Mainly due to the side effect profile and overall outcome with chronic prednisone use.  We will start to transition him by adding an additional medication.  We will start azithromycin 250 mg Monday Wednesday Friday for frequent exacerbations of COPD as well as severe  COPD management. We discussed risk, benefits and alternatives of the use of azithromycin for the management of COPD.  His prior QTC was 421.  We will obtain a repeat ECG on his next office visit in 4 weeks to document any progression or prolongation of his QTC.  Patient was counseled on this.  We recommend decreasing his prednisone to 8 mg a day once he is able to start his azithromycin.  Patient to return to clinic in 4 weeks.  Greater than 50% of today's 40-minute office visit was spent face-to-face discussing the above recommendations and treatment plan.   Current Outpatient Medications:  .  albuterol (PROVENTIL HFA;VENTOLIN HFA) 108 (90 Base) MCG/ACT inhaler, Inhale 2 puffs into the lungs every 6 (six) hours as needed for wheezing or shortness of breath., Disp: 1 Inhaler, Rfl: 11 .  apixaban (ELIQUIS) 5 MG TABS tablet, Take 1 tablet (5 mg total) by mouth 2 (two) times daily., Disp: 180 tablet, Rfl: 1 .  arformoterol (BROVANA) 15 MCG/2ML NEBU, Take 2 mLs (15 mcg total) by nebulization 2 (two) times daily., Disp: 120 mL, Rfl: 6 .  budesonide (PULMICORT) 0.5 MG/2ML nebulizer solution, Take 2 mLs (0.5 mg total) by nebulization 2 (two) times daily., Disp: 120 mL, Rfl: 5 .  diltiazem (CARDIZEM CD) 180 MG 24 hr capsule, TAKE 1 CAPSULE EVERY DAY (Patient taking differently: Take 180 mg by mouth daily. ), Disp: 90 capsule, Rfl: 2 .  feeding supplement, ENSURE ENLIVE, (ENSURE ENLIVE) LIQD, Take 237 mLs by mouth 2 (two) times daily between meals., Disp: 237 mL, Rfl: 12 .  finasteride (PROSCAR) 5 MG tablet, Take 5 mg by mouth at bedtime. , Disp: , Rfl:  .  fluticasone (FLONASE) 50 MCG/ACT nasal spray, Place 1 spray into both nostrils at bedtime. , Disp: , Rfl:  .  guaiFENesin (MUCINEX) 600 MG 12 hr tablet, Take 1 tablet (600 mg total) by mouth 2 (two) times daily., Disp: 30 tablet, Rfl: 0 .  hydrocortisone cream 1 %, Apply 1 application topically daily as needed (Eczema on face and head)., Disp: , Rfl:    .  ipratropium-albuterol (DUONEB) 0.5-2.5 (3) MG/3ML SOLN, Take 3 mLs by nebulization 3 (three) times daily., Disp: 360 mL, Rfl: 0 .  Miconazole Nitrate (CRITIC-AID CLEAR AF) 2 % OINT, Apply 1 application topically at bedtime., Disp: , Rfl:  .  Multiple  Vitamins-Minerals (MULTIVITAMIN WITH MINERALS) tablet, Take 1 tablet by mouth daily., Disp: , Rfl:  .  nystatin (MYCOSTATIN) 100000 UNIT/ML suspension, Take 6 mLs by mouth 4 (four) times daily., Disp: , Rfl:  .  omeprazole (PRILOSEC) 40 MG capsule, Take 40 mg by mouth 2 (two) times daily. , Disp: , Rfl:  .  OXYGEN, Inhale 2-3 L into the lungs See admin instructions. 2L daytime, 3L at night , Disp: , Rfl:  .  polyethylene glycol (MIRALAX / GLYCOLAX) packet, Take 17 g by mouth daily. (Patient taking differently: Take 17 g by mouth at bedtime. Mix in 8 oz liquid and drink), Disp: 14 each, Rfl: 1 .  rosuvastatin (CRESTOR) 10 MG tablet, Take 10 mg by mouth daily., Disp: , Rfl:  .  sildenafil (REVATIO) 20 MG tablet, Take 1 tablet (20 mg total) by mouth 2 (two) times daily., Disp: 180 tablet, Rfl: 3 .  Spacer/Aero-Holding Chambers (AEROCHAMBER MV) inhaler, Use as instructed, Disp: 1 each, Rfl: 0 .  SPIRIVA HANDIHALER 18 MCG inhalation capsule, INHALE THE CONTENTS OF 1 CAPSULE EVERY DAY, Disp: 90 capsule, Rfl: 1 .  tamsulosin (FLOMAX) 0.4 MG CAPS capsule, Take 1 capsule (0.4 mg total) by mouth daily after breakfast., Disp: 30 capsule, Rfl: 0 .  temazepam (RESTORIL) 30 MG capsule, Take 1 capsule (30 mg total) by mouth at bedtime., Disp: 30 capsule, Rfl: 5 .  vitamin C (ASCORBIC ACID) 500 MG tablet, Take 500 mg by mouth 2 (two) times daily. , Disp: , Rfl:  .  VITAMIN D PO, Take 1 tablet by mouth daily., Disp: , Rfl:  .  [START ON 01/22/2019] azithromycin (ZITHROMAX) 250 MG tablet, Take 1 tablet (250 mg total) by mouth every Monday, Wednesday, and Friday., Disp: 36 tablet, Rfl: 0   Garner Nash, DO [facility] 01/21/2019 1:49 PM

## 2019-01-21 NOTE — Patient Instructions (Signed)
Thank you for visiting Dr. Valeta Harms at [facility]. Today we recommend the following:  Orders Placed This Encounter  Procedures  . EKG 12-Lead   Meds ordered this encounter  Medications  . azithromycin (ZITHROMAX) 250 MG tablet    Sig: Take 1 tablet (250 mg total) by mouth every Monday, Wednesday, and Friday.    Dispense:  36 tablet    Refill:  0   Drop prednisone to 8mg  daily.   Return in about 4 weeks (around 02/18/2019).

## 2019-01-25 NOTE — Progress Notes (Signed)
Cardiology Office Note   Date:  01/26/2019   ID:  Burnard Enis, DOB 1941/10/01, MRN [id number]  PCP:  Lujean Amel, MD  Cardiologist:   Skeet Latch, MD   No chief complaint on file.    History of Present Illness: Naresh Althaus is a 78 y.o. 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  Mr. Conover was admitted 08/2017 with pneumonia.  The hospitalization was complicated by atrial fibrillation with rapid ventricular response.  He had more than one episode of atrial fibrillation during the admission.  It was thought to be due to his pneumonia.  He was already on diltiazem prior to admission and metoprolol was added to his regimen.  He was started on Eliquis for anticoagulation.  Thyroid function was normal.  He had an echo that admission that revealed LVEF 55-60%.  It was noted that he has pulmonary hypertension from severe COPD.  He was previously treated in [place] and was started on sildenafil.  He has been working with Dr. Leeanne Deed the dose was lowered however, the patient declined further weaning.    Mr. Prins saw his pulmonologist and was noted to be anemic.  He had positive FOBT testing.  He was referred to Dr. Michail Sermon and underwent upper and lower endoscopy 04/2018.  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  This was treated with APC.  He also had 2 colonic polyps removed.  Since his last appointment Mr. Noreen has been admitted twice with COPD exacerbations.  He is back on 2 L of supplemental oxygen.  However he feels that his breathing has not yet reached baseline.  He plans to start back going to the gym this week.  At the gym he likes to use the treadmill, step machine, and weight training.  He has no exertional chest pain or pressure.  He has not had  any lower extremity edema, orthopnea, or PND.  He has struggled with some urinary incontinence when he tries to walk quickly and gets overexerted.  He plans to follow-up with his urologist about this.   Past Medical History:  Diagnosis Date  . Anemia   . BiPAP (biphasic positive airway pressure) dependence    Pt denies history of OSA  . BPH (benign prostatic hyperplasia)   . Cancer ([place])    skin  . COPD (chronic obstructive pulmonary disease) ([place])   . Dysphagia   . Emphysema lung ([place])   . GERD (gastroesophageal reflux disease)    " Silent reflux"  . Hearing loss    right ear  . History of hiatal hernia   . HLD (hyperlipidemia)   . Hypertension   . Oxygen dependent    2-3 liters  . Oxygen dependent   . Pneumonia   . Pulmonary hypertension ([place])   . Sleep apnea    wears cpap    Past Surgical History:  Procedure Laterality Date  . CARDIAC CATHETERIZATION    . CATARACT EXTRACTION W/ INTRAOCULAR LENS  IMPLANT, BILATERAL    . COLONOSCOPY WITH PROPOFOL N/A 04/20/2018   Procedure: COLONOSCOPY WITH PROPOFOL;  Surgeon: Wilford Corner, MD;  Location: [facility];  Service: Endoscopy;  Laterality: N/A;  . ESOPHAGOGASTRODUODENOSCOPY (EGD) WITH PROPOFOL N/A 08/23/2016   Procedure: ESOPHAGOGASTRODUODENOSCOPY (EGD) WITH PROPOFOL;  Surgeon: Wilford Corner, MD;  Location[facility] ENDOSCOPY;  Service: Endoscopy;  Laterality: N/A;  . ESOPHAGOGASTRODUODENOSCOPY (EGD) WITH PROPOFOL N/A 04/20/2018   Procedure: ESOPHAGOGASTRODUODENOSCOPY (EGD) WITH PROPOFOL;  Surgeon: Wilford Corner, MD;  Location: [facility];  Service: Endoscopy;  Laterality: N/A;  . HOT HEMOSTASIS N/A 04/20/2018   Procedure: HOT HEMOSTASIS (ARGON PLASMA COAGULATION/BICAP);  Surgeon: Wilford Corner, MD;  Location: [facility];  Service: Endoscopy;  Laterality: N/A;  . LUNG SURGERY    . POLYPECTOMY  04/20/2018   Procedure: POLYPECTOMY;  Surgeon: Wilford Corner, MD;  Location: [facility] ENDOSCOPY;  Service: Endoscopy;;  .  TONSILLECTOMY       Current Outpatient Medications  Medication Sig Dispense Refill  . albuterol (PROVENTIL HFA;VENTOLIN HFA) 108 (90 Base) MCG/ACT inhaler Inhale 2 puffs into the lungs every 6 (six) hours as needed for wheezing or shortness of breath. 1 Inhaler 11  . apixaban (ELIQUIS) 5 MG TABS tablet Take 1 tablet (5 mg total) by mouth 2 (two) times daily. 180 tablet 1  . arformoterol (BROVANA) 15 MCG/2ML NEBU Take 2 mLs (15 mcg total) by nebulization 2 (two) times daily. 120 mL 6  . azithromycin (ZITHROMAX) 250 MG tablet Take 1 tablet (250 mg total) by mouth every Monday, Wednesday, and Friday. 36 tablet 0  . budesonide (PULMICORT) 0.5 MG/2ML nebulizer solution Take 2 mLs (0.5 mg total) by nebulization 2 (two) times daily. 120 mL 5  . diltiazem (CARDIZEM CD) 180 MG 24 hr capsule TAKE 1 CAPSULE EVERY DAY (Patient taking differently: Take 180 mg by mouth daily. ) 90 capsule 2  . feeding supplement, ENSURE ENLIVE, (ENSURE ENLIVE) LIQD Take 237 mLs by mouth 2 (two) times daily between meals. 237 mL 12  . finasteride (PROSCAR) 5 MG tablet Take 5 mg by mouth at bedtime.     . fluticasone (FLONASE) 50 MCG/ACT nasal spray Place 1 spray into both nostrils at bedtime.     Marland Kitchen guaiFENesin (MUCINEX) 600 MG 12 hr tablet Take 1 tablet (600 mg total) by mouth 2 (two) times daily. 30 tablet 0  . hydrocortisone cream 1 % Apply 1 application topically daily as needed (Eczema on face and head).    Marland Kitchen ipratropium-albuterol (DUONEB) 0.5-2.5 (3) MG/3ML SOLN Take 3 mLs by nebulization 3 (three) times daily. 360 mL 0  . Miconazole Nitrate (CRITIC-AID CLEAR AF) 2 % OINT Apply 1 application topically at bedtime.    . Multiple Vitamins-Minerals (MULTIVITAMIN WITH MINERALS) tablet Take 1 tablet by mouth daily.    Marland Kitchen nystatin (MYCOSTATIN) 100000 UNIT/ML suspension Take 6 mLs by mouth 4 (four) times daily.    Marland Kitchen omeprazole (PRILOSEC) 40 MG capsule Take 40 mg by mouth 2 (two) times daily.     . OXYGEN Inhale 2-3 L into the  lungs See admin instructions. 2L daytime, 3L at night     . polyethylene glycol (MIRALAX / GLYCOLAX) packet Take 17 g by mouth daily. (Patient taking differently: Take 17 g by mouth at bedtime. Mix in 8 oz liquid and drink) 14 each 1  . rosuvastatin (CRESTOR) 10 MG tablet Take 10 mg by mouth daily.    . sildenafil (REVATIO) 20 MG tablet Take 1 tablet (20 mg total) by mouth 2 (two) times daily. 180 tablet 3  . Spacer/Aero-Holding Chambers (AEROCHAMBER MV) inhaler Use as instructed 1 each 0  . SPIRIVA HANDIHALER 18 MCG inhalation capsule INHALE THE CONTENTS OF 1 CAPSULE EVERY DAY 90 capsule 1  . tamsulosin (FLOMAX) 0.4 MG CAPS capsule Take 1 capsule (0.4 mg total) by mouth daily after breakfast. 30 capsule 0  . temazepam (  RESTORIL) 30 MG capsule Take 1 capsule (30 mg total) by mouth at bedtime. 30 capsule 5  . vitamin C (ASCORBIC ACID) 500 MG tablet Take 500 mg by mouth 2 (two) times daily.     Marland Kitchen VITAMIN D PO Take 1 tablet by mouth daily.     No current facility-administered medications for this visit.     Allergies:   Patient has no known allergies.    Social History:  The patient  reports that he quit smoking about 34 years ago. His smoking use included cigarettes. He smoked 0.00 packs per day for 0.00 years. He has never used smokeless tobacco. He reports that he does not drink alcohol or use drugs.   Family History:  The patient's family history includes Bone cancer in his brother; Cancer in his maternal uncle; Congestive Heart Failure in his maternal grandfather; Pancreatitis in his mother; Stroke in his maternal grandfather.    ROS:  Please see the history of present illness.   Otherwise, review of systems are positive for none.   All other systems are reviewed and negative.   PHYSICAL EXAM: VS:  BP 124/60 (BP Location: Left Arm, Patient Position: Sitting, Cuff Size: Normal)   Pulse 82   Ht 5\' 7"  (1.702 m)   Wt 133 lb (60.3 kg)   BMI 20.83 kg/m  , BMI Body mass index is 20.83  kg/m. GENERAL:  Well appearing HEENT: Pupils equal round and reactive, fundi not visualized, oral mucosa unremarkable NECK:  No jugular venous distention, waveform within normal limits, carotid upstroke brisk and symmetric, no bruits LUNGS:  Clear to auscultation bilaterally HEART:  RRR.  PMI not displaced or sustained,S1 and S2 within normal limits, no S3, no S4, no clicks, no rubs, no murmurs ABD:  Flat, positive bowel sounds normal in frequency in pitch, no bruits, no rebound, no guarding, no midline pulsatile mass, no hepatomegaly, no splenomegaly EXT:  2 plus pulses throughout, no edema, no cyanosis no clubbing SKIN:  No rashes no nodules NEURO:  Cranial nerves II through XII grossly intact, motor grossly intact throughout PSYCH:  Cognitively intact, oriented to person place and time   EKG:  EKG is ordered today. The ekg ordered 09/22/17 demonstrates sinus rhythm.  Rate 61 bpm.   03/23/2018: Sinus bradycardia.  Rate 53 bpm. 01/26/2019: Sinus rhythm.  PACs.  Rate 82 bpm.  Incomplete right bundle branch block.  Echo 09/10/17: Study Conclusions  - Left ventricle: The cavity size was normal. Wall thickness was normal. Systolic function was normal. The estimated ejection fraction was in the range of 55% to 60%. Wall motion was normal; there were no regional wall motion abnormalities. The study is not technically sufficient to allow evaluation of LV diastolic function. - Pulmonary arteries: PA peak pressure: 33 mm Hg (S). - Pericardium, extracardiac: A trivial pericardial effusion was identified.  Impressions:  - Technically difficult; no apical views; normal LV systolic function; trace MR and mild TR.   Recent Labs: 09/28/2018: Pro B Natriuretic peptide (BNP) 23.0; TSH 0.77 12/27/2018: B Natriuretic Peptide 51.4 12/30/2018: ALT 33; BUN 27; Creatinine, Ser 1.08; Hemoglobin 14.4; Magnesium 2.2; Platelets 151; Potassium 3.7; Sodium 138   12/23/2017: Total cholesterol  117, triglycerides 107, HDL 59, LDL 37  Lipid Panel No results found for: CHOL, TRIG, HDL, CHOLHDL, VLDL, LDLCALC, LDLDIRECT    Wt Readings from Last 3 Encounters:  01/26/19 133 lb (60.3 kg)  01/21/19 129 lb 9.6 oz (58.8 kg)  01/07/19 129 lb (58.5 kg)  ASSESSMENT AND PLAN:  # Paroxysmal atrial fibrillation:  Mr. Ratz remains in sinus rhythm.  He was asymptomatic in atrial fibrillation.  Continue diltiazem and Eliquis.  # Pulmonary hypertension:  Mr. Yom has been on sildenafil since 2007. PASP was only 33 mmHg on echo 09/10/17. This is odd given that he has significant pulmonary disease. Typically this medication would worsen V/Q mismatch in a patient with severe COPD.   However he has been stable so we will not stop it.  # Hyperlipidemia:  LDL 27 on 12/2017.  Continue rosuvastatin.  Repeat lipids/CMP at follow up.  Current medicines are reviewed at length with the patient today.  The patient does not have concerns regarding medicines.  The following changes have been made:  none  Labs/ tests ordered today include:  No orders of the defined types were placed in this encounter.    Disposition:   FU with Hildreth Robart C. Oval Linsey, MD, [facility] in 1 year.  APP in 6 months.     Signed, Haevyn Ury C. Oval Linsey, MD, [facility]  01/26/2019 9:43 AM    [place]

## 2019-01-26 ENCOUNTER — Encounter: Payer: Self-pay | Admitting: Cardiovascular Disease

## 2019-01-26 ENCOUNTER — Ambulatory Visit (INDEPENDENT_AMBULATORY_CARE_PROVIDER_SITE_OTHER): Payer: Medicare Other | Admitting: Cardiovascular Disease

## 2019-01-26 VITALS — BP 124/60 | HR 82 | Ht 67.0 in | Wt 133.0 lb

## 2019-01-26 DIAGNOSIS — N3941 Urge incontinence: Secondary | ICD-10-CM | POA: Diagnosis not present

## 2019-01-26 DIAGNOSIS — I480 Paroxysmal atrial fibrillation: Secondary | ICD-10-CM

## 2019-01-26 DIAGNOSIS — E785 Hyperlipidemia, unspecified: Secondary | ICD-10-CM

## 2019-01-26 NOTE — Patient Instructions (Signed)
Medication Instructions:  Your physician recommends that you continue on your current medications as directed. Please refer to the Current Medication list given to you today.  If you need a refill on your cardiac medications before your next appointment, please call your pharmacy.   Lab work: NONE   Testing/Procedures: NONE  Follow-Up: Your physician wants you to follow-up in: [place] will receive a reminder letter in the mail two months in advance. If you don't receive a letter, please call our office to schedule the follow-up appointment.  Your physician wants you to follow-up in: [place] will receive a reminder letter in the mail two months in advance. If you don't receive a letter, please call our office to schedule the follow-up appointment.

## 2019-01-27 DIAGNOSIS — I10 Essential (primary) hypertension: Secondary | ICD-10-CM | POA: Diagnosis not present

## 2019-01-27 DIAGNOSIS — J9611 Chronic respiratory failure with hypoxia: Secondary | ICD-10-CM | POA: Diagnosis not present

## 2019-01-27 DIAGNOSIS — I272 Pulmonary hypertension, unspecified: Secondary | ICD-10-CM | POA: Diagnosis not present

## 2019-01-27 DIAGNOSIS — G4733 Obstructive sleep apnea (adult) (pediatric): Secondary | ICD-10-CM | POA: Diagnosis not present

## 2019-01-27 DIAGNOSIS — I480 Paroxysmal atrial fibrillation: Secondary | ICD-10-CM | POA: Diagnosis not present

## 2019-01-27 DIAGNOSIS — J439 Emphysema, unspecified: Secondary | ICD-10-CM | POA: Diagnosis not present

## 2019-01-30 DIAGNOSIS — Z7951 Long term (current) use of inhaled steroids: Secondary | ICD-10-CM | POA: Diagnosis not present

## 2019-01-30 DIAGNOSIS — I480 Paroxysmal atrial fibrillation: Secondary | ICD-10-CM | POA: Diagnosis not present

## 2019-01-30 DIAGNOSIS — Z9981 Dependence on supplemental oxygen: Secondary | ICD-10-CM | POA: Diagnosis not present

## 2019-01-30 DIAGNOSIS — I10 Essential (primary) hypertension: Secondary | ICD-10-CM | POA: Diagnosis not present

## 2019-01-30 DIAGNOSIS — Z87891 Personal history of nicotine dependence: Secondary | ICD-10-CM | POA: Diagnosis not present

## 2019-01-30 DIAGNOSIS — J439 Emphysema, unspecified: Secondary | ICD-10-CM | POA: Diagnosis not present

## 2019-01-30 DIAGNOSIS — J9611 Chronic respiratory failure with hypoxia: Secondary | ICD-10-CM | POA: Diagnosis not present

## 2019-01-30 DIAGNOSIS — Z7952 Long term (current) use of systemic steroids: Secondary | ICD-10-CM | POA: Diagnosis not present

## 2019-01-30 DIAGNOSIS — Z7901 Long term (current) use of anticoagulants: Secondary | ICD-10-CM | POA: Diagnosis not present

## 2019-01-30 DIAGNOSIS — G4733 Obstructive sleep apnea (adult) (pediatric): Secondary | ICD-10-CM | POA: Diagnosis not present

## 2019-01-30 DIAGNOSIS — Z902 Acquired absence of lung [part of]: Secondary | ICD-10-CM | POA: Diagnosis not present

## 2019-01-30 DIAGNOSIS — I272 Pulmonary hypertension, unspecified: Secondary | ICD-10-CM | POA: Diagnosis not present

## 2019-02-02 ENCOUNTER — Telehealth: Payer: Self-pay | Admitting: Pulmonary Disease

## 2019-02-02 NOTE — Telephone Encounter (Signed)
Called and spoke with patient, he stated that he had dropped off [employer] forms over a month ago to be filled out by [employer]. Patient stated that he "gave this to the lady who checks people in up front". Patients to know the status of this. I do not see any phone note where the patient dropped anything off in regards to [employer]. I also do not see that it was sent to scan.   [place] please advise if you eve received forms on this patient. Thank you.

## 2019-02-02 NOTE — Telephone Encounter (Signed)
No form has been received. Call made to patient, made aware I have not gotten any form. Patient was a little upset but stated he would obtain another form and bring it by. Nothing further is needed at this time.

## 2019-02-03 DIAGNOSIS — I480 Paroxysmal atrial fibrillation: Secondary | ICD-10-CM | POA: Diagnosis not present

## 2019-02-03 DIAGNOSIS — G4733 Obstructive sleep apnea (adult) (pediatric): Secondary | ICD-10-CM | POA: Diagnosis not present

## 2019-02-03 DIAGNOSIS — J439 Emphysema, unspecified: Secondary | ICD-10-CM | POA: Diagnosis not present

## 2019-02-03 DIAGNOSIS — I10 Essential (primary) hypertension: Secondary | ICD-10-CM | POA: Diagnosis not present

## 2019-02-03 DIAGNOSIS — I272 Pulmonary hypertension, unspecified: Secondary | ICD-10-CM | POA: Diagnosis not present

## 2019-02-03 DIAGNOSIS — J9611 Chronic respiratory failure with hypoxia: Secondary | ICD-10-CM | POA: Diagnosis not present

## 2019-02-11 ENCOUNTER — Telehealth: Payer: Self-pay | Admitting: Pulmonary Disease

## 2019-02-11 MED ORDER — PREDNISONE 1 MG PO TABS: 3.0000 mg | ORAL_TABLET | Freq: Every day | ORAL | 1 refills | Status: DC

## 2019-02-11 MED ORDER — PREDNISONE 1 MG PO TABS: 1.0000 mg | ORAL_TABLET | Freq: Every day | ORAL | 1 refills | Status: DC

## 2019-02-11 MED ORDER — PREDNISONE 5 MG PO TABS: 5.0000 mg | ORAL_TABLET | Freq: Every day | ORAL | 1 refills | Status: DC

## 2019-02-11 NOTE — Telephone Encounter (Signed)
Instructions  Return in about 4 weeks (around 02/18/2019).  Thank you for visiting Dr. Valeta Harms at [facility]. Today we recommend the following:     Orders Placed This Encounter  Procedures  . EKG 12-Lead       Meds ordered this encounter  Medications  . azithromycin (ZITHROMAX) 250 MG tablet    Sig: Take 1 tablet (250 mg total) by mouth every Monday, Wednesday, and Friday.    Dispense:  36 tablet    Refill:  0   Drop prednisone to 8mg  daily.   Return in about 4 weeks (around 02/18/2019).      I have sent an Rx of 1mg  prednisone and also Rx of 5mg  prednisone to pt's pharmacy so he can be able to have it to take 8mg  prednisone daily per Dr. Valeta Harms.  Called and spoke with pt letting him know this had been done. Pt expressed understanding. Nothing further needed.

## 2019-02-11 NOTE — Telephone Encounter (Signed)
Pt requesting refill of Prednisone: Prednisone is not on patient's medication list. Last office visit note mentions 8 mg of Prednisone after starting Azithromycin. Pt has started Azithromycin. Is it ok to fill?

## 2019-02-11 NOTE — Telephone Encounter (Signed)
PCCM: Yes, ok to refill prednisone. He is currently tapering his oral prednisone Andrew Nash, DO [facility] 02/11/2019 11:33 AM

## 2019-02-11 NOTE — Telephone Encounter (Signed)
Pt notified that 1mg s and 5 mgs of Prednisone sent to his pharmacy. Nothing further needed.

## 2019-02-17 NOTE — Progress Notes (Signed)
Virtual Visit via Telephone Note  I connected with Andrew [name] on 02/18/19 at 11:30 AM EDT by telephone and verified that I am speaking with the correct person using two identifiers.   I discussed the limitations, risks, security and privacy concerns of performing an evaluation and management service by telephone and the availability of in person appointments. I also discussed with the patient that there may be a patient responsible charge related to this service. The patient expressed understanding and agreed to proceed.  02/18/2019 - 6237 - Attempted, pt requested to be called later 02/18/2019 - 1111 - Reached  History of Present Illness: 78 year old male former smoker followed in our office for severe COPD and chronic hypoxic respiratory failure Maintenance: Pulmicort, Brovana, scheduled duo nebs, 8 mg of prednisone daily, azithromycin Monday Wednesday Friday He is followed by Dr. Valeta Harms  Patient consented to consult via telephone: People present and their role in pt care:  Chief complaint: Severe COPD Gold 75  78 year old male followed in our office for severe COPD Gold 3.  Patient is maintained on Pulmicort, Brovana and DuoNeb's.  Patient reports he has not been having to use his DuoNeb's as much lately.  He is requesting a refill today.  Patient reports he is continue to take his azithromycin Monday Wednesday Friday as well as his daily prednisone dose of 8 mg.  Patient denies increased shortness of breath or wheezing.  He has been working on increasing his physical activity.  Overall patient feels that he has been doing well.  He feels that his breathing is stable.  At next office visit patient will need an EKG for management of azithromycin.  MMRC - Breathlessness Score 3 - I stop for breath after walking about 100 yards or after a few minutes on level ground (isle at grocery store is 12ft)    Observations/Objective:  Chest Imaging: Patient has had no prior axial CT  imaging.  Pulmonary Functions Testing Results: PFT Results Latest Ref Rng & Units 01/07/2019  FVC-Pre L 2.14  FVC-Predicted Pre % 59  FVC-Post L 2.09  FVC-Predicted Post % 58  Pre FEV1/FVC % % 40  Post FEV1/FCV % % 38  FEV1-Pre L 0.87  FEV1-Predicted Pre % 34  FEV1-Post L 0.80  DLCO UNC% % 24  DLCO COR %Predicted % 44  TLC L 8.03  TLC % Predicted % 124  RV % Predicted % 223    FeNO: None   Pathology: None   Echocardiogram: None   Heart Catheterization: None   ECG:  QTC 421  Assessment and Plan:  Stage 3 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 (HCC) Assessment: Severe COPD with bullous emphysema Oxygen dependent Prone to exacerbations Maintained well on Brovana, Pulmicort, duo nebs and Monday Wednesday Friday azithromycin Continues to be maintained on 8 mg of prednisone mMRC 3 today  Plan: Continue Brovana Continue Pulmicort Continue duo nebs Continue Monday Wednesday Friday azithromycin Continue 8 mg of prednisone daily, on 02/22/2019 decrease to 7 mg of prednisone daily, on 03/08/2019 please decrease to 6 mg of prednisone daily, hold at this dose until next outreach Follow-up with our office in 6 weeks telephonically  Pulmonary hypertension Assessment: Likely WHO group 3  Plan: Continue sildenafil at this time Could consider decreasing dose in the future some not sure there is clinical benefit for the patient be maintained at this time  Chronic hypoxemic respiratory failure ([place]) Plan: Continue oxygen therapy as prescribed Can  OSA on CPAP Plan: Continue CPAP as prescribed  Follow Up  Instructions:  Return in about 6 weeks (around 04/01/2019), or if symptoms worsen or fail to improve, for Follow up with Wyn Quaker FNP-C.  I discussed the assessment and treatment plan with the patient. The patient was provided an opportunity to ask questions and all were answered. The patient agreed with the plan and demonstrated an understanding of the instructions.   The  patient was advised to call back or seek an in-person evaluation if the symptoms worsen or if the condition fails to improve as anticipated.  I provided 22 minutes of non-face-to-face time during this encounter.   [name], NP

## 2019-02-18 ENCOUNTER — Other Ambulatory Visit: Payer: Self-pay

## 2019-02-18 ENCOUNTER — Encounter: Payer: Self-pay | Admitting: Pulmonary Disease

## 2019-02-18 ENCOUNTER — Telehealth: Payer: Self-pay | Admitting: Pulmonary Disease

## 2019-02-18 ENCOUNTER — Ambulatory Visit: Payer: Medicare Other | Admitting: Pulmonary Disease

## 2019-02-18 ENCOUNTER — Ambulatory Visit (INDEPENDENT_AMBULATORY_CARE_PROVIDER_SITE_OTHER): Payer: Medicare Other | Admitting: Pulmonary Disease

## 2019-02-18 DIAGNOSIS — Z9989 Dependence on other enabling machines and devices: Secondary | ICD-10-CM | POA: Diagnosis not present

## 2019-02-18 DIAGNOSIS — I272 Pulmonary hypertension, unspecified: Secondary | ICD-10-CM

## 2019-02-18 DIAGNOSIS — G4733 Obstructive sleep apnea (adult) (pediatric): Secondary | ICD-10-CM | POA: Diagnosis not present

## 2019-02-18 DIAGNOSIS — J9611 Chronic respiratory failure with hypoxia: Secondary | ICD-10-CM | POA: Diagnosis not present

## 2019-02-18 DIAGNOSIS — J449 Chronic obstructive pulmonary disease, unspecified: Secondary | ICD-10-CM

## 2019-02-18 MED ORDER — PREDNISONE 1 MG PO TABS: 3.0000 mg | ORAL_TABLET | Freq: Every day | ORAL | 1 refills | Status: DC

## 2019-02-18 MED ORDER — IPRATROPIUM-ALBUTEROL 0.5-2.5 (3) MG/3ML IN SOLN: 3.0000 mL | Freq: Three times a day (TID) | RESPIRATORY_TRACT | 3 refills | Status: DC

## 2019-02-18 MED ORDER — IPRATROPIUM-ALBUTEROL 0.5-2.5 (3) MG/3ML IN SOLN: 3.0000 mL | Freq: Three times a day (TID) | RESPIRATORY_TRACT | 3 refills | Status: DC | PRN

## 2019-02-18 NOTE — Telephone Encounter (Signed)
Patient would like to send the script to [facility] I have sent this nothing further is needed at this time.

## 2019-02-18 NOTE — Assessment & Plan Note (Signed)
Plan: Continue CPAP as prescribed

## 2019-02-18 NOTE — Telephone Encounter (Signed)
The patient is on scheduled duo nebs.  I believe she is incorrect.  Please contact the patient and let him know that [facility] cannot process this order.  Offer to send it through a different pharmacy.  It 3 times a day as needed for shortness of breath and wheezing.  Wyn Quaker, FNP

## 2019-02-18 NOTE — Assessment & Plan Note (Signed)
Assessment: Severe COPD with bullous emphysema Oxygen dependent Prone to exacerbations Maintained well on Brovana, Pulmicort, duo nebs and Monday Wednesday Friday azithromycin Continues to be maintained on 8 mg of prednisone mMRC 3 today  Plan: Continue Brovana Continue Pulmicort Continue duo nebs Continue Monday Wednesday Friday azithromycin Continue 8 mg of prednisone daily, on 02/22/2019 decrease to 7 mg of prednisone daily, on 03/08/2019 please decrease to 6 mg of prednisone daily, hold at this dose until next outreach Follow-up with our office in 6 weeks telephonically

## 2019-02-18 NOTE — Assessment & Plan Note (Signed)
Plan: Continue oxygen therapy as prescribed Can

## 2019-02-18 NOTE — Assessment & Plan Note (Signed)
Assessment: Likely WHO group 3  Plan: Continue sildenafil at this time Could consider decreasing dose in the future some not sure there is clinical benefit for the patient be maintained at this time

## 2019-02-18 NOTE — Patient Instructions (Addendum)
Continue Brovana twice daily as prescribed Continue Pulmicort twice daily as prescribed >>> Rinse mouth out after use  Can continue to use DuoNeb nebulized medications every 8 hours >>> We have refilled these nebulized medications and sent to [facility]  Continue Monday Wednesday Friday azithromycin  Continue 8 mg daily of prednisone until 02/22/2019 >>> On 02/22/2019 please decrease dose to 7 mg daily of prednisone >>> On 03/08/2019 please decrease dose to 6 mg daily prednisone, hold at this dose until next telephonic outreach with our office  Take the prednisone in the morning, take with food  We have refilled the prednisone    Note your daily symptoms > remember "red flags" for COPD:   >>>Increase in cough >>>increase in sputum production >>>increase in shortness of breath or activity  intolerance.   If you notice these symptoms, please call the office to be seen.    Please complete the patient assistance forms including this envelope for your sildenafil Please drop off both of your patient assistance forms when completed at our office or you can mail back with the attention to Dr. Valeta Harms  Continue oxygen therapy as prescribed  >>>maintain oxygen saturations greater than 88 percent  >>>if unable to maintain oxygen saturations please contact the office  >>>do not smoke with oxygen  >>>can use nasal saline gel or nasal saline rinses to moisturize nose if oxygen causes dryness    We recommend that you continue using your CPAP daily >>>Keep up the hard work using your device >>> Goal should be wearing this for the entire night that you are sleeping, at least 4 to 6 hours  Remember:  . Do not drive or operate heavy machinery if tired or drowsy.  . Please notify the supply company and office if you are unable to use your device regularly due to missing supplies or machine being broken.  . Work on maintaining a healthy weight and following your recommended nutrition plan   . Maintain proper daily exercise and movement  . Maintaining proper use of your device can also help improve management of other chronic illnesses such as: Blood pressure, blood sugars, and weight management.   BiPAP/ CPAP Cleaning:  >>>Clean weekly, with Dawn soap, and bottle brush.  Set up to air dry.    Return in about 6 weeks (around 04/01/2019), or if symptoms worsen or fail to improve, for Follow up with Wyn Quaker FNP-C.    Coronavirus (COVID-19) Are you at risk?  Are you at risk for the Coronavirus (COVID-19)?  To be considered HIGH RISK for Coronavirus (COVID-19), you have to meet the following criteria:  . Traveled to [place], [place], [place], [place] or [place]; or in the [place] to [place], [place], [place], or [place]; and have fever, cough, and shortness of breath within the last 2 weeks of travel OR . Been in close contact with a person diagnosed with COVID-19 within the last 2 weeks and have fever, cough, and shortness of breath . IF YOU DO NOT MEET THESE CRITERIA, YOU ARE CONSIDERED LOW RISK FOR COVID-19.  What to do if you are HIGH RISK for COVID-19?  Marland Kitchen If you are having a medical emergency, call 911. . Seek medical care right away. Before you go to a doctor's office, urgent care or emergency department, call ahead and tell them about your recent travel, contact with someone diagnosed with COVID-19, and your symptoms. You should receive instructions from your physician's office regarding next steps of care.  Marland Kitchen  When you arrive at healthcare provider, tell the healthcare staff immediately you have returned from visiting [place], [place], [place], [place] or [place]; or traveled in the [place] to [place], [place], [place], or [place]; in the last two weeks or you have been in close contact with a person diagnosed with COVID-19 in the last 2 weeks.   . Tell the health care staff about your symptoms: fever, cough and shortness of breath. . After you  have been seen by a medical provider, you will be either: o Tested for (COVID-19) and discharged home on quarantine except to seek medical care if symptoms worsen, and asked to  - Stay home and avoid contact with others until you get your results (4-5 days)  - Avoid travel on public transportation if possible (such as bus, train, or airplane) or o Sent to the Emergency Department by EMS for evaluation, COVID-19 testing, and possible admission depending on your condition and test results.  What to do if you are LOW RISK for COVID-19?  Reduce your risk of any infection by using the same precautions used for avoiding the common cold or flu:  Marland Kitchen Wash your hands often with soap and warm water for at least 20 seconds.  If soap and water are not readily available, use an alcohol-based hand sanitizer with at least 60% alcohol.  . If coughing or sneezing, cover your mouth and nose by coughing or sneezing into the elbow areas of your shirt or coat, into a tissue or into your sleeve (not your hands). . Avoid shaking hands with others and consider head nods or verbal greetings only. . Avoid touching your eyes, nose, or mouth with unwashed hands.  . Avoid close contact with people who are sick. . Avoid places or events with large numbers of people in one location, like concerts or sporting events. . Carefully consider travel plans you have or are making. . If you are planning any travel outside or inside the [place], visit the CDC's Travelers' Health webpage for the latest health notices. . If you have some symptoms but not all symptoms, continue to monitor at home and seek medical attention if your symptoms worsen. . If you are having a medical emergency, call 911.   [place] / e-Visit: [email or web address]         [facility]: [facility]: [phone]                    [facility]: [phone]           It is flu season:   >>> Best ways to protect herself from the flu: Receive the yearly flu vaccine, practice good hand hygiene washing with soap and also using hand sanitizer when available, eat a nutritious meals, get adequate rest, hydrate appropriately   Please contact the office if your symptoms worsen or you have concerns that you are not improving.   Thank you for choosing [facility] for your healthcare, and for allowing [place] to partner with you on your healthcare journey. I am thankful to be able to provide care to you today.   Wyn Quaker FNP-C     Chronic Obstructive Pulmonary Disease Chronic obstructive pulmonary disease (COPD) is a long-term (chronic) lung problem. When you have COPD, it is hard for air to get in and out of your lungs. Usually the condition gets worse over time, and  your lungs will never return to normal. There are things you can do to keep yourself as healthy as possible.  Your doctor may treat your condition with: ? Medicines. ? Oxygen. ? Lung surgery.  Your doctor may also recommend: ? Rehabilitation. This includes steps to make your body work better. It may involve a team of specialists. ? Quitting smoking, if you smoke. ? Exercise and changes to your diet. ? Comfort measures (palliative care). Follow these instructions at home: Medicines  Take over-the-counter and prescription medicines only as told by your doctor.  Talk to your doctor before taking any cough or allergy medicines. You may need to avoid medicines that cause your lungs to be dry. Lifestyle  If you smoke, stop. Smoking makes the problem worse. If you need help quitting, ask your doctor.  Avoid being around things that make your breathing worse. This may include smoke, chemicals, and fumes.  Stay active, but remember to rest as well.  Learn and use tips on how to relax.  Make sure you get enough sleep.  Most adults need at least 7 hours of sleep every night.  Eat healthy foods. Eat smaller meals more often. Rest before meals. Controlled breathing Learn and use tips on how to control your breathing as told by your doctor. Try:  Breathing in (inhaling) through your nose for 1 second. Then, pucker your lips and breath out (exhale) through your lips for 2 seconds.  Putting one hand on your belly (abdomen). Breathe in slowly through your nose for 1 second. Your hand on your belly should move out. Pucker your lips and breathe out slowly through your lips. Your hand on your belly should move in as you breathe out.  Controlled coughing Learn and use controlled coughing to clear mucus from your lungs. Follow these steps: 1. Lean your head a little forward. 2. Breathe in deeply. 3. Try to hold your breath for 3 seconds. 4. Keep your mouth slightly open while coughing 2 times. 5. Spit any mucus out into a tissue. 6. Rest and do the steps again 1 or 2 times as needed. General instructions  Make sure you get all the shots (vaccines) that your doctor recommends. Ask your doctor about a flu shot and a pneumonia shot.  Use oxygen therapy and pulmonary rehabilitation if told by your doctor. If you need home oxygen therapy, ask your doctor if you should buy a tool to measure your oxygen level (oximeter).  Make a COPD action plan with your doctor. This helps you to know what to do if you feel worse than usual.  Manage any other conditions you have as told by your doctor.  Avoid going outside when it is very hot, cold, or humid.  Avoid people who have a sickness you can catch (contagious).  Keep all follow-up visits as told by your doctor. This is important. Contact a doctor if:  You cough up more mucus than usual.  There is a change in the color or thickness of the mucus.  It is harder to breathe than usual.  Your breathing is faster than usual.  You have trouble sleeping.  You need to use  your medicines more often than usual.  You have trouble doing your normal activities such as getting dressed or walking around the house. Get help right away if:  You have shortness of breath while resting.  You have shortness of breath that stops you from: ? Being able to talk. ? Doing normal activities.  Your  chest hurts for longer than 5 minutes.  Your skin color is more blue than usual.  Your pulse oximeter shows that you have low oxygen for longer than 5 minutes.  You have a fever.  You feel too tired to breathe normally. Summary  Chronic obstructive pulmonary disease (COPD) is a long-term lung problem.  The way your lungs work will never return to normal. Usually the condition gets worse over time. There are things you can do to keep yourself as healthy as possible.  Take over-the-counter and prescription medicines only as told by your doctor.  If you smoke, stop. Smoking makes the problem worse. This information is not intended to replace advice given to you by your health care provider. Make sure you discuss any questions you have with your health care provider. Document Released: 04/22/2008 Document Revised: 12/09/2016 Document Reviewed: 12/09/2016 Elsevier Interactive Patient Education  2019 [place] Oxygen Use, Adult When a medical condition keeps you from getting enough oxygen, your health care provider may instruct you to take extra oxygen at home. Your health care provider will let you know:  When to take oxygen.  For how long to take oxygen.  How quickly oxygen should be delivered (flow rate), in liters per minute (LPM or L/M). Home oxygen can be given through:  A mask.  A nasal cannula. This is a device or tube that goes in the nostrils.  A transtracheal catheter. This is a small, flexible tube placed in the trachea.  A tracheostomy. This is a surgically made opening in the trachea. These devices are connected with tubing to an oxygen  source, such as:  A tank. Tanks hold oxygen in gas form. They must be replaced when the oxygen is used up.  A liquid oxygen device. This holds oxygen in liquid form. It must be replaced when the oxygen is used up.  An oxygen concentrator machine. This filters oxygen in the room. It uses electricity, so you must have a backup cylinder of oxygen in case the power goes out. Supplies needed: To use oxygen, you will need:  A mask, nasal cannula, transtracheal catheter, or tracheostomy.  An oxygen tank, a liquid oxygen device, or an oxygen concentrator.  The tape that your health care provider recommends (optional). If you use a transtracheal catheter and your prescribed flow rate is 1 LPM or greater, you will also need a humidifier. Risks and complications  Fire. This can happen if the oxygen is exposed to a heat source, flame, or spark.  Injury to skin. This can happen if liquid oxygen touches your skin.  Organ damage. This can happen if you get too little oxygen. How to use oxygen Your health care provider or a representative from your [place] will show you how to use your oxygen device. Follow her or his instructions. The instructions may look something like this: 1. Wash your hands. 2. If you use an oxygen concentrator, make sure it is plugged in. 3. Place one end of the tube into the port on the tank, device, or machine. 4. Place the mask over your nose and mouth. Or, place the nasal cannula and secure it with tape if instructed. If you use a tracheostomy or transtracheal catheter, connect it to the oxygen source as directed. 5. Make sure the liter-flow setting on the machine is at the level prescribed by your health care provider. 6. Turn on the machine or adjust the knob on the tank or device to  the correct liter-flow setting. 7. When you are done, turn off and unplug the machine, or turn the knob to OFF. How to clean and care for the oxygen supplies Nasal  cannula  Clean it with a warm, wet cloth daily or as needed.  Wash it with a liquid soap once a week.  Rinse it thoroughly once or twice a week.  Replace it every 2-4 weeks.  If you have an infection, such as a cold or pneumonia, change the cannula when you get better. Mask  Replace it every 2-4 weeks.  If you have an infection, such as a cold or pneumonia, change the mask when you get better. Humidifier bottle  Wash the bottle between each refill: ? Wash it with soap and warm water. ? Rinse it thoroughly. ? Disinfect it and its top. ? Air-dry it.  Make sure it is dry before you refill it. Oxygen concentrator  Clean the air filter at least twice a week according to directions from your home medical equipment and service company.  Wipe down the cabinet every day. To do this: ? Unplug the unit. ? Wipe down the cabinet with a damp cloth. ? Dry the cabinet. Other equipment  Change any extra tubing every 1-3 months.  Follow instructions from your health care provider about taking care of any other equipment. Safety tips Fire safety tips   Keep your oxygen and oxygen supplies at least 5 ft away from sources of heat, flames, and sparks at all times.  Do not allow smoking near your oxygen. Put up "no smoking" signs in your home. Avoid smoking areas when in public.  Do not use materials that can burn (are flammable) while you use oxygen.  When you go to a restaurant with portable oxygen, ask to be seated in the nonsmoking section.  Keep a Data processing manager close by. Let your fire department know that you have oxygen in your home.  Test your home smoke detectors regularly. Traveling  Secure your oxygen tank in the vehicle so that it does not move around. Follow instructions from your medical device company about how to safely secure your tank.  Make sure you have enough oxygen for the amount of time you will be away from home.  If you are planning air travel, contact  the airline to find out if they allow the use of an approved portable oxygen concentrator. You may also need documents from your health care provider and medical device company before you travel. General safety tips  If you use an oxygen cylinder, make sure it is in a stand or secured to an object that will not move (fixed object).  If you use liquid oxygen, make sure its container is kept upright.  If you use an oxygen concentrator: ? Dance movement psychotherapist company. Make sure you are given priority service in the event that your power goes out. ? Avoid using extension cords, if possible. Follow these instructions at home:  Use oxygen only as told by your health care provider.  Do not use alcohol or other drugs that make you relax (sedating drugs) unless instructed. They can slow down your breathing rate and make it hard to get in enough oxygen.  Know how and when to order a refill of oxygen.  Always keep a spare tank of oxygen. Plan ahead for holidays when you may not be able to get a prescription filled.  Use water-based lubricants on your lips or nostrils. Do not use oil-based products like  petroleum jelly.  To prevent skin irritation on your cheeks or behind your ears, tuck some gauze under the tubing. Contact a health care provider if:  You get headaches often.  You have shortness of breath.  You have a lasting cough.  You have anxiety.  You are sleepy all the time.  You develop an illness that affects your breathing.  You cannot exercise at your regular level.  You are restless.  You have difficult or irregular breathing, and it is getting worse.  You have a fever.  You have persistent redness under your nose. Get help right away if:  You are confused.  You have blue lips or fingernails.  You are struggling to breathe. Summary  Your health care provider or a representative from your [place] will show you how to use your oxygen device. Follow her  or his instructions.  If you use an oxygen concentrator, make sure it is plugged in.  Make sure the liter-flow setting on the machine is at the level prescribed by your health care provider.  Keep your oxygen and oxygen supplies at least 5 ft away from sources of heat, flames, and sparks at all times. This information is not intended to replace advice given to you by your health care provider. Make sure you discuss any questions you have with your health care provider. Document Released: 01/25/2004 Document Revised: 04/23/2018 Document Reviewed: 05/28/2016 Elsevier Interactive Patient Education  2019 [employer].

## 2019-02-18 NOTE — Telephone Encounter (Signed)
Tiffany from [facility] states that the Duo neb can not be a three times day medication. I would needed to state once daily as needed. She stated this was because of the patient being on Brovana.  BM Please advise what would like to do.

## 2019-02-21 NOTE — Progress Notes (Signed)
PCCM: Agree. Thanks for calling. Garner Nash, DO [facility] 02/21/2019 3:44 PM

## 2019-02-22 ENCOUNTER — Telehealth: Payer: Self-pay | Admitting: Pulmonary Disease

## 2019-02-22 DIAGNOSIS — J449 Chronic obstructive pulmonary disease, unspecified: Secondary | ICD-10-CM

## 2019-02-22 MED ORDER — IPRATROPIUM-ALBUTEROL 0.5-2.5 (3) MG/3ML IN SOLN: 3.0000 mL | Freq: Three times a day (TID) | RESPIRATORY_TRACT | 3 refills | Status: DC | PRN

## 2019-02-22 NOTE — Telephone Encounter (Signed)
LMTCB Duoneb was sent to [facility] mail order on 02/18/19. Need to know if patient still needs rx to go to local [facility].

## 2019-02-22 NOTE — Telephone Encounter (Signed)
Pt called back stating ins denied duoneb. Called [facility] spoke with pharmacist who will re-run claim thru Medicare Part B. Called patient and left a voicemail to check with pharmacy later today. nothing further needed.

## 2019-02-22 NOTE — Telephone Encounter (Signed)
Pt returned call Pt called [employer] to check status of refill Duoneb. [employer] can not run under Part B Pt request rx be sent to local [facility]. Rx sent Nothing further needed.

## 2019-03-01 ENCOUNTER — Telehealth: Payer: Self-pay

## 2019-03-01 ENCOUNTER — Telehealth: Payer: Self-pay | Admitting: Pulmonary Disease

## 2019-03-01 DIAGNOSIS — I272 Pulmonary hypertension, unspecified: Secondary | ICD-10-CM

## 2019-03-01 DIAGNOSIS — R3915 Urgency of urination: Secondary | ICD-10-CM | POA: Diagnosis not present

## 2019-03-01 DIAGNOSIS — J9611 Chronic respiratory failure with hypoxia: Secondary | ICD-10-CM

## 2019-03-01 DIAGNOSIS — Z8679 Personal history of other diseases of the circulatory system: Secondary | ICD-10-CM

## 2019-03-01 DIAGNOSIS — N3941 Urge incontinence: Secondary | ICD-10-CM | POA: Diagnosis not present

## 2019-03-01 DIAGNOSIS — R0600 Dyspnea, unspecified: Secondary | ICD-10-CM

## 2019-03-01 DIAGNOSIS — J438 Other emphysema: Secondary | ICD-10-CM

## 2019-03-01 MED ORDER — DOXYCYCLINE HYCLATE 100 MG PO TABS: 100.0000 mg | ORAL_TABLET | Freq: Two times a day (BID) | ORAL | 0 refills | Status: DC

## 2019-03-01 MED ORDER — PREDNISONE 10 MG PO TABS: ORAL_TABLET | ORAL | 0 refills | Status: DC

## 2019-03-01 NOTE — Telephone Encounter (Signed)
Can offer:   Prednisone 10mg  tablet  >>>4 tabs for 2 days, then 3 tabs for 2 days, 2 tabs for 2 days, then 1 tab for 2 days, then stop >>>take with food  >>>take in the morning   Doxycycline >>> 1 100 mg tablet every 12 hours for 7 days >>>take with food  >>>wear sunscreen   Please place orders  Please ensure the patient has a 2-week telephonic outreach follow-up with me to ensure symptoms are improving.  Please let patient know that if symptoms not improving into this regimen he needs to notify our office and may need to present to the emergency room for further evaluation.  If shortness of breath is not improving and patient does need to go to an emergency room.  We would prefer the patient either visit [facility] (1st choice) or [facility] emergency room (2nd choice) as those are locations that we are trying to non COVID19 patients too. Suspected or positive COVID19 patients are being routed to [facility].   Wyn Quaker, FNP

## 2019-03-01 NOTE — Telephone Encounter (Signed)
Spoke with patient. He has agreed to take the Prednisone taper and doxycycline (will stop the zpak). RX has been called into [facility] in [place].   Attempted to get patient scheduled for a F/U in 2 weeks but he decided to keep his appt on 5/7 since the f/u would be just one week earlier. Advised patient to call us if he needed [place] before then, he verbalized understanding.   Nothing further needed at time of call.

## 2019-03-01 NOTE — Telephone Encounter (Signed)
Daughter came by office to drop off paper work. Will route message to myself to f/u once paper work has been addressed.

## 2019-03-01 NOTE — Telephone Encounter (Signed)
Primary Pulmonologist: Dr Valeta Harms Last office visit and with whom: 02/18/19 Wyn Quaker What do we see them for (pulmonary problems): COPD Gold 3, pulmonary hypertension, chronic hypoxemic respiratory failure, OSA Last OV assessment/plan:   Continue Brovana twice daily as prescribed Continue Pulmicort twice daily as prescribed >>> Rinse mouth out after use  Can continue to use DuoNeb nebulized medications every 8 hours >>> We have refilled these nebulized medications and sent to [facility] Monday Wednesday Friday azithromycin  Continue 8 mg daily of prednisone until 02/22/2019 >>> On 02/22/2019 please decrease dose to 7 mg daily of prednisone >>> On 03/08/2019 please decrease dose to 6 mg daily prednisone, hold at this dose until next telephonic outreach with our office  Take the prednisone in the morning, take with food  We have refilled the prednisone    Note your daily symptoms >remember "red flags" for COPD:  >>>Increase in cough >>>increase in sputum production >>>increase in shortness of breath or activity  intolerance.   If you notice these symptoms, please call the office to be seen.    Please complete the patient assistance forms including this envelope for your sildenafil Please drop off both of your patient assistance forms when completed at our office or you can mail back with the attention to Dr. Valeta Harms  Continue oxygen therapy as prescribed  >>>maintain oxygen saturations greater than 88 percent  >>>if unable to maintain oxygen saturations please contact the office  >>>do not smoke with oxygen  >>>can use nasal saline gel or nasal saline rinses to moisturize nose if oxygen causes dryness    We recommend that you continue using your CPAP daily >>>Keep up the hard work using your device >>> Goal should be wearing this for the entire night that you are sleeping, at least 4 to 6 hours  Remember:   Do not drive or operate heavy  machinery if tired or drowsy.   Please notify the supply company and office if you are unable to use your device regularly due to missing supplies or machine being broken.   Work on maintaining a healthy weight and following your recommended nutrition plan   Maintain proper daily exercise and movement   Maintaining proper use of your device can also help improve management of other chronic illnesses such as: Blood pressure, blood sugars, and weight management.   BiPAP/ CPAP Cleaning:  >>>Clean weekly, with Dawn soap, and bottle brush.  Set up to air dry.    Return in about 6 weeks (around 04/01/2019), or if symptoms worsen or fail to improve, for Follow up with Wyn Quaker FNP-C. Was appointment offered to patient (explain)?  Yes, he wanted to get response first   Reason for call: increased SOB with exertion x3 days, cough with occasional white to yellow mucus, oxygen usually 2L during day but upped to 3L with O2 sats in the high 90's, has been using his rescue inhaler with minimal effect.  Is using his nebs such as brovana, pulmicort and duoneb.  (examples of things to ask: : When did symptoms start? Fever? Cough? Productive? Color to sputum? More sputum than usual? Wheezing? Have you needed increased oxygen? Are you taking your respiratory medications? What over the counter measures have you tried?)

## 2019-03-04 NOTE — Telephone Encounter (Signed)
Call made to patient to make aware I have sent off paper work and I would keep him updated. Voiced understanding.

## 2019-03-04 NOTE — Telephone Encounter (Signed)
Paper work was a [employer] administrator for [employer] and patient assistance for sildenafil. Both applications signed and faxed. Will hold message and await f/u.

## 2019-03-05 NOTE — Telephone Encounter (Signed)
Will keep encounter open and await an update on info from [place].

## 2019-03-09 NOTE — Telephone Encounter (Signed)
Left message with [employer] [phone] regarding patient assistance application (scanned into epic) for Spiriva Handihaler.   Left message with [employer] (P) [phone] (F) [phone] regarding Sildenafel patient assistance application.

## 2019-03-12 MED ORDER — SILDENAFIL CITRATE 20 MG PO TABS: 20.0000 mg | ORAL_TABLET | Freq: Two times a day (BID) | ORAL | 3 refills | Status: AC

## 2019-03-12 NOTE — Telephone Encounter (Signed)
OK I have it signed. Thanks.

## 2019-03-12 NOTE — Telephone Encounter (Signed)
Received paper work from [employer], they were needing a ICD dx code for paper work. Code given. They also need a script for the sildenifil faxed to their office F [phone].   Script printed and placed up stairs for app of the day to sing.   [place] pleaese route message to triage once script has been signed so that we can fax script. Thanks.

## 2019-03-12 NOTE — Telephone Encounter (Signed)
Signed prescription from [place], NP  faxed to [employer] [phone].  Confirmation received.  Nothing further at this time.  Will route message to [place] as FYI

## 2019-03-16 NOTE — Telephone Encounter (Signed)
Message routed back to [place], LPN. Per conversation, she was already working on this matter for the patient.

## 2019-03-16 NOTE — Telephone Encounter (Signed)
Call made to [employer] [phone] [id number]:YIRSWN of patient assistance for Spiriva, spoke with Nira Conn, the application is still being processed.   Call made to patient to make aware of the above. Voiced understanding. Made aware I would check back in a few days.

## 2019-03-16 NOTE — Telephone Encounter (Signed)
Gerald Stabs from [employer] called stating that med Revatio has been approved.  Gerald Stabs phone number is [phone].

## 2019-03-18 ENCOUNTER — Telehealth: Payer: Self-pay | Admitting: *Deleted

## 2019-03-18 MED ORDER — APIXABAN 5 MG PO TABS: 5.0000 mg | ORAL_TABLET | Freq: Two times a day (BID) | ORAL | 3 refills | Status: DC

## 2019-03-18 NOTE — Telephone Encounter (Signed)
Patient assistance papers faxed to [facility] for Eliquis  Confirmation received

## 2019-03-19 NOTE — Progress Notes (Signed)
Virtual Visit via Telephone Note  I connected with Andrew [name] on 03/22/19 at 11:00 AM EDT by telephone and verified that I am speaking with the correct person using two identifiers.   I discussed the limitations, risks, security and privacy concerns of performing an evaluation and management service by telephone and the availability of in person appointments. I also discussed with the patient that there may be a patient responsible charge related to this service. The patient expressed understanding and agreed to proceed.   History of Present Illness: 78 year old male former smoker followed in our office for severe COPD and chronic hypoxic respiratory failure Maintenance: Pulmicort, Brovana, scheduled duo nebs, 8 mg of prednisone daily, azithromycin Monday Wednesday Friday He is followed by Dr. Valeta Harms  Patient consented to consult via telephone: Yes  People present and their role in pt care: Pt   Chief complaint: COPD - 6 week follow up   78 year old male former smoker followed in our office for severe COPD as well as chronic hypoxic respiratory failure.  Patient is maintained on Pulmicort, Brovana, duo nebs every 8 hours, and chronic prednisone (patient has increased his prednisone dose to 10 mg daily following a taper).  Patient was also supposed to be maintained on Monday Wednesday Friday azithromycin but the patient stopped this after being treated for a COPD exacerbation last week after completing doxycycline.  Patient reports he has not taken his azithromycin for 1 week.  He reports that he will resume them today and he has 15 tablets at home.   Patient has been working with Dr. Valeta Harms to reduce his daily prednisone dose.  He continues to have concerns about reducing it.  He reports that he has symptomatic flares every time he gets below 8 mg of prednisone.  He got down to 7 mg and had an exacerbation.  Patient has concerns about reducing his printed prednisone dose further.  He wants to  remain on 8 to 10 mg of prednisone daily.  MMRC - Breathlessness Score 3 - I stop for breath after walking about 100 yards or after a few minutes on level ground (isle at grocery store is 171ft)  Observations/Objective:  Chest Imaging: Patient has had no prior axial CT imaging.  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 Valeta Harms regarding decreasing down on  your steroids. Follow-up with our office in 4 weeks  Current chronic use of systemic steroids Assessment: Patient currently taking 10 mg of prednisone daily Patient would not like to come off of prednisone, patient is willing to reduce down to 8 mg daily  Plan: Maintain 10 mg of prednisone daily I will follow-up with Dr. Valeta Harms regarding your concerns 4-week follow-up with our office for an in person assessment    Chronic  Steroid Use  . places the patient at high risk for: osteoporosis, infections, diabetes or elevated glucose levels, cataracts or glaucoma, GERD/ Peptic Ulcer Disease, Heart Failure, and or peripheral edema  We try to limit the adverse effects of systemic glucocorticoids by: Marland Kitchen Using the lowest dose of glucocorticoid to the shortest period of time to achieve treatment goals . Management of pre-existing comorbid conditions that may increase risk when glucocorticoids are required . Monitoring of patients under treatment for adverse effects who may benefit from additional interventions . If for longer than 3 months you should be started on supplemental calcium and vitamin d based on [employer] of Rheumatology (ACR) Taskforce Guidelines o Calcium 1000-1200mg  a day  o Vitamin D 600-800 IU daily  . Ensure you are up to date with vaccinations  If you are using chronic glucocorticoids/steroids please make sure: . Have regular follow-up with primary care to monitor for blood sugars . Discuss bone density testing with primary care  . Notify primary care with changes in symptoms . Have regular follow-up with ophthalmology . Actively perform weightbearing exercises  . Avoid Smoking and drinking alcohol  . 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 Valeta Harms.    I discussed the assessment and treatment plan with the patient. The patient was provided an opportunity to ask questions and all were answered. The patient agreed with the plan and demonstrated an understanding of the instructions.   The  patient was advised to call back or seek an in-person evaluation if the symptoms worsen or if the condition fails to improve as anticipated.  I provided 25 minutes of non-face-to-face time during this encounter.   [name], NP

## 2019-03-22 ENCOUNTER — Ambulatory Visit (INDEPENDENT_AMBULATORY_CARE_PROVIDER_SITE_OTHER): Payer: Medicare Other | Admitting: Pulmonary Disease

## 2019-03-22 ENCOUNTER — Other Ambulatory Visit: Payer: Self-pay

## 2019-03-22 ENCOUNTER — Encounter: Payer: Self-pay | Admitting: Pulmonary Disease

## 2019-03-22 DIAGNOSIS — Z7952 Long term (current) use of systemic steroids: Secondary | ICD-10-CM

## 2019-03-22 DIAGNOSIS — J449 Chronic obstructive pulmonary disease, unspecified: Secondary | ICD-10-CM

## 2019-03-22 DIAGNOSIS — I272 Pulmonary hypertension, unspecified: Secondary | ICD-10-CM

## 2019-03-22 DIAGNOSIS — J9611 Chronic respiratory failure with hypoxia: Secondary | ICD-10-CM | POA: Diagnosis not present

## 2019-03-22 MED ORDER — PREDNISONE 10 MG PO TABS: 10.0000 mg | ORAL_TABLET | Freq: Every day | ORAL | 0 refills | Status: DC

## 2019-03-22 MED ORDER — AZITHROMYCIN 250 MG PO TABS: 250.0000 mg | ORAL_TABLET | ORAL | 1 refills | Status: DC

## 2019-03-22 NOTE — Assessment & Plan Note (Signed)
Assessment: Likely WHO group 3  Plan: Continue sildenafil at this time

## 2019-03-22 NOTE — Assessment & Plan Note (Signed)
Plan: Continue oxygen therapy as prescribed Notify our office if your oxygen levels are below 88%

## 2019-03-22 NOTE — Patient Instructions (Addendum)
Continue Brovana twice daily as prescribed Continue Pulmicort twice daily as prescribed >>> Rinse mouth out after use  Can continue to use DuoNeb nebulized medications every 8 hours >>> We have refilled these nebulized medications and sent to [place]  Continue Monday Wednesday Friday azithromycin >>> I have refilled this medications   Continue Prednisone 10mg  daily  >>>Take the prednisone in the morning, take with food >>>We have refilled the prednisone    Note your daily symptoms >remember "red flags" for COPD:  >>>Increase in cough >>>increase in sputum production >>>increase in shortness of breath or activity  intolerance.   If you notice these symptoms, please call the office to be seen.    Please complete the patient assistance forms including this envelope for your sildenafil Please drop off both of your patient assistance forms when completed at our office or you can mail back with the attention to Dr. Valeta Harms  Continue oxygen therapy as prescribed  >>>maintain oxygen saturations greater than 88 percent  >>>if unable to maintain oxygen saturations please contact the office  >>>do not smoke with oxygen  >>>can use nasal saline gel or nasal saline rinses to moisturize nose if oxygen causes dryness    We recommend that you continue using your CPAP daily >>>Keep up the hard work using your device >>> Goal should be wearing this for the entire night that you are sleeping, at least 4 to 6 hours  Remember:   Do not drive or operate heavy machinery if tired or drowsy.   Please notify the supply company and office if you are unable to use your device regularly due to missing supplies or machine being broken.   Work on maintaining a healthy weight and following your recommended nutrition plan   Maintain proper daily exercise and movement   Maintaining proper use of your device can also help improve management of other chronic illnesses such as:  Blood pressure, blood sugars, and weight management.   BiPAP/ CPAP Cleaning:  >>>Clean weekly, with Dawn soap, and bottle brush.  Set up to air dry.   Return in about 4 weeks (around 04/19/2019), or if symptoms worsen or fail to improve, for Follow up with Wyn Quaker FNP-C, Follow up with Dr. Valeta Harms.    Coronavirus (COVID-19) Are you at risk?  Are you at risk for the Coronavirus (COVID-19)?  To be considered HIGH RISK for Coronavirus (COVID-19), you have to meet the following criteria:  . Traveled to [place], [place], [place], [place] or [place]; or in the [place] to [place], [place], [place], or [place]; and have fever, cough, and shortness of breath within the last 2 weeks of travel OR . Been in close contact with a person diagnosed with COVID-19 within the last 2 weeks and have fever, cough, and shortness of breath . IF YOU DO NOT MEET THESE CRITERIA, YOU ARE CONSIDERED LOW RISK FOR COVID-19.  What to do if you are HIGH RISK for COVID-19?  Marland Kitchen If you are having a medical emergency, call 911. . Seek medical care right away. Before you go to a doctor's office, urgent care or emergency department, call ahead and tell them about your recent travel, contact with someone diagnosed with COVID-19, and your symptoms. You should receive instructions from your physician's office regarding next steps of care.  . When you arrive at healthcare provider, tell the healthcare staff immediately you have returned from visiting [place], [place], [place], [place] or [place]; or traveled in [place]  to [place], [place], [place], or [place]; in the last two weeks or you have been in close contact with a person diagnosed with COVID-19 in the last 2 weeks.   . Tell the health care staff about your symptoms: fever, cough and shortness of breath. . After you have been seen by a medical provider, you will be either: o Tested for (COVID-19) and discharged home on quarantine except to seek  medical care if symptoms worsen, and asked to  - Stay home and avoid contact with others until you get your results (4-5 days)  - Avoid travel on public transportation if possible (such as bus, train, or airplane) or o Sent to the Emergency Department by EMS for evaluation, COVID-19 testing, and possible admission depending on your condition and test results.  What to do if you are LOW RISK for COVID-19?  Reduce your risk of any infection by using the same precautions used for avoiding the common cold or flu:  Marland Kitchen Wash your hands often with soap and warm water for at least 20 seconds.  If soap and water are not readily available, use an alcohol-based hand sanitizer with at least 60% alcohol.  . If coughing or sneezing, cover your mouth and nose by coughing or sneezing into the elbow areas of your shirt or coat, into a tissue or into your sleeve (not your hands). . Avoid shaking hands with others and consider head nods or verbal greetings only. . Avoid touching your eyes, nose, or mouth with unwashed hands.  . Avoid close contact with people who are sick. . Avoid places or events with large numbers of people in one location, like concerts or sporting events. . Carefully consider travel plans you have or are making. . If you are planning any travel outside or inside the [place], visit the CDC's Travelers' Health webpage for the latest health notices. . If you have some symptoms but not all symptoms, continue to monitor at home and seek medical attention if your symptoms worsen. . If you are having a medical emergency, call 911.   [place] / e-Visit: [email or web address]         [facility]: [facility]: [phone]                   [facility]: [phone]           It is flu season:   >>> Best ways to protect herself from the flu:  Receive the yearly flu vaccine, practice good hand hygiene washing with soap and also using hand sanitizer when available, eat a nutritious meals, get adequate rest, hydrate appropriately   Please contact the office if your symptoms worsen or you have concerns that you are not improving.   Thank you for choosing [facility] for your healthcare, and for allowing [place] to partner with you on your healthcare journey. I am thankful to be able to provide care to you today.   Wyn Quaker FNP-C       Chronic Obstructive Pulmonary Disease Chronic obstructive pulmonary disease (COPD) is a long-term (chronic) lung problem. When you have COPD, it is hard for air to get in and out of your lungs. Usually the condition gets worse over time, and your lungs will never return to normal. There are things you can do to keep yourself as healthy as possible.  Your doctor may treat your condition  with: ? Medicines. ? Oxygen. ? Lung surgery.  Your doctor may also recommend: ? Rehabilitation. This includes steps to make your body work better. It may involve a team of specialists. ? Quitting smoking, if you smoke. ? Exercise and changes to your diet. ? Comfort measures (palliative care). Follow these instructions at home: Medicines  Take over-the-counter and prescription medicines only as told by your doctor.  Talk to your doctor before taking any cough or allergy medicines. You may need to avoid medicines that cause your lungs to be dry. Lifestyle  If you smoke, stop. Smoking makes the problem worse. If you need help quitting, ask your doctor.  Avoid being around things that make your breathing worse. This may include smoke, chemicals, and fumes.  Stay active, but remember to rest as well.  Learn and use tips on how to relax.  Make sure you get enough sleep. Most adults need at least 7 hours of sleep every night.  Eat healthy foods. Eat smaller meals more often. Rest before  meals. Controlled breathing Learn and use tips on how to control your breathing as told by your doctor. Try:  Breathing in (inhaling) through your nose for 1 second. Then, pucker your lips and breath out (exhale) through your lips for 2 seconds.  Putting one hand on your belly (abdomen). Breathe in slowly through your nose for 1 second. Your hand on your belly should move out. Pucker your lips and breathe out slowly through your lips. Your hand on your belly should move in as you breathe out.  Controlled coughing Learn and use controlled coughing to clear mucus from your lungs. Follow these steps: 1. Lean your head a little forward. 2. Breathe in deeply. 3. Try to hold your breath for 3 seconds. 4. Keep your mouth slightly open while coughing 2 times. 5. Spit any mucus out into a tissue. 6. Rest and do the steps again 1 or 2 times as needed. General instructions  Make sure you get all the shots (vaccines) that your doctor recommends. Ask your doctor about a flu shot and a pneumonia shot.  Use oxygen therapy and pulmonary rehabilitation if told by your doctor. If you need home oxygen therapy, ask your doctor if you should buy a tool to measure your oxygen level (oximeter).  Make a COPD action plan with your doctor. This helps you to know what to do if you feel worse than usual.  Manage any other conditions you have as told by your doctor.  Avoid going outside when it is very hot, cold, or humid.  Avoid people who have a sickness you can catch (contagious).  Keep all follow-up visits as told by your doctor. This is important. Contact a doctor if:  You cough up more mucus than usual.  There is a change in the color or thickness of the mucus.  It is harder to breathe than usual.  Your breathing is faster than usual.  You have trouble sleeping.  You need to use your medicines more often than usual.  You have trouble doing your normal activities such as getting dressed or  walking around the house. Get help right away if:  You have shortness of breath while resting.  You have shortness of breath that stops you from: ? Being able to talk. ? Doing normal activities.  Your chest hurts for longer than 5 minutes.  Your skin color is more blue than usual.  Your pulse oximeter shows that you have low oxygen for  longer than 5 minutes.  You have a fever.  You feel too tired to breathe normally. Summary  Chronic obstructive pulmonary disease (COPD) is a long-term lung problem.  The way your lungs work will never return to normal. Usually the condition gets worse over time. There are things you can do to keep yourself as healthy as possible.  Take over-the-counter and prescription medicines only as told by your doctor.  If you smoke, stop. Smoking makes the problem worse. This information is not intended to replace advice given to you by your health care provider. Make sure you discuss any questions you have with your health care provider. Document Released: 04/22/2008 Document Revised: 12/09/2016 Document Reviewed: 12/09/2016 Elsevier Interactive Patient Education  2019 [employer].

## 2019-03-22 NOTE — Assessment & Plan Note (Signed)
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 Valeta Harms regarding decreasing down on your steroids. Follow-up with our office in 4 weeks

## 2019-03-22 NOTE — Assessment & Plan Note (Addendum)
Assessment: Patient currently taking 10 mg of prednisone daily Patient would not like to come off of prednisone, patient is willing to reduce down to 8 mg daily  Plan: Maintain 10 mg of prednisone daily I will follow-up with Dr. Valeta Harms regarding your concerns 4-week follow-up with our office for an in person assessment    Chronic Steroid Use  . places the patient at high risk for: osteoporosis, infections, diabetes or elevated glucose levels, cataracts or glaucoma, GERD/ Peptic Ulcer Disease, Heart Failure, and or peripheral edema  We try to limit the adverse effects of systemic glucocorticoids by: Marland Kitchen Using the lowest dose of glucocorticoid to the shortest period of time to achieve treatment goals . Management of pre-existing comorbid conditions that may increase risk when glucocorticoids are required . Monitoring of patients under treatment for adverse effects who may benefit from additional interventions . If for longer than 3 months you should be started on supplemental calcium and vitamin d based on [employer] of Rheumatology (ACR) Taskforce Guidelines o Calcium 1000-1200mg  a day  o Vitamin D 600-800 IU daily  . Ensure you are up to date with vaccinations  If you are using chronic glucocorticoids/steroids please make sure: . Have regular follow-up with primary care to monitor for blood sugars . Discuss bone density testing with primary care  . Notify primary care with changes in symptoms . Have regular follow-up with ophthalmology . Actively perform weightbearing exercises  . Avoid Smoking and drinking alcohol  . Take measures to prevent falls

## 2019-03-23 NOTE — Telephone Encounter (Signed)
Called and spoke with Desenity with [employer] [phone] on the patient assistance for Spiriva. They were in need of RX from pharmacy and hardship letter from patient All paperwork has been rec'd by patient and our office at this time All documents have been sent over to manufacture for process for determination.  Determination will take 7-10 business days from today's date.  Called patient to make aware of above info He verbalized understanding Will f/u in 7 business days with update.

## 2019-03-24 NOTE — Progress Notes (Signed)
PCCM: agree. Thanks for calling Garner Nash, DO [facility] 03/24/2019 9:10 AM

## 2019-04-01 NOTE — Telephone Encounter (Signed)
Call made to patient, he states all paper work has been taken care and he should be receiving his medication in the mail within 5-7 business days. Nothing further is needed at this time.

## 2019-04-07 NOTE — Telephone Encounter (Signed)
Spoke with patient, he has been approved and received his first shipment

## 2019-04-18 NOTE — Progress Notes (Signed)
@Patient  ID: Andrew [name], male    DOB: Nov 21, 1940, 78 y.o.   MRN: [id number]  Chief Complaint  Patient presents with  . Follow-up    feeling 'back to normal' - no complaints today. 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  He feels well managed on daily prednisone as well as Monday Wednesday Friday azithromycin.  He continues to be adherent to Pulmicort, Brovana and Spiriva HandiHaler.  We have discussed at length over his past couple of office visits the concerns of remaining on chronic prednisone.  Patient is okay with risk-benefit analysis as he believes the chronic prednisone gives him a higher quality of life.  He would like to remain on the 10 mg daily.  Dr. Valeta Harms and I both agree that this is okay if this is something the patient is comfortable doing.  Patient reports that he has not had any increased oxygen needs.  MMRC - Breathlessness Score 2 - on level ground, I walk slower than people of the same age because of breathlessness, or have to stop for breathe when walking to my own pace    Tests:   12/14/2018-CT chest without contrast- advanced emphysema, patchy mucoid impaction of bronchi, arthrosclerosis  01/07/2019-pulmonary function test- FVC 2.14 (59% predicted), postbronchodilator ratio 38, postbronchodilator FEV1 0.80 (31% predicted), no bronchodilator response, DLCO 24  09/10/2017-echocardiogram-LV ejection fraction 55 to 60%, PA peak pressure 33  01/21/2019- ECG:  QTC 421  04/19/2019-EKG-  sinus rhythm, rate 75, QTc 422  FENO:  No results found for: NITRICOXIDE  PFT: PFT Results Latest Ref Rng & Units 01/07/2019  FVC-Pre L 2.14  FVC-Predicted Pre % 59  FVC-Post L 2.09  FVC-Predicted Post % 58  Pre FEV1/FVC % % 40  Post FEV1/FCV % % 38  FEV1-Pre L 0.87  FEV1-Predicted Pre % 34  FEV1-Post L 0.80  DLCO UNC% % 24  DLCO COR %Predicted % 44  TLC L 8.03  TLC % Predicted % 124  RV % Predicted % 223    Imaging: No results found.    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 Influenza, High Dose Seasonal PF 07/22/2016, 08/18/2017, 08/17/2018  . Influenza-Unspecified 09/19/2015  . PPD Test 02/15/2016  . Pneumococcal Conjugate-13 09/19/2015  . Pneumococcal Polysaccharide-23 12/20/2016    Past Medical History:  Diagnosis Date  . Anemia   . BiPAP (biphasic positive airway pressure) dependence  Pt denies history of OSA  . BPH (benign prostatic hyperplasia)   . Cancer ([place])    skin  . COPD (chronic obstructive pulmonary disease) ([place])   . Dysphagia   . Emphysema lung ([place])   . GERD (gastroesophageal reflux disease)    " Silent reflux"  . Hearing loss    right ear  . History of hiatal hernia   . HLD (hyperlipidemia)   . Hypertension   . Oxygen dependent    2-3 liters  . Oxygen dependent   .  Pneumonia   . Pulmonary hypertension ([place])   . Sleep apnea    wears cpap    Tobacco History: Social History   Tobacco Use  Smoking Status Former Smoker  . Packs/day: 0.00  . Years: 0.00  . Pack years: 0.00  . Types: Cigarettes  . Last attempt to quit: 10/01/1984  . Years since quitting: 34.5  Smokeless Tobacco Never Used   Counseling given: Yes   Continue to not smoke  Outpatient Encounter Medications as of 04/19/2019  Medication Sig  . albuterol (PROVENTIL HFA;VENTOLIN HFA) 108 (90 Base) MCG/ACT inhaler Inhale 2 puffs into the lungs every 6 (six) hours as needed for wheezing or shortness of breath.  Marland Kitchen apixaban (ELIQUIS) 5 MG TABS tablet Take 1 tablet (5 mg total) by mouth 2 (two) times daily.  Marland Kitchen arformoterol (BROVANA) 15 MCG/2ML NEBU Take 2 mLs (15 mcg total) by nebulization 2 (two) times daily.  Marland Kitchen azithromycin (ZITHROMAX) 250 MG tablet Take 1 tablet (250 mg total) by mouth every Monday, Wednesday, and Friday.  . budesonide (PULMICORT) 0.5 MG/2ML nebulizer solution Take 2 mLs (0.5 mg total) by nebulization 2 (two) times daily.  Marland Kitchen diltiazem (CARDIZEM CD) 180 MG 24 hr capsule TAKE 1 CAPSULE EVERY DAY (Patient taking differently: Take 180 mg by mouth daily. )  . feeding supplement, ENSURE ENLIVE, (ENSURE ENLIVE) LIQD Take 237 mLs by mouth 2 (two) times daily between meals.  . finasteride (PROSCAR) 5 MG tablet Take 5 mg by mouth at bedtime.   . fluticasone (FLONASE) 50 MCG/ACT nasal spray Place 1 spray into both nostrils at bedtime.   Marland Kitchen guaiFENesin (MUCINEX) 600 MG 12 hr tablet Take 1 tablet (600 mg total) by mouth 2 (two) times daily.  . hydrocortisone cream 1 % Apply 1 application topically daily as needed (Eczema on face and head).  Marland Kitchen ipratropium-albuterol (DUONEB) 0.5-2.5 (3) MG/3ML SOLN Take 3 mLs by nebulization daily as needed. J44.1  . Miconazole Nitrate (CRITIC-AID CLEAR AF) 2 % OINT Apply 1 application topically at bedtime.  . Multiple Vitamins-Minerals (MULTIVITAMIN WITH  MINERALS) tablet Take 1 tablet by mouth daily.  Marland Kitchen omeprazole (PRILOSEC) 40 MG capsule Take 40 mg by mouth 2 (two) times daily.   . OXYGEN Inhale 2-3 L into the lungs See admin instructions. 2L daytime, 3L at night   . polyethylene glycol (MIRALAX / GLYCOLAX) packet Take 17 g by mouth daily. (Patient taking differently: Take 17 g by mouth at bedtime. Mix in 8 oz liquid and drink)  . predniSONE (DELTASONE) 10 MG tablet Take 1 tablet (10 mg total) by mouth daily with breakfast.  . rosuvastatin (CRESTOR) 10 MG tablet Take 10 mg by mouth daily.  . sildenafil (REVATIO) 20 MG tablet Take 1 tablet (20 mg total) by mouth 2 (two) times daily.  Marland Kitchen Spacer/Aero-Holding Chambers (AEROCHAMBER MV) inhaler Use as instructed  . SPIRIVA HANDIHALER 18 MCG inhalation capsule INHALE THE CONTENTS OF 1 CAPSULE EVERY DAY  . tamsulosin (FLOMAX)  0.4 MG CAPS capsule Take 1 capsule (0.4 mg total) by mouth daily after breakfast.  . temazepam (RESTORIL) 30 MG capsule Take 1 capsule (30 mg total) by mouth at bedtime.  . vitamin C (ASCORBIC ACID) 500 MG tablet Take 500 mg by mouth 2 (two) times daily.   Marland Kitchen VITAMIN D PO Take 1 tablet by mouth daily.  . [DISCONTINUED] azithromycin (ZITHROMAX) 250 MG tablet Take 1 tablet (250 mg total) by mouth every Monday, Wednesday, and Friday.  . [DISCONTINUED] azithromycin (ZITHROMAX) 250 MG tablet Take 1 tablet (250 mg total) by mouth every Monday, Wednesday, and Friday.  . [DISCONTINUED] doxycycline (VIBRA-TABS) 100 MG tablet Take 1 tablet (100 mg total) by mouth 2 (two) times daily.  . [DISCONTINUED] ipratropium-albuterol (DUONEB) 0.5-2.5 (3) MG/3ML SOLN Take 3 mLs by nebulization 3 (three) times daily as needed. J44.1  . [DISCONTINUED] predniSONE (DELTASONE) 1 MG tablet Take 3 tablets (3 mg total) by mouth daily with breakfast.  . [DISCONTINUED] predniSONE (DELTASONE) 10 MG tablet Take 4 tabs for 2 days, then 3 tabs for 2 days, 2 tabs for 2 days, then 1 tab for 2 days, then stop.  .  [DISCONTINUED] predniSONE (DELTASONE) 10 MG tablet Take 1 tablet (10 mg total) by mouth daily with breakfast.  . [DISCONTINUED] predniSONE (DELTASONE) 5 MG tablet Take 1 tablet (5 mg total) by mouth daily with breakfast.  . nystatin (MYCOSTATIN) 100000 UNIT/ML suspension Take 6 mLs by mouth 4 (four) times daily.   No facility-administered encounter medications on file as of 04/19/2019.      Review of Systems  Review of Systems  Constitutional: Positive for fatigue. Negative for activity change, chills, fever and unexpected weight change.  HENT: Negative for postnasal drip, rhinorrhea, sinus pressure, sinus pain, sneezing and sore throat.   Eyes: Negative.   Respiratory: Positive for shortness of breath. Negative for cough and wheezing.   Cardiovascular: Negative for chest pain and palpitations.  Gastrointestinal: Negative for diarrhea, nausea and vomiting.  Endocrine: Negative.   Musculoskeletal: Negative.   Skin: Negative.   Neurological: Negative for dizziness and headaches.  Psychiatric/Behavioral: Negative.  Negative for dysphoric mood. The patient is not nervous/anxious.   All other systems reviewed and are negative.    Physical Exam  BP 118/72 (BP Location: Right Arm, Patient Position: Sitting, Cuff Size: Normal)   Pulse 84   Temp 97.8 F (36.6 C)   Ht 5\' 7"  (1.702 m)   Wt 135 lb 3.2 oz (61.3 kg)   SpO2 96%   BMI 21.18 kg/m   Wt Readings from Last 5 Encounters:  04/19/19 135 lb 3.2 oz (61.3 kg)  01/26/19 133 lb (60.3 kg)  01/21/19 129 lb 9.6 oz (58.8 kg)  01/07/19 129 lb (58.5 kg)  12/27/18 127 lb 3.3 oz (57.7 kg)     Physical Exam  Constitutional: He is oriented to person, place, and time and well-developed, well-nourished, and in no distress. No distress.  Chronically ill adult male  HENT:  Head: Normocephalic and atraumatic.  Right Ear: Hearing, tympanic membrane, external ear and ear canal normal.  Left Ear: Hearing, tympanic membrane, external ear and ear  canal normal.  Nose: Nose normal. Right sinus exhibits no maxillary sinus tenderness and no frontal sinus tenderness. Left sinus exhibits no maxillary sinus tenderness and no frontal sinus tenderness.  Mouth/Throat: Uvula is midline and oropharynx is clear and moist. No oropharyngeal exudate.  Eyes: Pupils are equal, round, and reactive to light.  Neck: Normal range of motion. Neck supple.  Cardiovascular: Normal rate, regular rhythm and normal heart sounds.  Pulmonary/Chest: Effort normal and breath sounds normal. No accessory muscle usage. No respiratory distress. He has no decreased breath sounds. He has no wheezes. He has no rhonchi.  Abdominal: Soft. Bowel sounds are normal. There is abdominal tenderness (Patient reports chronic tenderness).  Musculoskeletal: Normal range of motion.        General: No edema.  Lymphadenopathy:    He has no cervical adenopathy.  Neurological: He is alert and oriented to person, place, and time. Gait normal.  Skin: Skin is warm and dry. He is not diaphoretic. No erythema.  Psychiatric: Mood, memory, affect and judgment normal.  Nursing note and vitals reviewed.     Lab Results:  CBC    Component Value Date/Time   WBC 8.4 12/30/2018 0902   RBC 4.32 12/30/2018 0902   HGB 14.4 12/30/2018 0902   HCT 43.0 12/30/2018 0902   PLT 151 12/30/2018 0902   MCV 99.5 12/30/2018 0902   MCH 33.3 12/30/2018 0902   MCHC 33.5 12/30/2018 0902   RDW 12.5 12/30/2018 0902   LYMPHSABS 0.7 12/30/2018 0902   MONOABS 0.4 12/30/2018 0902   EOSABS 0.0 12/30/2018 0902   BASOSABS 0.0 12/30/2018 0902    BMET    Component Value Date/Time   NA 138 12/30/2018 0902   K 3.7 12/30/2018 0902   CL 97 (L) 12/30/2018 0902   CO2 30 12/30/2018 0902   GLUCOSE 168 (H) 12/30/2018 0902   BUN 27 (H) 12/30/2018 0902   CREATININE 1.08 12/30/2018 0902   CALCIUM 8.8 (L) 12/30/2018 0902   GFRNONAA >60 12/30/2018 0902   GFRAA >60 12/30/2018 0902    BNP    Component Value Date/Time    BNP 51.4 12/27/2018 0104    ProBNP    Component Value Date/Time   PROBNP 23.0 09/28/2018 1242      Assessment & Plan:   Stage 3 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 ([place]) 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 CT chest shows advanced emphysema Oxygen dependent Prone to exacerbations February/2020 pulmonary function test shows COPD Gold 3, FEV1 0.8, DLCO 24 Maintained on Brovana, Pulmicort, Spiriva HandiHaler, prednisone 10 mg, Monday Wednesday Friday azithromycin mMRC 2 today Lung sounds clear to auscultation  Plan: Continue Brovana, Pulmicort, Spiriva HandiHaler, prednisone 10 mg daily, Monday Wednesday Friday azithromycin Okay to use duo nebs as needed Follow-up with our office in 3 months Actively work increasing physical activity Continue oxygen therapy as prescribed Patient has a 7-day course of doxycycline at home to use if he feels he is having a COPD exacerbation, patient to contact our office if he has to start using this antibiotic.  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  Plan: Maintain 10 mg of prednisone daily Follow-up with our office in 3 months  Chronic Steroid Use  . places the patient at high risk for: osteoporosis, infections, diabetes or elevated glucose levels, cataracts  or glaucoma, GERD/ Peptic Ulcer Disease, Heart Failure,  and or peripheral edema  We try to limit the adverse effects of systemic glucocorticoids by: Marland Kitchen Using the lowest dose of glucocorticoid to the shortest period of time to achieve treatment goals . Management of pre-existing comorbid conditions that may increase risk when glucocorticoids are required . Monitoring of patients under treatment for adverse effects who may benefit from additional interventions . If for longer than 3 months you should be started on supplemental calcium and vitamin d based on [employer] of Rheumatology (ACR) Taskforce Guidelines o Calcium 1000-1200mg  a day  o Vitamin D 600-800 IU daily  . Ensure you are up to date with vaccinations  If you are using chronic glucocorticoids/steroids please make sure: . Have regular follow-up with primary care to monitor for blood sugars . Discuss bone density testing with primary care  . Notify primary care with changes in symptoms . Have regular follow-up with ophthalmology . Actively perform weightbearing exercises  . Avoid Smoking and drinking alcohol  . Take measures to prevent falls       Return in about 3 months (around 07/20/2019), or if symptoms worsen or fail to improve, for Follow up with Dr. Valeta Harms, Follow up with Wyn Quaker FNP-C.   [name], NP 04/19/2019   This appointment was 32 minutes long with over 50% of the time in direct face-to-face patient care, assessment, plan of care, and follow-up.

## 2019-04-19 ENCOUNTER — Ambulatory Visit (INDEPENDENT_AMBULATORY_CARE_PROVIDER_SITE_OTHER): Payer: Medicare Other | Admitting: Pulmonary Disease

## 2019-04-19 ENCOUNTER — Other Ambulatory Visit: Payer: Self-pay

## 2019-04-19 ENCOUNTER — Encounter: Payer: Self-pay | Admitting: Pulmonary Disease

## 2019-04-19 VITALS — BP 118/72 | HR 84 | Temp 97.8°F | Ht 67.0 in | Wt 135.2 lb

## 2019-04-19 DIAGNOSIS — Z7952 Long term (current) use of systemic steroids: Secondary | ICD-10-CM

## 2019-04-19 DIAGNOSIS — Z5181 Encounter for therapeutic drug level monitoring: Secondary | ICD-10-CM

## 2019-04-19 DIAGNOSIS — Z9989 Dependence on other enabling machines and devices: Secondary | ICD-10-CM

## 2019-04-19 DIAGNOSIS — J449 Chronic obstructive pulmonary disease, unspecified: Secondary | ICD-10-CM

## 2019-04-19 DIAGNOSIS — G4733 Obstructive sleep apnea (adult) (pediatric): Secondary | ICD-10-CM | POA: Diagnosis not present

## 2019-04-19 DIAGNOSIS — I272 Pulmonary hypertension, unspecified: Secondary | ICD-10-CM

## 2019-04-19 DIAGNOSIS — J9611 Chronic respiratory failure with hypoxia: Secondary | ICD-10-CM | POA: Diagnosis not present

## 2019-04-19 MED ORDER — AZITHROMYCIN 250 MG PO TABS: 250.0000 mg | ORAL_TABLET | ORAL | 1 refills | Status: DC

## 2019-04-19 MED ORDER — IPRATROPIUM-ALBUTEROL 0.5-2.5 (3) MG/3ML IN SOLN: 3.0000 mL | Freq: Every day | RESPIRATORY_TRACT | 3 refills | Status: DC | PRN

## 2019-04-19 MED ORDER — PREDNISONE 10 MG PO TABS: 10.0000 mg | ORAL_TABLET | Freq: Every day | ORAL | 6 refills | Status: DC

## 2019-04-19 MED ORDER — AZITHROMYCIN 250 MG PO TABS: 250.0000 mg | ORAL_TABLET | ORAL | 6 refills | Status: DC

## 2019-04-19 NOTE — Assessment & Plan Note (Signed)
Assessment: Patient currently taking 10 mg of prednisone daily Patient does not want to come off of prednisone Patient knows the risks and we have reviewed this multiple times of chronic steroid use.  Plan: Maintain 10 mg of prednisone daily Follow-up with our office in 3 months  Chronic Steroid Use  . places the patient at high risk for: osteoporosis, infections, diabetes or elevated glucose levels, cataracts or glaucoma, GERD/ Peptic Ulcer Disease, Heart Failure, and or peripheral edema  We try to limit the adverse effects of systemic glucocorticoids by: Marland Kitchen Using the lowest dose of glucocorticoid to the shortest period of time to achieve treatment goals . Management of pre-existing comorbid conditions that may increase risk when glucocorticoids are required . Monitoring of patients under treatment for adverse effects who may benefit from additional interventions . If for longer than 3 months you should be started on supplemental calcium and vitamin d based on [employer] (ACR) Taskforce Guidelines o Calcium 1000-1200mg  a day  o Vitamin D 600-800 IU daily  . Ensure you are up to date with vaccinations  If you are using chronic glucocorticoids/steroids please make sure: . Have regular follow-up with primary care to monitor for blood sugars . Discuss bone density testing with primary care  . Notify primary care with changes in symptoms . Have regular follow-up with ophthalmology . Actively perform weightbearing exercises  . Avoid Smoking and drinking alcohol  . Take measures to prevent falls

## 2019-04-19 NOTE — Assessment & Plan Note (Signed)
Plan: Continue oxygen therapy as prescribed Notify our office if oxygen levels are below 88%

## 2019-04-19 NOTE — Assessment & Plan Note (Signed)
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

## 2019-04-19 NOTE — Patient Instructions (Addendum)
EKG today   Continue Brovanatwice daily as prescribed Continue Pulmicort twice daily as prescribed >>>Rinse mouth out after use  Continue Spiriva Handihaler daily  >>>rinse mouth after use   Can continue to use DuoNeb nebulized medications as needed  >>>We have refilled these nebulized medications and sent to [facility]  Continue Monday Wednesday Friday azithromycin >>> I have refilled this medications   Continue Prednisone 10mg  daily  >>>Take the prednisone in the morning, take with food >>>We have refilled the prednisone   Note your daily symptoms >remember "red flags" for COPD:  >>>Increase in cough >>>increase in sputum production >>>increase in shortness of breath or activity intolerance.   If you notice these symptoms, please call the office to be seen.  If you notice that you are having worsened symptoms such as productive cough with discolored mucus or increased shortness of breath and you start the doxycycline antibiotic that you have at home please take the doxycycline antibiotic as prescribed.  Do not take azithromycin while you are taking your doxycycline.  Continue oxygen therapy as prescribed  >>>maintain oxygen saturations greater than 88 percent  >>>if unable to maintain oxygen saturations please contact the office  >>>do not smoke with oxygen  >>>can use nasal saline gel or nasal saline rinses to moisturize nose if oxygen causes dryness    We recommend that you continue using your CPAP daily >>>Keep up the hard work using your device >>> Goal should be wearing this for the entire night that you are sleeping, at least 4 to 6 hours  Remember:   Do not drive or operate heavy machinery if tired or drowsy.   Please notify the supply company and office if you are unable to use your device regularly due to missing supplies or machine being broken.   Work on maintaining a healthy weight and following your recommended nutrition plan    Maintain proper daily exercise and movement   Maintaining proper use of your device can also help improve management of other chronic illnesses such as: Blood pressure, blood sugars, and weight management.   BiPAP/ CPAP Cleaning:  >>>Clean weekly, with Dawn soap, and bottle brush. Set up to air dry.   Return in about 3 months (around 07/20/2019), or if symptoms worsen or fail to improve, for Follow up with Dr. Valeta Harms, Follow up with Wyn Quaker FNP-C.    Coronavirus (COVID-19) Are you at risk?  Are you at risk for the Coronavirus (COVID-19)?  To be considered HIGH RISK for Coronavirus (COVID-19), you have to meet the following criteria:  . Traveled to [place], [place], [place], [place] or [place]; or in the [place] to [place], [place], [place], or [place]; and have fever, cough, and shortness of breath within the last 2 weeks of travel OR . Been in close contact with a person diagnosed with COVID-19 within the last 2 weeks and have fever, cough, and shortness of breath . IF YOU DO NOT MEET THESE CRITERIA, YOU ARE CONSIDERED LOW RISK FOR COVID-19.  What to do if you are HIGH RISK for COVID-19?  Marland Kitchen If you are having a medical emergency, call 911. . Seek medical care right away. Before you go to a doctor's office, urgent care or emergency department, call ahead and tell them about your recent travel, contact with someone diagnosed with COVID-19, and your symptoms. You should receive instructions from your physician's office regarding next steps of care.  . When you arrive at healthcare provider, tell the healthcare  staff immediately you have returned from visiting [place], [place], [place], [place] or [place]; or traveled in the [place] to [place], [place], [place], or [place]; in the last two weeks or you have been in close contact with a person diagnosed with COVID-19 in the last 2 weeks.   . Tell the health care staff about your symptoms: fever, cough and  shortness of breath. . After you have been seen by a medical provider, you will be either: o Tested for (COVID-19) and discharged home on quarantine except to seek medical care if symptoms worsen, and asked to  - Stay home and avoid contact with others until you get your results (4-5 days)  - Avoid travel on public transportation if possible (such as bus, train, or airplane) or o Sent to the Emergency Department by EMS for evaluation, COVID-19 testing, and possible admission depending on your condition and test results.  What to do if you are LOW RISK for COVID-19?  Reduce your risk of any infection by using the same precautions used for avoiding the common cold or flu:  Marland Kitchen Wash your hands often with soap and warm water for at least 20 seconds.  If soap and water are not readily available, use an alcohol-based hand sanitizer with at least 60% alcohol.  . If coughing or sneezing, cover your mouth and nose by coughing or sneezing into the elbow areas of your shirt or coat, into a tissue or into your sleeve (not your hands). . Avoid shaking hands with others and consider head nods or verbal greetings only. . Avoid touching your eyes, nose, or mouth with unwashed hands.  . Avoid close contact with people who are sick. . Avoid places or events with large numbers of people in one location, like concerts or sporting events. . Carefully consider travel plans you have or are making. . If you are planning any travel outside or inside the [place], visit the CDC's Travelers' Health webpage for the latest health notices. . If you have some symptoms but not all symptoms, continue to monitor at home and seek medical attention if your symptoms worsen. . If you are having a medical emergency, call 911.   [place] / e-Visit: [email or web address]         [facility]: [facility]:  [phone]                   [facility]: [phone]           It is flu season:   >>> Best ways to protect herself from the flu: Receive the yearly flu vaccine, practice good hand hygiene washing with soap and also using hand sanitizer when available, eat a nutritious meals, get adequate rest, hydrate appropriately   Please contact the office if your symptoms worsen or you have concerns that you are not improving.   Thank you for choosing [facility] for your healthcare, and for allowing [place] to partner with you on your healthcare journey. I am thankful to be able to provide care to you today.   Wyn Quaker FNP-C

## 2019-04-19 NOTE — Assessment & Plan Note (Addendum)
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 CT chest shows advanced emphysema Oxygen dependent Prone to exacerbations February/2020 pulmonary function test shows COPD Gold 3, FEV1 0.8, DLCO 24 Maintained on Brovana, Pulmicort, Spiriva HandiHaler, prednisone 10 mg, Monday Wednesday Friday azithromycin mMRC 2 today Lung sounds clear to auscultation  Plan: Continue Brovana, Pulmicort, Spiriva HandiHaler, prednisone 10 mg daily, Monday Wednesday Friday azithromycin Okay to use duo nebs as needed Follow-up with our office in 3 months Actively work increasing physical activity Continue oxygen therapy as prescribed Patient has a 7-day course of doxycycline at home to use if he feels he is having a COPD exacerbation, patient to contact our office if he has to start using this antibiotic.  If he has to start doxycycline that he will stop Monday Wednesday Friday azithromycin for the time being EKG today stable

## 2019-04-19 NOTE — Assessment & Plan Note (Signed)
Plan: Continue CPAP therapy as prescribed 

## 2019-04-19 NOTE — Progress Notes (Signed)
Discussed results with patient in office.  Nothing further is needed at this time.  Brian Mack FNP  

## 2019-04-20 ENCOUNTER — Telehealth: Payer: Self-pay | Admitting: Pulmonary Disease

## 2019-04-20 DIAGNOSIS — J449 Chronic obstructive pulmonary disease, unspecified: Secondary | ICD-10-CM

## 2019-04-20 MED ORDER — IPRATROPIUM-ALBUTEROL 0.5-2.5 (3) MG/3ML IN SOLN: RESPIRATORY_TRACT | 3 refills | Status: DC

## 2019-04-20 MED ORDER — REVEFENACIN 175 MCG/3ML IN SOLN: 3.0000 mL | Freq: Every day | RESPIRATORY_TRACT | 6 refills | Status: DC

## 2019-04-20 NOTE — Telephone Encounter (Signed)
Called and spoke with Andrew [name] from [place] and stated the info to him from [place]. Andrew [name] stated to me again that he had spoken with Andrew [name] and others from [place] to verify that they were sure that pt's medicare part B would be at no cost for pt if switched to Yupelri from the spiriva handihaler and Andrew [name] was told more than once that it would be at no cost to pt. Andrew [name] also stated that with pt being in the donut hole, this would not affect anything with that as it would be at no cost for pt.  Andrew [name] stated for [place] to place Rx through Epic and pt could have a shipment of Yupelri by tomorrow or the following day.  Called and spoke with pt stating to him the info I found out from [place] with [facility] in regards to if we switched him from [place] to [place] and that the [place] would be at no cost to him and pt stated he was all for switching to [place] and stop using the Spiriva Handihaler as it would help out with cost of meds for him. Stated to pt that I would remove the Spiriva Handihaler off of his medication list and we would send Rx for Yupelri to [facility] and they would take care of getting shipment sent to him. Pt expressed understanding. Maretta Bees Rx has been sent to [place] has been removed off of pt's med list.  Routing to [place] as an FYI that this has been taken care of for pt.

## 2019-04-20 NOTE — Telephone Encounter (Signed)
Yes.  This is great news.  Thank you for helping with the patient.  Wyn Quaker, FNP

## 2019-04-20 NOTE — Progress Notes (Signed)
PCCM: Agree. Thanks for seeing  At some point we should address [place] MOST form. Garner Nash, DO [facility] 04/20/2019 3:30 PM

## 2019-04-20 NOTE — Telephone Encounter (Signed)
If Caryl Pina can guarantee that you have the Maretta Bees will be at no cost to the patient that I do believe that this is a reasonable plan.  Triage can you please contact the patient and see if he is willing to use the Yupelri nebulized daily medication in place of his Spiriva HandiHaler.  I discussed this with him yesterday but we had concerns regarding how this would affect overall cost as patient is already in the donut hole.  If actually can confirm that it will be a $0 co-pay to the patient and no out-of-pocket cost for him I do believe that the Yupelri nebulized medication will be a much better option than the Spiriva HandiHaler that he is currently taking.  Okay to change DuoNeb order to how actually is stating it needs to be ordered.  Wyn Quaker, FNP

## 2019-04-20 NOTE — Telephone Encounter (Signed)
Patient Instructions by [name], NP at 04/19/2019 10:30 AM  Author: [name], NP Author Type: Nurse Practitioner Filed: 04/19/2019 11:02 AM  Note Status: Addendum Cosign: Cosign Not Required Encounter Date: 04/19/2019  Editor: [name], NP (Nurse Practitioner)  Prior Versions: 1. [name], NP (Nurse Practitioner) at 04/19/2019 11:01 AM - Addendum   2. [name], NP (Nurse Practitioner) at 04/19/2019 10:59 AM - Addendum   3. [name], NP (Nurse Practitioner) at 04/19/2019 10:52 AM - Addendum   4. [name], NP (Nurse Practitioner) at 04/19/2019 10:42 AM - Signed    EKG today   Continue Brovanatwice daily as prescribed Continue Pulmicort twice daily as prescribed >>>Rinse mouth out after use  Continue Spiriva Handihaler daily  >>>rinse mouth after use   Can continue to use DuoNeb nebulized medications as needed  >>>We have refilled these nebulized medications and sent to [place] Monday Wednesday Friday azithromycin >>> I have refilled this medications  Continue Prednisone 10mg  daily  >>>Take the prednisone in the morning, take with food >>>We have refilled the prednisone   Note your daily symptoms >remember "red flags" for COPD:  >>>Increase in cough >>>increase in sputum production >>>increase in shortness of breath or activity intolerance.   If you notice these symptoms, please call the office to be seen.  If you notice that you are having worsened symptoms such as productive cough with discolored mucus or increased shortness of breath and you start the doxycycline antibiotic that you have at home please take the doxycycline antibiotic as prescribed.  Do not take azithromycin while you are taking your doxycycline.  Continue oxygen therapy as prescribed  >>>maintain oxygen saturations greater than 88 percent  >>>if unable to maintain oxygen saturations please contact the office  >>>do not smoke with oxygen  >>>can  use nasal saline gel or nasal saline rinses to moisturize nose if oxygen causes dryness    We recommend that you continue using your CPAP daily >>>Keep up the hard work using your device >>> Goal should be wearing this for the entire night that you are sleeping, at least 4 to 6 hours  Remember:   Do not drive or operate heavy machinery if tired or drowsy.   Please notify the supply company and office if you are unable to use your device regularly due to missing supplies or machine being broken.   Work on maintaining a healthy weight and following your recommended nutrition plan   Maintain proper daily exercise and movement   Maintaining proper use of your device can also help improve management of other chronic illnesses such as: Blood pressure, blood sugars, and weight management.   BiPAP/ CPAP Cleaning:  >>>Clean weekly, with Dawn soap, and bottle brush. Set up to air dry.   Return in about 3 months (around 07/20/2019), or if symptoms worsen or fail to improve, for Follow up with Dr. Valeta Harms, Follow up with Wyn Quaker FNP-C.     Called and spoke with Caryl Pina from [place] in regards to pt's neb sol and per Caryl Pina, pt's duoneb Rx needs to specify daily and prn per medicare guidelines that way they can make sure that pt will have enough med and will not run out. I have changed the instructions to where the duoneb specifies daily and prn as stated by Caryl Pina.  While speaking with Caryl Pina, he wanted to know if Aaron Edelman might want to switch pt from the spiriva handihaler to [place] and  for pt to take that neb tx once daily as Caryl Pina stated that this will be at no cost to pt if pt was switched to [place] and receiving that neb med through [place] as well.  Aaron Edelman, please advise on this if you want to keep pt on the Spiriva Handihaler or switch pt to [place] as suggested by [place]. Thanks!

## 2019-05-04 ENCOUNTER — Other Ambulatory Visit: Payer: Self-pay

## 2019-05-04 ENCOUNTER — Telehealth: Payer: Self-pay | Admitting: Pulmonary Disease

## 2019-05-04 DIAGNOSIS — J449 Chronic obstructive pulmonary disease, unspecified: Secondary | ICD-10-CM

## 2019-05-04 DIAGNOSIS — J9611 Chronic respiratory failure with hypoxia: Secondary | ICD-10-CM

## 2019-05-04 MED ORDER — AZITHROMYCIN 250 MG PO TABS: 250.0000 mg | ORAL_TABLET | ORAL | 6 refills | Status: DC

## 2019-05-04 NOTE — Telephone Encounter (Signed)
There is no documentation of a previous call. Returned call to patient. No answer lmtcb.

## 2019-05-05 NOTE — Telephone Encounter (Signed)
Pt states he needs refill on azithromycin sent to [facility]. Made pt aware rx refill submitted today. Nothing further needed.

## 2019-05-05 NOTE — Telephone Encounter (Signed)
Pt returning call

## 2019-05-05 NOTE — Telephone Encounter (Signed)
ATC Patient. Left Patient message to call back if needed.

## 2019-05-05 NOTE — Telephone Encounter (Signed)
Patient is returning phone call.  Patient phone number is [phone].

## 2019-05-05 NOTE — Telephone Encounter (Signed)
LMTCB x2 for pt 

## 2019-05-06 ENCOUNTER — Telehealth: Payer: Self-pay | Admitting: Pulmonary Disease

## 2019-05-06 NOTE — Telephone Encounter (Signed)
Patient dropped off medical release form - 2 pages to [place] staff.  Form is for [employer] and due by 05/12/19.  Patient requests forms be faxed to number provided [phone].  Form placed in Dr. Fabio Bering mailbox and routed to [place] for follow up.

## 2019-05-06 NOTE — Telephone Encounter (Signed)
LMTCB. Please inform patient forms have been faxed. Copy made and sent to scan. A copy will be kept with [facility] paperwork once uploaded into epic will shred.

## 2019-05-07 NOTE — Telephone Encounter (Signed)
Spoke with pt and advised message from [place]. Pt understood and thanked [place]. Nothing further is needed.

## 2019-06-15 ENCOUNTER — Telehealth: Payer: Self-pay | Admitting: Adult Health

## 2019-06-15 MED ORDER — BUDESONIDE 0.5 MG/2ML IN SUSP: 0.5000 mg | Freq: Two times a day (BID) | RESPIRATORY_TRACT | 5 refills | Status: DC

## 2019-06-15 MED ORDER — ARFORMOTEROL TARTRATE 15 MCG/2ML IN NEBU: 15.0000 ug | INHALATION_SOLUTION | Freq: Two times a day (BID) | RESPIRATORY_TRACT | 6 refills | Status: DC

## 2019-06-15 NOTE — Telephone Encounter (Signed)
Refilled both the budesonide and brovana neb sol. Looked at the [facility] and saw that we last filled this 6/2 and added 6 additional refills on the rx. Called and spoke with pt stating to him that we did refill the budesonide and brovana and stated to him to check with [facility] pharmacy to see if they can refill the yupelri based on the rx that was sent 6/2 and pt verbalized understanding. Nothing further needed.

## 2019-06-25 ENCOUNTER — Other Ambulatory Visit: Payer: Self-pay | Admitting: Physician Assistant

## 2019-06-28 DIAGNOSIS — N3941 Urge incontinence: Secondary | ICD-10-CM | POA: Diagnosis not present

## 2019-06-28 DIAGNOSIS — R3915 Urgency of urination: Secondary | ICD-10-CM | POA: Diagnosis not present

## 2019-07-02 ENCOUNTER — Telehealth: Payer: Self-pay | Admitting: Pulmonary Disease

## 2019-07-02 ENCOUNTER — Other Ambulatory Visit: Payer: Self-pay

## 2019-07-02 DIAGNOSIS — Z20822 Contact with and (suspected) exposure to covid-19: Secondary | ICD-10-CM

## 2019-07-02 DIAGNOSIS — R0600 Dyspnea, unspecified: Secondary | ICD-10-CM

## 2019-07-02 DIAGNOSIS — R6889 Other general symptoms and signs: Secondary | ICD-10-CM | POA: Diagnosis not present

## 2019-07-02 MED ORDER — PREDNISONE 10 MG PO TABS: ORAL_TABLET | ORAL | 0 refills | Status: DC

## 2019-07-02 NOTE — Telephone Encounter (Signed)
Message below routed to app of the day, Wyn Quaker, NP:  Primary Pulmonologist: [facility] Last office visit and with whom: 04/19/19 - Mack What do we see them for (pulmonary problems): Stage 3 COPD Gold, Chronic Hypoxemic Respiratory Failure  Reason for call: SOB  In the last month, have you been in contact with someone who was confirmed or suspected to have [place]avirus / COVID-19?  No  Do you have any of the following symptoms developed in the last 30 days? Fever: No  Cough: Has cough because of COPD Shortness of breath: yes When did your symptoms start? 07/02/19  Patient had a SOB episode about a month ago and was told to call if it happened again. Patient currently takes Prednisone 10 mg daily and Azithromycin 3 times a week. He states he noticed getting SOB yesterday morning and it lasted all day. Having SOB just getting out of bed, walking to the bathroom and anywhere in the home. And would have to sit down for 5 - 10 minutes before he was able to get back to normal.   Patient confirmed he is using the [place], Pulmicort and Yupelri as directed. He does have a cough that is occasionally productive. When it is productive the phlegm is clear. Patient denied wheezing, nasal and chest congestion.  Patient was asked all the symptom questions for COVID and he only answered yes to increased SOB (reason for call), cough and weakness. The weakness was due to his SOB and the need to sit down to catch his breath. Patient stated he wanted to call to get ahead of this before the weekend.  Aaron Edelman, based on message above, please advise if he needs to be seen today. Thank you.

## 2019-07-02 NOTE — Telephone Encounter (Signed)
Sorry to hear the patient is not feeling well.  These would be my recommendations:  Prednisone 10mg  tablet  >>>4 tabs for 2 days, then 3 tabs for 2 days, 2 tabs for 2 days, then resume 1 tab daily >>>take with food  >>>take in the morning   Please place order  Can continue azithromycin Monday Wednesday Friday at this time, if sputum color changes please contact our office and we can start a different antibiotic.  Please place the order for [phone] for COVID testing.  Place order as normal and lab collect.  Appointments are not required and please instruct the patient to present to 1 of these testing sites listed below whichever is closest and most convenient for the patient.  COVID Testing Site Locations (For sick patients only, pre-procedure is done differently)  . [facility] [address], [place], [place] [address] . [place]  (on [employer])  . [place] [address], [place] (across from [facility] Emergency Department)    Keep scheduled appointment with myself in September, if patient needs to move appointment up then we can consider this as well.  I would like for the patient to have a negative COVID test to help rule out other causes of shortness of breath at this time.  If SARS-CoV-2 is negative and prednisone is not helping likely will need to get patient into the office for additional blood work and chest x-ray.  If symptoms worsen patient will need to present to the emergency room for further evaluation  Wyn Quaker FNP

## 2019-07-02 NOTE — Telephone Encounter (Signed)
Called and spoke with pt letting him know the info stated by Aaron Edelman. Stated to him that we were going to send in a short pred taper for him to take and then after this, he was to resume taking the 1 tab daily as currently doing. Pt verbalized understanding. Also stated to pt that we were needing him to get covid tested as precaution due to his symptoms and pt expressed understanding. Gave pt address for covid testing site and stated to him that he did not need an appt. Pt verbalized understanding. Order placed for covid test. Nothing further needed.

## 2019-07-04 LAB — NOVEL CORONAVIRUS, NAA: SARS-CoV-2, NAA: NOT DETECTED

## 2019-07-04 NOTE — Progress Notes (Signed)
Negative covid19 test. No new recs.   Andrew [name]

## 2019-07-05 NOTE — Progress Notes (Signed)
SARS-CoV-2 negative.  Continue forward with plan of care.  No further changes at this time.  Wyn Quaker, FNP

## 2019-07-07 DIAGNOSIS — E440 Moderate protein-calorie malnutrition: Secondary | ICD-10-CM | POA: Diagnosis not present

## 2019-07-07 DIAGNOSIS — I482 Chronic atrial fibrillation, unspecified: Secondary | ICD-10-CM | POA: Diagnosis not present

## 2019-07-07 DIAGNOSIS — Z79899 Other long term (current) drug therapy: Secondary | ICD-10-CM | POA: Diagnosis not present

## 2019-07-07 DIAGNOSIS — I272 Pulmonary hypertension, unspecified: Secondary | ICD-10-CM | POA: Diagnosis not present

## 2019-07-07 DIAGNOSIS — J439 Emphysema, unspecified: Secondary | ICD-10-CM | POA: Diagnosis not present

## 2019-07-19 NOTE — Progress Notes (Signed)
@Patient  ID: Andrew [name], male    DOB: 23-May-1941, 78 y.o.   MRN: [id number]  Chief Complaint  Patient presents with  . Follow-up    F/U for COPD. Finished a prednisone taper 2 weeks after a flare. Is feeling much better.     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 Valeta Harms  07/20/2019  - Visit   78 year old male former smoker followed in our office for severe COPD as well as chronic respiratory failure.  Patient is maintained on triple therapy nebulized meds: Pulmicort, Garlon Hatchet, Yupelri.  Patient also takes 10 mg of prednisone daily.  Patient also continues to take azithromycin Monday Wednesday Friday.  Overall patient feels that his breathing is returning back to his baseline after being treated telephonically with a prednisone taper a few weeks ago.  Patient also was confirmed be COVID negative.  Patient feels that he is at his baseline today.  Patient prefers nebulized maintenance meds at this time.  He reports that he receives these from [facility] at $0 cost to him.  Patient maintained on CPAP therapy at home for management of obstructive sleep apnea.  CPAP compliance report today shows excellent compliance.  See compliance report listed below:  06/19/2019-07/18/2019 20-30 had a last 30 days use, 29 of those days greater than 4 hours, average usage 6 hours and 19 minutes, CPAP set pressure 7, AHI 0.4   mMRC Dyspnea Scale mMRC Score  07/20/2019 3    Tests:   12/14/2018-CT chest without contrast- advanced emphysema, patchy mucoid impaction of bronchi, arthrosclerosis  01/07/2019-pulmonary function  test- FVC 2.14 (59% predicted), postbronchodilator ratio 38, postbronchodilator FEV1 0.80 (31% predicted), no bronchodilator response, DLCO 24  09/10/2017-echocardiogram-LV ejection fraction 55 to 60%, PA peak pressure 33  01/21/2019- ECG:  QTC 421  04/19/2019-EKG- sinus rhythm, rate 75, QTc 422  FENO:  No results found for: NITRICOXIDE  PFT: PFT Results Latest Ref Rng & Units 01/07/2019  FVC-Pre L 2.14  FVC-Predicted Pre % 59  FVC-Post L 2.09  FVC-Predicted Post % 58  Pre FEV1/FVC % % 40  Post FEV1/FCV % % 38  FEV1-Pre L 0.87  FEV1-Predicted Pre % 34  FEV1-Post L 0.80  DLCO UNC% % 24  DLCO COR %Predicted % 44  TLC L 8.03  TLC % Predicted % 124  RV % Predicted % 223    Imaging: No results found.    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 Fluad Quad(high Dose 65+) 07/20/2019  . Influenza, High Dose Seasonal PF 07/22/2016, 08/18/2017, 08/17/2018  .  Influenza-Unspecified 09/19/2015  . PPD Test 02/15/2016  . Pneumococcal Conjugate-13 09/19/2015  . Pneumococcal Polysaccharide-23 12/20/2016   High Dose Flu vaccine   Past Medical History:  Diagnosis Date  . Anemia   . BiPAP (biphasic positive airway pressure) dependence    Pt denies history of OSA  . BPH (benign prostatic hyperplasia)   . Cancer ([place])     skin  . COPD (chronic obstructive pulmonary disease) ([place])   . Dysphagia   . Emphysema lung ([place])   . GERD (gastroesophageal reflux disease)    " Silent reflux"  . Hearing loss    right ear  . History of hiatal hernia   . HLD (hyperlipidemia)   . Hypertension   . Oxygen dependent    2-3 liters  . Oxygen dependent   . Pneumonia   . Pulmonary hypertension ([place])   . Sleep apnea    wears cpap    Tobacco History: Social History   Tobacco Use  Smoking Status Former Smoker  . Packs/day: 0.00  . Years: 0.00  . Pack years: 0.00  . Types: Cigarettes  . Quit date: 10/01/1984  . Years since quitting: 34.8  Smokeless Tobacco Never Used   Counseling given: Not Answered   Continue to not smoke  Outpatient Encounter Medications as of 07/20/2019  Medication Sig  . albuterol (PROVENTIL HFA;VENTOLIN HFA) 108 (90 Base) MCG/ACT inhaler Inhale 2 puffs into the lungs every 6 (six) hours as needed for wheezing or shortness of breath.  Marland Kitchen apixaban (ELIQUIS) 5 MG TABS tablet Take 1 tablet (5 mg total) by mouth 2 (two) times daily.  Marland Kitchen arformoterol (BROVANA) 15 MCG/2ML NEBU Take 2 mLs (15 mcg total) by nebulization 2 (two) times daily.  Marland Kitchen azithromycin (ZITHROMAX) 250 MG tablet Take 1 tablet (250 mg total) by mouth every Monday, Wednesday, and Friday.  . budesonide (PULMICORT) 0.5 MG/2ML nebulizer solution Take 2 mLs (0.5 mg total) by nebulization 2 (two) times daily.  Marland Kitchen diltiazem (CARDIZEM CD) 180 MG 24 hr capsule TAKE 1 CAPSULE EVERY DAY  . feeding supplement, ENSURE ENLIVE, (ENSURE ENLIVE) LIQD Take 237 mLs by mouth 2 (two) times daily between meals.  . finasteride (PROSCAR) 5 MG tablet Take 5 mg by mouth at bedtime.   . fluticasone (FLONASE) 50 MCG/ACT nasal spray Place 1 spray into both nostrils at bedtime.   Marland Kitchen guaiFENesin (MUCINEX) 600 MG 12 hr tablet Take 1 tablet (600 mg total) by mouth 2 (two) times daily.  . hydrocortisone cream 1 % Apply 1 application topically daily as needed (Eczema on  face and head).  Marland Kitchen ipratropium-albuterol (DUONEB) 0.5-2.5 (3) MG/3ML SOLN Take 33mls by nebulization daily and prn  . Miconazole Nitrate (CRITIC-AID CLEAR AF) 2 % OINT Apply 1 application topically at bedtime.  . Multiple Vitamins-Minerals (MULTIVITAMIN WITH MINERALS) tablet Take 1 tablet by mouth daily.  Marland Kitchen omeprazole (PRILOSEC) 40 MG capsule Take 40 mg by mouth 2 (two) times daily.   . OXYGEN Inhale 2-3 L into the lungs See admin instructions. 2L daytime, 3L at night   . polyethylene glycol (MIRALAX / GLYCOLAX) packet Take 17 g by mouth daily. (Patient taking differently: Take 17 g by mouth at bedtime. Mix in 8 oz liquid and drink)  . predniSONE (DELTASONE) 10 MG tablet Take 1 tablet (10 mg total) by mouth daily with breakfast.  . Revefenacin (YUPELRI) 175 MCG/3ML SOLN Inhale 3 mLs into the lungs daily.  . rosuvastatin (CRESTOR) 10 MG tablet Take 10 mg by mouth daily.  Marland Kitchen  sildenafil (REVATIO) 20 MG tablet Take 1 tablet (20 mg total) by mouth 2 (two) times daily.  Marland Kitchen Spacer/Aero-Holding Chambers (AEROCHAMBER MV) inhaler Use as instructed  . tamsulosin (FLOMAX) 0.4 MG CAPS capsule Take 1 capsule (0.4 mg total) by mouth daily after breakfast.  . temazepam (RESTORIL) 30 MG capsule Take 1 capsule (30 mg total) by mouth at bedtime.  . vitamin C (ASCORBIC ACID) 500 MG tablet Take 500 mg by mouth 2 (two) times daily.   Marland Kitchen VITAMIN D PO Take 1 tablet by mouth daily.  . [DISCONTINUED] nystatin (MYCOSTATIN) 100000 UNIT/ML suspension Take 6 mLs by mouth 4 (four) times daily.  . [DISCONTINUED] predniSONE (DELTASONE) 10 MG tablet Take 4tabsx2days, 3tabsx2days, 2tabsx2days, then resume taking 1tab daily   No facility-administered encounter medications on file as of 07/20/2019.      Review of Systems  Review of Systems  Constitutional: Positive for fatigue. Negative for activity change, chills, fever and unexpected weight change.  HENT: Negative for congestion, postnasal drip, rhinorrhea, sinus pressure, sinus  pain and sore throat.   Eyes: Negative.   Respiratory: Positive for shortness of breath and wheezing. Negative for cough.   Cardiovascular: Negative for chest pain, palpitations and leg swelling.  Gastrointestinal: Negative for constipation, diarrhea, nausea and vomiting.  Endocrine: Negative.   Genitourinary: Negative.   Musculoskeletal: Negative.   Skin: Negative.   Neurological: Negative for dizziness and headaches.  Psychiatric/Behavioral: Negative.  Negative for dysphoric mood. The patient is not nervous/anxious.   All other systems reviewed and are negative.    Physical Exam  BP 106/62 (BP Location: Left Arm, Patient Position: Sitting, Cuff Size: Normal)   Pulse 64   Temp (!) 97.5 F (36.4 C) (Oral)   Ht 5\' 7"  (1.702 m)   Wt 140 lb 9.6 oz (63.8 kg)   SpO2 98%   BMI 22.02 kg/m   Wt Readings from Last 5 Encounters:  07/20/19 140 lb 9.6 oz (63.8 kg)  04/19/19 135 lb 3.2 oz (61.3 kg)  01/26/19 133 lb (60.3 kg)  01/21/19 129 lb 9.6 oz (58.8 kg)  01/07/19 129 lb (58.5 kg)    Physical Exam Vitals signs and nursing note reviewed.  Constitutional:      General: He is not in acute distress.    Appearance: Normal appearance. He is normal weight.     Comments: Chronically ill elderly male  HENT:     Head: Normocephalic and atraumatic.     Right Ear: Hearing, tympanic membrane, ear canal and external ear normal.     Left Ear: Hearing, tympanic membrane, ear canal and external ear normal.     Nose: Nose normal. No mucosal edema, congestion or rhinorrhea.     Right Turbinates: Not enlarged.     Left Turbinates: Not enlarged.     Mouth/Throat:     Mouth: Mucous membranes are dry.     Pharynx: Oropharynx is clear. No oropharyngeal exudate.  Eyes:     Pupils: Pupils are equal, round, and reactive to light.  Neck:     Musculoskeletal: Normal range of motion.  Cardiovascular:     Rate and Rhythm: Normal rate and regular rhythm.     Pulses: Normal pulses.     Heart sounds:  Normal heart sounds. No murmur.  Pulmonary:     Effort: Pulmonary effort is normal. No respiratory distress.     Breath sounds: No decreased breath sounds, wheezing or rales.     Comments: Diminished breath sounds throughout exam Musculoskeletal:  Right lower leg: No edema.     Left lower leg: No edema.  Lymphadenopathy:     Cervical: No cervical adenopathy.  Skin:    General: Skin is warm and dry.     Capillary Refill: Capillary refill takes less than 2 seconds.     Findings: No erythema or rash.  Neurological:     General: No focal deficit present.     Mental Status: He is alert and oriented to person, place, and time.     Motor: No weakness.     Coordination: Coordination normal.     Gait: Gait is intact. Gait normal.  Psychiatric:        Mood and Affect: Mood normal.        Behavior: Behavior normal. Behavior is cooperative.        Thought Content: Thought content normal.        Judgment: Judgment normal.      Lab Results:  CBC    Component Value Date/Time   WBC 8.4 12/30/2018 0902   RBC 4.32 12/30/2018 0902   HGB 14.4 12/30/2018 0902   HCT 43.0 12/30/2018 0902   PLT 151 12/30/2018 0902   MCV 99.5 12/30/2018 0902   MCH 33.3 12/30/2018 0902   MCHC 33.5 12/30/2018 0902   RDW 12.5 12/30/2018 0902   LYMPHSABS 0.7 12/30/2018 0902   MONOABS 0.4 12/30/2018 0902   EOSABS 0.0 12/30/2018 0902   BASOSABS 0.0 12/30/2018 0902    BMET    Component Value Date/Time   NA 138 12/30/2018 0902   K 3.7 12/30/2018 0902   CL 97 (L) 12/30/2018 0902   CO2 30 12/30/2018 0902   GLUCOSE 168 (H) 12/30/2018 0902   BUN 27 (H) 12/30/2018 0902   CREATININE 1.08 12/30/2018 0902   CALCIUM 8.8 (L) 12/30/2018 0902   GFRNONAA >60 12/30/2018 0902   GFRAA >60 12/30/2018 0902    BNP    Component Value Date/Time   BNP 51.4 12/27/2018 0104    ProBNP    Component Value Date/Time   PROBNP 23.0 09/28/2018 1242      Assessment & Plan:   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  Plan: Continue sildenafil Continue CPAP therapy  Chronic hypoxemic respiratory failure (HCC) Plan: Continue oxygen therapy as prescribed  Stage 3 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 ([place]) 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 CT chest shows advanced emphysema Oxygen dependent Prone to exacerbations February/2020 pulmonary function test shows COPD Gold 3, FEV1 0.8, DLCO 24 Maintained on Brovana, Pulmicort, Yupelri, prednisone 10 mg, Monday Wednesday Friday azithromycin mMRC 3 today Lung sounds clear to auscultation  Plan: Continue Brovana, Pulmicort, Yupelri, prednisone 10 mg daily, Monday Wednesday Friday azithromycin Okay to use duo nebs as needed Follow-up with our office in 3 months with EKG Actively work increasing physical activity Continue oxygen therapy as prescribed  flu vaccine today  OSA on CPAP Plan: Continue CPAP therapy  Current chronic use of systemic steroids Discussion: We have weighed the pros and cons and discussed this at length with the patient.  Patient prefers to be maintained on chronic systemic steroids.  He knows the risk of being maintained on systemic steroids.  He agrees to continue to use systemic steroids despite that risk.  Plan: Continue 10 mg of prednisone daily  Healthcare maintenance Plan: Flu vaccine today    Return in about 3 months (around 10/19/2019), or if symptoms worsen or fail to improve, for Follow up with Dr. Valeta Harms, Follow up with  Wyn Quaker FNP-C.   [name], NP 07/20/2019   This appointment was 28 minutes long with over 50% of the time in direct face-to-face patient care, assessment, plan of care, and follow-up.

## 2019-07-20 ENCOUNTER — Other Ambulatory Visit: Payer: Self-pay

## 2019-07-20 ENCOUNTER — Ambulatory Visit (INDEPENDENT_AMBULATORY_CARE_PROVIDER_SITE_OTHER): Payer: Medicare Other | Admitting: Pulmonary Disease

## 2019-07-20 ENCOUNTER — Encounter: Payer: Self-pay | Admitting: Pulmonary Disease

## 2019-07-20 VITALS — BP 106/62 | HR 64 | Temp 97.5°F | Ht 67.0 in | Wt 140.6 lb

## 2019-07-20 DIAGNOSIS — G4733 Obstructive sleep apnea (adult) (pediatric): Secondary | ICD-10-CM

## 2019-07-20 DIAGNOSIS — J9611 Chronic respiratory failure with hypoxia: Secondary | ICD-10-CM | POA: Diagnosis not present

## 2019-07-20 DIAGNOSIS — I272 Pulmonary hypertension, unspecified: Secondary | ICD-10-CM

## 2019-07-20 DIAGNOSIS — Z9989 Dependence on other enabling machines and devices: Secondary | ICD-10-CM

## 2019-07-20 DIAGNOSIS — Z23 Encounter for immunization: Secondary | ICD-10-CM | POA: Diagnosis not present

## 2019-07-20 DIAGNOSIS — J449 Chronic obstructive pulmonary disease, unspecified: Secondary | ICD-10-CM

## 2019-07-20 DIAGNOSIS — Z7952 Long term (current) use of systemic steroids: Secondary | ICD-10-CM

## 2019-07-20 DIAGNOSIS — Z0000 Encounter for general adult medical examination without abnormal findings: Secondary | ICD-10-CM | POA: Insufficient documentation

## 2019-07-20 NOTE — Assessment & Plan Note (Signed)
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 CT chest shows advanced emphysema Oxygen dependent Prone to exacerbations February/2020 pulmonary function test shows COPD Gold 3, FEV1 0.8, DLCO 24 Maintained on Brovana, Pulmicort, Yupelri, prednisone 10 mg, Monday Wednesday Friday azithromycin mMRC 3 today Lung sounds clear to auscultation  Plan: Continue Brovana, Pulmicort, Yupelri, prednisone 10 mg daily, Monday Wednesday Friday azithromycin Okay to use duo nebs as needed Follow-up with our office in 3 months with EKG Actively work increasing physical activity Continue oxygen therapy as prescribed  flu vaccine today

## 2019-07-20 NOTE — Assessment & Plan Note (Signed)
Plan: Continue CPAP therapy 

## 2019-07-20 NOTE — Patient Instructions (Addendum)
You were seen today by [name], NP  for:   1. Chronic hypoxemic respiratory failure (HCC)  Continue oxygen therapy as prescribed  >>>maintain oxygen saturations greater than 88 percent  >>>if unable to maintain oxygen saturations please contact the office  >>>do not smoke with oxygen  >>>can use nasal saline gel or nasal saline rinses to moisturize nose if oxygen causes dryness   2. OSA on CPAP  We recommend that you continue using your CPAP daily >>>Keep up the hard work using your device >>> Goal should be wearing this for the entire night that you are sleeping, at least 4 to 6 hours  Remember:  . Do not drive or operate heavy machinery if tired or drowsy.  . Please notify the supply company and office if you are unable to use your device regularly due to missing supplies or machine being broken.  . Work on maintaining a healthy weight and following your recommended nutrition plan  . Maintain proper daily exercise and movement  . Maintaining proper use of your device can also help improve management of other chronic illnesses such as: Blood pressure, blood sugars, and weight management.   BiPAP/ CPAP Cleaning:  >>>Clean weekly, with Dawn soap, and bottle brush.  Set up to air dry.  3. Pulmonary hypertension (HCC)  Continue Revatio  4. Current chronic use of systemic steroids  Continue Prednisone 10mg  daily  >>>Take the prednisone in the morning, take with food >>>We have refilled the prednisone  5. Stage 3 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 ([place])  Continue Brovanatwice daily as prescribed Continue Pulmicort twice daily as prescribed >>>Rinse mouth out after use  Continue Yupelri as prescribed   Can continue to use DuoNeb nebulized medications as needed  >>>We have refilled these nebulized medications and sent to [facility]  Continue Monday Wednesday Friday azithromycin >>> I have refilled this medications  Continue Prednisone 10mg  daily   >>>Take the prednisone in the morning, take with food >>>We have refilled the prednisone  Note your daily symptoms >remember "red flags" for COPD:  >>>Increase in cough >>>increase in sputum production >>>increase in shortness of breath or activity intolerance.   High dose flu vaccine today   Follow Up:    Return in about 3 months (around 10/19/2019), or if symptoms worsen or fail to improve, for Follow up with Dr. Valeta Harms, Follow up with Wyn Quaker FNP-C.   Please do your part to reduce the spread of COVID-19:      Reduce your risk of any infection  and COVID19 by using the similar precautions used for avoiding the common cold or flu:  Marland Kitchen Wash your hands often with soap and warm water for at least 20 seconds.  If soap and water are not readily available, use an alcohol-based hand sanitizer with at least 60% alcohol.  . If coughing or sneezing, cover your mouth and nose by coughing or sneezing into the elbow areas of your shirt or coat, into a tissue or into your sleeve (not your hands). Langley Gauss A MASK when in public  . Avoid shaking hands with others and consider head nods or verbal greetings only. . Avoid touching your eyes, nose, or mouth with unwashed hands.  . Avoid close contact with people who are sick. . Avoid places or events with large numbers of people in one location, like concerts or sporting events. . If you have some symptoms but not all symptoms, continue to monitor at home and seek medical attention if  your symptoms worsen. . If you are having a medical emergency, call 911.   [place] / e-Visit: [email or web address]         [facility]: [facility]: [id number]                   [facility]: [id number]     It is flu season:   >>> Best ways to protect herself from the flu: Receive the yearly flu  vaccine, practice good hand hygiene washing with soap and also using hand sanitizer when available, eat a nutritious meals, get adequate rest, hydrate appropriately   Please contact the office if your symptoms worsen or you have concerns that you are not improving.   Thank you for choosing [facility] for your healthcare, and for allowing [place] to partner with you on your healthcare journey. I am thankful to be able to provide care to you today.   Wyn Quaker FNP-C   Influenza Virus Vaccine injection What is this medicine? INFLUENZA VIRUS VACCINE (in floo EN zuh VAHY ruhs vak SEEN) helps to reduce the risk of getting influenza also known as the flu. The vaccine only helps protect you against some strains of the flu. This medicine may be used for other purposes; ask your health care provider or pharmacist if you have questions. COMMON BRAND NAME(S): Afluria, Afluria Quadrivalent, Agriflu, Alfuria, FLUAD, Fluarix, Fluarix Quadrivalent, Flublok, Flublok Quadrivalent, FLUCELVAX, Flulaval, Fluvirin, Fluzone, Fluzone High-Dose, Fluzone Intradermal What should I tell my health care provider before I take this medicine? They need to know if you have any of these conditions:  bleeding disorder like hemophilia  fever or infection  Guillain-Barre syndrome or other neurological problems  immune system problems  infection with the human immunodeficiency virus (HIV) or AIDS  low blood platelet counts  multiple sclerosis  an unusual or allergic reaction to influenza virus vaccine, latex, other medicines, foods, dyes, or preservatives. Different brands of vaccines contain different allergens. Some may contain latex or eggs. Talk to your doctor about your allergies to make sure that you get the right vaccine.  pregnant or trying to get pregnant  breast-feeding How should I use this medicine? This vaccine is for injection into a muscle or under the skin. It is given by a health care  professional. A copy of Vaccine Information Statements will be given before each vaccination. Read this sheet carefully each time. The sheet may change frequently. Talk to your healthcare provider to see which vaccines are right for you. Some vaccines should not be used in all age groups. Overdosage: If you think you have taken too much of this medicine contact a poison control center or emergency room at once. NOTE: This medicine is only for you. Do not share this medicine with others. What if I miss a dose? This does not apply. What may interact with this medicine?  chemotherapy or radiation therapy  medicines that lower your immune system like etanercept, anakinra, infliximab, and adalimumab  medicines that treat or prevent blood clots like warfarin  phenytoin  steroid medicines like prednisone or cortisone  theophylline  vaccines This list may not describe all possible interactions. Give your health care provider a list of all the medicines, herbs, non-prescription drugs, or dietary supplements you use. Also tell them if you smoke, drink alcohol, or use illegal drugs. Some items may interact with your medicine. What should I watch for while  using this medicine? Report any side effects that do not go away within 3 days to your doctor or health care professional. Call your health care provider if any unusual symptoms occur within 6 weeks of receiving this vaccine. You may still catch the flu, but the illness is not usually as bad. You cannot get the flu from the vaccine. The vaccine will not protect against colds or other illnesses that may cause fever. The vaccine is needed every year. What side effects may I notice from receiving this medicine? Side effects that you should report to your doctor or health care professional as soon as possible:  allergic reactions like skin rash, itching or hives, swelling of the face, lips, or tongue Side effects that usually do not require medical  attention (report to your doctor or health care professional if they continue or are bothersome):  fever  headache  muscle aches and pains  pain, tenderness, redness, or swelling at the injection site  tiredness This list may not describe all possible side effects. Call your doctor for medical advice about side effects. You may report side effects to FDA at 1-800-FDA-1088. Where should I keep my medicine? The vaccine will be given by a health care professional in a clinic, pharmacy, doctor's office, or other health care setting. You will not be given vaccine doses to store at home. NOTE: This sheet is a summary. It may not cover all possible information. If you have questions about this medicine, talk to your doctor, pharmacist, or health care provider.  2020 Elsevier/Gold Standard (2018-09-29 08:45:43)

## 2019-07-20 NOTE — Assessment & Plan Note (Signed)
Discussion: We have weighed the pros and cons and discussed this at length with the patient.  Patient prefers to be maintained on chronic systemic steroids.  He knows the risk of being maintained on systemic steroids.  He agrees to continue to use systemic steroids despite that risk.  Plan: Continue 10 mg of prednisone daily

## 2019-07-20 NOTE — Assessment & Plan Note (Signed)
Plan: Continue oxygen therapy as prescribed 

## 2019-07-20 NOTE — Assessment & Plan Note (Signed)
Likely WHO group 3  Plan: Continue sildenafil Continue CPAP therapy  

## 2019-07-20 NOTE — Assessment & Plan Note (Signed)
Plan: °Flu vaccine today °

## 2019-07-22 NOTE — Progress Notes (Signed)
PCCM: Doristine Devoid thanks for seeing him. At some point goals of care discussions and advanced care planning should be discussed again. He is on maximal medical therapy.  Garner Nash, DO [facility] 07/22/2019 7:55 AM

## 2019-08-24 ENCOUNTER — Telehealth: Payer: Self-pay | Admitting: *Deleted

## 2019-08-24 NOTE — Telephone Encounter (Signed)
A message was left, re: follow up visit. 

## 2019-08-26 DIAGNOSIS — L821 Other seborrheic keratosis: Secondary | ICD-10-CM | POA: Diagnosis not present

## 2019-08-26 DIAGNOSIS — Z872 Personal history of diseases of the skin and subcutaneous tissue: Secondary | ICD-10-CM | POA: Diagnosis not present

## 2019-08-26 DIAGNOSIS — L300 Nummular dermatitis: Secondary | ICD-10-CM | POA: Diagnosis not present

## 2019-08-26 DIAGNOSIS — L814 Other melanin hyperpigmentation: Secondary | ICD-10-CM | POA: Diagnosis not present

## 2019-08-26 DIAGNOSIS — C44612 Basal cell carcinoma of skin of right upper limb, including shoulder: Secondary | ICD-10-CM | POA: Diagnosis not present

## 2019-08-26 DIAGNOSIS — D485 Neoplasm of uncertain behavior of skin: Secondary | ICD-10-CM | POA: Diagnosis not present

## 2019-08-26 DIAGNOSIS — Z85820 Personal history of malignant melanoma of skin: Secondary | ICD-10-CM | POA: Diagnosis not present

## 2019-08-26 DIAGNOSIS — C44519 Basal cell carcinoma of skin of other part of trunk: Secondary | ICD-10-CM | POA: Diagnosis not present

## 2019-08-26 DIAGNOSIS — L218 Other seborrheic dermatitis: Secondary | ICD-10-CM | POA: Diagnosis not present

## 2019-08-26 DIAGNOSIS — D225 Melanocytic nevi of trunk: Secondary | ICD-10-CM | POA: Diagnosis not present

## 2019-08-26 DIAGNOSIS — D1801 Hemangioma of skin and subcutaneous tissue: Secondary | ICD-10-CM | POA: Diagnosis not present

## 2019-08-26 DIAGNOSIS — C4441 Basal cell carcinoma of skin of scalp and neck: Secondary | ICD-10-CM | POA: Diagnosis not present

## 2019-08-26 DIAGNOSIS — L249 Irritant contact dermatitis, unspecified cause: Secondary | ICD-10-CM | POA: Diagnosis not present

## 2019-08-26 DIAGNOSIS — L578 Other skin changes due to chronic exposure to nonionizing radiation: Secondary | ICD-10-CM | POA: Diagnosis not present

## 2019-08-26 DIAGNOSIS — Z85828 Personal history of other malignant neoplasm of skin: Secondary | ICD-10-CM | POA: Diagnosis not present

## 2019-08-31 ENCOUNTER — Telehealth: Payer: Self-pay | Admitting: *Deleted

## 2019-08-31 NOTE — Telephone Encounter (Signed)
A message was left, re: his follow up visit. 

## 2019-09-09 ENCOUNTER — Other Ambulatory Visit: Payer: Self-pay | Admitting: Physician Assistant

## 2019-09-16 DIAGNOSIS — C44519 Basal cell carcinoma of skin of other part of trunk: Secondary | ICD-10-CM | POA: Diagnosis not present

## 2019-09-16 DIAGNOSIS — L905 Scar conditions and fibrosis of skin: Secondary | ICD-10-CM | POA: Diagnosis not present

## 2019-09-20 ENCOUNTER — Other Ambulatory Visit: Payer: Self-pay | Admitting: Pulmonary Disease

## 2019-09-20 ENCOUNTER — Other Ambulatory Visit: Payer: Self-pay | Admitting: Adult Health

## 2019-09-22 ENCOUNTER — Other Ambulatory Visit: Payer: Self-pay

## 2019-09-22 ENCOUNTER — Ambulatory Visit (INDEPENDENT_AMBULATORY_CARE_PROVIDER_SITE_OTHER): Payer: Medicare Other | Admitting: Cardiology

## 2019-09-22 ENCOUNTER — Encounter: Payer: Self-pay | Admitting: Cardiology

## 2019-09-22 VITALS — BP 116/64 | HR 62 | Temp 97.0°F | Ht 67.0 in | Wt 144.0 lb

## 2019-09-22 DIAGNOSIS — I480 Paroxysmal atrial fibrillation: Secondary | ICD-10-CM | POA: Diagnosis not present

## 2019-09-22 DIAGNOSIS — J449 Chronic obstructive pulmonary disease, unspecified: Secondary | ICD-10-CM

## 2019-09-22 DIAGNOSIS — Z7901 Long term (current) use of anticoagulants: Secondary | ICD-10-CM | POA: Diagnosis not present

## 2019-09-22 NOTE — Assessment & Plan Note (Signed)
12/14/2018-CT chest without contrast- advanced emphysema On chronic O2 and Sildenafil

## 2019-09-22 NOTE — Assessment & Plan Note (Signed)
Holding NSR 

## 2019-09-22 NOTE — Assessment & Plan Note (Signed)
CHADS VASC=2 for age, on Eliquis

## 2019-09-22 NOTE — Patient Instructions (Signed)
Medication Instructions:  Your physician recommends that you continue on your current medications as directed. Please refer to the Current Medication list given to you today. *If you need a refill on your cardiac medications before your next appointment, please call your pharmacy*  Lab Work: None  If you have labs (blood work) drawn today and your tests are completely normal, you will receive your results only by: . MyChart Message (if you have MyChart) OR . A paper copy in the mail If you have any lab test that is abnormal or we need to change your treatment, we will call you to review the results.  Testing/Procedures: None   Follow-Up: At [facility], you and your health needs are our priority.  As part of our continuing mission to provide you with exceptional heart care, we have created designated Provider Care Teams.  These Care Teams include your primary Cardiologist (physician) and Advanced Practice Providers (APPs -  Physician Assistants and Nurse Practitioners) who all work together to provide you with the care you need, when you need it.  Your next appointment:   6 month(s)  The format for your next appointment:   In Person  Provider:   Tiffany [place], MD  Other Instructions:  

## 2019-09-22 NOTE — Progress Notes (Signed)
Cardiology Office Note:    Date:  09/22/2019   ID:  Andrew [name], DOB 1941-02-10, MRN [id number]  PCP:  Lujean Amel, MD  Cardiologist:  No primary care provider on file.  Electrophysiologist:  None   Referring MD: Lujean Amel, MD   No chief complaint on file.   History of Present Illness:    Andrew [name] is a pleasant 78 y.o. male with a hx of pulmonary HTN on chronic O2.  He was admitted in 2018 with CAP and had PAF.  He was already on diltiazem prior to admission and metoprolol was added to his regimen. He was started on Eliquis for anticoagulation.  Thyroid function was normal.  He had an echo that admission that revealed LVEF 55-60%.  It was noted that he has pulmonary hypertension from severe COPD.   He is in the office today for routine follow up.  He was unaware of his atrial fibrillation and denies any new symptoms of tachycardia.  Overall he feels like he is doing well from a cardiac standpoint.   Past Medical History:  Diagnosis Date  . Anemia   . BiPAP (biphasic positive airway pressure) dependence    Pt denies history of OSA  . BPH (benign prostatic hyperplasia)   . Cancer ([place])    skin  . COPD (chronic obstructive pulmonary disease) ([place])   . Dysphagia   . Emphysema lung ([place])   . GERD (gastroesophageal reflux disease)    " Silent reflux"  . Hearing loss    right ear  . History of hiatal hernia   . HLD (hyperlipidemia)   . Hypertension   . Oxygen dependent    2-3 liters  . Oxygen dependent   . Pneumonia   . Pulmonary hypertension ([place])   . Sleep apnea    wears cpap    Past Surgical History:  Procedure Laterality Date  . CARDIAC CATHETERIZATION    . CATARACT EXTRACTION W/ INTRAOCULAR LENS  IMPLANT, BILATERAL    . COLONOSCOPY WITH PROPOFOL N/A 04/20/2018   Procedure: COLONOSCOPY WITH PROPOFOL;  Surgeon: Wilford Corner, MD;  Location: [place];  Service: Endoscopy;  Laterality: N/A;  . ESOPHAGOGASTRODUODENOSCOPY (EGD) WITH PROPOFOL N/A  08/23/2016   Procedure: ESOPHAGOGASTRODUODENOSCOPY (EGD) WITH PROPOFOL;  Surgeon: Wilford Corner, MD;  Location: [facility] ENDOSCOPY;  Service: Endoscopy;  Laterality: N/A;  . ESOPHAGOGASTRODUODENOSCOPY (EGD) WITH PROPOFOL N/A 04/20/2018   Procedure: ESOPHAGOGASTRODUODENOSCOPY (EGD) WITH PROPOFOL;  Surgeon: Wilford Corner, MD;  Location: [place];  Service: Endoscopy;  Laterality: N/A;  . HOT HEMOSTASIS N/A 04/20/2018   Procedure: HOT HEMOSTASIS (ARGON PLASMA COAGULATION/BICAP);  Surgeon: Wilford Corner, MD;  Location: [place];  Service: Endoscopy;  Laterality: N/A;  . LUNG SURGERY    . POLYPECTOMY  04/20/2018   Procedure: POLYPECTOMY;  Surgeon: Wilford Corner, MD;  Location: [facility] ENDOSCOPY;  Service: Endoscopy;;  . TONSILLECTOMY      Current Medications: Current Meds  Medication Sig  . albuterol (PROVENTIL HFA;VENTOLIN HFA) 108 (90 Base) MCG/ACT inhaler Inhale 2 puffs into the lungs every 6 (six) hours as needed for wheezing or shortness of breath.  Marland Kitchen apixaban (ELIQUIS) 5 MG TABS tablet Take 1 tablet (5 mg total) by mouth 2 (two) times daily.  Marland Kitchen BROVANA 15 MCG/2ML NEBU USE 1 VIAL  IN  NEBULIZER TWICE  DAILY - Morning and Evening  . budesonide (PULMICORT) 0.5 MG/2ML nebulizer solution USE 1 VIAL  IN  NEBULIZER TWICE  DAILY - Rinse mouth after treatment  . diltiazem (CARDIZEM CD) 180 MG 24  hr capsule Take 1 capsule (180 mg total) by mouth daily. *NEEDS OFFICE VISIT FOR FURTHER REFILLS*  . feeding supplement, ENSURE ENLIVE, (ENSURE ENLIVE) LIQD Take 237 mLs by mouth 2 (two) times daily between meals.  . finasteride (PROSCAR) 5 MG tablet Take 5 mg by mouth at bedtime.   . fluticasone (FLONASE) 50 MCG/ACT nasal spray Place 1 spray into both nostrils at bedtime.   Marland Kitchen guaiFENesin (MUCINEX) 600 MG 12 hr tablet Take 1 tablet (600 mg total) by mouth 2 (two) times daily.  . hydrocortisone cream 1 % Apply 1 application topically daily as needed (Eczema on face and head).  . Miconazole Nitrate  (CRITIC-AID CLEAR AF) 2 % OINT Apply 1 application topically at bedtime.  . Multiple Vitamins-Minerals (MULTIVITAMIN WITH MINERALS) tablet Take 1 tablet by mouth daily.  Marland Kitchen omeprazole (PRILOSEC) 40 MG capsule Take 40 mg by mouth 2 (two) times daily.   . OXYGEN Inhale 2-3 L into the lungs See admin instructions. 2L daytime, 3L at night   . polyethylene glycol (MIRALAX / GLYCOLAX) packet Take 17 g by mouth daily. (Patient taking differently: Take 17 g by mouth at bedtime. Mix in 8 oz liquid and drink)  . predniSONE (DELTASONE) 10 MG tablet Take 1 tablet (10 mg total) by mouth daily with breakfast.  . rosuvastatin (CRESTOR) 10 MG tablet Take 10 mg by mouth daily.  . sildenafil (REVATIO) 20 MG tablet Take 1 tablet (20 mg total) by mouth 2 (two) times daily.  Marland Kitchen Spacer/Aero-Holding Chambers (AEROCHAMBER MV) inhaler Use as instructed  . tamsulosin (FLOMAX) 0.4 MG CAPS capsule Take 1 capsule (0.4 mg total) by mouth daily after breakfast.  . temazepam (RESTORIL) 30 MG capsule Take 1 capsule (30 mg total) by mouth at bedtime.  . vitamin C (ASCORBIC ACID) 500 MG tablet Take 500 mg by mouth 2 (two) times daily.   Marland Kitchen VITAMIN D PO Take 1 tablet by mouth daily.  Maretta Bees 175 MCG/3ML SOLN USE 1 VIAL IN NEBULIZER DAILY     Allergies:   Patient has no known allergies.   Social History   Socioeconomic History  . Marital status: Divorced    Spouse name: Not on file  . Number of children: Not on file  . Years of education: Not on file  . Highest education level: Not on file  Occupational History  . Not on file  Social Needs  . Financial resource strain: Not on file  . Food insecurity    Worry: Not on file    Inability: Not on file  . Transportation needs    Medical: Not on file    Non-medical: Not on file  Tobacco Use  . Smoking status: Former Smoker    Packs/day: 0.00    Years: 0.00    Pack years: 0.00    Types: Cigarettes    Quit date: 10/01/1984    Years since quitting: 34.9  . Smokeless  tobacco: Never Used  Substance and Sexual Activity  . Alcohol use: No    Alcohol/week: 0.0 standard drinks  . Drug use: No  . Sexual activity: Not Currently  Lifestyle  . Physical activity    Days per week: Not on file    Minutes per session: Not on file  . Stress: Not on file  Relationships  . Social Herbalist on phone: Not on file    Gets together: Not on file    Attends religious service: Not on file    Active member  of club or organization: Not on file    Attends meetings of clubs or organizations: Not on file    Relationship status: Not on file  Other Topics Concern  . Not on file  Social History Narrative  . Not on file     Family History: The patient's family history includes Bone cancer in his brother; Cancer in his maternal uncle; Congestive Heart Failure in his maternal grandfather; Pancreatitis in his mother; Stroke in his maternal grandfather.  ROS:   Please see the history of present illness.     All other systems reviewed and are negative.  EKGs/Labs/Other Studies Reviewed:    The following studies were reviewed today: Echo 2018  EKG:  EKG is ordered today.  The ekg ordered today demonstrates NSR- HR 62  Recent Labs: 09/28/2018: Pro B Natriuretic peptide (BNP) 23.0; TSH 0.77 12/27/2018: B Natriuretic Peptide 51.4 12/30/2018: ALT 33; BUN 27; Creatinine, Ser 1.08; Hemoglobin 14.4; Magnesium 2.2; Platelets 151; Potassium 3.7; Sodium 138  Recent Lipid Panel No results found for: CHOL, TRIG, HDL, CHOLHDL, VLDL, LDLCALC, LDLDIRECT  Physical Exam:    VS:  BP 116/64   Pulse 62   Temp (!) 97 F (36.1 C) (Temporal)   Ht 5\' 7"  (1.702 m)   Wt 144 lb (65.3 kg)   SpO2 93%   BMI 22.55 kg/m     Wt Readings from Last 3 Encounters:  09/22/19 144 lb (65.3 kg)  07/20/19 140 lb 9.6 oz (63.8 kg)  04/19/19 135 lb 3.2 oz (61.3 kg)     GEN: Well nourished, well developed male, on O2, in no acute distress HEENT: Normal NECK: No JVD; No carotid  bruits LYMPHATICS: No lymphadenopathy CARDIAC: RRR, no murmurs, rubs, gallops RESPIRATORY:  Clear to auscultation without rales, wheezing or rhonchi  ABDOMEN: Soft, non-tender, non-distended MUSCULOSKELETAL:  No edema; No deformity  SKIN: Warm and dry NEUROLOGIC:  Alert and oriented x 3 PSYCHIATRIC:  Normal affect   ASSESSMENT:    Paroxysmal atrial fibrillation (HCC) Holding NSR  Chronic anticoagulation CHADS VASC=2 for age, on Eliquis  Stage 3 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 ([place]) 12/14/2018-CT chest without contrast- advanced emphysema On chronic O2 and Sildenafil   PLAN:    Same cardiac Rx, he is not on a beta blocker but is holding NSR.  F/U (in office) in 6 months.  Medication Adjustments/Labs and Tests Ordered: Current medicines are reviewed at length with the patient today.  Concerns regarding medicines are outlined above.  Orders Placed This Encounter  Procedures  . EKG 12-Lead   No orders of the defined types were placed in this encounter.   Patient Instructions  Medication Instructions:  Your physician recommends that you continue on your current medications as directed. Please refer to the Current Medication list given to you today. *If you need a refill on your cardiac medications before your next appointment, please call your pharmacy*  Lab Work: None  If you have labs (blood work) drawn today and your tests are completely normal, you will receive your results only by: Marland Kitchen MyChart Message (if you have MyChart) OR . A paper copy in the mail If you have any lab test that is abnormal or we need to change your treatment, we will call you to review the results.  Testing/Procedures: None   Follow-Up: At [facility], you and your health needs are our priority.  As part of our continuing mission to provide you with exceptional heart care, we have created designated Provider Care Teams.  These Care Teams include your primary Cardiologist (physician) and  Advanced Practice Providers (APPs -  Physician Assistants and Nurse Practitioners) who all work together to provide you with the care you need, when you need it.  Your next appointment:   6 months  The format for your next appointment:   In Person  Provider:   Skeet Latch, MD  Other Instructions     Signed, [name], PA-C  09/22/2019 10:57 AM    [place]

## 2019-10-12 DIAGNOSIS — C4441 Basal cell carcinoma of skin of scalp and neck: Secondary | ICD-10-CM | POA: Diagnosis not present

## 2019-10-12 DIAGNOSIS — L905 Scar conditions and fibrosis of skin: Secondary | ICD-10-CM | POA: Diagnosis not present

## 2019-10-12 DIAGNOSIS — C44612 Basal cell carcinoma of skin of right upper limb, including shoulder: Secondary | ICD-10-CM | POA: Diagnosis not present

## 2019-10-19 ENCOUNTER — Telehealth: Payer: Self-pay | Admitting: Pulmonary Disease

## 2019-10-19 NOTE — Telephone Encounter (Signed)
Will forward to nurse to look for this and have him review and sign when hes in office again.

## 2019-10-19 NOTE — Telephone Encounter (Signed)
Paper work obtained and placed in review folder. Will give to nurse working with [facility] tomorrow.

## 2019-10-21 NOTE — Telephone Encounter (Signed)
Spoke with patient. He is aware that Dr. Valeta Harms has reviewed and signed his forms. He wishes to stop by the office tomorrow to pick up forms. Will place forms up front for pickup.

## 2019-11-05 ENCOUNTER — Telehealth: Payer: Self-pay | Admitting: Pulmonary Disease

## 2019-11-05 ENCOUNTER — Encounter: Payer: Self-pay | Admitting: Pulmonary Disease

## 2019-11-05 ENCOUNTER — Other Ambulatory Visit: Payer: Self-pay

## 2019-11-05 ENCOUNTER — Ambulatory Visit (INDEPENDENT_AMBULATORY_CARE_PROVIDER_SITE_OTHER): Payer: Medicare Other | Admitting: Pulmonary Disease

## 2019-11-05 DIAGNOSIS — Z9989 Dependence on other enabling machines and devices: Secondary | ICD-10-CM

## 2019-11-05 DIAGNOSIS — J441 Chronic obstructive pulmonary disease with (acute) exacerbation: Secondary | ICD-10-CM

## 2019-11-05 DIAGNOSIS — J9611 Chronic respiratory failure with hypoxia: Secondary | ICD-10-CM | POA: Diagnosis not present

## 2019-11-05 DIAGNOSIS — G4733 Obstructive sleep apnea (adult) (pediatric): Secondary | ICD-10-CM | POA: Diagnosis not present

## 2019-11-05 DIAGNOSIS — Z7901 Long term (current) use of anticoagulants: Secondary | ICD-10-CM

## 2019-11-05 DIAGNOSIS — Z7952 Long term (current) use of systemic steroids: Secondary | ICD-10-CM

## 2019-11-05 DIAGNOSIS — J449 Chronic obstructive pulmonary disease, unspecified: Secondary | ICD-10-CM

## 2019-11-05 DIAGNOSIS — I272 Pulmonary hypertension, unspecified: Secondary | ICD-10-CM

## 2019-11-05 MED ORDER — PREDNISONE 10 MG PO TABS: ORAL_TABLET | ORAL | 0 refills | Status: DC

## 2019-11-05 MED ORDER — DOXYCYCLINE HYCLATE 100 MG PO TABS: 100.0000 mg | ORAL_TABLET | Freq: Two times a day (BID) | ORAL | 0 refills | Status: DC

## 2019-11-05 NOTE — Progress Notes (Signed)
Virtual Visit via Telephone Note  I connected with Andrew [name] on 11/05/19 at 11:00 AM EST by telephone and verified that I am speaking with the correct person using two identifiers.  Location: Patient: Home Provider: Office [facility] - [id number], [place], [place], [place] [address]   I discussed the limitations, risks, security and privacy concerns of performing an evaluation and management service by telephone and the availability of in person appointments. I also discussed with the patient that there may be a patient responsible charge related to this service. The patient expressed understanding and agreed to proceed.  Patient consented to consult via telephone: Yes People present and their role in pt care: Pt   History of Present Illness:  78 year old male former smoker followed in our office for severe COPD and chronic hypoxic respiratory failure  PMH: Hypertension,  pulmonary hypertension, GERD, BPH, hyperlipidemia, protein calorie malnutrition Smoker/ Smoking History: Former Smoker  Maintenance: Pulmicort, Candiss Norse, 10 mg of prednisone daily, azithromycin Monday Wednesday Friday  He is followed by Dr. Valeta Harms  Chief complaint: Short of breath  78 year old male former smoker followed in our office for COPD. Patient was last seen in our office in September/2020.  At that point time patient was recovering from a COPD exacerbation.  He was instructed to continue on 10 mg of prednisone daily.  He was also encouraged to continue triple therapy nebulized meds as well as Monday Wednesday Friday azithromycin.  Patient contacted our office on 11/05/2019 reporting worsening symptoms of shortness of breath and dyspnea.  He was scheduled for a televisit to further evaluate.  Patient is prone to COPD exacerbations.  Patient reporting today that he has had 3 to 4 days of increased dyspnea.  He denies worsened oxygen needs, denies cough, denies discolored sputum production.   He denies fevers.  He denies known sick contacts.  He reports that he feels like how he felt back in August/2020 when he last had a COPD exacerbation.  Observations/Objective:  12/14/2018-CT chest without contrast- advanced emphysema, patchy mucoid impaction of bronchi, arthrosclerosis  01/07/2019-pulmonary function test- FVC 2.14 (59% predicted), postbronchodilator ratio 38, postbronchodilator FEV1 0.80 (31% predicted), no bronchodilator response, DLCO 24  09/10/2017-echocardiogram-LV ejection fraction 55 to 60%, PA peak pressure 33  01/21/2019- ECG:  QTC 421  04/19/2019-EKG- sinus rhythm, rate 75, QTc 422  Social History   Tobacco Use  Smoking Status Former Smoker  . Packs/day: 0.00  . Years: 0.00  . Pack years: 0.00  . Types: Cigarettes  . Quit date: 10/01/1984  . Years since quitting: 35.1  Smokeless Tobacco Never Used   Immunization History  Administered Date(s) Administered  . Fluad Quad(high Dose 65+) 07/20/2019  . Influenza, High Dose Seasonal PF 07/22/2016, 08/18/2017, 08/17/2018  . Influenza-Unspecified 09/19/2015  . PPD Test 02/15/2016  . Pneumococcal Conjugate-13 09/19/2015  . Pneumococcal Polysaccharide-23 12/20/2016      Assessment and Plan:  Stage 3 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 ([place]) Plan: Prednisone taper today, then resume daily prednisone use Continue Brovana, Pulmicort, Yupelri, prednisone 10 mg daily, Monday Wednesday Friday azithromycin Okay to use duo nebs as needed Actively work increasing physical activity Continue oxygen therapy as prescribed  Does not sound that the patient needs antibiotic changes at this time.  We will send doxycycline so the patient has the option to start that if symptoms worsen over the weekend to help limit urgent care use as well as emergency room use.  Explained to patient that unless cough worsens, sputum  color changes, fevers develop to remain on the Monday Wednesday Friday azithromycin.  If the patient does start  the doxycycline he knows to hold the Monday Wednesday Friday azithromycin and to contact our office to notify that he has started the doxycycline.   OSA on CPAP Plan: Continue CPAP therapy  Chronic hypoxemic respiratory failure (HCC) Plan: Continue oxygen therapy as prescribed Notify our office if oxygen levels are below 88%  Pulmonary hypertension Likely WHO group 3  Plan: Continue sildenafil Continue CPAP therapy  COPD with acute exacerbation ([place]) Plan: Start prednisone taper, then return to 10 mg daily use We will send a prescription of doxycycline for the patient to have as an option if symptoms worsen over the weekend to help avoid urgent care or emergency room use, discussed this with patient at length.   Follow Up Instructions:  Return in about 6 weeks (around 12/17/2019), or if symptoms worsen or fail to improve, for Follow up with Dr. Valeta Harms, Follow up with Wyn Quaker FNP-C.   I discussed the assessment and treatment plan with the patient. The patient was provided an opportunity to ask questions and all were answered. The patient agreed with the plan and demonstrated an understanding of the instructions.   The patient was advised to call back or seek an in-person evaluation if the symptoms worsen or if the condition fails to improve as anticipated.  I provided 22 minutes of non-face-to-face time during this encounter.   [name], NP

## 2019-11-05 NOTE — Assessment & Plan Note (Addendum)
Plan: Prednisone taper today, then resume daily prednisone use Continue Brovana, Pulmicort, Yupelri, prednisone 10 mg daily, Monday Wednesday Friday azithromycin Okay to use duo nebs as needed Actively work increasing physical activity Continue oxygen therapy as prescribed  Does not sound that the patient needs antibiotic changes at this time.  We will send doxycycline so the patient has the option to start that if symptoms worsen over the weekend to help limit urgent care use as well as emergency room use.  Explained to patient that unless cough worsens, sputum color changes, fevers develop to remain on the Monday Wednesday Friday azithromycin.  If the patient does start the doxycycline he knows to hold the Monday Wednesday Friday azithromycin and to contact our office to notify that he has started the doxycycline.

## 2019-11-05 NOTE — Assessment & Plan Note (Signed)
Likely WHO group 3  Plan: Continue sildenafil Continue CPAP therapy

## 2019-11-05 NOTE — Assessment & Plan Note (Signed)
Plan: Continue oxygen therapy as prescribed Notify our office if oxygen levels are below 88%

## 2019-11-05 NOTE — Assessment & Plan Note (Signed)
Plan: Continue CPAP therapy 

## 2019-11-05 NOTE — Assessment & Plan Note (Signed)
Plan: Start prednisone taper, then return to 10 mg daily use We will send a prescription of doxycycline for the patient to have as an option if symptoms worsen over the weekend to help avoid urgent care or emergency room use, discussed this with patient at length.

## 2019-11-05 NOTE — Patient Instructions (Signed)
You were seen today by [name], NP  for:   1. COPD with acute exacerbation (HCC) 2. Stage 3 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 ([place])  - predniSONE (DELTASONE) 10 MG tablet; 4 tabs for 2 days, then 3 tabs for 2 days, 2 tabs for 2 days, then 1 tab for 2 days, then stop  Dispense: 20 tablet; Refill: 0 - doxycycline (VIBRA-TABS) 100 MG tablet; Take 1 tablet (100 mg total) by mouth 2 (two) times daily.  Dispense: 14 tablet; Refill: 0  Explained to patient that doxycycline will be sent to the pharmacy as an option if symptoms worsen.  To help limit her emergency room use as well as urgent care use.  I do not believe the patient needs to start doxycycline today at this time.  He agrees.  Patient knows to take prednisone taper and then when finished with prednisone taper he needs to continue on his 10 mg of prednisone daily.  Continue Brovana nebulized meds twice daily Continue Pulmicort nebulized meds twice daily Continue Yupelri nebulized meds daily  Continue prednisone 10 mg daily after finishing prednisone taper  Continue Monday Wednesday Friday azithromycin >>> If patient has to start doxycycline he will hold the Monday Wednesday Friday azithromycin  3. Pulmonary hypertension ([place]) 4. OSA on CPAP 5. Chronic hypoxemic respiratory failure (HCC)  Continue oxygen therapy as prescribed  >>>maintain oxygen saturations greater than 88 percent  >>>if unable to maintain oxygen saturations please contact the office  >>>do not smoke with oxygen  >>>can use nasal saline gel or nasal saline rinses to moisturize nose if oxygen causes dryness  We recommend that you continue using your CPAP daily >>>Keep up the hard work using your device >>> Goal should be wearing this for the entire night that you are sleeping, at least 4 to 6 hours  Remember:  . Do not drive or operate heavy machinery if tired or drowsy.  . Please notify the supply company and office if you are unable to use your device  regularly due to missing supplies or machine being broken.  . Work on maintaining a healthy weight and following your recommended nutrition plan  . Maintain proper daily exercise and movement  . Maintaining proper use of your device can also help improve management of other chronic illnesses such as: Blood pressure, blood sugars, and weight management.   BiPAP/ CPAP Cleaning:  >>>Clean weekly, with Dawn soap, and bottle brush.  Set up to air dry. >>> Wipe mask out daily with wet wipe or towelette  Continue sildenafil  6. Current chronic use of systemic steroids  After finishing prednisone taper, resume 10 mg of prednisone daily  7. Chronic anticoagulation  Continue Eliquis    We recommend today:  No orders of the defined types were placed in this encounter.  No orders of the defined types were placed in this encounter.  Meds ordered this encounter  Medications  . predniSONE (DELTASONE) 10 MG tablet    Sig: 4 tabs for 2 days, then 3 tabs for 2 days, 2 tabs for 2 days, then 1 tab for 2 days, then stop    Dispense:  20 tablet    Refill:  0  . doxycycline (VIBRA-TABS) 100 MG tablet    Sig: Take 1 tablet (100 mg total) by mouth 2 (two) times daily.    Dispense:  14 tablet    Refill:  0    Follow Up:    Return in about 6 weeks (around 12/17/2019), or  if symptoms worsen or fail to improve, for Follow up with Dr. Valeta Harms, Follow up with Wyn Quaker FNP-C.   Please do your part to reduce the spread of COVID-19:      Reduce your risk of any infection  and COVID19 by using the similar precautions used for avoiding the common cold or flu:  Marland Kitchen Wash your hands often with soap and warm water for at least 20 seconds.  If soap and water are not readily available, use an alcohol-based hand sanitizer with at least 60% alcohol.  . If coughing or sneezing, cover your mouth and nose by coughing or sneezing into the elbow areas of your shirt or coat, into a tissue or into your sleeve (not your  hands). Langley Gauss A MASK when in public  . Avoid shaking hands with others and consider head nods or verbal greetings only. . Avoid touching your eyes, nose, or mouth with unwashed hands.  . Avoid close contact with people who are sick. . Avoid places or events with large numbers of people in one location, like concerts or sporting events. . If you have some symptoms but not all symptoms, continue to monitor at home and seek medical attention if your symptoms worsen. . If you are having a medical emergency, call 911.   [place] / e-Visit: [email or web address]         [facility]: [facility]: [id number]                   [facility]: [id number]     It is flu season:   >>> Best ways to protect herself from the flu: Receive the yearly flu vaccine, practice good hand hygiene washing with soap and also using hand sanitizer when available, eat a nutritious meals, get adequate rest, hydrate appropriately   Please contact the office if your symptoms worsen or you have concerns that you are not improving.   Thank you for choosing [facility] for your healthcare, and for allowing [place] to partner with you on your healthcare journey. I am thankful to be able to provide care to you today.   Wyn Quaker FNP-C

## 2019-11-09 NOTE — Telephone Encounter (Signed)
Seems like encounter was open in error so closing encounter.  

## 2019-11-11 ENCOUNTER — Other Ambulatory Visit: Payer: Self-pay

## 2019-11-11 ENCOUNTER — Encounter: Payer: Self-pay | Admitting: Pulmonary Disease

## 2019-11-11 ENCOUNTER — Ambulatory Visit (INDEPENDENT_AMBULATORY_CARE_PROVIDER_SITE_OTHER): Payer: Medicare Other | Admitting: Pulmonary Disease

## 2019-11-11 ENCOUNTER — Telehealth: Payer: Self-pay | Admitting: Pulmonary Disease

## 2019-11-11 DIAGNOSIS — Z791 Long term (current) use of non-steroidal anti-inflammatories (NSAID): Secondary | ICD-10-CM | POA: Diagnosis not present

## 2019-11-11 DIAGNOSIS — I272 Pulmonary hypertension, unspecified: Secondary | ICD-10-CM | POA: Diagnosis not present

## 2019-11-11 DIAGNOSIS — J9611 Chronic respiratory failure with hypoxia: Secondary | ICD-10-CM

## 2019-11-11 DIAGNOSIS — Z87891 Personal history of nicotine dependence: Secondary | ICD-10-CM | POA: Diagnosis not present

## 2019-11-11 DIAGNOSIS — G4733 Obstructive sleep apnea (adult) (pediatric): Secondary | ICD-10-CM

## 2019-11-11 DIAGNOSIS — Z7952 Long term (current) use of systemic steroids: Secondary | ICD-10-CM

## 2019-11-11 DIAGNOSIS — J441 Chronic obstructive pulmonary disease with (acute) exacerbation: Secondary | ICD-10-CM | POA: Diagnosis not present

## 2019-11-11 DIAGNOSIS — Z9989 Dependence on other enabling machines and devices: Secondary | ICD-10-CM

## 2019-11-11 MED ORDER — PREDNISONE 10 MG PO TABS: ORAL_TABLET | ORAL | 0 refills | Status: DC

## 2019-11-11 NOTE — Assessment & Plan Note (Signed)
Plan: Continue oxygen therapy as prescribed Notify our office if oxygen levels are below 88%

## 2019-11-11 NOTE — Telephone Encounter (Signed)
Called the patient back and he stated he did complete the medication issued based on his 11/05/19 televisit with Wyn Quaker, NP. However, he can only walk 20 - 30 steps and gets short of breath again. Has to sit down and it takes 3 - 5 minutes to recover. Patient denied any chest tightness, wheezing or cough.  Based on conversation with Aaron Edelman, the patient has been added to the schedule today as televisit for additional evaluation. Patient made aware. Nothing further needed at this time.

## 2019-11-11 NOTE — Assessment & Plan Note (Signed)
Plan: Prednisone taper today, then resume daily prednisone use Start doxy Continue Brovana, Pulmicort, Yupelri, prednisone 10 mg daily, Monday Wednesday Friday azithromycin Okay to use duo nebs as needed Actively work increasing physical activity Continue oxygen therapy as prescribed Follow up in [place] next week

## 2019-11-11 NOTE — Assessment & Plan Note (Signed)
Likely WHO group 3  Plan: Continue sildenafil Continue CPAP therapy

## 2019-11-11 NOTE — Assessment & Plan Note (Signed)
Plan: Continue CPAP therapy 

## 2019-11-11 NOTE — Progress Notes (Signed)
Virtual Visit via Telephone Note  I connected with Andrew [name]  on 11/11/19 at 12:00 PM EST by telephone and verified that I am speaking with the correct person using two identifiers.  Location: Patient: Home Provider: [facility] - [id number], [place], [place], [place] [address]   I discussed the limitations, risks, security and privacy concerns of performing an evaluation and management service by telephone and the availability of in person appointments. I also discussed with the patient that there may be a patient responsible charge related to this service. The patient expressed understanding and agreed to proceed.  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 Valeta Harms  Chief complaint: recent televisit 11/05/2019  78 year old male former smoker followed in our office for COPD.  Patient is prone to frequent COPD exacerbations.  He was last treated empirically on 11/05/2019 with a course of prednisone.  Patient felt the symptoms did improve on the course of prednisone but now that he is back to his daily dose of 10 mg he is still having shortness of breath.  Patient is audibly winded with telephone call.  Patient has not started doxycycline which he was prescribed last week to start if he started to produce discolored mucus.    Observations/Objective:  12/14/2018-CT chest without contrast- advanced emphysema, patchy mucoid impaction of bronchi, arthrosclerosis  01/07/2019-pulmonary function test- FVC 2.14 (59%  predicted), postbronchodilator ratio 38, postbronchodilator FEV1 0.80 (31% predicted), no bronchodilator response, DLCO 24  09/10/2017-echocardiogram-LV ejection fraction 55 to 60%, PA peak pressure 33  01/21/2019- ECG:  QTC 421  04/19/2019-EKG- sinus rhythm, rate 75, QTc 422  Social History   Tobacco Use  Smoking Status Former Smoker  . Packs/day: 0.00  . Years: 0.00  . Pack years: 0.00  . Types: Cigarettes  . Quit date: 10/01/1984  . Years since quitting: 35.1  Smokeless Tobacco Never Used   Immunization History  Administered Date(s) Administered  . Fluad Quad(high Dose 65+) 07/20/2019  . Influenza, High Dose Seasonal PF 07/22/2016, 08/18/2017, 08/17/2018  . Influenza-Unspecified 09/19/2015  . PPD Test 02/15/2016  . Pneumococcal Conjugate-13 09/19/2015  . 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  Plan: Continue sildenafil Continue CPAP therapy  COPD with acute exacerbation ([place]) Plan: Prednisone taper today, then resume daily prednisone use Start doxy Continue Brovana, Pulmicort, Yupelri, prednisone 10 mg daily, Monday Wednesday Friday azithromycin Okay to use duo nebs as needed Actively work increasing physical activity Continue oxygen therapy as prescribed Follow up in [place] next week     OSA on CPAP Plan: Continue CPAP therapy   Follow Up Instructions:  Return in about 1 week (around 11/18/2019), or if symptoms worsen or fail to improve, for Follow up with Wyn Quaker FNP-C.   I discussed the assessment and treatment plan with the patient. The patient was provided an opportunity to ask questions and all were answered. The patient agreed with the plan  and demonstrated an understanding of the instructions.   The patient was advised to call back or seek  an in-person evaluation if the symptoms worsen or if the condition fails to improve as anticipated.  I provided 22 minutes of non-face-to-face time during this encounter.   [name], NP

## 2019-11-11 NOTE — Patient Instructions (Addendum)
You were seen today by [name], NP  for:   1. COPD with acute exacerbation (HCC)  - predniSONE (DELTASONE) 10 MG tablet; 4 tabs for 2 days, then 3 tabs for 2 days, 2 tabs for 2 days, then 1 tab for 2 days, then stop  Dispense: 20 tablet; Refill: 0  Continue Brovana nebulized meds twice daily Continue Pulmicort nebulized meds twice daily Continue Yupelri nebulized meds daily  Continue prednisone 10 mg daily after finishing prednisone taper  Doxycycline >>> 1 100 mg tablet every 12 hours for 7 days >>>take with food  >>>wear sunscreen   Hold  Monday Wednesday Friday azithromycin >>> If patient has to start doxycycline he will hold the Monday Wednesday Friday azithromycin  2. Chronic hypoxemic respiratory failure (HCC)  Continue oxygen therapy as prescribed  >>>maintain oxygen saturations greater than 88 percent  >>>if unable to maintain oxygen saturations please contact the office  >>>do not smoke with oxygen  >>>can use nasal saline gel or nasal saline rinses to moisturize nose if oxygen causes dryness   3. OSA on CPAP  We recommend that you continue using your CPAP daily >>>Keep up the hard work using your device >>> Goal should be wearing this for the entire night that you are sleeping, at least 4 to 6 hours  Remember:  . Do not drive or operate heavy machinery if tired or drowsy.  . Please notify the supply company and office if you are unable to use your device regularly due to missing supplies or machine being broken.  . Work on maintaining a healthy weight and following your recommended nutrition plan  . Maintain proper daily exercise and movement  . Maintaining proper use of your device can also help improve management of other chronic illnesses such as: Blood pressure, blood sugars, and weight management.   BiPAP/ CPAP Cleaning:  >>>Clean weekly, with Dawn soap, and bottle brush.  Set up to air dry. >>> Wipe mask out daily with wet wipe or towelette     We  recommend today:  Meds ordered this encounter  Medications  . predniSONE (DELTASONE) 10 MG tablet    Sig: 4 tabs for 2 days, then 3 tabs for 2 days, 2 tabs for 2 days, then 1 tab for 2 days, then stop    Dispense:  20 tablet    Refill:  0    Follow Up:    Return in about 1 week (around 11/18/2019), or if symptoms worsen or fail to improve, for Follow up with Wyn Quaker FNP-C.   Please do your part to reduce the spread of COVID-19:      Reduce your risk of any infection  and COVID19 by using the similar precautions used for avoiding the common cold or flu:  Marland Kitchen Wash your hands often with soap and warm water for at least 20 seconds.  If soap and water are not readily available, use an alcohol-based hand sanitizer with at least 60% alcohol.  . If coughing or sneezing, cover your mouth and nose by coughing or sneezing into the elbow areas of your shirt or coat, into a tissue or into your sleeve (not your hands). Langley Gauss A MASK when in public  . Avoid shaking hands with others and consider head nods or verbal greetings only. . Avoid touching your eyes, nose, or mouth with unwashed hands.  . Avoid close contact with people who are sick. . Avoid places or events with large numbers of people in one  location, like concerts or sporting events. . If you have some symptoms but not all symptoms, continue to monitor at home and seek medical attention if your symptoms worsen. . If you are having a medical emergency, call 911.   [place] / e-Visit: [email or web address]         [facility]: [facility]: [id number]                   [facility]: [id number]     It is flu season:   >>> Best ways to protect herself from the flu: Receive the yearly flu vaccine, practice good hand hygiene washing with soap and also using hand sanitizer when  available, eat a nutritious meals, get adequate rest, hydrate appropriately   Please contact the office if your symptoms worsen or you have concerns that you are not improving.   Thank you for choosing [facility] for your healthcare, and for allowing [place] to partner with you on your healthcare journey. I am thankful to be able to provide care to you today.   Wyn Quaker FNP-C

## 2019-11-18 ENCOUNTER — Encounter: Payer: Self-pay | Admitting: Pulmonary Disease

## 2019-11-18 ENCOUNTER — Other Ambulatory Visit: Payer: Self-pay

## 2019-11-18 ENCOUNTER — Ambulatory Visit (INDEPENDENT_AMBULATORY_CARE_PROVIDER_SITE_OTHER): Payer: Medicare Other | Admitting: Pulmonary Disease

## 2019-11-18 ENCOUNTER — Ambulatory Visit (INDEPENDENT_AMBULATORY_CARE_PROVIDER_SITE_OTHER): Payer: Medicare Other

## 2019-11-18 VITALS — BP 118/64 | HR 69 | Temp 97.9°F | Ht 67.0 in | Wt 142.0 lb

## 2019-11-18 DIAGNOSIS — Z7952 Long term (current) use of systemic steroids: Secondary | ICD-10-CM

## 2019-11-18 DIAGNOSIS — J441 Chronic obstructive pulmonary disease with (acute) exacerbation: Secondary | ICD-10-CM

## 2019-11-18 DIAGNOSIS — J449 Chronic obstructive pulmonary disease, unspecified: Secondary | ICD-10-CM | POA: Diagnosis not present

## 2019-11-18 DIAGNOSIS — R0602 Shortness of breath: Secondary | ICD-10-CM

## 2019-11-18 DIAGNOSIS — Z9989 Dependence on other enabling machines and devices: Secondary | ICD-10-CM

## 2019-11-18 DIAGNOSIS — Z7901 Long term (current) use of anticoagulants: Secondary | ICD-10-CM

## 2019-11-18 DIAGNOSIS — I272 Pulmonary hypertension, unspecified: Secondary | ICD-10-CM

## 2019-11-18 DIAGNOSIS — J9611 Chronic respiratory failure with hypoxia: Secondary | ICD-10-CM

## 2019-11-18 DIAGNOSIS — G4733 Obstructive sleep apnea (adult) (pediatric): Secondary | ICD-10-CM

## 2019-11-18 NOTE — Assessment & Plan Note (Signed)
Likely WHO group 3  Plan: Continue sildenafil Continue CPAP therapy Lab work today

## 2019-11-18 NOTE — Assessment & Plan Note (Signed)
Status post 2 courses of prednisone tapers as well as an empiric course of doxycycline  Chest x-ray today reveals no acute abnormality

## 2019-11-18 NOTE — Progress Notes (Signed)
PCCM: Thanks for seeing him. It appears he is on maximal therapy.  Garner Nash, DO [facility] 11/18/2019 7:35 AM

## 2019-11-18 NOTE — Progress Notes (Signed)
@Patient  ID: Andrew [name], male    DOB: 1940/12/05, 78 y.o.   MRN: [id number]  Chief Complaint  Patient presents with  . Follow-up    F/U for increased SOB. Had a televisit on 12/24. Still not feeling any better.     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  Patient presenting to our office after being treated telephonically for 2 visits over the last month.  He has received 2 courses of steroids as well as a course of doxycycline.  Patient is continued to have persistent dyspnea.  He reports his oxygen levels have been stable, patient denies worsening cough or congestion.  He does have occasionally a wheeze.  This happens at his baseline.  He reports adherence to his Pulmicort, Garlon Hatchet, Yupelri nebulized meds as well as 10 mg of prednisone daily as well as Monday Wednesday Friday azithromycin.  Patient also has stable vital signs today.  He continues to be fully anticoagulated on Eliquis.  Weights are slightly increased over the last year but stable in regards to the last month.  Patient was requested to present to our office today so we can obtain chest x-ray imaging as well as physically evaluate the patient in office.  Questionaires / Pulmonary Flowsheets:   MMRC: mMRC Dyspnea Scale mMRC Score  11/18/2019 3  07/20/2019 3   Tests:   11/18/2019-chest x-ray-no acute abnormality, chronic severe bullous emphysema  12/14/2018-CT chest without contrast- advanced  emphysema, patchy mucoid impaction of bronchi, arthrosclerosis  01/07/2019-pulmonary function test- FVC 2.14 (59% predicted), postbronchodilator ratio 38, postbronchodilator FEV1 0.80 (31% predicted), no bronchodilator response, DLCO 24  09/10/2017-echocardiogram-LV ejection fraction 55 to 60%, PA peak pressure 33  01/21/2019- ECG:  QTC 421  04/19/2019-EKG- sinus rhythm, rate 75, QTc 422  FENO:  No results found for: NITRICOXIDE  PFT: PFT Results Latest Ref Rng & Units 01/07/2019  FVC-Pre L 2.14  FVC-Predicted Pre % 59  FVC-Post L 2.09  FVC-Predicted Post % 58  Pre FEV1/FVC % % 40  Post FEV1/FCV % % 38  FEV1-Pre L 0.87  FEV1-Predicted Pre % 34  FEV1-Post L 0.80  DLCO UNC% % 24  DLCO COR %Predicted % 44  TLC L 8.03  TLC % Predicted % 124  RV % Predicted % 223    WALK:  SIX MIN WALK 11/25/2016 10/02/2015  Supplimental Oxygen during Test? (L/min) Yes Yes  O2 Flow Rate 3 2  Type Pulse Pulse  Tech Comments: pt placed on 3 liters cont and o2 up to 92% with HR at 83 Pt became SOB after 2nd lap and requested to go back to his room     Imaging: DG Chest 2 View  Result Date: 11/18/2019 CLINICAL DATA:  Acute exacerbation of COPD. EXAM: CHEST - 2 VIEW COMPARISON:  12/27/2018. FINDINGS: Lungs are hyperexpanded. There changes of bullous emphysema matter stable. Stable pulmonary anastomosis staples are noted along the right mid lung. There is no lung consolidation to suggest pneumonia. No evidence of pulmonary edema. No pleural effusion or pneumothorax. Cardiac silhouette is normal in  size. No mediastinal or hilar masses or evidence of adenopathy. Skeletal structures are intact. IMPRESSION: 1. No acute cardiopulmonary disease. 2. Advanced COPD with changes of bullous emphysema, stable from the prior study. Electronically Signed   By: Lajean Manes M.D.   On: 11/18/2019 12:00    Lab Results:  CBC    Component Value Date/Time   WBC 8.4 12/30/2018 0902   RBC 4.32 12/30/2018 0902   HGB 14.4  12/30/2018 0902   HCT 43.0 12/30/2018 0902   PLT 151 12/30/2018 0902   MCV 99.5 12/30/2018 0902   MCH 33.3 12/30/2018 0902   MCHC 33.5 12/30/2018 0902   RDW 12.5 12/30/2018 0902   LYMPHSABS 0.7 12/30/2018 0902   MONOABS 0.4 12/30/2018 0902   EOSABS 0.0 12/30/2018 0902   BASOSABS 0.0 12/30/2018 0902    BMET    Component Value Date/Time   NA 138 12/30/2018 0902   K 3.7 12/30/2018 0902   CL 97 (L) 12/30/2018 0902   CO2 30 12/30/2018 0902   GLUCOSE 168 (H) 12/30/2018 0902   BUN 27 (H) 12/30/2018 0902   CREATININE 1.08 12/30/2018 0902   CALCIUM 8.8 (L) 12/30/2018 0902   GFRNONAA >60 12/30/2018 0902   GFRAA >60 12/30/2018 0902    BNP    Component Value Date/Time   BNP 51.4 12/27/2018 0104    ProBNP    Component Value Date/Time   PROBNP 23.0 09/28/2018 1242    Specialty Problems      Pulmonary Problems   Chronic hypoxemic respiratory failure (HCC)   Stage 3 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 ([place])    12/14/2018-CT chest without contrast- advanced emphysema, patchy mucoid impaction of bronchi, arthrosclerosis  01/07/2019-pulmonary function test- FVC 2.14 (59% predicted), postbronchodilator ratio 38, postbronchodilator FEV1 0.80 (31% predicted), no bronchodilator response, DLCO 24       COPD with acute exacerbation (HCC)   OSA on CPAP      No Known Allergies  Immunization History  Administered Date(s) Administered  . Fluad Quad(high Dose 65+) 07/20/2019  . Influenza, High Dose Seasonal PF 07/22/2016, 08/18/2017, 08/17/2018  . Influenza-Unspecified 09/19/2015  . PPD Test 02/15/2016  . Pneumococcal Conjugate-13 09/19/2015  . Pneumococcal Polysaccharide-23 12/20/2016    Past Medical History:  Diagnosis Date  . Anemia   . BiPAP (biphasic positive airway pressure) dependence    Pt denies history of OSA  . BPH (benign prostatic hyperplasia)   . Cancer ([place])    skin  . COPD (chronic obstructive pulmonary disease) ([place])   . Dysphagia   . Emphysema lung ([place])    . GERD (gastroesophageal reflux disease)    " Silent reflux"  . Hearing loss    right ear  . History of hiatal hernia   . HLD (hyperlipidemia)   . Hypertension   . Oxygen dependent    2-3 liters  . Oxygen dependent   . Pneumonia   . Pulmonary hypertension ([place])   . Sleep apnea    wears cpap    Tobacco History: Social History   Tobacco Use  Smoking Status Former Smoker  . Packs/day: 0.00  . Years: 0.00  . Pack years: 0.00  . Types: Cigarettes  . Quit date: 10/01/1984  . Years since quitting: 35.1  Smokeless Tobacco Never Used   Counseling given: Yes   Continue to not smoke  Outpatient Encounter Medications as of 11/18/2019  Medication Sig  . albuterol (PROVENTIL HFA;VENTOLIN HFA) 108 (90 Base) MCG/ACT inhaler Inhale 2 puffs into the lungs every 6 (  six) hours as needed for wheezing or shortness of breath.  Marland Kitchen apixaban (ELIQUIS) 5 MG TABS tablet Take 1 tablet (5 mg total) by mouth 2 (two) times daily.  Marland Kitchen BROVANA 15 MCG/2ML NEBU USE 1 VIAL  IN  NEBULIZER TWICE  DAILY - Morning and Evening  . budesonide (PULMICORT) 0.5 MG/2ML nebulizer solution USE 1 VIAL  IN  NEBULIZER TWICE  DAILY - Rinse mouth after treatment  . diltiazem (CARDIZEM CD) 180 MG 24 hr capsule Take 1 capsule (180 mg total) by mouth daily. *NEEDS OFFICE VISIT FOR FURTHER REFILLS*  . doxycycline (VIBRA-TABS) 100 MG tablet Take 1 tablet (100 mg total) by mouth 2 (two) times daily.  . feeding supplement, ENSURE ENLIVE, (ENSURE ENLIVE) LIQD Take 237 mLs by mouth 2 (two) times daily between meals.  . finasteride (PROSCAR) 5 MG tablet Take 5 mg by mouth at bedtime.   . fluticasone (FLONASE) 50 MCG/ACT nasal spray Place 1 spray into both nostrils at bedtime.   Marland Kitchen guaiFENesin (MUCINEX) 600 MG 12 hr tablet Take 1 tablet (600 mg total) by mouth 2 (two) times daily.  . hydrocortisone cream 1 % Apply 1 application topically daily as needed (Eczema on face and head).  . Miconazole Nitrate (CRITIC-AID CLEAR AF) 2 % OINT  Apply 1 application topically at bedtime.  . Multiple Vitamins-Minerals (MULTIVITAMIN WITH MINERALS) tablet Take 1 tablet by mouth daily.  Marland Kitchen omeprazole (PRILOSEC) 40 MG capsule Take 40 mg by mouth 2 (two) times daily.   . OXYGEN Inhale 2-3 L into the lungs See admin instructions. 2L daytime, 3L at night   . polyethylene glycol (MIRALAX / GLYCOLAX) packet Take 17 g by mouth daily. (Patient taking differently: Take 17 g by mouth at bedtime. Mix in 8 oz liquid and drink)  . predniSONE (DELTASONE) 10 MG tablet Take 1 tablet (10 mg total) by mouth daily with breakfast.  . rosuvastatin (CRESTOR) 10 MG tablet Take 10 mg by mouth daily.  . sildenafil (REVATIO) 20 MG tablet Take 1 tablet (20 mg total) by mouth 2 (two) times daily.  Marland Kitchen Spacer/Aero-Holding Chambers (AEROCHAMBER MV) inhaler Use as instructed  . tamsulosin (FLOMAX) 0.4 MG CAPS capsule Take 1 capsule (0.4 mg total) by mouth daily after breakfast.  . temazepam (RESTORIL) 30 MG capsule Take 1 capsule (30 mg total) by mouth at bedtime.  . vitamin C (ASCORBIC ACID) 500 MG tablet Take 500 mg by mouth 2 (two) times daily.   Marland Kitchen VITAMIN D PO Take 1 tablet by mouth daily.  . YUPELRI 175 MCG/3ML SOLN USE 1 VIAL IN NEBULIZER DAILY  . [DISCONTINUED] predniSONE (DELTASONE) 10 MG tablet 4 tabs for 2 days, then 3 tabs for 2 days, 2 tabs for 2 days, then 1 tab for 2 days, then stop   No facility-administered encounter medications on file as of 11/18/2019.     Review of Systems  Review of Systems  Constitutional: Positive for fatigue. Negative for activity change, chills, fever and unexpected weight change.  HENT: Negative for postnasal drip, rhinorrhea, sinus pressure, sinus pain and sore throat.   Eyes: Negative.   Respiratory: Positive for shortness of breath and wheezing. Negative for cough.   Cardiovascular: Negative for chest pain, palpitations and leg swelling.  Gastrointestinal: Negative for constipation, diarrhea, nausea and vomiting.    Endocrine: Negative.   Genitourinary: Negative.   Musculoskeletal: Negative.   Skin: Negative.   Neurological: Negative for dizziness and headaches.  Psychiatric/Behavioral: Negative.  Negative for dysphoric mood. The patient is not  nervous/anxious.   All other systems reviewed and are negative.    Physical Exam  BP 118/64 (BP Location: Right Arm, Patient Position: Sitting, Cuff Size: Normal)   Pulse 69   Temp 97.9 F (36.6 C) (Temporal)   Ht 5\' 7"  (1.702 m)   Wt 142 lb (64.4 kg)   SpO2 100% Comment: on 2L pulse  BMI 22.24 kg/m   Wt Readings from Last 5 Encounters:  11/18/19 142 lb (64.4 kg)  09/22/19 144 lb (65.3 kg)  07/20/19 140 lb 9.6 oz (63.8 kg)  04/19/19 135 lb 3.2 oz (61.3 kg)  01/26/19 133 lb (60.3 kg)    BMI Readings from Last 5 Encounters:  11/18/19 22.24 kg/m  09/22/19 22.55 kg/m  07/20/19 22.02 kg/m  04/19/19 21.18 kg/m  01/26/19 20.83 kg/m     Physical Exam Vitals and nursing note reviewed.  Constitutional:      General: He is not in acute distress.    Appearance: Normal appearance. He is normal weight.  HENT:     Head: Normocephalic and atraumatic.     Right Ear: Hearing and external ear normal.     Left Ear: Hearing and external ear normal.     Nose: No mucosal edema.     Right Turbinates: Not enlarged.     Left Turbinates: Not enlarged.  Eyes:     Pupils: Pupils are equal, round, and reactive to light.  Cardiovascular:     Rate and Rhythm: Normal rate and regular rhythm.     Pulses: Normal pulses.     Heart sounds: Normal heart sounds. No murmur.  Pulmonary:     Effort: Pulmonary effort is normal.     Breath sounds: No decreased breath sounds, wheezing or rales.     Comments: Diminished breath sounds throughout exam, likely due to severe bullous emphysema Musculoskeletal:     Cervical back: Normal range of motion.     Right lower leg: No edema.     Left lower leg: No edema.  Lymphadenopathy:     Cervical: No cervical adenopathy.   Skin:    General: Skin is warm and dry.     Capillary Refill: Capillary refill takes less than 2 seconds.     Findings: No erythema or rash.  Neurological:     General: No focal deficit present.     Mental Status: He is alert and oriented to person, place, and time.     Motor: No weakness.     Coordination: Coordination normal.     Gait: Gait is intact. Gait normal.  Psychiatric:        Mood and Affect: Mood normal.        Behavior: Behavior normal. Behavior is cooperative.        Thought Content: Thought content normal.        Judgment: Judgment normal.       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  Plan: Continue sildenafil Continue CPAP therapy Lab work today  Chronic hypoxemic respiratory failure (HCC) Increase shortness of breath but no worsened oxygen needs  Plan: Continue oxygen therapy as prescribed Notify our office if oxygen levels are below 88%   COPD with acute exacerbation ([place]) Status post 2 courses of prednisone tapers as well as an empiric course of doxycycline  Chest x-ray today reveals no acute abnormality  OSA on CPAP Plan: Continue CPAP therapy  Stage 3 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 ([place]) Plan: Continue Brovana, Pulmicort, Yupelri, prednisone 10  mg daily, Monday Wednesday Friday azithromycin Okay to use duo nebs as needed Chest x-ray today reveals no acute abnormality Lab work ordered today Continue oxygen therapy as prescribed Follow-up with our office in 6 to 8 weeks  Chronic anticoagulation Plan: Continue Eliquis  Current chronic use of systemic steroids Patient continues to prefer to remain on chronic systemic steroids, we have reviewed in the past the risk of being maintained on systemic steroids, he agrees to continue to use systemic steroids despite that risk.  Plan: Continue 10 mg of prednisone daily    Return in about 2 months (around 01/16/2020), or if symptoms worsen or fail to improve, for  Follow up with Dr. Valeta Harms.   [name], NP 11/18/2019   This appointment was 32 minutes long with over 50% of the time in direct face-to-face patient care, assessment, plan of care, and follow-up.

## 2019-11-18 NOTE — Assessment & Plan Note (Signed)
Plan: Continue CPAP therapy 

## 2019-11-18 NOTE — Assessment & Plan Note (Signed)
Increase shortness of breath but no worsened oxygen needs  Plan: Continue oxygen therapy as prescribed Notify our office if oxygen levels are below 88%

## 2019-11-18 NOTE — Assessment & Plan Note (Signed)
Plan: Continue Eliquis 

## 2019-11-18 NOTE — Patient Instructions (Addendum)
You were seen today by [name], NP  for:   1. Stage 3 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 (Andrew [name]) 2. COPD with acute exacerbation ([place]) - DG Chest 2 View; Future 3. Current chronic use of systemic steroids  Continue Yupelri nebulized meds daily Continue Pulmicort nebulized meds twice daily Continue Brovana nebulized meds twice daily  Continue 10 mg of prednisone daily  Continue Monday Wednesday Friday azithromycin  4. Chronic hypoxemic respiratory failure (HCC)  Continue oxygen therapy as prescribed  >>>maintain oxygen saturations greater than 88 percent  >>>if unable to maintain oxygen saturations please contact the office  >>>do not smoke with oxygen  >>>can use nasal saline gel or nasal saline rinses to moisturize nose if oxygen causes dryness  5. Pulmonary hypertension (HCC)  Continue Revatio daily   6. Shortness of breath  - CBC with Differential/Platelet; Future - Comp Met (CMET); Future - B Nat Peptide; Future  7. Chronic anticoagulation  Continue Eliquis   We recommend today:  Orders Placed This Encounter  Procedures  . DG Chest 2 View    Standing Status:   Future    Number of Occurrences:   1    Standing Expiration Date:   01/15/2021    Order Specific Question:   Reason for Exam (SYMPTOM  OR DIAGNOSIS REQUIRED)    Answer:   short of breath    Order Specific Question:   Preferred imaging location?    Answer:   Internal    Order Specific Question:   Radiology Contrast Protocol - do NOT remove file path    Answer:   [email or web address]  . B Nat Peptide    Standing Status:   Future    Number of Occurrences:   1    Standing Expiration Date:   11/17/2020  . Comp Met (CMET)    Standing Status:   Future    Number of Occurrences:   1    Standing Expiration Date:   11/17/2020  . CBC with Differential/Platelet    Standing Status:   Future    Number of Occurrences:   1    Standing Expiration Date:   11/17/2020    Orders Placed This Encounter  Procedures  . DG Chest 2 View  . B Nat Peptide  . Comp Met (CMET)  . CBC with Differential/Platelet   No orders of the defined types were placed in this encounter.   Follow Up:    Return in about 2 months (around 01/16/2020), or if symptoms worsen or fail to improve, for Follow up with Dr. Valeta Harms.   Please do your part to reduce the spread of COVID-19:      Reduce your risk of any infection  and COVID19 by using the similar precautions used for avoiding the common cold or flu:  Marland Kitchen Wash your hands often with soap and warm water for at least 20 seconds.  If soap and water are not readily available, use an alcohol-based hand sanitizer with at least 60% alcohol.  . If coughing or sneezing, cover your mouth and nose by coughing or sneezing into the elbow areas of your shirt or coat, into a tissue or into your sleeve (not your hands). Langley Gauss A MASK when in public  . Avoid shaking hands with others and consider head nods or verbal greetings only. . Avoid touching your eyes, nose, or mouth with unwashed hands.  . Avoid close contact with people who are sick. . Avoid places or events with large numbers of  people in one location, like concerts or sporting events. . If you have some symptoms but not all symptoms, continue to monitor at home and seek medical attention if your symptoms worsen. . If you are having a medical emergency, call 911.   [place] / e-Visit: [email or web address]         [facility]: [facility]: [phone]                   [facility]: [phone]     It is flu season:   >>> Best ways to protect herself from the flu: Receive the yearly flu vaccine, practice good hand hygiene washing with soap and also using hand sanitizer when available, eat a nutritious meals, get adequate  rest, hydrate appropriately   Please contact the office if your symptoms worsen or you have concerns that you are not improving.   Thank you for choosing [facility] for your healthcare, and for allowing [place] to partner with you on your healthcare journey. I am thankful to be able to provide care to you today.   Wyn Quaker FNP-C

## 2019-11-18 NOTE — Assessment & Plan Note (Signed)
Patient continues to prefer to remain on chronic systemic steroids, we have reviewed in the past the risk of being maintained on systemic steroids, he agrees to continue to use systemic steroids despite that risk.  Plan: Continue 10 mg of prednisone daily

## 2019-11-18 NOTE — Assessment & Plan Note (Signed)
Plan: Continue Brovana, Pulmicort, Yupelri, prednisone 10 mg daily, Monday Wednesday Friday azithromycin Okay to use duo nebs as needed Chest x-ray today reveals no acute abnormality Lab work ordered today Continue oxygen therapy as prescribed Follow-up with our office in 6 to 8 weeks

## 2019-11-20 LAB — CBC WITH DIFFERENTIAL/PLATELET
Absolute Monocytes: 491 cells/uL (ref 200–950)
Basophils Absolute: 8 cells/uL (ref 0–200)
Basophils Relative: 0.1 %
Eosinophils Absolute: 8 cells/uL — ABNORMAL LOW (ref 15–500)
Eosinophils Relative: 0.1 %
HCT: 45.7 % (ref 38.5–50.0)
Hemoglobin: 15.6 g/dL (ref 13.2–17.1)
Lymphs Abs: 663 cells/uL — ABNORMAL LOW (ref 850–3900)
MCH: 32.9 pg (ref 27.0–33.0)
MCHC: 34.1 g/dL (ref 32.0–36.0)
MCV: 96.4 fL (ref 80.0–100.0)
MPV: 9.5 fL (ref 7.5–12.5)
Monocytes Relative: 6.3 %
Neutro Abs: 6630 cells/uL (ref 1500–7800)
Neutrophils Relative %: 85 %
Platelets: 238 10*3/uL (ref 140–400)
RBC: 4.74 10*6/uL (ref 4.20–5.80)
RDW: 11.8 % (ref 11.0–15.0)
Total Lymphocyte: 8.5 %
WBC: 7.8 10*3/uL (ref 3.8–10.8)

## 2019-11-20 LAB — COMPREHENSIVE METABOLIC PANEL
AG Ratio: 2.2 (calc) (ref 1.0–2.5)
ALT: 19 U/L (ref 9–46)
AST: 13 U/L (ref 10–35)
Albumin: 4.2 g/dL (ref 3.6–5.1)
Alkaline phosphatase (APISO): 54 U/L (ref 35–144)
BUN: 25 mg/dL (ref 7–25)
CO2: 28 mmol/L (ref 20–32)
Calcium: 9.4 mg/dL (ref 8.6–10.3)
Chloride: 106 mmol/L (ref 98–110)
Creat: 1.11 mg/dL (ref 0.70–1.11)
Globulin: 1.9 g/dL (calc) (ref 1.9–3.7)
Glucose, Bld: 113 mg/dL — ABNORMAL HIGH (ref 65–99)
Potassium: 5.2 mmol/L (ref 3.5–5.3)
Sodium: 142 mmol/L (ref 135–146)
Total Bilirubin: 0.6 mg/dL (ref 0.2–1.2)
Total Protein: 6.1 g/dL (ref 6.1–8.1)

## 2019-11-20 LAB — BRAIN NATRIURETIC PEPTIDE: Brain Natriuretic Peptide: 18 pg/mL (ref <100)

## 2019-11-22 NOTE — Progress Notes (Signed)
Pt notified lab results and no change in therapy as fluid marker wnl. Nothing further needed at this times.

## 2019-12-13 ENCOUNTER — Other Ambulatory Visit: Payer: Self-pay | Admitting: Cardiovascular Disease

## 2019-12-18 ENCOUNTER — Telehealth: Payer: Self-pay | Admitting: Pulmonary Disease

## 2019-12-18 NOTE — Telephone Encounter (Signed)
Followed by Dr. Valeta Harms and Wyn Quaker for severe COPD with emphysema.  He is maintained on yupelri, pulmicort, brovana, and prednisone 10 mg daily.  Had more trouble breathing overnight and this morning.  SpO2 level 90% or higher.  Not having purulent sputum, chest pain, fever, or hemoptysis.    Advised him to increase prednisone to 20 mg daily for 1/30 and 1/31.  If no improvement, then he should call to schedule video visit in the office early next week.

## 2019-12-18 NOTE — Telephone Encounter (Signed)
Thanks for speaking with him. He has appt to see me 2/3 Garner Nash, DO [facility] 12/18/2019 3:23 PM

## 2019-12-19 NOTE — Telephone Encounter (Signed)
Thanks VS.   Aaron Edelman

## 2019-12-22 ENCOUNTER — Other Ambulatory Visit: Payer: Self-pay

## 2019-12-22 ENCOUNTER — Encounter: Payer: Self-pay | Admitting: Pulmonary Disease

## 2019-12-22 ENCOUNTER — Ambulatory Visit (INDEPENDENT_AMBULATORY_CARE_PROVIDER_SITE_OTHER): Payer: Medicare Other | Admitting: Pulmonary Disease

## 2019-12-22 ENCOUNTER — Other Ambulatory Visit: Payer: Self-pay | Admitting: Pulmonary Disease

## 2019-12-22 VITALS — BP 116/64 | HR 65 | Temp 97.3°F | Ht 67.0 in | Wt 144.5 lb

## 2019-12-22 DIAGNOSIS — D849 Immunodeficiency, unspecified: Secondary | ICD-10-CM | POA: Diagnosis not present

## 2019-12-22 DIAGNOSIS — Z66 Do not resuscitate: Secondary | ICD-10-CM

## 2019-12-22 DIAGNOSIS — J449 Chronic obstructive pulmonary disease, unspecified: Secondary | ICD-10-CM

## 2019-12-22 DIAGNOSIS — J432 Centrilobular emphysema: Secondary | ICD-10-CM

## 2019-12-22 MED ORDER — MORPHINE SULFATE 15 MG PO TABS: 7.5000 mg | ORAL_TABLET | Freq: Two times a day (BID) | ORAL | 0 refills | Status: DC | PRN

## 2019-12-22 NOTE — Patient Instructions (Addendum)
Thank you for visiting Dr. Valeta Harms at [facility]. Today we recommend the following:  Orders Placed This Encounter  Procedures  . Ambulatory referral to Hospice   Meds ordered this encounter  Medications  . morphine (MSIR) 15 MG tablet    Sig: Take 0.5 tablets (7.5 mg total) by mouth every 12 (twelve) hours as needed for moderate pain or severe pain (Dyspnea, comfort).    Dispense:  30 tablet    Refill:  0   Return in about 6 months (around 06/20/2020).  Or as needed based on symptomatology.  We will see sooner but hospice will be in contact with [place] regularly.    Please do your part to reduce the spread of COVID-19.

## 2019-12-22 NOTE — Progress Notes (Signed)
Synopsis: Referred in Jan. 2020 to establish care with me, patient of Dr. Lenna Gilford.  Subjective:   PATIENT ID: Andrew [name] GENDER: male DOB: Jan 22, 1941, MRN: [id number]  Chief Complaint  Patient presents with  . Follow-up    f/u Stage 3 COPD/ OSA.    PMH COPD, h/o of two belbectomy at [facility], in 2007. Using albuterol as needed and Spiriva and Advair for >10 years. He has not been tried on anything else at the time. He has been on prednisone 75m almost every day for the past 2-3 months.  In the past several years he has been in the hospital at least 6 times he states.  He is currently oxygen dependent overall doing well.  [facility]  He states that he has a newfound reason for living and that is his new girlfriend.  They met at church.  Overall his breathing is about the same.  He feels like he is at least able to complete most of his activities of daily living.  He does have dyspnea with extensive exertion but otherwise okay.  Last FEV1 was completed on office spirometry 37% predicted 2016.  OV 01/21/2019: He was recently admitted to the hospital for an exacerbation.  During this time he was put back up on 10 mg of prednisone a day.  He was at one point able to taper himself off to 6 mg a day.  [facility]  He was switched recently from inhaler regimens to nebulized medications to include Pulmicort, Brovana and scheduled duo nebs.  He is currently using the HandiHaler as well.  Overall he states that his respiratory status is at his previous baseline.  He does feel somewhat better he is a little bit concerned that coming off prednisone was a reason for his flare.  Patient denies fevers, chills night sweats weight loss.  OV 12/22/2019: Patient has been seen multiple times throughout this past year in the our clinic predominantly followed by BWyn Quaker NP.  At this point patient is maintained on chronic steroid therapy.  He  has chronic hypoxemic respiratory failure on maximal inhaler regimens.  Still has significant dyspnea and shortness of breath.  He is in his wheelchair today.  Predominantly spends a lot of time in chair or couch at home.  Has significant personal dyspnea with any activities of daily living.  He has lots of resolved today regarding his end-stage disease.  And is requesting a referral to hospice care after further discussions.   Past Medical History:  Diagnosis Date  . Anemia   . BiPAP (biphasic positive airway pressure) dependence    Pt denies history of OSA  . BPH (benign prostatic hyperplasia)   . Cancer ([place]    skin  . COPD (chronic obstructive pulmonary disease) ([place]   . Dysphagia   . Emphysema lung ([place]   . GERD (gastroesophageal reflux disease)    " Silent reflux"  . Hearing loss    right ear  . History of hiatal hernia   . HLD (hyperlipidemia)   . Hypertension   . Oxygen dependent    2-3 liters  . Oxygen dependent   . Pneumonia   . Pulmonary hypertension ([place]   . Sleep apnea    wears cpap     Family History  Problem Relation Age of Onset  . Cancer Maternal Uncle   . Pancreatitis Mother   . Bone cancer Brother   .  Stroke Maternal Grandfather   . Congestive Heart Failure Maternal Grandfather      Past Surgical History:  Procedure Laterality Date  . CARDIAC CATHETERIZATION    . CATARACT EXTRACTION W/ INTRAOCULAR LENS  IMPLANT, BILATERAL    . COLONOSCOPY WITH PROPOFOL N/A 04/20/2018   Procedure: COLONOSCOPY WITH PROPOFOL;  Surgeon: Wilford Corner, MD;  Location: [place];  Service: Endoscopy;  Laterality: N/A;  . ESOPHAGOGASTRODUODENOSCOPY (EGD) WITH PROPOFOL N/A 08/23/2016   Procedure: ESOPHAGOGASTRODUODENOSCOPY (EGD) WITH PROPOFOL;  Surgeon: Wilford Corner, MD;  Location[facility] ENDOSCOPY;  Service: Endoscopy;  Laterality: N/A;  . ESOPHAGOGASTRODUODENOSCOPY (EGD) WITH PROPOFOL N/A 04/20/2018   Procedure: ESOPHAGOGASTRODUODENOSCOPY (EGD) WITH PROPOFOL;   Surgeon: Wilford Corner, MD;  Location: [place];  Service: Endoscopy;  Laterality: N/A;  . HOT HEMOSTASIS N/A 04/20/2018   Procedure: HOT HEMOSTASIS (ARGON PLASMA COAGULATION/BICAP);  Surgeon: Wilford Corner, MD;  Location: [place];  Service: Endoscopy;  Laterality: N/A;  . LUNG SURGERY    . POLYPECTOMY  04/20/2018   Procedure: POLYPECTOMY;  Surgeon: Wilford Corner, MD;  Location: [facility] END[facility]COPY;  Service: Endoscopy;;  . TONSILLECTOMY      Social History   Socioeconomic History  . Marital status: Divorced    Spouse name: Not on file  . Number of children: Not on file  . Years of education: Not on file  . Highest education level: Not on file  Occupational History  . Not on file  Tobacco Use  . Smoking status: Former Smoker    Packs/day: 0.00    Years: 0.00    Pack years: 0.00    Types: Cigarettes    Quit date: 10/01/1984    Years since quitting: 35.2  . Smokeless tobacco: Never Used  Substance and Sexual Activity  . Alcohol use: No    Alcohol/week: 0.0 standard drinks  . Drug use: No  . Sexual activity: Not Currently  Other Topics Concern  . Not on file  Social History Narrative  . Not on file   Social Determinants of Health   Financial Resource Strain:   . Difficulty of Paying Living Expenses: Not on file  Food Insecurity:   . Worried About Charity fundraiser in the Last Year: Not on file  . Ran Out of Food in the Last Year: Not on file  Transportation Needs:   . Lack of Transportation (Medical): Not on file  . Lack of Transportation (Non-Medical): Not on file  Physical Activity:   . Days of Exercise per Week: Not on file  . Minutes of Exercise per Session: Not on file  Stress:   . Feeling of Stress : Not on file  Social Connections:   . Frequency of Communication with Friends and Family: Not on file  . Frequency of Social Gatherings with Friends and Family: Not on file  . Attends Religious Services: Not on file  . Active Member of Clubs or  Organizations: Not on file  . Attends Archivist Meetings: Not on file  . Marital Status: Not on file  Intimate Partner Violence:   . Fear of Current or Ex-Partner: Not on file  . Emotionally Abused: Not on file  . Physically Abused: Not on file  . Sexually Abused: Not on file     No Known Allergies   Outpatient Medications Prior to Visit  Medication Sig Dispense Refill  . albuterol (PROVENTIL HFA;VENTOLIN HFA) 108 (90 Base) MCG/ACT inhaler Inhale 2 puffs into the lungs every 6 (six) hours as needed for wheezing or shortness  of breath. 1 Inhaler 11  . apixaban (ELIQUIS) 5 MG TABS tablet Take 1 tablet (5 mg total) by mouth 2 (two) times daily. 180 tablet 3  . BROVANA 15 MCG/2ML NEBU USE 1 VIAL  IN  NEBULIZER TWICE  DAILY - Morning and Evening 120 mL 5  . budesonide (PULMICORT) 0.5 MG/2ML nebulizer solution USE 1 VIAL  IN  NEBULIZER TWICE  DAILY - Rinse mouth after treatment 120 mL 5  . doxycycline (VIBRA-TABS) 100 MG tablet Take 1 tablet (100 mg total) by mouth 2 (two) times daily. 14 tablet 0  . feeding supplement, ENSURE ENLIVE, (ENSURE ENLIVE) LIQD Take 237 mLs by mouth 2 (two) times daily between meals. 237 mL 12  . finasteride (PROSCAR) 5 MG tablet Take 5 mg by mouth at bedtime.     . fluticasone (FLONASE) 50 MCG/ACT nasal spray Place 1 spray into both nostrils at bedtime.     Marland Kitchen guaiFENesin (MUCINEX) 600 MG 12 hr tablet Take 1 tablet (600 mg total) by mouth 2 (two) times daily. 30 tablet 0  . hydrocortisone cream 1 % Apply 1 application topically daily as needed (Eczema on face and head).    . Miconazole Nitrate (CRITIC-AID CLEAR AF) 2 % OINT Apply 1 application topically at bedtime.    . Multiple Vitamins-Minerals (MULTIVITAMIN WITH MINERALS) tablet Take 1 tablet by mouth daily.    Marland Kitchen omeprazole (PRILOSEC) 40 MG capsule Take 40 mg by mouth 2 (two) times daily.     . OXYGEN Inhale 2-3 L into the lungs See admin instructions. 2L daytime, 3L at night     . polyethylene glycol  (MIRALAX / GLYCOLAX) packet Take 17 g by mouth daily. (Patient taking differently: Take 17 g by mouth at bedtime. Mix in 8 oz liquid and drink) 14 each 1  . predniSONE (DELTASONE) 10 MG tablet Take 1 tablet (10 mg total) by mouth daily with breakfast. 30 tablet 6  . rosuvastatin (CRESTOR) 10 MG tablet Take 10 mg by mouth daily.    . sildenafil (REVATIO) 20 MG tablet Take 1 tablet (20 mg total) by mouth 2 (two) times daily. 180 tablet 3  . Spacer/Aero-Holding Chambers (AEROCHAMBER MV) inhaler Use as instructed 1 each 0  . tamsulosin (FLOMAX) 0.4 MG CAPS capsule Take 1 capsule (0.4 mg total) by mouth daily after breakfast. 30 capsule 0  . temazepam (RESTORIL) 30 MG capsule Take 1 capsule (30 mg total) by mouth at bedtime. 30 capsule 5  . vitamin C (ASCORBIC ACID) 500 MG tablet Take 500 mg by mouth 2 (two) times daily.     Marland Kitchen VITAMIN D PO Take 1 tablet by mouth daily.    Maretta Bees 175 MCG/3ML SOLN USE 1 VIAL IN NEBULIZER DAILY 30 mL 11  . diltiazem (CARDIZEM CD) 180 MG 24 hr capsule TAKE 1 CAPSULE EVERY DAY (NEEDS OFFICE VISIT FOR FURTHER REFILLS) (Patient not taking: Reported on 12/22/2019) 30 capsule 0   No facility-administered medications prior to visit.    Review of Systems  Constitutional: Negative for chills, fever, malaise/fatigue and weight loss.  HENT: Negative for hearing loss, sore throat and tinnitus.   Eyes: Negative for blurred vision and double vision.  Respiratory: Positive for sputum production, shortness of breath and wheezing. Negative for cough, hemoptysis and stridor.   Cardiovascular: Negative for chest pain, palpitations, orthopnea, leg swelling and PND.  Gastrointestinal: Negative for abdominal pain, constipation, diarrhea, heartburn, nausea and vomiting.  Genitourinary: Negative for dysuria, hematuria and urgency.  Musculoskeletal: Negative for  joint pain and myalgias.  Skin: Negative for itching and rash.  Neurological: Negative for dizziness, tingling, weakness and  headaches.  Endo/Heme/Allergies: Negative for environmental allergies. Does not bruise/bleed easily.  Psychiatric/Behavioral: Negative for depression. The patient is not nervous/anxious and does not have insomnia.   All other systems reviewed and are negative.    Objective:  Physical Exam Vitals reviewed.  Constitutional:      General: He is not in acute distress.    Appearance: He is well-developed.  HENT:     Head: Normocephalic and atraumatic.     Mouth/Throat:     Pharynx: No oropharyngeal exudate.  Eyes:     Conjunctiva/sclera: Conjunctivae normal.     Pupils: Pupils are equal, round, and reactive to light.  Neck:     Vascular: No JVD.     Trachea: No tracheal deviation.     Comments: Loss of supraclavicular fat Cardiovascular:     Rate and Rhythm: Normal rate and regular rhythm.     Heart sounds: S1 normal and S2 normal.     Comments: Distant heart tones Pulmonary:     Effort: No tachypnea or accessory muscle usage.     Breath sounds: No stridor. Decreased breath sounds (throughout all lung fields) and wheezing present. No rhonchi or rales.  Abdominal:     General: Bowel sounds are normal. There is no distension.     Palpations: Abdomen is soft.     Tenderness: There is no abdominal tenderness.  Musculoskeletal:        General: Deformity (muscle wasting ) present.  Skin:    General: Skin is warm and dry.     Capillary Refill: Capillary refill takes less than 2 seconds.     Findings: No rash.  Neurological:     Mental Status: He is alert and oriented to person, place, and time.  Psychiatric:        Behavior: Behavior normal.      Vitals:   12/22/19 1143  BP: 116/64  Pulse: 65  Temp: (!) 97.3 F (36.3 C)  TempSrc: Temporal  SpO2: 97%  Weight: 144 lb 8 oz (65.5 kg)  Height: _0  (1.702 m)   97% on 2 LPM  BMI Readings from Last 3 Encounters:  12/22/19 22.63 kg/m  11/18/19 22.24 kg/m  09/22/19 22.55 kg/m   Wt Readings from Last 3 Encounters:    12/22/19 144 lb 8 oz (65.5 kg)  11/18/19 142 lb (64.4 kg)  09/22/19 144 lb (65.3 kg)     CBC    Component Value Date/Time   WBC 7.8 11/18/2019 1209   RBC 4.74 11/18/2019 1209   HGB 15.6 11/18/2019 1209   HCT 45.7 11/18/2019 1209   PLT 238 11/18/2019 1209   MCV 96.4 11/18/2019 1209   MCH 32.9 11/18/2019 1209   MCHC 34.1 11/18/2019 1209   RDW 11.8 11/18/2019 1209   LYMPHSABS 663 (L) 11/18/2019 1209   MONOABS 0.4 12/30/2018 0902   EOSABS 8 (L) 11/18/2019 1209   BASOSABS 8 11/18/2019 1209    Chest Imaging: Patient has had no prior axial CT imaging.  Pulmonary Functions Testing Results: PFT Results Latest Ref Rng & Units 01/07/2019  FVC-Pre L 2.14  FVC-Predicted Pre % 59  FVC-Post L 2.09  FVC-Predicted Post % 58  Pre FEV1/FVC % % 40  Post FEV1/FCV % % 38  FEV1-Pre L 0.87  FEV1-Predicted Pre % 34  FEV1-Post L 0.80  DLCO UNC% % 24  DLCO COR %Predicted % 44  TLC L 8.03  TLC % Predicted % 124  RV % Predicted % 223    FeNO: None   Pathology: None   Echocardiogram: None   Heart Catheterization: None   ECG:  QTC 421    Assessment & Plan:   End stage COPD ([place]) - Plan: Ambulatory referral to Hospice  Centrilobular emphysema ([place])  Stage 3 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 ([place])  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  Also on azithromycin Monday Wednesday Friday.  We had a long discussion today in the office about his goals of care.  At this point we have made decisions for referral to outpatient hospice.  We will continue his current respiratory medications to help him breathe as best as possible.  He unfortunately has having ongoing progressive symptoms and will likely pass from this disease within the next 6 months to a year therefore I think transition to consideration for hospice care is  appropriate.  We will also start oral morphine to help with dyspnea 7.5 mg every 12 hours as needed.  A new prescription for this was sent to the [facility].  Referral was placed to hospice, [facility] with the diagnosis of end-stage COPD.  I will service as his hospice physician.  Plan Following Extensive Data Review & Interpretation:  . I reviewed prior external note(s) from 11/18/2019, Wyn Quaker, NP.  Telephone call over the weekend with worsening respiratory symptoms prednisone increased by on-call physician to 20 mg daily.  12/08/2018 [facility] ear nose and throat complaints of chronic sinusitis recommending saline nasal spray and mupirocin ointment twice daily.  Follow-up related predominantly to continuous oxygen delivery. . I reviewed the result(s) of 11/18/2019 CBC unremarkable, BNP 18, CMP, unremarkable, creatinine 1.11.  I have ordered new prescription morphine 7.5 mg every 12 hours as needed for dyspnea  Independent interpretation of tests . Review of patient's 11/18/2019 2 view chest x-ray images revealed severe bullous changes within the bilateral lung fields consistent with COPD history. The patient's images have been independently reviewed by me.    Discussion of management with another colleague Dr. Halford Chessman, telephone message from this past weekend reviewed.  Case discussed with Wyn Quaker, NP after last office visit in December 2020.  We will complete durable DNR forms today in the office as well as [facility] forms. Originals will be sent with the patient Copies will be sent and scanned into the electronic medical record  17 minutes was spent with advanced care planning today in the office.  RTC 6 months or as needed based on symptoms.  I will service as his hospice physician.    [facility] 12/22/2019 12:02 PM

## 2019-12-23 ENCOUNTER — Telehealth: Payer: Self-pay | Admitting: Pulmonary Disease

## 2019-12-23 NOTE — Telephone Encounter (Signed)
PCCM:  Yes, I am willing to service for his hospice care.  Garner Nash, DO [facility] 12/23/2019 3:42 PM

## 2019-12-23 NOTE — Telephone Encounter (Signed)
Spoke with Andrew [name], she would like to know if Dr. Valeta Harms will be willing to sign patient care orders for pt as the attending physician. Dr. Valeta Harms please advise.

## 2019-12-24 ENCOUNTER — Telehealth: Payer: Self-pay | Admitting: Pulmonary Disease

## 2019-12-24 NOTE — Telephone Encounter (Signed)
I called Jenn and left a detailed message on VM. Advising Jenn that Dr. Valeta Harms is ok with signing orders for comfort care. Nothing further is needed.

## 2019-12-24 NOTE — Telephone Encounter (Signed)
Dr. Valeta Harms has already agreed to palliative care and a note was sent through [place].. Will close encounter.

## 2019-12-28 ENCOUNTER — Telehealth: Payer: Self-pay

## 2019-12-28 ENCOUNTER — Telehealth: Payer: Self-pay | Admitting: Licensed Clinical Social Worker

## 2019-12-28 DIAGNOSIS — Z85828 Personal history of other malignant neoplasm of skin: Secondary | ICD-10-CM | POA: Diagnosis not present

## 2019-12-28 DIAGNOSIS — Z85820 Personal history of malignant melanoma of skin: Secondary | ICD-10-CM | POA: Diagnosis not present

## 2019-12-28 DIAGNOSIS — L821 Other seborrheic keratosis: Secondary | ICD-10-CM | POA: Diagnosis not present

## 2019-12-28 DIAGNOSIS — L570 Actinic keratosis: Secondary | ICD-10-CM | POA: Diagnosis not present

## 2019-12-28 DIAGNOSIS — C44622 Squamous cell carcinoma of skin of right upper limb, including shoulder: Secondary | ICD-10-CM | POA: Diagnosis not present

## 2019-12-28 DIAGNOSIS — D485 Neoplasm of uncertain behavior of skin: Secondary | ICD-10-CM | POA: Diagnosis not present

## 2019-12-28 DIAGNOSIS — L905 Scar conditions and fibrosis of skin: Secondary | ICD-10-CM | POA: Diagnosis not present

## 2019-12-28 DIAGNOSIS — L814 Other melanin hyperpigmentation: Secondary | ICD-10-CM | POA: Diagnosis not present

## 2019-12-28 NOTE — Telephone Encounter (Signed)
Noted agreement from Dr. Valeta Harms for Palliative care for patient.

## 2019-12-28 NOTE — Telephone Encounter (Signed)
Palliative Care SW spoke with patient and scheduled a home visit for Friday, 2/12, at 1pm.

## 2019-12-29 ENCOUNTER — Ambulatory Visit: Payer: Medicare Other | Attending: Internal Medicine

## 2019-12-29 DIAGNOSIS — Z20822 Contact with and (suspected) exposure to covid-19: Secondary | ICD-10-CM

## 2019-12-30 LAB — NOVEL CORONAVIRUS, NAA: SARS-CoV-2, NAA: NOT DETECTED

## 2019-12-31 ENCOUNTER — Other Ambulatory Visit: Payer: Medicare Other | Admitting: Licensed Clinical Social Worker

## 2019-12-31 ENCOUNTER — Other Ambulatory Visit: Payer: Self-pay

## 2019-12-31 ENCOUNTER — Other Ambulatory Visit: Payer: Medicare Other | Admitting: *Deleted

## 2019-12-31 DIAGNOSIS — Z515 Encounter for palliative care: Secondary | ICD-10-CM

## 2020-01-03 ENCOUNTER — Other Ambulatory Visit: Payer: Self-pay | Admitting: Cardiovascular Disease

## 2020-01-03 ENCOUNTER — Other Ambulatory Visit: Payer: Self-pay

## 2020-01-03 NOTE — Progress Notes (Signed)
COMMUNITY PALLIATIVE CARE SW NOTE  PATIENT NAME: Andrew [name] DOB: 1941/02/15 MRN: [id number]  PRIMARY CARE PROVIDER: Lujean Amel, MD  RESPONSIBLE PARTY:  Acct ID - Guarantor Home Phone Work Phone Relationship Acct Type  [id number] Andrew [name], Andrew [name] [phone]  Self P/F     [address] Andrew [name], Andrew [name] Andrew [name]     PLAN OF CARE and INTERVENTIONS:             1. GOALS OF CARE/ ADVANCE CARE PLANNING:  Goal is for patient to remain in his home with his daughter, Andrew [name], and her family.  He has a DNR and MOST form. 2. SOCIAL/EMOTIONAL/SPIRITUAL ASSESSMENT/ INTERVENTIONS:  SW and Palliative Care RN, Andrew [name], met with patient, his daughter, Andrew [name], and her family in patient's home.  Their power was out causing some stress in the home.  Patient was utilizing O2 tanks.  Power did resume during the visit.  Patient had several medical questions directed at the nurse.  He also stated that he was ready to die and accepting of his terminal illness.  His daughter, however, expressed distress when he made these statements.  SW provided active listening and supportive counseling. 3. PATIENT/CAREGIVER EDUCATION/ COPING:  Provided folder and education regarding Palliative Care. 4. PERSONAL EMERGENCY PLAN:  Family will contact EMS. 5. COMMUNITY RESOURCES COORDINATION/ HEALTH CARE NAVIGATION:  None. 6. FINANCIAL/LEGAL CONCERNS/INTERVENTIONS:  None.     SOCIAL HX:  Social History   Tobacco Use  . Smoking status: Former Smoker    Packs/day: 0.00    Years: 0.00    Pack years: 0.00    Types: Cigarettes    Quit date: 10/01/1984    Years since quitting: 35.2  . Smokeless tobacco: Never Used  Substance Use Topics  . Alcohol use: No    Alcohol/week: 0.0 standard drinks    CODE STATUS:   ADVANCED DIRECTIVES: N MOST FORM COMPLETE:  Y HOSPICE EDUCATION PROVIDED: Y PPS:  Appetite is normal.  He uses a walker to ambulate. Duration of visit and documentation:  60 minutes.      Andrew Corn Shatona Andujar,  Andrew [name]

## 2020-01-04 NOTE — Progress Notes (Signed)
COMMUNITY PALLIATIVE CARE RN NOTE  PATIENT NAME: Andrew [name] DOB: 04-15-1941 MRN: [id number]  PRIMARY CARE PROVIDER: Lujean Amel, MD  RESPONSIBLE PARTY:  Acct ID - Guarantor Home Phone Work Phone Relationship Acct Type  [id number] DEMETRION, [name]  Self P/F     [address] [place], [place] [address]   Covid-19 Pre-screening Negative  PLAN OF CARE and INTERVENTION:  1. ADVANCE CARE PLANNING/GOALS OF CARE: Goal is for patient to remain in his home with his daughter and have a good quality of life. He has a DNR and a MOST form. 2. PATIENT/CAREGIVER EDUCATION: Explained Palliative care services, Symptom management 3. DISEASE STATUS: Joint visit made with Palliative care SW, Andrew [name]. Met with patient and his daughter in his home. Patient is alert and oriented x 3 and able to provide his health history. He denies pain at this time. He does become dyspneic with exertion, but is able to recover fairly quickly once at rest. He is currently on Oxygen continuously at 2L/min via [place]. His power is currently out, so he is using his E-tank until his power is restored. He is taking Morphine tablets twice daily and he feels this has helped him significantly with his shortness of breath. He is able to perform all ADLs indepdently.  He is ambulating in his home currently without the use of assistive devices, but does have a walker. He has a good appetite. Denies dysphagia. He is continent of both bowel and bladder. He is agreeable to future visit with Palliative care. We discussed goals of care and contents of his MOST form which is DNR, Full Scope of Treatment, Antibiotics if indicated, IV fluids if indicated and no feeding tube. Will continue to monitor.   HISTORY OF PRESENT ILLNESS:  This is a 79 yo male with a diagnosis of COPD, who resides at home with his daughter. He has a very supportive family. Palliative care team asked to follow patient for additional support. Will visit patient weekly and  PRN.   CODE STATUS: DNR ADVANCED DIRECTIVES: N MOST FORM: yes PPS: 50%   PHYSICAL EXAM:   VITALS: Today's Vitals   12/31/19 1335  BP: (!) 143/86  Pulse: 79  Resp: 18  Temp: (!) 97 F (36.1 C)  TempSrc: Other (Comment)  SpO2: 94%  PainSc: 0-No pain    LUNGS: clear to auscultation  CARDIAC: Cor RRR EXTREMITIES: No edema SKIN: Skin color, texture, turgor normal. No rashes or lesions  NEURO: Alert and oriented x 3, pleasant mood, ambulatory   (Duration of visit and documentation 75 minutes)   Andrew Eastern, RN BSN

## 2020-01-12 DIAGNOSIS — G4700 Insomnia, unspecified: Secondary | ICD-10-CM | POA: Diagnosis not present

## 2020-01-12 DIAGNOSIS — N400 Enlarged prostate without lower urinary tract symptoms: Secondary | ICD-10-CM | POA: Diagnosis not present

## 2020-01-12 DIAGNOSIS — I4821 Permanent atrial fibrillation: Secondary | ICD-10-CM | POA: Diagnosis not present

## 2020-01-12 DIAGNOSIS — D849 Immunodeficiency, unspecified: Secondary | ICD-10-CM | POA: Diagnosis not present

## 2020-01-12 DIAGNOSIS — J9611 Chronic respiratory failure with hypoxia: Secondary | ICD-10-CM | POA: Diagnosis not present

## 2020-01-12 DIAGNOSIS — I10 Essential (primary) hypertension: Secondary | ICD-10-CM | POA: Diagnosis not present

## 2020-01-12 DIAGNOSIS — Z0001 Encounter for general adult medical examination with abnormal findings: Secondary | ICD-10-CM | POA: Diagnosis not present

## 2020-01-12 DIAGNOSIS — E7800 Pure hypercholesterolemia, unspecified: Secondary | ICD-10-CM | POA: Diagnosis not present

## 2020-01-12 DIAGNOSIS — Z79899 Other long term (current) drug therapy: Secondary | ICD-10-CM | POA: Diagnosis not present

## 2020-01-12 DIAGNOSIS — E440 Moderate protein-calorie malnutrition: Secondary | ICD-10-CM | POA: Diagnosis not present

## 2020-01-12 DIAGNOSIS — J439 Emphysema, unspecified: Secondary | ICD-10-CM | POA: Diagnosis not present

## 2020-01-14 DIAGNOSIS — C44622 Squamous cell carcinoma of skin of right upper limb, including shoulder: Secondary | ICD-10-CM | POA: Diagnosis not present

## 2020-01-16 ENCOUNTER — Other Ambulatory Visit: Payer: Self-pay

## 2020-01-16 ENCOUNTER — Ambulatory Visit: Payer: Medicare Other | Attending: Internal Medicine

## 2020-01-16 DIAGNOSIS — Z23 Encounter for immunization: Secondary | ICD-10-CM

## 2020-01-16 NOTE — Progress Notes (Signed)
   Covid-19 Vaccination Clinic  Name:  Andrew [name]    MRN: [id number] DOB: [birth year]  01/16/2020  Andrew [name] was observed post Covid-19 immunization for 15 minutes without incidence. He was provided with Vaccine Information Sheet and instruction to access the V-Safe system.   Andrew [name] was instructed to call 911 with any severe reactions post vaccine: Marland Kitchen Difficulty breathing  . Swelling of your face and throat  . A fast heartbeat  . A bad rash all over your body  . Dizziness and weakness    Immunizations Administered    Name Date Dose VIS Date Route   Pfizer COVID-19 Vaccine 01/16/2020  9:38 AM 0.3 mL 10/29/2019 Intramuscular   Manufacturer: [place]   Lot: [id number]   [place]: [id number]

## 2020-01-18 ENCOUNTER — Other Ambulatory Visit: Payer: Self-pay | Admitting: *Deleted

## 2020-01-18 MED ORDER — APIXABAN 5 MG PO TABS: 5.0000 mg | ORAL_TABLET | Freq: Two times a day (BID) | ORAL | 3 refills | Status: DC

## 2020-01-18 NOTE — Telephone Encounter (Signed)
Spoke with pt, Follow up scheduled and Refill sent to the pharmacy electronically.

## 2020-01-19 ENCOUNTER — Telehealth: Payer: Self-pay | Admitting: Pulmonary Disease

## 2020-01-19 NOTE — Telephone Encounter (Signed)
Dr. Valeta Harms,  This patient was last seen by you 12/22/19 for End stage COPD and centrilobular emphysema. He was issued a prescription for morphine 15 mg on 12/22/19. He was directed to take half a tablet every 12 hours as needed.  Based on that and the office note from 12/22/19 can a refill be issued? If so the patient would like it to go the [facility] in [place].

## 2020-01-20 MED ORDER — MORPHINE SULFATE 15 MG PO TABS: 7.5000 mg | ORAL_TABLET | Freq: Two times a day (BID) | ORAL | 0 refills | Status: DC | PRN

## 2020-01-20 NOTE — Telephone Encounter (Signed)
PCCM: Refill completed. Garner Nash, DO [facility] 01/20/2020 4:28 PM

## 2020-01-27 ENCOUNTER — Telehealth: Payer: Self-pay | Admitting: *Deleted

## 2020-01-27 NOTE — Telephone Encounter (Signed)
Called and left a voicemail for patient to arrange a Palliative care home visit. Left contact information for return call.

## 2020-01-31 ENCOUNTER — Telehealth: Payer: Self-pay | Admitting: *Deleted

## 2020-01-31 NOTE — Telephone Encounter (Signed)
Received a voicemail from patient returning my call regarding scheduling a Palliative care home visit. Called patient and visit is scheduled for 02/02/20 at 1:00pm.

## 2020-02-02 ENCOUNTER — Other Ambulatory Visit: Payer: Self-pay

## 2020-02-02 ENCOUNTER — Other Ambulatory Visit: Payer: Medicare Other | Admitting: *Deleted

## 2020-02-02 DIAGNOSIS — Z515 Encounter for palliative care: Secondary | ICD-10-CM

## 2020-02-07 NOTE — Progress Notes (Signed)
COMMUNITY PALLIATIVE CARE RN NOTE  PATIENT NAME: Andrew [name] DOB: 08/24/1941 MRN: [id number]  PRIMARY CARE PROVIDER: Lujean Amel, MD  RESPONSIBLE PARTY:  Acct ID - Guarantor Home Phone Work Phone Relationship Acct Type  [id number] CLEAVE, [phone]  Self P/F     [address] [place],  [address]   Covid-19 Pre-screening Negative  PLAN OF CARE and INTERVENTION:  1. ADVANCE CARE PLANNING/GOALS OF CARE: Goal is for patient to remain at home and avoid hospitalizations. He has a DNR. 2. PATIENT/CAREGIVER EDUCATION: Symptom management, safe mobility, dyspnea management, symptom management 3. DISEASE STATUS: Met with patient in his home. Patient answered the door. He is alert and oriented x 3, pleasant mood and conversational. He reports mild pain in his abdomen. He is currently taking Morphine twice daily. This medication does help with his breathing, but he feels that this causes his abdominal pain to be worse. However, he chooses to continue because the benefits outweigh this side effect. He does have slight abdominal distention. He says that lying on his side helps alleviate his abdominal pain and helps him breathe better as well. Since my visit last month, he has increased his oxygen from 2L/min to 3L/min via . He does experience dyspnea with exertion e.g bathing, dressing and ambulating short distances. He recovers at rest within 3-5 minutes. He does tire easily. He is ambulatory and does not utilize assistive devices. He remains able to perform ADLs independently. He is able to drive short distances. His appetite is good. He denies dysphagia. He is having regular BMs most of the time, with taking Miralax daily. He takes Senna on a PRN basis. He is continent of both bowel and bladder. He has receive his 1st Covid vaccine without any difficulties. Will continue to monitor.   HISTORY OF PRESENT ILLNESS:  This is a 79 yo male who lives with his daughter and family. Palliative care  continues to follow patient and visits monthly and PRN.  CODE STATUS: DNR ADVANCED DIRECTIVES: Y MOST FORM: yes PPS: 60%   PHYSICAL EXAM:   VITALS: Today's Vitals   02/02/20 1335  BP: 129/73  Pulse: 67  Resp: 20  Temp: 97.9 F (36.6 C)  TempSrc: Temporal  SpO2: 93%  PainSc: 2   PainLoc: Abdomen    LUNGS: clear to auscultation  CARDIAC: Cor RRR EXTREMITIES: No edema SKIN: Exposed skin is dry and intact  NEURO: Alert and oriented x 3, pleasant mood, generalized weakness, ambulatory   (Duration of visit and documentation 60 minutes)   Daryl Eastern, RN BSN

## 2020-02-09 ENCOUNTER — Telehealth: Payer: Self-pay | Admitting: Pulmonary Disease

## 2020-02-09 MED ORDER — MORPHINE SULFATE 15 MG PO TABS: 7.5000 mg | ORAL_TABLET | Freq: Two times a day (BID) | ORAL | 0 refills | Status: AC | PRN

## 2020-02-09 NOTE — Telephone Encounter (Signed)
Called and spoke to rep with [facility]. Per pt's chart his prednisone was sent to local pharmacy, they have placed the prednisone on hold for when pt needs it. Called and spoke to pt. Pt did confirm he is getting the pred from the local pharmacy but will want it changed to [facility] when he is out, pt aware to call us when he needs this. Pt also states he needs his Morphine refilled, he still has a few days left but would like this sent to [facility] as soon as possible so he does not have to go without.    Dr. Valeta Harms please advise on pt's Morphine refill. Thanks.

## 2020-02-09 NOTE — Telephone Encounter (Signed)
PCCM:  Morphine refilled.  Please have appt with BM to discuss symptom management via phone next week   Is outpatient palliative still seeing him?   [facility] 02/09/2020 12:49 PM

## 2020-02-10 ENCOUNTER — Encounter: Payer: Self-pay | Admitting: Pulmonary Disease

## 2020-02-10 ENCOUNTER — Ambulatory Visit (INDEPENDENT_AMBULATORY_CARE_PROVIDER_SITE_OTHER): Payer: Medicare Other | Admitting: Pulmonary Disease

## 2020-02-10 DIAGNOSIS — Z7901 Long term (current) use of anticoagulants: Secondary | ICD-10-CM | POA: Diagnosis not present

## 2020-02-10 DIAGNOSIS — Z9989 Dependence on other enabling machines and devices: Secondary | ICD-10-CM

## 2020-02-10 DIAGNOSIS — I272 Pulmonary hypertension, unspecified: Secondary | ICD-10-CM

## 2020-02-10 DIAGNOSIS — J9611 Chronic respiratory failure with hypoxia: Secondary | ICD-10-CM | POA: Diagnosis not present

## 2020-02-10 DIAGNOSIS — Z7952 Long term (current) use of systemic steroids: Secondary | ICD-10-CM

## 2020-02-10 DIAGNOSIS — G4733 Obstructive sleep apnea (adult) (pediatric): Secondary | ICD-10-CM

## 2020-02-10 DIAGNOSIS — J449 Chronic obstructive pulmonary disease, unspecified: Secondary | ICD-10-CM

## 2020-02-10 DIAGNOSIS — R140 Abdominal distension (gaseous): Secondary | ICD-10-CM | POA: Diagnosis not present

## 2020-02-10 NOTE — Assessment & Plan Note (Signed)
Plan: Continue 10 mg of prednisone

## 2020-02-10 NOTE — Assessment & Plan Note (Signed)
Plan: Continue oxygen therapy as prescribed Notify our office if oxygen levels are below 88%

## 2020-02-10 NOTE — Telephone Encounter (Signed)
Spoke with Wyn Quaker, NP. Per Aaron Edelman pt needs to consult with PCP regarding his abdominal pain but to keep televisit scheduled this afternoon. Called and spoke to pt. Informed him to contact PCP prior to telephone visit with Aaron Edelman this afternoon. Pt verbalized understanding.

## 2020-02-10 NOTE — Telephone Encounter (Addendum)
Called and spoke to pt. Pt states palliative is still seeing him, about once a month. They have come out to pt's home twice so far. Pt also states since beginning the morphine he has had worsening abdominal pain. Pt states he has noticed distention. Pt takes miralax daily and has daily bowel movements. Pt states he feels this is worth mentioning because the discomfort has worsened. Appt scheduled with Aaron Edelman this afternoon because of pt's abdominal discomfort.   Will forward to [facility] and Dr. Valeta Harms as Juluis Rainier.

## 2020-02-10 NOTE — Telephone Encounter (Signed)
Thank you   Andrew [name]  

## 2020-02-10 NOTE — Assessment & Plan Note (Signed)
Plan: Continue CPAP 

## 2020-02-10 NOTE — Assessment & Plan Note (Signed)
Plan: Stop morphine today  Continue Brovana, Pulmicort, Yupelri, prednisone 10 mg daily, Monday Wednesday Friday azithromycin Okay to use duo nebs as needed Chest x-ray today reveals no acute abnormality Lab work ordered today Continue oxygen therapy as prescribed Continue to work with palliative care 1 week telephonic follow-up

## 2020-02-10 NOTE — Telephone Encounter (Signed)
Will include Cherina on this.   E or C,   Can you all call and schedule the pt with me next week sometime. Anyday that works for him works for me   Wyn Quaker FNP

## 2020-02-10 NOTE — Assessment & Plan Note (Signed)
Likely WHO group 3  Plan: Continue sildenafil Continue CPAP therapy

## 2020-02-10 NOTE — Patient Instructions (Addendum)
You were seen today by [name], NP  for:   Is a pleasure talking to you today over the phone I am sorry that you are having the symptoms.  Please stop your morphine.  Increase MiraLAX to twice daily.  We will have a 1 week follow-up to check on you and your symptoms.  Thank you for also following up with primary care regarding this.  If symptoms persist or worsen please present to the emergency room  Andrew [name]  1. Pulmonary hypertension ([place]) 2. Chronic hypoxemic respiratory failure (H[facility]) 3. OSA on CPAP 4. Stage 3 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 ([place]) 5. Current chronic use of systemic steroids  Continue all your current pulmonary medications at this time  Please stop taking morphine  Continue to work with palliative care  6. Chronic anticoagulation Continue Eliquis  7. Abdominal distention  Stop morphine today Increase MiraLAX to twice daily Continue senna If symptoms worsen please present to an emergency room for further evaluation   Follow Up:    Return in about 1 week (around 02/17/2020), or if symptoms worsen or fail to improve, for Follow up with Andrew [name].   Please do your part to reduce the spread of COVID-19:      Reduce your risk of any infection  and COVID19 by using the similar precautions used for avoiding the common cold or flu:  Marland Kitchen Wash your hands often with soap and warm water for at least 20 seconds.  If soap and water are not readily available, use an alcohol-based hand sanitizer with at least 60% alcohol.  . If coughing or sneezing, cover your mouth and nose by coughing or sneezing into the elbow areas of your shirt or coat, into a tissue or into your sleeve (not your hands). Langley Gauss A MASK when in public  . Avoid shaking hands with others and consider head nods or verbal greetings only. . Avoid touching your eyes, nose, or mouth with unwashed hands.  . Avoid close contact with people who are sick. . Avoid places or events with large numbers of  people in one location, like concerts or sporting events. . If you have some symptoms but not all symptoms, continue to monitor at home and seek medical attention if your symptoms worsen. . If you are having a medical emergency, call 911.   [place] / e-Visit: [email or web address]         [facility]: [facility]: [id number]                   [facility]: [id number]     It is flu season:   >>> Best ways to protect herself from the flu: Receive the yearly flu vaccine, practice good hand hygiene washing with soap and also using hand sanitizer when available, eat a nutritious meals, get adequate rest, hydrate appropriately   Please contact the office if your symptoms worsen or you have concerns that you are not improving.   Thank you for choosing [facility] for your healthcare, and for allowing [place] to partner with you on your healthcare journey. I am thankful to be able to provide care to you today.   Andrew [name]

## 2020-02-10 NOTE — Assessment & Plan Note (Signed)
Increased abdominal distention and pain, lower abdominal pressure Worsened with morphine Stool changes, stools are now pencil shaped Currently taking MiraLAX 1 time daily Taking senna Taking morphine for help with dyspnea, patient does not feel that this helps manage his dyspnea well  Plan: Stop taking morphine today Increase MiraLAX to twice daily Continue taking senna Hydrate well 1 week telephonic visit with our office for follow-up If symptoms worsen please present to the emergency room for further evaluation

## 2020-02-10 NOTE — Assessment & Plan Note (Signed)
Plan: Continue Eliquis 

## 2020-02-10 NOTE — Progress Notes (Signed)
Virtual Visit via Telephone Note  I connected with Andrew [name] on 02/10/20 at  2:30 PM EDT by telephone and verified that I am speaking with the correct person using two identifiers.  Location: Patient: Home Provider: [facility] - [id number], [place], [place], [place] [address]   I discussed the limitations, risks, security and privacy concerns of performing an evaluation and management service by telephone and the availability of in person appointments. I also discussed with the patient that there may be a patient responsible charge related to this service. The patient expressed understanding and agreed to proceed.  Patient consented to consult via telephone: Yes People present and their role in pt care: Pt     History of Present Illness:  79 year old male former smoker followed in our office for severe COPD and chronic hypoxic respiratory failure  PMH: Hypertension,  pulmonary hypertension, GERD, BPH, hyperlipidemia, protein calorie malnutrition Smoker/ Smoking History: Former Smoker  Maintenance: Pulmicort, Candiss Norse, 10 mg of prednisone daily, azithromycin Monday Wednesday Friday  He is followed by Dr. Valeta [name]   Chief complaint: Constipation   79 year old male former smoker followed in our office for severe COPD and chronic respiratory failure.  He has been started on morphine to help with symptomatic dyspnea.  He was referred to hospice.  Patient declined hospice services.  He is currently maintained with palliative.He continues to be maintained on Pulmicort, Garlon Hatchet, Yupelri as well as 10 mg daily prednisone and Monday Wednesday Friday azithromycin.  He is followed by Dr. Valeta [name].  Patient's had worsening abdominal pain and some distention.  He takes MiraLAX daily and has daily bowel movements.  He has noticed that this is worsened since starting the morphine.  He was scheduled for a televisit today for [place] to further evaluate this.  Patient reports that  since starting morphine he has had increased abdominal pain, distention.  He has noticed that he said changes in his stools now pencil shaped stools.  He is always struggled with constipation in the past but morphine seems to have worsened this.  Initially he was getting symptomatic relief by taking morphine 1/2 tablet twice daily as ordered.  He reports that he is no longer getting symptomatic relief from his dyspnea.  He does currently take MiraLAX once daily at night as well as senna which was recommended by palliative care.  Patient has contacted primary care but he has not heard back from them yet.   Observations/Objective:  11/18/2019-chest x-ray-no acute abnormality, chronic severe bullous emphysema  12/14/2018-CT chest without contrast- advanced emphysema, patchy mucoid impaction of bronchi, arthrosclerosis  01/07/2019-pulmonary function test- FVC 2.14 (59% predicted), postbronchodilator ratio 38, postbronchodilator FEV1 0.80 (31% predicted), no bronchodilator response, DLCO 24  09/10/2017-echocardiogram-LV ejection fraction 55 to 60%, PA peak pressure 33  01/21/2019- ECG:  QTC 421  04/19/2019-EKG- sinus rhythm, rate 75, QTc 422  Social History   Tobacco Use  Smoking Status Former Smoker  . Packs/day: 0.00  . Years: 0.00  . Pack years: 0.00  . Types: Cigarettes  . Quit date: 10/01/1984  . Years since quitting: 35.3  Smokeless Tobacco Never Used   Immunization History  Administered Date(s) Administered  . Fluad Quad(high Dose 65+) 07/20/2019  . Influenza, High Dose Seasonal PF 07/22/2016, 08/07/2017, 08/08/2018  . Influenza-Unspecified 09/19/2015  . PFIZER SARS-COV-2 Vaccination 01/16/2020  . PPD Test 02/15/2016  . Pneumococcal Conjugate-13 09/19/2015  . Pneumococcal Polysaccharide-23 12/20/2016  . 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  Plan: Continue sildenafil Continue CPAP  therapy   Chronic hypoxemic respiratory failure ([name]) Plan: Continue oxygen therapy as prescribed Notify our office if oxygen levels are below 88%   OSA on CPAP Plan: Continue CPAP  Stage 3 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 ([name]) Plan: Stop morphine today  Continue Brovana, Pulmicort, Yupelri, prednisone 10 mg daily, Monday Wednesday Friday azithromycin Okay to use duo nebs as needed Chest x-ray today reveals no acute abnormality Lab work ordered today Continue oxygen therapy as prescribed Continue to work with palliative care 1 week telephonic follow-up  Current chronic use of systemic steroids Plan: Continue 10 mg of prednisone  Chronic anticoagulation Plan: Continue Eliquis  Abdominal distention Increased abdominal distention and pain, lower abdominal pressure Worsened with morphine Stool changes, stools are now pencil shaped Currently taking MiraLAX 1 time daily Taking senna Taking morphine for help with dyspnea, patient does not feel that this helps manage his dyspnea well  Plan: Stop taking morphine today Increase MiraLAX to twice daily Continue taking senna Hydrate well 1 week telephonic visit with our office for follow-up If symptoms worsen please present to the emergency room for further evaluation   Follow Up Instructions:  Return in about 1 week (around 02/17/2020), or if symptoms worsen or fail to improve, for Follow up with Andrew Quaker FNP-C.   I discussed the assessment and treatment plan with the patient. The patient was provided an opportunity to ask questions and all were answered. The patient agreed with the plan and demonstrated an understanding of the instructions.   The patient was advised to call back or seek an in-person evaluation if the symptoms worsen or if the condition fails to improve as anticipated.  I provided 22 minutes of non-face-to-face time during this encounter.   [name], NP

## 2020-02-11 DIAGNOSIS — R1033 Periumbilical pain: Secondary | ICD-10-CM | POA: Diagnosis not present

## 2020-02-14 ENCOUNTER — Ambulatory Visit: Payer: Medicare Other | Admitting: Pulmonary Disease

## 2020-02-14 ENCOUNTER — Other Ambulatory Visit: Payer: Self-pay | Admitting: Cardiovascular Disease

## 2020-02-14 DIAGNOSIS — R1033 Periumbilical pain: Secondary | ICD-10-CM | POA: Diagnosis not present

## 2020-02-16 ENCOUNTER — Ambulatory Visit: Payer: Medicare Other | Attending: Internal Medicine

## 2020-02-16 DIAGNOSIS — Z23 Encounter for immunization: Secondary | ICD-10-CM

## 2020-02-16 NOTE — Progress Notes (Signed)
   Covid-19 Vaccination Clinic  Name:  Andrew [name]    MRN: [id number] DOB: 11-01-1941  02/16/2020  [name] was observed post Covid-19 immunization for 15 minutes without incident. He was provided with Vaccine Information Sheet and instruction to access the V-Safe system.   [name] was instructed to call 911 with any severe reactions post vaccine: Marland Kitchen Difficulty breathing  . Swelling of face and throat  . A fast heartbeat  . A bad rash all over body  . Dizziness and weakness   Immunizations Administered    Name Date Dose VIS Date Route   Pfizer COVID-19 Vaccine 02/16/2020 10:25 AM 0.3 mL 10/29/2019 Intramuscular   Manufacturer: [place]   Lot: [id number]   [place]: [id number]

## 2020-02-17 ENCOUNTER — Other Ambulatory Visit: Payer: Self-pay | Admitting: Family Medicine

## 2020-02-17 ENCOUNTER — Other Ambulatory Visit: Payer: Self-pay

## 2020-02-17 ENCOUNTER — Ambulatory Visit
Admission: RE | Admit: 2020-02-17 | Discharge: 2020-02-17 | Disposition: A | Discharge status: Home / Self Care | Payer: Medicare Other | Source: Ambulatory Visit | Attending: Family Medicine | Admitting: Family Medicine

## 2020-02-17 DIAGNOSIS — R1033 Periumbilical pain: Secondary | ICD-10-CM

## 2020-02-17 DIAGNOSIS — K5730 Diverticulosis of large intestine without perforation or abscess without bleeding: Secondary | ICD-10-CM | POA: Diagnosis not present

## 2020-02-17 MED ORDER — IOPAMIDOL (ISOVUE-300) INJECTION 61%
100.0000 mL | Freq: Once | INTRAVENOUS | Status: AC | PRN
  Administered 2020-02-17: 100 mL via INTRAVENOUS

## 2020-02-17 MED ORDER — APIXABAN 5 MG PO TABS: 5.0000 mg | ORAL_TABLET | Freq: Two times a day (BID) | ORAL | 3 refills | Status: DC

## 2020-02-21 ENCOUNTER — Telehealth: Payer: Self-pay | Admitting: Pulmonary Disease

## 2020-02-21 ENCOUNTER — Other Ambulatory Visit: Payer: Self-pay

## 2020-02-21 MED ORDER — APIXABAN 5 MG PO TABS: 5.0000 mg | ORAL_TABLET | Freq: Two times a day (BID) | ORAL | 1 refills | Status: DC

## 2020-02-21 NOTE — Telephone Encounter (Signed)
Has a constipation improved?  Patient did not feel much symptomatic improvement when he was on morphine?  Has he changed his stance regarding hospice?  This would likely be a lot easier for the patient if he was willing to work with them considering they could help adjust morphine dosing and closely monitor his symptoms?Andrew Quaker, FNP

## 2020-02-21 NOTE — Telephone Encounter (Signed)
Spoke with the pt.  He states that his constipation is unchanged.  He states that the morphine helped only with the symptom of his SOB. He states he had discussed hospice with Dr Valeta Harms and they decided that Palliative care was what he should go with since Dr Valeta Harms could not say that he had 6 months to live.  He states that he never refused hospice, it just got confused with Palliative care. If he is "truly due for hospice"  He will proceed with this.  Please advise on morphine, thanks!

## 2020-02-21 NOTE — Telephone Encounter (Signed)
02/21/2020  There has been some miscommunication regarding this.  I would love to help clarify this for the patient.  Per Dr. 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  Also on azithromycin Monday Wednesday Friday.  We had a long discussion today in the office about his goals of care.  At this point we have made decisions for referral to outpatient hospice.  We will continue his current respiratory medications to help him breathe as best as possible.  He unfortunately has having ongoing progressive symptoms and will likely pass from this disease within the next 6 months to a year therefore I think transition to consideration for hospice care is appropriate.  We will also start oral morphine to help with dyspnea 7.5 mg every 12 hours as needed.  A new prescription for this was sent to the [facility].  Referral was placed to hospice, [facility] with the diagnosis of end-stage COPD.  I will service as his hospice physician.    At that office visit patient was referred to hospice.  When hospice contacted the patient they reported the patient declined hospice services although he does qualify.  We can very easily rectify this.  We can refer the patient back to hospice.  This will allow the patient to have better management of his overall quality of life as well as symptoms of his dyspnea.  Yes I am okay with the patient resuming morphine at this time.  We will route this message to Margaretmary Eddy with hospice to see if she can coordinate follow-up with the patient to clarify any questions and  get the patient enrolled.  Will route to BI as FYI.   Wyn Quaker, FNP

## 2020-02-21 NOTE — Telephone Encounter (Signed)
Spoke with patient. He stated that he had completed the CT scan that was ordered his PCP on 02/17/20. The CT scan did not show any cause for his abdominal pain. PCP suggested that it may be a pulled muscle for him to rest. The pain has since then increased. The pain goes away when he is lying flat on his bed. The pain increases with movement or standing.   He wants to know if it will be safe for him to go back on the morphine.   Aaron Edelman, please advise since you had a televisit with him on 02/10/20. Thanks!

## 2020-02-22 NOTE — Telephone Encounter (Signed)
Spoke with Margaretmary Eddy with Hospice. Andrew [name] will coordinate getting pt enrolled.   Aaron Edelman

## 2020-02-22 NOTE — Telephone Encounter (Signed)
Called and spoke to pt. Informed him of the recs per Wyn Quaker, NP. Pt aware he can resume Morphine. He is also aware of Hospice reaching out to him, pt is agreeable. Advised pt to contact our office back if he does not hear from hospice. Will leave encounter open for follow up.

## 2020-02-22 NOTE — Telephone Encounter (Signed)
Thanks for speaking with him [facility] 02/22/2020 11:56 AM

## 2020-02-23 DIAGNOSIS — I272 Pulmonary hypertension, unspecified: Secondary | ICD-10-CM | POA: Diagnosis not present

## 2020-02-23 DIAGNOSIS — Z87891 Personal history of nicotine dependence: Secondary | ICD-10-CM | POA: Diagnosis not present

## 2020-02-23 DIAGNOSIS — K449 Diaphragmatic hernia without obstruction or gangrene: Secondary | ICD-10-CM | POA: Diagnosis not present

## 2020-02-23 DIAGNOSIS — J432 Centrilobular emphysema: Secondary | ICD-10-CM | POA: Diagnosis not present

## 2020-02-23 DIAGNOSIS — N400 Enlarged prostate without lower urinary tract symptoms: Secondary | ICD-10-CM | POA: Diagnosis not present

## 2020-02-23 DIAGNOSIS — N3281 Overactive bladder: Secondary | ICD-10-CM | POA: Diagnosis not present

## 2020-02-23 DIAGNOSIS — G4733 Obstructive sleep apnea (adult) (pediatric): Secondary | ICD-10-CM | POA: Diagnosis not present

## 2020-02-23 DIAGNOSIS — Z6822 Body mass index (BMI) 22.0-22.9, adult: Secondary | ICD-10-CM | POA: Diagnosis not present

## 2020-02-23 DIAGNOSIS — K219 Gastro-esophageal reflux disease without esophagitis: Secondary | ICD-10-CM | POA: Diagnosis not present

## 2020-02-23 DIAGNOSIS — I10 Essential (primary) hypertension: Secondary | ICD-10-CM | POA: Diagnosis not present

## 2020-02-23 DIAGNOSIS — Z9981 Dependence on supplemental oxygen: Secondary | ICD-10-CM | POA: Diagnosis not present

## 2020-02-23 DIAGNOSIS — J9611 Chronic respiratory failure with hypoxia: Secondary | ICD-10-CM | POA: Diagnosis not present

## 2020-02-23 DIAGNOSIS — L309 Dermatitis, unspecified: Secondary | ICD-10-CM | POA: Diagnosis not present

## 2020-02-23 DIAGNOSIS — E785 Hyperlipidemia, unspecified: Secondary | ICD-10-CM | POA: Diagnosis not present

## 2020-02-23 DIAGNOSIS — I4891 Unspecified atrial fibrillation: Secondary | ICD-10-CM | POA: Diagnosis not present

## 2020-02-23 DIAGNOSIS — D649 Anemia, unspecified: Secondary | ICD-10-CM | POA: Diagnosis not present

## 2020-02-23 NOTE — Progress Notes (Signed)
PCCM: thanks for speaking with him  [facility] 02/23/2020 11:44 AM

## 2020-02-24 DIAGNOSIS — G4733 Obstructive sleep apnea (adult) (pediatric): Secondary | ICD-10-CM | POA: Diagnosis not present

## 2020-02-24 DIAGNOSIS — I4891 Unspecified atrial fibrillation: Secondary | ICD-10-CM | POA: Diagnosis not present

## 2020-02-24 DIAGNOSIS — I10 Essential (primary) hypertension: Secondary | ICD-10-CM | POA: Diagnosis not present

## 2020-02-24 DIAGNOSIS — J9611 Chronic respiratory failure with hypoxia: Secondary | ICD-10-CM | POA: Diagnosis not present

## 2020-02-24 DIAGNOSIS — I272 Pulmonary hypertension, unspecified: Secondary | ICD-10-CM | POA: Diagnosis not present

## 2020-02-24 DIAGNOSIS — J432 Centrilobular emphysema: Secondary | ICD-10-CM | POA: Diagnosis not present

## 2020-02-25 DIAGNOSIS — I272 Pulmonary hypertension, unspecified: Secondary | ICD-10-CM | POA: Diagnosis not present

## 2020-02-25 DIAGNOSIS — I4891 Unspecified atrial fibrillation: Secondary | ICD-10-CM | POA: Diagnosis not present

## 2020-02-25 DIAGNOSIS — I10 Essential (primary) hypertension: Secondary | ICD-10-CM | POA: Diagnosis not present

## 2020-02-25 DIAGNOSIS — J9611 Chronic respiratory failure with hypoxia: Secondary | ICD-10-CM | POA: Diagnosis not present

## 2020-02-25 DIAGNOSIS — G4733 Obstructive sleep apnea (adult) (pediatric): Secondary | ICD-10-CM | POA: Diagnosis not present

## 2020-02-25 DIAGNOSIS — J432 Centrilobular emphysema: Secondary | ICD-10-CM | POA: Diagnosis not present

## 2020-02-26 DIAGNOSIS — I10 Essential (primary) hypertension: Secondary | ICD-10-CM | POA: Diagnosis not present

## 2020-02-26 DIAGNOSIS — I272 Pulmonary hypertension, unspecified: Secondary | ICD-10-CM | POA: Diagnosis not present

## 2020-02-26 DIAGNOSIS — G4733 Obstructive sleep apnea (adult) (pediatric): Secondary | ICD-10-CM | POA: Diagnosis not present

## 2020-02-26 DIAGNOSIS — I4891 Unspecified atrial fibrillation: Secondary | ICD-10-CM | POA: Diagnosis not present

## 2020-02-26 DIAGNOSIS — J9611 Chronic respiratory failure with hypoxia: Secondary | ICD-10-CM | POA: Diagnosis not present

## 2020-02-26 DIAGNOSIS — J432 Centrilobular emphysema: Secondary | ICD-10-CM | POA: Diagnosis not present

## 2020-02-28 DIAGNOSIS — J9611 Chronic respiratory failure with hypoxia: Secondary | ICD-10-CM | POA: Diagnosis not present

## 2020-02-28 DIAGNOSIS — J432 Centrilobular emphysema: Secondary | ICD-10-CM | POA: Diagnosis not present

## 2020-02-28 DIAGNOSIS — G4733 Obstructive sleep apnea (adult) (pediatric): Secondary | ICD-10-CM | POA: Diagnosis not present

## 2020-02-28 DIAGNOSIS — I10 Essential (primary) hypertension: Secondary | ICD-10-CM | POA: Diagnosis not present

## 2020-02-28 DIAGNOSIS — I272 Pulmonary hypertension, unspecified: Secondary | ICD-10-CM | POA: Diagnosis not present

## 2020-02-28 DIAGNOSIS — I4891 Unspecified atrial fibrillation: Secondary | ICD-10-CM | POA: Diagnosis not present

## 2020-03-03 ENCOUNTER — Telehealth: Payer: Self-pay | Admitting: Pulmonary Disease

## 2020-03-03 DIAGNOSIS — I4891 Unspecified atrial fibrillation: Secondary | ICD-10-CM | POA: Diagnosis not present

## 2020-03-03 DIAGNOSIS — I10 Essential (primary) hypertension: Secondary | ICD-10-CM | POA: Diagnosis not present

## 2020-03-03 DIAGNOSIS — J9611 Chronic respiratory failure with hypoxia: Secondary | ICD-10-CM | POA: Diagnosis not present

## 2020-03-03 DIAGNOSIS — G4733 Obstructive sleep apnea (adult) (pediatric): Secondary | ICD-10-CM | POA: Diagnosis not present

## 2020-03-03 DIAGNOSIS — J432 Centrilobular emphysema: Secondary | ICD-10-CM | POA: Diagnosis not present

## 2020-03-03 DIAGNOSIS — I272 Pulmonary hypertension, unspecified: Secondary | ICD-10-CM | POA: Diagnosis not present

## 2020-03-03 NOTE — Telephone Encounter (Signed)
Switching to nebs are just fine.  Stopping his revatio will cause a significant increase in his symptoms.  Can he not continue this and purchase it from the pharmacy? Why would we be stopping a medication that improves his symptoms and that he has been stable on for years?  Garner Nash, DO [facility] 03/03/2020 5:47 PM

## 2020-03-03 NOTE — Telephone Encounter (Signed)
Spoke with Andrew [name], the pt is taking Revatio and wants to know if he can stop this medication because Hospice doesn't cover it or if there is something cheaper.. Also, the inhalers are not covered and they want to change them to nebulizer medication. He is already on duoneb and this is covered. Please advise.

## 2020-03-03 NOTE — Telephone Encounter (Signed)
Jule returning call.  [phone]

## 2020-03-03 NOTE — Telephone Encounter (Addendum)
I called Andrew [name], no answer. I left message for her to call back.

## 2020-03-06 ENCOUNTER — Ambulatory Visit: Payer: Medicare Other | Admitting: Cardiovascular Disease

## 2020-03-06 DIAGNOSIS — J9611 Chronic respiratory failure with hypoxia: Secondary | ICD-10-CM | POA: Diagnosis not present

## 2020-03-06 DIAGNOSIS — G4733 Obstructive sleep apnea (adult) (pediatric): Secondary | ICD-10-CM | POA: Diagnosis not present

## 2020-03-06 DIAGNOSIS — J432 Centrilobular emphysema: Secondary | ICD-10-CM | POA: Diagnosis not present

## 2020-03-06 DIAGNOSIS — I272 Pulmonary hypertension, unspecified: Secondary | ICD-10-CM | POA: Diagnosis not present

## 2020-03-06 DIAGNOSIS — I10 Essential (primary) hypertension: Secondary | ICD-10-CM | POA: Diagnosis not present

## 2020-03-06 DIAGNOSIS — I4891 Unspecified atrial fibrillation: Secondary | ICD-10-CM | POA: Diagnosis not present

## 2020-03-06 NOTE — Telephone Encounter (Signed)
Spoke with Andrew [name] and gave her BI recommendations. She understood and will have pt purchase Revatio out of pocket since they do not cover this medication. FYI BI

## 2020-03-06 NOTE — Telephone Encounter (Signed)
Ok thanks  BLI

## 2020-03-13 ENCOUNTER — Telehealth: Payer: Self-pay | Admitting: Pulmonary Disease

## 2020-03-13 DIAGNOSIS — J9611 Chronic respiratory failure with hypoxia: Secondary | ICD-10-CM | POA: Diagnosis not present

## 2020-03-13 DIAGNOSIS — G4733 Obstructive sleep apnea (adult) (pediatric): Secondary | ICD-10-CM | POA: Diagnosis not present

## 2020-03-13 DIAGNOSIS — J432 Centrilobular emphysema: Secondary | ICD-10-CM | POA: Diagnosis not present

## 2020-03-13 DIAGNOSIS — I4891 Unspecified atrial fibrillation: Secondary | ICD-10-CM | POA: Diagnosis not present

## 2020-03-13 DIAGNOSIS — I272 Pulmonary hypertension, unspecified: Secondary | ICD-10-CM | POA: Diagnosis not present

## 2020-03-13 DIAGNOSIS — I10 Essential (primary) hypertension: Secondary | ICD-10-CM | POA: Diagnosis not present

## 2020-03-13 NOTE — Telephone Encounter (Signed)
Called and spoke with Almyra Free from [facility], she states Dr. Valeta Harms had mentioned changing to duoneb, this medication is not on his medication list.   Dr. Valeta Harms may we order duonebs for this patient and how often would you like him to take it?  Please advise.

## 2020-03-14 NOTE — Telephone Encounter (Signed)
Yes please Q6H PRN BLI   Garner Nash, DO [facility] 03/14/2020 4:50 PM

## 2020-03-15 MED ORDER — IPRATROPIUM-ALBUTEROL 0.5-2.5 (3) MG/3ML IN SOLN: 3.0000 mL | Freq: Four times a day (QID) | RESPIRATORY_TRACT | 3 refills | Status: DC | PRN

## 2020-03-15 NOTE — Telephone Encounter (Signed)
Duoneb medication sent to [facility] in [place]. Patient notified nothing further needed at this time.

## 2020-03-18 DIAGNOSIS — N400 Enlarged prostate without lower urinary tract symptoms: Secondary | ICD-10-CM | POA: Diagnosis not present

## 2020-03-18 DIAGNOSIS — G4733 Obstructive sleep apnea (adult) (pediatric): Secondary | ICD-10-CM | POA: Diagnosis not present

## 2020-03-18 DIAGNOSIS — J432 Centrilobular emphysema: Secondary | ICD-10-CM | POA: Diagnosis not present

## 2020-03-18 DIAGNOSIS — N3281 Overactive bladder: Secondary | ICD-10-CM | POA: Diagnosis not present

## 2020-03-18 DIAGNOSIS — K449 Diaphragmatic hernia without obstruction or gangrene: Secondary | ICD-10-CM | POA: Diagnosis not present

## 2020-03-18 DIAGNOSIS — I4891 Unspecified atrial fibrillation: Secondary | ICD-10-CM | POA: Diagnosis not present

## 2020-03-18 DIAGNOSIS — I272 Pulmonary hypertension, unspecified: Secondary | ICD-10-CM | POA: Diagnosis not present

## 2020-03-18 DIAGNOSIS — Z6822 Body mass index (BMI) 22.0-22.9, adult: Secondary | ICD-10-CM | POA: Diagnosis not present

## 2020-03-18 DIAGNOSIS — K219 Gastro-esophageal reflux disease without esophagitis: Secondary | ICD-10-CM | POA: Diagnosis not present

## 2020-03-18 DIAGNOSIS — L309 Dermatitis, unspecified: Secondary | ICD-10-CM | POA: Diagnosis not present

## 2020-03-18 DIAGNOSIS — D649 Anemia, unspecified: Secondary | ICD-10-CM | POA: Diagnosis not present

## 2020-03-18 DIAGNOSIS — I10 Essential (primary) hypertension: Secondary | ICD-10-CM | POA: Diagnosis not present

## 2020-03-18 DIAGNOSIS — E785 Hyperlipidemia, unspecified: Secondary | ICD-10-CM | POA: Diagnosis not present

## 2020-03-18 DIAGNOSIS — Z9981 Dependence on supplemental oxygen: Secondary | ICD-10-CM | POA: Diagnosis not present

## 2020-03-18 DIAGNOSIS — Z87891 Personal history of nicotine dependence: Secondary | ICD-10-CM | POA: Diagnosis not present

## 2020-03-18 DIAGNOSIS — J9611 Chronic respiratory failure with hypoxia: Secondary | ICD-10-CM | POA: Diagnosis not present

## 2020-03-21 ENCOUNTER — Telehealth: Payer: Self-pay | Admitting: Pulmonary Disease

## 2020-03-21 NOTE — Telephone Encounter (Signed)
Called and spoke with Almyra Free from Hospice to see if pt was completely out of the morphine rx or if he had some left as Dr. Valeta Harms is out of the office unavailable until 5/7. Almyra Free stated that pt was going to need a refill before 5/7 so she was going to have the hospice MD refill med for pt. Nothing further needed.

## 2020-03-27 ENCOUNTER — Telehealth: Payer: Self-pay | Admitting: Pulmonary Disease

## 2020-03-27 DIAGNOSIS — Z515 Encounter for palliative care: Secondary | ICD-10-CM | POA: Insufficient documentation

## 2020-03-27 NOTE — Telephone Encounter (Signed)
PCCM:  I attempted to send new prescription in for 15 mg morphine twice daily. The electronic prescription service would not allow me to put this in. We will have to work with IT to help fix this.  I am not sure what to do in the meantime. Will they take a printed prescription?  [facility] 03/27/2020 4:14 PM

## 2020-03-27 NOTE — Telephone Encounter (Signed)
ATC Andrew [name] with [facility]  Dr.Icard please advise Andrew [name] states patient is having shortness of breath and pain. Increased morphine to 15 mg twice a day. Needs refill for morphine. Pharmacy is [facility]. Andrew [name] phone number is [phone]

## 2020-03-28 NOTE — Telephone Encounter (Signed)
Called and spoke with [facility] [phone]. Letting her know that we tried to send RX in but was having issues. She asked if we could try again or if we were able call pharmacy and do verbal order. Pharmacy is [facility] number is [phone].  Informed her that I would send a message to Dr. Valeta Harms asking him and if we were still unsuccesful then we would have to print a script and someone would have to come and pick it up. She expressed understanding.  Dr. Valeta Harms please advise

## 2020-03-29 MED ORDER — MORPHINE SULFATE 15 MG PO TABS: 15.0000 mg | ORAL_TABLET | Freq: Two times a day (BID) | ORAL | 0 refills | Status: DC

## 2020-03-29 NOTE — Telephone Encounter (Signed)
I resubmitted online this morning.  It should have went through electronically Thanks Garner Nash, DO [facility] 03/29/2020 7:09 AM

## 2020-03-29 NOTE — Telephone Encounter (Signed)
Spoke with Almyra Free. She is aware that the RX has been resubmitted. Verbalized understanding. Nothing further needed at time of call.

## 2020-03-29 NOTE — Addendum Note (Signed)
Addended by: Garner Nash on: 03/29/2020 07:09 AM   Modules accepted: Orders

## 2020-04-04 ENCOUNTER — Telehealth: Payer: Self-pay | Admitting: Pulmonary Disease

## 2020-04-04 ENCOUNTER — Telehealth: Payer: Self-pay | Admitting: Cardiovascular Disease

## 2020-04-04 DIAGNOSIS — L570 Actinic keratosis: Secondary | ICD-10-CM | POA: Diagnosis not present

## 2020-04-04 DIAGNOSIS — C44622 Squamous cell carcinoma of skin of right upper limb, including shoulder: Secondary | ICD-10-CM | POA: Diagnosis not present

## 2020-04-04 DIAGNOSIS — D485 Neoplasm of uncertain behavior of skin: Secondary | ICD-10-CM | POA: Diagnosis not present

## 2020-04-04 NOTE — Telephone Encounter (Signed)
I am ok with stopping eliquis and using baby asa daily  Garner Nash, DO [facility] 04/04/2020 5:26 PM

## 2020-04-04 NOTE — Telephone Encounter (Signed)
Spoke with Almyra Free from [facility] who report for the past week pt has been complaining of SOB with exertion.  She also report since this weekend, pt has experienced bilateral lower extremity edema. She is unsure of pt's weight as she hasn't recently weighed pt.   Virtual appointment scheduled for tomorrow 5/19 with Doreene Adas, PA

## 2020-04-04 NOTE — Telephone Encounter (Signed)
See response from Dr. Valeta Harms regarding this matter.   Andrew [name]

## 2020-04-04 NOTE — Telephone Encounter (Signed)
Pt c/o swelling: STAT is pt has developed SOB within 24 hours  1) How much weight have you gained and in what time span? Unsure, last weight she has is 145  2) If swelling, where is the swelling located? Lower extremity. Feet and ankles  3) Are you currently taking a fluid pill? no  4) Are you currently SOB? No, at rest he is okay but when he is moving around he does get SOB. Has been going on for about a week.   5) Do you have a log of your daily weights (if so, list)? no  6) Have you gained 3 pounds in a day or 5 pounds in a week? unsure  7) Have you traveled recently? Went to [place] about a week and a half ago.

## 2020-04-04 NOTE — Telephone Encounter (Signed)
Per TP, hold pressure on nose. Stop Eliquis tonight. If bleeding does not stop, then go to the ER.  Will wait for Aaron Edelman or Dr. Valeta Harms to return to office for them to review this to determine if it would be okay for pt to come off of the Eliquis and go on Aspirin.   Called and spoke with Almyra Free from [facility] stating to her the info from TP. Almyra Free verbalized understanding.  Routing this to both [facility] and Dr. Valeta Harms for recommendations about if pt will be okay to stop the Eliquis and go on Aspirin.

## 2020-04-05 ENCOUNTER — Telehealth (INDEPENDENT_AMBULATORY_CARE_PROVIDER_SITE_OTHER): Admitting: Physician Assistant

## 2020-04-05 ENCOUNTER — Encounter: Payer: Self-pay | Admitting: Physician Assistant

## 2020-04-05 VITALS — Ht 67.0 in | Wt 145.0 lb

## 2020-04-05 DIAGNOSIS — E785 Hyperlipidemia, unspecified: Secondary | ICD-10-CM | POA: Diagnosis not present

## 2020-04-05 DIAGNOSIS — Z7901 Long term (current) use of anticoagulants: Secondary | ICD-10-CM | POA: Diagnosis not present

## 2020-04-05 DIAGNOSIS — I480 Paroxysmal atrial fibrillation: Secondary | ICD-10-CM | POA: Diagnosis not present

## 2020-04-05 DIAGNOSIS — M7989 Other specified soft tissue disorders: Secondary | ICD-10-CM | POA: Diagnosis not present

## 2020-04-05 MED ORDER — POTASSIUM CHLORIDE ER 20 MEQ PO TBCR: 20.0000 meq | EXTENDED_RELEASE_TABLET | Freq: Every day | ORAL | 6 refills | Status: DC

## 2020-04-05 MED ORDER — FUROSEMIDE 40 MG PO TABS: 40.0000 mg | ORAL_TABLET | Freq: Every day | ORAL | 6 refills | Status: DC

## 2020-04-05 NOTE — Telephone Encounter (Signed)
Attempted to call Almyra Free with [facility] but unable to reach. Left message for her to return call.

## 2020-04-05 NOTE — Progress Notes (Signed)
Virtual Visit via Telephone Note   This visit type was conducted due to national recommendations for restrictions regarding the COVID-19 Pandemic (e.g. social distancing) in an effort to limit this patient's exposure and mitigate transmission in our community.  Due to his co-morbid illnesses, this patient is at least at moderate risk for complications without adequate follow up.  This format is felt to be most appropriate for this patient at this time.  The patient did not have access to video technology/had technical difficulties with video requiring transitioning to audio format only (telephone).  All issues noted in this document were discussed and addressed.  No physical exam could be performed with this format.  Please refer to the patient's chart for his  consent to telehealth for [facility].   The patient was identified using 2 identifiers.  Date:  04/05/2020   ID:  Andrew [name], DOB 1941/11/12, MRN [id number]  Patient Location: Home Provider Location: Home  PCP:  Lujean Amel, MD  Cardiologist:  Skeet Latch, MD  Electrophysiologist:  None   Evaluation Performed:  Follow-Up Visit  Chief Complaint:  Lower extremity edema, PAF, COPD, Pulm HTN, DOE  History of Present Illness:    Andrew [name] is a 79 y.o. male with a history of COPD on home O2, pulmonary hypertension from COPD, and PAF. He has been anticoagulated with eliquis and was on cardizem and BB for PAF. He was last seen by [name] 09/22/19 and was doing well. Noted that he was unaware of his Afib. He was in sinus rhythm at that visit. Last echo 09/10/17 showed normal EF and elevated PA peak pressure of 33 mmHg.   [name] has been receiving Palliative medicine in his home for end stage COPD since Feb 2021 with the goal of avoiding hospitalizations.  It appears Dr. Valeta Harms has been managing breathing treatment regimen and morphine, which has helped his DOE. Per notes, he was switched from eliquis to Andrew. He  is treated with chronic steroid therapy. He is sedentary at home with significant dyspnea with ADLs.  He presents today for follow up. He is now in Hospice care.  He questions his stroke risk now that Hospice doesn't cover eliquis. We discussed CHADS2VASC score, last EKG and holter. He is taking a 81 mg Andrew. He also discusses new lower extremity edema. Hospice nurse said his "heart sounds muffled which indicates CHF." He is using compression socks for 4-5 days with some improvement. He is at his baseline in terms of breathing.  He denies orthopnea.    The patient does not have symptoms concerning for COVID-19 infection (fever, chills, cough, or new shortness of breath).    Past Medical History:  Diagnosis Date  . Anemia   . BiPAP (biphasic positive airway pressure) dependence    Pt denies history of OSA  . BPH (benign prostatic hyperplasia)   . Cancer ([place])    skin  . COPD (chronic obstructive pulmonary disease) ([place])   . Dysphagia   . Emphysema lung ([place])   . GERD (gastroesophageal reflux disease)    " Silent reflux"  . Hearing loss    right ear  . History of hiatal hernia   . HLD (hyperlipidemia)   . Hypertension   . Oxygen dependent    2-3 liters  . Oxygen dependent   . Pneumonia   . Pulmonary hypertension ([place])   . Sleep apnea    wears cpap   Past Surgical History:  Procedure Laterality Date  .  CARDIAC CATHETERIZATION    . CATARACT EXTRACTION W/ INTRAOCULAR LENS  IMPLANT, BILATERAL    . COLONOSCOPY WITH PROPOFOL N/A 04/20/2018   Procedure: COLONOSCOPY WITH PROPOFOL;  Surgeon: Wilford Corner, MD;  Location: [facility];  Service: Endoscopy;  Laterality: N/A;  . ESOPHAGOGASTRODUODENOSCOPY (EGD) WITH PROPOFOL N/A 08/23/2016   Procedure: ESOPHAGOGASTRODUODENOSCOPY (EGD) WITH PROPOFOL;  Surgeon: Wilford Corner, MD;  [facility] ENDOSCOPY;  Service: Endoscopy;  Laterality: N/A;  . ESOPHAGOGASTRODUODENOSCOPY (EGD) WITH PROPOFOL N/A 04/20/2018   Procedure:  ESOPHAGOGASTRODUODENOSCOPY (EGD) WITH PROPOFOL;  Surgeon: Wilford Corner, MD;  Location: [facility];  Service: Endoscopy;  Laterality: N/A;  . HOT HEMOSTASIS N/A 04/20/2018   Procedure: HOT HEMOSTASIS (ARGON PLASMA COAGULATION/BICAP);  Surgeon: Wilford Corner, MD;  Location: [facility];  Service: Endoscopy;  Laterality: N/A;  . LUNG SURGERY    . POLYPECTOMY  04/20/2018   Procedure: POLYPECTOMY;  Surgeon: Wilford Corner, MD;  Location: [facility];  Service: Endoscopy;;  . TONSILLECTOMY       Current Meds  Medication Sig  . albuterol (PROVENTIL HFA;VENTOLIN HFA) 108 (90 Base) MCG/ACT inhaler Inhale 2 puffs into the lungs every 6 (six) hours as needed for wheezing or shortness of breath.  Marland Kitchen apixaban (ELIQUIS) 5 MG TABS tablet Take 1 tablet (5 mg total) by mouth 2 (two) times daily.  Marland Kitchen diltiazem (CARDIZEM CD) 180 MG 24 hr capsule TAKE 1 CAPSULE EVERY DAY (NEEDS OFFICE VISIT FOR FURTHER REFILLS)  . finasteride (PROSCAR) 5 MG tablet Take 5 mg by mouth at bedtime.   . fluticasone (FLONASE) 50 MCG/ACT nasal spray Place 1 spray into both nostrils at bedtime.   Marland Kitchen guaiFENesin (MUCINEX) 600 MG 12 hr tablet Take 1 tablet (600 mg total) by mouth 2 (two) times daily.  . hydrocortisone cream 1 % Apply 1 application topically daily as needed (Eczema on face and head).  Marland Kitchen ipratropium-albuterol (DUONEB) 0.5-2.5 (3) MG/3ML SOLN Take 3 mLs by nebulization every 6 (six) hours as needed.  Marland Kitchen morphine (MSIR) 15 MG tablet Take 1 tablet (15 mg total) by mouth in the morning and at bedtime.  . Multiple Vitamins-Minerals (MULTIVITAMIN WITH MINERALS) tablet Take 1 tablet by mouth daily.  Marland Kitchen omeprazole (PRILOSEC) 40 MG capsule Take 40 mg by mouth 2 (two) times daily.   . OXYGEN Inhale 2-3 L into the lungs See admin instructions. 2L daytime, 3L at night   . polyethylene glycol (MIRALAX / GLYCOLAX) packet Take 17 g by mouth daily. (Patient taking differently: Take 17 g by mouth at bedtime. Mix in 8 oz liquid and  drink)  . predniSONE (DELTASONE) 10 MG tablet TAKE 1 TABLET BY MOUTH DAILY WITH BREAKFAST  . rosuvastatin (CRESTOR) 10 MG tablet Take 10 mg by mouth daily.  . sildenafil (REVATIO) 20 MG tablet Take 1 tablet (20 mg total) by mouth 2 (two) times daily.  Marland Kitchen Spacer/Aero-Holding Chambers (AEROCHAMBER MV) inhaler Use as instructed  . tamsulosin (FLOMAX) 0.4 MG CAPS capsule Take 1 capsule (0.4 mg total) by mouth daily after breakfast.  . temazepam (RESTORIL) 30 MG capsule Take 1 capsule (30 mg total) by mouth at bedtime.  . vitamin C (ASCORBIC ACID) 500 MG tablet Take 500 mg by mouth 2 (two) times daily.   Marland Kitchen VITAMIN D PO Take 1 tablet by mouth daily.     Allergies:   Patient has no known allergies.   Social History   Tobacco Use  . Smoking status: Former Smoker    Packs/day: 0.00    Years: 0.00    Pack  years: 0.00    Types: Cigarettes    Quit date: 10/01/1984    Years since quitting: 35.5  . Smokeless tobacco: Never Used  Substance Use Topics  . Alcohol use: No    Alcohol/week: 0.0 standard drinks  . Drug use: No     Family Hx: The patient's family history includes Bone cancer in his brother; Cancer in his maternal uncle; Congestive Heart Failure in his maternal grandfather; Pancreatitis in his mother; Stroke in his maternal grandfather.  ROS:   Please see the history of present illness.     All other systems reviewed and are negative.   Prior CV studies:   The following studies were reviewed today:  Holter monitory 02/2018: PACs and PVCs noted. 4 beats of paroxysmal atrial tachycardia   Echo 09/10/17: Study Conclusions   - Left ventricle: The cavity size was normal. Wall thickness was  normal. Systolic function was normal. The estimated ejection  fraction was in the range of 55% to 60%. Wall motion was normal;  there were no regional wall motion abnormalities. The study is  not technically sufficient to allow evaluation of LV diastolic  function.  - Pulmonary  arteries: PA peak pressure: 33 mm Hg (S).  - Pericardium, extracardiac: A trivial pericardial effusion was  identified.   Labs/Other Tests and Data Reviewed:    EKG:  No ECG reviewed.  Recent Labs: 11/18/2019: ALT 19; Brain Natriuretic Peptide 18; BUN 25; Creat 1.11; Hemoglobin 15.6; Platelets 238; Potassium 5.2; Sodium 142   Recent Lipid Panel No results found for: CHOL, TRIG, HDL, CHOLHDL, LDLCALC, LDLDIRECT  Wt Readings from Last 3 Encounters:  04/05/20 145 lb (65.8 kg)  12/22/19 144 lb 8 oz (65.5 kg)  11/18/19 142 lb (64.4 kg)     Objective:    Vital Signs:  Ht 5\' 7"  (1.702 m)   Wt 145 lb (65.8 kg)   BMI 22.71 kg/m    VITAL SIGNS:  reviewed GEN:  no acute distress RESPIRATORY:  respirations sound unlabored NEURO:  alert and oriented x 3, no obvious focal deficit PSYCH:  normal affect  ASSESSMENT & PLAN:    Lower extremity swelling - we discussed his last echo - I described lasix and potassium and the need for both - we could do a lower dose of lasix without potassium but he would rather do a good job of removing fluid - I prescribed 40 mg lasix and 20 mEq potassium --> its possible he doesn't need to stay on this regimen - follow up virtual visit in three weeks (also apparently not covered by Hospice) - we discussed sodium and trying to keep his feet elevated --> he is confined to his bedroom with a hospital bed - continue compression socks   PAF No longer on anticoagulation - sinus rhythm at last visit - he is unaware of his rhythm - hospice does not cover eliquis - he has started 81 mg Andrew - This patients CHA2DS2-VASc Score and unadjusted Ischemic Stroke Rate (% per year) is equal to 3.2 % stroke rate/year from a score of 3 (2age, HTN) - we discussed his stroke risk, but there isn't much we can do if Hospice won't cover eliquis, it would be cost prohibitive to pay out of pocket - he understands this - last EKG and holter reassuring   Hyperlipidemia -  still on crestor, but OK to stop with Hospice in place    COVID-19 Education: The signs and symptoms of COVID-19 were discussed with the patient  and how to seek care for testing (follow up with PCP or arrange E-visit).  The importance of social distancing was discussed today.  Time:   Today, I have spent 35 minutes with the patient with telehealth technology discussing the above problems.     Medication Adjustments/Labs and Tests Ordered: Current medicines are reviewed at length with the patient today.  Concerns regarding medicines are outlined above.   Tests Ordered: No orders of the defined types were placed in this encounter.   Medication Changes: Meds ordered this encounter  Medications  . furosemide (LASIX) 40 MG tablet    Sig: Take 1 tablet (40 mg total) by mouth daily.    Dispense:  30 tablet    Refill:  6  . potassium chloride 20 MEQ TBCR    Sig: Take 20 mEq by mouth daily.    Dispense:  30 tablet    Refill:  6    Follow Up:  Virtual Visit  in 3 week(s)  Signed, Ledora Bottcher, PA  04/05/2020 12:15 PM    [facility]

## 2020-04-05 NOTE — Patient Instructions (Signed)
Medication Instructions:  START furosemide (Lasix) 40 mg daily START potassium 20 meq daily  *If you need a refill on your cardiac medications before your next appointment, please call your pharmacy*  Follow-Up: At [facility], you and your health needs are our priority.  As part of our continuing mission to provide you with exceptional heart care, we have created designated Provider Care Teams.  These Care Teams include your primary Cardiologist (physician) and Advanced Practice Providers (APPs -  Physician Assistants and Nurse Practitioners) who all work together to provide you with the care you need, when you need it.  We recommend signing up for the patient portal called "MyChart".  Sign up information is provided on this After Visit Summary.  MyChart is used to connect with patients for Virtual Visits (Telemedicine).  Patients are able to view lab/test results, encounter notes, upcoming appointments, etc.  Non-urgent messages can be sent to your provider as well.   To learn more about what you can do with MyChart, go to [email or web address].    Your next appointment:    June 9th at 10:15 AM  The format for your next appointment:   Virtual Visit   Provider:   [name] PA

## 2020-04-06 NOTE — Telephone Encounter (Signed)
Called and spoke with Almyra Free letting her know that pt could stop the eliquis and start a baby aspirin daily. She verbalized understanding. Nothing further needed.

## 2020-04-06 NOTE — Telephone Encounter (Signed)
Andrew [name] is returning phone call. Andrew [name] phone number is [phone].

## 2020-04-11 ENCOUNTER — Telehealth: Payer: Self-pay | Admitting: Pulmonary Disease

## 2020-04-11 DIAGNOSIS — Z515 Encounter for palliative care: Secondary | ICD-10-CM

## 2020-04-11 NOTE — Telephone Encounter (Signed)
Okay I am sorry to hear this, I am not familiar with this patient let me discuss this with Wyn Quaker about adjustment of medications and sleep medications as well and we will get back to him if he has emergent issues please seek emergency room care

## 2020-04-11 NOTE — Telephone Encounter (Signed)
Called and spoke with Andrew [name] in regards to medication changes.   Tammy please advise. Patient usually see's Dr. Valeta Harms  Pt has been having more shortness of breath. Can hardly move around. Taking morphine 15 mg twice a day. Question increasing dosage during the day for shortness of breath. Also wanted something else for sleep other than tamazepam 30 mg. Please advise.

## 2020-04-12 MED ORDER — MORPHINE SULFATE 15 MG PO TABS: 15.0000 mg | ORAL_TABLET | ORAL | 0 refills | Status: AC

## 2020-04-12 NOTE — Telephone Encounter (Signed)
Spoke with Almyra Free. She stated that the patient is currently on morphine 15mg  twice daily for SOB. She is wanting to get the RX changed to morphine 15mg  every 4 hours for SOB if possible.   She also wants a suggestion on something else besides temazpam for his sleep. She doesn't have any suggestions on this and wants our suggestions on options.

## 2020-04-12 NOTE — Telephone Encounter (Signed)
Yes ok to prescribe whats requested. Can you pend to me?  Aaron Edelman

## 2020-04-12 NOTE — Telephone Encounter (Signed)
No new recommendations for Almyra Free.  Almyra Free can investigate with a hospice clinical team to see what their suggestions may be.Okay to change the morphine prescription over.  Feel free to pend that to me to every 4 hours.Wyn Quaker, FNP

## 2020-04-12 NOTE — Telephone Encounter (Signed)
Rx has been pended. Routing back to [place].

## 2020-04-12 NOTE — Telephone Encounter (Signed)
Pt is followed by Hospice. He needs to contact them regarding these symptoms.   Wyn Quaker FNP

## 2020-04-12 NOTE — Telephone Encounter (Signed)
The call that we received was from Hospice as they were the one who contacted [place] about pt's symptoms.

## 2020-04-12 NOTE — Telephone Encounter (Signed)
Called and spoke with Almyra Free letting her know the info stated by Aaron Edelman and also stated to her the other per Aaron Edelman. She verbalized understanding. Nothing further needed.

## 2020-04-12 NOTE — Telephone Encounter (Signed)
Signed and sent.  Wyn Quaker, FNP

## 2020-04-14 ENCOUNTER — Other Ambulatory Visit: Payer: Self-pay | Admitting: Adult Health

## 2020-04-15 ENCOUNTER — Other Ambulatory Visit: Payer: Self-pay

## 2020-04-15 ENCOUNTER — Encounter (HOSPITAL_COMMUNITY): Payer: Self-pay

## 2020-04-15 ENCOUNTER — Emergency Department (HOSPITAL_COMMUNITY)
Admission: EM | Admit: 2020-04-15 | Discharge: 2020-04-15 | Disposition: A | Discharge status: Home / Self Care | Attending: Emergency Medicine | Admitting: Emergency Medicine

## 2020-04-15 DIAGNOSIS — R52 Pain, unspecified: Secondary | ICD-10-CM

## 2020-04-15 DIAGNOSIS — Z7982 Long term (current) use of aspirin: Secondary | ICD-10-CM | POA: Diagnosis not present

## 2020-04-15 DIAGNOSIS — R0602 Shortness of breath: Secondary | ICD-10-CM | POA: Insufficient documentation

## 2020-04-15 DIAGNOSIS — R0902 Hypoxemia: Secondary | ICD-10-CM | POA: Diagnosis not present

## 2020-04-15 DIAGNOSIS — R109 Unspecified abdominal pain: Secondary | ICD-10-CM | POA: Insufficient documentation

## 2020-04-15 DIAGNOSIS — Z9981 Dependence on supplemental oxygen: Secondary | ICD-10-CM | POA: Insufficient documentation

## 2020-04-15 DIAGNOSIS — Z79899 Other long term (current) drug therapy: Secondary | ICD-10-CM | POA: Insufficient documentation

## 2020-04-15 DIAGNOSIS — I491 Atrial premature depolarization: Secondary | ICD-10-CM | POA: Diagnosis not present

## 2020-04-15 DIAGNOSIS — R000 Tachycardia, unspecified: Secondary | ICD-10-CM | POA: Diagnosis not present

## 2020-04-15 DIAGNOSIS — I10 Essential (primary) hypertension: Secondary | ICD-10-CM | POA: Insufficient documentation

## 2020-04-15 DIAGNOSIS — Z85828 Personal history of other malignant neoplasm of skin: Secondary | ICD-10-CM | POA: Diagnosis not present

## 2020-04-15 DIAGNOSIS — Z515 Encounter for palliative care: Secondary | ICD-10-CM | POA: Diagnosis not present

## 2020-04-15 DIAGNOSIS — J449 Chronic obstructive pulmonary disease, unspecified: Secondary | ICD-10-CM | POA: Diagnosis not present

## 2020-04-15 DIAGNOSIS — G8929 Other chronic pain: Secondary | ICD-10-CM | POA: Diagnosis not present

## 2020-04-15 DIAGNOSIS — I959 Hypotension, unspecified: Secondary | ICD-10-CM | POA: Diagnosis not present

## 2020-04-15 MED ORDER — MORPHINE SULFATE (PF) 2 MG/ML IV SOLN
2.0000 mg | Freq: Once | INTRAVENOUS | Status: AC
  Administered 2020-04-15: 2 mg via INTRAVENOUS
  Filled 2020-04-15: qty 1

## 2020-04-15 NOTE — ED Triage Notes (Addendum)
Pt arrives from home, BIB by [facility]. Hospice pt with DNR, called out for SOB ongoing for two weeks that worsened today. 10 albuterol, 1mg  Atrovent, 125 solumedrol, and 2 G mag en route. 20 R hand. Initial sats 84% on 3L, up to 97%. On hospice for COPD.  102/50, Hr 102, RR 20, 97%, temp 97.7. Pt has received both COVID vaccines.  Family would prefer pt be sent home if possible after stabilizing.

## 2020-04-15 NOTE — ED Notes (Signed)
[place].  [phone].

## 2020-04-15 NOTE — ED Provider Notes (Signed)
[facility] EMERGENCY DEPARTMENT Provider Note   CSN: [id number] Arrival date & time: 04/15/20  [id number]     History Chief Complaint  Patient presents with  . Shortness of Breath    Andrew [name] is a 79 y.o. male.  The history is provided by the patient, the EMS personnel and medical records. No language interpreter was used.  Shortness of Breath  Andrew [name] is a 79 y.o. male who presents to the Emergency Department complaining of shortness of breath. He presents the emergency department from home for evaluation of shortness of breath. Level V caveat due to confusion. He has a history of COPD and is on home oxygen at baseline. He is currently followed by home hospice. Per report he is in the emergency department for increasing shortness of breath. On evaluation patient states that he is here for pain. He understands that he does not have much more time in his life and he does not want to spend and pain. He complains of acute on chronic abdominal pain. He declines any evaluation in terms of testing. He just wants symptom management. He lives with his daughter. EMS treated him with 10 mg of albuterol, 1 mg of Atrovent, 125 Solu-Medrol and 2 g of magnesium.    Past Medical History:  Diagnosis Date  . Anemia   . BiPAP (biphasic positive airway pressure) dependence    Pt denies history of OSA  . BPH (benign prostatic hyperplasia)   . Cancer ([place])    skin  . COPD (chronic obstructive pulmonary disease) ([place])   . Dysphagia   . Emphysema lung ([place])   . GERD (gastroesophageal reflux disease)    " Silent reflux"  . Hearing loss    right ear  . History of hiatal hernia   . HLD (hyperlipidemia)   . Hypertension   . Oxygen dependent    2-3 liters  . Oxygen dependent   . Pneumonia   . Pulmonary hypertension ([place])   . Sleep apnea    wears cpap    Patient Active Problem List   Diagnosis Date Noted  . Hospice care patient 03/27/2020  . Palliative care patient  03/27/2020  . Abdominal distention 02/10/2020  . Chronic anticoagulation 09/22/2019  . Healthcare maintenance 07/20/2019  . Immunosuppressed status ([place]) 12/03/2018  . Current chronic use of systemic steroids 12/03/2018  . Iron deficiency anemia, unspecified 04/20/2018  . PVCs (premature ventricular contractions) 02/02/2018  . Pressure injury of skin 01/18/2018  . Influenza-like illness 01/17/2018  . History of pneumonia 12/16/2017  . OSA on CPAP 09/22/2017  . Paroxysmal atrial fibrillation ([facility])   . Malnutrition of moderate degree 09/10/2017  . Protein-calorie malnutrition, severe 12/09/2016  . Dysphagia 08/23/2016  . Normocytic anemia 06/28/2016  . Pressure sore on buttocks 06/28/2016  . Pulmonary hypertension ([place]) 02/20/2016  . GERD (gastroesophageal reflux disease) 02/20/2016  . COPD with acute exacerbation ([place]) 02/13/2016  . Stage 3 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 ([place]) 02/11/2016  . Hypertension 02/11/2016  . Hyperlipidemia 02/11/2016  . BPH (benign prostatic hyperplasia) 12/29/2015  . Chronic hypoxemic respiratory failure ([place]) 10/02/2015  . History of pulmonary hypertension 10/02/2015    Past Surgical History:  Procedure Laterality Date  . CARDIAC CATHETERIZATION    . CATARACT EXTRACTION W/ INTRAOCULAR LENS  IMPLANT, BILATERAL    . COLONOSCOPY WITH PROPOFOL N/A 04/20/2018   Procedure: COLONOSCOPY WITH PROPOFOL;  Surgeon: Wilford Corner, MD;  Location: [facility];  Service: Endoscopy;  Laterality: N/A;  . ESOPHAGOGASTRODUODENOSCOPY (EGD)  WITH PROPOFOL N/A 08/23/2016   Procedure: ESOPHAGOGASTRODUODENOSCOPY (EGD) WITH PROPOFOL;  Surgeon: Wilford Corner, MD;  Location: [facility] ENDOSCOPY;  Service: Endoscopy;  Laterality: N/A;  . ESOPHAGOGASTRODUODENOSCOPY (EGD) WITH PROPOFOL N/A 04/20/2018   Procedure: ESOPHAGOGASTRODUODENOSCOPY (EGD) WITH PROPOFOL;  Surgeon: Wilford Corner, MD;  Location: [facility];  Service: Endoscopy;  Laterality: N/A;  . HOT HEMOSTASIS N/A  04/20/2018   Procedure: HOT HEMOSTASIS (ARGON PLASMA COAGULATION/BICAP);  Surgeon: Wilford Corner, MD;  Location: [facility];  Service: Endoscopy;  Laterality: N/A;  . LUNG SURGERY    . POLYPECTOMY  04/20/2018   Procedure: POLYPECTOMY;  Surgeon: Wilford Corner, MD;  Location: [facility] END[facility]COPY;  Service: Endoscopy;;  . TONSILLECTOMY         Family History  Problem Relation Age of Onset  . Cancer Maternal Uncle   . Pancreatitis Mother   . Bone cancer Brother   . Stroke Maternal Grandfather   . Congestive Heart Failure Maternal Grandfather     Social History   Tobacco Use  . Smoking status: Former Smoker    Packs/day: 0.00    Years: 0.00    Pack years: 0.00    Types: Cigarettes    Quit date: 10/01/1984    Years since quitting: 35.5  . Smokeless tobacco: Never Used  Substance Use Topics  . Alcohol use: No    Alcohol/week: 0.0 standard drinks  . Drug use: No    Home Medications Prior to Admission medications   Medication Sig Start Date End Date Taking? Authorizing Provider  albuterol (PROVENTIL HFA;VENTOLIN HFA) 108 (90 Base) MCG/ACT inhaler Inhale 2 puffs into the lungs every 6 (six) hours as needed for wheezing or shortness of breath. 04/17/17   Noralee Space, MD  aspirin EC 81 MG tablet Take 81 mg by mouth daily.    [provider]  BROVANA 15 MCG/2ML NEBU USE 1 VIAL  IN  NEBULIZER TWICE  DAILY - Morning and Evening 04/14/20   Parrett, Tammy S, NP  budesonide (PULMICORT) 0.5 MG/2ML nebulizer solution USE 1 VIAL  IN  NEBULIZER TWICE  DAILY - Rinse mouth after treatment 04/14/20   Parrett, Tammy S, NP  diltiazem (CARDIZEM CD) 180 MG 24 hr capsule TAKE 1 CAPSULE EVERY DAY (NEEDS OFFICE VISIT FOR FURTHER REFILLS) 02/15/20   Skeet Latch, MD  finasteride (PROSCAR) 5 MG tablet Take 5 mg by mouth at bedtime.     [provider]  fluticasone (FLONASE) 50 MCG/ACT nasal spray Place 1 spray into both nostrils at bedtime.  06/24/17   [provider]    furosemide (LASIX) 40 MG tablet Take 1 tablet (40 mg total) by mouth daily. 04/05/20 05/05/20  Duke, Tami Lin, PA  guaiFENesin (MUCINEX) 600 MG 12 hr tablet Take 1 tablet (600 mg total) by mouth 2 (two) times daily. 09/12/17   Lavina Hamman, MD  hydrocortisone cream 1 % Apply 1 application topically daily as needed (Eczema on face and head).    [provider]  ipratropium-albuterol (DUONEB) 0.5-2.5 (3) MG/3ML SOLN Take 3 mLs by nebulization every 6 (six) hours as needed. 03/15/20   Icard, Octavio Graves, DO  morphine (MSIR) 15 MG tablet Take 1 tablet (15 mg total) by mouth every 4 (four) hours. 04/12/20 05/12/20  [name], NP  Multiple Vitamins-Minerals (MULTIVITAMIN WITH MINERALS) tablet Take 1 tablet by mouth daily.    [provider]  MYRBETRIQ 25 MG TB24 tablet Take 25 mg by mouth daily. 12/09/19   [provider]  omeprazole (PRILOSEC) 40  MG capsule Take 40 mg by mouth 2 (two) times daily.     [provider]  OXYGEN Inhale 2-3 L into the lungs See admin instructions. 2L daytime, 3L at night     [provider]  polyethylene glycol (MIRALAX / GLYCOLAX) packet Take 17 g by mouth daily. Patient taking differently: Take 17 g by mouth at bedtime. Mix in 8 oz liquid and drink 05/08/16   Mesner, Corene Cornea, MD  potassium chloride 20 MEQ TBCR Take 20 mEq by mouth daily. 04/05/20   Duke, Tami Lin, PA  predniSONE (DELTASONE) 10 MG tablet TAKE 1 TABLET BY MOUTH DAILY WITH BREAKFAST 12/22/19   Icard, Bradley L, DO  rosuvastatin (CRESTOR) 10 MG tablet Take 10 mg by mouth daily.    [provider]  sildenafil (REVATIO) 20 MG tablet Take 1 tablet (20 mg total) by mouth 2 (two) times daily. 03/12/19   Fenton Foy, NP  Spacer/Aero-Holding Chambers (AEROCHAMBER MV) inhaler Use as instructed 12/03/18   Icard, Octavio Graves, DO  tamsulosin (FLOMAX) 0.4 MG CAPS capsule Take 1 capsule (0.4 mg total) by mouth daily after breakfast. 01/03/16   Nita Sells,  MD  temazepam (RESTORIL) 30 MG capsule Take 1 capsule (30 mg total) by mouth at bedtime. 07/09/18   Noralee Space, MD  vitamin C (ASCORBIC ACID) 500 MG tablet Take 500 mg by mouth 2 (two) times daily.     [provider]  VITAMIN D PO Take 1 tablet by mouth daily.    [provider]    Allergies    Patient has no known allergies.  Review of Systems   Review of Systems  Respiratory: Positive for shortness of breath.   All other systems reviewed and are negative.   Physical Exam Updated Vital Signs BP (!) 149/95   Pulse (!) 102   Temp 98.1 F (36.7 C) (Oral)   Resp (!) 22   Ht 5\' 7"  (1.702 m)   Wt 61.2 kg   SpO2 100%   BMI 21.14 kg/m   Physical Exam Vitals and nursing note reviewed.  Constitutional:      Appearance: He is well-developed.  HENT:     Head: Normocephalic and atraumatic.  Cardiovascular:     Rate and Rhythm: Normal rate and regular rhythm.     Heart sounds: No murmur.  Pulmonary:     Effort: Pulmonary effort is normal. No respiratory distress.     Comments: Diffuse wheezes. Decreased air movement bilaterally Abdominal:     Palpations: Abdomen is soft.     Tenderness: There is no guarding or rebound.     Comments: Mild generalized weakness.  Musculoskeletal:        General: No tenderness.  Skin:    General: Skin is warm and dry.  Neurological:     Mental Status: He is alert.     Comments: Mildly confused. Low-volume speech. Slurred speech at times that is difficult to understand.  Psychiatric:        Behavior: Behavior normal.     ED Results / Procedures / Treatments   Labs (all labs ordered are listed, but only abnormal results are displayed) Labs Reviewed - No data to display  EKG EKG Interpretation  Date/Time:  Saturday Apr 15 2020 08:46:29 EDT Ventricular Rate:  105 PR Interval:    QRS Duration: 124 QT Interval:  331 QTC Calculation: 429 R Axis:   47 Text Interpretation: Sinus arrhythmia Ventricular premature  complex Biatrial enlargement IVCD, consider atypical  RBBB Nonspecific T abnormalities, lateral leads Confirmed by Quintella Reichert [phone]) on 04/15/2020 8:48:06 AM   Radiology No results found.  Procedures Procedures (including critical care time)  Medications Ordered in ED Medications  morphine 2 MG/ML injection 2 mg (2 mg Intravenous Given 04/15/20 0910)    ED Course  I have reviewed the triage vital signs and the nursing notes.  Pertinent labs & imaging results that were available during my care of the patient were reviewed by me and considered in my medical decision making (see chart for details).    MDM Rules/Calculators/A&P                     Patient with end-stage COPD on home hospice here for evaluation of shortness of breath and increased abdominal pain. His shortness of breath significantly improved following treatment provided by EMS prior to ED arrival. He does have ongoing pain and was treated with IV morphine. On repeat assessment following morphine administration he states that his pain is significantly improved. He declines further workup for his symptoms and request to go back home with ongoing home hospice. Discussed with hospice nurse patients symptoms and treatment plan. Patient and family in agreement with plan.  Final Clinical Impression(s) / ED Diagnoses Final diagnoses:  Pain  Shortness of breath  Hospice care    Rx / DC Orders ED Discharge Orders    None       Quintella Reichert, MD 04/15/20 [phone]

## 2020-04-15 NOTE — ED Notes (Signed)
Got patient into a gown on the monitor did ekg shown to Dr Ralene Bathe patient is resting with call bell in reach

## 2020-04-15 NOTE — Discharge Instructions (Addendum)
Call [place] when you get home so they can evaluate you later today in the home.

## 2020-04-15 NOTE — Progress Notes (Signed)
[facility] Documentation  [place] triage nurse notified liaison that pt was transferred to [facility] ED for SOB. Liaison to follow pt during ED course and if he is admitted. If pt is dc'd home from ED, [facility] RN will follow up with pt at home as soon as pt is dc'd.   Please reach out with any questions.   Thank you,  Freddie Breech, RN [facility] Liaison [phone]

## 2020-04-18 DIAGNOSIS — J9611 Chronic respiratory failure with hypoxia: Secondary | ICD-10-CM | POA: Diagnosis not present

## 2020-04-18 DIAGNOSIS — Z6822 Body mass index (BMI) 22.0-22.9, adult: Secondary | ICD-10-CM | POA: Diagnosis not present

## 2020-04-18 DIAGNOSIS — I10 Essential (primary) hypertension: Secondary | ICD-10-CM | POA: Diagnosis not present

## 2020-04-18 DIAGNOSIS — K219 Gastro-esophageal reflux disease without esophagitis: Secondary | ICD-10-CM | POA: Diagnosis not present

## 2020-04-18 DIAGNOSIS — E785 Hyperlipidemia, unspecified: Secondary | ICD-10-CM | POA: Diagnosis not present

## 2020-04-18 DIAGNOSIS — D649 Anemia, unspecified: Secondary | ICD-10-CM | POA: Diagnosis not present

## 2020-04-18 DIAGNOSIS — G4733 Obstructive sleep apnea (adult) (pediatric): Secondary | ICD-10-CM | POA: Diagnosis not present

## 2020-04-18 DIAGNOSIS — K449 Diaphragmatic hernia without obstruction or gangrene: Secondary | ICD-10-CM | POA: Diagnosis not present

## 2020-04-18 DIAGNOSIS — Z9981 Dependence on supplemental oxygen: Secondary | ICD-10-CM | POA: Diagnosis not present

## 2020-04-18 DIAGNOSIS — J432 Centrilobular emphysema: Secondary | ICD-10-CM | POA: Diagnosis not present

## 2020-04-18 DIAGNOSIS — N400 Enlarged prostate without lower urinary tract symptoms: Secondary | ICD-10-CM | POA: Diagnosis not present

## 2020-04-18 DIAGNOSIS — I272 Pulmonary hypertension, unspecified: Secondary | ICD-10-CM | POA: Diagnosis not present

## 2020-04-18 DIAGNOSIS — I4891 Unspecified atrial fibrillation: Secondary | ICD-10-CM | POA: Diagnosis not present

## 2020-04-18 DIAGNOSIS — L309 Dermatitis, unspecified: Secondary | ICD-10-CM | POA: Diagnosis not present

## 2020-04-18 DIAGNOSIS — N3281 Overactive bladder: Secondary | ICD-10-CM | POA: Diagnosis not present

## 2020-04-18 DIAGNOSIS — Z87891 Personal history of nicotine dependence: Secondary | ICD-10-CM | POA: Diagnosis not present

## 2020-04-19 DIAGNOSIS — I272 Pulmonary hypertension, unspecified: Secondary | ICD-10-CM | POA: Diagnosis not present

## 2020-04-19 DIAGNOSIS — J9611 Chronic respiratory failure with hypoxia: Secondary | ICD-10-CM | POA: Diagnosis not present

## 2020-04-19 DIAGNOSIS — I10 Essential (primary) hypertension: Secondary | ICD-10-CM | POA: Diagnosis not present

## 2020-04-19 DIAGNOSIS — G4733 Obstructive sleep apnea (adult) (pediatric): Secondary | ICD-10-CM | POA: Diagnosis not present

## 2020-04-19 DIAGNOSIS — I4891 Unspecified atrial fibrillation: Secondary | ICD-10-CM | POA: Diagnosis not present

## 2020-04-19 DIAGNOSIS — J432 Centrilobular emphysema: Secondary | ICD-10-CM | POA: Diagnosis not present

## 2020-04-20 DIAGNOSIS — I10 Essential (primary) hypertension: Secondary | ICD-10-CM | POA: Diagnosis not present

## 2020-04-20 DIAGNOSIS — I272 Pulmonary hypertension, unspecified: Secondary | ICD-10-CM | POA: Diagnosis not present

## 2020-04-20 DIAGNOSIS — J432 Centrilobular emphysema: Secondary | ICD-10-CM | POA: Diagnosis not present

## 2020-04-20 DIAGNOSIS — G4733 Obstructive sleep apnea (adult) (pediatric): Secondary | ICD-10-CM | POA: Diagnosis not present

## 2020-04-20 DIAGNOSIS — J9611 Chronic respiratory failure with hypoxia: Secondary | ICD-10-CM | POA: Diagnosis not present

## 2020-04-20 DIAGNOSIS — I4891 Unspecified atrial fibrillation: Secondary | ICD-10-CM | POA: Diagnosis not present

## 2020-04-21 DIAGNOSIS — J432 Centrilobular emphysema: Secondary | ICD-10-CM | POA: Diagnosis not present

## 2020-04-21 DIAGNOSIS — I4891 Unspecified atrial fibrillation: Secondary | ICD-10-CM | POA: Diagnosis not present

## 2020-04-21 DIAGNOSIS — I10 Essential (primary) hypertension: Secondary | ICD-10-CM | POA: Diagnosis not present

## 2020-04-21 DIAGNOSIS — I272 Pulmonary hypertension, unspecified: Secondary | ICD-10-CM | POA: Diagnosis not present

## 2020-04-21 DIAGNOSIS — G4733 Obstructive sleep apnea (adult) (pediatric): Secondary | ICD-10-CM | POA: Diagnosis not present

## 2020-04-21 DIAGNOSIS — J9611 Chronic respiratory failure with hypoxia: Secondary | ICD-10-CM | POA: Diagnosis not present

## 2020-04-24 DIAGNOSIS — J432 Centrilobular emphysema: Secondary | ICD-10-CM | POA: Diagnosis not present

## 2020-04-24 DIAGNOSIS — I4891 Unspecified atrial fibrillation: Secondary | ICD-10-CM | POA: Diagnosis not present

## 2020-04-24 DIAGNOSIS — J9611 Chronic respiratory failure with hypoxia: Secondary | ICD-10-CM | POA: Diagnosis not present

## 2020-04-24 DIAGNOSIS — G4733 Obstructive sleep apnea (adult) (pediatric): Secondary | ICD-10-CM | POA: Diagnosis not present

## 2020-04-24 DIAGNOSIS — I10 Essential (primary) hypertension: Secondary | ICD-10-CM | POA: Diagnosis not present

## 2020-04-24 DIAGNOSIS — I272 Pulmonary hypertension, unspecified: Secondary | ICD-10-CM | POA: Diagnosis not present

## 2020-04-26 ENCOUNTER — Encounter: Payer: Self-pay | Admitting: Cardiology

## 2020-04-26 ENCOUNTER — Encounter: Payer: Self-pay | Admitting: Cardiovascular Disease

## 2020-04-26 ENCOUNTER — Telehealth (INDEPENDENT_AMBULATORY_CARE_PROVIDER_SITE_OTHER): Admitting: Cardiology

## 2020-04-26 VITALS — Ht 67.0 in

## 2020-04-26 DIAGNOSIS — I480 Paroxysmal atrial fibrillation: Secondary | ICD-10-CM

## 2020-04-26 DIAGNOSIS — J432 Centrilobular emphysema: Secondary | ICD-10-CM | POA: Diagnosis not present

## 2020-04-26 DIAGNOSIS — I10 Essential (primary) hypertension: Secondary | ICD-10-CM | POA: Diagnosis not present

## 2020-04-26 DIAGNOSIS — J9611 Chronic respiratory failure with hypoxia: Secondary | ICD-10-CM

## 2020-04-26 DIAGNOSIS — I272 Pulmonary hypertension, unspecified: Secondary | ICD-10-CM | POA: Diagnosis not present

## 2020-04-26 DIAGNOSIS — Z515 Encounter for palliative care: Secondary | ICD-10-CM

## 2020-04-26 DIAGNOSIS — G4733 Obstructive sleep apnea (adult) (pediatric): Secondary | ICD-10-CM | POA: Diagnosis not present

## 2020-04-26 DIAGNOSIS — I4891 Unspecified atrial fibrillation: Secondary | ICD-10-CM | POA: Diagnosis not present

## 2020-04-26 NOTE — Patient Instructions (Signed)
Medication Instructions:  Your physician recommends that you continue on your current medications as directed. Please refer to the Current Medication list given to you today.  *If you need a refill on your cardiac medications before your next appointment, please call your pharmacy*   Follow-Up: At [facility], you and your health needs are our priority.  As part of our continuing mission to provide you with exceptional heart care, we have created designated Provider Care Teams.  These Care Teams include your primary Cardiologist (physician) and Advanced Practice Providers (APPs -  Physician Assistants and Nurse Practitioners) who all work together to provide you with the care you need, when you need it.  We recommend signing up for the patient portal called "MyChart".  Sign up information is provided on this After Visit Summary.  MyChart is used to connect with patients for Virtual Visits (Telemedicine).  Patients are able to view lab/test results, encounter notes, upcoming appointments, etc.  Non-urgent messages can be sent to your provider as well.   To learn more about what you can do with MyChart, go to [email or web address].    Your next appointment:   3 month(s)  The format for your next appointment:   Virtual Visit   Provider:   Skeet Latch, MD   Other Instructions Our office will call you to schedule your follow-up appointment.

## 2020-04-26 NOTE — Progress Notes (Signed)
Virtual Visit via Telephone Note   This visit type was conducted due to national recommendations for restrictions regarding the COVID-19 Pandemic (e.g. social distancing) in an effort to limit this patient's exposure and mitigate transmission in our community.  Due to his co-morbid illnesses, this patient is at least at moderate risk for complications without adequate follow up.  This format is felt to be most appropriate for this patient at this time.  The patient did not have access to video technology/had technical difficulties with video requiring transitioning to audio format only (telephone).  All issues noted in this document were discussed and addressed.  No physical exam could be performed with this format.  Please refer to the patient's chart for his  consent to telehealth for [facility].   The patient was identified using 2 identifiers.  Date:  04/26/2020   ID:  Andrew [name], DOB [birth year], MRN [id number]  Patient Location: Home Provider Location: Home  PCP:  Lujean Amel, MD  Cardiologist:  Skeet Latch, MD  Electrophysiologist:  None   Evaluation Performed:  Follow-Up Visit  Chief Complaint:  weakness  History of Present Illness:    Andrew [name] is a 79 y.o. male with history of PAF and chronic respiratory failure.  He is on hospice.  He was seen in the office couple weeks ago with complaints of lower extremity edema.  Lasix was added.  A week later he was in the emergency room with acute shortness of breath.  He was discharged home from the emergency room back to hospice.  He was contacted today for routine follow-up from his visit 3 weeks ago.  Since that office visit besides the ER visit he has done well.  He does say he is weak and at times feels like he could "pass out".  He does think the Lasix helps.  The patient does not have symptoms concerning for COVID-19 infection (fever, chills, cough, or new shortness of breath).    Past Medical History:    Diagnosis Date  . Anemia   . BiPAP (biphasic positive airway pressure) dependence    Pt denies history of OSA  . BPH (benign prostatic hyperplasia)   . Cancer ([place])    skin  . COPD (chronic obstructive pulmonary disease) ([place])   . Dysphagia   . Emphysema lung ([place])   . GERD (gastroesophageal reflux disease)    " Silent reflux"  . Hearing loss    right ear  . History of hiatal hernia   . HLD (hyperlipidemia)   . Hypertension   . Oxygen dependent    2-3 liters  . Oxygen dependent   . Pneumonia   . Pulmonary hypertension ([place])   . Sleep apnea    wears cpap   Past Surgical History:  Procedure Laterality Date  . CARDIAC CATHETERIZATION    . CATARACT EXTRACTION W/ INTRAOCULAR LENS  IMPLANT, BILATERAL    . COLONOSCOPY WITH PROPOFOL N/A 04/20/2018   Procedure: COLONOSCOPY WITH PROPOFOL;  Surgeon: Wilford Corner, MD;  Location: [place];  Service: Endoscopy;  Laterality: N/A;  . ESOPHAGOGASTRODUODENOSCOPY (EGD) WITH PROPOFOL N/A 08/23/2016   Procedure: ESOPHAGOGASTRODUODENOSCOPY (EGD) WITH PROPOFOL;  Surgeon: Wilford Corner, MD;  Location[facility] ENDOSCOPY;  Service: Endoscopy;  Laterality: N/A;  . ESOPHAGOGASTRODUODENOSCOPY (EGD) WITH PROPOFOL N/A 04/20/2018   Procedure: ESOPHAGOGASTRODUODENOSCOPY (EGD) WITH PROPOFOL;  Surgeon: Wilford Corner, MD;  Location: [place];  Service: Endoscopy;  Laterality: N/A;  . HOT HEMOSTASIS N/A 04/20/2018   Procedure: HOT HEMOSTASIS (ARGON PLASMA COAGULATION/BICAP);  Surgeon: Wilford Corner, MD;  Location: [place];  Service: Endoscopy;  Laterality: N/A;  . LUNG SURGERY    . POLYPECTOMY  04/20/2018   Procedure: POLYPECTOMY;  Surgeon: Wilford Corner, MD;  Location: [place];  Service: Endoscopy;;  . TONSILLECTOMY       Current Meds  Medication Sig  . albuterol (PROVENTIL HFA;VENTOLIN HFA) 108 (90 Base) MCG/ACT inhaler Inhale 2 puffs into the lungs every 6 (six) hours as needed for wheezing or shortness of breath.  Marland Kitchen aspirin EC  81 MG tablet Take 81 mg by mouth daily.  Marland Kitchen BROVANA 15 MCG/2ML NEBU USE 1 VIAL  IN  NEBULIZER TWICE  DAILY - Morning and Evening  . budesonide (PULMICORT) 0.5 MG/2ML nebulizer solution USE 1 VIAL  IN  NEBULIZER TWICE  DAILY - Rinse mouth after treatment  . diltiazem (CARDIZEM CD) 180 MG 24 hr capsule TAKE 1 CAPSULE EVERY DAY (NEEDS OFFICE VISIT FOR FURTHER REFILLS)  . finasteride (PROSCAR) 5 MG tablet Take 5 mg by mouth at bedtime.   . fluticasone (FLONASE) 50 MCG/ACT nasal spray Place 1 spray into both nostrils at bedtime.   . furosemide (LASIX) 40 MG tablet Take 1 tablet (40 mg total) by mouth daily.  Marland Kitchen guaiFENesin (MUCINEX) 600 MG 12 hr tablet Take 1 tablet (600 mg total) by mouth 2 (two) times daily.  . hydrocortisone cream 1 % Apply 1 application topically daily as needed (Eczema on face and head).  Marland Kitchen ipratropium-albuterol (DUONEB) 0.5-2.5 (3) MG/3ML SOLN Take 3 mLs by nebulization every 6 (six) hours as needed.  Marland Kitchen morphine (MSIR) 15 MG tablet Take 1 tablet (15 mg total) by mouth every 4 (four) hours.  . Multiple Vitamins-Minerals (MULTIVITAMIN WITH MINERALS) tablet Take 1 tablet by mouth daily.  Marland Kitchen MYRBETRIQ 25 MG TB24 tablet Take 25 mg by mouth daily.  Marland Kitchen omeprazole (PRILOSEC) 40 MG capsule Take 40 mg by mouth 2 (two) times daily.   . OXYGEN Inhale 2-3 L into the lungs See admin instructions. 2L daytime, 3L at night   . polyethylene glycol (MIRALAX / GLYCOLAX) packet Take 17 g by mouth daily. (Patient taking differently: Take 17 g by mouth at bedtime. Mix in 8 oz liquid and drink)  . potassium chloride 20 MEQ TBCR Take 20 mEq by mouth daily.  . predniSONE (DELTASONE) 10 MG tablet TAKE 1 TABLET BY MOUTH DAILY WITH BREAKFAST  . rosuvastatin (CRESTOR) 10 MG tablet Take 10 mg by mouth daily.  . sildenafil (REVATIO) 20 MG tablet Take 1 tablet (20 mg total) by mouth 2 (two) times daily.  Marland Kitchen Spacer/Aero-Holding Chambers (AEROCHAMBER MV) inhaler Use as instructed  . tamsulosin (FLOMAX) 0.4 MG CAPS  capsule Take 1 capsule (0.4 mg total) by mouth daily after breakfast.  . temazepam (RESTORIL) 30 MG capsule Take 1 capsule (30 mg total) by mouth at bedtime.  . vitamin C (ASCORBIC ACID) 500 MG tablet Take 500 mg by mouth 2 (two) times daily.   Marland Kitchen VITAMIN D PO Take 1 tablet by mouth daily.     Allergies:   Patient has no known allergies.   Social History   Tobacco Use  . Smoking status: Former Smoker    Packs/day: 0.00    Years: 0.00    Pack years: 0.00    Types: Cigarettes    Quit date: 10/01/1984    Years since quitting: 35.5  . Smokeless tobacco: Never Used  Substance Use Topics  . Alcohol use: No    Alcohol/week: 0.0 standard drinks  .  Drug use: No     Family Hx: The patient's family history includes Bone cancer in his brother; Cancer in his maternal uncle; Congestive Heart Failure in his maternal grandfather; Pancreatitis in his mother; Stroke in his maternal grandfather.  ROS:   Please see the history of present illness.    All other systems reviewed and are negative.   Prior CV studies:   The following studies were reviewed today: Echo Oct 2018- Study Conclusions   - Left ventricle: The cavity size was normal. Wall thickness was  normal. Systolic function was normal. The estimated ejection  fraction was in the range of 55% to 60%. Wall motion was normal;  there were no regional wall motion abnormalities. The study is  not technically sufficient to allow evaluation of LV diastolic  function.  - Pulmonary arteries: PA peak pressure: 33 mm Hg (S).  - Pericardium, extracardiac: A trivial pericardial effusion was  identified.   Impressions:   - Technically difficult; no apical views; normal LV systolic  function; trace MR and mild TR.    Labs/Other Tests and Data Reviewed:    EKG:  An ECG dated 04/15/2020 was personally reviewed today and demonstrated:  NSR, ST 105, PVC, incomplete RBBB, diffuse ST depression  Recent Labs: 11/18/2019: ALT 19;  Brain Natriuretic Peptide 18; BUN 25; Creat 1.11; Hemoglobin 15.6; Platelets 238; Potassium 5.2; Sodium 142   Recent Lipid Panel No results found for: CHOL, TRIG, HDL, CHOLHDL, LDLCALC, LDLDIRECT  Wt Readings from Last 3 Encounters:  04/15/20 135 lb (61.2 kg)  04/05/20 145 lb (65.8 kg)  12/22/19 144 lb 8 oz (65.5 kg)     Objective:    Vital Signs:  Ht 5\' 7"  (1.702 m)   BMI 21.14 kg/m    VITAL SIGNS:  reviewed  ASSESSMENT & PLAN:    End stage COPD- On Hospice rx at home  H/O PAF- NSR during recent ED visit  Plan: No change in Rx- contact hospice for increasing symptoms.  Virtual f/u with Dr Oval Linsey in 3 months.   COVID-19 Education: The signs and symptoms of COVID-19 were discussed with the patient and how to seek care for testing (follow up with PCP or arrange E-visit).  The importance of social distancing was discussed today.  Time:   Today, I have spent 10 minutes with the patient with telehealth technology discussing the above problems.     Medication Adjustments/Labs and Tests Ordered: Current medicines are reviewed at length with the patient today.  Concerns regarding medicines are outlined above.   Tests Ordered: No orders of the defined types were placed in this encounter.   Medication Changes: No orders of the defined types were placed in this encounter.   Follow Up:  Virtual Visit  Dr Oval Linsey in 3 months  Signed, [name], Hershal Coria  04/26/2020 10:39 AM    [place]

## 2020-04-26 NOTE — Telephone Encounter (Signed)
error 

## 2020-04-27 ENCOUNTER — Encounter: Payer: Self-pay | Admitting: Cardiovascular Disease

## 2020-04-27 ENCOUNTER — Telehealth (INDEPENDENT_AMBULATORY_CARE_PROVIDER_SITE_OTHER): Admitting: Cardiovascular Disease

## 2020-04-27 VITALS — BP 117/73 | HR 75 | Ht 67.0 in | Wt 140.0 lb

## 2020-04-27 DIAGNOSIS — I272 Pulmonary hypertension, unspecified: Secondary | ICD-10-CM

## 2020-04-27 DIAGNOSIS — I4891 Unspecified atrial fibrillation: Secondary | ICD-10-CM | POA: Diagnosis not present

## 2020-04-27 DIAGNOSIS — J449 Chronic obstructive pulmonary disease, unspecified: Secondary | ICD-10-CM

## 2020-04-27 DIAGNOSIS — I10 Essential (primary) hypertension: Secondary | ICD-10-CM | POA: Diagnosis not present

## 2020-04-27 DIAGNOSIS — I480 Paroxysmal atrial fibrillation: Secondary | ICD-10-CM

## 2020-04-27 DIAGNOSIS — G459 Transient cerebral ischemic attack, unspecified: Secondary | ICD-10-CM | POA: Diagnosis not present

## 2020-04-27 DIAGNOSIS — J9611 Chronic respiratory failure with hypoxia: Secondary | ICD-10-CM | POA: Diagnosis not present

## 2020-04-27 DIAGNOSIS — J432 Centrilobular emphysema: Secondary | ICD-10-CM | POA: Diagnosis not present

## 2020-04-27 DIAGNOSIS — G4733 Obstructive sleep apnea (adult) (pediatric): Secondary | ICD-10-CM | POA: Diagnosis not present

## 2020-04-27 HISTORY — DX: Transient cerebral ischemic attack, unspecified: G45.9

## 2020-04-27 NOTE — Patient Instructions (Addendum)
  Medication Instructions:  DECREASE YOUR FUROSEMIDE TO Monday, Wednesday, AND Friday ONLY   *If you need a refill on your cardiac medications before your next appointment, please call your pharmacy*  Lab Work: NONE   Testing/Procedures: NONE   Follow-Up: At [facility], you and your health needs are our priority.  As part of our continuing mission to provide you with exceptional heart care, we have created designated Provider Care Teams.  These Care Teams include your primary Cardiologist (physician) and Advanced Practice Providers (APPs -  Physician Assistants and Nurse Practitioners) who all work together to provide you with the care you need, when you need it.  We recommend signing up for the patient portal called "MyChart".  Sign up information is provided on this After Visit Summary.  MyChart is used to connect with patients for Virtual Visits (Telemedicine).  Patients are able to view lab/test results, encounter notes, upcoming appointments, etc.  Non-urgent messages can be sent to your provider as well.   To learn more about what you can do with MyChart, go to [email or web address].    Your next appointment:    07/07/2020 AT 2:40 IN PERSON WITH DR [facility]

## 2020-04-27 NOTE — Progress Notes (Signed)
Cardiology Office Note   Date:  04/27/2020   ID:  Andrew [name], DOB 23-Apr-1941, MRN [id number]  PCP:  Lujean Amel, MD  Cardiologist:   Skeet Latch, MD   No chief complaint on file.    History of Present Illness: Andrew [name] is a 79 y.o. 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  Andrew [name] was admitted 08/2017 with pneumonia.  The hospitalization was complicated by atrial fibrillation with rapid ventricular response.  He had more than one episode of atrial fibrillation during the admission.  It was thought to be due to his pneumonia.  He was already on diltiazem prior to admission and metoprolol was added to his regimen.  He was started on Eliquis for anticoagulation.  Thyroid function was normal.  He had an echo that admission that revealed LVEF 55-60%.  It was noted that he has pulmonary hypertension from severe COPD.  He was previously treated in [place] and was started on sildenafil.  He has been working with Dr. Leeanne Deed the dose was lowered however, the patient declined further weaning.    Andrew [name] saw his pulmonologist and was noted to be anemic.  He had positive FOBT testing.  He was referred to Dr. Michail Sermon and underwent upper and lower endoscopy 04/2018.  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  This was treated with APC.  He also had 2 colonic polyps removed.  Since his last appointment Andrew [name] has been admitted twice with COPD exacerbations.  He is back on 2 L of supplemental oxygen.  However he feels that his breathing has not yet reached baseline.  He plans to start back going to the gym this week.  At the gym he likes to use the treadmill, step machine, and weight training.  He has no exertional chest pain or pressure.  He  has not had any lower extremity edema, orthopnea, or PND.  He has struggled with some urinary incontinence when he tries to walk quickly and gets overexerted.  He plans to follow-up with his urologist about this.  Andrew [name] was seen in the ED 04/15/20 with shortness of breath and confusion.  He also reported abdominal pain.  He was treated with albuterol, Atrovent, Solu-Medrol, and magnesium.  He was given IV morphine for his pain.  He declined further work-up and wanted to go back home with continued hospice care.  Prior to that he was seen in the office with increased edema and was started on Lasix.  The edema improved quickly after starting Lasix.  Lately his he has been feeling dry dehydrated.  He saw [name], PA-C, yesterday and was feeling better but felt weak as though he could pass out at times.  He continued to have abdominal pain until he started wearing an abdominal binder which seems to be helping.  For the last 2-3 days he hasn't had any pain.  He still struggles with shortness of breath with exertion.  He gets short of breath with moving in and out of chair or bed.  He reports two episodes of mini strokes.  When he tries to talk it comes out garbled and when he texts it doesn't make any sense.  This has occurred twice, most recently last week.  He is unsure what to do when this happens  because he is currently in hospice care.  Of note, he had a CT of the abdomen and pelvis 02/2020 that showed an ill-defined density in the pancreatic head and an enhancing nodule in the retroperitoneum.  He denies any weight loss.   Past Medical History:  Diagnosis Date  . Anemia   . BiPAP (biphasic positive airway pressure) dependence    Pt denies history of OSA  . BPH (benign prostatic hyperplasia)   . Cancer ([place])    skin  . COPD (chronic obstructive pulmonary disease) ([place])   . Dysphagia   . Emphysema lung ([place])   . GERD (gastroesophageal reflux disease)    " Silent reflux"  . Hearing loss     right ear  . History of hiatal hernia   . HLD (hyperlipidemia)   . Hypertension   . Oxygen dependent    2-3 liters  . Oxygen dependent   . Pneumonia   . Pulmonary hypertension ([place])   . Sleep apnea    wears cpap  . TIA (transient ischemic attack) 04/27/2020    Past Surgical History:  Procedure Laterality Date  . CARDIAC CATHETERIZATION    . CATARACT EXTRACTION W/ INTRAOCULAR LENS  IMPLANT, BILATERAL    . COLONOSCOPY WITH PROPOFOL N/A 04/20/2018   Procedure: COLONOSCOPY WITH PROPOFOL;  Surgeon: Wilford Corner, MD;  Location: [facility];  Service: Endoscopy;  Laterality: N/A;  . ESOPHAGOGASTRODUODENOSCOPY (EGD) WITH PROPOFOL N/A 08/23/2016   Procedure: ESOPHAGOGASTRODUODENOSCOPY (EGD) WITH PROPOFOL;  Surgeon: Wilford Corner, MD;  Location: [facility] ENDOSCOPY;  Service: Endoscopy;  Laterality: N/A;  . ESOPHAGOGASTRODUODENOSCOPY (EGD) WITH PROPOFOL N/A 04/20/2018   Procedure: ESOPHAGOGASTRODUODENOSCOPY (EGD) WITH PROPOFOL;  Surgeon: Wilford Corner, MD;  Location: [facility];  Service: Endoscopy;  Laterality: N/A;  . HOT HEMOSTASIS N/A 04/20/2018   Procedure: HOT HEMOSTASIS (ARGON PLASMA COAGULATION/BICAP);  Surgeon: Wilford Corner, MD;  Location: [facility];  Service: Endoscopy;  Laterality: N/A;  . LUNG SURGERY    . POLYPECTOMY  04/20/2018   Procedure: POLYPECTOMY;  Surgeon: Wilford Corner, MD;  Location: [facility] ENDOSCOPY;  Service: Endoscopy;;  . TONSILLECTOMY       Current Outpatient Medications  Medication Sig Dispense Refill  . albuterol (PROVENTIL HFA;VENTOLIN HFA) 108 (90 Base) MCG/ACT inhaler Inhale 2 puffs into the lungs every 6 (six) hours as needed for wheezing or shortness of breath. 1 Inhaler 11  . aspirin EC 81 MG tablet Take 81 mg by mouth daily.    Marland Kitchen diltiazem (CARDIZEM CD) 180 MG 24 hr capsule TAKE 1 CAPSULE EVERY DAY (NEEDS OFFICE VISIT FOR FURTHER REFILLS) 60 capsule 3  . finasteride (PROSCAR) 5 MG tablet Take 5 mg by mouth at bedtime.     . fluticasone (FLONASE)  50 MCG/ACT nasal spray Place 1 spray into both nostrils at bedtime.     . furosemide (LASIX) 40 MG tablet Take 40 mg by mouth as directed. TAKE 1 TABLET Monday, Wednesday, AND Friday ONLY    . guaiFENesin (MUCINEX) 600 MG 12 hr tablet Take 1 tablet (600 mg total) by mouth 2 (two) times daily. 30 tablet 0  . hydrocortisone cream 1 % Apply 1 application topically daily as needed (Eczema on face and head).    Marland Kitchen ipratropium-albuterol (DUONEB) 0.5-2.5 (3) MG/3ML SOLN Take 3 mLs by nebulization every 6 (six) hours as needed. 360 mL 3  . morphine (MSIR) 15 MG tablet Take 1 tablet (15 mg total) by mouth every 4 (four) hours. 180 tablet 0  . Multiple Vitamins-Minerals (MULTIVITAMIN WITH MINERALS) tablet Take  1 tablet by mouth daily.    Marland Kitchen omeprazole (PRILOSEC) 40 MG capsule Take 40 mg by mouth 2 (two) times daily.     . OXYGEN Inhale 2-3 L into the lungs See admin instructions. 2L daytime, 3L at night     . polyethylene glycol (MIRALAX / GLYCOLAX) packet Take 17 g by mouth daily. (Patient taking differently: Take 17 g by mouth at bedtime. Mix in 8 oz liquid and drink) 14 each 1  . potassium chloride 20 MEQ TBCR Take 20 mEq by mouth daily. 30 tablet 6  . predniSONE (DELTASONE) 10 MG tablet TAKE 1 TABLET BY MOUTH DAILY WITH BREAKFAST 30 tablet 4  . rosuvastatin (CRESTOR) 10 MG tablet Take 10 mg by mouth daily.    . sildenafil (REVATIO) 20 MG tablet Take 1 tablet (20 mg total) by mouth 2 (two) times daily. 180 tablet 3  . Spacer/Aero-Holding Chambers (AEROCHAMBER MV) inhaler Use as instructed 1 each 0  . tamsulosin (FLOMAX) 0.4 MG CAPS capsule Take 1 capsule (0.4 mg total) by mouth daily after breakfast. 30 capsule 0  . temazepam (RESTORIL) 30 MG capsule Take 1 capsule (30 mg total) by mouth at bedtime. 30 capsule 5  . vitamin C (ASCORBIC ACID) 500 MG tablet Take 500 mg by mouth 2 (two) times daily.     Marland Kitchen VITAMIN E PO Take by mouth daily.     No current facility-administered medications for this visit.     Allergies:   Patient has no known allergies.    Social History:  The patient  reports that he quit smoking about 35 years ago. His smoking use included cigarettes. He smoked 0.00 packs per day for 0.00 years. He has never used smokeless tobacco. He reports that he does not drink alcohol and does not use drugs.   Family History:  The patient's family history includes Bone cancer in his brother; Cancer in his maternal uncle; Congestive Heart Failure in his maternal grandfather; Pancreatitis in his mother; Stroke in his maternal grandfather.    ROS:  Please see the history of present illness.   Otherwise, review of systems are positive for none.   All other systems are reviewed and negative.   PHYSICAL EXAM: BP 117/73   Pulse 75   Ht 5\' 7"  (1.702 m)   Wt 140 lb (63.5 kg)   BMI 21.93 kg/m  GENERAL: Sounds well. No acute distress. RESP: Respirations unlabored. NEURO:  Speech fluent.  Cranial nerves grossly intact.  Moves all 4 extremities freely PSYCH:  Cognitively intact, oriented to person place and time  EKG:  EKG is not ordered today. The ekg ordered 09/22/17 demonstrates sinus rhythm.  Rate 61 bpm.   03/23/2018: Sinus bradycardia.  Rate 53 bpm. 01/26/2019: Sinus rhythm.  PACs.  Rate 82 bpm.  Incomplete right bundle branch block.  Echo 09/10/17: Study Conclusions  - Left ventricle: The cavity size was normal. Wall thickness was normal. Systolic function was normal. The estimated ejection fraction was in the range of 55% to 60%. Wall motion was normal; there were no regional wall motion abnormalities. The study is not technically sufficient to allow evaluation of LV diastolic function. - Pulmonary arteries: PA peak pressure: 33 mm Hg (S). - Pericardium, extracardiac: A trivial pericardial effusion was identified.  Impressions:  - Technically difficult; no apical views; normal LV systolic function; trace MR and mild TR.   Recent Labs: 11/18/2019: ALT 19;  Brain Natriuretic Peptide 18; BUN 25; Creat 1.11; Hemoglobin 15.6; Platelets 238; Potassium  5.2; Sodium 142   12/23/2017: Total cholesterol 117, triglycerides 107, HDL 59, LDL 37  Lipid Panel No results found for: CHOL, TRIG, HDL, CHOLHDL, VLDL, LDLCALC, LDLDIRECT    Wt Readings from Last 3 Encounters:  04/27/20 140 lb (63.5 kg)  04/15/20 135 lb (61.2 kg)  04/05/20 145 lb (65.8 kg)      ASSESSMENT AND PLAN:  # Paroxysmal atrial fibrillation:  Andrew [name]  denies any recent palpitations.  He was asymptomatic in atrial fibrillation.  Continue diltiazem and Eliquis.  # Pulmonary hypertension:  Andrew [name] has been on sildenafil since 2007. PASP was only 33 mmHg on echo 09/10/17. This is odd given that he has significant pulmonary disease. Typically this medication would worsen V/Q mismatch in a patient with severe COPD.   However he has been stable so we will not stop it.  He had some improved with Lasix.  I am unable to examine him given that this is a telephone appointment.  However it seems as though he is not dehydrated and dry.  We will reduce Lasix to Monday Wednesday Friday.  He is okay to take an extra dose as needed for weight gain or edema.  # TIA:  Andrew [name] had a couple episodes of garbled speech and confusion.  This does seem as though it could be a TIA.  He is currently in hospice care and debating what he would want to do about this.  If he has another episode we did discuss going to the ED to be evaluated if this is consistent with what he would want.  # Hyperlipidemia:  Continue rosuvastatin given his episodes of likely TIAs.    Current medicines are reviewed at length with the patient today.  The patient does not have concerns regarding medicines.  The following changes have been made:  Reduce lasix to MWF.  Labs/ tests ordered today include:  No orders of the defined types were placed in this encounter.    Disposition:   FU with Kaeden [name] C. Oval Linsey, MD, [facility] in  2-3 months   Signed, Kallum [name] C. Oval Linsey, MD, [facility]  04/27/2020 2:15 PM    [facility]

## 2020-04-28 DIAGNOSIS — I4891 Unspecified atrial fibrillation: Secondary | ICD-10-CM | POA: Diagnosis not present

## 2020-04-28 DIAGNOSIS — G4733 Obstructive sleep apnea (adult) (pediatric): Secondary | ICD-10-CM | POA: Diagnosis not present

## 2020-04-28 DIAGNOSIS — J9611 Chronic respiratory failure with hypoxia: Secondary | ICD-10-CM | POA: Diagnosis not present

## 2020-04-28 DIAGNOSIS — J432 Centrilobular emphysema: Secondary | ICD-10-CM | POA: Diagnosis not present

## 2020-04-28 DIAGNOSIS — I272 Pulmonary hypertension, unspecified: Secondary | ICD-10-CM | POA: Diagnosis not present

## 2020-04-28 DIAGNOSIS — I10 Essential (primary) hypertension: Secondary | ICD-10-CM | POA: Diagnosis not present

## 2020-05-01 DIAGNOSIS — I4891 Unspecified atrial fibrillation: Secondary | ICD-10-CM | POA: Diagnosis not present

## 2020-05-01 DIAGNOSIS — G4733 Obstructive sleep apnea (adult) (pediatric): Secondary | ICD-10-CM | POA: Diagnosis not present

## 2020-05-01 DIAGNOSIS — I10 Essential (primary) hypertension: Secondary | ICD-10-CM | POA: Diagnosis not present

## 2020-05-01 DIAGNOSIS — I272 Pulmonary hypertension, unspecified: Secondary | ICD-10-CM | POA: Diagnosis not present

## 2020-05-01 DIAGNOSIS — J432 Centrilobular emphysema: Secondary | ICD-10-CM | POA: Diagnosis not present

## 2020-05-01 DIAGNOSIS — J9611 Chronic respiratory failure with hypoxia: Secondary | ICD-10-CM | POA: Diagnosis not present

## 2020-05-03 ENCOUNTER — Telehealth: Payer: Self-pay | Admitting: Cardiovascular Disease

## 2020-05-03 DIAGNOSIS — J432 Centrilobular emphysema: Secondary | ICD-10-CM | POA: Diagnosis not present

## 2020-05-03 DIAGNOSIS — G4733 Obstructive sleep apnea (adult) (pediatric): Secondary | ICD-10-CM | POA: Diagnosis not present

## 2020-05-03 DIAGNOSIS — J9611 Chronic respiratory failure with hypoxia: Secondary | ICD-10-CM | POA: Diagnosis not present

## 2020-05-03 DIAGNOSIS — I272 Pulmonary hypertension, unspecified: Secondary | ICD-10-CM | POA: Diagnosis not present

## 2020-05-03 DIAGNOSIS — I10 Essential (primary) hypertension: Secondary | ICD-10-CM | POA: Diagnosis not present

## 2020-05-03 DIAGNOSIS — I4891 Unspecified atrial fibrillation: Secondary | ICD-10-CM | POA: Diagnosis not present

## 2020-05-03 NOTE — Telephone Encounter (Signed)
Called hospice nurse back- she will stop the lasix.

## 2020-05-03 NOTE — Telephone Encounter (Signed)
Spoke with Hospice care- she states that the patient is not feeling well today- his HR has been in the range of 44, when it is normally in the 50's. She states that he also mentioned to her that he had no been taking his potassium pill with the lasix in the last 5 days.  He takes the Lasix 40 mg every other day, nurse with hospice is questioning if we can either cut back on the lasix or stop it all together.   She states that the patient does not complain of any chest pain, no swelling at all, but does mention being SOB, and feeling very fatigued.   Dr.Van Wert is not in office- so will route to DOD to get advice.

## 2020-05-03 NOTE — Telephone Encounter (Signed)
OK to stop the furosemide, call us if dyspnea worsens or if  swelling occurs

## 2020-05-03 NOTE — Telephone Encounter (Signed)
Pt c/o medication issue:  1. Name of Medication: potassium chloride 20 MEQ TBCR / furosemide (LASIX) 40 MG tablet  2. How are you currently taking this medication (dosage and times per day)? As directed, forgot to take potassium the last few days.   3. Are you having a reaction (difficulty breathing--STAT)? no  4. What is your medication issue? Almyra Free wants to know if the patient should continue taking lasix beings that he does not have any more edema.    STAT if HR is under 50 or over 120 (normal HR is 60-100 beats per minute)  1) What is your heart rate? 44  2) Do you have a log of your heart rate readings (document readings)? 56 and 52  3) Do you have any other symptoms? Almyra Free states that the patient keeps stating he does not feel well.

## 2020-05-04 DIAGNOSIS — N401 Enlarged prostate with lower urinary tract symptoms: Secondary | ICD-10-CM | POA: Diagnosis not present

## 2020-05-04 DIAGNOSIS — I4891 Unspecified atrial fibrillation: Secondary | ICD-10-CM | POA: Diagnosis not present

## 2020-05-04 DIAGNOSIS — J432 Centrilobular emphysema: Secondary | ICD-10-CM | POA: Diagnosis not present

## 2020-05-04 DIAGNOSIS — R338 Other retention of urine: Secondary | ICD-10-CM | POA: Diagnosis not present

## 2020-05-04 DIAGNOSIS — G4733 Obstructive sleep apnea (adult) (pediatric): Secondary | ICD-10-CM | POA: Diagnosis not present

## 2020-05-04 DIAGNOSIS — I272 Pulmonary hypertension, unspecified: Secondary | ICD-10-CM | POA: Diagnosis not present

## 2020-05-04 DIAGNOSIS — J9611 Chronic respiratory failure with hypoxia: Secondary | ICD-10-CM | POA: Diagnosis not present

## 2020-05-04 DIAGNOSIS — I10 Essential (primary) hypertension: Secondary | ICD-10-CM | POA: Diagnosis not present

## 2020-05-08 DIAGNOSIS — J432 Centrilobular emphysema: Secondary | ICD-10-CM | POA: Diagnosis not present

## 2020-05-08 DIAGNOSIS — I272 Pulmonary hypertension, unspecified: Secondary | ICD-10-CM | POA: Diagnosis not present

## 2020-05-08 DIAGNOSIS — I4891 Unspecified atrial fibrillation: Secondary | ICD-10-CM | POA: Diagnosis not present

## 2020-05-08 DIAGNOSIS — I10 Essential (primary) hypertension: Secondary | ICD-10-CM | POA: Diagnosis not present

## 2020-05-08 DIAGNOSIS — J9611 Chronic respiratory failure with hypoxia: Secondary | ICD-10-CM | POA: Diagnosis not present

## 2020-05-08 DIAGNOSIS — G4733 Obstructive sleep apnea (adult) (pediatric): Secondary | ICD-10-CM | POA: Diagnosis not present

## 2020-05-11 ENCOUNTER — Telehealth: Payer: Self-pay | Admitting: Pulmonary Disease

## 2020-05-11 DIAGNOSIS — I10 Essential (primary) hypertension: Secondary | ICD-10-CM | POA: Diagnosis not present

## 2020-05-11 DIAGNOSIS — I4891 Unspecified atrial fibrillation: Secondary | ICD-10-CM | POA: Diagnosis not present

## 2020-05-11 DIAGNOSIS — G4733 Obstructive sleep apnea (adult) (pediatric): Secondary | ICD-10-CM | POA: Diagnosis not present

## 2020-05-11 DIAGNOSIS — J432 Centrilobular emphysema: Secondary | ICD-10-CM | POA: Diagnosis not present

## 2020-05-11 DIAGNOSIS — I272 Pulmonary hypertension, unspecified: Secondary | ICD-10-CM | POA: Diagnosis not present

## 2020-05-11 DIAGNOSIS — J9611 Chronic respiratory failure with hypoxia: Secondary | ICD-10-CM | POA: Diagnosis not present

## 2020-05-11 NOTE — Telephone Encounter (Signed)
Thank you for the update. Andrew [name]

## 2020-05-11 NOTE — Telephone Encounter (Signed)
Thanks for letting me know CC: BM as he has been involved in his care   Garner Nash, DO [facility] 05/11/2020 11:28 AM

## 2020-05-11 NOTE — Telephone Encounter (Signed)
Spoke with Almyra Free, she just wanted to let Dr. Valeta Harms know that he was transitioning to [place]. There is nothing to do at this time. FYI Dr. Valeta Harms.

## 2020-05-11 NOTE — Telephone Encounter (Signed)
Andrew [name]

## 2020-05-11 NOTE — Telephone Encounter (Signed)
ATC Almyra Free but there was no answer. LM for her to call back.

## 2020-05-15 DIAGNOSIS — J432 Centrilobular emphysema: Secondary | ICD-10-CM | POA: Diagnosis not present

## 2020-05-15 DIAGNOSIS — J9611 Chronic respiratory failure with hypoxia: Secondary | ICD-10-CM | POA: Diagnosis not present

## 2020-05-15 DIAGNOSIS — I10 Essential (primary) hypertension: Secondary | ICD-10-CM | POA: Diagnosis not present

## 2020-05-15 DIAGNOSIS — I272 Pulmonary hypertension, unspecified: Secondary | ICD-10-CM | POA: Diagnosis not present

## 2020-05-15 DIAGNOSIS — I4891 Unspecified atrial fibrillation: Secondary | ICD-10-CM | POA: Diagnosis not present

## 2020-05-15 DIAGNOSIS — G4733 Obstructive sleep apnea (adult) (pediatric): Secondary | ICD-10-CM | POA: Diagnosis not present

## 2020-05-18 DIAGNOSIS — D649 Anemia, unspecified: Secondary | ICD-10-CM | POA: Diagnosis not present

## 2020-05-18 DIAGNOSIS — Z9981 Dependence on supplemental oxygen: Secondary | ICD-10-CM | POA: Diagnosis not present

## 2020-05-18 DIAGNOSIS — J432 Centrilobular emphysema: Secondary | ICD-10-CM | POA: Diagnosis not present

## 2020-05-18 DIAGNOSIS — I10 Essential (primary) hypertension: Secondary | ICD-10-CM | POA: Diagnosis not present

## 2020-05-18 DIAGNOSIS — Z87891 Personal history of nicotine dependence: Secondary | ICD-10-CM | POA: Diagnosis not present

## 2020-05-18 DIAGNOSIS — I4891 Unspecified atrial fibrillation: Secondary | ICD-10-CM | POA: Diagnosis not present

## 2020-05-18 DIAGNOSIS — N3281 Overactive bladder: Secondary | ICD-10-CM | POA: Diagnosis not present

## 2020-05-18 DIAGNOSIS — N400 Enlarged prostate without lower urinary tract symptoms: Secondary | ICD-10-CM | POA: Diagnosis not present

## 2020-05-18 DIAGNOSIS — G4733 Obstructive sleep apnea (adult) (pediatric): Secondary | ICD-10-CM | POA: Diagnosis not present

## 2020-05-18 DIAGNOSIS — K219 Gastro-esophageal reflux disease without esophagitis: Secondary | ICD-10-CM | POA: Diagnosis not present

## 2020-05-18 DIAGNOSIS — Z6822 Body mass index (BMI) 22.0-22.9, adult: Secondary | ICD-10-CM | POA: Diagnosis not present

## 2020-05-18 DIAGNOSIS — E785 Hyperlipidemia, unspecified: Secondary | ICD-10-CM | POA: Diagnosis not present

## 2020-05-18 DIAGNOSIS — K449 Diaphragmatic hernia without obstruction or gangrene: Secondary | ICD-10-CM | POA: Diagnosis not present

## 2020-05-18 DIAGNOSIS — I272 Pulmonary hypertension, unspecified: Secondary | ICD-10-CM | POA: Diagnosis not present

## 2020-05-18 DIAGNOSIS — J9611 Chronic respiratory failure with hypoxia: Secondary | ICD-10-CM | POA: Diagnosis not present

## 2020-05-18 DIAGNOSIS — L309 Dermatitis, unspecified: Secondary | ICD-10-CM | POA: Diagnosis not present

## 2020-05-22 DIAGNOSIS — I10 Essential (primary) hypertension: Secondary | ICD-10-CM | POA: Diagnosis not present

## 2020-05-22 DIAGNOSIS — J9611 Chronic respiratory failure with hypoxia: Secondary | ICD-10-CM | POA: Diagnosis not present

## 2020-05-22 DIAGNOSIS — I272 Pulmonary hypertension, unspecified: Secondary | ICD-10-CM | POA: Diagnosis not present

## 2020-05-22 DIAGNOSIS — G4733 Obstructive sleep apnea (adult) (pediatric): Secondary | ICD-10-CM | POA: Diagnosis not present

## 2020-05-22 DIAGNOSIS — J432 Centrilobular emphysema: Secondary | ICD-10-CM | POA: Diagnosis not present

## 2020-05-22 DIAGNOSIS — I4891 Unspecified atrial fibrillation: Secondary | ICD-10-CM | POA: Diagnosis not present

## 2020-05-24 DIAGNOSIS — I272 Pulmonary hypertension, unspecified: Secondary | ICD-10-CM | POA: Diagnosis not present

## 2020-05-24 DIAGNOSIS — I10 Essential (primary) hypertension: Secondary | ICD-10-CM | POA: Diagnosis not present

## 2020-05-24 DIAGNOSIS — I4891 Unspecified atrial fibrillation: Secondary | ICD-10-CM | POA: Diagnosis not present

## 2020-05-24 DIAGNOSIS — J9611 Chronic respiratory failure with hypoxia: Secondary | ICD-10-CM | POA: Diagnosis not present

## 2020-05-24 DIAGNOSIS — G4733 Obstructive sleep apnea (adult) (pediatric): Secondary | ICD-10-CM | POA: Diagnosis not present

## 2020-05-24 DIAGNOSIS — J432 Centrilobular emphysema: Secondary | ICD-10-CM | POA: Diagnosis not present

## 2020-05-25 DIAGNOSIS — J432 Centrilobular emphysema: Secondary | ICD-10-CM | POA: Diagnosis not present

## 2020-05-25 DIAGNOSIS — I4891 Unspecified atrial fibrillation: Secondary | ICD-10-CM | POA: Diagnosis not present

## 2020-05-25 DIAGNOSIS — I10 Essential (primary) hypertension: Secondary | ICD-10-CM | POA: Diagnosis not present

## 2020-05-25 DIAGNOSIS — G4733 Obstructive sleep apnea (adult) (pediatric): Secondary | ICD-10-CM | POA: Diagnosis not present

## 2020-05-25 DIAGNOSIS — I272 Pulmonary hypertension, unspecified: Secondary | ICD-10-CM | POA: Diagnosis not present

## 2020-05-25 DIAGNOSIS — J9611 Chronic respiratory failure with hypoxia: Secondary | ICD-10-CM | POA: Diagnosis not present

## 2020-05-26 ENCOUNTER — Telehealth: Payer: Self-pay | Admitting: Primary Care

## 2020-05-26 DIAGNOSIS — I10 Essential (primary) hypertension: Secondary | ICD-10-CM | POA: Diagnosis not present

## 2020-05-26 DIAGNOSIS — I272 Pulmonary hypertension, unspecified: Secondary | ICD-10-CM | POA: Diagnosis not present

## 2020-05-26 DIAGNOSIS — G4733 Obstructive sleep apnea (adult) (pediatric): Secondary | ICD-10-CM | POA: Diagnosis not present

## 2020-05-26 DIAGNOSIS — I4891 Unspecified atrial fibrillation: Secondary | ICD-10-CM | POA: Diagnosis not present

## 2020-05-26 DIAGNOSIS — J9611 Chronic respiratory failure with hypoxia: Secondary | ICD-10-CM | POA: Diagnosis not present

## 2020-05-26 DIAGNOSIS — J432 Centrilobular emphysema: Secondary | ICD-10-CM | POA: Diagnosis not present

## 2020-05-26 MED ORDER — LEVALBUTEROL HCL 0.63 MG/3ML IN NEBU
0.6300 mg | INHALATION_SOLUTION | RESPIRATORY_TRACT | 5 refills | Status: DC | PRN
Start: 2020-05-26 — End: 2020-07-07

## 2020-05-26 NOTE — Telephone Encounter (Signed)
He can have xopenex breathing treatment every 4 hours as needed for comfort

## 2020-05-26 NOTE — Telephone Encounter (Signed)
Hospice patient of Dr. Valeta Harms, RN called to report increased SOB for one week, pain in chest, using morphine and lorazepam every four hours around the clock, also taking albuterol when needed, wheezing heard with decreased breath sounds in the bases. RN wants to know if she should be doing anything else. Please advise.  Hospice RN [phone] Almyra Free

## 2020-05-26 NOTE — Telephone Encounter (Signed)
Updated hospice RN, sent prescription for Xopenex as requested.

## 2020-05-28 ENCOUNTER — Inpatient Hospital Stay (HOSPITAL_COMMUNITY)
Admission: EM | Admit: 2020-05-28 | Discharge: 2020-06-03 | DRG: 190 | Disposition: A | Discharge status: Hospice | Attending: Internal Medicine | Admitting: Internal Medicine

## 2020-05-28 ENCOUNTER — Emergency Department (HOSPITAL_COMMUNITY)

## 2020-05-28 DIAGNOSIS — I4891 Unspecified atrial fibrillation: Secondary | ICD-10-CM | POA: Diagnosis not present

## 2020-05-28 DIAGNOSIS — Z8673 Personal history of transient ischemic attack (TIA), and cerebral infarction without residual deficits: Secondary | ICD-10-CM

## 2020-05-28 DIAGNOSIS — Z9981 Dependence on supplemental oxygen: Secondary | ICD-10-CM

## 2020-05-28 DIAGNOSIS — Z8701 Personal history of pneumonia (recurrent): Secondary | ICD-10-CM | POA: Diagnosis not present

## 2020-05-28 DIAGNOSIS — I10 Essential (primary) hypertension: Secondary | ICD-10-CM | POA: Diagnosis not present

## 2020-05-28 DIAGNOSIS — R0600 Dyspnea, unspecified: Secondary | ICD-10-CM | POA: Diagnosis not present

## 2020-05-28 DIAGNOSIS — Z66 Do not resuscitate: Secondary | ICD-10-CM | POA: Diagnosis not present

## 2020-05-28 DIAGNOSIS — J441 Chronic obstructive pulmonary disease with (acute) exacerbation: Secondary | ICD-10-CM | POA: Diagnosis present

## 2020-05-28 DIAGNOSIS — Z7901 Long term (current) use of anticoagulants: Secondary | ICD-10-CM | POA: Diagnosis not present

## 2020-05-28 DIAGNOSIS — I480 Paroxysmal atrial fibrillation: Secondary | ICD-10-CM | POA: Diagnosis present

## 2020-05-28 DIAGNOSIS — Z79899 Other long term (current) drug therapy: Secondary | ICD-10-CM

## 2020-05-28 DIAGNOSIS — R0602 Shortness of breath: Secondary | ICD-10-CM | POA: Diagnosis not present

## 2020-05-28 DIAGNOSIS — Z7952 Long term (current) use of systemic steroids: Secondary | ICD-10-CM

## 2020-05-28 DIAGNOSIS — I272 Pulmonary hypertension, unspecified: Secondary | ICD-10-CM | POA: Diagnosis not present

## 2020-05-28 DIAGNOSIS — Z515 Encounter for palliative care: Secondary | ICD-10-CM | POA: Diagnosis present

## 2020-05-28 DIAGNOSIS — R000 Tachycardia, unspecified: Secondary | ICD-10-CM | POA: Diagnosis not present

## 2020-05-28 DIAGNOSIS — J439 Emphysema, unspecified: Secondary | ICD-10-CM | POA: Diagnosis present

## 2020-05-28 DIAGNOSIS — J9611 Chronic respiratory failure with hypoxia: Secondary | ICD-10-CM | POA: Diagnosis not present

## 2020-05-28 DIAGNOSIS — Z9989 Dependence on other enabling machines and devices: Secondary | ICD-10-CM

## 2020-05-28 DIAGNOSIS — E785 Hyperlipidemia, unspecified: Secondary | ICD-10-CM | POA: Diagnosis present

## 2020-05-28 DIAGNOSIS — Z8249 Family history of ischemic heart disease and other diseases of the circulatory system: Secondary | ICD-10-CM

## 2020-05-28 DIAGNOSIS — Z7982 Long term (current) use of aspirin: Secondary | ICD-10-CM | POA: Diagnosis not present

## 2020-05-28 DIAGNOSIS — N400 Enlarged prostate without lower urinary tract symptoms: Secondary | ICD-10-CM | POA: Diagnosis present

## 2020-05-28 DIAGNOSIS — Z20822 Contact with and (suspected) exposure to covid-19: Secondary | ICD-10-CM | POA: Diagnosis not present

## 2020-05-28 DIAGNOSIS — J449 Chronic obstructive pulmonary disease, unspecified: Secondary | ICD-10-CM | POA: Diagnosis not present

## 2020-05-28 DIAGNOSIS — Z7189 Other specified counseling: Secondary | ICD-10-CM | POA: Diagnosis not present

## 2020-05-28 DIAGNOSIS — Z85828 Personal history of other malignant neoplasm of skin: Secondary | ICD-10-CM | POA: Diagnosis not present

## 2020-05-28 DIAGNOSIS — Z87891 Personal history of nicotine dependence: Secondary | ICD-10-CM

## 2020-05-28 DIAGNOSIS — G4733 Obstructive sleep apnea (adult) (pediatric): Secondary | ICD-10-CM | POA: Diagnosis present

## 2020-05-28 DIAGNOSIS — K219 Gastro-esophageal reflux disease without esophagitis: Secondary | ICD-10-CM | POA: Diagnosis present

## 2020-05-28 DIAGNOSIS — H9191 Unspecified hearing loss, right ear: Secondary | ICD-10-CM | POA: Diagnosis present

## 2020-05-28 DIAGNOSIS — Z823 Family history of stroke: Secondary | ICD-10-CM | POA: Diagnosis not present

## 2020-05-28 DIAGNOSIS — J9621 Acute and chronic respiratory failure with hypoxia: Secondary | ICD-10-CM | POA: Diagnosis present

## 2020-05-28 DIAGNOSIS — J432 Centrilobular emphysema: Secondary | ICD-10-CM | POA: Diagnosis not present

## 2020-05-28 DIAGNOSIS — R069 Unspecified abnormalities of breathing: Secondary | ICD-10-CM | POA: Diagnosis not present

## 2020-05-28 LAB — CBC WITH DIFFERENTIAL/PLATELET
Abs Immature Granulocytes: 0.02 10*3/uL (ref 0.00–0.07)
Basophils Absolute: 0 10*3/uL (ref 0.0–0.1)
Basophils Relative: 1 %
Eosinophils Absolute: 0.2 10*3/uL (ref 0.0–0.5)
Eosinophils Relative: 2 %
HCT: 40.9 % (ref 39.0–52.0)
Hemoglobin: 13.3 g/dL (ref 13.0–17.0)
Immature Granulocytes: 0 %
Lymphocytes Relative: 9 %
Lymphs Abs: 0.8 10*3/uL (ref 0.7–4.0)
MCH: 33 pg (ref 26.0–34.0)
MCHC: 32.5 g/dL (ref 30.0–36.0)
MCV: 101.5 fL — ABNORMAL HIGH (ref 80.0–100.0)
Monocytes Absolute: 0.1 10*3/uL (ref 0.1–1.0)
Monocytes Relative: 2 %
Neutro Abs: 7.7 10*3/uL (ref 1.7–7.7)
Neutrophils Relative %: 86 %
Platelets: 187 10*3/uL (ref 150–400)
RBC: 4.03 MIL/uL — ABNORMAL LOW (ref 4.22–5.81)
RDW: 12.4 % (ref 11.5–15.5)
WBC: 8.9 10*3/uL (ref 4.0–10.5)
nRBC: 0 % (ref 0.0–0.2)

## 2020-05-28 LAB — BASIC METABOLIC PANEL
Anion gap: 13 (ref 5–15)
BUN: 11 mg/dL (ref 8–23)
CO2: 27 mmol/L (ref 22–32)
Calcium: 9.2 mg/dL (ref 8.9–10.3)
Chloride: 100 mmol/L (ref 98–111)
Creatinine, Ser: 0.88 mg/dL (ref 0.61–1.24)
GFR calc Af Amer: >60 mL/min (ref >60)
GFR calc non Af Amer: >60 mL/min (ref >60)
Glucose, Bld: 152 mg/dL — ABNORMAL HIGH (ref 70–99)
Potassium: 4.1 mmol/L (ref 3.5–5.1)
Sodium: 140 mmol/L (ref 135–145)

## 2020-05-28 LAB — SARS CORONAVIRUS 2 BY RT PCR (HOSPITAL ORDER, PERFORMED IN ~~LOC~~ HOSPITAL LAB): SARS Coronavirus 2: NEGATIVE

## 2020-05-28 MED ORDER — DILTIAZEM LOAD VIA INFUSION
10.0000 mg | Freq: Once | INTRAVENOUS | Status: AC
  Administered 2020-05-28: 10 mg via INTRAVENOUS
  Filled 2020-05-28 (×2): qty 10

## 2020-05-28 MED ORDER — ALBUTEROL (5 MG/ML) CONTINUOUS INHALATION SOLN
10.0000 mg/h | INHALATION_SOLUTION | Freq: Once | RESPIRATORY_TRACT | Status: AC
  Administered 2020-05-28: 10 mg/h via RESPIRATORY_TRACT

## 2020-05-28 MED ORDER — LORAZEPAM 0.5 MG PO TABS
0.5000 mg | ORAL_TABLET | ORAL | Status: DC
  Administered 2020-05-28 – 2020-06-03 (×33): 0.5 mg via ORAL
  Filled 2020-05-28 (×33): qty 1

## 2020-05-28 MED ORDER — MORPHINE SULFATE 15 MG PO TABS
15.0000 mg | ORAL_TABLET | ORAL | Status: DC
  Administered 2020-05-28 – 2020-06-03 (×33): 15 mg via ORAL
  Filled 2020-05-28 (×34): qty 1

## 2020-05-28 MED ORDER — LEVALBUTEROL HCL 0.63 MG/3ML IN NEBU
0.6300 mg | INHALATION_SOLUTION | RESPIRATORY_TRACT | Status: DC | PRN
  Administered 2020-05-29 – 2020-05-31 (×2): 0.63 mg via RESPIRATORY_TRACT
  Filled 2020-05-28 (×2): qty 3

## 2020-05-28 MED ORDER — APIXABAN 5 MG PO TABS
5.0000 mg | ORAL_TABLET | Freq: Two times a day (BID) | ORAL | Status: DC
  Administered 2020-05-28 – 2020-06-03 (×12): 5 mg via ORAL
  Filled 2020-05-28 (×12): qty 1

## 2020-05-28 MED ORDER — MORPHINE SULFATE (PF) 2 MG/ML IV SOLN
2.0000 mg | Freq: Once | INTRAVENOUS | Status: AC
  Administered 2020-05-28: 2 mg via INTRAVENOUS
  Filled 2020-05-28: qty 1

## 2020-05-28 MED ORDER — SILDENAFIL CITRATE 20 MG PO TABS
20.0000 mg | ORAL_TABLET | Freq: Two times a day (BID) | ORAL | Status: DC
  Administered 2020-05-28 – 2020-06-03 (×12): 20 mg via ORAL
  Filled 2020-05-28 (×12): qty 1

## 2020-05-28 MED ORDER — SENNA 8.6 MG PO TABS
2.0000 | ORAL_TABLET | Freq: Two times a day (BID) | ORAL | Status: DC
  Administered 2020-05-28 – 2020-06-03 (×12): 17.2 mg via ORAL
  Filled 2020-05-28 (×12): qty 2

## 2020-05-28 MED ORDER — FINASTERIDE 5 MG PO TABS
5.0000 mg | ORAL_TABLET | Freq: Every day | ORAL | Status: DC
  Administered 2020-05-28 – 2020-06-02 (×6): 5 mg via ORAL
  Filled 2020-05-28 (×6): qty 1

## 2020-05-28 MED ORDER — GUAIFENESIN ER 600 MG PO TB12
600.0000 mg | ORAL_TABLET | Freq: Two times a day (BID) | ORAL | Status: DC
  Administered 2020-05-28 – 2020-06-03 (×13): 600 mg via ORAL
  Filled 2020-05-28 (×13): qty 1

## 2020-05-28 MED ORDER — MORPHINE (PF) INJECTION FOR INHALATION 10 MG/ML
10.0000 mg | Freq: Once | RESPIRATORY_TRACT | Status: AC
  Administered 2020-05-28: 10 mg via RESPIRATORY_TRACT
  Filled 2020-05-28: qty 1

## 2020-05-28 MED ORDER — IPRATROPIUM BROMIDE 0.02 % IN SOLN
0.5000 mg | Freq: Once | RESPIRATORY_TRACT | Status: AC
  Administered 2020-05-28: 0.5 mg via RESPIRATORY_TRACT
  Filled 2020-05-28: qty 2.5

## 2020-05-28 MED ORDER — METOCLOPRAMIDE HCL 10 MG PO TABS
10.0000 mg | ORAL_TABLET | Freq: Three times a day (TID) | ORAL | Status: DC
  Administered 2020-05-29 – 2020-06-03 (×16): 10 mg via ORAL
  Filled 2020-05-28 (×16): qty 1

## 2020-05-28 MED ORDER — BENZONATATE 100 MG PO CAPS
100.0000 mg | ORAL_CAPSULE | Freq: Once | ORAL | Status: AC
  Administered 2020-05-28: 100 mg via ORAL
  Filled 2020-05-28: qty 1

## 2020-05-28 MED ORDER — PROCHLORPERAZINE MALEATE 10 MG PO TABS
10.0000 mg | ORAL_TABLET | ORAL | Status: DC | PRN
  Filled 2020-05-28: qty 1

## 2020-05-28 MED ORDER — POLYETHYLENE GLYCOL 3350 17 G PO PACK
17.0000 g | PACK | Freq: Every day | ORAL | Status: DC
  Administered 2020-05-28 – 2020-06-02 (×6): 17 g via ORAL
  Filled 2020-05-28 (×6): qty 1

## 2020-05-28 MED ORDER — PANTOPRAZOLE SODIUM 40 MG PO TBEC
40.0000 mg | DELAYED_RELEASE_TABLET | Freq: Two times a day (BID) | ORAL | Status: DC
  Administered 2020-05-28 – 2020-06-03 (×12): 40 mg via ORAL
  Filled 2020-05-28 (×12): qty 1

## 2020-05-28 MED ORDER — MORPHINE SULFATE 10 MG/5ML PO SOLN: 2.5000 mg | ORAL | Status: DC | PRN

## 2020-05-28 MED ORDER — DILTIAZEM HCL-DEXTROSE 125-5 MG/125ML-% IV SOLN (PREMIX)
5.0000 mg/h | INTRAVENOUS | Status: DC
  Administered 2020-05-28: 5 mg/h via INTRAVENOUS
  Filled 2020-05-28: qty 125

## 2020-05-28 MED ORDER — AZITHROMYCIN 250 MG PO TABS
250.0000 mg | ORAL_TABLET | ORAL | Status: DC
  Administered 2020-05-29 – 2020-06-02 (×3): 250 mg via ORAL
  Filled 2020-05-28 (×5): qty 1

## 2020-05-28 MED ORDER — TEMAZEPAM 15 MG PO CAPS
30.0000 mg | ORAL_CAPSULE | Freq: Every day | ORAL | Status: DC
  Administered 2020-05-28 – 2020-06-02 (×6): 30 mg via ORAL
  Filled 2020-05-28 (×3): qty 2
  Filled 2020-05-28: qty 4
  Filled 2020-05-28 (×3): qty 2

## 2020-05-28 MED ORDER — DILTIAZEM HCL ER COATED BEADS 180 MG PO CP24
180.0000 mg | ORAL_CAPSULE | Freq: Every day | ORAL | Status: DC
  Administered 2020-05-28 – 2020-06-02 (×6): 180 mg via ORAL
  Filled 2020-05-28 (×6): qty 1

## 2020-05-28 MED ORDER — LORAZEPAM 2 MG/ML IJ SOLN
0.5000 mg | Freq: Once | INTRAMUSCULAR | Status: AC
  Administered 2020-05-28: 0.5 mg via INTRAVENOUS
  Filled 2020-05-28: qty 1

## 2020-05-28 MED ORDER — METHYLPREDNISOLONE SODIUM SUCC 125 MG IJ SOLR: 125.0000 mg | Freq: Once | INTRAMUSCULAR | Status: DC

## 2020-05-28 MED ORDER — ROSUVASTATIN CALCIUM 5 MG PO TABS
10.0000 mg | ORAL_TABLET | Freq: Every day | ORAL | Status: DC
  Administered 2020-05-28 – 2020-06-02 (×6): 10 mg via ORAL
  Filled 2020-05-28 (×6): qty 2

## 2020-05-28 MED ORDER — TAMSULOSIN HCL 0.4 MG PO CAPS
0.4000 mg | ORAL_CAPSULE | Freq: Every day | ORAL | Status: DC
  Administered 2020-05-29 – 2020-06-03 (×6): 0.4 mg via ORAL
  Filled 2020-05-28 (×6): qty 1

## 2020-05-28 MED ORDER — METHYLPREDNISOLONE SODIUM SUCC 125 MG IJ SOLR
60.0000 mg | Freq: Three times a day (TID) | INTRAMUSCULAR | Status: DC
  Administered 2020-05-28 – 2020-05-29 (×2): 60 mg via INTRAVENOUS
  Filled 2020-05-28 (×2): qty 2

## 2020-05-28 MED ORDER — IPRATROPIUM-ALBUTEROL 0.5-2.5 (3) MG/3ML IN SOLN
3.0000 mL | Freq: Four times a day (QID) | RESPIRATORY_TRACT | Status: DC
  Administered 2020-05-28 – 2020-05-29 (×3): 3 mL via RESPIRATORY_TRACT
  Filled 2020-05-28 (×4): qty 3

## 2020-05-28 MED ORDER — FUROSEMIDE 40 MG PO TABS
40.0000 mg | ORAL_TABLET | ORAL | Status: DC
  Administered 2020-05-29: 40 mg via ORAL
  Filled 2020-05-28: qty 1

## 2020-05-28 MED ORDER — MORPHINE SULFATE 10 MG/5ML PO SOLN
5.0000 mg | ORAL | Status: DC | PRN
  Administered 2020-05-30: 5 mg via ORAL
  Filled 2020-05-28 (×2): qty 4

## 2020-05-28 NOTE — ED Notes (Signed)
EKG preformed and given to Dr. Tamala Julian (admitting) and Dr. Jeanell Sparrow (ED). No additional orders at this time. Pt resting on side comfortably.

## 2020-05-28 NOTE — ED Provider Notes (Signed)
[facility] EMERGENCY DEPARTMENT Provider Note   CSN: [id number] Arrival date & time: 05/28/20  1313     History Chief Complaint  Patient presents with  . Shortness of Breath    Andrew [name] is a 79 y.o. male.  The history is provided by the patient, medical records and the EMS personnel. No language interpreter was used.  Shortness of Breath  Andrew [name] is a 79 y.o. male who presents to the Emergency Department complaining of shortness of breath. He presents the emergency department by EMS for evaluation of increased shortness of breath over the last three days. 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 He is on 3 L nasal cannula at baseline. He has associated abdominal bloating and lower extremity edema. He lives at home with his daughter. EMS administered 10 mg of at pewter all, 1 mg Atrovent, 125 mg Solu-Medrol prior to arrival. He took .5 mg of Ativan and 50 mg of morphine prior to leaving the house. He takes Lasix three times a week.  Level V caveat due to respiratory distress.      Past Medical History:  Diagnosis Date  . Anemia   . BiPAP (biphasic positive airway pressure) dependence    Pt denies history of OSA  . BPH (benign prostatic hyperplasia)   . Cancer ([place])    skin  . COPD (chronic obstructive pulmonary disease) ([place])   . Dysphagia   . Emphysema lung ([place])   . GERD (gastroesophageal reflux disease)    " Silent reflux"  . Hearing loss    right ear  . History of hiatal hernia   . HLD (hyperlipidemia)   . Hypertension   . Oxygen dependent    2-3 liters  . Oxygen dependent   . Pneumonia   . Pulmonary hypertension ([place])   . Sleep apnea    wears cpap  . TIA (transient ischemic attack) 04/27/2020    Patient Active Problem List   Diagnosis Date Noted  . TIA (transient ischemic attack) 04/27/2020  . Hospice care patient 03/27/2020  . Palliative care patient 03/27/2020  . Abdominal distention 02/10/2020  . Chronic  anticoagulation 09/22/2019  . Healthcare maintenance 07/20/2019  . Immunosuppressed status ([place]) 12/03/2018  . Current chronic use of systemic steroids 12/03/2018  . Iron deficiency anemia, unspecified 04/20/2018  . PVCs (premature ventricular contractions) 02/02/2018  . Pressure injury of skin 01/18/2018  . Influenza-like illness 01/17/2018  . History of pneumonia 12/16/2017  . OSA on CPAP 09/22/2017  . Paroxysmal atrial fibrillation ([facility])   . Malnutrition of moderate degree 09/10/2017  . Protein-calorie malnutrition, severe 12/09/2016  . Dysphagia 08/23/2016  . Normocytic anemia 06/28/2016  . Pressure sore on buttocks 06/28/2016  . Pulmonary hypertension ([place]) 02/20/2016  . GERD (gastroesophageal reflux disease) 02/20/2016  . COPD with acute exacerbation ([place]) 02/13/2016  . Stage 3 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 ([place]) 02/11/2016  . Hypertension 02/11/2016  . Hyperlipidemia 02/11/2016  . BPH (benign prostatic hyperplasia) 12/29/2015  . Chronic hypoxemic respiratory failure ([place]) 10/02/2015  . History of pulmonary hypertension 10/02/2015    Past Surgical History:  Procedure Laterality Date  . CARDIAC CATHETERIZATION    . CATARACT EXTRACTION W/ INTRAOCULAR LENS  IMPLANT, BILATERAL    . COLONOSCOPY WITH PROPOFOL N/A 04/20/2018   Procedure: COLONOSCOPY WITH PROPOFOL;  Surgeon: Wilford Corner, MD;  Location: [facility];  Service: Endoscopy;  Laterality: N/A;  . ESOPHAGOGASTRODUODENOSCOPY (EGD) WITH PROPOFOL N/A 08/23/2016   Procedure: ESOPHAGOGASTRODUODENOSCOPY (EGD)  WITH PROPOFOL;  Surgeon: Wilford Corner, MD;  Location: [facility] ENDOSCOPY;  Service: Endoscopy;  Laterality: N/A;  . ESOPHAGOGASTRODUODENOSCOPY (EGD) WITH PROPOFOL N/A 04/20/2018   Procedure: ESOPHAGOGASTRODUODENOSCOPY (EGD) WITH PROPOFOL;  Surgeon: Wilford Corner, MD;  Location: [facility];  Service: Endoscopy;  Laterality: N/A;  . HOT HEMOSTASIS N/A 04/20/2018   Procedure: HOT HEMOSTASIS (ARGON PLASMA  COAGULATION/BICAP);  Surgeon: Wilford Corner, MD;  Location: [facility];  Service: Endoscopy;  Laterality: N/A;  . LUNG SURGERY    . POLYPECTOMY  04/20/2018   Procedure: POLYPECTOMY;  Surgeon: Wilford Corner, MD;  Location: [facility] ENDOSCOPY;  Service: Endoscopy;;  . TONSILLECTOMY         Family History  Problem Relation Age of Onset  . Cancer Maternal Uncle   . Pancreatitis Mother   . Bone cancer Brother   . Stroke Maternal Grandfather   . Congestive Heart Failure Maternal Grandfather     Social History   Tobacco Use  . Smoking status: Former Smoker    Packs/day: 0.00    Years: 0.00    Pack years: 0.00    Types: Cigarettes    Quit date: 10/01/1984    Years since quitting: 35.6  . Smokeless tobacco: Never Used  Vaping Use  . Vaping Use: Never used  Substance Use Topics  . Alcohol use: No    Alcohol/week: 0.0 standard drinks  . Drug use: No    Home Medications Prior to Admission medications   Medication Sig Start Date End Date Taking? Authorizing Provider  albuterol (PROVENTIL HFA;VENTOLIN HFA) 108 (90 Base) MCG/ACT inhaler Inhale 2 puffs into the lungs every 6 (six) hours as needed for wheezing or shortness of breath. 04/17/17   Noralee Space, MD  aspirin EC 81 MG tablet Take 81 mg by mouth daily.    [provider]  diltiazem (CARDIZEM CD) 180 MG 24 hr capsule TAKE 1 CAPSULE EVERY DAY (NEEDS OFFICE VISIT FOR FURTHER REFILLS) 02/15/20   Skeet Latch, MD  finasteride (PROSCAR) 5 MG tablet Take 5 mg by mouth at bedtime.     [provider]  fluticasone (FLONASE) 50 MCG/ACT nasal spray Place 1 spray into both nostrils at bedtime.  06/24/17   [provider]  furosemide (LASIX) 40 MG tablet Take 40 mg by mouth as directed. TAKE 1 TABLET Monday, Wednesday, AND Friday ONLY    [provider]  guaiFENesin (MUCINEX) 600 MG 12 hr tablet Take 1 tablet (600 mg total) by mouth 2 (two) times daily. 09/12/17   Lavina Hamman, MD  hydrocortisone  cream 1 % Apply 1 application topically daily as needed (Eczema on face and head).    [provider]  ipratropium-albuterol (DUONEB) 0.5-2.5 (3) MG/3ML SOLN Take 3 mLs by nebulization every 6 (six) hours as needed. 03/15/20   Icard, Octavio Graves, DO  levalbuterol (XOPENEX) 0.63 MG/3ML nebulizer solution Take 3 mLs (0.63 mg total) by nebulization every 4 (four) hours as needed for wheezing or shortness of breath. 05/26/20   Martyn Ehrich, NP  Multiple Vitamins-Minerals (MULTIVITAMIN WITH MINERALS) tablet Take 1 tablet by mouth daily.    [provider]  omeprazole (PRILOSEC) 40 MG capsule Take 40 mg by mouth 2 (two) times daily.     [provider]  OXYGEN Inhale 2-3 L into the lungs See admin instructions. 2L daytime, 3L at night     [provider]  polyethylene glycol (MIRALAX / GLYCOLAX) packet Take 17 g by mouth daily. Patient taking differently: Take 17 g  by mouth at bedtime. Mix in 8 oz liquid and drink 05/08/16   Mesner, Corene Cornea, MD  potassium chloride 20 MEQ TBCR Take 20 mEq by mouth daily. 04/05/20   Duke, Tami Lin, PA  predniSONE (DELTASONE) 10 MG tablet TAKE 1 TABLET BY MOUTH DAILY WITH BREAKFAST 12/22/19   Icard, Bradley L, DO  rosuvastatin (CRESTOR) 10 MG tablet Take 10 mg by mouth daily.    [provider]  sildenafil (REVATIO) 20 MG tablet Take 1 tablet (20 mg total) by mouth 2 (two) times daily. 03/12/19   Fenton Foy, NP  Spacer/Aero-Holding Chambers (AEROCHAMBER MV) inhaler Use as instructed 12/03/18   Icard, Octavio Graves, DO  tamsulosin (FLOMAX) 0.4 MG CAPS capsule Take 1 capsule (0.4 mg total) by mouth daily after breakfast. 01/03/16   Nita Sells, MD  temazepam (RESTORIL) 30 MG capsule Take 1 capsule (30 mg total) by mouth at bedtime. 07/09/18   Noralee Space, MD  vitamin C (ASCORBIC ACID) 500 MG tablet Take 500 mg by mouth 2 (two) times daily.     [provider]  VITAMIN E PO Take by mouth daily.    [provider]    Allergies    Patient has no known allergies.  Review of Systems   Review of Systems  Respiratory: Positive for shortness of breath.   All other systems reviewed and are negative.   Physical Exam Updated Vital Signs BP (!) 147/71 (BP Location: Right Arm)   Pulse 100   Temp 98.3 F (36.8 C) (Oral)   Resp (!) 22   SpO2 99% Comment: Neb treatment  Physical Exam Vitals and nursing note reviewed.  Constitutional:      Appearance: He is well-developed.  HENT:     Head: Normocephalic and atraumatic.  Cardiovascular:     Rate and Rhythm: Normal rate and regular rhythm.     Heart sounds: No murmur heard.   Pulmonary:     Effort: Respiratory distress present.     Comments: Increased work of breathing with decreased air movement bilaterally. Occasional end expiratory wheezes. Abdominal:     Palpations: Abdomen is soft.     Tenderness: There is no abdominal tenderness. There is no guarding or rebound.  Musculoskeletal:        General: No tenderness.     Comments: 1+ edema to BLE  Skin:    General: Skin is warm and dry.  Neurological:     Mental Status: He is alert and oriented to person, place, and time.  Psychiatric:        Behavior: Behavior normal.     ED Results / Procedures / Treatments   Labs (all labs ordered are listed, but only abnormal results are displayed) Labs Reviewed  BASIC METABOLIC PANEL  CBC WITH DIFFERENTIAL/PLATELET    EKG None  Radiology No results found.  Procedures Procedures (including critical care time) CRITICAL CARE Performed by: Quintella Reichert   Total critical care time: 45 minutes  Critical care time was exclusive of separately billable procedures and treating other patients.  Critical care was necessary to treat or prevent imminent or life-threatening deterioration.  Critical care was time spent personally by me on the following activities: development of treatment plan with patient and/or surrogate as well  as nursing, discussions with consultants, evaluation of patient's response to treatment, examination of patient, obtaining history from patient or surrogate, ordering and performing treatments and interventions, ordering and review of laboratory studies, ordering and review of radiographic studies, pulse  oximetry and re-evaluation of patient's condition.  Medications Ordered in ED Medications  albuterol (PROVENTIL,VENTOLIN) solution continuous neb (has no administration in time range)  ipratropium (ATROVENT) nebulizer solution 0.5 mg (has no administration in time range)  methylPREDNISolone sodium succinate (SOLU-MEDROL) 125 mg/2 mL injection 125 mg (has no administration in time range)  morphine 2 MG/ML injection 2 mg (has no administration in time range)    ED Course  I have reviewed the triage vital signs and the nursing notes.  Pertinent labs & imaging results that were available during my care of the patient were reviewed by me and considered in my medical decision making (see chart for details).    MDM Rules/Calculators/A&P                         Patient with history of COPD on hospice here for evaluation of increased shortness of breath and abdominal discomfort with breathing. Patient in respiratory distress on ED arrival with increased work of breathing accessory muscle use. He was treated with albuterol with no significant improvement in symptoms. He was given morphine for air hunger with mild improvement in symptoms. He was also treated with morphine neb, Ativan. He did require multiple doses of morphine. Given inability to control his symptoms plan to admit for more symptom management.  Final Clinical Impression(s) / ED Diagnoses Final diagnoses:  None    Rx / DC Orders ED Discharge Orders    None       Quintella Reichert, MD 05/28/20 [phone]

## 2020-05-28 NOTE — ED Notes (Signed)
Report called to Production assistant, radio on [employer]

## 2020-05-28 NOTE — Consult Note (Signed)
Palliative Medicine Inpatient Consult Note  Reason for consult:  Goals of care, prognosis  HPI:  Per intake H&P --> Andrew [name] is a 79 y.o. male who presents to the Emergency Department complaining of shortness of breath. He presents the emergency department by EMS for evaluation of increased shortness of breath over the last three days. 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 He is on 3 L nasal cannula at baseline. He has associated abdominal bloating and lower extremity edema. He lives at home with his daughter. EMS administered 10 mg of at pewter all, 1 mg Atrovent, 125 mg Solu-Medrol prior to arrival. He took .5 mg of Ativan and 50 mg of morphine prior to leaving the house. He takes Lasix three times a week.  Palliative care was asked to consult to better identify goals of care as they relate to patients prognosis. Plan to help with symptom relief as well.   Clinical Assessment/Goals of Care: I have reviewed medical records including EPIC notes, labs and imaging, received report from bedside RN, assessed the patient.    I met with Andrew [name] to further discuss diagnosis prognosis, GOC, EOL wishes, disposition and options.   I introduced Palliative Medicine as specialized medical care for people living with serious illness. It focuses on providing relief from the symptoms and stress of a serious illness. The goal is to improve quality of life for both the patient and the family.  Andrew [name] expressed that he is on hospice through [facility] at home. He shares that his shortness of breath got so bad that he had to come into the hospital. He expresses since being here he is feeling improved.   I asked Andrew [name] to tell me about himself. He shares that he is from [place] originally. He has been married twice and has five children, three boys from his first marriage and two girls from his second marriage. He has a finace, [place] who he met at church. He shares that they get  enjoyment out of going out to eat once a week. He use to work as an Merchandiser, retail. He is a strong believer in God and is of the [employer]. He loves automobiles and has a classic hudson and [employer] at his home.   Andrew [name] shares that at home he is able to do most things for himself. He is able to mobilize through "furniture/wall walking" he has a walker but does not often use it. He is on 3LPM of O2 at home which he is able to get around with. He was driving until his most recent exacerbation. He feels that at the present time he still has a reasonably good quality of life. He shares with me that "he's not done yet" and would like his symptoms optimized as best as possible to enable him more time with his fiance, Festus Holts. He shares that he knows, "my health is going to hell in a hand basket."   In terms of advanced care planning he would rely on his daughter, Cecille Rubin to make decisions for him.  MOST completed and placed on file:  Cardiopulmonary Resuscitation: Do Not Attempt Resuscitation (DNR/No CPR)  Medical Interventions: Limited Additional Interventions: Use medical treatment, IV fluids and cardiac monitoring as indicated, DO NOT USE intubation or mechanical ventilation. May consider use of less invasive airway support such as BiPAP or CPAP. Also provide comfort measures. Transfer to the hospital if indicated. Avoid intensive care.   Antibiotics: Antibiotics if indicated  IV  Fluids: IV fluids for a defined trial period  Feeding Tube: No feeding tube   Discussed the importance of continued conversation with family and their  medical providers regarding overall plan of care and treatment options, ensuring decisions are within the context of the patients values and GOCs.  Provided "Hard Choices for Loving People" booklet  Decision Maker: Patient is able to make decisions for himself though if he were unable to he would rely on his daughter, Cecille Rubin to make decisions for him.  SUMMARY OF  RECOMMENDATIONS   DNAR/DNI --> MOST updated and placed in media section of chart. Patient has DNR with him.  Continue to optimize symptom management  Plan for transition back home with [facility] hospice when stabilized, he is at this moment not appropriate for [facility]    Code Status/Advance Care Planning: DNAR/DNI   Symptom Management:  End Stage COPD: Dyspnea: - Agree with duonebs QID - Agree with solumedrol 28m IV Q8H with the plan to transition to orals once more stable - Morphine 576mQ3H PRN  - Continue mucinex 60049mO BID   Palliative Prophylaxis:   Oral care, Constipation, Mobility  Additional Recommendations (Limitations, Scope, Preferences):  Continue current scope of care   Psycho-social/Spiritual:   Desire for further Chaplaincy support: Yes  Additional Recommendations: Education on symptom management   Prognosis: Poor, likely weeks to months  Discharge Planning: Discharge to home with [facility] hospice  PPS: 60%    Time In: 1615 Time Out: 1725 Total Time: 70 Greater than 50%  of this time was spent counseling and coordinating care related to the above assessment and plan.  [facility] Team Cell Phone: [phone] utilize secure chat with additional questions, if there is no response within 30 minutes please call the above phone number  Palliative Medicine Team providers are available by phone from 7am to 7pm daily and can be reached through the team cell phone.  Should this patient require assistance outside of these hours, please call the patient's attending physician.

## 2020-05-28 NOTE — H&P (Addendum)
History and Physical    Andrew [name] NTI:[id number] DOB: 05-11-1941 DOA: 05/28/2020  Referring MD/NP/PA: Quintella Reichert, MD PCP: Lujean Amel, MD  Patient coming from: Home via EMS  Chief Complaint: Shortness of breath  I have personally briefly reviewed patient's old medical records in [place]   HPI: Andrew [name] is a 79 y.o. 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  Patient notes that his breathing had been declining over the last 2 months and he had been on hospice at home.  Normally he was on 2 to 3 L of oxygen, but in the last few days had been requiring 3-4.  EMS was called due to his increased work of breathing despite utilizing his home inhalers.  Patient brings with him his DNR form as well as his MOST form.  He confirms that he would not like to remain do not resuscitated if he were to pass away during this hospitalization, but was okay with full scope of treatment including possible need for intubation.  However, declined use of feeding tubes.  He notes that he quit smoking over 30+ years ago.  Ultimately patient would like to receive treatment and attempts to get back home on hospice.  In route with EMS patient had been given 125 mg of Solu-Medrol IV, 10 mg of albuterol, and 1 mg of Atrovent.  ED Course: Upon admission into the emergency department patient was seen to be mildly tachypneic with O2 saturations maintained on 10 L of high flow nasal cannula oxygen.  Labs appeared relatively near patient's baseline.  Chest x-ray showed hyperinflation without signs of infiltrate.  Patient had been given Mucinex, Tessalon Perles, morphine, and  breathing treatments.  Attempts have been placed to get the patient to [facility] place, but there was no bed availability for today.  Consults have been  made to care.  TR[facility] called to admit.  Review of Systems  Constitutional: Positive for malaise/fatigue. Negative for fever.  HENT: Negative for nosebleeds.   Eyes: Negative for pain.  Respiratory: Positive for shortness of breath and wheezing.   Cardiovascular: Positive for leg swelling. Negative for chest pain.  Gastrointestinal: Positive for abdominal pain. Negative for nausea and vomiting.  Genitourinary: Negative for dysuria and hematuria.  Musculoskeletal: Negative for falls.  Skin: Negative for itching and rash.  Neurological: Negative for focal weakness and loss of consciousness.  Psychiatric/Behavioral: Negative for substance abuse.    Past Medical History:  Diagnosis Date  . Anemia   . BiPAP (biphasic positive airway pressure) dependence    Pt denies history of OSA  . BPH (benign prostatic hyperplasia)   . Cancer ([place])    skin  . COPD (chronic obstructive pulmonary disease) ([place])   . Dysphagia   . Emphysema lung ([place])   . GERD (gastroesophageal reflux disease)    " Silent reflux"  . Hearing loss    right ear  . History of hiatal hernia   . HLD (hyperlipidemia)   . Hypertension   . Oxygen dependent    2-3 liters  . Oxygen dependent   . Pneumonia   . Pulmonary hypertension ([place])   . Sleep apnea    wears cpap  . TIA (transient ischemic attack) 04/27/2020    Past Surgical History:  Procedure Laterality Date  . CARDIAC CATHETERIZATION    . CATARACT EXTRACTION W/ INTRAOCULAR LENS  IMPLANT, BILATERAL    . COLONOSCOPY WITH PROPOFOL N/A 04/20/2018   Procedure: COLONOSCOPY WITH PROPOFOL;  Surgeon: Wilford Corner, MD;  Location: [place];  Service: Endoscopy;  Laterality: N/A;  . ESOPHAGOGASTRODUODENOSCOPY (EGD) WITH PROPOFOL N/A 08/23/2016   Procedure: ESOPHAGOGASTRODUODENOSCOPY (EGD) WITH PROPOFOL;  Surgeon: Wilford Corner, MD;  Location[facility] ENDOSCOPY;  Service: Endoscopy;  Laterality: N/A;  . ESOPHAGOGASTRODUODENOSCOPY (EGD) WITH PROPOFOL N/A 04/20/2018    Procedure: ESOPHAGOGASTRODUODENOSCOPY (EGD) WITH PROPOFOL;  Surgeon: Wilford Corner, MD;  Location: [place];  Service: Endoscopy;  Laterality: N/A;  . HOT HEMOSTASIS N/A 04/20/2018   Procedure: HOT HEMOSTASIS (ARGON PLASMA COAGULATION/BICAP);  Surgeon: Wilford Corner, MD;  Location: [place];  Service: Endoscopy;  Laterality: N/A;  . LUNG SURGERY    . POLYPECTOMY  04/20/2018   Procedure: POLYPECTOMY;  Surgeon: Wilford Corner, MD;  Location: [facility] ENDOSCOPY;  Service: Endoscopy;;  . TONSILLECTOMY       reports that he quit smoking about 35 years ago. His smoking use included cigarettes. He smoked 0.00 packs per day for 0.00 years. He has never used smokeless tobacco. He reports that he does not drink alcohol and does not use drugs.  No Known Allergies  Family History  Problem Relation Age of Onset  . Cancer Maternal Uncle   . Pancreatitis Mother   . Bone cancer Brother   . Stroke Maternal Grandfather   . Congestive Heart Failure Maternal Grandfather     Prior to Admission medications   Medication Sig Start Date End Date Taking? Authorizing Provider  albuterol (PROVENTIL HFA;VENTOLIN HFA) 108 (90 Base) MCG/ACT inhaler Inhale 2 puffs into the lungs every 6 (six) hours as needed for wheezing or shortness of breath. 04/17/17   Noralee Space, MD  aspirin EC 81 MG tablet Take 81 mg by mouth daily.    [provider]  diltiazem (CARDIZEM CD) 180 MG 24 hr capsule TAKE 1 CAPSULE EVERY DAY (NEEDS OFFICE VISIT FOR FURTHER REFILLS) 02/15/20   Skeet Latch, MD  finasteride (PROSCAR) 5 MG tablet Take 5 mg by mouth at bedtime.     [provider]  fluticasone (FLONASE) 50 MCG/ACT nasal spray Place 1 spray into both nostrils at bedtime.  06/24/17   [provider]  furosemide (LASIX) 40 MG tablet Take 40 mg by mouth as directed. TAKE 1 TABLET Monday, Wednesday, AND Friday ONLY    [provider]  guaiFENesin (MUCINEX) 600 MG 12 hr tablet Take 1 tablet  (600 mg total) by mouth 2 (two) times daily. 09/12/17   Lavina Hamman, MD  hydrocortisone cream 1 % Apply 1 application topically daily as needed (Eczema on face and head).    [provider]  ipratropium-albuterol (DUONEB) 0.5-2.5 (3) MG/3ML SOLN Take 3 mLs by nebulization every 6 (six) hours as needed. 03/15/20   Icard, Octavio Graves, DO  levalbuterol (XOPENEX) 0.63 MG/3ML nebulizer solution Take 3 mLs (0.63 mg total) by nebulization every 4 (four) hours as needed for wheezing or shortness of breath. 05/26/20   Martyn Ehrich, NP  Multiple Vitamins-Minerals (MULTIVITAMIN WITH MINERALS) tablet Take 1 tablet by mouth daily.    [provider]  omeprazole (PRILOSEC) 40 MG capsule Take 40 mg by mouth 2 (two) times daily.     [provider]  OXYGEN Inhale 2-3 L into the lungs See admin instructions. 2L daytime, 3L at night     [provider]  polyethylene glycol (MIRALAX / GLYCOLAX) packet Take 17 g by mouth daily. Patient taking differently:  Take 17 g by mouth at bedtime. Mix in 8 oz liquid and drink 05/08/16   Mesner, Corene Cornea, MD  potassium chloride 20 MEQ TBCR Take 20 mEq by mouth daily. 04/05/20   Duke, Tami Lin, PA  predniSONE (DELTASONE) 10 MG tablet TAKE 1 TABLET BY MOUTH DAILY WITH BREAKFAST 12/22/19   Icard, Bradley L, DO  rosuvastatin (CRESTOR) 10 MG tablet Take 10 mg by mouth daily.    [provider]  sildenafil (REVATIO) 20 MG tablet Take 1 tablet (20 mg total) by mouth 2 (two) times daily. 03/12/19   Fenton Foy, NP  Spacer/Aero-Holding Chambers (AEROCHAMBER MV) inhaler Use as instructed 12/03/18   Icard, Octavio Graves, DO  tamsulosin (FLOMAX) 0.4 MG CAPS capsule Take 1 capsule (0.4 mg total) by mouth daily after breakfast. 01/03/16   Nita Sells, MD  temazepam (RESTORIL) 30 MG capsule Take 1 capsule (30 mg total) by mouth at bedtime. 07/09/18   Noralee Space, MD  vitamin C (ASCORBIC ACID) 500 MG tablet Take 500 mg by mouth 2 (two) times  daily.     [provider]  VITAMIN E PO Take by mouth daily.    [provider]    Physical Exam:  Constitutional: NAD, calm, comfortable Vitals:   05/28/20 1316  BP: (!) 147/71  Pulse: 100  Resp: (!) 22  Temp: 98.3 F (36.8 C)  TempSrc: Oral  SpO2: 99%   Eyes: PERRL, lids and conjunctivae normal ENMT: Mucous membranes are moist. Posterior pharynx clear of any exudate or lesions.Normal dentition.  Neck: normal, supple, no masses, no thyromegaly Respiratory: Decreased overall aeration with expiratory wheeze appreciated.  Patient currently on high flow nasal cannula oxygen on 10 L able to speak  in shortened sentences. Cardiovascular: Tachycardic, no murmurs / rubs / gallops.  1+ lower extremity edema. 2+ pedal pulses. No carotid bruits.  Abdomen: no tenderness, no masses palpated. No hepatosplenomegaly. Bowel sounds positive.  Musculoskeletal: no clubbing / cyanosis. No joint deformity upper and lower extremities. Good ROM, no contractures. Normal muscle tone.  Skin: no rashes, lesions, ulcers. No induration Neurologic: CN 2-12 grossly intact. Sensation intact, DTR normal. Strength 5/5 in all 4.  Psychiatric: Normal judgment and insight. Alert and oriented x 3. Normal mood.     Labs on Admission: I have personally reviewed following labs and imaging studies  CBC: Recent Labs  Lab 05/28/20 1420  WBC 8.9  NEUTROABS 7.7  HGB 13.3  HCT 40.9  MCV 101.5*  PLT 761   Basic Metabolic Panel: Recent Labs  Lab 05/28/20 1420  NA 140  K 4.1  CL 100  CO2 27  GLUCOSE 152*  BUN 11  CREATININE 0.88  CALCIUM 9.2   GFR: CrCl cannot be calculated (Unknown ideal weight.). Liver Function Tests: No results for input(s): AST, ALT, ALKPHOS, BILITOT, PROT, ALBUMIN in the last 168 hours. No results for input(s): LIPASE, AMYLASE in the last 168 hours. No results for input(s): AMMONIA in the last 168 hours. Coagulation Profile: No results for input(s): INR, PROTIME  in the last 168 hours. Cardiac Enzymes: No results for input(s): CKTOTAL, CKMB, CKMBINDEX, TROPONINI in the last 168 hours. BNP (last 3 results) No results for input(s): PROBNP in the last 8760 hours. HbA1C: No results for input(s): HGBA1C in the last 72 hours. CBG: No results for input(s): GLUCAP in the last 168 hours. Lipid Profile: No results for input(s): CHOL, HDL, LDLCALC, TRIG, CHOLHDL, LDLDIRECT in the last 72 hours. Thyroid Function Tests: No results for  input(s): TSH, T4TOTAL, FREET4, T3FREE, THYROIDAB in the last 72 hours. Anemia Panel: No results for input(s): VITAMINB12, FOLATE, FERRITIN, TIBC, IRON, RETICCTPCT in the last 72 hours. Urine analysis:    Component Value Date/Time   COLORURINE STRAW (A) 04/26/2018 0330   APPEARANCEUR CLEAR 04/26/2018 0330   LABSPEC 1.008 04/26/2018 0330   PHURINE 6.0 04/26/2018 0330   GLUCOSEU NEGATIVE 04/26/2018 0330   HGBUR NEGATIVE 04/26/2018 0330   BILIRUBINUR NEGATIVE 04/26/2018 0330   KETONESUR 5 (A) 04/26/2018 0330   PROTEINUR NEGATIVE 04/26/2018 0330   NITRITE NEGATIVE 04/26/2018 0330   LEUKOCYTESUR NEGATIVE 04/26/2018 0330   Sepsis Labs: No results found for this or any previous visit (from the past 240 hour(s)).   Radiological Exams on Admission: DG Chest Port 1 View  Result Date: 05/28/2020 CLINICAL DATA:  shob EXAM: PORTABLE CHEST - 1 VIEW COMPARISON:  11/18/2019 FINDINGS: Attenuated bronchovascular markings in the upper lobes. Postop changes in the right mid lung and hilum. Some bronchovascular crowding in the lung bases. No focal infiltrate or overt edema. Heart size and mediastinal contours are within normal limits. No effusion.  No definite pneumothorax. Visualized bones unremarkable. IMPRESSION: COPD.  No acute cardiopulmonary disease. Electronically Signed   By: Lucrezia Europe M.D.   On: 05/28/2020 14:08    Chest x-ray: Independently reviewed.  COPD without acute infiltrate appreciated  Assessment/Plan End-stage COPD  with exacerbation Respiratory failure with hypoxia Chronic steroid use: Acute on chronic.  Patient presents with progressively worsening shortness of breath over the last 2 to 3 days despite use of home inhalers.  Currently requiring 10 L via high flow nasal cannula oxygen to maintain O2 saturations.  Patient baseline has previously been 2-3 L. -Admit to a progressive bed -Continuous pulse oximetry with nasal cannula oxygen -DuoNebs 4 times daily -Solu-Medrol 60 mg IV every 8 hours -Mucinex -Continued patient's home regimen -Morphine add stated per palliative care  A. fib with RVR on anticoagulation: Patient went into A. fib with RVR with heart rates into the 140s while in the emergency department.  He had been on diltiazem as well as Eliquis.  However patient reports that he ran out of his Eliquis 3 or 4 days ago and hospice does not pay for this medication. -Diltiazem drip IV for now -Transition back over to oral home dose of diltiazem when able -Restart Eliquis  BPH -Continue finasteride and tamsulosin  OSA on CPAP -Continue CPAP nightly  On hospice: Goals of care were discussed with the patient wants to remain DNR and receive  treatment in attempts to go back home with hospice Palliative care  -Appreciate palliative care services and recommendations  DNR: Present on admission  DVT prophylaxis: Lovenox Code Status: DNR Family Communication: Patient reports that he will update his daughter Disposition Plan: Patient was ultimately being discharged back home with hospice Consults called: Palliative care Admission status: Observation   Norval Morton MD [facility] Pager [phone]   If 7PM-7AM, please contact night-coverage [email or web address] Password [facility]  05/28/2020, 4:22 PM

## 2020-05-28 NOTE — ED Triage Notes (Signed)
Pt to triage via [facility] from home.  Pt on hospice.  Reports increased SOB today.  EMS administered Albuterol 10mg , Atrovent 1mg , Solu-medrol 125mg  PTA.  Pt arrived receiving neb treatment.  Took Ativan 0.5mg  and Morphine 15mg  (home meds) just prior to EMS leaving his house.  Reports abd pain.  Swelling to legs and abd that is worse over the past week.  Takes lasix 3 times per week.  Report received and EMS took pt straight to treatment room.

## 2020-05-28 NOTE — Significant Event (Addendum)
Rapid Response Event Note  Overview: Called by RT d/t pt c/o SOB, lungs wheezy. RT had just administered breathing tx.   Pt here for COPD exas. He is a DNR with plans to be d/c'd home with hospice when appropriate. He is on duonebs, solumedrol, morphine IR and ativan scheduled. He also has morphine prn for dyspnea.  Initial Focused Assessment: Pt sitting up in bed in mild distress, tachypneic. He is alert and oriented, c/o SOB. He has just had a breathing treatment. Lungs are tight/wheezy/diminished t/o. HR-103, BP-116/89, RR-29, SpO2-100% on 10 L HFNC.   Interventions: CPAP changed to bipap Plan of Care (if not transferred): Bipap tonight-pt is a DNR but is agreeable to this. Pt is getting scheduled ativan/morphine/solumedrol now. Continue to monitor. Call RRT if further assistance needed.  Event Summary:  Dr. Myna Hidalgo notified at 2130 Called: 2115 Arrived: 2120 Ended: 2140   Dillard Essex

## 2020-05-28 NOTE — Care Management (Signed)
Can have only one 30 day supply for eliquis. Will give card to patient as well as 10 a month card.  aslo look for discounts with good rx if he has already used his cards.

## 2020-05-28 NOTE — Progress Notes (Addendum)
[facility] Liaison note [facility])  [name] is a current hospice patient with a terminal diagnosis of COPD. Daughter activated EMS after the patient started complaining of SOB even with the use of inhalers. [facility] was contacted by ED physician about admission. Plan is to admit patient and consult palliative. Palliative has seen this patient and plan at this point is to admit for symptom management. [facility] will follow up daily. Unsure of admit time or room number at this time. This is a related admission per [facility] physician.   Left message with daughter [name] to update on plan and status of patient. [facility] will follow up in the morning.   Please feel free to call with any hospice related questions.  Clementeen Hoof, RN, BSN Hospice Liaison [facility] in [place]) [phone]

## 2020-05-29 DIAGNOSIS — R0600 Dyspnea, unspecified: Secondary | ICD-10-CM

## 2020-05-29 MED ORDER — METHYLPREDNISOLONE SODIUM SUCC 40 MG IJ SOLR
40.0000 mg | Freq: Two times a day (BID) | INTRAMUSCULAR | Status: DC
  Administered 2020-05-29 – 2020-06-02 (×8): 40 mg via INTRAVENOUS
  Filled 2020-05-29 (×8): qty 1

## 2020-05-29 MED ORDER — IPRATROPIUM-ALBUTEROL 0.5-2.5 (3) MG/3ML IN SOLN
3.0000 mL | Freq: Three times a day (TID) | RESPIRATORY_TRACT | Status: DC
  Administered 2020-05-30 – 2020-05-31 (×5): 3 mL via RESPIRATORY_TRACT
  Filled 2020-05-29 (×4): qty 3

## 2020-05-29 NOTE — Progress Notes (Signed)
[facility])  [name] is under hospice services with a terminal diagnosis of COPD per Dr. Gildardo Cranker.  On 7/11, he became increasingly SOB and called hospice. Hospice staff were to go see him in the home, but [name] felt that he could not wait and called EMS for further evaluation.  He is admitted for COPD exacerbation.  This is a related hospital admission per Dr. Karie Georges with [facility].  [name] required BiPAP initially, but now on HFNC @ 5 lpm--baseline Winter 3 lpm at home. He is feeling better and ready to start walking around in hopes of returning home soon.   V/S:  99.1 oral, BP 99/51, HR 66, RR 18, SPO2 94% on O2 via HFNC  5 lpm I&O: 261/ Labs:  Unremarkable Diagnostics:  CXR - Attenuated bronchovascular markings in the upper lobes. Postop changes in the right mid lung and hilum. Some bronchovascular crowding in the lung bases IVs/PRNs:  Ativan 0.5 mg PO x 5, solu-medrol 60 mg IV TID, morphine MSIR 15 mg q4h  Problem List: - COPD exacerbation - O2 weaning down closer to baseline, BiPAP PRN--currently off, feels better and closer to baseline for his breathing - active hospice patient - plan to continue with hospice support once he is medically optimized  GOC- clear, return home with hospice support D/C planning- return home once medically optimized Family - left message, pt alert and oriented and capable of discussing his care with them IDT- team updated  Once ready for d/c, if pt requires ambulance transportation, please use [facility] as they contract this service for our active hospice pts.  Transfer summary and med list to shadow chart.  Venia Carbon RN, BSN, [facility] Liaison

## 2020-05-29 NOTE — Plan of Care (Signed)
  Problem: Activity: Goal: Risk for activity intolerance will decrease Outcome: Progressing   Problem: Safety: Goal: Ability to remain free from injury will improve Outcome: Progressing   

## 2020-05-29 NOTE — Progress Notes (Signed)
PROGRESS NOTE    Andrew [name]   WNI:[id number]  DOB: 15-Nov-1941  DOA: 05/28/2020 PCP: Lujean Amel, MD   Brief Narrative:  Andrew [name] is a 79 y.o. 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  Patient notes that his breathing had been declining over the last 2 months and he had been on hospice at home.  Normally he was on 2 to 3 L of oxygen, but in the last few days had been requiring 3-4.  EMS was called due to his increased work of breathing despite utilizing his home inhalers.  Patient brings with him his DNR form as well as his MOST form.  He confirms that he would not like to remain do not resuscitated if he were to pass away during this hospitalization, but was okay with full scope of treatment including possible need for intubation.  However, declined use of feeding tubes.  He notes that he quit smoking over 30+ years ago.  Ultimately patient would like to receive treatment and attempts to get back home on hospice.  In route with EMS patient had been given 125 mg of Solu-Medrol IV, 10 mg of albuterol, and 1 mg of Atrovent.  ED Course: Upon admission into the emergency department patient was seen to be mildly tachypneic with O2 saturations maintained on 10 L of high flow nasal cannula oxygen.  Labs appeared relatively near patient's baseline.  Chest x-ray showed hyperinflation without signs of infiltrate.  Patient had been given Mucinex, Tessalon Perles, morphine, and  breathing treatments.  Attempts have been placed to get the patient to [facility] place, but there was no bed availability for today.  Consults have been made to care.  TRH called to admit.   Subjective: Feels much better today. Has a mild cough with clear sputum.   Assessment & Plan:   Principal Problem:   COPD exacerbation- Acute  on chronic respiratory failure with hypoxia    End stage COPD on 2-3 L home O2 - off BiPAP- has come rhonchi but no wheezing- cut back on IV steroids start to ambulate- use BiPAP PRN today- cont Azithromycin  Active Problems:      Paroxysmal atrial fibrillation with RVR - now in NSR- d/c Cardizem infusion- continue oral Cardizem    OSA on CPAP - cont CPAP for bedtime use    Chronic anticoagulation - cont Eliquis- he state hospice will no pay for it though  Time spent in minutes: 35 DVT prophylaxis: DNR Code Status: Full code Family Communication:  Disposition Plan:  Status is: Inpatient  Remains inpatient appropriate because:treating COPD exacerbation with IV steroids   Dispo: The patient is from: Home              Anticipated d/c is to: Home              Anticipated d/c date is: 1 day              Patient currently is not medically stable to d/c.      Consultants:   None Procedures:   None  Antimicrobials:  Anti-infectives (From admission, onward)   Start     Dose/Rate Route Frequency Ordered Stop   05/29/20 1000  azithromycin (ZITHROMAX) tablet 250 mg     Discontinue     250 mg Oral Every M-W-F 05/28/20 1806  Objective: Vitals:   05/29/20 0248 05/29/20 0401 05/29/20 0627 05/29/20 0837  BP: 131/75  129/62   Pulse: 88  84   Resp: 20  (!) 22   Temp:      TempSrc:      SpO2: 97%  97% 98%  Weight:  61.2 kg    Height:        Intake/Output Summary (Last 24 hours) at 05/29/2020 1213 Last data filed at 05/29/2020 1044 Gross per 24 hour  Intake 500.97 ml  Output 500 ml  Net 0.97 ml   Filed Weights   05/28/20 1850 05/29/20 0401  Weight: 61.8 kg 61.2 kg    Examination: General exam: Appears comfortable  HEENT: PERRLA, oral mucosa moist, no sclera icterus or thrush Respiratory system: rhonchi bilaterally- on 4 L O2 Cardiovascular system: S1 & S2 heard, RRR.   Gastrointestinal system: Abdomen soft, non-tender, nondistended. Normal bowel  sounds. Central nervous system: Alert and oriented. No focal neurological deficits. Extremities: No cyanosis, clubbing or edema Skin: No rashes or ulcers Psychiatry:  Mood & affect appropriate.   Data Reviewed: I have personally reviewed following labs and imaging studies  CBC: Recent Labs  Lab 05/28/20 1420  WBC 8.9  NEUTROABS 7.7  HGB 13.3  HCT 40.9  MCV 101.5*  PLT 622   Basic Metabolic Panel: Recent Labs  Lab 05/28/20 1420  NA 140  K 4.1  CL 100  CO2 27  GLUCOSE 152*  BUN 11  CREATININE 0.88  CALCIUM 9.2   GFR: Estimated Creatinine Clearance: 58 mL/min (by C-G formula based on SCr of 0.88 mg/dL). Liver Function Tests: No results for input(s): AST, ALT, ALKPHOS, BILITOT, PROT, ALBUMIN in the last 168 hours. No results for input(s): LIPASE, AMYLASE in the last 168 hours. No results for input(s): AMMONIA in the last 168 hours. Coagulation Profile: No results for input(s): INR, PROTIME in the last 168 hours. Cardiac Enzymes: No results for input(s): CKTOTAL, CKMB, CKMBINDEX, TROPONINI in the last 168 hours. BNP (last 3 results) No results for input(s): PROBNP in the last 8760 hours. HbA1C: No results for input(s): HGBA1C in the last 72 hours. CBG: No results for input(s): GLUCAP in the last 168 hours. Lipid Profile: No results for input(s): CHOL, HDL, LDLCALC, TRIG, CHOLHDL, LDLDIRECT in the last 72 hours. Thyroid Function Tests: No results for input(s): TSH, T4TOTAL, FREET4, T3FREE, THYROIDAB in the last 72 hours. Anemia Panel: No results for input(s): VITAMINB12, FOLATE, FERRITIN, TIBC, IRON, RETICCTPCT in the last 72 hours. Urine analysis:    Component Value Date/Time   COLORURINE STRAW (A) 04/26/2018 0330   APPEARANCEUR CLEAR 04/26/2018 0330   LABSPEC 1.008 04/26/2018 0330   PHURINE 6.0 04/26/2018 0330   GLUCOSEU NEGATIVE 04/26/2018 0330   HGBUR NEGATIVE 04/26/2018 0330   BILIRUBINUR NEGATIVE 04/26/2018 0330   KETONESUR 5 (A) 04/26/2018 0330    PROTEINUR NEGATIVE 04/26/2018 0330   NITRITE NEGATIVE 04/26/2018 0330   LEUKOCYTESUR NEGATIVE 04/26/2018 0330   Sepsis Labs: @LABRCNTIP (procalcitonin:4,lacticidven:4) ) Recent Results (from the past 240 hour(s))  SARS Coronavirus 2 by RT PCR (hospital order, performed in [facility] hospital lab) Nasopharyngeal Nasopharyngeal Swab     Status: None   Collection Time: 05/28/20  5:05 PM   Specimen: Nasopharyngeal Swab  Result Value Ref Range Status   SARS Coronavirus 2 NEGATIVE NEGATIVE Final    Comment: (NOTE) SARS-CoV-2 target nucleic acids are NOT DETECTED.  The SARS-CoV-2 RNA is generally detectable in upper and lower respiratory specimens during the acute phase of  infection. The lowest concentration of SARS-CoV-2 viral copies this assay can detect is 250 copies / mL. A negative result does not preclude SARS-CoV-2 infection and should not be used as the sole basis for treatment or other patient management decisions.  A negative result may occur with improper specimen collection / handling, submission of specimen other than nasopharyngeal swab, presence of viral mutation(s) within the areas targeted by this assay, and inadequate number of viral copies (<250 copies / mL). A negative result must be combined with clinical observations, patient history, and epidemiological information.  Fact Sheet for Patients:   StrictlyIdeas.no  Fact Sheet for Healthcare Providers: BankingDealers.co.za  This test is not yet approved or  cleared by the [place] FDA and has been authorized for detection and/or diagnosis of SARS-CoV-2 by FDA under an Emergency Use Authorization (EUA).  This EUA will remain in effect (meaning this test can be used) for the duration of the COVID-19 declaration under Section 564(b)(1) of the Act, 21 U.S.C. section 360bbb-3(b)(1), unless the authorization is terminated or revoked sooner.  Performed at [facility], [place] [address] [place], [place] [address]          Radiology Studies: DG Chest Port 1 View  Result Date: 05/28/2020 CLINICAL DATA:  shob EXAM: PORTABLE CHEST - 1 VIEW COMPARISON:  11/18/2019 FINDINGS: Attenuated bronchovascular markings in the upper lobes. Postop changes in the right mid lung and hilum. Some bronchovascular crowding in the lung bases. No focal infiltrate or overt edema. Heart size and mediastinal contours are within normal limits. No effusion.  No definite pneumothorax. Visualized bones unremarkable. IMPRESSION: COPD.  No acute cardiopulmonary disease. Electronically Signed   By: Lucrezia Europe M.D.   On: 05/28/2020 14:08      Scheduled Meds: . apixaban  5 mg Oral BID  . azithromycin  250 mg Oral Q M,W,F  . diltiazem  180 mg Oral QHS  . finasteride  5 mg Oral QHS  . furosemide  40 mg Oral Q M,W,F  . guaiFENesin  600 mg Oral BID  . ipratropium-albuterol  3 mL Nebulization QID  . LORazepam  0.5 mg Oral Q4H  . methylPREDNISolone (SOLU-MEDROL) injection  40 mg Intravenous Q12H  . metoCLOPramide  10 mg Oral TID AC  . morphine  15 mg Oral Q4H  . pantoprazole  40 mg Oral BID  . polyethylene glycol  17 g Oral QHS  . rosuvastatin  10 mg Oral QHS  . senna  2 tablet Oral BID  . sildenafil  20 mg Oral BID  . tamsulosin  0.4 mg Oral QPC breakfast  . temazepam  30 mg Oral QHS   Continuous Infusions:   LOS: 1 day      Debbe Odea, MD [facility] Pager: [email or web address] 05/29/2020, 12:13 PM

## 2020-05-29 NOTE — Progress Notes (Signed)
Pt requested off Bipap to get a break.  Pt placed 6L HFNC and Sp02 is 99% RR 14.  Bipap on Standby. Will continue to monitor.

## 2020-05-29 NOTE — Progress Notes (Signed)
Daily Progress Note   Patient Name: Andrew [name]       Date: 05/29/2020 DOB: June 28, 1941  Age: 79 y.o. MRN#: [id number] Attending Physician: Debbe Odea, MD Primary Care Physician: Lujean Amel, MD Admit Date: 05/28/2020  HPI:  Per intake H&P --> Anda Kraft a 79 y.o.malewho presents to the Emergency Department complaining of shortness of breath. He presents the emergency department by EMS for evaluation of increased shortness of breath over the last three days. 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 He is on 3 L nasal cannula at baseline. He has associated abdominal bloating and lower extremity edema. He lives at home with his daughter. EMS administered 10 mg of at pewter all, 1 mg Atrovent, 125 mg Solu-Medrol prior to arrival. He took .5 mg of Ativan and 50 mg of morphine prior to leaving the house. He takes Lasix three times a week.  Palliative care was asked to consult to better identify goals of care as they relate to patients prognosis. Plan to help with symptom relief as well.   Subjective: Patient was on bipap for several hours overnight for dyspnea. Reports he feels much better now. Currently on high flow O2 at 5L. Bedside RN is in the room and states the cardizem infusion has been turned off.  Patient does report his breathing is improved now compared to yesterday, but that he continues to have dyspnea with even minimal exertion. He states it takes "a long time to recover" from any exertion.    Length of Stay: 1  Current Medications: Scheduled Meds:  . apixaban  5 mg Oral BID  . azithromycin  250 mg Oral Q M,W,F  . diltiazem  180 mg Oral QHS  . finasteride  5 mg Oral QHS  . furosemide  40 mg Oral Q M,W,F  . guaiFENesin  600 mg Oral BID  .  ipratropium-albuterol  3 mL Nebulization QID  . LORazepam  0.5 mg Oral Q4H  . methylPREDNISolone (SOLU-MEDROL) injection  60 mg Intravenous Q8H  . metoCLOPramide  10 mg Oral TID AC  . morphine  15 mg Oral Q4H  . pantoprazole  40 mg Oral BID  . polyethylene glycol  17 g Oral QHS  . rosuvastatin  10 mg Oral QHS  . senna  2 tablet Oral BID  .  sildenafil  20 mg Oral BID  . tamsulosin  0.4 mg Oral QPC breakfast  . temazepam  30 mg Oral QHS    PRN Meds: levalbuterol, morphine, prochlorperazine  Physical Exam Vitals reviewed.  Constitutional:      General: He is not in acute distress.    Comments: Chronically ill-appearing  Pulmonary:     Effort: Pulmonary effort is normal.  Neurological:     Mental Status: He is alert and oriented to person, place, and time.             Vital Signs: BP 129/62   Pulse 84   Temp 98.2 F (36.8 C) (Oral)   Resp (!) 22   Ht 5\' 7"  (1.702 m)   Wt 61.2 kg Comment: scale b   SpO2 98%   BMI 21.14 kg/m  SpO2: SpO2: 98 % O2 Device: O2 Device: High Flow Nasal Cannula ([place]) O2 Flow Rate: O2 Flow Rate (L/min): 6 L/min       Palliative Assessment/Data: 50%     Palliative Care Assessment & Plan   Recommendations/Plan:  Continue to optimize symptom management  Plan for transition back home with [facility] hospice when stable  Symptom Management:   Continue Duonebs QID  Continue levalbuterol nebs prn  Continue Scheduled Morphine (MSIR) 15 mg every 4 hours  Continue Lorazepam (ATIVAN) 0.5 mg every 4 hours  Continue Morphine 5 mg every 3 hours prn for dyspnea  Scope of treatment (per MOST form completed 05/28/20):   Cardiopulmonary Resuscitation: Do Not Attempt Resuscitation (DNR/No CPR)  Medical Interventions: Limited Additional Interventions: Use medical treatment, IV fluids and cardiac monitoring as indicated. DO NOT USE intubation or mechanical ventilation. May consider use of less invasive airway support such as BiPAP or CPAP.  Also provide comfort measures. Transfer to the hospital if indicated. Avoid intensive care.   Antibiotics: - Antibiotics if indicated  IV Fluids: - IV fluids for a defined trial period  Feeding Tube: - No feeding tube     Code Status: DNR/DNI  Prognosis:   weeks to months  Discharge Planning:  Discharge home with [facility]   Thank you for allowing the Palliative Medicine Team to assist in the care of this patient.   Total Time 15 minutes Prolonged Time Billed  no       Greater than 50%  of this time was spent counseling and coordinating care related to the above assessment and plan.  Lavena Bullion, NP  Please contact Palliative Medicine Team phone at [phone] for questions and concerns.

## 2020-05-30 LAB — CBC
HCT: 35.4 % — ABNORMAL LOW (ref 39.0–52.0)
Hemoglobin: 11.6 g/dL — ABNORMAL LOW (ref 13.0–17.0)
MCH: 33.6 pg (ref 26.0–34.0)
MCHC: 32.8 g/dL (ref 30.0–36.0)
MCV: 102.6 fL — ABNORMAL HIGH (ref 80.0–100.0)
Platelets: 204 10*3/uL (ref 150–400)
RBC: 3.45 MIL/uL — ABNORMAL LOW (ref 4.22–5.81)
RDW: 12.6 % (ref 11.5–15.5)
WBC: 14 10*3/uL — ABNORMAL HIGH (ref 4.0–10.5)
nRBC: 0 % (ref 0.0–0.2)

## 2020-05-30 LAB — BASIC METABOLIC PANEL
Anion gap: 10 (ref 5–15)
BUN: 22 mg/dL (ref 8–23)
CO2: 31 mmol/L (ref 22–32)
Calcium: 9.3 mg/dL (ref 8.9–10.3)
Chloride: 99 mmol/L (ref 98–111)
Creatinine, Ser: 0.9 mg/dL (ref 0.61–1.24)
GFR calc Af Amer: >60 mL/min (ref >60)
GFR calc non Af Amer: >60 mL/min (ref >60)
Glucose, Bld: 144 mg/dL — ABNORMAL HIGH (ref 70–99)
Potassium: 4.7 mmol/L (ref 3.5–5.1)
Sodium: 140 mmol/L (ref 135–145)

## 2020-05-30 MED ORDER — ACETYLCYSTEINE 20 % IN SOLN
600.0000 mg | Freq: Two times a day (BID) | RESPIRATORY_TRACT | Status: AC
  Administered 2020-05-31 (×2): 600 mg via ORAL
  Filled 2020-05-30 (×4): qty 4

## 2020-05-30 NOTE — Progress Notes (Signed)
[facility] Liaison Note    Patient is under [facility] hospice services with a terminal diagnosis of COPD. This a related hospital admission per Dr. Karie Georges, [facility] physician.  Patient began experiencing increased SOB on 05/28/20 and elected to call EMS to transport for evaluation in [facility] ED instead of having a [facility] hospice RN visit in the home.   He is admitted with for COPD exacerbation.  Patient is a DNR.   Checked in with bedside RN, Patient had a restful night on his BiPAP but has struggled with SOB at rest today having difficulty with meals/conversation.  Patient has also worn his BiPAP intermittently today to help ease anxiety/discomfort with his periods of dyspnea.  RN had just administered additional MS for comfort and called RT to support.  Visited patient at bedside, who had eyes closed with slightly labored breaths wearing BiPAP with HOB elevated fully. Patient did not respond to verbal stimuli so did not disturb to let him rest.   V/S:  98.2, 59, 10, 105/50, 95%  on O2 via HFNC 5 lpm I&O: 778.05/1474 (- 696.3) Abnormal lab: (05/30/20) WBC 14.0, RBC 3.45 , Hemo 11.6, HCT 35.4, Glucose 144 Diagnostics: CXR - Attenuated bronchovascular markings in the upper lobes. Postop changes in the right mid lung and hilum. Some bronchovascular crowding in the lung bases IVs/PRNs:  Ativan 0.5 mg PO q4hr, Duoneb 60ml TID adm at 0645 on 7/13,  Xopenex nebulizer 0.63mg  adm at 7/12 1821, solu-medrol 40 mg IV TID,  morphine MSIR 15 mg q4h, MS 10mg  prn PO at 1318     MD Problem List on Epic   Principal Problem:   COPD exacerbation- Acute on chronic respiratory failure with hypoxia    End stage COPD on 2-3 L home O2 and on hospice as outpatient - off BiPAP but has severe rhonchi, wheezing and cough today - will add Mucomyst and chest PT today - cont Solumedrol, Azithromycin and Neb treatments - at baseline O2 level Active Problems:     Paroxysmal atrial fibrillation with  RVR - now in NSR- d/c Cardizem infusion- continue oral Cardizem - cont Eliquis- he state hospice will no pay for it though   OSA on CPAP - cont CPAP for bedtime use   D/C planning- Return home with support of hospice once medically stable. Please use [facility] if transportation needed upon discharge as [facility] contracts for this service for our active patients. Goals of Care: Clear Communication with IDT- Updated [facility] team  Communication with [facility] - Spoke and supported daughter via telephone letting her know that the [facility] HLT will continue to follow patient daily during hospitalization.   Please call with any hospice related questions/concerns,   Gar Ponto, RN [place] (in [place]) [phone]

## 2020-05-30 NOTE — Progress Notes (Signed)
PROGRESS NOTE    [name]   PJK:[id number]  DOB: 07/20/1941  DOA: 05/28/2020 PCP: Lujean Amel, MD   Brief Narrative:  Andrew [name] is a 79 y.o. 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  Patient notes that his breathing had been declining over the last 2 months and he had been on hospice at home.  Normally he was on 2 to 3 L of oxygen, but in the last few days had been requiring 3-4.  EMS was called due to his increased work of breathing despite utilizing his home inhalers.  Patient brings with him his DNR form as well as his MOST form.  He confirms that he would not like to remain do not resuscitated if he were to pass away during this hospitalization, but was okay with full scope of treatment including possible need for intubation.  However, declined use of feeding tubes.  He notes that he quit smoking over 30+ years ago.  Ultimately patient would like to receive treatment and attempts to get back home on hospice.  In route with EMS patient had been given 125 mg of Solu-Medrol IV, 10 mg of albuterol, and 1 mg of Atrovent.  ED Course: Upon admission into the emergency department patient was seen to be mildly tachypneic with O2 saturations maintained on 10 L of high flow nasal cannula oxygen.  Labs appeared relatively near patient's baseline.  Chest x-ray showed hyperinflation without signs of infiltrate.  Patient had been given Mucinex, Tessalon Perles, morphine, and  breathing treatments.  Attempts have been placed to get the patient to [facility] place, but there was no bed availability for today.  Consults have been made to care.  TRH called to admit.   Subjective: Shortness of breath and cough have not improved since yesterday.   Assessment & Plan:   Principal Problem:   COPD exacerbation-  Acute on chronic respiratory failure with hypoxia    End stage COPD on 2-3 L home O2 and on hospice as outpatient - off BiPAP but has severe rhonchi, wheezing and cough today - will add Mucomyst and chest PT today - cont Solumedrol, Azithromycin and Neb treatments - at baseline O2 level  Active Problems:      Paroxysmal atrial fibrillation with RVR - now in NSR- d/c Cardizem infusion- continue oral Cardizem - cont Eliquis- he state hospice will no pay for it though    OSA on CPAP - cont CPAP for bedtime use   Time spent in minutes: 35 DVT prophylaxis: DNR Code Status: Full code Family Communication:  Disposition Plan:  Status is: Inpatient  Remains inpatient appropriate because:treating COPD exacerbation with IV steroids   Dispo: The patient is from: Home              Anticipated d/c is to: Home              Anticipated d/c date is: 1 day              Patient currently is not medically stable to d/c.      Consultants:   None Procedures:   None  Antimicrobials:  Anti-infectives (From admission, onward)   Start     Dose/Rate Route Frequency Ordered Stop   05/29/20 1000  azithromycin (ZITHROMAX) tablet 250 mg     Discontinue     250 mg Oral Every  M-W-F 05/28/20 1806         Objective: Vitals:   05/30/20 0400 05/30/20 0543 05/30/20 0641 05/30/20 0744  BP: (!) 105/50     Pulse: (!) 59     Resp: 10     Temp: 98.4 F (36.9 C)   98.2 F (36.8 C)  TempSrc:      SpO2: 96%  95%   Weight:  61 kg    Height:        Intake/Output Summary (Last 24 hours) at 05/30/2020 1013 Last data filed at 05/30/2020 8786 Gross per 24 hour  Intake 778.67 ml  Output 1475 ml  Net -696.33 ml   Filed Weights   05/28/20 1850 05/29/20 0401 05/30/20 0543  Weight: 61.8 kg 61.2 kg 61 kg    Examination: General exam: Appears comfortable  HEENT: PERRLA, oral mucosa moist, no sclera icterus or thrush Respiratory system: Rhonchi and wheezing bilaterally Cardiovascular system: S1 &  S2 heard,  No murmurs  Gastrointestinal system: Abdomen soft, non-tender, nondistended. Normal bowel sounds   Central nervous system: Alert and oriented. No focal neurological deficits. Extremities: No cyanosis, clubbing or edema Skin: No rashes or ulcers Psychiatry:  Mood & affect appropriate.   Data Reviewed: I have personally reviewed following labs and imaging studies  CBC: Recent Labs  Lab 05/28/20 1420 05/30/20 0513  WBC 8.9 14.0*  NEUTROABS 7.7  --   HGB 13.3 11.6*  HCT 40.9 35.4*  MCV 101.5* 102.6*  PLT 187 767   Basic Metabolic Panel: Recent Labs  Lab 05/28/20 1420 05/30/20 0513  NA 140 140  K 4.1 4.7  CL 100 99  CO2 27 31  GLUCOSE 152* 144*  BUN 11 22  CREATININE 0.88 0.90  CALCIUM 9.2 9.3   GFR: Estimated Creatinine Clearance: 56.5 mL/min (by C-G formula based on SCr of 0.9 mg/dL). Liver Function Tests: No results for input(s): AST, ALT, ALKPHOS, BILITOT, PROT, ALBUMIN in the last 168 hours. No results for input(s): LIPASE, AMYLASE in the last 168 hours. No results for input(s): AMMONIA in the last 168 hours. Coagulation Profile: No results for input(s): INR, PROTIME in the last 168 hours. Cardiac Enzymes: No results for input(s): CKTOTAL, CKMB, CKMBINDEX, TROPONINI in the last 168 hours. BNP (last 3 results) No results for input(s): PROBNP in the last 8760 hours. HbA1C: No results for input(s): HGBA1C in the last 72 hours. CBG: No results for input(s): GLUCAP in the last 168 hours. Lipid Profile: No results for input(s): CHOL, HDL, LDLCALC, TRIG, CHOLHDL, LDLDIRECT in the last 72 hours. Thyroid Function Tests: No results for input(s): TSH, T4TOTAL, FREET4, T3FREE, THYROIDAB in the last 72 hours. Anemia Panel: No results for input(s): VITAMINB12, FOLATE, FERRITIN, TIBC, IRON, RETICCTPCT in the last 72 hours. Urine analysis:    Component Value Date/Time   COLORURINE STRAW (A) 04/26/2018 0330   APPEARANCEUR CLEAR 04/26/2018 0330   LABSPEC 1.008  04/26/2018 0330   PHURINE 6.0 04/26/2018 0330   GLUCOSEU NEGATIVE 04/26/2018 0330   HGBUR NEGATIVE 04/26/2018 0330   BILIRUBINUR NEGATIVE 04/26/2018 0330   KETONESUR 5 (A) 04/26/2018 0330   PROTEINUR NEGATIVE 04/26/2018 0330   NITRITE NEGATIVE 04/26/2018 0330   LEUKOCYTESUR NEGATIVE 04/26/2018 0330   Sepsis Labs: @LABRCNTIP (procalcitonin:4,lacticidven:4) ) Recent Results (from the past 240 hour(s))  SARS Coronavirus 2 by RT PCR (hospital order, performed in [place] hospital lab) Nasopharyngeal Nasopharyngeal Swab     Status: None   Collection Time: 05/28/20  5:05 PM   Specimen: Nasopharyngeal Swab  Result Value Ref Range Status   SARS Coronavirus 2 NEGATIVE NEGATIVE Final    Comment: (NOTE) SARS-CoV-2 target nucleic acids are NOT DETECTED.  The SARS-CoV-2 RNA is generally detectable in upper and lower respiratory specimens during the acute phase of infection. The lowest concentration of SARS-CoV-2 viral copies this assay can detect is 250 copies / mL. A negative result does not preclude SARS-CoV-2 infection and should not be used as the sole basis for treatment or other patient management decisions.  A negative result may occur with improper specimen collection / handling, submission of specimen other than nasopharyngeal swab, presence of viral mutation(s) within the areas targeted by this assay, and inadequate number of viral copies (<250 copies / mL). A negative result must be combined with clinical observations, patient history, and epidemiological information.  Fact Sheet for Patients:   StrictlyIdeas.no  Fact Sheet for Healthcare Providers: BankingDealers.co.za  This test is not yet approved or  cleared by the [place] FDA and has been authorized for detection and/or diagnosis of SARS-CoV-2 by FDA under an Emergency Use Authorization (EUA).  This EUA will remain in effect (meaning this test can be used) for the  duration of the COVID-19 declaration under Section 564(b)(1) of the Act, 21 U.S.C. section 360bbb-3(b)(1), unless the authorization is terminated or revoked sooner.  Performed at [facility], [place] [address] [place], [place] [address]          Radiology Studies: DG Chest Port 1 View  Result Date: 05/28/2020 CLINICAL DATA:  shob EXAM: PORTABLE CHEST - 1 VIEW COMPARISON:  11/18/2019 FINDINGS: Attenuated bronchovascular markings in the upper lobes. Postop changes in the right mid lung and hilum. Some bronchovascular crowding in the lung bases. No focal infiltrate or overt edema. Heart size and mediastinal contours are within normal limits. No effusion.  No definite pneumothorax. Visualized bones unremarkable. IMPRESSION: COPD.  No acute cardiopulmonary disease. Electronically Signed   By: Lucrezia Europe M.D.   On: 05/28/2020 14:08      Scheduled Meds: . acetylcysteine  600 mg Oral BID  . apixaban  5 mg Oral BID  . azithromycin  250 mg Oral Q M,W,F  . diltiazem  180 mg Oral QHS  . finasteride  5 mg Oral QHS  . guaiFENesin  600 mg Oral BID  . ipratropium-albuterol  3 mL Nebulization TID  . LORazepam  0.5 mg Oral Q4H  . methylPREDNISolone (SOLU-MEDROL) injection  40 mg Intravenous Q12H  . metoCLOPramide  10 mg Oral TID AC  . morphine  15 mg Oral Q4H  . pantoprazole  40 mg Oral BID  . polyethylene glycol  17 g Oral QHS  . rosuvastatin  10 mg Oral QHS  . senna  2 tablet Oral BID  . sildenafil  20 mg Oral BID  . tamsulosin  0.4 mg Oral QPC breakfast  . temazepam  30 mg Oral QHS   Continuous Infusions:   LOS: 2 days      Debbe Odea, MD [facility] Pager: [email or web address] 05/30/2020, 10:13 AM

## 2020-05-30 NOTE — Plan of Care (Signed)
  Problem: Activity: Goal: Risk for activity intolerance will decrease Outcome: Progressing   Problem: Coping: Goal: Level of anxiety will decrease Outcome: Progressing   Problem: Safety: Goal: Ability to remain free from injury will improve Outcome: Progressing   

## 2020-05-30 NOTE — TOC Benefit Eligibility Note (Signed)
Transition of Care [facility]) Benefit Eligibility Note    Patient Details  Name: Andrew [name] MRN: [id number] Date of Birth: 02-16-1941   Medication/Dose: Eliquis  Covered?: Yes  Tier: 3 Drug  Prescription Coverage Preferred Pharmacy: [place], [place] or [employer] mail order  Spoke with Person/Company/Phone Number:: [employer]  Co-Pay: $45 for 30 day retail/ $224.06 for 90 day mail order  Prior Approval: No          [employer] Phone Number: 05/30/2020, 10:14 AM

## 2020-05-30 NOTE — Progress Notes (Signed)
Pt placed on BIPAP for night time rest.  

## 2020-05-30 NOTE — Progress Notes (Signed)
   05/30/20 1400  Assess: MEWS Score  Temp 98 F (36.7 C)  BP (!) 161/67  Pulse Rate (!) 107  ECG Heart Rate 99  Resp (!) 22  SpO2 95 %  O2 Device HFNC  O2 Flow Rate (L/min) 6 L/min  Assess: MEWS Score  MEWS Temp 0  MEWS Systolic 0  MEWS Pulse 0  MEWS RR 1  MEWS LOC 0  MEWS Score 1  MEWS Score Color Green   Patient transitioning from bipap back to highflow. patieht is eating lunch. PRN medications given and scheduled medications also given. Will continue to monitor patients work of breathing and HR

## 2020-05-30 NOTE — Plan of Care (Signed)
  Problem: Activity: Goal: Risk for activity intolerance will decrease Outcome: Progressing   Problem: Pain Managment: Goal: General experience of comfort will improve Outcome: Progressing   Problem: Clinical Measurements: Goal: Respiratory complications will improve Outcome: Not Progressing  PT. Requires Bipap. Increased work of breathing noted when off.  Problem: Safety: Goal: Ability to remain free from injury will improve Outcome: Not Progressing Pt. Removing Bipap attempting to go to restroom. O2 saturation down to 76%.

## 2020-05-31 LAB — BASIC METABOLIC PANEL
Anion gap: 11 (ref 5–15)
BUN: 23 mg/dL (ref 8–23)
CO2: 31 mmol/L (ref 22–32)
Calcium: 8.9 mg/dL (ref 8.9–10.3)
Chloride: 98 mmol/L (ref 98–111)
Creatinine, Ser: 0.88 mg/dL (ref 0.61–1.24)
GFR calc Af Amer: >60 mL/min (ref >60)
GFR calc non Af Amer: >60 mL/min (ref >60)
Glucose, Bld: 162 mg/dL — ABNORMAL HIGH (ref 70–99)
Potassium: 4.5 mmol/L (ref 3.5–5.1)
Sodium: 140 mmol/L (ref 135–145)

## 2020-05-31 MED ORDER — IPRATROPIUM-ALBUTEROL 0.5-2.5 (3) MG/3ML IN SOLN: 3.0000 mL | RESPIRATORY_TRACT | Status: DC | PRN

## 2020-05-31 MED ORDER — IPRATROPIUM-ALBUTEROL 0.5-2.5 (3) MG/3ML IN SOLN
3.0000 mL | RESPIRATORY_TRACT | Status: DC
  Administered 2020-05-31 – 2020-06-01 (×5): 3 mL via RESPIRATORY_TRACT
  Filled 2020-05-31 (×6): qty 3

## 2020-05-31 NOTE — Progress Notes (Signed)
[facility])  @ 1100  Patient is under [facility] hospice services with a terminal diagnosis of COPD per Dr. Eulas Post with [facility]. Patient began experiencing increased SOB on 05/28/20 and elected to call EMS to transport for evaluation in the ED. Pt was unable to wait for hospice RN to come to his home. He is admitted with COPD exacerbation. This is a related hospital admission.  Pt asleep during visit and no visitors in the room.  Pt off of BiPAP, used it all night for rest.  He typically uses CPAP at home.  Currently on O2 via [place] @ 6 lpm.   V/S:  99.1 oral, 137/68, HR 91, RR 18, SPO2 95% on O2 via  at 6 lpm I&O:  680/1525 Labs:  BMET unremarkable; CBC not obtained--yesterday WBC 14. IVs/PRNs:  xopenex neb x 1, morphine 5 mg PO x 1, solu-medrol 40 mg IV BID, ativan 0.5 mg PO q4h  Problem List -COPD exacerbation- On 6 lpm, baseline is 3, BiPAP HS PRN. Scheduled inhalers as well as ativan and morphine.   -leukocytosis- on azithromycin 250 mg PO MWF, mucomyst 600 mg nebulized BID, mucinex 600 mg. PO BID. Afebrile, rhonchi noted.  D/C plan - d/c home once medically optimized GOC - clear, DNR, return with hospice Family - updated dtr IDT - updated  Venia Carbon RN, BSN, [facility] Liaison

## 2020-05-31 NOTE — Plan of Care (Signed)

## 2020-05-31 NOTE — Consult Note (Signed)
   [facility] Inpatient Consult   05/31/2020  Andrew [name] 05-15-1941 [id number]  [place] Patient:  Medicare NextGen  Chart reviewed and reveals the patient is currently activie with Hospice prior to admission.Medical record reviewed of Hospice notes.  Patient discussed in unit progression patient care meeting and transitional needs currently are with Hospice.  Plan:  Will sign off at transition.  For questions,  Natividad Brood, RN BSN [facility] Liaison  [phone] business mobile phone Toll free office [phone]  Fax number: [phone] [email or web address]

## 2020-05-31 NOTE — Progress Notes (Signed)
[facility] PROGRESS NOTE    Progress Note  Andrew [name]  ZOX:[id number] DOB: 26-Aug-1941 DOA: 05/28/2020 PCP: Lujean Amel, MD     Brief Narrative:   Andrew [name] is an 79 y.o. 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  Assessment/Plan:   Acute on chronic respiratory failure with hypoxia due to COPD exacerbation (HCC) Off BiPAP with severe rhonchi bilaterally with wheezing and cough today.  He has BiPAP at night as needed. Continue Mucomyst chest physiotherapy, IV Solu-Medrol, antibiotics and inhalers. Still with significant wheezing on physical exam, will schedule his DuoNebs every 4 and continue DuoNebs every 2 as needed. Out of bed to chair, consult physical therapy. Encourage flutter valve and incentive spirometry. Keep saturations greater than 88%, I do not want him at 100% duration is going to stop his respiratory drive.  Paroxysmal atrial fibrillation: Now in sinus rhythm on oral Cardizem continue Eliquis. Hospice will not pay for Eliquis.  Stage I pressure ulcer present on admission RN Pressure Injury Documentation: Pressure Injury 12/27/18 Stage I -  Intact skin with non-blanchable redness of a localized area usually over a bony prominence. Small red area on left buttock (Active)  12/27/18 0629  Location: Buttocks  Location Orientation: Left  Staging: Stage I -  Intact skin with non-blanchable redness of a localized area usually over a bony prominence.  Wound Description (Comments): Small red area on left buttock  Present on Admission: Yes    Estimated body mass index is 21.11 kg/m as calculated from the following:   Height as of this encounter: 5\' 7"  (1.702 m).   Weight as of this  encounter: 61.1 kg.   DVT prophylaxis: lovenox Family Communication:Daughter Status is: Inpatient  Remains inpatient appropriate because:Hemodynamically unstable   Dispo: The patient is from: Home              Anticipated d/c is to: Home              Anticipated d/c date is: 3 days              Patient currently is not medically stable to d/c.        Code Status:     Code Status Orders  (From admission, onward)         Start     Ordered   05/28/20 1726  Do not attempt resuscitation (DNR)  Continuous       Question Answer Comment  In the event of cardiac or respiratory ARREST Do not call a "code blue"   In the event of cardiac or respiratory ARREST Do not perform Intubation, CPR, defibrillation or ACLS   In the event of cardiac or respiratory ARREST Use medication by any route, position, wound care, and other measures to relive pain and suffering. May use oxygen, suction and manual treatment of airway obstruction as needed for comfort.      05/28/20 1725        Code Status History    Date Active Date Inactive Code Status Order ID Comments User Context   12/27/2018 0333 12/30/2018 1732 Full Code [id number]  Etta Quill, DO ED   12/12/2018 2235 12/15/2018 1552 Full Code [id number]  Phillips Grout, MD ED   04/26/2018 707 420 7465 04/30/2018  2041 Full Code [id number]  Harvie Bridge, DO ED   01/17/2018 2015 01/21/2018 1609 Full Code [id number]  Vianne Bulls, MD ED   09/09/2017 2124 09/12/2017 2204 Full Code [id number]  Ivor Costa, MD ED   12/07/2016 2141 12/11/2016 1633 Partial Code [id number]  Lily Kocher, MD Inpatient   06/28/2016 2205 07/01/2016 1641 Full Code [id number]  Vianne Bulls, MD Inpatient   02/11/2016 2135 02/15/2016 2005 Full Code [id number]  Reubin Milan, MD Inpatient   12/28/2015 0318 01/03/2016 1739 DNR [id number]  Etta Quill, DO ED   12/28/2015 0317 12/28/2015 0318 Full Code [id number]  Etta Quill, DO ED   Advance Care Planning Activity        IV  Access:    Peripheral IV   Procedures and diagnostic studies:   No results found.   Medical Consultants:    None.  Anti-Infectives:   azithro  Subjective:    Andrew [name] he relates his breathing is not improved.  Objective:    Vitals:   05/31/20 0401 05/31/20 0808 05/31/20 0818 05/31/20 1244  BP: 138/64 137/68    Pulse: 63 91    Resp: 18 18  16   Temp: 98.7 F (37.1 C) 99.1 F (37.3 C)  98 F (36.7 C)  TempSrc:  Oral  Oral  SpO2: 99% 95% 94%   Weight:      Height:       SpO2: 94 % O2 Flow Rate (L/min): 6 L/min   Intake/Output Summary (Last 24 hours) at 05/31/2020 1344 Last data filed at 05/31/2020 1300 Gross per 24 hour  Intake 920 ml  Output 1475 ml  Net -555 ml   Filed Weights   05/29/20 0401 05/30/20 0543 05/31/20 0002  Weight: 61.2 kg 61 kg 61.1 kg    Exam: General exam: In no acute distress. Respiratory system: Poor air movement with wheezing bilaterally. Cardiovascular system: S1 & S2 heard, RRR. No JVD. Gastrointestinal system: Abdomen is nondistended, soft and nontender.  Extremities: No pedal edema. Skin: No rashes, lesions or ulcers Psychiatry: Judgement and insight appear normal. Mood & affect appropriate.    Data Reviewed:    Labs: Basic Metabolic Panel: Recent Labs  Lab 05/28/20 1420 05/28/20 1420 05/30/20 0513 05/31/20 0522  NA 140  --  140 140  K 4.1   < > 4.7 4.5  CL 100  --  99 98  CO2 27  --  31 31  GLUCOSE 152*  --  144* 162*  BUN 11  --  22 23  CREATININE 0.88  --  0.90 0.88  CALCIUM 9.2  --  9.3 8.9   < > = values in this interval not displayed.   GFR Estimated Creatinine Clearance: 57.9 mL/min (by C-G formula based on SCr of 0.88 mg/dL). Liver Function Tests: No results for input(s): AST, ALT, ALKPHOS, BILITOT, PROT, ALBUMIN in the last 168 hours. No results for input(s): LIPASE, AMYLASE in the last 168 hours. No results for input(s): AMMONIA in the last 168 hours. Coagulation profile No results for  input(s): INR, PROTIME in the last 168 hours. COVID-19 Labs  No results for input(s): DDIMER, FERRITIN, LDH, CRP in the last 72 hours.  Lab Results  Component Value Date   SARSCOV2NAA NEGATIVE 05/28/2020   [place] Not Detected 12/29/2019   [place] Not Detected 07/02/2019    CBC: Recent Labs  Lab 05/28/20 1420 05/30/20 0513  WBC 8.9 14.0*  NEUTROABS 7.7  --   HGB 13.3 11.6*  HCT  40.9 35.4*  MCV 101.5* 102.6*  PLT 187 204   Cardiac Enzymes: No results for input(s): CKTOTAL, CKMB, CKMBINDEX, TROPONINI in the last 168 hours. BNP (last 3 results) No results for input(s): PROBNP in the last 8760 hours. CBG: No results for input(s): GLUCAP in the last 168 hours. D-Dimer: No results for input(s): DDIMER in the last 72 hours. Hgb A1c: No results for input(s): HGBA1C in the last 72 hours. Lipid Profile: No results for input(s): CHOL, HDL, LDLCALC, TRIG, CHOLHDL, LDLDIRECT in the last 72 hours. Thyroid function studies: No results for input(s): TSH, T4TOTAL, T3FREE, THYROIDAB in the last 72 hours.  Invalid input(s): FREET3 Anemia work up: No results for input(s): VITAMINB12, FOLATE, FERRITIN, TIBC, IRON, RETICCTPCT in the last 72 hours. Sepsis Labs: Recent Labs  Lab 05/28/20 1420 05/30/20 0513  WBC 8.9 14.0*   Microbiology Recent Results (from the past 240 hour(s))  SARS Coronavirus 2 by RT PCR (hospital order, performed in [facility] hospital lab) Nasopharyngeal Nasopharyngeal Swab     Status: None   Collection Time: 05/28/20  5:05 PM   Specimen: Nasopharyngeal Swab  Result Value Ref Range Status   SARS Coronavirus 2 NEGATIVE NEGATIVE Final    Comment: (NOTE) SARS-CoV-2 target nucleic acids are NOT DETECTED.  The SARS-CoV-2 RNA is generally detectable in upper and lower respiratory specimens during the acute phase of infection. The lowest concentration of SARS-CoV-2 viral copies this assay can detect is 250 copies / mL. A negative result does not preclude  SARS-CoV-2 infection and should not be used as the sole basis for treatment or other patient management decisions.  A negative result may occur with improper specimen collection / handling, submission of specimen other than nasopharyngeal swab, presence of viral mutation(s) within the areas targeted by this assay, and inadequate number of viral copies (<250 copies / mL). A negative result must be combined with clinical observations, patient history, and epidemiological information.  Fact Sheet for Patients:   StrictlyIdeas.no  Fact Sheet for Healthcare Providers: BankingDealers.co.za  This test is not yet approved or  cleared by the [place] FDA and has been authorized for detection and/or diagnosis of SARS-CoV-2 by FDA under an Emergency Use Authorization (EUA).  This EUA will remain in effect (meaning this test can be used) for the duration of the COVID-19 declaration under Section 564(b)(1) of the Act, 21 U.S.C. section 360bbb-3(b)(1), unless the authorization is terminated or revoked sooner.  Performed at [facility], [place] [address], [place], [place] [address]      Medications:   . acetylcysteine  600 mg Oral BID  . apixaban  5 mg Oral BID  . azithromycin  250 mg Oral Q M,W,F  . diltiazem  180 mg Oral QHS  . finasteride  5 mg Oral QHS  . guaiFENesin  600 mg Oral BID  . ipratropium-albuterol  3 mL Nebulization TID  . LORazepam  0.5 mg Oral Q4H  . methylPREDNISolone (SOLU-MEDROL) injection  40 mg Intravenous Q12H  . metoCLOPramide  10 mg Oral TID AC  . morphine  15 mg Oral Q4H  . pantoprazole  40 mg Oral BID  . polyethylene glycol  17 g Oral QHS  . rosuvastatin  10 mg Oral QHS  . senna  2 tablet Oral BID  . sildenafil  20 mg Oral BID  . tamsulosin  0.4 mg Oral QPC breakfast  . temazepam  30 mg Oral QHS   Continuous Infusions:    LOS: 3 days  Charlynne Cousins  [facility]  05/31/2020, 1:44  PM

## 2020-05-31 NOTE — Progress Notes (Signed)
Patient currently off Bipap and wearing 6l Oval.  Patient tolerating well.

## 2020-05-31 NOTE — Care Management Important Message (Signed)
Important Message  Patient Details  Name: Andrew [name] MRN: [id number] Date of Birth: February 06, 1941   Medicare Important Message Given:  Yes     Shelda Altes 05/31/2020, 10:41 AM

## 2020-06-01 MED ORDER — DEXTROSE 50 % IV SOLN
INTRAVENOUS | Status: AC
  Filled 2020-06-01: qty 50

## 2020-06-01 MED ORDER — IPRATROPIUM-ALBUTEROL 0.5-2.5 (3) MG/3ML IN SOLN
3.0000 mL | Freq: Two times a day (BID) | RESPIRATORY_TRACT | Status: DC
  Administered 2020-06-01 – 2020-06-03 (×4): 3 mL via RESPIRATORY_TRACT
  Filled 2020-06-01 (×4): qty 3

## 2020-06-01 NOTE — Evaluation (Signed)
Physical Therapy Evaluation Patient Details Name: Andrew [name] MRN: [id number] DOB: 1941-03-16 Today's Date: 06/01/2020   History of Present Illness  79 y.o. 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 Has had declining health over past 2 months and has been on hospice at home care. Admitted to hospital 05/28/20 for treatment of COPD exacerbation- acute on chronic respiratory failure with hypoxia End stage COPD on 2-3L home O2 and on hospice as outpatient as well as paroxysmal atrial fibrillation with RVR  Clinical Impression  PTA pt living with daughter and her family in single story home with level entry. Pt reports independence with household ambulation and use of Rollator for limited community distances. Pt has hospice aide who assists with bathing 2x/week and hospice RN who will be visiting 2x/wk after this hospitalization. Pt on 2L O2 via [place] on entry with SaO2 95% O2, pt able to ambulate with therapy and maintain >88%O2 with ambulation, as well as maintain HR 63-92 bpm. Pt is limited in safe mobility by increased fatigue and decreased strength and endurance. Pt is mod I for bed mobility, min guard for transfers and min guard for ambulation of 40 feet with RW. PT does not feel pt will require any additional PT services at d/c. Pt encouraged to use RW at home until he feels a little steadier on his feet. Pt in agreement. PT will continue to follow acutely.     Follow Up Recommendations No PT follow up;Supervision/Assistance - 24 hour    Equipment Recommendations  None recommended by PT       Precautions / Restrictions Precautions Precautions: Fall Restrictions Weight Bearing Restrictions: No      Mobility  Bed Mobility Overal bed mobility: Modified Independent             General bed mobility  comments: HOB elevated, use of bedrail to come to EoB  Transfers Overall transfer level: Needs assistance Equipment used: Rolling walker (2 wheeled) Transfers: Sit to/from Stand Sit to Stand: Min guard         General transfer comment: min guard for safety, vc for hand postion, good power up and self steadying from bed and recliner  Ambulation/Gait Ambulation/Gait assistance: Min guard;Min assist Gait Distance (Feet): 40 Feet (1x40, 1x20) Assistive device: Rolling walker (2 wheeled) Gait Pattern/deviations: Step-through pattern;Decreased stride length;Shuffle;Trunk flexed Gait velocity: slowed Gait velocity interpretation: <1.31 ft/sec, indicative of household ambulator General Gait Details: min A progressing to min guard as ambulation progressed, vc for upright posture, able to tolerate 2 bouts of ambulation 1x40 feet and 1x20 feet after seated rest break, PT reports household need 15 feet from bed to bath to kitchen       Balance Overall balance assessment: Needs assistance Sitting-balance support: Feet supported;No upper extremity supported Sitting balance-Leahy Scale: Fair     Standing balance support: Bilateral upper extremity supported;Single extremity supported;During functional activity Standing balance-Leahy Scale: Poor                               Pertinent Vitals/Pain Pain Assessment: No/denies pain    Home Living Family/patient expects to be discharged to:: Private residence Living Arrangements: Children Available Help at Discharge: Family;Available 24 hours/day Type of Home: House Home Access: Level entry     Home Layout: One level Home Equipment: Hand held  shower head;Shower seat;Grab bars - tub/shower;Walker - 2 wheels;Walker - 4 wheels      Prior Function Level of Independence: Independent with assistive device(s);Needs assistance   Gait / Transfers Assistance Needed: no AD for home ambulation, uses Rollator for public ambulation    ADL's / Homemaking Assistance Needed: has hospice aide to assit with bathing 2x week   Comments: hospice RN following 1x/week will increase to 2x/wk after hospitalization     Hand Dominance   Dominant Hand: Right    Extremity/Trunk Assessment   Upper Extremity Assessment Upper Extremity Assessment: Generalized weakness    Lower Extremity Assessment Lower Extremity Assessment: Generalized weakness    Cervical / Trunk Assessment Cervical / Trunk Assessment: Kyphotic  Communication   Communication: HOH  Cognition Arousal/Alertness: Awake/alert Behavior During Therapy: WFL for tasks assessed/performed Overall Cognitive Status: Within Functional Limits for tasks assessed                                        General Comments General comments (skin integrity, edema, etc.): Pt on 2L O2 via [place] with SaO2 98%O2 on entry, with ambulation dropped to 88%O2 but quickly rebounded to93%O2, HR at rest 63bpm, max with ambulation 92 bpm, recovered to 76bpm back in bed        Assessment/Plan    PT Assessment Patient needs continued PT services  PT Problem List Decreased strength;Decreased activity tolerance;Decreased balance;Decreased mobility;Cardiopulmonary status limiting activity       PT Treatment Interventions DME instruction;Gait training;Functional mobility training;Therapeutic activities;Therapeutic exercise;Balance training;Cognitive remediation;Patient/family education    PT Goals (Current goals can be found in the Care Plan section)  Acute Rehab PT Goals Patient Stated Goal: get home PT Goal Formulation: With patient Time For Goal Achievement: 06/15/20 Potential to Achieve Goals: Fair    Frequency Min 3X/week   Barriers to discharge        Co-evaluation               AM-PAC PT "6 Clicks" Mobility  Outcome Measure Help needed turning from your back to your side while in a flat bed without using bedrails?: None Help needed moving from  lying on your back to sitting on the side of a flat bed without using bedrails?: A Little Help needed moving to and from a bed to a chair (including a wheelchair)?: A Little Help needed standing up from a chair using your arms (e.g., wheelchair or bedside chair)?: A Little Help needed to walk in hospital room?: A Little Help needed climbing 3-5 steps with a railing? : A Lot 6 Click Score: 18    End of Session Equipment Utilized During Treatment: Gait belt;Oxygen Activity Tolerance: Patient limited by fatigue Patient left: in bed;with call bell/phone within reach;Other (comment) (physician in room ) Nurse Communication: Mobility status PT Visit Diagnosis: Unsteadiness on feet (R26.81);Other abnormalities of gait and mobility (R26.89);Muscle weakness (generalized) (M62.81);Difficulty in walking, not elsewhere classified (R26.2)    Time: 1005-1037 PT Time Calculation (min) (ACUTE ONLY): 32 min   Charges:   PT Evaluation $PT Eval Moderate Complexity: 1 Mod PT Treatments $Gait Training: 8-22 mins        Anslie Spadafora B. Migdalia Dk PT, DPT Acute Rehabilitation Services Pager [phone] Office [phone]   [place] 06/01/2020, 11:07 AM

## 2020-06-01 NOTE — Discharge Instructions (Signed)

## 2020-06-01 NOTE — Plan of Care (Signed)

## 2020-06-01 NOTE — Progress Notes (Signed)
[facility])    Patient is under [facility] hospice services with a terminal diagnosis of COPD per Dr. Eulas Post with [facility]. Patient began experiencing increased SOB on 05/28/20 and elected to call EMS to transport for evaluation in the ED. Pt was unable to wait for hospice RN to come to his home. He is admitted with COPD exacerbation. This is a related hospital admission.   Visited with pt at bedside.  Pt AO at baseline, reports breathing much improved today.  Pt denies unmet needs, reports being eager to d/c home tomorrow.  Report exchanged with bedside RN who reports pt  OOB for ADLs independently without increase in HR. Currently on O2 via [place] @ 2 lpm.    V/S:  98.8 oral, 141/68, HR 56,  RR 14, SPO2 94% on O2 via [place] at 2 lpm I&O:  720/1100 Labs:  no new labs IVs/PRNs:   solu-medrol 40 mg IV BID   Problem List -COPD exacerbation- On 2 lpm, baseline is 3, BiPAP HS PRN. Scheduled inhalers as well as ativan and morphine which are now PO.   -leukocytosis- on azithromycin 250 mg PO MWF, mucomyst 600 mg nebulized BID, mucinex 600 mg. PO BID. Afebrile, rhonchi resolved at this time   D/C plan - d/c home tomorrow [place] - clear, DNR, return with hospice Family - updated dtr by phone IDT - updated   Domenic Moras, BSN, [facility] Liaison [phone]

## 2020-06-01 NOTE — Progress Notes (Signed)
[facility] PROGRESS NOTE    Progress Note  Jeret Goyer  QIH:[id number] DOB: 01-20-1941 DOA: 05/28/2020 PCP: Lujean Amel, MD     Brief Narrative:   Panayiotis [place] is an 79 y.o. 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  Assessment/Plan:   Acute on chronic respiratory failure with hypoxia due to COPD exacerbation (HCC) Now off BiPAP still with wheezing but with more air movement today. Continue BiPAP as needed. Continue Mucomyst, chest physiotherapy IV Solu-Medrol steroids and inhalers. Continue inhalers every 4 and as needed. Out of bed to chair, continue to work with physical therapy.  He only desatted to 87% on 3 L with physical therapy, his heart rate did not go up and he relates he is returning back to baseline that he has had a significant improvement in his air movement. Encourage flutter valve and incentive spirometry. Try to keep saturations greater than 88%, I do not want him at 100%.  Paroxysmal atrial fibrillation: Now in sinus rhythm on oral Cardizem continue Eliquis. Hospice will not pay for Eliquis.  Stage I pressure ulcer present on admission RN Pressure Injury Documentation: Pressure Injury 12/27/18 Stage I -  Intact skin with non-blanchable redness of a localized area usually over a bony prominence. Small red area on left buttock (Active)  12/27/18 0629  Location: Buttocks  Location Orientation: Left  Staging: Stage I -  Intact skin with non-blanchable redness of a localized area usually over a bony prominence.  Wound Description (Comments): Small red area on left buttock  Present on Admission: Yes    Estimated body mass index is 21.16 kg/m as calculated from the  following:   Height as of this encounter: 5\' 7"  (1.702 m).   Weight as of this encounter: 61.3 kg.   DVT prophylaxis: lovenox Family Communication:Daughter Status is: Inpatient  Remains inpatient appropriate because:Hemodynamically unstable   Dispo: The patient is from: Home              Anticipated d/c is to: Home              Anticipated d/c date is: 1 days              Patient currently is not medically stable to d/c.  He probably needs 1 more day of IV steroids and inhalers.        Code Status:     Code Status Orders  (From admission, onward)         Start     Ordered   05/28/20 1726  Do not attempt resuscitation (DNR)  Continuous       Question Answer Comment  In the event of cardiac or respiratory ARREST Do not call a code blue   In the event of cardiac or respiratory ARREST Do not perform Intubation, CPR, defibrillation or ACLS   In the event of cardiac or respiratory ARREST Use medication by any route, position, wound care, and other measures to relive pain and suffering. May use oxygen, suction and manual treatment of airway obstruction as needed for comfort.      05/28/20 1725        Code Status History    Date Active Date Inactive Code Status Order ID Comments User Context   12/27/2018  7026 12/30/2018 1732 Full Code [id number]  Etta Quill, DO ED   12/12/2018 2235 12/15/2018 1552 Full Code [id number]  Phillips Grout, MD ED   04/26/2018 0511 04/30/2018 2041 Full Code [id number]  Harvie Bridge, DO ED   01/17/2018 2015 01/21/2018 1609 Full Code [id number]  Vianne Bulls, MD ED   09/09/2017 2124 09/12/2017 2204 Full Code [id number]  Ivor Costa, MD ED   12/07/2016 2141 12/11/2016 1633 Partial Code [id number]  Lily Kocher, MD Inpatient   06/28/2016 2205 07/01/2016 1641 Full Code [id number]  Vianne Bulls, MD Inpatient   02/11/2016 2135 02/15/2016 2005 Full Code [id number]  Reubin Milan, MD Inpatient   12/28/2015 0318 01/03/2016 1739 DNR [id number]  Etta Quill, DO ED   12/28/2015 0317 12/28/2015 0318 Full Code [id number]  Etta Quill, DO ED   Advance Care Planning Activity        IV Access:    Peripheral IV   Procedures and diagnostic studies:   No results found.   Medical Consultants:    None.  Anti-Infectives:   azithro  Subjective:    [name] his breathing is significantly improved today. Continues to have a productive cough. Objective:    Vitals:   06/01/20 0655 06/01/20 0804 06/01/20 0900 06/01/20 1000  BP:  (!) 150/69  133/62  Pulse:  80  60  Resp:  18  15  Temp:  98.6 F (37 C)  98.9 F (37.2 C)  TempSrc:  Oral  Oral  SpO2:  94% 98% 96%  Weight: 61.3 kg     Height:       SpO2: 96 % O2 Flow Rate (L/min): 2 L/min FiO2 (%): 40 %   Intake/Output Summary (Last 24 hours) at 06/01/2020 1038 Last data filed at 06/01/2020 0805 Gross per 24 hour  Intake 720 ml  Output 800 ml  Net -80 ml   Filed Weights   05/30/20 0543 05/31/20 0002 06/01/20 0655  Weight: 61 kg 61.1 kg 61.3 kg    Exam: General exam: In no acute distress. Respiratory system: Movement is improved now is moderate, continues to wheeze bilaterally. Cardiovascular system: S1 & S2 heard, RRR. No JVD. Gastrointestinal system: Abdomen is nondistended, soft and nontender.  Extremities: No pedal edema. Skin: No rashes, lesions or ulcers Psychiatry: Judgement and insight appear normal. Mood & affect appropriate.   Data Reviewed:    Labs: Basic Metabolic Panel: Recent Labs  Lab 05/28/20 1420 05/28/20 1420 05/30/20 0513 05/31/20 0522  NA 140  --  140 140  K 4.1   < > 4.7 4.5  CL 100  --  99 98  CO2 27  --  31 31  GLUCOSE 152*  --  144* 162*  BUN 11  --  22 23  CREATININE 0.88  --  0.90 0.88  CALCIUM 9.2  --  9.3 8.9   < > = values in this interval not displayed.   GFR Estimated Creatinine Clearance: 58 mL/min (by C-G formula based on SCr of 0.88 mg/dL). Liver Function Tests: No results for input(s): AST, ALT, ALKPHOS,  BILITOT, PROT, ALBUMIN in the last 168 hours. No results for input(s): LIPASE, AMYLASE in the last 168 hours. No results for input(s): AMMONIA in the last 168 hours. Coagulation profile No results for input(s): INR, PROTIME in the last 168 hours. COVID-19 Labs  No results for input(s): DDIMER, FERRITIN, LDH, CRP in the last 72 hours.  Lab Results  Component Value Date   SARSCOV2NAA  NEGATIVE 05/28/2020   [place] Not Detected 12/29/2019   [place] Not Detected 07/02/2019    CBC: Recent Labs  Lab 05/28/20 1420 05/30/20 0513  WBC 8.9 14.0*  NEUTROABS 7.7  --   HGB 13.3 11.6*  HCT 40.9 35.4*  MCV 101.5* 102.6*  PLT 187 204   Cardiac Enzymes: No results for input(s): CKTOTAL, CKMB, CKMBINDEX, TROPONINI in the last 168 hours. BNP (last 3 results) No results for input(s): PROBNP in the last 8760 hours. CBG: No results for input(s): GLUCAP in the last 168 hours. D-Dimer: No results for input(s): DDIMER in the last 72 hours. Hgb A1c: No results for input(s): HGBA1C in the last 72 hours. Lipid Profile: No results for input(s): CHOL, HDL, LDLCALC, TRIG, CHOLHDL, LDLDIRECT in the last 72 hours. Thyroid function studies: No results for input(s): TSH, T4TOTAL, T3FREE, THYROIDAB in the last 72 hours.  Invalid input(s): FREET3 Anemia work up: No results for input(s): VITAMINB12, FOLATE, FERRITIN, TIBC, IRON, RETICCTPCT in the last 72 hours. Sepsis Labs: Recent Labs  Lab 05/28/20 1420 05/30/20 0513  WBC 8.9 14.0*   Microbiology Recent Results (from the past 240 hour(s))  SARS Coronavirus 2 by RT PCR (hospital order, performed in [facility] hospital lab) Nasopharyngeal Nasopharyngeal Swab     Status: None   Collection Time: 05/28/20  5:05 PM   Specimen: Nasopharyngeal Swab  Result Value Ref Range Status   SARS Coronavirus 2 NEGATIVE NEGATIVE Final    Comment: (NOTE) SARS-CoV-2 target nucleic acids are NOT DETECTED.  The SARS-CoV-2 RNA is generally detectable in  upper and lower respiratory specimens during the acute phase of infection. The lowest concentration of SARS-CoV-2 viral copies this assay can detect is 250 copies / mL. A negative result does not preclude SARS-CoV-2 infection and should not be used as the sole basis for treatment or other patient management decisions.  A negative result may occur with improper specimen collection / handling, submission of specimen other than nasopharyngeal swab, presence of viral mutation(s) within the areas targeted by this assay, and inadequate number of viral copies (<250 copies / mL). A negative result must be combined with clinical observations, patient history, and epidemiological information.  Fact Sheet for Patients:   StrictlyIdeas.no  Fact Sheet for Healthcare Providers: BankingDealers.co.za  This test is not yet approved or  cleared by the [place] FDA and has been authorized for detection and/or diagnosis of SARS-CoV-2 by FDA under an Emergency Use Authorization (EUA).  This EUA will remain in effect (meaning this test can be used) for the duration of the COVID-19 declaration under Section 564(b)(1) of the Act, 21 U.S.C. section 360bbb-3(b)(1), unless the authorization is terminated or revoked sooner.  Performed at [facility], [place] [address], [place], [place] [address]      Medications:    apixaban  5 mg Oral BID   azithromycin  250 mg Oral Q M,W,F   diltiazem  180 mg Oral QHS   finasteride  5 mg Oral QHS   guaiFENesin  600 mg Oral BID   ipratropium-albuterol  3 mL Nebulization Q4H   LORazepam  0.5 mg Oral Q4H   methylPREDNISolone (SOLU-MEDROL) injection  40 mg Intravenous Q12H   metoCLOPramide  10 mg Oral TID AC   morphine  15 mg Oral Q4H   pantoprazole  40 mg Oral BID   polyethylene glycol  17 g Oral QHS   rosuvastatin  10 mg Oral QHS   senna  2 tablet Oral BID   sildenafil  20 mg Oral BID    tamsulosin  0.4 mg Oral QPC breakfast   temazepam  30 mg Oral QHS   Continuous Infusions:    LOS: 4 days   Charlynne Cousins  [facility]  06/01/2020, 10:38 AM

## 2020-06-02 MED ORDER — PREDNISONE 50 MG PO TABS: 50.0000 mg | ORAL_TABLET | Freq: Every day | ORAL | Status: DC

## 2020-06-02 NOTE — Progress Notes (Signed)
  [facility])    Patient is under [facility] hospice services with a terminal diagnosis of COPD per Dr. Eulas Post with [facility]. Patient began experiencing increased SOB on 05/28/20 and elected to call EMS to transport for evaluation in the ED. Pt was unable to wait for hospice RN to come to his home. He is admitted with COPD exacerbation. This is a related hospital admission.   Visited with pt at bedside.  Pt denies new or unmet needs, continues to be eager to discharge home.  Report exchanged with bedside RN Pamala Hurry.  Pt continues to use BiPap HS, using O2 via [place] @ 2 lpm.    V/S:  98.3 oral, 108/61, HR 71,  RR 17, SPO2 97% on O2 via [place] at 2 lpm I&O:  not charted Labs:  no new labs IVs/PRNs:   solu-medrol 40 mg IV BID   Problem List -COPD exacerbation- On 2 lpm, baseline is 3, BiPAP HS PRN. Scheduled inhalers as well as ativan and morphine which are now PO.    D/C plan - d/c home tomorrow [place] - clear, DNR, return with hospice Family - updated dtr by phone IDT - updated  Domenic Moras, BSN, RN [facility] Liaison [phone] (24h on call)

## 2020-06-02 NOTE — Plan of Care (Signed)
  Problem: Clinical Measurements: Goal: Will remain free from infection Outcome: Completed/Met   Problem: Safety: Goal: Ability to remain free from injury will improve Outcome: Completed/Met   Problem: Respiratory: Goal: Ability to maintain a clear airway will improve Outcome: Completed/Met Goal: Levels of oxygenation will improve Outcome: Completed/Met Goal: Ability to maintain adequate ventilation will improve Outcome: Completed/Met

## 2020-06-02 NOTE — Plan of Care (Signed)
  Problem: Education: Goal: Knowledge of General Education information will improve Description Including pain rating scale, medication(s)/side effects and non-pharmacologic comfort measures Outcome: Progressing   

## 2020-06-02 NOTE — TOC Progression Note (Signed)
Transition of Care [facility]) - Progression Note    Patient Details  Name: Andrew [name] MRN: [id number] Date of Birth: May 29, 1941  Transition of Care [facility]) CM/SW Contact  Zenon Mayo, RN Phone Number: 06/02/2020, 3:33 PM  Clinical Narrative:    Hospice does not cover the eliquis, patient will have to pay for this out of pocket per Venia Carbon with [facility].        Expected Discharge Plan and Services                                                 Social Determinants of Health (SDOH) Interventions    Readmission Risk Interventions No flowsheet data found.

## 2020-06-02 NOTE — Progress Notes (Signed)
[facility] PROGRESS NOTE    Progress Note  Andrew [name]  BSJ:[id number] DOB: 1941-08-15 DOA: 05/28/2020 PCP: Lujean Amel, MD     Brief Narrative:   Andrew [name] is an 79 y.o. 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  Assessment/Plan:   Acute on chronic respiratory failure with hypoxia due to COPD exacerbation (HCC) Continue BiPAP as needed.  Wheezing has resolved. Discontinue Mucomyst, change steroids to orals continue inhalers as scheduled. Encourage flutter valve and incentive spirometry. Try to keep him greater than 88%, I do not want him at 100%.  Paroxysmal atrial fibrillation: Now in sinus rhythm on oral Cardizem continue Eliquis. Hospice will not pay for Eliquis.  Stage I pressure ulcer present on admission RN Pressure Injury Documentation: Pressure Injury 12/27/18 Stage I -  Intact skin with non-blanchable redness of a localized area usually over a bony prominence. Small red area on left buttock (Active)  12/27/18 0629  Location: Buttocks  Location Orientation: Left  Staging: Stage I -  Intact skin with non-blanchable redness of a localized area usually over a bony prominence.  Wound Description (Comments): Small red area on left buttock  Present on Admission: Yes    Estimated body mass index is 21.44 kg/m as calculated from the following:   Height as of this encounter: 5\' 7"  (1.702 m).   Weight as of this encounter: 62.1 kg.   DVT prophylaxis: lovenox Family Communication:Daughter Status is: Inpatient  Remains inpatient appropriate because:Hemodynamically unstable   Dispo: The patient is from: Home              Anticipated d/c is to: Home              Anticipated d/c  date is: 1 days              Patient currently is not medically stable to d/c.          Code Status:     Code Status Orders  (From admission, onward)         Start     Ordered   05/28/20 1726  Do not attempt resuscitation (DNR)  Continuous       Question Answer Comment  In the event of cardiac or respiratory ARREST Do not call a "code blue"   In the event of cardiac or respiratory ARREST Do not perform Intubation, CPR, defibrillation or ACLS   In the event of cardiac or respiratory ARREST Use medication by any route, position, wound care, and other measures to relive pain and suffering. May use oxygen, suction and manual treatment of airway obstruction as needed for comfort.      05/28/20 1725        Code Status History    Date Active Date Inactive Code Status Order ID Comments User Context   12/27/2018 0333 12/30/2018 1732 Full Code [id number]  Etta Quill, DO ED   12/12/2018 2235 12/15/2018 1552 Full Code [id number]  Phillips Grout, MD ED   04/26/2018 0511 04/30/2018 2041 Full Code [id number]  Harvie Bridge, DO ED   01/17/2018 2015 01/21/2018 1609 Full Code [id number]  Vianne Bulls, MD ED   09/09/2017 2124 09/12/2017 2204 Full Code [id number]  Ivor Costa, MD ED   12/07/2016 2141  12/11/2016 1633 Partial Code [id number]  Lily Kocher, MD Inpatient   06/28/2016 2205 07/01/2016 1641 Full Code [id number]  Vianne Bulls, MD Inpatient   02/11/2016 2135 02/15/2016 2005 Full Code [id number]  Reubin Milan, MD Inpatient   12/28/2015 0318 01/03/2016 1739 DNR [id number]  Etta Quill, DO ED   12/28/2015 0317 12/28/2015 0318 Full Code [id number]  Etta Quill, DO ED   Advance Care Planning Activity        IV Access:    Peripheral IV   Procedures and diagnostic studies:   No results found.   Medical Consultants:    None.  Anti-Infectives:   azithro  Subjective:    Andrew [name] no complaints feels great. Objective:    Vitals:   06/02/20 0127 06/02/20 0243  06/02/20 0523 06/02/20 0739  BP:   120/62 (!) 144/79  Pulse:  61 (!) 56 80  Resp:  14 13 (!) 21  Temp:   98.7 F (37.1 C) 97.7 F (36.5 C)  TempSrc:   Oral Tympanic  SpO2:  96% 100% 95%  Weight: 62.1 kg     Height:       SpO2: 95 % O2 Flow Rate (L/min): 2 L/min FiO2 (%): 40 %   Intake/Output Summary (Last 24 hours) at 06/02/2020 1021 Last data filed at 06/02/2020 0700 Gross per 24 hour  Intake 680 ml  Output 350 ml  Net 330 ml   Filed Weights   05/31/20 0002 06/01/20 0655 06/02/20 0127  Weight: 61.1 kg 61.3 kg 62.1 kg    Exam: General exam: In no acute distress. Respiratory system: Good air movement and wheezing has resolved. Cardiovascular system: S1 & S2 heard, RRR. No JVD. Gastrointestinal system: Abdomen is nondistended, soft and nontender.  Extremities: No pedal edema. Skin: No rashes, lesions or ulcers Psychiatry: Judgement and insight appear normal. Mood & affect appropriate.   Data Reviewed:    Labs: Basic Metabolic Panel: Recent Labs  Lab 05/28/20 1420 05/28/20 1420 05/30/20 0513 05/31/20 0522  NA 140  --  140 140  K 4.1   < > 4.7 4.5  CL 100  --  99 98  CO2 27  --  31 31  GLUCOSE 152*  --  144* 162*  BUN 11  --  22 23  CREATININE 0.88  --  0.90 0.88  CALCIUM 9.2  --  9.3 8.9   < > = values in this interval not displayed.   GFR Estimated Creatinine Clearance: 58.8 mL/min (by C-G formula based on SCr of 0.88 mg/dL). Liver Function Tests: No results for input(s): AST, ALT, ALKPHOS, BILITOT, PROT, ALBUMIN in the last 168 hours. No results for input(s): LIPASE, AMYLASE in the last 168 hours. No results for input(s): AMMONIA in the last 168 hours. Coagulation profile No results for input(s): INR, PROTIME in the last 168 hours. COVID-19 Labs  No results for input(s): DDIMER, FERRITIN, LDH, CRP in the last 72 hours.  Lab Results  Component Value Date   SARSCOV2NAA NEGATIVE 05/28/2020   [place] Not Detected 12/29/2019   [place] Not  Detected 07/02/2019    CBC: Recent Labs  Lab 05/28/20 1420 05/30/20 0513  WBC 8.9 14.0*  NEUTROABS 7.7  --   HGB 13.3 11.6*  HCT 40.9 35.4*  MCV 101.5* 102.6*  PLT 187 204   Cardiac Enzymes: No results for input(s): CKTOTAL, CKMB, CKMBINDEX, TROPONINI in the last 168 hours. BNP (last 3 results) No results for input(s): PROBNP in the last 8760 hours.  CBG: No results for input(s): GLUCAP in the last 168 hours. D-Dimer: No results for input(s): DDIMER in the last 72 hours. Hgb A1c: No results for input(s): HGBA1C in the last 72 hours. Lipid Profile: No results for input(s): CHOL, HDL, LDLCALC, TRIG, CHOLHDL, LDLDIRECT in the last 72 hours. Thyroid function studies: No results for input(s): TSH, T4TOTAL, T3FREE, THYROIDAB in the last 72 hours.  Invalid input(s): FREET3 Anemia work up: No results for input(s): VITAMINB12, FOLATE, FERRITIN, TIBC, IRON, RETICCTPCT in the last 72 hours. Sepsis Labs: Recent Labs  Lab 05/28/20 1420 05/30/20 0513  WBC 8.9 14.0*   Microbiology Recent Results (from the past 240 hour(s))  SARS Coronavirus 2 by RT PCR (hospital order, performed in [facility] hospital lab) Nasopharyngeal Nasopharyngeal Swab     Status: None   Collection Time: 05/28/20  5:05 PM   Specimen: Nasopharyngeal Swab  Result Value Ref Range Status   SARS Coronavirus 2 NEGATIVE NEGATIVE Final    Comment: (NOTE) SARS-CoV-2 target nucleic acids are NOT DETECTED.  The SARS-CoV-2 RNA is generally detectable in upper and lower respiratory specimens during the acute phase of infection. The lowest concentration of SARS-CoV-2 viral copies this assay can detect is 250 copies / mL. A negative result does not preclude SARS-CoV-2 infection and should not be used as the sole basis for treatment or other patient management decisions.  A negative result may occur with improper specimen collection / handling, submission of specimen other than nasopharyngeal swab, presence of viral  mutation(s) within the areas targeted by this assay, and inadequate number of viral copies (<250 copies / mL). A negative result must be combined with clinical observations, patient history, and epidemiological information.  Fact Sheet for Patients:   StrictlyIdeas.no  Fact Sheet for Healthcare Providers: BankingDealers.co.za  This test is not yet approved or  cleared by the [place] FDA and has been authorized for detection and/or diagnosis of SARS-CoV-2 by FDA under an Emergency Use Authorization (EUA).  This EUA will remain in effect (meaning this test can be used) for the duration of the COVID-19 declaration under Section 564(b)(1) of the Act, 21 U.S.C. section 360bbb-3(b)(1), unless the authorization is terminated or revoked sooner.  Performed at [facility], [place] [address] [place], [place] [address]      Medications:   . apixaban  5 mg Oral BID  . azithromycin  250 mg Oral Q M,W,F  . diltiazem  180 mg Oral QHS  . finasteride  5 mg Oral QHS  . guaiFENesin  600 mg Oral BID  . ipratropium-albuterol  3 mL Nebulization BID  . LORazepam  0.5 mg Oral Q4H  . methylPREDNISolone (SOLU-MEDROL) injection  40 mg Intravenous Q12H  . metoCLOPramide  10 mg Oral TID AC  . morphine  15 mg Oral Q4H  . pantoprazole  40 mg Oral BID  . polyethylene glycol  17 g Oral QHS  . rosuvastatin  10 mg Oral QHS  . senna  2 tablet Oral BID  . sildenafil  20 mg Oral BID  . tamsulosin  0.4 mg Oral QPC breakfast  . temazepam  30 mg Oral QHS   Continuous Infusions:    LOS: 5 days   Charlynne Cousins  [facility]  06/02/2020, 10:21 AM

## 2020-06-03 MED ORDER — PREDNISONE 10 MG PO TABS
ORAL_TABLET | ORAL | 0 refills | Status: DC
Start: 2020-06-03 — End: 2020-06-20

## 2020-06-03 MED ORDER — PREDNISONE 50 MG PO TABS
50.0000 mg | ORAL_TABLET | Freq: Every day | ORAL | Status: DC
  Administered 2020-06-03: 50 mg via ORAL
  Filled 2020-06-03: qty 1

## 2020-06-03 MED ORDER — PREDNISONE 10 MG PO TABS: 10.0000 mg | ORAL_TABLET | Freq: Every day | ORAL | 4 refills | Status: DC

## 2020-06-03 NOTE — Discharge Summary (Signed)
Physician Discharge Summary  Corleone Biegler AVW:[id number] DOB: 01-12-1941 DOA: 05/28/2020  PCP: Lujean Amel, MD  Admit date: 05/28/2020 Discharge date: 06/03/2020  Admitted From: Home Disposition:  Home  Recommendations for Outpatient Follow-up:  1. Follow up with PCP in 1-2 weeks 2. Please obtain BMP/CBC in one week 3. [facility] to follow-up as an outpatient.  Home Health:No Equipment/Devices:none  Discharge Condition:Stable CODE STATUS:Full Diet recommendation: Heart Healthy   Brief/Interim Summary: 79 y.o. 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  Discharge Diagnoses:  Principal Problem:   COPD exacerbation ([place]) Active Problems:   End stage COPD ([place])   Acute on chronic respiratory failure with hypoxia (HCC)   Paroxysmal atrial fibrillation (HCC)   OSA on CPAP   Chronic anticoagulation   Dyspnea  Acute on chronic respiratory failure with hypoxia due to COPD exacerbation: He was admitted to the hospital placed on BiPAP with poor air movement started on IV steroids, Mucomyst antibiotics and scheduled inhalers. After 4 days of this treatment he started slowly to improve. He was continued on flutter valve and incentive spirometry. He was transitioned to oral steroids which she will continue 2-week steroid taper at home. Hospice to follow-up at home and address goals of care as this will be a recurrent theme for him due to his end-stage COPD.  Paroxysmal atrial fibrillation: In sinus rhythm no changes made to his medication.  Discharge Instructions  Discharge Instructions    Diet - low sodium heart healthy   Complete by: As directed    Increase activity slowly   Complete by: As  directed    No wound care   Complete by: As directed      Allergies as of 06/03/2020   No Known Allergies     Medication List    TAKE these medications   AeroChamber MV inhaler Use as instructed   albuterol 108 (90 Base) MCG/ACT inhaler Commonly known as: VENTOLIN HFA Inhale 2 puffs into the lungs every 6 (six) hours as needed for wheezing or shortness of breath.   aspirin EC 81 MG tablet Take 81 mg by mouth daily.   azithromycin 250 MG tablet Commonly known as: ZITHROMAX Take 250 mg by mouth every Monday, Wednesday, and Friday.   diltiazem 180 MG 24 hr capsule Commonly known as: CARDIZEM CD TAKE 1 CAPSULE EVERY DAY (NEEDS OFFICE VISIT FOR FURTHER REFILLS) What changed: See the new instructions.   Eliquis 5 MG Tabs tablet Generic drug: apixaban Take 5 mg by mouth 2 (two) times daily.   finasteride 5 MG tablet Commonly known as: PROSCAR Take 5 mg by mouth at bedtime.   furosemide 40 MG tablet Commonly known as: LASIX Take 40 mg by mouth every Monday, Wednesday, and Friday.   guaiFENesin 600 MG 12 hr tablet Commonly known as: MUCINEX Take 1 tablet (600 mg total) by mouth 2 (two) times daily.   ipratropium-albuterol 0.5-2.5 (3) MG/3ML Soln Commonly known as: DUONEB Take 3 mLs by nebulization every 6 (six) hours as needed. What changed: when to take this   levalbuterol 0.63 MG/3ML nebulizer solution Commonly known as: Xopenex Take 3 mLs (0.63 mg total) by nebulization every 4 (four) hours as needed for wheezing or shortness of breath.   LORazepam 0.5 MG  tablet Commonly known as: ATIVAN Take 0.5 mg by mouth See admin instructions. Take one tablet (0.5 mg) by mouth every 3 1/2 hours - scheduled   metoCLOPramide 10 MG tablet Commonly known as: REGLAN Take 10 mg by mouth 3 (three) times daily before meals.   morphine 15 MG tablet Commonly known as: MSIR Take 15 mg by mouth See admin instructions. Take one tablet (15 mg) by mouth every 3 1/2 hours -  scheduled   omeprazole 40 MG capsule Commonly known as: PRILOSEC Take 40 mg by mouth 2 (two) times daily.   OXYGEN Inhale 3 L into the lungs continuous.   polyethylene glycol 17 g packet Commonly known as: MIRALAX / GLYCOLAX Take 17 g by mouth daily. What changed:   when to take this  additional instructions   Potassium Chloride ER 20 MEQ Tbcr Take 20 mEq by mouth daily.   predniSONE 10 MG tablet Commonly known as: DELTASONE Takes 6 tablets for 2 days, then 5 tablets for 2 days, then 4 tablets for 2 days, then 3 tablets for 2 days, then 2 tabs for 2 days, then 1 tab for 1 day. What changed: You were already taking a medication with the same name, and this prescription was added. Make sure you understand how and when to take each.   predniSONE 10 MG tablet Commonly known as: DELTASONE Take 1 tablet (10 mg total) by mouth daily with breakfast. Start taking on: June 17, 2020 What changed: These instructions start on June 17, 2020. If you are unsure what to do until then, ask your doctor or other care provider.   prochlorperazine 10 MG tablet Commonly known as: COMPAZINE Take 10 mg by mouth every 4 (four) hours as needed for nausea or vomiting.   rosuvastatin 10 MG tablet Commonly known as: CRESTOR Take 10 mg by mouth at bedtime.   senna 8.6 MG Tabs tablet Commonly known as: SENOKOT Take 2 tablets by mouth 2 (two) times daily.   sildenafil 20 MG tablet Commonly known as: REVATIO Take 1 tablet (20 mg total) by mouth 2 (two) times daily.   tamsulosin 0.4 MG Caps capsule Commonly known as: FLOMAX Take 1 capsule (0.4 mg total) by mouth daily after breakfast.   temazepam 30 MG capsule Commonly known as: RESTORIL Take 1 capsule (30 mg total) by mouth at bedtime.   vitamin C 1000 MG tablet Take 1,000 mg by mouth at bedtime.   Vitamin D 50 MCG (2000 UT) tablet Take 2,000 Units by mouth at bedtime.       No Known  Allergies  Consultations:  None   Procedures/Studies: DG Chest Port 1 View  Result Date: 05/28/2020 CLINICAL DATA:  shob EXAM: PORTABLE CHEST - 1 VIEW COMPARISON:  11/18/2019 FINDINGS: Attenuated bronchovascular markings in the upper lobes. Postop changes in the right mid lung and hilum. Some bronchovascular crowding in the lung bases. No focal infiltrate or overt edema. Heart size and mediastinal contours are within normal limits. No effusion.  No definite pneumothorax. Visualized bones unremarkable. IMPRESSION: COPD.  No acute cardiopulmonary disease. Electronically Signed   By: Lucrezia Europe M.D.   On: 05/28/2020 14:08     Subjective: No complain  Discharge Exam: Vitals:   06/03/20 0730 06/03/20 0833  BP: 129/76   Pulse: 68   Resp: 18   Temp:    SpO2: 90% 95%   Vitals:   06/03/20 0308 06/03/20 0309 06/03/20 0730 06/03/20 0833  BP:   129/76   Pulse: 63  68   Resp: 16  18   Temp: (!) 97.5 F (36.4 C)     TempSrc: Oral     SpO2: 99%  90% 95%  Weight:  61.6 kg    Height:        General: Pt is alert, awake, not in acute distress Cardiovascular: RRR, S1/S2 +, no rubs, no gallops Respiratory: CTA bilaterally, no wheezing, no rhonchi Abdominal: Soft, NT, ND, bowel sounds + Extremities: no edema, no cyanosis    The results of significant diagnostics from this hospitalization (including imaging, microbiology, ancillary and laboratory) are listed below for reference.     Microbiology: Recent Results (from the past 240 hour(s))  SARS Coronavirus 2 by RT PCR (hospital order, performed in [facility] hospital lab) Nasopharyngeal Nasopharyngeal Swab     Status: None   Collection Time: 05/28/20  5:05 PM   Specimen: Nasopharyngeal Swab  Result Value Ref Range Status   SARS Coronavirus 2 NEGATIVE NEGATIVE Final    Comment: (NOTE) SARS-CoV-2 target nucleic acids are NOT DETECTED.  The SARS-CoV-2 RNA is generally detectable in upper and lower respiratory specimens during the  acute phase of infection. The lowest concentration of SARS-CoV-2 viral copies this assay can detect is 250 copies / mL. A negative result does not preclude SARS-CoV-2 infection and should not be used as the sole basis for treatment or other patient management decisions.  A negative result may occur with improper specimen collection / handling, submission of specimen other than nasopharyngeal swab, presence of viral mutation(s) within the areas targeted by this assay, and inadequate number of viral copies (<250 copies / mL). A negative result must be combined with clinical observations, patient history, and epidemiological information.  Fact Sheet for Patients:   StrictlyIdeas.no  Fact Sheet for Healthcare Providers: BankingDealers.co.za  This test is not yet approved or  cleared by the [place] FDA and has been authorized for detection and/or diagnosis of SARS-CoV-2 by FDA under an Emergency Use Authorization (EUA).  This EUA will remain in effect (meaning this test can be used) for the duration of the COVID-19 declaration under Section 564(b)(1) of the Act, 21 U.S.C. section 360bbb-3(b)(1), unless the authorization is terminated or revoked sooner.  Performed at [facility], [place] [address] [place], [place] [address]      Labs: BNP (last 3 results) Recent Labs    11/18/19 1209  BNP 18   Basic Metabolic Panel: Recent Labs  Lab 05/28/20 1420 05/30/20 0513 05/31/20 0522  NA 140 140 140  K 4.1 4.7 4.5  CL 100 99 98  CO2 27 31 31   GLUCOSE 152* 144* 162*  BUN 11 22 23   CREATININE 0.88 0.90 0.88  CALCIUM 9.2 9.3 8.9   Liver Function Tests: No results for input(s): AST, ALT, ALKPHOS, BILITOT, PROT, ALBUMIN in the last 168 hours. No results for input(s): LIPASE, AMYLASE in the last 168 hours. No results for input(s): AMMONIA in the last 168 hours. CBC: Recent Labs  Lab 05/28/20 1420 05/30/20 0513  WBC 8.9  14.0*  NEUTROABS 7.7  --   HGB 13.3 11.6*  HCT 40.9 35.4*  MCV 101.5* 102.6*  PLT 187 204   Cardiac Enzymes: No results for input(s): CKTOTAL, CKMB, CKMBINDEX, TROPONINI in the last 168 hours. BNP: Invalid input(s): POCBNP CBG: No results for input(s): GLUCAP in the last 168 hours. D-Dimer No results for input(s): DDIMER in the last 72 hours. Hgb A1c No results for input(s): HGBA1C in the last 72 hours.  Lipid Profile No results for input(s): CHOL, HDL, LDLCALC, TRIG, CHOLHDL, LDLDIRECT in the last 72 hours. Thyroid function studies No results for input(s): TSH, T4TOTAL, T3FREE, THYROIDAB in the last 72 hours.  Invalid input(s): FREET3 Anemia work up No results for input(s): VITAMINB12, FOLATE, FERRITIN, TIBC, IRON, RETICCTPCT in the last 72 hours. Urinalysis    Component Value Date/Time   COLORURINE STRAW (A) 04/26/2018 0330   APPEARANCEUR CLEAR 04/26/2018 0330   LABSPEC 1.008 04/26/2018 0330   PHURINE 6.0 04/26/2018 0330   GLUCOSEU NEGATIVE 04/26/2018 0330   HGBUR NEGATIVE 04/26/2018 0330   BILIRUBINUR NEGATIVE 04/26/2018 0330   KETONESUR 5 (A) 04/26/2018 0330   PROTEINUR NEGATIVE 04/26/2018 0330   NITRITE NEGATIVE 04/26/2018 0330   LEUKOCYTESUR NEGATIVE 04/26/2018 0330   Sepsis Labs Invalid input(s): PROCALCITONIN,  WBC,  LACTICIDVEN Microbiology Recent Results (from the past 240 hour(s))  SARS Coronavirus 2 by RT PCR (hospital order, performed in [facility] hospital lab) Nasopharyngeal Nasopharyngeal Swab     Status: None   Collection Time: 05/28/20  5:05 PM   Specimen: Nasopharyngeal Swab  Result Value Ref Range Status   SARS Coronavirus 2 NEGATIVE NEGATIVE Final    Comment: (NOTE) SARS-CoV-2 target nucleic acids are NOT DETECTED.  The SARS-CoV-2 RNA is generally detectable in upper and lower respiratory specimens during the acute phase of infection. The lowest concentration of SARS-CoV-2 viral copies this assay can detect is 250 copies / mL. A negative  result does not preclude SARS-CoV-2 infection and should not be used as the sole basis for treatment or other patient management decisions.  A negative result may occur with improper specimen collection / handling, submission of specimen other than nasopharyngeal swab, presence of viral mutation(s) within the areas targeted by this assay, and inadequate number of viral copies (<250 copies / mL). A negative result must be combined with clinical observations, patient history, and epidemiological information.  Fact Sheet for Patients:   StrictlyIdeas.no  Fact Sheet for Healthcare Providers: BankingDealers.co.za  This test is not yet approved or  cleared by the [place] FDA and has been authorized for detection and/or diagnosis of SARS-CoV-2 by FDA under an Emergency Use Authorization (EUA).  This EUA will remain in effect (meaning this test can be used) for the duration of the COVID-19 declaration under Section 564(b)(1) of the Act, 21 U.S.C. section 360bbb-3(b)(1), unless the authorization is terminated or revoked sooner.  Performed at [facility], [place] [address], [place], [place] [address]      Time coordinating discharge: Over 30 minutes  SIGNED:   Charlynne Cousins, MD  [facility] 06/03/2020, 10:50 AM Pager   If 7PM-7AM, please contact night-coverage [email or web address] Password [email or web address]

## 2020-06-03 NOTE — TOC Transition Note (Signed)
Transition of Care [facility]) - CM/SW Discharge Note   Patient Details  Name: Andrew [name] MRN: [id number] Date of Birth: Sep 19, 1941  [facility] of [facility]) CM/SW Contact:  Carles Collet, RN Phone Number: 06/03/2020, 11:23 AM   Clinical Narrative:    Andrew [name] w patient at bedside.  He states that he will transport home via his girlfriend in private car. She is bringing his portable O2. Notified Venia Carbon at [facility] of his DC.  Please send him home w his DNR and Most forms on chart.      Final next level of care: Home w Hospice Care Barriers to Discharge: No Barriers Identified   Patient Goals and CMS Choice Patient states their goals for this hospitalization and ongoing recovery are:: return home w hospice      Discharge Placement                       Discharge Plan and Services                                     Social Determinants of Health (SDOH) Interventions     Readmission Risk Interventions No flowsheet data found.

## 2020-06-04 DIAGNOSIS — J9611 Chronic respiratory failure with hypoxia: Secondary | ICD-10-CM | POA: Diagnosis not present

## 2020-06-04 DIAGNOSIS — I272 Pulmonary hypertension, unspecified: Secondary | ICD-10-CM | POA: Diagnosis not present

## 2020-06-04 DIAGNOSIS — I10 Essential (primary) hypertension: Secondary | ICD-10-CM | POA: Diagnosis not present

## 2020-06-04 DIAGNOSIS — J432 Centrilobular emphysema: Secondary | ICD-10-CM | POA: Diagnosis not present

## 2020-06-04 DIAGNOSIS — I4891 Unspecified atrial fibrillation: Secondary | ICD-10-CM | POA: Diagnosis not present

## 2020-06-04 DIAGNOSIS — G4733 Obstructive sleep apnea (adult) (pediatric): Secondary | ICD-10-CM | POA: Diagnosis not present

## 2020-06-05 ENCOUNTER — Telehealth: Payer: Self-pay | Admitting: Cardiovascular Disease

## 2020-06-05 DIAGNOSIS — I272 Pulmonary hypertension, unspecified: Secondary | ICD-10-CM | POA: Diagnosis not present

## 2020-06-05 DIAGNOSIS — I4891 Unspecified atrial fibrillation: Secondary | ICD-10-CM | POA: Diagnosis not present

## 2020-06-05 DIAGNOSIS — I10 Essential (primary) hypertension: Secondary | ICD-10-CM | POA: Diagnosis not present

## 2020-06-05 DIAGNOSIS — J432 Centrilobular emphysema: Secondary | ICD-10-CM | POA: Diagnosis not present

## 2020-06-05 DIAGNOSIS — J9611 Chronic respiratory failure with hypoxia: Secondary | ICD-10-CM | POA: Diagnosis not present

## 2020-06-05 DIAGNOSIS — G4733 Obstructive sleep apnea (adult) (pediatric): Secondary | ICD-10-CM | POA: Diagnosis not present

## 2020-06-05 NOTE — Telephone Encounter (Signed)
Hassan Rowan from [facility] called to follow up on a supplemental order faxed to the office in June. I informed her I did not see one was received. She stated she would have it faxed over again.

## 2020-06-07 DIAGNOSIS — I4891 Unspecified atrial fibrillation: Secondary | ICD-10-CM | POA: Diagnosis not present

## 2020-06-07 DIAGNOSIS — J432 Centrilobular emphysema: Secondary | ICD-10-CM | POA: Diagnosis not present

## 2020-06-07 DIAGNOSIS — I272 Pulmonary hypertension, unspecified: Secondary | ICD-10-CM | POA: Diagnosis not present

## 2020-06-07 DIAGNOSIS — J9611 Chronic respiratory failure with hypoxia: Secondary | ICD-10-CM | POA: Diagnosis not present

## 2020-06-07 DIAGNOSIS — I10 Essential (primary) hypertension: Secondary | ICD-10-CM | POA: Diagnosis not present

## 2020-06-07 DIAGNOSIS — G4733 Obstructive sleep apnea (adult) (pediatric): Secondary | ICD-10-CM | POA: Diagnosis not present

## 2020-06-08 DIAGNOSIS — I10 Essential (primary) hypertension: Secondary | ICD-10-CM | POA: Diagnosis not present

## 2020-06-08 DIAGNOSIS — I272 Pulmonary hypertension, unspecified: Secondary | ICD-10-CM | POA: Diagnosis not present

## 2020-06-08 DIAGNOSIS — J432 Centrilobular emphysema: Secondary | ICD-10-CM | POA: Diagnosis not present

## 2020-06-08 DIAGNOSIS — I4891 Unspecified atrial fibrillation: Secondary | ICD-10-CM | POA: Diagnosis not present

## 2020-06-08 DIAGNOSIS — J9611 Chronic respiratory failure with hypoxia: Secondary | ICD-10-CM | POA: Diagnosis not present

## 2020-06-08 DIAGNOSIS — G4733 Obstructive sleep apnea (adult) (pediatric): Secondary | ICD-10-CM | POA: Diagnosis not present

## 2020-06-09 DIAGNOSIS — I272 Pulmonary hypertension, unspecified: Secondary | ICD-10-CM | POA: Diagnosis not present

## 2020-06-09 DIAGNOSIS — I10 Essential (primary) hypertension: Secondary | ICD-10-CM | POA: Diagnosis not present

## 2020-06-09 DIAGNOSIS — G4733 Obstructive sleep apnea (adult) (pediatric): Secondary | ICD-10-CM | POA: Diagnosis not present

## 2020-06-09 DIAGNOSIS — J9611 Chronic respiratory failure with hypoxia: Secondary | ICD-10-CM | POA: Diagnosis not present

## 2020-06-09 DIAGNOSIS — J432 Centrilobular emphysema: Secondary | ICD-10-CM | POA: Diagnosis not present

## 2020-06-09 DIAGNOSIS — I4891 Unspecified atrial fibrillation: Secondary | ICD-10-CM | POA: Diagnosis not present

## 2020-06-12 ENCOUNTER — Telehealth: Payer: Self-pay | Admitting: Pulmonary Disease

## 2020-06-12 DIAGNOSIS — I272 Pulmonary hypertension, unspecified: Secondary | ICD-10-CM | POA: Diagnosis not present

## 2020-06-12 DIAGNOSIS — I10 Essential (primary) hypertension: Secondary | ICD-10-CM | POA: Diagnosis not present

## 2020-06-12 DIAGNOSIS — J432 Centrilobular emphysema: Secondary | ICD-10-CM | POA: Diagnosis not present

## 2020-06-12 DIAGNOSIS — J9611 Chronic respiratory failure with hypoxia: Secondary | ICD-10-CM | POA: Diagnosis not present

## 2020-06-12 DIAGNOSIS — I4891 Unspecified atrial fibrillation: Secondary | ICD-10-CM | POA: Diagnosis not present

## 2020-06-12 DIAGNOSIS — G4733 Obstructive sleep apnea (adult) (pediatric): Secondary | ICD-10-CM | POA: Diagnosis not present

## 2020-06-12 NOTE — Telephone Encounter (Signed)
Yes I would continue 250mg  MWF Thanks Garner Nash, DO [facility] 06/12/2020 5:08 PM

## 2020-06-12 NOTE — Telephone Encounter (Signed)
Called spoke with Almyra Free she states when patient was discharged from hospital the azithromycin was not on his discharge medication sheet. Patient's family continued it anyway and now are out of it, Almyra Free is wondering if patient still needs to take medication 3 times a week.   Dr. Valeta Harms please advise

## 2020-06-12 NOTE — Telephone Encounter (Signed)
Spoke with Andrew [name] let her know Dr. Fabio Bering recommendations Nothing further needed at this time,.

## 2020-06-15 DIAGNOSIS — I4891 Unspecified atrial fibrillation: Secondary | ICD-10-CM | POA: Diagnosis not present

## 2020-06-15 DIAGNOSIS — I10 Essential (primary) hypertension: Secondary | ICD-10-CM | POA: Diagnosis not present

## 2020-06-15 DIAGNOSIS — I272 Pulmonary hypertension, unspecified: Secondary | ICD-10-CM | POA: Diagnosis not present

## 2020-06-15 DIAGNOSIS — J432 Centrilobular emphysema: Secondary | ICD-10-CM | POA: Diagnosis not present

## 2020-06-15 DIAGNOSIS — G4733 Obstructive sleep apnea (adult) (pediatric): Secondary | ICD-10-CM | POA: Diagnosis not present

## 2020-06-15 DIAGNOSIS — J9611 Chronic respiratory failure with hypoxia: Secondary | ICD-10-CM | POA: Diagnosis not present

## 2020-06-16 ENCOUNTER — Telehealth: Payer: Self-pay | Admitting: Pulmonary Disease

## 2020-06-16 DIAGNOSIS — I272 Pulmonary hypertension, unspecified: Secondary | ICD-10-CM | POA: Diagnosis not present

## 2020-06-16 DIAGNOSIS — J432 Centrilobular emphysema: Secondary | ICD-10-CM | POA: Diagnosis not present

## 2020-06-16 DIAGNOSIS — I10 Essential (primary) hypertension: Secondary | ICD-10-CM | POA: Diagnosis not present

## 2020-06-16 DIAGNOSIS — J9611 Chronic respiratory failure with hypoxia: Secondary | ICD-10-CM | POA: Diagnosis not present

## 2020-06-16 DIAGNOSIS — I4891 Unspecified atrial fibrillation: Secondary | ICD-10-CM | POA: Diagnosis not present

## 2020-06-16 DIAGNOSIS — G4733 Obstructive sleep apnea (adult) (pediatric): Secondary | ICD-10-CM | POA: Diagnosis not present

## 2020-06-16 NOTE — Telephone Encounter (Signed)
Yes that is fine.  [facility] 06/16/2020 6:42 PM

## 2020-06-16 NOTE — Telephone Encounter (Signed)
Spoke with Almyra Free with Hospice  She states pt already taking Duoneb tid and then we just added Xopenex and now pt taking this tid  He uses the treatments back to back she is is asking if this is okay  Please advise, thanks

## 2020-06-17 ENCOUNTER — Observation Stay (HOSPITAL_COMMUNITY)

## 2020-06-17 ENCOUNTER — Emergency Department (HOSPITAL_COMMUNITY)

## 2020-06-17 ENCOUNTER — Inpatient Hospital Stay (HOSPITAL_COMMUNITY)
Admission: EM | Admit: 2020-06-17 | Discharge: 2020-06-20 | DRG: 194 | Disposition: A | Discharge status: Home / Self Care | Attending: Internal Medicine | Admitting: Internal Medicine

## 2020-06-17 ENCOUNTER — Other Ambulatory Visit: Payer: Self-pay

## 2020-06-17 ENCOUNTER — Encounter (HOSPITAL_COMMUNITY): Payer: Self-pay

## 2020-06-17 DIAGNOSIS — Z823 Family history of stroke: Secondary | ICD-10-CM

## 2020-06-17 DIAGNOSIS — R0902 Hypoxemia: Secondary | ICD-10-CM | POA: Diagnosis not present

## 2020-06-17 DIAGNOSIS — L89301 Pressure ulcer of unspecified buttock, stage 1: Secondary | ICD-10-CM | POA: Diagnosis not present

## 2020-06-17 DIAGNOSIS — Z20822 Contact with and (suspected) exposure to covid-19: Secondary | ICD-10-CM | POA: Diagnosis present

## 2020-06-17 DIAGNOSIS — Z8673 Personal history of transient ischemic attack (TIA), and cerebral infarction without residual deficits: Secondary | ICD-10-CM

## 2020-06-17 DIAGNOSIS — I272 Pulmonary hypertension, unspecified: Secondary | ICD-10-CM | POA: Diagnosis not present

## 2020-06-17 DIAGNOSIS — J9611 Chronic respiratory failure with hypoxia: Secondary | ICD-10-CM | POA: Diagnosis not present

## 2020-06-17 DIAGNOSIS — K219 Gastro-esophageal reflux disease without esophagitis: Secondary | ICD-10-CM | POA: Diagnosis not present

## 2020-06-17 DIAGNOSIS — J441 Chronic obstructive pulmonary disease with (acute) exacerbation: Principal | ICD-10-CM

## 2020-06-17 DIAGNOSIS — I898 Other specified noninfective disorders of lymphatic vessels and lymph nodes: Secondary | ICD-10-CM | POA: Diagnosis not present

## 2020-06-17 DIAGNOSIS — R000 Tachycardia, unspecified: Secondary | ICD-10-CM | POA: Diagnosis not present

## 2020-06-17 DIAGNOSIS — Z515 Encounter for palliative care: Secondary | ICD-10-CM | POA: Diagnosis present

## 2020-06-17 DIAGNOSIS — I700 Atherosclerosis of aorta: Secondary | ICD-10-CM | POA: Diagnosis not present

## 2020-06-17 DIAGNOSIS — L89151 Pressure ulcer of sacral region, stage 1: Secondary | ICD-10-CM | POA: Diagnosis present

## 2020-06-17 DIAGNOSIS — Z961 Presence of intraocular lens: Secondary | ICD-10-CM | POA: Diagnosis present

## 2020-06-17 DIAGNOSIS — J189 Pneumonia, unspecified organism: Secondary | ICD-10-CM | POA: Diagnosis not present

## 2020-06-17 DIAGNOSIS — Z7982 Long term (current) use of aspirin: Secondary | ICD-10-CM

## 2020-06-17 DIAGNOSIS — B3742 Candidal balanitis: Secondary | ICD-10-CM | POA: Diagnosis present

## 2020-06-17 DIAGNOSIS — Z8249 Family history of ischemic heart disease and other diseases of the circulatory system: Secondary | ICD-10-CM

## 2020-06-17 DIAGNOSIS — N400 Enlarged prostate without lower urinary tract symptoms: Secondary | ICD-10-CM | POA: Diagnosis present

## 2020-06-17 DIAGNOSIS — Z9981 Dependence on supplemental oxygen: Secondary | ICD-10-CM

## 2020-06-17 DIAGNOSIS — I480 Paroxysmal atrial fibrillation: Secondary | ICD-10-CM | POA: Diagnosis not present

## 2020-06-17 DIAGNOSIS — E785 Hyperlipidemia, unspecified: Secondary | ICD-10-CM | POA: Diagnosis present

## 2020-06-17 DIAGNOSIS — Z7901 Long term (current) use of anticoagulants: Secondary | ICD-10-CM

## 2020-06-17 DIAGNOSIS — R140 Abdominal distension (gaseous): Secondary | ICD-10-CM | POA: Diagnosis not present

## 2020-06-17 DIAGNOSIS — J9621 Acute and chronic respiratory failure with hypoxia: Secondary | ICD-10-CM | POA: Diagnosis not present

## 2020-06-17 DIAGNOSIS — Z9989 Dependence on other enabling machines and devices: Secondary | ICD-10-CM

## 2020-06-17 DIAGNOSIS — G4733 Obstructive sleep apnea (adult) (pediatric): Secondary | ICD-10-CM | POA: Diagnosis not present

## 2020-06-17 DIAGNOSIS — Z9841 Cataract extraction status, right eye: Secondary | ICD-10-CM

## 2020-06-17 DIAGNOSIS — Z9842 Cataract extraction status, left eye: Secondary | ICD-10-CM

## 2020-06-17 DIAGNOSIS — R0689 Other abnormalities of breathing: Secondary | ICD-10-CM | POA: Diagnosis not present

## 2020-06-17 DIAGNOSIS — J80 Acute respiratory distress syndrome: Secondary | ICD-10-CM | POA: Diagnosis not present

## 2020-06-17 DIAGNOSIS — L89309 Pressure ulcer of unspecified buttock, unspecified stage: Secondary | ICD-10-CM | POA: Diagnosis present

## 2020-06-17 DIAGNOSIS — Z7952 Long term (current) use of systemic steroids: Secondary | ICD-10-CM

## 2020-06-17 DIAGNOSIS — R0602 Shortness of breath: Secondary | ICD-10-CM | POA: Diagnosis not present

## 2020-06-17 DIAGNOSIS — I4891 Unspecified atrial fibrillation: Secondary | ICD-10-CM | POA: Diagnosis not present

## 2020-06-17 DIAGNOSIS — Z79899 Other long term (current) drug therapy: Secondary | ICD-10-CM

## 2020-06-17 DIAGNOSIS — J439 Emphysema, unspecified: Secondary | ICD-10-CM | POA: Diagnosis not present

## 2020-06-17 DIAGNOSIS — Z7189 Other specified counseling: Secondary | ICD-10-CM

## 2020-06-17 DIAGNOSIS — I10 Essential (primary) hypertension: Secondary | ICD-10-CM | POA: Diagnosis present

## 2020-06-17 DIAGNOSIS — Z66 Do not resuscitate: Secondary | ICD-10-CM | POA: Diagnosis present

## 2020-06-17 DIAGNOSIS — Z87891 Personal history of nicotine dependence: Secondary | ICD-10-CM

## 2020-06-17 DIAGNOSIS — A419 Sepsis, unspecified organism: Secondary | ICD-10-CM | POA: Diagnosis not present

## 2020-06-17 DIAGNOSIS — J90 Pleural effusion, not elsewhere classified: Secondary | ICD-10-CM | POA: Diagnosis not present

## 2020-06-17 DIAGNOSIS — J432 Centrilobular emphysema: Secondary | ICD-10-CM | POA: Diagnosis not present

## 2020-06-17 LAB — TROPONIN I (HIGH SENSITIVITY): Troponin I (High Sensitivity): 11 ng/L (ref <18)

## 2020-06-17 LAB — CBC WITH DIFFERENTIAL/PLATELET
Abs Immature Granulocytes: 0.07 10*3/uL (ref 0.00–0.07)
Basophils Absolute: 0 10*3/uL (ref 0.0–0.1)
Basophils Relative: 0 %
Eosinophils Absolute: 0.1 10*3/uL (ref 0.0–0.5)
Eosinophils Relative: 0 %
HCT: 38.3 % — ABNORMAL LOW (ref 39.0–52.0)
Hemoglobin: 12.3 g/dL — ABNORMAL LOW (ref 13.0–17.0)
Immature Granulocytes: 0 %
Lymphocytes Relative: 2 %
Lymphs Abs: 0.3 10*3/uL — ABNORMAL LOW (ref 0.7–4.0)
MCH: 32.7 pg (ref 26.0–34.0)
MCHC: 32.1 g/dL (ref 30.0–36.0)
MCV: 101.9 fL — ABNORMAL HIGH (ref 80.0–100.0)
Monocytes Absolute: 0.6 10*3/uL (ref 0.1–1.0)
Monocytes Relative: 4 %
Neutro Abs: 14.7 10*3/uL — ABNORMAL HIGH (ref 1.7–7.7)
Neutrophils Relative %: 94 %
Platelets: 145 10*3/uL — ABNORMAL LOW (ref 150–400)
RBC: 3.76 MIL/uL — ABNORMAL LOW (ref 4.22–5.81)
RDW: 12.4 % (ref 11.5–15.5)
WBC: 15.8 10*3/uL — ABNORMAL HIGH (ref 4.0–10.5)
nRBC: 0 % (ref 0.0–0.2)

## 2020-06-17 LAB — URINALYSIS, ROUTINE W REFLEX MICROSCOPIC
Bilirubin Urine: NEGATIVE
Glucose, UA: 50 mg/dL — AB
Hgb urine dipstick: NEGATIVE
Ketones, ur: NEGATIVE mg/dL
Leukocytes,Ua: NEGATIVE
Nitrite: NEGATIVE
Protein, ur: NEGATIVE mg/dL
Specific Gravity, Urine: 1.015 (ref 1.005–1.030)
pH: 7 (ref 5.0–8.0)

## 2020-06-17 LAB — I-STAT VENOUS BLOOD GAS, ED
Acid-Base Excess: 7 mmol/L — ABNORMAL HIGH (ref 0.0–2.0)
Bicarbonate: 30.5 mmol/L — ABNORMAL HIGH (ref 20.0–28.0)
Calcium, Ion: 1.08 mmol/L — ABNORMAL LOW (ref 1.15–1.40)
HCT: 37 % — ABNORMAL LOW (ref 39.0–52.0)
Hemoglobin: 12.6 g/dL — ABNORMAL LOW (ref 13.0–17.0)
O2 Saturation: 96 %
Potassium: 3.8 mmol/L (ref 3.5–5.1)
Sodium: 139 mmol/L (ref 135–145)
TCO2: 32 mmol/L (ref 22–32)
pCO2, Ven: 38.7 mmHg — ABNORMAL LOW (ref 44.0–60.0)
pH, Ven: 7.505 — ABNORMAL HIGH (ref 7.250–7.430)
pO2, Ven: 76 mmHg — ABNORMAL HIGH (ref 32.0–45.0)

## 2020-06-17 LAB — COMPREHENSIVE METABOLIC PANEL
ALT: 22 U/L (ref 0–44)
AST: 14 U/L — ABNORMAL LOW (ref 15–41)
Albumin: 3.4 g/dL — ABNORMAL LOW (ref 3.5–5.0)
Alkaline Phosphatase: 51 U/L (ref 38–126)
Anion gap: 9 (ref 5–15)
BUN: 17 mg/dL (ref 8–23)
CO2: 30 mmol/L (ref 22–32)
Calcium: 8.7 mg/dL — ABNORMAL LOW (ref 8.9–10.3)
Chloride: 101 mmol/L (ref 98–111)
Creatinine, Ser: 0.92 mg/dL (ref 0.61–1.24)
GFR calc Af Amer: >60 mL/min (ref >60)
GFR calc non Af Amer: >60 mL/min (ref >60)
Glucose, Bld: 147 mg/dL — ABNORMAL HIGH (ref 70–99)
Potassium: 3.9 mmol/L (ref 3.5–5.1)
Sodium: 140 mmol/L (ref 135–145)
Total Bilirubin: 0.6 mg/dL (ref 0.3–1.2)
Total Protein: 5.6 g/dL — ABNORMAL LOW (ref 6.5–8.1)

## 2020-06-17 LAB — LACTIC ACID, PLASMA
Lactic Acid, Venous: 0.8 mmol/L (ref 0.5–1.9)
Lactic Acid, Venous: 0.7 mmol/L (ref 0.5–1.9)

## 2020-06-17 LAB — APTT: aPTT: 26 seconds (ref 24–36)

## 2020-06-17 LAB — SARS CORONAVIRUS 2 BY RT PCR (HOSPITAL ORDER, PERFORMED IN ~~LOC~~ HOSPITAL LAB): SARS Coronavirus 2: NEGATIVE

## 2020-06-17 LAB — BRAIN NATRIURETIC PEPTIDE: B Natriuretic Peptide: 39.3 pg/mL (ref 0.0–100.0)

## 2020-06-17 LAB — PROTIME-INR
INR: 1.2 (ref 0.8–1.2)
Prothrombin Time: 14.6 seconds (ref 11.4–15.2)

## 2020-06-17 MED ORDER — IPRATROPIUM-ALBUTEROL 0.5-2.5 (3) MG/3ML IN SOLN: 3.0000 mL | RESPIRATORY_TRACT | Status: DC | PRN

## 2020-06-17 MED ORDER — ASPIRIN EC 81 MG PO TBEC
81.0000 mg | DELAYED_RELEASE_TABLET | Freq: Every day | ORAL | Status: DC
  Administered 2020-06-18 – 2020-06-20 (×3): 81 mg via ORAL
  Filled 2020-06-17 (×3): qty 1

## 2020-06-17 MED ORDER — DILTIAZEM HCL ER COATED BEADS 180 MG PO CP24
180.0000 mg | ORAL_CAPSULE | Freq: Every day | ORAL | Status: DC
  Administered 2020-06-18 – 2020-06-19 (×3): 180 mg via ORAL
  Filled 2020-06-17 (×3): qty 1

## 2020-06-17 MED ORDER — ROSUVASTATIN CALCIUM 5 MG PO TABS
10.0000 mg | ORAL_TABLET | Freq: Every day | ORAL | Status: DC
  Administered 2020-06-18 – 2020-06-19 (×3): 10 mg via ORAL
  Filled 2020-06-17 (×3): qty 2

## 2020-06-17 MED ORDER — CLOTRIMAZOLE 1 % EX CREA
TOPICAL_CREAM | Freq: Two times a day (BID) | CUTANEOUS | Status: DC
  Administered 2020-06-18: 1 via TOPICAL
  Filled 2020-06-17 (×2): qty 15

## 2020-06-17 MED ORDER — MAGNESIUM SULFATE 2 GM/50ML IV SOLN
2.0000 g | Freq: Once | INTRAVENOUS | Status: AC
  Administered 2020-06-17: 2 g via INTRAVENOUS
  Filled 2020-06-17: qty 50

## 2020-06-17 MED ORDER — POTASSIUM CHLORIDE IN NACL 20-0.9 MEQ/L-% IV SOLN
INTRAVENOUS | Status: DC
  Filled 2020-06-17 (×2): qty 1000

## 2020-06-17 MED ORDER — ALBUTEROL SULFATE HFA 108 (90 BASE) MCG/ACT IN AERS
8.0000 | INHALATION_SPRAY | Freq: Once | RESPIRATORY_TRACT | Status: AC
  Administered 2020-06-17: 8 via RESPIRATORY_TRACT
  Filled 2020-06-17 (×2): qty 6.7

## 2020-06-17 MED ORDER — METOCLOPRAMIDE HCL 5 MG PO TABS
10.0000 mg | ORAL_TABLET | Freq: Three times a day (TID) | ORAL | Status: DC
  Administered 2020-06-18 – 2020-06-20 (×8): 10 mg via ORAL
  Filled 2020-06-17 (×9): qty 2

## 2020-06-17 MED ORDER — ACETAMINOPHEN 325 MG PO TABS: 650.0000 mg | ORAL_TABLET | ORAL | Status: DC | PRN

## 2020-06-17 MED ORDER — MELATONIN 5 MG PO TABS
10.0000 mg | ORAL_TABLET | Freq: Every day | ORAL | Status: DC
  Administered 2020-06-18 – 2020-06-19 (×3): 10 mg via ORAL
  Filled 2020-06-17 (×3): qty 2

## 2020-06-17 MED ORDER — APIXABAN 5 MG PO TABS: 5.0000 mg | ORAL_TABLET | Freq: Two times a day (BID) | ORAL | Status: DC

## 2020-06-17 MED ORDER — POLYETHYLENE GLYCOL 3350 17 G PO PACK
17.0000 g | PACK | Freq: Every day | ORAL | Status: DC
  Administered 2020-06-18 – 2020-06-19 (×3): 17 g via ORAL
  Filled 2020-06-17 (×3): qty 1

## 2020-06-17 MED ORDER — SODIUM CHLORIDE 0.9 % IV BOLUS
1000.0000 mL | Freq: Once | INTRAVENOUS | Status: AC
  Administered 2020-06-17: 1000 mL via INTRAVENOUS

## 2020-06-17 MED ORDER — SODIUM CHLORIDE 0.9 % IV SOLN
100.0000 mg | Freq: Two times a day (BID) | INTRAVENOUS | Status: DC
  Administered 2020-06-18 – 2020-06-20 (×6): 100 mg via INTRAVENOUS
  Filled 2020-06-17 (×8): qty 100

## 2020-06-17 MED ORDER — ONDANSETRON HCL 4 MG/2ML IJ SOLN: 4.0000 mg | Freq: Four times a day (QID) | INTRAMUSCULAR | Status: DC | PRN

## 2020-06-17 MED ORDER — FINASTERIDE 5 MG PO TABS
5.0000 mg | ORAL_TABLET | Freq: Every day | ORAL | Status: DC
  Administered 2020-06-18 – 2020-06-19 (×2): 5 mg via ORAL
  Filled 2020-06-17 (×2): qty 1

## 2020-06-17 MED ORDER — SODIUM CHLORIDE 0.9 % IV SOLN
2.0000 g | INTRAVENOUS | Status: DC
  Administered 2020-06-18: 2 g via INTRAVENOUS
  Filled 2020-06-17: qty 20

## 2020-06-17 MED ORDER — SILDENAFIL CITRATE 20 MG PO TABS
20.0000 mg | ORAL_TABLET | Freq: Two times a day (BID) | ORAL | Status: DC
  Administered 2020-06-18 – 2020-06-20 (×6): 20 mg via ORAL
  Filled 2020-06-17 (×7): qty 1

## 2020-06-17 MED ORDER — TAMSULOSIN HCL 0.4 MG PO CAPS
0.4000 mg | ORAL_CAPSULE | Freq: Every day | ORAL | Status: DC
  Administered 2020-06-18 – 2020-06-20 (×3): 0.4 mg via ORAL
  Filled 2020-06-17 (×4): qty 1

## 2020-06-17 MED ORDER — LACTULOSE 10 GM/15ML PO SOLN: 30.0000 g | Freq: Two times a day (BID) | ORAL | Status: DC | PRN

## 2020-06-17 MED ORDER — METHYLPREDNISOLONE SODIUM SUCC 40 MG IJ SOLR
40.0000 mg | Freq: Two times a day (BID) | INTRAMUSCULAR | Status: DC
  Administered 2020-06-18 – 2020-06-19 (×4): 40 mg via INTRAVENOUS
  Filled 2020-06-17 (×4): qty 1

## 2020-06-17 MED ORDER — MORPHINE SULFATE 10 MG/5ML PO SOLN
15.0000 mg | ORAL | Status: DC | PRN
  Administered 2020-06-18 – 2020-06-20 (×4): 15 mg via ORAL
  Filled 2020-06-17 (×4): qty 8

## 2020-06-17 MED ORDER — ENOXAPARIN SODIUM 40 MG/0.4ML ~~LOC~~ SOLN
40.0000 mg | SUBCUTANEOUS | Status: DC
  Administered 2020-06-18 – 2020-06-20 (×4): 40 mg via SUBCUTANEOUS
  Filled 2020-06-17 (×4): qty 0.4

## 2020-06-17 MED ORDER — VANCOMYCIN HCL 1250 MG/250ML IV SOLN
1250.0000 mg | Freq: Once | INTRAVENOUS | Status: DC
  Filled 2020-06-17: qty 250

## 2020-06-17 MED ORDER — IPRATROPIUM BROMIDE HFA 17 MCG/ACT IN AERS
2.0000 | INHALATION_SPRAY | Freq: Once | RESPIRATORY_TRACT | Status: AC
  Administered 2020-06-17: 2 via RESPIRATORY_TRACT
  Filled 2020-06-17: qty 12.9

## 2020-06-17 MED ORDER — PANTOPRAZOLE SODIUM 40 MG PO TBEC
40.0000 mg | DELAYED_RELEASE_TABLET | Freq: Two times a day (BID) | ORAL | Status: DC
  Administered 2020-06-18 – 2020-06-20 (×5): 40 mg via ORAL
  Filled 2020-06-17 (×5): qty 1

## 2020-06-17 MED ORDER — IPRATROPIUM-ALBUTEROL 0.5-2.5 (3) MG/3ML IN SOLN
3.0000 mL | Freq: Four times a day (QID) | RESPIRATORY_TRACT | Status: DC
  Administered 2020-06-18 – 2020-06-19 (×6): 3 mL via RESPIRATORY_TRACT
  Filled 2020-06-17 (×7): qty 3

## 2020-06-17 MED ORDER — METHYLPREDNISOLONE SODIUM SUCC 125 MG IJ SOLR
125.0000 mg | Freq: Once | INTRAMUSCULAR | Status: AC
  Administered 2020-06-17: 125 mg via INTRAVENOUS
  Filled 2020-06-17: qty 2

## 2020-06-17 MED ORDER — SENNA 8.6 MG PO TABS
2.0000 | ORAL_TABLET | Freq: Two times a day (BID) | ORAL | Status: DC
  Administered 2020-06-18 – 2020-06-20 (×6): 17.2 mg via ORAL
  Filled 2020-06-17 (×6): qty 2

## 2020-06-17 MED ORDER — PIPERACILLIN-TAZOBACTAM 3.375 G IVPB 30 MIN
3.3750 g | Freq: Once | INTRAVENOUS | Status: DC
  Filled 2020-06-17: qty 50

## 2020-06-17 MED ORDER — LORAZEPAM 0.5 MG PO TABS
0.5000 mg | ORAL_TABLET | Freq: Two times a day (BID) | ORAL | Status: DC | PRN
  Administered 2020-06-18 – 2020-06-20 (×4): 0.5 mg via ORAL
  Filled 2020-06-17 (×4): qty 1

## 2020-06-17 NOTE — H&P (Addendum)
History and Physical    Andrew [name] YYT:[id number] DOB: [birth year] DOA: 06/17/2020  PCP: Lujean Amel, MD  Patient coming from: Home with home hospice   Chief Complaint:  Chief Complaint  Patient presents with  . Possible Sepsis  . 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  Of note, patient was recently discharged from [facility] where he was hospitalized on on 7/11 and discharged 7/17. Patient was discharged on a prednisone steroid taper which the patient states that he completed on 7/30.  Patient claims that he woke up this morning suddenly feeling more short of breath than usual. Patient states that his shortness of breath initially mild to moderate intensity but rapidly became more severe as the day progressed. Shortness of breath was worse with exertion and improved with rest. Patient states that as his symptoms worsened concurrent generalized weakness. Patient also complains of associated generalized abdominal pain. Patient describes his pain as sore in quality and occurring with deep inspiration. Patient denies any chest pain, leg swelling or change in the frequency of his chronic cough. Patient denies any sick contacts, recent or confirmed contact with COVID-19 infection. Patient states that he has all of his medications including his maintenance inhalers.  Patient eventually presented to [facility] emergency department via EMS due to worsening symptoms. According to EMS, patient had a temperature of 102 F in route. Upon evaluation in the emergency department patient was administered 125 mg of Solu-Medrol as well as bronchodilator therapy with some improvement in his  symptoms. Chest x-ray revealed coarse markings at the bilateral bases with an interpretation of possible atelectasis versus pneumonia. Antibiotic therapy was ordered and the hospitalist group was called to assess the patient for admission the hospital.   Review of Systems: A 10-system review of systems has been performed and all systems are negative with the exception of what is listed in the HPI.    Past Medical History:  Diagnosis Date  . Anemia   . BiPAP (biphasic positive airway pressure) dependence    Pt denies history of OSA  . BPH (benign prostatic hyperplasia)   . Cancer ([place])    skin  . COPD (chronic obstructive pulmonary disease) ([place])   . Dysphagia   . Emphysema lung ([place])   . GERD (gastroesophageal reflux disease)    " Silent reflux"  . Hearing loss    right ear  . History of hiatal hernia   . HLD (hyperlipidemia)   . Hypertension   . Oxygen dependent    2-3 liters  . Oxygen dependent   . Pneumonia   . Pulmonary hypertension ([place])   . Sleep apnea    wears cpap  . TIA (transient ischemic attack) 04/27/2020    Past Surgical History:  Procedure Laterality Date  . CARDIAC CATHETERIZATION    . CATARACT EXTRACTION W/ INTRAOCULAR LENS  IMPLANT, BILATERAL    . COLONOSCOPY WITH PROPOFOL N/A 04/20/2018   Procedure: COLONOSCOPY WITH PROPOFOL;  Surgeon: Wilford Corner, MD;  Location: [place];  Service: Endoscopy;  Laterality: N/A;  . ESOPHAGOGASTRODUODENOSCOPY (EGD) WITH PROPOFOL N/A 08/23/2016   Procedure: ESOPHAGOGASTRODUODENOSCOPY (EGD) WITH PROPOFOL;  Surgeon: Wilford Corner, MD;  Location[facility] ENDOSCOPY;  Service: Endoscopy;  Laterality: N/A;  .  ESOPHAGOGASTRODUODENOSCOPY (EGD) WITH PROPOFOL N/A 04/20/2018   Procedure: ESOPHAGOGASTRODUODENOSCOPY (EGD) WITH PROPOFOL;  Surgeon: Wilford Corner, MD;  Location: [place];  Service: Endoscopy;  Laterality: N/A;  . HOT HEMOSTASIS N/A 04/20/2018   Procedure: HOT HEMOSTASIS (ARGON PLASMA COAGULATION/BICAP);  Surgeon:  Wilford Corner, MD;  Location: [place];  Service: Endoscopy;  Laterality: N/A;  . LUNG SURGERY    . POLYPECTOMY  04/20/2018   Procedure: POLYPECTOMY;  Surgeon: Wilford Corner, MD;  Location: [facility] ENDOSCOPY;  Service: Endoscopy;;  . TONSILLECTOMY       reports that he quit smoking about 35 years ago. His smoking use included cigarettes. He smoked 0.00 packs per day for 0.00 years. He has never used smokeless tobacco. He reports that he does not drink alcohol and does not use drugs.  No Known Allergies  Family History  Problem Relation Age of Onset  . Cancer Maternal Uncle   . Pancreatitis Mother   . Bone cancer Brother   . Stroke Maternal Grandfather   . Congestive Heart Failure Maternal Grandfather      Prior to Admission medications   Medication Sig Start Date End Date Taking? Authorizing Provider  albuterol (PROVENTIL HFA;VENTOLIN HFA) 108 (90 Base) MCG/ACT inhaler Inhale 2 puffs into the lungs every 6 (six) hours as needed for wheezing or shortness of breath. 04/17/17   Noralee Space, MD  apixaban (ELIQUIS) 5 MG TABS tablet Take 5 mg by mouth 2 (two) times daily. Patient not taking: Reported on 05/28/2020    [provider]  Ascorbic Acid (VITAMIN C) 1000 MG tablet Take 1,000 mg by mouth at bedtime.    [provider]  aspirin EC 81 MG tablet Take 81 mg by mouth daily.    [provider]  azithromycin (ZITHROMAX) 250 MG tablet Take 250 mg by mouth every Monday, Wednesday, and Friday. 05/10/20   [provider]  Cholecalciferol (VITAMIN D) 50 MCG (2000 UT) tablet Take 2,000 Units by mouth at bedtime.    [provider]  diltiazem (CARDIZEM CD) 180 MG 24 hr capsule TAKE 1 CAPSULE EVERY DAY (NEEDS OFFICE VISIT FOR FURTHER REFILLS) Patient taking differently: Take 180 mg by mouth at bedtime.  02/15/20   Skeet Latch, MD  finasteride (PROSCAR) 5 MG tablet Take 5 mg by mouth at bedtime.     [provider]  furosemide  (LASIX) 40 MG tablet Take 40 mg by mouth every Monday, Wednesday, and Friday.     [provider]  guaiFENesin (MUCINEX) 600 MG 12 hr tablet Take 1 tablet (600 mg total) by mouth 2 (two) times daily. Patient not taking: Reported on 05/28/2020 09/12/17   Lavina Hamman, MD  ipratropium-albuterol (DUONEB) 0.5-2.5 (3) MG/3ML SOLN Take 3 mLs by nebulization every 6 (six) hours as needed. Patient taking differently: Take 3 mLs by nebulization 4 (four) times daily.  03/15/20   Icard, Octavio Graves, DO  levalbuterol (XOPENEX) 0.63 MG/3ML nebulizer solution Take 3 mLs (0.63 mg total) by nebulization every 4 (four) hours as needed for wheezing or shortness of breath. 05/26/20   Martyn Ehrich, NP  LORazepam (ATIVAN) 0.5 MG tablet Take 0.5 mg by mouth See admin instructions. Take one tablet (0.5 mg) by mouth every 3 1/2 hours - scheduled 05/25/20   [provider]  metoCLOPramide (REGLAN) 10 MG tablet Take 10 mg by mouth 3 (three) times daily before meals. 05/22/20   [provider]  morphine (MSIR) 15 MG tablet Take 15 mg by mouth See admin  instructions. Take one tablet (15 mg) by mouth every 3 1/2 hours - scheduled    [provider]  omeprazole (PRILOSEC) 40 MG capsule Take 40 mg by mouth 2 (two) times daily.     [provider]  OXYGEN Inhale 3 L into the lungs continuous.     [provider]  polyethylene glycol (MIRALAX / GLYCOLAX) packet Take 17 g by mouth daily. Patient taking differently: Take 17 g by mouth at bedtime. Mix in 8 oz liquid and drink 05/08/16   Mesner, Corene Cornea, MD  potassium chloride 20 MEQ TBCR Take 20 mEq by mouth daily. 04/05/20   [facility], Tami Lin, PA  predniSONE (DELTASONE) 10 MG tablet Take 1 tablet (10 mg total) by mouth daily with breakfast. 06/17/20   Charlynne Cousins, MD  predniSONE (DELTASONE) 10 MG tablet Takes 6 tablets for 2 days, then 5 tablets for 2 days, then 4 tablets for 2 days, then 3 tablets for 2 days, then 2 tabs for 2  days, then 1 tab for 1 day. 06/03/20   Charlynne Cousins, MD  prochlorperazine (COMPAZINE) 10 MG tablet Take 10 mg by mouth every 4 (four) hours as needed for nausea or vomiting.  05/15/20   [provider]  rosuvastatin (CRESTOR) 10 MG tablet Take 10 mg by mouth at bedtime.     [provider]  senna (SENOKOT) 8.6 MG TABS tablet Take 2 tablets by mouth 2 (two) times daily. 05/17/20   [provider]  sildenafil (REVATIO) 20 MG tablet Take 1 tablet (20 mg total) by mouth 2 (two) times daily. 03/12/19   Fenton Foy, NP  Spacer/Aero-Holding Chambers (AEROCHAMBER MV) inhaler Use as instructed 12/03/18   Icard, Octavio Graves, DO  tamsulosin (FLOMAX) 0.4 MG CAPS capsule Take 1 capsule (0.4 mg total) by mouth daily after breakfast. 01/03/16   Nita Sells, MD  temazepam (RESTORIL) 30 MG capsule Take 1 capsule (30 mg total) by mouth at bedtime. 07/09/18   Noralee Space, MD    Physical Exam: Vitals:   06/17/20 2030 06/17/20 2100 06/17/20 2130 06/17/20 2138  BP: (!) 125/59 121/82 (!) 141/72   Pulse: 90 95 99   Resp: 17 15 (!) 24   Temp:    98.9 F (37.2 C)  TempSrc:    Oral  SpO2: 94% 91% 93%   Weight:      Height:        Constitutional: Acute alert and oriented x3, patient is in mild respiratory distress. Skin: no rashes, no lesions, poor skin turgor noted. Possible stage I pressure injury to bilateral buttocks and sacrum noted on admission. Eyes: Pupils are equally reactive to light.  No evidence of scleral icterus or conjunctival pallor.  ENMT: Moist mucous membranes noted.  Posterior pharynx clear of any exudate or lesions.   Neck: normal, supple, no masses, no thyromegaly.  No evidence of jugular venous distension.   Respiratory: Notable diminished breath sounds in all fields with prolonged expiratory phase and intermittent expiratory wheezing. No evidence of rales. Normal respiratory effort. No accessory muscle use.  Cardiovascular: Regular rate and rhythm,  no murmurs / rubs / gallops. No extremity edema. 2+ pedal pulses. No carotid bruits.  Chest:   Nontender without crepitus or deformity.   Back:   Nontender without crepitus or deformity. Abdomen: Notable generalized abdominal tenderness. Abdomen is soft however. No evidence of intra-abdominal masses.  Positive bowel sounds noted in all quadrants.   Musculoskeletal: No joint deformity upper and lower  extremities. Good ROM, no contractures. Normal muscle tone.  Neurologic: CN 2-12 grossly intact. Sensation intact, strength noted to be 5 out of 5 in all 4 extremities.  Patient is following all commands.  Patient is responsive to verbal stimuli.   Psychiatric: Patient presents as a normal mood with appropriate affect.  Patient seems to possess insight as to theircurrent situation.     Labs on Admission: I have personally reviewed following labs and imaging studies -   CBC: Recent Labs  Lab 06/17/20 1924 06/17/20 1942  WBC 15.8*  --   NEUTROABS 14.7*  --   HGB 12.3* 12.6*  HCT 38.3* 37.0*  MCV 101.9*  --   PLT 145*  --    Basic Metabolic Panel: Recent Labs  Lab 06/17/20 1924 06/17/20 1942  NA 140 139  K 3.9 3.8  CL 101  --   CO2 30  --   GLUCOSE 147*  --   BUN 17  --   CREATININE 0.92  --   CALCIUM 8.7*  --    GFR: Estimated Creatinine Clearance: 55.4 mL/min (by C-G formula based on SCr of 0.92 mg/dL). Liver Function Tests: Recent Labs  Lab 06/17/20 1924  AST 14*  ALT 22  ALKPHOS 51  BILITOT 0.6  PROT 5.6*  ALBUMIN 3.4*   No results for input(s): LIPASE, AMYLASE in the last 168 hours. No results for input(s): AMMONIA in the last 168 hours. Coagulation Profile: Recent Labs  Lab 06/17/20 1924  INR 1.2   Cardiac Enzymes: No results for input(s): CKTOTAL, CKMB, CKMBINDEX, TROPONINI in the last 168 hours. BNP (last 3 results) No results for input(s): PROBNP in the last 8760 hours. HbA1C: No results for input(s): HGBA1C in the last 72 hours. CBG: No results for  input(s): GLUCAP in the last 168 hours. Lipid Profile: No results for input(s): CHOL, HDL, LDLCALC, TRIG, CHOLHDL, LDLDIRECT in the last 72 hours. Thyroid Function Tests: No results for input(s): TSH, T4TOTAL, FREET4, T3FREE, THYROIDAB in the last 72 hours. Anemia Panel: No results for input(s): VITAMINB12, FOLATE, FERRITIN, TIBC, IRON, RETICCTPCT in the last 72 hours. Urine analysis:    Component Value Date/Time   COLORURINE STRAW (A) 04/26/2018 0330   APPEARANCEUR CLEAR 04/26/2018 0330   LABSPEC 1.008 04/26/2018 0330   PHURINE 6.0 04/26/2018 0330   GLUCOSEU NEGATIVE 04/26/2018 0330   HGBUR NEGATIVE 04/26/2018 0330   BILIRUBINUR NEGATIVE 04/26/2018 0330   KETONESUR 5 (A) 04/26/2018 0330   PROTEINUR NEGATIVE 04/26/2018 0330   NITRITE NEGATIVE 04/26/2018 0330   LEUKOCYTESUR NEGATIVE 04/26/2018 0330    Radiological Exams on Admission - Personally Reviewed: DG Chest Port 1 View  Result Date: 06/17/2020 CLINICAL DATA:  Questionable sepsis. EXAM: PORTABLE CHEST 1 VIEW COMPARISON:  Radiograph 05/28/2020. FINDINGS: Advanced emphysema with upper lobe bullous changes. Multiple skin folds project over the right upper hemithorax, 2 skin folds on image labeled 1 and a single skin fold on image labeled 2. The chain sutures noted in the right mid lung. Chronic right midlung scarring. Slight increase in bibasilar lung markings are nonspecific. Unchanged heart size and mediastinal contours. No significant pleural effusion. IMPRESSION: 1. Advanced emphysema with upper lobe bullous changes. 2. Slight increase in bibasilar lung markings, atelectasis versus infection. 3. Multiple skin folds over the right upper hemithorax. Electronically Signed   By: Keith Rake M.D.   On: 06/17/2020 19:53    EKG: Personally reviewed.  Rhythm is sinus tachycardia with heart rate of 104BPM.  No dynamic ST segment changes  appreciated.  Assessment/Plan Principal Problem:   Pneumonia of both lower lobes due to  infectious organism   My personal interpretation of the chest x-ray is that the patient is developing progressively worsening patchy bilateral lower lobe infiltrates. Due to the ambiguity of these infiltrates will obtain noncontrast CT imaging of the chest to confirm pneumonia.  The patient's presentation of multiple SIRS criteria including both leukocytosis and fevers per EMS will empirically place patient on intravenous antibiotic therapy with ceftriaxone and doxycycline (as opposed to Azithromycin which the patient is chronically on).  Providing patient with gentle intravenous hydration with isotonic fluids  Obtaining blood cultures  Treating current COPD exacerbation with systemic steroids with intravenous Solu-Medrol every 12 and aggressive bronchodilator therapy.  VBG performed in the emergency department does not reveal any evidence of acute on chronic respiratory failure.  Patient is chronically on 2 to 3 L of oxygen via nasal cannula at home, this will be titrated for bouts of hypoxia.  Active Problems:   COPD exacerbation ([place])   Please see assessment and plan above    Hypertension   Continue home regimen of diltiazem    Paroxysmal atrial fibrillation ([facility])   Continue home regimen of diltiazem  Patient is seemingly mostly rate controlled here in the emergency department  Continue to monitor patient on telemetry  Patient no longer takes Eliquis in the outpatient setting .     OSA on CPAP   Due to concerns for recurrent bilateral lower lobe pneumonia will abstain from CPAP for now. Supplemental oxygen with sleep. Patient can be transitioned back to CPAP nightly as tolerated.    Goals of care, counseling/discussion   Patient is currently enrolled in home hospice however patient confirms that he wishes to be hospitalized for his current condition.  Patient is awake alert and oriented and is able to make his own medical decisions at this time.  I confirmed with  the patient that he is DNR at the time of hospitalization    Candidal balanitis   Clinical evidence of candidal balanitis on examination with extension to the scrotum.  Will initiate clotrimazole twice daily for total of 7 days and monitor for symptomatic improvement.    Hyperlipidemia   Continue home regimen of statin therapy    GERD (gastroesophageal reflux disease)   Continue home regimen of PPI twice daily    Pressure sore on buttocks  Patient reports longstanding stage I injury of the bilateral buttocks. I have confirmed this on initial examination and ensure that we provide frequent turning of the patient to minimize pressure injury.   Code Status:  DNR Family Communication: Daughter is at the bedside and has been updated on plan of care.   Status is: Observation  The patient remains OBS appropriate and will d/c before 2 midnights.  Dispo: The patient is from: Home              Anticipated d/c is to: home with home hospice              Anticipated d/c date is: 2 days              Patient currently is not medically stable to d/c.        Vernelle Emerald MD [facility] Pager [phone]  If 7PM-7AM, please contact night-coverage [email or web address] Use universal  password for that web site. If you do not have the password, please call the hospital operator.  06/17/2020, 9:46 PM

## 2020-06-17 NOTE — ED Provider Notes (Signed)
[facility] EMERGENCY DEPARTMENT Provider Note   CSN: [id number] Arrival date & time: 06/17/20  1844     History Chief Complaint  Patient presents with  . Possible Sepsis  . SOB    Andrew [name] is a 79 y.o. male.  79 yo M with a cc of shortness of breath.  This been going on for about 48 hours.  Patient denies any significant cough denies chest pain or pressure has had some chronic abdominal pain for about 4 to 5 months off and on but none currently.  Ended up calling EMS.  Found to be hypoxic on his normal 4 L of oxygen and taken to the emergency department for evaluation.  Their thermometer reading was 102 orally.  Patient denies any noted fevers.  Denies chills.  Denies urinary symptoms.  The history is provided by the patient.  Illness Severity:  Moderate Onset quality:  Gradual Duration:  2 days Timing:  Constant Progression:  Worsening Chronicity:  New Associated symptoms: cough (chronic and unchanged) and shortness of breath   Associated symptoms: no abdominal pain, no chest pain, no congestion, no diarrhea, no fever, no headaches, no myalgias, no rash and no vomiting        Past Medical History:  Diagnosis Date  . Anemia   . BiPAP (biphasic positive airway pressure) dependence    Pt denies history of OSA  . BPH (benign prostatic hyperplasia)   . Cancer ([place])    skin  . COPD (chronic obstructive pulmonary disease) ([place])   . Dysphagia   . Emphysema lung ([place])   . GERD (gastroesophageal reflux disease)    " Silent reflux"  . Hearing loss    right ear  . History of hiatal hernia   . HLD (hyperlipidemia)   . Hypertension   . Oxygen dependent    2-3 liters  . Oxygen dependent   . Pneumonia   . Pulmonary hypertension ([place])   . Sleep apnea    wears cpap  . TIA (transient ischemic attack) 04/27/2020    Patient Active Problem List   Diagnosis Date Noted  . Candidal balanitis 06/17/2020  . Dyspnea   . COPD exacerbation ([place]) 05/28/2020    . Palliative care by specialist   . Goals of care, counseling/discussion   . DNR (do not resuscitate)   . TIA (transient ischemic attack) 04/27/2020  . Hospice care patient 03/27/2020  . Palliative care patient 03/27/2020  . Abdominal distention 02/10/2020  . Chronic anticoagulation 09/22/2019  . Healthcare maintenance 07/20/2019  . Immunosuppressed status ([place]) 12/03/2018  . Current chronic use of systemic steroids 12/03/2018  . Iron deficiency anemia, unspecified 04/20/2018  . PVCs (premature ventricular contractions) 02/02/2018  . Influenza-like illness 01/17/2018  . History of pneumonia 12/16/2017  . OSA on CPAP 09/22/2017  . Paroxysmal atrial fibrillation ([facility])   . Malnutrition of moderate degree 09/10/2017  . Protein-calorie malnutrition, severe 12/09/2016  . Dysphagia 08/23/2016  . Normocytic anemia 06/28/2016  . Pressure sore on buttocks 06/28/2016  . Pulmonary hypertension ([employer]) 02/20/2016  . GERD (gastroesophageal reflux disease) 02/20/2016  . COPD with acute exacerbation ([place]) 02/13/2016  . Acute on chronic respiratory failure with hypoxia ([place]) 02/12/2016  . 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 Hypertension 02/11/2016  . Hyperlipidemia 02/11/2016  . Pneumonia of both lower lobes due to infectious organism   . BPH (benign prostatic hyperplasia) 12/29/2015  . Chronic hypoxemic respiratory failure ([place]) 10/02/2015  . History of pulmonary hypertension 10/02/2015  Past Surgical History:  Procedure Laterality Date  . CARDIAC CATHETERIZATION    . CATARACT EXTRACTION W/ INTRAOCULAR LENS  IMPLANT, BILATERAL    . COLONOSCOPY WITH PROPOFOL N/A 04/20/2018   Procedure: COLONOSCOPY WITH PROPOFOL;  Surgeon: Wilford Corner, MD;  Location: [facility];  Service: Endoscopy;  Laterality: N/A;  . ESOPHAGOGASTRODUODENOSCOPY (EGD) WITH PROPOFOL N/A 08/23/2016   Procedure: ESOPHAGOGASTRODUODENOSCOPY (EGD) WITH PROPOFOL;  Surgeon: Wilford Corner, MD;  Location: [facility] ENDOSCOPY;   Service: Endoscopy;  Laterality: N/A;  . ESOPHAGOGASTRODUODENOSCOPY (EGD) WITH PROPOFOL N/A 04/20/2018   Procedure: ESOPHAGOGASTRODUODENOSCOPY (EGD) WITH PROPOFOL;  Surgeon: Wilford Corner, MD;  Location: [facility];  Service: Endoscopy;  Laterality: N/A;  . HOT HEMOSTASIS N/A 04/20/2018   Procedure: HOT HEMOSTASIS (ARGON PLASMA COAGULATION/BICAP);  Surgeon: Wilford Corner, MD;  Location: [facility];  Service: Endoscopy;  Laterality: N/A;  . LUNG SURGERY    . POLYPECTOMY  04/20/2018   Procedure: POLYPECTOMY;  Surgeon: Wilford Corner, MD;  Location: [facility] ENDOSCOPY;  Service: Endoscopy;;  . TONSILLECTOMY         Family History  Problem Relation Age of Onset  . Cancer Maternal Uncle   . Pancreatitis Mother   . Bone cancer Brother   . Stroke Maternal Grandfather   . Congestive Heart Failure Maternal Grandfather     Social History   Tobacco Use  . Smoking status: Former Smoker    Packs/day: 0.00    Years: 0.00    Pack years: 0.00    Types: Cigarettes    Quit date: 10/01/1984    Years since quitting: 35.7  . Smokeless tobacco: Never Used  Vaping Use  . Vaping Use: Never used  Substance Use Topics  . Alcohol use: No    Alcohol/week: 0.0 standard drinks  . Drug use: No    Home Medications Prior to Admission medications   Medication Sig Start Date End Date Taking? Authorizing Provider  albuterol (PROVENTIL HFA;VENTOLIN HFA) 108 (90 Base) MCG/ACT inhaler Inhale 2 puffs into the lungs every 6 (six) hours as needed for wheezing or shortness of breath. 04/17/17  Yes Noralee Space, MD  Ascorbic Acid (VITAMIN C) 1000 MG tablet Take 1,000 mg by mouth at bedtime.   Yes [provider]  aspirin EC 81 MG tablet Take 81 mg by mouth daily.   Yes [provider]  azithromycin (ZITHROMAX) 250 MG tablet Take 250 mg by mouth every Monday, Wednesday, and Friday. 05/10/20  Yes [provider]  Cholecalciferol (VITAMIN D) 50 MCG (2000 UT) tablet Take 2,000 Units by  mouth at bedtime.   Yes [provider]  diltiazem (CARDIZEM CD) 180 MG 24 hr capsule TAKE 1 CAPSULE EVERY DAY (NEEDS OFFICE VISIT FOR FURTHER REFILLS) Patient taking differently: Take 180 mg by mouth at bedtime.  02/15/20  Yes Skeet Latch, MD  finasteride (PROSCAR) 5 MG tablet Take 5 mg by mouth 2 (two) times daily.    Yes [provider]  furosemide (LASIX) 40 MG tablet Take 40 mg by mouth every Monday, Wednesday, and Friday.    Yes [provider]  ipratropium-albuterol (DUONEB) 0.5-2.5 (3) MG/3ML SOLN Take 3 mLs by nebulization every 6 (six) hours as needed. Patient taking differently: Take 3 mLs by nebulization 3 (three) times daily.  03/15/20  Yes Icard, Octavio Graves, DO  levalbuterol (XOPENEX) 0.63 MG/3ML nebulizer solution Take 3 mLs (0.63 mg total) by nebulization every 4 (four) hours as needed for wheezing or shortness of breath. Patient taking differently: Take 0.63 mg by nebulization 3 (  three) times daily.  05/26/20  Yes Martyn Ehrich, NP  LORazepam (ATIVAN) 0.5 MG tablet Take 0.5 mg by mouth every 4 (four) hours as needed for anxiety.  05/25/20  Yes [provider]  Melatonin 10 MG CAPS Take 1 capsule by mouth at bedtime. 06/13/20  Yes [provider]  metoCLOPramide (REGLAN) 10 MG tablet Take 10 mg by mouth 3 (three) times daily before meals. 05/22/20  Yes [provider]  morphine (MSIR) 15 MG tablet Take 15 mg by mouth every 4 (four) hours as needed (pain).    Yes [provider]  omeprazole (PRILOSEC) 40 MG capsule Take 40 mg by mouth 2 (two) times daily.    Yes [provider]  OVER THE COUNTER MEDICATION Take 1 capsule by mouth at bedtime. CBD oil   Yes [provider]  OXYGEN Inhale 3 L into the lungs continuous.    Yes [provider]  polyethylene glycol (MIRALAX / GLYCOLAX) packet Take 17 g by mouth daily. Patient taking differently: Take 17 g by mouth at bedtime. Mix in 8 oz liquid and  drink 05/08/16  Yes Mesner, Corene Cornea, MD  potassium chloride 20 MEQ TBCR Take 20 mEq by mouth daily. Patient taking differently: Take 20 mEq by mouth every Monday, Wednesday, and Friday.  04/05/20  Yes Duke, Tami Lin, PA  predniSONE (DELTASONE) 10 MG tablet Take 1 tablet (10 mg total) by mouth daily with breakfast. 06/17/20  Yes Charlynne Cousins, MD  prochlorperazine (COMPAZINE) 10 MG tablet Take 10 mg by mouth every 4 (four) hours as needed for nausea or vomiting.  05/15/20  Yes [provider]  rosuvastatin (CRESTOR) 10 MG tablet Take 10 mg by mouth at bedtime.    Yes [provider]  senna (SENOKOT) 8.6 MG TABS tablet Take 2 tablets by mouth 2 (two) times daily. 05/17/20  Yes [provider]  sildenafil (REVATIO) 20 MG tablet Take 1 tablet (20 mg total) by mouth 2 (two) times daily. 03/12/19  Yes Fenton Foy, NP  tamsulosin (FLOMAX) 0.4 MG CAPS capsule Take 1 capsule (0.4 mg total) by mouth daily after breakfast. 01/03/16  Yes Samtani, Jai-Gurmukh, MD  temazepam (RESTORIL) 30 MG capsule Take 1 capsule (30 mg total) by mouth at bedtime. 07/09/18  Yes Noralee Space, MD  apixaban (ELIQUIS) 5 MG TABS tablet Take 5 mg by mouth 2 (two) times daily. Patient not taking: Reported on 05/28/2020    [provider]  guaiFENesin (MUCINEX) 600 MG 12 hr tablet Take 1 tablet (600 mg total) by mouth 2 (two) times daily. Patient not taking: Reported on 06/17/2020 09/12/17   Lavina Hamman, MD  predniSONE (DELTASONE) 10 MG tablet Takes 6 tablets for 2 days, then 5 tablets for 2 days, then 4 tablets for 2 days, then 3 tablets for 2 days, then 2 tabs for 2 days, then 1 tab for 1 day. Patient not taking: Reported on 06/17/2020 06/03/20   Charlynne Cousins, MD  Spacer/Aero-Holding Chambers (AEROCHAMBER MV) inhaler Use as instructed 12/03/18   Garner Nash, DO    Allergies    Patient has no known allergies.  Review of Systems   Review of Systems  Constitutional: Negative  for chills and fever.  HENT: Negative for congestion and facial swelling.   Eyes: Negative for discharge and visual disturbance.  Respiratory: Positive for cough (chronic and unchanged) and shortness of breath.   Cardiovascular: Negative for chest pain and palpitations.  Gastrointestinal: Negative for  abdominal pain, diarrhea and vomiting.  Musculoskeletal: Negative for arthralgias and myalgias.  Skin: Negative for color change and rash.  Neurological: Negative for tremors, syncope and headaches.  Psychiatric/Behavioral: Negative for confusion and dysphoric mood.    Physical Exam Updated Vital Signs BP (!) 132/65 (BP Location: Right Arm)   Pulse 87   Temp 98.7 F (37.1 C) (Oral)   Resp 18   Ht 5\' 7"  (1.702 m)   Wt 61.2 kg   SpO2 98%   BMI 21.14 kg/m   Physical Exam Vitals and nursing note reviewed.  Constitutional:      Appearance: He is well-developed.  HENT:     Head: Normocephalic and atraumatic.  Eyes:     Pupils: Pupils are equal, round, and reactive to light.  Neck:     Vascular: No JVD.  Cardiovascular:     Rate and Rhythm: Normal rate and regular rhythm.     Heart sounds: No murmur heard.  No friction rub. No gallop.   Pulmonary:     Effort: No respiratory distress.     Breath sounds: Wheezing present.     Comments: Diminished breath with faint expiratory wheezing Abdominal:     General: There is no distension.     Tenderness: There is no abdominal tenderness. There is no guarding or rebound.  Musculoskeletal:        General: Normal range of motion.     Cervical back: Normal range of motion and neck supple.  Skin:    Coloration: Skin is not pale.     Findings: No rash.  Neurological:     Mental Status: He is alert and oriented to person, place, and time.  Psychiatric:        Behavior: Behavior normal.     ED Results / Procedures / Treatments   Labs (all labs ordered are listed, but only abnormal results are displayed) Labs Reviewed  COMPREHENSIVE  METABOLIC PANEL - Abnormal; Notable for the following components:      Result Value   Glucose, Bld 147 (*)    Calcium 8.7 (*)    Total Protein 5.6 (*)    Albumin 3.4 (*)    AST 14 (*)    All other components within normal limits  CBC WITH DIFFERENTIAL/PLATELET - Abnormal; Notable for the following components:   WBC 15.8 (*)    RBC 3.76 (*)    Hemoglobin 12.3 (*)    HCT 38.3 (*)    MCV 101.9 (*)    Platelets 145 (*)    Neutro Abs 14.7 (*)    Lymphs Abs 0.3 (*)    All other components within normal limits  URINALYSIS, ROUTINE W REFLEX MICROSCOPIC - Abnormal; Notable for the following components:   Glucose, UA 50 (*)    All other components within normal limits  I-STAT VENOUS BLOOD GAS, ED - Abnormal; Notable for the following components:   pH, Ven 7.505 (*)    pCO2, Ven 38.7 (*)    pO2, Ven 76.0 (*)    Bicarbonate 30.5 (*)    Acid-Base Excess 7.0 (*)    Calcium, Ion 1.08 (*)    HCT 37.0 (*)    Hemoglobin 12.6 (*)    All other components within normal limits  SARS CORONAVIRUS 2 BY RT PCR (HOSPITAL ORDER, [place] [facility])  CULTURE, BLOOD (SINGLE)  URINE CULTURE  EXPECTORATED SPUTUM ASSESSMENT W REFEX TO RESP CULTURE  LACTIC ACID, PLASMA  LACTIC ACID, PLASMA  PROTIME-INR  APTT  BRAIN NATRIURETIC PEPTIDE  PROCALCITONIN  HEMOGLOBIN A1C  CBC WITH DIFFERENTIAL/PLATELET  COMPREHENSIVE METABOLIC PANEL  MAGNESIUM  C-REACTIVE PROTEIN  TROPONIN I (HIGH SENSITIVITY)    EKG EKG Interpretation  Date/Time:  Saturday June 17 2020 18:51:59 EDT Ventricular Rate:  104 PR Interval:    QRS Duration: 86 QT Interval:  312 QTC Calculation: 411 R Axis:   4 Text Interpretation: Sinus tachycardia Ventricular premature complex Abnormal T, consider ischemia, lateral leads No significant change since last tracing Confirmed by Deno Etienne [phone]) on 06/17/2020 6:54:13 PM   Radiology CT CHEST WO CONTRAST  Result Date: 06/17/2020 CLINICAL DATA:  Multiple sirs criteria  without evidence of pneumonia on x-ray. Evaluate for pneumonia. EXAM: CT CHEST WITHOUT CONTRAST TECHNIQUE: Multidetector CT imaging of the chest was performed following the standard protocol without IV contrast. COMPARISON:  Radiograph earlier today.  CT 12/14/2018 FINDINGS: Cardiovascular: Aortic atherosclerosis. No aortic aneurysm. The heart is normal in size. Coronary artery calcifications. No pericardial effusion. Mediastinum/Nodes: Patulous esophagus without wall thickening. No visualized thyroid nodule. No mediastinal adenopathy. Calcified right hilar lymph nodes consistent with prior granulomatous disease. Limited assessment for noncalcified hilar adenopathy in the absence of IV contrast. Lungs/Pleura: Advanced emphysema with apical bulla. Multiple chain sutures in the right lung. Ill-defined patchy and nodular opacities within the right middle lobe. There are small air bronchograms and possible cavitary component, series 8, image 111. Adjacent tree-in-bud opacities. Ill-defined peribronchovascular opacities are also seen in the anteromedial right middle lobe, series 8, image 116. Areas of mucous plugging in the right lower lobe with mucoid impaction, series 8, image 132. Dependent opacity in the right lower lobe may be postobstructive or infectious. There is a small right pleural effusion. Pleural fluid and opacified bronchi outline blebs in the right lower lobe. There scattered calcified granulomas. Irregular opacity at the right lung apex is stable from prior exam most consistent with scarring. No focal airspace disease in the left lung or left pleural effusion. Upper Abdomen: Distended gallbladder with layering gallstones. Calcified granuloma in the spleen. Musculoskeletal: Posterior disc osteophyte complex at T7-T8 causes mild mass effect on the spinal canal. There are no acute or suspicious osseous abnormalities. IMPRESSION: 1. Ill-defined patchy and nodular opacities within the right middle lobe with  small air bronchograms and possible cavitary component. Adjacent tree-in-bud opacities. Findings most consistent with pneumonia. Recommend imaging follow-up after course of treatment to ensure resolution. This this area is difficult to accurately assess on radiograph, follow-up CT may be warranted. 2. Areas of mucous plugging in the right lower lobe with rather extensive mucoid impaction. Dependent opacity in the right lower lobe may be postobstructive or infectious. Small right pleural effusion. 3. Advanced emphysema with multifocal bulla and blebs. 4. Sequela of prior granulomatous disease with calcified granulomas and calcified right hilar lymph nodes. 5. Distended gallbladder with layering gallstones. Aortic Atherosclerosis (ICD10-I70.0) and Emphysema (ICD10-J43.9). Electronically Signed   By: Keith Rake M.D.   On: 06/17/2020 22:11   DG Chest Port 1 View  Result Date: 06/17/2020 CLINICAL DATA:  Questionable sepsis. EXAM: PORTABLE CHEST 1 VIEW COMPARISON:  Radiograph 05/28/2020. FINDINGS: Advanced emphysema with upper lobe bullous changes. Multiple skin folds project over the right upper hemithorax, 2 skin folds on image labeled 1 and a single skin fold on image labeled 2. The chain sutures noted in the right mid lung. Chronic right midlung scarring. Slight increase in bibasilar lung markings are nonspecific. Unchanged heart size and mediastinal contours. No significant pleural effusion. IMPRESSION: 1. Advanced emphysema with  upper lobe bullous changes. 2. Slight increase in bibasilar lung markings, atelectasis versus infection. 3. Multiple skin folds over the right upper hemithorax. Electronically Signed   By: Keith Rake M.D.   On: 06/17/2020 19:53    Procedures Procedures (including critical care time)  Medications Ordered in ED Medications  0.9 % NaCl with KCl 20 mEq/ L  infusion (has no administration in time range)  clotrimazole (LOTRIMIN) 1 % cream (has no administration in time  range)  cefTRIAXone (ROCEPHIN) 2 g in sodium chloride 0.9 % 100 mL IVPB (has no administration in time range)  doxycycline (VIBRAMYCIN) 100 mg in sodium chloride 0.9 % 250 mL IVPB (has no administration in time range)  ipratropium-albuterol (DUONEB) 0.5-2.5 (3) MG/3ML nebulizer solution 3 mL (has no administration in time range)  ipratropium-albuterol (DUONEB) 0.5-2.5 (3) MG/3ML nebulizer solution 3 mL (has no administration in time range)  aspirin EC tablet 81 mg (has no administration in time range)  diltiazem (CARDIZEM CD) 24 hr capsule 180 mg (has no administration in time range)  rosuvastatin (CRESTOR) tablet 10 mg (has no administration in time range)  sildenafil (REVATIO) tablet 20 mg (has no administration in time range)  LORazepam (ATIVAN) tablet 0.5 mg (has no administration in time range)  methylPREDNISolone sodium succinate (SOLU-MEDROL) 40 mg/mL injection 40 mg (has no administration in time range)  metoCLOPramide (REGLAN) tablet 10 mg (has no administration in time range)  pantoprazole (PROTONIX) EC tablet 40 mg (has no administration in time range)  polyethylene glycol (MIRALAX / GLYCOLAX) packet 17 g (has no administration in time range)  senna (SENOKOT) tablet 17.2 mg (has no administration in time range)  finasteride (PROSCAR) tablet 5 mg (has no administration in time range)  tamsulosin (FLOMAX) capsule 0.4 mg (has no administration in time range)  ondansetron (ZOFRAN) injection 4 mg (has no administration in time range)  acetaminophen (TYLENOL) tablet 650 mg (has no administration in time range)  lactulose (CHRONULAC) 10 GM/15ML solution 30 g (has no administration in time range)  melatonin tablet 10 mg (has no administration in time range)  morphine 10 MG/5ML solution 15 mg (has no administration in time range)  enoxaparin (LOVENOX) injection 40 mg (has no administration in time range)  albuterol (VENTOLIN HFA) 108 (90 Base) MCG/ACT inhaler 8 puff (8 puffs Inhalation Given  06/17/20 2129)  ipratropium (ATROVENT HFA) inhaler 2 puff (2 puffs Inhalation Given 06/17/20 1951)  methylPREDNISolone sodium succinate (SOLU-MEDROL) 125 mg/2 mL injection 125 mg (125 mg Intravenous Given 06/17/20 1946)  magnesium sulfate IVPB 2 g 50 mL (0 g Intravenous Stopped 06/17/20 2044)  sodium chloride 0.9 % bolus 1,000 mL (0 mLs Intravenous Stopped 06/17/20 2129)    ED Course  I have reviewed the triage vital signs and the nursing notes.  Pertinent labs & imaging results that were available during my care of the patient were reviewed by me and considered in my medical decision making (see chart for details).    MDM Rules/Calculators/A&P                          79 yo M with a chief complaint found to be febrile by EMS.  Will obtain a laboratory evaluation.  Chest x-ray.  Give breathing treatments.  Solu-Medrol magnesium and reassess.  Patient with improvement but doesn't feel well enough to go home.  With fever and possible pna on cxr as viewed by me will start on abx.  Admit.   CRITICAL CARE Performed  by: Cecilio Asper   Total critical care time: 35 minutes  Critical care time was exclusive of separately billable procedures and treating other patients.  Critical care was necessary to treat or prevent imminent or life-threatening deterioration.  Critical care was time spent personally by me on the following activities: development of treatment plan with patient and/or surrogate as well as nursing, discussions with consultants, evaluation of patient's response to treatment, examination of patient, obtaining history from patient or surrogate, ordering and performing treatments and interventions, ordering and review of laboratory studies, ordering and review of radiographic studies, pulse oximetry and re-evaluation of patient's condition.  The patients results and plan were reviewed and discussed.   Any x-rays performed were independently reviewed by myself.   Differential  diagnosis were considered with the presenting HPI.  Medications  0.9 % NaCl with KCl 20 mEq/ L  infusion (has no administration in time range)  clotrimazole (LOTRIMIN) 1 % cream (has no administration in time range)  cefTRIAXone (ROCEPHIN) 2 g in sodium chloride 0.9 % 100 mL IVPB (has no administration in time range)  doxycycline (VIBRAMYCIN) 100 mg in sodium chloride 0.9 % 250 mL IVPB (has no administration in time range)  ipratropium-albuterol (DUONEB) 0.5-2.5 (3) MG/3ML nebulizer solution 3 mL (has no administration in time range)  ipratropium-albuterol (DUONEB) 0.5-2.5 (3) MG/3ML nebulizer solution 3 mL (has no administration in time range)  aspirin EC tablet 81 mg (has no administration in time range)  diltiazem (CARDIZEM CD) 24 hr capsule 180 mg (has no administration in time range)  rosuvastatin (CRESTOR) tablet 10 mg (has no administration in time range)  sildenafil (REVATIO) tablet 20 mg (has no administration in time range)  LORazepam (ATIVAN) tablet 0.5 mg (has no administration in time range)  methylPREDNISolone sodium succinate (SOLU-MEDROL) 40 mg/mL injection 40 mg (has no administration in time range)  metoCLOPramide (REGLAN) tablet 10 mg (has no administration in time range)  pantoprazole (PROTONIX) EC tablet 40 mg (has no administration in time range)  polyethylene glycol (MIRALAX / GLYCOLAX) packet 17 g (has no administration in time range)  senna (SENOKOT) tablet 17.2 mg (has no administration in time range)  finasteride (PROSCAR) tablet 5 mg (has no administration in time range)  tamsulosin (FLOMAX) capsule 0.4 mg (has no administration in time range)  ondansetron (ZOFRAN) injection 4 mg (has no administration in time range)  acetaminophen (TYLENOL) tablet 650 mg (has no administration in time range)  lactulose (CHRONULAC) 10 GM/15ML solution 30 g (has no administration in time range)  melatonin tablet 10 mg (has no administration in time range)  morphine 10 MG/5ML  solution 15 mg (has no administration in time range)  enoxaparin (LOVENOX) injection 40 mg (has no administration in time range)  albuterol (VENTOLIN HFA) 108 (90 Base) MCG/ACT inhaler 8 puff (8 puffs Inhalation Given 06/17/20 2129)  ipratropium (ATROVENT HFA) inhaler 2 puff (2 puffs Inhalation Given 06/17/20 1951)  methylPREDNISolone sodium succinate (SOLU-MEDROL) 125 mg/2 mL injection 125 mg (125 mg Intravenous Given 06/17/20 1946)  magnesium sulfate IVPB 2 g 50 mL (0 g Intravenous Stopped 06/17/20 2044)  sodium chloride 0.9 % bolus 1,000 mL (0 mLs Intravenous Stopped 06/17/20 2129)    Vitals:   06/17/20 2100 06/17/20 2130 06/17/20 2138 06/17/20 2229  BP: 121/82 (!) 141/72  (!) 132/65  Pulse: 95 99  87  Resp: 15 (!) 24  18  Temp:   98.9 F (37.2 C) 98.7 F (37.1 C)  TempSrc:   Oral Oral  SpO2: 91%  93%  98%  Weight:      Height:        Final diagnoses:  COPD with acute exacerbation ([place])  Acute on chronic respiratory failure with hypoxia ([place])    Admission/ observation were discussed with the admitting physician, patient and/or family and they are comfortable with the plan.     Final Clinical Impression(s) / ED Diagnoses Final diagnoses:  COPD with acute exacerbation ([place])  Acute on chronic respiratory failure with hypoxia [facility])    Rx / DC Orders ED Discharge Orders    None       Deno Etienne, DO 06/17/20 2323

## 2020-06-17 NOTE — ED Triage Notes (Signed)
Pt arrived to ED via [facility] d/t reports of SOB, & possible sepsis. Initially pt states that his chief complaints was that he has been constipated for 5 days with no BM & his abd is tender. EMS reports that their main concern is that pt was showing signs of possible sepsis with 102 oral Temp., 110 bpm pulse, 26 resp/min, and burning when he urinates (recent UTI may have not went away- per EMS) Pt was also SOB, COPD at baseline with 3L home O2 continuously. Pt arrived to ED on 15L O2 via NRB, O2 was decreased to 5L via n/c & pt sat at 96%. Pt is A/Ox4, verbal-able to make needs known. Pt reportrs seeing a hospice nurse in home a few times a week & that he is a DNR.

## 2020-06-18 DIAGNOSIS — D649 Anemia, unspecified: Secondary | ICD-10-CM | POA: Diagnosis not present

## 2020-06-18 DIAGNOSIS — I4891 Unspecified atrial fibrillation: Secondary | ICD-10-CM | POA: Diagnosis not present

## 2020-06-18 DIAGNOSIS — G4733 Obstructive sleep apnea (adult) (pediatric): Secondary | ICD-10-CM | POA: Diagnosis not present

## 2020-06-18 DIAGNOSIS — I10 Essential (primary) hypertension: Secondary | ICD-10-CM | POA: Diagnosis not present

## 2020-06-18 DIAGNOSIS — Z87891 Personal history of nicotine dependence: Secondary | ICD-10-CM | POA: Diagnosis not present

## 2020-06-18 DIAGNOSIS — J432 Centrilobular emphysema: Secondary | ICD-10-CM | POA: Diagnosis not present

## 2020-06-18 DIAGNOSIS — J439 Emphysema, unspecified: Secondary | ICD-10-CM | POA: Diagnosis present

## 2020-06-18 DIAGNOSIS — Z823 Family history of stroke: Secondary | ICD-10-CM | POA: Diagnosis not present

## 2020-06-18 DIAGNOSIS — Z961 Presence of intraocular lens: Secondary | ICD-10-CM | POA: Diagnosis present

## 2020-06-18 DIAGNOSIS — L309 Dermatitis, unspecified: Secondary | ICD-10-CM | POA: Diagnosis not present

## 2020-06-18 DIAGNOSIS — E785 Hyperlipidemia, unspecified: Secondary | ICD-10-CM | POA: Diagnosis not present

## 2020-06-18 DIAGNOSIS — K449 Diaphragmatic hernia without obstruction or gangrene: Secondary | ICD-10-CM | POA: Diagnosis not present

## 2020-06-18 DIAGNOSIS — Z9842 Cataract extraction status, left eye: Secondary | ICD-10-CM | POA: Diagnosis not present

## 2020-06-18 DIAGNOSIS — B3742 Candidal balanitis: Secondary | ICD-10-CM | POA: Diagnosis present

## 2020-06-18 DIAGNOSIS — Z8249 Family history of ischemic heart disease and other diseases of the circulatory system: Secondary | ICD-10-CM | POA: Diagnosis not present

## 2020-06-18 DIAGNOSIS — I480 Paroxysmal atrial fibrillation: Secondary | ICD-10-CM | POA: Diagnosis present

## 2020-06-18 DIAGNOSIS — I272 Pulmonary hypertension, unspecified: Secondary | ICD-10-CM | POA: Diagnosis not present

## 2020-06-18 DIAGNOSIS — J441 Chronic obstructive pulmonary disease with (acute) exacerbation: Secondary | ICD-10-CM | POA: Diagnosis not present

## 2020-06-18 DIAGNOSIS — Z7952 Long term (current) use of systemic steroids: Secondary | ICD-10-CM | POA: Diagnosis not present

## 2020-06-18 DIAGNOSIS — J9621 Acute and chronic respiratory failure with hypoxia: Secondary | ICD-10-CM | POA: Diagnosis not present

## 2020-06-18 DIAGNOSIS — Z66 Do not resuscitate: Secondary | ICD-10-CM | POA: Diagnosis not present

## 2020-06-18 DIAGNOSIS — J9611 Chronic respiratory failure with hypoxia: Secondary | ICD-10-CM | POA: Diagnosis not present

## 2020-06-18 DIAGNOSIS — Z9841 Cataract extraction status, right eye: Secondary | ICD-10-CM | POA: Diagnosis not present

## 2020-06-18 DIAGNOSIS — K219 Gastro-esophageal reflux disease without esophagitis: Secondary | ICD-10-CM | POA: Diagnosis not present

## 2020-06-18 DIAGNOSIS — Z515 Encounter for palliative care: Secondary | ICD-10-CM

## 2020-06-18 DIAGNOSIS — L89151 Pressure ulcer of sacral region, stage 1: Secondary | ICD-10-CM | POA: Diagnosis present

## 2020-06-18 DIAGNOSIS — Z8673 Personal history of transient ischemic attack (TIA), and cerebral infarction without residual deficits: Secondary | ICD-10-CM | POA: Diagnosis not present

## 2020-06-18 DIAGNOSIS — Z6822 Body mass index (BMI) 22.0-22.9, adult: Secondary | ICD-10-CM | POA: Diagnosis not present

## 2020-06-18 DIAGNOSIS — N400 Enlarged prostate without lower urinary tract symptoms: Secondary | ICD-10-CM | POA: Diagnosis not present

## 2020-06-18 DIAGNOSIS — N3281 Overactive bladder: Secondary | ICD-10-CM | POA: Diagnosis not present

## 2020-06-18 DIAGNOSIS — Z9981 Dependence on supplemental oxygen: Secondary | ICD-10-CM | POA: Diagnosis not present

## 2020-06-18 DIAGNOSIS — Z7189 Other specified counseling: Secondary | ICD-10-CM | POA: Diagnosis not present

## 2020-06-18 DIAGNOSIS — J189 Pneumonia, unspecified organism: Secondary | ICD-10-CM | POA: Diagnosis not present

## 2020-06-18 DIAGNOSIS — Z7982 Long term (current) use of aspirin: Secondary | ICD-10-CM | POA: Diagnosis not present

## 2020-06-18 DIAGNOSIS — Z20822 Contact with and (suspected) exposure to covid-19: Secondary | ICD-10-CM | POA: Diagnosis present

## 2020-06-18 LAB — PROCALCITONIN: Procalcitonin: 0.23 ng/mL

## 2020-06-18 LAB — CBC WITH DIFFERENTIAL/PLATELET
Abs Immature Granulocytes: 0.09 10*3/uL — ABNORMAL HIGH (ref 0.00–0.07)
Basophils Absolute: 0 10*3/uL (ref 0.0–0.1)
Basophils Relative: 0 %
Eosinophils Absolute: 0 10*3/uL (ref 0.0–0.5)
Eosinophils Relative: 0 %
HCT: 36 % — ABNORMAL LOW (ref 39.0–52.0)
Hemoglobin: 11.7 g/dL — ABNORMAL LOW (ref 13.0–17.0)
Immature Granulocytes: 1 %
Lymphocytes Relative: 1 %
Lymphs Abs: 0.3 10*3/uL — ABNORMAL LOW (ref 0.7–4.0)
MCH: 32.7 pg (ref 26.0–34.0)
MCHC: 32.5 g/dL (ref 30.0–36.0)
MCV: 100.6 fL — ABNORMAL HIGH (ref 80.0–100.0)
Monocytes Absolute: 0.1 10*3/uL (ref 0.1–1.0)
Monocytes Relative: 1 %
Neutro Abs: 17.7 10*3/uL — ABNORMAL HIGH (ref 1.7–7.7)
Neutrophils Relative %: 97 %
Platelets: 137 10*3/uL — ABNORMAL LOW (ref 150–400)
RBC: 3.58 MIL/uL — ABNORMAL LOW (ref 4.22–5.81)
RDW: 12.4 % (ref 11.5–15.5)
WBC: 18.2 10*3/uL — ABNORMAL HIGH (ref 4.0–10.5)
nRBC: 0 % (ref 0.0–0.2)

## 2020-06-18 LAB — COMPREHENSIVE METABOLIC PANEL
ALT: 20 U/L (ref 0–44)
AST: 16 U/L (ref 15–41)
Albumin: 3.2 g/dL — ABNORMAL LOW (ref 3.5–5.0)
Alkaline Phosphatase: 48 U/L (ref 38–126)
Anion gap: 9 (ref 5–15)
BUN: 17 mg/dL (ref 8–23)
CO2: 27 mmol/L (ref 22–32)
Calcium: 8.5 mg/dL — ABNORMAL LOW (ref 8.9–10.3)
Chloride: 103 mmol/L (ref 98–111)
Creatinine, Ser: 0.84 mg/dL (ref 0.61–1.24)
GFR calc Af Amer: >60 mL/min (ref >60)
GFR calc non Af Amer: >60 mL/min (ref >60)
Glucose, Bld: 164 mg/dL — ABNORMAL HIGH (ref 70–99)
Potassium: 4.4 mmol/L (ref 3.5–5.1)
Sodium: 139 mmol/L (ref 135–145)
Total Bilirubin: 0.7 mg/dL (ref 0.3–1.2)
Total Protein: 5.5 g/dL — ABNORMAL LOW (ref 6.5–8.1)

## 2020-06-18 LAB — HEMOGLOBIN A1C
Hgb A1c MFr Bld: 5.8 % — ABNORMAL HIGH (ref 4.8–5.6)
Mean Plasma Glucose: 119.76 mg/dL

## 2020-06-18 LAB — C-REACTIVE PROTEIN: CRP: 9.6 mg/dL — ABNORMAL HIGH (ref <1.0)

## 2020-06-18 LAB — MAGNESIUM: Magnesium: 2.6 mg/dL — ABNORMAL HIGH (ref 1.7–2.4)

## 2020-06-18 MED ORDER — GUAIFENESIN ER 600 MG PO TB12
1200.0000 mg | ORAL_TABLET | Freq: Two times a day (BID) | ORAL | Status: DC
  Administered 2020-06-18 – 2020-06-20 (×5): 1200 mg via ORAL
  Filled 2020-06-18 (×5): qty 2

## 2020-06-18 MED ORDER — SODIUM CHLORIDE 0.9 % IV SOLN
2.0000 g | Freq: Two times a day (BID) | INTRAVENOUS | Status: DC
  Administered 2020-06-18 – 2020-06-20 (×4): 2 g via INTRAVENOUS
  Filled 2020-06-18 (×5): qty 2

## 2020-06-18 MED ORDER — FLUTICASONE PROPIONATE 50 MCG/ACT NA SUSP
1.0000 | Freq: Every day | NASAL | Status: DC
  Administered 2020-06-18 – 2020-06-20 (×3): 1 via NASAL
  Filled 2020-06-18: qty 16

## 2020-06-18 NOTE — Progress Notes (Addendum)
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 fib, obstructive sleep apnea, GERD who presented to [facility] on complaining of shortness of breath.  Patient recently hospitalized on 7/11 until 7/17, discharged on prednisone taper. He related that he wake up with worsening shortness of breath, cough.  He also reports generalized weakness.  Generalized abdominal pain.  Denies chest pain. Evaluation in the ED patient was found to be febrile temperature 102, he received IV Solu-Medrol, nebulizer and IV antibiotics.  Chest x-ray revealed coarse marking at the bilateral bases concerning with possible atelectasis versus pneumonia.     Assessment & Plan:   Principal Problem:   Pneumonia of both lower lobes due to infectious organism Active Problems:   Hypertension   Hyperlipidemia   GERD (gastroesophageal reflux disease)   Pressure sore on buttocks   Paroxysmal atrial fibrillation (HCC)   OSA on CPAP   COPD exacerbation (HCC)   Goals of care, counseling/discussion   Candidal balanitis  1-Pneumonia bilateral lower lobe: Continue with IV antibiotics. CT chest: Nodular opacity within the right middle lobe with a small air bronchograms and possible cavitary component.  Adjacent tree-in-bud opacity.  Consistent with pneumonia.  Need follow-up CT chest mucous plugging in the right lower lobe.  Dependent opacity in the right lower lobe may be post obstructive or infectious.  Advanced emphysema with multifocal poorly bleb Continue with  Doxycycline.  At risk for Pseudomonas change ceftriaxone to cefepime Follow blood cultures Postnasal drip: Start Flonase  COPD exacerbation: Continue with low-dose Solu-Medrol Continue with nebulizer. Resume  guaifenesin  Chronic hypoxic respiratory failure: Continue with oxygen supplementation  Hypertension: Continue with diltiazem  Candida Balanitis; : Continue with clotrimazole twice daily.  HLD;  Continue with statins.  GERD: Continue with PPI.  Pressure Injury 12/27/18 Stage I -  Intact skin with non-blanchable redness of a localized area usually over a bony prominence. Small red area on left buttock (Active)  12/27/18 0629  Location: Buttocks  Location Orientation: Left  Staging: Stage I -  Intact skin with non-blanchable redness of a localized area usually over a bony prominence.  Wound Description (Comments): Small red area on left buttock  Present on Admission: Yes     Pressure Injury 06/17/20 Sacrum Right;Left Stage 1 -  Intact skin with non-blanchable redness of a localized area usually over a bony prominence. (Active)  06/17/20 2230  Location: Sacrum  Location Orientation: Right;Left  Staging: Stage 1 -  Intact skin with non-blanchable redness of a localized area usually over a bony prominence.  Wound Description (Comments):   Present on Admission: Yes                  Estimated body mass index is 20.65 kg/m as calculated from the following:   Height as of this encounter: 5\' 7"  (1.702 m).   Weight as of this encounter: 59.8 kg.   DVT prophylaxis: Lovenox Code Status: DNR Family Communication: Discussed with patient Disposition Plan:  Status is: Inpatient  Dispo: The patient is from: Home              Anticipated d/c is to: Home              Anticipated d/c date is: 2 days  Patient currently is not medically stable to d/c.        Consultants:  Palliative Procedures:   None  Antimicrobials:  Ceftriaxone and doxycycline  Subjective: He is feeling better than yesterday, still having cough and shortness of breath.  Report postnasal drip  Objective: Vitals:   06/17/20 2138 06/17/20 2229 06/18/20 0237 06/18/20 0636  BP:  (!)  132/65 111/76   Pulse:  87 68   Resp:  18 18   Temp: 98.9 F (37.2 C) 98.7 F (37.1 C) 97.8 F (36.6 C) 98.4 F (36.9 C)  TempSrc: Oral Oral Oral Oral  SpO2:  98% 98%   Weight:  59.8 kg    Height:  5\' 7"  (1.702 m)      Intake/Output Summary (Last 24 hours) at 06/18/2020 0743 Last data filed at 06/18/2020 0600 Gross per 24 hour  Intake 1719.65 ml  Output 200 ml  Net 1519.65 ml   Filed Weights   06/17/20 1958 06/17/20 2229  Weight: 61.2 kg 59.8 kg    Examination:  General exam: Appears calm and comfortable  Respiratory system: Bilateral rhonchorous, no wheezing. Respiratory effort normal. Cardiovascular system: S1 & S2 heard, RRR. No JVD, murmurs, rubs, gallops or clicks. No pedal edema. Gastrointestinal system: Abdomen is nondistended, soft and nontender. No organomegaly or masses felt. Normal bowel sounds heard. Central nervous system: Alert and oriented. No focal neurological deficits. Extremities: Symmetric 5 x 5 power. Skin: No rashes, lesions or ulcers Psychiatry: Judgement and insight appear normal. Mood & affect appropriate.     Data Reviewed: I have personally reviewed following labs and imaging studies  CBC: Recent Labs  Lab 06/17/20 1924 06/17/20 1942 06/18/20 0417  WBC 15.8*  --  18.2*  NEUTROABS 14.7*  --  17.7*  HGB 12.3* 12.6* 11.7*  HCT 38.3* 37.0* 36.0*  MCV 101.9*  --  100.6*  PLT 145*  --  270*   Basic Metabolic Panel: Recent Labs  Lab 06/17/20 1924 06/17/20 1942 06/18/20 0417  NA 140 139 139  K 3.9 3.8 4.4  CL 101  --  103  CO2 30  --  27  GLUCOSE 147*  --  164*  BUN 17  --  17  CREATININE 0.92  --  0.84  CALCIUM 8.7*  --  8.5*  MG  --   --  2.6*   GFR: Estimated Creatinine Clearance: 59.3 mL/min (by C-G formula based on SCr of 0.84 mg/dL). Liver Function Tests: Recent Labs  Lab 06/17/20 1924 06/18/20 0417  AST 14* 16  ALT 22 20  ALKPHOS 51 48  BILITOT 0.6 0.7  PROT 5.6* 5.5*  ALBUMIN 3.4* 3.2*   No results for  input(s): LIPASE, AMYLASE in the last 168 hours. No results for input(s): AMMONIA in the last 168 hours. Coagulation Profile: Recent Labs  Lab 06/17/20 1924  INR 1.2   Cardiac Enzymes: No results for input(s): CKTOTAL, CKMB, CKMBINDEX, TROPONINI in the last 168 hours. BNP (last 3 results) No results for input(s): PROBNP in the last 8760 hours. HbA1C: Recent Labs    06/18/20 0417  HGBA1C 5.8*   CBG: No results for input(s): GLUCAP in the last 168 hours. Lipid Profile: No results for input(s): CHOL, HDL, LDLCALC, TRIG, CHOLHDL, LDLDIRECT in the last 72 hours. Thyroid Function Tests: No results for input(s): TSH, T4TOTAL, FREET4, T3FREE, THYROIDAB in the last 72 hours. Anemia Panel: No results for input(s): VITAMINB12, FOLATE, FERRITIN, TIBC, IRON, RETICCTPCT in the last 72 hours. Sepsis Labs: Recent Labs  Lab 06/17/20 1924 06/17/20 2100 06/18/20 0417  PROCALCITON  --   --  0.23  LATICACIDVEN 0.8 0.7  --     Recent Results (from the past 240 hour(s))  SARS Coronavirus 2 by RT PCR (hospital order, performed in [facility] hospital lab) Nasopharyngeal Nasopharyngeal Swab     Status: None   Collection Time: 06/17/20  7:52 PM   Specimen: Nasopharyngeal Swab  Result Value Ref Range Status   SARS Coronavirus 2 NEGATIVE NEGATIVE Final    Comment: (NOTE) SARS-CoV-2 target nucleic acids are NOT DETECTED.  The SARS-CoV-2 RNA is generally detectable in upper and lower respiratory specimens during the acute phase of infection. The lowest concentration of SARS-CoV-2 viral copies this assay can detect is 250 copies / mL. A negative result does not preclude SARS-CoV-2 infection and should not be used as the sole basis for treatment or other patient management decisions.  A negative result may occur with improper specimen collection / handling, submission of specimen other than nasopharyngeal swab, presence of viral mutation(s) within the areas targeted by this assay, and inadequate  number of viral copies (<250 copies / mL). A negative result must be combined with clinical observations, patient history, and epidemiological information.  Fact Sheet for Patients:   StrictlyIdeas.no  Fact Sheet for Healthcare Providers: BankingDealers.co.za  This test is not yet approved or  cleared by the [place] FDA and has been authorized for detection and/or diagnosis of SARS-CoV-2 by FDA under an Emergency Use Authorization (EUA).  This EUA will remain in effect (meaning this test can be used) for the duration of the COVID-19 declaration under Section 564(b)(1) of the Act, 21 U.S.C. section 360bbb-3(b)(1), unless the authorization is terminated or revoked sooner.  Performed at [facility], [place] [address] [place], [place] [address]          Radiology Studies: CT CHEST WO CONTRAST  Result Date: 06/17/2020 CLINICAL DATA:  Multiple sirs criteria without evidence of pneumonia on x-ray. Evaluate for pneumonia. EXAM: CT CHEST WITHOUT CONTRAST TECHNIQUE: Multidetector CT imaging of the chest was performed following the standard protocol without IV contrast. COMPARISON:  Radiograph earlier today.  CT 12/14/2018 FINDINGS: Cardiovascular: Aortic atherosclerosis. No aortic aneurysm. The heart is normal in size. Coronary artery calcifications. No pericardial effusion. Mediastinum/Nodes: Patulous esophagus without wall thickening. No visualized thyroid nodule. No mediastinal adenopathy. Calcified right hilar lymph nodes consistent with prior granulomatous disease. Limited assessment for noncalcified hilar adenopathy in the absence of IV contrast. Lungs/Pleura: Advanced emphysema with apical bulla. Multiple chain sutures in the right lung. Ill-defined patchy and nodular opacities within the right middle lobe. There are small air bronchograms and possible cavitary component, series 8, image 111. Adjacent tree-in-bud opacities.  Ill-defined peribronchovascular opacities are also seen in the anteromedial right middle lobe, series 8, image 116. Areas of mucous plugging in the right lower lobe with mucoid impaction, series 8, image 132. Dependent opacity in the right lower lobe may be postobstructive or infectious. There is a small right pleural effusion. Pleural fluid and opacified bronchi outline blebs in the right lower lobe. There scattered calcified granulomas. Irregular opacity at the right lung apex is stable from prior exam most consistent with scarring. No focal airspace disease in the left lung or left pleural effusion. Upper Abdomen: Distended gallbladder with layering gallstones. Calcified granuloma in the spleen. Musculoskeletal: Posterior disc osteophyte complex at T7-T8 causes mild mass effect on the spinal canal. There are no acute or suspicious osseous abnormalities. IMPRESSION: 1. Ill-defined  patchy and nodular opacities within the right middle lobe with small air bronchograms and possible cavitary component. Adjacent tree-in-bud opacities. Findings most consistent with pneumonia. Recommend imaging follow-up after course of treatment to ensure resolution. This this area is difficult to accurately assess on radiograph, follow-up CT may be warranted. 2. Areas of mucous plugging in the right lower lobe with rather extensive mucoid impaction. Dependent opacity in the right lower lobe may be postobstructive or infectious. Small right pleural effusion. 3. Advanced emphysema with multifocal bulla and blebs. 4. Sequela of prior granulomatous disease with calcified granulomas and calcified right hilar lymph nodes. 5. Distended gallbladder with layering gallstones. Aortic Atherosclerosis (ICD10-I70.0) and Emphysema (ICD10-J43.9). Electronically Signed   By: Keith Rake M.D.   On: 06/17/2020 22:11   DG Chest Port 1 View  Result Date: 06/17/2020 CLINICAL DATA:  Questionable sepsis. EXAM: PORTABLE CHEST 1 VIEW COMPARISON:   Radiograph 05/28/2020. FINDINGS: Advanced emphysema with upper lobe bullous changes. Multiple skin folds project over the right upper hemithorax, 2 skin folds on image labeled 1 and a single skin fold on image labeled 2. The chain sutures noted in the right mid lung. Chronic right midlung scarring. Slight increase in bibasilar lung markings are nonspecific. Unchanged heart size and mediastinal contours. No significant pleural effusion. IMPRESSION: 1. Advanced emphysema with upper lobe bullous changes. 2. Slight increase in bibasilar lung markings, atelectasis versus infection. 3. Multiple skin folds over the right upper hemithorax. Electronically Signed   By: Keith Rake M.D.   On: 06/17/2020 19:53        Scheduled Meds: . aspirin EC  81 mg Oral Daily  . clotrimazole   Topical BID  . diltiazem  180 mg Oral QHS  . enoxaparin (LOVENOX) injection  40 mg Subcutaneous Q24H  . finasteride  5 mg Oral QHS  . ipratropium-albuterol  3 mL Nebulization Q6H  . melatonin  10 mg Oral QHS  . methylPREDNISolone (SOLU-MEDROL) injection  40 mg Intravenous Q12H  . metoCLOPramide  10 mg Oral TID AC  . pantoprazole  40 mg Oral BID AC  . polyethylene glycol  17 g Oral QHS  . rosuvastatin  10 mg Oral QHS  . senna  2 tablet Oral BID  . sildenafil  20 mg Oral BID  . tamsulosin  0.4 mg Oral QPC breakfast   Continuous Infusions: . cefTRIAXone (ROCEPHIN)  IV Stopped (06/18/20 0110)  . doxycycline (VIBRAMYCIN) IV Stopped (06/18/20 0522)     LOS: 0 days    Time spent: 35 minutes    Isidro Monks A Sharnise Blough, MD [facility]   If 7PM-7AM, please contact night-coverage [email or web address]  06/18/2020, 7:43 AM

## 2020-06-18 NOTE — Progress Notes (Signed)
[place] RN note  Patient is under [facility] hospice services with a terminal diagnosis of COPD per Dr. Eulas Post with [facility]. Patient began experiencing increased SOB and elected to call EMS to transport for evaluation in the ED. Pt had on call  hospice RN come out and assess prior to ED admission. He is admitted with Pneumonia. This is a related hospital admission.  Visited with pt at bedside. Pt sitting up in bed alert and oriented. Pt denies new or unmet needs, states breathing is better.  Report exchanged with bedside RN Renee.  Pt continues to use [place] 3L.  V/S: 98.6 oral, 107/66, HR 66, RR 18, SPO2 97% on O2 via [place] at 3 lpm I&O: 240/100 Labs:  Glucose: 164 (H) Calcium: 8.5 (L) Magnesium: 2.6 (H) Albumin: 3.2 (L) Total Protein: 5.5 (L) GFR, Est African American: >60 CRP: 9.6 (H) WBC: 18.2 (H) RBC: 3.58 (L) Hemoglobin: 11.7 (L) HCT: 36.0 (L) MCV: 100.6 (H) Platelets: 137 (L) NEUT#: 17.7 (H) Lymphocyte #: 0.3 (L) Abs Immature Granulocytes: 0.09 (H) Hemoglobin A1C: 5.8 (H)  IVs/PRNs:  solu-medrol 40 mg IV BID, Rocephin 2g IV Q24hr, Doxycycline 100mg  IVPB Q24hr.   Problem List Principal Problem:   Pneumonia of both lower lobes due to infectious organism   My personal interpretation of the chest x-ray is that the patient is developing progressively worsening patchy bilateral lower lobe infiltrates. Due to the ambiguity of these infiltrates will obtain noncontrast CT imaging of the chest to confirm pneumonia.  The patient's presentation of multiple SIRS criteria including both leukocytosis and fevers per EMS will empirically place patient on intravenous antibiotic therapy with ceftriaxone and doxycycline (as opposed to Azithromycin which the patient is chronically on).  Providing patient with gentle intravenous hydration with isotonic fluids  Obtaining blood cultures  Treating current COPD exacerbation with systemic steroids with intravenous Solu-Medrol  every 12 and aggressive bronchodilator therapy.  VBG performed in the emergency department does not reveal any evidence of acute on chronic respiratory failure.  Patient is chronically on 2 to 3 L of oxygen via nasal cannula at home, this will be titrated for bouts of hypoxia.  Active Problems:   COPD exacerbation ([place])   Please see assessment and plan above    Hypertension   Continue home regimen of diltiazem    Paroxysmal atrial fibrillation (HCC)   Continue home regimen of diltiazem  Patient is seemingly mostly rate controlled here in the emergency department  Continue to monitor patient on telemetry  Patient no longer takes Eliquis in the outpatient setting .     OSA on CPAP   Due to concerns for recurrent bilateral lower lobe pneumonia will abstain from CPAP for now. Supplemental oxygen with sleep. Patient can be transitioned back to CPAP nightly as tolerated.    Goals of care, counseling/discussion   Patient is currently enrolled in home hospice however patient confirms that he wishes to be hospitalized for his current condition.  Patient is awake alert and oriented and is able to make his own medical decisions at this time.  I confirmed with the patient that he is DNR at the time of hospitalization    Candidal balanitis   Clinical evidence of candidal balanitis on examination with extension to the scrotum.  Will initiate clotrimazole twice daily for total of 7 days and monitor for symptomatic improvement.    Hyperlipidemia   Continue home regimen of statin therapy    GERD (gastroesophageal reflux disease)   Continue home regimen  of PPI twice daily    Pressure sore on buttocks Patient reports longstanding stage I injury of the bilateral buttocks. I have confirmed this on initial examination and ensure that we provide frequent turning of the patient to minimize pressure injury.  D/C plan - d/c home once medically stable GOC - clear,  DNR, return with hospice Family - updated dtr by phone IDT - updated  Clementeen Hoof, RN, BSN [facility] Liaison [phone] (24h on call)

## 2020-06-18 NOTE — Progress Notes (Signed)
Admitted to 5M15 from ED. Telemetry applied. Oriented to room,bed. No complaints at this time. Siderails up x 3. Call bell placed within reach.

## 2020-06-18 NOTE — Consult Note (Signed)
Consultation Note Date: 06/18/2020   Patient Name: Andrew [name]  DOB: 1940-12-12  MRN: [id number]  Age / Sex: 79 y.o., male  PCP: Lujean Amel, MD Referring Physician: Elmarie Shiley, MD  Reason for Consultation: Establishing goals of care  HPI/Patient Profile: 79 y.o. 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 He presented to [facility] Emergency Department on 06/17/2020 with complaint of shortness of breath that started that morning and rapidly progressed to severe. Per EMS, patient had a temperature of 102 in route. In the ED patient was administered 125 mg of Solu-Medrol as well as bronchodilator treatment with improvement in symptoms. Chest x-ray showed coarse markings at bilateral bases with interpretation of possible atelectasis versus pneumonia. Patient was started on antibiotic therapy and admitted to the Hospitalist service for management of pneumonia.  Palliative Medicine was consulted for goals of care.   Clinical Assessment and Goals of Care: I have reviewed medical records including EPIC notes, labs and imaging, and met at bedside with patient to discuss diagnosis, prognosis, GOC, EOL wishes, disposition, and options.  [name] is known to our service. He was previously seen by Palliative Medicine during his hospitalization 7/11-7/17. He is enrolled in home hospice with [facility].   We discussed a brief life review of the patient. He is originally from [place]. He has been married twice; he has 3 boys from his first marriage and 2 girls from his second marriage. He has 9 grandchildren. He is retired from his career as an Merchandiser, retail. He has a fiancee, Festus Holts, who he met in church.   [name] lives with his daughter. As far as functional status, he is  able to do most things for himself. He is able to mobilize by "holding onto walls and furniture". He reports baseline dyspnea with exertion, which limits activity for him.   We discussed his current illness and what it means in the larger context of his ongoing co-morbidities.  Natural disease trajectory of COPD was discussed. [name] understands and accepts he is at the end stage of his disease trajectory. His goal is to feel as good as he can for as long as he can. We discuss the topic of rehospitalization. [name] shares that he comes to the hospital when his shortness of breath becomes so severe it can not be managed at home. He reports he felt "much better" after receiving treatment with  IV steroids, bronchodilators, and antibiotic therapy in the ED. Discussed that even though he was under hospice care, it was ok to come in for treatment of symptoms that could not be managed at home.   Advanced directives, concepts specific to code status, and artifical feeding and hydration were reviewed and discussed. MOST form on file is from February 2021, and stated "full scope of treatment" in section B, which is not in alignment with his current DNR status. [name] confirms that he would not want CPR or  intubation. We completed a new MOST form.   Questions and concerns were addressed. Patientwas encouraged to call with questions or concerns.   Primary decision maker: Patient   SUMMARY OF RECOMMENDATIONS   - DNR/DNI (already in place) - MOST form completed, scanned into media, and placed on chart  Code Status/Advance Care Planning:  DNR  Scope of treatment (per MOST form completed 06/18/20):   Cardiopulmonary Resuscitation: Do Not Attempt Resuscitation (DNR/No CPR)  Medical Interventions: Limited Additional Interventions: Use medical treatment, IV fluids and cardiac monitoring as indicated. DO NOT USE intubation or mechanical ventilation. May consider use of less invasive airway support such as BiPAP or  CPAP. Also provide comfort measures. Transfer to the hospital if indicated. Avoid intensive care.   Antibiotics: - Antibiotic if indicated  IV Fluids: - IV fluids for a defined trial period  Feeding Tube: - No feeding tube     Symptom Management:  - continue Duonebs every 6 hours - continue ativan 0.5 mg BID prn for anxiety - continue morphine 15 mg every 4 hours prn pain or dyspnea - Bowel regimen: Senna 2 times daily and miralax daily  Palliative Prophylaxis:   Bowel Regimen and Frequent Pain Assessment  Psycho-social/Spiritual:   Desire for further Chaplaincy support:yes  Prognosis:   < 6 months  Discharge Planning: Home with Hospice      Primary Diagnoses: Present on Admission: . COPD exacerbation ([name]) . Pneumonia of both lower lobes due to infectious organism . Pressure sore on buttocks . Paroxysmal atrial fibrillation (H[facility]) . Hypertension . Hyperlipidemia . GERD (gastroesophageal reflux disease) . Candidal balanitis   I have reviewed the medical record, interviewed the patient and family, and examined the patient. The following aspects are pertinent.  Past Medical History:  Diagnosis Date  . Anemia   . BiPAP (biphasic positive airway pressure) dependence    Pt denies history of OSA  . BPH (benign prostatic hyperplasia)   . Cancer ([place])    skin  . COPD (chronic obstructive pulmonary disease) ([place])   . Dysphagia   . Emphysema lung ([place])   . GERD (gastroesophageal reflux disease)    " Silent reflux"  . Hearing loss    right ear  . History of hiatal hernia   . HLD (hyperlipidemia)   . Hypertension   . Oxygen dependent    2-3 liters  . Oxygen dependent   . Pneumonia   . Pulmonary hypertension ([place])   . Sleep apnea    wears cpap  . TIA (transient ischemic attack) 04/27/2020    Family History  Problem Relation Age of Onset  . Cancer Maternal Uncle   . Pancreatitis Mother   . Bone cancer Brother   . Stroke Maternal Grandfather   . Congestive  Heart Failure Maternal Grandfather    Scheduled Meds: . aspirin EC  81 mg Oral Daily  . clotrimazole   Topical BID  . diltiazem  180 mg Oral QHS  . enoxaparin (LOVENOX) injection  40 mg Subcutaneous Q24H  . finasteride  5 mg Oral QHS  . fluticasone  1 spray Each Nare Daily  . guaiFENesin  1,200 mg Oral BID  . ipratropium-albuterol  3 mL Nebulization Q6H  . melatonin  10 mg Oral QHS  . methylPREDNISolone (SOLU-MEDROL) injection  40 mg Intravenous Q12H  . metoCLOPramide  10 mg Oral TID AC  . pantoprazole  40 mg Oral BID AC  . polyethylene glycol  17 g Oral QHS  . rosuvastatin  10 mg Oral  QHS  . senna  2 tablet Oral BID  . sildenafil  20 mg Oral BID  . tamsulosin  0.4 mg Oral QPC breakfast   Continuous Infusions: . ceFEPime (MAXIPIME) IV    . doxycycline (VIBRAMYCIN) IV 100 mg (06/18/20 1031)   PRN Meds:.acetaminophen, ipratropium-albuterol, lactulose, LORazepam, morphine, ondansetron (ZOFRAN) IV  No Known Allergies Review of Systems  Respiratory: Negative for shortness of breath.     Physical Exam Vitals reviewed.  Constitutional:      General: He is not in acute distress.    Comments: Chronically ill-appearing  Pulmonary:     Effort: Pulmonary effort is normal.  Neurological:     Mental Status: He is alert and oriented to person, place, and time.     Vital Signs: BP 107/66 (BP Location: Right Arm)   Pulse 66   Temp 98.6 F (37 C) (Oral)   Resp 18   Ht '5\' 7"'$  (1.702 m)   Wt 59.8 kg   SpO2 97%   BMI 20.65 kg/m  Pain Scale: 0-10   Pain Score: 0-No pain   SpO2: SpO2: 97 % O2 Device:SpO2: 97 % O2 Flow Rate: .O2 Flow Rate (L/min): 4 L/min  IO: Intake/output summary:   Intake/Output Summary (Last 24 hours) at 06/18/2020 1746 Last data filed at 06/18/2020 1500 Gross per 24 hour  Intake 2509.65 ml  Output 300 ml  Net 2209.65 ml    LBM: Last BM Date: 06/17/20 Baseline Weight: Weight: 61.2 kg Most recent weight: Weight: 59.8 kg      Palliative  Assessment/Data: 40-50%    Time In: 1550 Time Out: 1640 Time Total: 50 minutes Greater than 50%  of this time was spent counseling and coordinating care related to the above assessment and plan.  Signed by: Lavena Bullion, NP   Please contact Palliative Medicine Team phone at [phone] for questions and concerns.  For individual provider: See Shea Evans

## 2020-06-18 NOTE — Progress Notes (Signed)
[facility])  Mr. Mahnke is our current hospice patient with [facility] with a terminal diagnosis of COPD. I have reached out to the daughter [name] and have left a message.  Please feel free to contact our team with any hospice related questions.   Clementeen Hoof, RN, [facility] (in [place]) [phone]

## 2020-06-18 NOTE — Progress Notes (Signed)
Pharmacy Antibiotic Note  Andrew [name] is a 79 y.o. male admitted on 06/17/2020 with SOB and PNA. Pt was started on empiric CAP coverage with ceftriaxone and doxycycline, but now to transition ceftriaxone to cefepime with recent hospital admit and concern for HCAP.   Plan: Cefepime 2g IV q12h Follow Cr, LOT, cultures  Height: 5\' 7"  (170.2 cm) Weight: 59.8 kg (131 lb 13.4 oz) IBW/kg (Calculated) : 66.1  Temp (24hrs), Avg:98.6 F (37 C), Min:97.8 F (36.6 C), Max:99.1 F (37.3 C)  Recent Labs  Lab 06/17/20 1924 06/17/20 2100 06/18/20 0417  WBC 15.8*  --  18.2*  CREATININE 0.92  --  0.84  LATICACIDVEN 0.8 0.7  --     Estimated Creatinine Clearance: 59.3 mL/min (by C-G formula based on SCr of 0.84 mg/dL).    No Known Allergies  Antimicrobials this admission: Ceftriaxone 8/1 x1 >>  Doxycycline 8/1 >>  Cefepime 8/1 >>   Microbiology results: 7/31 BCx: NGTD 7/31 UCx: sent  Thank you for allowing pharmacy to be a part of this patient's care.  Arrie Senate, PharmD, BCPS Clinical Pharmacist [phone] Please check AMION for all [place] numbers 06/18/2020

## 2020-06-19 LAB — URINE CULTURE

## 2020-06-19 LAB — CBC
HCT: 32.4 % — ABNORMAL LOW (ref 39.0–52.0)
Hemoglobin: 10.2 g/dL — ABNORMAL LOW (ref 13.0–17.0)
MCH: 32.1 pg (ref 26.0–34.0)
MCHC: 31.5 g/dL (ref 30.0–36.0)
MCV: 101.9 fL — ABNORMAL HIGH (ref 80.0–100.0)
Platelets: 135 10*3/uL — ABNORMAL LOW (ref 150–400)
RBC: 3.18 MIL/uL — ABNORMAL LOW (ref 4.22–5.81)
RDW: 12.5 % (ref 11.5–15.5)
WBC: 13.1 10*3/uL — ABNORMAL HIGH (ref 4.0–10.5)
nRBC: 0 % (ref 0.0–0.2)

## 2020-06-19 LAB — EXPECTORATED SPUTUM ASSESSMENT W GRAM STAIN, RFLX TO RESP C

## 2020-06-19 MED ORDER — PREDNISONE 20 MG PO TABS
30.0000 mg | ORAL_TABLET | Freq: Every day | ORAL | Status: DC
  Administered 2020-06-20: 30 mg via ORAL
  Filled 2020-06-19: qty 1

## 2020-06-19 MED ORDER — ENSURE ENLIVE PO LIQD: 237.0000 mL | Freq: Two times a day (BID) | ORAL | Status: DC

## 2020-06-19 MED ORDER — ADULT MULTIVITAMIN W/MINERALS CH
1.0000 | ORAL_TABLET | Freq: Every day | ORAL | Status: DC
  Administered 2020-06-19 – 2020-06-20 (×2): 1 via ORAL
  Filled 2020-06-19 (×2): qty 1

## 2020-06-19 NOTE — Progress Notes (Signed)
Initial Nutrition Assessment  DOCUMENTATION CODES:   Not applicable  INTERVENTION:   -Ensure Enlive po BID, each supplement provides 350 kcal and 20 grams of protein -MVI with minerals daily  NUTRITION DIAGNOSIS:   Increased nutrient needs related to chronic illness (COPD) as evidenced by estimated needs.  GOAL:   Patient will meet greater than or equal to 90% of their needs  MONITOR:   PO intake, Supplement acceptance, Labs, Weight trends, Skin, I & O's  REASON FOR ASSESSMENT:   Consult Assessment of nutrition requirement/status  ASSESSMENT:   79 year old male with past medical history of advanced COPD on home hospice with chronic respiratory failure (on 2-3LPM via  at home), paroxysmal atrial fibrillation, obstructive sleep apnea gastroesophageal reflux disease who presents to [facility] emergency department with complaints of shortness of breath.  Pt admitted with pneumonia of both lower lobes due to infectious organism.   Reviewed I/O's: +1.3 L x 24 hours and +2.8 L since admission  UOP: 400 ml x 24 hours  Pt sleeping soundly at time of visit; he did not respond to name being called.   Pt with good appetite; noted meal completion 100%.  Reviewed wt hx; pt has experienced a 6.7% wt loss over the past 6 months, which is not significant for time frame. Of note, pt was receiving hospice services at home and plans to resume them at discharge (followed for terminal COPD). Pt with increased nutritional needs due to COPD and would benefit from addition of oral nutrition supplements.   Medications reviewed and include cardizem, IV solu-medrol, miralax, and senna.  Labs reviewed: Mg: 2.6.   Diet Order:   Diet Order            Diet Heart Room service appropriate? Yes; Fluid consistency: Thin  Diet effective now                 EDUCATION NEEDS:   No education needs have been identified at this time  Skin:  Skin Assessment: Skin Integrity Issues: Skin  Integrity Issues:: Stage I Stage I: sacrum, lt buttocks  Last BM:  06/17/20  Height:   Ht Readings from Last 1 Encounters:  06/17/20 5\' 7"  (1.702 m)    Weight:   Wt Readings from Last 1 Encounters:  06/18/20 61.1 kg    Ideal Body Weight:  67.3 kg  BMI:  Body mass index is 21.1 kg/m.  Estimated Nutritional Needs:   Kcal:  1950-2150  Protein:  95-110 grams  Fluid:  > 1.9 L    Loistine Chance, RD, LDN, [place] Registered Dietitian II Certified Diabetes Care and Education Specialist Please refer to [facility] for RD and/or RD on-call/weekend/after hours pager

## 2020-06-19 NOTE — Progress Notes (Signed)
[facility] hospitalized hospice patient RN note.  Patient is under [facility] hospice services with a terminal diagnosis of COPD per Dr. Eulas Post with [facility]. Patient began experiencing increased SOB and elected to call EMS to transport for evaluation in the ED. Pt had on call  hospice RN come out and assess prior to ED admission. He is admitted with Pneumonia. This is a related hospital admission.  Visited with patient at bedside. Has sitting up on side of bed. Was able to go to bathroom unassisted. He states he feels much better today and anticipates going home tomorrow pending some lab work. There was not family present, daughter had left shortly before liaison visit.   VS: 97.9, 106/45, 63, 20, 97% 3Lnc I/O: 1680/400 Labs: WBC 13.1. RBC 3.18, Hgb 10.2, HCT 32.4, MCV 101.9, Platelets 135, Sputum cx pending.   IV/PRNs: Maxipime 2gm IVPB BID, Vibromycin 100mg  IVPB BID  Problem list: 1-Pneumonia bilateral lower lobe: Continue with IV antibiotics. CT chest: Nodular opacity within the right middle lobe with a small air bronchograms and possible cavitary component.  Adjacent tree-in-bud opacity.  Consistent with pneumonia.  Need follow-up CT chest mucous plugging in the right lower lobe.  Dependent opacity in the right lower lobe may be post obstructive or infectious.  Advanced emphysema with multifocal poorly bleb Continue with  Doxycycline.  At risk for Pseudomonas change ceftriaxone to cefepime Blood cultures; No growth to date.  Postnasal drip: Started  Flonase Sputum culture growing gram positive cocci in pairs, cluster. On doxy WBC trending down.   COPD exacerbation:  Treated with  low-dose Solu-Medrol--Transition today to prednisone.  Continue with nebulizer. Continue with Guaifenesin  Chronic hypoxic respiratory failure: Continue with oxygen supplementation  Hypertension: Continue with diltiazem  Candida Balanitis: Continue with clotrimazole twice daily.  GERD:  Continue with PPI. Pressure injury sacrum right and left a stage I: Present on admission, continue with local care.  D/C plan - d/c home once medically stable GOC - clear, DNR, return with hospice Family - updated dtr by phone IDT - updated  Farrel Gordon, RN [facility] Liaison (listed on [place])  [phone]

## 2020-06-19 NOTE — Telephone Encounter (Signed)
Called and spoke with Almyra Free to let her know that Dr. Valeta Harms said it was fine for patient to do neb treatments back to back. She expressed understanding.  Did want to let Dr. Valeta Harms know that patient is currently admitted into the hospital for Pneumonia.

## 2020-06-19 NOTE — Plan of Care (Signed)
States feels much better today. "Breathing easier." Assisting with ADL's. No complaints or needs.

## 2020-06-19 NOTE — Progress Notes (Addendum)
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 fib, obstructive sleep apnea, GERD who presented to [facility] on complaining of shortness of breath.  Patient recently hospitalized on 7/11 until 7/17, discharged on prednisone taper. He related that he wake up with worsening shortness of breath, cough.  He also reports generalized weakness.  Generalized abdominal pain.  Denies chest pain. Evaluation in the ED patient was found to be febrile temperature 102, he received IV Solu-Medrol, nebulizer and IV antibiotics.  Chest x-ray revealed coarse marking at the bilateral bases concerning with possible atelectasis versus pneumonia.     Assessment & Plan:   Principal Problem:   Pneumonia of both lower lobes due to infectious organism Active Problems:   Hypertension   Hyperlipidemia   GERD (gastroesophageal reflux disease)   Pressure sore on buttocks   Paroxysmal atrial fibrillation (HCC)   OSA on CPAP   COPD exacerbation (HCC)   Goals of care, counseling/discussion   Candidal balanitis  1-Pneumonia bilateral lower lobe: Continue with IV antibiotics. CT chest: Nodular opacity within the right middle lobe with a small air bronchograms and possible cavitary component.  Adjacent tree-in-bud opacity.  Consistent with pneumonia.  Need follow-up CT chest mucous plugging in the right lower lobe.  Dependent opacity in the right lower lobe may be post obstructive or infectious.  Advanced emphysema with multifocal poorly bleb Continue with  Doxycycline.  At risk for Pseudomonas change ceftriaxone to cefepime Blood cultures; No growth to date.  Postnasal drip: Started  Flonase Sputum culture growing gram positive cocci in pairs, cluster. On doxy WBC  trending down.   COPD exacerbation:  Treated with  low-dose Solu-Medrol--Transition today to prednisone.  Continue with nebulizer. Continue with Guaifenesin  Chronic hypoxic respiratory failure: Continue with oxygen supplementation  Hypertension: Continue with diltiazem  Candida Balanitis; : Continue with clotrimazole twice daily.  HLD;  Continue with statins.  GERD: Continue with PPI. Pressure injury sacrum right and left a stage I: Present on admission, continue with local care.  Pressure Injury 06/17/20 Sacrum Right;Left Stage 1 -  Intact skin with non-blanchable redness of a localized area usually over a bony prominence. (Active)  06/17/20 2230  Location: Sacrum  Location Orientation: Right;Left  Staging: Stage 1 -  Intact skin with non-blanchable redness of a localized area usually over a bony prominence.  Wound Description (Comments):   Present on Admission: Yes          Estimated body mass index is 21.1 kg/m as calculated from the following:   Height as of this encounter: 5\' 7"  (1.702 m).   Weight as of this encounter: 61.1 kg.   DVT prophylaxis: Lovenox Code Status: DNR Family Communication: Discussed with patient Disposition Plan:  Status is: Inpatient  Dispo: The patient is from: Home              Anticipated d/c is to: Home              Anticipated d/c date is: 2 days              Patient currently is not medically stable to d/c.  Home in 24 hours if a sputum culture results are available.        Consultants:  Palliative Procedures:  None  Antimicrobials:  Ceftriaxone and doxycycline  Subjective: He is breathing better, cough improving. No new complaints.   Objective: Vitals:   06/18/20 2124 06/19/20 0628 06/19/20 0923 06/19/20 0957  BP: (!) 117/48 (!) 104/60  (!) 106/45  Pulse: 73 63  66  Resp: 18 18  20   Temp: 98.2 F (36.8 C) 97.8 F (36.6 C)  97.9 F (36.6 C)  TempSrc: Oral Oral  Oral  SpO2: 99% 100% 94% 97%  Weight:  61.1 kg     Height:        Intake/Output Summary (Last 24 hours) at 06/19/2020 1324 Last data filed at 06/19/2020 1301 Gross per 24 hour  Intake 2070.02 ml  Output 600 ml  Net 1470.02 ml   Filed Weights   06/17/20 1958 06/17/20 2229 06/18/20 2124  Weight: 61.2 kg 59.8 kg 61.1 kg    Examination:  General exam: NAD Respiratory system: No wheezing normal respiratory effort Cardiovascular system: S1-S2 regular rhythm or rate Gastrointestinal system: Bowel sounds present, soft nontender not distended Central nervous system: Alert and oriented, following commands Extremities: Symmetric power  Data Reviewed: I have personally reviewed following labs and imaging studies  CBC: Recent Labs  Lab 06/17/20 1924 06/17/20 1942 06/18/20 0417 06/19/20 0750  WBC 15.8*  --  18.2* 13.1*  NEUTROABS 14.7*  --  17.7*  --   HGB 12.3* 12.6* 11.7* 10.2*  HCT 38.3* 37.0* 36.0* 32.4*  MCV 101.9*  --  100.6* 101.9*  PLT 145*  --  137* 892*   Basic Metabolic Panel: Recent Labs  Lab 06/17/20 1924 06/17/20 1942 06/18/20 0417  NA 140 139 139  K 3.9 3.8 4.4  CL 101  --  103  CO2 30  --  27  GLUCOSE 147*  --  164*  BUN 17  --  17  CREATININE 0.92  --  0.84  CALCIUM 8.7*  --  8.5*  MG  --   --  2.6*   GFR: Estimated Creatinine Clearance: 60.6 mL/min (by C-G formula based on SCr of 0.84 mg/dL). Liver Function Tests: Recent Labs  Lab 06/17/20 1924 06/18/20 0417  AST 14* 16  ALT 22 20  ALKPHOS 51 48  BILITOT 0.6 0.7  PROT 5.6* 5.5*  ALBUMIN 3.4* 3.2*   No results for input(s): LIPASE, AMYLASE in the last 168 hours. No results for input(s): AMMONIA in the last 168 hours. Coagulation Profile: Recent Labs  Lab 06/17/20 1924  INR 1.2   Cardiac Enzymes: No results for input(s): CKTOTAL, CKMB, CKMBINDEX, TROPONINI in the last 168 hours. BNP (last 3 results) No results for input(s): PROBNP in the last 8760 hours. HbA1C: Recent Labs    06/18/20 0417  HGBA1C 5.8*   CBG: No  results for input(s): GLUCAP in the last 168 hours. Lipid Profile: No results for input(s): CHOL, HDL, LDLCALC, TRIG, CHOLHDL, LDLDIRECT in the last 72 hours. Thyroid Function Tests: No results for input(s): TSH, T4TOTAL, FREET4, T3FREE, THYROIDAB in the last 72 hours. Anemia Panel: No results for input(s): VITAMINB12, FOLATE, FERRITIN, TIBC, IRON, RETICCTPCT in the last 72 hours. Sepsis Labs: Recent Labs  Lab 06/17/20 1924 06/17/20 2100 06/18/20 0417  PROCALCITON  --   --  0.23  LATICACIDVEN 0.8 0.7  --     Recent Results (from the past 240 hour(s))  Urine culture     Status: Abnormal   Collection Time: 06/17/20  7:10 PM   Specimen: In/Out Cath Urine  Result Value Ref Range Status   Specimen  Description IN/OUT CATH URINE  Final   Special Requests   Final    NONE Performed at [facility], [place] [address] [place], [place] [address]    Culture MULTIPLE SPECIES PRESENT, SUGGEST RECOLLECTION (A)  Final   Report Status 06/19/2020 FINAL  Final  Blood culture (routine single)     Status: None (Preliminary result)   Collection Time: 06/17/20  7:29 PM   Specimen: BLOOD RIGHT HAND  Result Value Ref Range Status   Specimen Description BLOOD RIGHT HAND  Final   Special Requests   Final    BOTTLES DRAWN AEROBIC AND ANAEROBIC Blood Culture adequate volume   Culture   Final    NO GROWTH 2 DAYS Performed at [facility], [place] [address], [place], [place] [address]    Report Status PENDING  Incomplete  SARS Coronavirus 2 by RT PCR (hospital order, performed in [facility] hospital lab) Nasopharyngeal Nasopharyngeal Swab     Status: None   Collection Time: 06/17/20  7:52 PM   Specimen: Nasopharyngeal Swab  Result Value Ref Range Status   SARS Coronavirus 2 NEGATIVE NEGATIVE Final    Comment: (NOTE) SARS-CoV-2 target nucleic acids are NOT DETECTED.  The SARS-CoV-2 RNA is generally detectable in upper and lower respiratory specimens during the acute phase of infection. The  lowest concentration of SARS-CoV-2 viral copies this assay can detect is 250 copies / mL. A negative result does not preclude SARS-CoV-2 infection and should not be used as the sole basis for treatment or other patient management decisions.  A negative result may occur with improper specimen collection / handling, submission of specimen other than nasopharyngeal swab, presence of viral mutation(s) within the areas targeted by this assay, and inadequate number of viral copies (<250 copies / mL). A negative result must be combined with clinical observations, patient history, and epidemiological information.  Fact Sheet for Patients:   StrictlyIdeas.no  Fact Sheet for Healthcare Providers: BankingDealers.co.za  This test is not yet approved or  cleared by the [place] FDA and has been authorized for detection and/or diagnosis of SARS-CoV-2 by FDA under an Emergency Use Authorization (EUA).  This EUA will remain in effect (meaning this test can be used) for the duration of the COVID-19 declaration under Section 564(b)(1) of the Act, 21 U.S.C. section 360bbb-3(b)(1), unless the authorization is terminated or revoked sooner.  Performed at [facility], [place] [address], [place], [place] [address]   Sputum culture     Status: None   Collection Time: 06/18/20  5:59 PM   Specimen: Expectorated Sputum  Result Value Ref Range Status   Specimen Description EXPECTORATED SPUTUM  Final   Special Requests NONE  Final   Sputum evaluation   Final    THIS SPECIMEN IS ACCEPTABLE FOR SPUTUM CULTURE Performed at [facility], [place] [address] [place], [place] [address]    Report Status 06/19/2020 FINAL  Final  Culture, respiratory     Status: None (Preliminary result)   Collection Time: 06/18/20  5:59 PM  Result Value Ref Range Status   Specimen Description EXPECTORATED SPUTUM  Final   Special Requests NONE Reflexed from [id number]  Final     Gram Stain   Final    NO WBC SEEN RARE GRAM POSITIVE COCCI IN PAIRS IN CLUSTERS Performed at [facility], [place] [address], [place], [place] [address]    Culture PENDING  Incomplete   Report Status PENDING  Incomplete  Radiology Studies: CT CHEST WO CONTRAST  Result Date: 06/17/2020 CLINICAL DATA:  Multiple sirs criteria without evidence of pneumonia on x-ray. Evaluate for pneumonia. EXAM: CT CHEST WITHOUT CONTRAST TECHNIQUE: Multidetector CT imaging of the chest was performed following the standard protocol without IV contrast. COMPARISON:  Radiograph earlier today.  CT 12/14/2018 FINDINGS: Cardiovascular: Aortic atherosclerosis. No aortic aneurysm. The heart is normal in size. Coronary artery calcifications. No pericardial effusion. Mediastinum/Nodes: Patulous esophagus without wall thickening. No visualized thyroid nodule. No mediastinal adenopathy. Calcified right hilar lymph nodes consistent with prior granulomatous disease. Limited assessment for noncalcified hilar adenopathy in the absence of IV contrast. Lungs/Pleura: Advanced emphysema with apical bulla. Multiple chain sutures in the right lung. Ill-defined patchy and nodular opacities within the right middle lobe. There are small air bronchograms and possible cavitary component, series 8, image 111. Adjacent tree-in-bud opacities. Ill-defined peribronchovascular opacities are also seen in the anteromedial right middle lobe, series 8, image 116. Areas of mucous plugging in the right lower lobe with mucoid impaction, series 8, image 132. Dependent opacity in the right lower lobe may be postobstructive or infectious. There is a small right pleural effusion. Pleural fluid and opacified bronchi outline blebs in the right lower lobe. There scattered calcified granulomas. Irregular opacity at the right lung apex is stable from prior exam most consistent with scarring. No focal airspace disease in the left lung or left pleural  effusion. Upper Abdomen: Distended gallbladder with layering gallstones. Calcified granuloma in the spleen. Musculoskeletal: Posterior disc osteophyte complex at T7-T8 causes mild mass effect on the spinal canal. There are no acute or suspicious osseous abnormalities. IMPRESSION: 1. Ill-defined patchy and nodular opacities within the right middle lobe with small air bronchograms and possible cavitary component. Adjacent tree-in-bud opacities. Findings most consistent with pneumonia. Recommend imaging follow-up after course of treatment to ensure resolution. This this area is difficult to accurately assess on radiograph, follow-up CT may be warranted. 2. Areas of mucous plugging in the right lower lobe with rather extensive mucoid impaction. Dependent opacity in the right lower lobe may be postobstructive or infectious. Small right pleural effusion. 3. Advanced emphysema with multifocal bulla and blebs. 4. Sequela of prior granulomatous disease with calcified granulomas and calcified right hilar lymph nodes. 5. Distended gallbladder with layering gallstones. Aortic Atherosclerosis (ICD10-I70.0) and Emphysema (ICD10-J43.9). Electronically Signed   By: Keith Rake M.D.   On: 06/17/2020 22:11   DG Chest Port 1 View  Result Date: 06/17/2020 CLINICAL DATA:  Questionable sepsis. EXAM: PORTABLE CHEST 1 VIEW COMPARISON:  Radiograph 05/28/2020. FINDINGS: Advanced emphysema with upper lobe bullous changes. Multiple skin folds project over the right upper hemithorax, 2 skin folds on image labeled 1 and a single skin fold on image labeled 2. The chain sutures noted in the right mid lung. Chronic right midlung scarring. Slight increase in bibasilar lung markings are nonspecific. Unchanged heart size and mediastinal contours. No significant pleural effusion. IMPRESSION: 1. Advanced emphysema with upper lobe bullous changes. 2. Slight increase in bibasilar lung markings, atelectasis versus infection. 3. Multiple skin folds  over the right upper hemithorax. Electronically Signed   By: Keith Rake M.D.   On: 06/17/2020 19:53        Scheduled Meds: . aspirin EC  81 mg Oral Daily  . clotrimazole   Topical BID  . diltiazem  180 mg Oral QHS  . enoxaparin (LOVENOX) injection  40 mg Subcutaneous Q24H  . finasteride  5 mg Oral QHS  . fluticasone  1 spray Each  Nare Daily  . guaiFENesin  1,200 mg Oral BID  . ipratropium-albuterol  3 mL Nebulization Q6H  . melatonin  10 mg Oral QHS  . methylPREDNISolone (SOLU-MEDROL) injection  40 mg Intravenous Q12H  . metoCLOPramide  10 mg Oral TID AC  . pantoprazole  40 mg Oral BID AC  . polyethylene glycol  17 g Oral QHS  . rosuvastatin  10 mg Oral QHS  . senna  2 tablet Oral BID  . sildenafil  20 mg Oral BID  . tamsulosin  0.4 mg Oral QPC breakfast   Continuous Infusions: . ceFEPime (MAXIPIME) IV 2 g (06/19/20 1007)  . doxycycline (VIBRAMYCIN) IV 100 mg (06/19/20 1051)     LOS: 1 day    Time spent: 35 minutes    Naavya Postma A Eleah Lahaie, MD [facility]   If 7PM-7AM, please contact night-coverage [email or web address]  06/19/2020, 1:24 PM

## 2020-06-20 MED ORDER — CEFUROXIME AXETIL 250 MG PO TABS: 250.0000 mg | ORAL_TABLET | Freq: Two times a day (BID) | ORAL | 0 refills | Status: AC

## 2020-06-20 MED ORDER — PREDNISONE 10 MG PO TABS: ORAL_TABLET | ORAL | 0 refills | Status: DC

## 2020-06-20 MED ORDER — DOXYCYCLINE HYCLATE 50 MG PO CAPS
100.0000 mg | ORAL_CAPSULE | Freq: Two times a day (BID) | ORAL | 0 refills | Status: AC
Start: 2020-06-20 — End: 2020-06-23

## 2020-06-20 MED ORDER — IPRATROPIUM-ALBUTEROL 0.5-2.5 (3) MG/3ML IN SOLN
3.0000 mL | Freq: Three times a day (TID) | RESPIRATORY_TRACT | Status: DC
  Administered 2020-06-20: 3 mL via RESPIRATORY_TRACT
  Filled 2020-06-20: qty 3

## 2020-06-20 NOTE — Progress Notes (Signed)
DISCHARGE NOTE HOME Supreme Rybarczyk to be discharged Home per MD order. Discussed prescriptions and follow up appointments with the patient. Prescriptions given to patient; medication list explained in detail. Patient verbalized understanding.  Skin clean, dry and intact without evidence of skin break down, no evidence of skin tears noted. IV catheter discontinued intact. Site without signs and symptoms of complications. Dressing and pressure applied. Pt denies pain at the site currently. No complaints noted.  Patient free of lines, drains, and wounds.   An After Visit Summary (AVS) was printed and given to the patient. Patient escorted via wheelchair, and discharged home via private auto.  Aneta Mins BSN, RN3

## 2020-06-20 NOTE — Discharge Summary (Signed)
Physician Discharge Summary  Andrew [name] XIP:[id number] DOB: 1941-02-09 DOA: 06/17/2020  PCP: Lujean Amel, MD  Admit date: 06/17/2020 Discharge date: 06/20/2020  Admitted From: Home  Disposition:  Home with Hospice.   Recommendations for Outpatient Follow-up:  1. Follow up with PCP in 1-2 weeks 2. Please obtain BMP/CBC in one week 3. Home with hospice.  4. Follow up with pulmonologist PRN.  5. Follow final sputum culture results.     Discharge Condition: Stable.  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 fib, obstructive sleep apnea, GERD who presented to [facility] on complaining of shortness of breath.  Patient recently hospitalized on 7/11 until 7/17, discharged on prednisone taper. He related that he wake up with worsening shortness of breath, cough.  He also reports generalized weakness.  Generalized abdominal pain.  Denies chest pain. Evaluation in the ED patient was found to be febrile temperature 102, he received IV Solu-Medrol, nebulizer and IV antibiotics.  Chest x-ray revealed coarse marking at the bilateral bases concerning with possible atelectasis versus pneumonia.   1-Pneumonia bilateral lower lobe: Treated  with IV antibiotics. Cefepime and doxy.  CT chest: Nodular opacity within the right middle lobe with a small air bronchograms and possible cavitary component.  Adjacent tree-in-bud opacity.  Consistent with pneumonia.  Need follow-up CT chest mucous plugging in the right lower lobe.  Dependent opacity in the right lower lobe may be post obstructive or infectious.  Advanced emphysema with multifocal poorly bleb Blood cultures; No growth to date.  Postnasal drip: Started  Flonase Sputum culture growing gram positive cocci in pairs, cluster. On doxy. Please  follow sputum culture.  WBC trending down. stable for discharge. He will be discharge on Doxy and ceftin for 3 days.   COPD exacerbation:  Treated with  low-dose Solu-Medrol--Transition today to prednisone. taper for 3 days.  Continue with nebulizer. Continue with Guaifenesin  Chronic hypoxic respiratory failure: Continue with oxygen supplementation  Hypertension: Continue with diltiazem  Candida Balanitis; : Continue with clotrimazole twice daily.  HLD;  Continue with statins.  GERD: Continue with PPI. Pressure injury sacrum right and left a stage I: Present on admission, continue with local care.  Pressure Injury 06/17/20 Sacrum Right;Left Stage 1 -  Intact skin with non-blanchable redness of a localized area usually over a bony prominence. (Active)  06/17/20 2230  Location: Sacrum  Location Orientation: Right;Left  Staging: Stage 1 -  Intact skin with non-blanchable redness of a localized area usually over a bony prominence.  Wound Description (Comments):   Present on Admission: Yes        Discharge Diagnoses:  Principal Problem:   Pneumonia of both lower lobes due to infectious organism Active Problems:   Hypertension   Hyperlipidemia   GERD (gastroesophageal reflux disease)   Pressure sore on buttocks   Paroxysmal atrial fibrillation (HCC)   OSA on CPAP   COPD exacerbation (HCC)   Goals of care, counseling/discussion   Candidal balanitis    Discharge Instructions  Discharge Instructions    Diet - low sodium heart healthy   Complete by: As directed    Discharge wound care:   Complete by: As directed    Frequent change in position. Barrier cream   Increase activity slowly   Complete by: As directed      Allergies as  of 06/20/2020   No Known Allergies     Medication List    STOP taking these medications   Eliquis 5 MG Tabs tablet Generic drug: apixaban     TAKE these medications   AeroChamber MV inhaler Use as instructed    albuterol 108 (90 Base) MCG/ACT inhaler Commonly known as: VENTOLIN HFA Inhale 2 puffs into the lungs every 6 (six) hours as needed for wheezing or shortness of breath.   aspirin EC 81 MG tablet Take 81 mg by mouth daily.   azithromycin 250 MG tablet Commonly known as: ZITHROMAX Take 250 mg by mouth every Monday, Wednesday, and Friday.   cefUROXime 250 MG tablet Commonly known as: CEFTIN Take 1 tablet (250 mg total) by mouth 2 (two) times daily with a meal for 3 days.   diltiazem 180 MG 24 hr capsule Commonly known as: CARDIZEM CD TAKE 1 CAPSULE EVERY DAY (NEEDS OFFICE VISIT FOR FURTHER REFILLS) What changed: See the new instructions.   doxycycline 50 MG capsule Commonly known as: VIBRAMYCIN Take 2 capsules (100 mg total) by mouth 2 (two) times daily for 3 days.   finasteride 5 MG tablet Commonly known as: PROSCAR Take 5 mg by mouth 2 (two) times daily.   furosemide 40 MG tablet Commonly known as: LASIX Take 40 mg by mouth every Monday, Wednesday, and Friday.   guaiFENesin 600 MG 12 hr tablet Commonly known as: MUCINEX Take 1 tablet (600 mg total) by mouth 2 (two) times daily.   ipratropium-albuterol 0.5-2.5 (3) MG/3ML Soln Commonly known as: DUONEB Take 3 mLs by nebulization every 6 (six) hours as needed. What changed: when to take this   levalbuterol 0.63 MG/3ML nebulizer solution Commonly known as: Xopenex Take 3 mLs (0.63 mg total) by nebulization every 4 (four) hours as needed for wheezing or shortness of breath. What changed: when to take this   LORazepam 0.5 MG tablet Commonly known as: ATIVAN Take 0.5 mg by mouth every 4 (four) hours as needed for anxiety.   Melatonin 10 MG Caps Take 1 capsule by mouth at bedtime.   metoCLOPramide 10 MG tablet Commonly known as: REGLAN Take 10 mg by mouth 3 (three) times daily before meals.   morphine 15 MG tablet Commonly known as: MSIR Take 15 mg by mouth every 4 (four) hours as needed (pain).   omeprazole 40  MG capsule Commonly known as: PRILOSEC Take 40 mg by mouth 2 (two) times daily.   OVER THE COUNTER MEDICATION Take 1 capsule by mouth at bedtime. CBD oil   OXYGEN Inhale 3 L into the lungs continuous.   polyethylene glycol 17 g packet Commonly known as: MIRALAX / GLYCOLAX Take 17 g by mouth daily. What changed:   when to take this  additional instructions   Potassium Chloride ER 20 MEQ Tbcr Take 20 mEq by mouth daily. What changed: when to take this   predniSONE 10 MG tablet Commonly known as: DELTASONE Take 3 tablets for 3 days then resume home dose of 10 mg. What changed:   how much to take  how to take this  when to take this  additional instructions  Another medication with the same name was removed. Continue taking this medication, and follow the directions you see here.   prochlorperazine 10 MG tablet Commonly known as: COMPAZINE Take 10 mg by mouth every 4 (four) hours as needed for nausea or vomiting.   rosuvastatin 10 MG tablet Commonly known as: CRESTOR Take 10 mg by mouth at  bedtime.   senna 8.6 MG Tabs tablet Commonly known as: SENOKOT Take 2 tablets by mouth 2 (two) times daily.   sildenafil 20 MG tablet Commonly known as: REVATIO Take 1 tablet (20 mg total) by mouth 2 (two) times daily.   tamsulosin 0.4 MG Caps capsule Commonly known as: FLOMAX Take 1 capsule (0.4 mg total) by mouth daily after breakfast.   temazepam 30 MG capsule Commonly known as: RESTORIL Take 1 capsule (30 mg total) by mouth at bedtime.   vitamin C 1000 MG tablet Take 1,000 mg by mouth at bedtime.   Vitamin D 50 MCG (2000 UT) tablet Take 2,000 Units by mouth at bedtime.            Discharge Care Instructions  (From admission, onward)         Start     Ordered   06/20/20 0000  Discharge wound care:       Comments: Frequent change in position. Barrier cream   06/20/20 0949          No Known  Allergies  Consultations: palliative  Procedures/Studies: CT CHEST WO CONTRAST  Result Date: 06/17/2020 CLINICAL DATA:  Multiple sirs criteria without evidence of pneumonia on x-ray. Evaluate for pneumonia. EXAM: CT CHEST WITHOUT CONTRAST TECHNIQUE: Multidetector CT imaging of the chest was performed following the standard protocol without IV contrast. COMPARISON:  Radiograph earlier today.  CT 12/14/2018 FINDINGS: Cardiovascular: Aortic atherosclerosis. No aortic aneurysm. The heart is normal in size. Coronary artery calcifications. No pericardial effusion. Mediastinum/Nodes: Patulous esophagus without wall thickening. No visualized thyroid nodule. No mediastinal adenopathy. Calcified right hilar lymph nodes consistent with prior granulomatous disease. Limited assessment for noncalcified hilar adenopathy in the absence of IV contrast. Lungs/Pleura: Advanced emphysema with apical bulla. Multiple chain sutures in the right lung. Ill-defined patchy and nodular opacities within the right middle lobe. There are small air bronchograms and possible cavitary component, series 8, image 111. Adjacent tree-in-bud opacities. Ill-defined peribronchovascular opacities are also seen in the anteromedial right middle lobe, series 8, image 116. Areas of mucous plugging in the right lower lobe with mucoid impaction, series 8, image 132. Dependent opacity in the right lower lobe may be postobstructive or infectious. There is a small right pleural effusion. Pleural fluid and opacified bronchi outline blebs in the right lower lobe. There scattered calcified granulomas. Irregular opacity at the right lung apex is stable from prior exam most consistent with scarring. No focal airspace disease in the left lung or left pleural effusion. Upper Abdomen: Distended gallbladder with layering gallstones. Calcified granuloma in the spleen. Musculoskeletal: Posterior disc osteophyte complex at T7-T8 causes mild mass effect on the spinal  canal. There are no acute or suspicious osseous abnormalities. IMPRESSION: 1. Ill-defined patchy and nodular opacities within the right middle lobe with small air bronchograms and possible cavitary component. Adjacent tree-in-bud opacities. Findings most consistent with pneumonia. Recommend imaging follow-up after course of treatment to ensure resolution. This this area is difficult to accurately assess on radiograph, follow-up CT may be warranted. 2. Areas of mucous plugging in the right lower lobe with rather extensive mucoid impaction. Dependent opacity in the right lower lobe may be postobstructive or infectious. Small right pleural effusion. 3. Advanced emphysema with multifocal bulla and blebs. 4. Sequela of prior granulomatous disease with calcified granulomas and calcified right hilar lymph nodes. 5. Distended gallbladder with layering gallstones. Aortic Atherosclerosis (ICD10-I70.0) and Emphysema (ICD10-J43.9). Electronically Signed   By: Keith Rake M.D.   On: 06/17/2020 22:11  DG Chest Port 1 View  Result Date: 06/17/2020 CLINICAL DATA:  Questionable sepsis. EXAM: PORTABLE CHEST 1 VIEW COMPARISON:  Radiograph 05/28/2020. FINDINGS: Advanced emphysema with upper lobe bullous changes. Multiple skin folds project over the right upper hemithorax, 2 skin folds on image labeled 1 and a single skin fold on image labeled 2. The chain sutures noted in the right mid lung. Chronic right midlung scarring. Slight increase in bibasilar lung markings are nonspecific. Unchanged heart size and mediastinal contours. No significant pleural effusion. IMPRESSION: 1. Advanced emphysema with upper lobe bullous changes. 2. Slight increase in bibasilar lung markings, atelectasis versus infection. 3. Multiple skin folds over the right upper hemithorax. Electronically Signed   By: Keith Rake M.D.   On: 06/17/2020 19:53   DG Chest Port 1 View  Result Date: 05/28/2020 CLINICAL DATA:  shob EXAM: PORTABLE CHEST - 1  VIEW COMPARISON:  11/18/2019 FINDINGS: Attenuated bronchovascular markings in the upper lobes. Postop changes in the right mid lung and hilum. Some bronchovascular crowding in the lung bases. No focal infiltrate or overt edema. Heart size and mediastinal contours are within normal limits. No effusion.  No definite pneumothorax. Visualized bones unremarkable. IMPRESSION: COPD.  No acute cardiopulmonary disease. Electronically Signed   By: Lucrezia Europe M.D.   On: 05/28/2020 14:08      Subjective: He is breathing ok, feeling ok. Feels he can go home  Discharge Exam: Vitals:   06/20/20 0450 06/20/20 0828  BP: (!) 121/55   Pulse: 63   Resp: 17 18  Temp: 98.6 F (37 C)   SpO2: 100% 98%     General: Pt is alert, awake, not in acute distress Cardiovascular: RRR, S1/S2 +, no rubs, no gallops Respiratory: CTA bilaterally, no wheezing, no rhonchi Abdominal: Soft, NT, ND, bowel sounds + Extremities: no edema, no cyanosis    The results of significant diagnostics from this hospitalization (including imaging, microbiology, ancillary and laboratory) are listed below for reference.     Microbiology: Recent Results (from the past 240 hour(s))  Urine culture     Status: Abnormal   Collection Time: 06/17/20  7:10 PM   Specimen: In/Out Cath Urine  Result Value Ref Range Status   Specimen Description IN/OUT CATH URINE  Final   Special Requests   Final    NONE Performed at [facility], [address], [place], [place] [address]    Culture MULTIPLE SPECIES PRESENT, SUGGEST RECOLLECTION (A)  Final   Report Status 06/19/2020 FINAL  Final  Blood culture (routine single)     Status: None (Preliminary result)   Collection Time: 06/17/20  7:29 PM   Specimen: BLOOD RIGHT HAND  Result Value Ref Range Status   Specimen Description BLOOD RIGHT HAND  Final   Special Requests   Final    BOTTLES DRAWN AEROBIC AND ANAEROBIC Blood Culture adequate volume   Culture   Final    NO GROWTH 2  DAYS Performed at [facility], [place] [address], [place], [place] [address]    Report Status PENDING  Incomplete  SARS Coronavirus 2 by RT PCR (hospital order, performed in [facility] hospital lab) Nasopharyngeal Nasopharyngeal Swab     Status: None   Collection Time: 06/17/20  7:52 PM   Specimen: Nasopharyngeal Swab  Result Value Ref Range Status   SARS Coronavirus 2 NEGATIVE NEGATIVE Final    Comment: (NOTE) SARS-CoV-2 target nucleic acids are NOT DETECTED.  The SARS-CoV-2 RNA is generally detectable in upper and lower  respiratory specimens during the acute phase of infection. The lowest concentration of SARS-CoV-2 viral copies this assay can detect is 250 copies / mL. A negative result does not preclude SARS-CoV-2 infection and should not be used as the sole basis for treatment or other patient management decisions.  A negative result may occur with improper specimen collection / handling, submission of specimen other than nasopharyngeal swab, presence of viral mutation(s) within the areas targeted by this assay, and inadequate number of viral copies (<250 copies / mL). A negative result must be combined with clinical observations, patient history, and epidemiological information.  Fact Sheet for Patients:   StrictlyIdeas.no  Fact Sheet for Healthcare Providers: BankingDealers.co.za  This test is not yet approved or  cleared by the [place] FDA and has been authorized for detection and/or diagnosis of SARS-CoV-2 by FDA under an Emergency Use Authorization (EUA).  This EUA will remain in effect (meaning this test can be used) for the duration of the COVID-19 declaration under Section 564(b)(1) of the Act, 21 U.S.C. section 360bbb-3(b)(1), unless the authorization is terminated or revoked sooner.  Performed at [facility], [place] [address], [place], [place] [address]   Sputum culture     Status: None   Collection  Time: 06/18/20  5:59 PM   Specimen: Expectorated Sputum  Result Value Ref Range Status   Specimen Description EXPECTORATED SPUTUM  Final   Special Requests NONE  Final   Sputum evaluation   Final    THIS SPECIMEN IS ACCEPTABLE FOR SPUTUM CULTURE Performed at [facility], [place] [address], [place], [place] [address]    Report Status 06/19/2020 FINAL  Final  Culture, respiratory     Status: None (Preliminary result)   Collection Time: 06/18/20  5:59 PM  Result Value Ref Range Status   Specimen Description EXPECTORATED SPUTUM  Final   Special Requests NONE Reflexed from [id number]  Final   Gram Stain   Final    NO WBC SEEN RARE GRAM POSITIVE COCCI IN PAIRS IN CLUSTERS Performed at [facility], [place] [address], [place],  [address]    Culture PENDING  Incomplete   Report Status PENDING  Incomplete     Labs: BNP (last 3 results) Recent Labs    11/18/19 1209 06/17/20 1924  BNP 18 14.4   Basic Metabolic Panel: Recent Labs  Lab 06/17/20 1924 06/17/20 1942 06/18/20 0417  NA 140 139 139  K 3.9 3.8 4.4  CL 101  --  103  CO2 30  --  27  GLUCOSE 147*  --  164*  BUN 17  --  17  CREATININE 0.92  --  0.84  CALCIUM 8.7*  --  8.5*  MG  --   --  2.6*   Liver Function Tests: Recent Labs  Lab 06/17/20 1924 06/18/20 0417  AST 14* 16  ALT 22 20  ALKPHOS 51 48  BILITOT 0.6 0.7  PROT 5.6* 5.5*  ALBUMIN 3.4* 3.2*   No results for input(s): LIPASE, AMYLASE in the last 168 hours. No results for input(s): AMMONIA in the last 168 hours. CBC: Recent Labs  Lab 06/17/20 1924 06/17/20 1942 06/18/20 0417 06/19/20 0750  WBC 15.8*  --  18.2* 13.1*  NEUTROABS 14.7*  --  17.7*  --   HGB 12.3* 12.6* 11.7* 10.2*  HCT 38.3* 37.0* 36.0* 32.4*  MCV 101.9*  --  100.6* 101.9*  PLT 145*  --  137* 135*   Cardiac Enzymes: No results  for input(s): CKTOTAL, CKMB, CKMBINDEX, TROPONINI in the last 168 hours. BNP: Invalid input(s): POCBNP CBG: No results for input(s): GLUCAP  in the last 168 hours. D-Dimer No results for input(s): DDIMER in the last 72 hours. Hgb A1c Recent Labs    06/18/20 0417  HGBA1C 5.8*   Lipid Profile No results for input(s): CHOL, HDL, LDLCALC, TRIG, CHOLHDL, LDLDIRECT in the last 72 hours. Thyroid function studies No results for input(s): TSH, T4TOTAL, T3FREE, THYROIDAB in the last 72 hours.  Invalid input(s): FREET3 Anemia work up No results for input(s): VITAMINB12, FOLATE, FERRITIN, TIBC, IRON, RETICCTPCT in the last 72 hours. Urinalysis    Component Value Date/Time   COLORURINE YELLOW 06/17/2020 2156   APPEARANCEUR CLEAR 06/17/2020 2156   LABSPEC 1.015 06/17/2020 2156   PHURINE 7.0 06/17/2020 2156   GLUCOSEU 50 (A) 06/17/2020 2156   HGBUR NEGATIVE 06/17/2020 2156   BILIRUBINUR NEGATIVE 06/17/2020 2156   [place] NEGATIVE 06/17/2020 2156   PROTEINUR NEGATIVE 06/17/2020 2156   NITRITE NEGATIVE 06/17/2020 2156   LEUKOCYTESUR NEGATIVE 06/17/2020 2156   Sepsis Labs Invalid input(s): PROCALCITONIN,  WBC,  LACTICIDVEN Microbiology Recent Results (from the past 240 hour(s))  Urine culture     Status: Abnormal   Collection Time: 06/17/20  7:10 PM   Specimen: In/Out Cath Urine  Result Value Ref Range Status   Specimen Description IN/OUT CATH URINE  Final   Special Requests   Final    NONE Performed at [facility], [address], [place], [place] [address]    Culture MULTIPLE SPECIES PRESENT, SUGGEST RECOLLECTION (A)  Final   Report Status 06/19/2020 FINAL  Final  Blood culture (routine single)     Status: None (Preliminary result)   Collection Time: 06/17/20  7:29 PM   Specimen: BLOOD RIGHT HAND  Result Value Ref Range Status   Specimen Description BLOOD RIGHT HAND  Final   Special Requests   Final    BOTTLES DRAWN AEROBIC AND ANAEROBIC Blood Culture adequate volume   Culture   Final    NO GROWTH 2 DAYS Performed at [facility], [place] [address], [place], [place] [address]    Report Status PENDING   Incomplete  SARS Coronavirus 2 by RT PCR (hospital order, performed in [facility] hospital lab) Nasopharyngeal Nasopharyngeal Swab     Status: None   Collection Time: 06/17/20  7:52 PM   Specimen: Nasopharyngeal Swab  Result Value Ref Range Status   SARS Coronavirus 2 NEGATIVE NEGATIVE Final    Comment: (NOTE) SARS-CoV-2 target nucleic acids are NOT DETECTED.  The SARS-CoV-2 RNA is generally detectable in upper and lower respiratory specimens during the acute phase of infection. The lowest concentration of SARS-CoV-2 viral copies this assay can detect is 250 copies / mL. A negative result does not preclude SARS-CoV-2 infection and should not be used as the sole basis for treatment or other patient management decisions.  A negative result may occur with improper specimen collection / handling, submission of specimen other than nasopharyngeal swab, presence of viral mutation(s) within the areas targeted by this assay, and inadequate number of viral copies (<250 copies / mL). A negative result must be combined with clinical observations, patient history, and epidemiological information.  Fact Sheet for Patients:   StrictlyIdeas.no  Fact Sheet for Healthcare Providers: BankingDealers.co.za  This test is not yet approved or  cleared by the [place] FDA and has been authorized for detection and/or diagnosis of SARS-CoV-2 by FDA under an Emergency Use  Authorization (EUA).  This EUA will remain in effect (meaning this test can be used) for the duration of the COVID-19 declaration under Section 564(b)(1) of the Act, 21 U.S.C. section 360bbb-3(b)(1), unless the authorization is terminated or revoked sooner.  Performed at [facility], [place] [address] [place], [place] [address]   Sputum culture     Status: None   Collection Time: 06/18/20  5:59 PM   Specimen: Expectorated Sputum  Result Value Ref Range Status   Specimen  Description EXPECTORATED SPUTUM  Final   Special Requests NONE  Final   Sputum evaluation   Final    THIS SPECIMEN IS ACCEPTABLE FOR SPUTUM CULTURE Performed at [facility], [place] [address] [place], [place] [address]    Report Status 06/19/2020 FINAL  Final  Culture, respiratory     Status: None (Preliminary result)   Collection Time: 06/18/20  5:59 PM  Result Value Ref Range Status   Specimen Description EXPECTORATED SPUTUM  Final   Special Requests NONE Reflexed from [id number]  Final   Gram Stain   Final    NO WBC SEEN RARE GRAM POSITIVE COCCI IN PAIRS IN CLUSTERS Performed at [facility], [place] [address] [place], [place] [address]    Culture PENDING  Incomplete   Report Status PENDING  Incomplete     Time coordinating discharge: 40 minutes  SIGNED:   Elmarie Shiley, MD  [facility]

## 2020-06-20 NOTE — Discharge Instructions (Signed)
Resume azithro after completion of oral antibiotics

## 2020-06-20 NOTE — Telephone Encounter (Signed)
Thanks Garner Nash, DO [facility] 06/20/2020 12:39 AM

## 2020-06-21 ENCOUNTER — Telehealth: Payer: Self-pay | Admitting: Pulmonary Disease

## 2020-06-21 LAB — CULTURE, RESPIRATORY W GRAM STAIN
Culture: NORMAL
Gram Stain: NONE SEEN

## 2020-06-21 NOTE — Telephone Encounter (Signed)
Called Hassan Rowan at [facility], she is refaxing this order.

## 2020-06-22 LAB — CULTURE, BLOOD (SINGLE)
Culture: NO GROWTH
Special Requests: ADEQUATE

## 2020-06-23 DIAGNOSIS — J9611 Chronic respiratory failure with hypoxia: Secondary | ICD-10-CM | POA: Diagnosis not present

## 2020-06-23 DIAGNOSIS — J439 Emphysema, unspecified: Secondary | ICD-10-CM | POA: Diagnosis not present

## 2020-06-23 NOTE — Telephone Encounter (Signed)
Placed fax in Dr. Juline Patch box for provider review.

## 2020-06-30 ENCOUNTER — Telehealth: Payer: Self-pay | Admitting: Cardiovascular Disease

## 2020-06-30 NOTE — Telephone Encounter (Signed)
Sent to primary nurse as FYI 

## 2020-06-30 NOTE — Telephone Encounter (Signed)
New message    Just letting the nurse know that patient called and requested his 07-07-20 appointment be a virtual visit.  Not sure if he needed to come to the office.  If this is not ok, please call patient.  If this is ok with Dr Oval Linsey, you do not have to call pt back.

## 2020-07-05 ENCOUNTER — Encounter: Payer: Self-pay | Admitting: Acute Care

## 2020-07-05 ENCOUNTER — Telehealth: Payer: Self-pay | Admitting: Pulmonary Disease

## 2020-07-05 ENCOUNTER — Ambulatory Visit (INDEPENDENT_AMBULATORY_CARE_PROVIDER_SITE_OTHER): Payer: Medicare Other | Admitting: Acute Care

## 2020-07-05 ENCOUNTER — Other Ambulatory Visit: Payer: Self-pay

## 2020-07-05 DIAGNOSIS — J9611 Chronic respiratory failure with hypoxia: Secondary | ICD-10-CM

## 2020-07-05 DIAGNOSIS — Z515 Encounter for palliative care: Secondary | ICD-10-CM

## 2020-07-05 DIAGNOSIS — J181 Lobar pneumonia, unspecified organism: Secondary | ICD-10-CM | POA: Diagnosis not present

## 2020-07-05 NOTE — Patient Instructions (Addendum)
It is good to talk with you We will do a follow up CT scan in 3-4 weeks. We will call you to schedule the CT scan  We will call you with the results. If needed we will schedle an in person follow up OV with Dr. Valeta Harms or Wyn Quaker NP Continue you Duo-nebs and albuterol rescue as you have been doing Continue wearing oxygen at [address] [place] , saturation goal is > 88% Continue 10 mg daily prednisone and Monday Wednesday Friday azithromycin. Please contact office for sooner follow up if symptoms do not improve or worsen or seek emergency care

## 2020-07-05 NOTE — Progress Notes (Signed)
Virtual Visit via Telephone Note  I connected with Andrew [name] on 07/05/20 at  2:30 PM EDT by telephone and verified that I am speaking with the correct person using two identifiers.  Location: Patient: At home Provider:In the office [place], [place], [place], [address]   I discussed the limitations, risks, security and privacy concerns of performing an evaluation and management service by telephone and the availability of in person appointments. I also discussed with the patient that there may be a patient responsible charge related to this service. The patient expressed understanding and agreed to proceed.  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 Adjacent tree-in-bud opacities. Findings most consistent with pneumonia.   History of Present Illness: Pt. Presents for telephone hospital follow up.He endorses feeling much better.  He was hospitalized from 7/31-06/20/2020 for pneumonia. He had also been hospitalized 7/11-7/17 for COPD exacerbation. For the most recent admission, He was treated with antibiotics, oxygen, nebulizer treatment and steroids.He has been able to wean his oxygen back to his baseline 3 L. He states he has been doing well since discharge. He has had a tele visit with  Dr. Raliegh Ip, his PCP, and he has had labs drawn by hospice . Dr. Raliegh Ip will evaluate those.  He states he has been using his DuoNebs and rescue nebs. He states he has clear to white secretions. He is wearing his oxygen at 3 L [place]. Sats are about 97% at rest, and they are near 80% with exertion. He turns up is oxygen until sats rebound, and then he turns it back down.   He needs follow up imaging,to re-evaluate the infiltrates seen on CT.  We will order for the first week of September and call with results He will need an in person follow up if pneumonia has not resolved. It is very difficult to do hospital follow ups by phone without the ability to examine the patient. He was able to speak in full sentences. I did not hear any wheezing or breathlessness.  He is using his flutter valve and Mucinex daily. He has very few secretions.  He is compliant with his 10 mg daily prednisone and Monday Wednesday Friday azithromycin.  He denies any fever, chest pain, orthopnea or hemoptysis.     Observations/Objective: 06/17/2020 Ill-defined patchy and nodular opacities within the right middle lobe with small air bronchograms and possible cavitary component. Adjacent tree-in-bud opacities. Findings most consistent with pneumonia. Recommend imaging follow-up after course of treatment to ensure resolution. This this area is difficult to accurately assess on radiograph, follow-up CT may be warranted. Areas of mucous plugging in the right lower lobe with rather extensive mucoid impaction. Dependent opacity in the right lower lobe may be postobstructive or infectious. Small right pleural effusion. Advanced emphysema with multifocal bulla and blebs. Sequela of prior granulomatous disease with calcified granulomas and calcified right hilar lymph nodes. Distended gallbladder with layering gallstones.  Assessment and Plan: Follow up Pneumonia requiring hospitalization 06/17/2020-06/20/2020 Improving clinically Has had PCP follow up with labs Compliant with maintenance nebs, rescue nebs, and prednisone 10 mg daily, in addition to MWF azithromycin. Plan It is good to talk with you We will do a follow up CT scan in 3-4 weeks  to re-assess the pneumonia. We will call you to schedule the CT scan  We will call you with the results once it has been done . If pneumonia is not  resolved  we will schedule an in person follow up OV with Dr. Valeta Harms or Wyn Quaker NP Continue your Duo-nebs and albuterol rescue as you have been doing Continue wearing oxygen at 3 L [place] , saturation goal is > 88% Continue 10 mg daily prednisone and Monday Wednesday Friday azithromycin. Please contact office for sooner follow up if symptoms do not improve or worsen or seek emergency care   Chronic Hypoxic Respiratory Failure 2/2 COPD Plan Continue wearing oxygen at 3 L [place] , saturation goal is > 88%    Follow Up Instructions: Follow up CT scan first week September , with in office follow up to evaluate    I discussed the assessment and treatment plan with the patient. The patient was provided an opportunity to ask questions and all were answered. The patient agreed with the plan and demonstrated an understanding of the instructions.   The patient was advised to call back or seek an in-person evaluation if the symptoms worsen or if the condition fails to improve as anticipated.  I provided 30 minutes of non-face-to-face time during this encounter.   Magdalen Spatz, NP 07/05/2020 3:52 PM

## 2020-07-05 NOTE — Telephone Encounter (Signed)
ATC Hassan Rowan unable to reach LM to call back office (x1)

## 2020-07-06 NOTE — Telephone Encounter (Signed)
Form signed by Dolores Hoose and faxed to [employer].  Nothing further at this time.

## 2020-07-06 NOTE — Telephone Encounter (Signed)
Andrew [name], please advise if this has been received. Thanks.

## 2020-07-07 ENCOUNTER — Telehealth (INDEPENDENT_AMBULATORY_CARE_PROVIDER_SITE_OTHER): Admitting: Cardiovascular Disease

## 2020-07-07 ENCOUNTER — Encounter: Payer: Self-pay | Admitting: Cardiovascular Disease

## 2020-07-07 VITALS — BP 121/61 | HR 70 | Ht 67.0 in | Wt 130.0 lb

## 2020-07-07 DIAGNOSIS — G459 Transient cerebral ischemic attack, unspecified: Secondary | ICD-10-CM

## 2020-07-07 DIAGNOSIS — Z9989 Dependence on other enabling machines and devices: Secondary | ICD-10-CM | POA: Diagnosis not present

## 2020-07-07 DIAGNOSIS — G4733 Obstructive sleep apnea (adult) (pediatric): Secondary | ICD-10-CM

## 2020-07-07 DIAGNOSIS — I480 Paroxysmal atrial fibrillation: Secondary | ICD-10-CM

## 2020-07-07 DIAGNOSIS — I272 Pulmonary hypertension, unspecified: Secondary | ICD-10-CM

## 2020-07-07 DIAGNOSIS — I493 Ventricular premature depolarization: Secondary | ICD-10-CM | POA: Diagnosis not present

## 2020-07-07 DIAGNOSIS — I10 Essential (primary) hypertension: Secondary | ICD-10-CM | POA: Diagnosis not present

## 2020-07-07 NOTE — Progress Notes (Addendum)
Virtual Visit via Telephone Note   This visit type was conducted due to national recommendations for restrictions regarding the COVID-19 Pandemic (e.g. social distancing) in an effort to limit this patient's exposure and mitigate transmission in our community.  Due to his co-morbid illnesses, this patient is at least at moderate risk for complications without adequate follow up.  This format is felt to be most appropriate for this patient at this time.  The patient did not have access to video technology/had technical difficulties with video requiring transitioning to audio format only (telephone).  All issues noted in this document were discussed and addressed.  No physical exam could be performed with this format.  Please refer to the patient's chart for his  consent to telehealth for [facility].   The patient was identified using 2 identifiers.  Date:  08/10/2020   ID:  Andrew [name], DOB 1941/02/20, MRN [id number]  Patient Location: Home Provider Location: Office/Clinic  PCP:  Lujean Amel, MD  Cardiologist:  Skeet Latch, MD  Electrophysiologist:  None   Evaluation Performed:  Follow-Up Visit  Chief Complaint:  Shortness of breath  History of Present Illness:    The patient does not have symptoms concerning for COVID-19 infection (fever, chills, cough, or new shortness of breath).   History of Present Illness: Andrew [name] is a 79 y.o. 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  Mr. Favia was admitted 08/2017 with pneumonia.  The hospitalization was complicated by atrial fibrillation with rapid ventricular response.  He had more than one episode of atrial fibrillation during the admission.  It was thought to be due to his pneumonia.  He was already on diltiazem prior to admission and metoprolol was added to his regimen.  He was started on Eliquis for  anticoagulation.  Thyroid function was normal.  He had an echo that admission that revealed LVEF 55-60%.  It was noted that he has pulmonary hypertension from severe COPD.  He was previously treated in [place] and was started on sildenafil.  He has been working with Dr. Leeanne Deed the dose was lowered however, the patient declined further weaning.    Mr. Dubuc saw his pulmonologist and was noted to be anemic.  He had positive FOBT testing.  He was referred to Dr. Michail Sermon and underwent upper and lower endoscopy 04/2018.  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  This was treated with APC.  He also had 2 colonic polyps removed.  He was admitted 05/2020 with pneumonia and COPD.  Since then his breathing has improved and is back to baseline.  He is staying home because of his breathing and the pandemic.  He has some stomach discomfort.  He has no chest pain.  He has stable ankle swelling but no orthopnea or PND.  Lasix helps.  At his last appointment he complained of garbled speech and confusion.  This hasn't occurred again.    Past Medical History:  Diagnosis Date  . Anemia   . BiPAP (biphasic positive airway pressure) dependence    Pt denies history of OSA  . BPH (benign prostatic hyperplasia)   . Cancer ([place])    skin  . COPD (chronic obstructive pulmonary disease) ([place])   . Dysphagia   . Emphysema lung ([place])   . GERD (gastroesophageal reflux disease)    "  Silent reflux"  . Hearing loss    right ear  . History of hiatal hernia   . HLD (hyperlipidemia)   . Hypertension   . Oxygen dependent    2-3 liters  . Oxygen dependent   . Pneumonia   . Pulmonary hypertension ([place])   . Sleep apnea    wears cpap  . TIA (transient ischemic attack) 04/27/2020    Past Surgical History:  Procedure Laterality Date  . CARDIAC CATHETERIZATION    . CATARACT EXTRACTION W/ INTRAOCULAR LENS   IMPLANT, BILATERAL    . COLONOSCOPY WITH PROPOFOL N/A 04/20/2018   Procedure: COLONOSCOPY WITH PROPOFOL;  Surgeon: Wilford Corner, MD;  Location: [place];  Service: Endoscopy;  Laterality: N/A;  . ESOPHAGOGASTRODUODENOSCOPY (EGD) WITH PROPOFOL N/A 08/23/2016   Procedure: ESOPHAGOGASTRODUODENOSCOPY (EGD) WITH PROPOFOL;  Surgeon: Wilford Corner, MD;  Location: [facility] ENDOSCOPY;  Service: Endoscopy;  Laterality: N/A;  . ESOPHAGOGASTRODUODENOSCOPY (EGD) WITH PROPOFOL N/A 04/20/2018   Procedure: ESOPHAGOGASTRODUODENOSCOPY (EGD) WITH PROPOFOL;  Surgeon: Wilford Corner, MD;  Location: [place];  Service: Endoscopy;  Laterality: N/A;  . HOT HEMOSTASIS N/A 04/20/2018   Procedure: HOT HEMOSTASIS (ARGON PLASMA COAGULATION/BICAP);  Surgeon: Wilford Corner, MD;  Location: [place];  Service: Endoscopy;  Laterality: N/A;  . LUNG SURGERY    . POLYPECTOMY  04/20/2018   Procedure: POLYPECTOMY;  Surgeon: Wilford Corner, MD;  Location: [facility] ENDOSCOPY;  Service: Endoscopy;;  . TONSILLECTOMY       Current Outpatient Medications  Medication Sig Dispense Refill  . albuterol (PROVENTIL HFA;VENTOLIN HFA) 108 (90 Base) MCG/ACT inhaler Inhale 2 puffs into the lungs every 6 (six) hours as needed for wheezing or shortness of breath. 1 Inhaler 11  . Ascorbic Acid (VITAMIN C) 1000 MG tablet Take 1,000 mg by mouth at bedtime.    Marland Kitchen aspirin EC 81 MG tablet Take 81 mg by mouth daily.    . Cholecalciferol (VITAMIN D) 50 MCG (2000 UT) tablet Take 2,000 Units by mouth at bedtime.    Marland Kitchen diltiazem (CARDIZEM CD) 180 MG 24 hr capsule TAKE 1 CAPSULE EVERY DAY (NEEDS OFFICE VISIT FOR FURTHER REFILLS) (Patient taking differently: Take 180 mg by mouth at bedtime. ) 60 capsule 3  . finasteride (PROSCAR) 5 MG tablet Take 5 mg by mouth 2 (two) times daily.     . furosemide (LASIX) 40 MG tablet Take 40 mg by mouth every Monday, Wednesday, and Friday.     Marland Kitchen guaiFENesin (MUCINEX) 600 MG 12 hr tablet Take 1 tablet (600 mg total) by  mouth 2 (two) times daily. 30 tablet 0  . ipratropium-albuterol (DUONEB) 0.5-2.5 (3) MG/3ML SOLN Take 3 mLs by nebulization every 6 (six) hours as needed. (Patient taking differently: Take 3 mLs by nebulization 3 (three) times daily. ) 360 mL 3  . LORazepam (ATIVAN) 0.5 MG tablet Take 0.5 mg by mouth every 4 (four) hours as needed for anxiety.     . Melatonin 10 MG CAPS Take 1 capsule by mouth at bedtime.    . metoCLOPramide (REGLAN) 10 MG tablet Take 10 mg by mouth 3 (three) times daily before meals.    Marland Kitchen morphine (MSIR) 15 MG tablet Take 15 mg by mouth every 4 (four) hours as needed (pain).     Marland Kitchen omeprazole (PRILOSEC) 40 MG capsule Take 40 mg by mouth 2 (two) times daily.     Marland Kitchen OVER THE COUNTER MEDICATION Take 1 capsule by mouth at bedtime. CBD oil    . OXYGEN Inhale 3 L into the lungs  continuous.     . polyethylene glycol (MIRALAX / GLYCOLAX) packet Take 17 g by mouth daily. (Patient taking differently: Take 17 g by mouth at bedtime. Mix in 8 oz liquid and drink) 14 each 1  . potassium chloride 20 MEQ TBCR Take 20 mEq by mouth daily. (Patient taking differently: Take 20 mEq by mouth every Monday, Wednesday, and Friday. ) 30 tablet 6  . predniSONE (DELTASONE) 10 MG tablet Take 3 tablets for 3 days then resume home dose of 10 mg. 30 tablet 0  . prochlorperazine (COMPAZINE) 10 MG tablet Take 10 mg by mouth every 4 (four) hours as needed for nausea or vomiting.     . rosuvastatin (CRESTOR) 10 MG tablet Take 10 mg by mouth at bedtime.     . senna (SENOKOT) 8.6 MG TABS tablet Take 2 tablets by mouth 2 (two) times daily.    . sildenafil (REVATIO) 20 MG tablet Take 1 tablet (20 mg total) by mouth 2 (two) times daily. 180 tablet 3  . Spacer/Aero-Holding Chambers (AEROCHAMBER MV) inhaler Use as instructed 1 each 0  . tamsulosin (FLOMAX) 0.4 MG CAPS capsule Take 1 capsule (0.4 mg total) by mouth daily after breakfast. 30 capsule 0  . temazepam (RESTORIL) 30 MG capsule Take 1 capsule (30 mg total) by mouth  at bedtime. 30 capsule 5   No current facility-administered medications for this visit.    Allergies:   Patient has no known allergies.    Social History:  The patient  reports that he quit smoking about 35 years ago. His smoking use included cigarettes. He smoked 0.00 packs per day for 0.00 years. He has never used smokeless tobacco. He reports that he does not drink alcohol and does not use drugs.   Family History:  The patient's family history includes Bone cancer in his brother; Cancer in his maternal uncle; Congestive Heart Failure in his maternal grandfather; Pancreatitis in his mother; Stroke in his maternal grandfather.    ROS:  Please see the history of present illness.   Otherwise, review of systems are positive for none.   All other systems are reviewed and negative.   PHYSICAL EXAM: BP 121/61   Pulse 70   Ht 5\' 7"  (1.702 m)   Wt 130 lb (59 kg)   BMI 20.36 kg/m  GENERAL: Sounds well. No acute distress. RESP: Respirations unlabored. NEURO:  Speech fluent.  Cranial nerves grossly intact.  Moves all 4 extremities freely PSYCH:  Cognitively intact, oriented to person place and time  EKG:  EKG is not ordered today. The ekg ordered 09/22/17 demonstrates sinus rhythm.  Rate 61 bpm.   03/23/2018: Sinus bradycardia.  Rate 53 bpm. 01/26/2019: Sinus rhythm.  PACs.  Rate 82 bpm.  Incomplete right bundle branch block.  Echo 09/10/17: Study Conclusions  - Left ventricle: The cavity size was normal. Wall thickness was normal. Systolic function was normal. The estimated ejection fraction was in the range of 55% to 60%. Wall motion was normal; there were no regional wall motion abnormalities. The study is not technically sufficient to allow evaluation of LV diastolic function. - Pulmonary arteries: PA peak pressure: 33 mm Hg (S). - Pericardium, extracardiac: A trivial pericardial effusion was identified.  Impressions:  - Technically difficult; no apical views;  normal LV systolic function; trace MR and mild TR.   Recent Labs: 06/17/2020: B Natriuretic Peptide 39.3 06/18/2020: ALT 20; BUN 17; Creatinine, Ser 0.84; Magnesium 2.6; Potassium 4.4; Sodium 139 06/19/2020: Hemoglobin 10.2; Platelets 135  12/23/2017: Total cholesterol 117, triglycerides 107, HDL 59, LDL 37  Lipid Panel No results found for: CHOL, TRIG, HDL, CHOLHDL, VLDL, LDLCALC, LDLDIRECT    Wt Readings from Last 3 Encounters:  07/07/20 130 lb (59 kg)  06/18/20 134 lb 11.2 oz (61.1 kg)  06/03/20 135 lb 12.8 oz (61.6 kg)      ASSESSMENT AND PLAN:  # Paroxysmal atrial fibrillation:  Mr. Savas  denies any recent palpitations.  He was asymptomatic in atrial fibrillation.  Continue diltiazem and Eliquis.  # Pulmonary hypertension:  Mr. Knebel has been on sildenafil since 2007. PASP was only 33 mmHg on echo 09/10/17. This is odd given that he has significant pulmonary disease. Typically this medication would worsen V/Q mismatch in a patient with severe COPD.   However he has been stable so we will not stop it.  He had some improved with Lasix.  I am unable to examine him given that this is a telephone appointment.  He has been stable taking the lasix three days per week.  # Hyperlipidemia:  Continue rosuvastatin given his episodes of likely TIAs.    Current medicines are reviewed at length with the patient today.  The patient does not have concerns regarding medicines.  The following changes have been made:  none  Labs/ tests ordered today include:  No orders of the defined types were placed in this encounter.  COVID-19 Education: The signs and symptoms of COVID-19 were discussed with the patient and how to seek care for testing (follow up with PCP or arrange E-visit).  The importance of social distancing was discussed today.  Time:   Today, I have spent 25 minutes with the patient with telehealth technology discussing the above problems.     Disposition:   FU with  Hartleigh Edmonston C. Oval Linsey, MD, [facility] in 6 months   Signed, Minard Millirons C. Oval Linsey, MD, [facility]  07/07/2020 4:49 PM    [place]

## 2020-07-07 NOTE — Patient Instructions (Addendum)
Medication Instructions:  Your physician recommends that you continue on your current medications as directed. Please refer to the Current Medication list given to you today.  *If you need a refill on your cardiac medications before your next appointment, please call your pharmacy*  Lab Work: NONE  Testing/Procedures: NONE  Follow-Up: At [facility], you and your health needs are our priority.  As part of our continuing mission to provide you with exceptional heart care, we have created designated Provider Care Teams.  These Care Teams include your primary Cardiologist (physician) and Advanced Practice Providers (APPs -  Physician Assistants and Nurse Practitioners) who all work together to provide you with the care you need, when you need it.  We recommend signing up for the patient portal called "MyChart".  Sign up information is provided on this After Visit Summary.  MyChart is used to connect with patients for Virtual Visits (Telemedicine).  Patients are able to view lab/test results, encounter notes, upcoming appointments, etc.  Non-urgent messages can be sent to your provider as well.   To learn more about what you can do with MyChart, go to [email or web address].    Your next appointment:   01/16/2021 AT 11:00 AM    The format for your next appointment:   In Person  Provider:   You may see Skeet Latch, MD or one of the following Advanced Practice Providers on your designated Care Team:    [name], PA-C  [place], [place]  Coletta Memos, [place]

## 2020-07-10 ENCOUNTER — Emergency Department (HOSPITAL_COMMUNITY)
Admission: EM | Admit: 2020-07-10 | Discharge: 2020-07-10 | Disposition: A | Discharge status: Home / Self Care | Attending: Emergency Medicine | Admitting: Emergency Medicine

## 2020-07-10 ENCOUNTER — Emergency Department (HOSPITAL_COMMUNITY)

## 2020-07-10 ENCOUNTER — Encounter (HOSPITAL_COMMUNITY): Payer: Self-pay | Admitting: Emergency Medicine

## 2020-07-10 DIAGNOSIS — Z87891 Personal history of nicotine dependence: Secondary | ICD-10-CM | POA: Insufficient documentation

## 2020-07-10 DIAGNOSIS — Z20822 Contact with and (suspected) exposure to covid-19: Secondary | ICD-10-CM | POA: Insufficient documentation

## 2020-07-10 DIAGNOSIS — E8779 Other fluid overload: Secondary | ICD-10-CM | POA: Insufficient documentation

## 2020-07-10 DIAGNOSIS — J439 Emphysema, unspecified: Secondary | ICD-10-CM | POA: Diagnosis not present

## 2020-07-10 DIAGNOSIS — R0602 Shortness of breath: Secondary | ICD-10-CM | POA: Diagnosis not present

## 2020-07-10 DIAGNOSIS — R069 Unspecified abnormalities of breathing: Secondary | ICD-10-CM | POA: Diagnosis not present

## 2020-07-10 DIAGNOSIS — I10 Essential (primary) hypertension: Secondary | ICD-10-CM | POA: Insufficient documentation

## 2020-07-10 DIAGNOSIS — R1310 Dysphagia, unspecified: Secondary | ICD-10-CM | POA: Diagnosis not present

## 2020-07-10 DIAGNOSIS — I959 Hypotension, unspecified: Secondary | ICD-10-CM | POA: Diagnosis not present

## 2020-07-10 DIAGNOSIS — J441 Chronic obstructive pulmonary disease with (acute) exacerbation: Secondary | ICD-10-CM | POA: Diagnosis not present

## 2020-07-10 DIAGNOSIS — R000 Tachycardia, unspecified: Secondary | ICD-10-CM | POA: Diagnosis not present

## 2020-07-10 DIAGNOSIS — E8770 Fluid overload, unspecified: Secondary | ICD-10-CM | POA: Diagnosis not present

## 2020-07-10 DIAGNOSIS — R0902 Hypoxemia: Secondary | ICD-10-CM | POA: Diagnosis not present

## 2020-07-10 LAB — BASIC METABOLIC PANEL
Anion gap: 9 (ref 5–15)
BUN: 16 mg/dL (ref 8–23)
CO2: 31 mmol/L (ref 22–32)
Calcium: 9.1 mg/dL (ref 8.9–10.3)
Chloride: 101 mmol/L (ref 98–111)
Creatinine, Ser: 0.96 mg/dL (ref 0.61–1.24)
GFR calc Af Amer: >60 mL/min (ref >60)
GFR calc non Af Amer: >60 mL/min (ref >60)
Glucose, Bld: 137 mg/dL — ABNORMAL HIGH (ref 70–99)
Potassium: 4.2 mmol/L (ref 3.5–5.1)
Sodium: 141 mmol/L (ref 135–145)

## 2020-07-10 LAB — I-STAT VENOUS BLOOD GAS, ED
Acid-Base Excess: 7 mmol/L — ABNORMAL HIGH (ref 0.0–2.0)
Bicarbonate: 33.3 mmol/L — ABNORMAL HIGH (ref 20.0–28.0)
Calcium, Ion: 1.21 mmol/L (ref 1.15–1.40)
HCT: 35 % — ABNORMAL LOW (ref 39.0–52.0)
Hemoglobin: 11.9 g/dL — ABNORMAL LOW (ref 13.0–17.0)
O2 Saturation: 88 %
Potassium: 4.2 mmol/L (ref 3.5–5.1)
Sodium: 140 mmol/L (ref 135–145)
TCO2: 35 mmol/L — ABNORMAL HIGH (ref 22–32)
pCO2, Ven: 54 mmHg (ref 44.0–60.0)
pH, Ven: 7.398 (ref 7.250–7.430)
pO2, Ven: 56 mmHg — ABNORMAL HIGH (ref 32.0–45.0)

## 2020-07-10 LAB — CBC WITH DIFFERENTIAL/PLATELET
Abs Immature Granulocytes: 0.02 10*3/uL (ref 0.00–0.07)
Basophils Absolute: 0 10*3/uL (ref 0.0–0.1)
Basophils Relative: 0 %
Eosinophils Absolute: 0 10*3/uL (ref 0.0–0.5)
Eosinophils Relative: 0 %
HCT: 36.8 % — ABNORMAL LOW (ref 39.0–52.0)
Hemoglobin: 11.8 g/dL — ABNORMAL LOW (ref 13.0–17.0)
Immature Granulocytes: 0 %
Lymphocytes Relative: 7 %
Lymphs Abs: 0.5 10*3/uL — ABNORMAL LOW (ref 0.7–4.0)
MCH: 32.6 pg (ref 26.0–34.0)
MCHC: 32.1 g/dL (ref 30.0–36.0)
MCV: 101.7 fL — ABNORMAL HIGH (ref 80.0–100.0)
Monocytes Absolute: 0.5 10*3/uL (ref 0.1–1.0)
Monocytes Relative: 7 %
Neutro Abs: 6.5 10*3/uL (ref 1.7–7.7)
Neutrophils Relative %: 86 %
Platelets: 208 10*3/uL (ref 150–400)
RBC: 3.62 MIL/uL — ABNORMAL LOW (ref 4.22–5.81)
RDW: 13.2 % (ref 11.5–15.5)
WBC: 7.5 10*3/uL (ref 4.0–10.5)
nRBC: 0 % (ref 0.0–0.2)

## 2020-07-10 LAB — TROPONIN I (HIGH SENSITIVITY): Troponin I (High Sensitivity): 6 ng/L (ref <18)

## 2020-07-10 LAB — BRAIN NATRIURETIC PEPTIDE: B Natriuretic Peptide: 112.7 pg/mL — ABNORMAL HIGH (ref 0.0–100.0)

## 2020-07-10 LAB — SARS CORONAVIRUS 2 BY RT PCR (HOSPITAL ORDER, PERFORMED IN ~~LOC~~ HOSPITAL LAB): SARS Coronavirus 2: NEGATIVE

## 2020-07-10 MED ORDER — SODIUM CHLORIDE 0.9 % IV SOLN
500.0000 mg | Freq: Once | INTRAVENOUS | Status: AC
  Administered 2020-07-10: 500 mg via INTRAVENOUS
  Filled 2020-07-10: qty 500

## 2020-07-10 MED ORDER — IPRATROPIUM-ALBUTEROL 0.5-2.5 (3) MG/3ML IN SOLN
3.0000 mL | RESPIRATORY_TRACT | Status: DC
  Administered 2020-07-10 (×2): 3 mL via RESPIRATORY_TRACT
  Filled 2020-07-10 (×3): qty 3

## 2020-07-10 MED ORDER — METHYLPREDNISOLONE SODIUM SUCC 125 MG IJ SOLR
125.0000 mg | Freq: Once | INTRAMUSCULAR | Status: AC
  Administered 2020-07-10: 125 mg via INTRAVENOUS
  Filled 2020-07-10: qty 2

## 2020-07-10 MED ORDER — PREDNISONE 10 MG PO TABS: 50.0000 mg | ORAL_TABLET | Freq: Every day | ORAL | 0 refills | Status: AC

## 2020-07-10 MED ORDER — FUROSEMIDE 10 MG/ML IJ SOLN
40.0000 mg | Freq: Once | INTRAMUSCULAR | Status: AC
  Administered 2020-07-10: 40 mg via INTRAVENOUS
  Filled 2020-07-10: qty 4

## 2020-07-10 MED ORDER — IPRATROPIUM-ALBUTEROL 20-100 MCG/ACT IN AERS
1.0000 | INHALATION_SPRAY | RESPIRATORY_TRACT | Status: AC
  Administered 2020-07-10 (×2): 1 via RESPIRATORY_TRACT
  Filled 2020-07-10 (×2): qty 4

## 2020-07-10 MED ORDER — DOXYCYCLINE HYCLATE 100 MG PO CAPS: 100.0000 mg | ORAL_CAPSULE | Freq: Two times a day (BID) | ORAL | 0 refills | Status: AC

## 2020-07-10 MED ORDER — IPRATROPIUM-ALBUTEROL 0.5-2.5 (3) MG/3ML IN SOLN: 3.0000 mL | RESPIRATORY_TRACT | Status: DC

## 2020-07-10 MED ORDER — SODIUM CHLORIDE 0.9 % IV SOLN
1.0000 g | Freq: Once | INTRAVENOUS | Status: AC
  Administered 2020-07-10: 1 g via INTRAVENOUS
  Filled 2020-07-10: qty 10

## 2020-07-10 NOTE — ED Provider Notes (Signed)
[facility] EMERGENCY DEPARTMENT Provider Note   History    Chief Complaint  Patient presents with  . Shortness of Breath  . Abdominal Pain    Notably, much of this history is obtained from the patient who is awake, alert and oriented to person, place, time, and situation and able to provide an adequate account.  Andrew [name] is a 79 y.o. male with MHx sig for hypertension, hyperlipidemia, pulmonary hypertension, severe COPD with baseline oxygen requirement of 3 L presents to the ED with complaint of shortness of breath.  Patient states that this began ever since he woke up this morning, worsened with exertion.  He denies change in cough though or fevers or chills.  Denies leg swelling.  Per EMS patient has a "100 feet" of oxygen tubing throughout the house.  Sats on fire arrival 89% on baseline oxygen.  Placed on 6 L for transport by EMS satting 98%.  They initially stated  Patient.  The history is provided by the patient and the EMS personnel.  Illness Quality:  Shortness of breath Severity:  Moderate Onset quality:  Gradual Duration:  1 day Timing:  Constant Progression:  Waxing and waning Chronicity:  Recurrent Context:  Severe COPD Associated symptoms: cough and shortness of breath   Associated symptoms: no abdominal pain, no chest pain, no fever, no headaches, no rash and no vomiting     On chart review, recently admitted for COPD exacerbation, discharged 06/20/2020 on prednisone.  Past Medical History:  Diagnosis Date  . Anemia   . BiPAP (biphasic positive airway pressure) dependence    Pt denies history of OSA  . BPH (benign prostatic hyperplasia)   . Cancer ([place])    skin  . COPD (chronic obstructive pulmonary disease) ([place])   . Dysphagia   . Emphysema lung ([place])   . GERD (gastroesophageal reflux disease)    " Silent reflux"  . Hearing loss    right ear  . History of hiatal hernia   . HLD (hyperlipidemia)   . Hypertension   . Oxygen  dependent    2-3 liters  . Oxygen dependent   . Pneumonia   . Pulmonary hypertension ([place])   . Sleep apnea    wears cpap  . TIA (transient ischemic attack) 04/27/2020     Past Surgical History:  Procedure Laterality Date  . CARDIAC CATHETERIZATION    . CATARACT EXTRACTION W/ INTRAOCULAR LENS  IMPLANT, BILATERAL    . COLONOSCOPY WITH PROPOFOL N/A 04/20/2018   Procedure: COLONOSCOPY WITH PROPOFOL;  Surgeon: Wilford Corner, MD;  Location: [facility];  Service: Endoscopy;  Laterality: N/A;  . ESOPHAGOGASTRODUODENOSCOPY (EGD) WITH PROPOFOL N/A 08/23/2016   Procedure: ESOPHAGOGASTRODUODENOSCOPY (EGD) WITH PROPOFOL;  Surgeon: Wilford Corner, MD;  Location: [facility] ENDOSCOPY;  Service: Endoscopy;  Laterality: N/A;  . ESOPHAGOGASTRODUODENOSCOPY (EGD) WITH PROPOFOL N/A 04/20/2018   Procedure: ESOPHAGOGASTRODUODENOSCOPY (EGD) WITH PROPOFOL;  Surgeon: Wilford Corner, MD;  Location: [facility];  Service: Endoscopy;  Laterality: N/A;  . HOT HEMOSTASIS N/A 04/20/2018   Procedure: HOT HEMOSTASIS (ARGON PLASMA COAGULATION/BICAP);  Surgeon: Wilford Corner, MD;  Location: [facility];  Service: Endoscopy;  Laterality: N/A;  . LUNG SURGERY    . POLYPECTOMY  04/20/2018   Procedure: POLYPECTOMY;  Surgeon: Wilford Corner, MD;  Location: [facility] ENDOSCOPY;  Service: Endoscopy;;  . TONSILLECTOMY       Family History  Problem Relation Age of Onset  . Cancer Maternal Uncle   . Pancreatitis Mother   . Bone cancer Brother   .  Stroke Maternal Grandfather   . Congestive Heart Failure Maternal Grandfather     Social History   Tobacco Use  . Smoking status: Former Smoker    Packs/day: 0.00    Years: 0.00    Pack years: 0.00    Types: Cigarettes    Quit date: 10/01/1984    Years since quitting: 35.8  . Smokeless tobacco: Never Used  Substance Use Topics  . Alcohol use: No    Alcohol/week: 0.0 standard drinks     Review of Systems  Constitutional: Negative for chills and fever.  HENT: Negative for  facial swelling and voice change.   Eyes: Negative for redness and visual disturbance.  Respiratory: Positive for cough and shortness of breath.   Cardiovascular: Negative for chest pain and palpitations.  Gastrointestinal: Negative for abdominal pain and vomiting.       Positive for abdominal "tremor"  Genitourinary: Negative for difficulty urinating and dysuria.  Musculoskeletal: Negative for gait problem and joint swelling.  Skin: Negative for rash and wound.  Neurological: Negative for dizziness and headaches.  Psychiatric/Behavioral: Negative for confusion and suicidal ideas.     Physical Exam    Vitals:   07/10/20 1900 07/10/20 2023  BP: (!) 147/73 (!) 168/80  Pulse: 87 100  Resp: 19 20  Temp:  98.1 F (36.7 C)  SpO2: 96% 96%    Physical Exam Constitutional:      General: He is not in acute distress.    Appearance: He is not ill-appearing.  HENT:     Head: Normocephalic and atraumatic.     Mouth/Throat:     Mouth: Mucous membranes are moist.     Pharynx: Oropharynx is clear.  Eyes:     General: No scleral icterus.    Pupils: Pupils are equal, round, and reactive to light.  Cardiovascular:     Rate and Rhythm: Normal rate and regular rhythm.     Pulses: Normal pulses.  Pulmonary:     Effort: Pulmonary effort is normal. No tachypnea, accessory muscle usage or respiratory distress.     Comments: Minimal expiratory wheeze Abdominal:     General: There is no distension.     Tenderness: There is no abdominal tenderness.  Musculoskeletal:        General: No tenderness or deformity.     Cervical back: Normal range of motion and neck supple.  Neurological:     General: No focal deficit present.     Mental Status: He is alert and oriented to person, place, and time.  Psychiatric:        Mood and Affect: Mood normal.        Behavior: Behavior normal.     ED Course    MDM Cynthia Stainback is a 79 y.o.  male w/ MHx and HPI as detailed above  Medical Decision  Making: Possible differential diagnoses I considered included:  COPD exacerbation, CHF exacerbation, COVID-19, pneumonia, pleural effusion, PE, ACS  Workup/Thought Process:  Vitals, labs, EKGs, and imaging were reviewed by myself, and were significant for: Vitals:   07/10/20 1900 07/10/20 2023  BP: (!) 147/73 (!) 168/80  Pulse: 87 100  Resp: 19 20  Temp:  98.1 F (36.7 C)  SpO2: 96% 96%    Clinical Course as of Jul 11 41  Mon Jul 10, 2020  1644 Read discussed with radiology, apparent foreign body appreciated could represent a vessel on end, if representing foreign body favored to be in esophagus versus bronchus.  Low clinical suspicion  for foreign body.  DG Chest 2 View [JR]  7829 Consistent with prior  Hemoglobin(!): 11.8 [JR]    Clinical Course User Index [JR] Renold Genta, MD    Patient presents in the respiratory distress, does have a long complicated history of COPD exacerbation as well as a history of CHF.  On exam is not grossly volume overloaded, mildly tachypneic but this is likely the patient's baseline.  Oxygen requirement is at baseline using a saturation goal of 88%.  We will obtain standard shortness of breath work-up including a VBG, EKG, troponin, BNP, chest x-ray.  Will treat empirically for COPD given the patient's history as well as expiratory wheezing.  EKG obtain and interpreted by myself: Compared to prior: 06/19/2020 rate: 89 rhythm: Sinus Axis appropriate QRS: 89 QTc: 433 no evidence of ischemia or arrythymia or any concerning findings related to the patients presentation.  VBG unremarkable, troponin negative, CBC and chemistries unremarkable.  BNP elevated for him however not truly shocking at 112 and patient's exam is not suggestive of significant volume overload.  Patient given 40 mg of IV Lasix, 3 back-to-back DuoNeb's with significant improvement, suspect some degree of mild COPD exacerbation  Counseled discussed with PCP plan for diuresis going  forward, likely will need daily diuresis over the next week.  Will prescribe prednisone in addition to his home dose of 10 daily as well as a course of doxycycline.  He has breathing treatments at home he says.  The plan for this patient was discussed with Dr. Vallery Ridge who voiced agreement and who oversaw evaluation and treatment of this patient.   Interventions/Procedures:   Impression: On re-evaluation patients VS were stable and completely symptomatically improved, saturating well on home oxygen  I discussed all relevant finding and clinical impression to the patient. The patient expressed understanding of their condition as well as the disposition proposed and expressed agreement.  Clinical Impression: 1. COPD exacerbation (HCC)   2. Cardiac volume overload      Labs, studies and imaging reviewed by myself and considered in medical decision making if ordered. Imaging interpreted by radiology. Pt was discussed with my attending, Dr. Johnney Killian.  Electronically signed by:  Renold Genta 12:42 AM 07/11/2020  [name] was evaluated in Emergency Department on 07/11/2020 for the symptoms described in the history of present illness. He/she was evaluated in the context of the global COVID-19 pandemic, which necessitated consideration that the patient might be at risk for infection with the SARS-CoV-2 virus that causes COVID-19. Institutional protocols and algorithms that pertain to the evaluation of patients at risk for COVID-19 are in a state of rapid change based on information released by regulatory bodies including the CDC and federal and state organizations. These policies and algorithms were followed during the patient's care in the ED.    Renold Genta, MD 07/11/20 Alena Bills    Charlesetta Shanks, MD 07/22/20 [phone]

## 2020-07-10 NOTE — Progress Notes (Signed)
[facility]) Hospital Liaison Note     This patient is enrolled in our hospice care services in the community.  [facility] will continue to follow for any discharge planning needs and to coordinate continuation of hospice care.   If you have questions or need assistance, please call [phone] or contact the hospital Liaison listed on [facility].     Thank you for the opportunity to participate in this patient's care.     Domenic Moras, BSN, RN [facility] Liaison   [phone]

## 2020-07-10 NOTE — ED Triage Notes (Signed)
Pt arrived via [facility], for SOB that started today. Normally on 3L on O2 at home.Patient also states that he has had stomach pain for the past few weeks along with a sensation that he describes as "shaking or vibrating." O2 sat 93% on 6L in ambulance.

## 2020-07-12 DIAGNOSIS — M25473 Effusion, unspecified ankle: Secondary | ICD-10-CM | POA: Diagnosis not present

## 2020-07-12 DIAGNOSIS — E440 Moderate protein-calorie malnutrition: Secondary | ICD-10-CM | POA: Diagnosis not present

## 2020-07-12 DIAGNOSIS — Z79899 Other long term (current) drug therapy: Secondary | ICD-10-CM | POA: Diagnosis not present

## 2020-07-12 DIAGNOSIS — J9611 Chronic respiratory failure with hypoxia: Secondary | ICD-10-CM | POA: Diagnosis not present

## 2020-07-12 DIAGNOSIS — I10 Essential (primary) hypertension: Secondary | ICD-10-CM | POA: Diagnosis not present

## 2020-07-14 ENCOUNTER — Telehealth: Payer: Self-pay | Admitting: Pulmonary Disease

## 2020-07-14 ENCOUNTER — Other Ambulatory Visit: Payer: Self-pay | Admitting: Pulmonary Disease

## 2020-07-14 MED ORDER — ASPIRIN EC 81 MG PO TBEC: 81.0000 mg | DELAYED_RELEASE_TABLET | Freq: Every day | ORAL | 3 refills | Status: AC

## 2020-07-14 NOTE — Telephone Encounter (Signed)
Yes taper to 10mg  and stay on that Can taper by 10mg  every 3 days until back on 10mg  daily Garner Nash, DO [facility] 07/14/2020 4:24 PM

## 2020-07-14 NOTE — Telephone Encounter (Signed)
Called and spoke with Almyra Free about Dr. Fabio Bering recs to taper on prednisone. She expressed understanding. Nothing further needed at this time.

## 2020-07-14 NOTE — Telephone Encounter (Signed)
Almyra Free called back and is asking if patient is supposed to taper down on prednisone or take as prescribed and then go back to 10 mg daily. Patient was given prednisone in ED on 8/23.  Please advise  predniSONE (DELTASONE) 10 MG tablet [[id number]]   Order Details Dose: 50 mg Route: Oral Frequency: Daily with breakfast  Dispense Quantity: 25 tablet Refills: 0       Sig: Take 5 tablets (50 mg total) by mouth daily with breakfast for 5 days.

## 2020-07-14 NOTE — Telephone Encounter (Signed)
ATC Almyra Free [facility] Looks like Dr. Lin Landsman prescribed prednisone.

## 2020-07-18 ENCOUNTER — Emergency Department (HOSPITAL_COMMUNITY)
Admission: EM | Admit: 2020-07-18 | Discharge: 2020-07-18 | Disposition: A | Discharge status: Home / Self Care | Attending: Emergency Medicine | Admitting: Emergency Medicine

## 2020-07-18 ENCOUNTER — Other Ambulatory Visit: Payer: Self-pay

## 2020-07-18 ENCOUNTER — Encounter (HOSPITAL_COMMUNITY): Payer: Self-pay

## 2020-07-18 DIAGNOSIS — Z87891 Personal history of nicotine dependence: Secondary | ICD-10-CM | POA: Diagnosis not present

## 2020-07-18 DIAGNOSIS — Z7982 Long term (current) use of aspirin: Secondary | ICD-10-CM | POA: Insufficient documentation

## 2020-07-18 DIAGNOSIS — R55 Syncope and collapse: Secondary | ICD-10-CM | POA: Diagnosis not present

## 2020-07-18 DIAGNOSIS — M549 Dorsalgia, unspecified: Secondary | ICD-10-CM | POA: Insufficient documentation

## 2020-07-18 DIAGNOSIS — I10 Essential (primary) hypertension: Secondary | ICD-10-CM | POA: Insufficient documentation

## 2020-07-18 DIAGNOSIS — E162 Hypoglycemia, unspecified: Secondary | ICD-10-CM | POA: Diagnosis not present

## 2020-07-18 DIAGNOSIS — E161 Other hypoglycemia: Secondary | ICD-10-CM | POA: Diagnosis not present

## 2020-07-18 DIAGNOSIS — Z20822 Contact with and (suspected) exposure to covid-19: Secondary | ICD-10-CM | POA: Insufficient documentation

## 2020-07-18 DIAGNOSIS — J441 Chronic obstructive pulmonary disease with (acute) exacerbation: Secondary | ICD-10-CM | POA: Insufficient documentation

## 2020-07-18 DIAGNOSIS — Z85828 Personal history of other malignant neoplasm of skin: Secondary | ICD-10-CM | POA: Diagnosis not present

## 2020-07-18 DIAGNOSIS — Z79899 Other long term (current) drug therapy: Secondary | ICD-10-CM | POA: Insufficient documentation

## 2020-07-18 DIAGNOSIS — R42 Dizziness and giddiness: Secondary | ICD-10-CM | POA: Diagnosis not present

## 2020-07-18 DIAGNOSIS — M791 Myalgia, unspecified site: Secondary | ICD-10-CM | POA: Insufficient documentation

## 2020-07-18 DIAGNOSIS — I4891 Unspecified atrial fibrillation: Secondary | ICD-10-CM | POA: Diagnosis not present

## 2020-07-18 LAB — BASIC METABOLIC PANEL
Anion gap: 8 (ref 5–15)
BUN: 25 mg/dL — ABNORMAL HIGH (ref 8–23)
CO2: 30 mmol/L (ref 22–32)
Calcium: 9.3 mg/dL (ref 8.9–10.3)
Chloride: 101 mmol/L (ref 98–111)
Creatinine, Ser: 1.02 mg/dL (ref 0.61–1.24)
GFR calc Af Amer: >60 mL/min (ref >60)
GFR calc non Af Amer: >60 mL/min (ref >60)
Glucose, Bld: 101 mg/dL — ABNORMAL HIGH (ref 70–99)
Potassium: 3.3 mmol/L — ABNORMAL LOW (ref 3.5–5.1)
Sodium: 139 mmol/L (ref 135–145)

## 2020-07-18 LAB — CBC
HCT: 41.5 % (ref 39.0–52.0)
Hemoglobin: 13.3 g/dL (ref 13.0–17.0)
MCH: 32.4 pg (ref 26.0–34.0)
MCHC: 32 g/dL (ref 30.0–36.0)
MCV: 101 fL — ABNORMAL HIGH (ref 80.0–100.0)
Platelets: 241 10*3/uL (ref 150–400)
RBC: 4.11 MIL/uL — ABNORMAL LOW (ref 4.22–5.81)
RDW: 13.1 % (ref 11.5–15.5)
WBC: 10.6 10*3/uL — ABNORMAL HIGH (ref 4.0–10.5)
nRBC: 0 % (ref 0.0–0.2)

## 2020-07-18 LAB — TROPONIN I (HIGH SENSITIVITY): Troponin I (High Sensitivity): 8 ng/L (ref <18)

## 2020-07-18 LAB — CBG MONITORING, ED: Glucose-Capillary: 102 mg/dL — ABNORMAL HIGH (ref 70–99)

## 2020-07-18 LAB — SARS CORONAVIRUS 2 BY RT PCR (HOSPITAL ORDER, PERFORMED IN ~~LOC~~ HOSPITAL LAB): SARS Coronavirus 2: NEGATIVE

## 2020-07-18 MED ORDER — DILTIAZEM HCL 25 MG/5ML IV SOLN
10.0000 mg | Freq: Once | INTRAVENOUS | Status: AC
  Administered 2020-07-18: 10 mg via INTRAVENOUS
  Filled 2020-07-18: qty 5

## 2020-07-18 MED ORDER — SODIUM CHLORIDE 0.9 % IV BOLUS
1000.0000 mL | Freq: Once | INTRAVENOUS | Status: AC
  Administered 2020-07-18: 1000 mL via INTRAVENOUS

## 2020-07-18 MED ORDER — DILTIAZEM HCL ER COATED BEADS 180 MG PO CP24
180.0000 mg | ORAL_CAPSULE | Freq: Once | ORAL | Status: AC
  Administered 2020-07-18: 180 mg via ORAL
  Filled 2020-07-18: qty 1

## 2020-07-18 MED ORDER — METOPROLOL TARTRATE 5 MG/5ML IV SOLN
5.0000 mg | Freq: Once | INTRAVENOUS | Status: AC
  Administered 2020-07-18: 5 mg via INTRAVENOUS
  Filled 2020-07-18: qty 5

## 2020-07-18 NOTE — ED Notes (Signed)
Attempted to use urinal. Patient states he will call out when he has to go

## 2020-07-18 NOTE — Progress Notes (Signed)
Hydrologist [facility])  Hospital Liaison: RN note    This is a current active hospice patient with [facility].  [facility] hospital liaisons will follow while patient is ED and if admitted to hospital.    Please call with any hospice related questions or concerns.   Farrel Gordon, RN, CCM  [facility] Liaison (listed on [place] under Hospice[facility])    [phone]

## 2020-07-18 NOTE — ED Notes (Addendum)
Patient states after standing for the blood pressure he became more short of breath, requiring him to sit down immediately. RR 25. HR 120

## 2020-07-18 NOTE — ED Notes (Signed)
NA x 1

## 2020-07-18 NOTE — Discharge Instructions (Addendum)
You had an episode of rapid heart beat called atrial fibrillation.  We treated this with IV medications that improved your heart rate.  Having a fast heart rate can make you feel dizzy, lightheaded, and short of breath.  I gave you your evening dose of diltiazem in the ER today.  This should help keep your heart regular overnight.  If your heart rate speeds up again above 130 (check on your finger pulse ox), and you feel short of breath, call the cardiology number circled above.  They have an on-call doctor who can help guide your treatment and may recommend an extra dose of medications at home.  Tomorrow you should call Dr. Blenda Mounts office to ask for a rapid follow up appointment.  In the meantime, you can resume your normal medications.  If you begin feeling very short of breath again, or have worsening chest pain, or very lightheaded, you should call 911 and return to the ER.

## 2020-07-18 NOTE — ED Provider Notes (Signed)
[facility] EMERGENCY DEPARTMENT Provider Note   CSN: [id number] Arrival date & time: 07/18/20  1310     History Chief Complaint  Patient presents with  . Loss of Consciousness  . Fall    Andrew [name] is a 79 y.o. 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  The patient reports that been feeling "woozy" and lightheaded earlier today.  He was feeling okay this morning when he woke up and yesterday.  His symptoms were particularly notable while sitting on the couch and also when getting up from the couch.  He said he went into the kitchen and attempted to ease himself down into his wheelchair, but misjudged the distance his wheelchair and fell directly onto his bottom on the floor.  He lives with his daughter helped him up off the ground.  She reported that he looked dazed at the time.  He was brought to the ED by EMS.  Currently the emergency department he says he feels okay while sitting on the bed, but tells me he feels like he would become lightheaded if he tried to stand up.  He was also reporting some soreness in his coccyx and lower but where his bottom struck the ground.  He denies any headache.  He denies any nausea or vomiting.  He denies any blood thinner use.  He does not have any chest pain.  He denies any shortness of breath.    He was last seen in emergency department approximately 7 days ago for shortness of breath.  He was treated for COPD exacerbation and also diuresed at that time.  He tells me that since his treatment he feels like his breathing has significantly improved.  HPI     Past Medical History:  Diagnosis Date  . Anemia   . BiPAP (biphasic positive airway pressure) dependence    Pt denies history of OSA  . BPH (benign prostatic hyperplasia)   . Cancer ([place])    skin  . COPD (chronic obstructive pulmonary disease)  ([place])   . Dysphagia   . Emphysema lung ([place])   . GERD (gastroesophageal reflux disease)    " Silent reflux"  . Hearing loss    right ear  . History of hiatal hernia   . HLD (hyperlipidemia)   . Hypertension   . Oxygen dependent    2-3 liters  . Oxygen dependent   . Pneumonia   . Pulmonary hypertension ([place])   . Sleep apnea    wears cpap  . TIA (transient ischemic attack) 04/27/2020    Patient Active Problem List   Diagnosis Date Noted  . Candidal balanitis 06/17/2020  . Dyspnea   . COPD exacerbation ([place]) 05/28/2020  . Palliative care by specialist   . Goals of care, counseling/discussion   . DNR (do not resuscitate)   . TIA (transient ischemic attack) 04/27/2020  . Hospice care patient 03/27/2020  . Palliative care patient 03/27/2020  . Abdominal distention 02/10/2020  . Chronic anticoagulation 09/22/2019  . Healthcare maintenance 07/20/2019  . Immunosuppressed status ([place]) 12/03/2018  . Current chronic use of systemic steroids 12/03/2018  . Iron deficiency anemia, unspecified 04/20/2018  . PVCs (premature ventricular contractions) 02/02/2018  . Influenza-like illness 01/17/2018  . History of pneumonia 12/16/2017  . OSA on CPAP 09/22/2017  . Paroxysmal atrial fibrillation ([facility])   . Malnutrition of moderate degree 09/10/2017  . Protein-calorie malnutrition,  severe 12/09/2016  . Dysphagia 08/23/2016  . Normocytic anemia 06/28/2016  . Pressure sore on buttocks 06/28/2016  . Pulmonary hypertension ([place]) 02/20/2016  . GERD (gastroesophageal reflux disease) 02/20/2016  . COPD with acute exacerbation ([place]) 02/13/2016  . Acute on chronic respiratory failure with hypoxia ([place]) 02/12/2016  . 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 Hypertension 02/11/2016  . Hyperlipidemia 02/11/2016  . Pneumonia of both lower lobes due to infectious organism   . BPH (benign prostatic hyperplasia) 12/29/2015  . Chronic hypoxemic respiratory failure ([place]) 10/02/2015  . History of pulmonary  hypertension 10/02/2015    Past Surgical History:  Procedure Laterality Date  . CARDIAC CATHETERIZATION    . CATARACT EXTRACTION W/ INTRAOCULAR LENS  IMPLANT, BILATERAL    . COLONOSCOPY WITH PROPOFOL N/A 04/20/2018   Procedure: COLONOSCOPY WITH PROPOFOL;  Surgeon: Wilford Corner, MD;  Location: [facility];  Service: Endoscopy;  Laterality: N/A;  . ESOPHAGOGASTRODUODENOSCOPY (EGD) WITH PROPOFOL N/A 08/23/2016   Procedure: ESOPHAGOGASTRODUODENOSCOPY (EGD) WITH PROPOFOL;  Surgeon: Wilford Corner, MD;  Location: [facility] ENDOSCOPY;  Service: Endoscopy;  Laterality: N/A;  . ESOPHAGOGASTRODUODENOSCOPY (EGD) WITH PROPOFOL N/A 04/20/2018   Procedure: ESOPHAGOGASTRODUODENOSCOPY (EGD) WITH PROPOFOL;  Surgeon: Wilford Corner, MD;  Location: [facility];  Service: Endoscopy;  Laterality: N/A;  . HOT HEMOSTASIS N/A 04/20/2018   Procedure: HOT HEMOSTASIS (ARGON PLASMA COAGULATION/BICAP);  Surgeon: Wilford Corner, MD;  Location: [facility];  Service: Endoscopy;  Laterality: N/A;  . LUNG SURGERY    . POLYPECTOMY  04/20/2018   Procedure: POLYPECTOMY;  Surgeon: Wilford Corner, MD;  Location: [facility] ENDOSCOPY;  Service: Endoscopy;;  . TONSILLECTOMY         Family History  Problem Relation Age of Onset  . Cancer Maternal Uncle   . Pancreatitis Mother   . Bone cancer Brother   . Stroke Maternal Grandfather   . Congestive Heart Failure Maternal Grandfather     Social History   Tobacco Use  . Smoking status: Former Smoker    Packs/day: 0.00    Years: 0.00    Pack years: 0.00    Types: Cigarettes    Quit date: 10/01/1984    Years since quitting: 35.8  . Smokeless tobacco: Never Used  Vaping Use  . Vaping Use: Never used  Substance Use Topics  . Alcohol use: No    Alcohol/week: 0.0 standard drinks  . Drug use: No    Home Medications Prior to Admission medications   Medication Sig Start Date End Date Taking? Authorizing Provider  albuterol (PROVENTIL HFA;VENTOLIN HFA) 108 (90 Base)  MCG/ACT inhaler Inhale 2 puffs into the lungs every 6 (six) hours as needed for wheezing or shortness of breath. 04/17/17   Noralee Space, MD  Ascorbic Acid (VITAMIN C) 1000 MG tablet Take 1,000 mg by mouth at bedtime.    [provider]  aspirin EC 81 MG tablet Take 1 tablet (81 mg total) by mouth daily. 07/14/20   Garner Nash, DO  Cholecalciferol (VITAMIN D) 50 MCG (2000 UT) tablet Take 2,000 Units by mouth at bedtime.    [provider]  diltiazem (CARDIZEM CD) 180 MG 24 hr capsule TAKE 1 CAPSULE EVERY DAY (NEEDS OFFICE VISIT FOR FURTHER REFILLS) Patient taking differently: Take 180 mg by mouth at bedtime.  02/15/20   Skeet Latch, MD  finasteride (PROSCAR) 5 MG tablet Take 5 mg by mouth 2 (two) times daily.     [provider]  furosemide (LASIX) 40 MG tablet Take 40 mg by mouth every Monday,  Wednesday, and Friday.     [provider]  guaiFENesin (MUCINEX) 600 MG 12 hr tablet Take 1 tablet (600 mg total) by mouth 2 (two) times daily. 09/12/17   Lavina Hamman, MD  ipratropium-albuterol (DUONEB) 0.5-2.5 (3) MG/3ML SOLN Take 3 mLs by nebulization every 6 (six) hours as needed. Patient taking differently: Take 3 mLs by nebulization 3 (three) times daily.  03/15/20   Icard, Octavio Graves, DO  LORazepam (ATIVAN) 0.5 MG tablet Take 0.5 mg by mouth every 4 (four) hours as needed for anxiety.  05/25/20   [provider]  Melatonin 10 MG CAPS Take 1 capsule by mouth at bedtime. 06/13/20   [provider]  metoCLOPramide (REGLAN) 10 MG tablet Take 10 mg by mouth 3 (three) times daily before meals. 05/22/20   [provider]  morphine (MSIR) 15 MG tablet Take 15 mg by mouth every 4 (four) hours as needed (pain).     [provider]  omeprazole (PRILOSEC) 40 MG capsule Take 40 mg by mouth 2 (two) times daily.     [provider]  OVER THE COUNTER MEDICATION Take 1 capsule by mouth at bedtime. CBD oil    [provider]    OXYGEN Inhale 3 L into the lungs continuous.     [provider]  polyethylene glycol (MIRALAX / GLYCOLAX) packet Take 17 g by mouth daily. Patient taking differently: Take 17 g by mouth at bedtime. Mix in 8 oz liquid and drink 05/08/16   Mesner, Corene Cornea, MD  potassium chloride 20 MEQ TBCR Take 20 mEq by mouth daily. Patient taking differently: Take 20 mEq by mouth every Monday, Wednesday, and Friday.  04/05/20   [facility], Tami Lin, PA  predniSONE (DELTASONE) 10 MG tablet Take 3 tablets for 3 days then resume home dose of 10 mg. 06/20/20   Regalado, Jerald Kief A, MD  prochlorperazine (COMPAZINE) 10 MG tablet Take 10 mg by mouth every 4 (four) hours as needed for nausea or vomiting.  05/15/20   [provider]  rosuvastatin (CRESTOR) 10 MG tablet Take 10 mg by mouth at bedtime.     [provider]  senna (SENOKOT) 8.6 MG TABS tablet Take 2 tablets by mouth 2 (two) times daily. 05/17/20   [provider]  sildenafil (REVATIO) 20 MG tablet Take 1 tablet (20 mg total) by mouth 2 (two) times daily. 03/12/19   Fenton Foy, NP  Spacer/Aero-Holding Chambers (AEROCHAMBER MV) inhaler Use as instructed 12/03/18   Icard, Octavio Graves, DO  tamsulosin (FLOMAX) 0.4 MG CAPS capsule Take 1 capsule (0.4 mg total) by mouth daily after breakfast. 01/03/16   Nita Sells, MD  temazepam (RESTORIL) 30 MG capsule Take 1 capsule (30 mg total) by mouth at bedtime. 07/09/18   Noralee Space, MD    Allergies    Patient has no known allergies.  Review of Systems   Review of Systems  Constitutional: Negative for chills and fever.  HENT: Negative for ear pain and sore throat.   Eyes: Negative for pain and visual disturbance.  Respiratory: Negative for cough and shortness of breath.   Cardiovascular: Negative for chest pain and palpitations.  Gastrointestinal: Negative for abdominal pain and vomiting.  Genitourinary: Negative for dysuria and hematuria.  Musculoskeletal: Positive for back  pain and myalgias. Negative for arthralgias.  Skin: Negative for color change and rash.  Neurological: Positive for dizziness and light-headedness. Negative for seizures, syncope, facial asymmetry and headaches.  Psychiatric/Behavioral: Negative for agitation and  confusion.  All other systems reviewed and are negative.   Physical Exam Updated Vital Signs BP 121/79   Pulse 82   Temp 98.8 F (37.1 C) (Oral)   Resp 19   Ht 5\' 7"  (1.702 m)   Wt 59 kg   SpO2 97%   BMI 20.36 kg/m   Physical Exam Vitals and nursing note reviewed.  Constitutional:      Appearance: He is well-developed.  HENT:     Head: Normocephalic and atraumatic.  Eyes:     Conjunctiva/sclera: Conjunctivae normal.  Cardiovascular:     Rate and Rhythm: Normal rate. Rhythm irregular.  Pulmonary:     Effort: Pulmonary effort is normal. No respiratory distress.     Comments: 3L  baseline, 97% O2 Abdominal:     General: There is no distension.     Palpations: Abdomen is soft.     Tenderness: There is no abdominal tenderness.  Musculoskeletal:     Cervical back: Neck supple.     Comments: Full ROM of the extremities and the hips without pain No pelvic ttp No spinal midline or coccyx ttp on exam  Skin:    General: Skin is warm and dry.  Neurological:     General: No focal deficit present.     Mental Status: He is alert and oriented to person, place, and time.     Cranial Nerves: No cranial nerve deficit.     Sensory: No sensory deficit.     Motor: No weakness.  Psychiatric:        Mood and Affect: Mood normal.        Behavior: Behavior normal.     ED Results / Procedures / Treatments   Labs (all labs ordered are listed, but only abnormal results are displayed) Labs Reviewed  BASIC METABOLIC PANEL - Abnormal; Notable for the following components:      Result Value   Potassium 3.3 (*)    Glucose, Bld 101 (*)    BUN 25 (*)    All other components within normal limits  CBC - Abnormal; Notable for  the following components:   WBC 10.6 (*)    RBC 4.11 (*)    MCV 101.0 (*)    All other components within normal limits  CBG MONITORING, ED - Abnormal; Notable for the following components:   Glucose-Capillary 102 (*)    All other components within normal limits  SARS CORONAVIRUS 2 BY RT PCR (HOSPITAL ORDER, [facility])  URINALYSIS, ROUTINE W REFLEX MICROSCOPIC  CBG MONITORING, ED  TROPONIN I (HIGH SENSITIVITY)    EKG EKG Interpretation  Date/Time:  Tuesday July 18 2020 20:26:56 EDT Ventricular Rate:  142 PR Interval:  132 QRS Duration: 112 QT Interval:  290 QTC Calculation: 446 R Axis:   55 Text Interpretation: Atrial fibrillation with rapid V-rate Paired ventricular premature complexes Incomplete right bundle branch block ST depression, probably rate related Artifact in lead(s) I III aVR aVL V4 V5 V6 Confirmed by Octaviano Glow [phone]) on 07/18/2020 9:12:37 PM   Radiology No results found.  Procedures .Critical Care Performed by: Wyvonnia Dusky, MD Authorized by: Wyvonnia Dusky, MD   Critical care provider statement:    Critical care time (minutes):  45   Critical care was necessary to treat or prevent imminent or life-threatening deterioration of the following conditions:  Circulatory failure   Critical care was time spent personally by me on the following activities:  Discussions with consultants, evaluation of  patient's response to treatment, examination of patient, ordering and performing treatments and interventions, ordering and review of laboratory studies, ordering and review of radiographic studies, pulse oximetry, re-evaluation of patient's condition, obtaining history from patient or surrogate and review of old charts Comments:     A Fib with RVR requiring multiple ECG's, multiple IV rate control medications   (including critical care time)  Medications Ordered in ED Medications  sodium chloride 0.9 % bolus 1,000 mL (0 mLs  Intravenous Stopped 07/18/20 2044)  diltiazem (CARDIZEM) injection 10 mg (10 mg Intravenous Given 07/18/20 2043)  metoprolol tartrate (LOPRESSOR) injection 5 mg (5 mg Intravenous Given 07/18/20 2134)  diltiazem (CARDIZEM CD) 24 hr capsule 180 mg (180 mg Oral Given 07/18/20 2308)    ED Course  I have reviewed the triage vital signs and the nursing notes.  Pertinent labs & imaging results that were available during my care of the patient were reviewed by me and considered in my medical decision making (see chart for details).  This is an 79 year old male with a history of severe COPD and emphysema on home hospice presenting to the ED with episode of lightheadedness as well as mechanical fall.  Differential includes orthostatic hypotension versus symptomatic anemia versus atypical ACS versus other  His blood sugar is within normal limits on arrival.  Do not suspect this was hypoglycemia.  His BMP is notable only for very mild hypokalemia with potassium of 3.3.  Otherwise this looks normal including his kidney function.  His CBC shows a white blood cell count of 10.6, which is not that significant in the setting of his recent prednisone taper for COPD.  His hemoglobin is within normal limits at 13.3.  We are awaiting his troponin level.  His EKG shows some borderline sinus tachycardia with occasional PACs.  I have also asked the nurse orthostatic vital signs and to initiate 1 L IV fluid bolus.  He has no evidence of significant trauma on exam, including no midline spinal ttp or coccyx ttp.  I do not believe he requires emergent CT imaging of the spine at this time.  Have a low suspicion for pelvic fracture.  He does not appear to have any focal bony tenderness on pelvic exam and has good range of motion of the lower extremities.  The patient has expressed a strong desire to go home after his work-up today, and given that he is on hospice I will make every effort to do so.  Clinical Course as of Jul 19 2323  Tue Jul 18, 2020  2033 I stood up the patient and his HR increased to 150, he feels SOB.  BP stable.  ECG appears to show A Fib vs MAT.   [MT]  2036 Per his last cardiology evaluation he has a hx of parox A Fib during his last hospitalization and is being treated with diltizem and eliquis at home.  We'll give 10 mg IV diltiazem here.   [MT]  2136 No response to diltiazem after 40 minutes, we will try 5 mg IV metoprolol which has been given in the past in the hospital.  He has no active signs or symptoms of a COPD exacerbation at this time - I think a single dose of beta blocker should be safe to give here   [MT]  2212 HR improved to 80 bpm, he is feeling better, informed we will observe in the ED a while longer.  I would not initiate beta blocker at home given his severe  emphysema   [MT]  2321 On reassessment the patient remains with normal heart rate and asymptomatic.  I did have a discussion with him as well as his daughter by phone about possible recurrence of his A. fib.  We will give him a dose of his evening diltiazem here before he leaves.  He was also instructed he can call the cardiology number tonight if he needs to speak to the on-call physician.  He has a pulse ox at home and can monitor his heart rate.  He lives with his daughter will be helping keep an eye on him.   [MT]    Clinical Course User Index [MT] Euna Armon, Carola Rhine, MD   Final Clinical Impression(s) / ED Diagnoses Final diagnoses:  Atrial fibrillation, unspecified type [facility])  Lightheadedness    Rx / DC Orders ED Discharge Orders    None       Langston Masker Carola Rhine, MD 07/18/20 2324

## 2020-07-18 NOTE — ED Triage Notes (Signed)
Pt from home with ems for fall while transferring from bed to [facility], pt missed the WC and fell bottom first onto floor, no injuries from fall but as daughter was helping pt up he had a "dazed look on his face". Pt a.o at this time, no complaints, VSS

## 2020-07-19 DIAGNOSIS — G4733 Obstructive sleep apnea (adult) (pediatric): Secondary | ICD-10-CM | POA: Diagnosis not present

## 2020-07-19 DIAGNOSIS — D649 Anemia, unspecified: Secondary | ICD-10-CM | POA: Diagnosis not present

## 2020-07-19 DIAGNOSIS — N400 Enlarged prostate without lower urinary tract symptoms: Secondary | ICD-10-CM | POA: Diagnosis not present

## 2020-07-19 DIAGNOSIS — N3281 Overactive bladder: Secondary | ICD-10-CM | POA: Diagnosis not present

## 2020-07-19 DIAGNOSIS — L309 Dermatitis, unspecified: Secondary | ICD-10-CM | POA: Diagnosis not present

## 2020-07-19 DIAGNOSIS — K219 Gastro-esophageal reflux disease without esophagitis: Secondary | ICD-10-CM | POA: Diagnosis not present

## 2020-07-19 DIAGNOSIS — I4891 Unspecified atrial fibrillation: Secondary | ICD-10-CM | POA: Diagnosis not present

## 2020-07-19 DIAGNOSIS — J9611 Chronic respiratory failure with hypoxia: Secondary | ICD-10-CM | POA: Diagnosis not present

## 2020-07-19 DIAGNOSIS — K449 Diaphragmatic hernia without obstruction or gangrene: Secondary | ICD-10-CM | POA: Diagnosis not present

## 2020-07-19 DIAGNOSIS — Z6822 Body mass index (BMI) 22.0-22.9, adult: Secondary | ICD-10-CM | POA: Diagnosis not present

## 2020-07-19 DIAGNOSIS — Z87891 Personal history of nicotine dependence: Secondary | ICD-10-CM | POA: Diagnosis not present

## 2020-07-19 DIAGNOSIS — I272 Pulmonary hypertension, unspecified: Secondary | ICD-10-CM | POA: Diagnosis not present

## 2020-07-19 DIAGNOSIS — I10 Essential (primary) hypertension: Secondary | ICD-10-CM | POA: Diagnosis not present

## 2020-07-19 DIAGNOSIS — Z9981 Dependence on supplemental oxygen: Secondary | ICD-10-CM | POA: Diagnosis not present

## 2020-07-19 DIAGNOSIS — E785 Hyperlipidemia, unspecified: Secondary | ICD-10-CM | POA: Diagnosis not present

## 2020-07-19 DIAGNOSIS — J432 Centrilobular emphysema: Secondary | ICD-10-CM | POA: Diagnosis not present

## 2020-07-20 DIAGNOSIS — G4733 Obstructive sleep apnea (adult) (pediatric): Secondary | ICD-10-CM | POA: Diagnosis not present

## 2020-07-20 DIAGNOSIS — I4891 Unspecified atrial fibrillation: Secondary | ICD-10-CM | POA: Diagnosis not present

## 2020-07-20 DIAGNOSIS — J9611 Chronic respiratory failure with hypoxia: Secondary | ICD-10-CM | POA: Diagnosis not present

## 2020-07-20 DIAGNOSIS — I10 Essential (primary) hypertension: Secondary | ICD-10-CM | POA: Diagnosis not present

## 2020-07-20 DIAGNOSIS — J432 Centrilobular emphysema: Secondary | ICD-10-CM | POA: Diagnosis not present

## 2020-07-20 DIAGNOSIS — I272 Pulmonary hypertension, unspecified: Secondary | ICD-10-CM | POA: Diagnosis not present

## 2020-07-20 IMAGING — DX DG CHEST 1V PORT
2 series · 2 of 2 positions shown · non-contrast
Comparison: 12/12/2018.

CLINICAL DATA: Shortness of breath. Ex-smoker. Cough.

EXAM:
PORTABLE CHEST 1 VIEW

[chest ap (1 of 2)]
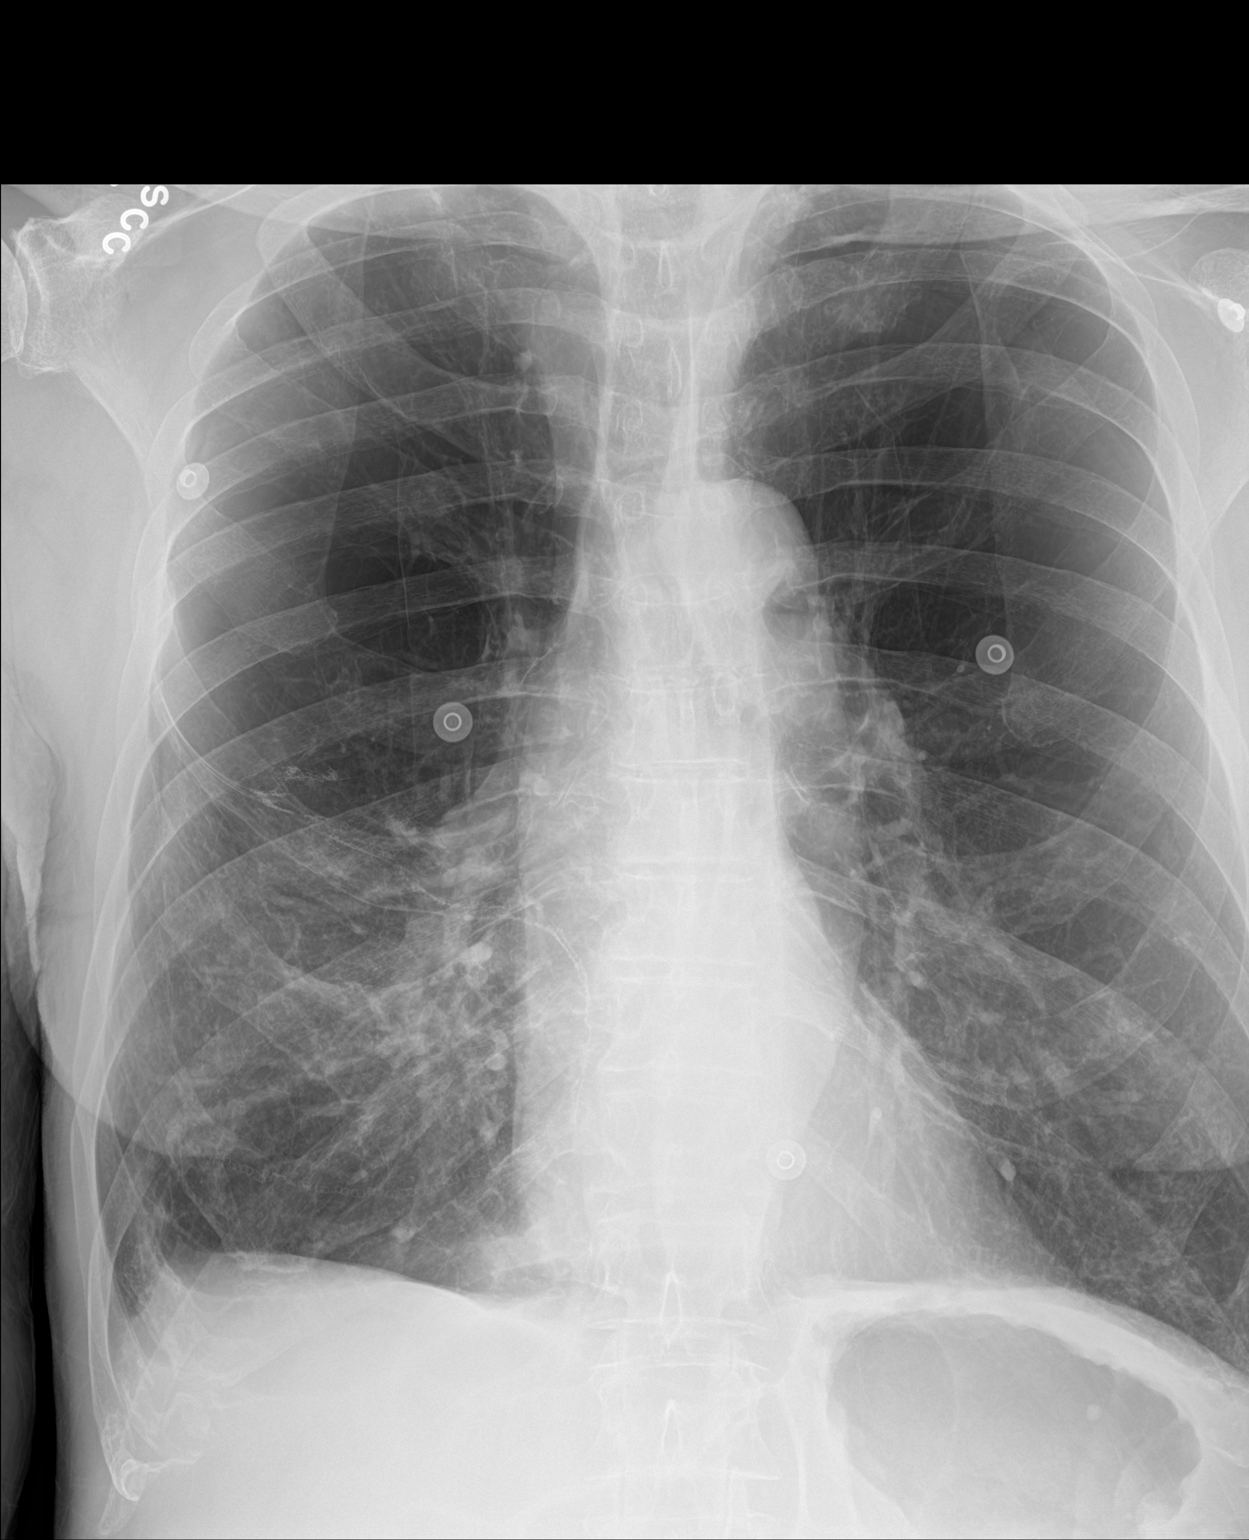

[chest ap (2 of 2)]
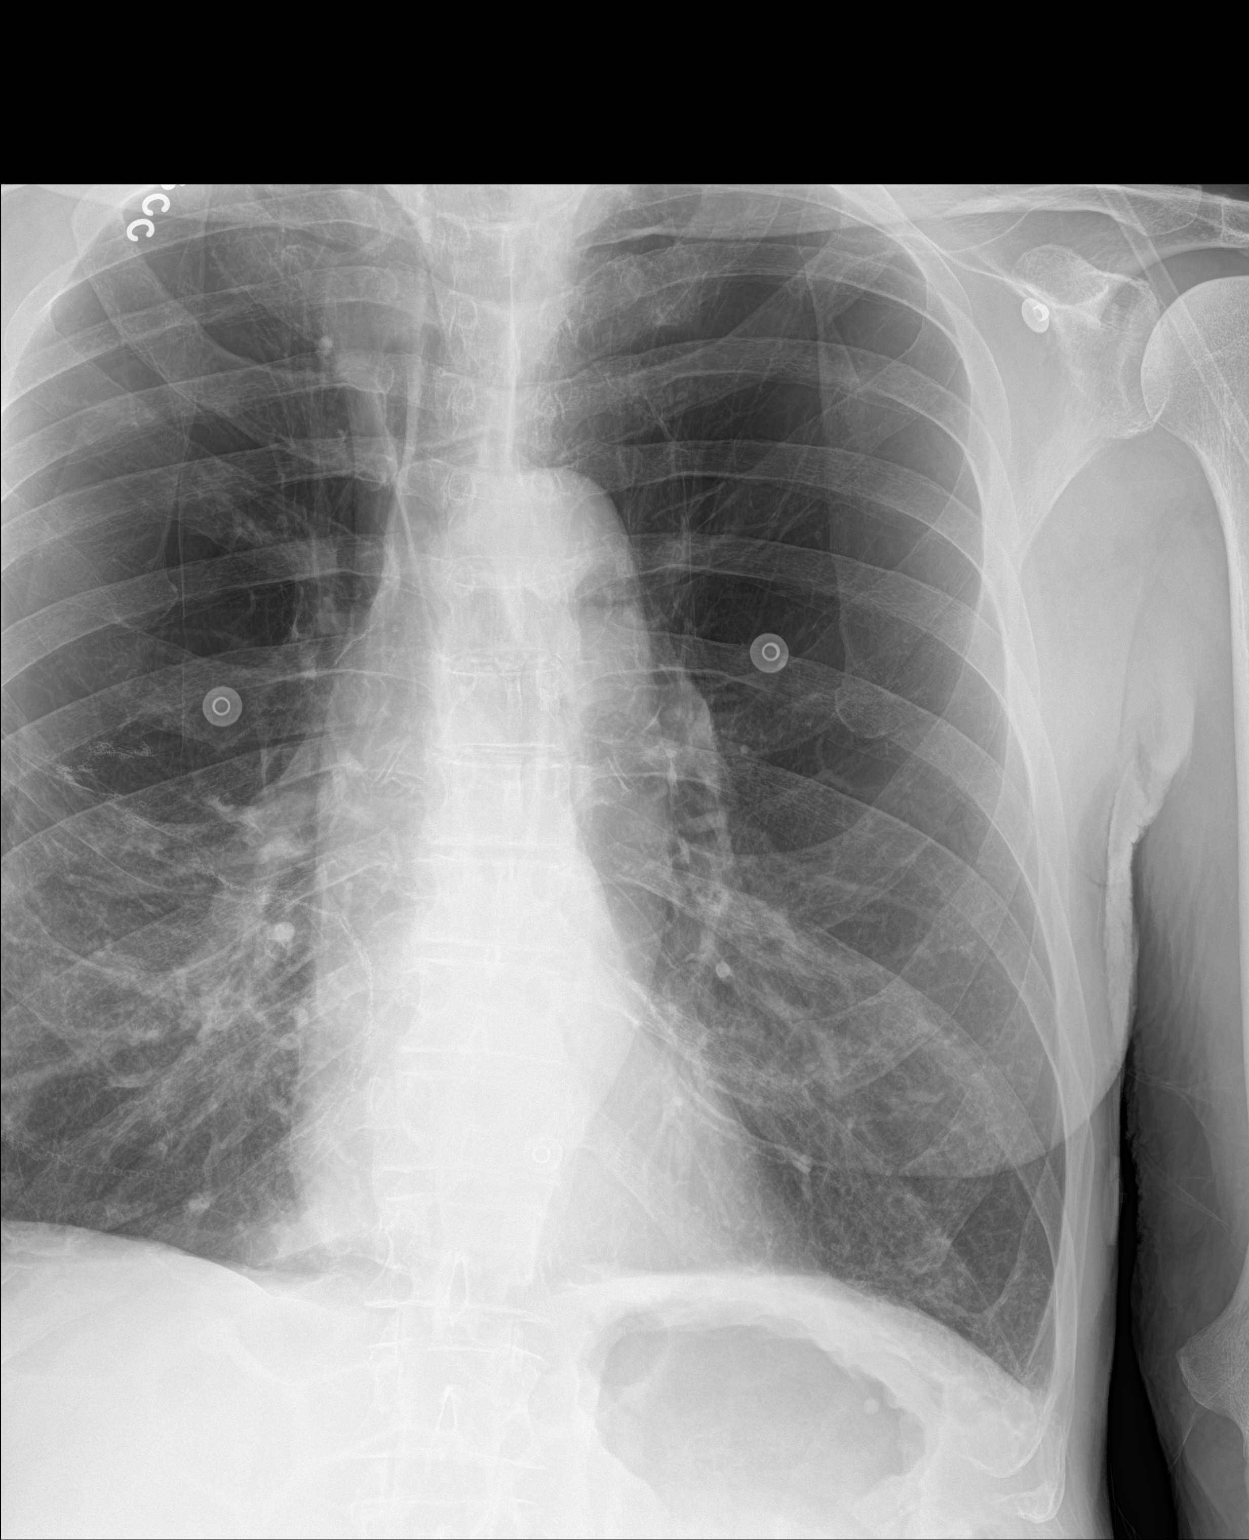

[2 of 2 positions shown; findings below may reference images not displayed]

FINDINGS: Normal sized heart. Tortuous aorta. Stable hyperexpansion of the
lungs with extensive bullous changes. Surgical staple lines are
again demonstrated on the right. No acute bony abnormality.
IMPRESSION: No acute abnormality. Stable marked changes of COPD.

## 2020-07-20 NOTE — Progress Notes (Signed)
Cardiology Office Note   Date:  07/21/2020   ID:  Andrew [name], DOB 03-Aug-1941, MRN [id number]  PCP:  Lujean Amel, MD  Cardiologist:  Dr.[place]  No chief complaint on file.    History of Present Illness: Andrew [name] is a 79 y.o. male who presents for ongoing assessment and management of atrial fibrillation.  The patient presented to the ED on 07/18/2020 with lightheadedness and mechanical fall.  The patient then sitting on his couch and when he went into the kitchen and attempted to ease himself down into his wheelchair he misjudged the distance to his wheelchair, and fell directly on his bottom onto the floor.  He lives with his daughter and she helped him off the ground.  But because of his dazed appearance, he was brought to the ED.  EKG revealed atrial fibrillation with rapid ventricular rate.  Heart rate of 142 bpm.  He was treated with diltiazem IV, metoprolol IV, and started on diltiazem 180 mg daily (24-hour capsule).  Of note the patient also has a history of severe COPD and emphysema and is on home hospice.  The patient was noted not to be orthostatic, anemic, nor did his blood sugar revealing hypoglycemia.  It was noted that his potassium was 3.3.  He did return home on 07/18/2020 after full evaluation and is here for follow-up.   He comes today with his daughter, he is frail sitting in wheelchair wearing oxygen.  He states that he is not had any further complaints of dizziness, but his daughter states that he continues to be unstable on his feet.  He is unable to tell whether he is in atrial fib or not.  However, when his heart rate is really elevated he does notice that his breathing status is more difficult despite wearing oxygen.  His oxygen is now increased to 3 L per nasal cannula.  He admits to not eating or drinking very much lately but his appetite is returning some.  He had not been eating or drinking very much prior to the episode, and has been taking his Lasix 3 times a  week as directed.   Past Medical History:  Diagnosis Date  . Anemia   . BiPAP (biphasic positive airway pressure) dependence    Pt denies history of OSA  . BPH (benign prostatic hyperplasia)   . Cancer ([place])    skin  . COPD (chronic obstructive pulmonary disease) ([place])   . Dysphagia   . Emphysema lung ([place])   . GERD (gastroesophageal reflux disease)    " Silent reflux"  . Hearing loss    right ear  . History of hiatal hernia   . HLD (hyperlipidemia)   . Hypertension   . Oxygen dependent    2-3 liters  . Oxygen dependent   . Pneumonia   . Pulmonary hypertension ([place])   . Sleep apnea    wears cpap  . TIA (transient ischemic attack) 04/27/2020    Past Surgical History:  Procedure Laterality Date  . CARDIAC CATHETERIZATION    . CATARACT EXTRACTION W/ INTRAOCULAR LENS  IMPLANT, BILATERAL    . COLONOSCOPY WITH PROPOFOL N/A 04/20/2018   Procedure: COLONOSCOPY WITH PROPOFOL;  Surgeon: Wilford Corner, MD;  Location: [facility];  Service: Endoscopy;  Laterality: N/A;  . ESOPHAGOGASTRODUODENOSCOPY (EGD) WITH PROPOFOL N/A 08/23/2016   Procedure: ESOPHAGOGASTRODUODENOSCOPY (EGD) WITH PROPOFOL;  Surgeon: Wilford Corner, MD;  Location: [facility] ENDOSCOPY;  Service: Endoscopy;  Laterality: N/A;  . ESOPHAGOGASTRODUODENOSCOPY (EGD) WITH PROPOFOL N/A 04/20/2018  Procedure: ESOPHAGOGASTRODUODENOSCOPY (EGD) WITH PROPOFOL;  Surgeon: Wilford Corner, MD;  Location: [place];  Service: Endoscopy;  Laterality: N/A;  . HOT HEMOSTASIS N/A 04/20/2018   Procedure: HOT HEMOSTASIS (ARGON PLASMA COAGULATION/BICAP);  Surgeon: Wilford Corner, MD;  Location: [place];  Service: Endoscopy;  Laterality: N/A;  . LUNG SURGERY    . POLYPECTOMY  04/20/2018   Procedure: POLYPECTOMY;  Surgeon: Wilford Corner, MD;  Location: [facility] ENDOSCOPY;  Service: Endoscopy;;  . TONSILLECTOMY       Current Outpatient Medications  Medication Sig Dispense Refill  . albuterol (PROVENTIL HFA;VENTOLIN HFA) 108 (90 Base)  MCG/ACT inhaler Inhale 2 puffs into the lungs every 6 (six) hours as needed for wheezing or shortness of breath. 1 Inhaler 11  . Ascorbic Acid (VITAMIN C) 1000 MG tablet Take 1,000 mg by mouth at bedtime.    Marland Kitchen aspirin EC 81 MG tablet Take 1 tablet (81 mg total) by mouth daily. 30 tablet 3  . Cholecalciferol (VITAMIN D) 50 MCG (2000 UT) tablet Take 2,000 Units by mouth at bedtime.    Marland Kitchen diltiazem (CARDIZEM CD) 180 MG 24 hr capsule TAKE 1 CAPSULE EVERY DAY (NEEDS OFFICE VISIT FOR FURTHER REFILLS) (Patient taking differently: Take 180 mg by mouth at bedtime. ) 60 capsule 3  . finasteride (PROSCAR) 5 MG tablet Take 5 mg by mouth 2 (two) times daily.     . furosemide (LASIX) 20 MG tablet Take 1 tablet (20 mg total) by mouth every Monday, Wednesday, and Friday. 20 tablet 2  . guaiFENesin (MUCINEX) 600 MG 12 hr tablet Take 1 tablet (600 mg total) by mouth 2 (two) times daily. 30 tablet 0  . ipratropium-albuterol (DUONEB) 0.5-2.5 (3) MG/3ML SOLN Take 3 mLs by nebulization every 6 (six) hours as needed. (Patient taking differently: Take 3 mLs by nebulization 3 (three) times daily. ) 360 mL 3  . LORazepam (ATIVAN) 0.5 MG tablet Take 0.5 mg by mouth every 4 (four) hours as needed for anxiety.     . Melatonin 10 MG CAPS Take 1 capsule by mouth at bedtime.    . metoCLOPramide (REGLAN) 10 MG tablet Take 10 mg by mouth 3 (three) times daily before meals.    Marland Kitchen morphine (MSIR) 15 MG tablet Take 15 mg by mouth every 4 (four) hours as needed (pain).     Marland Kitchen omeprazole (PRILOSEC) 40 MG capsule Take 40 mg by mouth 2 (two) times daily.     Marland Kitchen OVER THE COUNTER MEDICATION Take 1 capsule by mouth at bedtime. CBD oil    . OXYGEN Inhale 3 L into the lungs continuous.     . polyethylene glycol (MIRALAX / GLYCOLAX) packet Take 17 g by mouth daily. (Patient taking differently: Take 17 g by mouth at bedtime. Mix in 8 oz liquid and drink) 14 each 1  . potassium chloride 20 MEQ TBCR Take 20 mEq by mouth daily. (Patient taking  differently: Take 20 mEq by mouth every Monday, Wednesday, and Friday. ) 30 tablet 6  . predniSONE (DELTASONE) 10 MG tablet Take 3 tablets for 3 days then resume home dose of 10 mg. 30 tablet 0  . prochlorperazine (COMPAZINE) 10 MG tablet Take 10 mg by mouth every 4 (four) hours as needed for nausea or vomiting.     . rosuvastatin (CRESTOR) 10 MG tablet Take 10 mg by mouth at bedtime.     . senna (SENOKOT) 8.6 MG TABS tablet Take 2 tablets by mouth 2 (two) times daily.    . sildenafil (REVATIO)  20 MG tablet Take 1 tablet (20 mg total) by mouth 2 (two) times daily. 180 tablet 3  . Spacer/Aero-Holding Chambers (AEROCHAMBER MV) inhaler Use as instructed 1 each 0  . tamsulosin (FLOMAX) 0.4 MG CAPS capsule Take 1 capsule (0.4 mg total) by mouth daily after breakfast. 30 capsule 0  . temazepam (RESTORIL) 30 MG capsule Take 1 capsule (30 mg total) by mouth at bedtime. 30 capsule 5   No current facility-administered medications for this visit.    Allergies:   Patient has no known allergies.    Social History:  The patient  reports that he quit smoking about 35 years ago. His smoking use included cigarettes. He smoked 0.00 packs per day for 0.00 years. He has never used smokeless tobacco. He reports that he does not drink alcohol and does not use drugs.   Family History:  The patient's family history includes Bone cancer in his brother; Cancer in his maternal uncle; Congestive Heart Failure in his maternal grandfather; Pancreatitis in his mother; Stroke in his maternal grandfather.    ROS: All other systems are reviewed and negative. Unless otherwise mentioned in H&P    PHYSICAL EXAM: VS:  BP 126/60   Pulse 84   Ht 5\' 7"  (1.702 m)   Wt 130 lb (59 kg)   SpO2 97%   BMI 20.36 kg/m  , BMI Body mass index is 20.36 kg/m. GEN: Well nourished, well developed, in no acute distress, frail HEENT: normal Neck: no JVD, carotid bruits, or masses Cardiac: Distant heart sounds, irregular RRR; no murmurs,  rubs, or gallops, mild dependent edema  Respiratory:  Clear to auscultation bilaterally, normal work of breathing, wearing oxygen via nasal cannula attached to concentrator. GI: soft, nontender, nondistended, + BS MS: no deformity or atrophy Skin: warm and dry, no rash, pale Neuro:  Strength diminished, and sensation are intact Psych: euthymic mood, full affect   EKG:    Not completed this office visit  Recent Labs: 06/18/2020: ALT 20; Magnesium 2.6 07/10/2020: B Natriuretic Peptide 112.7 07/18/2020: BUN 25; Creatinine, Ser 1.02; Hemoglobin 13.3; Platelets 241; Potassium 3.3; Sodium 139    Lipid Panel No results found for: CHOL, TRIG, HDL, CHOLHDL, VLDL, LDLCALC, LDLDIRECT    Wt Readings from Last 3 Encounters:  07/21/20 130 lb (59 kg)  07/18/20 130 lb (59 kg)  07/07/20 130 lb (59 kg)      Other studies Reviewed: Echocardiogram 09-29-17 Left ventricle: The cavity size was normal. Wall thickness was  normal. Systolic function was normal. The estimated ejection  fraction was in the range of 55% to 60%. Wall motion was normal;  there were no regional wall motion abnormalities. The study is  not technically sufficient to allow evaluation of LV diastolic  function.  - Pulmonary arteries: PA peak pressure: 33 mm Hg (S).  - Pericardium, extracardiac: A trivial pericardial effusion was  identified.    ASSESSMENT AND PLAN:  1.  Paroxysmal atrial fibrillation: He remains on diltiazem 180 mg, aspirin 81 mg.  He was not found to be anemic in ED and has not noted any bleeding, hemoptysis, or epistaxis.  Heart rate is controlled today.  2.  Dehydration: We will decrease his Lasix to 20 mg 3 times a week from 40 mg 3 times a week.  He does have a high likelihood of CHF in the setting of atrial fibrillation, however his blood pressure is low normal, and he is not eating and drinking as well lately.  He states when he  fell he did also lose consciousness briefly when he landed on  the floor.  I worry that he may have issues with dehydration at a higher dose of Lasix.  It they are finding that he is beginning to retain fluid and having worsening breathing issues, he will go back up to the 40 mg 3 times a week.  We will see him in 1 month on follow-up.  He wishes a televisit.  3.  End-stage COPD: Remains on oxygen via nasal cannula at 3 L this is an increase in oxygen level compared to a few months ago where he was at 2 L.  He is being followed by pulmonary and is in hospice.  4.  Hyperlipidemia: Remains on Crestor 10 mg daily.    Current medicines are reviewed at length with the patient today.  I have spent 25 minuteas dedicated to the care of this patient on the date of this encounter to include pre-visit review of records, assessment, management and diagnostic testing,with shared decision making.  Labs/ tests ordered today include: None-reviewed ED visit labs.  Phill Myron. West Pugh, ANP, AACC   07/21/2020 2:24 PM    [facility] [address] Office [phone] Fax [phone]  Notice: This dictation was prepared with Dragon dictation along with smaller phrase technology. Any transcriptional errors that result from this process are unintentional and may not be corrected upon review.

## 2020-07-21 ENCOUNTER — Encounter: Payer: Self-pay | Admitting: Adult Health

## 2020-07-21 ENCOUNTER — Ambulatory Visit (INDEPENDENT_AMBULATORY_CARE_PROVIDER_SITE_OTHER): Payer: Medicare Other | Admitting: Adult Health

## 2020-07-21 ENCOUNTER — Other Ambulatory Visit: Payer: Self-pay

## 2020-07-21 VITALS — BP 126/60 | HR 84 | Ht 67.0 in | Wt 130.0 lb

## 2020-07-21 DIAGNOSIS — I272 Pulmonary hypertension, unspecified: Secondary | ICD-10-CM | POA: Diagnosis not present

## 2020-07-21 DIAGNOSIS — Z515 Encounter for palliative care: Secondary | ICD-10-CM | POA: Diagnosis not present

## 2020-07-21 DIAGNOSIS — J449 Chronic obstructive pulmonary disease, unspecified: Secondary | ICD-10-CM

## 2020-07-21 DIAGNOSIS — I480 Paroxysmal atrial fibrillation: Secondary | ICD-10-CM | POA: Diagnosis not present

## 2020-07-21 DIAGNOSIS — G4733 Obstructive sleep apnea (adult) (pediatric): Secondary | ICD-10-CM

## 2020-07-21 DIAGNOSIS — Z9989 Dependence on other enabling machines and devices: Secondary | ICD-10-CM | POA: Diagnosis not present

## 2020-07-21 DIAGNOSIS — I10 Essential (primary) hypertension: Secondary | ICD-10-CM | POA: Diagnosis not present

## 2020-07-21 DIAGNOSIS — J9611 Chronic respiratory failure with hypoxia: Secondary | ICD-10-CM | POA: Diagnosis not present

## 2020-07-21 DIAGNOSIS — I4891 Unspecified atrial fibrillation: Secondary | ICD-10-CM | POA: Diagnosis not present

## 2020-07-21 DIAGNOSIS — J432 Centrilobular emphysema: Secondary | ICD-10-CM | POA: Diagnosis not present

## 2020-07-21 MED ORDER — FUROSEMIDE 20 MG PO TABS: 20.0000 mg | ORAL_TABLET | ORAL | 2 refills | Status: AC

## 2020-07-21 NOTE — Patient Instructions (Signed)
Medication Instructions:  DECREASE the Furosemide to 20 mg three times a week.  *If you need a refill on your cardiac medications before your next appointment, please call your pharmacy*   Lab Work: None ordered If you have labs (blood work) drawn today and your tests are completely normal, you will receive your results only by:  [place] (if you have MyChart) OR  A paper copy in the mail If you have any lab test that is abnormal or we need to change your treatment, we will call you to review the results.   Testing/Procedures: None ordered   Follow-Up: At [facility], you and your health needs are our priority.  As part of our continuing mission to provide you with exceptional heart care, we have created designated Provider Care Teams.  These Care Teams include your primary Cardiologist (physician) and Advanced Practice Providers (APPs -  Physician Assistants and Nurse Practitioners) who all work together to provide you with the care you need, when you need it.  We recommend signing up for the patient portal called "MyChart".  Sign up information is provided on this After Visit Summary.  MyChart is used to connect with patients for Virtual Visits (Telemedicine).  Patients are able to view lab/test results, encounter notes, upcoming appointments, etc.  Non-urgent messages can be sent to your provider as well.   To learn more about what you can do with MyChart, go to [email or web address].    Your next appointment:   1 month(s)  The format for your next appointment:   Virtual Visit   Provider:   You may see Skeet Latch, MD or one of the following Advanced Practice Providers on your designated Care Team:    [name], PA-C  [place], [place]  Coletta Memos, [place]    Other Instructions Please keep your feet elevated as much as you can.

## 2020-07-24 DIAGNOSIS — J432 Centrilobular emphysema: Secondary | ICD-10-CM | POA: Diagnosis not present

## 2020-07-24 DIAGNOSIS — G4733 Obstructive sleep apnea (adult) (pediatric): Secondary | ICD-10-CM | POA: Diagnosis not present

## 2020-07-24 DIAGNOSIS — I272 Pulmonary hypertension, unspecified: Secondary | ICD-10-CM | POA: Diagnosis not present

## 2020-07-24 DIAGNOSIS — I4891 Unspecified atrial fibrillation: Secondary | ICD-10-CM | POA: Diagnosis not present

## 2020-07-24 DIAGNOSIS — I10 Essential (primary) hypertension: Secondary | ICD-10-CM | POA: Diagnosis not present

## 2020-07-24 DIAGNOSIS — J9611 Chronic respiratory failure with hypoxia: Secondary | ICD-10-CM | POA: Diagnosis not present

## 2020-07-25 ENCOUNTER — Other Ambulatory Visit: Payer: Self-pay

## 2020-07-25 ENCOUNTER — Ambulatory Visit (INDEPENDENT_AMBULATORY_CARE_PROVIDER_SITE_OTHER): Payer: Medicare Other | Admitting: Pulmonary Disease

## 2020-07-25 ENCOUNTER — Encounter: Payer: Self-pay | Admitting: Pulmonary Disease

## 2020-07-25 VITALS — BP 118/62 | HR 102 | Temp 98.5°F | Ht 67.0 in | Wt 130.0 lb

## 2020-07-25 DIAGNOSIS — J432 Centrilobular emphysema: Secondary | ICD-10-CM | POA: Diagnosis not present

## 2020-07-25 DIAGNOSIS — J449 Chronic obstructive pulmonary disease, unspecified: Secondary | ICD-10-CM

## 2020-07-25 DIAGNOSIS — R1310 Dysphagia, unspecified: Secondary | ICD-10-CM | POA: Diagnosis not present

## 2020-07-25 DIAGNOSIS — J9611 Chronic respiratory failure with hypoxia: Secondary | ICD-10-CM | POA: Diagnosis not present

## 2020-07-25 DIAGNOSIS — Z7952 Long term (current) use of systemic steroids: Secondary | ICD-10-CM

## 2020-07-25 DIAGNOSIS — Z0000 Encounter for general adult medical examination without abnormal findings: Secondary | ICD-10-CM | POA: Diagnosis not present

## 2020-07-25 DIAGNOSIS — I10 Essential (primary) hypertension: Secondary | ICD-10-CM | POA: Diagnosis not present

## 2020-07-25 DIAGNOSIS — I4891 Unspecified atrial fibrillation: Secondary | ICD-10-CM | POA: Diagnosis not present

## 2020-07-25 DIAGNOSIS — R918 Other nonspecific abnormal finding of lung field: Secondary | ICD-10-CM

## 2020-07-25 DIAGNOSIS — I480 Paroxysmal atrial fibrillation: Secondary | ICD-10-CM

## 2020-07-25 DIAGNOSIS — G4733 Obstructive sleep apnea (adult) (pediatric): Secondary | ICD-10-CM | POA: Diagnosis not present

## 2020-07-25 DIAGNOSIS — I272 Pulmonary hypertension, unspecified: Secondary | ICD-10-CM | POA: Diagnosis not present

## 2020-07-25 NOTE — Assessment & Plan Note (Signed)
Plan: Would recommend seasonal flu vaccine in fall/2021

## 2020-07-25 NOTE — Progress Notes (Signed)
@Patient  ID: Andrew [name], male    DOB: Apr 10, 1941, 79 y.o.   MRN: [id number]  Chief Complaint  Patient presents with  . 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 Valeta Harms  07/25/2020  - Visit   79 year old male former smoker followed in our office for COPD.  Patient is established with hospice.  Patient has had a hospitalization in July/2021.  Last seen in our office in August/2021 by SG NP.  This was a virtual visit.  Plan of care from that visit was to have a follow-up CT scan first week of September patient then was seen in the emergency room on 07/10/2020 as well as on 07/18/2020 for A. fib.  Patient was then followed again by cardiology on 07/21/2020.  The office visit from cardiology's assessment and plan as listed below:  ASSESSMENT AND PLAN:  1.  Paroxysmal atrial fibrillation: He remains on diltiazem 180 mg, aspirin 81 mg.  He was not found to be anemic in ED and has not noted any bleeding, hemoptysis, or epistaxis.  Heart rate is controlled today.  2.  Dehydration: We will decrease his Lasix to 20 mg 3 times a week from 40 mg 3 times a week.  He does have a high likelihood of CHF in the setting of atrial fibrillation, however his blood pressure is low normal, and he is not eating and drinking as well lately.  He states when he fell he did also lose consciousness briefly when he landed on the floor.  I worry that he may have issues with dehydration at a higher dose of Lasix.  It they are finding that he is beginning to retain fluid and having worsening breathing issues, he will go back up to the 40 mg 3 times a week.  We will see  him in 1 month on follow-up.  He wishes a televisit.  3.  End-stage COPD: Remains on oxygen via nasal cannula at 3 L this is an increase in oxygen level compared to a few months ago where he was at 2 L.  He is being followed by pulmonary and is in hospice.  4.  Hyperlipidemia: Remains on Crestor 10 mg daily.    Current medicines are reviewed at length with the patient today.  I have spent 25 minuteas dedicated to the care of this patient on the date of this encounter to include pre-visit review of records, assessment, management and diagnostic testing,with shared decision making.  Labs/ tests ordered today include: None-reviewed ED visit labs.  Phill Myron. [place], ANP, AACC  Patient reporting that last emergency room visit on 07/18/2020 with status post fall.  He feels that his breathing is at his baseline.  He has received the COVID-19 vaccines.  Patient remains adherent to DuoNeb's every 6-8 hours as needed for shortness of breath or wheezing.  He is established with hospice.  Patient continues to be maintained on prednisone 10 mg daily.  Most recent chest x-ray in patient's chart shows on 07/10/2020 radiology reading showing advanced emphysema as well as an ovoid 18 mm density in the left infrahilar region that could represent a retained pill within the esophagus.  Patient denies any recent  swallow studies.  Patient does admit that sometimes he has silent reflux, cough as well as trouble swallowing.  He personally does not feel that this is a large issue for him right now.  We will discuss and evaluate this today.   Questionaires / Pulmonary Flowsheets:   ACT:  No flowsheet data found.  MMRC: mMRC Dyspnea Scale mMRC Score  07/25/2020 3  11/18/2019 3  07/20/2019 3    Epworth:  No flowsheet data found.  Tests:   11/18/2019-chest x-ray-no acute abnormality, chronic severe bullous emphysema  12/14/2018-CT chest without contrast- advanced emphysema, patchy mucoid impaction of  bronchi, arthrosclerosis  01/07/2019-pulmonary function test- FVC 2.14 (59% predicted), postbronchodilator ratio 38, postbronchodilator FEV1 0.80 (31% predicted), no bronchodilator response, DLCO 24  09/10/2017-echocardiogram-LV ejection fraction 55 to 60%, PA peak pressure 33  07/10/2020-chest x-ray-advanced emphysema with bullous disease in the apices, ovoid 818 mm density in the left infrahilar region has well-circumscribed margins and projects posterior in the lower lobe bronchus and lateral view   FENO:  No results found for: NITRICOXIDE  PFT: PFT Results Latest Ref Rng & Units 01/07/2019  FVC-Pre L 2.14  FVC-Predicted Pre % 59  FVC-Post L 2.09  FVC-Predicted Post % 58  Pre FEV1/FVC % % 40  Post FEV1/FCV % % 38  FEV1-Pre L 0.87  FEV1-Predicted Pre % 34  FEV1-Post L 0.80  DLCO uncorrected ml/min/mmHg 5.49  DLCO UNC% % 24  DLCO corrected ml/min/mmHg 5.52  DLCO COR %Predicted % 24  DLVA Predicted % 44  TLC L 8.03  TLC % Predicted % 124  RV % Predicted % 223    WALK:  SIX MIN WALK 10/06/2018 07/13/2018 11/25/2016 08/15/2016 05/02/2016 10/02/2015  Supplimental Oxygen during Test? (L/min) - - Yes - - Yes  O2 Flow Rate - - 3 - - 2  Type - - Pulse - - Pulse  2 Minute Oxygen Saturation % 97 96 - 98 122 -  2 Minute HR 106 81 - 103 122 -  4 Minute Oxygen Saturation % 95 95 - 94 123 -  4 Minute HR 115 79 - 111 123 -  6 Minute Oxygen Saturation % 95 97 - 91 118 -  6 Minute HR 117 80 - 115 118 -  Tech Comments: - - pt placed on 3 liters cont and o2 up to 92% with HR at 83 - - Pt became SOB after 2nd lap and requested to go back to his room     Imaging: DG Chest 2 View  Result Date: 07/10/2020 CLINICAL DATA:  Shortness of breath for 1 day. EXAM: CHEST - 2 VIEW COMPARISON:  Radiograph and chest CT 06/17/2020 FINDINGS: Advanced emphysema with bullous disease in the apices. Stable heart size and mediastinal contours. Ovoid 18 mm density in the left infrahilar region not seen on prior  exam has well circumscribed margins and projects posterior to the lower lobe bronchus on the lateral view. No pneumothorax or pleural effusion. Slight improvement in the right lung base opacities from prior exam, better visualized on prior CT. Chain sutures in the right mid lung. No acute osseous abnormalities are seen. Bones are under mineralized. IMPRESSION: 1. Advanced emphysema with bullous disease in the apices. 2. Ovoid 18 mm density in the left infrahilar region has well circumscribed margins and projects posterior to the lower lobe bronchus on the lateral view. This may represent a retained pill within the esophagus. Recommend correlation for any dysphagia symptoms. Electronically Signed   By: Aurther Loft.D.  On: 07/10/2020 15:55    Lab Results:  CBC    Component Value Date/Time   WBC 10.6 (H) 07/18/2020 1324   RBC 4.11 (L) 07/18/2020 1324   HGB 13.3 07/18/2020 1324   HCT 41.5 07/18/2020 1324   PLT 241 07/18/2020 1324   MCV 101.0 (H) 07/18/2020 1324   MCH 32.4 07/18/2020 1324   MCHC 32.0 07/18/2020 1324   RDW 13.1 07/18/2020 1324   LYMPHSABS 0.5 (L) 07/10/2020 1630   MONOABS 0.5 07/10/2020 1630   EOSABS 0.0 07/10/2020 1630   BASOSABS 0.0 07/10/2020 1630    BMET    Component Value Date/Time   NA 139 07/18/2020 1324   K 3.3 (L) 07/18/2020 1324   CL 101 07/18/2020 1324   CO2 30 07/18/2020 1324   GLUCOSE 101 (H) 07/18/2020 1324   BUN 25 (H) 07/18/2020 1324   CREATININE 1.02 07/18/2020 1324   CREATININE 1.11 11/18/2019 1209   CALCIUM 9.3 07/18/2020 1324   GFRNONAA >60 07/18/2020 1324   GFRAA >60 07/18/2020 1324    BNP    Component Value Date/Time   BNP 112.7 (H) 07/10/2020 1630   BNP 18 11/18/2019 1209    ProBNP    Component Value Date/Time   PROBNP 23.0 09/28/2018 1242    Specialty Problems      Pulmonary Problems   Chronic hypoxemic respiratory failure (HCC)   Pneumonia of both lower lobes due to infectious organism   End stage COPD ([place])     12/14/2018-CT chest without contrast- advanced emphysema, patchy mucoid impaction of bronchi, arthrosclerosis  01/07/2019-pulmonary function test- FVC 2.14 (59% predicted), postbronchodilator ratio 38, postbronchodilator FEV1 0.80 (31% predicted), no bronchodilator response, DLCO 24       Acute on chronic respiratory failure with hypoxia (HCC)   COPD with acute exacerbation (HCC)   OSA on CPAP   COPD exacerbation (HCC)   Dyspnea      No Known Allergies  Immunization History  Administered Date(s) Administered  . Fluad Quad(high Dose 65+) 07/20/2019  . Influenza, High Dose Seasonal PF 07/22/2016, 08/07/2017, 08/08/2018  . Influenza-Unspecified 09/19/2015  . PFIZER SARS-COV-2 Vaccination 01/16/2020, 02/16/2020  . PPD Test 02/15/2016  . Pneumococcal Conjugate-13 09/19/2015  . Pneumococcal Polysaccharide-23 12/20/2016  . Zoster Recombinat (Shingrix) 12/23/2017, 02/20/2018    Past Medical History:  Diagnosis Date  . Anemia   . BiPAP (biphasic positive airway pressure) dependence    Pt denies history of OSA  . BPH (benign prostatic hyperplasia)   . Cancer ([place])    skin  . COPD (chronic obstructive pulmonary disease) ([place])   . Dysphagia   . Emphysema lung ([place])   . GERD (gastroesophageal reflux disease)    " Silent reflux"  . Hearing loss    right ear  . History of hiatal hernia   . HLD (hyperlipidemia)   . Hypertension   . Oxygen dependent    2-3 liters  . Oxygen dependent   . Pneumonia   . Pulmonary hypertension ([place])   . Sleep apnea    wears cpap  . TIA (transient ischemic attack) 04/27/2020    Tobacco History: Social History   Tobacco Use  Smoking Status Former Smoker  . Packs/day: 0.00  . Years: 0.00  . Pack years: 0.00  . Types: Cigarettes  . Quit date: 10/01/1984  . Years since quitting: 35.8  Smokeless Tobacco Never Used   Counseling given: Yes   Continue to not smoke  Outpatient Encounter Medications as of 07/25/2020  Medication Sig  .  Ascorbic  Acid (VITAMIN C) 1000 MG tablet Take 1,000 mg by mouth at bedtime.  Marland Kitchen aspirin EC 81 MG tablet Take 1 tablet (81 mg total) by mouth daily.  . Cholecalciferol (VITAMIN D) 50 MCG (2000 UT) tablet Take 2,000 Units by mouth at bedtime.  Marland Kitchen diltiazem (CARDIZEM CD) 180 MG 24 hr capsule TAKE 1 CAPSULE EVERY DAY (NEEDS OFFICE VISIT FOR FURTHER REFILLS) (Patient taking differently: Take 180 mg by mouth at bedtime. )  . finasteride (PROSCAR) 5 MG tablet Take 5 mg by mouth 2 (two) times daily.   . furosemide (LASIX) 20 MG tablet Take 1 tablet (20 mg total) by mouth every Monday, Wednesday, and Friday.  Marland Kitchen guaiFENesin (MUCINEX) 600 MG 12 hr tablet Take 1 tablet (600 mg total) by mouth 2 (two) times daily.  Marland Kitchen levalbuterol (XOPENEX) 0.63 MG/3ML nebulizer solution SMARTSIG:3 Milliliter(s) Via Nebulizer Every 4 Hours PRN  . LORazepam (ATIVAN) 0.5 MG tablet Take 0.5 mg by mouth every 4 (four) hours as needed for anxiety.   . Melatonin 10 MG CAPS Take 1 capsule by mouth at bedtime.  . metoCLOPramide (REGLAN) 10 MG tablet Take 10 mg by mouth 3 (three) times daily before meals.  Marland Kitchen morphine (MSIR) 15 MG tablet Take 15 mg by mouth every 4 (four) hours as needed (pain).   Marland Kitchen omeprazole (PRILOSEC) 40 MG capsule Take 40 mg by mouth 2 (two) times daily.   Marland Kitchen OVER THE COUNTER MEDICATION Take 1 capsule by mouth at bedtime. CBD oil  . OXYGEN Inhale 3 L into the lungs continuous.   . polyethylene glycol (MIRALAX / GLYCOLAX) packet Take 17 g by mouth daily. (Patient taking differently: Take 17 g by mouth at bedtime. Mix in 8 oz liquid and drink)  . potassium chloride 20 MEQ TBCR Take 20 mEq by mouth daily. (Patient taking differently: Take 20 mEq by mouth every Monday, Wednesday, and Friday. )  . predniSONE (DELTASONE) 10 MG tablet Take 3 tablets for 3 days then resume home dose of 10 mg.  . prochlorperazine (COMPAZINE) 10 MG tablet Take 10 mg by mouth every 4 (four) hours as needed for nausea or vomiting.   . rosuvastatin (CRESTOR)  10 MG tablet Take 10 mg by mouth at bedtime.   . senna (SENOKOT) 8.6 MG TABS tablet Take 2 tablets by mouth 2 (two) times daily.  . sildenafil (REVATIO) 20 MG tablet Take 1 tablet (20 mg total) by mouth 2 (two) times daily.  Marland Kitchen Spacer/Aero-Holding Chambers (AEROCHAMBER MV) inhaler Use as instructed  . tamsulosin (FLOMAX) 0.4 MG CAPS capsule Take 1 capsule (0.4 mg total) by mouth daily after breakfast.  . temazepam (RESTORIL) 30 MG capsule Take 1 capsule (30 mg total) by mouth at bedtime.  . [DISCONTINUED] albuterol (PROVENTIL HFA;VENTOLIN HFA) 108 (90 Base) MCG/ACT inhaler Inhale 2 puffs into the lungs every 6 (six) hours as needed for wheezing or shortness of breath.  . [DISCONTINUED] ipratropium-albuterol (DUONEB) 0.5-2.5 (3) MG/3ML SOLN Take 3 mLs by nebulization every 6 (six) hours as needed. (Patient taking differently: Take 3 mLs by nebulization 3 (three) times daily. )   No facility-administered encounter medications on file as of 07/25/2020.     Review of Systems  Review of Systems  Constitutional: Positive for fatigue. Negative for activity change, chills, fever and unexpected weight change.  HENT: Positive for congestion and trouble swallowing. Negative for postnasal drip, rhinorrhea, sinus pressure, sinus pain and sore throat.   Eyes: Negative.   Respiratory: Positive for cough and shortness  of breath. Negative for wheezing.   Cardiovascular: Negative for chest pain and palpitations.  Gastrointestinal: Negative for diarrhea, nausea and vomiting.  Endocrine: Negative.   Genitourinary: Negative.   Musculoskeletal: Negative.   Skin: Negative.   Neurological: Negative for dizziness and headaches.  Psychiatric/Behavioral: Negative.  Negative for dysphoric mood. The patient is not nervous/anxious.   All other systems reviewed and are negative.    Physical Exam  BP 118/62 (BP Location: Left Arm, Cuff Size: Normal)   Pulse (!) 102   Temp 98.5 F (36.9 C) (Oral)   Ht 5\' 7"  (1.702  m)   Wt 130 lb (59 kg)   SpO2 97%   BMI 20.36 kg/m   Wt Readings from Last 5 Encounters:  07/25/20 130 lb (59 kg)  07/21/20 130 lb (59 kg)  07/18/20 130 lb (59 kg)  07/07/20 130 lb (59 kg)  06/18/20 134 lb 11.2 oz (61.1 kg)    BMI Readings from Last 5 Encounters:  07/25/20 20.36 kg/m  07/21/20 20.36 kg/m  07/18/20 20.36 kg/m  07/07/20 20.36 kg/m  06/18/20 21.10 kg/m     Physical Exam Vitals and nursing note reviewed.  Constitutional:      General: He is not in acute distress.    Appearance: Normal appearance.     Comments: Thin frail elderly male  HENT:     Head: Normocephalic and atraumatic.     Right Ear: Hearing and external ear normal.     Left Ear: Hearing and external ear normal.     Nose: Nose normal. No mucosal edema or rhinorrhea.     Right Turbinates: Not enlarged.     Left Turbinates: Not enlarged.     Mouth/Throat:     Mouth: Mucous membranes are dry.     Pharynx: Oropharynx is clear. No oropharyngeal exudate.  Eyes:     Pupils: Pupils are equal, round, and reactive to light.  Cardiovascular:     Rate and Rhythm: Normal rate and regular rhythm.     Pulses: Normal pulses.     Heart sounds: Normal heart sounds. No murmur heard.   Pulmonary:     Effort: Pulmonary effort is normal.     Breath sounds: No decreased breath sounds, wheezing or rales.     Comments: Diminished breath sounds throughout exam Musculoskeletal:     Cervical back: Normal range of motion.     Right lower leg: 1+ Edema present.     Left lower leg: 1+ Edema present.  Lymphadenopathy:     Cervical: No cervical adenopathy.  Skin:    General: Skin is warm and dry.     Capillary Refill: Capillary refill takes less than 2 seconds.     Findings: No erythema or rash.  Neurological:     General: No focal deficit present.     Mental Status: He is alert and oriented to person, place, and time.     Motor: No weakness.     Coordination: Coordination normal.     Gait: Gait is intact.  Gait normal.  Psychiatric:        Mood and Affect: Mood normal.        Behavior: Behavior normal. Behavior is cooperative.        Thought Content: Thought content normal.        Judgment: Judgment normal.       Assessment & Plan:   Paroxysmal atrial fibrillation (HCC) Plan: Continue follow-up with cardiology  End stage COPD ([place]) Plan: Continue follow-up with  hospice Continue duo nebs as needed Continue prednisone 10 mg Follow-up in 6 weeks We will order a CT of chest We will order swallow study  Chronic hypoxemic respiratory failure ([place]) Plan: Continue oxygen therapy as prescribed   Dysphagia Abnormal chest x-ray in August/2021 Patient reporting episodes of dysphagia, cough, trouble swallowing  Plan: We will order swallow study today Order CT of chest  Healthcare maintenance Plan: Would recommend seasonal flu vaccine in fall/2021  Abnormal findings on diagnostic imaging of lung Abnormal chest x-ray in August/2021 CT in July/2021 showing potential tree-in-bud opacities, recommending close CT imaging follow-up, advanced emphysema  Plan: We will order CT chest without contrast We will order swallow study Follow-up in 6 weeks  Current chronic use of systemic steroids Plan: Continue 10 mg of prednisone daily    Return in about 6 weeks (around 09/05/2020), or if symptoms worsen or fail to improve, for Follow up with Dr. Valeta Harms, After Chest CT.   [name], NP 07/25/2020   This appointment required 42 minutes of patient care (this includes precharting, chart review, review of results, face-to-face care, etc.).

## 2020-07-25 NOTE — Assessment & Plan Note (Signed)
Plan: Continue follow-up with hospice Continue duo nebs as needed Continue prednisone 10 mg Follow-up in 6 weeks We will order a CT of chest We will order swallow study

## 2020-07-25 NOTE — Assessment & Plan Note (Signed)
Abnormal chest x-ray in August/2021 Patient reporting episodes of dysphagia, cough, trouble swallowing  Plan: We will order swallow study today Order CT of chest

## 2020-07-25 NOTE — Patient Instructions (Addendum)
You were seen today by [name], NP  for:   1. End stage COPD ([place])  Continue to work with home hospice  Continue oxygen therapy  We will obtain a CT of your chest  We will order a swallow study  Continue to use levalbuterol  Continue daily prednisone dose 10 mg  2. Chronic hypoxemic respiratory failure (HCC)  Continue oxygen therapy as prescribed  >>>maintain oxygen saturations greater than 88 percent  >>>if unable to maintain oxygen saturations please contact the office  >>>do not smoke with oxygen  >>>can use nasal saline gel or nasal saline rinses to moisturize nose if oxygen causes dryness  3. Abnormal findings on diagnostic imaging of lung  - CT Chest Wo Contrast; Future  Need CT completed within the next 2 weeks to compare to July/2021 CT chest as well as abnormal x-ray in August/2021  4. Dysphagia, unspecified type  - SLP modified barium swallow; Future  As discussed today we will need to further evaluate our concerns that you may have trouble swallowing  5. Paroxysmal atrial fibrillation (HCC)  Continue follow-up with cardiology  6. Healthcare maintenance  Currently right now our recommendations are for patient to receiving daily prednisone less than 20 mg daily they do not require the Covid 19 booster  As discussed today likely this verbiage will change over the coming weeks.  If and when the COVID-19 booster is made available to you we would recommend it    We recommend today:  Orders Placed This Encounter  Procedures   CT Chest Wo Contrast    Standing Status:   Future    Standing Expiration Date:   07/25/2021    Scheduling Instructions:     Within next 2 weeks    Order Specific Question:   Preferred imaging location?    Answer:   [address]    Order Specific Question:   Radiology Contrast Protocol - do NOT remove file path    Answer:   [email or web address]   SLP modified barium swallow     Standing Status:   Future    Standing Expiration Date:   07/25/2021    Scheduling Instructions:     Within next 2 weeks   Orders Placed This Encounter  Procedures   CT Chest Wo Contrast   SLP modified barium swallow   No orders of the defined types were placed in this encounter.   Follow Up:    Return in about 6 weeks (around 09/05/2020), or if symptoms worsen or fail to improve, for Follow up with Dr. Valeta Harms, After Chest CT.   Notification of test results are managed in the following manner: If there are  any recommendations or changes to the  plan of care discussed in office today,  we will contact you and let you know what they are. If you do not hear from [place], then your results are normal and you can view them through your  MyChart account , or a letter will be sent to you. Thank you again for trusting [place] with your care  - Thank you, [facility]    It is flu season:   >>> Best ways to protect herself from the flu: Receive the yearly flu vaccine, practice good hand hygiene washing with soap and also using hand sanitizer when available, eat a nutritious meals, get adequate rest, hydrate appropriately       Please contact the office if your symptoms worsen or you have concerns that you  are not improving.   Thank you for choosing [facility] for your healthcare, and for allowing [place] to partner with you on your healthcare journey. I am thankful to be able to provide care to you today.   Wyn Quaker FNP-C

## 2020-07-25 NOTE — Assessment & Plan Note (Addendum)
Abnormal chest x-ray in August/2021 CT in July/2021 showing potential tree-in-bud opacities, recommending close CT imaging follow-up, advanced emphysema  Plan: We will order CT chest without contrast We will order swallow study Follow-up in 6 weeks

## 2020-07-25 NOTE — Assessment & Plan Note (Signed)
Plan: Continue 10 mg of prednisone daily

## 2020-07-25 NOTE — Assessment & Plan Note (Signed)
Plan: Continue follow-up with cardiology 

## 2020-07-25 NOTE — Assessment & Plan Note (Signed)
Plan: Continue oxygen therapy as prescribed 

## 2020-07-26 ENCOUNTER — Other Ambulatory Visit (HOSPITAL_COMMUNITY): Payer: Self-pay

## 2020-07-26 DIAGNOSIS — R059 Cough, unspecified: Secondary | ICD-10-CM

## 2020-07-26 DIAGNOSIS — R1310 Dysphagia, unspecified: Secondary | ICD-10-CM

## 2020-07-26 NOTE — Progress Notes (Signed)
Thanks for seeing him Garner Nash, DO [facility] 07/26/2020 5:03 PM

## 2020-07-31 ENCOUNTER — Telehealth: Payer: Self-pay | Admitting: Pulmonary Disease

## 2020-07-31 DIAGNOSIS — I4891 Unspecified atrial fibrillation: Secondary | ICD-10-CM | POA: Diagnosis not present

## 2020-07-31 DIAGNOSIS — G4733 Obstructive sleep apnea (adult) (pediatric): Secondary | ICD-10-CM | POA: Diagnosis not present

## 2020-07-31 DIAGNOSIS — I272 Pulmonary hypertension, unspecified: Secondary | ICD-10-CM | POA: Diagnosis not present

## 2020-07-31 DIAGNOSIS — I10 Essential (primary) hypertension: Secondary | ICD-10-CM | POA: Diagnosis not present

## 2020-07-31 DIAGNOSIS — J9611 Chronic respiratory failure with hypoxia: Secondary | ICD-10-CM | POA: Diagnosis not present

## 2020-07-31 DIAGNOSIS — J432 Centrilobular emphysema: Secondary | ICD-10-CM | POA: Diagnosis not present

## 2020-07-31 NOTE — Telephone Encounter (Signed)
error 

## 2020-07-31 NOTE — Telephone Encounter (Signed)
Calling about the lasix and postassium order, also swallow test and ct is not covered under hospice. Almyra Free from [place] can be reached at [phone]

## 2020-07-31 NOTE — Telephone Encounter (Signed)
Tried calling and there was no answer LMTCB

## 2020-08-01 DIAGNOSIS — I272 Pulmonary hypertension, unspecified: Secondary | ICD-10-CM | POA: Diagnosis not present

## 2020-08-01 DIAGNOSIS — J9611 Chronic respiratory failure with hypoxia: Secondary | ICD-10-CM | POA: Diagnosis not present

## 2020-08-01 DIAGNOSIS — I4891 Unspecified atrial fibrillation: Secondary | ICD-10-CM | POA: Diagnosis not present

## 2020-08-01 DIAGNOSIS — J432 Centrilobular emphysema: Secondary | ICD-10-CM | POA: Diagnosis not present

## 2020-08-01 DIAGNOSIS — I10 Essential (primary) hypertension: Secondary | ICD-10-CM | POA: Diagnosis not present

## 2020-08-01 DIAGNOSIS — G4733 Obstructive sleep apnea (adult) (pediatric): Secondary | ICD-10-CM | POA: Diagnosis not present

## 2020-08-01 NOTE — Telephone Encounter (Signed)
Lmtcb for [employer] with [employer]

## 2020-08-02 DIAGNOSIS — I272 Pulmonary hypertension, unspecified: Secondary | ICD-10-CM | POA: Diagnosis not present

## 2020-08-02 DIAGNOSIS — J432 Centrilobular emphysema: Secondary | ICD-10-CM | POA: Diagnosis not present

## 2020-08-02 DIAGNOSIS — I10 Essential (primary) hypertension: Secondary | ICD-10-CM | POA: Diagnosis not present

## 2020-08-02 DIAGNOSIS — I4891 Unspecified atrial fibrillation: Secondary | ICD-10-CM | POA: Diagnosis not present

## 2020-08-02 DIAGNOSIS — J9611 Chronic respiratory failure with hypoxia: Secondary | ICD-10-CM | POA: Diagnosis not present

## 2020-08-02 DIAGNOSIS — G4733 Obstructive sleep apnea (adult) (pediatric): Secondary | ICD-10-CM | POA: Diagnosis not present

## 2020-08-03 ENCOUNTER — Ambulatory Visit (HOSPITAL_COMMUNITY): Payer: Medicare Other

## 2020-08-03 ENCOUNTER — Encounter (HOSPITAL_COMMUNITY): Payer: Medicare Other

## 2020-08-03 DIAGNOSIS — I4891 Unspecified atrial fibrillation: Secondary | ICD-10-CM | POA: Diagnosis not present

## 2020-08-03 DIAGNOSIS — J432 Centrilobular emphysema: Secondary | ICD-10-CM | POA: Diagnosis not present

## 2020-08-03 DIAGNOSIS — I10 Essential (primary) hypertension: Secondary | ICD-10-CM | POA: Diagnosis not present

## 2020-08-03 DIAGNOSIS — J9611 Chronic respiratory failure with hypoxia: Secondary | ICD-10-CM | POA: Diagnosis not present

## 2020-08-03 DIAGNOSIS — G4733 Obstructive sleep apnea (adult) (pediatric): Secondary | ICD-10-CM | POA: Diagnosis not present

## 2020-08-03 DIAGNOSIS — I272 Pulmonary hypertension, unspecified: Secondary | ICD-10-CM | POA: Diagnosis not present

## 2020-08-03 NOTE — Telephone Encounter (Signed)
Called and spoke to [place] with [place] [facility] and was advised that the CT chest and swallow study is not covered because pt is with hospice. Almyra Free states the pt does have private insurance so there may be a chance it is covered but most of the time it isnt cause he is with hospice.   Will forward to Aaron Edelman as FYI and PCCs to see if private insurance would cover this. Thanks.

## 2020-08-04 NOTE — Telephone Encounter (Signed)
Attempted to call [facility] to speak with Hassan Rowan but unable to reach. Left message for her to return call.

## 2020-08-04 NOTE — Telephone Encounter (Signed)
When a pt is under Hospice their insurance goes through hospice I would think that hospice would handle this Joellen Jersey

## 2020-08-04 NOTE — Telephone Encounter (Signed)
Will await response from [facility] pool.  Wyn Quaker, FNP

## 2020-08-04 NOTE — Telephone Encounter (Signed)
I'm showing the patient has Medicare Primary & [place] who do we reach out to for this one?

## 2020-08-07 ENCOUNTER — Other Ambulatory Visit: Payer: Medicare Other

## 2020-08-07 DIAGNOSIS — J432 Centrilobular emphysema: Secondary | ICD-10-CM | POA: Diagnosis not present

## 2020-08-07 DIAGNOSIS — G4733 Obstructive sleep apnea (adult) (pediatric): Secondary | ICD-10-CM | POA: Diagnosis not present

## 2020-08-07 DIAGNOSIS — J9611 Chronic respiratory failure with hypoxia: Secondary | ICD-10-CM | POA: Diagnosis not present

## 2020-08-07 DIAGNOSIS — I4891 Unspecified atrial fibrillation: Secondary | ICD-10-CM | POA: Diagnosis not present

## 2020-08-07 DIAGNOSIS — I10 Essential (primary) hypertension: Secondary | ICD-10-CM | POA: Diagnosis not present

## 2020-08-07 DIAGNOSIS — I272 Pulmonary hypertension, unspecified: Secondary | ICD-10-CM | POA: Diagnosis not present

## 2020-08-08 NOTE — Telephone Encounter (Signed)
Sorry to tell you however [employer] called me back to say if medicare don't pay they wont pay sorry for the wrong info Joellen Jersey

## 2020-08-08 NOTE — Telephone Encounter (Signed)
Okay noted.Please route to Dr. Valeta Harms is he will see the patientOctober/2021.  Cancel CT chest and modified barium swallow at this time given this information.Wyn Quaker, FNP

## 2020-08-08 NOTE — Telephone Encounter (Signed)
I already spoke with Hospice. They stated they would not be covering these procedures but because pt has a secondary insurance it may be covered through them.   Is this something PCC's can see if it will be covered? Thanks.

## 2020-08-08 NOTE — Telephone Encounter (Signed)
Will forward to [place] as FYI.   If [employer] and supplement will cover the CT chest and modified barium swallow, then ok to schedule.

## 2020-08-08 NOTE — Telephone Encounter (Signed)
Ok thanks Garner Nash, DO [facility] 08/08/2020 4:25 PM

## 2020-08-08 NOTE — Telephone Encounter (Signed)
I have spoken to Andrew [name]  [phone] who states no precert is required and that even if Hospice will not pay for these test [employer] will cover with  medicare suppliment part of these test we need to know if they still need to be scheduled because they got cancelled because they were a hospice pt [phone]

## 2020-08-08 NOTE — Telephone Encounter (Signed)
I will check with [employer] and see if these will be covered Joellen Jersey

## 2020-08-08 NOTE — Telephone Encounter (Signed)
appt to see dr Valeta Harms 09/01/20 Joellen Jersey

## 2020-08-10 ENCOUNTER — Other Ambulatory Visit: Payer: Medicare Other

## 2020-08-10 DIAGNOSIS — I272 Pulmonary hypertension, unspecified: Secondary | ICD-10-CM | POA: Diagnosis not present

## 2020-08-10 DIAGNOSIS — I4891 Unspecified atrial fibrillation: Secondary | ICD-10-CM | POA: Diagnosis not present

## 2020-08-10 DIAGNOSIS — J432 Centrilobular emphysema: Secondary | ICD-10-CM | POA: Diagnosis not present

## 2020-08-10 DIAGNOSIS — I10 Essential (primary) hypertension: Secondary | ICD-10-CM | POA: Diagnosis not present

## 2020-08-10 DIAGNOSIS — J9611 Chronic respiratory failure with hypoxia: Secondary | ICD-10-CM | POA: Diagnosis not present

## 2020-08-10 DIAGNOSIS — G4733 Obstructive sleep apnea (adult) (pediatric): Secondary | ICD-10-CM | POA: Diagnosis not present

## 2020-08-11 DIAGNOSIS — I10 Essential (primary) hypertension: Secondary | ICD-10-CM | POA: Diagnosis not present

## 2020-08-11 DIAGNOSIS — I272 Pulmonary hypertension, unspecified: Secondary | ICD-10-CM | POA: Diagnosis not present

## 2020-08-11 DIAGNOSIS — I4891 Unspecified atrial fibrillation: Secondary | ICD-10-CM | POA: Diagnosis not present

## 2020-08-11 DIAGNOSIS — G4733 Obstructive sleep apnea (adult) (pediatric): Secondary | ICD-10-CM | POA: Diagnosis not present

## 2020-08-11 DIAGNOSIS — J432 Centrilobular emphysema: Secondary | ICD-10-CM | POA: Diagnosis not present

## 2020-08-11 DIAGNOSIS — J9611 Chronic respiratory failure with hypoxia: Secondary | ICD-10-CM | POA: Diagnosis not present

## 2020-08-14 DIAGNOSIS — G4733 Obstructive sleep apnea (adult) (pediatric): Secondary | ICD-10-CM | POA: Diagnosis not present

## 2020-08-14 DIAGNOSIS — J9611 Chronic respiratory failure with hypoxia: Secondary | ICD-10-CM | POA: Diagnosis not present

## 2020-08-14 DIAGNOSIS — I10 Essential (primary) hypertension: Secondary | ICD-10-CM | POA: Diagnosis not present

## 2020-08-14 DIAGNOSIS — I272 Pulmonary hypertension, unspecified: Secondary | ICD-10-CM | POA: Diagnosis not present

## 2020-08-14 DIAGNOSIS — J432 Centrilobular emphysema: Secondary | ICD-10-CM | POA: Diagnosis not present

## 2020-08-14 DIAGNOSIS — I4891 Unspecified atrial fibrillation: Secondary | ICD-10-CM | POA: Diagnosis not present

## 2020-08-17 DIAGNOSIS — J432 Centrilobular emphysema: Secondary | ICD-10-CM | POA: Diagnosis not present

## 2020-08-17 DIAGNOSIS — I4891 Unspecified atrial fibrillation: Secondary | ICD-10-CM | POA: Diagnosis not present

## 2020-08-17 DIAGNOSIS — G4733 Obstructive sleep apnea (adult) (pediatric): Secondary | ICD-10-CM | POA: Diagnosis not present

## 2020-08-17 DIAGNOSIS — I272 Pulmonary hypertension, unspecified: Secondary | ICD-10-CM | POA: Diagnosis not present

## 2020-08-17 DIAGNOSIS — J9611 Chronic respiratory failure with hypoxia: Secondary | ICD-10-CM | POA: Diagnosis not present

## 2020-08-17 DIAGNOSIS — I10 Essential (primary) hypertension: Secondary | ICD-10-CM | POA: Diagnosis not present

## 2020-08-21 ENCOUNTER — Telehealth: Payer: Self-pay | Admitting: Cardiovascular Disease

## 2020-08-21 ENCOUNTER — Telehealth (INDEPENDENT_AMBULATORY_CARE_PROVIDER_SITE_OTHER): Payer: Medicare Other | Admitting: Cardiovascular Disease

## 2020-08-21 ENCOUNTER — Encounter: Payer: Self-pay | Admitting: Cardiovascular Disease

## 2020-08-21 VITALS — BP 137/68 | Ht 67.0 in | Wt 135.0 lb

## 2020-08-21 DIAGNOSIS — I480 Paroxysmal atrial fibrillation: Secondary | ICD-10-CM

## 2020-08-21 DIAGNOSIS — G4733 Obstructive sleep apnea (adult) (pediatric): Secondary | ICD-10-CM

## 2020-08-21 DIAGNOSIS — I272 Pulmonary hypertension, unspecified: Secondary | ICD-10-CM

## 2020-08-21 DIAGNOSIS — E876 Hypokalemia: Secondary | ICD-10-CM

## 2020-08-21 DIAGNOSIS — Z9989 Dependence on other enabling machines and devices: Secondary | ICD-10-CM

## 2020-08-21 DIAGNOSIS — G459 Transient cerebral ischemic attack, unspecified: Secondary | ICD-10-CM | POA: Diagnosis not present

## 2020-08-21 DIAGNOSIS — I10 Essential (primary) hypertension: Secondary | ICD-10-CM

## 2020-08-21 MED ORDER — DILTIAZEM HCL 30 MG PO TABS: ORAL_TABLET | ORAL | 3 refills | Status: AC

## 2020-08-21 NOTE — Telephone Encounter (Signed)
Spoke with Almyra Free with [facility] and clarified diltiazem dosages and BMP orders. No further questions at this time.

## 2020-08-21 NOTE — Telephone Encounter (Signed)
     Pt c/o medication issue:  1. Name of Medication: diltiazem (CARDIZEM) 30 MG tablet  2. How are you currently taking this medication (dosage and times per day)? TAKE 1 TABLET BY MOUTH AS NEEDED FOR AFIB WITH HEART RATE ABOVE 110 FOR 30 MINUTES OR MORE  3. Are you having a reaction (difficulty breathing--STAT)?   4. What is your medication issue? Almyra Free from [facility] hospice calling, she would like to verified this medications. She was told by pt's family Dr. Oval Linsey made changes with his diltiazem

## 2020-08-21 NOTE — Progress Notes (Signed)
Virtual Visit via Telephone Note   This visit type was conducted due to national recommendations for restrictions regarding the COVID-19 Pandemic (e.g. social distancing) in an effort to limit this patient's exposure and mitigate transmission in our community.  Due to his co-morbid illnesses, this patient is at least at moderate risk for complications without adequate follow up.  This format is felt to be most appropriate for this patient at this time.  The patient did not have access to video technology/had technical difficulties with video requiring transitioning to audio format only (telephone).  All issues noted in this document were discussed and addressed.  No physical exam could be performed with this format.  Please refer to the patient's chart for his  consent to telehealth for [facility].   The patient was identified using 2 identifiers.  Date:  08/21/2020   ID:  Andrew [name], DOB [birth year], MRN [id number]  Patient Location: Home Provider Location: Office/Clinic  PCP:  Lujean Amel, MD  Cardiologist:  Skeet Latch, MD  Electrophysiologist:  None   Evaluation Performed:  Follow-Up Visit  Chief Complaint:  Shortness of breath  History of Present Illness:    The patient does not have symptoms concerning for COVID-19 infection (fever, chills, cough, or new shortness of breath).   History of Present Illness: Andrew [name] is a 79 y.o. 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  Andrew [name] was admitted 08/2017 with pneumonia.  The hospitalization was complicated by atrial fibrillation with rapid ventricular response.  He had more than one episode of atrial fibrillation during the admission.  It was thought to be due to his pneumonia.  He was already on diltiazem prior to admission and metoprolol was added to his regimen.  He was started on Eliquis for  anticoagulation.  Thyroid function was normal.  He had an echo that admission that revealed LVEF 55-60%.  It was noted that he has pulmonary hypertension from severe COPD.  He was previously treated in [place] and was started on sildenafil.  He has been working with Dr. Leeanne Deed the dose was lowered however, the patient declined further weaning.    Andrew [name] saw his pulmonologist and was noted to be anemic.  He had positive FOBT testing.  He was referred to Dr. Michail Sermon and underwent upper and lower endoscopy 04/2018.  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  This was treated with APC.  He also had 2 colonic polyps removed.  He was admitted 05/2020 with pneumonia and COPD.  Since then his breathing has improved and is back to baseline.  He is staying home because of his breathing and the pandemic.  He has some stomach discomfort.  He has no chest pain.  He has stable ankle swelling but no orthopnea or PND.  Lasix helps.  At his last appointment he complained of garbled speech and confusion.  This hasn't occurred again.    Since his last appointment Andrew [name] was seen in the ED 06/2020 with a mechanical fall.  In the ED he was in atrial fibrillation with rapid ventricular response with a heart rate of 142.  He was treated with IV diltiazem and metoprolol.  Potassium was 3.3 but labs were otherwise unremarkable.  He followed up with Jory Sims, DNP on 07/2020 and was feeling okay.  He noted that his appetite has been poor.  Lasix was reduced due to intravascular volume depletion and low normal blood pressures.  He is still short of breath but at his baseline.  He gets more short of breath walking through the house but not when he lays down.  He has occasional edema but the lasix helps.  A couple weeks ago he noted that he was in atrial fibrillation.  His heart was alternating between fast  and slow.  The episode lasted 30-40 minutes.  His BP is usually around 120/60s.  Past Medical History:  Diagnosis Date  . Anemia   . BiPAP (biphasic positive airway pressure) dependence    Pt denies history of OSA  . BPH (benign prostatic hyperplasia)   . Cancer ([place])    skin  . COPD (chronic obstructive pulmonary disease) ([place])   . Dysphagia   . Emphysema lung ([place])   . GERD (gastroesophageal reflux disease)    " Silent reflux"  . Hearing loss    right ear  . History of hiatal hernia   . HLD (hyperlipidemia)   . Hypertension   . Oxygen dependent    2-3 liters  . Oxygen dependent   . Pneumonia   . Pulmonary hypertension ([place])   . Sleep apnea    wears cpap  . TIA (transient ischemic attack) 04/27/2020    Past Surgical History:  Procedure Laterality Date  . CARDIAC CATHETERIZATION    . CATARACT EXTRACTION W/ INTRAOCULAR LENS  IMPLANT, BILATERAL    . COLONOSCOPY WITH PROPOFOL N/A 04/20/2018   Procedure: COLONOSCOPY WITH PROPOFOL;  Surgeon: Wilford Corner, MD;  Location: [place];  Service: Endoscopy;  Laterality: N/A;  . ESOPHAGOGASTRODUODENOSCOPY (EGD) WITH PROPOFOL N/A 08/23/2016   Procedure: ESOPHAGOGASTRODUODENOSCOPY (EGD) WITH PROPOFOL;  Surgeon: Wilford Corner, MD;  [facility] ENDOSCOPY;  Service: Endoscopy;  Laterality: N/A;  . ESOPHAGOGASTRODUODENOSCOPY (EGD) WITH PROPOFOL N/A 04/20/2018   Procedure: ESOPHAGOGASTRODUODENOSCOPY (EGD) WITH PROPOFOL;  Surgeon: Wilford Corner, MD;  Location: [place];  Service: Endoscopy;  Laterality: N/A;  . HOT HEMOSTASIS N/A 04/20/2018   Procedure: HOT HEMOSTASIS (ARGON PLASMA COAGULATION/BICAP);  Surgeon: Wilford Corner, MD;  Location: [place];  Service: Endoscopy;  Laterality: N/A;  . LUNG SURGERY    . POLYPECTOMY  04/20/2018   Procedure: POLYPECTOMY;  Surgeon: Wilford Corner, MD;  Location: [facility] ENDOSCOPY;  Service: Endoscopy;;  . TONSILLECTOMY       Current Outpatient Medications  Medication Sig Dispense  Refill  . Ascorbic Acid (VITAMIN C) 1000 MG tablet Take 1,000 mg by mouth at bedtime.    Marland Kitchen aspirin EC 81 MG tablet Take 1 tablet (81 mg total) by mouth daily. 30 tablet 3  . Cholecalciferol (VITAMIN D) 50 MCG (2000 UT) tablet Take 2,000 Units by mouth at bedtime.    Marland Kitchen diltiazem (CARDIZEM CD) 180 MG 24 hr capsule TAKE 1 CAPSULE EVERY DAY (NEEDS OFFICE VISIT FOR FURTHER REFILLS) (Patient taking differently: Take 180 mg by mouth at bedtime. ) 60 capsule 3  . finasteride (PROSCAR) 5 MG tablet Take 5 mg by mouth at bedtime.     . furosemide (LASIX) 20 MG tablet Take 1 tablet (20 mg total) by mouth every Monday, Wednesday, and Friday. 20 tablet 2  . guaiFENesin (MUCINEX) 600 MG 12 hr tablet Take 1 tablet (600 mg total) by mouth 2 (two) times daily. 30 tablet 0  . levalbuterol (XOPENEX) 0.63 MG/3ML nebulizer solution SMARTSIG:3 Milliliter(s) Via Nebulizer Every 4 Hours PRN    . LORazepam (ATIVAN)  0.5 MG tablet Take 0.5 mg by mouth every 4 (four) hours as needed for anxiety.     . Melatonin 10 MG CAPS Take 1 capsule by mouth at bedtime.    . metoCLOPramide (REGLAN) 10 MG tablet Take 10 mg by mouth 3 (three) times daily before meals.    Marland Kitchen morphine (MSIR) 15 MG tablet Take 15 mg by mouth every 4 (four) hours as needed (pain).     Marland Kitchen omeprazole (PRILOSEC) 40 MG capsule Take 40 mg by mouth 2 (two) times daily.     Marland Kitchen OVER THE COUNTER MEDICATION Take 1 capsule by mouth at bedtime. CBD oil    . OXYGEN Inhale 3 L into the lungs continuous.     . polyethylene glycol (MIRALAX / GLYCOLAX) packet Take 17 g by mouth daily. (Patient taking differently: Take 17 g by mouth at bedtime. Mix in 8 oz liquid and drink) 14 each 1  . potassium chloride SA (KLOR-CON) 20 MEQ tablet Take 20 mEq by mouth as directed. Monday, Wednesday, and Friday only    . predniSONE (DELTASONE) 10 MG tablet Take 10 mg by mouth daily with breakfast.    . prochlorperazine (COMPAZINE) 10 MG tablet Take 10 mg by mouth every 4 (four) hours as needed for  nausea or vomiting.     . rosuvastatin (CRESTOR) 10 MG tablet Take 10 mg by mouth at bedtime.     . senna (SENOKOT) 8.6 MG TABS tablet Take 2 tablets by mouth 2 (two) times daily.    . sildenafil (REVATIO) 20 MG tablet Take 1 tablet (20 mg total) by mouth 2 (two) times daily. 180 tablet 3  . Spacer/Aero-Holding Chambers (AEROCHAMBER MV) inhaler Use as instructed 1 each 0  . tamsulosin (FLOMAX) 0.4 MG CAPS capsule Take 1 capsule (0.4 mg total) by mouth daily after breakfast. 30 capsule 0  . temazepam (RESTORIL) 30 MG capsule Take 1 capsule (30 mg total) by mouth at bedtime. 30 capsule 5  . diltiazem (CARDIZEM) 30 MG tablet TAKE 1 TABLET BY MOUTH AS NEEDED FOR AFIB WITH HEART RATE ABOVE 110 FOR 30 MINUTES OR MORE 30 tablet 3   No current facility-administered medications for this visit.    Allergies:   Patient has no known allergies.    Social History:  The patient  reports that he quit smoking about 35 years ago. His smoking use included cigarettes. He smoked 0.00 packs per day for 0.00 years. He has never used smokeless tobacco. He reports that he does not drink alcohol and does not use drugs.   Family History:  The patient's family history includes Bone cancer in his brother; Cancer in his maternal uncle; Congestive Heart Failure in his maternal grandfather; Pancreatitis in his mother; Stroke in his maternal grandfather.    ROS:  Please see the history of present illness.   Otherwise, review of systems are positive for none.   All other systems are reviewed and negative.   PHYSICAL EXAM: BP 137/68   Ht 5\' 7"  (1.702 m)   Wt 135 lb (61.2 kg)   BMI 21.14 kg/m  GENERAL: Sounds well. No acute distress. RESP: Respirations unlabored. NEURO:  Speech fluent.  Cranial nerves grossly intact.  Moves all 4 extremities freely PSYCH:  Cognitively intact, oriented to person place and time  EKG:  EKG is not ordered today. The ekg ordered 09/22/17 demonstrates sinus rhythm.  Rate 61 bpm.   03/23/2018:  Sinus bradycardia.  Rate 53 bpm. 01/26/2019: Sinus rhythm.  PACs.  Rate 82 bpm.  Incomplete right bundle branch block.  Echo 09/10/17: Study Conclusions  - Left ventricle: The cavity size was normal. Wall thickness was normal. Systolic function was normal. The estimated ejection fraction was in the range of 55% to 60%. Wall motion was normal; there were no regional wall motion abnormalities. The study is not technically sufficient to allow evaluation of LV diastolic function. - Pulmonary arteries: PA peak pressure: 33 mm Hg (S). - Pericardium, extracardiac: A trivial pericardial effusion was identified.  Impressions:  - Technically difficult; no apical views; normal LV systolic function; trace MR and mild TR.   Recent Labs: 06/18/2020: ALT 20; Magnesium 2.6 07/10/2020: B Natriuretic Peptide 112.7 07/18/2020: BUN 25; Creatinine, Ser 1.02; Hemoglobin 13.3; Platelets 241; Potassium 3.3; Sodium 139   12/23/2017: Total cholesterol 117, triglycerides 107, HDL 59, LDL 37  Lipid Panel No results found for: CHOL, TRIG, HDL, CHOLHDL, VLDL, LDLCALC, LDLDIRECT    Wt Readings from Last 3 Encounters:  08/21/20 135 lb (61.2 kg)  07/25/20 130 lb (59 kg)  07/21/20 130 lb (59 kg)      ASSESSMENT AND PLAN:  # Paroxysmal atrial fibrillation:  Andrew [name] experienced a couple episodes of atrial fibrillation.  He has had a couple episodes of atrial fibrillation.  We will give him some diltiazem 30 mg tablet to take as needed for atrial fibrillation episodes with a heart rate greater than 110 and lasting more than 30 minutes.  Continue diltiazem 180 mg daily and Eliquis.  He remains on hospice.  He did have some hypokalemia when he was in the hospital.  Lasix was reduced to 20 mg due to his poor nutritional status.  His volume status seems stable with this.  Given that Lasix was reduced potassium was not increased.  We will recheck a BMP in a couple weeks.  # Pulmonary  hypertension:  Andrew [name] has been on sildenafil since 2007. PASP was only 33 mmHg on echo 09/10/17. This is odd given that he has significant pulmonary disease. Typically this medication would worsen V/Q mismatch in a patient with severe COPD.   However he has been stable so we will not stop it.  He had some improved with Lasix.  I am unable to examine him given that this is a telephone appointment.  He has been stable taking the lasix three days per week.  # Hyperlipidemia:  Continue rosuvastatin given his episodes of likely TIAs.    Current medicines are reviewed at length with the patient today.  The patient does not have concerns regarding medicines.  The following changes have been made:  none  Labs/ tests ordered today include:   Orders Placed This Encounter  Procedures  . Basic metabolic panel   [id number] Education: The signs and symptoms of COVID-19 were discussed with the patient and how to seek care for testing (follow up with PCP or arrange E-visit).  The importance of social distancing was discussed today.  Time:   Today, I have spent 25 minutes with the patient with telehealth technology discussing the above problems.     Disposition:   FU with Raejean Swinford C. Oval Linsey, MD, [facility] in 6 months   Signed, Haruye Lainez C. Oval Linsey, MD, [facility]  08/21/2020 10:54 AM    [place]

## 2020-08-21 NOTE — Telephone Encounter (Signed)
Left message for Almyra Free with [facility] hospice to call back.

## 2020-08-21 NOTE — Patient Instructions (Addendum)
Medication Instructions:  TALE DILTIAZEM 30 MG AS NEED FOR AFIB HEART RATE ABOVE 110 FOR 3O MINUTES OR MORE   *If you need a refill on your cardiac medications before your next appointment, please call your pharmacy*  Lab Work: BMET IN ABOUT 2 WEEKS   If you have labs (blood work) drawn today and your tests are completely normal, you will receive your results only by: Marland Kitchen MyChart Message (if you have MyChart) OR . A paper copy in the mail If you have any lab test that is abnormal or we need to change your treatment, we will call you to review the results.  Testing/Procedures: NONE  Follow-Up: At [facility], you and your health needs are our priority.  As part of our continuing mission to provide you with exceptional heart care, we have created designated Provider Care Teams.  These Care Teams include your primary Cardiologist (physician) and Advanced Practice Providers (APPs -  Physician Assistants and Nurse Practitioners) who all work together to provide you with the care you need, when you need it.  We recommend signing up for the patient portal called "MyChart".  Sign up information is provided on this After Visit Summary.  MyChart is used to connect with patients for Virtual Visits (Telemedicine).  Patients are able to view lab/test results, encounter notes, upcoming appointments, etc.  Non-urgent messages can be sent to your provider as well.   To learn more about what you can do with MyChart, go to [email or web address].    Your next appointment:   6 month(s)  You will receive a reminder letter in the mail two months in advance. If you don't receive a letter, please call our office to schedule the follow-up appointment.  The format for your next appointment:   In Person  Provider:   You may see Skeet Latch, MD or one of the following Advanced Practice Providers on your designated Care Team:    [name], PA-C  [place], [place]  Coletta Memos, [place]

## 2020-08-29 ENCOUNTER — Telehealth: Payer: Self-pay | Admitting: Pulmonary Disease

## 2020-08-29 DIAGNOSIS — Z23 Encounter for immunization: Secondary | ICD-10-CM | POA: Diagnosis not present

## 2020-08-29 NOTE — Telephone Encounter (Signed)
Called and spoke with pt stating to him that senokot is avail OTC and that I was not seeing Rx on file from Dr. Valeta Harms where it had been sent in for him. Pt said that hospice usually calls our office to have Dr. Valeta Harms fill meds that are usually handled by him. Pt said he has an Rx from 9/9 that shows that the senokot was sent to pharmacy after being prescribed by Dr. Valeta Harms.  Dr. Valeta Harms, please advise if you are okay with [place] sending Rx on senokot to pharmacy for pt and if so, what quantity should we prescribe.

## 2020-08-30 MED ORDER — SENNA 8.6 MG PO TABS: 2.0000 | ORAL_TABLET | Freq: Every day | ORAL | 12 refills | Status: AC | PRN

## 2020-08-30 NOTE — Telephone Encounter (Signed)
Refill was sent to [place]. Garner Nash, DO [facility] 08/30/2020 8:41 AM

## 2020-08-30 NOTE — Telephone Encounter (Signed)
Called and spoke with pt letting him know that Dr. Valeta Harms refilled his senna. Pt verbalized understanding. Nothing further needed.

## 2020-09-01 ENCOUNTER — Ambulatory Visit: Payer: Medicare Other | Admitting: Pulmonary Disease

## 2020-09-11 ENCOUNTER — Telehealth: Payer: Self-pay | Admitting: Pulmonary Disease

## 2020-09-11 DIAGNOSIS — J449 Chronic obstructive pulmonary disease, unspecified: Secondary | ICD-10-CM

## 2020-09-11 DIAGNOSIS — J9611 Chronic respiratory failure with hypoxia: Secondary | ICD-10-CM

## 2020-09-11 MED ORDER — AMOXICILLIN-POT CLAVULANATE 875-125 MG PO TABS: 1.0000 | ORAL_TABLET | Freq: Two times a day (BID) | ORAL | 0 refills | Status: DC

## 2020-09-11 MED ORDER — IPRATROPIUM-ALBUTEROL 0.5-2.5 (3) MG/3ML IN SOLN: 3.0000 mL | Freq: Four times a day (QID) | RESPIRATORY_TRACT | 3 refills | Status: AC | PRN

## 2020-09-11 MED ORDER — PREDNISONE 10 MG PO TABS: ORAL_TABLET | ORAL | 0 refills | Status: AC

## 2020-09-11 MED ORDER — AZITHROMYCIN 250 MG PO TABS: 250.0000 mg | ORAL_TABLET | ORAL | 2 refills | Status: DC

## 2020-09-11 NOTE — Telephone Encounter (Signed)
I assume Almyra Free is his hospice nurse?  Please start augmentin 875mg  BID x 7 days. Start prednisone 40mg  x 4 days and decrease by 10mg  daily every 4 days until stopped.   After he completes this course he can return to azithromycin 250mg  MWF, ok to refill for 90 day supply and 2 refills   Ok to refill duonebs q6h   Thanks  BLI

## 2020-09-11 NOTE — Telephone Encounter (Signed)
Called and spoke with Almyra Free who states that patient is having crackles in his lungs , shortness of breath, and coughing up brown mucus that started over the weekend(saturday). She states that at one point patients O2 dropped to 88% on 3 liters and then came back up after 30 minutes. She states that right now she hears some crackles in his Right lower lobe. Patient was on Azithromycin 3 times a week has finished it and has not refilled it and its been about a month since he has had any. They are not sure if he needs to continue it? Denies fevers  Patient is requesting refill on Duoneb but I don't see that on his med list but it is listed on AVS from [facility] on 07/25/20 with Aaron Edelman. Does he still need to be taking this? I only see Levalbuterol on med list  Dr. Valeta Harms please advise

## 2020-09-11 NOTE — Telephone Encounter (Signed)
Yes, Almyra Free is pt's hospice nurse.  Called and spoke with Almyra Free stating to her the info from Dr. Valeta Harms and she verbalized understanding. Refill for pt's azithromycin as well as duoneb sol have been sent to pharmacy for pt as well as Rx for pred taper and augmentin. Note to pharmacy for the azithromycin was placed that pt was to hold taking until finished with the augmentin.  Nothing further needed.

## 2020-10-17 ENCOUNTER — Telehealth: Payer: Self-pay | Admitting: Pulmonary Disease

## 2020-10-17 MED ORDER — AZITHROMYCIN 250 MG PO TABS: ORAL_TABLET | ORAL | 2 refills | Status: AC

## 2020-10-17 MED ORDER — AMOXICILLIN-POT CLAVULANATE 875-125 MG PO TABS: 1.0000 | ORAL_TABLET | Freq: Two times a day (BID) | ORAL | 0 refills | Status: AC

## 2020-10-17 NOTE — Telephone Encounter (Signed)
10/17/2020  Okay to hold Monday Wednesday Friday azithromycin.  Due to worsened symptoms can treat patient with:  Augmentin >>> Take 1 875-125 mg tablet every 12 hours for the next 10 days >>> Take with food  Please send in the order  When patient finishes the Augmentin as prescribed above then can resume Monday Wednesday Friday azithromycin.    Please also make sure patient has upcoming appointment with Dr. Valeta Harms sometime over the next 2 months  Wyn Quaker, FNP

## 2020-10-17 NOTE — Telephone Encounter (Signed)
lmtcb for pt.  

## 2020-10-17 NOTE — Telephone Encounter (Signed)
Primary Pulmonologist: [facility] Last office visit and with whom: 07/25/2020  Wyn Quaker NP What do we see them for (pulmonary problems): End stage COPD Last OV assessment/plan:  Assessment & Plan Note by [name], NP at 07/25/2020 1:18 PM Author: [name], NP Author Type: Nurse Practitioner Filed: 07/25/2020 1:19 PM  Note Status: Written Cosign: Cosign Not Required Encounter Date: 07/25/2020  Problem: Paroxysmal atrial fibrillation ([place])  Editor: [name], NP (Nurse Practitioner)                 Plan: Continue follow-up with cardiology    Patient Instructions by [name], NP at 07/25/2020 11:30 AM Author: [name], NP Author Type: Nurse Practitioner Filed: 07/25/2020 12:06 PM  Note Status: Addendum Cosign: Cosign Not Required Encounter Date: 07/25/2020  Editor: [name], NP (Nurse Practitioner)      Prior Versions: 1. [name], NP (Nurse Practitioner) at 07/25/2020 12:05 PM - Signed      You were seen today by [name], NP  for:   1. End stage COPD ([place])  Continue to work with home hospice  Continue oxygen therapy  We will obtain a CT of your chest  We will order a swallow study  Continue to use levalbuterol  Continue daily prednisone dose 10 mg  2. Chronic hypoxemic respiratory failure (H[facility])  Continue oxygen therapy as prescribed  >>>maintain oxygen saturations greater than 88 percent  >>>if unable to maintain oxygen saturations please contact the office  >>>do not smoke with oxygen  >>>can use nasal saline gel or nasal saline rinses to moisturize nose if oxygen causes dryness  3. Abnormal findings on diagnostic imaging of lung  - CT Chest Wo Contrast; Future  Need CT completed within the next 2 weeks to compare to July/2021 CT chest as well as abnormal x-ray in August/2021  4. Dysphagia, unspecified type  - SLP modified barium swallow; Future  As discussed today we will need to further evaluate our concerns that you may  have trouble swallowing  5. Paroxysmal atrial fibrillation (H[facility])  Continue follow-up with cardiology  6. Healthcare maintenance  Currently right now our recommendations are for patient to receiving daily prednisone less than 20 mg daily they do not require the Covid 19 booster  As discussed today likely this verbiage will change over the coming weeks.  If and when the COVID-19 booster is made available to you we would recommend it    We recommend today:  Orders Placed This Encounter  Procedures  . CT Chest Wo Contrast    Standing Status:   Future    Standing Expiration Date:   07/25/2021    Scheduling Instructions:     Within next 2 weeks    Order Specific Question:   Preferred imaging location?    Answer:   [facility]    Order Specific Question:   Radiology Contrast Protocol - do NOT remove file path    Answer:   [email or web address]  . SLP modified barium swallow    Standing Status:   Future    Standing Expiration Date:   07/25/2021    Scheduling Instructions:     Within next 2 weeks      Orders Placed This Encounter  Procedures  . CT Chest Wo Contrast  . SLP modified barium swallow   No orders of the defined types were placed in this encounter.   Follow Up:    Return in about  6 weeks (around 09/05/2020), or if symptoms worsen or fail to improve, for Follow up with Dr. Valeta Harms, After Chest CT.   Notification of test results are managed in the following manner: If there are  any recommendations or changes to the  plan of care discussed in office today,  we will contact you and let you know what they are. If you do not hear from [place], then your results are normal and you can view them through your  MyChart account , or a letter will be sent to you. Thank you again for trusting [place] with your care  - Thank you, [facility]    It is flu season:   >>> Best ways to protect herself  from the flu: Receive the yearly flu vaccine, practice good hand hygiene washing with soap and also using hand sanitizer when available, eat a nutritious meals, get adequate rest, hydrate appropriately       Please contact the office if your symptoms worsen or you have concerns that you are not improving.   Thank you for choosing [facility] for your healthcare, and for allowing [place] to partner with you on your healthcare journey. I am thankful to be able to provide care to you today.   Wyn Quaker FNP-C     Instructions    Return in about 6 weeks (around 09/05/2020), or if symptoms worsen or fail to improve, for Follow up with Dr. Valeta Harms, After Chest CT. You were seen today by [name], NP  for:   1. 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 Chronic hypoxemic respiratory failure (H[facility])  Continue oxygen therapy as prescribed  >>>maintain oxygen saturations greater than 88 percent  >>>if unable to maintain oxygen saturations please contact the office  >>>do not smoke with oxygen  >>>can use nasal saline gel or nasal saline rinses to moisturize nose if oxygen causes dryness  3. Abnormal findings on diagnostic imaging of lung  - CT Chest Wo Contrast; Future  Need CT completed within the next 2 weeks to compare to July/2021 CT chest as well as abnormal x-ray in August/2021  4. Dysphagia, unspecified type  - SLP modified barium swallow; Future  As discussed today we will need to further evaluate our concerns that you may have trouble swallowing  5. Paroxysmal atrial fibrillation (HCC)  Continue follow-up with cardiology  6. Healthcare maintenance  Currently right now our recommendations are for patient to receiving daily prednisone less than 20 mg  daily they do not require the Covid 19 booster  As discussed today likely this verbiage will change over the coming weeks.  If and when the COVID-19 booster is made available to you we would recommend it    We recommend today:        Orders Placed This Encounter  Procedures  . CT Chest Wo Contrast    Standing Status:   Future    Standing Expiration Date:   07/25/2021    Scheduling Instructions:     Within next 2 weeks    Order Specific Question:   Preferred imaging location?    Answer:   [facility]    Order Specific Question:   Radiology Contrast Protocol - do NOT remove file path  Answer:   [email or web address]  . SLP modified barium swallow    Standing Status:   Future    Standing Expiration Date:   07/25/2021    Scheduling Instructions:     Within next 2 weeks      Orders Placed This Encounter  Procedures  . CT Chest Wo Contrast  . SLP modified barium swallow   No orders of the defined types were placed in this encounter.   Follow Up:    Return in about 6 weeks (around 09/05/2020), or if symptoms worsen or fail to improve, for Follow up with Dr. Valeta Harms, After Chest CT.   Notification of test results are managed in the following manner: If there are  any recommendations or changes to the  plan of care discussed in office today,  we will contact you and let you know what they are. If you do not hear from [place], then your results are normal and you can view them through your  MyChart account , or a letter will be sent to you. Thank you again for trusting [place] with your care  - Thank you, [facility]    It is flu season:   >>> Best ways to protect herself from the flu: Receive the yearly flu vaccine, practice good hand hygiene washing with soap and also using hand sanitizer when available, eat a nutritious meals, get adequate rest, hydrate appropriately       Please contact  the office if your symptoms worsen or you have concerns that you are not improving.   Thank you for choosing [facility] for your healthcare, and for allowing [place] to partner with you on your healthcare journey. I am thankful to be able to provide care to you today.   Wyn Quaker FNP-C       Reason for call:Per Almyra Free (NP with Hospice),pt coughing up dark brown mucus, wanting to know if he needs to be on antibiotics again. Pt was on Azithromycin 3x a week.  Day before yesterday started coughing up dark brown mucus.  Denies any fever, chills, body aches. States a little bit of wheezing at times, using duonebs as needed.  States sob is at his baseline.  Fully vaccinated.  No sick contacts.  Wearing oxygen to keep sats >88%.  The Azithromycin was sent to [facility] mail order pharmacy 09/11/2020, however, I verified with the patient that he gets his prescriptions from [facility] since he has been under hospice care.  Aaron Edelman please advise on any recommendations.  (examples of things to ask: : When did symptoms start? Fever? Cough? Productive? Color to sputum? More sputum than usual? Wheezing? Have you needed increased oxygen? Are you taking your respiratory medications? What over the counter measures have you tried?)  No Known Allergies  Immunization History  Administered Date(s) Administered  . Fluad Quad(high Dose 65+) 07/20/2019  . Influenza, High Dose Seasonal PF 07/22/2016, 08/07/2017, 08/08/2018  . Influenza-Unspecified 09/19/2015  . PFIZER SARS-COV-2 Vaccination 01/16/2020, 02/16/2020  . PPD Test 02/15/2016  . Pneumococcal Conjugate-13 09/19/2015  . Pneumococcal Polysaccharide-23 12/20/2016  . Zoster Recombinat (Shingrix) 12/23/2017, 02/20/2018

## 2020-10-17 NOTE — Telephone Encounter (Signed)
Called and spoke with patient, advised of recommendations by Wyn Quaker NP.  Scripts sent to pharmacy after verifying pharmacy with patient.  Scheduled appointment with Dr. Valeta Harms for January 10th at 10:15 am, advised to arrive by 10 am for check in.  He verbalized understanding.  Script for Azithromycin sent to [facility] as patient does not use [facility] home delivery and never received original script sent to [facility] [place] [facility] on 09/11/2020.

## 2020-10-20 ENCOUNTER — Telehealth: Payer: Self-pay | Admitting: Pulmonary Disease

## 2020-10-20 NOTE — Telephone Encounter (Signed)
Spoke with pt's hospice nurse Almyra Free (unclear who Elita Quick is, the number listed rings to [place])- states that pt's daughter threw away his augmentin rx bottle sent in on 11/30 after putting it in his pill organizer, so she was asking for clarification on directions. I clarified directions with her.  Nothing further needed at this time- will close encounter.

## 2020-10-21 ENCOUNTER — Emergency Department (HOSPITAL_COMMUNITY)

## 2020-10-21 ENCOUNTER — Other Ambulatory Visit: Payer: Self-pay

## 2020-10-21 ENCOUNTER — Encounter (HOSPITAL_COMMUNITY): Payer: Self-pay | Admitting: Emergency Medicine

## 2020-10-21 ENCOUNTER — Inpatient Hospital Stay (HOSPITAL_COMMUNITY)
Admission: EM | Admit: 2020-10-21 | Discharge: 2020-11-18 | DRG: 190 | Disposition: E | Attending: Family Medicine | Admitting: Family Medicine

## 2020-10-21 DIAGNOSIS — Z515 Encounter for palliative care: Secondary | ICD-10-CM

## 2020-10-21 DIAGNOSIS — H9191 Unspecified hearing loss, right ear: Secondary | ICD-10-CM | POA: Diagnosis present

## 2020-10-21 DIAGNOSIS — R0902 Hypoxemia: Secondary | ICD-10-CM | POA: Diagnosis not present

## 2020-10-21 DIAGNOSIS — R1031 Right lower quadrant pain: Secondary | ICD-10-CM

## 2020-10-21 DIAGNOSIS — G8929 Other chronic pain: Secondary | ICD-10-CM | POA: Diagnosis present

## 2020-10-21 DIAGNOSIS — R1084 Generalized abdominal pain: Secondary | ICD-10-CM | POA: Diagnosis not present

## 2020-10-21 DIAGNOSIS — Z8249 Family history of ischemic heart disease and other diseases of the circulatory system: Secondary | ICD-10-CM

## 2020-10-21 DIAGNOSIS — K5900 Constipation, unspecified: Secondary | ICD-10-CM | POA: Diagnosis present

## 2020-10-21 DIAGNOSIS — J441 Chronic obstructive pulmonary disease with (acute) exacerbation: Principal | ICD-10-CM | POA: Diagnosis present

## 2020-10-21 DIAGNOSIS — N401 Enlarged prostate with lower urinary tract symptoms: Secondary | ICD-10-CM | POA: Diagnosis present

## 2020-10-21 DIAGNOSIS — Z7952 Long term (current) use of systemic steroids: Secondary | ICD-10-CM

## 2020-10-21 DIAGNOSIS — Z66 Do not resuscitate: Secondary | ICD-10-CM | POA: Diagnosis present

## 2020-10-21 DIAGNOSIS — G4733 Obstructive sleep apnea (adult) (pediatric): Secondary | ICD-10-CM | POA: Diagnosis present

## 2020-10-21 DIAGNOSIS — E785 Hyperlipidemia, unspecified: Secondary | ICD-10-CM | POA: Diagnosis present

## 2020-10-21 DIAGNOSIS — R1310 Dysphagia, unspecified: Secondary | ICD-10-CM | POA: Diagnosis present

## 2020-10-21 DIAGNOSIS — R338 Other retention of urine: Secondary | ICD-10-CM | POA: Diagnosis present

## 2020-10-21 DIAGNOSIS — I10 Essential (primary) hypertension: Secondary | ICD-10-CM | POA: Diagnosis present

## 2020-10-21 DIAGNOSIS — J9621 Acute and chronic respiratory failure with hypoxia: Secondary | ICD-10-CM | POA: Diagnosis not present

## 2020-10-21 DIAGNOSIS — Z79899 Other long term (current) drug therapy: Secondary | ICD-10-CM

## 2020-10-21 DIAGNOSIS — Z87891 Personal history of nicotine dependence: Secondary | ICD-10-CM

## 2020-10-21 DIAGNOSIS — Z809 Family history of malignant neoplasm, unspecified: Secondary | ICD-10-CM

## 2020-10-21 DIAGNOSIS — I4891 Unspecified atrial fibrillation: Secondary | ICD-10-CM | POA: Diagnosis not present

## 2020-10-21 DIAGNOSIS — Z7982 Long term (current) use of aspirin: Secondary | ICD-10-CM

## 2020-10-21 DIAGNOSIS — I480 Paroxysmal atrial fibrillation: Secondary | ICD-10-CM | POA: Diagnosis present

## 2020-10-21 DIAGNOSIS — Z9981 Dependence on supplemental oxygen: Secondary | ICD-10-CM

## 2020-10-21 DIAGNOSIS — K5792 Diverticulitis of intestine, part unspecified, without perforation or abscess without bleeding: Secondary | ICD-10-CM | POA: Diagnosis not present

## 2020-10-21 DIAGNOSIS — I2721 Secondary pulmonary arterial hypertension: Secondary | ICD-10-CM | POA: Diagnosis present

## 2020-10-21 DIAGNOSIS — R042 Hemoptysis: Secondary | ICD-10-CM | POA: Diagnosis present

## 2020-10-21 DIAGNOSIS — K5732 Diverticulitis of large intestine without perforation or abscess without bleeding: Secondary | ICD-10-CM | POA: Diagnosis present

## 2020-10-21 DIAGNOSIS — Z20822 Contact with and (suspected) exposure to covid-19: Secondary | ICD-10-CM | POA: Diagnosis present

## 2020-10-21 DIAGNOSIS — Z8673 Personal history of transient ischemic attack (TIA), and cerebral infarction without residual deficits: Secondary | ICD-10-CM

## 2020-10-21 DIAGNOSIS — B379 Candidiasis, unspecified: Secondary | ICD-10-CM | POA: Diagnosis present

## 2020-10-21 DIAGNOSIS — Z823 Family history of stroke: Secondary | ICD-10-CM

## 2020-10-21 DIAGNOSIS — R10829 Rebound abdominal tenderness, unspecified site: Secondary | ICD-10-CM | POA: Diagnosis present

## 2020-10-21 DIAGNOSIS — K219 Gastro-esophageal reflux disease without esophagitis: Secondary | ICD-10-CM | POA: Diagnosis present

## 2020-10-21 DIAGNOSIS — J449 Chronic obstructive pulmonary disease, unspecified: Secondary | ICD-10-CM | POA: Diagnosis present

## 2020-10-21 LAB — CBC WITH DIFFERENTIAL/PLATELET
Abs Immature Granulocytes: 0.03 10*3/uL (ref 0.00–0.07)
Basophils Absolute: 0 10*3/uL (ref 0.0–0.1)
Basophils Relative: 0 %
Eosinophils Absolute: 0 10*3/uL (ref 0.0–0.5)
Eosinophils Relative: 0 %
HCT: 41.7 % (ref 39.0–52.0)
Hemoglobin: 13.5 g/dL (ref 13.0–17.0)
Immature Granulocytes: 0 %
Lymphocytes Relative: 4 %
Lymphs Abs: 0.5 10*3/uL — ABNORMAL LOW (ref 0.7–4.0)
MCH: 32.5 pg (ref 26.0–34.0)
MCHC: 32.4 g/dL (ref 30.0–36.0)
MCV: 100.2 fL — ABNORMAL HIGH (ref 80.0–100.0)
Monocytes Absolute: 0.8 10*3/uL (ref 0.1–1.0)
Monocytes Relative: 7 %
Neutro Abs: 11.2 10*3/uL — ABNORMAL HIGH (ref 1.7–7.7)
Neutrophils Relative %: 89 %
Platelets: 186 10*3/uL (ref 150–400)
RBC: 4.16 MIL/uL — ABNORMAL LOW (ref 4.22–5.81)
RDW: 12.6 % (ref 11.5–15.5)
WBC: 12.6 10*3/uL — ABNORMAL HIGH (ref 4.0–10.5)
nRBC: 0 % (ref 0.0–0.2)

## 2020-10-21 LAB — COMPREHENSIVE METABOLIC PANEL
ALT: 31 U/L (ref 0–44)
AST: 24 U/L (ref 15–41)
Albumin: 4.3 g/dL (ref 3.5–5.0)
Alkaline Phosphatase: 69 U/L (ref 38–126)
Anion gap: 14 (ref 5–15)
BUN: 22 mg/dL (ref 8–23)
CO2: 26 mmol/L (ref 22–32)
Calcium: 9.2 mg/dL (ref 8.9–10.3)
Chloride: 99 mmol/L (ref 98–111)
Creatinine, Ser: 0.96 mg/dL (ref 0.61–1.24)
GFR, Estimated: >60 mL/min (ref >60)
Glucose, Bld: 122 mg/dL — ABNORMAL HIGH (ref 70–99)
Potassium: 4.6 mmol/L (ref 3.5–5.1)
Sodium: 139 mmol/L (ref 135–145)
Total Bilirubin: 0.9 mg/dL (ref 0.3–1.2)
Total Protein: 6.9 g/dL (ref 6.5–8.1)

## 2020-10-21 LAB — LIPASE, BLOOD: Lipase: 18 U/L (ref 11–51)

## 2020-10-21 MED ORDER — HYDROMORPHONE HCL 1 MG/ML IJ SOLN
1.0000 mg | Freq: Once | INTRAMUSCULAR | Status: AC
  Administered 2020-10-21: 1 mg via INTRAVENOUS
  Filled 2020-10-21: qty 1

## 2020-10-21 MED ORDER — DILTIAZEM LOAD VIA INFUSION
10.0000 mg | Freq: Once | INTRAVENOUS | Status: AC
  Administered 2020-10-21: 10 mg via INTRAVENOUS
  Filled 2020-10-21: qty 10

## 2020-10-21 MED ORDER — DILTIAZEM HCL-DEXTROSE 125-5 MG/125ML-% IV SOLN (PREMIX)
5.0000 mg/h | INTRAVENOUS | Status: DC
  Administered 2020-10-21: 5 mg/h via INTRAVENOUS
  Filled 2020-10-21 (×2): qty 125

## 2020-10-21 NOTE — ED Triage Notes (Addendum)
Per EMS, patient from home, c/o abdominal pain with new onset rebound tenderness over the last three days, per hospice nurse at house. Denies N/V/D. Hx chronic abdominal pain and constipation.  3L [facility] at baseline. Hx COPD

## 2020-10-21 NOTE — ED Provider Notes (Signed)
[facility] DEPT Provider Note   CSN: [id number] Arrival date & time: 11/05/2020  2106     History Chief Complaint  Patient presents with  . Abdominal Pain    Andrew [name] is a 79 y.o. male.  HPI Patient presents with abdominal pain.  Reportedly has chronic abdominal pain.  States they have been unable to find a cause.  Has had constipation previously.  He is on hospice for end-stage COPD.  Reportedly the hospice nurse sent him in for new rebound tenderness on the right abdomen.  No fevers.  No chest pain.  No dysuria.  Has some constipation but states he got a rectal exam today did not show stool in the vault.    Past Medical History:  Diagnosis Date  . Anemia   . BiPAP (biphasic positive airway pressure) dependence    Pt denies history of OSA  . BPH (benign prostatic hyperplasia)   . Cancer ([place])    skin  . COPD (chronic obstructive pulmonary disease) ([place])   . Dysphagia   . Emphysema lung ([place])   . GERD (gastroesophageal reflux disease)    " Silent reflux"  . Hearing loss    right ear  . History of hiatal hernia   . HLD (hyperlipidemia)   . Hypertension   . Oxygen dependent    2-3 liters  . Oxygen dependent   . Pneumonia   . Pulmonary hypertension ([place])   . Sleep apnea    wears cpap  . TIA (transient ischemic attack) 04/27/2020    Patient Active Problem List   Diagnosis Date Noted  . Abnormal findings on diagnostic imaging of lung 07/25/2020  . Candidal balanitis 06/17/2020  . Dyspnea   . COPD exacerbation ([place]) 05/28/2020  . Palliative care by specialist   . Goals of care, counseling/discussion   . DNR (do not resuscitate)   . TIA (transient ischemic attack) 04/27/2020  . Hospice care patient 03/27/2020  . Palliative care patient 03/27/2020  . Abdominal distention 02/10/2020  . Chronic anticoagulation 09/22/2019  . Healthcare maintenance 07/20/2019  . Immunosuppressed status ([place]) 12/03/2018  . Current chronic use of  systemic steroids 12/03/2018  . Iron deficiency anemia, unspecified 04/20/2018  . PVCs (premature ventricular contractions) 02/02/2018  . Influenza-like illness 01/17/2018  . History of pneumonia 12/16/2017  . OSA on CPAP 09/22/2017  . Paroxysmal atrial fibrillation ([facility])   . Malnutrition of moderate degree 09/10/2017  . Protein-calorie malnutrition, severe 12/09/2016  . Dysphagia 08/23/2016  . Normocytic anemia 06/28/2016  . Pressure sore on buttocks 06/28/2016  . Pulmonary hypertension ([place]) 02/20/2016  . GERD (gastroesophageal reflux disease) 02/20/2016  . COPD with acute exacerbation ([place]) 02/13/2016  . Acute on chronic respiratory failure with hypoxia ([place]) 02/12/2016  . End stage COPD ([place]) 02/11/2016  . Hypertension 02/11/2016  . Hyperlipidemia 02/11/2016  . Pneumonia of both lower lobes due to infectious organism   . BPH (benign prostatic hyperplasia) 12/29/2015  . Chronic hypoxemic respiratory failure ([place]) 10/02/2015  . History of pulmonary hypertension 10/02/2015    Past Surgical History:  Procedure Laterality Date  . CARDIAC CATHETERIZATION    . CATARACT EXTRACTION W/ INTRAOCULAR LENS  IMPLANT, BILATERAL    . COLONOSCOPY WITH PROPOFOL N/A 04/20/2018   Procedure: COLONOSCOPY WITH PROPOFOL;  Surgeon: Wilford Corner, MD;  Location: [facility];  Service: Endoscopy;  Laterality: N/A;  . ESOPHAGOGASTRODUODENOSCOPY (EGD) WITH PROPOFOL N/A 08/23/2016   Procedure: ESOPHAGOGASTRODUODENOSCOPY (EGD) WITH PROPOFOL;  Surgeon: Wilford Corner, MD;  Location: [facility];  Service: Endoscopy;  Laterality: N/A;  . ESOPHAGOGASTRODUODENOSCOPY (EGD) WITH PROPOFOL N/A 04/20/2018   Procedure: ESOPHAGOGASTRODUODENOSCOPY (EGD) WITH PROPOFOL;  Surgeon: Wilford Corner, MD;  Location: [facility];  Service: Endoscopy;  Laterality: N/A;  . HOT HEMOSTASIS N/A 04/20/2018   Procedure: HOT HEMOSTASIS (ARGON PLASMA COAGULATION/BICAP);  Surgeon: Wilford Corner, MD;  Location: [facility];   Service: Endoscopy;  Laterality: N/A;  . LUNG SURGERY    . POLYPECTOMY  04/20/2018   Procedure: POLYPECTOMY;  Surgeon: Wilford Corner, MD;  Location: [facility] ENDOSCOPY;  Service: Endoscopy;;  . TONSILLECTOMY         Family History  Problem Relation Age of Onset  . Cancer Maternal Uncle   . Pancreatitis Mother   . Bone cancer Brother   . Stroke Maternal Grandfather   . Congestive Heart Failure Maternal Grandfather     Social History   Tobacco Use  . Smoking status: Former Smoker    Packs/day: 0.00    Years: 0.00    Pack years: 0.00    Types: Cigarettes    Quit date: 10/01/1984    Years since quitting: 36.0  . Smokeless tobacco: Never Used  Vaping Use  . Vaping Use: Never used  Substance Use Topics  . Alcohol use: No    Alcohol/week: 0.0 standard drinks  . Drug use: No    Home Medications Prior to Admission medications   Medication Sig Start Date End Date Taking? Authorizing Provider  amoxicillin-clavulanate (AUGMENTIN) 875-125 MG tablet Take 1 tablet by mouth 2 (two) times daily. 10/17/20   Garner Nash, DO  Ascorbic Acid (VITAMIN C) 1000 MG tablet Take 1,000 mg by mouth at bedtime.    [provider]  aspirin EC 81 MG tablet Take 1 tablet (81 mg total) by mouth daily. 07/14/20   Icard, Octavio Graves, DO  azithromycin (ZITHROMAX) 250 MG tablet Monday-Wednesday-Friday after finishing Augmentin. 10/17/20   Garner Nash, DO  Cholecalciferol (VITAMIN D) 50 MCG (2000 UT) tablet Take 2,000 Units by mouth at bedtime.    [provider]  diltiazem (CARDIZEM CD) 180 MG 24 hr capsule TAKE 1 CAPSULE EVERY DAY (NEEDS OFFICE VISIT FOR FURTHER REFILLS) Patient taking differently: Take 180 mg by mouth at bedtime.  02/15/20   Skeet Latch, MD  diltiazem (CARDIZEM) 30 MG tablet TAKE 1 TABLET BY MOUTH AS NEEDED FOR AFIB WITH HEART RATE ABOVE 110 FOR 30 MINUTES OR MORE 08/21/20   Skeet Latch, MD  finasteride (PROSCAR) 5 MG tablet Take 5 mg by mouth at bedtime.      [provider]  furosemide (LASIX) 20 MG tablet Take 1 tablet (20 mg total) by mouth every Monday, Wednesday, and Friday. 07/21/20   Lendon Colonel, NP  guaiFENesin (MUCINEX) 600 MG 12 hr tablet Take 1 tablet (600 mg total) by mouth 2 (two) times daily. 09/12/17   Lavina Hamman, MD  ipratropium-albuterol (DUONEB) 0.5-2.5 (3) MG/3ML SOLN Take 3 mLs by nebulization every 6 (six) hours as needed. 09/11/20   Icard, Octavio Graves, DO  levalbuterol (XOPENEX) 0.63 MG/3ML nebulizer solution SMARTSIG:3 Milliliter(s) Via Nebulizer Every 4 Hours PRN 07/14/20   [provider]  LORazepam (ATIVAN) 0.5 MG tablet Take 0.5 mg by mouth every 4 (four) hours as needed for anxiety.  05/25/20   [provider]  Melatonin 10 MG CAPS Take 1 capsule by mouth at bedtime. 06/13/20   [provider]  metoCLOPramide (REGLAN) 10 MG tablet Take 10 mg by mouth 3 (three) times daily before meals.  05/22/20   [provider]  morphine (MSIR) 15 MG tablet Take 15 mg by mouth every 4 (four) hours as needed (pain).     [provider]  omeprazole (PRILOSEC) 40 MG capsule Take 40 mg by mouth 2 (two) times daily.     [provider]  OVER THE COUNTER MEDICATION Take 1 capsule by mouth at bedtime. CBD oil    [provider]  OXYGEN Inhale 3 L into the lungs continuous.     [provider]  polyethylene glycol (MIRALAX / GLYCOLAX) packet Take 17 g by mouth daily. Patient taking differently: Take 17 g by mouth at bedtime. Mix in 8 oz liquid and drink 05/08/16   Mesner, Corene Cornea, MD  potassium chloride SA (KLOR-CON) 20 MEQ tablet Take 20 mEq by mouth as directed. Monday, Wednesday, and Friday only    [provider]  predniSONE (DELTASONE) 10 MG tablet Take 10 mg by mouth daily with breakfast.    [provider]  prochlorperazine (COMPAZINE) 10 MG tablet Take 10 mg by mouth every 4 (four) hours as needed for nausea or vomiting.  05/15/20   [provider]  rosuvastatin (CRESTOR) 10 MG tablet Take 10 mg by mouth at bedtime.     [provider]  senna (SENOKOT) 8.6 MG TABS tablet Take 2 tablets (17.2 mg total) by mouth daily as needed for mild constipation. 08/30/20   Icard, Leory Plowman L, DO  sildenafil (REVATIO) 20 MG tablet Take 1 tablet (20 mg total) by mouth 2 (two) times daily. 03/12/19   Fenton Foy, NP  Spacer/Aero-Holding Chambers (AEROCHAMBER MV) inhaler Use as instructed 12/03/18   Icard, Octavio Graves, DO  tamsulosin (FLOMAX) 0.4 MG CAPS capsule Take 1 capsule (0.4 mg total) by mouth daily after breakfast. 01/03/16   Nita Sells, MD  temazepam (RESTORIL) 30 MG capsule Take 1 capsule (30 mg total) by mouth at bedtime. 07/09/18   Noralee Space, MD    Allergies    Patient has no known allergies.  Review of Systems   Review of Systems  Constitutional: Negative for appetite change.  HENT: Negative for congestion.   Respiratory: Negative for shortness of breath.   Cardiovascular: Negative for chest pain.  Gastrointestinal: Positive for abdominal pain and constipation. Negative for diarrhea, nausea and vomiting.  Genitourinary: Negative for flank pain.  Musculoskeletal: Negative for back pain.  Skin: Negative for rash.  Neurological: Negative for weakness.  Psychiatric/Behavioral: Negative for confusion.    Physical Exam Updated Vital Signs BP 130/78   Pulse (!) 118   Temp 98 F (36.7 C) (Oral)   Resp (!) 21   SpO2 98%   Physical Exam Vitals and nursing note reviewed.  HENT:     Head: Atraumatic.  Cardiovascular:     Rate and Rhythm: Regular rhythm. Tachycardia present.  Pulmonary:     Comments: Nasal cannula in place.  Pursed lip breathing Abdominal:     Hernia: No hernia is present.     Comments: Right mid abdominal tenderness.  No mass felt.  No guarding.  Skin:    General: Skin is warm.     Capillary Refill: Capillary refill takes less than 2 seconds.  Neurological:     Mental Status:  He is alert and oriented to person, place, and time.     ED Results / Procedures / Treatments   Labs (all labs ordered are listed, but only abnormal results are displayed) Labs Reviewed  CBC WITH DIFFERENTIAL/PLATELET - Abnormal;  Notable for the following components:      Result Value   WBC 12.6 (*)    RBC 4.16 (*)    MCV 100.2 (*)    Neutro Abs 11.2 (*)    Lymphs Abs 0.5 (*)    All other components within normal limits  COMPREHENSIVE METABOLIC PANEL - Abnormal; Notable for the following components:   Glucose, Bld 122 (*)    All other components within normal limits  LIPASE, BLOOD    EKG EKG Interpretation  Date/Time:  Saturday October 21 2020 21:43:58 EST Ventricular Rate:  125 PR Interval:    QRS Duration: 97 QT Interval:  297 QTC Calculation: 429 R Axis:   58 Text Interpretation: Atrial fibrillation Ventricular premature complex Anteroseptal infarct, old Borderline ST depression, lateral leads Confirmed by Davonna Belling [phone]) on 11/13/2020 10:50:51 PM   Radiology DG Abdomen Acute W/Chest  Result Date: 10/23/2020 CLINICAL DATA:  Abdominal pain, tenderness EXAM: DG ABDOMEN ACUTE WITH 1 VIEW CHEST COMPARISON:  02/11/2020 FINDINGS: Supine and upright frontal views of the abdomen as well as an upright frontal view of the chest are obtained. The cardiac silhouette is unremarkable. Upper lobe predominant bullous emphysema with bibasilar scarring again noted. No airspace disease, effusion, or pneumothorax. Bowel gas pattern is unremarkable without obstruction or ileus. No free gas in the greater peritoneal sac. No abdominal masses or abnormal calcifications. No acute bony abnormalities. IMPRESSION: 1. Unremarkable bowel gas pattern. 2. Emphysema.  No acute airspace disease. Electronically Signed   By: Randa Ngo M.D.   On: 11/10/2020 22:13    Procedures Procedures (including critical care time)  Medications Ordered in ED Medications  diltiazem (CARDIZEM) 1 mg/mL  load via infusion 10 mg (has no administration in time range)    And  diltiazem (CARDIZEM) 125 mg in dextrose 5% 125 mL (1 mg/mL) infusion (has no administration in time range)  HYDROmorphone (DILAUDID) injection 1 mg (1 mg Intravenous Given 10/29/2020 2208)  HYDROmorphone (DILAUDID) injection 1 mg (1 mg Intravenous Given 10/30/2020 2331)    ED Course  I have reviewed the triage vital signs and the nursing notes.  Pertinent labs & imaging results that were available during my care of the patient were reviewed by me and considered in my medical decision making (see chart for details).    MDM Rules/Calculators/A&P                         Chadsvasc at least 4.  Patient presents with acute on chronic abdominal pain.  End-stage COPD on hospice.  Came in because he states his abdomen was hurting more and that his caregiver told him that he now had rebound.  This was caregiver known to the patient who is previously felt his abdomen.  Right-sided tenderness.  Lab work overall reassuring and reviewed.  Does have tenderness.  Infection considered as also with A. fib a mesenteric ischemia.  Also constipation could be a cause.  X-ray reassuring.  Will get CTA to evaluate the abdomen.  Also found to be in A. fib with RVR.  History of A. fib.  From what I can see he does not appear to be anticoagulated although I do not see reason why.  Started on Cardizem drip to help control the rate.  Likely require admission to the hospital due to need for rate control.  Care turned over to Dr. Roxanne Mins.   CRITICAL CARE Performed by: Davonna Belling Total critical care time: 30 minutes Critical  care time was exclusive of separately billable procedures and treating other patients. Critical care was necessary to treat or prevent imminent or life-threatening deterioration. Critical care was time spent personally by me on the following activities: development of treatment plan with patient and/or surrogate as well as nursing,  discussions with consultants, evaluation of patient's response to treatment, examination of patient, obtaining history from patient or surrogate, ordering and performing treatments and interventions, ordering and review of laboratory studies, ordering and review of radiographic studies, pulse oximetry and re-evaluation of patient's condition.  Final Clinical Impression(s) / ED Diagnoses Final diagnoses:  Right lower quadrant abdominal pain  Atrial fibrillation with rapid ventricular response [facility])    Rx / DC Orders ED Discharge Orders    None       Davonna Belling, MD 10/26/2020 2341

## 2020-10-22 ENCOUNTER — Emergency Department (HOSPITAL_COMMUNITY)

## 2020-10-22 ENCOUNTER — Encounter (HOSPITAL_COMMUNITY): Payer: Self-pay

## 2020-10-22 DIAGNOSIS — H9191 Unspecified hearing loss, right ear: Secondary | ICD-10-CM | POA: Diagnosis present

## 2020-10-22 DIAGNOSIS — K5732 Diverticulitis of large intestine without perforation or abscess without bleeding: Secondary | ICD-10-CM | POA: Diagnosis present

## 2020-10-22 DIAGNOSIS — Z809 Family history of malignant neoplasm, unspecified: Secondary | ICD-10-CM | POA: Diagnosis not present

## 2020-10-22 DIAGNOSIS — Z20822 Contact with and (suspected) exposure to covid-19: Secondary | ICD-10-CM | POA: Diagnosis present

## 2020-10-22 DIAGNOSIS — J9621 Acute and chronic respiratory failure with hypoxia: Secondary | ICD-10-CM | POA: Diagnosis present

## 2020-10-22 DIAGNOSIS — R10829 Rebound abdominal tenderness, unspecified site: Secondary | ICD-10-CM | POA: Diagnosis present

## 2020-10-22 DIAGNOSIS — J441 Chronic obstructive pulmonary disease with (acute) exacerbation: Secondary | ICD-10-CM | POA: Diagnosis present

## 2020-10-22 DIAGNOSIS — K5792 Diverticulitis of intestine, part unspecified, without perforation or abscess without bleeding: Secondary | ICD-10-CM | POA: Diagnosis not present

## 2020-10-22 DIAGNOSIS — Z87891 Personal history of nicotine dependence: Secondary | ICD-10-CM | POA: Diagnosis not present

## 2020-10-22 DIAGNOSIS — R042 Hemoptysis: Secondary | ICD-10-CM

## 2020-10-22 DIAGNOSIS — K219 Gastro-esophageal reflux disease without esophagitis: Secondary | ICD-10-CM | POA: Diagnosis present

## 2020-10-22 DIAGNOSIS — G4733 Obstructive sleep apnea (adult) (pediatric): Secondary | ICD-10-CM | POA: Diagnosis present

## 2020-10-22 DIAGNOSIS — G8929 Other chronic pain: Secondary | ICD-10-CM | POA: Diagnosis present

## 2020-10-22 DIAGNOSIS — I10 Essential (primary) hypertension: Secondary | ICD-10-CM | POA: Diagnosis present

## 2020-10-22 DIAGNOSIS — Z515 Encounter for palliative care: Secondary | ICD-10-CM | POA: Diagnosis not present

## 2020-10-22 DIAGNOSIS — Z66 Do not resuscitate: Secondary | ICD-10-CM | POA: Diagnosis present

## 2020-10-22 DIAGNOSIS — E785 Hyperlipidemia, unspecified: Secondary | ICD-10-CM | POA: Diagnosis present

## 2020-10-22 DIAGNOSIS — Z8673 Personal history of transient ischemic attack (TIA), and cerebral infarction without residual deficits: Secondary | ICD-10-CM | POA: Diagnosis not present

## 2020-10-22 DIAGNOSIS — Z823 Family history of stroke: Secondary | ICD-10-CM | POA: Diagnosis not present

## 2020-10-22 DIAGNOSIS — I4891 Unspecified atrial fibrillation: Secondary | ICD-10-CM | POA: Diagnosis not present

## 2020-10-22 DIAGNOSIS — Z7952 Long term (current) use of systemic steroids: Secondary | ICD-10-CM

## 2020-10-22 DIAGNOSIS — R338 Other retention of urine: Secondary | ICD-10-CM | POA: Diagnosis present

## 2020-10-22 DIAGNOSIS — N401 Enlarged prostate with lower urinary tract symptoms: Secondary | ICD-10-CM | POA: Diagnosis present

## 2020-10-22 DIAGNOSIS — Z8249 Family history of ischemic heart disease and other diseases of the circulatory system: Secondary | ICD-10-CM | POA: Diagnosis not present

## 2020-10-22 DIAGNOSIS — I480 Paroxysmal atrial fibrillation: Secondary | ICD-10-CM | POA: Diagnosis present

## 2020-10-22 DIAGNOSIS — Z9981 Dependence on supplemental oxygen: Secondary | ICD-10-CM | POA: Diagnosis not present

## 2020-10-22 DIAGNOSIS — B379 Candidiasis, unspecified: Secondary | ICD-10-CM | POA: Diagnosis present

## 2020-10-22 DIAGNOSIS — J449 Chronic obstructive pulmonary disease, unspecified: Secondary | ICD-10-CM

## 2020-10-22 LAB — CBC WITH DIFFERENTIAL/PLATELET
Abs Immature Granulocytes: 0.12 10*3/uL — ABNORMAL HIGH (ref 0.00–0.07)
Basophils Absolute: 0 10*3/uL (ref 0.0–0.1)
Basophils Relative: 0 %
Eosinophils Absolute: 0 10*3/uL (ref 0.0–0.5)
Eosinophils Relative: 0 %
HCT: 41.2 % (ref 39.0–52.0)
Hemoglobin: 13.1 g/dL (ref 13.0–17.0)
Immature Granulocytes: 1 %
Lymphocytes Relative: 1 %
Lymphs Abs: 0.2 10*3/uL — ABNORMAL LOW (ref 0.7–4.0)
MCH: 32.5 pg (ref 26.0–34.0)
MCHC: 31.8 g/dL (ref 30.0–36.0)
MCV: 102.2 fL — ABNORMAL HIGH (ref 80.0–100.0)
Monocytes Absolute: 0.9 10*3/uL (ref 0.1–1.0)
Monocytes Relative: 4 %
Neutro Abs: 24.3 10*3/uL — ABNORMAL HIGH (ref 1.7–7.7)
Neutrophils Relative %: 94 %
Platelets: 198 10*3/uL (ref 150–400)
RBC: 4.03 MIL/uL — ABNORMAL LOW (ref 4.22–5.81)
RDW: 12.6 % (ref 11.5–15.5)
WBC: 25.6 10*3/uL — ABNORMAL HIGH (ref 4.0–10.5)
nRBC: 0 % (ref 0.0–0.2)

## 2020-10-22 LAB — RESP PANEL BY RT-PCR (FLU A&B, COVID) ARPGX2
Influenza A by PCR: NEGATIVE
Influenza B by PCR: NEGATIVE
SARS Coronavirus 2 by RT PCR: NEGATIVE

## 2020-10-22 LAB — MRSA PCR SCREENING: MRSA by PCR: NEGATIVE

## 2020-10-22 MED ORDER — METHYLPREDNISOLONE SODIUM SUCC 40 MG IJ SOLR
40.0000 mg | Freq: Two times a day (BID) | INTRAMUSCULAR | Status: DC
  Administered 2020-10-22 – 2020-10-24 (×5): 40 mg via INTRAVENOUS
  Filled 2020-10-22 (×5): qty 1

## 2020-10-22 MED ORDER — PIPERACILLIN-TAZOBACTAM 3.375 G IVPB 30 MIN
3.3750 g | Freq: Once | INTRAVENOUS | Status: AC
  Administered 2020-10-22: 3.375 g via INTRAVENOUS
  Filled 2020-10-22: qty 50

## 2020-10-22 MED ORDER — LORAZEPAM 0.5 MG PO TABS: 0.5000 mg | ORAL_TABLET | ORAL | Status: DC | PRN

## 2020-10-22 MED ORDER — ORAL CARE MOUTH RINSE
15.0000 mL | Freq: Two times a day (BID) | OROMUCOSAL | Status: DC
  Administered 2020-10-22 – 2020-10-27 (×10): 15 mL via OROMUCOSAL

## 2020-10-22 MED ORDER — DILTIAZEM HCL 25 MG/5ML IV SOLN: 15.0000 mg | Freq: Once | INTRAVENOUS | Status: DC

## 2020-10-22 MED ORDER — MELATONIN 5 MG PO TABS
10.0000 mg | ORAL_TABLET | Freq: Every day | ORAL | Status: DC
  Administered 2020-10-22: 10 mg via ORAL
  Filled 2020-10-22: qty 2

## 2020-10-22 MED ORDER — SILDENAFIL CITRATE 20 MG PO TABS
20.0000 mg | ORAL_TABLET | Freq: Two times a day (BID) | ORAL | Status: DC
  Administered 2020-10-22 – 2020-10-23 (×2): 20 mg via ORAL
  Filled 2020-10-22 (×3): qty 1

## 2020-10-22 MED ORDER — MORPHINE SULFATE (PF) 2 MG/ML IV SOLN
2.0000 mg | INTRAVENOUS | Status: DC | PRN
  Administered 2020-10-22: 2 mg via INTRAVENOUS
  Administered 2020-10-22: 4 mg via INTRAVENOUS
  Administered 2020-10-22: 2 mg via INTRAVENOUS
  Filled 2020-10-22 (×3): qty 1
  Filled 2020-10-22: qty 2

## 2020-10-22 MED ORDER — MORPHINE SULFATE 15 MG PO TABS
15.0000 mg | ORAL_TABLET | ORAL | Status: DC | PRN
  Administered 2020-10-22 – 2020-10-23 (×2): 15 mg via ORAL
  Filled 2020-10-22 (×2): qty 1

## 2020-10-22 MED ORDER — PIPERACILLIN-TAZOBACTAM 3.375 G IVPB
3.3750 g | Freq: Three times a day (TID) | INTRAVENOUS | Status: DC
  Administered 2020-10-22 – 2020-10-24 (×6): 3.375 g via INTRAVENOUS
  Filled 2020-10-22 (×6): qty 50

## 2020-10-22 MED ORDER — IOHEXOL 350 MG/ML SOLN
100.0000 mL | Freq: Once | INTRAVENOUS | Status: AC | PRN
  Administered 2020-10-22: 100 mL via INTRAVENOUS

## 2020-10-22 MED ORDER — ONDANSETRON HCL 4 MG/2ML IJ SOLN: 4.0000 mg | Freq: Four times a day (QID) | INTRAMUSCULAR | Status: DC | PRN

## 2020-10-22 MED ORDER — IPRATROPIUM-ALBUTEROL 0.5-2.5 (3) MG/3ML IN SOLN
3.0000 mL | Freq: Once | RESPIRATORY_TRACT | Status: AC
  Administered 2020-10-22: 3 mL via RESPIRATORY_TRACT
  Filled 2020-10-22: qty 3

## 2020-10-22 MED ORDER — TEMAZEPAM 15 MG PO CAPS
30.0000 mg | ORAL_CAPSULE | Freq: Every day | ORAL | Status: DC
  Administered 2020-10-22: 30 mg via ORAL
  Filled 2020-10-22: qty 2

## 2020-10-22 MED ORDER — CHLORHEXIDINE GLUCONATE CLOTH 2 % EX PADS
6.0000 | MEDICATED_PAD | Freq: Every day | CUTANEOUS | Status: DC
  Administered 2020-10-23: 6 via TOPICAL

## 2020-10-22 MED ORDER — MORPHINE 100MG IN NS 100ML (1MG/ML) PREMIX INFUSION
1.0000 mg/h | INTRAVENOUS | Status: DC
  Filled 2020-10-22 (×2): qty 100

## 2020-10-22 MED ORDER — ACETAMINOPHEN 650 MG RE SUPP
650.0000 mg | Freq: Four times a day (QID) | RECTAL | Status: DC | PRN
  Administered 2020-10-26 – 2020-10-28 (×3): 650 mg via RECTAL
  Filled 2020-10-22 (×3): qty 1

## 2020-10-22 MED ORDER — DILTIAZEM HCL 30 MG PO TABS
30.0000 mg | ORAL_TABLET | Freq: Four times a day (QID) | ORAL | Status: DC
  Administered 2020-10-22 – 2020-10-23 (×3): 30 mg via ORAL
  Filled 2020-10-22 (×4): qty 1

## 2020-10-22 MED ORDER — ONDANSETRON 4 MG PO TBDP: 4.0000 mg | ORAL_TABLET | Freq: Four times a day (QID) | ORAL | Status: DC | PRN

## 2020-10-22 MED ORDER — LEVALBUTEROL HCL 0.63 MG/3ML IN NEBU: 0.6300 mg | INHALATION_SOLUTION | Freq: Four times a day (QID) | RESPIRATORY_TRACT | Status: DC | PRN

## 2020-10-22 MED ORDER — DILTIAZEM LOAD VIA INFUSION
15.0000 mg | Freq: Once | INTRAVENOUS | Status: AC
  Administered 2020-10-22: 15 mg via INTRAVENOUS
  Filled 2020-10-22: qty 15

## 2020-10-22 MED ORDER — ACETAMINOPHEN 325 MG PO TABS: 650.0000 mg | ORAL_TABLET | Freq: Four times a day (QID) | ORAL | Status: DC | PRN

## 2020-10-22 MED ORDER — LORAZEPAM 2 MG/ML IJ SOLN
0.5000 mg | INTRAMUSCULAR | Status: DC | PRN
  Administered 2020-10-22 – 2020-10-23 (×4): 0.5 mg via INTRAVENOUS
  Filled 2020-10-22 (×4): qty 1

## 2020-10-22 MED ORDER — TRANEXAMIC ACID FOR EPISTAXIS
500.0000 mg | Freq: Once | TOPICAL | Status: AC
  Administered 2020-10-22: 500 mg via TOPICAL
  Filled 2020-10-22: qty 10

## 2020-10-22 MED ORDER — IPRATROPIUM-ALBUTEROL 0.5-2.5 (3) MG/3ML IN SOLN: 3.0000 mL | Freq: Four times a day (QID) | RESPIRATORY_TRACT | Status: DC | PRN

## 2020-10-22 MED ORDER — MORPHINE BOLUS VIA INFUSION
1.0000 mg | INTRAVENOUS | Status: DC | PRN
  Filled 2020-10-22: qty 1

## 2020-10-22 NOTE — Progress Notes (Signed)
Patient is 79 year old male with history of end-stage COPD on hospice, oxygen/steroid-dependent, pulmonary artery hypertension, paroxysmal A. fib who presents to the emergency department with complaints of abdominal pain.  As per the report, he has chronic abdominal pain but the pain recently exacerbated.  No history of nausea, vomiting or diarrhea.  On presentation he was found to have A. fib with RVR.  While on CT scan for the evaluation of abdominal pain, he had frank bright red blood hemoptysis resulting in severe worsening of respiratory status and had to be kept on nonrebreather with later transitioned to BiPAP. CT abdomen/pelvis showed mild acute diverticulitis on the descending colon.  Patient was started on antibiotics. Patient seen and examined at the bedside this morning.  During my evaluation, he had converted to normal sinus rhythm and his heart rate was well controlled.  We stopped the Cardizem drip and started on short acting Cardizem tablet.  He was alert and oriented and was maintaining saturation on BiPAP.  His abdomen was soft and nontender and was nondistended.  His hemoptysis has resolved and his hemoglobin has remained stable though he has developed leukocytosis. Extensive discussion held with the daughter at the bedside.  Our goal is to make him comfortable and the plan is to send him home back with hospice in 1 to 2 days.  If he declines, family agreeable for comfort care or residential hospice. Since his abdomen was benign on examination, will start on full liquid diet.  We will give a break on BiPAP,put him back on nasal cannula  and continue current management. Patient seen by Dr. Alcario Drought this morning.

## 2020-10-22 NOTE — H&P (Signed)
History and Physical    Andrew [name] CHE:[id number] DOB: 11/27/1940 DOA: 11/02/2020  PCP: Lujean Amel, MD  Patient coming from: Home  I have personally briefly reviewed patient's old medical records in [place]  Chief Complaint: Abd pain  HPI: Andrew [name] is a 79 y.o. 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  Pt presents to the ED with c/o abd pain.  Pain in R abdomen.  Severe.  Apparently ongoing for some time now but now has new onset rebound TTP over past 3 days.  No N/V/D.  Has h/o chronic abd pain and constipation.  Sent in to ED.  No fevers, chills, chest pain, leg swelling.   ED Course: While in the ED pt noted to have A.Fib RVR, this was rate controlled first with cardizem gtt, pt then sent to CT scan,  While in the CT scanner however, he had sudden onset of frank bright red blood hemoptysis.  Severe worsening of respiratory status, satting 79% initially on NRB.  Pt given nebulized TXA to try and stop the bleeding.  Pt respiratory status improved somewhat to 93% on NRB.  Due to work of breathing pt put on BIPAP and given morphine.  This seems to have worked and vitals now stabilized for the moment.  Ultimately the abd pain determined to be due to uncomplicated diverticulitis on CT results.  Review of Systems: As per HPI, otherwise all review of systems negative.  Past Medical History:  Diagnosis Date  . Anemia   . BiPAP (biphasic positive airway pressure) dependence    Pt denies history of OSA  . BPH (benign prostatic hyperplasia)   . Cancer ([place])    skin  . COPD (chronic obstructive pulmonary disease) ([place])   . Dysphagia   . Emphysema lung ([place])   . GERD (gastroesophageal reflux disease)    " Silent reflux"  . Hearing loss    right ear  . History of hiatal hernia   . HLD (hyperlipidemia)   . Hypertension   . Oxygen dependent    2-3 liters  . Oxygen dependent   . Pneumonia   . Pulmonary  hypertension ([place])   . Sleep apnea    wears cpap  . TIA (transient ischemic attack) 04/27/2020    Past Surgical History:  Procedure Laterality Date  . CARDIAC CATHETERIZATION    . CATARACT EXTRACTION W/ INTRAOCULAR LENS  IMPLANT, BILATERAL    . COLONOSCOPY WITH PROPOFOL N/A 04/20/2018   Procedure: COLONOSCOPY WITH PROPOFOL;  Surgeon: Wilford Corner, MD;  Location: [facility];  Service: Endoscopy;  Laterality: N/A;  . ESOPHAGOGASTRODUODENOSCOPY (EGD) WITH PROPOFOL N/A 08/23/2016   Procedure: ESOPHAGOGASTRODUODENOSCOPY (EGD) WITH PROPOFOL;  Surgeon: Wilford Corner, MD;  Location: [facility] ENDOSCOPY;  Service: Endoscopy;  Laterality: N/A;  . ESOPHAGOGASTRODUODENOSCOPY (EGD) WITH PROPOFOL N/A 04/20/2018   Procedure: ESOPHAGOGASTRODUODENOSCOPY (EGD) WITH PROPOFOL;  Surgeon: Wilford Corner, MD;  Location: [facility];  Service: Endoscopy;  Laterality: N/A;  . HOT HEMOSTASIS N/A 04/20/2018   Procedure: HOT HEMOSTASIS (ARGON PLASMA COAGULATION/BICAP);  Surgeon: Wilford Corner, MD;  Location: [facility];  Service: Endoscopy;  Laterality: N/A;  . LUNG SURGERY    . POLYPECTOMY  04/20/2018   Procedure: POLYPECTOMY;  Surgeon: Wilford Corner, MD;  Location: [facility] ENDOSCOPY;  Service: Endoscopy;;  . TONSILLECTOMY       reports that he quit smoking about 36 years ago. His smoking use included cigarettes. He smoked 0.00 packs per day for 0.00 years. He  has never used smokeless tobacco. He reports that he does not drink alcohol and does not use drugs.  No Known Allergies  Family History  Problem Relation Age of Onset  . Cancer Maternal Uncle   . Pancreatitis Mother   . Bone cancer Brother   . Stroke Maternal Grandfather   . Congestive Heart Failure Maternal Grandfather      Prior to Admission medications   Medication Sig Start Date End Date Taking? Authorizing Provider  amoxicillin-clavulanate (AUGMENTIN) 875-125 MG tablet Take 1 tablet by mouth 2 (two) times daily. 10/17/20   Garner Nash,  DO  Ascorbic Acid (VITAMIN C) 1000 MG tablet Take 1,000 mg by mouth at bedtime.    [provider]  aspirin EC 81 MG tablet Take 1 tablet (81 mg total) by mouth daily. 07/14/20   Icard, Octavio Graves, DO  azithromycin (ZITHROMAX) 250 MG tablet Monday-Wednesday-Friday after finishing Augmentin. 10/17/20   Garner Nash, DO  Cholecalciferol (VITAMIN D) 50 MCG (2000 UT) tablet Take 2,000 Units by mouth at bedtime.    [provider]  diltiazem (CARDIZEM CD) 180 MG 24 hr capsule TAKE 1 CAPSULE EVERY DAY (NEEDS OFFICE VISIT FOR FURTHER REFILLS) Patient taking differently: Take 180 mg by mouth at bedtime.  02/15/20   Skeet Latch, MD  diltiazem (CARDIZEM) 30 MG tablet TAKE 1 TABLET BY MOUTH AS NEEDED FOR AFIB WITH HEART RATE ABOVE 110 FOR 30 MINUTES OR MORE 08/21/20   Skeet Latch, MD  finasteride (PROSCAR) 5 MG tablet Take 5 mg by mouth at bedtime.     [provider]  furosemide (LASIX) 20 MG tablet Take 1 tablet (20 mg total) by mouth every Monday, Wednesday, and Friday. 07/21/20   Lendon Colonel, NP  guaiFENesin (MUCINEX) 600 MG 12 hr tablet Take 1 tablet (600 mg total) by mouth 2 (two) times daily. 09/12/17   Lavina Hamman, MD  ipratropium-albuterol (DUONEB) 0.5-2.5 (3) MG/3ML SOLN Take 3 mLs by nebulization every 6 (six) hours as needed. 09/11/20   Icard, Octavio Graves, DO  levalbuterol (XOPENEX) 0.63 MG/3ML nebulizer solution SMARTSIG:3 Milliliter(s) Via Nebulizer Every 4 Hours PRN 07/14/20   [provider]  LORazepam (ATIVAN) 0.5 MG tablet Take 0.5 mg by mouth every 4 (four) hours as needed for anxiety.  05/25/20   [provider]  Melatonin 10 MG CAPS Take 1 capsule by mouth at bedtime. 06/13/20   [provider]  metoCLOPramide (REGLAN) 10 MG tablet Take 10 mg by mouth 3 (three) times daily before meals. 05/22/20   [provider]  morphine (MSIR) 15 MG tablet Take 15 mg by mouth every 4 (four) hours as needed (pain).     [provider]  omeprazole (PRILOSEC) 40 MG capsule Take 40 mg by mouth 2 (two) times daily.     [provider]  OVER THE COUNTER MEDICATION Take 1 capsule by mouth at bedtime. CBD oil    [provider]  OXYGEN Inhale 3 L into the lungs continuous.     [provider]  polyethylene glycol (MIRALAX / GLYCOLAX) packet Take 17 g by mouth daily. Patient taking differently: Take 17 g by mouth at bedtime. Mix in 8 oz liquid and drink 05/08/16   Mesner, Corene Cornea, MD  potassium chloride SA (KLOR-CON) 20 MEQ tablet Take 20 mEq by mouth as directed. Monday, Wednesday, and Friday only    [provider]  predniSONE (DELTASONE) 10 MG tablet Take 10 mg by mouth daily with breakfast.  [provider]  prochlorperazine (COMPAZINE) 10 MG tablet Take 10 mg by mouth every 4 (four) hours as needed for nausea or vomiting.  05/15/20   [provider]  rosuvastatin (CRESTOR) 10 MG tablet Take 10 mg by mouth at bedtime.     [provider]  senna (SENOKOT) 8.6 MG TABS tablet Take 2 tablets (17.2 mg total) by mouth daily as needed for mild constipation. 08/30/20   Icard, Leory Plowman L, DO  sildenafil (REVATIO) 20 MG tablet Take 1 tablet (20 mg total) by mouth 2 (two) times daily. 03/12/19   Fenton Foy, NP  Spacer/Aero-Holding Chambers (AEROCHAMBER MV) inhaler Use as instructed 12/03/18   Icard, Octavio Graves, DO  tamsulosin (FLOMAX) 0.4 MG CAPS capsule Take 1 capsule (0.4 mg total) by mouth daily after breakfast. 01/03/16   Nita Sells, MD  temazepam (RESTORIL) 30 MG capsule Take 1 capsule (30 mg total) by mouth at bedtime. 07/09/18   Noralee Space, MD    Physical Exam: Vitals:   10/22/20 0135 10/22/20 0230 10/22/20 0310 10/22/20 0345  BP: 126/76 (!) 135/92 138/89 130/81  Pulse: (!) 115 (!) 143 (!) 149 (!) 104  Resp:  (!) 34 (!) 23 18  Temp:      TempSrc:      SpO2: (!) 79% 93% 97% 100%    Constitutional: ill appearing, on BIPAP. Eyes: PERRL,  lids and conjunctivae normal ENMT: Mucous membranes are moist. Posterior pharynx clear of any exudate or lesions.Normal dentition.  Neck: normal, supple, no masses, no thyromegaly Respiratory: clear to auscultation bilaterally, no wheezing, no crackles. Normal respiratory effort. No accessory muscle use.  Cardiovascular: Regular rate and rhythm, no murmurs / rubs / gallops. No extremity edema. 2+ pedal pulses. No carotid bruits.  Abdomen: no tenderness, no masses palpated. No hepatosplenomegaly. Bowel sounds positive.  Musculoskeletal: no clubbing / cyanosis. No joint deformity upper and lower extremities. Good ROM, no contractures. Normal muscle tone.  Skin: no rashes, lesions, ulcers. No induration Neurologic: CN 2-12 grossly intact. Sensation intact, DTR normal. Strength 5/5 in all 4.  Psychiatric: Normal judgment and insight. Alert and oriented x 3. Normal mood.    Labs on Admission: I have personally reviewed following labs and imaging studies  CBC: Recent Labs  Lab 10/20/2020 2213  WBC 12.6*  NEUTROABS 11.2*  HGB 13.5  HCT 41.7  MCV 100.2*  PLT 161   Basic Metabolic Panel: Recent Labs  Lab 11/10/2020 2213  NA 139  K 4.6  CL 99  CO2 26  GLUCOSE 122*  BUN 22  CREATININE 0.96  CALCIUM 9.2   GFR: CrCl cannot be calculated (Unknown ideal weight.). Liver Function Tests: Recent Labs  Lab 10/31/2020 2213  AST 24  ALT 31  ALKPHOS 69  BILITOT 0.9  PROT 6.9  ALBUMIN 4.3   Recent Labs  Lab 10/24/2020 2213  LIPASE 18   No results for input(s): AMMONIA in the last 168 hours. Coagulation Profile: No results for input(s): INR, PROTIME in the last 168 hours. Cardiac Enzymes: No results for input(s): CKTOTAL, CKMB, CKMBINDEX, TROPONINI in the last 168 hours. BNP (last 3 results) No results for input(s): PROBNP in the last 8760 hours. HbA1C: No results for input(s): HGBA1C in the last 72 hours. CBG: No results for input(s): GLUCAP in the last 168 hours. Lipid  Profile: No results for input(s): CHOL, HDL, LDLCALC, TRIG, CHOLHDL, LDLDIRECT in the last 72 hours. Thyroid Function Tests: No results for input(s): TSH, T4TOTAL, FREET4, T3FREE, THYROIDAB in the  last 72 hours. Anemia Panel: No results for input(s): VITAMINB12, FOLATE, FERRITIN, TIBC, IRON, RETICCTPCT in the last 72 hours. Urine analysis:    Component Value Date/Time   COLORURINE YELLOW 06/17/2020 2156   APPEARANCEUR CLEAR 06/17/2020 2156   LABSPEC 1.015 06/17/2020 2156   PHURINE 7.0 06/17/2020 2156   GLUCOSEU 50 (A) 06/17/2020 2156   HGBUR NEGATIVE 06/17/2020 2156   [place] NEGATIVE 06/17/2020 2156   [place] NEGATIVE 06/17/2020 2156   PROTEINUR NEGATIVE 06/17/2020 2156   NITRITE NEGATIVE 06/17/2020 2156   LEUKOCYTESUR NEGATIVE 06/17/2020 2156    Radiological Exams on Admission: DG Chest 1 View  Result Date: 10/22/2020 CLINICAL DATA:  Coughing up blood EXAM: CHEST  1 VIEW COMPARISON:  07/10/2020 FINDINGS: Severe emphysematous changes are noted. Atelectasis and scarring is noted at the lung bases. There is no pneumothorax. The heart size is unremarkable. There is no acute displaced fracture. There are old healed right-sided rib fractures. IMPRESSION: Severe emphysematous changes. No acute cardiopulmonary process. Electronically Signed   By: Constance Holster M.D.   On: 10/22/2020 02:11   DG Abdomen Acute W/Chest  Result Date: 11/01/2020 CLINICAL DATA:  Abdominal pain, tenderness EXAM: DG ABDOMEN ACUTE WITH 1 VIEW CHEST COMPARISON:  02/11/2020 FINDINGS: Supine and upright frontal views of the abdomen as well as an upright frontal view of the chest are obtained. The cardiac silhouette is unremarkable. Upper lobe predominant bullous emphysema with bibasilar scarring again noted. No airspace disease, effusion, or pneumothorax. Bowel gas pattern is unremarkable without obstruction or ileus. No free gas in the greater peritoneal sac. No abdominal masses or abnormal calcifications. No  acute bony abnormalities. IMPRESSION: 1. Unremarkable bowel gas pattern. 2. Emphysema.  No acute airspace disease. Electronically Signed   By: Randa Ngo M.D.   On: 10/22/2020 22:13   CT Angio Abd/Pel W and/or Wo Contrast  Result Date: 10/22/2020 CLINICAL DATA:  Concern for mesenteric ischemia.  Abdominal pain. EXAM: CTA ABDOMEN AND PELVIS WITHOUT AND WITH CONTRAST TECHNIQUE: Multidetector CT imaging of the abdomen and pelvis was performed using the standard protocol during bolus administration of intravenous contrast. Multiplanar reconstructed images and MIPs were obtained and reviewed to evaluate the vascular anatomy. CONTRAST:  145mL OMNIPAQUE IOHEXOL 350 MG/ML SOLN COMPARISON:  CT dated 02/17/2020 FINDINGS: VASCULAR Aorta: There are atherosclerotic changes of the abdominal aorta without evidence for an aneurysm or dissection. Celiac: There is mild narrowing at the origin of the celiac axis. SMA: Patent without evidence of aneurysm, dissection, vasculitis or significant stenosis. Renals: Both renal arteries are patent without evidence of aneurysm, dissection, vasculitis, fibromuscular dysplasia or significant stenosis. There are 2 right renal arteries. IMA: Patent without evidence of aneurysm, dissection, vasculitis or significant stenosis. Inflow: Patent without evidence of aneurysm, dissection, vasculitis or significant stenosis. Proximal Outflow: Bilateral common femoral and visualized portions of the superficial and profunda femoral arteries are patent without evidence of aneurysm, dissection, vasculitis or significant stenosis. Veins: No obvious venous abnormality within the limitations of this arterial phase study. Review of the MIP images confirms the above findings. NON-VASCULAR Lower chest: Severe emphysematous changes are noted. Mucous plugging is again noted involving the right lower lobe. Hepatobiliary: There is a small hyperattenuating focus in the right hepatic lobe measuring approximately 8  mm (axial series 7, image 39). This is favored to represent a flash hemangioma. Normal gallbladder.There is no biliary ductal dilation. Pancreas: Normal contours without ductal dilatation. No peripancreatic fluid collection. Spleen: There are multiple calcifications throughout the patient's spleen. Adrenals/Urinary Tract: --Adrenal glands: Unremarkable. --  Right kidney/ureter: No hydronephrosis or radiopaque kidney stones. --Left kidney/ureter: No hydronephrosis or radiopaque kidney stones. --Urinary bladder: Unremarkable. Stomach/Bowel: --Stomach/Duodenum: No hiatal hernia or other gastric abnormality. Normal duodenal course and caliber. --Small bowel: Unremarkable. --Colon: There are scattered colonic diverticula. There is mild fat stranding about several diverticula involving the distal descending colon (axial series 7, image 137). --Appendix: Normal. Lymphatic: --again noted is a relatively stable enhancing lymph node adjacent to the left renal artery. This is not significantly changed from prior study. --No mesenteric lymphadenopathy. --No pelvic or inguinal lymphadenopathy. Reproductive: The prostate gland is enlarged. Other: No ascites or free air. The abdominal wall is normal. Musculoskeletal. 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 IMPRESSION: 1. No CT evidence for mesenteric ischemia. 2. There is mild fat stranding about several diverticula involving the distal descending colon, suggestive of mild acute uncomplicated diverticulitis in the appropriate clinical setting. 3. Persistent mucous plugging involving the right lower lobe. 4. Prostatomegaly. Aortic Atherosclerosis (ICD10-I70.0). Electronically Signed   By: Constance Holster M.D.   On: 10/22/2020 01:14    EKG: Independently reviewed.  Assessment/Plan Principal Problem:   Acute on chronic respiratory failure with hypoxia (HCC) Active Problems:   End stage COPD (HCC)   Current chronic use  of systemic steroids   Hospice care patient   Major hemoptysis   Acute diverticulitis   Atrial fibrillation with RVR ([place])    1. Acute on chronic resp failure with hypoxia - 1. Due to acute major hemoptysis on top of baseline end stage COPD 1. Not clear what caused the hemoptysis; however, bleeding seems to have stopped / improved for the moment after inhaled TXA neb treatment. 2. Further work up on this later today if pt continues to remain stable. 2. Patient stabilized on rescue BIPAP 3. Work of breathing improved after IV morphine 4. Pt and daughter confirmed DNI status 2. End stage COPD - 1. Adult wheeze protocol 2. Cont home PRN and scheduled nebs 3. Converting steroid to IV for the moment (solumedrol 40 Q12H) since he is on BIPAP 4. Cont revatio if he can take this in AM 3. Hospice care - 1. Converting PRN morphine and ativan to IV 4. A.Fib RVR - 1. Rate controlled on cardizem gtt 2. Holding home PO meds 3. Not on anticoagulation it looks like, which is just as well with major hemoptysis today. 5. Diverticulitis - uncomplicated 1. Zosyn IV  DVT prophylaxis: SCDs Code Status: DNR/DNI - confirmed with daughter Family Communication: Daughter at bedside Disposition Plan: TBD Consults called: None Admission status: Admit to inpatient  Severity of Illness: The appropriate patient status for this patient is INPATIENT. Inpatient status is judged to be reasonable and necessary in order to provide the required intensity of service to ensure the patient's safety. The patient's presenting symptoms, physical exam findings, and initial radiographic and laboratory data in the context of their chronic comorbidities is felt to place them at high risk for further clinical deterioration. Furthermore, it is not anticipated that the patient will be medically stable for discharge from the hospital within 2 midnights of admission. The following factors support the patient status of inpatient.    IP status due to acute on chronic resp failure with hypoxia.  On rescue BIPAP.  A.Fib RVR on cardizem gtt.  * I certify that at the point of admission it is my clinical judgment that the patient will require inpatient hospital care spanning beyond 2 midnights from  the point of admission due to high intensity of service, high risk for further deterioration and high frequency of surveillance required.*    Andrew [name] M. DO [facility]  How to contact the [facility] Attending or Consulting provider [place] or covering provider during after hours [place], for this patient?  1. Check the care team in [facility] and look for a) attending/consulting [facility] provider listed and b) the [facility] team listed 2. Log into [email or web address]  Amion Physician Scheduling and messaging for groups and whole hospitals  On call and physician scheduling software for group practices, residents, hospitalists and other medical providers for call, clinic, rotation and shift schedules. OnCall [employer] is a hospital-wide system for scheduling doctors and paging doctors on call. EasyPlot is for scientific plotting and data analysis.  [email or web address]  and use [place]'s universal password to access. If you do not have the password, please contact the hospital operator.  3. Locate the [facility] provider you are looking for under [facility] and page to a number that you can be directly reached. 4. If you still have difficulty reaching the provider, please page the [facility] (Director on Call) for the Hospitalists listed on amion for assistance.  10/22/2020, 3:59 AM

## 2020-10-22 NOTE — Progress Notes (Signed)
[facility])  Andrew [name] is our current hospice patient with a terminal diagnosis of COPD.   Decisions are guided by the pt and his dtr Andrew [name].  Please reach out for any questions or concerns.  Venia Carbon RN, BSN, [place] [facility] Liaison

## 2020-10-22 NOTE — Progress Notes (Signed)
Pt. transported uneventfully from ED to ICU.

## 2020-10-22 NOTE — Progress Notes (Signed)
Pharmacy Antibiotic Note  Andrew [name] is a 79 y.o. male admitted on 10/22/2020 with intra-abdominal infection.  Pharmacy has been consulted for Zosyn dosing.  CrCl>39ml/min  Plan: Zosyn 3.375g IV q8h (4 hour infusion).  No dose adjustments anticipated. Pharmacy will sign off.  Please re-consult as needed.      Temp (24hrs), Avg:98 F (36.7 C), Min:98 F (36.7 C), Max:98 F (36.7 C)  Recent Labs  Lab 10/25/2020 2213  WBC 12.6*  CREATININE 0.96    CrCl cannot be calculated (Unknown ideal weight.).    No Known Allergies  Thank you for allowing pharmacy to be a part of this patient's care.  Netta Cedars PharmD 10/22/2020 3:01 AM

## 2020-10-22 NOTE — Plan of Care (Signed)
  Problem: Respiratory: Goal: Ability to maintain a clear airway will improve Outcome: Progressing   Problem: Respiratory: Goal: Levels of oxygenation will improve Outcome: Progressing   Problem: Respiratory: Goal: Ability to maintain adequate ventilation will improve Outcome: Progressing   Problem: Activity: Goal: Ability to tolerate increased activity will improve Outcome: Progressing   Problem: Cardiac: Goal: Ability to achieve and maintain adequate cardiopulmonary perfusion will improve Outcome: Progressing

## 2020-10-22 NOTE — Progress Notes (Signed)
RN called and said the pt needed to go back on bipap. I asked the nurse if he could get soom medicine to help him relax . Rn said she would get him something. RT Placed Pt back on bipap 12/7 and 60%. RT will continue to monitor

## 2020-10-22 NOTE — ED Notes (Signed)
Report called to Ander Purpura, RN on 2W

## 2020-10-22 NOTE — Progress Notes (Signed)
[name] [address] [facility]) Hospitalized Hospice Patient  Andrew [name] is a hospice patient with a terminal diagnosis of COPD. He was complaining of increasing abdominal pain and increasing SOB. The family decided to send him to the ED for further evaluation. Hospice was notified once he arrived at the ED. He is admitted with acute on chronic respiratory failure. This is a related hospital admission.  Pt placed on BiPAP upon arrival to the ED last night. He went to CT for CT angiogram of his abdomen and pelvis and had an episode of hemoptysis and further hypoxia.  Today, he has been on and off BiPAP. Currently on O2 via [place] salter at 15 lpm. Discussed at length with his daughter that he may not improve from this episode as he has in the past. She understands his disease trajectory and wants to focus on comfort measures. Discussed that the preference is for him to return home to pass. Advised that we would do whatever we could to safely d/c him back home. Currently, plan is to stabilize and admit him to inpatient.   V/S: 143/68, HR 83, RR 21, SPO2 98% on 15 lpm [place] Labs: WBC 25.6, RBC 4.03 Diagnostics:  -Chest xray - Severe emphysematous changes are noted. Atelectasis and scarring is noted at the lung bases -CT abd/pelvis - mild acute uncomplicated diverticulitis  Problem List: - acute on chronic respiratory failure - end stage COPD, stabilized on BiPAP now on O2 15 lpm via [place] - end stage COPD - under hospice care  GOC: clear, optimize comfort. Family is flying in from out of town. Family states the goal is to go home to pass. Advised we will support that anyway we can as long as he is stable to discharge. D/C planning: ongoing, family would like to return home, will depend on if he stabilizes Family: present and updated  IDT: hospice team updated  Venia Carbon RN, BSN, [place] [facility] Liaison

## 2020-10-22 NOTE — Progress Notes (Signed)
Placed ptr on 12Lsalter sats were 94-98. Pt has a lot of anxiety and RT increaseed his oxygen to 15L. Pt sia he thought he could do the salter for a little bit. The Pt take  ativan at home and may need that to help with his anxiety. RT will continue to monitor.

## 2020-10-22 NOTE — ED Provider Notes (Signed)
Care assumed from Andrew [name].  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  Also, noted to be in atrial fibrillation with rapid ventricular response.  On return from CT, patient coughed up a large amount of bright red blood and was noted to be hypoxic.  He is placed on a nonrebreather mask and is still only maintaining oxygen saturations in the 80s.  Lungs have coarse rhonchi.  He is not on any systemic anticoagulants, only aspirin.  We will give tranexamic acid via nebulizer to try to stop his bleeding.  He continues to be tachycardic, will give additional diltiazem.  There has been no further bleeding.  He continues to be hypoxic in spite of being maintained on nonrebreather mask.  Lungs continue to have coarse rhonchi.  I suspect hypoxia is due to his some of blood having gotten into the lower respiratory tract.  Will check chest x-ray.  Daughter has arrived and I have confirmed DNR/DNI status.  Will aim for comfort care.  CT is suggestive of possible mild uncomplicated diverticulitis.  Will start on antibiotics and he will need to be admitted.  Case is discussed with Dr. Alcario Drought of [facility], who agrees to admit the patient.  CRITICAL CARE Performed by: Delora Fuel Total critical care time: 60 minutes Critical care time was exclusive of separately billable procedures and treating other patients. Critical care was necessary to treat or prevent imminent or life-threatening deterioration. Critical care was time spent personally by me on the following activities: development of treatment plan with patient and/or surrogate as well as nursing, discussions with consultants, evaluation of patient's response to treatment, examination of patient, obtaining history from patient or surrogate, ordering and performing treatments and interventions, ordering and review of laboratory studies, ordering and review of radiographic studies,  pulse oximetry and re-evaluation of patient's condition.  Results for orders placed or performed during the hospital encounter of 11/03/2020  CBC with Differential  Result Value Ref Range   WBC 12.6 (H) 4.0 - 10.5 K/uL   RBC 4.16 (L) 4.22 - 5.81 MIL/uL   Hemoglobin 13.5 13.0 - 17.0 g/dL   HCT 41.7 39 - 52 %   MCV 100.2 (H) 80.0 - 100.0 fL   MCH 32.5 26.0 - 34.0 pg   MCHC 32.4 30.0 - 36.0 g/dL   RDW 12.6 11.5 - 15.5 %   Platelets 186 150 - 400 K/uL   nRBC 0.0 0.0 - 0.2 %   Neutrophils Relative % 89 %   Neutro Abs 11.2 (H) 1.7 - 7.7 K/uL   Lymphocytes Relative 4 %   Lymphs Abs 0.5 (L) 0.7 - 4.0 K/uL   Monocytes Relative 7 %   Monocytes Absolute 0.8 0.1 - 1.0 K/uL   Eosinophils Relative 0 %   Eosinophils Absolute 0.0 0.0 - 0.5 K/uL   Basophils Relative 0 %   Basophils Absolute 0.0 0.0 - 0.1 K/uL   Immature Granulocytes 0 %   Abs Immature Granulocytes 0.03 0.00 - 0.07 K/uL  Comprehensive metabolic panel  Result Value Ref Range   Sodium 139 135 - 145 mmol/L   Potassium 4.6 3.5 - 5.1 mmol/L   Chloride 99 98 - 111 mmol/L   CO2 26 22 - 32 mmol/L   Glucose, Bld 122 (H) 70 - 99 mg/dL   BUN 22 8 - 23 mg/dL   Creatinine, Ser 0.96 0.61 - 1.24 mg/dL   Calcium 9.2 8.9 - 10.3 mg/dL   Total  Protein 6.9 6.5 - 8.1 g/dL   Albumin 4.3 3.5 - 5.0 g/dL   AST 24 15 - 41 U/L   ALT 31 0 - 44 U/L   Alkaline Phosphatase 69 38 - 126 U/L   Total Bilirubin 0.9 0.3 - 1.2 mg/dL   GFR, Estimated >60 >60 mL/min   Anion gap 14 5 - 15  Lipase, blood  Result Value Ref Range   Lipase 18 11 - 51 U/L   DG Abdomen Acute W/Chest  Result Date: 11/05/2020 CLINICAL DATA:  Abdominal pain, tenderness EXAM: DG ABDOMEN ACUTE WITH 1 VIEW CHEST COMPARISON:  02/11/2020 FINDINGS: Supine and upright frontal views of the abdomen as well as an upright frontal view of the chest are obtained. The cardiac silhouette is unremarkable. Upper lobe predominant bullous emphysema with bibasilar scarring again noted. No airspace  disease, effusion, or pneumothorax. Bowel gas pattern is unremarkable without obstruction or ileus. No free gas in the greater peritoneal sac. No abdominal masses or abnormal calcifications. No acute bony abnormalities. IMPRESSION: 1. Unremarkable bowel gas pattern. 2. Emphysema.  No acute airspace disease. Electronically Signed   By: Randa Ngo M.D.   On: 10/24/2020 22:13   CT Angio Abd/Pel W and/or Wo Contrast  Result Date: 10/22/2020 CLINICAL DATA:  Concern for mesenteric ischemia.  Abdominal pain. EXAM: CTA ABDOMEN AND PELVIS WITHOUT AND WITH CONTRAST TECHNIQUE: Multidetector CT imaging of the abdomen and pelvis was performed using the standard protocol during bolus administration of intravenous contrast. Multiplanar reconstructed images and MIPs were obtained and reviewed to evaluate the vascular anatomy. CONTRAST:  137mL OMNIPAQUE IOHEXOL 350 MG/ML SOLN COMPARISON:  CT dated 02/17/2020 FINDINGS: VASCULAR Aorta: There are atherosclerotic changes of the abdominal aorta without evidence for an aneurysm or dissection. Celiac: There is mild narrowing at the origin of the celiac axis. SMA: Patent without evidence of aneurysm, dissection, vasculitis or significant stenosis. Renals: Both renal arteries are patent without evidence of aneurysm, dissection, vasculitis, fibromuscular dysplasia or significant stenosis. There are 2 right renal arteries. IMA: Patent without evidence of aneurysm, dissection, vasculitis or significant stenosis. Inflow: Patent without evidence of aneurysm, dissection, vasculitis or significant stenosis. Proximal Outflow: Bilateral common femoral and visualized portions of the superficial and profunda femoral arteries are patent without evidence of aneurysm, dissection, vasculitis or significant stenosis. Veins: No obvious venous abnormality within the limitations of this arterial phase study. Review of the MIP images confirms the above findings. NON-VASCULAR Lower chest: Severe  emphysematous changes are noted. Mucous plugging is again noted involving the right lower lobe. Hepatobiliary: There is a small hyperattenuating focus in the right hepatic lobe measuring approximately 8 mm (axial series 7, image 39). This is favored to represent a flash hemangioma. Normal gallbladder.There is no biliary ductal dilation. Pancreas: Normal contours without ductal dilatation. No peripancreatic fluid collection. Spleen: There are multiple calcifications throughout the patient's spleen. Adrenals/Urinary Tract: --Adrenal glands: Unremarkable. --Right kidney/ureter: No hydronephrosis or radiopaque kidney stones. --Left kidney/ureter: No hydronephrosis or radiopaque kidney stones. --Urinary bladder: Unremarkable. Stomach/Bowel: --Stomach/Duodenum: No hiatal hernia or other gastric abnormality. Normal duodenal course and caliber. --Small bowel: Unremarkable. --Colon: There are scattered colonic diverticula. There is mild fat stranding about several diverticula involving the distal descending colon (axial series 7, image 137). --Appendix: Normal. Lymphatic: --again noted is a relatively stable enhancing lymph node adjacent to the left renal artery. This is not significantly changed from prior study. --No mesenteric lymphadenopathy. --No pelvic or inguinal lymphadenopathy. Reproductive: The prostate gland is enlarged. Other: No ascites or  free air. The abdominal wall is normal. Musculoskeletal. 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 IMPRESSION: 1. No CT evidence for mesenteric ischemia. 2. There is mild fat stranding about several diverticula involving the distal descending colon, suggestive of mild acute uncomplicated diverticulitis in the appropriate clinical setting. 3. Persistent mucous plugging involving the right lower lobe. 4. Prostatomegaly. Aortic Atherosclerosis (ICD10-I70.0). Electronically Signed   By: Constance Holster M.D.   On: 10/22/2020  01:14   Images viewed by me.    Delora Fuel, MD 53/66/44 [phone]

## 2020-10-22 NOTE — Progress Notes (Signed)
RT placed the pt on 10L salter at 0915 and his SATS were 90-93% and he became very anxious and said he wasn't getting any oxygen. RT worked with the Pt til 0950 to help him get use to the flow of the salter. RT placed pt back on bipap 12/7 and 100%. RT will try again after 1200

## 2020-10-22 NOTE — ED Notes (Signed)
Spoke with patients daughter [name], she is on her way here to be with her father.

## 2020-10-22 NOTE — ED Notes (Signed)
Pt noted in CT to be coughing up blood. HR is elevated again, SPO2 is in the 80s on NRB, MD is aware and placing orders to address.

## 2020-10-23 ENCOUNTER — Encounter (HOSPITAL_COMMUNITY): Payer: Self-pay | Admitting: Internal Medicine

## 2020-10-23 ENCOUNTER — Other Ambulatory Visit: Payer: Self-pay

## 2020-10-23 LAB — CBC WITH DIFFERENTIAL/PLATELET
Abs Immature Granulocytes: 0.07 10*3/uL (ref 0.00–0.07)
Basophils Absolute: 0 10*3/uL (ref 0.0–0.1)
Basophils Relative: 0 %
Eosinophils Absolute: 0 10*3/uL (ref 0.0–0.5)
Eosinophils Relative: 0 %
HCT: 38.2 % — ABNORMAL LOW (ref 39.0–52.0)
Hemoglobin: 12 g/dL — ABNORMAL LOW (ref 13.0–17.0)
Immature Granulocytes: 1 %
Lymphocytes Relative: 4 %
Lymphs Abs: 0.6 10*3/uL — ABNORMAL LOW (ref 0.7–4.0)
MCH: 32.3 pg (ref 26.0–34.0)
MCHC: 31.4 g/dL (ref 30.0–36.0)
MCV: 103 fL — ABNORMAL HIGH (ref 80.0–100.0)
Monocytes Absolute: 0.7 10*3/uL (ref 0.1–1.0)
Monocytes Relative: 4 %
Neutro Abs: 13.7 10*3/uL — ABNORMAL HIGH (ref 1.7–7.7)
Neutrophils Relative %: 91 %
Platelets: 190 10*3/uL (ref 150–400)
RBC: 3.71 MIL/uL — ABNORMAL LOW (ref 4.22–5.81)
RDW: 13 % (ref 11.5–15.5)
WBC: 15 10*3/uL — ABNORMAL HIGH (ref 4.0–10.5)
nRBC: 0 % (ref 0.0–0.2)

## 2020-10-23 LAB — BASIC METABOLIC PANEL
Anion gap: 15 (ref 5–15)
BUN: 57 mg/dL — ABNORMAL HIGH (ref 8–23)
CO2: 26 mmol/L (ref 22–32)
Calcium: 9 mg/dL (ref 8.9–10.3)
Chloride: 99 mmol/L (ref 98–111)
Creatinine, Ser: 1.49 mg/dL — ABNORMAL HIGH (ref 0.61–1.24)
GFR, Estimated: 47 mL/min — ABNORMAL LOW (ref >60)
Glucose, Bld: 154 mg/dL — ABNORMAL HIGH (ref 70–99)
Potassium: 4.9 mmol/L (ref 3.5–5.1)
Sodium: 140 mmol/L (ref 135–145)

## 2020-10-23 MED ORDER — MORPHINE SULFATE (PF) 2 MG/ML IV SOLN
1.0000 mg | INTRAVENOUS | Status: DC | PRN
  Administered 2020-10-23 – 2020-10-24 (×4): 1 mg via INTRAVENOUS
  Filled 2020-10-23 (×4): qty 1

## 2020-10-23 MED ORDER — LORAZEPAM 2 MG/ML IJ SOLN
1.0000 mg | INTRAMUSCULAR | Status: DC | PRN
  Administered 2020-10-23 – 2020-10-28 (×12): 1 mg via INTRAVENOUS
  Filled 2020-10-23 (×12): qty 1

## 2020-10-23 MED ORDER — DILTIAZEM HCL-DEXTROSE 125-5 MG/125ML-% IV SOLN (PREMIX): 5.0000 mg/h | INTRAVENOUS | Status: DC

## 2020-10-23 MED ORDER — METOPROLOL TARTRATE 5 MG/5ML IV SOLN
5.0000 mg | INTRAVENOUS | Status: AC | PRN
  Administered 2020-10-23 (×2): 5 mg via INTRAVENOUS
  Filled 2020-10-23 (×2): qty 5

## 2020-10-23 MED ORDER — METOPROLOL TARTRATE 5 MG/5ML IV SOLN
5.0000 mg | Freq: Four times a day (QID) | INTRAVENOUS | Status: DC | PRN
  Administered 2020-10-23 – 2020-10-24 (×2): 5 mg via INTRAVENOUS
  Filled 2020-10-23 (×4): qty 5

## 2020-10-23 NOTE — Progress Notes (Signed)
Pt became anxious, tachycardic and hypertensive, noted on monitor went into A-fib RVR, ECG ordered and obtained. Gave morphine and ativan for anxiety and MD notified. Lopressor PRN ordered and given. Pt in controlled A-fib now and less anxious. Will continue to monitor closely

## 2020-10-23 NOTE — Progress Notes (Signed)
Nutrition Brief Note  Patient identified on the Malnutrition Screening Tool (MST) Report  Wt Readings from Last 15 Encounters:  10/22/20 55.7 kg  08/21/20 61.2 kg  07/25/20 59 kg  07/21/20 59 kg  07/18/20 59 kg  07/07/20 59 kg  06/18/20 61.1 kg  06/03/20 61.6 kg  04/27/20 63.5 kg  04/15/20 61.2 kg  04/05/20 65.8 kg  12/22/19 65.5 kg  11/18/19 64.4 kg  09/22/19 65.3 kg  07/20/19 63.8 kg    Body mass index is 19.23 kg/m. Patient meets criteria for normal weight based on current BMI. Skin WDL.   Patient discussed in rounds and also discussed separately with RN following rounds. Patient is likely to d/c to residential hospice, although there is concern that he is not stable enough/will not survive transport.   Current diet order is FLD.   Labs and medications reviewed.   No nutrition interventions warranted at this time. If nutrition issues arise, please consult RD.       Jarome Matin, MS, RD, LDN, CNSC Inpatient Clinical Dietitian RD pager # [phone] in [place]  After hours/weekend pager # [phone] in [facility]

## 2020-10-23 NOTE — Progress Notes (Signed)
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 fib who presents to the emergency department with complaints of abdominal pain. As per the report, he has chronic abdominal pain but the pain recently exacerbated. No history of nausea, vomiting or diarrhea. On presentation he was found to have A. fib with RVR. While on CT scan for the evaluation of abdominal pain, he had frank bright red blood hemoptysis resulting in severe worsening of respiratory status and had to be kept on nonrebreather with later transitioned to BiPAP.  CT abdomen/pelvis showed mild acute diverticulitis on the descending colon. Patient was started on antibiotics. Hospital course remarkable for persistent need of BiPAP.  After extensive discussion with the patient and the daughter, the plan is to discharge him back to home with hospice for end-of-life.  They deny residential hospice.  Assessment & Plan:   Principal Problem:   Acute on chronic respiratory failure with hypoxia (HCC) Active Problems:   End stage COPD ([place])   Current chronic use of systemic steroids   Hospice care patient   Major hemoptysis   Acute diverticulitis   Atrial fibrillation with RVR (HCC)    Acute on chronic respiratory failure with hypoxia: History of end-stage COPD currently enrolled in hospice at home.  Currently BiPAP dependent.  Unable to wean BiPAP off today. We will can continue to try to wean the BiPAP and put him on nasal cannula before discharge to home tomorrow Continue steroids, bronchodilators Continue morphine for severe dyspnea  End-stage COPD: On home oxygen and bronchodilators.  Continue current management  A. fib with RVR: History of paroxysmal A. fib but not on anticoagulation at home.   Continue IV Cardizem  Chronic abdominal pain/diverticulitis: Patient has history of chronic abdominal pain.  Currently his abdomen is benign on examination.  CT abdomen/pelvis done in the emergency department showed mild diverticulitis.  Continue full liquid diet.  Continue Zosyn.  Hemoptysis: One episode that happened while on CT scan.  His hemoglobin is stable and he does not have any further episodes of hemoptysis.  Continue to monitor.  Goals of care: Patient is currently enrolled with hospice at home.  Has end-stage COPD.  Respiratory status has not improved.  After discussion with the daughter today, she wants to take her father to home.  Did not want to go to residential hospice We will continue current management today expecting some improvement for discharge tomorrow to home with hospice         DVT prophylaxis:SCD Code Status: DnR Family Communication: Discussed with daughter on phone today Status is: Inpatient  Remains inpatient appropriate because:Inpatient level of care appropriate due to severity of illness   Dispo: The patient is from: Home              Anticipated d/c is to: Home              Anticipated d/c date is: 1 day              Patient currently is not medically stable to d/c.    Consultants: None  Procedures:None  Antimicrobials:  Anti-infectives (From admission, onward)   Start     Dose/Rate Route Frequency Ordered Stop   10/22/20 0800  piperacillin-tazobactam (ZOSYN) IVPB 3.375 g        3.375 g 12.5  mL/hr over 240 Minutes Intravenous Every 8 hours 10/22/20 0302     10/22/20 0200  piperacillin-tazobactam (ZOSYN) IVPB 3.375 g        3.375 g 100 mL/hr over 30 Minutes Intravenous  Once 10/22/20 0152 10/22/20 0255      Subjective:  Patient seen and examined the bedside this morning.  He was on BiPAP.  Attempt was made to wean off the BiPAP but he could not tolerate.  He was tachypneic.  No significant change from yesterday.  Objective: Vitals:     10/23/20 0900 10/23/20 0927 10/23/20 1000 10/23/20 1113  BP: (!) 124/100  (!) 109/45   Pulse: (!) 105 78 86 80  Resp: 13 13 17 16   Temp:      TempSrc:      SpO2: 96% 96% 98% 100%  Weight:      Height:        Intake/Output Summary (Last 24 hours) at 10/23/2020 1127 Last data filed at 10/23/2020 0845 Gross per 24 hour  Intake 347.32 ml  Output --  Net 347.32 ml   Filed Weights   10/22/20 2200  Weight: 55.7 kg    Examination:  General exam: Very deconditioned, debilitated elderly male in respiratory distress Respiratory system: Bilateral diminished air sounds  cardiovascular system: Irregularly irregular rhythm. No JVD, murmurs, rubs, gallops or clicks. No pedal edema. Gastrointestinal system: Abdomen is nondistended, soft and nontender. No organomegaly or masses felt. Normal bowel sounds heard. Central nervous system: Alert and oriented. No focal neurological deficits. Extremities: No edema, no clubbing ,no cyanosis Skin: No rashes, lesions or ulcers,no icterus ,no pallor   Data Reviewed: I have personally reviewed following labs and imaging studies  CBC: Recent Labs  Lab 11/16/2020 2213 10/22/20 1043 10/23/20 0829  WBC 12.6* 25.6* 15.0*  NEUTROABS 11.2* 24.3* 13.7*  HGB 13.5 13.1 12.0*  HCT 41.7 41.2 38.2*  MCV 100.2* 102.2* 103.0*  PLT 186 198 323   Basic Metabolic Panel: Recent Labs  Lab 11/11/2020 2213 10/23/20 0829  NA 139 140  K 4.6 4.9  CL 99 99  CO2 26 26  GLUCOSE 122* 154*  BUN 22 57*  CREATININE 0.96 1.49*  CALCIUM 9.2 9.0   GFR: Estimated Creatinine Clearance: 30.6 mL/min (A) (by C-G formula based on SCr of 1.49 mg/dL (H)). Liver Function Tests: Recent Labs  Lab 10/23/2020 2213  AST 24  ALT 31  ALKPHOS 69  BILITOT 0.9  PROT 6.9  ALBUMIN 4.3   Recent Labs  Lab 11/01/2020 2213  LIPASE 18   No results for input(s): AMMONIA in the last 168 hours. Coagulation Profile: No results for input(s): INR, PROTIME in the last 168 hours. Cardiac  Enzymes: No results for input(s): CKTOTAL, CKMB, CKMBINDEX, TROPONINI in the last 168 hours. BNP (last 3 results) No results for input(s): PROBNP in the last 8760 hours. HbA1C: No results for input(s): HGBA1C in the last 72 hours. CBG: No results for input(s): GLUCAP in the last 168 hours. Lipid Profile: No results for input(s): CHOL, HDL, LDLCALC, TRIG, CHOLHDL, LDLDIRECT in the last 72 hours. Thyroid Function Tests: No results for input(s): TSH, T4TOTAL, FREET4, T3FREE, THYROIDAB in the last 72 hours. Anemia Panel: No results for input(s): VITAMINB12, FOLATE, FERRITIN, TIBC, IRON, RETICCTPCT in the last 72 hours. Sepsis Labs: No results for input(s): PROCALCITON, LATICACIDVEN in the last 168 hours.  Recent Results (from the past 240 hour(s))  Resp Panel by RT-PCR (Flu A&B, Covid) Nasopharyngeal Swab     Status: None  Collection Time: 10/22/20 12:03 AM   Specimen: Nasopharyngeal Swab; Nasopharyngeal(NP) swabs in vial transport medium  Result Value Ref Range Status   SARS Coronavirus 2 by RT PCR NEGATIVE NEGATIVE Final    Comment: (NOTE) SARS-CoV-2 target nucleic acids are NOT DETECTED.  The SARS-CoV-2 RNA is generally detectable in upper respiratory specimens during the acute phase of infection. The lowest concentration of SARS-CoV-2 viral copies this assay can detect is 138 copies/mL. A negative result does not preclude SARS-Cov-2 infection and should not be used as the sole basis for treatment or other patient management decisions. A negative result may occur with  improper specimen collection/handling, submission of specimen other than nasopharyngeal swab, presence of viral mutation(s) within the areas targeted by this assay, and inadequate number of viral copies(<138 copies/mL). A negative result must be combined with clinical observations, patient history, and epidemiological information. The expected result is Negative.  Fact Sheet for Patients:    EntrepreneurPulse.com.au  Fact Sheet for Healthcare Providers:  IncredibleEmployment.be  This test is no t yet approved or cleared by the [place] FDA and  has been authorized for detection and/or diagnosis of SARS-CoV-2 by FDA under an Emergency Use Authorization (EUA). This EUA will remain  in effect (meaning this test can be used) for the duration of the COVID-19 declaration under Section 564(b)(1) of the Act, 21 U.S.C.section 360bbb-3(b)(1), unless the authorization is terminated  or revoked sooner.       Influenza A by PCR NEGATIVE NEGATIVE Final   Influenza B by PCR NEGATIVE NEGATIVE Final    Comment: (NOTE) The Xpert Xpress SARS-CoV-2/FLU/RSV plus assay is intended as an aid in the diagnosis of influenza from Nasopharyngeal swab specimens and should not be used as a sole basis for treatment. Nasal washings and aspirates are unacceptable for Xpert Xpress SARS-CoV-2/FLU/RSV testing.  Fact Sheet for Patients: EntrepreneurPulse.com.au  Fact Sheet for Healthcare Providers: IncredibleEmployment.be  This test is not yet approved or cleared by the [place] FDA and has been authorized for detection and/or diagnosis of SARS-CoV-2 by FDA under an Emergency Use Authorization (EUA). This EUA will remain in effect (meaning this test can be used) for the duration of the COVID-19 declaration under Section 564(b)(1) of the Act, 21 U.S.C. section 360bbb-3(b)(1), unless the authorization is terminated or revoked.  Performed at [facility], [place] [address], [place], [place] [address]   MRSA PCR Screening     Status: None   Collection Time: 10/22/20  9:31 PM   Specimen: Nasal Mucosa; Nasopharyngeal  Result Value Ref Range Status   MRSA by PCR NEGATIVE NEGATIVE Final    Comment:        The GeneXpert MRSA Assay (FDA approved for NASAL specimens only), is one component of  a comprehensive MRSA colonization surveillance program. It is not intended to diagnose MRSA infection nor to guide or monitor treatment for MRSA infections. Performed at [facility], [place] [address], [place], [place] [address]          Radiology Studies: DG Chest 1 View  Result Date: 10/22/2020 CLINICAL DATA:  Coughing up blood EXAM: CHEST  1 VIEW COMPARISON:  07/10/2020 FINDINGS: Severe emphysematous changes are noted. Atelectasis and scarring is noted at the lung bases. There is no pneumothorax. The heart size is unremarkable. There is no acute displaced fracture. There are old healed right-sided rib fractures. IMPRESSION: Severe emphysematous changes. No acute cardiopulmonary process. Electronically Signed   By: Constance Holster M.D.   On: 10/22/2020 02:11  DG Abdomen Acute W/Chest  Result Date: 11/15/2020 CLINICAL DATA:  Abdominal pain, tenderness EXAM: DG ABDOMEN ACUTE WITH 1 VIEW CHEST COMPARISON:  02/11/2020 FINDINGS: Supine and upright frontal views of the abdomen as well as an upright frontal view of the chest are obtained. The cardiac silhouette is unremarkable. Upper lobe predominant bullous emphysema with bibasilar scarring again noted. No airspace disease, effusion, or pneumothorax. Bowel gas pattern is unremarkable without obstruction or ileus. No free gas in the greater peritoneal sac. No abdominal masses or abnormal calcifications. No acute bony abnormalities. IMPRESSION: 1. Unremarkable bowel gas pattern. 2. Emphysema.  No acute airspace disease. Electronically Signed   By: Randa Ngo M.D.   On: 10/22/2020 22:13   CT Angio Abd/Pel W and/or Wo Contrast  Result Date: 10/22/2020 CLINICAL DATA:  Concern for mesenteric ischemia.  Abdominal pain. EXAM: CTA ABDOMEN AND PELVIS WITHOUT AND WITH CONTRAST TECHNIQUE: Multidetector CT imaging of the abdomen and pelvis was performed using the standard protocol during bolus administration of intravenous contrast.  Multiplanar reconstructed images and MIPs were obtained and reviewed to evaluate the vascular anatomy. CONTRAST:  116mL OMNIPAQUE IOHEXOL 350 MG/ML SOLN COMPARISON:  CT dated 02/17/2020 FINDINGS: VASCULAR Aorta: There are atherosclerotic changes of the abdominal aorta without evidence for an aneurysm or dissection. Celiac: There is mild narrowing at the origin of the celiac axis. SMA: Patent without evidence of aneurysm, dissection, vasculitis or significant stenosis. Renals: Both renal arteries are patent without evidence of aneurysm, dissection, vasculitis, fibromuscular dysplasia or significant stenosis. There are 2 right renal arteries. IMA: Patent without evidence of aneurysm, dissection, vasculitis or significant stenosis. Inflow: Patent without evidence of aneurysm, dissection, vasculitis or significant stenosis. Proximal Outflow: Bilateral common femoral and visualized portions of the superficial and profunda femoral arteries are patent without evidence of aneurysm, dissection, vasculitis or significant stenosis. Veins: No obvious venous abnormality within the limitations of this arterial phase study. Review of the MIP images confirms the above findings. NON-VASCULAR Lower chest: Severe emphysematous changes are noted. Mucous plugging is again noted involving the right lower lobe. Hepatobiliary: There is a small hyperattenuating focus in the right hepatic lobe measuring approximately 8 mm (axial series 7, image 39). This is favored to represent a flash hemangioma. Normal gallbladder.There is no biliary ductal dilation. Pancreas: Normal contours without ductal dilatation. No peripancreatic fluid collection. Spleen: There are multiple calcifications throughout the patient's spleen. Adrenals/Urinary Tract: --Adrenal glands: Unremarkable. --Right kidney/ureter: No hydronephrosis or radiopaque kidney stones. --Left kidney/ureter: No hydronephrosis or radiopaque kidney stones. --Urinary bladder: Unremarkable.  Stomach/Bowel: --Stomach/Duodenum: No hiatal hernia or other gastric abnormality. Normal duodenal course and caliber. --Small bowel: Unremarkable. --Colon: There are scattered colonic diverticula. There is mild fat stranding about several diverticula involving the distal descending colon (axial series 7, image 137). --Appendix: Normal. Lymphatic: --again noted is a relatively stable enhancing lymph node adjacent to the left renal artery. This is not significantly changed from prior study. --No mesenteric lymphadenopathy. --No pelvic or inguinal lymphadenopathy. Reproductive: The prostate gland is enlarged. Other: No ascites or free air. The abdominal wall is normal. Musculoskeletal. 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 IMPRESSION: 1. No CT evidence for mesenteric ischemia. 2. There is mild fat stranding about several diverticula involving the distal descending colon, suggestive of mild acute uncomplicated diverticulitis in the appropriate clinical setting. 3. Persistent mucous plugging involving the right lower lobe. 4. Prostatomegaly. Aortic Atherosclerosis (ICD10-I70.0). Electronically Signed   By: Jamie Kato.D.  On: 10/22/2020 01:14        Scheduled Meds: . Chlorhexidine Gluconate Cloth  6 each Topical Q0600  . mouth rinse  15 mL Mouth Rinse BID  . melatonin  10 mg Oral QHS  . methylPREDNISolone (SOLU-MEDROL) injection  40 mg Intravenous Q12H  . sildenafil  20 mg Oral BID  . temazepam  30 mg Oral QHS   Continuous Infusions: . diltiazem (CARDIZEM) infusion Stopped (10/23/20 0933)  . piperacillin-tazobactam (ZOSYN)  IV Stopped (10/23/20 0845)     LOS: 1 day    Time spent: 35 mins.More than 50% of that time was spent in counseling and/or coordination of care.      Shelly Coss, MD [facility] P12/04/2020, 11:27 AM

## 2020-10-23 NOTE — TOC Progression Note (Signed)
Transition of Care [facility]) - Progression Note    Patient Details  Name: Andrew [name] MRN: [id number] Date of Birth: 11/18/1941  Transition of Care [facility]) CM/SW Contact  Leeroy Cha, RN Phone Number: 10/23/2020, 12:43 PM  Clinical Narrative:    [facility] aNNE/INFORMED THE PATIENT IS TO BE DCD BACK HOME ON [id number]/STATED THAT cHRISLYN kING WILL SEE PATIENT TODAY.        Expected Discharge Plan and Services                                                 Social Determinants of Health (SDOH) Interventions    Readmission Risk Interventions No flowsheet data found.

## 2020-10-23 NOTE — Telephone Encounter (Signed)
10/23/20   Attempted to reach the patient at the preferred contact information.  This to be the first attempt.  If 3 attempts are made this triage message will be closed per office protocol.  Voicemail was left.  Wyn Quaker, FNP

## 2020-10-23 NOTE — Progress Notes (Signed)
[place] [address] [facility]) Hospitalized Hospice Patient   Andrew [name] is a hospice patient with a terminal diagnosis of COPD. He was complaining of increasing abdominal pain and increasing SOB. The family decided to send him to the ED for further evaluation. Hospice was notified once he arrived at the ED. He is admitted with acute on chronic respiratory failure. This is a related hospital admission.  Visited with pt and family at bedside.  Pt on BiPap, some fidgeting noted, endorses anxiety but denies pain.  Daughter Cecille Rubin at bedside along with other family members.  Lori tearful, verbalizes understanding that her father is at end of life (EOL), shares that she wants to take him home and that she is not ready to let him go yet.  Discussed with family at bedside possibility that pt would not be stable to transport home; discussed switching to full comfort care anticipating a hospital death.  Family would like the chance to discuss this more.  Teaching provided for addressing EOL symptoms in home and signs/symptoms of anxiety/agitation/pain.  Family verbalized understanding.  Report exchanged with [facility] RN as well as with [facility] RN/CM.  Per family request chaplain requested to bedside. V/S: 98.3 axillary, 115/55, 74HR, 20 RR, 100% bipap Labs: BUN 57, Creat 1.49, GFR 47, WBC 15.0, Hgb 12, NEUT# 13.7 Diagnostics: no new scans/film today  Problem List: - acute on chronic respiratory failure - end stage COPD, stabilized on BiPAP  - end stage COPD - under hospice care  GOC: goal to d/c home for home death.  Discussion with family at bedside re full comfort care. Family o discuss more this evening. D/C planning: ongoing, family would like to return home, if possible Family: present and updated  IDT: hospice team updated   Domenic Moras, BSN, RN [facility] Liaison [phone] (24h on call)

## 2020-10-23 NOTE — Progress Notes (Signed)
Patient's health is not good.  Family has been coming in to see him and spend time with him as his time may be limited.  Chaplain was paged to come and be with them and have prayer with them. Offered pastoral presence and prayer. Andrew [name], Chaplain   10/23/20 1600  Clinical Encounter Type  Visited With Patient and family together  Visit Type Other (Comment) (family coming in )  Referral From Nurse  Consult/Referral To Chaplain  Spiritual Encounters  Spiritual Needs Prayer;Emotional  Stress Factors  Patient Stress Factors Health changes;Other (Comment) (may be end of life)  Family Stress Factors Other (Comment) (family coming to see him)

## 2020-10-24 MED ORDER — MORPHINE 100MG IN NS 100ML (1MG/ML) PREMIX INFUSION
5.0000 mg/h | INTRAVENOUS | Status: DC
  Administered 2020-10-24: 7 mg/h via INTRAVENOUS
  Administered 2020-10-24: 5 mg/h via INTRAVENOUS
  Administered 2020-10-25 – 2020-10-27 (×5): 7 mg/h via INTRAVENOUS
  Filled 2020-10-24 (×8): qty 100

## 2020-10-24 MED ORDER — GLYCOPYRROLATE 0.2 MG/ML IJ SOLN
0.1000 mg | Freq: Two times a day (BID) | INTRAMUSCULAR | Status: DC
  Administered 2020-10-24 – 2020-10-27 (×8): 0.1 mg via INTRAVENOUS
  Filled 2020-10-24 (×8): qty 1

## 2020-10-24 NOTE — Progress Notes (Signed)
Rec'd patient from ICU, family at bedside, pt morphine drip continoues at 7mg  per hour. Oriented family and patient to unit and surroundings. All questions answered.

## 2020-10-24 NOTE — Progress Notes (Signed)
[place] [address] [facility] [facility]) Hospitalized Hospice Patient   [name] is a hospice patient with a terminal diagnosis of COPD. He was complaining of increasing abdominal pain and increasing SOB. The family decided to send him to the ED for further evaluation. Hospice was notified once he arrived at the ED. He is admitted with acute on chronic respiratory failure. This is a related hospital admission.  Visited with pt and family at bedside.  Family has accepted that pt is not stable to transport home and anticipate a hospital death.  Morphine  drip started, and Bipap discontinued; pt now full comfort care.  Pt alert, confused, denies pain.  Respirations labored with accessory muscle use.  Family denies new questions at this time.  Report exchanged with [facility] RN as well as with [facility] RN/CM and [facility] SW.  Per family request chaplain requested to bedside. V/S: 97.7 axillary, 162/101, 96HR, 22RR, 100% 8L HFNC Labs: no new labs Diagnostics: no new scans/film  IV/PRN Meds:  now on continuous morphine infusion @7mg /hr; Ativan 1mg  PRN x 3 doses, robinul 0.1 mg PIV BID Problem List: - acute on chronic respiratory failure - end stage COPD, stabilized on BiPAP  - end stage COPD - under hospice care, now full comfort care  GOC: comfort at EOL in hospital setting D/C planning: hospital death anticipated Family: present and updated  IDT: hospice team updated   Domenic Moras, BSN, [place] [facility] Liaison [phone]

## 2020-10-24 NOTE — Progress Notes (Signed)
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 fib who presents to the emergency department with complaints of abdominal pain. As per the report, he has chronic abdominal pain but the pain recently exacerbated. No history of nausea, vomiting or diarrhea. On presentation he was found to have A. fib with RVR. While on CT scan for the evaluation of abdominal pain, he had frank bright red blood hemoptysis resulting in severe worsening of respiratory status and had to be kept on nonrebreather with later transitioned to BiPAP.  CT abdomen/pelvis showed mild acute diverticulitis on the descending colon. Patient was also started on antibiotics. Hospital course remarkable for persistent need of BiPAP and worsening respiratory status.  After extensive discussion with the family, his care was transitioned to full comfort.  Started on morphine drip.  They deny residential hospice.  Anticipate hospital death.  Assessment & Plan:   Principal Problem:   Acute on chronic respiratory failure with hypoxia (HCC) Active Problems:   End stage COPD ([place])   Current chronic use of systemic steroids   Hospice care patient   Major hemoptysis   Acute diverticulitis   Atrial fibrillation with RVR (HCC)    Acute on chronic respiratory failure with hypoxia: History of end-stage COPD currently enrolled in hospice at home.   Unable to wean BiPAP after admission. Now on comfort care  End-stage COPD: On home oxygen and bronchodilators.   A. fib with RVR: History of paroxysmal A. fib but not on anticoagulation at home.  Was in A. fib with RVR on presentation and was given Cardizem drip with improvement.  Now on comfort care  Chronic abdominal pain/diverticulitis: Patient has history of  chronic abdominal pain.   abdomen was benign on examination.  CT abdomen/pelvis done in the emergency department showed mild diverticulitis.  Started on Zosyn which has been discontinued now  Hemoptysis: One episode that happened while on CT scan.  His hemoglobin remained  stable and he does not have any further episodes of hemoptysis.    Goals of care: Patient is currently enrolled with hospice at home.  Has end-stage COPD.  Respiratory status continued to decline. After extensive discussion with the family, his care was transitioned to full comfort.  Started on morphine drip.  They deny residential hospice.  Anticipate hospital death.        DVT prophylaxis:SCD Code Status: DnR Family Communication: Discussed with family at bedside  status is: Inpatient  Remains inpatient appropriate because:Inpatient level of care appropriate due to severity of illness   Dispo: The patient is from: Home              Anticipated d/c is to: Hospital death              Anticipated d/c date is: 1 day     Consultants: None  Procedures:None  Antimicrobials:  Anti-infectives (From admission, onward)   Start     Dose/Rate Route Frequency Ordered Stop   10/22/20 0800  piperacillin-tazobactam (ZOSYN) IVPB 3.375 g  Status:  Discontinued        3.375 g 12.5 mL/hr over 240 Minutes Intravenous Every 8 hours 10/22/20 0302 10/24/20 1019   10/22/20 0200  piperacillin-tazobactam (ZOSYN) IVPB 3.375 g        3.375 g 100 mL/hr over 30 Minutes Intravenous  Once  10/22/20 0152 10/22/20 0255      Subjective:  Patient seen and examined at the bedside this morning.  He was struggling with breath.  On BiPAP.  Unresponsive.  After extensive discussion with family, comfort care is started.  He will be moved to MedSurg  Objective: Vitals:   10/24/20 0803 10/24/20 0955 10/24/20 1000 10/24/20 1100  BP: (!) 175/107  (!) 162/101   Pulse: (!) 118 (!) 101 96 75  Resp: (!) 24 (!) 26 (!) 22 (!) 21  Temp:       TempSrc:      SpO2: 100% 100% 100% 100%  Weight:      Height:        Intake/Output Summary (Last 24 hours) at 10/24/2020 1147 Last data filed at 10/24/2020 0810 Gross per 24 hour  Intake 148.22 ml  Output 600 ml  Net -451.78 ml   Filed Weights   10/22/20 2200  Weight: 55.7 kg    Examination:  General exam: In severe respiratory distress, on BiPAP Respiratory system: Bilateral diminished air sounds  cardiovascular system: Irregularly irregular rhythm  gastrointestinal system: Abdomen is nondistended, soft and nontender.  Central nervous system: Not alert and oriented Extremities: No edema, no clubbing ,no cyanosis,  Data Reviewed: I have personally reviewed following labs and imaging studies  CBC: Recent Labs  Lab 10/27/2020 2213 10/22/20 1043 10/23/20 0829  WBC 12.6* 25.6* 15.0*  NEUTROABS 11.2* 24.3* 13.7*  HGB 13.5 13.1 12.0*  HCT 41.7 41.2 38.2*  MCV 100.2* 102.2* 103.0*  PLT 186 198 505   Basic Metabolic Panel: Recent Labs  Lab 10/18/2020 2213 10/23/20 0829  NA 139 140  K 4.6 4.9  CL 99 99  CO2 26 26  GLUCOSE 122* 154*  BUN 22 57*  CREATININE 0.96 1.49*  CALCIUM 9.2 9.0   GFR: Estimated Creatinine Clearance: 30.6 mL/min (A) (by C-G formula based on SCr of 1.49 mg/dL (H)). Liver Function Tests: Recent Labs  Lab 10/27/2020 2213  AST 24  ALT 31  ALKPHOS 69  BILITOT 0.9  PROT 6.9  ALBUMIN 4.3   Recent Labs  Lab 11/17/2020 2213  LIPASE 18   No results for input(s): AMMONIA in the last 168 hours. Coagulation Profile: No results for input(s): INR, PROTIME in the last 168 hours. Cardiac Enzymes: No results for input(s): CKTOTAL, CKMB, CKMBINDEX, TROPONINI in the last 168 hours. BNP (last 3 results) No results for input(s): PROBNP in the last 8760 hours. HbA1C: No results for input(s): HGBA1C in the last 72 hours. CBG: No results for input(s): GLUCAP in the last 168 hours. Lipid Profile: No results for input(s): CHOL, HDL, LDLCALC, TRIG,  CHOLHDL, LDLDIRECT in the last 72 hours. Thyroid Function Tests: No results for input(s): TSH, T4TOTAL, FREET4, T3FREE, THYROIDAB in the last 72 hours. Anemia Panel: No results for input(s): VITAMINB12, FOLATE, FERRITIN, TIBC, IRON, RETICCTPCT in the last 72 hours. Sepsis Labs: No results for input(s): PROCALCITON, LATICACIDVEN in the last 168 hours.  Recent Results (from the past 240 hour(s))  Resp Panel by RT-PCR (Flu A&B, Covid) Nasopharyngeal Swab     Status: None   Collection Time: 10/22/20 12:03 AM   Specimen: Nasopharyngeal Swab; Nasopharyngeal(NP) swabs in vial transport medium  Result Value Ref Range Status   SARS Coronavirus 2 by RT PCR NEGATIVE NEGATIVE Final    Comment: (NOTE) SARS-CoV-2 target nucleic acids are NOT DETECTED.  The SARS-CoV-2 RNA is generally detectable in upper respiratory specimens during the acute phase of infection. The lowest concentration  of SARS-CoV-2 viral copies this assay can detect is 138 copies/mL. A negative result does not preclude SARS-Cov-2 infection and should not be used as the sole basis for treatment or other patient management decisions. A negative result may occur with  improper specimen collection/handling, submission of specimen other than nasopharyngeal swab, presence of viral mutation(s) within the areas targeted by this assay, and inadequate number of viral copies(<138 copies/mL). A negative result must be combined with clinical observations, patient history, and epidemiological information. The expected result is Negative.  Fact Sheet for Patients:  EntrepreneurPulse.com.au  Fact Sheet for Healthcare Providers:  IncredibleEmployment.be  This test is no t yet approved or cleared by the [place] FDA and  has been authorized for detection and/or diagnosis of SARS-CoV-2 by FDA under an Emergency Use Authorization (EUA). This EUA will remain  in effect (meaning this test can be used) for  the duration of the COVID-19 declaration under Section 564(b)(1) of the Act, 21 U.S.C.section 360bbb-3(b)(1), unless the authorization is terminated  or revoked sooner.       Influenza A by PCR NEGATIVE NEGATIVE Final   Influenza B by PCR NEGATIVE NEGATIVE Final    Comment: (NOTE) The Xpert Xpress SARS-CoV-2/FLU/RSV plus assay is intended as an aid in the diagnosis of influenza from Nasopharyngeal swab specimens and should not be used as a sole basis for treatment. Nasal washings and aspirates are unacceptable for Xpert Xpress SARS-CoV-2/FLU/RSV testing.  Fact Sheet for Patients: EntrepreneurPulse.com.au  Fact Sheet for Healthcare Providers: IncredibleEmployment.be  This test is not yet approved or cleared by the [place] FDA and has been authorized for detection and/or diagnosis of SARS-CoV-2 by FDA under an Emergency Use Authorization (EUA). This EUA will remain in effect (meaning this test can be used) for the duration of the COVID-19 declaration under Section 564(b)(1) of the Act, 21 U.S.C. section 360bbb-3(b)(1), unless the authorization is terminated or revoked.  Performed at [facility], [place] [address], [place], [place] [address]   MRSA PCR Screening     Status: None   Collection Time: 10/22/20  9:31 PM   Specimen: Nasal Mucosa; Nasopharyngeal  Result Value Ref Range Status   MRSA by PCR NEGATIVE NEGATIVE Final    Comment:        The GeneXpert MRSA Assay (FDA approved for NASAL specimens only), is one component of a comprehensive MRSA colonization surveillance program. It is not intended to diagnose MRSA infection nor to guide or monitor treatment for MRSA infections. Performed at [facility], [place] [address] [place], [place] [address]          Radiology Studies: No results found.      Scheduled Meds: . glycopyrrolate  0.1 mg Intravenous BID  . mouth rinse  15 mL  Mouth Rinse BID   Continuous Infusions: . morphine 5 mg/hr (10/24/20 1056)     LOS: 2 days    Time spent: 25 mins.More than 50% of that time was spent in counseling and/or coordination of care.      Shelly Coss, MD [facility] P12/05/2020, 11:47 AM

## 2020-10-24 NOTE — Plan of Care (Signed)
Patient has been in and out of atrial fibrillation, but HR >120 not sustained long enough to administer Metoprolol thus far. Pain and symptom management for anxiety provided as needed. Patient is intermittently confused to time, place, and situation. At times, this causes him to remove all external devices. Patient has not been combative thus far. Patient continues to wear BIPAP overnight.   Problem: Education: Goal: Knowledge of disease or condition will improve Outcome: Progressing Goal: Knowledge of the prescribed therapeutic regimen will improve Outcome: Progressing Goal: Individualized Educational Video(s) Outcome: Progressing   Problem: Activity: Goal: Ability to tolerate increased activity will improve Outcome: Progressing Goal: Will verbalize the importance of balancing activity with adequate rest periods Outcome: Progressing   Problem: Respiratory: Goal: Ability to maintain a clear airway will improve Outcome: Progressing Goal: Levels of oxygenation will improve Outcome: Progressing Goal: Ability to maintain adequate ventilation will improve Outcome: Progressing   Problem: Education: Goal: Knowledge of disease or condition will improve Outcome: Progressing Goal: Understanding of medication regimen will improve Outcome: Progressing Goal: Individualized Educational Video(s) Outcome: Progressing   Problem: Activity: Goal: Ability to tolerate increased activity will improve Outcome: Progressing   Problem: Cardiac: Goal: Ability to achieve and maintain adequate cardiopulmonary perfusion will improve Outcome: Progressing   Problem: Health Behavior/Discharge Planning: Goal: Ability to safely manage health-related needs after discharge will improve Outcome: Progressing

## 2020-10-24 NOTE — Progress Notes (Signed)
RN stated she had removed PT from BiPAP at approximately 1100 and placed on 10 LPM [place] high flow 02 device.

## 2020-10-25 NOTE — Progress Notes (Signed)
[place] [address] [employer] [facility]) Hospitalized Hospice Patient   Mr. Cottingham is a hospice patient with a terminal diagnosis of COPD. He was complaining of increasing abdominal pain and increasing SOB. The family decided to send him to the ED for further evaluation. Hospice was notified once he arrived at the ED. He is admitted with acute on chronic respiratory failure. This is a related hospital admission.  Visited with family at bedside.  Mr. Malizia now on palliative care floor under full comfort care, on 4L [place], morphine drip at 7mg /hr.  Cheyne-stokes resp noted with pauses of 15-20 seconds. No acute distress noted. Pt unresponsive with temporal wasting noted. Daughter reports one episode of pt "coming to" abruptly and being very present, then dropping back into unresponsiveness.  Education offered on signs and symptoms of end of life.  Normalized feelings of grief and validated caregiving. Report exchanged with bedside RN who questioned use of supplemental oxygen. This RN discussed pt's chronic long term dependence on supplemental O2 and that withdrawing O2 would not be in line with comfort care as pt is back to baseline with O2 requirements.  At RN's request spoke with attending MD Dr. Georgena Spurling who verbalized agreement with continuing comfort care for an expected hospital death. V/S: 98.7 axillary, 130/93, 105 HR, 18 RR, 90% 4L  Labs: no new labs Diagnostics: no new scans/film  IV/PRN Meds:  now on continuous morphine infusion @7mg /hr; Ativan 1mg  PRN x 1 dose, robinul 0.1 mg PIV BID Problem List: - acute on chronic respiratory failure - end stage COPD, stabilized on BiPAP  - end stage COPD - under hospice care, now full comfort care  GOC: comfort at EOL in hospital setting D/C planning: hospital death anticipated Family: present and updated  IDT: hospice team updated  Chrislyn Edison Pace, BSN, RN [place] (listed on AMION under Hospice/[facility])    [phone]

## 2020-10-25 NOTE — Progress Notes (Signed)
Patient resting comfortably in bed, pillows under legs and head repositioned, pillows under bilateral hips to help reduce pressure points, morphine infusing well into right forearm iv site, no acute distress noted, multiple bruises noted to bilateral arms, patient suctioned with minimal return, secretions are deep, robinul given to help, peri care done, edema noted to the scrotum and penis, large condom cath applied after skin prep done, mouth care preformed with rinse and gel, pt remained non-verbal throughout, bed locked and low, multiple family members just arrived to spend time with Andrew [name], will continue to monitor and round at least q2 hrs but family instructed to call for anything at all, emotional support given to patient and family.

## 2020-10-25 NOTE — Progress Notes (Signed)
PROGRESS NOTE    Andrew [name]  GGY:[id number] DOB: Apr 25, 1941 DOA: 11/03/2020 PCP: Lujean Amel, MD   Brief Narrative: Andrew [name] is a 79 y.o. 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  Patient presented secondary to abdominal pain was found to have acute diverticulitis in addition to acute COPD exacerbation.  Patient was initially treated aggressively with BiPAP, diltiazem drip, high flow oxygen.  During hospitalization, decision was made to transition to full comfort measures.   Assessment & Plan:   Principal Problem:   Acute on chronic respiratory failure with hypoxia (HCC) Active Problems:   End stage COPD ([place])   Current chronic use of systemic steroids   Hospice care patient   Major hemoptysis   Acute diverticulitis   Atrial fibrillation with RVR (HCC)   Acute on chronic respiratory failure with hypoxia Appears to be secondary to COPD exacerbation and end-stage COPD.  Patient was initially managed on BiPAP in addition to IV Solu-Medrol.  Patient failed to wean appropriately and decision was made to transition to full comfort measures.  Currently weaned down to home oxygen.  Morphine IV initiated for comfort.  COPD exacerbation End-stage COPD Symptom and treatment management as mentioned above.  Patient was previously enrolled with hospice at home.  Now comfort measures as mentioned above.  Acute diverticulitis Mild disease seen on CT abdomen/pelvis.  Patient started on Zosyn IV for treatment.  Now transition to full comfort measures and will manage pain with IV morphine.  Atrial fibrillation with RVR Patient initially managed on diltiazem drip which was transitioned to oral Cardizem.  Hemoptysis Occurred during hospitalization while at the [facility].  No recurrent hemoptysis.   DVT prophylaxis: Comfort measures Code Status:   Code Status:  DNR Family Communication: Multiple sons, daughters, granddaughter, in-laws at bedside Disposition Plan: Anticipate hospital death in 1 to 3 days   Consultants:   None  Procedures:   BiPAP  Antimicrobials:  Zosyn IV   Subjective: Patient is comfortable lying in bed.  No distress.  Objective: Vitals:   10/24/20 1816 10/24/20 2001 10/24/20 2257 10/25/20 0924  BP: 133/77  116/72 (!) 130/93  Pulse: 72  67 (!) 105  Resp: 16  15 18   Temp:   98 F (36.7 C) 98.7 F (37.1 C)  TempSrc:   Axillary   SpO2: 99% 98% 96% 90%  Weight:      Height:        Intake/Output Summary (Last 24 hours) at 10/25/2020 1636 Last data filed at 10/25/2020 1100 Gross per 24 hour  Intake 111 ml  Output 400 ml  Net -289 ml   Filed Weights   10/22/20 2200  Weight: 55.7 kg    Examination:  General exam: Appears calm and comfortable Respiratory system: Respiratory effort normal.   Data Reviewed: I have personally reviewed following labs and imaging studies  CBC Lab Results  Component Value Date   WBC 15.0 (H) 10/23/2020   RBC 3.71 (L) 10/23/2020   HGB 12.0 (L) 10/23/2020   HCT 38.2 (L) 10/23/2020   MCV 103.0 (H) 10/23/2020   MCH 32.3 10/23/2020   PLT 190 10/23/2020   MCHC 31.4 10/23/2020   RDW 13.0 10/23/2020   LYMPHSABS 0.6 (L) 10/23/2020   MONOABS 0.7 10/23/2020   EOSABS 0.0 10/23/2020   BASOSABS 0.0 03/50/0938     Last metabolic panel Lab Results  Component Value Date   NA 140 10/23/2020  K 4.9 10/23/2020   CL 99 10/23/2020   CO2 26 10/23/2020   BUN 57 (H) 10/23/2020   CREATININE 1.49 (H) 10/23/2020   GLUCOSE 154 (H) 10/23/2020   GFRNONAA 47 (L) 10/23/2020   GFRAA >60 07/18/2020   CALCIUM 9.0 10/23/2020   PHOS 2.5 12/30/2018   PROT 6.9 10/30/2020   ALBUMIN 4.3 11/03/2020   BILITOT 0.9 10/25/2020   ALKPHOS 69 11/06/2020   AST 24 10/29/2020   ALT 31 11/11/2020   ANIONGAP 15 10/23/2020    CBG (last 3)  No results for input(s): GLUCAP in the last 72 hours.    GFR: Estimated Creatinine Clearance: 30.6 mL/min (A) (by C-G formula based on SCr of 1.49 mg/dL (H)).  Coagulation Profile: No results for input(s): INR, PROTIME in the last 168 hours.  Recent Results (from the past 240 hour(s))  Resp Panel by RT-PCR (Flu A&B, Covid) Nasopharyngeal Swab     Status: None   Collection Time: 10/22/20 12:03 AM   Specimen: Nasopharyngeal Swab; Nasopharyngeal(NP) swabs in vial transport medium  Result Value Ref Range Status   SARS Coronavirus 2 by RT PCR NEGATIVE NEGATIVE Final    Comment: (NOTE) SARS-CoV-2 target nucleic acids are NOT DETECTED.  The SARS-CoV-2 RNA is generally detectable in upper respiratory specimens during the acute phase of infection. The lowest concentration of SARS-CoV-2 viral copies this assay can detect is 138 copies/mL. A negative result does not preclude SARS-Cov-2 infection and should not be used as the sole basis for treatment or other patient management decisions. A negative result may occur with  improper specimen collection/handling, submission of specimen other than nasopharyngeal swab, presence of viral mutation(s) within the areas targeted by this assay, and inadequate number of viral copies(<138 copies/mL). A negative result must be combined with clinical observations, patient history, and epidemiological information. The expected result is Negative.  Fact Sheet for Patients:  EntrepreneurPulse.com.au  Fact Sheet for Healthcare Providers:  IncredibleEmployment.be  This test is no t yet approved or cleared by the [place] FDA and  has been authorized for detection and/or diagnosis of SARS-CoV-2 by FDA under an Emergency Use Authorization (EUA). This EUA will remain  in effect (meaning this test can be used) for the duration of the COVID-19 declaration under Section 564(b)(1) of the Act, 21 U.S.C.section 360bbb-3(b)(1), unless the authorization is terminated  or revoked  sooner.       Influenza A by PCR NEGATIVE NEGATIVE Final   Influenza B by PCR NEGATIVE NEGATIVE Final    Comment: (NOTE) The Xpert Xpress SARS-CoV-2/FLU/RSV plus assay is intended as an aid in the diagnosis of influenza from Nasopharyngeal swab specimens and should not be used as a sole basis for treatment. Nasal washings and aspirates are unacceptable for Xpert Xpress SARS-CoV-2/FLU/RSV testing.  Fact Sheet for Patients: EntrepreneurPulse.com.au  Fact Sheet for Healthcare Providers: IncredibleEmployment.be  This test is not yet approved or cleared by the [place] FDA and has been authorized for detection and/or diagnosis of SARS-CoV-2 by FDA under an Emergency Use Authorization (EUA). This EUA will remain in effect (meaning this test can be used) for the duration of the COVID-19 declaration under Section 564(b)(1) of the Act, 21 U.S.C. section 360bbb-3(b)(1), unless the authorization is terminated or revoked.  Performed at [facility], [place] [address], [place], [place] [address]   MRSA PCR Screening     Status: None   Collection Time: 10/22/20  9:31 PM   Specimen: Nasal Mucosa; Nasopharyngeal  Result Value Ref Range  Status   MRSA by PCR NEGATIVE NEGATIVE Final    Comment:        The GeneXpert MRSA Assay (FDA approved for NASAL specimens only), is one component of a comprehensive MRSA colonization surveillance program. It is not intended to diagnose MRSA infection nor to guide or monitor treatment for MRSA infections. Performed at [facility], [place] [address], [place], [place] [address]         Radiology Studies: No results found.      Scheduled Meds: . glycopyrrolate  0.1 mg Intravenous BID  . mouth rinse  15 mL Mouth Rinse BID   Continuous Infusions: . morphine 7 mg/hr (10/25/20 1201)     LOS: 3 days     Cordelia Poche, MD [facility] 10/25/2020, 4:36 PM  If  7PM-7AM, please contact night-coverage [email or web address]

## 2020-10-26 MED ORDER — SCOPOLAMINE 1 MG/3DAYS TD PT72
1.0000 | MEDICATED_PATCH | TRANSDERMAL | Status: DC
  Administered 2020-10-26: 1.5 mg via TRANSDERMAL
  Filled 2020-10-26: qty 1

## 2020-10-26 MED FILL — Morphine Sulfate-Sodium Chloride 0.9% IV Soln 100 MG/100ML: INTRAVENOUS | Qty: 100 | Status: AC

## 2020-10-26 NOTE — Plan of Care (Signed)
  Problem: Education: ?Goal: Knowledge of disease or condition will improve ?Outcome: Progressing ?Goal: Knowledge of the prescribed therapeutic regimen will improve ?Outcome: Progressing ?Goal: Individualized Educational Video(s) ?Outcome: Progressing ?  ?Problem: Activity: ?Goal: Ability to tolerate increased activity will improve ?Outcome: Progressing ?Goal: Will verbalize the importance of balancing activity with adequate rest periods ?Outcome: Progressing ?  ?Problem: Respiratory: ?Goal: Ability to maintain a clear airway will improve ?Outcome: Progressing ?Goal: Levels of oxygenation will improve ?Outcome: Progressing ?Goal: Ability to maintain adequate ventilation will improve ?Outcome: Progressing ?  ?Problem: Education: ?Goal: Knowledge of disease or condition will improve ?Outcome: Progressing ?Goal: Understanding of medication regimen will improve ?Outcome: Progressing ?Goal: Individualized Educational Video(s) ?Outcome: Progressing ?  ?Problem: Activity: ?Goal: Ability to tolerate increased activity will improve ?Outcome: Progressing ?  ?Problem: Cardiac: ?Goal: Ability to achieve and maintain adequate cardiopulmonary perfusion will improve ?Outcome: Progressing ?  ?Problem: Health Behavior/Discharge Planning: ?Goal: Ability to safely manage health-related needs after discharge will improve ?Outcome: Progressing ?  ?

## 2020-10-26 NOTE — Telephone Encounter (Signed)
See phone note from 11.30.2021. Pt was already contacted regarding message.   Left detailed message for pt that there is no other pending concerns to contact him with. I advised to call back if there are further issues he needs addressed. Will close encounter as nothing further is needed at this time.

## 2020-10-26 NOTE — Progress Notes (Signed)
[place] [address] [employer] [address] [facility]) Hospitalized Hospice Patient   Mr. Sandler is a hospice patient with a terminal diagnosis of COPD. He was complaining of increasing abdominal pain and increasing SOB. The family decided to send him to the ED for further evaluation. Hospice was notified once he arrived at the ED. He is admitted with acute on chronic respiratory failure. This is a related hospital admission.  Visited with family at bedside. Joint visit made with [place] LCSW Alfonse Alpers.  Mr. Gibbs continues to be under full comfort care, on 4L [place], morphine drip at 7mg /hr.  Cheyne-stokes resp noted with pauses of 20-35 seconds.  Pt unresponsive. Foley draining dark urine. Along with LCSW discussed what to expect at end of life.  Family asks that [employer] be used for funeral home services. Normalized feelings of grief and validated caregiving.  Report exchanged with bedside RN Joelene Millin.    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

## 2020-10-26 NOTE — Progress Notes (Signed)
PROGRESS NOTE    Andrew [name]  XHB:[id number] DOB: 01-26-1941 DOA: 11/13/2020 PCP: Lujean Amel, MD   Brief Narrative: Andrew [name] is a 79 y.o. 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  Patient presented secondary to abdominal pain was found to have acute diverticulitis in addition to acute COPD exacerbation.  Patient was initially treated aggressively with BiPAP, diltiazem drip, high flow oxygen.  During hospitalization, decision was made to transition to full comfort measures.   Assessment & Plan:   Principal Problem:   Acute on chronic respiratory failure with hypoxia (HCC) Active Problems:   End stage COPD ([place])   Current chronic use of systemic steroids   Hospice care patient   Major hemoptysis   Acute diverticulitis   Atrial fibrillation with RVR (HCC)   Acute on chronic respiratory failure with hypoxia Appears to be secondary to COPD exacerbation and end-stage COPD.  Patient was initially managed on BiPAP in addition to IV Solu-Medrol.  Patient failed to wean appropriately and decision was made to transition to full comfort measures.  Currently weaned down to home oxygen.  Morphine IV initiated for comfort.  COPD exacerbation End-stage COPD Symptom and treatment management as mentioned above.  Patient was previously enrolled with hospice at home.  Now comfort measures as mentioned above.  Acute diverticulitis Mild disease seen on CT abdomen/pelvis.  Patient started on Zosyn IV for treatment.  Now transition to full comfort measures and will manage pain with IV morphine.  Atrial fibrillation with RVR Patient initially managed on diltiazem drip which was transitioned to oral Cardizem.  Hemoptysis Occurred during hospitalization while at the [facility].  No recurrent hemoptysis.  Abdominal distension Likely urinary retention -Insert foley  catheter for comfort   DVT prophylaxis: Comfort measures Code Status:   Code Status: DNR Family Communication: Multiple family at bedside Disposition Plan: Anticipate hospital death in 1 to 3 days   Consultants:   None  Procedures:   BiPAP  Antimicrobials:  Zosyn IV   Subjective: No issues overnight. Aroused transiently for family this morning.  Objective: Vitals:   10/24/20 1816 10/24/20 2001 10/24/20 2257 10/25/20 0924  BP: 133/77  116/72 (!) 130/93  Pulse: 72  67 (!) 105  Resp: 16  15 18   Temp:   98 F (36.7 C) 98.7 F (37.1 C)  TempSrc:   Axillary   SpO2: 99% 98% 96% 90%  Weight:      Height:        Intake/Output Summary (Last 24 hours) at 10/26/2020 1001 Last data filed at 10/26/2020 0631 Gross per 24 hour  Intake 56.12 ml  Output 400 ml  Net -343.88 ml   Filed Weights   10/22/20 2200  Weight: 55.7 kg    Examination:  General exam: Appears calm and comfortable Respiratory system: Clear to auscultation. Irregular breaths with brief apneic periods Cardiovascular system: S1 & S2 heard, RRR. No murmurs, rubs, gallops or clicks. Gastrointestinal system: Abdomen with significant suprapubic distension, soft and seems nontender. Normal bowel sounds heard. Central nervous system: Asleep. Skin: No cyanosis. No rashes   Data Reviewed: I have personally reviewed following labs and imaging studies  CBC Lab Results  Component Value Date   WBC 15.0 (H) 10/23/2020   RBC 3.71 (L) 10/23/2020   HGB 12.0 (L) 10/23/2020   HCT 38.2 (L) 10/23/2020   MCV 103.0 (H) 10/23/2020   MCH 32.3 10/23/2020   PLT 190  10/23/2020   MCHC 31.4 10/23/2020   RDW 13.0 10/23/2020   LYMPHSABS 0.6 (L) 10/23/2020   MONOABS 0.7 10/23/2020   EOSABS 0.0 10/23/2020   BASOSABS 0.0 96/28/3662     Last metabolic panel Lab Results  Component Value Date   NA 140 10/23/2020   K 4.9 10/23/2020   CL 99 10/23/2020   CO2 26 10/23/2020   BUN 57 (H) 10/23/2020   CREATININE 1.49 (H)  10/23/2020   GLUCOSE 154 (H) 10/23/2020   GFRNONAA 47 (L) 10/23/2020   GFRAA >60 07/18/2020   CALCIUM 9.0 10/23/2020   PHOS 2.5 12/30/2018   PROT 6.9 10/24/2020   ALBUMIN 4.3 10/25/2020   BILITOT 0.9 10/30/2020   ALKPHOS 69 10/27/2020   AST 24 11/07/2020   ALT 31 10/30/2020   ANIONGAP 15 10/23/2020    CBG (last 3)  No results for input(s): GLUCAP in the last 72 hours.   GFR: Estimated Creatinine Clearance: 30.6 mL/min (A) (by C-G formula based on SCr of 1.49 mg/dL (H)).  Coagulation Profile: No results for input(s): INR, PROTIME in the last 168 hours.  Recent Results (from the past 240 hour(s))  Resp Panel by RT-PCR (Flu A&B, Covid) Nasopharyngeal Swab     Status: None   Collection Time: 10/22/20 12:03 AM   Specimen: Nasopharyngeal Swab; Nasopharyngeal(NP) swabs in vial transport medium  Result Value Ref Range Status   SARS Coronavirus 2 by RT PCR NEGATIVE NEGATIVE Final    Comment: (NOTE) SARS-CoV-2 target nucleic acids are NOT DETECTED.  The SARS-CoV-2 RNA is generally detectable in upper respiratory specimens during the acute phase of infection. The lowest concentration of SARS-CoV-2 viral copies this assay can detect is 138 copies/mL. A negative result does not preclude SARS-Cov-2 infection and should not be used as the sole basis for treatment or other patient management decisions. A negative result may occur with  improper specimen collection/handling, submission of specimen other than nasopharyngeal swab, presence of viral mutation(s) within the areas targeted by this assay, and inadequate number of viral copies(<138 copies/mL). A negative result must be combined with clinical observations, patient history, and epidemiological information. The expected result is Negative.  Fact Sheet for Patients:  EntrepreneurPulse.com.au  Fact Sheet for Healthcare Providers:  IncredibleEmployment.be  This test is no t yet approved or  cleared by the [place] FDA and  has been authorized for detection and/or diagnosis of SARS-CoV-2 by FDA under an Emergency Use Authorization (EUA). This EUA will remain  in effect (meaning this test can be used) for the duration of the COVID-19 declaration under Section 564(b)(1) of the Act, 21 U.S.C.section 360bbb-3(b)(1), unless the authorization is terminated  or revoked sooner.       Influenza A by PCR NEGATIVE NEGATIVE Final   Influenza B by PCR NEGATIVE NEGATIVE Final    Comment: (NOTE) The Xpert Xpress SARS-CoV-2/FLU/RSV plus assay is intended as an aid in the diagnosis of influenza from Nasopharyngeal swab specimens and should not be used as a sole basis for treatment. Nasal washings and aspirates are unacceptable for Xpert Xpress SARS-CoV-2/FLU/RSV testing.  Fact Sheet for Patients: EntrepreneurPulse.com.au  Fact Sheet for Healthcare Providers: IncredibleEmployment.be  This test is not yet approved or cleared by the [place] FDA and has been authorized for detection and/or diagnosis of SARS-CoV-2 by FDA under an Emergency Use Authorization (EUA). This EUA will remain in effect (meaning this test can be used) for the duration of the COVID-19 declaration under Section 564(b)(1) of the Act, 21 U.S.C. section  360bbb-3(b)(1), unless the authorization is terminated or revoked.  Performed at [facility], [place] [address], [place], [place] [address]   MRSA PCR Screening     Status: None   Collection Time: 10/22/20  9:31 PM   Specimen: Nasal Mucosa; Nasopharyngeal  Result Value Ref Range Status   MRSA by PCR NEGATIVE NEGATIVE Final    Comment:        The GeneXpert MRSA Assay (FDA approved for NASAL specimens only), is one component of a comprehensive MRSA colonization surveillance program. It is not intended to diagnose MRSA infection nor to guide or monitor treatment for MRSA infections. Performed at  [facility], [place] [address], [place], [place] [address]         Radiology Studies: No results found.      Scheduled Meds: . glycopyrrolate  0.1 mg Intravenous BID  . mouth rinse  15 mL Mouth Rinse BID  . scopolamine  1 patch Transdermal Q72H   Continuous Infusions: . morphine 7 mg/hr (10/26/20 0631)     LOS: 4 days     Cordelia Poche, MD [facility] 10/26/2020, 10:01 AM  If 7PM-7AM, please contact night-coverage [email or web address]

## 2020-10-26 NOTE — Progress Notes (Signed)
pts temp over 101, family refuses for pt to have tylenol susp, stating pt is comfort care and do not want pt to suffer thru getting a susp. Hospice nurse at pts bedside with family. Ativan given for agitation ( pt breathing heavy , calmer after ativan)

## 2020-10-27 DIAGNOSIS — R338 Other retention of urine: Secondary | ICD-10-CM

## 2020-10-27 NOTE — Progress Notes (Signed)
Andrew [name] [id number] [employer] [facility]) Hospitalized Hospice Patient   Andrew [name] is a hospice patient with a terminal diagnosis of COPD. He was complaining of increasing abdominal pain and increasing SOB. The family decided to send him to the ED for further evaluation. Hospice was notified once he arrived at the ED. He is admitted with acute on chronic respiratory failure. This is a related hospital admission.  Met with patient and family at bedside. Very appreciative of our care as well as hospital care. Andrew [name] is unresponsive and comfortable, he continues to be under full comfort care, on 4L [place], morphine drip at 20m/hr.  Cheyne-stokes resp noted with pauses of 20-35 seconds. Family asks that [employer] be used for funeral home services. Normalized feelings of grief and validated caregiving.  Report exchanged with bedside RN SNadyne Coombes   V/S: 101.5 rectally, 114/75, 101 HR, 14 RR, 89% 4L [place]-No current VS  Labs: no new labs I/O: 24/0? Diagnostics: no new scans/film  IV/PRN Meds:  titratable morphine infusion (5-164mhr) _0 /hr; Ativan 53m24mRN x 3 dose, robinul 0.1 mg PIV BID, scopolamine patch  Problem List:  Acute on chronic respiratory failure with hypoxia Appears to be secondary to COPD exacerbation and end-stage COPD.  Patient was initially managed on BiPAP in addition to IV Solu-Medrol.  Patient failed to wean appropriately and decision was made to transition to full comfort measures.  Currently weaned down to home oxygen.  Morphine IV initiated for comfort.  COPD exacerbation End-stage COPD Symptom and treatment management as mentioned above.  Patient was previously enrolled with hospice at home.  Now comfort measures as mentioned above.  Acute diverticulitis Mild disease seen on CT abdomen/pelvis.  Patient started on Zosyn IV for treatment.  Now transition to full comfort measures and will manage pain with IV morphine.  Atrial fibrillation with RVR Patient initially  managed on diltiazem drip which was transitioned to oral Cardizem.  [place].  Acute urinary retention Appears to have resolved with foley catheter -Continue foley catheter  Fever Comfort measures. Tylenol as needed if fever causes discomfort.   GOC: comfort at EOL in hospital setting D/C planning: hospital death anticipated Family: present and updated  IDT: hospice team updated  MelClementeen HoofSN, RN [facility] [phone]

## 2020-10-27 NOTE — Progress Notes (Signed)
PROGRESS NOTE    Andrew [name]  GLO:[id number] DOB: [birth year] DOA: 10/24/2020 PCP: Lujean Amel, MD   Brief Narrative: Andrew [name] is a 79 y.o. 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  Patient presented secondary to abdominal pain was found to have acute diverticulitis in addition to acute COPD exacerbation.  Patient was initially treated aggressively with BiPAP, diltiazem drip, high flow oxygen.  During hospitalization, decision was made to transition to full comfort measures.   Assessment & Plan:   Principal Problem:   Acute on chronic respiratory failure with hypoxia (HCC) Active Problems:   End stage COPD ([place])   Current chronic use of systemic steroids   Hospice care patient   Major hemoptysis   Acute diverticulitis   Atrial fibrillation with RVR (HCC)   Acute on chronic respiratory failure with hypoxia Appears to be secondary to COPD exacerbation and end-stage COPD.  Patient was initially managed on BiPAP in addition to IV Solu-Medrol.  Patient failed to wean appropriately and decision was made to transition to full comfort measures.  Currently weaned down to home oxygen.  Morphine IV initiated for comfort.  COPD exacerbation End-stage COPD Symptom and treatment management as mentioned above.  Patient was previously enrolled with hospice at home.  Now comfort measures as mentioned above.  Acute diverticulitis Mild disease seen on CT abdomen/pelvis.  Patient started on Zosyn IV for treatment.  Now transition to full comfort measures and will manage pain with IV morphine.  Atrial fibrillation with RVR Patient initially managed on diltiazem drip which was transitioned to oral Cardizem.  Hemoptysis Occurred during hospitalization while at the [place].  No recurrent hemoptysis.  Acute urinary retention Appears to have resolved with foley  catheter -Continue foley catheter  Fever Comfort measures. Tylenol as needed if fever causes discomfort.   DVT prophylaxis: Comfort measures Code Status:   Code Status: DNR Family Communication: Family sleeping at bedside Disposition Plan: Anticipate hospital death in 1 to 3 days   Consultants:   None  Procedures:   BiPAP  Antimicrobials:  Zosyn IV   Subjective: Fever overnight. No other issues noted.  Objective: Vitals:   10/26/20 0800 10/26/20 1408 10/26/20 1555 10/26/20 1800  BP:  114/75    Pulse:  (!) 101    Resp:  14    Temp:  (!) 101.3 F (38.5 C) 99.4 F (37.4 C) (!) 101.5 F (38.6 C)  TempSrc:  Oral Oral Axillary  SpO2: 94% (!) 89%    Weight:      Height:        Intake/Output Summary (Last 24 hours) at 10/27/2020 0806 Last data filed at 10/27/2020 1884 Gross per 24 hour  Intake 167.46 ml  Output 2000 ml  Net -1832.54 ml   Filed Weights   10/22/20 2200  Weight: 55.7 kg    Examination:  General exam: Appears calm and comfortable Respiratory system: Diminished but clear Cardiovascular system: S1 & S2 heard, Irregular rhythm with normal rate Gastrointestinal system: Abdomen is nondistended, soft and nontender. No organomegaly or masses felt. Normal bowel sounds heard. Central nervous system: Asleep Musculoskeletal: No edema. No calf tenderness   Data Reviewed: I have personally reviewed following labs and imaging studies  CBC Lab Results  Component Value Date   WBC 15.0 (H) 10/23/2020   RBC 3.71 (L) 10/23/2020   HGB 12.0 (L) 10/23/2020   HCT 38.2 (L) 10/23/2020   MCV 103.0 (  H) 10/23/2020   MCH 32.3 10/23/2020   PLT 190 10/23/2020   MCHC 31.4 10/23/2020   RDW 13.0 10/23/2020   LYMPHSABS 0.6 (L) 10/23/2020   MONOABS 0.7 10/23/2020   EOSABS 0.0 10/23/2020   BASOSABS 0.0 69/48/5462     Last metabolic panel Lab Results  Component Value Date   NA 140 10/23/2020   K 4.9 10/23/2020   CL 99 10/23/2020   CO2 26 10/23/2020   BUN  57 (H) 10/23/2020   CREATININE 1.49 (H) 10/23/2020   GLUCOSE 154 (H) 10/23/2020   GFRNONAA 47 (L) 10/23/2020   GFRAA >60 07/18/2020   CALCIUM 9.0 10/23/2020   PHOS 2.5 12/30/2018   PROT 6.9 10/20/2020   ALBUMIN 4.3 11/14/2020   BILITOT 0.9 10/31/2020   ALKPHOS 69 11/01/2020   AST 24 11/03/2020   ALT 31 11/02/2020   ANIONGAP 15 10/23/2020    CBG (last 3)  No results for input(s): GLUCAP in the last 72 hours.   GFR: Estimated Creatinine Clearance: 30.6 mL/min (A) (by C-G formula based on SCr of 1.49 mg/dL (H)).  Coagulation Profile: No results for input(s): INR, PROTIME in the last 168 hours.  Recent Results (from the past 240 hour(s))  Resp Panel by RT-PCR (Flu A&B, Covid) Nasopharyngeal Swab     Status: None   Collection Time: 10/22/20 12:03 AM   Specimen: Nasopharyngeal Swab; Nasopharyngeal(NP) swabs in vial transport medium  Result Value Ref Range Status   SARS Coronavirus 2 by RT PCR NEGATIVE NEGATIVE Final    Comment: (NOTE) SARS-CoV-2 target nucleic acids are NOT DETECTED.  The SARS-CoV-2 RNA is generally detectable in upper respiratory specimens during the acute phase of infection. The lowest concentration of SARS-CoV-2 viral copies this assay can detect is 138 copies/mL. A negative result does not preclude SARS-Cov-2 infection and should not be used as the sole basis for treatment or other patient management decisions. A negative result may occur with  improper specimen collection/handling, submission of specimen other than nasopharyngeal swab, presence of viral mutation(s) within the areas targeted by this assay, and inadequate number of viral copies(<138 copies/mL). A negative result must be combined with clinical observations, patient history, and epidemiological information. The expected result is Negative.  Fact Sheet for Patients:  EntrepreneurPulse.com.au  Fact Sheet for Healthcare Providers:   IncredibleEmployment.be  This test is no t yet approved or cleared by the [place] FDA and  has been authorized for detection and/or diagnosis of SARS-CoV-2 by FDA under an Emergency Use Authorization (EUA). This EUA will remain  in effect (meaning this test can be used) for the duration of the COVID-19 declaration under Section 564(b)(1) of the Act, 21 U.S.C.section 360bbb-3(b)(1), unless the authorization is terminated  or revoked sooner.       Influenza A by PCR NEGATIVE NEGATIVE Final   Influenza B by PCR NEGATIVE NEGATIVE Final    Comment: (NOTE) The Xpert Xpress SARS-CoV-2/FLU/RSV plus assay is intended as an aid in the diagnosis of influenza from Nasopharyngeal swab specimens and should not be used as a sole basis for treatment. Nasal washings and aspirates are unacceptable for Xpert Xpress SARS-CoV-2/FLU/RSV testing.  Fact Sheet for Patients: EntrepreneurPulse.com.au  Fact Sheet for Healthcare Providers: IncredibleEmployment.be  This test is not yet approved or cleared by the [place] FDA and has been authorized for detection and/or diagnosis of SARS-CoV-2 by FDA under an Emergency Use Authorization (EUA). This EUA will remain in effect (meaning this test can be used) for the duration of the  COVID-19 declaration under Section 564(b)(1) of the Act, 21 U.S.C. section 360bbb-3(b)(1), unless the authorization is terminated or revoked.  Performed at [facility], [place] [address], [place], [place] [address]   MRSA PCR Screening     Status: None   Collection Time: 10/22/20  9:31 PM   Specimen: Nasal Mucosa; Nasopharyngeal  Result Value Ref Range Status   MRSA by PCR NEGATIVE NEGATIVE Final    Comment:        The GeneXpert MRSA Assay (FDA approved for NASAL specimens only), is one component of a comprehensive MRSA colonization surveillance program. It is not intended to diagnose  MRSA infection nor to guide or monitor treatment for MRSA infections. Performed at [facility], [place] [address], [place], [place] [address]         Radiology Studies: No results found.      Scheduled Meds: . glycopyrrolate  0.1 mg Intravenous BID  . mouth rinse  15 mL Mouth Rinse BID  . scopolamine  1 patch Transdermal Q72H   Continuous Infusions: . morphine 7 mg/hr (10/27/20 9532)     LOS: 5 days     Cordelia Poche, MD [facility] 10/27/2020, 8:06 AM  If 7PM-7AM, please contact night-coverage [email or web address]

## 2020-10-27 NOTE — Plan of Care (Signed)
Plan of care discussed with the family.  The patient is unable to participate in plan of care d/t minimal responsiveness.  Comfort Care.

## 2020-11-18 NOTE — Death Summary Note (Signed)
DEATH SUMMARY   Patient Details  Name: Andrew [name] MRN: [id number] DOB: 20-Oct-1941  Admission/Discharge Information   Admit Date:  11/18/2020  Date of Death: Date of Death: Nov 25, 2020  Time of Death: Time of Death: 06-Mar-1006  Length of Stay: February 19, 2023  Referring Physician: Lujean Amel, MD   Reason(s) for Hospitalization  Acute respiratory failure with hypoxia  Diagnoses  Preliminary cause of death: Respiratory failure with hypoxia ([place]) Secondary Diagnoses (including complications and co-morbidities):  Principal Problem:   Acute on chronic respiratory failure with hypoxia ([place]) Active Problems:   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 year old male who presented secondary to abdominal pain and respiratory failure in setting of end-stage COPD and hemoptysis.  He is found to have associated acute diverticulitis.  Patient was initially managed on IV antibiotics, non-rebreather, Bi-PAP and high flow nasal cannula.  He also developed atrial fibrillation with rapid ventricular response managed on diltiazem drip.  While admitted, decision was made to transition to full comfort measures.  Patient was managed on morphine drip for pain management in addition to Ativan as needed for agitation.  Urinary Foley catheter placed for acute urinary retention and comfort.    Pertinent Labs and Studies  Significant Diagnostic Studies DG Chest 1 View  Result Date: 10/22/2020 CLINICAL DATA:  Coughing up blood EXAM: CHEST  1 VIEW COMPARISON:  07/10/2020 FINDINGS: Severe emphysematous changes are noted. Atelectasis and scarring is noted at the lung bases. There is no pneumothorax. The heart size is unremarkable. There is no acute  displaced fracture. There are old healed right-sided rib fractures. IMPRESSION: Severe emphysematous changes. No acute cardiopulmonary process. Electronically Signed   By: Constance Holster M.D.   On: 10/22/2020 02:11   DG Abdomen Acute W/Chest  Result Date: November 18, 2020 CLINICAL DATA:  Abdominal pain, tenderness EXAM: DG ABDOMEN ACUTE WITH 1 VIEW CHEST COMPARISON:  02/11/2020 FINDINGS: Supine and upright frontal views of the abdomen as well as an upright frontal view of the chest are obtained. The cardiac silhouette is unremarkable. Upper lobe predominant bullous emphysema with bibasilar scarring again noted. No airspace disease, effusion, or pneumothorax. Bowel gas pattern is unremarkable without obstruction or ileus. No free gas in the greater peritoneal sac. No abdominal masses or abnormal calcifications. No acute bony abnormalities. IMPRESSION: 1. Unremarkable bowel gas pattern. 2. Emphysema.  No acute airspace disease. Electronically Signed   By: Randa Ngo M.D.   On: 11-18-20 22:13   CT Angio Abd/Pel W and/or Wo Contrast  Result Date: 10/22/2020 CLINICAL DATA:  Concern for mesenteric ischemia.  Abdominal pain. EXAM: CTA ABDOMEN AND PELVIS WITHOUT AND WITH CONTRAST TECHNIQUE: Multidetector CT imaging of the abdomen and pelvis was performed using the standard protocol during bolus administration of intravenous contrast. Multiplanar reconstructed images and MIPs were obtained and reviewed to evaluate the vascular anatomy. CONTRAST:  124mL OMNIPAQUE IOHEXOL 350 MG/ML SOLN COMPARISON:  CT dated 02/17/2020 FINDINGS: VASCULAR Aorta: There are atherosclerotic changes of the abdominal aorta without evidence for an aneurysm or dissection. Celiac: There is mild narrowing at the origin of the celiac axis. SMA: Patent without evidence of aneurysm, dissection, vasculitis or significant stenosis. Renals: Both renal arteries are patent without evidence of aneurysm, dissection, vasculitis,  fibromuscular dysplasia  or significant stenosis. There are 2 right renal arteries. IMA: Patent without evidence of aneurysm, dissection, vasculitis or significant stenosis. Inflow: Patent without evidence of aneurysm, dissection, vasculitis or significant stenosis. Proximal Outflow: Bilateral common femoral and visualized portions of the superficial and profunda femoral arteries are patent without evidence of aneurysm, dissection, vasculitis or significant stenosis. Veins: No obvious venous abnormality within the limitations of this arterial phase study. Review of the MIP images confirms the above findings. NON-VASCULAR Lower chest: Severe emphysematous changes are noted. Mucous plugging is again noted involving the right lower lobe. Hepatobiliary: There is a small hyperattenuating focus in the right hepatic lobe measuring approximately 8 mm (axial series 7, image 39). This is favored to represent a flash hemangioma. Normal gallbladder.There is no biliary ductal dilation. Pancreas: Normal contours without ductal dilatation. No peripancreatic fluid collection. Spleen: There are multiple calcifications throughout the patient's spleen. Adrenals/Urinary Tract: --Adrenal glands: Unremarkable. --Right kidney/ureter: No hydronephrosis or radiopaque kidney stones. --Left kidney/ureter: No hydronephrosis or radiopaque kidney stones. --Urinary bladder: Unremarkable. Stomach/Bowel: --Stomach/Duodenum: No hiatal hernia or other gastric abnormality. Normal duodenal course and caliber. --Small bowel: Unremarkable. --Colon: There are scattered colonic diverticula. There is mild fat stranding about several diverticula involving the distal descending colon (axial series 7, image 137). --Appendix: Normal. Lymphatic: --again noted is a relatively stable enhancing lymph node adjacent to the left renal artery. This is not significantly changed from prior study. --No mesenteric lymphadenopathy. --No pelvic or inguinal lymphadenopathy. Reproductive: The prostate  gland is enlarged. Other: No ascites or free air. The abdominal wall is normal. Musculoskeletal. 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 IMPRESSION: 1. No CT evidence for mesenteric ischemia. 2. There is mild fat stranding about several diverticula involving the distal descending colon, suggestive of mild acute uncomplicated diverticulitis in the appropriate clinical setting. 3. Persistent mucous plugging involving the right lower lobe. 4. Prostatomegaly. Aortic Atherosclerosis (ICD10-I70.0). Electronically Signed   By: Constance Holster M.D.   On: 10/22/2020 01:14    Microbiology Recent Results (from the past 240 hour(s))  Resp Panel by RT-PCR (Flu A&B, Covid) Nasopharyngeal Swab     Status: None   Collection Time: 10/22/20 12:03 AM   Specimen: Nasopharyngeal Swab; Nasopharyngeal(NP) swabs in vial transport medium  Result Value Ref Range Status   SARS Coronavirus 2 by RT PCR NEGATIVE NEGATIVE Final    Comment: (NOTE) SARS-CoV-2 target nucleic acids are NOT DETECTED.  The SARS-CoV-2 RNA is generally detectable in upper respiratory specimens during the acute phase of infection. The lowest concentration of SARS-CoV-2 viral copies this assay can detect is 138 copies/mL. A negative result does not preclude SARS-Cov-2 infection and should not be used as the sole basis for treatment or other patient management decisions. A negative result may occur with  improper specimen collection/handling, submission of specimen other than nasopharyngeal swab, presence of viral mutation(s) within the areas targeted by this assay, and inadequate number of viral copies(<138 copies/mL). A negative result must be combined with clinical observations, patient history, and epidemiological information. The expected result is Negative.  Fact Sheet for Patients:  EntrepreneurPulse.com.au  Fact Sheet for Healthcare Providers:   IncredibleEmployment.be  This test is no t yet approved or cleared by the [place] FDA and  has been authorized for detection and/or diagnosis of SARS-CoV-2 by FDA under an Emergency Use Authorization (EUA). This EUA will remain  in effect (meaning this test can be used) for the duration  of the COVID-19 declaration under Section 564(b)(1) of the Act, 21 U.S.C.section 360bbb-3(b)(1), unless the authorization is terminated  or revoked sooner.       Influenza A by PCR NEGATIVE NEGATIVE Final   Influenza B by PCR NEGATIVE NEGATIVE Final    Comment: (NOTE) The Xpert Xpress SARS-CoV-2/FLU/RSV plus assay is intended as an aid in the diagnosis of influenza from Nasopharyngeal swab specimens and should not be used as a sole basis for treatment. Nasal washings and aspirates are unacceptable for Xpert Xpress SARS-CoV-2/FLU/RSV testing.  Fact Sheet for Patients: EntrepreneurPulse.com.au  Fact Sheet for Healthcare Providers: IncredibleEmployment.be  This test is not yet approved or cleared by the [place] FDA and has been authorized for detection and/or diagnosis of SARS-CoV-2 by FDA under an Emergency Use Authorization (EUA). This EUA will remain in effect (meaning this test can be used) for the duration of the COVID-19 declaration under Section 564(b)(1) of the Act, 21 U.S.C. section 360bbb-3(b)(1), unless the authorization is terminated or revoked.  Performed at [facility], [place] [address] [place], [place] [address]   MRSA PCR Screening     Status: None   Collection Time: 10/22/20  9:31 PM   Specimen: Nasal Mucosa; Nasopharyngeal  Result Value Ref Range Status   MRSA by PCR NEGATIVE NEGATIVE Final    Comment:        The GeneXpert MRSA Assay (FDA approved for NASAL specimens only), is one component of a comprehensive MRSA colonization surveillance program. It is not intended to diagnose  MRSA infection nor to guide or monitor treatment for MRSA infections. Performed at [facility], [place] [address], [place], [place] [address]     Lab Basic Metabolic Panel: Recent Labs  Lab 11/11/2020 2213 10/23/20 0829  NA 139 140  K 4.6 4.9  CL 99 99  CO2 26 26  GLUCOSE 122* 154*  BUN 22 57*  CREATININE 0.96 1.49*  CALCIUM 9.2 9.0   Liver Function Tests: Recent Labs  Lab 11/17/2020 2213  AST 24  ALT 31  ALKPHOS 69  BILITOT 0.9  PROT 6.9  ALBUMIN 4.3   Recent Labs  Lab 11/17/2020 2213  LIPASE 18   No results for input(s): AMMONIA in the last 168 hours. CBC: Recent Labs  Lab 11/10/2020 2213 10/22/20 1043 10/23/20 0829  WBC 12.6* 25.6* 15.0*  NEUTROABS 11.2* 24.3* 13.7*  HGB 13.5 13.1 12.0*  HCT 41.7 41.2 38.2*  MCV 100.2* 102.2* 103.0*  PLT 186 198 190   Cardiac Enzymes: No results for input(s): CKTOTAL, CKMB, CKMBINDEX, TROPONINI in the last 168 hours. Sepsis Labs: Recent Labs  Lab 10/22/2020 2213 10/22/20 1043 10/23/20 0829  WBC 12.6* 25.6* 15.0*    Procedures/Operations     Cordelia Poche 19-Nov-2020, 3:57 PM

## 2020-11-18 NOTE — Plan of Care (Signed)
Care plan reviewed with family.

## 2020-11-18 DEATH — deceased

## 2020-11-27 ENCOUNTER — Ambulatory Visit: Payer: Medicare Other | Admitting: Pulmonary Disease

## 2021-01-16 ENCOUNTER — Ambulatory Visit: Payer: Medicare Other | Admitting: Cardiovascular Disease
